# Patient Record
Sex: Female | Born: 1940
Health system: Southern US, Community
[De-identification: ages and names within clinical notes are randomized; demographics above are authoritative.]

## PROBLEM LIST (undated history)

## (undated) DIAGNOSIS — M1990 Unspecified osteoarthritis, unspecified site: Secondary | ICD-10-CM

## (undated) DIAGNOSIS — I10 Essential (primary) hypertension: Secondary | ICD-10-CM

## (undated) DIAGNOSIS — Z5189 Encounter for other specified aftercare: Secondary | ICD-10-CM

## (undated) DIAGNOSIS — M797 Fibromyalgia: Secondary | ICD-10-CM

## (undated) DIAGNOSIS — H269 Unspecified cataract: Secondary | ICD-10-CM

## (undated) DIAGNOSIS — N393 Stress incontinence (female) (male): Secondary | ICD-10-CM

## (undated) DIAGNOSIS — D689 Coagulation defect, unspecified: Secondary | ICD-10-CM

## (undated) DIAGNOSIS — J45909 Unspecified asthma, uncomplicated: Secondary | ICD-10-CM

## (undated) DIAGNOSIS — T7840XA Allergy, unspecified, initial encounter: Secondary | ICD-10-CM

## (undated) DIAGNOSIS — R55 Syncope and collapse: Secondary | ICD-10-CM

## (undated) DIAGNOSIS — E7800 Pure hypercholesterolemia, unspecified: Secondary | ICD-10-CM

## (undated) DIAGNOSIS — G588 Other specified mononeuropathies: Secondary | ICD-10-CM

## (undated) DIAGNOSIS — K5730 Diverticulosis of large intestine without perforation or abscess without bleeding: Secondary | ICD-10-CM

## (undated) DIAGNOSIS — K219 Gastro-esophageal reflux disease without esophagitis: Secondary | ICD-10-CM

## (undated) DIAGNOSIS — N390 Urinary tract infection, site not specified: Secondary | ICD-10-CM

## (undated) DIAGNOSIS — J984 Other disorders of lung: Secondary | ICD-10-CM

## (undated) DIAGNOSIS — D649 Anemia, unspecified: Secondary | ICD-10-CM

## (undated) DIAGNOSIS — D49519 Neoplasm of unspecified behavior of unspecified kidney: Secondary | ICD-10-CM

## (undated) DIAGNOSIS — I509 Heart failure, unspecified: Secondary | ICD-10-CM

## (undated) DIAGNOSIS — E559 Vitamin D deficiency, unspecified: Secondary | ICD-10-CM

## (undated) DIAGNOSIS — F419 Anxiety disorder, unspecified: Secondary | ICD-10-CM

## (undated) DIAGNOSIS — R911 Solitary pulmonary nodule: Secondary | ICD-10-CM

## (undated) DIAGNOSIS — E669 Obesity, unspecified: Secondary | ICD-10-CM

## (undated) DIAGNOSIS — I2510 Atherosclerotic heart disease of native coronary artery without angina pectoris: Secondary | ICD-10-CM

## (undated) DIAGNOSIS — R3129 Other microscopic hematuria: Secondary | ICD-10-CM

## (undated) DIAGNOSIS — D126 Benign neoplasm of colon, unspecified: Secondary | ICD-10-CM

## (undated) DIAGNOSIS — K5150 Left sided colitis without complications: Secondary | ICD-10-CM

## (undated) HISTORY — DX: Unspecified cataract: H26.9

## (undated) HISTORY — DX: Obesity, unspecified: E66.9

## (undated) HISTORY — DX: Anxiety disorder, unspecified: F41.9

## (undated) HISTORY — DX: Solitary pulmonary nodule: R91.1

## (undated) HISTORY — DX: Essential (primary) hypertension: I10

## (undated) HISTORY — DX: Encounter for other specified aftercare: Z51.89

## (undated) HISTORY — DX: Unspecified asthma, uncomplicated: J45.909

## (undated) HISTORY — DX: Stress incontinence (female) (male): N39.3

## (undated) HISTORY — PX: COLONOSCOPY: SHX174

## (undated) HISTORY — DX: Atherosclerotic heart disease of native coronary artery without angina pectoris: I25.10

## (undated) HISTORY — DX: Diverticulosis of large intestine without perforation or abscess without bleeding: K57.30

## (undated) HISTORY — DX: Coagulation defect, unspecified: D68.9

## (undated) HISTORY — DX: Unspecified osteoarthritis, unspecified site: M19.90

## (undated) HISTORY — DX: Other microscopic hematuria: R31.29

## (undated) HISTORY — DX: Allergy, unspecified, initial encounter: T78.40XA

## (undated) HISTORY — PX: POLYPECTOMY: SHX149

## (undated) HISTORY — DX: Pure hypercholesterolemia, unspecified: E78.00

## (undated) HISTORY — DX: Benign neoplasm of colon, unspecified: D12.6

## (undated) HISTORY — DX: Other disorders of lung: J98.4

## (undated) HISTORY — DX: Gastro-esophageal reflux disease without esophagitis: K21.9

## (undated) HISTORY — DX: Vitamin D deficiency, unspecified: E55.9

## (undated) HISTORY — PX: ABDOMINAL HYSTERECTOMY: SHX81

## (undated) HISTORY — PX: UPPER GASTROINTESTINAL ENDOSCOPY: SHX188

## (undated) HISTORY — DX: Left sided colitis without complications: K51.50

## (undated) HISTORY — DX: Other specified mononeuropathies: G58.8

## (undated) HISTORY — DX: Anemia, unspecified: D64.9

## (undated) HISTORY — DX: Heart failure, unspecified: I50.9

## (undated) HISTORY — DX: Fibromyalgia: M79.7

## (undated) HISTORY — PX: CHOLECYSTECTOMY: SHX55

## (undated) HISTORY — DX: Neoplasm of unspecified behavior of unspecified kidney: D49.519

---

## 1998-06-24 ENCOUNTER — Other Ambulatory Visit: Admission: RE | Admit: 1998-06-24 | Discharge: 1998-06-24 | Payer: Self-pay | Admitting: Obstetrics and Gynecology

## 1999-03-01 ENCOUNTER — Encounter (INDEPENDENT_AMBULATORY_CARE_PROVIDER_SITE_OTHER): Payer: Self-pay | Admitting: Specialist

## 1999-03-01 ENCOUNTER — Other Ambulatory Visit: Admission: RE | Admit: 1999-03-01 | Discharge: 1999-03-01 | Payer: Self-pay | Admitting: Gastroenterology

## 1999-03-24 ENCOUNTER — Encounter: Payer: Self-pay | Admitting: Surgery

## 1999-03-29 ENCOUNTER — Observation Stay (HOSPITAL_COMMUNITY): Admission: RE | Admit: 1999-03-29 | Discharge: 1999-03-30 | Payer: Self-pay | Admitting: Surgery

## 1999-03-29 ENCOUNTER — Encounter (INDEPENDENT_AMBULATORY_CARE_PROVIDER_SITE_OTHER): Payer: Self-pay

## 1999-08-07 ENCOUNTER — Other Ambulatory Visit: Admission: RE | Admit: 1999-08-07 | Discharge: 1999-08-07 | Payer: Self-pay | Admitting: Obstetrics and Gynecology

## 2000-04-30 HISTORY — PX: CORONARY STENT PLACEMENT: SHX1402

## 2000-08-21 ENCOUNTER — Ambulatory Visit (HOSPITAL_COMMUNITY): Admission: RE | Admit: 2000-08-21 | Discharge: 2000-08-21 | Payer: Self-pay | Admitting: Orthopedic Surgery

## 2000-08-21 ENCOUNTER — Encounter: Payer: Self-pay | Admitting: Orthopedic Surgery

## 2000-09-05 ENCOUNTER — Encounter: Payer: Self-pay | Admitting: Orthopedic Surgery

## 2000-09-05 ENCOUNTER — Encounter: Admission: RE | Admit: 2000-09-05 | Discharge: 2000-09-05 | Payer: Self-pay | Admitting: Orthopedic Surgery

## 2000-09-18 ENCOUNTER — Encounter: Payer: Self-pay | Admitting: Orthopedic Surgery

## 2000-09-18 ENCOUNTER — Encounter: Admission: RE | Admit: 2000-09-18 | Discharge: 2000-09-18 | Payer: Self-pay | Admitting: Orthopedic Surgery

## 2000-10-01 ENCOUNTER — Encounter: Admission: RE | Admit: 2000-10-01 | Discharge: 2000-10-01 | Payer: Self-pay | Admitting: Orthopedic Surgery

## 2000-10-01 ENCOUNTER — Encounter: Payer: Self-pay | Admitting: Orthopedic Surgery

## 2000-11-09 ENCOUNTER — Encounter: Payer: Self-pay | Admitting: Orthopedic Surgery

## 2000-11-09 ENCOUNTER — Ambulatory Visit (HOSPITAL_COMMUNITY): Admission: RE | Admit: 2000-11-09 | Discharge: 2000-11-09 | Payer: Self-pay | Admitting: Orthopedic Surgery

## 2001-01-15 ENCOUNTER — Encounter: Payer: Self-pay | Admitting: Orthopedic Surgery

## 2001-01-16 ENCOUNTER — Ambulatory Visit (HOSPITAL_COMMUNITY): Admission: RE | Admit: 2001-01-16 | Discharge: 2001-01-16 | Payer: Self-pay | Admitting: Orthopedic Surgery

## 2001-01-31 ENCOUNTER — Ambulatory Visit (HOSPITAL_COMMUNITY): Admission: RE | Admit: 2001-01-31 | Discharge: 2001-02-01 | Payer: Self-pay | Admitting: Cardiology

## 2001-02-07 ENCOUNTER — Inpatient Hospital Stay (HOSPITAL_COMMUNITY): Admission: AD | Admit: 2001-02-07 | Discharge: 2001-02-11 | Payer: Self-pay | Admitting: Cardiology

## 2002-01-05 ENCOUNTER — Encounter: Payer: Self-pay | Admitting: Internal Medicine

## 2002-01-05 ENCOUNTER — Inpatient Hospital Stay (HOSPITAL_COMMUNITY): Admission: EM | Admit: 2002-01-05 | Discharge: 2002-01-06 | Payer: Self-pay | Admitting: Emergency Medicine

## 2002-01-06 ENCOUNTER — Encounter: Payer: Self-pay | Admitting: Internal Medicine

## 2002-08-17 ENCOUNTER — Encounter: Payer: Self-pay | Admitting: Pulmonary Disease

## 2002-08-17 ENCOUNTER — Ambulatory Visit (HOSPITAL_COMMUNITY): Admission: RE | Admit: 2002-08-17 | Discharge: 2002-08-17 | Payer: Self-pay | Admitting: Pulmonary Disease

## 2002-09-30 ENCOUNTER — Inpatient Hospital Stay (HOSPITAL_COMMUNITY): Admission: AD | Admit: 2002-09-30 | Discharge: 2002-10-02 | Payer: Self-pay | Admitting: Cardiology

## 2003-05-04 ENCOUNTER — Inpatient Hospital Stay (HOSPITAL_COMMUNITY): Admission: RE | Admit: 2003-05-04 | Discharge: 2003-05-05 | Payer: Self-pay | Admitting: *Deleted

## 2003-07-23 ENCOUNTER — Emergency Department (HOSPITAL_COMMUNITY): Admission: EM | Admit: 2003-07-23 | Discharge: 2003-07-23 | Payer: Self-pay | Admitting: Emergency Medicine

## 2003-08-01 ENCOUNTER — Inpatient Hospital Stay (HOSPITAL_COMMUNITY): Admission: EM | Admit: 2003-08-01 | Discharge: 2003-08-09 | Payer: Self-pay | Admitting: Emergency Medicine

## 2003-11-24 ENCOUNTER — Inpatient Hospital Stay (HOSPITAL_COMMUNITY): Admission: EM | Admit: 2003-11-24 | Discharge: 2003-11-26 | Payer: Self-pay | Admitting: Emergency Medicine

## 2004-03-01 ENCOUNTER — Ambulatory Visit: Payer: Self-pay | Admitting: Pulmonary Disease

## 2004-03-28 ENCOUNTER — Ambulatory Visit: Payer: Self-pay | Admitting: Pulmonary Disease

## 2004-03-29 ENCOUNTER — Ambulatory Visit: Payer: Self-pay | Admitting: Cardiology

## 2004-04-10 ENCOUNTER — Ambulatory Visit: Payer: Self-pay | Admitting: Gastroenterology

## 2004-04-18 ENCOUNTER — Ambulatory Visit: Payer: Self-pay | Admitting: Gastroenterology

## 2004-05-25 ENCOUNTER — Ambulatory Visit: Payer: Self-pay | Admitting: Pulmonary Disease

## 2004-08-24 ENCOUNTER — Emergency Department (HOSPITAL_COMMUNITY): Admission: EM | Admit: 2004-08-24 | Discharge: 2004-08-24 | Payer: Self-pay | Admitting: Emergency Medicine

## 2004-08-25 ENCOUNTER — Ambulatory Visit: Payer: Self-pay | Admitting: Internal Medicine

## 2004-09-22 ENCOUNTER — Ambulatory Visit: Payer: Self-pay | Admitting: Pulmonary Disease

## 2005-01-22 ENCOUNTER — Ambulatory Visit: Payer: Self-pay | Admitting: Pulmonary Disease

## 2005-03-08 ENCOUNTER — Ambulatory Visit: Payer: Self-pay | Admitting: Cardiology

## 2005-03-08 ENCOUNTER — Emergency Department (HOSPITAL_COMMUNITY): Admission: EM | Admit: 2005-03-08 | Discharge: 2005-03-08 | Payer: Self-pay | Admitting: Emergency Medicine

## 2005-04-03 ENCOUNTER — Ambulatory Visit: Payer: Self-pay | Admitting: Cardiology

## 2005-05-23 ENCOUNTER — Ambulatory Visit: Payer: Self-pay | Admitting: Pulmonary Disease

## 2005-06-12 ENCOUNTER — Ambulatory Visit: Payer: Self-pay | Admitting: Cardiovascular Disease

## 2005-06-12 ENCOUNTER — Inpatient Hospital Stay (HOSPITAL_COMMUNITY): Admission: EM | Admit: 2005-06-12 | Discharge: 2005-06-14 | Payer: Self-pay | Admitting: Emergency Medicine

## 2005-06-22 ENCOUNTER — Ambulatory Visit: Payer: Self-pay | Admitting: Cardiology

## 2005-10-02 ENCOUNTER — Ambulatory Visit: Payer: Self-pay | Admitting: Pulmonary Disease

## 2005-10-12 ENCOUNTER — Ambulatory Visit: Payer: Self-pay | Admitting: Cardiology

## 2005-12-26 ENCOUNTER — Ambulatory Visit: Payer: Self-pay | Admitting: Gastroenterology

## 2006-01-08 ENCOUNTER — Ambulatory Visit: Payer: Self-pay | Admitting: Gastroenterology

## 2006-01-28 HISTORY — PX: HAND SURGERY: SHX662

## 2006-02-11 ENCOUNTER — Ambulatory Visit: Payer: Self-pay | Admitting: Pulmonary Disease

## 2006-02-14 ENCOUNTER — Encounter: Admission: RE | Admit: 2006-02-14 | Discharge: 2006-05-01 | Payer: Self-pay | Admitting: Orthopedic Surgery

## 2006-02-28 DIAGNOSIS — D126 Benign neoplasm of colon, unspecified: Secondary | ICD-10-CM

## 2006-02-28 HISTORY — DX: Benign neoplasm of colon, unspecified: D12.6

## 2006-03-25 ENCOUNTER — Ambulatory Visit: Payer: Self-pay | Admitting: Gastroenterology

## 2006-03-25 ENCOUNTER — Encounter (INDEPENDENT_AMBULATORY_CARE_PROVIDER_SITE_OTHER): Payer: Self-pay | Admitting: *Deleted

## 2006-04-16 ENCOUNTER — Ambulatory Visit: Payer: Self-pay | Admitting: Cardiology

## 2006-04-29 ENCOUNTER — Ambulatory Visit: Payer: Self-pay | Admitting: Pulmonary Disease

## 2006-04-30 DIAGNOSIS — K5150 Left sided colitis without complications: Secondary | ICD-10-CM

## 2006-04-30 HISTORY — DX: Left sided colitis without complications: K51.50

## 2006-06-27 ENCOUNTER — Ambulatory Visit: Payer: Self-pay | Admitting: Gastroenterology

## 2006-06-28 ENCOUNTER — Encounter: Payer: Self-pay | Admitting: Gastroenterology

## 2006-07-15 ENCOUNTER — Ambulatory Visit: Payer: Self-pay | Admitting: Gastroenterology

## 2006-08-13 ENCOUNTER — Ambulatory Visit: Payer: Self-pay | Admitting: Pulmonary Disease

## 2006-08-13 LAB — CONVERTED CEMR LAB
ALT: 8 units/L (ref 0–40)
Alkaline Phosphatase: 54 units/L (ref 39–117)
BUN: 10 mg/dL (ref 6–23)
Basophils Relative: 0.2 % (ref 0.0–1.0)
Bilirubin, Direct: 0.1 mg/dL (ref 0.0–0.3)
CO2: 31 meq/L (ref 19–32)
Calcium: 9.5 mg/dL (ref 8.4–10.5)
Cholesterol: 119 mg/dL (ref 0–200)
Eosinophils Absolute: 0.4 10*3/uL (ref 0.0–0.6)
GFR calc Af Amer: 207 mL/min
GFR calc non Af Amer: 171 mL/min
Glucose, Bld: 106 mg/dL — ABNORMAL HIGH (ref 70–99)
HDL: 55.9 mg/dL (ref >39.0)
Hemoglobin: 12.2 g/dL (ref 12.0–15.0)
Lymphocytes Relative: 26.8 % (ref 12.0–46.0)
MCV: 92.3 fL (ref 78.0–100.0)
Monocytes Absolute: 0.6 10*3/uL (ref 0.2–0.7)
Monocytes Relative: 6.5 % (ref 3.0–11.0)
Neutro Abs: 5.6 10*3/uL (ref 1.4–7.7)
Platelets: 521 10*3/uL — ABNORMAL HIGH (ref 150–400)
Potassium: 3.6 meq/L (ref 3.5–5.1)
Pro B Natriuretic peptide (BNP): 16 pg/mL (ref 0.0–100.0)
TSH: 0.82 microintl units/mL (ref 0.35–5.50)
Total Protein: 7.4 g/dL (ref 6.0–8.3)
Triglycerides: 58 mg/dL (ref 0–149)

## 2006-08-26 ENCOUNTER — Ambulatory Visit: Payer: Self-pay | Admitting: Gastroenterology

## 2006-09-03 ENCOUNTER — Inpatient Hospital Stay (HOSPITAL_COMMUNITY): Admission: EM | Admit: 2006-09-03 | Discharge: 2006-09-04 | Payer: Self-pay | Admitting: Emergency Medicine

## 2006-09-03 ENCOUNTER — Ambulatory Visit: Payer: Self-pay | Admitting: Cardiology

## 2006-09-13 ENCOUNTER — Ambulatory Visit: Payer: Self-pay

## 2006-09-18 ENCOUNTER — Ambulatory Visit: Payer: Self-pay | Admitting: Cardiology

## 2006-09-30 ENCOUNTER — Ambulatory Visit: Payer: Self-pay | Admitting: Pulmonary Disease

## 2006-11-04 ENCOUNTER — Ambulatory Visit: Payer: Self-pay | Admitting: Pulmonary Disease

## 2006-11-04 LAB — CONVERTED CEMR LAB
ALT: 7 units/L (ref 0–35)
AST: 14 units/L (ref 0–37)
Alkaline Phosphatase: 58 units/L (ref 39–117)
BUN: 8 mg/dL (ref 6–23)
Basophils Relative: 0.5 % (ref 0.0–1.0)
CO2: 31 meq/L (ref 19–32)
Calcium: 9.1 mg/dL (ref 8.4–10.5)
Chloride: 108 meq/L (ref 96–112)
Creatinine, Ser: 0.4 mg/dL (ref 0.4–1.2)
Eosinophils Relative: 5 % (ref 0.0–5.0)
Glucose, Bld: 104 mg/dL — ABNORMAL HIGH (ref 70–99)
Monocytes Relative: 6.7 % (ref 3.0–11.0)
Platelets: 526 10*3/uL — ABNORMAL HIGH (ref 150–400)
RDW: 13 % (ref 11.5–14.6)
Saturation Ratios: 14 % — ABNORMAL LOW (ref 20.0–50.0)
Total Bilirubin: 0.6 mg/dL (ref 0.3–1.2)
Total Protein: 6.9 g/dL (ref 6.0–8.3)
WBC: 10.1 10*3/uL (ref 4.5–10.5)

## 2006-11-06 ENCOUNTER — Ambulatory Visit: Payer: Self-pay | Admitting: Cardiology

## 2006-11-08 ENCOUNTER — Ambulatory Visit: Payer: Self-pay | Admitting: Gastroenterology

## 2006-11-11 ENCOUNTER — Ambulatory Visit (HOSPITAL_COMMUNITY): Admission: RE | Admit: 2006-11-11 | Discharge: 2006-11-11 | Payer: Self-pay | Admitting: Gastroenterology

## 2006-11-26 ENCOUNTER — Encounter: Payer: Self-pay | Admitting: Gastroenterology

## 2006-11-26 ENCOUNTER — Ambulatory Visit: Payer: Self-pay | Admitting: Gastroenterology

## 2006-12-13 ENCOUNTER — Ambulatory Visit: Payer: Self-pay | Admitting: Pulmonary Disease

## 2007-01-06 ENCOUNTER — Ambulatory Visit: Payer: Self-pay | Admitting: Gastroenterology

## 2007-03-19 DIAGNOSIS — M1990 Unspecified osteoarthritis, unspecified site: Secondary | ICD-10-CM | POA: Insufficient documentation

## 2007-03-19 DIAGNOSIS — F411 Generalized anxiety disorder: Secondary | ICD-10-CM | POA: Insufficient documentation

## 2007-03-19 DIAGNOSIS — IMO0001 Reserved for inherently not codable concepts without codable children: Secondary | ICD-10-CM | POA: Insufficient documentation

## 2007-03-19 DIAGNOSIS — K219 Gastro-esophageal reflux disease without esophagitis: Secondary | ICD-10-CM | POA: Insufficient documentation

## 2007-03-19 DIAGNOSIS — T7840XA Allergy, unspecified, initial encounter: Secondary | ICD-10-CM | POA: Insufficient documentation

## 2007-03-20 ENCOUNTER — Ambulatory Visit: Payer: Self-pay | Admitting: Pulmonary Disease

## 2007-03-20 LAB — CONVERTED CEMR LAB
ALT: 9 units/L (ref 0–35)
AST: 16 units/L (ref 0–37)
Albumin: 4.3 g/dL (ref 3.5–5.2)
Basophils Absolute: 0 10*3/uL (ref 0.0–0.1)
Calcium: 9.5 mg/dL (ref 8.4–10.5)
Chloride: 105 meq/L (ref 96–112)
Eosinophils Absolute: 0.3 10*3/uL (ref 0.0–0.6)
Eosinophils Relative: 3.3 % (ref 0.0–5.0)
GFR calc non Af Amer: 171 mL/min
Glucose, Bld: 120 mg/dL — ABNORMAL HIGH (ref 70–99)
Hgb A1c MFr Bld: 6.2 % — ABNORMAL HIGH (ref 4.6–6.0)
LDL Cholesterol: 60 mg/dL (ref 0–99)
MCV: 92.6 fL (ref 78.0–100.0)
Platelets: 479 10*3/uL — ABNORMAL HIGH (ref 150–400)
RBC: 3.87 M/uL (ref 3.87–5.11)
RDW: 12.8 % (ref 11.5–14.6)
Total CHOL/HDL Ratio: 2.2
Triglycerides: 58 mg/dL (ref 0–149)
WBC: 10.2 10*3/uL (ref 4.5–10.5)

## 2007-05-09 ENCOUNTER — Ambulatory Visit: Payer: Self-pay | Admitting: Pulmonary Disease

## 2007-05-09 ENCOUNTER — Telehealth: Payer: Self-pay | Admitting: Pulmonary Disease

## 2007-05-09 LAB — CONVERTED CEMR LAB
Ketones, ur: NEGATIVE mg/dL
Mucus, UA: NEGATIVE
Nitrite: NEGATIVE
Urobilinogen, UA: 0.2 (ref 0.0–1.0)
WBC, UA: NONE SEEN cells/hpf
pH: 5.5 (ref 5.0–8.0)

## 2007-05-12 ENCOUNTER — Encounter: Payer: Self-pay | Admitting: Pulmonary Disease

## 2007-05-13 LAB — CONVERTED CEMR LAB
Leukocytes, UA: NEGATIVE
Specific Gravity, Urine: 1.02 (ref 1.000–1.03)
Total Protein, Urine: NEGATIVE mg/dL
Urobilinogen, UA: 0.2 (ref 0.0–1.0)
pH: 5.5 (ref 5.0–8.0)

## 2007-05-27 ENCOUNTER — Ambulatory Visit: Payer: Self-pay | Admitting: Cardiology

## 2007-06-06 ENCOUNTER — Ambulatory Visit: Payer: Self-pay | Admitting: Cardiology

## 2007-06-06 LAB — CONVERTED CEMR LAB
BUN: 7 mg/dL (ref 6–23)
Calcium: 9.3 mg/dL (ref 8.4–10.5)
GFR calc Af Amer: 207 mL/min
GFR calc non Af Amer: 171 mL/min
Glucose, Bld: 118 mg/dL — ABNORMAL HIGH (ref 70–99)

## 2007-06-20 ENCOUNTER — Ambulatory Visit: Payer: Self-pay | Admitting: Cardiology

## 2007-06-25 ENCOUNTER — Encounter: Payer: Self-pay | Admitting: Pulmonary Disease

## 2007-07-18 ENCOUNTER — Ambulatory Visit: Payer: Self-pay | Admitting: Pulmonary Disease

## 2007-07-19 DIAGNOSIS — D649 Anemia, unspecified: Secondary | ICD-10-CM | POA: Insufficient documentation

## 2007-07-19 DIAGNOSIS — K5730 Diverticulosis of large intestine without perforation or abscess without bleeding: Secondary | ICD-10-CM | POA: Insufficient documentation

## 2007-07-19 DIAGNOSIS — D126 Benign neoplasm of colon, unspecified: Secondary | ICD-10-CM | POA: Insufficient documentation

## 2007-07-19 DIAGNOSIS — E669 Obesity, unspecified: Secondary | ICD-10-CM | POA: Insufficient documentation

## 2007-07-19 DIAGNOSIS — E119 Type 2 diabetes mellitus without complications: Secondary | ICD-10-CM | POA: Insufficient documentation

## 2007-07-19 DIAGNOSIS — J45909 Unspecified asthma, uncomplicated: Secondary | ICD-10-CM | POA: Insufficient documentation

## 2007-10-13 ENCOUNTER — Ambulatory Visit: Payer: Self-pay | Admitting: Cardiology

## 2007-10-13 ENCOUNTER — Inpatient Hospital Stay (HOSPITAL_COMMUNITY): Admission: EM | Admit: 2007-10-13 | Discharge: 2007-10-15 | Payer: Self-pay | Admitting: Emergency Medicine

## 2007-10-17 ENCOUNTER — Telehealth (INDEPENDENT_AMBULATORY_CARE_PROVIDER_SITE_OTHER): Payer: Self-pay | Admitting: *Deleted

## 2007-11-17 ENCOUNTER — Ambulatory Visit: Payer: Self-pay | Admitting: Pulmonary Disease

## 2007-11-18 ENCOUNTER — Ambulatory Visit: Payer: Self-pay | Admitting: Pulmonary Disease

## 2007-11-23 LAB — CONVERTED CEMR LAB: HDL: 49.2 mg/dL (ref >39.0)

## 2007-12-24 ENCOUNTER — Telehealth: Payer: Self-pay | Admitting: Gastroenterology

## 2007-12-24 ENCOUNTER — Ambulatory Visit: Payer: Self-pay | Admitting: Cardiology

## 2007-12-29 ENCOUNTER — Encounter: Payer: Self-pay | Admitting: Adult Health

## 2007-12-29 ENCOUNTER — Ambulatory Visit: Payer: Self-pay | Admitting: Internal Medicine

## 2007-12-29 LAB — CONVERTED CEMR LAB
Bilirubin Urine: NEGATIVE
Nitrite: NEGATIVE
Specific Gravity, Urine: 1.025 (ref 1.000–1.03)
Total Protein, Urine: NEGATIVE mg/dL
pH: 5.5 (ref 5.0–8.0)

## 2007-12-30 ENCOUNTER — Telehealth: Payer: Self-pay | Admitting: Adult Health

## 2007-12-31 ENCOUNTER — Telehealth: Payer: Self-pay | Admitting: Pulmonary Disease

## 2007-12-31 ENCOUNTER — Encounter: Payer: Self-pay | Admitting: Pulmonary Disease

## 2008-01-07 ENCOUNTER — Ambulatory Visit: Payer: Self-pay | Admitting: Cardiology

## 2008-01-07 LAB — CONVERTED CEMR LAB
CO2: 30 meq/L (ref 19–32)
Chloride: 107 meq/L (ref 96–112)
GFR calc non Af Amer: 132 mL/min
Potassium: 3.4 meq/L — ABNORMAL LOW (ref 3.5–5.1)
Sodium: 142 meq/L (ref 135–145)

## 2008-01-13 ENCOUNTER — Ambulatory Visit (HOSPITAL_COMMUNITY): Admission: RE | Admit: 2008-01-13 | Discharge: 2008-01-13 | Payer: Self-pay | Admitting: Urology

## 2008-01-13 ENCOUNTER — Encounter: Payer: Self-pay | Admitting: Urology

## 2008-01-19 ENCOUNTER — Telehealth: Payer: Self-pay | Admitting: Pulmonary Disease

## 2008-01-20 ENCOUNTER — Ambulatory Visit: Payer: Self-pay | Admitting: Gastroenterology

## 2008-01-23 ENCOUNTER — Encounter: Payer: Self-pay | Admitting: Pulmonary Disease

## 2008-01-28 ENCOUNTER — Encounter: Payer: Self-pay | Admitting: Pulmonary Disease

## 2008-03-09 ENCOUNTER — Ambulatory Visit: Payer: Self-pay | Admitting: Cardiology

## 2008-03-16 ENCOUNTER — Encounter: Payer: Self-pay | Admitting: Pulmonary Disease

## 2008-03-16 ENCOUNTER — Ambulatory Visit: Payer: Self-pay | Admitting: Pulmonary Disease

## 2008-03-16 DIAGNOSIS — D4959 Neoplasm of unspecified behavior of other genitourinary organ: Secondary | ICD-10-CM | POA: Insufficient documentation

## 2008-03-16 DIAGNOSIS — N39498 Other specified urinary incontinence: Secondary | ICD-10-CM | POA: Insufficient documentation

## 2008-03-23 ENCOUNTER — Telehealth (INDEPENDENT_AMBULATORY_CARE_PROVIDER_SITE_OTHER): Payer: Self-pay | Admitting: *Deleted

## 2008-03-26 ENCOUNTER — Telehealth (INDEPENDENT_AMBULATORY_CARE_PROVIDER_SITE_OTHER): Payer: Self-pay | Admitting: *Deleted

## 2008-06-03 ENCOUNTER — Telehealth (INDEPENDENT_AMBULATORY_CARE_PROVIDER_SITE_OTHER): Payer: Self-pay | Admitting: *Deleted

## 2008-06-03 ENCOUNTER — Telehealth: Payer: Self-pay | Admitting: Pulmonary Disease

## 2008-06-04 ENCOUNTER — Encounter: Payer: Self-pay | Admitting: Pulmonary Disease

## 2008-06-16 ENCOUNTER — Telehealth (INDEPENDENT_AMBULATORY_CARE_PROVIDER_SITE_OTHER): Payer: Self-pay | Admitting: *Deleted

## 2008-07-12 ENCOUNTER — Encounter: Payer: Self-pay | Admitting: Pulmonary Disease

## 2008-07-13 ENCOUNTER — Telehealth: Payer: Self-pay | Admitting: Pulmonary Disease

## 2008-07-15 ENCOUNTER — Ambulatory Visit: Payer: Self-pay | Admitting: Cardiology

## 2008-07-15 ENCOUNTER — Inpatient Hospital Stay (HOSPITAL_COMMUNITY): Admission: EM | Admit: 2008-07-15 | Discharge: 2008-07-16 | Payer: Self-pay | Admitting: Emergency Medicine

## 2008-07-15 DIAGNOSIS — J984 Other disorders of lung: Secondary | ICD-10-CM | POA: Insufficient documentation

## 2008-07-22 ENCOUNTER — Ambulatory Visit: Payer: Self-pay | Admitting: Cardiology

## 2008-07-26 ENCOUNTER — Ambulatory Visit: Payer: Self-pay | Admitting: Pulmonary Disease

## 2008-07-29 ENCOUNTER — Ambulatory Visit (HOSPITAL_COMMUNITY): Admission: RE | Admit: 2008-07-29 | Discharge: 2008-07-29 | Payer: Self-pay | Admitting: Pulmonary Disease

## 2008-08-10 DIAGNOSIS — F32A Depression, unspecified: Secondary | ICD-10-CM | POA: Insufficient documentation

## 2008-08-10 DIAGNOSIS — F329 Major depressive disorder, single episode, unspecified: Secondary | ICD-10-CM

## 2008-08-11 ENCOUNTER — Encounter: Payer: Self-pay | Admitting: Cardiology

## 2008-08-11 ENCOUNTER — Ambulatory Visit: Payer: Self-pay | Admitting: Cardiology

## 2008-08-23 ENCOUNTER — Ambulatory Visit: Payer: Self-pay | Admitting: Cardiology

## 2008-08-23 LAB — CONVERTED CEMR LAB
CO2: 31 meq/L (ref 19–32)
Glucose, Bld: 125 mg/dL — ABNORMAL HIGH (ref 70–99)
Potassium: 3.3 meq/L — ABNORMAL LOW (ref 3.5–5.1)
Sodium: 143 meq/L (ref 135–145)

## 2008-08-25 ENCOUNTER — Ambulatory Visit: Payer: Self-pay | Admitting: Cardiology

## 2008-10-12 ENCOUNTER — Telehealth: Payer: Self-pay | Admitting: Pulmonary Disease

## 2008-10-15 ENCOUNTER — Encounter: Payer: Self-pay | Admitting: Pulmonary Disease

## 2008-12-03 ENCOUNTER — Telehealth: Payer: Self-pay | Admitting: Gastroenterology

## 2008-12-06 ENCOUNTER — Ambulatory Visit: Payer: Self-pay | Admitting: Gastroenterology

## 2008-12-07 ENCOUNTER — Encounter: Payer: Self-pay | Admitting: Gastroenterology

## 2008-12-07 LAB — CONVERTED CEMR LAB
ALT: 8 units/L (ref 0–35)
Albumin: 4 g/dL (ref 3.5–5.2)
BUN: 15 mg/dL (ref 6–23)
Basophils Relative: 1 % (ref 0.0–3.0)
CO2: 32 meq/L (ref 19–32)
Calcium: 8.7 mg/dL (ref 8.4–10.5)
Chloride: 105 meq/L (ref 96–112)
Creatinine, Ser: 0.4 mg/dL (ref 0.4–1.2)
Eosinophils Absolute: 0.4 10*3/uL (ref 0.0–0.7)
GFR calc non Af Amer: 205.14 mL/min (ref >60)
Lymphocytes Relative: 21 % (ref 12.0–46.0)
MCHC: 34.1 g/dL (ref 30.0–36.0)
MCV: 92.7 fL (ref 78.0–100.0)
Monocytes Absolute: 0.6 10*3/uL (ref 0.1–1.0)
Neutrophils Relative %: 66.9 % (ref 43.0–77.0)
Platelets: 436 10*3/uL — ABNORMAL HIGH (ref 150.0–400.0)
Potassium: 3.3 meq/L — ABNORMAL LOW (ref 3.5–5.1)
RBC: 3.75 M/uL — ABNORMAL LOW (ref 3.87–5.11)
WBC: 8.9 10*3/uL (ref 4.5–10.5)

## 2008-12-14 ENCOUNTER — Ambulatory Visit: Payer: Self-pay | Admitting: Gastroenterology

## 2008-12-14 LAB — CONVERTED CEMR LAB
CO2: 32 meq/L (ref 19–32)
Calcium: 8.6 mg/dL (ref 8.4–10.5)
Creatinine, Ser: 0.4 mg/dL (ref 0.4–1.2)
Glucose, Bld: 111 mg/dL — ABNORMAL HIGH (ref 70–99)
Potassium: 4 meq/L (ref 3.5–5.1)
Sodium: 142 meq/L (ref 135–145)

## 2008-12-29 HISTORY — PX: ESOPHAGOGASTRODUODENOSCOPY: SHX1529

## 2009-01-06 ENCOUNTER — Encounter: Payer: Self-pay | Admitting: Gastroenterology

## 2009-01-06 ENCOUNTER — Ambulatory Visit: Payer: Self-pay | Admitting: Gastroenterology

## 2009-01-07 ENCOUNTER — Telehealth (INDEPENDENT_AMBULATORY_CARE_PROVIDER_SITE_OTHER): Payer: Self-pay

## 2009-01-07 ENCOUNTER — Telehealth (INDEPENDENT_AMBULATORY_CARE_PROVIDER_SITE_OTHER): Payer: Self-pay | Admitting: *Deleted

## 2009-01-10 ENCOUNTER — Encounter: Payer: Self-pay | Admitting: Gastroenterology

## 2009-01-11 ENCOUNTER — Encounter (INDEPENDENT_AMBULATORY_CARE_PROVIDER_SITE_OTHER): Payer: Self-pay | Admitting: *Deleted

## 2009-01-17 ENCOUNTER — Ambulatory Visit (HOSPITAL_COMMUNITY): Admission: RE | Admit: 2009-01-17 | Discharge: 2009-01-17 | Payer: Self-pay | Admitting: Gastroenterology

## 2009-01-17 ENCOUNTER — Emergency Department (HOSPITAL_COMMUNITY): Admission: EM | Admit: 2009-01-17 | Discharge: 2009-01-17 | Payer: Self-pay | Admitting: Emergency Medicine

## 2009-01-25 ENCOUNTER — Telehealth: Payer: Self-pay | Admitting: Gastroenterology

## 2009-01-26 ENCOUNTER — Ambulatory Visit: Payer: Self-pay | Admitting: Gastroenterology

## 2009-02-21 ENCOUNTER — Encounter: Payer: Self-pay | Admitting: Cardiology

## 2009-02-22 ENCOUNTER — Ambulatory Visit: Payer: Self-pay | Admitting: Cardiology

## 2009-02-28 ENCOUNTER — Ambulatory Visit: Payer: Self-pay | Admitting: Pulmonary Disease

## 2009-02-28 ENCOUNTER — Telehealth (INDEPENDENT_AMBULATORY_CARE_PROVIDER_SITE_OTHER): Payer: Self-pay | Admitting: *Deleted

## 2009-03-09 ENCOUNTER — Ambulatory Visit: Payer: Self-pay | Admitting: Gastroenterology

## 2009-03-10 ENCOUNTER — Encounter: Payer: Self-pay | Admitting: Gastroenterology

## 2009-03-11 ENCOUNTER — Encounter: Payer: Self-pay | Admitting: Gastroenterology

## 2009-03-11 LAB — CONVERTED CEMR LAB
Alkaline Phosphatase: 53 units/L (ref 39–117)
BUN: 12 mg/dL (ref 6–23)
Basophils Relative: 0.6 % (ref 0.0–3.0)
Eosinophils Relative: 3.7 % (ref 0.0–5.0)
Glucose, Bld: 118 mg/dL — ABNORMAL HIGH (ref 70–99)
HCT: 35.3 % — ABNORMAL LOW (ref 36.0–46.0)
IgA: 173 mg/dL (ref 68–378)
Lymphs Abs: 1.8 10*3/uL (ref 0.7–4.0)
MCV: 94 fL (ref 78.0–100.0)
Monocytes Absolute: 0.7 10*3/uL (ref 0.1–1.0)
Neutrophils Relative %: 71.3 % (ref 43.0–77.0)
RBC: 3.75 M/uL — ABNORMAL LOW (ref 3.87–5.11)
TSH: 1.26 microintl units/mL (ref 0.35–5.50)
Total Bilirubin: 0.6 mg/dL (ref 0.3–1.2)
WBC: 10 10*3/uL (ref 4.5–10.5)

## 2009-03-16 ENCOUNTER — Telehealth: Payer: Self-pay | Admitting: Gastroenterology

## 2009-04-11 ENCOUNTER — Telehealth: Payer: Self-pay | Admitting: Gastroenterology

## 2009-04-18 ENCOUNTER — Ambulatory Visit: Payer: Self-pay | Admitting: Gastroenterology

## 2009-05-25 ENCOUNTER — Encounter: Payer: Self-pay | Admitting: Gastroenterology

## 2009-05-27 ENCOUNTER — Encounter: Payer: Self-pay | Admitting: Gastroenterology

## 2009-05-27 ENCOUNTER — Encounter (INDEPENDENT_AMBULATORY_CARE_PROVIDER_SITE_OTHER): Payer: Self-pay | Admitting: *Deleted

## 2009-06-02 ENCOUNTER — Telehealth: Payer: Self-pay | Admitting: Pulmonary Disease

## 2009-06-02 ENCOUNTER — Ambulatory Visit: Payer: Self-pay | Admitting: Pulmonary Disease

## 2009-06-03 ENCOUNTER — Telehealth (INDEPENDENT_AMBULATORY_CARE_PROVIDER_SITE_OTHER): Payer: Self-pay | Admitting: *Deleted

## 2009-06-04 LAB — CONVERTED CEMR LAB
ALT: 13 units/L (ref 0–35)
Albumin: 4.4 g/dL (ref 3.5–5.2)
BUN: 10 mg/dL (ref 6–23)
Basophils Relative: 0.3 % (ref 0.0–3.0)
Chloride: 104 meq/L (ref 96–112)
Cholesterol: 104 mg/dL (ref 0–200)
Creatinine, Ser: 0.4 mg/dL (ref 0.4–1.2)
Eosinophils Relative: 4 % (ref 0.0–5.0)
Glucose, Bld: 108 mg/dL — ABNORMAL HIGH (ref 70–99)
Hgb A1c MFr Bld: 7.1 % — ABNORMAL HIGH (ref 4.6–6.5)
LDL Cholesterol: 40 mg/dL (ref 0–99)
Lymphocytes Relative: 21 % (ref 12.0–46.0)
MCV: 93.8 fL (ref 78.0–100.0)
Monocytes Relative: 4.8 % (ref 3.0–12.0)
Neutrophils Relative %: 69.9 % (ref 43.0–77.0)
Platelets: 523 10*3/uL — ABNORMAL HIGH (ref 150.0–400.0)
Pro B Natriuretic peptide (BNP): 29 pg/mL (ref 0.0–100.0)
RBC: 4.09 M/uL (ref 3.87–5.11)
Saturation Ratios: 17.3 % — ABNORMAL LOW (ref 20.0–50.0)
TSH: 0.91 microintl units/mL (ref 0.35–5.50)
Total Bilirubin: 0.8 mg/dL (ref 0.3–1.2)
Total Protein: 7.9 g/dL (ref 6.0–8.3)
Transferrin: 277.1 mg/dL (ref 212.0–360.0)
Triglycerides: 67 mg/dL (ref 0.0–149.0)
WBC: 10 10*3/uL (ref 4.5–10.5)

## 2009-06-08 ENCOUNTER — Telehealth: Payer: Self-pay | Admitting: Pulmonary Disease

## 2009-06-09 ENCOUNTER — Ambulatory Visit: Payer: Self-pay | Admitting: Pulmonary Disease

## 2009-06-14 ENCOUNTER — Telehealth: Payer: Self-pay | Admitting: Cardiology

## 2009-06-15 ENCOUNTER — Encounter: Payer: Self-pay | Admitting: Cardiology

## 2009-06-16 ENCOUNTER — Ambulatory Visit: Payer: Self-pay | Admitting: Pulmonary Disease

## 2009-06-25 ENCOUNTER — Emergency Department (HOSPITAL_COMMUNITY): Admission: EM | Admit: 2009-06-25 | Discharge: 2009-06-25 | Payer: Self-pay | Admitting: Emergency Medicine

## 2009-07-04 ENCOUNTER — Telehealth: Payer: Self-pay | Admitting: Pulmonary Disease

## 2009-07-07 ENCOUNTER — Telehealth: Payer: Self-pay | Admitting: Cardiology

## 2009-07-14 ENCOUNTER — Ambulatory Visit: Payer: Self-pay | Admitting: Cardiology

## 2009-08-08 ENCOUNTER — Encounter: Payer: Self-pay | Admitting: Pulmonary Disease

## 2009-08-10 ENCOUNTER — Telehealth: Payer: Self-pay | Admitting: Pulmonary Disease

## 2009-08-15 ENCOUNTER — Encounter: Payer: Self-pay | Admitting: Pulmonary Disease

## 2009-09-07 ENCOUNTER — Encounter: Payer: Self-pay | Admitting: Pulmonary Disease

## 2009-11-02 ENCOUNTER — Encounter: Payer: Self-pay | Admitting: Gastroenterology

## 2009-11-29 ENCOUNTER — Ambulatory Visit: Payer: Self-pay | Admitting: Pulmonary Disease

## 2009-12-03 DIAGNOSIS — E559 Vitamin D deficiency, unspecified: Secondary | ICD-10-CM | POA: Insufficient documentation

## 2010-01-04 ENCOUNTER — Telehealth: Payer: Self-pay | Admitting: Gastroenterology

## 2010-01-05 ENCOUNTER — Encounter: Payer: Self-pay | Admitting: Pulmonary Disease

## 2010-02-08 ENCOUNTER — Ambulatory Visit: Payer: Self-pay | Admitting: Gastroenterology

## 2010-02-10 ENCOUNTER — Encounter: Payer: Self-pay | Admitting: Gastroenterology

## 2010-02-10 LAB — CONVERTED CEMR LAB
BUN: 13 mg/dL (ref 6–23)
Creatinine, Ser: 0.5 mg/dL (ref 0.4–1.2)
GFR calc non Af Amer: 144.58 mL/min (ref >60)
Glucose, Bld: 127 mg/dL — ABNORMAL HIGH (ref 70–99)

## 2010-03-27 ENCOUNTER — Encounter: Payer: Self-pay | Admitting: Pulmonary Disease

## 2010-03-30 ENCOUNTER — Encounter: Payer: Self-pay | Admitting: Pulmonary Disease

## 2010-04-26 ENCOUNTER — Telehealth: Payer: Self-pay | Admitting: Pulmonary Disease

## 2010-05-14 ENCOUNTER — Emergency Department (HOSPITAL_COMMUNITY)
Admission: EM | Admit: 2010-05-14 | Discharge: 2010-05-14 | Payer: Self-pay | Source: Home / Self Care | Admitting: Emergency Medicine

## 2010-05-14 ENCOUNTER — Telehealth (INDEPENDENT_AMBULATORY_CARE_PROVIDER_SITE_OTHER): Payer: Self-pay | Admitting: *Deleted

## 2010-05-15 LAB — BASIC METABOLIC PANEL
BUN: 10 mg/dL (ref 6–23)
CO2: 26 mEq/L (ref 19–32)
Calcium: 8.9 mg/dL (ref 8.4–10.5)
Chloride: 103 mEq/L (ref 96–112)
Creatinine, Ser: 0.43 mg/dL (ref 0.4–1.2)
GFR calc Af Amer: >60 mL/min (ref >60)
GFR calc non Af Amer: >60 mL/min (ref >60)
Glucose, Bld: 167 mg/dL — ABNORMAL HIGH (ref 70–99)
Potassium: 3.3 mEq/L — ABNORMAL LOW (ref 3.5–5.1)
Sodium: 139 mEq/L (ref 135–145)

## 2010-05-15 LAB — URINE MICROSCOPIC-ADD ON

## 2010-05-15 LAB — POCT CARDIAC MARKERS
CKMB, poc: <1 ng/mL — ABNORMAL LOW (ref 1.0–8.0)
Myoglobin, poc: 24.7 ng/mL (ref 12–200)
Troponin i, poc: <0.05 ng/mL (ref 0.00–0.09)

## 2010-05-15 LAB — CBC
HCT: 36.3 % (ref 36.0–46.0)
Hemoglobin: 11.7 g/dL — ABNORMAL LOW (ref 12.0–15.0)
MCH: 30.1 pg (ref 26.0–34.0)
MCHC: 32.2 g/dL (ref 30.0–36.0)
MCV: 93.3 fL (ref 78.0–100.0)
Platelets: 436 10*3/uL — ABNORMAL HIGH (ref 150–400)
RBC: 3.89 MIL/uL (ref 3.87–5.11)
RDW: 14.1 % (ref 11.5–15.5)
WBC: 11 10*3/uL — ABNORMAL HIGH (ref 4.0–10.5)

## 2010-05-15 LAB — URINALYSIS, ROUTINE W REFLEX MICROSCOPIC
Bilirubin Urine: NEGATIVE
Hgb urine dipstick: NEGATIVE
Ketones, ur: NEGATIVE mg/dL
Nitrite: NEGATIVE
Protein, ur: NEGATIVE mg/dL
Specific Gravity, Urine: 1.01 (ref 1.005–1.030)
Urine Glucose, Fasting: NEGATIVE mg/dL
Urobilinogen, UA: 0.2 mg/dL (ref 0.0–1.0)
pH: 5.5 (ref 5.0–8.0)

## 2010-05-15 LAB — D-DIMER, QUANTITATIVE: D-Dimer, Quant: 0.39 ug/mL-FEU (ref 0.00–0.48)

## 2010-05-17 ENCOUNTER — Telehealth (INDEPENDENT_AMBULATORY_CARE_PROVIDER_SITE_OTHER): Payer: Self-pay | Admitting: *Deleted

## 2010-05-18 ENCOUNTER — Ambulatory Visit
Admission: RE | Admit: 2010-05-18 | Discharge: 2010-05-18 | Payer: Self-pay | Source: Home / Self Care | Attending: Pulmonary Disease | Admitting: Pulmonary Disease

## 2010-05-21 ENCOUNTER — Encounter: Payer: Self-pay | Admitting: Pulmonary Disease

## 2010-05-22 ENCOUNTER — Ambulatory Visit
Admission: RE | Admit: 2010-05-22 | Discharge: 2010-05-22 | Payer: Self-pay | Source: Home / Self Care | Attending: Pulmonary Disease | Admitting: Pulmonary Disease

## 2010-05-22 ENCOUNTER — Telehealth: Payer: Self-pay | Admitting: Pulmonary Disease

## 2010-06-01 NOTE — Medication Information (Signed)
Summary: [facility]  [facility]   Imported By: Phillis Knack 06/09/2009 11:40:17  _____________________________________________________________________  External Attachment:    Type:   Image     Comment:   External Document

## 2010-06-01 NOTE — Assessment & Plan Note (Signed)
Summary: Acute NP office visit - bronchitis   Copy to:  n/a Primary Provider/Referring Provider:  Teressa Lower, MD  CC:  c/o still SOB, wheezing, prod cough with white mucus that pt feels wheezing has worsened since last OV.  states will finish augmentin today, and has finished the pred taper and still taking the hydromet.Marland Kitchen  History of Present Illness: 70 y/o BF with  mult med problems including CAD, Asthma, HTN, Hyperlipidemia       ~  Jul09:  she is followed for cardiology by [facility] and he saw her in [place]- he increased her LASIX to 69mBid...  She was hosp in Jun09 w/ CP and had cath showing non-obstructive CAD w/ 25-40% lesions in all 3 vessels and prev stent in LAD patent, norm LVF...  Today she is c/o increased pain w/ her Fibromyalgia & DJD... she has Relafen 7532mid + Tramadol Prn + Parafon Prn... the Tramadol makes her feel spaced-out, therefore stopped... [facility] a [facility]...  ~  Nov09:  f/u visit doing OK-  she saw DrStark in Sep09 & stable Ulc Colitis on Lialda 2.4gm/d...  she saw DrWrenn on several occas w/ eval for stress incontinence, dysuria w/ microscopic hematuria-  Cysto w/ bladder bx was neg;  CT Abd + Sonar w/ 1.8cm lesion left upper pole ?etiology- poss tumor vs cyst (min enhancement, solid on sonar w/ min blood flow + 2 sm cysts also seen)... she had f/u DrKatz 11/09- doing well...  ~  July 26, 2008:  she is quite anxious about a 1.4cm nodule in LLL that showed up on Urology CTAbd 07/12/08... subseq developed some CP and was Hsop 3/18-19/10 by Cards- felt to be non-cardiac CP w/ neg EKGs & ENZs- no change in meds...  CT Chest 07/22/08 confirmed 1.4cm peripheral LLL nodule that wasn't present on CT in 2005 & no other lesions, +bronchiectasis in both LLs L>R, mod cardiomegaly...      ** we discussed proceeding w/ PET/CT for further eval...   February 28, 2009--Presents for an acute office visit. Complains of prod cough with  yellow mucus,  wheezing, increased SOB, sweats for last week, worse for 3 days. OTC not helping.    June 09, 2009 --Presents for an acute office visit. Complains of dyspnea, wheezing, dry cough for 2 days. Called in augmentin on day 2/7. Wheezing and cough are keeping her up. OTC not helping. Very tired from cough. She concerned this will turn into pneumonia. Wheezing is worse today. Denies chest pain,  orthopnea, hemoptysis, fever, n/v/d, edema, headache,recent travel  Was exposed to smoke that seemed to start symptoms, Cough is mainly, no signficant discolored mucus.   June 16, 2009 --Presents for 1 week follow up . Seen last week w/ AEAB flare . Tx / augmentin and steroid taper. She does feel some better butr stil cough/wheeizng. Wheezing is keeping her up. She feels tight. Cough is mainly dry now w/ scant clear mucus. Denies chest pain,   orthopnea, hemoptysis, fever, n/v/d, edema, headache,recent travel. Finished prednisone yesterday. Last day of Augmentin today.   Medications Prior to Update: 1)  Flonase 50 Mcg/act  Susp (Fluticasone Propionate) .... 2 Sprays in Each Nostril Two Times A Day... 2)  Singulair 10 Mg  Tabs (Montelukast Sodium) .... Take 1 Tablet By Mouth Once A Day 3)  Mucinex 600 Mg Xr12h-Tab (Guaifenesin) .... Take 2 Tab By Mouth Two Times A Day W/ Plenty of Fluids...Marland KitchenMarland Kitchen)  Albuterol Sulfate (2.5 Mg/80m) 0.083% Nebu (Albuterol Sulfate) .... Use in Nebulizer Up To Three Times Per Day As Needed For Wheezing... 5)  Plavix 75 Mg  Tabs (Clopidogrel Bisulfate) .... Take 1 Tablet By Mouth Once A Day 6)  Nitrostat 0.4 Mg Subl (Nitroglycerin) ..Marland Kitchen. 1 Tablet Under Tongue At Onset of Chest Pain; You May Repeat Every 5 Minutes For Up To 3 Doses. 7)  Isosorbide Dinitrate 30 Mg  Tabs (Isosorbide Dinitrate) .... Take 1/2 Tablet Once Daily 8)  Atenolol 50 Mg  Tabs (Atenolol) .... Take 1 Tablet By Mouth Once A Day 9)  Norvasc 10 Mg  Tabs (Amlodipine Besylate) .... Take 1 Tablet By Mouth Once  A Day 10)  Clonidine Hcl 0.1 Mg  Tabs (Clonidine Hcl) ..Marland Kitchen. 1 Tab By Mouth Two Times A Day... 11)  Lasix 40 Mg  Tabs (Furosemide) .... Take 2 Tablets in The Am, and Take 1 Tablet By Mouth in The Pm 12)  Potassium Chloride Crys Cr 20 Meq  Tbcr (Potassium Chloride Crys Cr) .... Take 1 Tab By Mouth Two Times A Day... 13)  Crestor 5 Mg Tabs (Rosuvastatin Calcium) .... Take 1 Tablet By Mouth Once A Day 14)  Metformin Hcl 500 Mg  Tabs (Metformin Hcl) ..Marland Kitchen. 1 Tab By Mouth Two Times A Day... 15)  Prilosec Otc 20 Mg Tbec (Omeprazole Magnesium) .... Take 1 Tab By Mouth Two Times A Day... 16)  Lialda 1.2 Gm Tbec (Mesalamine) .... Take 1 Tab By Mouth Two Times A Day... 17)  Rowasa 4 Gm Kit (Mesalamine-Cleanser) .... Insert 1 Enema Into Rectum At Bedtime 18)  Analpram-Hc 1-2.5 % Crea (Hydrocortisone Ace-Pramoxine) .... Apply A Small Amount To Affected Area  Twice A Day 19)  Nabumetone 750 Mg  Tabs (Nabumetone) .... Take 1 Tab By Mouth Two Times A Day W/ Food... 20)  Parafon Forte Dsc 500 Mg  Tabs (Chlorzoxazone) ..Marland Kitchen. 1 Tab By Mouth 4 Times Per Day As Needed For Muscle Spasm... 21)  Alprazolam 0.5 Mg  Tabs (Alprazolam) .... Take 1 Tablet By Mouth Three Times A Day Not To Exceed 3 Per Day 22)  Hydrocortisone 100 Mg/663mEnem (Hydrocortisone) .... Insert 1 Enema Into Rectum Every Morning. (Use Rowasa Suppositories At Bedtime). Pharmacy-Please D/c Rx For Hydrocortisone At Bedtime! 23)  Cortisporin-Tc 3.06-30-08-0.5 Mg/ml Susp (Neomycin-Colist-Hc-Thonzonium) .... 2 Drops in Affected Ear Twice A Day As Needed 24)  Promethazine Hcl 25 Mg Tabs (Promethazine Hcl) ...Marland Kitchen 1 By Mouth Every 4hrs As Needed For Nausea 25)  Vitamin D (Ergocalciferol) 50000 Unit Caps (Ergocalciferol) .... Take One Capsule By Mouth Once Every Week 26)  Augmentin 875-125 Mg Tabs (Amoxicillin-Pot Clavulanate) .... Take One Tablet By Mouth Two Times A Day Until Gone 27)  Hydromet 5-1.5 Mg/102m5102myrp (Hydrocodone-Homatropine) ....Marland Kitchen1-2 Tsp Every 4-6 Hr As  Needed Cough 28)  Prednisone 10 Mg Tabs (Prednisone) .... 4 Tabs For 2 Days, Then 3 Tabs For 2 Days, 2 Tabs For 2 Days, Then 1 Tab For 2 Days, Then Stop  Current Medications (verified): 1)  Flonase 50 Mcg/act  Susp (Fluticasone Propionate) .... 2 Sprays in Each Nostril Two Times A Day... 2)  Singulair 10 Mg  Tabs (Montelukast Sodium) .... Take 1 Tablet By Mouth Once A Day 3)  Mucinex 600 Mg Xr12h-Tab (Guaifenesin) .... Take 2 Tab By Mouth Two Times A Day W/ Plenty of Fluids... Marland KitchenMarland Kitchen  Albuterol Sulfate (2.5 Mg/3ml102m.083% Nebu (Albuterol Sulfate) .... Use in Nebulizer Up To Three Times Per Day As Needed For Wheezing... 5Marland KitchenMarland Kitchen  Plavix 75 Mg  Tabs (Clopidogrel Bisulfate) .... Take 1 Tablet By Mouth Once A Day 6)  Nitrostat 0.4 Mg Subl (Nitroglycerin) .Marland Kitchen.. 1 Tablet Under Tongue At Onset of Chest Pain; You May Repeat Every 5 Minutes For Up To 3 Doses. 7)  Isosorbide Dinitrate 30 Mg  Tabs (Isosorbide Dinitrate) .... Take 1/2 Tablet Once Daily 8)  Atenolol 50 Mg  Tabs (Atenolol) .... Take 1 Tablet By Mouth Once A Day 9)  Norvasc 10 Mg  Tabs (Amlodipine Besylate) .... Take 1 Tablet By Mouth Once A Day 10)  Clonidine Hcl 0.1 Mg  Tabs (Clonidine Hcl) .Marland Kitchen.. 1 Tab By Mouth Two Times A Day... 11)  Lasix 40 Mg  Tabs (Furosemide) .... Take 2 Tablets in The Am, and Take 1 Tablet By Mouth in The Pm 12)  Potassium Chloride Crys Cr 20 Meq  Tbcr (Potassium Chloride Crys Cr) .... Take 1 Tab By Mouth Two Times A Day... 13)  Crestor 5 Mg Tabs (Rosuvastatin Calcium) .... Take 1 Tablet By Mouth Once A Day 14)  Metformin Hcl 500 Mg  Tabs (Metformin Hcl) .Marland Kitchen.. 1 Tab By Mouth Two Times A Day... 15)  Prilosec Otc 20 Mg Tbec (Omeprazole Magnesium) .... Take 1 Tab By Mouth Two Times A Day... 16)  Lialda 1.2 Gm Tbec (Mesalamine) .... Take 1 Tab By Mouth Two Times A Day... 17)  Rowasa 4 Gm Kit (Mesalamine-Cleanser) .... Insert 1 Enema Into Rectum At Bedtime 18)  Analpram-Hc 1-2.5 % Crea (Hydrocortisone Ace-Pramoxine) .... Apply A Small  Amount To Affected Area  Twice A Day 19)  Nabumetone 750 Mg  Tabs (Nabumetone) .... Take 1 Tab By Mouth Two Times A Day W/ Food... 20)  Parafon Forte Dsc 500 Mg  Tabs (Chlorzoxazone) .Marland Kitchen.. 1 Tab By Mouth 4 Times Per Day As Needed For Muscle Spasm... 21)  Alprazolam 0.5 Mg  Tabs (Alprazolam) .... Take 1 Tablet By Mouth Three Times A Day Not To Exceed 3 Per Day 22)  Hydrocortisone 100 Mg/42m Enem (Hydrocortisone) .... Insert 1 Enema Into Rectum Every Morning. (Use Rowasa Suppositories At Bedtime). Pharmacy-Please D/c Rx For Hydrocortisone At Bedtime! 23)  Cortisporin-Tc 3.06-30-08-0.5 Mg/ml Susp (Neomycin-Colist-Hc-Thonzonium) .... 2 Drops in Affected Ear Twice A Day As Needed 24)  Promethazine Hcl 25 Mg Tabs (Promethazine Hcl) ..Marland Kitchen. 1 By Mouth Every 4hrs As Needed For Nausea 25)  Vitamin D (Ergocalciferol) 50000 Unit Caps (Ergocalciferol) .... Take One Capsule By Mouth Once Every Week 26)  Augmentin 875-125 Mg Tabs (Amoxicillin-Pot Clavulanate) .... Take One Tablet By Mouth Two Times A Day Until Gone 27)  Hydromet 5-1.5 Mg/524mSyrp (Hydrocodone-Homatropine) ...Marland Kitchen 1-2 Tsp Every 4-6 Hr As Needed Cough  Allergies (verified): 1)  ! Codeine 2)  ! Cipro 3)  ! Zithromax (Azithromycin) 4)  ! Tramadol Hcl (Tramadol Hcl) 5)  ! Levaquin (Levofloxacin) 6)  ! Asa 7)  ! Tylox  Past History:  Family History: Last updated: 0304-19-2010ather died age 68/ ASHD/ MI, pneumonia, arthritis Mother alive age 68/ severe parkinson's disease 2 Siblings- Bro died w/ sarcoid, DM Sis, GlTami Linage 2826as HBP, Chol, DM No FH of Colon Cancer, but father had colon polyps  Social History: Last updated: [id number] ex-smoker: smoked for 10-1576yrp to 2ppd, quit 1975 social alcohol not exercising retired  Risk Factors: Exercise: yes (01/20/2008)  Risk Factors: Smoking Status: quit (03/16/2008)  Review of Systems      See HPI  Vital Signs:  Patient profile:   68 58ar old  female Height:       62 inches Weight:      203.13 pounds O2 Sat:      95 % on Room air Temp:     99.0 degrees F oral Pulse rate:   84 / minute BP sitting:   122 / 82  (right arm) Cuff size:   large  Vitals Entered By: Parke Poisson CNA (June 16, 2009 11:12 AM)  O2 Flow:  Room air CC: c/o still SOB, wheezing, prod cough with white mucus that pt feels wheezing has worsened since last OV.  states will finish augmentin today, has finished the pred taper and still taking the hydromet. Is Patient Diabetic? Yes Comments Medications reviewed with patient Daytime contact number verified with patient. Parke Poisson CNA  June 16, 2009 11:11 AM    Physical Exam  Additional Exam:  WD, Obese, 70 y/o BF in NAD... GENERAL:  Alert & oriented; pleasant & cooperative... HEENT:  /AT, EOM-wnl, PERRLA, EACs-clear, TMs-wnl, NOSE-clear, THROAT-clear & wnl. NECK:  Supple w/ fairROM; no JVD; normal carotid impulses w/o bruits; no thyromegaly or nodules palpated; no lymphadenopathy. CHEST:  Coarse BS w/ faint exp wheezing ... +trigger points along trapezius area and chest wall. HEART:  Regular Rhythm; without murmurs/ rubs/ or gallops heard... ABDOMEN:  Obese, soft & nontender; normal bowel sounds; no organomegaly or masses detected. EXT: without deformities, mild arthritic changes; no varicose veins/ +venous insuffic/ tr edema. NEURO:  CN's intact;  no focal neuro deficits...     Impression & Recommendations:  Problem # 1:  BRONCHITIS, ACUTE (ICD-466.0) Slow to resolve exacerbation  REC:  Extend Prednisone for next week.  Begin Symbicort 160/4.21mg 2 puffs two times a day  Mucinex DM two times a day as needed cough/congestion Increase fluids  Hydromet as needed cough, may make you sleepy.  Please contact office for sooner follow up if symptoms do not improve or worsen  follow up Dr. NLenna Gilfordas scheduled.   Her updated medication list for this problem includes:    Singulair 10 Mg Tabs (Montelukast sodium)  ..Marland Kitchen.. Take 1 tablet by mouth once a day    Mucinex 600 Mg Xr12h-tab (Guaifenesin) ..Marland Kitchen.. Take 2 tab by mouth two times a day w/ plenty of fluids...    Albuterol Sulfate (2.5 Mg/370m 0.083% Nebu (Albuterol sulfate) ..... Use in nebulizer up to three times per day as needed for wheezing...    Augmentin 875-125 Mg Tabs (Amoxicillin-pot clavulanate) ...Marland Kitchen. Take one tablet by mouth two times a day until gone    Hydromet 5-1.5 Mg/69m64myrp (Hydrocodone-homatropine) ....Marland Kitchen 1-2 tsp every 4-6 hr as needed cough  Orders: Est. Patient Level IV ([id number] Added to Medication List This Visit: 1)  Prednisone 10 Mg Tabs (Prednisone) .... 4 tabs for 3 days, then 3 tabs for 3 days, 2 tabs for 3 days, then 1 tab for 3  days, then stop  Complete Medication List: 1)  Flonase 50 Mcg/act Susp (Fluticasone propionate) .... 2 sprays in each nostril two times a day... 2)  Singulair 10 Mg Tabs (Montelukast sodium) .... Take 1 tablet by mouth once a day 3)  Mucinex 600 Mg Xr12h-tab (Guaifenesin) .... Take 2 tab by mouth two times a day w/ plenty of fluids... Marland KitchenMarland Kitchen  Albuterol Sulfate (2.5 Mg/3ml47m.083% Nebu (Albuterol sulfate) .... Use in nebulizer up to three times per day as needed for wheezing... 5)  Plavix 75 Mg Tabs (Clopidogrel bisulfate) .... Take 1 tablet by mouth once a day 6)  Nitrostat 0.4 Mg Subl (Nitroglycerin) .Marland Kitchen.. 1 tablet under tongue at onset of chest pain; you may repeat every 5 minutes for up to 3 doses. 7)  Isosorbide Dinitrate 30 Mg Tabs (Isosorbide dinitrate) .... Take 1/2 tablet once daily 8)  Atenolol 50 Mg Tabs (Atenolol) .... Take 1 tablet by mouth once a day 9)  Norvasc 10 Mg Tabs (Amlodipine besylate) .... Take 1 tablet by mouth once a day 10)  Clonidine Hcl 0.1 Mg Tabs (Clonidine hcl) .Marland Kitchen.. 1 tab by mouth two times a day... 11)  Lasix 40 Mg Tabs (Furosemide) .... Take 2 tablets in the am, and take 1 tablet by mouth in the pm 12)  Potassium Chloride Crys Cr 20 Meq Tbcr (Potassium chloride  crys cr) .... Take 1 tab by mouth two times a day... 13)  Crestor 5 Mg Tabs (Rosuvastatin calcium) .... Take 1 tablet by mouth once a day 14)  Metformin Hcl 500 Mg Tabs (Metformin hcl) .Marland Kitchen.. 1 tab by mouth two times a day... 15)  Prilosec Otc 20 Mg Tbec (Omeprazole magnesium) .... Take 1 tab by mouth two times a day... 16)  Lialda 1.2 Gm Tbec (Mesalamine) .... Take 1 tab by mouth two times a day... 17)  Rowasa 4 Gm Kit (Mesalamine-cleanser) .... Insert 1 enema into rectum at bedtime 18)  Analpram-hc 1-2.5 % Crea (Hydrocortisone ace-pramoxine) .... Apply a small amount to affected area  twice a day 19)  Nabumetone 750 Mg Tabs (Nabumetone) .... Take 1 tab by mouth two times a day w/ food... 20)  Parafon Forte Dsc 500 Mg Tabs (Chlorzoxazone) .Marland Kitchen.. 1 tab by mouth 4 times per day as needed for muscle spasm... 21)  Alprazolam 0.5 Mg Tabs (Alprazolam) .... Take 1 tablet by mouth three times a day not to exceed 3 per day 22)  Hydrocortisone 100 Mg/44m Enem (Hydrocortisone) .... Insert 1 enema into rectum every morning. (use rowasa suppositories at bedtime). pharmacy-please d/c rx for hydrocortisone at bedtime! 23)  Cortisporin-tc 3.06-30-08-0.5 Mg/ml Susp (Neomycin-colist-hc-thonzonium) .... 2 drops in affected ear twice a day as needed 24)  Promethazine Hcl 25 Mg Tabs (Promethazine hcl) ..Marland Kitchen. 1 by mouth every 4hrs as needed for nausea 25)  Vitamin D (ergocalciferol) 50000 Unit Caps (Ergocalciferol) .... Take one capsule by mouth once every week 26)  Augmentin 875-125 Mg Tabs (Amoxicillin-pot clavulanate) .... Take one tablet by mouth two times a day until gone 27)  Hydromet 5-1.5 Mg/536mSyrp (Hydrocodone-homatropine) ...Marland Kitchen 1-2 tsp every 4-6 hr as needed cough 28)  Prednisone 10 Mg Tabs (Prednisone) .... 4 tabs for 3 days, then 3 tabs for 3 days, 2 tabs for 3 days, then 1 tab for 3  days, then stop  Patient Instructions: 1)  Extend Prednisone for next week.  2)  Begin Symbicort 160/4.81m73m2 puffs two times a  day  3)  Mucinex DM two times a day as needed cough/congestion 4)  Increase fluids  5)  Hydromet as needed cough, may make you sleepy.  6)  Please contact office for sooner follow up if symptoms do not improve or worsen  7)  follow up Dr. NadLenna Gilford scheduled.  Prescriptions: PREDNISONE 10 MG TABS (PREDNISONE) 4 tabs for 3 days, then 3 tabs for 3 days, 2 tabs for 3 days, then 1 tab for 3  days, then stop  #30 x 0   Entered and Authorized by:   TamRexene Edison   Signed by:   Gerritt Galentine NP on 06/16/2009   Method used:  Electronically to        [facility] [phone]* (retail)       [address]       [place], [place]  [address]       Ph: [phone] or [phone]       Fax: [phone]   RxID:   [id number]

## 2010-06-01 NOTE — Progress Notes (Signed)
Summary: changing crestor  Phone Note Other Incoming   Summary of Call: received letter from [facility] that pts Crestor 48m is not on their formulary list and she needs to be changed to simvastatin, lovastatin, or lipitor. Called and spoke w/pt she states she was on lipitor 133muntil March 2010 when she was changed to Crestor due to her insurance, will discuss w/Dr KaRon Parkernd let pt know HeKevan RosebushRN  July 07, 2009 4:40 PM   Follow-up for Phone Call        spoke with Ms SpLansdalehe has 4 tablets left; Dr KaRon Parkerill make a decision at her appt on Thurs in regards to her meds ShMignon PineRMPutneyMarch 14, 2011 11:37 AM

## 2010-06-01 NOTE — Assessment & Plan Note (Signed)
Summary: MED REFILLS/YF   History of Present Illness Visit Type: Follow-up Visit Primary GI MD: Joylene Igo MD [facility] Primary Provider: Teressa Lower, MD Requesting Provider: n/a Chief Complaint: chronic diarrhea, med refills History of Present Illness:   [name] returns today complaining of frequent loose bowel movements. She states she will have 3-4 loose bowel movements with small amounts of bleeding about every other day. Imodium is somewhat helpful in controlling her symptoms.   GI Review of Systems    Reports abdominal pain.     Location of  Abdominal pain: lower abdomen.    Denies acid reflux, belching, bloating, chest pain, dysphagia with liquids, dysphagia with solids, heartburn, loss of appetite, nausea, vomiting, vomiting blood, weight loss, and  weight gain.      Reports diarrhea.     Denies anal fissure, black tarry stools, change in bowel habit, constipation, diverticulosis, fecal incontinence, heme positive stool, hemorrhoids, irritable bowel syndrome, jaundice, light color stool, liver problems, rectal bleeding, and  rectal pain.   Current Medications (verified): 1)  Flonase 50 Mcg/act  Susp (Fluticasone Propionate) .... 2 Sprays in Each Nostril Two Times A Day... 2)  Singulair 10 Mg  Tabs (Montelukast Sodium) .... Take 1 Tablet By Mouth Once A Day 3)  Mucinex 600 Mg Xr12h-Tab (Guaifenesin) .... Take 2 Tab By Mouth Two Times A Day W/ Plenty of Fluids.Marland KitchenMarland Kitchen 4)  Albuterol Sulfate (2.5 Mg/80m) 0.083% Nebu (Albuterol Sulfate) .... Use in Nebulizer Up To Three Times Per Day As Needed For Wheezing... 5)  Plavix 75 Mg  Tabs (Clopidogrel Bisulfate) .... Take 1 Tablet By Mouth Once A Day 6)  Nitrostat 0.4 Mg Subl (Nitroglycerin) ..Marland Kitchen. 1 Tablet Under Tongue At Onset of Chest Pain; You May Repeat Every 5 Minutes For Up To 3 Doses. 7)  Isosorbide Mononitrate Cr 60 Mg Xr24h-Tab (Isosorbide Mononitrate) .... Take 1/2 Tablet By Mouth Daily 8)  Atenolol 50 Mg  Tabs (Atenolol) .... Take  1 Tablet By Mouth Once A Day 9)  Norvasc 10 Mg  Tabs (Amlodipine Besylate) .... Take 1 Tablet By Mouth Once A Day 10)  Clonidine Hcl 0.1 Mg  Tabs (Clonidine Hcl) ..Marland Kitchen. 1 Tab By Mouth Two Times A Day... 11)  Lasix 40 Mg  Tabs (Furosemide) .... Take 2 Tablets in The Am, and Take 1 Tablet By Mouth in The Pm 12)  Potassium Chloride Crys Cr 20 Meq  Tbcr (Potassium Chloride Crys Cr) .... Take 1 Tab By Mouth Two Times A Day... 13)  Lipitor 10 Mg Tabs (Atorvastatin Calcium) .... Take One Tablet By Mouth Daily. 14)  Metformin Hcl 500 Mg  Tabs (Metformin Hcl) ..Marland Kitchen. 1 Tab By Mouth Two Times A Day... 15)  Prilosec Otc 20 Mg Tbec (Omeprazole Magnesium) .... Take 1 Tab By Mouth Two Times A Day... 16)  Asacol 400 Mg Tbec (Mesalamine) .... Take 4 Tabs By Mouth Two Times A Day As Directed By DRafael Bihari.. Must Keep Appt For Any Further Refills! 17)  Analpram-Hc 1-2.5 % Crea (Hydrocortisone Ace-Pramoxine) .... Apply A Small Amount To Affected Area  Twice A Day 18)  Nabumetone 750 Mg  Tabs (Nabumetone) .... Take 1 Tab By Mouth Two Times A Day W/ Food... 19)  Parafon Forte Dsc 500 Mg  Tabs (Chlorzoxazone) ..Marland Kitchen. 1 Tab By Mouth 4 Times Per Day As Needed For Muscle Spasm... 20)  Alprazolam 0.5 Mg  Tabs (Alprazolam) .... Take 1 Tablet By Mouth Three Times A Day Not To Exceed 3 Per Day 21)  Promethazine Hcl  25 Mg Tabs (Promethazine Hcl) .Marland Kitchen.. 1 By Mouth Every 4hrs As Needed For Nausea 22)  Hydromet 5-1.5 Mg/59m Syrp (Hydrocodone-Homatropine) ..Marland Kitchen. 1-2 Tsp Every 4-6 Hr As Needed Cough 23)  Vitamin D3 5000 Unit Caps (Cholecalciferol) .... Take One Capsule By Mouth Once Daily...  Allergies (verified): 1)  ! Codeine 2)  ! Cipro 3)  ! Zithromax (Azithromycin) 4)  ! Tramadol Hcl (Tramadol Hcl) 5)  ! Levaquin (Levofloxacin) 6)  ! Asa 7)  ! Tylox  Past History:  Past Medical History: ALLERGY (ICD-995.3) ASTHMA (ICD-493.90) BRONCHIECTASIS (ICD-494.0) PULMONARY NODULE (ICD-518.89) HYPERTENSION (ICD-401.9) CORONARY ARTERY  DISEASE (ICD-414.00) CHEST PAIN, ATYPICAL (ISWF-093.23 DIASTOLIC HEART FAILURE, CHRONIC (ICD-428.32) HYPERCHOLESTEROLEMIA (ICD-272.0) DIABETES MELLITUS (ICD-250.00) OBESITY (ICD-278.00) GERD (ICD-530.81) DIVERTICULOSIS OF COLON (ICD-562.10) Hx of LEFT SIDED ULCERATIVE COLITIS (ICD-556.9), 2008 ADENOMATOUS COLONIC POLYPS (ICD-211.3) 02/2006 MICROSCOPIC HEMATURIA (ICD-599.72) NEOPLASM, KIDNEY (ICD-239.5) STRESS INCONTINENCE (ICD-788.39) DEGENERATIVE JOINT DISEASE (ICD-715.90) FIBROMYALGIA (ICD-729.1) VITAMIN D DEFICIENCY (ICD-268.9) ANXIETY (ICD-300.00) Hx of ANEMIA (ICD-285.9)  Past Surgical History: S/P hysterectomy S/P cholecystectomy S/P right hand surgery 10/07 by Dr. GAmedeo PlentyPTCA-Stent  Family History: Reviewed history from 07/26/2008 and no changes required. Father died age 3761w/ ASHD/ MI, pneumonia, arthritis Mother alive age 3867w/ severe parkinson's disease 2 Siblings- Bro died w/ sarcoid, DM Sis, GTami Lin age 3721has HBP, Chol, DM No FH of Colon Cancer, but father had colon polyps  Social History: Reviewed history from 11/29/2009 and no changes required. Divorced ex-smoker: smoked for 10-171yrup to 2ppd, quit 1975 social alcohol not exercising retired  Review of Systems       The pertinent positives and negatives are noted as above and in the HPI. All other ROS were reviewed and were negative.   Vital Signs:  Patient profile:   [age]ear old female Height:      62 inches Weight:      202 pounds BMI:     37.08 Pulse rate:   84 / minute Pulse rhythm:   regular BP sitting:   114 / 70  (left arm) Cuff size:   regular  Vitals Entered By: June McMurray CMA (ADeborra Medina(February 08, 2010 10:40 AM)  Physical Exam  General:  Well developed, well nourished, no acute distress. Head:  Normocephalic and atraumatic. Eyes:  PERRLA, no icterus. Ears:  Normal auditory acuity. Mouth:  No deformity or lesions, dentition normal. Neck:  Supple; no masses or  thyromegaly. Lungs:  Clear throughout to auscultation. Heart:  Regular rate and rhythm; no murmurs, rubs,  or bruits. Abdomen:  Soft, nontender and nondistended. No masses, hepatosplenomegaly or hernias noted. Normal bowel sounds. Msk:  Symmetrical with no gross deformities. Normal posture. Pulses:  Normal pulses noted. Extremities:  No clubbing, cyanosis, edema or deformities noted. Neurologic:  Alert and  oriented x4;  grossly normal neurologically. Cervical Nodes:  No significant cervical adenopathy. Inguinal Nodes:  No significant inguinal adenopathy. Psych:  Alert and cooperative. Normal mood and affect.  Impression & Recommendations:  Problem # 1:  ULCERATIVE COLITIS-LEFT SIDE (ICD-556.5) Increase Asacol to 1.6 g t.i.d. Begin Canasa suppositories for one month. If her symptoms do not come under adequate control she is advised to call within 4-6 weeks for further management plans. BMET for long-term ASA usage. Last BMET was in 05/2009. Orders: TLB-BMP (Basic Metabolic Panel-BMET) ([id number] Problem # 2:  GERD (ICD-530.81) Continue Prilosec 20 mg twice daily, and standard antireflux measures.  Problem # 3:  PERSONAL HX COLONIC POLYPS (ICD-V12.72) Prior history of adenomatous colon polyp and November  2007. Surveillance colonoscopy in November 2012.  Patient Instructions: 1)  Get your labs drawn today in the basement.  2)  Pick up your prescriptions from your pharmacy.  3)  Please schedule a follow-up appointment in 6 months. 4)  Copy sent to : Teressa Lower, MD 5)  The medication list was reviewed and reconciled.  All changed / newly prescribed medications were explained.  A complete medication list was provided to the patient / caregiver.  Prescriptions: CANASA 1000 MG SUPP (MESALAMINE) one suppository into rectum at bedtime  #30 x 0   Entered by:   Marlon Pel CMA ([place])   Authorized by:   Ladene Artist MD [facility]   Signed by:   Marlon Pel CMA ([place]) on  02/08/2010   Method used:   Electronically to        [facility]. [phone]* (retail)       [place], [place]  [address]       Ph: [phone] or [phone]       Fax: [phone]   RxID:   [id number] ASACOL 400 MG TBEC (MESALAMINE) take 4 tabs by mouth three times a day  #360 x 6   Entered by:   Marlon Pel CMA ([place])   Authorized by:   Ladene Artist MD [facility]   Signed by:   Marlon Pel CMA ([place]) on 02/08/2010   Method used:   Electronically to        [facility]. [phone]* (retail)       [place], [place]  [address]       Ph: [phone] or [phone]       Fax: [phone]   RxID:   [id number]   Appended Document: MED REFILLS/YF    Clinical Lists Changes  Observations: Added new observation of COLONNXTDUE: 03/2011 (02/08/2010 13:12)

## 2010-06-01 NOTE — Progress Notes (Signed)
Summary: ? shingles  Phone Note Call from Patient Call back at [phone]   Caller: Patient Call For: [name] Reason for Call: Talk to Nurse Summary of Call: Patient calling saying see was seen Thurs. by Dr. Lenna Gilford.  She was told to call if she developed a rash, which she has, because she might have shingles.   Initial call taken by: Mateo Flow,  May 22, 2010 8:31 AM  Follow-up for Phone Call        Pt c/o bumps on her left side and abdomen with pain and itching starting 05/19/10. Pt states she is not able to see the bumps.The pain is 8 of 10 with minimum relief taking Vicodin every 6 hours. Please advise. Thanks. Iran Planas CMA  May 22, 2010 10:32 AM  [facility] [address]  Allergies:  1)  ! Codeine 2)  ! Cipro 3)  ! Zithromax (Azithromycin) 4)  ! Tramadol Hcl (Tramadol Hcl) 5)  ! Levaquin (Levofloxacin) 6)  ! Asa 7)  ! Tylox  Additional Follow-up for Phone Call Additional follow up Details #1::        per SN---please have pt come in this afternoon at 1:45 to see SN---we have to look at these bumps to make sure what this is.  thanks [place]  May 22, 2010 10:41 AM     Additional Follow-up for Phone Call Additional follow up Details #2::    Pt informed, appointment scheduled. Iran Planas CMA  May 22, 2010 10:52 AM

## 2010-06-01 NOTE — Progress Notes (Signed)
Summary: PRESCRIPTION  Phone Note Call from Patient Call back at [facility] Phone [phone]   Caller: Patient Call For: DR. NADEL Summary of Call: PATIENT PHONED [place] RETURNED [place] AN ANTIBIOTIC AND SOME MAGIC MOUTH [place] [facility] [address] NOT [facility] NUMBER. PATIENT CAN BE REACHED AT [phone] Initial call taken by: Ozella Rocks,  April 26, 2010 3:44 PM  Follow-up for Phone Call        Spoke with patient; complains of sore throat, cough-non productive,congestin, hoarsenss,blowing out streaks of blood from nose, and sweats(no fever) since Monday 04-24-10; recently went to beach and came back with these symptoms. Pt would like MMW and Augmentin called to pharmacy. Please advise.Clayborne Dana CMA  April 26, 2010 3:51 PM   Additional Follow-up for Phone Call Additional follow up Details #1::        per SN---ok for pt to have augmentin 874m   #14  1 by mouth two times a day until gone and mmw  #4oz   1 tsp gargle and swallow four times daily with 1 refill.  pt is aware that [place] April 26, 2010 4:44 PM     New/Updated Medications: AUGMENTIN 875-125 MG TABS (AMOXICILLIN-POT CLAVULANATE) take one tablet by mouth two times a day until gone * MMW take one tsp gargle and swallow four times daily Prescriptions: MMW take one tsp gargle and swallow four times daily  #4 oz x 1   Entered by:   LElita BooneCMA   Authorized by:   SNoralee SpaceMD   Signed by:   LElita BooneCMA on 04/26/2010   Method used:   Telephoned to ...       [facility]  [address] #[phone] (retail)       [address]  [address]      Ph: [phone]      Fax: [phone]  RxID:   [id number] 875-125 MG TABS (AMOXICILLIN-POT CLAVULANATE) take one tablet by mouth two times a day until gone  #14 x 0   Entered by:   LElita BooneCMA  Authorized by:   SNoralee SpaceMD   Signed by:   LElita BooneCMA on 04/26/2010   Method used:   [facility]ronically to        [facility][phone] (retail)       [address]      [place]  [address]      Ph: [phone] [phone]      Fax: [phone]  RxID:   [id number]

## 2010-06-01 NOTE — Miscellaneous (Signed)
Summary: Changed Asacol HD to Asacol  Clinical Lists Changes  Medications: Changed medication from LIALDA 1.2 GM TBEC (MESALAMINE) take 1 tab by mouth two times a day... to ASACOL 400 MG TBEC (MESALAMINE) 4 tablet by mouth two times a day  Pt needs office visit for any further refills! - Signed Rx of ASACOL 400 MG TBEC (MESALAMINE) 4 tablet by mouth two times a day  Pt needs office visit for any further refills!;  #240 x 0;  Signed;  Entered by: Marlon Pel CMA (AAMA);  Authorized by: Ladene Artist MD [facility];  Method used: Electronically to [facility]*, [address], [place], [place]  [address], Ph: [phone] or [phone], Fax: [phone]    Prescriptions: ASACOL 400 MG TBEC (MESALAMINE) 4 tablet by mouth two times a day  Pt needs office visit for any further refills!  #240 x 0   Entered by:   Marlon Pel CMA ([place])   Authorized by:   Ladene Artist MD [facility]   Signed by:   Marlon Pel CMA ([place]) on 11/02/2009   Method used:   Electronically to        [facility]. [phone]* (retail)       [address]       [place], [place]  [address]       Ph: [phone] or [phone]       Fax: [phone]   RxID:   [id number]

## 2010-06-01 NOTE — Medication Information (Signed)
Summary: [employer]   Imported By: Jamelle Haring 07/29/2009 08:46:45  _____________________________________________________________________  External Attachment:    Type:   Image     Comment:   External Document

## 2010-06-01 NOTE — Medication Information (Signed)
Summary: [place]   Imported By: Bubba Hales 09/13/2009 09:32:08  _____________________________________________________________________  External Attachment:    Type:   Image     Comment:   External Document

## 2010-06-01 NOTE — Progress Notes (Signed)
Summary: refill meds --- crestor  Phone Note Refill Request Call back at Home Phone [phone] Message from:  Patient on June 14, 2009 9:01 AM  Refills Requested: Medication #1:  CRESTOR 5 MG TABS Take 1 tablet by mouth once a day [facility] [address] . pt is out [phone]   Method Requested: Fax to [facility] Initial call taken by: Neil Crouch,  June 14, 2009 9:02 AM  Follow-up for Phone Call        sent Rx -- pt aware Follow-up by: Mignon Pine, RMA,  June 14, 2009 4:34 PM    Prescriptions: CRESTOR 5 MG TABS (ROSUVASTATIN CALCIUM) Take 1 tablet by mouth once a day  #30 x 8   Entered by:   Mignon Pine, RMA   Authorized by:   Lorenza Evangelist, MD, [facility]   Signed by:   Mignon Pine, RMA on 06/14/2009   Method used:   Electronically to        [facility]. [place] (retail)       [address]       [place],   [address]       Ph: [phone] or [phone]       Fax: [phone]   RxID:   [id number]

## 2010-06-01 NOTE — Progress Notes (Signed)
Summary: rx  Phone Note Call from Patient Call back at [phone]   Caller: Patient Call For: Tammy P Reason for Call: Talk to Nurse Summary of Call: need eardrops and phenogran called in.  She was in yesterday and these were not called in for her. [facility] [address] Initial call taken by: Zigmund Gottron,  June 03, 2009 11:23 AM  Follow-up for Phone Call        ok to call in corticosoporin and phenergan 25 mg one every 4 hours #12 Follow-up by: Tanda Rockers MD,  June 03, 2009 12:26 PM    New/Updated Medications: CORTISPORIN-TC 3.06-30-08-0.5 MG/ML SUSP (NEOMYCIN-COLIST-HC-THONZONIUM) 2 drops in affected ear twice a day as needed PROMETHAZINE HCL 25 MG TABS (PROMETHAZINE HCL) 1 by mouth every 4hrs as needed for nausea Prescriptions: PROMETHAZINE HCL 25 MG TABS (PROMETHAZINE HCL) 1 by mouth every 4hrs as needed for nausea  #12 x 0   Entered by:   Maryann Conners CMA   Authorized by:   Noralee Space MD   Signed by:   Maryann Conners CMA on 06/03/2009   Method used:   Electronically to        [facility]. [phone]* (retail)       [place], [place]  [address]       Ph: [phone] or [phone]       Fax: [phone]   RxID:   [id number] CORTISPORIN-TC 3.06-30-08-0.5 MG/ML SUSP (NEOMYCIN-COLIST-HC-THONZONIUM) 2 drops in affected ear twice a day as needed  #1 x 0   Entered by:   Maryann Conners CMA   Authorized by:   Noralee Space MD   Signed by:   Maryann Conners CMA on 06/03/2009   Method used:   [facility]ronically to        [facility]  [address] [phone]* (retail)       [address]       [place], [place]  [address]       Ph: [phone] or [phone]       Fax: [phone]   RxID:   [id number]

## 2010-06-01 NOTE — Assessment & Plan Note (Signed)
Summary: Acute NP office visit   Copy to:  n/a Primary Provider/Referring Provider:  Teressa Lower, MD  CC:  dyspnea, wheezing, dry cough onset yesterday - denies f/c/s, and n/v.  was given augmentin via phone msg yesterday.Marland Kitchen  History of Present Illness: 70 y/o BF with  mult med problems including CAD, Asthma, HTN, Hyperlipidemia       ~  Jul09:  she is followed for cardiology by [facility] and he saw her in [place]- he increased her LASIX to 80mBid...  She was hosp in Jun09 w/ CP and had cath showing non-obstructive CAD w/ 25-40% lesions in all 3 vessels and prev stent in LAD patent, norm LVF...  Today she is c/o increased pain w/ her Fibromyalgia & DJD... she has Relafen 7542mid + Tramadol Prn + Parafon Prn... the Tramadol makes her feel spaced-out, therefore stopped... [facility] a [facility]  ~  Nov09:  f/u visit doing OK-  she saw DrStark in Sep09 & stable Ulc Colitis on Lialda 2.4gm/d...  she saw DrWrenn on several occas w/ eval for stress incontinence, dysuria w/ microscopic hematuria-  Cysto w/ bladder bx was neg;  CT Abd + Sonar w/ 1.8cm lesion left upper pole ?etiology- poss tumor vs cyst (min enhancement, solid on sonar w/ min blood flow + 2 sm cysts also seen)... she had f/u DrKatz 11/09- doing well...  ~  July 26, 2008:  she is quite anxious about a 1.4cm nodule in LLL that showed up on Urology CTAbd 07/12/08... subseq developed some CP and was Hsop 3/18-19/10 by Cards- felt to be non-cardiac CP w/ neg EKGs & ENZs- no change in meds...  CT Chest 07/22/08 confirmed 1.4cm peripheral LLL nodule that wasn't present on CT in 2005 & no other lesions, +bronchiectasis in both LLs L>R, mod cardiomegaly...      ** we discussed proceeding w/ PET/CT for further eval...   February 28, 2009--Presents for an acute office visit. Complains of prod cough with yellow mucus,  wheezing, increased SOB, sweats for last week, worse for 3 days. OTC not helping.     June 09, 2009 --Presents for an acute office visit. Complains of dyspnea, wheezing, dry cough for 2 days. Called in augmentin on day 2/7. Wheezing and cough are keeping her up. OTC not helping. Very tired from cough. She concerned this will turn into pneumonia. Wheezing is worse today. Denies chest pain,  orthopnea, hemoptysis, fever, n/v/d, edema, headache,recent travel  Was exposed to smoke that seemed to start symptoms, Cough is mainly, no signficant discolored mucus.   Medications Prior to Update: 1)  Flonase 50 Mcg/act  Susp (Fluticasone Propionate) .... 2 Sprays in Each Nostril Two Times A Day... 2)  Singulair 10 Mg  Tabs (Montelukast Sodium) .... Take 1 Tablet By Mouth Once A Day 3)  Mucinex 600 Mg Xr12h-Tab (Guaifenesin) .... Take 2 Tab By Mouth Two Times A Day W/ Plenty of Fluids...Marland KitchenMarland Kitchen)  Albuterol Sulfate (2.5 Mg/34m37m0.083% Nebu (Albuterol Sulfate) .... Use in Nebulizer Up To Three Times Per Day As Needed For Wheezing... 5)  Plavix 75 Mg  Tabs (Clopidogrel Bisulfate) .... Take 1 Tablet By Mouth Once A Day 6)  Nitrostat 0.4 Mg Subl (Nitroglycerin) ....Marland Kitchen1 Tablet Under Tongue At Onset of Chest Pain; You May Repeat Every 5 Minutes For Up To 3 Doses. 7)  Isosorbide Dinitrate 30 Mg  Tabs (Isosorbide Dinitrate) .... Take 1/2 Tablet Once Daily 8)  Atenolol  50 Mg  Tabs (Atenolol) .... Take 1 Tablet By Mouth Once A Day 9)  Norvasc 10 Mg  Tabs (Amlodipine Besylate) .... Take 1 Tablet By Mouth Once A Day 10)  Clonidine Hcl 0.1 Mg  Tabs (Clonidine Hcl) .Marland Kitchen.. 1 Tab By Mouth Two Times A Day... 11)  Lasix 40 Mg  Tabs (Furosemide) .... Take 2 Tablets in The Am, and Take 1 Tablet By Mouth in The Pm 12)  Potassium Chloride Crys Cr 20 Meq  Tbcr (Potassium Chloride Crys Cr) .... Take 1 Tab By Mouth Two Times A Day... 13)  Crestor 5 Mg Tabs (Rosuvastatin Calcium) .... Take 1 Tablet By Mouth Once A Day 14)  Metformin Hcl 500 Mg  Tabs (Metformin Hcl) .Marland Kitchen.. 1 Tab By Mouth Two Times A Day... 15)  Prilosec Otc  20 Mg Tbec (Omeprazole Magnesium) .... Take 1 Tab By Mouth Two Times A Day... 16)  Lialda 1.2 Gm Tbec (Mesalamine) .... Take 1 Tab By Mouth Two Times A Day... 17)  Rowasa 4 Gm Kit (Mesalamine-Cleanser) .... Insert 1 Enema Into Rectum At Bedtime 18)  Analpram-Hc 1-2.5 % Crea (Hydrocortisone Ace-Pramoxine) .... Apply A Small Amount To Affected Area  Twice A Day 19)  Nabumetone 750 Mg  Tabs (Nabumetone) .... Take 1 Tab By Mouth Two Times A Day W/ Food... 20)  Parafon Forte Dsc 500 Mg  Tabs (Chlorzoxazone) .Marland Kitchen.. 1 Tab By Mouth 4 Times Per Day As Needed For Muscle Spasm... 21)  Alprazolam 0.5 Mg  Tabs (Alprazolam) .... Take 1 Tablet By Mouth Three Times A Day Not To Exceed 3 Per Day 22)  Hydrocortisone 100 Mg/34m Enem (Hydrocortisone) .... Insert 1 Enema Into Rectum Every Morning. (Use Rowasa Suppositories At Bedtime). Pharmacy-Please D/c Rx For Hydrocortisone At Bedtime! 23)  Cortisporin-Tc 3.06-30-08-0.5 Mg/ml Susp (Neomycin-Colist-Hc-Thonzonium) .... 2 Drops in Affected Ear Twice A Day As Needed 24)  Promethazine Hcl 25 Mg Tabs (Promethazine Hcl) ..Marland Kitchen. 1 By Mouth Every 4hrs As Needed For Nausea 25)  Vitamin D (Ergocalciferol) 50000 Unit Caps (Ergocalciferol) .... Take One Capsule By Mouth Once Every Week 26)  Augmentin 875-125 Mg Tabs (Amoxicillin-Pot Clavulanate) .... Take One Tablet By Mouth Two Times A Day Until Gone  Current Medications (verified): 1)  Flonase 50 Mcg/act  Susp (Fluticasone Propionate) .... 2 Sprays in Each Nostril Two Times A Day... 2)  Singulair 10 Mg  Tabs (Montelukast Sodium) .... Take 1 Tablet By Mouth Once A Day 3)  Mucinex 600 Mg Xr12h-Tab (Guaifenesin) .... Take 2 Tab By Mouth Two Times A Day W/ Plenty of Fluids..Marland KitchenMarland Kitchen4)  Albuterol Sulfate (2.5 Mg/340m 0.083% Nebu (Albuterol Sulfate) .... Use in Nebulizer Up To Three Times Per Day As Needed For Wheezing... 5)  Plavix 75 Mg  Tabs (Clopidogrel Bisulfate) .... Take 1 Tablet By Mouth Once A Day 6)  Nitrostat 0.4 Mg Subl  (Nitroglycerin) ...Marland Kitchen 1 Tablet Under Tongue At Onset of Chest Pain; You May Repeat Every 5 Minutes For Up To 3 Doses. 7)  Isosorbide Dinitrate 30 Mg  Tabs (Isosorbide Dinitrate) .... Take 1/2 Tablet Once Daily 8)  Atenolol 50 Mg  Tabs (Atenolol) .... Take 1 Tablet By Mouth Once A Day 9)  Norvasc 10 Mg  Tabs (Amlodipine Besylate) .... Take 1 Tablet By Mouth Once A Day 10)  Clonidine Hcl 0.1 Mg  Tabs (Clonidine Hcl) ...Marland Kitchen 1 Tab By Mouth Two Times A Day... 11)  Lasix 40 Mg  Tabs (Furosemide) .... Take 2 Tablets in The Am, and Take 1 Tablet By  Mouth in The Pm 12)  Potassium Chloride Crys Cr 20 Meq  Tbcr (Potassium Chloride Crys Cr) .... Take 1 Tab By Mouth Two Times A Day... 13)  Crestor 5 Mg Tabs (Rosuvastatin Calcium) .... Take 1 Tablet By Mouth Once A Day 14)  Metformin Hcl 500 Mg  Tabs (Metformin Hcl) .Marland Kitchen.. 1 Tab By Mouth Two Times A Day... 15)  Prilosec Otc 20 Mg Tbec (Omeprazole Magnesium) .... Take 1 Tab By Mouth Two Times A Day... 16)  Lialda 1.2 Gm Tbec (Mesalamine) .... Take 1 Tab By Mouth Two Times A Day... 17)  Rowasa 4 Gm Kit (Mesalamine-Cleanser) .... Insert 1 Enema Into Rectum At Bedtime 18)  Analpram-Hc 1-2.5 % Crea (Hydrocortisone Ace-Pramoxine) .... Apply A Small Amount To Affected Area  Twice A Day 19)  Nabumetone 750 Mg  Tabs (Nabumetone) .... Take 1 Tab By Mouth Two Times A Day W/ Food... 20)  Parafon Forte Dsc 500 Mg  Tabs (Chlorzoxazone) .Marland Kitchen.. 1 Tab By Mouth 4 Times Per Day As Needed For Muscle Spasm... 21)  Alprazolam 0.5 Mg  Tabs (Alprazolam) .... Take 1 Tablet By Mouth Three Times A Day Not To Exceed 3 Per Day 22)  Hydrocortisone 100 Mg/54m Enem (Hydrocortisone) .... Insert 1 Enema Into Rectum Every Morning. (Use Rowasa Suppositories At Bedtime). Pharmacy-Please D/c Rx For Hydrocortisone At Bedtime! 23)  Cortisporin-Tc 3.06-30-08-0.5 Mg/ml Susp (Neomycin-Colist-Hc-Thonzonium) .... 2 Drops in Affected Ear Twice A Day As Needed 24)  Promethazine Hcl 25 Mg Tabs (Promethazine Hcl) ..Marland Kitchen.  1 By Mouth Every 4hrs As Needed For Nausea 25)  Vitamin D (Ergocalciferol) 50000 Unit Caps (Ergocalciferol) .... Take One Capsule By Mouth Once Every Week 26)  Augmentin 875-125 Mg Tabs (Amoxicillin-Pot Clavulanate) .... Take One Tablet By Mouth Two Times A Day Until Gone 27)  Hydromet 5-1.5 Mg/58mSyrp (Hydrocodone-Homatropine) ...Marland Kitchen 1-2 Tsp Every 4-6 Hr As Needed Cough 28)  Prednisone 10 Mg Tabs (Prednisone) .... 4 Tabs For 2 Days, Then 3 Tabs For 2 Days, 2 Tabs For 2 Days, Then 1 Tab For 2 Days, Then Stop  Allergies (verified): 1)  ! Codeine 2)  ! Cipro 3)  ! Zithromax (Azithromycin) 4)  ! Tramadol Hcl (Tramadol Hcl) 5)  ! Levaquin (Levofloxacin) 6)  ! Asa 7)  ! Tylox  Past History:  Past Medical History: Last updated: 06/02/2009  ALLERGY (ICD-995.3) ASTHMA (ICD-493.90) BRONCHIECTASIS (ICD-494.0) PULMONARY NODULE (ICD-518.89) HYPERTENSION (ICD-401.9) CORONARY ARTERY DISEASE (ICD-414.00) CHEST PAIN, ATYPICAL (ICTFT-732.20DIASTOLIC HEART FAILURE, CHRONIC (ICD-428.32) HYPERCHOLESTEROLEMIA (ICD-272.0) DIABETES MELLITUS (ICD-250.00) OBESITY (ICD-278.00) GERD (ICD-530.81) DIVERTICULOSIS OF COLON (ICD-562.10) Hx of ULCERATIVE COLITIS (ICD-556.9) COLONIC POLYPS (ICD-211.3) MICROSCOPIC HEMATURIA (ICD-599.72) NEOPLASM, KIDNEY (ICD-239.5) STRESS INCONTINENCE (ICD-788.39) DEGENERATIVE JOINT DISEASE (ICD-715.90) FIBROMYALGIA (ICD-729.1) ANXIETY (ICD-300.00) Hx of ANEMIA (ICD-285.9)  Past Surgical History: Last updated: 06/02/2009 S/P hysterectomy S/P cholecystectomy S/P right hand surgery 10/07 by Dr. GrAmedeo PlentyFamily History: Last updated: 03Apr 09, 2010ather died age 68/ ASHD/ MI, pneumonia, arthritis Mother alive age 223/ severe parkinson's disease 2 Siblings- Bro died w/ sarcoid, DM Sis, GlTami Linage 1168as HBP, Chol, DM No FH of Colon Cancer, but father had colon polyps  Social History: Last updated: 03April 09, 2010ivorced ex-smoker: smoked for 10-1543yrp to  2ppd, quit 1975 social alcohol not exercising retired  Risk Factors: Exercise: yes (01/20/2008)  Risk Factors: Smoking Status: quit (03/16/2008)  Review of Systems      See HPI  Vital Signs:  Patient profile:   [age]ar old female Height:      62  inches Weight:      210 pounds BMI:     38.55 O2 Sat:      96 % on Room air Temp:     98.1 degrees F oral Pulse rate:   96 / minute BP sitting:   146 / 86  (right arm) Cuff size:   large  Vitals Entered By: Parke Poisson CNA (June 09, 2009 3:40 PM)  O2 Flow:  Room air CC: dyspnea, wheezing, dry cough onset yesterday - denies f/c/s, n/v.  was given augmentin via phone msg yesterday. Is Patient Diabetic? Yes Comments Medications reviewed with patient Daytime contact number verified with patient. Parke Poisson CNA  June 09, 2009 3:43 PM    Physical Exam  Additional Exam:  WD, Obese, 70 y/o BF in NAD... GENERAL:  Alert & oriented; pleasant & cooperative... HEENT:  [place]/AT, EOM-wnl, PERRLA, EACs-clear, TMs-wnl, NOSE-clear, THROAT-clear & wnl. NECK:  Supple w/ fairROM; no JVD; normal carotid impulses w/o bruits; no thyromegaly or nodules palpated; no lymphadenopathy. CHEST:  Coarse BS w/ faint exp wheezing ... +trigger points along trapezius area and chest wall. HEART:  Regular Rhythm; without murmurs/ rubs/ or gallops heard... ABDOMEN:  Obese, soft & nontender; normal bowel sounds; no organomegaly or masses detected. EXT: without deformities, mild arthritic changes; no varicose veins/ +venous insuffic/ tr edema. NEURO:  CN's intact;  no focal neuro deficits...     Impression & Recommendations:  Problem # 1:  BRONCHITIS, ACUTE (ICD-466.0) Finish Augmentin .  Prednisone taper over next week.  Mucinex DM two times a day as needed cough/congestion Increase fluids  Hydromet as needed cough, may make you sleepy.  Please contact office for sooner follow up if symptoms do not improve or worsen  follow up Dr. Lenna Gilford as  scheduled.  Her updated medication list for this problem includes:    Singulair 10 Mg Tabs (Montelukast sodium) .Marland Kitchen... Take 1 tablet by mouth once a day    Mucinex 600 Mg Xr12h-tab (Guaifenesin) .Marland Kitchen... Take 2 tab by mouth two times a day w/ plenty of fluids...    Albuterol Sulfate (2.5 Mg/103m) 0.083% Nebu (Albuterol sulfate) ..... Use in nebulizer up to three times per day as needed for wheezing...    Augmentin 875-125 Mg Tabs (Amoxicillin-pot clavulanate) ..Marland Kitchen.. Take one tablet by mouth two times a day until gone    Hydromet 5-1.5 Mg/549mSyrp (Hydrocodone-homatropine) ...Marland Kitchen. 1-2 tsp every 4-6 hr as needed cough  Orders: Admin of Therapeutic Inj  intramuscular or subcutaneous ([id number]- Medrol 4059mJ1030) Depo- Medrol 40m71m1040) Est. Patient Level IV ([phone] Added to Medication List This Visit: 1)  Albuterol Sulfate (2.5 Mg/3ml)65m083% Nebu (Albuterol sulfate) .... Use in nebulizer up to three times per day as needed for wheezing... 2)  Hydromet 5-1.5 Mg/5ml S96m (Hydrocodone-homatropine) .... 1-Marland Kitchen tsp every 4-6 hr as needed cough 3)  Prednisone 10 Mg Tabs (Prednisone) .... 4 tabs for 2 days, then 3 tabs for 2 days, 2 tabs for 2 days, then 1 tab for 2 days, then stop  Complete Medication List: 1)  Flonase 50 Mcg/act Susp (Fluticasone propionate) .... 2 sprays in each nostril two times a day... 2)  Singulair 10 Mg Tabs (Montelukast sodium) .... Take 1 tablet by mouth once a day 3)  Mucinex 600 Mg Xr12h-tab (Guaifenesin) .... Take 2 tab by mouth two times a day w/ plenty of fluids... 4) Marland KitchenMarland Kitchenlbuterol Sulfate (2.5 Mg/3ml) 028m3% Nebu (Albuterol sulfate) .... Use in nebulizer up to three times per day  as needed for wheezing... 5)  Plavix 75 Mg Tabs (Clopidogrel bisulfate) .... Take 1 tablet by mouth once a day 6)  Nitrostat 0.4 Mg Subl (Nitroglycerin) .Marland Kitchen.. 1 tablet under tongue at onset of chest pain; you may repeat every 5 minutes for up to 3 doses. 7)  Isosorbide Dinitrate 30 Mg  Tabs (Isosorbide dinitrate) .... Take 1/2 tablet once daily 8)  Atenolol 50 Mg Tabs (Atenolol) .... Take 1 tablet by mouth once a day 9)  Norvasc 10 Mg Tabs (Amlodipine besylate) .... Take 1 tablet by mouth once a day 10)  Clonidine Hcl 0.1 Mg Tabs (Clonidine hcl) .Marland Kitchen.. 1 tab by mouth two times a day... 11)  Lasix 40 Mg Tabs (Furosemide) .... Take 2 tablets in the am, and take 1 tablet by mouth in the pm 12)  Potassium Chloride Crys Cr 20 Meq Tbcr (Potassium chloride crys cr) .... Take 1 tab by mouth two times a day... 13)  Crestor 5 Mg Tabs (Rosuvastatin calcium) .... Take 1 tablet by mouth once a day 14)  Metformin Hcl 500 Mg Tabs (Metformin hcl) .Marland Kitchen.. 1 tab by mouth two times a day... 15)  Prilosec Otc 20 Mg Tbec (Omeprazole magnesium) .... Take 1 tab by mouth two times a day... 16)  Lialda 1.2 Gm Tbec (Mesalamine) .... Take 1 tab by mouth two times a day... 17)  Rowasa 4 Gm Kit (Mesalamine-cleanser) .... Insert 1 enema into rectum at bedtime 18)  Analpram-hc 1-2.5 % Crea (Hydrocortisone ace-pramoxine) .... Apply a small amount to affected area  twice a day 19)  Nabumetone 750 Mg Tabs (Nabumetone) .... Take 1 tab by mouth two times a day w/ food... 20)  Parafon Forte Dsc 500 Mg Tabs (Chlorzoxazone) .Marland Kitchen.. 1 tab by mouth 4 times per day as needed for muscle spasm... 21)  Alprazolam 0.5 Mg Tabs (Alprazolam) .... Take 1 tablet by mouth three times a day not to exceed 3 per day 22)  Hydrocortisone 100 Mg/54m Enem (Hydrocortisone) .... Insert 1 enema into rectum every morning. (use rowasa suppositories at bedtime). pharmacy-please d/c rx for hydrocortisone at bedtime! 23)  Cortisporin-tc 3.06-30-08-0.5 Mg/ml Susp (Neomycin-colist-hc-thonzonium) .... 2 drops in affected ear twice a day as needed 24)  Promethazine Hcl 25 Mg Tabs (Promethazine hcl) ..Marland Kitchen. 1 by mouth every 4hrs as needed for nausea 25)  Vitamin D (ergocalciferol) 50000 Unit Caps (Ergocalciferol) .... Take one capsule by mouth once every  week 26)  Augmentin 875-125 Mg Tabs (Amoxicillin-pot clavulanate) .... Take one tablet by mouth two times a day until gone 27)  Hydromet 5-1.5 Mg/527mSyrp (Hydrocodone-homatropine) ...Marland Kitchen 1-2 tsp every 4-6 hr as needed cough 28)  Prednisone 10 Mg Tabs (Prednisone) .... 4 tabs for 2 days, then 3 tabs for 2 days, 2 tabs for 2 days, then 1 tab for 2 days, then stop  Patient Instructions: 1)  Finish Augmentin .  2)  Prednisone taper over next week.  3)  Mucinex DM two times a day as needed cough/congestion 4)  Increase fluids  5)  Hydromet as needed cough, may make you sleepy.  6)  Please contact office for sooner follow up if symptoms do not improve or worsen  7)  follow up Dr. NaLenna Gilfords scheduled.  Prescriptions: ALBUTEROL SULFATE (2.5 MG/3ML) 0.083% NEBU (ALBUTEROL SULFATE) use in nebulizer up to three times per day as needed for wheezing...  #27088m 5   Entered by:   JesParke PoissonA   Authorized by:   TamRexene Edison  Signed by:   Parke Poisson CNA on 06/09/2009   Method used:   Electronically to        [facility]. [phone]* (retail)       [place], [place]  [address]       Ph: [phone] or [phone]       Fax: [phone]   RxID:   [id number] PREDNISONE 10 MG TABS (PREDNISONE) 4 tabs for 2 days, then 3 tabs for 2 days, 2 tabs for 2 days, then 1 tab for 2 days, then stop  #20 x 0   Entered and Authorized by:   Rexene Edison NP   Signed by:   Tammy Parrett NP on 06/09/2009   Method used:   Electronically to        [facility]. [phone]* (retail)       [place], [place]  [address]       Ph: [phone] or [phone]       Fax: [phone]   RxID:   [id number] HYDROMET 5-1.5 MG/5ML SYRP (HYDROCODONE-HOMATROPINE) 1-2 tsp every 4-6 hr as needed cough  #8 oz x 0   Entered and Authorized by:   Rexene Edison NP   Signed by:   Tammy Parrett NP on 06/09/2009   Method used:   Print then Give to Patient   RxID:    [id number]    Medication Administration  Injection # 1:    Medication: Depo- Medrol 24m    Diagnosis: BRONCHITIS, ACUTE (ICD-466.0)    Route: IM    Site: LUOQ gluteus    Exp Date: 01-2010    Lot #: [id number]    Mfr: [email or web address]    Patient tolerated injection without complications    Given by: JParke PoissonCNA (June 09, 2009 4:13 PM)  Injection # 2:    Medication: Depo- Medrol 472m   Diagnosis: BRONCHITIS, ACUTE (ICD-466.0)    Route: IM    Site: LUOQ gluteus    Exp Date: 01-2010    Lot #: [id number]    Mfr: [email or web address]    Patient tolerated injection without complications    Given by: JeParke PoissonNA (June 09, 2009 4:13 PM)  Orders Added: 1)  Admin of Therapeutic Inj  intramuscular or subcutaneous [96372] 2)  Depo- Medrol 4016mJ1030] 3)  Depo- Medrol 16m81m1040] 4)  Est. Patient Level IV [992[90379]

## 2010-06-01 NOTE — Miscellaneous (Signed)
Summary: Med change. Lialda not covered under plan.  Clinical Lists Changes  Medications: Changed medication from LIALDA 1.2 GM TBEC (MESALAMINE) Take 2 tablets by mouth EVERY morning! to ASACOL HD 800 MG TBEC (MESALAMINE) 2 tablets by mouth two times a day - Signed Rx of ASACOL HD 800 MG TBEC (MESALAMINE) 2 tablets by mouth two times a day;  #120 x 3;  Signed;  Entered by: Marlon Pel CMA (AAMA);  Authorized by: Ladene Artist MD [facility];  Method used: Electronically to [facility] [address] [place], [place]  [address], Ph: [phone] or [phone], Fax: [phone]    Prescriptions: ASACOL HD 800 MG TBEC (MESALAMINE) 2 tablets by mouth two times a day  #120 x 3   Entered by:   Marlon Pel CMA ([place])   Authorized by:   Ladene Artist MD [facility]   Signed by:   Marlon Pel CMA ([place]) on 05/25/2009   Method used:   Electronically to        [facility]. [phone]* (retail)       [address]       [place], [place]  [address]       Ph: [phone] or [phone]       Fax: [phone]   RxID:   [id number]

## 2010-06-01 NOTE — Letter (Signed)
Summary: Results Letter  [facility]  [place], [place] [address]   Phone: [phone]  Fax: [phone]        February 10, 2010 MRN: [id number]    [facility] [address] [place], [place]  [address]    Dear Ms. Ostenson,    We have tried to reach you multiple times with no answer or voicemial. The lab work you had recently in our office is normal. You have any questions or concerns please contact our office at [phone].      Sincerely,   Marlon Pel CMA [facility])  This letter has been electronically signed by your physician.

## 2010-06-01 NOTE — Assessment & Plan Note (Signed)
Summary: cough/rib pain/ok per SN for 2 pm appt//lmr   Primary Care Provider:  Teressa Lower, MD  CC:  5-6 month ROV & add-on for recent symptoms....  History of Present Illness: 70 y/o BF here for a follow up visit... she has mult med problems as noted below...    ~  GUY40:  she continues f/u her Ulc Colitis w/ DrStark- on Lialda, Rowasa, & weaning off the Hydrocort enemas... she requests phenergan for nauea... continues f/u w/ DrKatz for Cards- stable on meds, some atyp CP, no angina... BP controlled on meds and Chol is great on the Crestor... needs better diet + exercise to aid weight reduction...  she has mult somatic complaints- hx DJD, FM, Anxiety...  ~  Aug11:  she had an AECOPD 2/11- improved w/ meds... saw Benay Spice 3/11- stable, no changes made... saw [facility] 4/11- thor & lumbar spondylosis- Rx Voltaren gel... she has a complex medical regimen and didn't bring list or her bottles to office today> encouraged to bring to each visit for review... her CC is sore all over from the FM, worry about daugh w/ cancer, daugh says "momma drinks too much"...   ~  May 18, 2010:  66moROV & add-on after URI 12/11 characterized by cough, congestion, sinus drainage- tried OTC meds w/o relief, called office 7 given Augmentin, MMW, Mucinex & improved... then developed left sided rib pain- called Cards, went to ER (CXR neg, EKG neg, labs neg), given MS w/ relief & Tramadol Prn but this didn't help... still [facility], told  "ribs out of whack due to cough" & given Arthritis Tylenol also w/o benefit...no rash presnr on left chest wall & no hyperesthesia of skin> we discussed CWP- told to rest, apply heat, use Mucinex, Delsym, Tussionex, Vicodin, & f/u here 133mo   Current Problem List:          ALLERGY (ICD-995.3) - on Saline, FLONASE 2spBid, SINGULAIR 1051m... she does not wish to consider decreasing these meds...  ASTHMA (ICD-493.90) - prev on Advair but she  stopped on her own... [facility], & NEBULIZER w/ Albut as needed (ave 3x per week) & PROAIR Prn when out & about... she is an ex-smoker having quit in 1975, no recent exacerbations.  BRONCHIECTASIS (ICD-494.0) - CT Chest 3/10 showed bilat LL bronchiectasis L>R...  PULMONARY NODULE (ICD-518.89) - 1.4cm nodule in LLL showed up on Urology CTAbd 07/12/08...  CT Chest 07/22/08 confirmed 1.4cm peripheral LLL nodule that wasn't present on CT in 2005 & no other lesions, +bronchiectasis in both LLs L>R, mod cardiomegaly...  PET Scan 4/10 showed no abn hypermetabolic activity- therefore likely scar or granuloma...  ~  CXR 2/11 showed cardiomeg, some COPD, NAD- LLL nodule seen on CT 3/10 is not vis on CXR.  ~  CXR 1/12 in ER showed cardiomeg, vasc congestion, no overt edema, no nodule seen.  HYPERTENSION (ICD-401.9) - controlled on ATENOLOL 79m86m NORVASC 10mg19mCLONIDINE 0.1mgBi90mLASIX 40mg- 97mbs/d (2AM, 1PM), KCL 20Bid... BP=126/84 today, not checking BP at home... denies HAs, visual changes, palipit, dizziness, syncope, dyspnea, edema, etc...  ~  labs 4/10 showed K= 3.3 but she never increased KCl to Bid.  ~  labs 2/11 showed K= 3.5, rec> incr KCl to Bid...  CORONARY ARTERY DISEASE (ICD-414.00) - on PLAVIX 75mg/d 38mDUR 30mg- 1/69mb daily...  ~  she is s/p PTCA & stent to the LAD on 10/02...  ~  last cath 2/07 w/ patent LAD stent, non-obstructive dis otherwise, EF=65%...  ~  last NuclearStressTest 5/08 showed no ischemia, EF=64% (similiar to 2005)...  ~  hosp in Jun09 w/ CP- cath showing non-obstructive CAD w/ 25-40% lesions in all 3 vessels, prev stent in LAD patent, norm LVF..  ~  3/11:  f/u DrKatz & stable- no changes made, rec f/u 79yr  CHEST PAIN, ATYPICAL (ICD-786.59) - she has chronic CWP, prob related to her fibromyalgia...  ~  1/12:  she went to ER, then [facility] w/ left CWP- cardiac evals neg, treated w/ rest, heat, Vicodin, Tussionex, etc...  HYPERCHOLESTEROLEMIA  (ICD-272.0) - on LIPITOR 157md, + diet Rx..  ~  [place] showed TChol 132, TG 58, HDL 60, LDL 60  ~  FLP 7/09 showed TChol 116, TG 49, HDL 49, LDL 57  ~  FLP in hosp 3/10 showed TChol 130, TG 55, HDL 53, LDL 66  ~  FLP 2/11 on Cres5 showed TChol 104, TG 67, HDL 50, LDL 40  ~  Crestor 33m69mhanged to Lipitor 61m39mr insurance co.  DIABETES MELLITUS (ICD-250.00) - on METFORMIN 500mg31m + low carb diet...  ~  last labs 11/08 showed BS=120, HgA1c=6.2  ~  labs 6/09 in hosp showed BS=110-120, HgA1c= 6.6... saMarland KitchenMarland Kitchen meds, needs better diet!  ~  labs 3/10 in hosp showed BS= 113-155, HgA1c= 6.7  ~  labs 2/11 showed BS= 108, A1c= 7.1  ~  labs 1/12 in ER showed BS= 167...  OBESITY (ICD-278.00) -   ~  weight 11/09 = 204#  ~  weight 3/10 = 208#  ~  weight 2/11 = 205#... we reviewed diet + exercise program.  ~  weight 1/12 = 208#  GERD (ICD-530.81) - on PRILOSEC 20mg-22ming 1-2 daily but insurance won't pay- wants OMEPRAZOLE 40mg/d79mIVERTICULOSIS OF COLON (ICD-562.10) & COLONIC POLYPS (ICD-211.3) - last colonoscopy was 7/08 w/ divertics & colitis seen, + hyperplastic polyp removed...  Hx of ULCERATIVE COLITIS (ICD-556.9) - followed by DrStark for GI- on LIALDA 2.4gm/d, ROWASA Qhs, & off Hydrocort enemas...  ~  seen 10/11 by DrStark w/ freq loose stools- Asacol incr to 4tabs Tid & CANASA suppos for 71mo.  M733moSCOPIC HEMATURIA (ICD-599.72),  NEOPLASM, KIDNEY (ICD-239.5),  STRESS INCONTINENCE (ICD-788.39) - eval and Rx by DrWrenn 9/09 w/ stress incontinence, dysuria w/ microscopic hematuria-  Cysto w/ bladder bx was neg; and CT + Sonar w/ 1.8cm left upper pole renal lesion of ?etiology- poss tumor vs cyst (min enhancement, solid on sonar w/ min blood flow + 2 sm cysts also seen)...  they are following a watchful waiting protocol & repeat CT 3/10 showed stable 1.7cm category II cyst upper pole of left kidney without change... f/u planned 19yr.  DE42yrRATIVE JOINT DISEASE (ICD-715.90) - she is followed by  DrVoytek... RELAFEN 750mg Bid 71m& PARAFON FORTE Tid Prn...  ~  she was seen 12/11 w/ bilat foot pain & XRay showed arthritic changes- given DepoMedrol+Toradol shot IM.  FIBROMYALGIA (ICD-729.1)  VITAMIN D DEFICIENCY (ICD-268.9) - Vit D level 2/11= 11 and pt rec to take Vit D 50000 u weekly but ?never filled this Rx... therefore rec 8/11 to switch to OTC Vit D 5000 u daily from the health food store...   ANXIETY (ICD-300.00) - she has lots of stress caring for Momma... on ALPRAZOLAM 0.33mg Tid Pr26m.  Hx of ANEMIA (ICD-285.9) - on NUIRON 150mg/d...  66mlabs 11/08 showed Hg=12.2, Fe=38/ TIBC=287/ sat=9%...  ~  labs 3/10  in hosp showed Hg= 13.0, MCV= 92...  ~  labs 2/11 showed Hg= 12.7, Fe= 67...  ~  labs 1/12 in ER showed Hg= 11.7   Preventive Screening-Counseling & Management  Alcohol-Tobacco     Smoking Status: quit     Packs/Day: 2ppd     Year Started: 1960     Year Quit: 56  Caffeine-Diet-Exercise     Does Patient Exercise: yes  Allergies: 1)  ! Codeine 2)  ! Cipro 3)  ! Zithromax (Azithromycin) 4)  ! Tramadol Hcl (Tramadol Hcl) 5)  ! Levaquin (Levofloxacin) 6)  ! Asa 7)  ! Tylox  Comments:  Nurse/Medical Assistant: [name] medications and allergies were reviewed with the patient and were updated in the Medication and Allergy Lists.  Past History:  Past Medical History: ALLERGY (ICD-995.3) ASTHMA (ICD-493.90) BRONCHIECTASIS (ICD-494.0) PULMONARY NODULE (ICD-518.89) HYPERTENSION (ICD-401.9) CORONARY ARTERY DISEASE (ICD-414.00) CHEST PAIN, ATYPICAL (CHE-527.78) DIASTOLIC HEART FAILURE, CHRONIC (ICD-428.32) HYPERCHOLESTEROLEMIA (ICD-272.0) DIABETES MELLITUS (ICD-250.00) OBESITY (ICD-278.00) GERD (ICD-530.81) DIVERTICULOSIS OF COLON (ICD-562.10) Hx of LEFT SIDED ULCERATIVE COLITIS (ICD-556.9), 2008 ADENOMATOUS COLONIC POLYPS (ICD-211.3) 02/2006 MICROSCOPIC HEMATURIA (ICD-599.72) NEOPLASM, KIDNEY (ICD-239.5) STRESS INCONTINENCE  (ICD-788.39) DEGENERATIVE JOINT DISEASE (ICD-715.90) FIBROMYALGIA (ICD-729.1) VITAMIN D DEFICIENCY (ICD-268.9) ANXIETY (ICD-300.00) Hx of ANEMIA (ICD-285.9)  Past Surgical History: S/P hysterectomy S/P cholecystectomy S/P right hand surgery 10/07 by Dr. Amedeo Plenty PTCA-Stent  Family History: Reviewed history from 07/26/2008 and no changes required. Father died age 71 w/ ASHD/ MI, pneumonia, arthritis Mother alive age 62 w/ severe parkinson's disease 2 Siblings- Bro died w/ sarcoid, DM Sis, Tami Lin, age 8 has HBP, Chol, DM No FH of Colon Cancer, but father had colon polyps  Social History: Reviewed history from 11/29/2009 and no changes required. Divorced ex-smoker: smoked for 10-57yr up to 2ppd, quit 1975 social alcohol not exercising retired  Review of Systems      See HPI       The patient complains of decreased hearing, chest pain, dyspnea on exertion, peripheral edema, prolonged cough, headaches, muscle weakness, difficulty walking, and depression.  The patient denies anorexia, fever, weight loss, weight gain, vision loss, hoarseness, syncope, hemoptysis, abdominal pain, melena, hematochezia, severe indigestion/heartburn, hematuria, incontinence, suspicious skin lesions, transient blindness, unusual weight change, abnormal bleeding, enlarged lymph nodes, and angioedema.    Vital Signs:  Patient profile:   70year old female Height:      62 inches Weight:      208.13 pounds BMI:     38.21 O2 Sat:      97 % on Room air Temp:     98.4 degrees F oral Pulse rate:   66 / minute BP sitting:   126 / 84  (left arm) Cuff size:   large  Vitals Entered By: LElita BooneCMA (May 18, 2010 1:48 PM)  O2 Sat at Rest %:  97 O2 Flow:  Room air CC: 5-6 month ROV & add-on for recent symptoms... Is Patient Diabetic? Yes Pain Assessment Patient in pain? yes      Onset of pain  LEFT RIB PAIN Comments MEDS UPDATED TODAY WITH PT   Physical Exam  Additional Exam:   WD, Obese, 70y/o BF in NAD... c/o pain in her left lat chest wall> no rash present. GENERAL:  Alert & oriented; pleasant & cooperative... HEENT:  [place]/AT, EOM-wnl, PERRLA, EACs-clear, TMs-wnl, NOSE-clear, THROAT-clear & wnl. NECK:  Supple w/ fairROM; no JVD; normal carotid impulses w/o bruits; no thyromegaly or nodules palpated; no lymphadenopathy. CHEST:  Clear to P &  A; without wheezes/ rales/ or rhonchi... +trigger points along trapezius area and chest wall. HEART:  Regular Rhythm; without murmurs/ rubs/ or gallops heard... +tender to palp. ABDOMEN:  Obese, soft & nontender; normal bowel sounds; no organomegaly or masses detected. EXT: without deformities, mild arthritic changes; no varicose veins/ +venous insuffic/ tr edema. NEURO:  CN's intact;  no focal neuro deficits...    MISC. Report  Procedure date:  05/18/2010  Findings:      DATA REVIEWED:  ~  ER visit 05/14/10 w/ CXR, EKG, Labs...  ~  OV DrVoytek 03/30/10...  ~  OV DrStark 02/08/10...   Impression & Recommendations:  Problem # 1:  CHEST PAIN, ATYPICAL (ICD-786.59) She has CWP> likely related to her FM & precip by her bronchitic infection 12/11 (resolved w/ augmentin etc)... we discussed Rx for this CWP w/ rest, heat, Vicodin, Tussioonex, Mucinex, etc... watch for rash (none present in office today).  Problem # 2:  BRONCHIECTASIS (ICD-494.0) Her bronchitic exac is better after the augmentin Rx last month... continue other therapies w/ Mucinex, Fluids, Nebs, etc...  Problem # 3:  PULMONARY NODULE (ICD-518.89) Recent CXR will suffice for f/u film and no nodule seen...  Problem # 4:  HYPERTENSION (ICD-401.9) BP controlled>  same meds. Her updated medication list for this problem includes:    Atenolol 50 Mg Tabs (Atenolol) .Marland Kitchen... Take 1 tablet by mouth once a day    Norvasc 10 Mg Tabs (Amlodipine besylate) .Marland Kitchen... Take 1 tablet by mouth once a day    Clonidine Hcl 0.1 Mg Tabs (Clonidine hcl) .Marland Kitchen... 1 tab by mouth two times a  day...    Lasix 40 Mg Tabs (Furosemide) .Marland Kitchen... Take 2 tablets in the am, and take 1 tablet by mouth in the pm  Problem # 5:  CORONARY ARTERY DISEASE (ICD-414.00) Hx CAD>  stable w/o anginal CP, palpit, etc... continue same meds. Her updated medication list for this problem includes:    Plavix 75 Mg Tabs (Clopidogrel bisulfate) .Marland Kitchen... Take 1 tablet by mouth once a day    Nitrostat 0.4 Mg Subl (Nitroglycerin) .Marland Kitchen... 1 tablet under tongue at onset of chest pain; you may repeat every 5 minutes for up to 3 doses.    Isosorbide Mononitrate Cr 60 Mg Xr24h-tab (Isosorbide mononitrate) .Marland Kitchen... Take 1/2 tablet by mouth daily    Atenolol 50 Mg Tabs (Atenolol) .Marland Kitchen... Take 1 tablet by mouth once a day    Norvasc 10 Mg Tabs (Amlodipine besylate) .Marland Kitchen... Take 1 tablet by mouth once a day    Clonidine Hcl 0.1 Mg Tabs (Clonidine hcl) .Marland Kitchen... 1 tab by mouth two times a day...    Lasix 40 Mg Tabs (Furosemide) .Marland Kitchen... Take 2 tablets in the am, and take 1 tablet by mouth in the pm  Problem # 6:  HYPERCHOLESTEROLEMIA (ICD-272.0) She will f/u w/ [place] in one month w/ FASTING blood work... Her updated medication list for this problem includes:    Lipitor 10 Mg Tabs (Atorvastatin calcium) .Marland Kitchen... Take one tablet by mouth daily.  Problem # 7:  DIABETES MELLITUS (ICD-250.00) As above>  get on diet, exercise program & f/u next month w/ labs... Her updated medication list for this problem includes:    Metformin Hcl 500 Mg Tabs (Metformin hcl) .Marland Kitchen... 1 tab by mouth two times a day...  Problem # 8:  OTHER MEDICAL PROBLEMS AS NOTED>>>  Complete Medication List: 1)  Flonase 50 Mcg/act Susp (Fluticasone propionate) .... 2 sprays in each nostril two times a day.Marland KitchenMarland Kitchen 2)  Singulair 10 Mg Tabs (Montelukast sodium) .... Take 1 tablet by mouth once a day 3)  Albuterol Sulfate (2.5 Mg/17m) 0.083% Nebu (Albuterol sulfate) .... Use in nebulizer up to three times per day as needed for wheezing... 4)  Proair Hfa 108 (90 Base) Mcg/act Aers (Albuterol  sulfate) ..Marland Kitchen. 1-2 sprays every 6h as needed for wheezing... 5)  Mucinex 600 Mg Xr12h-tab (Guaifenesin) .... Take 2 tab by mouth two times a day w/ plenty of fluids..Marland KitchenMarland Kitchen6)  Plavix 75 Mg Tabs (Clopidogrel bisulfate) .... Take 1 tablet by mouth once a day 7)  Nitrostat 0.4 Mg Subl (Nitroglycerin) ..Marland Kitchen. 1 tablet under tongue at onset of chest pain; you may repeat every 5 minutes for up to 3 doses. 8)  Isosorbide Mononitrate Cr 60 Mg Xr24h-tab (Isosorbide mononitrate) .... Take 1/2 tablet by mouth daily 9)  Atenolol 50 Mg Tabs (Atenolol) .... Take 1 tablet by mouth once a day 10)  Norvasc 10 Mg Tabs (Amlodipine besylate) .... Take 1 tablet by mouth once a day 11)  Clonidine Hcl 0.1 Mg Tabs (Clonidine hcl) ..Marland Kitchen. 1 tab by mouth two times a day... 12)  Lasix 40 Mg Tabs (Furosemide) .... Take 2 tablets in the am, and take 1 tablet by mouth in the pm 13)  Potassium Chloride Crys Cr 20 Meq Tbcr (Potassium chloride crys cr) .... Take 1 tab by mouth two times a day... 14)  Lipitor 10 Mg Tabs (Atorvastatin calcium) .... Take one tablet by mouth daily. 15)  Metformin Hcl 500 Mg Tabs (Metformin hcl) ..Marland Kitchen. 1 tab by mouth two times a day... 16)  Omeprazole 40 Mg Cpdr (Omeprazole) .... Take 1 tab by mouth once daily- 30 min before the 1st meal of the day... 17)  Asacol 400 Mg Tbec (Mesalamine) .... Take 4 tabs by mouth three times a day 18)  Canasa 1000 Mg Supp (Mesalamine) .... One suppository into rectum at bedtime 19)  Analpram-hc 1-2.5 % Crea (Hydrocortisone ace-pramoxine) .... Apply a small amount to affected area  twice a day 20)  Promethazine Hcl 25 Mg Tabs (Promethazine hcl) ..Marland Kitchen. 1 by mouth every 4hrs as needed for nausea 21)  Nabumetone 750 Mg Tabs (Nabumetone) .... Take 1 tab by mouth two times a day w/ food... 22)  Parafon Forte Dsc 500 Mg Tabs (Chlorzoxazone) ..Marland Kitchen. 1 tab by mouth 4 times per day as needed for muscle spasm... 23)  Alprazolam 0.5 Mg Tabs (Alprazolam) .... Take 1 tablet by mouth three times a  day not to exceed 3 per day 24)  Vitamin D3 5000 Unit Caps (Cholecalciferol) .... Take one capsule by mouth once daily... 25)  Hydrocodone-acetaminophen 5-500 Mg Tabs (Hydrocodone-acetaminophen) .... Take 1 tab by mouth every 6h as needed for pain... 26)  Tussionex Pennkinetic Er 10-8 Mg/532mLqcr (Hydrocod polst-chlorphen polst) .... Take 1 tsp by mouth every 12h as needed for cough...  Patient Instructions: 1)  Today we updated your med list- see below.... 2)  For the Chest Wall Pain:  rest the chest (no lifting etc);  apply heat (use moist heat & heating pad to the area);  use the VICODIN every 6H for the pain;  watch for any rash & call me if you see one... 3)  For the Cough:  use the MUCINEX 1-2 tabs twice daily w/ lots of fluids;  you may also use DELSYM 2 tsp twice daily as needed;  and we wrote a perscription for the PROAIR inhaler , and the TUSSIONEX cough syrup...Marland KitchenMarland Kitchen)  Call for  any questions.Marland KitchenMarland Kitchen 5)  Let's plan a follow up appt in one month for recheck & we will do your FASTING labs at that time... Prescriptions: TUSSIONEX PENNKINETIC ER 10-8 MG/5ML LQCR (HYDROCOD POLST-CHLORPHEN POLST) take 1 tsp by mouth every 12H as needed for cough...  #40z x 6   Entered and Authorized by:   Noralee Space MD   Signed by:   Noralee Space MD on 05/18/2010   Method used:   Print then Give to Patient   RxID:   [id number] HYDROCODONE-ACETAMINOPHEN 5-500 MG TABS (HYDROCODONE-ACETAMINOPHEN) take 1 tab by mouth every 6H as needed for pain...  #50 x 6   Entered and Authorized by:   Noralee Space MD   Signed by:   Noralee Space MD on 05/18/2010   Method used:   Print then Give to Patient   RxID:   [id number] OMEPRAZOLE 40 MG CPDR (OMEPRAZOLE) take 1 tab by mouth once daily- 30 min before the 1st meal of the day...  #30 x 12   Entered and Authorized by:   Noralee Space MD   Signed by:   Noralee Space MD on 05/18/2010   Method used:   Print then Give to Patient   RxID:   [id number] PROAIR  HFA 108 (90 BASE) MCG/ACT AERS (ALBUTEROL SULFATE) 1-2 sprays every 6H as needed for wheezing...  #1 x 12   Entered and Authorized by:   Noralee Space MD   Signed by:   Noralee Space MD on 05/18/2010   Method used:   Print then Give to Patient   RxID:   [id number]    Immunization History:  Influenza Immunization History:    Influenza:  declined (05/18/2010)  Pneumovax Immunization History:    Pneumovax:  historical (05/21/2006)

## 2010-06-01 NOTE — Progress Notes (Signed)
Summary: Medication refill  Phone Note Call from Patient Call back at [facility] Phone [phone]   Caller: Patient Call For: Dr. Fuller Plan Reason for Call: Refill Medication Summary of Call: Needs a refill on her ASACOL....sch'd appt on 02-08-10 Initial call taken by: Webb Laws,  January 04, 2010 8:09 AM  Follow-up for Phone Call        Pt states she will keep her appt on 02/08/10 if she can have a refill on her Asacol. Rx was sent to pts pharmacy and told pt to keep her appt for any further refills.  Follow-up by: Marlon Pel CMA Deborra Medina),  January 04, 2010 9:33 AM    New/Updated Medications: ASACOL 400 MG TBEC (MESALAMINE) take 4 tabs by mouth two times a day as directed by DrStark... Must keep appt for any further refills! Prescriptions: ASACOL 400 MG TBEC (MESALAMINE) take 4 tabs by mouth two times a day as directed by DrStark... Must keep appt for any further refills!  #240 x 1   Entered by:   Marlon Pel CMA ([place])   Authorized by:   Ladene Artist MD [facility]   Signed by:   Marlon Pel CMA ([place]) on 01/04/2010   Method used:   Electronically to        [facility]. [phone]* (retail)       [address]       [place], [place]  [address]       Ph: [phone] or [phone]       Fax: [phone]   RxID:   [id number]

## 2010-06-01 NOTE — Letter (Signed)
Summary: [facility]  [facility]   Imported By: Rise Patience 04/14/2010 14:30:38  _____________________________________________________________________  External Attachment:    Type:   Image     Comment:   External Document

## 2010-06-01 NOTE — Letter (Signed)
Summary: [facility]  [facility]   Imported By: Phillis Knack 08/16/2009 12:22:44  _____________________________________________________________________  External Attachment:    Type:   Image     Comment:   External Document

## 2010-06-01 NOTE — Progress Notes (Signed)
Summary: rx  Phone Note Call from Patient Call back at Home Phone [phone]   Caller: Patient Call For: [name] Reason for Call: Talk to Nurse Summary of Call: sore throat, using Magic mouthwash and tylenol.  Not helping.  Can she get an antibiotic? - Augmentin is something her insurance will pay for. [facility] [place]  Initial call taken by: Zigmund Gottron,  August 10, 2009 8:05 AM  Follow-up for Phone Call        Pt c/o fever, soe throat and head congestion x 3 dyas. She has been using MMW and tylenol wiht no relief. Pt requests abx and she states augmentin is covered by her insurance. Please advise.  allergies:  ! Codeine 2)  ! Cipro 3)  ! Zithromax (Azithromycin) 4)  ! Tramadol Hcl (Tramadol Hcl) 5)  ! Levaquin (Levofloxacin) 6)  ! Asa 7)  ! Tylox  Additional Follow-up for Phone Call Additional follow up Details #1::        per SN----ok for pt to have augmentin 875  #14  1 by mouth two times a day until gone.  called and spoke with pt and she is aware of meds sent to her [place]  August 10, 2009 11:12 AM     New/Updated Medications: AUGMENTIN 875-125 MG TABS (AMOXICILLIN-POT CLAVULANATE) take one tablet by mouth two times a day until gone Prescriptions: AUGMENTIN 875-125 MG TABS (AMOXICILLIN-POT CLAVULANATE) take one tablet by mouth two times a day until gone  #14 x 0   Entered by:   Elita Boone CMA   Authorized by:   Noralee Space MD   Signed by:   Elita Boone CMA on 08/10/2009   Method used:   Electronically to        [facility] [phone]* (retail)       [address]       [place], [place]  [address]       Ph: [phone] or [phone]       Fax: [phone]   RxID:   [id number]

## 2010-06-01 NOTE — Letter (Signed)
Summary: Appointment - Reminder [place] [place]  [address]   [place], [name] [address]   Phone: [phone]  Fax: [phone]     May 27, 2009 MRN: [id number]   [facility] [address] [place], [place]  [address]   Dear Ms. Hermans,  Our records indicate that it is time to schedule a follow-up appointment with Dr. Ron Parker. It is very important that we reach you to schedule this appointment. We look forward to participating in your health care needs. Please contact us at the number listed above at your earliest convenience to schedule your appointment.  If you are unable to make an appointment at this time, give [place] a call so we can update our records.  Sincerely,   Darnell Level [facility] Scheduling Team

## 2010-06-01 NOTE — Assessment & Plan Note (Signed)
Summary: rash on side with pain//jwr   Primary Care Provider:  Teressa Lower, MD  CC:  4 day ROV for "rash on side"> r/o shingles....  History of Present Illness: 70 y/o BF who was seen 05/18/10 for a 29moROV & complained of left CWP that was evaluated in the ER & [facility] "my ribs are out of whack due to the coughing" >> see prev EMR note for details... she was asked to watch for rash on her side as poss evolution of shingles diagnosis & she called today w/ c/o rash on her side> asked to come for recheck so we could document the distrib of the rash & start therapy...  However on onspection there is no rash present & when asked she said "Oh, isn't that a rash" pointing to her normal skin in her left side... no redness, no hyperesthesia, etc... she did note some itching & as time went on the itching seemed to be more or a problem...   ** We discussed Shingles & what it looks like> offered Atarax to take for her itching... continue other Rx.  SEE EMR NOTE FROM 05/18/10 FOR PROBLEM LIST & DETAILS>>>    Preventive Screening-Counseling & Management  Alcohol-Tobacco     Smoking Status: quit     Packs/Day: 2ppd     Year Started: 1960     Year Quit: 1975  Caffeine-Diet-Exercise     Does Patient Exercise: yes  Allergies: 1)  ! Codeine 2)  ! Cipro 3)  ! Zithromax (Azithromycin) 4)  ! Tramadol Hcl (Tramadol Hcl) 5)  ! Levaquin (Levofloxacin) 6)  ! Asa 7)  ! Tylox  Comments:  Nurse/Medical Assistant: [name] medications and allergies were reviewed with the patient and were updated in the Medication and Allergy Lists.  Past History:  Past Medical History: ALLERGY (ICD-995.3) ASTHMA (ICD-493.90) BRONCHIECTASIS (ICD-494.0) PULMONARY NODULE (ICD-518.89) HYPERTENSION (ICD-401.9) CORONARY ARTERY DISEASE (ICD-414.00) CHEST PAIN, ATYPICAL (IQMV-784.69 DIASTOLIC HEART FAILURE, CHRONIC (ICD-428.32) HYPERCHOLESTEROLEMIA (ICD-272.0) DIABETES MELLITUS  (ICD-250.00) OBESITY (ICD-278.00) GERD (ICD-530.81) DIVERTICULOSIS OF COLON (ICD-562.10) Hx of LEFT SIDED ULCERATIVE COLITIS (ICD-556.9), 2008 ADENOMATOUS COLONIC POLYPS (ICD-211.3) 02/2006 MICROSCOPIC HEMATURIA (ICD-599.72) NEOPLASM, KIDNEY (ICD-239.5) STRESS INCONTINENCE (ICD-788.39) DEGENERATIVE JOINT DISEASE (ICD-715.90) FIBROMYALGIA (ICD-729.1) VITAMIN D DEFICIENCY (ICD-268.9) ANXIETY (ICD-300.00) Hx of ANEMIA (ICD-285.9)  Past Surgical History: S/P hysterectomy S/P cholecystectomy S/P right hand surgery 10/07 by Dr. GAmedeo PlentyPTCA-Stent  Family History: Reviewed history from 07/26/2008 and no changes required. Father died age 1735w/ ASHD/ MI, pneumonia, arthritis Mother alive age 5431w/ severe parkinson's disease 2 Siblings- Bro died w/ sarcoid, DM Sis, GTami Lin age 1436has HBP, Chol, DM No FH of Colon Cancer, but father had colon polyps  Social History: Reviewed history from 11/29/2009 and no changes required. Divorced ex-smoker: smoked for 10-129yrup to 2ppd, quit 1975 social alcohol not exercising retired  Review of Systems       Mult somatic complaints...   Vital Signs:  Patient profile:   [age]ear old female Height:      62 inches Weight:      208 pounds BMI:     38.18 O2 Sat:      98 % on Room air Temp:     97.3 degrees F oral Pulse rate:   65 / minute BP sitting:   128 / 80  (right arm) Cuff size:   large  Vitals Entered By: LeElita BooneMA (May 22, 2010 1:51 PM)  O2 Sat at Rest %:  98 O2 Flow:  Room air CC: 4 day ROV for "rash on side"> r/o shingles... Is Patient Diabetic? Yes Pain Assessment Patient in pain? yes      Onset of pain  rash on right side since thursday evening Comments meds updated today with pt   Physical Exam  Additional Exam:  WD, Obese, 70 y/o BF in NAD... c/o pain in her left lat chest wall> no rash present. GENERAL:  Alert & oriented; pleasant & cooperative... HEENT:  [place]/AT, EOM-wnl, PERRLA, EACs-clear,  TMs-wnl, NOSE-clear, THROAT-clear & wnl. NECK:  Supple w/ fairROM; no JVD; normal carotid impulses w/o bruits; no thyromegaly or nodules palpated; no lymphadenopathy. CHEST:  Clear to P & A; without wheezes/ rales/ or rhonchi... +trigger points along trapezius area and chest wall. HEART:  Regular Rhythm; without murmurs/ rubs/ or gallops heard... +tender to palp. ABDOMEN:  Obese, soft & nontender; normal bowel sounds; no organomegaly or masses detected. EXT: without deformities, mild arthritic changes; no varicose veins/ +venous insuffic/ tr edema. NEURO:  CN's intact;  no focal neuro deficits... DERM:  no shingles rash found...    Impression & Recommendations:  Problem # 1:  CHEST PAIN, ATYPICAL (ICD-786.59) She's had 2-3 evaluations for this atyp left CWP.Marland Kitchen. see prev note & Rx... no shingles rash apparent.  Problem # 2:  PRURITUS (ICD-698.9) We discussed Rx w/ Hydroxyzine Prn...  Problem # 3:  BRONCHIECTASIS (ICD-494.0) Aware>  she has AB, bronchiectasis, etc... Stable from the Pulm standpoint...  Problem # 4:  HYPERTENSION (ICD-401.9) BP controlled>  continue same meds. Her updated medication list for this problem includes:    Atenolol 50 Mg Tabs (Atenolol) .Marland Kitchen... Take 1 tablet by mouth once a day    Norvasc 10 Mg Tabs (Amlodipine besylate) .Marland Kitchen... Take 1 tablet by mouth once a day    Clonidine Hcl 0.1 Mg Tabs (Clonidine hcl) .Marland Kitchen... 1 tab by mouth two times a day...    Lasix 40 Mg Tabs (Furosemide) .Marland Kitchen... Take 2 tablets in the am, and take 1 tablet by mouth in the pm  Problem # 5:  OTHER MEDICAL ISSUES AS NOTED>>>  Complete Medication List: 1)  Flonase 50 Mcg/act Susp (Fluticasone propionate) .... 2 sprays in each nostril two times a day... 2)  Singulair 10 Mg Tabs (Montelukast sodium) .... Take 1 tablet by mouth once a day 3)  Albuterol Sulfate (2.5 Mg/69m) 0.083% Nebu (Albuterol sulfate) .... Use in nebulizer up to three times per day as needed for wheezing... 4)  Ventolin Hfa 108  (90 Base) Mcg/act Aers (Albuterol sulfate) ..Marland Kitchen. 1-2 sprays every 6 hours as needed for wheezing 5)  Mucinex 600 Mg Xr12h-tab (Guaifenesin) .... Take 2 tab by mouth two times a day w/ plenty of fluids..Marland KitchenMarland Kitchen6)  Plavix 75 Mg Tabs (Clopidogrel bisulfate) .... Take 1 tablet by mouth once a day 7)  Nitrostat 0.4 Mg Subl (Nitroglycerin) ..Marland Kitchen. 1 tablet under tongue at onset of chest pain; you may repeat every 5 minutes for up to 3 doses. 8)  Isosorbide Mononitrate Cr 60 Mg Xr24h-tab (Isosorbide mononitrate) .... Take 1/2 tablet by mouth daily 9)  Atenolol 50 Mg Tabs (Atenolol) .... Take 1 tablet by mouth once a day 10)  Norvasc 10 Mg Tabs (Amlodipine besylate) .... Take 1 tablet by mouth once a day 11)  Clonidine Hcl 0.1 Mg Tabs (Clonidine hcl) ..Marland Kitchen. 1 tab by mouth two times a day... 12)  Lasix 40 Mg Tabs (Furosemide) .... Take 2 tablets in the am, and take 1 tablet by  mouth in the pm 13)  Potassium Chloride Crys Cr 20 Meq Tbcr (Potassium chloride crys cr) .... Take 1 tab by mouth two times a day... 14)  Lipitor 10 Mg Tabs (Atorvastatin calcium) .... Take one tablet by mouth daily. 15)  Metformin Hcl 500 Mg Tabs (Metformin hcl) .Marland Kitchen.. 1 tab by mouth two times a day... 16)  Omeprazole 40 Mg Cpdr (Omeprazole) .... Take 1 tab by mouth once daily- 30 min before the 1st meal of the day... 17)  Asacol 400 Mg Tbec (Mesalamine) .... Take 4 tabs by mouth three times a day 18)  Canasa 1000 Mg Supp (Mesalamine) .... One suppository into rectum at bedtime 19)  Analpram-hc 1-2.5 % Crea (Hydrocortisone ace-pramoxine) .... Apply a small amount to affected area  twice a day 20)  Promethazine Hcl 25 Mg Tabs (Promethazine hcl) .Marland Kitchen.. 1 by mouth every 4hrs as needed for nausea 21)  Nabumetone 750 Mg Tabs (Nabumetone) .... Take 1 tab by mouth two times a day w/ food... 22)  Parafon Forte Dsc 500 Mg Tabs (Chlorzoxazone) .Marland Kitchen.. 1 tab by mouth 4 times per day as needed for muscle spasm... 23)  Alprazolam 0.5 Mg Tabs (Alprazolam) ....  Take 1 tablet by mouth three times a day not to exceed 3 per day 24)  Vitamin D3 5000 Unit Caps (Cholecalciferol) .... Take one capsule by mouth once daily... 25)  Hydrocodone-acetaminophen 5-500 Mg Tabs (Hydrocodone-acetaminophen) .... Take 1 tab by mouth every 6h as needed for pain... 26)  Tussionex Pennkinetic Er 10-8 Mg/42m Lqcr (Hydrocod polst-chlorphen polst) .... Take 1 tsp by mouth every 12h as needed for cough... 27)  Hydroxyzine Hcl 25 Mg Tabs (Hydroxyzine hcl) .... Take 1 tab by mouth every 4h as needed for itching...  Patient Instructions: 1)  Today we updated your med list- see below.... 2)  We added HYDROXYZINE 264m one tab every 4H as needed for itching... 3)  Follow all prev directions...Marland KitchenMarland Kitchen)  Continue to watch for rash, etc... Prescriptions: HYDROXYZINE HCL 25 MG TABS (HYDROXYZINE HCL) take 1 tab by mouth every 4H as needed for itching...  #50 x 5   Entered and Authorized by:   ScNoralee SpaceD   Signed by:   ScNoralee SpaceD on 05/22/2010   Method used:   Print then Give to Patient   RxID:   [id number]

## 2010-06-01 NOTE — Letter (Signed)
Summary: [facility]  [facility]   Imported By: Phillis Knack 08/23/2009 08:37:42  _____________________________________________________________________  External Attachment:    Type:   Image     Comment:   External Document

## 2010-06-01 NOTE — Assessment & Plan Note (Signed)
Summary: rov 6 months///kp   Primary Care Provider:  Teressa Lower, MD  CC:  `6 month ROV & review of mult medical problems....  History of Present Illness: 70 y/o BF here for a follow up visit... she has mult med problems as noted below...    ~  July 26, 2008:  she is quite anxious about a 1.4cm nodule in LLL that showed up on Urology CTAbd 07/12/08... subseq developed some CP and was [facility] 3/18-19/10 by Cards- felt to be non-cardiac CP w/ neg EKGs & ENZs- no change in meds...  CT Chest 07/22/08 confirmed 1.4cm peripheral LLL nodule that wasn't present on CT in 2005 & no other lesions, +bronchiectasis in both LLs L>R, mod cardiomegaly...  PET Scan 4/10 showed no abn hypermetabolic activity- therefore likely scar or granuloma...   ~  June 02, 2009:  she continues f/u w/ DrStark for GI- on Lialda, Rowasa, & weaning off the Hydrocort enemas... she requests phenergan for nauea... continues f/u w/ DrKatz for Cards- stable on meds, some atyp CP, no angina... BP controlled on meds and Chol is great on the Crestor... needs better diet + exercise to aid weight reduction...  she has mult somatic complaints- hx DJD, FM, Anxiety...   ~  November 29, 2009:  she had an AECOPD 2/11- improved w/ meds... saw Benay Spice 3/11- stable, no changes made... saw Smyer 4/11- thor & lumbar spondylosis- Rx Voltaren gel... she has a complex medical regimen and did't bring list or her bottles to office today> encouraged to bring to each visit for review... her CC is sore all over from the FM, worry about daugh w/ cancer, daugh says "momma drinks too much"...   Current Problem List:          ALLERGY (ICD-995.3) - on Saline, FLONASE 2spBid, SINGULAIR 63m/d... she does not wish to consider decreasing these meds...  ASTHMA (ICD-493.90) - prev on Advair but she stopped on her own... [facility], & NEBULIZER w/ Albut as needed (ave 3x per week)... she is an ex-smoker having quit in 1975, no recent  exacerbations.  BRONCHIECTASIS (ICD-494.0) - CT Chest 3/10 showed bilat LL bronchiectasis L>R...  PULMONARY NODULE (ICD-518.89)  ** see above - neg PET scan 4/10 therefore felt to be scar vs granuloma...  ~  CXR 2/11 showed cardiomeg, some COPD, NAD- LLL nodule seen on CT 3/10 is not vis on CXR.  HYPERTENSION (ICD-401.9) - controlled on ATENOLOL 527md, NORVASC 1062m, CLONIDINE 0.1mg76m, LASIX 40mg87mtabs/d (2AM, 1PM), KCL 20/d... BP=118/78 today, not checking BP at home... denies HAs, visual changes, palipit, dizziness, syncope, dyspnea, edema, etc...  ~  labs 4/10 showed K= 3.3 but she never increased KCl to Bid.  ~  labs 2/11 showed K= 3.5, rec> incr KCl to Bid...  CORONARY ARTERY DISEASE (ICD-414.00) - on PLAVIX 75mg/11mIMDUR 30mg- 33mtab daily...  ~  she is s/p PTCA & stent to the LAD on 10/02...  ~  last cath 2/07 w/ patent LAD stent, non-obstructive dis otherwise, EF=65%...  ~  last NuclearStressTest 5/08 showed no ischemia, EF=64% (similiar to 2005)...  ~  hosp in Jun09 w/ CP- cath showing non-obstructive CAD w/ 25-40% lesions in all 3 vessels, prev stent in LAD patent, norm LVF..  CHESMarland Kitchen PAIN, ATYPICAL (ICD-786.59) - she has chronic CWP, prob related to her fibromyalgia...  HYPERCHOLESTEROLEMIA (ICD-272.0) - on LIPITOR 10mg/d,68miet Rx..  ~  [place] 132, TG 58, HDL 60,  LDL 60  ~  FLP 7/09 showed TChol 116, TG 49, HDL 49, LDL 57  ~  FLP in hosp 3/10 showed TChol 130, TG 55, HDL 53, LDL 66  ~  FLP 2/11 on Cres5 showed TChol 104, TG 67, HDL 50, LDL 40  ~  Crestor 38m changed to Lipitor 142mper insurance co.  DIABETES MELLITUS (ICD-250.00) - on METFORMIN 50042md, + low carb diet...  ~  last labs 11/08 showed BS=120, HgA1c=6.2  ~  labs 6/09 in hosp showed BS=110-120, HgA1c= 6.6... Marland KitchenMarland Kitchenme meds, needs better diet!  ~  labs 3/10 in hosp showed BS= 113-155, HgA1c= 6.7  ~  labs 2/11 showed BS= 108, A1c= 7.1  OBESITY (ICD-278.00) -   ~  weight 11/09 = 204#  ~   weight 3/10 = 208#  ~  weight 2/11 = 205#... we reviewed diet + exercise program.  GERD (ICD-530.81) - on PRILOSEC 29m22making 1-2 daily...  DIVERTICULOSIS OF COLON (ICD-562.10) & COLONIC POLYPS (ICD-211.3) - last colonoscopy was 7/08 w/ divertics & colitis seen, + hyperplastic polyp removed...  Hx of ULCERATIVE COLITIS (ICD-556.9) - followed by DrStark for GI- on LIALDA 2.4gm/d, ROWASA Qhs, & off Hydrocort enemas...  MICROSCOPIC HEMATURIA (ICD-599.72),  NEOPLASM, KIDNEY (ICD-239.5),  STRESS INCONTINENCE (ICD-788.39) - eval and Rx by DrWrenn 9/09 w/ stress incontinence, dysuria w/ microscopic hematuria-  Cysto w/ bladder bx was neg; and CT + Sonar w/ 1.8cm left upper pole renal lesion of ?etiology- poss tumor vs cyst (min enhancement, solid on sonar w/ min blood flow + 2 sm cysts also seen)...  they are following a watchful waiting protocol & repeat CT 3/10 showed stable 1.7cm category II cyst upper pole of left kidney without change... f/u planned 37yr.837yrGENERATIVE JOINT DISEASE (ICD-715.90) - she is followed by DrVoytek... RELAFEN 750mg 38mPrn & PARAFON FORTE Tid Prn...  FIBROMYALGIA (ICD-729.1)  VITAMIN D DEFICIENCY (ICD-268.9) - Vit D level 2/11= 11 and pt rec to take Vit D 50000 u weekly but ?never filled this Rx... therefore rec 8/11 to switch to OTC Vit D 5000 u daily from the health food store...   ANXIETY (ICD-300.00) - she has lots of stress caring for Momma... on ALPRAZOLAM 0.5mg Ti41mrn...  Hx of ANEMIA (ICD-285.9) - on NUIRON 150mg/d.23m ~  labs 11/08 showed Hg=12.2, Fe=38/ TIBC=287/ sat=9%...  ~  labs 3/10 in hosp showed Hg= 13.0, MCV= 92...  ~  labs 2/11 showed Hg= 12.7, Fe= 67...   Preventive Screening-Counseling & Management  Alcohol-Tobacco     Smoking Status: quit     Packs/Day: 2ppd     Year Started: 1960     Year Quit: 1975  Caffeine-Diet-Exercise     Does Patient Exercise: yes  Allergies: 1)  ! Codeine 2)  ! Cipro 3)  ! Zithromax (Azithromycin) 4)  !  Tramadol Hcl (Tramadol Hcl) 5)  ! Levaquin (Levofloxacin) 6)  ! Asa 7)  ! Tylox  Comments:  Nurse/Medical Assistant: [name] medications and allergies were reviewed with the patient and were updated in the Medication and Allergy Lists.  Past History:  Past Medical History: ALLERGY (ICD-995.3) ASTHMA (ICD-493.90) BRONCHIECTASIS (ICD-494.0) PULMONARY NODULE (ICD-518.89) HYPERTENSION (ICD-401.9) CORONARY ARTERY DISEASE (ICD-414.00) CHEST PAIN, ATYPICAL (ICD-786.IRS-854.62LIC HEART FAILURE, CHRONIC (ICD-428.32) HYPERCHOLESTEROLEMIA (ICD-272.0) DIABETES MELLITUS (ICD-250.00) OBESITY (ICD-278.00) GERD (ICD-530.81) DIVERTICULOSIS OF COLON (ICD-562.10) Hx of ULCERATIVE COLITIS (ICD-556.9) COLONIC POLYPS (ICD-211.3) MICROSCOPIC HEMATURIA (ICD-599.72) NEOPLASM, KIDNEY (ICD-239.5) STRESS INCONTINENCE (ICD-788.39) DEGENERATIVE JOINT DISEASE (ICD-715.90) FIBROMYALGIA (ICD-729.1) VITAMIN D DEFICIENCY (  ICD-268.9) ANXIETY (ICD-300.00) Hx of ANEMIA (ICD-285.9)  Past Surgical History: S/P hysterectomy S/P cholecystectomy S/P right hand surgery 10/07 by Dr. Amedeo Plenty  Family History: Reviewed history from 07/26/2008 and no changes required. Father died age 53 w/ ASHD/ MI, pneumonia, arthritis Mother alive age 59 w/ severe parkinson's disease 2 Siblings- Bro died w/ sarcoid, DM Sis, Tami Lin, age 4 has HBP, Chol, DM No FH of Colon Cancer, but father had colon polyps  Social History: Reviewed history from 07/26/2008 and no changes required. Divorced ex-smoker: smoked for 10-26yr up to 2ppd, quit 1975 social alcohol not exercising retired Packs/Day:  2ppd  Review of Systems      See HPI       The patient complains of dyspnea on exertion, headaches, abdominal pain, muscle weakness, and difficulty walking.  The patient denies anorexia, fever, weight loss, weight gain, vision loss, decreased hearing, hoarseness, chest pain, syncope, peripheral edema, prolonged cough,  hemoptysis, melena, hematochezia, severe indigestion/heartburn, hematuria, incontinence, suspicious skin lesions, transient blindness, depression, unusual weight change, abnormal bleeding, enlarged lymph nodes, and angioedema.         She has mult somatic complaints...   Vital Signs:  Patient profile:   70year old female Height:      62 inches Weight:      204.38 pounds BMI:     37.52 O2 Sat:      96 % on room air Temp:     97.6 degrees F oral Pulse rate:   71 / minute BP sitting:   118 / 78  (right arm) Cuff size:   regular  Vitals Entered By: LElita BooneCMA (November 29, 2009 10:34 AM)  O2 Sat at Rest %:  96 O2 Flow:  room air CC: `6 month ROV & review of mult medical problems... Is Patient Diabetic? Yes Pain Assessment Patient in pain? yes      Onset of pain  all in her back from the RA Comments meds updated today   Physical Exam  Additional Exam:  WD, Obese, 70y/o BF in NAD... GENERAL:  Alert & oriented; pleasant & cooperative... HEENT:  [place]/AT, EOM-wnl, PERRLA, EACs-clear, TMs-wnl, NOSE-clear, THROAT-clear & wnl. NECK:  Supple w/ fairROM; no JVD; normal carotid impulses w/o bruits; no thyromegaly or nodules palpated; no lymphadenopathy. CHEST:  Clear to P & A; without wheezes/ rales/ or rhonchi... +trigger points along trapezius area and chest wall. HEART:  Regular Rhythm; without murmurs/ rubs/ or gallops heard... ABDOMEN:  Obese, soft & nontender; normal bowel sounds; no organomegaly or masses detected. EXT: without deformities, mild arthritic changes; no varicose veins/ +venous insuffic/ tr edema. NEURO:  CN's intact;  no focal neuro deficits...    Impression & Recommendations:  Problem # 1:  PULM>>> She has hx asthma/ allergies, COPD/ bronchiectasis, ?pulm nodule on CT 3/10 w/ neg PET & f/u CXR 2/11 w/o nodule seen...  Problem # 2:  CARDIAC>>> She has HBP, CAD, Hx atyp CP, and a complex medical regimen... followed by DBenay Spice  Problem # 3:   HYPERCHOLESTEROLEMIA (ICD-272.0) Stable on the Lip10... Her updated medication list for this problem includes:    Lipitor 10 Mg Tabs (Atorvastatin calcium) ..Marland Kitchen.. Take one tablet by mouth daily.  Problem # 4:  DIABETES MELLITUS (ICD-250.00) Stable on diet + Metformin... Her updated medication list for this problem includes:    Metformin Hcl 500 Mg Tabs (Metformin hcl) ..Marland Kitchen.. 1 tab by mouth two times a day...  Problem # 5:  Hx of ULCERATIVE COLITIS (  ICD-556.9) GI followed by DrStark... she is encouraged to f/u w/ GI & bring all med bottles to the OV...  Problem # 6:  MICROSCOPIC HEMATURIA (ICD-599.72) GU per DrWrenn>  continue Rx...  Problem # 7:  RHEUM>>> She has DJD, FM, Vit D defic... rec to keep in touch w/ Ortho, DrVoytek & take the OTC Vit D 5000 u daily.  Problem # 8:  OTHER MEDICAL PROBLEMS AS NOTED>>>  Complete Medication List: 1)  Flonase 50 Mcg/act Susp (Fluticasone propionate) .... 2 sprays in each nostril two times a day... 2)  Singulair 10 Mg Tabs (Montelukast sodium) .... Take 1 tablet by mouth once a day 3)  Mucinex 600 Mg Xr12h-tab (Guaifenesin) .... Take 2 tab by mouth two times a day w/ plenty of fluids.Marland KitchenMarland Kitchen 4)  Albuterol Sulfate (2.5 Mg/72m) 0.083% Nebu (Albuterol sulfate) .... Use in nebulizer up to three times per day as needed for wheezing... 5)  Plavix 75 Mg Tabs (Clopidogrel bisulfate) .... Take 1 tablet by mouth once a day 6)  Nitrostat 0.4 Mg Subl (Nitroglycerin) ..Marland Kitchen. 1 tablet under tongue at onset of chest pain; you may repeat every 5 minutes for up to 3 doses. 7)  Isosorbide Mononitrate Cr 60 Mg Xr24h-tab (Isosorbide mononitrate) .... Take 1/2 tablet by mouth daily 8)  Atenolol 50 Mg Tabs (Atenolol) .... Take 1 tablet by mouth once a day 9)  Norvasc 10 Mg Tabs (Amlodipine besylate) .... Take 1 tablet by mouth once a day 10)  Clonidine Hcl 0.1 Mg Tabs (Clonidine hcl) ..Marland Kitchen. 1 tab by mouth two times a day... 11)  Lasix 40 Mg Tabs (Furosemide) .... Take 2 tablets in  the am, and take 1 tablet by mouth in the pm 12)  Potassium Chloride Crys Cr 20 Meq Tbcr (Potassium chloride crys cr) .... Take 1 tab by mouth two times a day... 13)  Lipitor 10 Mg Tabs (Atorvastatin calcium) .... Take one tablet by mouth daily. 14)  Metformin Hcl 500 Mg Tabs (Metformin hcl) ..Marland Kitchen. 1 tab by mouth two times a day... 15)  Prilosec Otc 20 Mg Tbec (Omeprazole magnesium) .... Take 1 tab by mouth two times a day... 16)  Asacol 400 Mg Tbec (Mesalamine) .... Take 4 tabs by mouth two times a day as directed by drstark... 17)  Analpram-hc 1-2.5 % Crea (Hydrocortisone ace-pramoxine) .... Apply a small amount to affected area  twice a day 18)  Nabumetone 750 Mg Tabs (Nabumetone) .... Take 1 tab by mouth two times a day w/ food... 19)  Parafon Forte Dsc 500 Mg Tabs (Chlorzoxazone) ..Marland Kitchen. 1 tab by mouth 4 times per day as needed for muscle spasm... 20)  Alprazolam 0.5 Mg Tabs (Alprazolam) .... Take 1 tablet by mouth three times a day not to exceed 3 per day 21)  Promethazine Hcl 25 Mg Tabs (Promethazine hcl) ..Marland Kitchen. 1 by mouth every 4hrs as needed for nausea 22)  Hydromet 5-1.5 Mg/591mSyrp (Hydrocodone-homatropine) ...Marland Kitchen 1-2 tsp every 4-6 hr as needed cough 23)  Vitamin D3 5000 Unit Caps (Cholecalciferol) .... Take one capsule by mouth once daily...  Patient Instructions: 1)  Today we updated your med list- see below.... 2)  Continue your current meds the same w/ one change: stop the Vit D 50,000 u once weekly pill in favor of an OTC 5000 u Vit D capsule daily (drug store or health food store supplement daily)...Marland KitchenMarland Kitchen)  Let's get on track w/ our diet & exercise program>> the end result is losing some weight!!! 4)  Call for any questions.Marland KitchenMarland Kitchen 5)  Please schedule a follow-up appointment in 6 months, & we will plan f/u CXR & FASTING blood work at that time.Marland KitchenMarland Kitchen

## 2010-06-01 NOTE — Progress Notes (Signed)
Summary: appt  Phone Note Call from Patient Call back at [phone]   Caller: Patient Call For: nadel Summary of Call: Pt states she needs a hfu with Dr. Lenna Gilford today pls advise. Initial call taken by: Netta Neat,  May 17, 2010 8:57 AM  Follow-up for Phone Call        Pt c/o left side rib pain, vomiting, productive cough with thick yellow mucus traced with blood. Pt was seen and released from [facility] ED on Sunday. Please advise. Thanks. Iran Planas CMA  May 17, 2010 9:28 AM   Additional Follow-up for Phone Call Additional follow up Details #1::        per SN---ok to add pt on for thursday afternoon at 2 .  thanks [place]  May 17, 2010 12:43 PM     Additional Follow-up for Phone Call Additional follow up Details #2::    Spoke with pt and sched her for appt with SN for tommorrow at 2 pm and advised that she should go to ED sooner if needed.  Pt verbalized understanding. Follow-up by: Tilden Dome,  May 17, 2010 1:50 PM

## 2010-06-01 NOTE — Progress Notes (Signed)
Summary: prescript  Phone Note Call from Patient   Caller: Patient Call For: Terin Cragle Summary of Call: need phenergan prescript for nausea [facility] Initial call taken by: Gustavus Bryant,  June 02, 2009 2:10 PM  Follow-up for Phone Call        This med is not on pt's current med list.  Will forward to SN-please advise if this is ok.  Thanks! Raymondo Band RN  June 02, 2009 2:26 PM   per SN---ok to send the phenergan 59m  #30  1 by mouth every 6 hours as needed for nausea refill x 5 to her pharmacy---this has been done and pt is aware LElita BooneCMA  June 02, 2009 5:04 PM     New/Updated Medications: PROMETHAZINE HCL 25 MG TABS (PROMETHAZINE HCL) take one tablet by mouth every 6 hours as needed for nausea Prescriptions: PROMETHAZINE HCL 25 MG TABS (PROMETHAZINE HCL) take one tablet by mouth every 6 hours as needed for nausea  #30 x 5   Entered by:   LElita BooneCMA   Authorized by:   SNoralee SpaceMD   Signed by:   LElita BooneCMA on 06/02/2009   Method used:   Telephoned to ...       [facility] [address] [phone] (retail)       [address]      [place]  [address]      Ph: [phone] [phone]      Fax: [phone]  RxID:   [id number]
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
## 2010-06-01 NOTE — Assessment & Plan Note (Signed)
Summary: rov per pt ///kp   Primary Care Provider:  Teressa Lower, MD  CC:  11 month ROV & review of mult medical problems....  History of Present Illness: 70 y/o BF here for a follow up visit... she has mult med problems as noted below...    ~  Followed for Cardiology by Benay Spice- she was hosp in Jun09 w/ CP and had cath showing non-obstructive CAD w/ 25-40% lesions in all 3 vessels and prev stent in LAD patent, norm LVF...  she also has Fibromyalgia & DJD- Rx w/ Relafen 765mBid + Tramadol Prn + Parafon Prn... the Tramadol makes her feel spaced-out, therefore stopped... [facility]   ~  Nov09:  f/u visit doing OK-  she saw DrStark in Sep09 & stable Ulc Colitis on Lialda 2.4gm/d...  she saw DrWrenn on several occas w/ eval for stress incontinence, dysuria w/ microscopic hematuria-  Cysto w/ bladder bx was neg;  CT Abd + Sonar w/ 1.8cm lesion left upper pole ?etiology- poss tumor vs cyst (min enhancement, solid on sonar w/ min blood flow + 2 sm cysts also seen)... she had f/u DrKatz 11/09- doing well...   ~  July 26, 2008:  she is quite anxious about a 1.4cm nodule in LLL that showed up on Urology CTAbd 07/12/08... subseq developed some CP and [facility]- felt to be non-cardiac CP w/ neg EKGs & ENZs- no change in meds...  CT Chest 07/22/08 confirmed 1.4cm peripheral LLL nodule that wasn't present on CT in 2005 & no other lesions, +bronchiectasis in both LLs L>R, mod cardiomegaly...  PET Scan 4/10 showed no abn hypermetabolic activity- therefore likely scar or granuloma...   ~  June 02, 2009:  she continues f/u w/ drStark for GI- on Lialda, Rowasa, & weaning off the Hydrocort enemas... she requests phenergan for nauea... continues f/u w/ drKatz for Cards- stable on meds, some atyp CP, no angina... BP controlled on meds and Chol is great on the Crestor... needs better diet + exercise to aid weight reduction...  she has mult somatic complaints- hx DJD,  FM, Anxiety...   Current Problem List:          ALLERGY (ICD-995.3) - on Saline, FLONASE 2spBid, SINGULAIR 123md... she does not wish to consider decreasing these meds...  ASTHMA (ICD-493.90) - prev on Advair but she stopped on her own... [facility], & NEBULIZER w/ Albut as needed (ave 3x per week)... she is an ex-smoker having quit in 1975, no recent exacerbations.  BRONCHIECTASIS (ICD-494.0) - CT Chest 3/10 showed bilat LL bronchiectasis L>R...  PULMONARY NODULE (ICD-518.89)  ** see above - neg PET scan 4/10 therefore felt to be scar vs granuloma...  HYPERTENSION (ICD-401.9) - controlled on ATENOLOL 505m, NORVASC 17m77m CLONIDINE 0.1mgB84m LASIX 40mg-47mabs/d (2AM, 1PM), KCL 20/d... BP=126/84 today, not checking BP at home... denies HAs, visual changes, palipit, dizziness, syncope, dyspnea, edema, etc...  ~  labs 4/10 showed K= 3.3 but she never increased KCl to Bid.  ~  labs 2/11 showed K= 3.5, rec> incr KCl to Bid...  CORONARY ARTERY DISEASE (ICD-414.00) - on PLAVIX 75mg/d69mMDUR 30mg- 168mab daily...  ~  she is s/p PTCA & stent to the LAD on 10/02...  ~  last cath 2/07 w/ patent LAD stent, non-obstructive dis otherwise, EF=65%...  ~  last NuclearStressTest 5/08 showed no ischemia, EF=64% (similiar to 2005)...  ~  hosp in Jun09 w/  CP- cath showing non-obstructive CAD w/ 25-40% lesions in all 3 vessels, prev stent in LAD patent, norm LVF.Marland Kitchen  CHEST PAIN, ATYPICAL (ICD-786.59) - she has chronic CWP, prob related to her fibromyalgia...  HYPERCHOLESTEROLEMIA (ICD-272.0) - on CRESTOR 74m/d, + diet Rx..  ~  FLyon11/08 showed TChol 132, TG 58, HDL 60, LDL 60  ~  FLP 7/09 showed TChol 116, TG 49, HDL 49, LDL 57  ~  FLP in hosp 3/10 showed TChol 130, TG 55, HDL 53, LDL 66  ~  FLP 2/11 on Cres5 showed TChol 104, TG 67, HDL 50, LDL 40  DIABETES MELLITUS (ICD-250.00) - on METFORMIN 5075mid, + low carb diet...  ~  last labs 11/08 showed BS=120, HgA1c=6.2  ~  labs 6/09  in hosp showed BS=110-120, HgA1c= 6.6...Marland KitchenMarland Kitchename meds, needs better diet!  ~  labs 3/10 in hosp showed BS= 113-155, HgA1c= 6.7  ~  labs 2/11 showed BS= 108, A1c= 7.1  OBESITY (ICD-278.00) -   ~  weight 11/09 = 204#  ~  weight 3/10 = 208#  ~  weight 2/11 = 205#... we reviewed diet + exercise program.  GERD (ICD-530.81) - on PRILOSEC 2045mtaking 1-2 daily...  DIVERTICULOSIS OF COLON (ICD-562.10) & COLONIC POLYPS (ICD-211.3) - last colonoscopy was 7/08 w/ divertics & colitis seen, + hyperplastic polyp removed...  Hx of ULCERATIVE COLITIS (ICD-556.9) - followed by drStark for GI- on LIALDA 2.4gm/d, ROWASA Qhs, & weaning off Hydrocort enemas...  MICROSCOPIC HEMATURIA (ICD-599.72),  NEOPLASM, KIDNEY (ICD-239.5),  STRESS INCONTINENCE (ICD-788.39) - eval and Rx by DrWrenn 9/09 w/ 1.8cm left upper pole renal lesion of ?etiology... they are following a watchful waiting protocol & repeat CT 3/10 showed stable 1.7cm category II cyst upper pole of left kidney without change... f/u planned 13yr27yrEGENERATIVE JOINT DISEASE (ICD-715.90) - she is followed by DrVoytek... RELAFEN 750mg71m Prn & PARAFON FORTE Tid Prn...  FIBROMYALGIA (ICD-729.1)  ANXIETY (ICD-300.00) - she has lots of stress caring for Momma... on ALPRAZOLAM 0.5mg T59mPrn...  Hx of ANEMIA (ICD-285.9) - on NUIRON 150mg/d11m  ~  labs 11/08 showed Hg=12.2, Fe=38/ TIBC=287/ sat=9%...  ~  labs 3/10 in hosp showed Hg= 13.0, MCV= 92...  ~  labs 2/11 showed Hg= 12.7, Fe= 67...    Allergies: 1)  ! Codeine 2)  ! Cipro 3)  ! Zithromax (Azithromycin) 4)  ! Tramadol Hcl (Tramadol Hcl) 5)  ! Levaquin (Levofloxacin) 6)  ! Asa 7)  ! Tylox  Comments:  Nurse/Medical Assistant: [name] medications and allergies were reviewed with the patient and were updated in the Medication and Allergy Lists.  Past History:  Past Medical History:  ALLERGY (ICD-995.3) ASTHMA (ICD-493.90) BRONCHIECTASIS (ICD-494.0) PULMONARY NODULE  (ICD-518.89) HYPERTENSION (ICD-401.9) CORONARY ARTERY DISEASE (ICD-414.00) CHEST PAIN, ATYPICAL (ICD-786ZTI-458.09OLIC HEART FAILURE, CHRONIC (ICD-428.32) HYPERCHOLESTEROLEMIA (ICD-272.0) DIABETES MELLITUS (ICD-250.00) OBESITY (ICD-278.00) GERD (ICD-530.81) DIVERTICULOSIS OF COLON (ICD-562.10) Hx of ULCERATIVE COLITIS (ICD-556.9) COLONIC POLYPS (ICD-211.3) MICROSCOPIC HEMATURIA (ICD-599.72) NEOPLASM, KIDNEY (ICD-239.5) STRESS INCONTINENCE (ICD-788.39) DEGENERATIVE JOINT DISEASE (ICD-715.90) FIBROMYALGIA (ICD-729.1) ANXIETY (ICD-300.00) Hx of ANEMIA (ICD-285.9)  Past Surgical History: S/P hysterectomy S/P cholecystectomy S/P right hand surgery 10/07 by Dr. Gramig Amedeo Plentyy History: Reviewed history from 07/26/2008 and no changes required. Father died age 42 w/ A29D/ MI, pneumonia, arthritis Mother alive age 46 w/ s27ere parkinson's disease 2 Siblings- Bro died w/ sarcoid, DM Sis, Gladys Tami Lin4 has 82P, Chol, DM No FH of Colon Cancer, but father had colon polyps  Social History: Reviewed history from  07/26/2008 and no changes required. Divorced ex-smoker: smoked for 10-19yr up to 2ppd, quit 1975 social alcohol not exercising retired  Review of Systems      See HPI       The patient complains of chest pain and dyspnea on exertion.  The patient denies anorexia, fever, weight loss, weight gain, vision loss, decreased hearing, hoarseness, syncope, peripheral edema, prolonged cough, headaches, hemoptysis, abdominal pain, melena, hematochezia, severe indigestion/heartburn, hematuria, incontinence, muscle weakness, suspicious skin lesions, transient blindness, difficulty walking, depression, unusual weight change, abnormal bleeding, enlarged lymph nodes, and angioedema.    Vital Signs:  Patient profile:   70year old female Height:      62 inches Weight:      204.50 pounds O2 Sat:      96 % on Room air Temp:     98.9 degrees F oral Pulse rate:   65 / minute BP  sitting:   126 / 84  (left arm) Cuff size:   regular  Vitals Entered By: LElita BooneCMA (June 02, 2009 11:52 AM)  O2 Sat at Rest %:  96 O2 Flow:  Room air CC: 11 month ROV & review of mult medical problems... Comments meds updated today   Physical Exam  Additional Exam:  WD, Obese, 70y/o BF in NAD... GENERAL:  Alert & oriented; pleasant & cooperative... HEENT:  [place]/AT, EOM-wnl, PERRLA, EACs-clear, TMs-wnl, NOSE-clear, THROAT-clear & wnl. NECK:  Supple w/ fairROM; no JVD; normal carotid impulses w/o bruits; no thyromegaly or nodules palpated; no lymphadenopathy. CHEST:  Clear to P & A; without wheezes/ rales/ or rhonchi... +trigger points along trapezius area and chest wall. HEART:  Regular Rhythm; without murmurs/ rubs/ or gallops heard... ABDOMEN:  Obese, soft & nontender; normal bowel sounds; no organomegaly or masses detected. EXT: without deformities, mild arthritic changes; no varicose veins/ +venous insuffic/ tr edema. NEURO:  CN's intact;  no focal neuro deficits...     CXR  Procedure date:  06/02/2009  Findings:      CHEST - 2 VIEW   Comparison: 01/17/2009.  07/22/2008.   Findings: There is moderate enlargement cardiac silhouette. Ectasia and nonaneurysmal calcification of the thoracic aorta is seen. No pulmonary edema, pneumonia, or pleural effusion is seen. Slight flattening of the diaphragm on lateral image may reflect minimal hyperinflation. There is a mildly osteopenic appearance of the bones. There is mild degenerative spondylosis compatible with age.   On CT examination of 07/22/2008 a peripheral nodular density was identified within the peripheral aspect of the left lower lobe.  I cannot definitely visualize this by plain imaging .   IMPRESSION: The cardiac silhouette is moderately enlarged. No acute cardiopulmonary process is identified. There is slight chronic hyperinflation configuration.   On CT examination of 07/22/2008 a peripheral  nodular density was identified within the peripheral aspect of the left lower lobe.  I cannot definitely visualize this by plain imaging .   Read By:  CDelane Ginger  [name]       Impression & Recommendations:  Problem # 1:  ASTHMA (ICD-493.90) Stable-  she has way too many meds and can't take them regularly... she is off the Advair & we are following.. The following medications were removed from the medication list:    Advair Diskus 100-50 Mcg/dose Misc (Fluticasone-salmeterol) ..Marland Kitchen.. Take one puff two times a day Her updated medication list for this problem includes:    Singulair 10 Mg Tabs (Montelukast sodium) ..Marland Kitchen.. Take 1 tablet by mouth once a day  Albuterol Sulfate (2.5 Mg/72m) 0.083% Nebu (Albuterol sulfate) ..... Use in nebulizer up to three times per day as needed for wheezing...  Problem # 2:  PULMONARY NODULE (ICD-518.89) Not seen on plain films-  c/w scar vs granuloma (prev PET neg)... Orders: T-2 View CXR ([id number]  Problem # 3:  HYPERTENSION (ICD-401.9) Controlled-  same meds, get weight down... Her updated medication list for this problem includes:    Atenolol 50 Mg Tabs (Atenolol) ..Marland Kitchen.. Take 1 tablet by mouth once a day    Norvasc 10 Mg Tabs (Amlodipine besylate) ..Marland Kitchen.. Take 1 tablet by mouth once a day    Clonidine Hcl 0.1 Mg Tabs (Clonidine hcl) ..Marland Kitchen.. 1 tab by mouth two times a day...    Lasix 40 Mg Tabs (Furosemide) ..Marland Kitchen.. Take 2 tablets in the am, and take 1 tablet by mouth in the pm  Orders: TLB-Lipid Panel (80061-LIPID) TLB-BMP (Basic Metabolic Panel-BMET) (822449-PNPYYFR TLB-Hepatic/Liver Function Pnl (80076-HEPATIC) TLB-CBC Platelet - w/Differential (85025-CBCD) TLB-TSH (Thyroid Stimulating Hormone) (84443-TSH) TLB-A1C / Hgb A1C (Glycohemoglobin) (83036-A1C) TLB-BNP (B-Natriuretic Peptide) (83880-BNPR) TLB-IBC Pnl (Iron/FE;Transferrin) (83550-IBC) T-Vitamin D (25-Hydroxy) ((10211-17356  Problem # 4:  CORONARY ARTERY DISEASE (ICD-414.00) Stable-  no angina,  continue f/u w/ Cards. Her updated medication list for this problem includes:    Plavix 75 Mg Tabs (Clopidogrel bisulfate) ..Marland Kitchen.. Take 1 tablet by mouth once a day    Nitrostat 0.4 Mg Subl (Nitroglycerin) ..Marland Kitchen.. 1 tablet under tongue at onset of chest pain; you may repeat every 5 minutes for up to 3 doses.    Isosorbide Dinitrate 30 Mg Tabs (Isosorbide dinitrate) ..Marland Kitchen.. Take 1/2 tablet once daily    Atenolol 50 Mg Tabs (Atenolol) ..Marland Kitchen.. Take 1 tablet by mouth once a day    Norvasc 10 Mg Tabs (Amlodipine besylate) ..Marland Kitchen.. Take 1 tablet by mouth once a day    Clonidine Hcl 0.1 Mg Tabs (Clonidine hcl) ..Marland Kitchen.. 1 tab by mouth two times a day...    Lasix 40 Mg Tabs (Furosemide) ..Marland Kitchen.. Take 2 tablets in the am, and take 1 tablet by mouth in the pm  Problem # 5:  HYPERCHOLESTEROLEMIA (ICD-272.0) Doing very well on low dose Crestor-  continue same. The following medications were removed from the medication list:    Questran 4 Gm/dose Powd (Cholestyramine) ..Marland Kitchen.. 1 tablesp in water daily as directed for diarrhea... Her updated medication list for this problem includes:    Crestor 5 Mg Tabs (Rosuvastatin calcium) ..Marland Kitchen.. Take 1 tablet by mouth once a day  Problem # 6:  DIABETES MELLITUS (ICD-250.00) Needs better diet, incr exercise, not more meds. Her updated medication list for this problem includes:    Metformin Hcl 500 Mg Tabs (Metformin hcl) ..Marland Kitchen.. 1 tab by mouth two times a day...  Problem # 7:  OBESITY (ICD-278.00) Weight reduction is the key...  Problem # 8:  Hx of ULCERATIVE COLITIS (ICD-556.9) Followed by DrStark-  continue his meds.  Problem # 9:  OTHER MEDICAL PROBLEMS AS NOTED>>> Vit D level returns very low-  start 50,000 u weekly.  Complete Medication List: 1)  Flonase 50 Mcg/act Susp (Fluticasone propionate) .... 2 sprays in each nostril two times a day... 2)  Singulair 10 Mg Tabs (Montelukast sodium) .... Take 1 tablet by mouth once a day 3)  Mucinex 600 Mg Xr12h-tab (Guaifenesin) .... Take  2 tab by mouth two times a day w/ plenty of fluids..Marland KitchenMarland Kitchen4)  Albuterol Sulfate (2.5 Mg/338m 0.083% Nebu (Albuterol sulfate) .... Use in nebulizer up to three times per  day as needed for wheezing... 5)  Plavix 75 Mg Tabs (Clopidogrel bisulfate) .... Take 1 tablet by mouth once a day 6)  Nitrostat 0.4 Mg Subl (Nitroglycerin) .Marland Kitchen.. 1 tablet under tongue at onset of chest pain; you may repeat every 5 minutes for up to 3 doses. 7)  Isosorbide Dinitrate 30 Mg Tabs (Isosorbide dinitrate) .... Take 1/2 tablet once daily 8)  Atenolol 50 Mg Tabs (Atenolol) .... Take 1 tablet by mouth once a day 9)  Norvasc 10 Mg Tabs (Amlodipine besylate) .... Take 1 tablet by mouth once a day 10)  Clonidine Hcl 0.1 Mg Tabs (Clonidine hcl) .Marland Kitchen.. 1 tab by mouth two times a day... 11)  Lasix 40 Mg Tabs (Furosemide) .... Take 2 tablets in the am, and take 1 tablet by mouth in the pm 12)  Potassium Chloride Crys Cr 20 Meq Tbcr (Potassium chloride crys cr) .... Take 1 tab by mouth two times a day... 13)  Crestor 5 Mg Tabs (Rosuvastatin calcium) .... Take 1 tablet by mouth once a day 14)  Metformin Hcl 500 Mg Tabs (Metformin hcl) .Marland Kitchen.. 1 tab by mouth two times a day... 15)  Prilosec Otc 20 Mg Tbec (Omeprazole magnesium) .... Take 1 tab by mouth two times a day... 16)  Lialda 1.2 Gm Tbec (Mesalamine) .... Take 1 tab by mouth two times a day... 17)  Rowasa 4 Gm Kit (Mesalamine-cleanser) .... Insert 1 enema into rectum at bedtime 18)  Analpram-hc 1-2.5 % Crea (Hydrocortisone ace-pramoxine) .... Apply a small amount to affected area  twice a day 19)  Nabumetone 750 Mg Tabs (Nabumetone) .... Take 1 tab by mouth two times a day w/ food... 20)  Parafon Forte Dsc 500 Mg Tabs (Chlorzoxazone) .Marland Kitchen.. 1 tab by mouth 4 times per day as needed for muscle spasm... 21)  Alprazolam 0.5 Mg Tabs (Alprazolam) .... Take 1 tablet by mouth three times a day not to exceed 3 per day 22)  Hydrocortisone 100 Mg/84m Enem (Hydrocortisone) .... Insert 1 enema into  rectum every morning. (use rowasa suppositories at bedtime). pharmacy-please d/c rx for hydrocortisone at bedtime! 23)  Cortisporin-tc 3.06-30-08-0.5 Mg/ml Susp (Neomycin-colist-hc-thonzonium) .... 2 drops in affected ear twice a day as needed 24)  Promethazine Hcl 25 Mg Tabs (Promethazine hcl) ..Marland Kitchen. 1 by mouth every 4hrs as needed for nausea  Other Orders: Prescription Created Electronically [phone]  Patient Instructions: 1)  Today we updated your med list- see below.... 2)  Continue your current meds the same... 3)  Continue your GI meds from DrStark... 4)  [facility]but we increased the KCl to twice daily)..Marland KitchenMarland Kitchen5)  Today we did your follow up CXR & FASTING blood work... please call the "phone tree" in a few days for your lab results..Marland KitchenMarland Kitchen 6)  Let's plan a follow up visit in 6 months, sooner as needed... Prescriptions: ANALPRAM-HC 1-2.5 % CREA (HYDROCORTISONE ACE-PRAMOXINE) Apply a small amount to affected area  twice a day  #1 tube x prn   Entered and Authorized by:   SNoralee SpaceMD   Signed by:   SNoralee SpaceMD on 06/02/2009   Method used:   Print then Give to Patient   RxID:   [id number] CHLORIDE CRYS CR 20 MEQ  TBCR (POTASSIUM CHLORIDE CRYS CR) take 1 tab by mouth two times a day...  #60 x prn   Entered and Authorized by:   SNoralee SpaceMD   Signed by:   SDeborra Medina  Lenna Gilford MD on 06/02/2009   Method used:   Print then Give to Patient   RxID:   [id number]

## 2010-06-01 NOTE — Medication Information (Signed)
Summary: [place]   Imported By: Rise Patience 03/30/2010 16:11:48  _____________________________________________________________________  External Attachment:    Type:   Image     Comment:   External Document

## 2010-06-01 NOTE — Progress Notes (Signed)
Summary: REQUESTING ABX  Phone Note Call from Patient Call back at [phone]   Summary of Call: spoke with pt about her lab results and then pt wanted to know if she could get some amoxicillin called in for her--she stated that her granddaughter has the flu and she wants to take this as a preventative---she stated that she has a severe dry cough, no fever or body aches.  i explained to her that the abx will not help with the flu symptoms.  she then stated that her granddaughter does not have the flu, but she is sick and she does not want to get what she has.  please advise if ok to send in abx for pt. Elita Boone CMA  June 08, 2009 3:38 PM    ALLERGIES:   Waylan Rocher, LEVAQUIN  Follow-up for Phone Call        rx has been sent to the pharmacy and pt is aware Elita Boone CMA  June 08, 2009 4:28 PM     New/Updated Medications: VITAMIN D (ERGOCALCIFEROL) 50000 UNIT CAPS (ERGOCALCIFEROL) take one capsule by mouth once every week AUGMENTIN 875-125 MG TABS (AMOXICILLIN-POT CLAVULANATE) take one tablet by mouth two times a day until gone Prescriptions: AUGMENTIN 875-125 MG TABS (AMOXICILLIN-POT CLAVULANATE) take one tablet by mouth two times a day until gone  #14 x 0   Entered by:   Elita Boone CMA   Authorized by:   Noralee Space MD   Signed by:   Elita Boone CMA on 06/08/2009   Method used:   Electronically to        [facility] [phone]* (retail)       [place], [place]  [address]       Ph: [phone] or [phone]       Fax: [phone]   RxID:   [id number] VITAMIN D (ERGOCALCIFEROL) 50000 UNIT CAPS (ERGOCALCIFEROL) take one capsule by mouth once every week  #4 x 6   Entered by:   Elita Boone CMA   Authorized by:   Noralee Space MD   Signed by:   Elita Boone CMA on 06/08/2009   Method used:   Electronically to        [facility] [phone]* (retail)       [address]       [place],   [address]       Ph:  [phone] or [phone]       Fax: [phone]   RxID:   [id number]

## 2010-06-01 NOTE — Progress Notes (Signed)
Summary: Cardiology Phone Note - Back/CP/Nausea  Phone Note Call from Patient   Caller: Patient Summary of Call: Pt called to state since yesterday she has had back pain, left "side" pain, nausea, cough, and some chest pain. Took evening medicines last night without significant relief. Pt has h/o CAD 2002, HTN, DM. Still having pain this morning with no relief. Advised pt to proceed to ER and not to drive herself. She expressed understanding and will proceed to [facility]. Initial call taken by: Melina Copa PA-C

## 2010-06-01 NOTE — Letter (Signed)
Summary: [facility]  [facility]   Imported By: Rise Patience 01/12/2010 15:29:01  _____________________________________________________________________  External Attachment:    Type:   Image     Comment:   External Document

## 2010-06-01 NOTE — Progress Notes (Signed)
Summary: COUGH/ CONGESTION  Phone Note Call from Patient Call back at [phone]   Caller: Patient Call For: PARRETT Summary of Call: [place] Initial call taken by: Gustavus Bryant,  July 04, 2009 11:20 AM  Follow-up for Phone Call        Pt c/o cough for 2 months.   Sputum turned green again 1 week ago. Some sob.  Rattling in chest.  Denies fever.  Pt finished round of Augmentin and Pred taper 3 weeks ago.  Pt now taking Mucinex, ALbuterol Neb, Singulair, Symbicort and Hydroet cough syruo as needed.  Pt is requesting another abx.  Please advise. Doroteo Glassman RN  July 04, 2009 11:30 AM   Additional Follow-up for Phone Call Additional follow up Details #1::        per SN---ok for pt to have avelox 490m  #7  1 by mouth once daily until gone.  thanks LBrooklyn July 04, 2009 2:12 PM  rx sent. pt advised. JRandolph BingCMA  July 04, 2009 2:37 PM     New/Updated Medications: AVELOX 400 MG TABS (MOXIFLOXACIN HCL) Take 1 tablet by mouth once a day Prescriptions: AVELOX 400 MG TABS (MOXIFLOXACIN HCL) Take 1 tablet by mouth once a day  #7 x 0   Entered by:   JRandolph BingCMA   Authorized by:   SNoralee SpaceMD   Signed by:   JRandolph BingCMA on 07/04/2009   Method used:   [facility]ron[facility] to        [facility][phone] (retail)       [address]      [place]  [address]      Ph: [phone] [phone]      Fax: [phone]  RxID:   [id number]

## 2010-06-30 ENCOUNTER — Ambulatory Visit (INDEPENDENT_AMBULATORY_CARE_PROVIDER_SITE_OTHER): Payer: Medicare Other | Admitting: Pulmonary Disease

## 2010-06-30 ENCOUNTER — Other Ambulatory Visit: Payer: Medicare Other

## 2010-06-30 ENCOUNTER — Other Ambulatory Visit: Payer: Self-pay | Admitting: Pulmonary Disease

## 2010-06-30 ENCOUNTER — Encounter: Payer: Self-pay | Admitting: Pulmonary Disease

## 2010-06-30 DIAGNOSIS — R112 Nausea with vomiting, unspecified: Secondary | ICD-10-CM

## 2010-06-30 DIAGNOSIS — I509 Heart failure, unspecified: Secondary | ICD-10-CM

## 2010-06-30 DIAGNOSIS — R197 Diarrhea, unspecified: Secondary | ICD-10-CM

## 2010-06-30 DIAGNOSIS — I10 Essential (primary) hypertension: Secondary | ICD-10-CM

## 2010-06-30 DIAGNOSIS — E7800 Pure hypercholesterolemia, unspecified: Secondary | ICD-10-CM

## 2010-06-30 DIAGNOSIS — D649 Anemia, unspecified: Secondary | ICD-10-CM

## 2010-06-30 DIAGNOSIS — R0789 Other chest pain: Secondary | ICD-10-CM

## 2010-06-30 DIAGNOSIS — J209 Acute bronchitis, unspecified: Secondary | ICD-10-CM

## 2010-06-30 DIAGNOSIS — I2510 Atherosclerotic heart disease of native coronary artery without angina pectoris: Secondary | ICD-10-CM

## 2010-06-30 DIAGNOSIS — I5032 Chronic diastolic (congestive) heart failure: Secondary | ICD-10-CM

## 2010-06-30 DIAGNOSIS — J479 Bronchiectasis, uncomplicated: Secondary | ICD-10-CM

## 2010-06-30 DIAGNOSIS — E119 Type 2 diabetes mellitus without complications: Secondary | ICD-10-CM

## 2010-06-30 DIAGNOSIS — J45909 Unspecified asthma, uncomplicated: Secondary | ICD-10-CM

## 2010-06-30 LAB — LIPID PANEL
HDL: 57.4 mg/dL (ref >39.00)
LDL Cholesterol: 69 mg/dL (ref 0–99)
Total CHOL/HDL Ratio: 2
VLDL: 12.8 mg/dL (ref 0.0–40.0)

## 2010-06-30 LAB — BASIC METABOLIC PANEL
Calcium: 9.2 mg/dL (ref 8.4–10.5)
Creatinine, Ser: 0.4 mg/dL (ref 0.4–1.2)
GFR: 216.63 mL/min (ref >60.00)
Glucose, Bld: 121 mg/dL — ABNORMAL HIGH (ref 70–99)
Sodium: 141 mEq/L (ref 135–145)

## 2010-06-30 LAB — CBC WITH DIFFERENTIAL/PLATELET
Basophils Absolute: 0 10*3/uL (ref 0.0–0.1)
Basophils Relative: 0.4 % (ref 0.0–3.0)
HCT: 36.3 % (ref 36.0–46.0)
Hemoglobin: 12.3 g/dL (ref 12.0–15.0)
Lymphocytes Relative: 14.2 % (ref 12.0–46.0)
Lymphs Abs: 1.6 10*3/uL (ref 0.7–4.0)
Monocytes Relative: 5 % (ref 3.0–12.0)
Neutro Abs: 8.8 10*3/uL — ABNORMAL HIGH (ref 1.4–7.7)
RBC: 3.9 Mil/uL (ref 3.87–5.11)
RDW: 14.2 % (ref 11.5–14.6)

## 2010-06-30 LAB — TSH: TSH: 1.43 u[IU]/mL (ref 0.35–5.50)

## 2010-06-30 LAB — HEPATIC FUNCTION PANEL
Albumin: 4.2 g/dL (ref 3.5–5.2)
Alkaline Phosphatase: 59 U/L (ref 39–117)
Bilirubin, Direct: 0.1 mg/dL (ref 0.0–0.3)
Total Protein: 7.1 g/dL (ref 6.0–8.3)

## 2010-07-01 LAB — CONVERTED CEMR LAB: Vit D, 25-Hydroxy: 46 ng/mL (ref 30–89)

## 2010-07-03 ENCOUNTER — Telehealth: Payer: Self-pay | Admitting: Pulmonary Disease

## 2010-07-06 NOTE — Assessment & Plan Note (Signed)
Summary: 1 month follow up/mhh   Primary Care Provider:  Teressa Lower, MD  CC:  1 month follow up--fasting today--still having pain from arthritis---some increase in sob and cp but its time for her cardio appt.  History of Present Illness: 70 y/o BF here for a follow up visit... she has multiple medical problems as noted below...     ~  May 22, 2010:  she was seen 05/18/10 for a 42moROV & complained of left CWP that was evaluated in the ER & [facility] [place] "[place] ribs are out of whack due to the coughing" >> see prev EMR note for details... she was asked to watch for rash on her side as poss evolution of shingles diagnosis & she called today w/ c/o rash on her side> asked to come for recheck so we could document the distrib of the rash & start therapy...  However on inspection there is no rash present & when asked she said "Oh, isn't that a rash" pointing to her normal skin in her left side... no redness, no hyperesthesia, etc... she did note some itching & as time went on the itching seemed to be more or a problem...   ** We discussed Shingles & what it looks like> offered Atarax to take for her itching... continue other Rx.   ~  June 30, 2010:  she notes her wt is up 3# to 211# stating that she can't walk much due to her arthritis ("Xrays showed arthritis all over my body & I have to use a cane")> c/o pain but she says her left chest wall pain is improved [facility] Nabumetone & Parafon...    She sees DrStark for GI> on Omep, Asacol, Canasa, Analpram, Promethazine...    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    We follow for Resp problems> on Flonase, NEBS vs Ventolin, Singulair, Mucinex, Tussionex... and [facility] issues on Lipitor, Metformin, and Alprazolam for nerves...  Needs fasting labs today, on Lip10 now per her insurance...    Current Problem List:            ALLERGY (ICD-995.3) - on Saline, FLONASE 2spBid, SINGULAIR 127md... she does not wish to consider decreasing these meds...  ASTHMA (ICD-493.90) - prev on Advair but she stopped on her own... [facility], & NEBULIZER w/ Albut as needed (ave 3x per week) & PROAIR Prn when out & about... she is an ex-smoker having quit in 1975, no recent exacerbations.  BRONCHIECTASIS (ICD-494.0) - CT Chest 3/10 showed bilat LL bronchiectasis L>R...  PULMONARY NODULE (ICD-518.89) - 1.4cm nodule in LLL showed up on Urology CTAbd 07/12/08...  CT Chest 07/22/08 confirmed 1.4cm peripheral LLL nodule that wasn't present on CT in 2005 & no other lesions, +bronchiectasis in both LLs L>R, mod cardiomegaly...  PET Scan 4/10 showed no abn hypermetabolic activity- therefore likely scar or granuloma...  ~  CXR 2/11 showed cardiomeg, some COPD, NAD- LLL nodule seen on CT 3/10 is not vis on CXR.  ~  CXR 1/12 in ER showed cardiomeg, vasc congestion, no overt edema, no nodule seen.  HYPERTENSION (ICD-401.9) - controlled on ATENOLOL 5067m, NORVASC 22m77m CLONIDINE 0.1mgB5m LASIX 40mg-64mabs/d (2AM, 1PM), KCL 20Bid... BP=138/80 today, not checking BP at home... denies HAs, visual changes, palipit, dizziness, syncope, dyspnea, edema, etc...  CORONARY ARTERY DISEASE (ICD-414.00) - on PLAVIX 75mg/d35m  IMDUR 28m- 1/2 tab daily...  ~  she is s/p PTCA & stent to the LAD on 10/02...  ~  last cath 2/07 w/ patent LAD stent, non-obstructive dis otherwise, EF=65%...  ~  last NuclearStressTest 5/08 showed no ischemia, EF=64% (similiar to 2005)...  ~  hosp in Jun09 w/ CP- cath showing non-obstructive CAD w/ 25-40% lesions in all 3 vessels, prev stent in LAD patent, norm LVF..  ~  3/11:  f/u DrKatz & stable- no changes made, rec f/u 124yr CHEST PAIN, ATYPICAL (ICD-786.59) - she has chronic CWP, prob related to her fibromyalgia...  ~  1/12:  she went to ER, then [facility] w/ left CWP- cardiac evals neg, treated w/ rest,  heat, Vicodin, Tussionex, etc... [facility]  HYPERCHOLESTEROLEMIA (ICD-272.0) - on LIPITOR 1031m, + diet Rx..  ~  FLPBarton/08 showed TChol 132, TG 58, HDL 60, LDL 60  ~  FLP 7/09 showed TChol 116, TG 49, HDL 49, LDL 57  ~  FLP in hosp 3/10 showed TChol 130, TG 55, HDL 53, LDL 66  ~  FLP 2/11 on Cres5 showed TChol 104, TG 67, HDL 50, LDL 40  ~  Crestor 5mg77manged to Lipitor 10mg26m insurance co.  ~  FLP 3/12 showed TChol 139, TG 64, HDL 57, LDL 69  DIABETES MELLITUS (ICD-250.00) - on METFORMIN 500mgB66m+ low carb diet...  ~  last labs 11/08 showed BS=120, HgA1c=6.2  ~  labs 6/09 in hosp showed BS=110-120, HgA1c= 6.6... samMarland KitchenMarland Kitchenmeds, needs better diet!  ~  labs 3/10 in hosp showed BS= 113-155, HgA1c= 6.7  ~  labs 2/11 showed BS= 108, A1c= 7.1  ~  labs 1/12 in ER showed BS= 167...  ~  labs 3/12 showed BS= 121, A1c= 7.2... conMarland KitchenMarland Kitchennue same meds, better diet!  OBESITY (ICD-278.00) -   ~  weight 11/09 = 204#  ~  weight 3/10 = 208#  ~  weight 2/11 = 205#... we reviewed diet + exercise program.  ~  weight 1/12 = 208#  GERD (ICD-530.81) - on PRILOSEC 20mg- 15mng 1-2 daily but insurance won't pay- wants OMEPRAZOLE 40mg/d.75mVERTICULOSIS OF COLON (ICD-562.10) & COLONIC POLYPS (ICD-211.3) - last colonoscopy was 7/08 w/ divertics & colitis seen, + hyperplastic polyp removed...  Hx of ULCERATIVE COLITIS (ICD-556.9) - followed by DrStark for GI- on LIALDA 2.4gm/d, ROWASA Qhs, & off Hydrocort enemas...  ~  seen 10/11 by DrStark w/ freq loose stools- Asacol incr to 4tabs Tid & CANASA suppos for 68mo.  MI63moCOPIC HEMATURIA (ICD-599.72),  NEOPLASM, KIDNEY (ICD-239.5),  STRESS INCONTINENCE (ICD-788.39) - eval and Rx by DrWrenn 9/09 w/ stress incontinence, dysuria w/ microscopic hematuria-  Cysto w/ bladder bx was neg; and CT + Sonar w/ 1.8cm left upper pole renal lesion of ?etiology- poss tumor vs cyst (min enhancement, solid on sonar w/ min blood flow + 2 sm cysts also seen)...  they  are following a watchful waiting protocol & repeat CT 3/10 showed stable 1.7cm category II cyst upper pole of left kidney without change... f/u planned 39yr.  DEG2yrATIVE JOINT DISEASE (ICD-715.90) - she is followed by DrVoytek... RELAFEN 750mg Bid P52m PARAFON FORTE Tid Prn...  ~  she was seen 12/11 w/ bilat foot pain & XRay showed arthritic changes- given DepoMedrol+Toradol shot IM.  FIBROMYALGIA (ICD-729.1)  VITAMIN D DEFICIENCY (ICD-268.9) - Vit D level 2/11= 11 and pt rec to take Vit D 50000 u weekly but ?never filled this Rx... therefore rec 8/11 to  switch to OTC Vit D 5000 u daily from the health food store...   ANXIETY (ICD-300.00) - she has lots of stress caring for Momma... on ALPRAZOLAM 0.48m Tid Prn...  Hx of ANEMIA (ICD-285.9) - on NUIRON 159md...   ~  labs 11/08 showed Hg=12.2, Fe=38/ TIBC=287/ sat=9%...  ~  labs 3/10 in hosp showed Hg= 13.0, MCV= 92...  ~  labs 2/11 showed Hg= 12.7, Fe= 67...  ~  labs 1/12 in ER showed Hg= 11.7   Preventive Screening-Counseling & Management  Alcohol-Tobacco     Smoking Status: quit     Packs/Day: 2ppd     Year Started: 1960     Year Quit: 1955Caffeine-Diet-Exercise     Does Patient Exercise: yes  Allergies: 1)  ! Codeine 2)  ! Cipro 3)  ! Zithromax (Azithromycin) 4)  ! Tramadol Hcl (Tramadol Hcl) 5)  ! Levaquin (Levofloxacin) 6)  ! Asa 7)  ! Tylox  Comments:  Nurse/Medical Assistant: [name] medications and allergies were reviewed with the patient and were updated in the Medication and Allergy Lists.  Past History:  Past Medical History: ALLERGY (ICD-995.3) ASTHMA (ICD-493.90) BRONCHIECTASIS (ICD-494.0) PULMONARY NODULE (ICD-518.89) HYPERTENSION (ICD-401.9) CORONARY ARTERY DISEASE (ICD-414.00) CHEST PAIN, ATYPICAL (ICAFB-903.83DIASTOLIC HEART FAILURE, CHRONIC (ICD-428.32) HYPERCHOLESTEROLEMIA (ICD-272.0) DIABETES MELLITUS (ICD-250.00) OBESITY (ICD-278.00) GERD (ICD-530.81) DIVERTICULOSIS OF COLON  (ICD-562.10) Hx of LEFT SIDED ULCERATIVE COLITIS (ICD-556.9), 2008 ADENOMATOUS COLONIC POLYPS (ICD-211.3) 02/2006 MICROSCOPIC HEMATURIA (ICD-599.72) NEOPLASM, KIDNEY (ICD-239.5) STRESS INCONTINENCE (ICD-788.39) DEGENERATIVE JOINT DISEASE (ICD-715.90) FIBROMYALGIA (ICD-729.1) VITAMIN D DEFICIENCY (ICD-268.9) ANXIETY (ICD-300.00) Hx of ANEMIA (ICD-285.9)  Past Surgical History: S/P hysterectomy S/P cholecystectomy S/P right hand surgery 10/07 by Dr. GrAmedeo PlentyTCA-Stent  Family History: Reviewed history from 07/26/2008 and no changes required. Father died age 68/ ASHD/ MI, pneumonia, arthritis Mother alive age 68/ severe parkinson's disease 2 Siblings- Bro died w/ sarcoid, DM Sis, GlTami Linage 6152as HBP, Chol, DM No FH of Colon Cancer, but father had colon polyps  Social History: Reviewed history from 11/29/2009 and no changes required. Divorced ex-smoker: smoked for 10-1540yrp to 2ppd, quit 1975 social alcohol not exercising retired  Review of Systems      See HPI       The patient complains of weight gain, decreased hearing, chest pain, dyspnea on exertion, muscle weakness, and difficulty walking.  The patient denies anorexia, fever, weight loss, vision loss, hoarseness, syncope, peripheral edema, prolonged cough, headaches, hemoptysis, abdominal pain, melena, hematochezia, severe indigestion/heartburn, hematuria, incontinence, suspicious skin lesions, transient blindness, depression, unusual weight change, abnormal bleeding, enlarged lymph nodes, and angioedema.    Vital Signs:  Patient profile:   [age]ar old female Height:      62 inches Weight:      211.25 pounds BMI:     38.78 O2 Sat:      98 % on Room air Temp:     98.3 degrees F oral Pulse rate:   82 / minute BP sitting:   138 / 80  (left arm) Cuff size:   regular  Vitals Entered By: LesTilden Domearch  2, 2012 9:05 AM)  O2 Sat at Rest %:  98 O2 Flow:  Room air CC: 1 month follow up--fasting  today--still having pain from arthritis---some increase in sob and cp but its time for her cardio appt Is Patient Diabetic? Yes Pain Assessment Patient in pain? yes      Onset of pain  arthritis pain  Comments no changes in meds today  Physical Exam  Additional Exam:  WD, Obese, 70 y/o BF in NAD... c/o pain in her left lat chest wall> no rash present. GENERAL:  Alert & oriented; pleasant & cooperative... HEENT:  [place]/AT, EOM-wnl, PERRLA, EACs-clear, TMs-wnl, NOSE-clear, THROAT-clear & wnl. NECK:  Supple w/ fairROM; no JVD; normal carotid impulses w/o bruits; no thyromegaly or nodules palpated; no lymphadenopathy. CHEST:  Clear to P & A; without wheezes/ rales/ or rhonchi... +trigger points along trapezius area and chest wall. HEART:  Regular Rhythm; without murmurs/ rubs/ or gallops heard... +tender to palp. ABDOMEN:  Obese, soft & nontender; normal bowel sounds; no organomegaly or masses detected. EXT: without deformities, mild arthritic changes; no varicose veins/ +venous insuffic/ tr edema. NEURO:  CN's intact;  no focal neuro deficits... DERM:  no shingles rash found...    Impression & Recommendations:  Problem # 1:  PULM >>> She has hx Asthma, underlying bronchiectasis, & 1.4cm granuloma LLL on prev XRay & scans... breathing stable overall & she is encouraged to continue her NEBS & Mucinex... also uses the Singulair 7 flonase for allergic rhinitis problems...  Problem # 2:  HYPERTENSION (ICD-401.9) BP is stable>  continue same meds... Her updated medication list for this problem includes:    Atenolol 50 Mg Tabs (Atenolol) .Marland Kitchen... Take 1 tablet by mouth once a day    Norvasc 10 Mg Tabs (Amlodipine besylate) .Marland Kitchen... Take 1 tablet by mouth once a day    Clonidine Hcl 0.1 Mg Tabs (Clonidine hcl) .Marland Kitchen... 1 tab by mouth two times a day...    Lasix 40 Mg Tabs (Furosemide) .Marland Kitchen... Take 2 tablets in the am, and take 1 tablet by mouth in the pm  Orders: TLB-BMP (Basic Metabolic Panel-BMET)  (82641-RAXENMM) TLB-Hepatic/Liver Function Pnl (80076-HEPATIC) TLB-CBC Platelet - w/Differential (85025-CBCD) TLB-Lipid Panel (80061-LIPID) TLB-TSH (Thyroid Stimulating Hormone) (84443-TSH) TLB-BNP (B-Natriuretic Peptide) (83880-BNPR) TLB-A1C / Hgb A1C (Glycohemoglobin) (83036-A1C) T-Vitamin D (25-Hydroxy) (76808-81103)  Problem # 3:  CORONARY ARTERY DISEASE (ICD-414.00) She is due for f/u w/ Cards & she will ask if there are any meds she can discontinue... Her updated medication list for this problem includes:    Plavix 75 Mg Tabs (Clopidogrel bisulfate) .Marland Kitchen... Take 1 tablet by mouth once a day    Nitrostat 0.4 Mg Subl (Nitroglycerin) .Marland Kitchen... 1 tablet under tongue at onset of chest pain; you may repeat every 5 minutes for up to 3 doses.    Isosorbide Mononitrate Cr 60 Mg Xr24h-tab (Isosorbide mononitrate) .Marland Kitchen... Take 1/2 tablet by mouth daily    Atenolol 50 Mg Tabs (Atenolol) .Marland Kitchen... Take 1 tablet by mouth once a day    Norvasc 10 Mg Tabs (Amlodipine besylate) .Marland Kitchen... Take 1 tablet by mouth once a day    Clonidine Hcl 0.1 Mg Tabs (Clonidine hcl) .Marland Kitchen... 1 tab by mouth two times a day...    Lasix 40 Mg Tabs (Furosemide) .Marland Kitchen... Take 2 tablets in the am, and take 1 tablet by mouth in the pm  Problem # 4:  HYPERCHOLESTEROLEMIA (ICD-272.0) On Lip10 now per her insurance requirements & tol satis> FLP looks good & she will continue same. Her updated medication list for this problem includes:    Lipitor 10 Mg Tabs (Atorvastatin calcium) .Marland Kitchen... Take one tablet by mouth daily.  Problem # 5:  DIABETES MELLITUS (ICD-250.00) She needs a better low carb diet & exercise program> the goal is to lose 10-15 lbs... BS & A1c are OK on the Metformin monotherapy... Her updated medication list for this problem includes:  Metformin Hcl 500 Mg Tabs (Metformin hcl) .Marland Kitchen... 1 tab by mouth two times a day...  Problem # 6:  OBESITY (ICD-278.00) Weight reduction is critical for her...  Problem # 7:  Hx of ULCERATIVE COLITIS  (ICD-556.9) Followed by DrStark 7 she is due for a follow up & review of her meds...  Problem # 8:  MICROSCOPIC HEMATURIA (ICD-599.72) She continues to follow w/ drWrenn for Urology...  Problem # 9:  DEGENERATIVE JOINT DISEASE (ICD-715.90) She is very pleased w/ the response to rx from [facility]... Her updated medication list for this problem includes:    Nabumetone 750 Mg Tabs (Nabumetone) .Marland Kitchen... Take 1 tab by mouth two times a day w/ food...    Hydrocodone-acetaminophen 5-500 Mg Tabs (Hydrocodone-acetaminophen) .Marland Kitchen... Take 1 tab by mouth every 6h as needed for pain...  Problem # 10:  ANXIETY (ICD-300.00) She will continue on the alprazolam for nerves... Her updated medication list for this problem includes:    Alprazolam 0.5 Mg Tabs (Alprazolam) .Marland Kitchen... Take 1 tablet by mouth three times a day not to exceed 3 per day    Hydroxyzine Hcl 25 Mg Tabs (Hydroxyzine hcl) .Marland Kitchen... Take 1 tab by mouth every 4h as needed for itching...  Complete Medication List: 1)  Flonase 50 Mcg/act Susp (Fluticasone propionate) .... 2 sprays in each nostril two times a day... 2)  Singulair 10 Mg Tabs (Montelukast sodium) .... Take 1 tablet by mouth once a day 3)  Albuterol Sulfate (2.5 Mg/51m) 0.083% Nebu (Albuterol sulfate) .... Use in nebulizer up to three times per day as needed for wheezing... 4)  Ventolin Hfa 108 (90 Base) Mcg/act Aers (Albuterol sulfate) ..Marland Kitchen. 1-2 sprays every 6 hours as needed for wheezing 5)  Mucinex 600 Mg Xr12h-tab (Guaifenesin) .... Take 2 tab by mouth two times a day w/ plenty of fluids..Marland KitchenMarland Kitchen6)  Plavix 75 Mg Tabs (Clopidogrel bisulfate) .... Take 1 tablet by mouth once a day 7)  Nitrostat 0.4 Mg Subl (Nitroglycerin) ..Marland Kitchen. 1 tablet under tongue at onset of chest pain; you may repeat every 5 minutes for up to 3 doses. 8)  Isosorbide Mononitrate Cr 60 Mg Xr24h-tab (Isosorbide mononitrate) .... Take 1/2 tablet by mouth daily 9)  Atenolol 50 Mg Tabs (Atenolol) .... Take 1 tablet by mouth once  a day 10)  Norvasc 10 Mg Tabs (Amlodipine besylate) .... Take 1 tablet by mouth once a day 11)  Clonidine Hcl 0.1 Mg Tabs (Clonidine hcl) ..Marland Kitchen. 1 tab by mouth two times a day... 12)  Lasix 40 Mg Tabs (Furosemide) .... Take 2 tablets in the am, and take 1 tablet by mouth in the pm 13)  Potassium Chloride Crys Cr 20 Meq Tbcr (Potassium chloride crys cr) .... Take 1 tab by mouth two times a day... 14)  Lipitor 10 Mg Tabs (Atorvastatin calcium) .... Take one tablet by mouth daily. 15)  Metformin Hcl 500 Mg Tabs (Metformin hcl) ..Marland Kitchen. 1 tab by mouth two times a day... 16)  Omeprazole 40 Mg Cpdr (Omeprazole) .... Take 1 tab by mouth once daily- 30 min before the 1st meal of the day... 17)  Asacol 400 Mg Tbec (Mesalamine) .... Take 4 tabs by mouth three times a day 18)  Canasa 1000 Mg Supp (Mesalamine) .... One suppository into rectum at bedtime 19)  Analpram-hc 1-2.5 % Crea (Hydrocortisone ace-pramoxine) .... Apply a small amount to affected area  twice a day 20)  Promethazine Hcl 25 Mg Tabs (Promethazine hcl) ..Marland Kitchen. 1 by mouth every 4hrs as needed  for nausea 21)  Nabumetone 750 Mg Tabs (Nabumetone) .... Take 1 tab by mouth two times a day w/ food... 22)  Parafon Forte Dsc 500 Mg Tabs (Chlorzoxazone) .Marland Kitchen.. 1 tab by mouth 4 times per day as needed for muscle spasm... 23)  Alprazolam 0.5 Mg Tabs (Alprazolam) .... Take 1 tablet by mouth three times a day not to exceed 3 per day 24)  Vitamin D3 5000 Unit Caps (Cholecalciferol) .... Take one capsule by mouth once daily... 25)  Hydrocodone-acetaminophen 5-500 Mg Tabs (Hydrocodone-acetaminophen) .... Take 1 tab by mouth every 6h as needed for pain... 26)  Tussionex Pennkinetic Er 10-8 Mg/65m Lqcr (Hydrocod polst-chlorphen polst) .... Take 1 tsp by mouth every 12h as needed for cough... 27)  Hydroxyzine Hcl 25 Mg Tabs (Hydroxyzine hcl) .... Take 1 tab by mouth every 4h as needed for itching...  Patient Instructions: 1)  Today we updated your med list- see  below.... 2)  Continue your current meds the same including the NEBS 3 times daily, Singulair daily, MUCINEX 2 tabs twice daily every day!, etc...Marland Kitchen3)  Today we did your follow up FASTING blood work... please call the "phone tree" in a few days for your lab results..Marland KitchenMarland Kitchen4)  Stay as active as poss & work on weight reduction!!! 5)  Call for any questions..Marland KitchenMarland Kitchen6)  Please schedule a follow-up appointment in 6 months, sooner as needed..Marland KitchenMarland Kitchen

## 2010-07-11 NOTE — Progress Notes (Signed)
Summary: Alprazolam Refill request  Phone Note Refill Request Message from:  Fax from Pharmacy on July 03, 2010 12:15 PM  Refills Requested: Medication #1:  ALPRAZOLAM 0.5 MG  TABS take 1 tablet by mouth three times a day not to exceed 3 per day   Dosage confirmed as above?Dosage Confirmed   Brand Name Necessary? No   Supply Requested: 1 month   Last Refilled: 04/03/2010   Notes: [name] [address]  [place]) [phone],  [phone]   Method Requested: Telephone to Pharmacy Next Appointment Scheduled: 01/02/2011 w/ SN Initial call taken by: Francesca Jewett CMA,  July 03, 2010 12:16 PM  Follow-up for Phone Call        Pt just had OV on 06/30/2010 w/ SN. Pls advise if okay to send refills.Francesca Jewett CMA  July 03, 2010 12:17 PM    Prescriptions: ALPRAZOLAM 0.5 MG  TABS (ALPRAZOLAM) take 1 tablet by mouth three times a day not to exceed 3 per day  #90 x 5   Entered by:   Elita Boone CMA   Authorized by:   Noralee Space MD   Signed by:   Elita Boone CMA on 07/03/2010   Method used:   Telephoned to ...       [facility]  [address] [phone]* (retail)       [address]       [place], [place]  [address]       Ph: [phone] or [phone]       Fax: [phone]   RxID:   [id number]

## 2010-07-24 ENCOUNTER — Other Ambulatory Visit: Payer: Self-pay | Admitting: Cardiology

## 2010-07-27 NOTE — Telephone Encounter (Signed)
[address]

## 2010-07-28 ENCOUNTER — Other Ambulatory Visit: Payer: Self-pay

## 2010-07-28 MED ORDER — ATORVASTATIN CALCIUM 10 MG PO TABS: 10.0000 mg | ORAL_TABLET | Freq: Every day | ORAL | Status: DC

## 2010-08-01 ENCOUNTER — Other Ambulatory Visit: Payer: Self-pay | Admitting: Cardiology

## 2010-08-01 ENCOUNTER — Other Ambulatory Visit: Payer: Self-pay | Admitting: Pulmonary Disease

## 2010-08-04 LAB — POCT I-STAT, CHEM 8
BUN: 12 mg/dL (ref 6–23)
Calcium, Ion: 1.05 mmol/L — ABNORMAL LOW (ref 1.12–1.32)
Chloride: 106 mEq/L (ref 96–112)
Creatinine, Ser: <0.2 mg/dL — ABNORMAL LOW (ref 0.4–1.2)
TCO2: 24 mmol/L (ref 0–100)

## 2010-08-04 LAB — POCT CARDIAC MARKERS
CKMB, poc: <1 ng/mL — ABNORMAL LOW (ref 1.0–8.0)
Troponin i, poc: <0.05 ng/mL (ref 0.00–0.09)

## 2010-08-04 LAB — GLUCOSE, CAPILLARY
Glucose-Capillary: 111 mg/dL — ABNORMAL HIGH (ref 70–99)
Glucose-Capillary: 119 mg/dL — ABNORMAL HIGH (ref 70–99)

## 2010-08-09 LAB — GLUCOSE, CAPILLARY: Glucose-Capillary: 115 mg/dL — ABNORMAL HIGH (ref 70–99)

## 2010-08-10 LAB — CARDIAC PANEL(CRET KIN+CKTOT+MB+TROPI)
Relative Index: INVALID (ref 0.0–2.5)
Total CK: 71 U/L (ref 7–177)
Troponin I: <0.01 ng/mL (ref 0.00–0.06)

## 2010-08-10 LAB — DIFFERENTIAL
Basophils Absolute: 0 10*3/uL (ref 0.0–0.1)
Eosinophils Absolute: 0.2 10*3/uL (ref 0.0–0.7)
Lymphocytes Relative: 16 % (ref 12–46)
Lymphs Abs: 1.2 10*3/uL (ref 0.7–4.0)
Neutrophils Relative %: 75 % (ref 43–77)

## 2010-08-10 LAB — LIPID PANEL
Cholesterol: 130 mg/dL (ref 0–200)
LDL Cholesterol: 66 mg/dL (ref 0–99)
Triglycerides: 55 mg/dL (ref <150)
VLDL: 11 mg/dL (ref 0–40)

## 2010-08-10 LAB — CK TOTAL AND CKMB (NOT AT ARMC)
CK, MB: 1.3 ng/mL (ref 0.3–4.0)
Total CK: 86 U/L (ref 7–177)

## 2010-08-10 LAB — BASIC METABOLIC PANEL
BUN: 8 mg/dL (ref 6–23)
Creatinine, Ser: 0.39 mg/dL — ABNORMAL LOW (ref 0.4–1.2)
GFR calc non Af Amer: >60 mL/min (ref >60)

## 2010-08-10 LAB — CBC
Platelets: 440 10*3/uL — ABNORMAL HIGH (ref 150–400)
WBC: 7.5 10*3/uL (ref 4.0–10.5)

## 2010-08-10 LAB — TSH: TSH: 0.882 u[IU]/mL (ref 0.350–4.500)

## 2010-08-10 LAB — BRAIN NATRIURETIC PEPTIDE: Pro B Natriuretic peptide (BNP): 38 pg/mL (ref 0.0–100.0)

## 2010-08-10 LAB — TROPONIN I: Troponin I: <0.01 ng/mL (ref 0.00–0.06)

## 2010-08-10 LAB — GLUCOSE, CAPILLARY
Glucose-Capillary: 148 mg/dL — ABNORMAL HIGH (ref 70–99)
Glucose-Capillary: 155 mg/dL — ABNORMAL HIGH (ref 70–99)

## 2010-08-10 LAB — PROTIME-INR: INR: 1 (ref 0.00–1.49)

## 2010-08-18 ENCOUNTER — Encounter: Payer: Self-pay | Admitting: Cardiology

## 2010-08-18 DIAGNOSIS — I10 Essential (primary) hypertension: Secondary | ICD-10-CM | POA: Insufficient documentation

## 2010-08-19 ENCOUNTER — Encounter: Payer: Self-pay | Admitting: Cardiology

## 2010-08-21 ENCOUNTER — Encounter: Payer: Self-pay | Admitting: Cardiology

## 2010-08-21 ENCOUNTER — Ambulatory Visit (INDEPENDENT_AMBULATORY_CARE_PROVIDER_SITE_OTHER): Payer: Medicare Other | Admitting: Cardiology

## 2010-08-21 DIAGNOSIS — I10 Essential (primary) hypertension: Secondary | ICD-10-CM

## 2010-08-21 DIAGNOSIS — I25119 Atherosclerotic heart disease of native coronary artery with unspecified angina pectoris: Secondary | ICD-10-CM | POA: Insufficient documentation

## 2010-08-21 DIAGNOSIS — I2510 Atherosclerotic heart disease of native coronary artery without angina pectoris: Secondary | ICD-10-CM | POA: Insufficient documentation

## 2010-08-21 DIAGNOSIS — J479 Bronchiectasis, uncomplicated: Secondary | ICD-10-CM | POA: Insufficient documentation

## 2010-08-21 DIAGNOSIS — I509 Heart failure, unspecified: Secondary | ICD-10-CM

## 2010-08-21 NOTE — Assessment & Plan Note (Signed)
Blood pressure is controlled. No change in therapy. 

## 2010-08-21 NOTE — Patient Instructions (Signed)
Your physician recommends that you schedule a follow-up appointment in: 6 months with Dr. Ron Parker

## 2010-08-21 NOTE — Progress Notes (Signed)
HPI Patient is seen today to followup coronary artery disease.  She is actually doing well.  She is not having chest pain or shortness of breath.  She is gaining weight.  She is trying to follow a diet very carefully.  She has some arthritis in her ankles and is not able to walk well. Allergies  Allergen Reactions  . Aspirin   . Azithromycin     REACTION: pt states "ZPak doesn't work"  . Ciprofloxacin   . Codeine   . Levofloxacin     REACTION: chest pain  . Oxycodone-Acetaminophen     REACTION: vomiting  . Tramadol Hcl     REACTION: pt states "spaced-out"    Current Outpatient Prescriptions  Medication Sig Dispense Refill  . ALPRAZolam (XANAX) 0.5 MG tablet Take 0.5 mg by mouth 3 (three) times daily. NOT TO EXCEED 3 PER DAY       . amLODipine (NORVASC) 10 MG tablet Take 10 mg by mouth daily.        Marland Kitchen atenolol (TENORMIN) 50 MG tablet Take 50 mg by mouth daily.        Marland Kitchen atorvastatin (LIPITOR) 10 MG tablet Take 1 tablet (10 mg total) by mouth daily.  30 tablet  11  . chlorpheniramine-HYDROcodone (TUSSIONEX PENNKINETIC ER) 10-8 MG/5ML LQCR Take 5 mLs by mouth every 12 (twelve) hours as needed.       . chlorzoxazone (PARAFON) 500 MG tablet Take 500 mg by mouth 4 (four) times daily as needed.        . Cholecalciferol (VITAMIN D-3) 5000 UNITS TABS Take 5,000 Units by mouth daily.        . cloNIDine (CATAPRES) 0.1 MG tablet Take 0.1 mg by mouth 2 (two) times daily.        . fluticasone (FLONASE) 50 MCG/ACT nasal spray 2 sprays by Nasal route 2 (two) times daily.        . furosemide (LASIX) 40 MG tablet Take by mouth. TAKE 2 TABLETS IN THE AM,AND TAKE 1 TABLET BY MOUTH IN THE PM.       . guaiFENesin (MUCINEX) 600 MG 12 hr tablet Take 1,200 mg by mouth 2 (two) times daily.        . hydrocodone-acetaminophen (LORCET-HD) 5-500 MG per capsule Take 1 capsule by mouth every 6 (six) hours as needed.        . hydrocortisone-pramoxine (ANALPRAM-HC) 2.5-1 % rectal cream Place rectally 2 (two) times  daily. APPLY A SMALL AMOUNT TO AFFECTED AREA.       . hydrOXYzine (ATARAX) 25 MG tablet Take 25 mg by mouth every 4 (four) hours as needed.        . isosorbide mononitrate (IMDUR) 60 MG 24 hr tablet Take 30 mg by mouth daily.        Marland Kitchen LIPITOR 10 MG tablet TAKE ONE TABLET BY MOUTH DAILY.  30 tablet  11  . mesalamine (ASACOL) 400 MG EC tablet Take 1,600 mg by mouth 3 (three) times daily.        . mesalamine (CANASA) 1000 MG suppository Place 1,000 mg rectally at bedtime.        . metFORMIN (GLUCOPHAGE) 500 MG tablet TAKE 1 TABLET BY MOUTH TWICE A DAY  60 tablet  5  . montelukast (SINGULAIR) 10 MG tablet Take 10 mg by mouth at bedtime.        . nabumetone (RELAFEN) 750 MG tablet Take 750 mg by mouth 2 (two) times daily.        Marland Kitchen  nitroGLYCERIN (NITROSTAT) 0.4 MG SL tablet Place 0.4 mg under the tongue every 5 (five) minutes as needed.        Marland Kitchen omeprazole (PRILOSEC) 40 MG capsule Take 40 mg by mouth daily.        Marland Kitchen PLAVIX 75 MG tablet TAKE 1 TABLET BY MOUTH ONCE A DAY  30 tablet  3  . potassium chloride SA (K-DUR,KLOR-CON) 20 MEQ tablet Take 20 mEq by mouth 2 (two) times daily.        . promethazine (PHENERGAN) 25 MG tablet Take 25 mg by mouth every 4 (four) hours as needed.        Marland Kitchen albuterol (PROVENTIL) (2.5 MG/3ML) 0.083% nebulizer solution Take 2.5 mg by nebulization as needed. USE IN NEBULIZER UP TO THREE TIME PER DAY AS NEEDED FOR WHEEZING.       Marland Kitchen albuterol (VENTOLIN HFA) 108 (90 BASE) MCG/ACT inhaler Inhale 2 puffs into the lungs as directed.          History   Social History  . Marital Status: Divorced    Spouse Name: N/A    Number of Children: N/A  . Years of Education: N/A   Occupational History  . Retired    Social History Main Topics  . Smoking status: Former Research scientist (life sciences)  . Smokeless tobacco: Not on file   Comment: ex-smoker: smoked for 10-15 years up to 2ppd, quit 1975.  Marland Kitchen Alcohol Use: Yes     Social drinker  . Drug Use: Not on file  . Sexually Active: Not on file   Other  Topics Concern  . Not on file   Social History Narrative  . No narrative on file    Family History  Problem Relation Age of Onset  . Pneumonia Father   . Heart attack Father   . Other Mother     parkinsons disease  . Sarcoidosis Brother   . Hypertension Sister     Past Medical History  Diagnosis Date  . CHF (congestive heart failure)     Diastolic  . Volume overload   . Bronchiectasis   . Hypertension   . CAD (coronary artery disease)     Stent to the LAD, 2002 / catheterization 2009 LAD stents patent, other mild disease  . Dyslipidemia   . Diabetes mellitus   . Overweight   . GERD (gastroesophageal reflux disease)   . Fibromyalgia   . Anxiety   . Allergy, unspecified not elsewhere classified   . Unspecified asthma   . Other diseases of lung, not elsewhere classified   . Other chest pain   . Hypercholesterolemia   . Diverticulosis of colon   . Ulcerative colitis, left sided 2008    Hx of  . Adenomatous colon polyp 02/2006  . Microscopic hematuria   . Neoplasm of kidney   . Stress incontinence, female   . DJD (degenerative joint disease)   . Vitamin D deficiency disease   . Anemia   . H/O: hysterectomy     Past Surgical History  Procedure Date  . Cholecystectomy   . Hand surgery 01/2006    Right Hand Surgery by Dr. Amedeo Plenty  . Ptca     Stent    ROS  Patient denies fever, chills, headache, sweats, rash, change in vision, change in hearing, chest pain, cough, nausea vomiting, urinary symptoms.  All other systems are reviewed and are negative.  PHYSICAL EXAM Patient has gained weight.  She is stable.  She is oriented to person time and place.  Affect is normal.  Head is atraumatic.  There is no xanthelasma.  Lungs are clear.  Respiratory effort is nonlabored.  Cardiac exam reveals S1 and S2.  No clicks or significant murmurs.  The abdomen is obese but soft.  There is no peripheral edema.  There are no musculoskeletal deformities. Filed Vitals:   08/21/10  0955  BP: 130/80  Pulse: 68  Resp: 18  Height: _0  (1.575 m)  Weight: 214 lb 6.4 oz (97.251 kg)    EKG  EKG is done today.  It is reviewed by me.  There are no significant changes.  ASSESSMENT & PLAN

## 2010-08-21 NOTE — Assessment & Plan Note (Signed)
Fluid status is stable.  No change in therapy.

## 2010-08-21 NOTE — Assessment & Plan Note (Signed)
Coronary disease is stable.  She does not need any further workup.

## 2010-08-27 ENCOUNTER — Other Ambulatory Visit: Payer: Self-pay | Admitting: Cardiology

## 2010-08-31 ENCOUNTER — Other Ambulatory Visit: Payer: Self-pay | Admitting: Cardiology

## 2010-09-05 ENCOUNTER — Other Ambulatory Visit: Payer: Self-pay | Admitting: Adult Health

## 2010-09-05 ENCOUNTER — Other Ambulatory Visit: Payer: Self-pay | Admitting: Pulmonary Disease

## 2010-09-12 NOTE — Assessment & Plan Note (Signed)
[place]                         GASTROENTEROLOGY OFFICE NOTE   NAME:[name] [name] DEVINCENZI                      MRN:          [id number]  DATE:01/06/2007                            DOB:          1941-02-25    This is a return office visit for newly diagnosed left-sided ulcerative  colitis and hemorrhoids.  Her chronic diarrhea and rectal bleeding have  substantially improved since beginning Lialda and Rowasa enemas.  Her  hemorrhoids were also injected.  Her reflux symptoms are under good  control on Protonix b.i.d.   MEDICATIONS:  Listed on the chart, updated and reviewed.   ALLERGIES:  CODEINE, CIPRO.   PHYSICAL EXAMINATION:  GENERAL:  No acute distress.  VITAL SIGNS:  Weight 193 pounds.  Blood pressure 120/80, pulse 72 and  regular.  CHEST:  Clear to auscultation bilaterally.  HEART:  Regular rate and rhythm without murmurs.  ABDOMEN:  Soft and nontender with normal active bowel sounds.   ASSESSMENT:  1. Left-sided ulcerative colitis.  Continue with Lialda at 2.4 grams      daily.  Change to Canasa 1000 mg suppositories for one month and      then she may use as needed.  Continue Questran daily p.r.n.      diarrhea.  Return office visit in 6 months.  2. Chronic gastroesophageal reflux disease.  Continue standard      antireflux measures and Pantoprazole 40 mg p.o. b.i.d.     [name] T. Fuller Plan, MD, [facility]  Electronically Signed    MTS/MedQ  DD: 01/06/2007  DT: 01/06/2007  Job #: [id number]   cc:   [name] [name]. [name] Gilford, MD

## 2010-09-12 NOTE — Discharge Summary (Signed)
[name], [name] NO.:  [id number]   MEDICAL RECORD NO.:  [id number]          PATIENT TYPE:  INP   LOCATION:  2951                         FACILITY:  [facility]   PHYSICIAN:  Carlena Bjornstad, MD, FACCDATE OF BIRTH:  Dec 03, 1940   DATE OF ADMISSION:  09/03/2006  DATE OF DISCHARGE:  09/04/2006                               DISCHARGE SUMMARY   PRIMARY CARDIOLOGIST:  Dr. Dola Argyle.   PRIMARY CARE Talbert Trembath:  Dr. Lenna Gilford.   PRINCIPAL DIAGNOSIS:  Chest pain.   SECONDARY DIAGNOSES:  1. Coronary artery disease.  2. Hypertension.  3. Hyperlipidemia.  4. Type 2 diabetes mellitus.  5. Irritable bowel syndrome.  6. Chronic chest pain.  7. Hemorrhoids.  8. Fibromyalgia.  9. Gastroesophageal reflux disease.  10.Asthmatic bronchitis.  11.Degenerative joint disease.  12.Status post hysterectomy.  13.Anxiety.  14.History of thyroid nodule.  15.Status post cholecystectomy.  16.Status post bilateral tubal ligation.  88.CZYSAYT of diastolic heart failure.   ALLERGIES:  CODEINE, CIPRO, ASPIRIN, AND CAFFEINE.   PROCEDURES:  None.   HISTORY OF PRESENT ILLNESS:  A 70 year old African-American female with  prior history of CAD as well as chronic chest pain who was in her usual  state of health until approximately 7:30 a.m. on Sep 03, 2006, when she  noted left-sided sharp chest discomfort with mild associated numbness in  her left neck and arm without dyspnea, nausea, or diaphoresis.  The  patient was persistent throughout the day prompting her to present to  the [facility] ED.  There, EKG showed no acute changes and first set of  markers were negative.  She was admitted for evaluation.   HOSPITAL COURSE:  [name] has ruled out for a MI and reports no  chest discomfort this morning.  She was hypokalemic with a calcium of  3.3 which has been replaced prior to discharge.  We have arranged for  her to have an outpatient adenosine Myoview on Sep 13, 2006 at 9:30 a.m.  and subsequent follow up with Dr. Ron Parker on Sep 18, 2006.  She will be  discharged home today in satisfactory condition.   DISCHARGE LABS:  Hemoglobin 11.6, hematocrit 34.1, WBC 8.8, platelets  469,000.  Sodium 139, potassium 3.3 (replaced prior to discharge),  chloride 102, CO2 27, BUN 8, creatinine 0.44, glucose 133.  PT 13.6, INR  1, PTT 30.  Total bilirubin 0.5.  Alkaline-phosphatase 51.  AST 18, ALT  9.  Albumin 3.5.  CK 83.  MB 1.5.  Troponin I 0.01.  Calcium 9.1.  BMP  less than 30.  TSH 1.425.   DISPOSITION:  The patient is being discharged home today in good  condition.   FOLLOW UP PLANS AND APPOINTMENTS:  She has an adenosine Myoview  scheduled on Sep 13, 2006 at 9:30 a.m.  She will then follow up with Dr.  Kae Heller nurse practitioner and P.A. on Sep 18, 2006 at 9:45 a.m.  She  knows to follow up with Dr. Lenna Gilford with regards to chronic anemia with a  hemoglobin of 11.6 during this admission.   DISCHARGE MEDICATIONS:  1. Flonase 0.5%  once per each nostril every day.  2. Singulair 10 mg every day.  3. Guaifenex DM b.i.d.  4. Allegra 60 mg b.i.d.  5. Advair 100/50 one puff b.i.d.  6. Metformin 500 mg b.i.d.  7. Lasix 40 mg every day.  8. Lipitor 10 mg every day.  9. Toprol XL 50 mg every day.  10.Norvasc 10 mg every day.  11.Clonidine 0.1 mg b.i.d.  12.Plavix 75 mg every day.  13.Imdur 15 mg every day.  14.Relafen 750 mg b.i.d.  15.Potassium chloride 20 meq every day.  16.Protonix 40 mg b.i.d.  17.Patanol eye drops as prescribed.  18.Glycopyrrolate 2 mg b.i.d.  19.Nitroglycerin 0.4 mg sublingual p.r.n. chest pain.   OUTSTANDING LABS AND STUDIES:  None.   Duration of discharge encounter 33 minutes including physician time.     [name], ANP      Carlena Bjornstad, MD, [facility]  Electronically Signed   CB/MEDQ  D:  09/04/2006  T:  09/04/2006  Job:  [id number]   cc:   Deborra Medina. Lenna Gilford, MD

## 2010-09-12 NOTE — H&P (Signed)
NAMESAMAIYA, AWADALLAH NO.:  [id number]   MEDICAL RECORD NO.:  [id number]          PATIENT TYPE:  INP   LOCATION:  3733                         FACILITY:  [place]   PHYSICIAN:  Denice Bors. Stanford Breed, MD, FACCDATE OF BIRTH:  03/07/1941   DATE OF ADMISSION:  09/03/2006  DATE OF DISCHARGE:                              HISTORY & PHYSICAL   CHIEF COMPLAINT:  Chest pain.   HISTORY OF PRESENT ILLNESS:  This is a 70 year old female patient with a  history of coronary artery disease status post bare metal stenting to  the LAD in 2002 with nonobstructive disease and patent stent in the LAD  in February 2007 by cardiac catheterization who presents to the  emergency room today with chest discomfort.  She woke up about 7:00 or  7:30 this morning, has been fairly constant all day.  She notes that it  is left-sided and sharp.  She also notes associated shortness of breath,  nausea and diaphoresis.  She denies syncope but has been lightheaded.  She notes chronic chest pain.  Today's episode of chest pain has been  worse.  She notes that at its worse it has been about a 7 out of 10.   PAST MEDICAL HISTORY:  Significant for:  1. Coronary artery disease status post bare metal stent in LAD in      2002, cardiac catheterization February 2007 with patent stent in      the LAD, second diagonal, apical LAD, 40% RCA, proximal 20%, EF      65%.  2. Hypertension.  3. Diabetes mellitus.  4. Hyperlipidemia.  5. Irritable bowel syndrome.  6. Hemorrhoids.  7. Fibromyalgia.  8. Gastroesophageal reflux disease.  9. Asthmatic bronchitis.  10.Osteoarthritis.  11.Status post hysterectomy.  12.Anxiety.  13.History of thyroid nodule.  14.Status post cholecystectomy.  15.History of congestive heart failure.  16.Status post bilateral tubal ligation.   MEDICATIONS:  Flonase, Singulair 10 mg daily, Guaifenex DM b.i.d.,  Allegra 60 mg b.i.d. Advair 100/50 b.i.d., metformin 500 mg b.i.d.,  Lasix 40  mg daily, Lipitor 10 mg daily, Toprol XL 50 mg a day, Norvasc  10 mg a day, Clonidine 0.1 mg b.i.d., Plavix 75 mg daily, Imdur 30 mg  half tablet daily, Relafen 750 mg b.i.d., potassium chloride 20 meq  daily, Proctosol cream 2.5%,  cholestyramine powder p.r.n., Protonix 40  mg b.i.d., Patanol eye drops, Xifaxan p.r.n., Glycopyrrolate 2 mg  b.i.d., Pulmicort nebulizer.   SOCIAL HISTORY:  She lives in [place].  She is disabled secondary to  fibromyalgia.  She denies tobacco or alcohol abuse.   FAMILY HISTORY:  Significant for coronary disease.   REVIEW OF SYSTEMS:  Please see HPI.  Denies any fevers, chills,  headache, sore throat.  She does have some hemorrhoidal bleeding from  time to time.  Denies any melena.  Denies dysuria, hematuria.  She  denies orthopnea or paroxysmal nocturnal dyspnea.  She denies any cough.  The rest of the review of systems are negative.   PHYSICAL EXAM:  GENERAL:  She is a well-nourished, well-developed  female.  VS:  Blood pressure  120/75, pulse 86, respirations 20, temperature 98.2.  HEAD:  Normocephalic, atraumatic.  Eyes:  PERLA.  Sclerae clear.  NECK:  Without JVD.  LYMPH:  Without lymphadenopathy.  Without thyromegaly.  Carotids without  bruits bilaterally.  CARDIAC:  S1 and S2.  Regular rate and rhythm without murmurs.  LUNGS:  Clear to auscultation bilaterally without wheezes, rhonchi or  rales.  ABDOMEN:  Nontender with normoactive bowel sounds.  No organomegaly.  EXTREMITIES:  Without cyanosis, clubbing or edema.  MUSCULOSKELETAL:  No joint deformity.  She does have left chest pain  with palpation.  NEUROLOGIC:  She is alert and oriented x3. Cranial nerves II-XII grossly  intact.   Electrocardiogram reveals a sinus rhythm at a rate of 86, normal axis,  no acute changes.  Chest x-ray and laboratory data pending.   IMPRESSION AND PLAN:  This is a 70 year old female patient with a  history of coronary disease as outlined above who presents  to the  emergency room today with substernal chest discomfort, awakening her  from sleep around 7:30.  She has had some associated shortness of breath  and nausea as well as diaphoresis.  Presently her EKG is without  changes.  Her pain has been constant.  She was also interviewed and  examined by Dr. Kirk Ruths.  We plan to admit her to observation.  Will check serial cardiac markers and continue her home medications.  If  her enzymes remain negative she can be discharged to home in the morning  with plans for possible outpatient Myoview testing.  She will need  follow-up with Dr. Ron Parker.      Richardson Dopp, PA-C      Denice Bors. Stanford Breed, MD, [facility]  Electronically Signed    SW/MEDQ  D:  09/03/2006  T:  09/04/2006  Job:  [id number]   cc:   Deborra Medina. Lenna Gilford, MD

## 2010-09-12 NOTE — Assessment & Plan Note (Signed)
[place] OFFICE NOTE   NAME:[name], [name]                      MRN:          [id number]  DATE:11/08/2006                            DOB:          08/09/1940    PROBLEM:  Chronic diarrhea, intermittent rectal bleeding, and abdominal  discomfort.   HISTORY:  [name] is a 70 year old African-American patient of Dr.  Jeannine Kitten. She does have history of coronary artery disease, and has had  stent greater than two years ago. She has history of asthmatic  bronchitis, diabetes, hyperlipidemia, irritable bowel, fibromyalgia, and  GERD. She is status post hysterectomy and cholecystectomy. She had seen  Dr. Fuller Plan in September 2007, at that time complaining of diarrhea  associated with hematochezia. She was scheduled for colonoscopy which  was done on 03/25/2006. She did have two small lesions consistent with  lipomas, and one small polyp removed. She had random biopsies taken.  They showed no active colitis or dysplasia. The polyp was consistent  with an adenoma. The patient says that really over the past year she has  been having chronic problems with diarrhea and says that she never has a  normal bowel movement. She has abdominal discomfort associated with  bowel movements and sometimes says that she actually gets cramping and  then vomits after she has had a couple of bowel movements. She is  generally having 5 or 6 bowel movements per day, but had one day within  the past week when she says that she may have gone 20 times. She does  feel that her symptoms have progressed recently. She says that generally  she is vomiting once or twice a week, but is fine in between. She has  been taking the Questran powder which was prescribed last fall without  any benefit. She had seen Dr. Lenna Gilford earlier in the week who gave her  some Canasa suppositories and Analpram cream for her rectal bleeding,  and says that has not helped so  far either. She had also been given  Robinul in the past without any benefits.   CURRENT MEDICATIONS:  1. Flonase twice daily.  2. Singulair 10 mg daily.  3. GTol 200 2 tablets daily.  4. Allegra 260 mg tablets daily.  5. Advair 10/50 2 daily.  6. Metformin 500 b.i.d.  7. Lasix 40 daily.  8. Lipitor 10 daily.  9. Toprol 50 daily.  10.Norvasc 10 daily.  11.Clonidine HCL 0.1 b.i.d.  12.Plavix 75 daily.  13.Isosorbide 30 1/2 tablet daily.  14.Relafen 750 b.i.d.  15.Potassium 20 daily.  16.Proctosol 2.5 b.i.d.  17.Questran once or twice daily.  18.Protonix 40 daily.   ALLERGIES:  CODEINE, CAFFEINE, and CIPRO.   PHYSICAL EXAMINATION:  GENERAL:  Well developed obese African-American  female in no acute distress.  VITAL SIGNS:  Weight 193, blood pressure 124/70, pulse 68.  CARDIOVASCULAR:  Regular rate and rhythm with S1, S2 no murmurs, rubs,  or gallops.  PULMONARY:  Clear to auscultation and percussion.  ABDOMEN:  Soft. She is minimally tender in the lower abdomen and not  focally. There is no guarding or rebound.  No mass or hepatosplenomegaly.  RECTAL:  Prolapsed internal hemorrhoid. She is quite tender to exam. She  is hemoccult negative currently.   1. CT scan of the abdomen and pelvis done 11/06/2006 per Dr. Lenna Gilford shows      some rectosigmoid mild wall thickening and diverticular changes. No      definite diverticulitis. She has numerous lymph nodes prominent in      the perirectal area and throughout the lower pelvis. These are non-      specific. Could not rule out a mural rectal lesion.  2. Status post hysterectomy.  3. Prominent inferior mesenteric vein and left ovarian vein, non-      specific. The ovaries are not mentioned. She has bilateral renal      cysts and a small left upper pole renal lesion, small plural      effusions.   IMPRESSION:  94. 70 year old African-American female with fairly chronic diarrhea,      etiology is not clear. Recent colonoscopy and  random biopsies were      negative, but now with CT changes which are non-specific, but      suggest some sort of pelvic pathology or perirectal pathology.      There is no definite diverticulitis seen on the CT scan. We will      hold on antibiotics. Schedule pelvic ultrasound and flexible      sigmoidoscopy for further evaluation. Question colitis. Also      reviewing her multiple medications, she may be on Relafen, if so      would discontinue with the complaints of diarrhea.  2. Rectal bleeding, secondary to hemorrhoids. She did have internal      hemorrhoids on the previous colonoscopy and with ongoing complaint      of bleeding, may benefit from hemorrhoidal injection.  3. Small renal lesion noted on CT of the abdomen, could not rule a      small neoplasm.  4. Coronary artery disease on Plavix.  5. Other diagnoses as outlined above.   PLAN:  1. Pelvic ultrasound, transvaginal.  2. Flexible sigmoidoscopy with Dr. Fuller Plan with possible injection of      her internal hemorrhoid.  3. Defer to Dr. Lenna Gilford to follow up on small renal lesions seen on CT.  4. Continue Canasa suppositories nightly, Questran 4 grams daily, and      Analpram b.i.d. to t.i.d. p.r.n.      [name], PA-C  Electronically Signed      Milus Banister, MD  Electronically Signed   AE/MedQ  DD: 11/08/2006  DT: 11/10/2006  Job #: [id number]

## 2010-09-12 NOTE — H&P (Signed)
NAMEDRISANA, SCHWEICKERT NO.:  [id number]   MEDICAL RECORD NO.:  [id number]          PATIENT TYPE:  INP   LOCATION:  2922                         FACILITY:  [facility]   PHYSICIAN:  Rosanne Sack, ACNP  DATE OF BIRTH:  [birth year]   DATE OF ADMISSION:  10/13/2007  DATE OF DISCHARGE:                              HISTORY & PHYSICAL   CARDIOLOGIST:  Carlena Bjornstad, MD, [facility]   PRIMARY CARE PHYSICIAN:  Deborra Medina. Lenna Gilford, MD   GI:  Pricilla Riffle. Fuller Plan, MD, [facility]   [name] is a 70 year old African American female with known history  of coronary artery disease, status post placement of a bare metal stent  to the LAD in 2002.  Repeat catheterization in 2007 secondary to chest  discomfort.  The patient was found to have patent stent by cath.  Most  recent workup in 2008.  The patient had a stress Myoview in May 2009  that showed EF of 64%, no ischemia.  [name] presents to the  emergency room today complaining of chest discomfort.  Initial symptoms  started on Saturday morning.  She states she woke up with pain in her  left back and shoulder area and had numbness down her left arm.  She  went to a birthday party, continued to feel poorly all day, and then  started having chest discomfort later that day.  Initially, she  described as a substernal chest discomfort.  She states it felt like  pins and needles and radiating to her left breast.  There, she  describes as tightness and stabbing sensation at times that radiates  into her left neck.  The symptoms have been ongoing Saturday.  They wax  and wane and it is worse.  She rates it 10 on a scale of 1-10.  She took  1 nitroglycerin on 3 different occasions this weekend that brought the  pain from a 10 to an 8 each time.  She states she also tried her  Protonix extra dose without relief.  Family states she had peripheral  edema over the week.  It was worse than usual.  The patient stays  compliance with her Lasix.   Daughter-in-law also states she had  increased shortness of breath.   PAST MEDICAL HISTORY:  1. Coronary artery disease as described above.  2. Hypertension.  3. Dyslipidemia.  4. Fibromyalgia.  5. Diastolic CHF.  6. History of thyroid and pulmonary nodules by CT scan.  7. Degenerative joint disease.  8. Asthmatic bronchitis.  9. Ulcerative colitis.  10.Chronic diarrhea/irritable bowel syndrome.  11.GERD.  12.Diabetes.  13.Anxiety and depression.  14.Status post cholecystectomy.   SOCIAL HISTORY:  She lives in [place] with her son.  She is disabled.  She is divorced.  She quit using tobacco products 20 years ago.  She  ambulates with the assistance of a cane.  She denies any EtOH drug or  herbal medication use.   FAMILY HISTORY:  Positive for CAD in mother and father.   REVIEW OF SYSTEMS:  Positive for sweats, headache, chest pain, shortness  of breath, dyspnea on  exertion, edema, cough, generalized weakness,  numbness in left arm, myalgia, arthralgia pain in joints, intermittent  nausea, and heat intolerance.  All other systems reviewed and negative.   ALLERGIES:  The patient states the following medications cause severe  nausea and vomiting including TYLOX, CARAFATE, ASPIRIN, CIPRO, CODEINE  and CAFFEINE.   CURRENT MEDICATIONS:  Advair, Flonase, metformin, Patanol, Plavix,  isosorbide, Protonix, Pulmicort, Relafen, Singulair, cholestyramine, Nu-  Iron, albuterol p.r.n., potassium 20 mEq daily, Lasix 40 mg b.i.d.,  clonidine 0.1 mg b.i.d., Norvasc 10 daily, atenolol 50 daily, Lipitor 10  daily, vitamin B6, vitamin N12, and folic acid.   PHYSICAL EXAMINATION:  VITAL SIGNS:  Temperature 98, heart rate 78,  respirations 22, blood pressure 145/94, and sat 95% on room air.  GENERAL:  In no acute distress, but currently rating her pain 4-5.  HEENT:  Unremarkable.  NECK:  Supple without lymphadenopathy.  No bruits.  No JVD.  CARDIOVASCULAR:  S1 and S2.  Regular rate and  rhythm.  Lungs: Clear to auscultation.  SKIN:  Warm and dry.  ABDOMEN:  Soft, nontender, positive bowel sounds, and obese.  LOWER EXTREMITIES:  Without clubbing or cyanosis.  She has +1 nonpitting  edema bilaterally.  NEUROLOGICAL:  Alert and oriented x3.   Chest x-ray showing cardiomegaly with pulmonary venous hypertension and  right basilar atelectasis.  EKG, sinus rhythm.  Initial blood work, H&H  30.6 and 40.  Sodium 140, potassium 3.6, BUN 9, creatinine 0.4, and  glucose 111.  First point-of-care marker, troponin 0.06   IMPRESSION:  Chest pain with known coronary artery disease.  Dr. Dola Argyle in to examine and assess the patient.  [name] chest pain is  always difficult to assess.  The patient has mildly elevated troponin  point of care.  The patient will be admitted.  Cycle enzymes, continue  home meds, hold her metformin., add Lovenox, and cardiac cath in the  a.m.      Rosanne Sack, ACNP     MB/MEDQ  D:  10/13/2007  T:  10/14/2007  Job:  [id number]

## 2010-09-12 NOTE — Assessment & Plan Note (Signed)
[place] OFFICE NOTE   NAME:Hocevar, [name]                      MRN:          [id number]  DATE:12/24/2007                            DOB:          04-02-1941    [name] is doing well today.  In June 2009, she had been in the  hospital with some chest discomfort.  Decision was made to proceed with  cardiac catheterization.  The study was done.  She had nonobstructive  disease.  The ejection fraction was normal.  LAD stent was patent.  Her  D-dimer was low.  It was felt she could be discharged home.  She did  feel much better.  Volume overload has been an ongoing issue.  I have  talked with her extensively about salt and fluid intake.  She does watch  her salt.  Her overall fluid intake is increased.  She drinks some extra  water and I have once again encouraged her not to.  She seems to need a  higher dose of diuretics, however.  She does have ongoing peripheral  edema.   PAST MEDICAL HISTORY:   ALLERGIES:  CODEINE and CIPRO.   MEDICATIONS:  See the EMR sheet and another sheet.  Medications include  Flonase, Singulair, Advair, metformin, Lipitor, Norvasc, clonidine,  Plavix, isosorbide, Relafen, potassium, cholestyramine, Allegra,  atenolol, Nu-Iron and Lasix 40 b.i.d. (to be increased to 80 in the  morning and 40 in the evening).   OTHER MEDICAL PROBLEMS:  See the list on the EMR note by Dr. Lenna Gilford and  on my prior note dated May 2008.   REVIEW OF SYSTEMS:  Other than some mild peripheral edema, her review of  systems is negative.   PHYSICAL EXAMINATION:  VITAL SIGNS:  Today, her weight is 204 pounds.  Blood pressure is 120/72 with a pulse of 61.  GENERAL:  The patient is oriented to person, time, and place.  Affect is  normal.  HEENT:  No xanthelasma.  She has normal extraocular motion.  NECK:  There are no carotid bruits.  There is no jugular venous  distention.  LUNGS:  Clear.  Respiratory  effort is not labored.  CARDIAC:  An S1 with an S2.  There are no clicks or significant murmurs.  She does have 1+ peripheral edema.   PROBLEMS:  1. Nonobstructive coronary artery disease.  2. Chest discomfort with cath in June 2009.  Her coronaries reveal      mild disease and this is stable.  3. Volume overload.  I have asked her to be sure she does not take in      any more fluid.  We will      increase her Lasix from 40 b.i.d. to 80 in the morning and 40 in      the evening.  She will have her BMET checked in 2 weeks and I will      see her back in 3 months.     Carlena Bjornstad, MD, [facility]  Electronically Signed    JDK/MedQ  DD: 12/24/2007  DT: 12/24/2007  Job #: [id number]

## 2010-09-12 NOTE — Op Note (Signed)
[name], [name] NO.:  [id number]   MEDICAL RECORD NO.:  [id number]          PATIENT TYPE:  AMB   LOCATION:  DAY                          FACILITY:  [facility]   PHYSICIAN:  Marshall Cork. Jeffie Pollock, [name]    DATE OF BIRTH:  04/28/1941   DATE OF PROCEDURE:  DATE OF DISCHARGE:  01/13/2008                               OPERATIVE REPORT   PROCEDURES:  1. Cystoscopy.  2. Bladder wash cytology.  3. Bilateral retrograde pyelograms with interpretation.  4. Bladder biopsy with fulguration.   PREOPERATIVE DIAGNOSIS:  Bladder wall lesion with a history of  persistent dysuria and hematuria.   POSTOPERATIVE DIAGNOSIS:  Bladder wall lesion with a history of  persistent dysuria and hematuria.   SURGEON:  Marshall Cork. Jeffie Pollock, [name]   ANESTHESIA:  General.   SPECIMEN:  Bladder biopsies.   DRAINS:  None.   COMPLICATIONS:  None.   INDICATIONS:  [name] is a 70 year old black female who was sent for  persistent dysuria.  She was found to have hematuria and she also has  significant incontinence.  An office cystoscopy revealed an erythematous  lesion on the posterior wall.  It was felt that biopsy was indicated.  She also has a small renal mass that is going to need followup in the  future.   FINDINGS AND PROCEDURE:  The patient is given Ancef and a general  anesthetic was induced.  She was fitted with PAS hose and placed in  lithotomy position.  Her perineum and genitalia were prepped with  Betadine solution.  She was draped in the usual sterile fashion.  Cystoscopy was performed using a 22-French scope and 12 and 70-degree  lenses.  Examination revealed normal urethra.  The ureteral orifices  were unremarkable.  The bladder wall was smooth with minimal  trabeculation.  On the posterior wall just to the left to midline was  somewhat fleshy-appearing area that was felt to be the likely residual  of the more markedly erythematous lesion seen in the office.  No  papillary tumors or  stones were seen.   After a thorough inspection, retrograde pyelography was performed  bilaterally using an 8 cone-tip catheter and contrast.   The right retrograde pyelogram was unremarkable.   Left retrograde pyelogram was unremarkable.   Once the retrograde pyelograms had been completed, a cup biopsy forceps  was used to biopsy the abnormal area on the posterior wall.  The biopsy  site was then fulgurated with a Bugbee electrode.  Thorough inspection  revealed an intact bladder wall with the exception of the mucosal  biopsy.  The bladder was then drained.  The scope was removed.  The  patient was taken down from lithotomy position.  Her anesthetic was  reversed.  She was moved to the recovery room in stable condition.  There were no complications.      Marshall Cork. Jeffie Pollock, [name]  Electronically Signed     JJW/MEDQ  D:  01/13/2008  T:  01/14/2008  Job:  [id number]   cc:   Deborra Medina. Lenna Gilford, [place]  [place] [address]

## 2010-09-12 NOTE — Assessment & Plan Note (Signed)
[place] OFFICE NOTE   NAME:[name], [name]                      MRN:          [id number]  DATE:06/20/2007                            DOB:          1941/01/05    [name] is stable today.  I had seen [name] on May 27, 2007.  At  that time we talked about decreasing fluid intake and increasing [name]  Lasix.  [name] has done this.  [name] actually did diurese.  A few days ago  [name] was here and [name] weight was lower.  It appears that it has crept  back up today.  Some of it may be related to heavier clothes for the  cold weather.  Overall, [name] is stable.  [name] is not having any chest  pain.   PAST MEDICAL HISTORY:  Allergies:  CODEINE and CIPRO.   Medications:  The current dosing of [name] medications include:  1. Flonase one puff b.i.d.  2. Singulair.  3. Allegra.  4. Advair.  5. Metformin 500 b.i.d.  6. Lasix 40 b.i.d.  7. Lipitor 10.  8. Toprol 50.  9. Norvasc 10.  10.Clonidine 0.1 b.i.d.  11.Plavix 75.  12.Isosorbide 15.  13.Relafen.  14.Potassium 20 mEq.  15.Polystyrene powder.  16.Protonix.  17.Several eyedrops and creams and vitamins.  18.Pulmicort.  19.Albuterol.  20.Nu-Iron.   Other medical problems:  See the list on my note of May 2008.   REVIEW OF SYSTEMS:  Today, [name] is feeling well.  [name] has no significant  complaints.   PHYSICAL EXAMINATION:  Weight is 203 pounds.  Blood pressure is 115/80  with a pulse of 80.  The [name] is oriented to person, time and place.  Affect is normal.  HEENT:  Reveals no xanthelasma.  [name] has normal extraocular motion.  There are no carotid bruits.  There is no jugular venous distention.  LUNGS:  Clear.  Respiratory effort is not labored.  CARDIAC EXAM:  Reveals an S1 with an S2.  There are no clicks or  significant murmurs.  ABDOMEN:  Obese but soft.  LEGS:  Large with only trace peripheral edema.   Problems are listed on the note of May 2008.  [name] volume  status is  stable.  [name] overall cardiovascular status is stable.  I will see [name]  back in 6 months.     Carlena Bjornstad, MD, [facility]  Electronically Signed    JDK/MedQ  DD: 06/20/2007  DT: 06/21/2007  Job #: [id number]

## 2010-09-12 NOTE — Assessment & Plan Note (Signed)
[place] OFFICE NOTE   NAME:Coughlin, GILBERTA PEETERS                      MRN:          [id number]  DATE:05/27/2007                            DOB:          06/18/1940    HISTORY:  Ms. Lavey is here for followup.  She says that she is  feeling some mild shortness of breath and has some edema.  Her weight  has increased significantly.  There is question that this is fluid  overload.  She has had some fluid overload in the past.   ALLERGIES:  1. CIPRO.  2. CODEINE.   MEDICATIONS:  1. Flonase.  2. Singulair.  3. Decongestant.  4. Allegra.  5. Advair.  6. Metformin.  7. Lasix 40 (to be increased to 40 b.i.d.)  8. Lipitor.  9. Toprol.  10.Norvasc.  11.Clonidine.  12.Plavix.  13.Isosorbide.  14.Relafen.  15.Potassium.  16.Proctosol.  17.Cholestyramine.   OTHER MEDICAL PROBLEMS:  See the complete list on my note of Sep 18, 2006.   REVIEW OF SYSTEMS:  See the HPI.   PHYSICAL EXAMINATION:  VITAL SIGNS:  Blood pressure is 130/78 with a  pulse of 90.  Weight is 203 pounds (increased by 10 pounds since the  last visit in the chart.  GENERAL:  The patient is oriented to person, time and place.  Affect is  normal.  HEENT:  Reveals no xanthelasma.  She has normal extraocular  motion.  NECK:  There are no carotid bruits.  There is no jugular venous  distention.  LUNGS:  Clear.  Respiratory effort is not labored.  CARDIAC:  Reveals S1-S2.  There are no clicks or significant murmurs.  ABDOMEN:  Obese, but soft.  EXTREMITIES:  She does have 1+ peripheral edema.   Problems are listed in the note of May 2008.  Today, she appears to be  mildly volume overloaded.  I reviewed with her the issue of limiting  salt and fluid intake.  She does like to drink a lot of water.  In  addition, we will increase her Lasix to 40 b.i.d.  We will check a BMET  in 10 days and see her back in the few weeks.     Carlena Bjornstad,  MD, [facility]  Electronically Signed    JDK/MedQ  DD: 05/27/2007  DT: 05/27/2007  Job #: [id number]

## 2010-09-12 NOTE — Assessment & Plan Note (Signed)
[place] OFFICE NOTE   NAME:Kingbird, [name]                      MRN:          [id number]  DATE:03/09/2008                            DOB:          11-12-1940    [name] is doing well.  She has mild coronary artery disease.  She  did receive a stent to the LAD in 2002.  Repeat cath has not shown any  significant problems.  Her last nuclear study was in May 2008 with no  ischemia.  She is not having any significant chest pain.  She is doing  well.  She also has some problems with volume overload.  If she does not  drink too much extra water, she remained stable.   PAST MEDICAL HISTORY:   ALLERGIES:  See the flow sheet.   MEDICATIONS:  See the flow sheet and EMR.   OTHER MEDICAL PROBLEMS:  See the complete problem list on my prior notes  and the EMR problem list.   REVIEW OF SYSTEMS:  She is not having any GI problems.  She had had some  GU difficulties and she has been assessed fully by urology.  She had a  bladder abnormality, but a biopsy showed no definite problem.  There is  question of a spot on her kidney and this is being followed.  Otherwise, review of systems is negative.   PHYSICAL EXAMINATION:  VITAL SIGNS:  Blood pressure is 127/80.  Her  pulse is 64.  GENERAL:  The patient is oriented to person, time, and place.  Affect is  normal.  HEENT:  No xanthelasma.  She has normal extraocular motion.  NECK:  There are no carotid bruits.  There is no jugular venous  distention.  LUNGS:  Clear.  Respiratory effort is not labored.  CARDIAC:  An S1 with an S2.  There are no clicks or significant murmurs.  ABDOMEN:  Soft.  She has no significant peripheral edema.   EKG today reveals sinus rhythm.  There are nonspecific ST-T wave  changes.   Problems are listed on the prior note and in the EMR problem list.  1. Hypertension, controlled.  2. Volume overload, controlled.  3. Coronary artery  disease, mild, controlled.   No change in her meds at this time.  I can see her back in 9 months for  followup.    Carlena Bjornstad, MD, [facility]  Electronically Signed   JDK/MedQ  DD: 03/09/2008  DT: 03/09/2008  Job #: [id number]

## 2010-09-12 NOTE — Discharge Summary (Signed)
[name], PERRY NO.:  [id number]   MEDICAL RECORD NO.:  [id number]          PATIENT TYPE:  INP   LOCATION:  2014                         FACILITY:  [facility]   PHYSICIAN:  Carlena Bjornstad, MD, FACCDATE OF BIRTH:  04/03/1941   DATE OF ADMISSION:  10/13/2007  DATE OF DISCHARGE:                               DISCHARGE SUMMARY   DISCHARGE DIAGNOSES:  1. Prolonged atypical chest discomfort of uncertain etiology.  2. Nonobstructive coronary artery disease with preserved left      ventricular function by catheterization.  3. Hypertension.  4. Hypokalemia.  5. Next history as previously listed below.   PROCEDURES PERFORMED:  Cardiac catheterization on October 14, 2007, by Dr.  Pierre Bali.   SUMMARY OF HISTORY:  [name] is a 70 year old African American  female who presented to the emergency room complaining of chest  discomfort that began initially several days prior.  She described this  as a tightness and stabbing sensation radiating into her left neck.  They do wax and wane and at its worst, it is at a 10.  She has taken  nitroglycerin occasionally that has reduced the discomfort to an 8,  although she has not obtained any relief with nitroglycerin or with  Protonix.  Daughter-in-law stated she has also noticed she has been  slightly more short of breath than usual.   PAST MEDICAL HISTORY:  Notable for coronary artery disease with bare  metal stenting to the LAD in 2002.  A stress Myoview in May 2009 showed  an EF of 64% and no ischemia.  Last catheterization in 2007 showed  patent circulation.   Her past medical history is also notable for:  1. Hypertension.  2. Dyslipidemia.  3. Fibromyalgia.  4. Diastolic CHF.  5. Thyroid and pulmonary nodules by CT scan.  6. DJD.  7. Asthmatic bronchitis.  8. Ulcerative colitis.  9. Chronic diarrhea with irritable bowel syndrome.  10.GERD.  11.Diabetes.  12.Anxiety.  13.Depression.  14.Status post  cholecystectomy.  15.Remote tobacco use.   LABORATORY DATA:  Admission weight was 88.45 kg.  Admission H&H was 12.4  and 36.8, normal indices, platelets 435, WBC 8.8.  At time of discharge  H&H of 12.6 and 37.1, normal indices, platelets 394, WBC 10.1.  PTT 35,  PT 13.7.  On the 16th sodium was 140, potassium 3.2, BUN 5, creatinine  0.47, glucose 116, normal LFTs.  At the time of discharge BUN 15,  creatinine 0.49, glucose 139, potassium 3.9.  Hemoglobin A1c was  slightly elevated at 6.6.  CK, MBs, relative indices and troponins were  within normal limits x2.  BNP was 32.  TSH 1.002.  Chest x-ray on the  15th showed cardiac enlargement with pulmonary venous hypertension,  right basilar atelectasis.  EKGs show normal sinus rhythm, left axis  deviation, nonspecific ST-T wave changes.   HOSPITAL COURSE:  The patient was admitted to [facility] for further  evaluation of her discomfort.  Overnight, despite ruling out for  myocardial infarction she continued to have chest discomfort.  Her  hypertension on admission had improved.  She  did not receive ASPIRIN due  to her ASPIRIN allergy.  Prior to catheterization, potassium  supplementation was performed.  Catheterization was performed on October 14, 2007, by Dr. Haroldine Laws and showed nonobstructive coronary artery  disease with a patent LV with an LAD stent and normal ejection fraction.  After review by Dr. Ron Parker, Dr. Ron Parker felt that she be discharged home.  He  had also checked a D-dimer prior to discharge and this was 0.36.   DISPOSITION:  The patient is discharged home.  She is asked to maintain  a low-sodium, heart-healthy ADA diet.  Wound care and activities are per  post catheterization supplemental sheet.  She will follow up with Dr.  Ron Parker on December 24, 2007, at 9:15.  She was asked to resume her metformin  on the morning of the 18th and bring all medications to all  appointments.   Her medications remain unchanged from admission.   These include:  1. Potassium 20 mEq daily.  2. Proctosol 2.5% cream to rectum p.r.n.  3. Cholestyramine powder 1/2 to 1 teaspoon as needed for diarrhea.  4. Protonix 20 mg b.i.d.  5. Lasix 40 mg b.i.d.  6. Patanol 1 drop to eye twice a day.  7. Nu-Iron 150 mg daily.  8. Darvocet p.r.n.  9. Guaifenex DM 2 tablets b.i.d.  10.Xifaxan 200 mg three times a day.  11.Glycopyrrolate  2 mg b.i.d.  12.Mesalamine suspension 4 g at bedtime.  13.Flonase twice a day.  14.Singulair 10 mg daily.  15.Advair 100/50 mcg twice a day.  16.Lasix 40 mg b.i.d.  17.Lipitor 10 mg daily.  18.Atenolol 50 mg daily.  19.Norvasc 10 mg daily.  20.Clonidine 0.1 mg b.i.d.  21.Plavix 75 mg daily.  22.Isosorbide 30 mg 1/2 tablet daily.  23.__________ 750 mg b.i.d.  24.Xanax 0.5 mg nightly.  25.AbOTIC ear drops 2 drops each ear daily.   DISCHARGE TIME:  35 minutes.      [name], PA-C      Carlena Bjornstad, MD, [facility]  Electronically Signed    EW/MEDQ  D:  10/15/2007  T:  10/15/2007  Job:  [id number]   cc:   Deborra Medina. Lenna Gilford, MD  Pricilla Riffle. Fuller Plan, MD, [facility]

## 2010-09-12 NOTE — Assessment & Plan Note (Signed)
[name]                            CARDIOLOGY OFFICE NOTE   NAME:[name], [name]                      MRN:          [id number]  DATE:09/18/2006                            DOB:          12-05-1940    REFERRING PHYSICIAN:  Carlena Bjornstad, MD, [facility]   This is a pleasant 70 year old African American female who has a history  of coronary artery disease and was recently admitted with sharp left-  sided chest pain associated with numbness in her left neck and arm.  She  ruled out for an MI and was scheduled for an outpatient adenosine  Myoview.  Her potassium was 3.3 in the hospital, and she was put on  replacement.   Adenosine Myoview showed normal contractility and thickening in all  areas of the myocardium.  The overall LV function is normal at 64%.  No  significant ST segment changes suggestive of ischemia.  Probable normal  perfusion, with minimal soft tissue attenuation.   The patient says she has had minor chest pain since she has been home  from the hospital but feels it is related to her fibromyalgia and/or  gastroesophageal reflux.   CURRENT MEDICATIONS:  1. Flonase twice a day.  2. Singulair 10 mg daily.  3. Allegra 60 mg b.i.d.  4. Advair 100/50 b.i.d.  5. Metformin 500 mg 2 daily.  6. Lasix 40 mg daily.  7. Lipitor 10 mg daily.  8. Toprol 50 mg daily.  9. Norvasc 10 mg daily.  10.Clonidine 0.1 mg b.i.d.  11.Plavix 75 mg daily.  12.Imdur 30 mg 1/2 daily.  13.Relafen 750 mg b.i.d.  14.Cholestyramine b.i.d.  15.Protonix b.i.d.  16.Potassium 20 mEq daily.   PHYSICAL EXAMINATION:  GENERAL:  This is a pleasant 70 year old African  American female in no acute distress.  VITAL SIGNS:  Blood pressure 123/81, pulse 77, weight 193.  NECK:  Without JVD, HJR, bruit, or thyroid enlargement.  LUNGS:  Clear anterior, posterior, and lateral.  HEART:  Regular rate and rhythm at 77 beats per minute, normal S1 and  S2.  No murmur, rub, bruit,  thrill, or heave noted.  ABDOMEN:  Soft, without organomegaly, masses, lesions, or abnormal  tenderness.  EXTREMITIES:  Without cyanosis, clubbing, or edema.  She has good distal  pulses.   IMPRESSION:  1. Chest pain, felt to be noncardiac at this time.  Adenosine      Cardiolite on Sep 13, 2006 showed probable normal perfusion, with      minimal soft tissue attenuation.  EF 64%.  2. Coronary artery disease, status post bare metal stenting to the      left anterior descending in 2002, with patent stent in the left      anterior descending in February 2007.  3. Hypertension.  4. Diabetes mellitus.  5. Hyperlipidemia.  6. Irritable bowel syndrome.  7. Hemorrhoids.  8. Fibromyalgia.  9. Gastroesophageal reflux disease.  10.Asthmatic bronchitis.  11.Osteoarthritis.  12.Anxiety.  13.Status post hysterectomy.  14.History of thyroid nodule.  15.History of congestive heart failure.  16.Status post cholecystectomy.   PLAN AT THIS TIME:  The patient is stable from a cardiac standpoint.  She is very satisfied with her stress test results and is leaving for  the beach on Friday.  She will see Dr. Ron Parker back in 2-3 months for  followup.      Ermalinda Barrios, PA-C  Electronically Signed      Ethelle Lyon, MD  Electronically Signed   ML/MedQ  DD: 09/18/2006  DT: 09/18/2006  Job #: [id number]

## 2010-09-12 NOTE — Discharge Summary (Signed)
[name], [name] NO.:  [id number]   MEDICAL RECORD NO.:  [id number]          PATIENT TYPE:  INP   LOCATION:  6529                         FACILITY:  [facility]   PHYSICIAN:  Carlena Bjornstad, MD, FACCDATE OF BIRTH:  1940-09-21   DATE OF ADMISSION:  07/15/2008  DATE OF DISCHARGE:  07/16/2008                               DISCHARGE SUMMARY   PRIMARY CARDIOLOGIST:  Carlena Bjornstad, MD, [facility]   DISCHARGE DIAGNOSIS:  Noncardiac chest pain.   SECONDARY DIAGNOSES:  1. Coronary artery disease ( status post bare-metal stent to left      anterior descending in 2002, mild stable disease per last cath in      June 2009).  2. Hypertension.  3. Diastolic congestive heart failure.  4. Diabetes mellitus.  5. Anxiety.  6. Dyslipidemia.  7. Status post cholecystectomy.  8. Gastroesophageal reflux disease.  9. Fibromyalgia.  10.Asthmatic bronchitis.  11.Depression.  12.Anxiety.  13.Ulcerative colitis.  14.Degenerative joint disease.   ALLERGIES:  1. CODEINE  2. CIPROFLOXACIN.   PROCEDURES PERFORMED DURING THIS HOSPITALIZATION:  1. The patient had an EKG showed normal sinus rhythm with a rate of 63      beats per minute.  No acute ST-T wave changes, no significant Q-      waves, slight left axis deviation, no evidence of hypertrophy.  PR      192, QRS 88, QTc 425, normal axis.  2. The patient had a chest x-ray on July 15, 2008, that showed venous      hypertensive changes without overt airspace edema.  Focal opacity      in right lower lung, which may represent infiltrate.  Some      component of chronic atelectasis is present in this region on prior      chest x-rays.  3. The patient had EKG on July 16, 2008, with no significant change      from prior EKG.   HISTORY OF PRESENT ILLNESS:  A 70 year old female with a known history  of CAD (S/P BMS to LAD in 2002 and mild stable CAD per last cath in June  8242), diastolic CHF, hypertension, DM, dyslipidemia, as well  as  fibromyalgia, GERD, and anxiety, presenting with worsening chronic  atypical chest pain.  On July 12, 2008, the patient has study of one of  her kidneys to monitor known cyst when she had significant nausea and  one episode of emesis.  The patient also noted sharp substernal/right-  sided chest pain, lasting seconds without any associated symptoms.  Since then, her symptoms have continued with the addition of right lower  jaw pain and today, she noted tachy palpitations.  At worse, her pain is  8/10, it is nonexertional and not associated with a change to her  chronic shortness of breath or diaphoresis.  She notes no aggravating or  alleviating factors.   HOSPITAL COURSE:  The patient was admitted to telemetry and monitored as  well as having her cardiac enzymes cycled.  The patient had no  significant events on telemetry, 3 full sets of negative cardiac enzymes  and no changes to EKG on July 16, 2008 from prior EKGs.  The patient is  asymptomatic.  Her vital signs remained stable during her 24-hour  admission and will be discharged to follow up with her primary  cardiologist on August 11, 2008, at 8:15 a.m.  The patient had no change  to her medications, but will receive a complete list of her meds and  followup instructions in both oral and written form.  At the time of  discharge, the patient had no questions or concerns that had not been  addressed.   DISCHARGE LABORATORIES:  Sodium 138, potassium 3.9, chloride 105, CO2 of  24, BUN 8, creatinine 0.39, glucose 113, and calcium 9.2.  Hemoglobin  A1c 6.7.  The patient had 3 sets of negative cardiac enzymes; total  cholesterol 130, triglycerides 55, HDL 53, LDL 66, and VLDL 11.  TSH  0.882.  WBC 7.5, HGB of 13.0, HCT of 37.7, PLT count 440.   FOLLOWUP PLANS AND APPOINTMENTS:  Appointment with Dr. Ron Parker on August 11, 2008, at 8:15 a.m.   DISCHARGE MEDICATIONS:  No changes from history and physical dictated on  July 15, 2008.    DURATION OF DISCHARGE/ENCOUNTER INCLUDING PHYSICIAN TIME:  35 minutes.      Guss Bunde, [facility]      Carlena Bjornstad, MD, [facility]  Electronically Signed    MS/MEDQ  D:  07/16/2008  T:  07/17/2008  Job:  [id number]   cc:   Carlena Bjornstad, MD, [facility]

## 2010-09-12 NOTE — Assessment & Plan Note (Signed)
[facility]                                 ON-CALL NOTE   NAME:Kosar, TIFFINY                        MRN:          [id number]  DATE:09/03/2006                            DOB:          10/29/1940    Cardiologist:  Carlena Bjornstad, MD  Phone number:  [phone]   HISTORY:  Ms. Shew is a patient followed by Dr. Ron Parker with a history  of coronary artery disease, status post bare metal stenting to the LAD  in 2002 with recent cardiac catheterization February 2007 revealing  patent stents, nonobstructive disease elsewhere, and possible ostial  stenosis in the first diagonal in a very small vessel, which was all  treated medically, who called the answering service tonight with  complaints of chest pain.  She said that she has been having pain all  day.  She says it is central and left-sided chest pain.  She says it is  sharp.  She also notices some numbness in her left arm and neck.  She  has been short of breath as well as diaphoretic but denies nausea or  syncope.  She has had some lightheadedness.  She has taken some  nitroglycerin with some help but no complete relief.   PLAN:  I have advised the patient to go directly to the [facility]  emergency room via EMS.   DISPOSITION:  The patient agrees with the above plan.      Richardson Dopp, PA-C  Electronically Signed      Denice Bors. Stanford Breed, MD, [facility]  Electronically Signed   SW/MedQ  DD: 09/03/2006  DT: 09/04/2006  Job #: [id number]

## 2010-09-12 NOTE — H&P (Signed)
NAMECHESSIE, NEUHARTH NO.:  [id number]   MEDICAL RECORD NO.:  [id number]          PATIENT TYPE:  INP   LOCATION:  6529                         FACILITY:  [place]   PHYSICIAN:  Denice Bors. Stanford Breed, MD, FACCDATE OF BIRTH:  05/16/1940   DATE OF ADMISSION:  07/15/2008  DATE OF DISCHARGE:                              HISTORY & PHYSICAL   PRIMARY CARDIOLOGIST:  Dr. Carlena Bjornstad, MD, [facility]   CHIEF COMPLAINT:  Chest pain.   HISTORY OF PRESENT ILLNESS:  This is a 70 year old female with a known  history of CAD (S/P BMS to LAD in 2002 and mild stable CAD per last cath  in June 2505), diastolic CHF, hypertension, diabetes mellitus,  dyslipidemia, as well as fibromyalgia, GERD, and anxiety presenting with  worsening of chronic atypical chest pain.  Monday, the patient had a  study done on one of her kidney secondary to a cyst and received some  intravenous dye.  Shortly after receiving this dye, the patient noted  feeling some mild chest pain, nausea, and had 1 episode of emesis.  The  patient describes the chest pain as sharp, substernal, sometimes on the  right chest, sometimes on the left chest lasting seconds to minutes  without any associated symptoms.  Since Monday, she has had continued  symptoms with the addition of right lower jaw pain and tachy  palpitations today.  She describes the pain as 8/10 in severity at  worse.  It is nonexertional.  It is not associated with any change in  shortness of breath which she has chronically and no diaphoresis.  She  notes no aggravating or alleviating factors.   PAST MEDICAL HISTORY:  1. CAD (S/P BMS to LAD in 2002, mild stable CAD at last cath in June      2009).  2. Hypertension.  3. Dyslipidemia.  4. Diastolic CHF.  5. Fibromyalgia.  6. GERD.  7. Diabetes mellitus.  8. Anxiety.  9. Asthmatic bronchitis.  10.Depression.  11.Hypokalemia.  12.Status post cholecystectomy.  13.DJD.  14.Ulcerative colitis.   SOCIAL  HISTORY:  The patient lives in [place] with her son.  She is  retired.  She has a remote smoking history of 5-pack years, 40 years  ago.  She does not drink any alcohol, she takes no illicit drugs.  No  herbal medications.  She eats a very low sugar, low-sodium, heart-  healthy diet with no regular exercise.   FAMILY HISTORY:  Mother is alive at age 77.  She is status post 4  stents.  Father deceased at age 88, had coronary artery disease.  She  has 1 sister living without coronary artery disease, she also had  diabetes mellitus and hypertension and one brother decreased who had  diabetes mellitus.   REVIEW OF SYSTEMS:  The patient thinks she has some extra fluid on board  and subjectively has gained some weight, but has not checked, otherwise  see HPI.  All other systems reviewed and were negative.   ALLERGIES:  1. CODEINE.  2. CIPROFLOXACIN.   MEDICATIONS (HOME):  1. Flonase 50 mcg suspension  2 sprays each nostril b.i.d.  2. Allegra 180 mg 1 tab p.o. daily.  3. Singulair 10 mg 1 tab p.o. daily.  4. Robitussin tablets 1 tab p.o. b.i.d.  5. Advair Diskus 1 puff b.i.d.  6. Albuterol nebulizer q.8 h. p.r.n.  7. Plavix 75 mg p.o. daily.  8. Isosorbide dinitrate 30 mg 1/2 tablet p.o. daily.  9. Atenolol 50 mg 1 tab p.o. daily.  10.Norvasc 10 mg p.o. daily.  11.Clonidine 0.1 mg p.o. b.i.d.  12.Lasix 40 mg p.o. daily.  13.KCl 20 mEq p.o. daily.  14.Crestor 5 mg p.o. daily.  15.Metformin 500 mg p.o. b.i.d.  16.Protonix 40 mg p.o. b.i.d.  17.Questran 4 g 1 tablespoon p.r.n. for diarrhea.  18.Analpram-HC 1-2.5% cream apply p.r.n.  19.Parafon Forte DSC 500 mg 1 tablet up to 4 times a day as needed for      muscle spasm.  20.Alprazolam 0.5 mg q.8 h. p.r.n.  21.Nu-Iron 150 p.o. daily.  22.Nabumetone 750 mg p.o. b.i.d. with food.  23.Lialda 1.2 g two tabs p.o. q.a.m.   VITAL SIGNS:  Temp 98.2 degrees Fahrenheit, BP 117/77, pulse 66,  respirations 18, O2 saturation 99% on room  air.  GENERAL:  The patient is alert and oriented x3, in no apparent distress.  Able to speak in full sentences without respiratory distress, and spoke  and moved easily during exam.  HEENT:  Head is normocephalic and atraumatic.  Pupils equal, round,  reactive to light.  Extraocular muscles were intact.  Nares were patent  without discharge.  Dentition, the patient is missing several teeth.  Oropharynx without erythema or exudate.  NECK:  Supple without lymphadenopathy.  No thyromegaly.  No JVD.  No  bruits.  HEART:  Regular with audible S1, S2.  No clicks, rubs, murmurs, or  gallops.  LUNGS:  Clear to auscultation bilaterally.  ABDOMEN:  Soft, nontender, and nondistended.  The patient is morbidly  obese.  No rebound or guarding.  No hepatosplenomegaly.  No pulsations.  EXTREMITIES:  Pulses were 2+ and equal in both, upper and lower  extremities bilaterally.  No clubbing, cyanosis, or edema.  SKIN:  No rashes, lesions, or petechiae.  MUSCULOSKELETAL:  No joint deformities or effusion.  No spine or CVA  tenderness.  NEUROLOGIC:  Cranial nerves II-XII are grossly intact.  Strength is 5/5  in all extremities and axial groups.  Normal sensation throughout.  Normal cerebellar function.   RADIOLOGY:  The patient had a chest x-ray that showed venous  hypertensive changes without overt air space edema.  More focal opacity  in right lower lung which may represent infiltrate.  The patient had an  EKG that showed a normal sinus rhythm without significant ST-T wave  changes.  No significant Q waves.  Normal axis.  No evidence of  hypertrophy.  PR 200, QRS 82, and QTC 430.  No prior EKG for comparison.   LABORATORY DATA:  WBC 7.5, HGB 13.0, HCT 37.7, PLT count 440.  BNP was  38.  First set of point-of-care markers were negative.   ASSESSMENT AND PLAN:  This is a 70 year old female with a past medical  history of coronary artery disease, atypical chest pain, diabetes  mellitus, hypertension,  diastolic CHF, fibromyalgia, asthma presenting  with chest pain.  Last cath in June 2009, no obstructive CAD.  Over the  last four days, complains of sharp chest pain on multiple occasions in  the chest also with jaw pain, lasts seconds to minutes, not pleuritic  nor  exertional, not related to food.  No associated symptoms.  Rule out  MI, if enzymes negative, discharge in a.m. and follow up with Dr. Ron Parker.  Continue aspirin, statin, and beta-blocker.      Guss Bunde, PAC      Denice Bors. Stanford Breed, MD, [facility]  Electronically Signed    MS/MEDQ  D:  07/15/2008  T:  07/16/2008  Job:  [id number]

## 2010-09-15 NOTE — Discharge Summary (Signed)
   NAME:  [name], [name] NO.:  [id number]   MEDICAL RECORD NO.:  [id number]                   PATIENT TYPE:  INP   LOCATION:  2035                                 FACILITY:  [facility]   PHYSICIAN:  Carlena Bjornstad, [name] [facility]           DATE OF BIRTH:  20-Jan-1941   DATE OF ADMISSION:  01/05/2002  DATE OF DISCHARGE:  01/06/2002                                 DISCHARGE SUMMARY   DISCHARGE DIAGNOSES:  1. Chest pain.  2. Coronary artery disease, status post percutaneous coronary intervention     to the left anterior descending September 2002.  3. Hypertension.  4. Gastroesophageal reflux disease.   HOSPITAL COURSE:  The patient is a 70 year old female who was initially seen  in the office by Dr. Caryl Comes in the office on January 05, 2002 for chest  pain.  While they felt that her chest pain was very typical in nature, they  thought given her history it was prudent to go ahead and admit her to rule  out myocardial infarction and obtain a stress test the following day.   The next day, the patient was seen by Dr. Dola Argyle.  He had no further  chest pain.  He had a CT of the chest showed a small fibroid nodule and two  small lung nodules.  Radiology recommended followup CT within 3 months.  There is a question of a slight pleural effusion.   Later that day, the patient underwent adenosine-Cardiolite stress test.  Nuclear imaging revealed ejection fraction of 71% with no signs of ischemia.  As a result, the patient was felt to be stable for discharge.   DISCHARGE MEDICATIONS:  1. Furosemide 40 mg q.d.  2. Norvasc 10 mg q.d.  3. K-Dur 20 mEq q.d.  4. Protonix 40 mg q.d.  5. Toprol XL 100 mg q.d.  6. Accupril 40 mg q.d.  7. Plavix 75 mg q.d.  8. Relafen 500 mg b.i.d.   LABORATORY VALUES:  D-dimer 0.31.  Hemoglobin 12.2.  BUN 12, creatinine 0.5.  TSH 0.8.   Electrocardiogram showed sinus rhythm at 89.  There is also noted to be some  left atrial  enlargement.  PR interval 168, QRS 80, QTC 452, axis 29.    DISCHARGE INSTRUCTIONS:  1. The patient is to return to a normal level of activity.  2. She is to follow a low fat diet.  3. She is to follow up with Dr. Ron Parker in approximately 2-4 weeks, the office     will call.     Tad Moore, P.A. [facility]                  Carlena Bjornstad, [name] [facility]    CKM/MEDQ  D:  01/06/2002  T:  01/07/2002  Job:  [id number]

## 2010-09-15 NOTE — Cardiovascular Report (Signed)
[facility]  Patient:    [name], [name] Visit Number: [id number] MRN: [id number]          Service Type: MED Location: 2000 2025 01 Attending Physician:  Lorenza Evangelist Dictated by:   Christy Sartorius, [name] [facility] Proc. Date: 02/10/01 Admit Date:  02/07/2001   CC:         Nicki Reaper M. Lenna Gilford, [name] [facility]  Carlena Bjornstad, [name] [facility]   Cardiac Catheterization  PROCEDURES PERFORMED: 1. Left heart catheterization. 2. Selective coronary angiography.  DIAGNOSIS: 1. Coronary artery disease. 2. Patent left anterior descending artery stent.  HISTORY:  [name] is a 70 year old black female with a history of coronary artery disease, who had undergone percutaneous intervention to the LAD for an occluded vessel on January 31, 2001, by Dr. Olevia Perches.  The patient has had recurrence of chest discomfort and underwent stress imaging study on February 07, 2001.  This showed mild breast attenuation in the anterolateral wall with no ischemia and she has well preserved LV function.  She presents now for relook angiography due to persistence of pain.  TECHNIQUE:  Informed consent was obtained.  The patient was brought to the catheterization laboratory and a 6-French sheath was placed in the right femoral artery.  Left heart catheterization and selective angiography were then performed in the usual fashion using preformed 6-French Judkins catheters.  A left ventriculogram was not performed.  At the termination of the case, the catheters and sheath were removed and manual pressure applied until adequate hemostasis was achieved.  The patient tolerated the procedure well and was transferred to the floor in stable condition.  FINDINGS: 1. Left main trunk:  This is a large caliber vessel that is angiographically    normal. 2. Left anterior descending:  This begins as a large caliber vessel and    provides two diagonal branches as it wraps around the apex.  The LAD has  evidence of a stent in the mid section after a large first septal    perforator and medium sized first diagonal branch.  The stent is widely    patent.  There is then moderate disease of 30-40% in the mid LAD following    the stent.  The distal LAD has moderate diffuse disease with narrowings of    50-60%.  There is a focal narrowing of 70% prior to the apex.  The diagonal    branches have mild irregularities. 3. Left circumflex artery:  This is a large caliber vessel that consists of    four marginal branches.  The left circumflex system has narrowings of    20-30%. 4. Right coronary artery:  The right coronary artery is dominant.  This is a    medium caliber vessel that provides a posterior descending artery and a    posteroventricular branch in the terminal segment.  There are mild    irregularities of 20-30% in the right coronary artery. 5. Left ventricular pressure is 130/5.  Aortic pressure is 130/80.  LV EDP 10.  ASSESSMENT AND PLAN:  [name] is a 70 year old black female with coronary artery disease and persistent chest discomfort.  The patient appears well revascularized, status post intervention to the mid left anterior descending artery.  She has moderate disease in the distal left anterior descending artery and apex that subtends the apical wall of the left ventricle. Continued medical therapy should be pursued.  Should the patient have intractable pain or evidence of ischemia in the apex, percutaneous  intervention may be considered.  Dictated by:   Christy Sartorius, [name] [place]  Attending Physician:  Lorenza Evangelist DD:  02/10/01 TD:  02/10/01 Job: [id number]

## 2010-09-15 NOTE — Discharge Summary (Signed)
NAME:  [name], [name]                         ACCOUNT NO.:  [id number]   MEDICAL RECORD NO.:  [id number]                   PATIENT TYPE:  INP   LOCATION:  5501                                 FACILITY:  [facility]   PHYSICIAN:  Deborra Medina. Lenna Gilford, [name] [facility]            DATE OF BIRTH:  November 21, 1940   DATE OF ADMISSION:  08/01/2003  DATE OF DISCHARGE:  08/09/2003                                 DISCHARGE SUMMARY   HISTORY OF PRESENT ILLNESS:  The patient is a 70 year old female whom I  follow for general medical purposes.  She presented to the emergency room  with a history of refractory asthmatic bronchitis that had been going on for  several weeks.  She had been treated as an outpatient and at the urgent  medical care center and states that she was no better and getting worse with  chest congestion, cough and yellow sputum production, low-grade fever, chest  discomfort and increasing shortness of breath.  She was very histrionic in  the emergency room and agitated and anxious and evaluation by Dr. Peter Minium of  ER staff felt that she could not go home and that admission was necessary  for treatment of his refractory attack.  She had a history of asthmatic  bronchitis and had been controlled on Advair, Flonase, Allegra, guaifenesin  and Tussionex at home.  She had had several rounds of antibiotics and a  brief course of Medrol as an outpatient.  She had a previous CT of the chest  that showed 2 tiny 3- and 50-mm nodules that are being followed.  She  indicated she was not capable of continuing at home and admission was  demanded by the patient and her family for intravenous and inhaled therapy.   PAST MEDICAL HISTORY:  1. Coronary artery disease -- she has a history of coronary artery disease     with last catheterization in 2002 with a PTCA and stent to the LAD.  She     had three-vessel disease with mainly LAD stenoses and 20% to 30% lesions     in the circumflex and RCA.  Chest x-rays have shown  cardiomegaly and CT     has shown some coronary calcification and aortic calcification as well.     At her initial workup, she had an abnormal Cardiolite that led to the     cath and the subsequent PTCA.  She has a history of chronic chest     discomfort and fibromyalgia and several admissions for chest pain that     turned out to be non-cardiac in origin.  Her last Cardiolite was in     January 2005 and it was felt to be nonischemic with an ejection fraction     of 60%.  2. Hypertension -- she has a history of hypertension controlled on     medications with LVH on 2-D echo previously.  3. Hyperlipidemia -- she has  a history of hypercholesterolemia and has been     controlled on Lipitor 10 mg a day.  Last cholesterol was 165 with an HDL     of 51 and an LDL of 99 in January of this year.  Her triglycerides were     73.  4. Gastroesophageal reflux disease and dyspepsia with a normal endoscopy in     1995.  She has been on Protonix regularly.  Is followed by Dr. [name] T.     Fuller Plan.  5. History of diverticulosis, irritable bowel syndrome and hemorrhoids.  Her     last colonoscopy in 2002 showed diverticulosis and a few submucosal     lesions compatible with lipomas.  She also had a few small hemorrhoids     which were non-bleeding.  6. Status post laparoscopic cholecystectomy in 2000 for stones by Dr.     Haywood Lasso.  7. History of thyroid nodule noted on CT scan.  Her thyroid function has     been normal.  8. History of degenerative arthritis and fibromyalgia -- she has known     degenerative disk disease in C4-5 on previous x-rays in 1998.  She has     had an evaluation by Dr. Lindaann Slough for rheumatology and Dr.     Gaynelle Arabian for orthopedics.  She has had some low back pain and a     series of epidural steroid shots under the direction of Dr. Wynelle Link.  Dr.     Charlestine Night agreed with the diagnosis of fibromyalgia.  She states that she     is disabled because of  arthritis for the last 10 years.  9. History of anxiety -- she has a history of severe anxiety and takes     p.r.n. alprazolam.  10.      History of headaches with previous evaluation by Dr. Alyson Locket. Love.  11.      Status post hysterectomy and benign breast biopsy in 1997.  12.      History of left renal cyst on previous CT.   PHYSICAL EXAMINATION:  Physical examination at the time of admission  revealed a 70 year old lady, moderately anxious, but in no acute distress.  VITAL SIGNS:  Blood pressure 110/58 and pulse 64 per minute and regular,  respirations 20 per minute and not labored, O2 saturation 97% on 2 L,  temperature 98.2.  HEENT exam unremarkable except slightly dry mucous  membranes.  NECK:  Exam shows no jugular venous distention, no carotid  bruits, palpable thyroid without nodules, no lymphadenopathy.  CHEST:  Exam  revealed decreased breath sounds with a few scattered rhonchi bilaterally,  no signs of consolidation.  CARDIAC:  Exam revealed a regular rhythm, grade  1/6 systolic ejection murmur at left sternal border without rubs or gallops  heard.  ABDOMEN:  The abdomen is soft with minimal epigastric tenderness on  palpation, no evidence of organomegaly or masses.  RECTAL:  Rectal was  deferred.  EXTREMITIES:  Extremities showed no cyanosis, clubbing or edema.  NEUROLOGIC:  Exam was intact without focal abnormalities detected.  She is  quite anxious, however.   LABORATORY DATA:  EKG showed normal sinus rhythm, question of left atrial  enlargement and nonspecific ST-T wave changes.  There were no arrhythmias  noted.   Chest x-ray showed some cardiomegaly and mild vascular congestion.   CT of the chest was performed on August 02, 2003, revealing some ill-defined  densities in the lung bases compatible  with some atelectasis; dense  calcification noted at the LAD coronary artery; two 3-mm nodules unchanged from the old scan; 3-cm cyst at the upper pole of the left kidney  which is  somewhat smaller than previous scan.   Arterial blood gas is pH 7.50, PCO2 38.7, PO2 86 on room air.  Hemoglobin  13.8, hematocrit 41.1, white count 17,200 with 78% segs.  Sed rate 11.  Pro  time 13.6, INR 1.1, PTT 25 seconds.  Sodium 137, potassium 3.8, chloride  103, CO2 26, BUN 12, creatinine 0.6, blood sugar 179, calcium 9.4, total  protein 6.4, albumin 3.2, AST 21, ALT 21, alkaline phosphatase 42, total  bilirubin 0.6.  Beta natriuretic peptide less than 30.  TSH 1.37.   HOSPITAL COURSE:  The patient was admitted with refractory asthmatic  bronchitis.  She was placed on IV Solu-Medrol and IV Avelox for broad-  spectrum coverage and given IV fluids, nebulizer treatments, low-flow  oxygen, guaifenesin, and Tussionex.  Her cardiac and blood pressure  medicines were continued.  She had a slow response to this therapy and  flutter valve treatments, incentive spirometry and the best therapy had to  be initiated.  Because of the refractory nature of her wheezing and  congestion, a CT scan was obtained with results above.  There were no  endobronchial lesions identified.  She had some mild atelectasis which  gradually cleared with continued therapy.  She was quite anxious during the  hospital course and quite histrionic; Xanax was increased and Flexeril added  at bedtime because of her history of fibromyalgia.  Because of the IV Solu-  Medrol, her sugars increased and she was covered on a sliding scale.  As the  Solu-Medrol was discontinued in favor of oral Medrol and IV fluid was  discontinued, her sugars improved towards normal.  She was eventually  started on Metformin 500 mg p.o. b.i.d. to aid in glucose control.   The patient is going to be discharged on home nebulizer with triple combo  therapy and visiting nurses to help in her coping at home.  She will use the  incentive spirometer and the flutter valve treatments every hours or 2 and  drink plenty of fluids and use the  guaifenesin to aid in mucociliary  clearance.  She received a pneumonia shot while she was here in the  hospital.  She was felt to be MHB and ready for discharge on August 09, 2003.   MEDICATIONS AT DISCHARGE:  1. Medrol 8 mg p.o. q.a.m.  2. Nebulizer with triple combo therapy (albuterol 2.5, Atrovent 0.5,     budesonide 0.25) using nebulizer 4 times daily.  3. Aquatab DM 1200 mg p.o. b.i.d. with plenty of fluids.  4. Singulair 10 mg p.o. daily.  5. Alprazolam 0.5 mg p.o. 4 times daily.  6. Flexeril 10 mg 2 tablets p.o. nightly.  7. Flonase 2 sprays in each nostril at bedtime.  8. Allegra 60 mg 1 or 2 tablets p.o. q.a.m.  9. Lasix 40 mg p.o. daily.  10.      K-Dur 20 mEq p.o. daily.  11.      Norvasc 10 mg p.o. daily.  12.      Toprol-XL 100 mg 1/2 tablet p.o. daily.  13.      Clonidine 0.1 mg p.o. daily.  14.      Plavix 75 mg p.o. daily.  15.      Lipitor 10 mg p.o. daily.  16.  Protonix 40 mg p.o. daily. 17.      Darvocet-N 100 1 every 6 hours as needed for pain.  18.      Relafen 750 mg p.o. b.i.d. as needed for arthritic pain.  19.      Metformin 500 mg p.o. b.i.d.  20.      The patient was instructed to stop her previous Accupril for the     time-being.   DISPOSITION AND FOLLOWUP:  The patient is being discharged with maximum home  health support and will follow up with me in the office on Friday, August 13, 2003, at 1 p.m.   PROBLEM LIST:  1. Asthmatic bronchitis with refractory episode -- improved on maximum in-     hospital therapy.  2. Diabetes mellitus -- with steroid exacerbation, started on Metformin.  3. Coronary artery disease.  4. Hypertension.  5. Hyperlipidemia.  6. Dyspepsia and gastroesophageal reflux disease.  7. Diverticulosis, irritable bowel syndrome, lipoma and hemorrhoids.  8. Status post laparoscopic cholecystectomy in 2000.  9. History of thyroid nodule.  10.      Degenerative arthritis and fibromyalgia.  11.      Anxiety.  12.      History  of migraine headaches.  13.      Status post hysterectomy and benign breast biopsy in 1997.                                                Deborra Medina. Lenna Gilford, [name] [facility]    SMN/MEDQ  D:  08/08/2003  T:  08/09/2003  Job:  [id number]

## 2010-09-15 NOTE — Discharge Summary (Signed)
[place] [place]  Patient:    [place]AMA[place], INTHAVONG Visit Number: [id number] MRN: [id number]          Service Type: Attending:  Vanna Scotland. Olevia Perches, [name] [facility] Dictated by:   Mannie Stabile, P.A. Adm. Date:  02/07/01 Disc. Date: 02/11/01                             Discharge Summary  PROCEDURE:  Coronary angiogram, February 10, 2001.  REASON FOR ADMISSION:  The patient is a 70 year old female, status post recent stenting of a totally occluded LAD, October 4, who presented to the office with complaints of recurrent chest discomfort relieved with nitroglycerin. The patient was directly admitted for further evaluation and diagnostic testing. Please refer to dictated admission note.  LABORATORY DATA:  WBC 15.1, hemoglobin 12.1, hematocrit 35.8, platelets 539 on admission.  Sodium 138, potassium 3.5, glucose 103, BUN 10, creatinine 0.5. Cardiac enzymes; CPK/MB negative x 2, troponin I 0.04, less than 0.01.  TSH 0.64.  Urinalysis negative.  Admission chest x-ray; no acute disease.  HOSPITAL COURSE:  The patient ruled out for MI with negative serial cardiac enzymes.  Initial plan was to proceed with a noninvasive workup.  However, Persantine Cardiolite revealed question of breast attenuation versus mild anterior ischemia with normal LV function.  Given these results, and the patients recent stent and recurrent chest pain in hospital, the plan was to proceed with relook coronary angiogram.  Cardiac catheterization, performed October 14, by Dr. Lyndel Safe (see catheterization report for full details), revealed widely patent stent of the proximal LAD with mild/moderate disease distally and no significant obstructions of the CFX and RCA.  LV gram was deferred.  The patient was cleared for discharge the following morning in hemodynamically stable condition.  No medical adjustments made this admission.  Of note, the patient had mild leukocytosis on admission, but had no  obvious source of infection; she remained afebrile during her brief stay and a urinalysis was negative.  DISCHARGE MEDICATIONS:  1. Lasix 40 mg q.d.  2. Norvasc 10 mg q.d.  3. Duratuss as directed.  4. Beconase nasal spray twice daily as directed.  5. Eye drops as directed.  6. K-Dur 20 mEq q.d.  7. Protonix 40 mg q.d.  8. Toprol XL 100 mg q.d.  9. Accupril 40 mg q.d. 10. Plavix 75 mg q.d. (Indefinitely) 11. Foltx one tablet daily. 12. Relafen 500 mg b.i.d. 13. Nitrostat as directed.  DISCHARGE INSTRUCTIONS:  No heavy lifting/driving x 2 days; maintain a low fat/cholesterol diet.  Call the office if there is any bleeding/swelling of the groin.  The patient is scheduled to follow up with Carlena Bjornstad, [name] [facility] on Monday, November 4, at 2 p.m.  DISCHARGE DIAGNOSES: 1. Noncardiac chest pain.    a. Negative serial cardiac enzymes.    b. Widely patent left anterior descending stent; coronary angiogram on       February 10, 2001.    c. Status post stent 100% proximal left anterior descending January 31, 2001.    d. Normal left ventricular function. 2. Aspirin allergy.    a. Plavix indefinitely. 3. Fibromyalgia. 4. History of hypertension. 5. Mild leukocytosis. Dictated by:   Mannie Stabile, P.A. Attending:  Vanna Scotland Olevia Perches, [name] [facility] DD:  01/27/01 TD:  02/11/01 Job: [id number]

## 2010-09-15 NOTE — Assessment & Plan Note (Signed)
[place] OFFICE NOTE   NAME:[name], [name]                      MRN:          [id number]  DATE:08/26/2006                            DOB:          10/16/1940    [name] returns for follow-up of diarrhea, predominant irritable  bowel syndrome and hemorrhoids.  Her symptoms are under much better  control using Robinul, cholestyramine, Imodium, Canasa suppositories,  and Analpram cream.  She states she only notes very small amounts of  hematochezia on rare occasions.  Her diarrhea will return if she  discontinues medications, but as long as she remains on the above  medications, her diarrhea appears to be well controlled.  She notes  occasional episodes of coughing and strangling when she swallows.  These symptoms may happen every month or two.  She does not have  symptoms typical for esophageal dysphagia.   CURRENT MEDICATIONS:  Listed on the chart and have been reviewed.   ALLERGIES:  CAFFEINE, CODEINE, CIPRO.   PHYSICAL EXAMINATION:  GENERAL:  No acute distress.  VITAL SIGNS:  Weight 194.8, blood pressure 114/68, pulse 68 and regular.  CHEST:  Clear to auscultation bilaterally.  HEART:  Regular rate and rhythm without murmurs.  ABDOMEN:  Soft and nontender with normal active bowel sounds.   ASSESSMENT:  1. Diarrhea predominant, irritable bowel syndrome.  Continue current      medications.  Return office visit in 1 year.  2. Internal and external hemorrhoids.  Symptoms under very good      control.  Continue Analpram cream b.i.d. as needed and Canasa 1000      mg suppositories daily as needed.  3. Intermittent choking.  The patient is to monitor her symptoms.  If      they progress in frequency, we will plan to proceed with a modified      barium swallow and a barium esophagram for further evaluation.     Pricilla Riffle. Fuller Plan, MD, [facility]  Electronically Signed    MTS/MedQ  DD: 08/26/2006  DT:  08/26/2006  Job #: [id number]   cc:   Deborra Medina. Lenna Gilford, MD

## 2010-09-15 NOTE — Cardiovascular Report (Signed)
NAME:  [name], [name] NO.:  [id number]   MEDICAL RECORD NO.:  [id number]                   PATIENT TYPE:  INP   LOCATION:  3739                                 FACILITY:  [facility]   PHYSICIAN:  Junious Silk, [name] [facility]         DATE OF BIRTH:  1940-05-29   DATE OF PROCEDURE:  11/25/2003  DATE OF DISCHARGE:                              CARDIAC CATHETERIZATION   PROCEDURES PERFORMED:  1. Left heart catheterization.  2. Left coronary angiography.  3. Left ventriculography.   INDICATIONS:  [name] is a 70 year old woman with a history of coronary  artery disease and prior stent to the left anterior descending artery.  She  was admitted with symptoms of substernal chest pain.  She was referred for  cardiac catheterization to rule out recurrent coronary artery disease.   PROCEDURAL NOTE:  A 6-French sheath was placed in the right femoral artery.  Coronary angiography was performed with standard Judkins 6-French catheters.  Left ventriculography was performed with an angled pigtail catheter.  Contrast was Omnipaque.  There were no complications.   RESULTS:   HEMODYNAMICS:  1. Left ventricular pressure:  140/22.  2. Aortic pressure:  140/84.  3. There is no aortic valve gradient.   LEFT VENTRICULOGRAM:  Wall motion is normal.  Ejection fraction is estimated  at greater than or equal to 60%.  There is trace mitral regurgitation.   CORONARY ARTERIOGRAPHY (CO-DOMINANT):  The left main is normal.   The left anterior descending artery has a stent in the proximal portion of  the mid-vessel.  This stent is widely patent with less than 10% stenosis  within the stent.  Beyond this, in the mid LAD, there is a diffuse 40%  stenosis.  The distal LAD has a 30% stenosis.  In the apical portion of the  LAD there is a 70% stenosis.  The LAD gives rise to small first and second  diagonal branches and a normal size third diagonal branch.  The third  diagonal  branch has a 40% stenosis at it's ostium.   The left circumflex is a large dominant vessel.  There are minor luminal  irregularities in the proximal vessel and a 30% stenosis in the distal  vessel.  Circumflex gives rise to two large obtuse marginal branches and two  small posterolateral branches.   The right coronary artery is a co-dominant vessel.  There is a tubular 60%  stenosis in a proximal vessel.  The right coronary artery is otherwise  normal, giving rise to a normal size posterior descending artery.   IMPRESSIONS:  1. Normal left ventricular systolic function.  2. Patent stent in the left anterior descending artery with residual     moderate, but non-obstructive, coronary artery disease.   PLAN:  The patient will be managed medically.  It appears her chest pain is  non-cardiac in etiology.  Junious Silk, [name] [facility]    MWP/MEDQ  D:  11/25/2003  T:  11/25/2003  Job:  [id number]   cc:   Deborra Medina. Lenna Gilford, [name] [facility]   Dola Argyle, [name]   [facility]ter[facility]

## 2010-09-15 NOTE — Assessment & Plan Note (Signed)
[place] OFFICE NOTE   NAME:[name], [name]                      MRN:          [id number]  DATE:04/16/2006                            DOB:          May 11, 1940    HISTORY:  [name] is seen for cardiology followup.  Fortunately, she  is not having any major problems.  She does have some rare pain from  time to time.  We know that her last catheterization done in February  2007, showed that her ejection fraction was good.  Medical therapy is  recommended.  There is a question of a jailed diagonal, although there  was good flow.  She is going about her full activities.   ALLERGIES:  CODEINE.   MEDICATIONS:  1. Flonase.  2. Singulair.  3. Allegra.  4. Advair.  5. Metformin.  6. Lasix.  7. Lipitor.  8. Toprol.  9. Norvasc.  10.Clonidine.  11.Plavix.  12.Isosorbide.  13.Relafen.  14.Cholestyramine powder.  15.Protonix.   OTHER MEDICAL PROBLEMS:  See the list below.   REVIEW OF SYSTEMS:  She is doing well.  Otherwise the review of systems  is negative.   PHYSICAL EXAMINATION:  VITAL SIGNS:  Blood pressure today 123/85, pulse  77.  GENERAL:  The patient is oriented to person, time and place.  Her affect  is normal.  HEENT:  No xanthelasma.  She has normal extraocular motion.  NECK:  There are no carotid bruits.  There is no jugular venous  distention.  LUNGS:  Clear.  Respiratory effect is not labored.  HEART:  An S1 and S2.  There are no clicks or significant murmurs.  ABDOMEN:  Soft.  No masses or bruits.  EXTREMITIES:  There is no peripheral edema.   PROBLEMS/PAST MEDICAL/SURGICAL HISTORY:  1. Coronary artery disease:  This is stable.  2. Hypertension.  3. Hyperlipidemia.  4. Diabetes.  5. Irritable bowel.  6. Disability due to fibromyalgia.  7. Gastroesophageal reflux disease.  8. Asthmatic bronchitis.  9. Degenerative joint disease.  10.History of hysterectomy.  11.History of some  anxiety.  12.History of a thyroid nodule.  13.Some volume overload, and this is now stable.  14.Status post cholecystectomy.  15.Status post tubal ligation.   PLAN:  The patient is stable and no further workup is needed at this  time.     [name] Bjornstad, MD, [facility]     JDK/MedQ  DD: 04/16/2006  DT: 04/16/2006  Job #: [id number]

## 2010-09-15 NOTE — Discharge Summary (Signed)
NAME:  [name], [name]                         ACCOUNT NO.:  [id number]   MEDICAL RECORD NO.:  [id number]                   PATIENT TYPE:  INP   LOCATION:  6213                                 FACILITY:  [facility]   PHYSICIAN:  Jenkins Rouge, [name]                  DATE OF BIRTH:  05-02-1940   DATE OF ADMISSION:  11/24/2003  DATE OF DISCHARGE:  11/26/2003                                 DISCHARGE SUMMARY   PROCEDURES:  1. Cardiac catheterization.  2. Coronary arteriogram.  3. Left ventriculogram.   HOSPITAL COURSE:  Ms. Dombek is a 70 year old female with known coronary  artery disease.  She has a history of percutaneous intervention with a stent  to the proximal LAD in October 2002.  She was admitted in January 2005, for  chest pain and ruled out for a MI and had a non-ischemic Cardiolite.  She  was placed on a statin.  She has been followed in the office since that  time.  She came to the hospital on November 24, 2003, because she had been  having pain.  The pain started at approximately 1:30 on the day of  admission.  She was admitted for further evaluation and treatment.   Her enzymes were negative for MI, but it was felt that the cardiac  catheterization was indicated to further define her anatomy.  The cardiac  catheterization showed non-obstructive coronary artery disease in the RCA,  circumflex, LAD, and branches, but no significant in-stent restenosis and  normal EF with trace MR.  Dr. Vicenta Aly felt that she had a patent stent  with residual non-obstructive coronary artery disease and medical therapy  was the best option.   Ms. Lohmann had some pain post-cath and was given Nubain for this, but she  had some nausea and vomiting with this, so this medication was discontinued.  She was held overnight.  The next day, her groin was stable and she had no  further episodes of chest pain.  She was considered stable for discharge on  November 26, 2003, with outpatient followup arranged.   CONDITION ON DISCHARGE:  Improved.   DISCHARGE DIAGNOSES:  1. Chest pain, no critical coronary artery disease by catheterization.  2. Status post stent to the left anterior descending in October 2002.  3. Status post Cardiolite in January 2005, that showed no ischemia and an     ejection fraction of 60%.  4. Hypertension.  5. Diabetes.  6. Gastroesophageal reflux disease.  7. Diverticulosis with irritable bowel syndrome.  8. History of thyroid nodule.  9. Status post cholecystectomy.  10.      Degenerative joint disease.  11.      Anxiety.  12.      Status post hysterectomy and breast biopsy.  13.      Intolerance or allergy to ASPIRIN, CODEINE, CAFFEINE, AND NOW     NUBAIN.   ACTIVITY:  No  strenuous activity for two days.   WOUND CARE:  She is to call the office for problems with the cath site.   FOLLOWUP:  1. She has an appointment with the P.A. for Dr. Ron Parker on December 10, 2003.  2. She is to follow up with Dr. Lenna Gilford as scheduled.   DISCHARGE MEDICATIONS:  1. Lasix 40 mg p.o. daily.  2. Norvasc 10 mg p.o. daily.  3. K-Dur 20 mEq daily.  4. Protonix 40 mg daily.  5. Toprol XL 100 mg daily.  6. Plavix 75 mg daily.  7. Relafen 750 mg b.i.d.  8. Catapres 0.1 mg daily.  9. Allegra 60 mg p.o. b.i.d.  10.      Accupril 40 mg p.o. daily.  11.      Lipitor 10 mg daily q.h.s.  12.      Flexeril 10 mg p.r.n.  13.      Xanax 0.5 mg p.r.n.  14.      Tussionex p.r.n.  15.      Please change the potassium to 20 mEq 1-1/2 tablets p.o. daily.      Rosaria Ferries, P.A. [facility]                  Jenkins Rouge, [name]    RB/MEDQ  D:  11/26/2003  T:  11/26/2003  Job:  [id number]   cc:   Deborra Medina. Lenna Gilford, [name] [facility]   Dola Argyle, [name]   Docia Chuck. Henrene Pastor, [name] [facility]

## 2010-09-15 NOTE — Discharge Summary (Signed)
NAMEMAY, MANRIQUE NO.:  [id number]   MEDICAL RECORD NO.:  [id number]          PATIENT TYPE:  INP   LOCATION:  3705                         FACILITY:  [facility]   PHYSICIAN:  Dola Argyle, [name]     DATE OF BIRTH:  [birth year]   DATE OF ADMISSION:  06/12/2005  DATE OF DISCHARGE:  06/14/2005                                 DISCHARGE SUMMARY   PRIMARY CARE PHYSICIAN:  Deborra Medina. Lenna Gilford, [name] [facility]   CARDIOLOGIST:  Carlena Bjornstad, MD   DISCHARGE DIAGNOSES:  1.  Coronary artery disease/unstable angina, status post cardiac      catheterization.  2.  Angina from small diagonal vessel stasis recommending medical therapy.      Will increase Imdur from 30 to 60 mg daily.  3.  Fibromyalgia also contributing to chest pain.   PAST MEDICAL HISTORY:  1.  Includes coronary artery disease, status post cardiac catheterization in      2005, normal LVF, widely patent stent, Cardiolite in January 2005,      normal Cardiolite.  EF of 60%.  2.  Fibromyalgia.  3.  Disability secondary to #2.  4.  GERD.  5.  Irritable bowel syndrome.  6.  Asthmatic bronchitis.  7.  Hypertension.  8.  Degenerative joint disease.  9.  Status post hysterectomy.  10. Hypercholesterolemia.  11. Non-insulin-dependent diabetes.   HOSPITAL COURSE:  Ms. Thomason is a 70 year old female followed by Dr. Ron Parker  with a known history of coronary artery disease.  Last cardiac  catheterization in 2005 revealed a patent stent with nonobstructed residual  CAD and a normal EF.  The patient presented to [facility]  this admission complaining of recurrent chest pain since prior Sunday. The  patient had taken 3 sublingual nitroglycerin tablets with resolution of  chest discomfort, however, chest pain recurred.  Initial EKG without acute  changes. Cardiac enzymes negative.  However, in the patient setting of known  CAD/stent intervention, proceed with cardiac catheterization with  reevaluation.  The patient  to the cath lab on June 13, 2005 by Dr.  Albertine Patricia; results as stated above.  The patient tolerated the procedure  without complications.  Dr. Ron Parker in to see patient on the day post  catheterization.  Vital signs stable.   No further episodes of chest discomfort with the patient being discharged  home with instructions to increase her Imdur to 60 mg daily.  The patient  has been given a prescription for this.  Further medications as previous  including  metformin, Lasix, Lipitor, Toprol, Norvasc, clonidine, Plavix, potassium and  Protonix.  The patient has a followup appointment with Levon Hedger on  February 23 for a post catheterization exam.   DURATION OF DISCHARGE ENCOUNTER:  25 minutes.      Rosanne Sack, NP    ______________________________  Dola Argyle, [name]    MB/MEDQ  D:  06/14/2005  T:  06/14/2005  Job:  [id number]   cc:   Dola Argyle, [name]  [address] [place] [id number]   Scott M.  Lenna Gilford, [name] [facility]  [address]  [place] [address]

## 2010-09-15 NOTE — H&P (Signed)
NAME:  [name], [name] NO.:  [id number]   MEDICAL RECORD NO.:  [id number]                   PATIENT TYPE:  INP   LOCATION:  1824                                 FACILITY:  [facility]   PHYSICIAN:  Jenkins Rouge, [name]                  DATE OF BIRTH:  1941/03/17   DATE OF ADMISSION:  11/24/2003  DATE OF DISCHARGE:                                HISTORY & PHYSICAL   PRIMARY CARDIOLOGIST:  Dola Argyle, [name]   PRIMARY CARE PHYSICIAN:  Deborra Medina. Lenna Gilford, [name]   SPECIALISTS:  Dr. Henrene Pastor.   PRESENTING CIRCUMSTANCE:  I've been having pain all over: on and off.   HISTORY OF PRESENT ILLNESS:  [name] is a 70 year old female with prior  history of stent (bare metal) to the mid-LAD placed January 31, 2001. She had  persistent pain and was submitted for precatheterization February 10, 2001.  The stent was patent, and the patient was well revascularized.   Patient has multifocal pain sites secondary to a diagnosis of ongoing  fibromyalgia.   Today, at 1:30 p.m. after lunch, the patient had just used the bathroom, was  washing her hands when she had onset of pain in the right breast, moved  across the chest and into the throat where it felt like a knot. Then the  pain moved up to her back and into her gums, which caused them to ache very  badly, then moved into the right neck.  Patient has had no concomitant  symptoms of dyspnea, nausea or diaphoresis.  The patient felt hot. She took  2 nitroglycerin without any relief in the pain. Since the pain had  persisted, she called 911.   The pain has now damped off some without further intervention from the  emergency medical services.  Now the pain just comes on and off. At the time  of the exam, she is not having significant pain.   ALLERGIES:  ASPIRIN MAKES HER BLIND SICK, CODEINE MAKES HER SICK AND  NAUSEATED, CAFFEINE MAKES HER THROW UP.   MEDICATIONS:  1. Lasix 40 mg  daily.  2. Norvasc 10 mg daily.  3. K-Dur 20  mEq daily.  4. Protonix 40 mg daily.  5. Toprol  XL 100 mg daily.  6. Plavix 75 mg daily.  7. Relafen 750 mg twice daily.  8. Advair metered-dose inhaler twice daily.  9. Clonidine 0.1 mg daily.  10.      Allegra 60 mg twice daily.  11.      Accupril 40 mg daily.  12.      Lipitor 10 mg daily at bedtime.  13.      Flexeril 10 mg 3 times a day as needed.  14.      Xanax 0.5 mg every 8 hours as needed.  15.      Tussionex as needed.   PAST MEDICAL HISTORY:  1. Coronary  artery disease status post stent to the LAD October 2002.  Last     catheterization was in 2002.  Study showed that the left main was without     disease, the left anterior descending artery had a patent stent in the     mid-point. There was a focal 70% stenosis prior to the apex, left     circumflex 20-30% disease, right coronary artery dominant with 20-30%     diffuse disease. At that time, PCI was recommended if the patient had     recurrent evidence of apical ischemia.  2. Cardiolite study January 2005, negative for ischemia, ejection fraction     60%.  3. Fibromyalgia.  4. Multiple admissions for chest pain, noncardiac in nature.  5. Hypertension.  6. GERD.  7. Diabetes.  8. Diverticulosis with irritable bowel syndrome.  9. Status post laparoscopic cholecystectomy in 2000.  10.      History of thyroid nodule.  11.      Degenerative joint disease.  12.      Anxiety.  13.      Status post tubal ligation, hysterectomy, breast biopsy which was     benign.   SOCIAL HISTORY:  Patient lives in [place]. She is retired, she has done  all kinds of work, she says.  No tobacco history, quit a long time ago when  cigarettes cost 30 cents a pack. No alcoholic beverages, no recreational  drugs.   FAMILY HISTORY:  Mother is still living at 15, she has Parkinson's.  Father  had history of 3 myocardial infarction and multiple strokes, died at age 53.  One brother deceased, he had diabetes and black lung.  One sister  living, no  coronary disease or diabetes.   REVIEW OF SYSTEMS:  Patient is having no fevers, chills, weight change or  adenopathy. HEENT:  No epistaxis, voice changes, vertigo, photophobia.  INTEGUMENT:  No rashes, lesions, nonhealing ulcers.  CARDIOPULMONARY:  The  patient has chest pain as described above. She also has pain which is tender  to palpation in the chest and apparently, the pain can be made worse if she  takes a deep breath in and out.  She is short of breath, but that is a  chronic, off and on syndrome.  She has edema. She claims to have  palpitations, no syncope or presyncope, no history of claudication.  UROGENITAL:  Urinary frequency and urgency, nocturia off and on.  NEUROPSYCHIATRIC:  Anxiety.  MUSCULOSKELETAL:  Arthralgias secondary and  inclusive with her fibromyalgia.  GASTROINTESTINAL:  No history of GI  bleeding, no melena, no bright red blood per rectum, no hematemesis. She  does have GERD symptoms.  ENDOCRINE: Thyroid status is unknown. Patient says  she has never been treated for thyroid disease. All other systems negative.   PHYSICAL EXAMINATION:  VITAL SIGNS:  Temperature 98.1, pulse 80,  respirations 28 and even, blood pressure 132/75, oxygen saturation 97% on  room air.  HEENT:  Eyes: Pupils are equal, round and reactive to light, extraocular  movements intact.  Mouth:  Mucous membranes pink, moist without lesion or  erythema. The patient wears upper partial dentures.  NECK:  Supple, no carotid bruits auscultated. No jugulovenous distention, no  cervical lymphadenopathy.  CHEST: Tender to palpation centrally.  LUNGS:  Clear to auscultation and percussion bilaterally.  CARDIOVASCULAR:  Regular rate and rhythm. S1/S2 clearly auscultated. No  murmurs, rubs or gallops.  INTEGUMEN: No rashes or lesions.  ABDOMEN:  Soft, nondistended, bowel  sounds are present.  No rebound or guarding, no hepatosplenomegaly.  Abdominal aorta is non-expansile.  UROGENITAL:   Rectal exam deferred.  EXTREMITIES:  Radial pulses 4/4 bilaterally, 4/4 dorsalis pedis pulses  bilaterally.  No clubbing, cyanosis or edema.  MUSCULOSKELETAL:  No joint deformity or effusions, no costal vertebral angle  tenderness.  NEUROLOGIC:  No focal neurologic deficits.   LABORATORY DATA:  Chest x-ray has been ordered but is pending.  Electrocardiogram shows a rate of 76, sinus rhythm, no Q waves, no ST  changes, no hypertrophy, axis +30 degrees, PR interval 183, QRS 92, QTC 436.  CBC and BMET are pending. First set of cardiac enzymes:  Myoglobin 33.2, CK-  MB less than 1, troponin I less than 0.05.   IMPRESSION:  1. Chest pain possibly related to reocclusion of stenting, marginally     unstable angina.  2. For other impressions, please consult past medical history.   PLAN:  Admit, cycle enzymes, n.p.o. after midnight for catheterization on  July 28th.  IV heparin, IV nitroglycerin, pain control.  Medications will be  ordered as dictated in the office note from Dr. Kae Heller visit (this would be  in January 2005).      Sueanne Margarita, P.A.                    Jenkins Rouge, [name]    GM/MEDQ  D:  11/24/2003  T:  11/24/2003  Job:  [id number]

## 2010-09-15 NOTE — Cardiovascular Report (Signed)
[facility]  Patient:    [name], [name] Visit Number: [id number] MRN: [id number]          Service Type: CAT Location: 3112 1624 01 Attending Physician:  Fatima Sanger Dictated by:   Vanna Scotland Olevia Perches, [name] [facility] Proc. Date: 01/31/01 Admit Date:  01/31/2001   CC:         Carlena Bjornstad, [name] [facility]  Cardiac Catheterization Laboratory  Haswell M. Lenna Gilford, [name] [facility]   Cardiac Catheterization  INDICATIONS:  Ms. Bailey is 70 years old and was recently seen in consultation by Dr. Ron Parker because of an abnormal ECG prior to surgery for a fatty tumor on her hip.  She also had a history of chest pain.  She had a Cardiolite scan which showed anterior ischemia and she was scheduled for a catheterization.  She is allergic to ASPIRIN and was started on Plavix prior to her catheterization procedure.  PROCEDURE:  The procedure was performed via the right femoral artery using arterial sheath and 6-French preformed coronary catheters.  A front wall arterial puncture was performed and Omnipaque contrast was used.  After the completion of diagnostic study, we made the decision to proceed with intervention on the LAD.  The patient was given weight-adjusted heparin to prolong the ACT to greater than 200 seconds.  We gave Integrilin after we crossed the lesion with the wire.  A venous sheath was placed in the right femoral vein.  We used a 7-French JL-4 guiding catheter and a Graphix-PT wire.  We crossed the totally obstructed lesion in the proximal LAD with the Graphix-PT wire without too much difficulty.  We predilated with a 2.5 x 20 mm Maverick balloon, performing one inflation of 7 atm for 35 seconds.  We then used a 3.5 x 20 mm Express and deployed this with one inflation of 12 atm for 30 seconds and a second inflation of 16 atm for 30 seconds with the balloon pulled inside the distal edge.  Repeat diagnostic study was then performed through the  guiding catheter.  The patient tolerated the procedure well and left the laboratory in satisfactory condition.  RESULTS:  The left main coronary artery is free of significant disease.  The left anterior descending artery was completed occluded proximally after two diagonal branches and septal perforator.  There was also a 95% lesion before the second diagonal branch.  The distal LAD filled via collaterals from the circumflex artery and the right coronary artery.  The circumflex artery was a fairly large vessel that gave rise to two large marginal branches and two posterolateral branches.  These vessels were free of significant disease.  The right coronary artery was a moderately large vessel that gave rise to a conus branch, right ventricular branch, a posterior descending branch, and a small posterolateral branch.  This fed collaterals to the mid LAD through septal perforators.  The left ventriculogram performed in the RAO projection showed good wall motion with no areas of hypokinesis.  The estimated ejection fraction was 60%.  The aortic pressure was 144/77, with a mean of 104.  Left ventricular pressure was 144/20.  Following stenting of the proximal LAD, the stenosis improved from 100% to less than 10%.  There was no dissection seen.  The distal LAD had a 40% mid lesion and 60% and 70% distal lesions.  CONCLUSIONS: 1. Coronary artery disease with total occlusion of the proximal left anterior    descending artery, no major obstruction of the circumflex and  right coronary    arteries, and normal left ventricular function. 2. Successful stenting of the proximal left anterior descending artery with    improvement in the percent of diameter narrowing from 100% to less than 10%    and improvement in the flow from TIMI-1 to TIMI-3 flow, with residual    distal disease, as described above.  DISPOSITION:  The patient was returned to the postangioplasty unit for  further observation. Dictated by:   Vanna Scotland Olevia Perches, [name] [place] Attending Physician:  Fatima Sanger DD:  01/31/01 TD:  02/01/01 Job: [id number]

## 2010-09-15 NOTE — Discharge Summary (Signed)
NAME:  [name], [name] NO.:  [id number]   MEDICAL RECORD NO.:  [id number]                   PATIENT TYPE:  INP   LOCATION:  2162                                 FACILITY:  [facility]   PHYSICIAN:  Dola Argyle, [name]                  DATE OF BIRTH:  August 28, 1940   DATE OF ADMISSION:  09/30/2002  DATE OF DISCHARGE:  10/02/2002                           DISCHARGE SUMMARY - REFERRING   PROCEDURE:  Adenosine Cardiolite June 3.   REASON FOR ADMISSION:  Please refer to dictated admission note.   LABORATORY DATA:  Cardiac enzymes:  CPK-MB and troponin-I markers normal  (x3).  Lipid profile:  Total cholesterol 159, triglycerides 59, HDL 49, LDL  98 (ratio 3.2).  WBC 10.9, HGB 11.6, HCT 34.4 (MCV 92), platelets 456.  Sodium 140, potassium 3.4, glucose 156, BUN 9, creatinine 0.6.  On  admission, normal liver enzymes.   Admission chest x-ray:  No active disease.   HOSPITAL COURSE:  Following direct admission from the office, where the  patient presented to Dr. Dola Argyle with recent history of recurrent chest  discomfort, the patient ruled out for myocardial infarction with all serial  cardiac markers within normal limits.   She was then referred for a pharmacologic stress test which revealed no  perfusion defects with evidence of breast attenuation; calculated ejection  fraction 61%.   Additionally, the patient was also referred for addition extensive  diagnostic testing consisting of CT scan of the chest and C-spine, given  complaint of neck discomfort.  The C-spine suggested DJD and spondylosis of  C5-6 and C6-7.  There was no evidence of spinal stenosis.   A CT scan of the chest showed no change in the previously-noted nodules (3  mm), as compared to prior studies of October 2003 and, more recently, April  2004.  Left lower lobe bronchiectasis and mild pleural thickening was also  noted; nephrolithiasis and right renal cyst.   No additional testing was  recommended.  Final recommendations by Dr.  Lattie Haw were to discharge on a one-week course of prednisone.  She also  suggested that if the patient were to have recurring symptoms to proceed  with a neurosurgical evaluation.   DISCHARGE MEDICATIONS:  1. Prednisone taper as directed.  2. Lasix 40 mg daily.  3. Norvasc 10 mg daily.  4. K-Dur 20 mEq daily.  5. Protonix 40 mg daily.  6. Toprol XL 100 mg daily.  7. Accupril 40 mg daily.  8. Plavix 75 mg daily.  9. Flonase nasal spray as directed.  10.      Clonidine 0.1 mg daily.  11.      Relafen 500 mg b.i.d.  12.      Patanol eyedrops as directed.   INSTRUCTIONS:  1. The patient is instructed to arrange followup with her primary care     physician, Dr. Teressa Lower, and the ensuing few  weeks.  2. The patient is instructed to return to Dr. Dola Argyle for an office     followup, as previously scheduled.   DISCHARGE DIAGNOSES:  1. Nonischemic chest pain.     a. Normal serial cardiac markers.     b. Normal adenosine Cardiolite; ejection fraction 61%.  2. Single-vessel coronary artery disease.     a. Status post stent left anterior descending 2000:  Patent by re-look        angiogram September 2002.     b. Normal Cardiolite September 2003.  3. Cervical spine degenerative disk disease.  4. History of fibromyalgia.  5. Hypertension.  6. Gastroesophageal reflux disease.  7. Remote tobacco.  8. Stable, tiny right upper lobe/right middle lobe pulmonary nodules.     Gene Serpe, P.A. [facility]                      Dola Argyle, [name]    GS/MEDQ  D:  10/02/2002  T:  10/02/2002  Job:  [id number]   cc:   Deborra Medina. Lenna Gilford, [name] [facility]

## 2010-09-15 NOTE — H&P (Signed)
NAME:  [name], [name]                         ACCOUNT NO.:  [id number]   MEDICAL RECORD NO.:  [id number]                   PATIENT TYPE:  INP   LOCATION:                                       FACILITY:  [facility]   PHYSICIAN:  Signa Kell, [name]             DATE OF BIRTH:  07/30/1940   DATE OF ADMISSION:  05/04/2003  DATE OF DISCHARGE:                                HISTORY & PHYSICAL   CHIEF COMPLAINT:  Chest pain (walk-in at cardiology office).   PRIMARY CARDIOLOGIST:  Dola Argyle, [name]   PRIMARY CARE PHYSICIAN:  Deborra Medina. Lenna Gilford, [name] [facility]   HISTORY OF PRESENT ILLNESS:  The patient is a pleasant 70 year old female  patient of Dr. Ron Parker with a history of coronary artery disease, status post  Express stent to the proximal LAD on January 31, 2001, secondary to abnormal  Cardiolite.  She had a relook catheterization on February 10, 2001, that  revealed a patent stent.  She was admitted in September of 2003 for chest  pain and she had a nonischemic Cardiolite.  She also has a history of  questionable thyroid and pulmonary nodules on CT scan that was followed by  Dr. Lenna Gilford.  She presented to our office today as a walk-in.  She has had  chronic chest discomfort over the years.  However, since December 25, her  pain has been worse.  She has noted this at rest.  It will get worse with  exertion and better with rest.  It is described as a tightness substernally  that radiates up into her left neck and down her left arm.  She has taken  nitroglycerin with relief.  She has noted associated shortness of breath,  nausea, and dizziness, but no diaphoresis.  We plan to admit her from our  office to [facility] today for further evaluation.   PAST MEDICAL HISTORY:  As noted above.  Catheterization on February 10, 2001,  revealed a patent LAD stent and 30 to 40% stenosis after the stent and 50 to  60% distal LAD stenosis.  The apical LAD has 70% stenosis.  The circumflex  has 20 to 30%  stenosis and RCA 20 to 30% stenosis.  Her Cardiolite in  September of 2003 was nonischemic with an EF of 71%.  She has a history of  hypertension, GERD, left ventricular hypertrophy, fibromyalgia,  anxiety/depression.  She is status post hysterectomy, cholecystectomy, and  bilateral tubal ligation.  As noted above, she has thyroid and pulmonary  nodules on CT scan followed by Dr. Lenna Gilford.  She is currently not on a statin.   ALLERGIES:  CARAFATE, CODEINE, TYLOX, CAFFEINE.   CURRENT MEDICATIONS:  1. Lasix 40 mg daily.  2. Norvasc 10 mg daily.  3. Patanol eye drops.  4. K-Dur 20 mEq daily.  5. Protonix 40 mg daily.  6. Toprol XL 100 mg daily.  7. Plavix 75  mg daily.  8. Flonase daily.  9. Relafen 750 mg b.i.d.  10.      Advair inhaler b.i.d.  11.      Clonidine 0.1 mg daily.  12.      Allegra 60 mg b.i.d.  13.      Darvocet p.r.n.  14.      Nitroglycerin p.r.n.  15.      Flexeril p.r.n.  16.      Xanax p.r.n.   SOCIAL HISTORY:  She lives in [place].  She is an ex-smoker and quit 30  years ago. She denies any alcohol abuse.   FAMILY HISTORY:  Positive for coronary artery disease in her mother.   REVIEW OF SYSTEMS:  She recently had an upper respiratory tract infection  that is slowly resolving. She still has a nonproductive cough.  She denies  any sore throats currently. She denies any rashes.  She denies any orthopnea  or paroxysmal nocturnal dyspnea.  She denies any dysuria or hematuria.  She  denies any numbness.  She has chronic myalgias and arthralgias secondary to  fibromyalgia.  She denies any bright red blood per rectum or melena.  She  denies any skin changes.   PHYSICAL EXAMINATION:  GENERAL:  She is a well-developed, well-nourished  female in no acute distress.  VITAL SIGNS:  Blood pressure 144/88, pulse 70, respirations 15, weight 194  pounds.  HEENT:  Normocephalic and atraumatic.  NECK:  Without lymphadenopathy, bruits, or JVD.  LYMPHS:  Without  lymphadenopathy.  HEART:  Regular rate and rhythm.  Normal S1 and S2.  No murmurs.  LUNGS:  Clear to auscultation bilaterally.  SKIN:  Without rashes.  ABDOMEN:  Soft and nontender with normal active bowel sounds.  No  hepatosplenomegaly.  EXTREMITIES:  Trace ankle edema bilaterally.  MUSCULOSKELETAL:  No joint deformities noted.  NEUROLOGY:  Nonfocal.  VASCULAR:  No carotid bruits noted.   EKG; heart rate 71, normal sinus rhythm, normal axis, nonspecific ST T wave  changes.  No change from previous tracing, August 10, 2002.   IMPRESSION:  1. Chest pain worrisome for unstable angina.  2. Coronary artery disease, status post stenting to left anterior descending     in October of 2002, nonischemic Cardiolite in September of 2003 with an     ejection fraction of 71%.  3. Hypertension, left ventricular hypertrophy by echocardiogram.  4. Gastroesophageal reflux disease.  5. Fibromyalgia.  6. Anxiety/depression.  7. History of thyroid and pulmonary nodules - followed by Dr. Lenna Gilford.   PLAN:  The patient was also seen by E. Raymond Gurney, [name] today.  Our plan  is to admit her directly to [facility] and check serial cardiac  enzymes.  Will continue her current medications and add IV nitroglycerin as  well as heparin.  We will also put her on an aspirin a day.  If her cardiac  enzymes are negative, then we will probably proceed with Cardiolite scan.  We will also check fasting lipids.  The patient is currently not on a statin  and probably should be on one given her history of coronary artery disease.      Richardson Dopp, Gemma Payor, [name]    SW/MEDQ  D:  05/04/2003  T:  05/04/2003  Job:  [id number]   cc:   Deborra Medina. Lenna Gilford, [name] [facility]  Dola Argyle, [name]

## 2010-09-15 NOTE — Consult Note (Signed)
NAME:  [name], [name] NO.:  [id number]   MEDICAL RECORD NO.:  [id number]                   PATIENT TYPE:  OUT   LOCATION:  XRAY                                 FACILITY:  [facility]   PHYSICIAN:  Dola Argyle, [name]                  DATE OF BIRTH:  1941/01/08   DATE OF CONSULTATION:  DATE OF DISCHARGE:  08/17/2002                                   CONSULTATION   HISTORY:  Blessen Kimbrough has return of her chest pain.  She has known  coronary disease and it will be necessary to admit her to the hospital  today.  The patient had a total occlusion of her LAD in the past.  This was  stented.  There was no significant disease of the circumflex and right  coronary artery.  She went home and this occurred back in September of 2002.  She actually returned with some recurrent pain and was recathed and it was  open.  Then in September of 2003, the patient had some recurring chest pain.  She was admitted on 01/05/2002.  The troponins were negative.  She had a  Cardiolite scan done with an ejection fraction of 71% and no ischemia.  She  had a chest CT that showed no pulmonary embolus.  There were 2 small lung  nodules and plans were made for follow-up CT scanning over time.   I saw the patient last on 08/10/2002.  She seemed stable at that time.   Last evening she had chest discomfort which persisted throughout the night.  There is question of some nitroglycerin relief.  The pain is in her left  upper chest and shoulder and into her neck.  She then walked into our office  today for further evaluation.   PAST MEDICAL HISTORY:   ALLERGIES:  Codeine and Tylox.   CURRENT MEDICATIONS:  Include Lasix 40, Norvasc 10, eyedrops, K-Dur 20,  Protonix 40, Toprol XL 100, Accupril 40, Plavix 75, Flonase.  There is an  inhaler, and Clonidine 0.1 mg daily.   OTHER MEDICAL PROBLEMS:  See the complete list below.   SOCIAL HISTORY:  The patient quit smoking 26 years ago.  She does  not drink  alcohol and she is divorced with several children.   FAMILY HISTORY:  There is no significant family history of premature  vascular disease.   REVIEW OF SYSTEMS:  The patient has had no major GI or GU symptoms.  The  remainder of her review of systems is negative.   PHYSICAL EXAMINATION:  VITAL SIGNS:  On exam today, blood pressure is  150/98.  GENERAL: She is in no distress.  LUNGS:  Clear.  NECK:  Normal.  HEENT:  Reveals no marked abnormalities.  CARDIAC:  Reveals an S1 with an S2.  There are no clicks or significant  murmurs.  ABDOMEN: Benign.  Extremities:  There is no peripheral  edema.  There are no major  musculoskeletal deformities.  NEUROLOGIC:  Grossly intact.   [name] EKG shows no diagnostic significant, acute changes at this  time.   PROBLEM:  1. History of intermittent chest pain with recurrence today.  2. Coronary disease.  The patient had an intervention to her LAD in 2000     with an early followup cath and then a negative Cardiolite in September     of 2003 with no ischemia.  3. Hypertension.  This appears to be somewhat elevated today.  4. GERD.  5. Some history of mild shortness of breath.  6. History of left ventricular hypertrophy.  7. History of fibromyalgia.  8. History of anxiety and depression.  9. History of hysterectomy, gallbladder surgery and tubal ligation.  10.      History of a CT scan done in 12/2001 with a question of thyroid and     a question of 2 small, pulmonary nodules.  The patient may have had a     recent followup and if not we will obtain a CT while she is in the     hospital.  11*.  Current chest pain.  This could possibly be cardiac but we have no  definite proof at this time.   The patient will be admitted to be observed.  Enzymes will be checked.  If  her cardiac enzymes are negative, the patient could have a followup.  She  could have either a Cardiolite in the hospital or a followup Cardiolite as  an  outpatient.                                                  Dola Argyle, [name]    Rockne Coons  D:  09/30/2002  T:  09/30/2002  Job:  [id number]   cc:   Deborra Medina. Lenna Gilford, [name] [facility]

## 2010-09-15 NOTE — Discharge Summary (Signed)
NAME:  [name], [name]                         ACCOUNT NO.:  [id number]   MEDICAL RECORD NO.:  [id number]                   PATIENT TYPE:  INP   LOCATION:  2013                                 FACILITY:  [facility]   PHYSICIAN:  Signa Kell, [name]             DATE OF BIRTH:  Sep 24, 1940   DATE OF ADMISSION:  05/04/2003  DATE OF DISCHARGE:  05/05/2003                           DISCHARGE SUMMARY - REFERRING   PROCEDURE:  Adenosine Cardiolite, May 05, 2003.   REASON FOR ADMISSION:  Please refer to dictated admission note.   LABORATORY DATA:  Serial cardiac markers normal.  D-dimer less than 0.22.  WBC 11,000, normal hemoglobin and hematocrit, platelets 492,000 on  admission.  Normal electrolytes, renal function and liver enzymes, lipid  profile, and total cholesterol 165, triglycerides 73, HDL 51, LDL 99  (cholesterol/HDL ratio 3.2).  TSH 0.98).   HOSPITAL COURSE:  Following direct admission from the office, where patient  presented with atypical chest pain in the setting of known coronary artery  disease, she ruled out for myocardial infarction with all serial cardiac  markers within normal limits.  A D-dimer was also negative.   The patient was thus referred for pharmacologic stress-testing, performed on  the day of discharge, during which she had mild chest pain but no  significant ST abnormalities during serial EKG tracing; subsequent review of  nuclear images revealed no perfusion abnormalities; calculated ejection  fraction 60%.   At the time of discharge, we discussed the importance of aggressive lipid  lowering.  In hospital results were reviewed with the patient and she agreed  to start on low dose Lipitor.  Of note, she does have fibromyalgia and we  asked her to monitor for any exacerbation of generalized muscle pain.  It  may be the patient ultimately will not be able to tolerate statins but would  be a good candidate for Zetia.   DISCHARGE MEDICATIONS:  1. Lasix 40  mg daily.  2. K-Dur 20 mEq daily.  3. Norvasc 10 mg daily.  4. Protonix 40 mg daily.  5. Toprol XL 100 mg daily.  6. Plavix 75 mg daily.  7. Flonase nasal spray as directed.  8. Advair inhaler b.i.d.  9. Clonidine 0.1 mg daily.  10.      Allegra 60 mg b.i.d.  11.      Flexeril p.r.n.  12.      Xanax 0.5 mg p.r.n.  13.      Patanol eye drops as directed.  14.      Lipitor 10 mg daily (new).  15.      Nitrostat 0.4 mg p.r.n. instructions.   The patient is scheduled to follow with Richardson Dopp, P.A.C. on Wednesday,  May 19, 2003 at 12 noon.  She is then to return to return to Dr. Dola Argyle for continued cardiac follow up.   DISCHARGE DIAGNOSES:  1. Non ischemic chest pain.  a. Normal serial cardiac markers.     b. Normal adenosine Cardiolite, May 05, 2003.  2. Coronary artery disease.     a. Status post stent to left anterior descending, October, 2002.  3. Dyslipidemia.  4. Hypertension.  5. Gastroesophageal reflux disease.  6. Type 2 diabetes mellitus.  7. Fibromyalgia.  8. History of small pulmonary nodules.     a. Stable by chest CT scan, June, 2004.      Gene Serpe, P.A. [facility]                      Signa Kell, [name]    GS/MEDQ  D:  05/05/2003  T:  05/05/2003  Job:  [id number]   cc:   Deborra Medina. Lenna Gilford, [name] [facility]

## 2010-09-15 NOTE — Assessment & Plan Note (Signed)
[place] OFFICE NOTE   NAME:Kaneko, TONYIA MARSCHALL                      MRN:          [id number]  DATE:01/08/2006                            DOB:          [birth year]    Ms. Stanish returns for followup of diarrhea.  Her diarrhea has improved  while on Cipro.  She took Flagyl for about two days but could not tolerate  it due to side effects.  She has noted small amounts of bright red blood on  the tissue paper and in the stool associated with her diarrhea.  She has no  nausea, vomiting, fevers, chills, or abdominal pain.  Her last colonoscopy  was in November, 2000, which showed diverticulosis, ascending colon lipomas,  and small internal hemorrhoids.  She also carries a diagnosis of irritable  bowel syndrome and GERD.  Please see my office dictation from December 13, 2003.   CURRENT MEDICATIONS:  Listed on the chart, updated and reviewed.   MEDICATION ALLERGIES:  CODEINE, CAFFEINE.   PHYSICAL EXAMINATION:  GENERAL:  No acute distress.  VITAL SIGNS:  Weight 189.8 pounds, blood pressure 142/92, pulse 56 and  regular.  HEENT:  Anicteric sclerae.  Oropharynx clear.  CHEST:  Clear to auscultation bilaterally.  CARDIAC:  Regular rate and rhythm without murmurs appreciated.  ABDOMEN:  Soft, nontender, nondistended.  Normoactive bowel sounds.  No  palpable organomegaly, masses, or hernias.  RECTAL:  Deferred until the time of colonoscopy.  EXTREMITIES:  Without clubbing, cyanosis or edema.  NEUROLOGIC:  Alert and oriented x3.  Grossly nonfocal.   ASSESSMENT/PLAN:  Diarrhea associated with hematochezia, rule out infectious  diarrhea, colitis, colorectal neoplasms, and hemorrhoids.  Complete course  of Cipro, as prescribed.  Risks, benefits and alternatives to colonoscopy  with possible biopsy, possible polypectomy, and possible __________ internal  hemorrhoids discussed with the patient.  She consents to proceed.   Consider  long term antispasmodic usage.  She is gaining control of her diarrhea with  p.r.n. use with cholestyramine powder.  Plavix will need to be held for 5-7  days prior to her procedure. We will obtain clearance from Dr. Ron Parker.                                   Pricilla Riffle. Fuller Plan, MD, [facility]   MTS/MedQ  DD:  01/08/2006  DT:  01/09/2006  Job #:  [id number]

## 2010-09-15 NOTE — Discharge Summary (Signed)
NAME:  [name], [name]                         ACCOUNT NO.:  [id number]   MEDICAL RECORD NO.:  [id number]                   PATIENT TYPE:  INP   LOCATION:  2013                                 FACILITY:  [facility]   PHYSICIAN:  Signa Kell, [name]             DATE OF BIRTH:  August 15, 1940   DATE OF ADMISSION:  05/04/2003  DATE OF DISCHARGE:  05/05/2003                           DISCHARGE SUMMARY - REFERRING   PROCEDURE:  Adenosine Cardiolite, January 5th.   REASON FOR ADMISSION:  Please refer to dictated admission note.   DICTATION ENDED AT [place], P.A. [facility]                      Signa Kell, [name]    GS/MEDQ  D:  05/05/2003  T:  05/05/2003  Job:  [id number]   cc:   Deborra Medina. Lenna Gilford, [name] [facility]

## 2010-09-15 NOTE — Discharge Summary (Signed)
[place]  [name]:    [name], [name] Visit Number: [id number] MRN: [id number]          Service Type: CAT Location: [id number] Attending Physician:  [name] Dictated by:   [name], P.A. Admit Date:  01/31/2001 Discharge Date: 02/01/2001   CC:         [name]. [name], [name] [facility]   Referring Physician Discharge [place]:  02-20-2041  BRIEF HISTORY:  [name] is a 70 year old female who was admitted to [facility]. [facility] on January 31, 2001, for evaluation of chest pain. [name] recently had an abnormal EKG, as well as an abnormal Cardiolite. She was admitted for cardiac catheterization.  She has no previous documented history of coronary artery disease.  She does have a history of fibromyalgia. She has some type of "fatty" tumor which will require surgery in [name] near future.  She was admitted to [facility] for further evaluation.  PAST MEDICAL HISTORY:  As noted, [name] had a Cardiolite performed on January 21, 2001.  This was a Persantine Cardiolite.  [name] ejection fraction was noted to be 67%.  She had mild to moderate ischemia in [name] anterior wall and anteroseptal walls and at [name] mid ventricular level of [name] apex.  She also has a history of fibromyalgia.  She has LVH by a previous echocardiogram.  She has a history of anxiety and depression.  PAST SURGICAL HISTORY:  She is status post hysterectomy, status post cholecystectomy, and status post tubal ligation.  ALLERGIES:  CODEINE, TYLOX, and CAFFEINE.  [name] also reports an allergy to ASPIRIN.  SOCIAL HISTORY:  [name] quit smoking 25 years ago.  She does not drink alcohol.  She disabled and divorced.  She has several children.  HOSPITAL COURSE:  As noted, this [name] was admitted to [name] on January 31, 2001, for cardiac catheterization.  This was performed by [name] R.  Olevia [name], [name]  [name] was found to have a totally occluded proximal LAD.  [name] circumflex and RCA arteries showed no significant disease.  She had a normal left ventricle with an ejection fraction of 60%.  [name] underwent PTCA stenting of [name] LAD on January 31, 2001, performed by [name], [name], reducing [name] 100% stenosis to less than 10%.  There was a mid to distal 40% lesion, as well as a distal 60% and further distal 70% residual lesions.  It was recommended that [name] be on Plavix as long-term therapy.  [name] following day, [name] had some mild nausea.  She was also noted to have an elevated white count, although she did not have a fever.  [name] patients nausea was treated symptomatically.  She was evaluated by [name], [name], and arrangements were made to discharge her later that day in improved and stable condition.  LABORATORY DATA:  [name], a CBC revealed hemoglobin 11.8, hematocrit 34, WBC 18.7, and platelets 483,000.  A basic metabolic panel revealed BUN 10, creatinine 0.6, potassium 3.9, and glucose 166.  An EKG showed normal sinus rhythm and rate 74 beats per minute with nonspecific T-wave abnormalities.  DISCHARGE MEDICATIONS:  [name] was told to resume her home medications, which included Lasix 40 mg, Norvasc 10 mg, Duratuss two tablets b.i.d. p.r.n., Beconase b.i.d., Patanol eyedrops, Xanax 0.5 mg one half tablet t.i.d., K-Dur 20 mEq I believe daily,  Protonix 40 mg, Relafen 750 mg, Toprol XL 100 mg, and Accupril 40 mg daily.  I believe that most of these medications are daily unless otherwise noted.  She was given a prescription for nitroglycerin with instructions in its use.  She was also started on Plavix 75 mg daily and she will remain on this long term.  She was given a prescription for Phenergan 25 mg one half to one tablet q.6h. p.r.n., #10, no refills.  She was also started on Foltx to be taken one daily.  It should  be noted that [name] has an aspirin allergy and she was not treated with aspirin.  ACTIVITY:  [name] was told to avoid any strenuous activity or driving for two days.  DIET:  She was to be on a low-salt, low-fat diet.  WOUND CARE:  [name] if she had any increased pain, swelling, or bleeding from her groin.  FOLLOW-UP:  [name] on Monday for a CBC blood test.  She was to call on Monday for an appointment to see [name], [name], in approximately two weeks.  She would see [name]. [name], [name], as needed or as scheduled.  PROBLEM LIST AT [name] TIME OF DISCHARGE: 1. Status post percutaneous transluminal coronary angioplasty and stenting of    [name] left anterior descending on January 31, 2001.  [name] ejection fraction was    60%.  Single vessel coronary artery disease with mild to moderate residual    left anterior descending lesions. 2. Elevated white blood cells of uncertain etiology.  Repeat on Monday. 3. Long-term Plavix use recommended. 4. History of fibromyalgia. 5. Left ventricular hypertrophy by old echocardiogram. 6. History of anxiety and depression. 7. Status post multiple surgeries as noted above. Dictated by:   [name], P.A. Attending Physician:  [name] DD:  02/01/01 TD:  02/01/01 Job: [id number]

## 2010-09-15 NOTE — H&P (Signed)
NAME:  [name], [name] NO.:  [id number]   MEDICAL RECORD NO.:  [id number]          PATIENT TYPE:  INP   LOCATION:  1843                         FACILITY:  [facility]   PHYSICIAN:  Jenkins Rouge, [name]     DATE OF BIRTH:  09/15/1940   DATE OF ADMISSION:  06/12/2005  DATE OF DISCHARGE:                                HISTORY & PHYSICAL   PRIMARY CARDIOLOGIST:  Dola Argyle, [name]   REASON FOR ADMISSION:  [name] is a 70 year old female, with known  coronary artery disease status post prior bare metal stenting of the mid  left anterior descending artery in October 2002, who now presents through  the emergency room with complaint of intermittent chest pain over the past  several days.   The patient last underwent cardiac catheterization in July 2005 at which  time she was found to have less than 10% restenosis of the LAD stent site  with residual 40% mid, 30% distal, and 70% apical LAD, 30% distal  circumflex, and 60% proximal __________ disease.  Left ventricular function  was normal.   The patient was last seen in the office in December 2006 by Dr. Dola Argyle  at which time she was stable from a cardiac standpoint and was continued on  her same medication regimen.   The patient now presents to the emergency room with complaint of recurrent  chest pain since Sunday.  She states that this has been intermittent,  occurring initially at rest, and which has progressed in terms of both  frequency and intensity over these past few days.  This morning, the patient  took a total of 3 sublingual nitroglycerin tablets reporting initial  improvement but subsequent recurrence of her pain.  She then contacted our  office and was instructed to proceed directly to the emergency room.   On arrival, electrocardiogram shows normal sinus rhythm with no ischemic  changes.  Initial cardiac markers are negative.   ALLERGIES:  ASPIRIN (GIF), CODEINE.   HOME MEDICATIONS:  1.   Metformin 500 twice daily.  2.  Lasix 40 daily.  3.  Lipitor 10 daily.  4.  Toprol XL 50 daily.  5.  Norvasc 10 daily.  6.  Clonidine 0.1 twice daily.  7.  Plavix.  8.  Imdur 30 daily.  9.  Potassium chloride 20 daily.  10. Protonix.   PAST MEDICAL HISTORY:  1.  Cardiac history as described above.  2.  Hypertension.  3.  Type 2 diabetes mellitus.  4.  Hyperlipidemia.  5.  Gastroesophageal reflux disease.  6.  Fibromyalgia.  7.  COPD, asthmatic bronchitis.  8.  Irritable bowel syndrome, diverticulosis.  9.  History of thyroid nodule.  10. Status post hysterectomy, cholecystectomy, and tubal ligation.   SOCIAL HISTORY:  The patient lives here in [place] with her son.  She has  not smoked tobacco in approximately 40 years.  Denies alcohol use.  She is  disabled secondary to fibromyalgia.   FAMILY HISTORY:  Mother alive at age 81 with history of coronary disease  with percutaneous interventions.  Father deceased at age  91, complications  from pneumonia, history of myocardial infarction, and strokes.   REVIEW OF SYSTEMS:  Exertional dyspnea, occasional orthopnea and PND, and  occasional extremity edema. Denies any recent evidence of upper or lower GI  bleeding.  Otherwise as noted in HPI.  Remaining systems negative.   PHYSICAL EXAMINATION:  VITAL SIGNS: Blood pressure 113/79, pulse 84 and  regular, respirations 18, temperature 98.1, O2 saturation 97%.  GENERAL:  A 70 year old female, lying supine, in no apparent distress.  HEENT: Normocephalic and atraumatic.  NECK: Bilateral carotid pulses, no bruits.  LUNGS: Clear to auscultation all fields.  HEART:  Regular rate and rhythm (S1 and S2). No significant murmurs.  ABDOMEN: Protuberant, benign.  EXTREMITIES:  Intact distal pulses without significant edema.  NEUROLOGIC: No focal deficits.   IMPRESSION:  [name] is a 70 year old female with history of significant  single-vessel coronary artery disease status post bare  metal stenting of the  mid left anterior descending artery in October 2002, who now presents to the  emergency room with recurrent chest pain over the past 3 days, partially  relieved by nitroglycerin.   Her last cardiac catheterization in July 2005 showed a widely patent stent  with residual moderate, nonobstructive coronary artery disease with normal  left ventricular function.   PLAN:  The patient will be admitted to telemetry for rule out of myocardial  infarction and plans to proceed with repeat diagnostic coronary angiography  tomorrow.  The patient will be placed on intravenous heparin in the  emergency room which will be managed per pharmacy protocol.  We will also  place her on low-dose intravenous  nitroglycerin.  She will continue on all current home medications except  metformin, and Lasix will be placed on hold.  In addition to cycling cardiac  markers, we will check a fasting lipid profile in the morning as well as a  TSH level.  Of note, the patient is intolerant to ASPIRIN and will need to  remain on Plavix.      Gene Serpe, P.A. [facility]    ______________________________  Jenkins Rouge, [name]    GS/MEDQ  D:  06/12/2005  T:  06/12/2005  Job:  [id number]   cc:   Deborra Medina. Lenna Gilford, [name] [facility]  [address]  [place] [address]

## 2010-09-15 NOTE — Assessment & Plan Note (Signed)
[facility] OFFICE NOTE   NAME:[name], [name] COFFEL                      MRN:          [id number]  DATE:07/15/2006                            DOB:          [birth year]    [name] complains of ongoing problems with rectal bleeding,  diarrhea, and crampy lower abdominal pain.  She had a rash shortly after  beginning Cipro, and this was discontinued.  Cholestyramine and Imodium  are often helpful for her diarrhea, but not always helpful.  She has not  responded to Anusol suppositories for presumed hemorrhoidal bleeding.  She is quite frustrated that her symptoms cannot be more adequately  controlled.   CURRENT MEDICATIONS:  Listed on the chart, updated and reviewed.   MEDICATION ALLERGIES:  CODEINE, CIPRO, CAFFEINE.   EXAM:  In no acute distress.  Weight 196/6 pounds, blood pressure 120/80, pulse 60 and regular.  CHEST:  Clear to auscultation bilaterally.  CARDIAC:  Regular rate and rhythm without murmur.  ABDOMEN:  Soft with minimal lower abdominal tenderness to deep  palpation.  No rebound or guarding.  No palpable organomegaly, masses,  or hernias.   ASSESSMENT AND PLAN:  Diarrhea predominant irritable bowel syndrome with  rectal bleeding related to internal and external hemorrhoids.  Continue  cholestyramine b.i.d. and Imodium b.i.d.  Begin a course of Xifaxan 400  mg p.o. t.i.d. for 10 days and Robinul Forte 1 b.i.d.  Begin Analpram  2.5% cream twice a day for 6 weeks and Canasa 1000 mg suppositories  daily for 4 weeks.  Return office visit with me in 6 to 8 weeks.     Pricilla Riffle. Fuller Plan, MD, [facility]  Electronically Signed    MTS/MedQ  DD: 07/15/2006  DT: 07/16/2006  Job #: [id number]   cc:   Deborra Medina. Lenna Gilford, MD

## 2010-09-15 NOTE — Cardiovascular Report (Signed)
NAME:  [name], [name] NO.:  [id number]   MEDICAL RECORD NO.:  [id number]          PATIENT TYPE:  INP   LOCATION:  3705                         FACILITY:  [facility]   PHYSICIAN:  Ethelle Lyon, [name] LHCDATE OF BIRTH:  May 05, 1940   DATE OF PROCEDURE:  06/13/2005  DATE OF DISCHARGE:                              CARDIAC CATHETERIZATION   PROCEDURES:  Left heart catheterization, left ventriculography, coronary  angiography.   INDICATIONS:  [name] is a 70 year old woman with coronary disease. She  is status-post stenting of the mid-LAD in 2002 using a bare-metal stent. She  has had two subsequent cardiac catheterizations for what is felt to the  noncardiac chest discomfort. She now presents with chest discomfort  occurring at rest which she feels is similar to her prior ischemic pain. She  was admitted to hospital and ruled out for myocardial infarction by serial  enzymes and electrocardiograms. She was brought to the cardiac  catheterization lab for cardiac catheterization and possible percutaneous  revascularization.   PROCEDURAL TECHNIQUE:  Informed consent was obtained. Under 1% lidocaine  local anesthesia, a 5-French sheath was placed in the right common femoral  artery using the modified Seldinger technique. Diagnostic angiography and  ventriculography were performed using JL-4, JR-4 and pigtail catheters. The  patient tolerated the procedure well and transferred to the holding room in  stable condition. Sheath will be removed there.   COMPLICATIONS:  None.   FINDINGS:  1.  LV: 148/4/9. EF 65% without regional wall motion abnormality.  2.  No aortic stenosis or mitral regurgitation.  3.  Left main: Angiographically normal.  4.  LAD: A very large vessel which wraps the apex of the heart to supply      much of the inferior wall and also supplies three relatively small      diagonals. The first diagonal was quite small. The ostium is hazy in      some  views, but not in others. The mid-LAD stent appears to jail the      second diagonal branch. There is normal flow in this diagonal. There is      no in-stent restenosis. The apical LAD has a 40% stenosis.  5.  Circumflex: Large, codominant vessel. It gives rise to three obtuse      marginals and a PDA. It has minor luminal irregularities.  6.  RCA: Moderate-sized codominant vessel. There is a 20% stenosis      proximally.   IMPRESSION/RECOMMENDATIONS:  Widely patent left anterior descending artery  stent and no significant disease in the remainder of the left anterior  descending artery, circumflex, or right coronary artery. If her symptoms  do represent an acute coronary syndrome, I suspect that the culprit is the  ostium of the small first diagonal branch. She has normal flow in this  branch and the territory it supplies is small. I have recommend medical  therapy for it. We will continue her aspirin, beta blocker and Statin.      Ethelle Lyon, [name] [facility]  Electronically Signed     WED/MEDQ  D:  06/13/2005  T:  06/13/2005  Job:  [id number]   cc:   Deborra Medina. Lenna Gilford, [name] [facility]  [address]  [place]  [place] [address]   Dola Argyle, [name]  [address] [place]  [place] [address]

## 2010-11-02 ENCOUNTER — Other Ambulatory Visit: Payer: Self-pay | Admitting: Pulmonary Disease

## 2010-11-03 ENCOUNTER — Telehealth: Payer: Self-pay | Admitting: Pulmonary Disease

## 2010-11-03 ENCOUNTER — Other Ambulatory Visit: Payer: Self-pay | Admitting: Cardiology

## 2010-11-03 NOTE — Telephone Encounter (Signed)
Pt has upcoming appt w/ SN in Sept.  Per Leigh, pt may have 2 additional refills.  Called [facility] spoke with Broadus John, advised pt may have the 2 additional refills.  Broadus John read this back to me and verbalized his understanding.

## 2010-11-03 NOTE — Telephone Encounter (Signed)
Last OV 06/30/10 Per [facility] alprazolam rx was called in on 07/03/10 #90 x 5.  Called [facility], spoke with Truddie Crumble.  He states for some reason this rx was for only 1 fill - either we did not say additional fills or they did not write it down.  Pls advise if rx ok.  Thanks!

## 2010-11-07 ENCOUNTER — Other Ambulatory Visit: Payer: Self-pay | Admitting: *Deleted

## 2010-11-07 MED ORDER — MONTELUKAST SODIUM 10 MG PO TABS: 10.0000 mg | ORAL_TABLET | Freq: Every day | ORAL | Status: DC

## 2010-11-08 ENCOUNTER — Encounter: Payer: Self-pay | Admitting: Gastroenterology

## 2010-11-08 ENCOUNTER — Ambulatory Visit (INDEPENDENT_AMBULATORY_CARE_PROVIDER_SITE_OTHER): Payer: Medicare Other | Admitting: Gastroenterology

## 2010-11-08 VITALS — BP 132/76 | HR 68 | Ht 62.0 in | Wt 213.0 lb

## 2010-11-08 DIAGNOSIS — K5150 Left sided colitis without complications: Secondary | ICD-10-CM

## 2010-11-08 MED ORDER — MESALAMINE 1000 MG RE SUPP: 1000.0000 mg | Freq: Every day | RECTAL | Status: DC

## 2010-11-08 MED ORDER — MESALAMINE 400 MG PO TBEC: 1600.0000 mg | DELAYED_RELEASE_TABLET | Freq: Three times a day (TID) | ORAL | Status: DC

## 2010-11-08 NOTE — Progress Notes (Signed)
History of Present Illness: This is a 70 year old female with left-sided ulcerative colitis who notes intermittent diarrhea and occasional small amounts of rectal bleeding. The symptoms have been inactive for the past several weeks. She has hemorrhoids and a history of adenomatous colon polyps as well. Her reflux symptoms are well controlled on omeprazole 40 mg daily. CBC, CMET in 06/2010 were normal. She has multiple comorbidities including coronary artery disease, congestive heart failure, bronchiectasis, obesity, diabetes.  Current Medications, Allergies, Past Medical History, Past Surgical History, Family History and Social History were reviewed in [employer] record.  Physical Exam: General: Well developed , well nourished, no acute distress Head: Normocephalic and atraumatic Eyes:  sclerae anicteric, EOMI Ears: Normal auditory acuity Mouth: No deformity or lesions Lungs: Clear throughout to auscultation Heart: Regular rate and rhythm; no murmurs, rubs or bruits Abdomen: Soft, non tender and non distended. No masses, hepatosplenomegaly or hernias noted. Normal Bowel sounds Musculoskeletal: Symmetrical with no gross deformities  Pulses:  Normal pulses noted Extremities: No clubbing, cyanosis, edema or deformities noted Neurological: Alert oriented x 4, grossly nonfocal Psychological:  Alert and cooperative. Normal mood and affect  Assessment and Recommendations:  1. Left-sided ulcerative colitis. Continue Asacol 4.8 g daily and Canasa 1000 mg suppositories once daily as needed.   2. GERD. Continue omeprazole 40 mg daily and standard antireflux measures.  3. Personal history of adenomatous colon polyps initially diagnosed November 2007. Surveillance colonoscopy recommended in November 2012.

## 2010-11-08 NOTE — Patient Instructions (Signed)
Your recall Colonoscopy is November 2012. We will send you a letter at that time. Your prescriptions have been sent to the pharmacy.

## 2010-11-10 ENCOUNTER — Telehealth: Payer: Self-pay | Admitting: Pulmonary Disease

## 2010-11-10 DIAGNOSIS — R109 Unspecified abdominal pain: Secondary | ICD-10-CM

## 2010-11-10 NOTE — Telephone Encounter (Signed)
Spoke with Leslye Peer at the pharmacy and she states that the [place] has been taken off of pt formulary. She states she tried flexeril and soma and they were not covered either. She states the only med she could find that was covered was Tizanadine. Please advise if ok to change to this.[place] Bing, CMA

## 2010-11-13 MED ORDER — TIZANIDINE HCL 4 MG PO TABS: 4.0000 mg | ORAL_TABLET | Freq: Three times a day (TID) | ORAL | Status: AC | PRN

## 2010-11-13 NOTE — Telephone Encounter (Signed)
Per SN---tizanidine 75m   #90   1 tablet by mouth three times daily as needed for muscle spasms and refill x 5.  thanks

## 2010-11-13 NOTE — Telephone Encounter (Signed)
rx sent. [place] Bing, CMA

## 2010-11-29 ENCOUNTER — Other Ambulatory Visit: Payer: Self-pay | Admitting: Pulmonary Disease

## 2010-11-29 ENCOUNTER — Other Ambulatory Visit: Payer: Self-pay | Admitting: *Deleted

## 2010-11-29 ENCOUNTER — Other Ambulatory Visit: Payer: Self-pay | Admitting: Adult Health

## 2010-11-29 MED ORDER — CLOPIDOGREL BISULFATE 75 MG PO TABS: 75.0000 mg | ORAL_TABLET | Freq: Every day | ORAL | Status: DC

## 2010-12-05 NOTE — Telephone Encounter (Signed)
Electronic refill request received [facility] > refills given 10/2010 #30 with 3 refills at that ov.

## 2010-12-07 ENCOUNTER — Telehealth: Payer: Self-pay | Admitting: Pulmonary Disease

## 2010-12-07 NOTE — Telephone Encounter (Signed)
Spoke with pharmacy and they stats they sent a refill for MMW but received an electronic response for nystatin so they wanted to know which it should be. According to pt med list she has been on MMW in the past, not nystatin so I advised it should be MMW.[place] Bing, CMA

## 2010-12-26 ENCOUNTER — Other Ambulatory Visit: Payer: Self-pay | Admitting: Pulmonary Disease

## 2011-01-02 ENCOUNTER — Ambulatory Visit (INDEPENDENT_AMBULATORY_CARE_PROVIDER_SITE_OTHER): Payer: Medicare Other | Admitting: Pulmonary Disease

## 2011-01-02 ENCOUNTER — Other Ambulatory Visit: Payer: Self-pay | Admitting: Adult Health

## 2011-01-02 ENCOUNTER — Encounter: Payer: Self-pay | Admitting: Pulmonary Disease

## 2011-01-02 ENCOUNTER — Other Ambulatory Visit (INDEPENDENT_AMBULATORY_CARE_PROVIDER_SITE_OTHER): Payer: Medicare Other

## 2011-01-02 DIAGNOSIS — E119 Type 2 diabetes mellitus without complications: Secondary | ICD-10-CM

## 2011-01-02 DIAGNOSIS — J45909 Unspecified asthma, uncomplicated: Secondary | ICD-10-CM

## 2011-01-02 DIAGNOSIS — J479 Bronchiectasis, uncomplicated: Secondary | ICD-10-CM

## 2011-01-02 DIAGNOSIS — I10 Essential (primary) hypertension: Secondary | ICD-10-CM

## 2011-01-02 DIAGNOSIS — E669 Obesity, unspecified: Secondary | ICD-10-CM

## 2011-01-02 DIAGNOSIS — E559 Vitamin D deficiency, unspecified: Secondary | ICD-10-CM

## 2011-01-02 DIAGNOSIS — E7800 Pure hypercholesterolemia, unspecified: Secondary | ICD-10-CM

## 2011-01-02 DIAGNOSIS — D649 Anemia, unspecified: Secondary | ICD-10-CM

## 2011-01-02 DIAGNOSIS — K5190 Ulcerative colitis, unspecified, without complications: Secondary | ICD-10-CM

## 2011-01-02 DIAGNOSIS — F411 Generalized anxiety disorder: Secondary | ICD-10-CM

## 2011-01-02 DIAGNOSIS — K5730 Diverticulosis of large intestine without perforation or abscess without bleeding: Secondary | ICD-10-CM

## 2011-01-02 DIAGNOSIS — K219 Gastro-esophageal reflux disease without esophagitis: Secondary | ICD-10-CM

## 2011-01-02 DIAGNOSIS — I2510 Atherosclerotic heart disease of native coronary artery without angina pectoris: Secondary | ICD-10-CM

## 2011-01-02 DIAGNOSIS — M1990 Unspecified osteoarthritis, unspecified site: Secondary | ICD-10-CM

## 2011-01-02 DIAGNOSIS — IMO0001 Reserved for inherently not codable concepts without codable children: Secondary | ICD-10-CM

## 2011-01-02 DIAGNOSIS — J984 Other disorders of lung: Secondary | ICD-10-CM

## 2011-01-02 LAB — BASIC METABOLIC PANEL
BUN: 12 mg/dL (ref 6–23)
CO2: 28 mEq/L (ref 19–32)
Chloride: 102 mEq/L (ref 96–112)
GFR: 254.54 mL/min (ref >60.00)
Glucose, Bld: 131 mg/dL — ABNORMAL HIGH (ref 70–99)
Potassium: 3.3 mEq/L — ABNORMAL LOW (ref 3.5–5.1)
Sodium: 140 mEq/L (ref 135–145)

## 2011-01-02 LAB — CBC WITH DIFFERENTIAL/PLATELET
Basophils Absolute: 0 10*3/uL (ref 0.0–0.1)
HCT: 38.2 % (ref 36.0–46.0)
Hemoglobin: 12.5 g/dL (ref 12.0–15.0)
Lymphs Abs: 1.9 10*3/uL (ref 0.7–4.0)
MCHC: 32.8 g/dL (ref 30.0–36.0)
MCV: 94 fl (ref 78.0–100.0)
Monocytes Absolute: 0.6 10*3/uL (ref 0.1–1.0)
Monocytes Relative: 5.1 % (ref 3.0–12.0)
Neutro Abs: 8.7 10*3/uL — ABNORMAL HIGH (ref 1.4–7.7)
RDW: 14.8 % — ABNORMAL HIGH (ref 11.5–14.6)

## 2011-01-02 LAB — LIPID PANEL: Cholesterol: 126 mg/dL (ref 0–200)

## 2011-01-02 LAB — HEMOGLOBIN A1C: Hgb A1c MFr Bld: 6.9 % — ABNORMAL HIGH (ref 4.6–6.5)

## 2011-01-02 MED ORDER — PROMETHAZINE HCL 25 MG PO TABS: 25.0000 mg | ORAL_TABLET | Freq: Two times a day (BID) | ORAL | Status: DC | PRN

## 2011-01-02 MED ORDER — NABUMETONE 750 MG PO TABS: 750.0000 mg | ORAL_TABLET | Freq: Two times a day (BID) | ORAL | Status: DC

## 2011-01-02 MED ORDER — ALPRAZOLAM 0.5 MG PO TABS: 0.5000 mg | ORAL_TABLET | Freq: Two times a day (BID) | ORAL | Status: DC | PRN

## 2011-01-02 MED ORDER — HYDROCODONE-ACETAMINOPHEN 5-500 MG PO CAPS: 1.0000 | ORAL_CAPSULE | Freq: Two times a day (BID) | ORAL | Status: DC | PRN

## 2011-01-02 MED ORDER — LOSARTAN POTASSIUM 50 MG PO TABS: 50.0000 mg | ORAL_TABLET | Freq: Every day | ORAL | Status: DC

## 2011-01-02 MED ORDER — METHOCARBAMOL 500 MG PO TABS: 500.0000 mg | ORAL_TABLET | Freq: Two times a day (BID) | ORAL | Status: DC

## 2011-01-02 NOTE — Patient Instructions (Signed)
Today we updated your med list in EPIC...    We changed the Clonidine BP med to LOSARTAN 15m take one tab daily...    We changed the Parafon muscle relaxer to ROBAXIN (generic) take twice daily as needed for muscle spasm...  Today we did your follow up fasting blood work...    Please call the [facility] in a few days for your results...    Dial [id number] when prompted enter your patient number followed by the # symbol...    Your patient number is:  [id number]# Call for any questions...  Let's plan another routine follow up visit in  6 months..Marland KitchenMarland Kitchen

## 2011-01-02 NOTE — Progress Notes (Signed)
Subjective:    Patient ID: [name], female    DOB: July 25, 1940, 70 y.o.   MRN: [id number]  HPI 70 y/o BF here for a follow up visit... she has multiple medical problems as noted below...    ~  May 22, 2010:  she was seen 05/18/10 for a 24moROV & complained of left CWP that was evaluated in the ER & [place] are out [place] whack due to the coughing" >> see prev EMR note for details... she was asked to watch for rash on her side as poss evolution of shingles diagnosis & she called today w/ c/o rash on her side> asked to come for recheck so we could document the distrib of the rash & start therapy...  However on inspection there is no rash present & when asked she said "Oh, isn't that a rash" pointing to her normal skin in her left side... no redness, no hyperesthesia, etc... she did note some itching & as time went on the itching seemed to be more or a problem...     We discussed Shingles & what it looks like> offered Atarax to take for her itching... continue other Rx.  ~  June 30, 2010:  she notes her wt is up 3# to 211# stating that she can't walk much due to her arthritis ("Xrays showed arthritis all over my body & I have to use a cane")> c/o pain but she says her left chest wall pain is improved [facility] 2 [facility] also has her on Nabumetone & Parafon...    She sees DrStark for GI> on Omep, Asacol, Canasa, Analpram, Promethazine...    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    We follow for Resp problems> on Flonase, NEBS vs Ventolin, Singulair, Mucinex, Tussionex... and [facility] issues on Lipitor, Metformin, and Alprazolam for nerves...  Needs fasting labs today, on Lip10 now per her insurance...  ~  January 02, 2011:  670moOV & she persists w/ mult somatic complaints> requests refills of several meds today...    Asthma, Bronchiectasis, & LLL nodule on CT scan> she only  takes her breathing meds as needed including Albuterol for NEB vs Proventil MDI, & she stopped Advair on her own; prob granuloma LLL (see below)...    HBP & CAD> she saw DrKatz for Cards f/u 4/12 on Aten, Amlodip, Clonidine, Lasix, KCl, Imdur, Plavix; she had some CWP but no angina> no changes made; BP today= 130/78 and she notes some atypCP, rare palpit, occas dizzy w/o syncope, +SOB/ DOE, & intermittent edema; we decided to stop the Clonidine & substitute Losartan 5062m...    Chol> FLP looks good on Lipitor 64m23m& she is encouraged to get on a good low chol, low fat diet & get the weight down...    DM & Obesity> DM contol is adeq on Metform monotherapy but she desperately needs to lose the wt & has been totally ineffective on her prev diets...    GI> GERD, Divertics, Hx UC, Polyps> she saw DrStark 7/12 & she was stable on her Omep40mg68mor reflux, Asacol 4.8g daily & Canasa suppos prn for her UC, & f/u colon due 11/12...    GU> followed by DrWrenn as noted below w/ stress incont, hx UTIs, hx hematuria, & prob cyst left kidney which he continues to follow...    DJD, FM> managed by DrVoytek on Relafen, Lorcet, and  various other pain meds over the year...    Vit D defic>     Anxiety>     Anemia>            Problem List:          ALLERGY (ICD-995.3) - on Saline, FLONASE 2spBid, SINGULAIR 72m/d... she does not wish to consider decreasing these meds...  ASTHMA (ICD-493.90) - prev on Advair but she stopped on her own... she takes MMchs [place] as needed, & NEBULIZER w/ Albut as needed (ave 3x per week) & PROAIR Prn when out & about... she is an ex-smoker having quit in 1975, no recent exacerbations.  BRONCHIECTASIS (ICD-494.0) - CT Chest 3/10 showed bilat LL bronchiectasis L>R...  PULMONARY NODULE (ICD-518.89) - 1.4cm nodule in LLL showed up on Urology CTAbd 07/12/08...  CT Chest 07/22/08 confirmed 1.4cm peripheral LLL nodule that wasn't present on CT in 2005 & no other lesions, +bronchiectasis in  both LLs L>R, mod cardiomegaly...  PET Scan 4/10 showed no abn hypermetabolic activity- therefore likely scar or granuloma... ~  CXR 2/11 showed cardiomeg, some COPD, NAD- LLL nodule seen on CT 3/10 is not vis on CXR. ~  CXR 1/12 in ER showed cardiomeg, vasc congestion, no overt edema, no nodule seen.  HYPERTENSION (ICD-401.9) - controlled on ATENOLOL 570md, NORVASC 1021m, CLONIDINE 0.1mg26m, LASIX 40mg32mtabs/d (2AM, 1PM), KCL 20Bid... BP=138/80 today, not checking BP at home... denies HAs, visual changes, palipit, dizziness, syncope, dyspnea, edema, etc...  CORONARY ARTERY DISEASE (ICD-414.00) - on PLAVIX 75mg/56mIMDUR 30mg- 35mtab daily... ~  she is s/p PTCA & stent to the LAD on 10/02... ~  last cath 2/07 w/ patent LAD stent, non-obstructive dis otherwise, EF=65%... ~  last NuclearStressTest 5/08 showed no ischemia, EF=64% (similiar to 2005)... ~  hosp in Jun09 w/ CP- cath showing non-obstructive CAD w/ 25-40% lesions in all 3 vessels, prev stent in LAD patent, norm LVF.. ~  Baseline EKG shows NSR, minor NSSTTWA, NAD...  CHEST PAIN, ATYPICAL (ICD-786.59) - she has chronic CWP, prob related to her fibromyalgia... ~  1/12:  she went to ER, then [facility] w/ left CWP- cardiac evals neg, treated w/ rest, heat, Vicodin, Tussionex, etc... subseq [facility] (ICD-272.0) - on LIPITOR 10mg/d,67miet Rx.. ~  FLP 11/[place] TChol 132, TG 58, HDL 60, LDL 60 ~  FLP 7/09 showed TChol 116, TG 49, HDL 49, LDL 57 ~  FLP in hosp 3/10 showed TChol 130, TG 55, HDL 53, LDL 66 ~  FLP 2/11 on Cres5 showed TChol 104, TG 67, HDL 50, LDL 40 ~  Crestor 5mg chan52m to Lipitor 10mg per 26mrance co. ~  FLP 3/12 on Lip10 showed TChol 139, TG 64, HDL 57, LDL 69 ~  FLP 9/12 on Lip10 showed TChol 126, TG 79, HDL 54, LDL 57  DIABETES MELLITUS (ICD-250.00) - on METFORMIN 500mgBid, +6m carb diet... ~  last labs 11/08 showed BS=120, HgA1c=6.2 ~  labs 6/09 in hosp showed  BS=110-120, HgA1c= 6.6... same medMarland KitchenMarland Kitchen needs better diet! ~  labs 3/10 in hosp showed BS= 113-155, HgA1c= 6.7 ~  labs 2/11 showed BS= 108, A1c= 7.1 ~  labs 1/12 in ER showed BS= 167... ~  labs 3/12 showed BS= 121, A1c= 7.2... continueMarland KitchenMarland Kitchename meds, better diet! ~  Labs 9/12 showed BS= 131, A1c= 6.9  OBESITY (ICD-278.00) -  ~  weight 11/09 = 204# ~  weight 3/10 = 208# ~  weight 2/11 = 205#...Marland KitchenMarland Kitchen  we reviewed diet + exercise program. ~  weight 1/12 = 208# ~  Weight 3/12 = 211#... We reviewed diet & exercise yet again. ~  Weight 9/12 = 216#  GERD (ICD-530.81) - on PRILOSEC 29m- taking 1-2 daily but insurance won't pay- wants OMEPRAZOLE 432md.  DIVERTICULOSIS OF COLON (ICD-562.10) & COLONIC POLYPS (ICD-211.3) - last colonoscopy was 7/08 w/ divertics & colitis seen, + hyperplastic polyp removed...  Hx of ULCERATIVE COLITIS (ICD-556.9) - followed by DrStark for GI- she has been on on Lialda, Rowasa, & Hydrocort enemas in the past... ~  seen 10/11 by DrStark w/ freq loose stools- Asacol incr to 4tabs Tid & CANASA suppos for 82m49mo  Her maintenance regimen is ASACOL 400m70mtabs Tid and CANASA suppos as needed...  MICROSCOPIC HEMATURIA (ICD-599.72),  NEOPLASM, KIDNEY (ICD-239.5),  STRESS INCONTINENCE (ICD-788.39) - eval and Rx by DrWrenn 9/09 w/ stress incontinence, dysuria w/ microscopic hematuria-  Cysto w/ bladder bx was neg; and CT + Sonar w/ 1.8cm left upper pole renal lesion of ?etiology- poss tumor vs cyst (min enhancement, solid on sonar w/ min blood flow + 2 sm cysts also seen)...  they are following a watchful waiting protocol & repeat CT 3/10 showed stable 1.7cm category II cyst upper pole of left kidney without change... f/u planned 25yr.25yrGENERATIVE JOINT DISEASE (ICD-715.90) - she is followed by DrVoyLoyalELAFLavoniaAFRocky FordCEWilson  she was seen 12/11 w/ bilat foot pain & XRay showed arthritic changes- given DepoMedrol+Toradol shot IM.  FIBROMYALGIA (ICD-729.1)  VITAMIN D  DEFICIENCY (ICD-268.9) - Vit D level 2/11= 11 and pt rec to take Vit D 50000 u weekly but ?never filled this Rx... therefore rec 8/11 to switch to OTC Vit D 5000 u daily from the health food store...  ~  Labs 3/12 showed Vit D level = 46  ANXIETY (ICD-300.00) - she has lots of stress caring for Momma... on ALPRAZOLAM 0.5mg T13mPrn...  Hx of ANEMIA (ICD-285.9) - on NUIRON 150mg/d62m ~  labs 11/08 showed Hg=12.2, Fe=38/ TIBC=287/ sat=9%... ~  labs 3/10 in hosp showed Hg= 13.0, MCV= 92... ~  labs 2/11 showed Hg= 12.7, Fe= 67... ~  labs 1/12 in ER showed Hg= 11.7 ~  Labs 3/12 showed Hg= 12.3, MCV= 93 ~  Labs 9/12 showed Hg= 12.5, MCV= 94   Past Surgical History  Procedure Date  . Cholecystectomy   . Hand surgery 01/2006    Right Hand Surgery by Dr. Gramig Amedeo Plentya     Stent  . Abdominal hysterectomy     Outpatient Encounter Prescriptions as of 01/02/2011  Medication Sig Dispense Refill  . albuterol (PROVENTIL) (2.5 MG/3ML) 0.083% nebulizer solution Take 2.5 mg by nebulization as needed. USE IN NEBULIZER UP TO THREE TIME PER DAY AS NEEDED FOR WHEEZING.       . albutMarland Kitchenrol (VENTOLIN HFA) 108 (90 BASE) MCG/ACT inhaler Inhale 2 puffs into the lungs as directed.        . ALPRAMarland Kitchenolam (XANAX) 0.5 MG tablet Take 0.5 mg by mouth 3 (three) times daily. NOT TO EXCEED 3 PER DAY       . amLODipine (NORVASC) 10 MG tablet TAKE 1 TABLET EVERY DAY  34 tablet  5  . atenolol (TENORMIN) 50 MG tablet TAKE 1 TABLET EVERY DAY  30 tablet  5  . atorvastatin (LIPITOR) 10 MG tablet Take 1 tablet (10 mg total) by mouth daily.  30 tablet  11  . chlorzoxazone (PARAFON) 500 MG tablet  TAKE 1 TABLET 4 TIMES A DAY AS NEEDED FOR MUSCLE SPASMS  120 tablet  3  . Cholecalciferol (VITAMIN D-3) 5000 UNITS TABS Take 5,000 Units by mouth daily.        . cloNIDine (CATAPRES) 0.1 MG tablet TAKE 1 TABLET BY MOUTH TWICE A DAY  180 tablet  5  . clopidogrel (PLAVIX) 75 MG tablet Take 1 tablet (75 mg total) by mouth daily.  30 tablet  9    . fluticasone (FLONASE) 50 MCG/ACT nasal spray 2 sprays by Nasal route 2 (two) times daily.        . furosemide (LASIX) 40 MG tablet TAKE 2 TABLETS IN THE IN THE MORNING, AND TAKE 1 TABLET BY MOUTH IN THE EVENING  90 tablet  6  . guaiFENesin (MUCINEX) 600 MG 12 hr tablet 600 mg. AS NEEDED      . hydrocodone-acetaminophen (LORCET-HD) 5-500 MG per capsule Take 1 capsule by mouth every 6 (six) hours as needed.        . hydrocortisone-pramoxine (ANALPRAM-HC) 2.5-1 % rectal cream Place rectally 2 (two) times daily. APPLY A SMALL AMOUNT TO AFFECTED AREA.       . hydrOXYzine (ATARAX) 25 MG tablet Take 25 mg by mouth every 4 (four) hours as needed.        . isosorbide mononitrate (IMDUR) 60 MG 24 hr tablet TAKE 1/2 TABLET BY MOUTH DAILY  15 tablet  5  . KLOR-CON M20 20 MEQ tablet TAKE 1 TABLET BY MOUTH TWICE A DAY  60 tablet  1  . mesalamine (ASACOL) 400 MG EC tablet Take 4 tablets (1,600 mg total) by mouth 3 (three) times daily.  360 tablet  11  . mesalamine (CANASA) 1000 MG suppository Place 1 suppository (1,000 mg total) rectally at bedtime.  30 suppository  11  . metFORMIN (GLUCOPHAGE) 500 MG tablet TAKE 1 TABLET BY MOUTH TWICE A DAY  60 tablet  5  . montelukast (SINGULAIR) 10 MG tablet Take 1 tablet (10 mg total) by mouth at bedtime.  30 tablet  3  . nabumetone (RELAFEN) 750 MG tablet Take 750 mg by mouth 2 (two) times daily.        Marland Kitchen NITROSTAT 0.4 MG SL tablet 1 TABLET UNDER TONGUE AT ONSET OF CHEST PAIN YOU MAY REPEAT EVERY 5 MINUTES FOR UP TO 3 DOSES.  25 tablet  6  . nystatin (MYCOSTATIN) 100000 UNIT/ML suspension GARGLE & SWALLOW 1 TEASPOONFUL 4 TIMES A DAY  120 mL  5  . omeprazole (PRILOSEC) 40 MG capsule Take 40 mg by mouth daily.        . promethazine (PHENERGAN) 25 MG tablet Take 25 mg by mouth every 4 (four) hours as needed.        . chlorpheniramine-HYDROcodone (TUSSIONEX PENNKINETIC ER) 10-8 MG/5ML LQCR Take 5 mLs by mouth every 12 (twelve) hours as needed.       Marland Kitchen DISCONTD: LIPITOR 10  MG tablet TAKE ONE TABLET BY MOUTH DAILY.  30 tablet  11    Allergies  Allergen Reactions  . Aspirin   . Azithromycin     REACTION: pt states "ZPak doesn't work"  . Ciprofloxacin   . Codeine   . Levofloxacin     REACTION: chest pain  . Oxycodone-Acetaminophen     REACTION: vomiting  . Tramadol Hcl     REACTION: pt states "spaced-out"    Current Medications, Allergies, Past Medical History, Past Surgical History, Family History, and Social History were reviewed in [employer]  electronic medical record.    Review of Systems         See HPI - all other systems neg except as noted...  The patient complains of weight gain, decreased hearing, chest pain, dyspnea on exertion, muscle weakness, and difficulty walking.  The patient denies anorexia, fever, weight loss, vision loss, hoarseness, syncope, peripheral edema, prolonged cough, headaches, hemoptysis, abdominal pain, melena, hematochezia, severe indigestion/heartburn, hematuria, incontinence, suspicious skin lesions, transient blindness, depression, unusual weight change, abnormal bleeding, enlarged lymph nodes, and angioedema.     Objective:   Physical Exam      WD, Obese, 70 y/o BF in NAD... c/o pain in her left lat chest wall> no rash present. GENERAL:  Alert & oriented; pleasant & cooperative... HEENT:  [place]/AT, EOM-wnl, PERRLA, EACs-clear, TMs-wnl, NOSE-clear, THROAT-clear & wnl. NECK:  Supple w/ fairROM; no JVD; normal carotid impulses w/o bruits; no thyromegaly or nodules palpated; no lymphadenopathy. CHEST:  Clear to P & A; without wheezes/ rales/ or rhonchi... +trigger points along trapezius area and chest wall. HEART:  Regular Rhythm; without murmurs/ rubs/ or gallops heard... +tender to palp. ABDOMEN:  Obese, soft & nontender; normal bowel sounds; no organomegaly or masses detected. EXT: without deformities, mild arthritic changes; no varicose veins/ +venous insuffic/ tr edema. NEURO:  CN's intact;  no focal neuro  deficits... DERM:  no shingles rash found...   Assessment & Plan:   ASTHMA, Bronchiectasis, LLL nodule>  She refuses to take meds regularly & is encouraged to use the Nebulizer Bid, Mucinex Bid, drink plenty of fluids, etc... She will call for any breathing problems and will require OV for assessment...  HBP>  Control is adeq but her regimen complex & I bet compliance is poor; we discussed the need for an ACE or ARB w/ her DM & decided to change her Clonidine to Losartan...  CAD>  On Imdur & Plavix in addition to above BP meds; [facility] by [facility] & felt to have mostly CWP from her FM & no signif angina...  CHOL>  Stable on Lipitor 75m/d, but it would be great to lose some weight...  DM>  Stable on Metformin Bid, but desperately needs to lose the wt & we discussed diet + exercise...  OBESITY>  Wt up to 216# and we discussed wt reduction strategies...  GI> GERD on OMEP413md;  Ulc colitis on Asacol & Canasa followed by DrStark...  GU followed by DrWrenn and stable as well...  DJD/ FM>  Followed by DrVoytek on Relafen, Parafon, and Lorcet...  Vit D Defic>  She switched to OTC vit d supplement taking 5000 u daily...  Anxiety> on alprazolam Tid prn...  Anemia> stable off her Fe supplement at present...Marland KitchenMarland Kitchen

## 2011-01-03 ENCOUNTER — Telehealth: Payer: Self-pay | Admitting: Pulmonary Disease

## 2011-01-03 MED ORDER — TIZANIDINE HCL 4 MG PO TABS: 4.0000 mg | ORAL_TABLET | Freq: Three times a day (TID) | ORAL | Status: AC | PRN

## 2011-01-03 NOTE — Telephone Encounter (Signed)
Per SN----change to tizanidine 17m  1 po tid prn muscle spasms   #90 with 5 refills. thanks

## 2011-01-03 NOTE — Telephone Encounter (Signed)
Generic Robaxin is not covered by the pt's insurance. I spoke with [facility] at [phone]. Member ID # is [id number]. Ref # [id number]. The covered alternatives are:  Baclofen and Tizanidine. Pls advise on changing to one of the covered alternatives.

## 2011-01-03 NOTE — Telephone Encounter (Signed)
Pt is aware of new rx for tizanidine and this has been called to the pharmacy.

## 2011-01-14 ENCOUNTER — Encounter: Payer: Self-pay | Admitting: Pulmonary Disease

## 2011-01-15 ENCOUNTER — Other Ambulatory Visit: Payer: Self-pay | Admitting: Pulmonary Disease

## 2011-01-25 LAB — DIFFERENTIAL
Basophils Absolute: 0
Basophils Relative: 0
Eosinophils Absolute: 0.4
Eosinophils Relative: 4
Lymphocytes Relative: 18
Lymphs Abs: 1.6
Monocytes Absolute: 0.6
Monocytes Relative: 7
Neutro Abs: 6.2
Neutrophils Relative %: 70

## 2011-01-25 LAB — APTT: aPTT: 35

## 2011-01-25 LAB — CARDIAC PANEL(CRET KIN+CKTOT+MB+TROPI)
CK, MB: 1.3
Relative Index: INVALID
Total CK: 80

## 2011-01-25 LAB — PROTIME-INR
INR: 1
Prothrombin Time: 13.7

## 2011-01-25 LAB — CBC
HCT: 36.8
HCT: 37.1
Hemoglobin: 12.2
Hemoglobin: 12.6
MCHC: 33.8
MCHC: 34.2
MCV: 91.8
MCV: 92.1
Platelets: 394
Platelets: 456 — ABNORMAL HIGH
RBC: 4.01
RBC: 4.03
RDW: 14
RDW: 14.2
WBC: 10.1
WBC: 8.8

## 2011-01-25 LAB — COMPREHENSIVE METABOLIC PANEL
ALT: 8
Albumin: 3.7
BUN: 5 — ABNORMAL LOW
Calcium: 9.1
Glucose, Bld: 116 — ABNORMAL HIGH
Sodium: 140
Total Protein: 6.8

## 2011-01-25 LAB — POCT CARDIAC MARKERS
Myoglobin, poc: 40.2
Operator id: 146091

## 2011-01-25 LAB — BASIC METABOLIC PANEL
Calcium: 9
Creatinine, Ser: 0.49
GFR calc Af Amer: >60

## 2011-01-25 LAB — POCT I-STAT, CHEM 8
BUN: 9
Calcium, Ion: 1.22
Chloride: 104
Creatinine, Ser: 0.4
Glucose, Bld: 111 — ABNORMAL HIGH
HCT: 40
Hemoglobin: 13.6
Potassium: 3.6
Sodium: 140
TCO2: 28

## 2011-01-25 LAB — B-NATRIURETIC PEPTIDE (CONVERTED LAB)
Pro B Natriuretic peptide (BNP): 32
Pro B Natriuretic peptide (BNP): 32

## 2011-01-25 LAB — HEMOGLOBIN A1C
Hgb A1c MFr Bld: 6.6 — ABNORMAL HIGH
Mean Plasma Glucose: 158

## 2011-01-25 LAB — TSH: TSH: 1.002

## 2011-01-25 LAB — CK TOTAL AND CKMB (NOT AT ARMC): CK, MB: 2

## 2011-01-25 LAB — D-DIMER, QUANTITATIVE: D-Dimer, Quant: 0.36

## 2011-01-29 LAB — GLUCOSE, CAPILLARY
Glucose-Capillary: 127 — ABNORMAL HIGH
Glucose-Capillary: 95

## 2011-01-31 LAB — CBC
Hemoglobin: 11.3 — ABNORMAL LOW
MCHC: 32.6
RDW: 13.7

## 2011-02-22 ENCOUNTER — Telehealth: Payer: Self-pay | Admitting: Cardiology

## 2011-02-22 NOTE — Telephone Encounter (Signed)
New message;  Pt needs an epidural injection and they need a note faxed to the office stating ok for her to be off of Plavix for 7 days.  Fax number is [phone].

## 2011-02-22 NOTE — Telephone Encounter (Signed)
Pt is not scheduled yet for the epidural injection.  They hope to do it within 2 weeks.  Dr Laroy Apple will be doing it and pt needs a written clearance to be off the Plavix for 7 days.

## 2011-02-22 NOTE — Telephone Encounter (Signed)
OK  To be off Plavix for 7 days for procedure

## 2011-02-26 ENCOUNTER — Encounter: Payer: Self-pay | Admitting: Cardiology

## 2011-02-26 ENCOUNTER — Ambulatory Visit (INDEPENDENT_AMBULATORY_CARE_PROVIDER_SITE_OTHER): Payer: Medicare Other | Admitting: Cardiology

## 2011-02-26 DIAGNOSIS — E8779 Other fluid overload: Secondary | ICD-10-CM

## 2011-02-26 DIAGNOSIS — I509 Heart failure, unspecified: Secondary | ICD-10-CM

## 2011-02-26 DIAGNOSIS — E8770 Fluid overload, unspecified: Secondary | ICD-10-CM

## 2011-02-26 DIAGNOSIS — I2510 Atherosclerotic heart disease of native coronary artery without angina pectoris: Secondary | ICD-10-CM

## 2011-02-26 DIAGNOSIS — I10 Essential (primary) hypertension: Secondary | ICD-10-CM

## 2011-02-26 NOTE — Assessment & Plan Note (Signed)
Coronary disease is stable.  No further workup.

## 2011-02-26 NOTE — Progress Notes (Signed)
HPI Patient is seen today for cardiology followup.  I saw her last April, 2012.  She has known coronary disease.  She is allergic to aspirin.  She uses Plavix.  She's not having any chest pain.  She has some shortness of breath but this is stable.  She's not had any marked volume overload.  She's had some difficulties with back pain and will be an epidural.  It will be okay to hold her Plavix for this. Allergies  Allergen Reactions  . Aspirin   . Azithromycin     REACTION: pt states "ZPak doesn't work"  . Ciprofloxacin   . Codeine   . Levofloxacin     REACTION: chest pain  . Oxycodone-Acetaminophen     REACTION: vomiting  . Tramadol Hcl     REACTION: pt states "spaced-out"    Current Outpatient Prescriptions  Medication Sig Dispense Refill  . albuterol (PROVENTIL) (2.5 MG/3ML) 0.083% nebulizer solution Take 2.5 mg by nebulization as needed. USE IN NEBULIZER UP TO THREE TIME PER DAY AS NEEDED FOR WHEEZING.       Marland Kitchen albuterol (VENTOLIN HFA) 108 (90 BASE) MCG/ACT inhaler Inhale 2 puffs into the lungs as directed.        Marland Kitchen ALPRAZolam (XANAX) 0.5 MG tablet Take 1 tablet (0.5 mg total) by mouth 2 (two) times daily as needed for anxiety.  60 tablet  5  . amLODipine (NORVASC) 10 MG tablet TAKE 1 TABLET EVERY DAY  34 tablet  5  . atenolol (TENORMIN) 50 MG tablet TAKE 1 TABLET EVERY DAY  30 tablet  5  . atorvastatin (LIPITOR) 10 MG tablet Take 1 tablet (10 mg total) by mouth daily.  30 tablet  11  . Cholecalciferol (VITAMIN D-3) 5000 UNITS TABS Take 5,000 Units by mouth daily.        . clopidogrel (PLAVIX) 75 MG tablet Take 1 tablet (75 mg total) by mouth daily.  30 tablet  9  . fluticasone (FLONASE) 50 MCG/ACT nasal spray 2 sprays by Nasal route 2 (two) times daily.        . furosemide (LASIX) 40 MG tablet TAKE 2 TABLETS IN THE IN THE MORNING, AND TAKE 1 TABLET BY MOUTH IN THE EVENING  90 tablet  6  . hydrocodone-acetaminophen (LORCET-HD) 5-500 MG per capsule Take 1 capsule by mouth 2 (two)  times daily as needed for pain. As needed for pain  60 capsule  5  . hydrocortisone-pramoxine (ANALPRAM-HC) 2.5-1 % rectal cream Place rectally 2 (two) times daily. APPLY A SMALL AMOUNT TO AFFECTED AREA.       . hydrOXYzine (ATARAX) 25 MG tablet Take 25 mg by mouth every 4 (four) hours as needed.        . isosorbide mononitrate (IMDUR) 60 MG 24 hr tablet TAKE 1/2 TABLET BY MOUTH DAILY  15 tablet  5  . KLOR-CON M20 20 MEQ tablet TAKE 1 TABLET BY MOUTH TWICE A DAY  60 tablet  1  . losartan (COZAAR) 50 MG tablet Take 1 tablet (50 mg total) by mouth daily. For blood pressure  30 tablet  11  . mesalamine (ASACOL) 400 MG EC tablet Take 4 tablets (1,600 mg total) by mouth 3 (three) times daily.  360 tablet  11  . mesalamine (CANASA) 1000 MG suppository Place 1 suppository (1,000 mg total) rectally at bedtime.  30 suppository  11  . metFORMIN (GLUCOPHAGE) 500 MG tablet TAKE 1 TABLET BY MOUTH TWICE A DAY  60 tablet  5  .  nabumetone (RELAFEN) 750 MG tablet TAKE 2 TABLETS BY MOUTH WITH FOOD DAILY AS NEEDED  60 tablet  5  . NITROSTAT 0.4 MG SL tablet 1 TABLET UNDER TONGUE AT ONSET OF CHEST PAIN YOU MAY REPEAT EVERY 5 MINUTES FOR UP TO 3 DOSES.  25 tablet  6  . nystatin (MYCOSTATIN) 100000 UNIT/ML suspension GARGLE & SWALLOW 1 TEASPOONFUL 4 TIMES A DAY  120 mL  5  . omeprazole (PRILOSEC) 40 MG capsule Take 40 mg by mouth daily.        . promethazine (PHENERGAN) 25 MG tablet Take 1 tablet (25 mg total) by mouth 2 (two) times daily as needed. For nausea  60 tablet  5  . SINGULAIR 10 MG tablet TAKE 1 TABLET EVERY DAY  30 tablet  5  . chlorpheniramine-HYDROcodone (TUSSIONEX PENNKINETIC ER) 10-8 MG/5ML LQCR Take 5 mLs by mouth every 12 (twelve) hours as needed.       Marland Kitchen guaiFENesin (MUCINEX) 600 MG 12 hr tablet 600 mg. AS NEEDED        History   Social History  . Marital Status: Divorced    Spouse Name: N/A    Number of Children: N/A  . Years of Education: N/A   Occupational History  . Retired    Social  History Main Topics  . Smoking status: Former Smoker -- 2.0 packs/day for 15 years    Types: Cigarettes    Quit date: 04/30/1973  . Smokeless tobacco: Never Used   Comment: ex-smoker: smoked for 10-15 years up to 2ppd, quit 1975.  Marland Kitchen Alcohol Use: Yes     Social drinker  . Drug Use: No  . Sexually Active: Not on file   Other Topics Concern  . Not on file   Social History Narrative  . No narrative on file    Family History  Problem Relation Age of Onset  . Pneumonia Father   . Heart attack Father   . Parkinsonism Mother   . Sarcoidosis Brother   . Hypertension Sister   . Diabetes Sister   . Colon cancer Neg Hx     Past Medical History  Diagnosis Date  . CHF (congestive heart failure)     Diastolic  . Volume overload   . Bronchiectasis   . Hypertension   . CAD (coronary artery disease)     Stent to the LAD, 2002 / catheterization 2009 LAD stents patent, other mild disease  . Dyslipidemia   . Diabetes mellitus   . Overweight   . GERD (gastroesophageal reflux disease)   . Fibromyalgia   . Anxiety   . Allergy, unspecified not elsewhere classified   . Pulmonary nodule   . Other diseases of lung, not elsewhere classified   . Other chest pain   . Hypercholesterolemia   . Diverticulosis of colon   . Ulcerative colitis, left sided 2008    Hx of  . Adenomatous colon polyp 02/2006  . Microscopic hematuria   . Neoplasm of kidney   . Stress incontinence, female   . DJD (degenerative joint disease)   . Vitamin D deficiency disease   . Anemia   . Asthma     Past Surgical History  Procedure Date  . Cholecystectomy   . Hand surgery 01/2006    Right Hand Surgery by Dr. Amedeo Plenty  . Ptca     Stent  . Abdominal hysterectomy     ROS  Patient denies fever, chills, headache, sweats, rash, change in vision, change in hearing, chest  pain, cough, nausea vomiting, urinary symptoms.  All of the systems are reviewed and are negative. PHYSICAL EXAM Patient is overweight.  She  is stable.  Head is atraumatic.  There is no jugular venous distention.  Lungs are clear.  Respiratory effort is not labored.  Cardiac exam reveals S1-S2.  No clicks or significant murmurs.  Abdomen is soft there's no peripheral edema. Filed Vitals:   02/26/11 0859  BP: 138/66  Pulse: 74  Resp: 20  Height: _0  (1.575 m)  Weight: 196 lb 6.4 oz (89.086 kg)    EKG is done today and reviewed by me.  There is normal sinus rhythm.  ASSESSMENT & PLAN

## 2011-02-26 NOTE — Assessment & Plan Note (Signed)
Volume status stable.  No change in therapy.

## 2011-02-26 NOTE — Assessment & Plan Note (Signed)
Blood pressure stable.  No change in therapy.  We'll see her back in 6 months.  She is allowed to hold her Plavix to have an epidural injection.  This can be held for 5-7 days and then restarted.

## 2011-02-26 NOTE — Patient Instructions (Signed)
Your physician wants you to follow-up in: 6 months.   You will receive a reminder letter in the mail two months in advance. If you don't receive a letter, please call our office to schedule the follow-up appointment.  You may stop your Plavix for 5-7 days prior to your injection

## 2011-03-05 ENCOUNTER — Other Ambulatory Visit: Payer: Self-pay | Admitting: Cardiology

## 2011-03-23 ENCOUNTER — Other Ambulatory Visit: Payer: Self-pay | Admitting: Pulmonary Disease

## 2011-03-24 ENCOUNTER — Other Ambulatory Visit: Payer: Self-pay | Admitting: Pulmonary Disease

## 2011-03-27 ENCOUNTER — Telehealth: Payer: Self-pay | Admitting: Pulmonary Disease

## 2011-03-27 MED ORDER — AMOXICILLIN-POT CLAVULANATE 875-125 MG PO TABS: 1.0000 | ORAL_TABLET | Freq: Two times a day (BID) | ORAL | Status: DC

## 2011-03-27 NOTE — Telephone Encounter (Signed)
Called and spoke with pt and she stated that she has headache, green mucus with some blood at times, she has been using mucinex, nebs, and mmw but is requesting an abx to clear this up.  SN please advise. Thanks  Allergies  Allergen Reactions  . Aspirin   . Azithromycin     REACTION: pt states "ZPak doesn't work"  . Ciprofloxacin   . Codeine   . Levofloxacin     REACTION: chest pain  . Oxycodone-Acetaminophen     REACTION: vomiting  . Tramadol Hcl     REACTION: pt states "spaced-out"

## 2011-03-27 NOTE — Telephone Encounter (Signed)
Per SN---ok for pt to have augmentin 82m  #20  1 po bid until gone.  Called and spoke with pt and she is aware that this has been sent to the pharmacy

## 2011-03-29 ENCOUNTER — Other Ambulatory Visit: Payer: Self-pay | Admitting: Pulmonary Disease

## 2011-04-09 ENCOUNTER — Telehealth: Payer: Self-pay | Admitting: Cardiology

## 2011-04-09 NOTE — Telephone Encounter (Signed)
New msg Pt has been having chest pain over last several days some sob none today please call

## 2011-04-09 NOTE — Telephone Encounter (Signed)
Called patient back. She advised me that she had an episode of chest pain on Friday relieved with 2 NTG. She states that she felt fine on Saturday and Sunday. Today she had 1 episode of chest pain relieved with 1 NTG. Patient states that she would like to be seen this week and will not go to the ER. I advised her to see Truitt Merle NP tomorrow am at 8:15.

## 2011-04-10 ENCOUNTER — Other Ambulatory Visit: Payer: Self-pay

## 2011-04-10 ENCOUNTER — Encounter: Payer: Self-pay | Admitting: Nurse Practitioner

## 2011-04-10 ENCOUNTER — Ambulatory Visit (INDEPENDENT_AMBULATORY_CARE_PROVIDER_SITE_OTHER): Payer: Medicare Other | Admitting: Nurse Practitioner

## 2011-04-10 DIAGNOSIS — I10 Essential (primary) hypertension: Secondary | ICD-10-CM

## 2011-04-10 DIAGNOSIS — E876 Hypokalemia: Secondary | ICD-10-CM

## 2011-04-10 DIAGNOSIS — I2510 Atherosclerotic heart disease of native coronary artery without angina pectoris: Secondary | ICD-10-CM

## 2011-04-10 DIAGNOSIS — R079 Chest pain, unspecified: Secondary | ICD-10-CM

## 2011-04-10 DIAGNOSIS — I509 Heart failure, unspecified: Secondary | ICD-10-CM

## 2011-04-10 LAB — CBC WITH DIFFERENTIAL/PLATELET
Basophils Absolute: 0 10*3/uL (ref 0.0–0.1)
Basophils Relative: 0.3 % (ref 0.0–3.0)
Eosinophils Absolute: 0.4 10*3/uL (ref 0.0–0.7)
Eosinophils Relative: 3.7 % (ref 0.0–5.0)
HCT: 36.3 % (ref 36.0–46.0)
Hemoglobin: 12.2 g/dL (ref 12.0–15.0)
Lymphocytes Relative: 13.2 % (ref 12.0–46.0)
Lymphs Abs: 1.4 10*3/uL (ref 0.7–4.0)
MCHC: 33.5 g/dL (ref 30.0–36.0)
MCV: 93.4 fl (ref 78.0–100.0)
Monocytes Absolute: 0.5 10*3/uL (ref 0.1–1.0)
Monocytes Relative: 4.3 % (ref 3.0–12.0)
Neutro Abs: 8.3 10*3/uL — ABNORMAL HIGH (ref 1.4–7.7)
Neutrophils Relative %: 78.5 % — ABNORMAL HIGH (ref 43.0–77.0)
Platelets: 445 10*3/uL — ABNORMAL HIGH (ref 150.0–400.0)
RBC: 3.89 Mil/uL (ref 3.87–5.11)
RDW: 14.2 % (ref 11.5–14.6)
WBC: 10.6 10*3/uL — ABNORMAL HIGH (ref 4.5–10.5)

## 2011-04-10 LAB — BASIC METABOLIC PANEL
BUN: 10 mg/dL (ref 6–23)
CO2: 32 mEq/L (ref 19–32)
Calcium: 8.6 mg/dL (ref 8.4–10.5)
Chloride: 102 mEq/L (ref 96–112)
Creatinine, Ser: 0.5 mg/dL (ref 0.4–1.2)
GFR: 144.08 mL/min (ref >60.00)
Glucose, Bld: 165 mg/dL — ABNORMAL HIGH (ref 70–99)
Potassium: 3.1 mEq/L — ABNORMAL LOW (ref 3.5–5.1)
Sodium: 142 mEq/L (ref 135–145)

## 2011-04-10 LAB — CK TOTAL AND CKMB (NOT AT ARMC)
CK, MB: 1.7 ng/mL (ref 0.3–4.0)
Relative Index: 1.7 (ref 0.0–2.5)
Total CK: 100 U/L (ref 7–177)

## 2011-04-10 LAB — TROPONIN I: Troponin I: <0.3 ng/mL (ref <0.30)

## 2011-04-10 MED ORDER — ISOSORBIDE MONONITRATE ER 60 MG PO TB24: 60.0000 mg | ORAL_TABLET | Freq: Every day | ORAL | Status: DC

## 2011-04-10 NOTE — Assessment & Plan Note (Signed)
Blood pressure looks good and she reports satisfactory control at home.

## 2011-04-10 NOTE — Assessment & Plan Note (Signed)
Appears compensated. No other change in her medicines.

## 2011-04-10 NOTE — Patient Instructions (Signed)
We are going to check some labs today.  We will arrange for a stress test this week.  Increase your Isosorbide to a whole tablet each day.  Use your NTG under your tongue for recurrent chest pain. May take one tablet every 5 minutes. If you are still having discomfort after 3 tablets in 15 minutes, call 911.  I will see you back next week.  Call the [facility] office at [phone] if you have any questions, problems or concerns.

## 2011-04-10 NOTE — Progress Notes (Signed)
[name] Date of Birth: 05-07-1940 Medical Record #[id number]  History of Present Illness: [name] is seen today for a work in visit today. She is seen for Dr. Ron Parker. She has a history of known CAD with prior BMS to the LAD in 2002. Last cath in 2009 showing patency of the stent and mild disease otherwise. She has been managed medically. Other problems include HTN, DM, hyperlipidemia, obesity and anxiety. She was last here in October with Dr. Ron Parker and felt to be doing ok.  Today she comes in for complaint of chest pain. She notes that she has had a feeling of "needles sticking" her this past Friday night. It was moving around in her chest. Occurred without exertion. No associated symptoms.  She has been under more stress with a daughter dealing with breast cancer and a son with recent TKR that she is trying to help care for. It will only last for a few seconds. She felt like she has overexerted and doing some extra lifting. She did take some NTG and went on to sleep Friday night. She has not had any shortness of breath which she states is how she presented prior to her stent placement. She did fine on Saturday and Sunday. Went to church. Not able to exercise due to back issues. She had a recurrent spell yesterday but did not wish to go to the ER. She wants to try increasing her medicine and does not wish to go to the hospital.   Current Outpatient Prescriptions on File Prior to Visit  Medication Sig Dispense Refill  . albuterol (PROVENTIL) (2.5 MG/3ML) 0.083% nebulizer solution USE IN NEBULIZER UP TO THREE TIMES PER DAY AS NEEDED FOR WHEEZING...  300 mL  1  . albuterol (VENTOLIN HFA) 108 (90 BASE) MCG/ACT inhaler Inhale 2 puffs into the lungs as directed.        Marland Kitchen amLODipine (NORVASC) 10 MG tablet TAKE 1 TABLET EVERY DAY  34 tablet  5  . atenolol (TENORMIN) 50 MG tablet TAKE 1 TABLET EVERY DAY  30 tablet  5  . atorvastatin (LIPITOR) 10 MG tablet Take 1 tablet (10 mg total) by mouth daily.  30  tablet  11  . Cholecalciferol (VITAMIN D-3) 5000 UNITS TABS Take 5,000 Units by mouth daily.        . clopidogrel (PLAVIX) 75 MG tablet Take 1 tablet (75 mg total) by mouth daily.  30 tablet  9  . fluticasone (FLONASE) 50 MCG/ACT nasal spray 2 sprays by Nasal route 2 (two) times daily.        . furosemide (LASIX) 40 MG tablet TAKE 2 TABLETS IN THE IN THE MORNING, AND TAKE 1 TABLET BY MOUTH IN THE EVENING  90 tablet  6  . guaiFENesin (MUCINEX) 600 MG 12 hr tablet 600 mg. AS NEEDED      . hydrocodone-acetaminophen (LORCET-HD) 5-500 MG per capsule Take 1 capsule by mouth 2 (two) times daily as needed for pain. As needed for pain  60 capsule  5  . hydrocortisone-pramoxine (ANALPRAM-HC) 2.5-1 % rectal cream Place rectally as needed. APPLY A SMALL AMOUNT TO AFFECTED AREA.      . hydrOXYzine (ATARAX) 25 MG tablet Take 25 mg by mouth every 4 (four) hours as needed.        . isosorbide mononitrate (IMDUR) 60 MG 24 hr tablet TAKE 1/2 TABLET BY MOUTH DAILY  15 tablet  5  . losartan (COZAAR) 50 MG tablet Take 1 tablet (50 mg total) by  mouth daily. For blood pressure  30 tablet  11  . mesalamine (ASACOL) 400 MG EC tablet Take 4 tablets (1,600 mg total) by mouth 3 (three) times daily.  360 tablet  11  . metFORMIN (GLUCOPHAGE) 500 MG tablet TAKE 1 TABLET BY MOUTH TWICE A DAY  60 tablet  5  . montelukast (SINGULAIR) 10 MG tablet TAKE 1 TABLET AT BEDTIME  30 tablet  3  . nabumetone (RELAFEN) 750 MG tablet TAKE 2 TABLETS BY MOUTH WITH FOOD DAILY AS NEEDED  60 tablet  5  . NITROSTAT 0.4 MG SL tablet 1 TABLET UNDER TONGUE AT ONSET OF CHEST PAIN YOU MAY REPEAT EVERY 5 MINUTES FOR UP TO 3 DOSES.  25 tablet  6  . nystatin (MYCOSTATIN) 100000 UNIT/ML suspension GARGLE & SWALLOW 1 TEASPOONFUL 4 TIMES A DAY  120 mL  5  . omeprazole (PRILOSEC) 40 MG capsule Take 40 mg by mouth daily.        . promethazine (PHENERGAN) 25 MG tablet Take 1 tablet (25 mg total) by mouth 2 (two) times daily as needed. For nausea  60 tablet  5  .  SINGULAIR 10 MG tablet TAKE 1 TABLET EVERY DAY  30 tablet  5    Allergies  Allergen Reactions  . Aspirin   . Azithromycin     REACTION: pt states "ZPak doesn't work"  . Ciprofloxacin   . Codeine   . Levofloxacin     REACTION: chest pain  . Oxycodone-Acetaminophen     REACTION: vomiting  . Tramadol Hcl     REACTION: pt states "spaced-out"    Past Medical History  Diagnosis Date  . CHF (congestive heart failure)     Diastolic  . Volume overload   . Bronchiectasis   . Hypertension   . CAD (coronary artery disease)     Stent to the LAD, 2002 / catheterization 2009 LAD stents patent, other mild disease  . Diabetes mellitus   . Overweight   . GERD (gastroesophageal reflux disease)   . Fibromyalgia   . Anxiety   . Allergy, unspecified not elsewhere classified   . Pulmonary nodule   . Other diseases of lung, not elsewhere classified   . Hypercholesterolemia   . Diverticulosis of colon   . Ulcerative colitis, left sided 2008    Hx of  . Adenomatous colon polyp 02/2006  . Microscopic hematuria   . Neoplasm of kidney   . Stress incontinence, female   . DJD (degenerative joint disease)   . Vitamin D deficiency disease   . Anemia   . Asthma     Past Surgical History  Procedure Date  . Cholecystectomy   . Hand surgery 01/2006    Right Hand Surgery by Dr. Amedeo Plenty  . Ptca     Stent  . Abdominal hysterectomy     History  Smoking status  . Former Smoker -- 2.0 packs/day for 15 years  . Types: Cigarettes  . Quit date: 04/30/1973  Smokeless tobacco  . Never Used  Comment: ex-smoker: smoked for 10-15 years up to 2ppd, quit 1975.    History  Alcohol Use  . Yes    Social drinker    Family History  Problem Relation Age of Onset  . Pneumonia Father   . Heart attack Father   . Parkinsonism Mother   . Sarcoidosis Brother   . Hypertension Sister   . Diabetes Sister   . Colon cancer Neg Hx     Review of Systems:  The review of systems is positive for back pain.  She has chronic issues with fatigue and joint aching. Not very active. Blood pressure at home is very good. She tolerates her medicines.  All other systems were reviewed and are negative.  Physical Exam: BP 126/82  Pulse 69  Ht _0  (1.575 m)  Wt 215 lb (97.523 kg)  BMI 39.32 kg/m2 Patient is very pleasant and in no acute distress. She is obese. Skin is warm and dry. Color is normal.  HEENT is unremarkable. Normocephalic/atraumatic. PERRL. Sclera are nonicteric. Neck is supple. No masses. No JVD. Lungs are clear. Cardiac exam shows a regular rate and rhythm. Abdomen is obese but soft. Extremities are full but without edema. Gait and ROM are intact. She is using a cane. No gross neurologic deficits noted.   LABORATORY DATA: EKG shows sinus rhythm. No acute changes.   Assessment / Plan:

## 2011-04-10 NOTE — Assessment & Plan Note (Signed)
Her symptoms seem atypical to me. Not like her prior chest pain syndrome. We will increase her Imdur to a whole tablet (13m) daily. We are checking labs today. We will update her stress test. I will see her back for follow up. She does have NTG on hand. Further disposition to follow once her studies are complete. Patient is agreeable to this plan and will call if any problems develop in the interim.

## 2011-04-11 ENCOUNTER — Ambulatory Visit (HOSPITAL_COMMUNITY): Payer: Medicare Other | Attending: Internal Medicine | Admitting: Radiology

## 2011-04-11 DIAGNOSIS — R42 Dizziness and giddiness: Secondary | ICD-10-CM | POA: Insufficient documentation

## 2011-04-11 DIAGNOSIS — I10 Essential (primary) hypertension: Secondary | ICD-10-CM | POA: Insufficient documentation

## 2011-04-11 DIAGNOSIS — Z87891 Personal history of nicotine dependence: Secondary | ICD-10-CM | POA: Insufficient documentation

## 2011-04-11 DIAGNOSIS — R61 Generalized hyperhidrosis: Secondary | ICD-10-CM | POA: Insufficient documentation

## 2011-04-11 DIAGNOSIS — Z8249 Family history of ischemic heart disease and other diseases of the circulatory system: Secondary | ICD-10-CM | POA: Insufficient documentation

## 2011-04-11 DIAGNOSIS — Z9861 Coronary angioplasty status: Secondary | ICD-10-CM | POA: Insufficient documentation

## 2011-04-11 DIAGNOSIS — R079 Chest pain, unspecified: Secondary | ICD-10-CM | POA: Insufficient documentation

## 2011-04-11 DIAGNOSIS — R0989 Other specified symptoms and signs involving the circulatory and respiratory systems: Secondary | ICD-10-CM | POA: Insufficient documentation

## 2011-04-11 DIAGNOSIS — E785 Hyperlipidemia, unspecified: Secondary | ICD-10-CM | POA: Insufficient documentation

## 2011-04-11 DIAGNOSIS — R0602 Shortness of breath: Secondary | ICD-10-CM

## 2011-04-11 DIAGNOSIS — I2510 Atherosclerotic heart disease of native coronary artery without angina pectoris: Secondary | ICD-10-CM | POA: Insufficient documentation

## 2011-04-11 DIAGNOSIS — E119 Type 2 diabetes mellitus without complications: Secondary | ICD-10-CM | POA: Insufficient documentation

## 2011-04-11 DIAGNOSIS — R0609 Other forms of dyspnea: Secondary | ICD-10-CM | POA: Insufficient documentation

## 2011-04-11 DIAGNOSIS — J45909 Unspecified asthma, uncomplicated: Secondary | ICD-10-CM | POA: Insufficient documentation

## 2011-04-11 MED ORDER — TECHNETIUM TC 99M TETROFOSMIN IV KIT
30.0000 | PACK | Freq: Once | INTRAVENOUS | Status: AC | PRN
  Administered 2011-04-11: 30 via INTRAVENOUS

## 2011-04-11 MED ORDER — TECHNETIUM TC 99M TETROFOSMIN IV KIT
10.0000 | PACK | Freq: Once | INTRAVENOUS | Status: AC | PRN
  Administered 2011-04-11: 10 via INTRAVENOUS

## 2011-04-11 MED ORDER — REGADENOSON 0.4 MG/5ML IV SOLN
0.4000 mg | Freq: Once | INTRAVENOUS | Status: AC
  Administered 2011-04-11: 0.4 mg via INTRAVENOUS

## 2011-04-11 NOTE — Progress Notes (Signed)
[place] [address] [phone]  Cardiology Nuclear Med Study  [name] is a 70 y.o. female [id number] [birth year]   Nuclear Med Background Indication for Stress Test:  Evaluation for Ischemia, Stent Patency and PTCA Patency History: 09' Angioplasty, Asthma,'94 Echo-NL EF,LVH,6/09 Heart Catheterization(patent stent NL EF;no CAD,09/13/06 Myocardial Perfusion Study-EF=64% NL,'02  Stents-LAD and CHF Cardiac Risk Factors: Family History - CAD, History of Smoking, Hypertension, Lipids and NIDDM  Symptoms:  Chest Pain, Chest Pain (last date of chest discomfort 04-10-11/ pain from right to left), Diaphoresis, Dizziness, DOE and Light-Headedness   Nuclear Pre-Procedure Caffeine/Decaff Intake:  None NPO After: 9:00am   Lungs:  clear IV 0.9% NS with Angio Cath:  22g  IV Site: L Antecubital  IV Started by:  Irven Baltimore, RN  Chest Size (in):  40 Cup Size: C  Height: _0  (1.575 m)  Weight:  216 lb (97.977 kg)  BMI:  Body mass index is 39.51 kg/(m^2). Tech Comments:  Patient held Metformin and Atenolol this AM    Nuclear Med Study 1 or 2 day study: 1 day  Stress Test Type:  Lexiscan  Reading MD: Mertie Moores, MD  Order Authorizing Provider:  Dola Argyle, MD  Resting Radionuclide: Technetium 71mTetrofosmin  Resting Radionuclide Dose: 10.6 mCi   Stress Radionuclide:  Technetium 968metrofosmin  Stress Radionuclide Dose: 33.0 mCi           Stress Protocol Rest HR: 77 Stress HR: 103  Rest BP: 132/60 Stress BP: 165/41  Exercise Time (min): n/a METS: n/a   Predicted Max HR: 152 bpm % Max HR: 67.76 bpm Rate Pressure Product: 16995   Dose of Adenosine (mg):  n/a Dose of Lexiscan: 0.4 mg  Dose of Atropine (mg): n/a Dose of Dobutamine: n/a mcg/kg/min (at max HR)  Stress Test Technologist: TeIleene HutchinsonEMT-P  Nuclear Technologist:  StCharlton AmorCNMT     Rest Procedure:  Myocardial perfusion imaging was  performed at rest 45 minutes following the intravenous administration of Technetium 9969mtrofosmin. Rest ECG: NSR  Stress Procedure:  The patient received IV Lexiscan 0.4 mg over 15-seconds.  Technetium 16m91mrofosmin injected at 30-seconds.  There were no significant changes with Lexiscan.  Quantitative spect images were obtained after a 45 minute delay. Stress ECG: No significant change from baseline ECG  QPS Raw Data Images:  There is a breast shadow that accounts for the anterior attenuation. Stress Images:  Mildly decreased uptake in the anterior wall Rest Images:  Normal homogeneous uptake in all areas of the myocardium. Subtraction (SDS):  Mild reversible defect in the anterior wall suspect this is due to breast attenuation seen on raw images. Cannot completely exclude ischemia. Transient Ischemic Dilatation (Normal <1.22):  1.15 Lung/Heart Ratio (Normal <0.45):  0.23  Quantitative Gated Spect Images QGS EDV:  91 ml QGS ESV:  22 ml QGS cine images:  NL LV Function; NL Wall Motion QGS EF: 76%  Impression Exercise Capacity:  Lexiscan with no exercise. BP Response:  n/a Clinical Symptoms:  n/a ECG Impression:  No significant ECG changes with Lexiscan. Comparison with Prior Nuclear Study: No significant change from previous study  Overall Impression:  Probably normal stress nuclear study.Mild reversible defect in the anterior wall likely due to breast attenuation seen on raw images. Cannot completely exclude ischemia   [name]2 PM

## 2011-04-17 ENCOUNTER — Encounter: Payer: Self-pay | Admitting: Gastroenterology

## 2011-04-17 ENCOUNTER — Ambulatory Visit (INDEPENDENT_AMBULATORY_CARE_PROVIDER_SITE_OTHER): Payer: Medicare Other | Admitting: Nurse Practitioner

## 2011-04-17 ENCOUNTER — Other Ambulatory Visit: Payer: Self-pay

## 2011-04-17 ENCOUNTER — Encounter: Payer: Self-pay | Admitting: Nurse Practitioner

## 2011-04-17 ENCOUNTER — Other Ambulatory Visit (INDEPENDENT_AMBULATORY_CARE_PROVIDER_SITE_OTHER): Payer: Medicare Other | Admitting: *Deleted

## 2011-04-17 VITALS — BP 118/70 | HR 72 | Ht 62.0 in | Wt 215.0 lb

## 2011-04-17 DIAGNOSIS — I2510 Atherosclerotic heart disease of native coronary artery without angina pectoris: Secondary | ICD-10-CM

## 2011-04-17 DIAGNOSIS — I509 Heart failure, unspecified: Secondary | ICD-10-CM

## 2011-04-17 DIAGNOSIS — E876 Hypokalemia: Secondary | ICD-10-CM

## 2011-04-17 DIAGNOSIS — I10 Essential (primary) hypertension: Secondary | ICD-10-CM

## 2011-04-17 DIAGNOSIS — I428 Other cardiomyopathies: Secondary | ICD-10-CM | POA: Insufficient documentation

## 2011-04-17 LAB — BASIC METABOLIC PANEL
BUN: 12 mg/dL (ref 6–23)
CO2: 27 mEq/L (ref 19–32)
Calcium: 8.7 mg/dL (ref 8.4–10.5)
Chloride: 105 mEq/L (ref 96–112)
Creatinine, Ser: 0.4 mg/dL (ref 0.4–1.2)
GFR: 203.7 mL/min (ref >60.00)
Glucose, Bld: 149 mg/dL — ABNORMAL HIGH (ref 70–99)
Potassium: 3.4 mEq/L — ABNORMAL LOW (ref 3.5–5.1)
Sodium: 141 mEq/L (ref 135–145)

## 2011-04-17 NOTE — Assessment & Plan Note (Signed)
I have discussed her case with Dr. Ron Parker. She is better clinically. We will continue with her current regimen. We will see her back in about 4 months. Exercise is encoouraged. We are rechecking her labs today. Patient is agreeable to this plan and will call if any problems develop in the interim.

## 2011-04-17 NOTE — Assessment & Plan Note (Signed)
Blood pressure looks good.

## 2011-04-17 NOTE — Patient Instructions (Signed)
We will recheck your potassium level.  I will have you see Dr. Ron Parker in about 4 months.  Call the [facility] office at [phone] if you have any questions, problems or concerns.

## 2011-04-17 NOTE — Assessment & Plan Note (Signed)
Lastest EF is 76%. She looks compensated. Will continue to monitor.

## 2011-04-17 NOTE — Progress Notes (Signed)
[name] Date of Birth: [birth year] Medical Record #[id number]  History of Present Illness: [name] is seen today for a follow up visit. She is seen for Dr. Ron Parker. She has known CAD with prior BMS to the LAD in 2002. Last cath in 2009 showing patency of the stent and mild disease otherwise.   I saw her a week ago with a complaint of chest pain that she described as "needles" sticking her. It was moving around in her chest. Was not exertional and had no other associated symptoms. We increased her Imdur and arranged for a repeat stress test. She is now on 60 mg of Imdur. Her myoview was felt to be normal. There was mild reversible defect in the anterior wall likely due to breast attenuation seen on raw images and ischemia could not be completely excluded.  She comes in today. She is doing well. Tolerating her medicines. We did note a low potassium level. She thought her losartan potassium was the supplement and had not been taking. She has restarted it. We will be rechecking a BMET today. She is trying to walk a little but remains limited by her back pain.   Current Outpatient Prescriptions on File Prior to Visit  Medication Sig Dispense Refill  . albuterol (PROVENTIL) (2.5 MG/3ML) 0.083% nebulizer solution USE IN NEBULIZER UP TO THREE TIMES PER DAY AS NEEDED FOR WHEEZING...  300 mL  1  . albuterol (VENTOLIN HFA) 108 (90 BASE) MCG/ACT inhaler Inhale 2 puffs into the lungs as directed.        Marland Kitchen ALPRAZolam (XANAX) 0.5 MG tablet Take 0.5 mg by mouth as needed.        Marland Kitchen amLODipine (NORVASC) 10 MG tablet TAKE 1 TABLET EVERY DAY  34 tablet  5  . atenolol (TENORMIN) 50 MG tablet TAKE 1 TABLET EVERY DAY  30 tablet  5  . atorvastatin (LIPITOR) 10 MG tablet Take 1 tablet (10 mg total) by mouth daily.  30 tablet  11  . celecoxib (CELEBREX) 200 MG capsule Take 200 mg by mouth daily.        . Cholecalciferol (VITAMIN D-3) 5000 UNITS TABS Take 5,000 Units by mouth daily.        . clopidogrel (PLAVIX)  75 MG tablet Take 1 tablet (75 mg total) by mouth daily.  30 tablet  9  . fluticasone (FLONASE) 50 MCG/ACT nasal spray 2 sprays by Nasal route 2 (two) times daily.        . furosemide (LASIX) 40 MG tablet TAKE 2 TABLETS IN THE IN THE MORNING, AND TAKE 1 TABLET BY MOUTH IN THE EVENING  90 tablet  6  . guaiFENesin (MUCINEX) 600 MG 12 hr tablet 600 mg. AS NEEDED      . hydrocodone-acetaminophen (LORCET-HD) 5-500 MG per capsule Take 1 capsule by mouth 2 (two) times daily as needed for pain. As needed for pain  60 capsule  5  . hydrocortisone-pramoxine (ANALPRAM-HC) 2.5-1 % rectal cream Place rectally as needed. APPLY A SMALL AMOUNT TO AFFECTED AREA.      . hydrOXYzine (ATARAX) 25 MG tablet Take 25 mg by mouth every 4 (four) hours as needed.        . isosorbide mononitrate (IMDUR) 60 MG 24 hr tablet Take 1 tablet (60 mg total) by mouth daily.  30 tablet  6  . losartan (COZAAR) 50 MG tablet Take 1 tablet (50 mg total) by mouth daily. For blood pressure  30 tablet  11  .  mesalamine (ASACOL) 400 MG EC tablet Take 4 tablets (1,600 mg total) by mouth 3 (three) times daily.  360 tablet  11  . mesalamine (CANASA) 1000 MG suppository Place 1,000 mg rectally as needed.        . metFORMIN (GLUCOPHAGE) 500 MG tablet TAKE 1 TABLET BY MOUTH TWICE A DAY  60 tablet  5  . nabumetone (RELAFEN) 750 MG tablet TAKE 2 TABLETS BY MOUTH WITH FOOD DAILY AS NEEDED  60 tablet  5  . NITROSTAT 0.4 MG SL tablet 1 TABLET UNDER TONGUE AT ONSET OF CHEST PAIN YOU MAY REPEAT EVERY 5 MINUTES FOR UP TO 3 DOSES.  25 tablet  6  . nystatin (MYCOSTATIN) 100000 UNIT/ML suspension GARGLE & SWALLOW 1 TEASPOONFUL 4 TIMES A DAY  120 mL  5  . omeprazole (PRILOSEC) 40 MG capsule Take 40 mg by mouth daily.        . promethazine (PHENERGAN) 25 MG tablet Take 1 tablet (25 mg total) by mouth 2 (two) times daily as needed. For nausea  60 tablet  5  . amoxicillin-clavulanate (AUGMENTIN) 875-125 MG per tablet Take 1 tablet by mouth as needed.           Allergies  Allergen Reactions  . Aspirin   . Azithromycin     REACTION: pt states "ZPak doesn't work"  . Ciprofloxacin   . Codeine   . Levofloxacin     REACTION: chest pain  . Oxycodone-Acetaminophen     REACTION: vomiting  . Tramadol Hcl     REACTION: pt states "spaced-out"    Past Medical History  Diagnosis Date  . CHF (congestive heart failure)     Diastolic  . Volume overload   . Bronchiectasis   . Hypertension   . CAD (coronary artery disease)     BMS to the LAD, 2002 / catheterization 2009 LAD stents patent with otherwise mild disease; Managed medically.  . Diabetes mellitus   . Obesity   . GERD (gastroesophageal reflux disease)   . Fibromyalgia   . Anxiety   . Allergy, unspecified not elsewhere classified   . Pulmonary nodule     Negative PET in 2010  . Other diseases of lung, not elsewhere classified   . Hypercholesterolemia   . Diverticulosis of colon   . Ulcerative colitis, left sided 2008    Hx of  . Adenomatous colon polyp 02/2006  . Microscopic hematuria   . Neoplasm of kidney   . Stress incontinence, female   . DJD (degenerative joint disease)   . Vitamin D deficiency disease   . Anemia   . Asthma     Past Surgical History  Procedure Date  . Cholecystectomy   . Hand surgery 01/2006    Right Hand Surgery by Dr. Amedeo Plenty  . Coronary stent placement 2002    BMS to the LAD  . Abdominal hysterectomy     History  Smoking status  . Former Smoker -- 2.0 packs/day for 15 years  . Types: Cigarettes  . Quit date: 04/30/1973  Smokeless tobacco  . Never Used  Comment: ex-smoker: smoked for 10-15 years up to 2ppd, quit 1975.    History  Alcohol Use  . Yes    Social drinker    Family History  Problem Relation Age of Onset  . Pneumonia Father   . Heart attack Father   . Parkinsonism Mother   . Sarcoidosis Brother   . Hypertension Sister   . Diabetes Sister   . Colon  cancer Neg Hx     Review of Systems: The review of systems is per  the HPI.  All other systems were reviewed and are negative.  Physical Exam: Ht _0  (1.575 m)  Wt 215 lb (97.523 kg)  BMI 39.32 kg/m2 Patient is very pleasant and in no acute distress. She is obese. Skin is warm and dry. Color is normal.  HEENT is unremarkable. Normocephalic/atraumatic. PERRL. Sclera are nonicteric. Neck is supple. No masses. No JVD. Lungs are clear. Cardiac exam shows a regular rate and rhythm. Abdomen is obese but soft. Extremities are without edema. Gait and ROM are intact. No gross neurologic deficits noted.   LABORATORY DATA: BMET is pending   Assessment / Plan:

## 2011-04-27 ENCOUNTER — Other Ambulatory Visit: Payer: Medicare Other | Admitting: *Deleted

## 2011-04-28 ENCOUNTER — Other Ambulatory Visit: Payer: Self-pay | Admitting: Cardiology

## 2011-04-30 ENCOUNTER — Other Ambulatory Visit (INDEPENDENT_AMBULATORY_CARE_PROVIDER_SITE_OTHER): Payer: Medicare Other | Admitting: *Deleted

## 2011-04-30 ENCOUNTER — Other Ambulatory Visit: Payer: Self-pay

## 2011-04-30 DIAGNOSIS — I509 Heart failure, unspecified: Secondary | ICD-10-CM

## 2011-04-30 LAB — BASIC METABOLIC PANEL
BUN: 11 mg/dL (ref 6–23)
CO2: 26 mEq/L (ref 19–32)
Calcium: 8.9 mg/dL (ref 8.4–10.5)
Chloride: 102 mEq/L (ref 96–112)
Creatinine, Ser: 0.3 mg/dL — ABNORMAL LOW (ref 0.4–1.2)
GFR: 254.3 mL/min (ref >60.00)
Glucose, Bld: 154 mg/dL — ABNORMAL HIGH (ref 70–99)
Potassium: 3.5 mEq/L (ref 3.5–5.1)
Sodium: 137 mEq/L (ref 135–145)

## 2011-04-30 MED ORDER — FUROSEMIDE 40 MG PO TABS: 40.0000 mg | ORAL_TABLET | Freq: Three times a day (TID) | ORAL | Status: DC

## 2011-05-03 ENCOUNTER — Other Ambulatory Visit: Payer: Self-pay | Admitting: Pulmonary Disease

## 2011-05-08 ENCOUNTER — Telehealth: Payer: Self-pay | Admitting: Pulmonary Disease

## 2011-05-08 MED ORDER — AMOXICILLIN-POT CLAVULANATE 875-125 MG PO TABS: 1.0000 | ORAL_TABLET | Freq: Two times a day (BID) | ORAL | Status: DC

## 2011-05-08 MED ORDER — PREDNISONE (PAK) 5 MG PO TABS: ORAL_TABLET | ORAL | Status: DC

## 2011-05-08 NOTE — Telephone Encounter (Signed)
Pt is aware of SN recs. Rx's have been called into the pharmacy adn pt aware of the directions.

## 2011-05-08 NOTE — Telephone Encounter (Signed)
I spoke with pt and she c/o dry cough, increase SOB, nasal congestion, wheezing, sore throat (using MMW), nasal congestion, PND, unknown fever x 2 days. Pt states she is taking mucinex, allegra, saline spray, flonase and using her neb meds. Pt is requesting to have something called in for her. Please advise Dr. Lenna Gilford, thanks  Allergies  Allergen Reactions  . Aspirin   . Azithromycin     REACTION: pt states "ZPak doesn't work"  . Ciprofloxacin   . Codeine   . Levofloxacin     REACTION: chest pain  . Oxycodone-Acetaminophen     REACTION: vomiting  . Tramadol Hcl     REACTION: pt states "spaced-out"    [facility]

## 2011-05-08 NOTE — Telephone Encounter (Signed)
Per SN---call in augmentin 821m  #14  1 po bid and pred dosepak  5 mg   6 day pack take as directed.  thanks

## 2011-06-14 ENCOUNTER — Other Ambulatory Visit: Payer: Self-pay | Admitting: Pulmonary Disease

## 2011-06-19 ENCOUNTER — Other Ambulatory Visit: Payer: Self-pay | Admitting: Pulmonary Disease

## 2011-06-20 ENCOUNTER — Telehealth: Payer: Self-pay | Admitting: Cardiology

## 2011-06-20 DIAGNOSIS — M5137 Other intervertebral disc degeneration, lumbosacral region: Secondary | ICD-10-CM | POA: Diagnosis not present

## 2011-06-20 DIAGNOSIS — M48061 Spinal stenosis, lumbar region without neurogenic claudication: Secondary | ICD-10-CM | POA: Diagnosis not present

## 2011-06-20 NOTE — Telephone Encounter (Signed)
Pt needs to stop plavix for 7 days, pls call with ok

## 2011-06-20 NOTE — Telephone Encounter (Signed)
Note with ok was faxed

## 2011-06-20 NOTE — Telephone Encounter (Signed)
Dr Ron Agee from [facility],  is planning a transforaminal epidural steroid injection L4, L5.  They want to know if it is ok for [name] to stop her Plavix for 7 days prior to the injection.

## 2011-06-20 NOTE — Telephone Encounter (Signed)
[name] Plavix can be held for 7 days before an injection. He can then be restarted afterward.

## 2011-06-28 DIAGNOSIS — M48061 Spinal stenosis, lumbar region without neurogenic claudication: Secondary | ICD-10-CM | POA: Diagnosis not present

## 2011-07-04 ENCOUNTER — Ambulatory Visit: Payer: Medicare Other | Admitting: Pulmonary Disease

## 2011-07-12 ENCOUNTER — Other Ambulatory Visit: Payer: Self-pay | Admitting: Cardiology

## 2011-07-12 DIAGNOSIS — M48061 Spinal stenosis, lumbar region without neurogenic claudication: Secondary | ICD-10-CM | POA: Diagnosis not present

## 2011-07-12 DIAGNOSIS — M47817 Spondylosis without myelopathy or radiculopathy, lumbosacral region: Secondary | ICD-10-CM | POA: Diagnosis not present

## 2011-07-13 NOTE — Telephone Encounter (Signed)
Refilled lipitor

## 2011-07-18 ENCOUNTER — Other Ambulatory Visit: Payer: Self-pay | Admitting: Pulmonary Disease

## 2011-07-23 DIAGNOSIS — N289 Disorder of kidney and ureter, unspecified: Secondary | ICD-10-CM | POA: Diagnosis not present

## 2011-07-23 DIAGNOSIS — N281 Cyst of kidney, acquired: Secondary | ICD-10-CM | POA: Diagnosis not present

## 2011-07-23 DIAGNOSIS — N39498 Other specified urinary incontinence: Secondary | ICD-10-CM | POA: Diagnosis not present

## 2011-08-03 ENCOUNTER — Ambulatory Visit (INDEPENDENT_AMBULATORY_CARE_PROVIDER_SITE_OTHER): Payer: Medicare Other | Admitting: Pulmonary Disease

## 2011-08-03 ENCOUNTER — Other Ambulatory Visit (INDEPENDENT_AMBULATORY_CARE_PROVIDER_SITE_OTHER): Payer: Medicare Other

## 2011-08-03 ENCOUNTER — Encounter: Payer: Self-pay | Admitting: Pulmonary Disease

## 2011-08-03 ENCOUNTER — Ambulatory Visit (INDEPENDENT_AMBULATORY_CARE_PROVIDER_SITE_OTHER)
Admission: RE | Admit: 2011-08-03 | Discharge: 2011-08-03 | Disposition: A | Discharge status: Home / Self Care | Payer: Medicare Other | Source: Ambulatory Visit | Attending: Pulmonary Disease | Admitting: Pulmonary Disease

## 2011-08-03 VITALS — BP 126/74 | HR 74 | Temp 97.2°F | Ht 62.0 in | Wt 217.2 lb

## 2011-08-03 DIAGNOSIS — R0989 Other specified symptoms and signs involving the circulatory and respiratory systems: Secondary | ICD-10-CM | POA: Diagnosis not present

## 2011-08-03 DIAGNOSIS — J984 Other disorders of lung: Secondary | ICD-10-CM | POA: Diagnosis not present

## 2011-08-03 DIAGNOSIS — M1990 Unspecified osteoarthritis, unspecified site: Secondary | ICD-10-CM

## 2011-08-03 DIAGNOSIS — K219 Gastro-esophageal reflux disease without esophagitis: Secondary | ICD-10-CM

## 2011-08-03 DIAGNOSIS — E7800 Pure hypercholesterolemia, unspecified: Secondary | ICD-10-CM | POA: Diagnosis not present

## 2011-08-03 DIAGNOSIS — R0609 Other forms of dyspnea: Secondary | ICD-10-CM

## 2011-08-03 DIAGNOSIS — I428 Other cardiomyopathies: Secondary | ICD-10-CM

## 2011-08-03 DIAGNOSIS — E119 Type 2 diabetes mellitus without complications: Secondary | ICD-10-CM

## 2011-08-03 DIAGNOSIS — K5730 Diverticulosis of large intestine without perforation or abscess without bleeding: Secondary | ICD-10-CM

## 2011-08-03 DIAGNOSIS — J45909 Unspecified asthma, uncomplicated: Secondary | ICD-10-CM | POA: Diagnosis not present

## 2011-08-03 DIAGNOSIS — R911 Solitary pulmonary nodule: Secondary | ICD-10-CM | POA: Diagnosis not present

## 2011-08-03 DIAGNOSIS — I2510 Atherosclerotic heart disease of native coronary artery without angina pectoris: Secondary | ICD-10-CM

## 2011-08-03 DIAGNOSIS — J479 Bronchiectasis, uncomplicated: Secondary | ICD-10-CM

## 2011-08-03 DIAGNOSIS — I10 Essential (primary) hypertension: Secondary | ICD-10-CM

## 2011-08-03 DIAGNOSIS — R0602 Shortness of breath: Secondary | ICD-10-CM | POA: Diagnosis not present

## 2011-08-03 DIAGNOSIS — F411 Generalized anxiety disorder: Secondary | ICD-10-CM

## 2011-08-03 DIAGNOSIS — I517 Cardiomegaly: Secondary | ICD-10-CM | POA: Diagnosis not present

## 2011-08-03 DIAGNOSIS — IMO0001 Reserved for inherently not codable concepts without codable children: Secondary | ICD-10-CM

## 2011-08-03 DIAGNOSIS — E669 Obesity, unspecified: Secondary | ICD-10-CM

## 2011-08-03 DIAGNOSIS — D649 Anemia, unspecified: Secondary | ICD-10-CM

## 2011-08-03 DIAGNOSIS — R0600 Dyspnea, unspecified: Secondary | ICD-10-CM

## 2011-08-03 LAB — CBC WITH DIFFERENTIAL/PLATELET
Basophils Absolute: 0 10*3/uL (ref 0.0–0.1)
Basophils Relative: 0.2 % (ref 0.0–3.0)
HCT: 38.6 % (ref 36.0–46.0)
Hemoglobin: 12.7 g/dL (ref 12.0–15.0)
Lymphs Abs: 1.7 10*3/uL (ref 0.7–4.0)
MCHC: 32.9 g/dL (ref 30.0–36.0)
Monocytes Relative: 8.4 % (ref 3.0–12.0)
Neutro Abs: 9.7 10*3/uL — ABNORMAL HIGH (ref 1.4–7.7)
RBC: 4.14 Mil/uL (ref 3.87–5.11)
RDW: 14.2 % (ref 11.5–14.6)

## 2011-08-03 LAB — BASIC METABOLIC PANEL
BUN: 10 mg/dL (ref 6–23)
Chloride: 100 mEq/L (ref 96–112)
Glucose, Bld: 151 mg/dL — ABNORMAL HIGH (ref 70–99)
Potassium: 3.6 mEq/L (ref 3.5–5.1)

## 2011-08-03 LAB — HEPATIC FUNCTION PANEL
Albumin: 4.4 g/dL (ref 3.5–5.2)
Alkaline Phosphatase: 61 U/L (ref 39–117)
Bilirubin, Direct: 0 mg/dL (ref 0.0–0.3)

## 2011-08-03 LAB — LIPID PANEL: Cholesterol: 131 mg/dL (ref 0–200)

## 2011-08-03 MED ORDER — ALPRAZOLAM 0.5 MG PO TABS: 0.5000 mg | ORAL_TABLET | Freq: Three times a day (TID) | ORAL | Status: DC | PRN

## 2011-08-03 MED ORDER — HYDROCODONE-ACETAMINOPHEN 5-500 MG PO CAPS: 1.0000 | ORAL_CAPSULE | Freq: Two times a day (BID) | ORAL | Status: DC | PRN

## 2011-08-03 MED ORDER — AMOXICILLIN-POT CLAVULANATE 875-125 MG PO TABS: 1.0000 | ORAL_TABLET | Freq: Two times a day (BID) | ORAL | Status: DC

## 2011-08-03 NOTE — Patient Instructions (Addendum)
Today we updated your med list in our EPIC system...    Continue your current medications the same...    We refilled the Augmentin to take for your sinus infection...  Today we did your follow up CXR & FASTING blood work...    We will call you w/ the report when available...  Let's get on track w/ our diet & exercise program...    The goal is to lose 15-20 lbs!!!  Call for any questions...  Let's continue our 6 month follow up visits.Marland KitchenMarland Kitchen

## 2011-08-06 ENCOUNTER — Other Ambulatory Visit: Payer: Self-pay | Admitting: *Deleted

## 2011-08-06 ENCOUNTER — Encounter: Payer: Self-pay | Admitting: Pulmonary Disease

## 2011-08-06 MED ORDER — GLIMEPIRIDE 1 MG PO TABS: 1.0000 mg | ORAL_TABLET | Freq: Every day | ORAL | Status: DC

## 2011-08-06 MED ORDER — ALBUTEROL SULFATE (2.5 MG/3ML) 0.083% IN NEBU: 2.5000 mg | INHALATION_SOLUTION | Freq: Three times a day (TID) | RESPIRATORY_TRACT | Status: DC | PRN

## 2011-08-06 NOTE — Progress Notes (Addendum)
Subjective:    Patient ID: [name], female    DOB: [birth year], 72 y.o.   MRN: [id number]  HPI 71 y/o BF here for a follow up visit... she has multiple medical problems as noted below...    ~  May 22, 2010:  she was seen 05/18/10 for a 63moROV & complained of left CWP that was evaluated in the ER & [place] [facility] "my ribs are out of whack due to the coughing" >> see prev EMR note for details... she was asked to watch for rash on her side as poss evolution of shingles diagnosis & she called today w/ c/o rash on her side> asked to come for recheck so we could document the distrib of the rash & start therapy...  However on inspection there is no rash present & when asked she said "Oh, isn't that a rash" pointing to her normal skin in her left side... no redness, no hyperesthesia, etc... she did note some itching & as time went on the itching seemed to be more or a problem...     We discussed Shingles & what it looks like> offered Atarax to take for her itching... continue other Rx.  ~  June 30, 2010:  she notes her wt is up 3# to 211# stating that she can't walk much due to her arthritis ("Xrays showed arthritis all over my body & I have to use a cane")> c/o pain but she says her left chest wall pain is improved [facility] Nabumetone & Parafon...    She sees DrStark for GI> on Omep, Asacol, Canasa, Analpram, Promethazine...    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    We follow for Resp problems> on Flonase, NEBS vs Ventolin, Singulair, Mucinex, Tussionex... and [facility] issues on Lipitor, Metformin, and Alprazolam for nerves...  Needs fasting labs today, on Lip10 now per her insurance...  ~  January 02, 2011:  644moOV & she persists w/ mult somatic complaints> requests refills of several meds today...    Asthma, Bronchiectasis, & LLL nodule on CT scan> she only  takes her breathing meds as needed including Albuterol for NEB vs Proventil MDI, & she stopped Advair on her own; prob granuloma LLL (see below)...    HBP & CAD> she saw DrKatz for Cards f/u 4/12 on Aten, Amlodip, Clonidine, Lasix, KCl, Imdur, Plavix; she had some CWP but no angina> no changes made; BP today= 130/78 and she notes some atypCP, rare palpit, occas dizzy w/o syncope, +SOB/ DOE, & intermittent edema; we decided to stop the Clonidine & substitute Losartan 5062m...    Chol> FLP looks good on Lipitor 76m53m& she is encouraged to get on a good low chol, low fat diet & get the weight down...    DM & Obesity> DM contol is adeq on Metform monotherapy but she desperately needs to lose the wt & has been totally ineffective on her prev diets...    GI> GERD, Divertics, Hx UC, Polyps> she saw DrStark 7/12 & she was stable on her Omep40mg7mor reflux, Asacol 4.8g daily & Canasa suppos prn for her UC, & f/u colon due 11/12...    GU> followed by DrWrenn as noted below w/ stress incont, hx UTIs, hx hematuria, & prob cyst left kidney which he continues to follow...    DJD, FM> managed by DrVoytek on Relafen, Lorcet, and  various other pain meds over the year...     ~  August 03, 2011:  32moROV & JLorenzais c/o sinusitis "blowing pieces of meat w/ blood in it", swollen glands, drainage, pain into ears, etc> we discussed Rx w/ Augmentin,Mucinex, MMW, Flonase, Saline, etc... Breathing is stable other than sinus symptoms & denies much cough, phlegm, ch in dyspnea;  Cardiac followed by DBenay Spice& Imdur incr to 652md & f/u Myoview was felt to be a neg study;  Chol stable on Lip10 but DM control worse on Metform alone & we are going to add Glimep1m30m;  She's had mod problem w/ LBP & saw Ortho w/ epidural steroid injections that have helped her pain... CXR 4/13 showed cardiomeg, calcif Ao, incr markings, no nodule seen, NAD... LABS 4/13:  FLP-at goals on Lip10;  Chems- ok x BS=151 A1c=8.2;  CBC-ok;  TSH=1.30;  BNP=42;              Problem List:     PROBLEM LIST UPDATED 08/03/11 >> she did not bring med bottles to review.          ALLERGY (ICD-995.3) - on Saline, FLONASE 2spBid, etc...  ASTHMA (ICD-493.90) - prev on Advair but she stopped on her own> she takes MUCLynd needed, & NEBULIZER w/ Albut as needed & PROAIR Prn when out & about... she is an ex-smoker having quit in 1975, no recent exacerbations.  BRONCHIECTASIS (ICD-494.0) - CT Chest 3/10 showed bilat LL bronchiectasis L>R; advised to take meds regularly & avoid infections> NEBS, MUCINEX, Fluids, etc...  PULMONARY NODULE (ICD-518.89) - 1.4cm nodule in LLL showed up on Urology CTAbd 07/12/08>  CT Chest 07/22/08 confirmed 1.4cm peripheral LLL nodule that wasn't present on CT in 2005 & no other lesions, +bronchiectasis in both LLs L>R, mod cardiomegaly...  PET Scan 4/10 showed no abn hypermetabolic activity- therefore likely scar or granuloma... ~  CXR 2/11 showed cardiomeg, some COPD, NAD- LLL nodule seen on CT 3/10 is not vis on CXR. ~  CXR 1/12 in ER showed cardiomeg, vasc congestion, no overt edema, no nodule seen. ~  CXR 4/13 showed cardiomeg, calcif Ao, incr markings, no nodule seen, NAD...  HYPERTENSION (ICD-401.9) - controlled on ATENOLOL 40m72m NORVASC 10mg1mLOSARTAN 100mg/76mASIX 40mg- 42mbs/d (2AM, 1PM), KCL 20/d...  ~  9/12:  BP=138/80 today, not checking BP at home... denies HAs, visual changes, palipit, dizziness, syncope, dyspnea, edema, etc... ~  4/13:  BP= 126/74 & she is relatively asymptomatic but has mult somatic complaints...  CORONARY ARTERY DISEASE (ICD-414.00) - on PLAVIX 75mg/d 23mOL/ allergic to ASA) & IMDUR 60mg dai27m. ~  she is s/p PTCA & stent to the LAD on 10/02... ~  last cath 2/07 w/ patent LAD stent, non-obstructive dis otherwise, EF=65%... ~  last NuclearStressTest 5/08 showed no ischemia, EF=64% (similiar to 2005)... ~  hosp in Jun09 w/ CP- cath showing non-obstructive CAD w/ 25-40% lesions in all 3 vessels, prev  stent in LAD patent, norm LVF.. ~  Baseline EKG shows NSR, minor NSSTTWA, NAD... ~  Last Cards f/u 12/12> Cad, stent in LAD, c/o freq atypCPs, IMDUR increased to 60mg/d- e71mraged to incr exercise. ~  f/u MYOVIEW 12/12 showed prob normal stress nuclear study w/ mild reversible defect in anterior wall likely due to breast attenuation- cannot completely exclude ischemia.  CHEST PAIN, ATYPICAL (ICD-786.59) - she has chronic CWP, prob related to her fibromyalgia... ~  1/12:  she went to ER, then [facility] w/ left CWP- cardiac evals neg,  treated w/ rest, heat, Vicodin, Tussionex, etc... subseq got shots from [facility]...  HYPERCHOLESTEROLEMIA (ICD-272.0) - on LIPITOR 35m/d, + diet Rx.. ~  [place] showed TChol 132, TG 58, HDL 60, LDL 60 ~  FLP 7/09 showed TChol 116, TG 49, HDL 49, LDL 57 ~  FLP in hosp 3/10 showed TChol 130, TG 55, HDL 53, LDL 66 ~  FLP 2/11 on Cres5 showed TChol 104, TG 67, HDL 50, LDL 40 ~  Crestor 566mchanged to Lipitor 109mer insurance co. ~  FLP 3/12 on Lip10 showed TChol 139, TG 64, HDL 57, LDL 69 ~  FLP 9/12 on Lip10 showed TChol 126, TG 79, HDL 54, LDL 57 ~  FLP 4/13 on Lip10 showed TChol 131, TG 95, HDL 52, LDL 60  DIABETES MELLITUS (ICD-250.00) - on METFORMIN 500m52m, + low carb diet... ~  last labs 11/08 showed BS=120, HgA1c=6.2 ~  labs 6/09 in hosp showed BS=110-120, HgA1c= 6.6... sMarland KitchenMarland Kitchene meds, needs better diet! ~  labs 3/10 in hosp showed BS= 113-155, HgA1c= 6.7 ~  labs 2/11 showed BS= 108, A1c= 7.1 ~  labs 1/12 in ER showed BS= 167... ~  labs 3/12 showed BS= 121, A1c= 7.2... cMarland KitchenMarland Kitchentinue same meds, better diet! ~  Labs 9/12 showed BS= 131, A1c= 6.9... CMarland KitchenMarland Kitchentinue Metform500Bid ~  Labs 4/13 showed BS= 151, A1c= 8.2... NMarland KitchenMarland Kitchends more meds> add GLIMEPIRIDE 1mgQ2m OBESITY (ICD-278.00)   ~  weight 11/09 = 204# ~  weight 3/10 = 208# ~  weight 2/11 = 205#... we reviewed diet + exercise program. ~  weight 1/12 = 208# ~  Weight 3/12 = 211#... We reviewed diet & exercise yet  again. ~  Weight 9/12 = 216# ~  Weight 4/13 = 217#... We reviewed diet, exercise, wt reduction program.  GERD (ICD-530.81) - on PRILOSEC 20mg-83ming 1-2 daily but insurance won't pay- wants OMEPRAZOLE 40mg/d67m.  DIVERTICULOSIS OF COLON (ICD-562.10) & COLONIC POLYPS (ICD-211.3) - last colonoscopy was 7/08 w/ divertics & colitis seen, + hyperplastic polyp removed...  Hx of ULCERATIVE COLITIS (ICD-556.9) - followed by DrStark for GI- she has been on on Lialda, Rowasa, & Hydrocort enemas in the past... ~  seen 10/11 by DrStark w/ freq loose stools- Asacol incr to 4tabs Tid & CANASA suppos for 56mo. ~ 5mo maintenance regimen is ASACOL 400mg- 2t65mTid and CANASA suppos as needed...  MICROSCOPIC HEMATURIA (ICD-599.72),  NEOPLASM, KIDNEY (ICD-239.5),  STRESS INCONTINENCE (ICD-788.39) - eval and Rx by DrWrenn 9/09 w/ stress incontinence, dysuria w/ microscopic hematuria-  Cysto w/ bladder bx was neg; and CT + Sonar w/ 1.8cm left upper pole renal lesion of ?etiology- poss tumor vs cyst (min enhancement, solid on sonar w/ min blood flow + 2 sm cysts also seen)...  they are following a watchful waiting protocol & repeat CT 3/10 showed stable 1.7cm category II cyst upper pole of left kidney without change... f/u planned 68yr.  DEG5yrATIVE JOINT DISEASE (ICD-715.90) - she is followed by DrVoytek et al on CELEBREX vs Relafen, PARAFON [employer]. FIBROMYALGIA (ICD-729.1) LOW BACK PAIN ~  she was seen 12/11 w/ bilat foot pain & XRay showed arthritic changes- given DepoMedrol+Toradol shot IM. ~  11/12: seen by Ortho for back & leg pain> MRI showed lumbar spondylosis & mod central canal stenosis; given ESI 11/12 w/ 75% improvement in pain. ~  She reports that ESI was repeated 3/13 & further improved by her hx...  VITAMIN D DEFICIENCY (ICD-268.9) - Vit D level 2/11= 11 and pt  rec to take Vit D 50000 u weekly but ?never filled this Rx... therefore rec 8/11 to switch to OTC Vit D 5000 u daily from the health  food store...  ~  Labs 3/12 showed Vit D level = 46  ANXIETY (ICD-300.00) - she has lots of stress caring for Momma... on ALPRAZOLAM 0.12m Tid Prn...  Hx of ANEMIA (ICD-285.9) - on NUIRON 1554md...  ~  labs 11/08 showed Hg=12.2, Fe=38/ TIBC=287/ sat=9%... ~  labs 3/10 in hosp showed Hg= 13.0, MCV= 92... ~  labs 2/11 showed Hg= 12.7, Fe= 67... ~  labs 1/12 in ER showed Hg= 11.7 ~  Labs 3/12 showed Hg= 12.3, MCV= 93 ~  Labs 9/12 showed Hg= 12.5, MCV= 94 ~  Labs 4/13 showed Hg= 12.7   Past Surgical History  Procedure Date  . Cholecystectomy   . Hand surgery 01/2006    Right Hand Surgery by Dr. GrAmedeo Plenty. Coronary stent placement 2002    BMS to the LAD  . Abdominal hysterectomy     Outpatient Encounter Prescriptions as of 08/03/2011  Medication Sig Dispense Refill  . albuterol (PROVENTIL) (2.5 MG/3ML) 0.083% nebulizer solution USE IN NEBULIZER UP TO THREE TIMES PER DAY AS NEEDED FOR WHEEZING...  300 mL  1  . ALPRAZolam (XANAX) 0.5 MG tablet Take 1 tablet (0.5 mg total) by mouth 3 (three) times daily as needed for anxiety.  90 tablet  5  . amLODipine (NORVASC) 10 MG tablet TAKE 1 TABLET EVERY DAY  34 tablet  5  . atenolol (TENORMIN) 50 MG tablet TAKE 1 TABLET EVERY DAY  30 tablet  5  . atorvastatin (LIPITOR) 10 MG tablet TAKE 1 TABLET EVERY DAY  30 tablet  11  . celecoxib (CELEBREX) 200 MG capsule Take 200 mg by mouth daily.        . Cholecalciferol (VITAMIN D-3) 5000 UNITS TABS Take 5,000 Units by mouth daily.        . clopidogrel (PLAVIX) 75 MG tablet Take 1 tablet (75 mg total) by mouth daily.  30 tablet  9  . fluticasone (FLONASE) 50 MCG/ACT nasal spray USE 2 SPRAYS DAILY AS DIRECTED  16 g  6  . furosemide (LASIX) 40 MG tablet Take 1 tablet (40 mg total) by mouth 3 (three) times daily.  90 tablet  4  . guaiFENesin (MUCINEX) 600 MG 12 hr tablet 600 mg. AS NEEDED      . hydrocodone-acetaminophen (LORCET-HD) 5-500 MG per capsule Take 1 capsule by mouth 2 (two) times daily as needed  for pain.  60 capsule  5  . hydrocortisone-pramoxine (ANALPRAM-HC) 2.5-1 % rectal cream Place rectally as needed. APPLY A SMALL AMOUNT TO AFFECTED AREA.      . hydrOXYzine (ATARAX/VISTARIL) 25 MG tablet TAKE 1 TABLET BY MOUTH EVERY 4 HOURS AS NEEDED FOR ITCHING  50 tablet  2  . isosorbide mononitrate (IMDUR) 60 MG 24 hr tablet Take 1 tablet (60 mg total) by mouth daily.  30 tablet  6  . losartan (COZAAR) 50 MG tablet Take 1 tablet (50 mg total) by mouth daily. For blood pressure  30 tablet  11  . mesalamine (ASACOL) 400 MG EC tablet Take 4 tablets (1,600 mg total) by mouth 3 (three) times daily.  360 tablet  11  . mesalamine (CANASA) 1000 MG suppository Place 1,000 mg rectally as needed.        . metFORMIN (GLUCOPHAGE) 500 MG tablet TAKE 1 TABLET BY MOUTH TWICE A DAY  60 tablet  5  . nabumetone (RELAFEN) 750 MG tablet TAKE 2 TABLETS BY MOUTH WITH FOOD DAILY AS NEEDED  60 tablet  5  . NITROSTAT 0.4 MG SL tablet 1 TABLET UNDER TONGUE AT ONSET OF CHEST PAIN YOU MAY REPEAT EVERY 5 MINUTES FOR UP TO 3 DOSES.  25 tablet  6  . nystatin (MYCOSTATIN) 100000 UNIT/ML suspension GARGLE & SWALLOW 1 TEASPOONFUL 4 TIMES A DAY  120 mL  5  . omeprazole (PRILOSEC) 40 MG capsule Take 40 mg by mouth daily.        . potassium chloride SA (K-DUR,KLOR-CON) 20 MEQ tablet Take 20 mEq by mouth daily.        . promethazine (PHENERGAN) 25 MG tablet Take 1 tablet (25 mg total) by mouth 2 (two) times daily as needed. For nausea  60 tablet  5  . VENTOLIN HFA 108 (90 BASE) MCG/ACT inhaler USE 1- 2 SPRAYS EVERY 6 HOURS AS NEEDED FOR WHEEZING  1 Inhaler  6  . DISCONTD: ALPRAZolam (XANAX) 0.5 MG tablet Take 0.5 mg by mouth as needed.        Marland Kitchen DISCONTD: hydrocodone-acetaminophen (LORCET-HD) 5-500 MG per capsule Take 1 capsule by mouth 2 (two) times daily as needed for pain. As needed for pain  60 capsule  5  . amoxicillin-clavulanate (AUGMENTIN) 875-125 MG per tablet Take 1 tablet by mouth as needed.        Marland Kitchen amoxicillin-clavulanate  (AUGMENTIN) 875-125 MG per tablet Take 1 tablet by mouth 2 (two) times daily.  14 tablet  0  . amoxicillin-clavulanate (AUGMENTIN) 875-125 MG per tablet Take 1 tablet by mouth 2 (two) times daily.  14 tablet  0  . PROCTOSOL HC 2.5 % rectal cream APPLY EVERDAY AS DIRECTED  28.35 g  6  . DISCONTD: predniSONE (STERAPRED UNI-PAK) 5 MG TABS 6 day pack take as directed  21 tablet  0    Allergies  Allergen Reactions  . Aspirin   . Azithromycin     REACTION: pt states "ZPak doesn't work"  . Ciprofloxacin   . Codeine   . Levofloxacin     REACTION: chest pain  . Oxycodone-Acetaminophen     REACTION: vomiting  . Tramadol Hcl     REACTION: pt states "spaced-out"    Current Medications, Allergies, Past Medical History, Past Surgical History, Family History, and Social History were reviewed in [employer] record.    Review of Systems         See HPI - all other systems neg except as noted...  The patient complains of weight gain, decreased hearing, chest pain, dyspnea on exertion, muscle weakness, and difficulty walking.  The patient denies anorexia, fever, weight loss, vision loss, hoarseness, syncope, peripheral edema, prolonged cough, headaches, hemoptysis, abdominal pain, melena, hematochezia, severe indigestion/heartburn, hematuria, incontinence, suspicious skin lesions, transient blindness, depression, unusual weight change, abnormal bleeding, enlarged lymph nodes, and angioedema.     Objective:   Physical Exam      WD, Obese, 71 y/o BF in NAD... c/o pain in her left lat chest wall> no rash present. GENERAL:  Alert & oriented; pleasant & cooperative... HEENT:  [place]/AT, EOM-wnl, PERRLA, EACs-bilat cerumen, TMs-wnl, NOSE-clear, THROAT-clear & wnl. NECK:  Supple w/ fairROM; no JVD; normal carotid impulses w/o bruits; no thyromegaly or nodules palpated; no lymphadenopathy. CHEST:  Clear to P & A; without wheezes/ rales/ or rhonchi... +trigger points along trapezius area  and chest wall. HEART:  Regular Rhythm; without murmurs/ rubs/ or gallops heard... +tender  to palp. ABDOMEN:  Obese, soft & nontender; normal bowel sounds; no organomegaly or masses detected. EXT: without deformities, mild arthritic changes; no varicose veins/ +venous insuffic/ tr edema. NEURO:  CN's intact;  no focal neuro deficits... DERM:  no shingles rash found...  RADIOLOGY DATA:  Reviewed in the EPIC EMR & discussed w/ the patient... CXR 4/13 showed cardiomeg, calcif Ao, incr markings, no nodule seen, NAD...  LABORATORY DATA:  Reviewed in the EPIC EMR & discussed w/ the patient... LABS 4/13:  FLP-at goals on Lip10;  Chems- ok x BS=151 A1c=8.2;  CBC-ok;  TSH=1.30;  BNP=42;     Assessment & Plan:   ASTHMA, Bronchiectasis, LLL nodule>  She refuses to take meds regularly & is encouraged to use the Nebulizer Bid, Mucinex Bid, drink plenty of fluids, etc... She will call for any breathing problems and will require OV for assessment...  HBP>  Control is adeq but her regimen complex & I bet compliance is poor; we discussed the need for an ACE or ARB w/ her DM & we changed her Clonidine to Losartan- tol well.  CAD>  On Imdur & Plavix in addition to above BP meds; followed by [facility] & felt to have mostly CWP from her FM & no signif angina...  CHOL>  Stable on Lipitor 47m/d, but it would be great to lose some weight...  DM>  DM control is worse on her Metform monotherapy, she hasn't lost weight, decided to add GLIMEP 139mQam...  OBESITY>  Wt up to 217# and we discussed wt reduction strategies...  GI> GERD on OMEP4090m;  Ulc colitis on Asacol & Canasa followed by DrStark...  GU followed by DrWrenn and stable as well...  DJD/ FM/ LBP>  Followed by DrVoytek on Celebrex, Parafon, and Lorcet; she is s/p 2 ESI's w/ improvement.  Vit D Defic>  She switched to OTC vit d supplement taking 5000 u daily...  Anxiety> on alprazolam Tid prn...  Anemia> stable off her Fe supplement at  present...Marland KitchenMarland Kitchen

## 2011-09-03 ENCOUNTER — Telehealth: Payer: Self-pay | Admitting: Pulmonary Disease

## 2011-09-03 MED ORDER — PREDNISONE (PAK) 5 MG PO TABS: ORAL_TABLET | ORAL | Status: DC

## 2011-09-03 MED ORDER — SULFAMETHOXAZOLE-TRIMETHOPRIM 800-160 MG PO TABS: 1.0000 | ORAL_TABLET | Freq: Two times a day (BID) | ORAL | Status: AC

## 2011-09-03 NOTE — Telephone Encounter (Signed)
rx's have been sent to the pharmacy and she voiced her understanding. Nothing further was needed

## 2011-09-03 NOTE — Telephone Encounter (Signed)
Per SN--ok for pt to have septra ds  #20  1 po bid until gone and mucinex 2 po bid with plenty of fluids.  thanks

## 2011-09-03 NOTE — Telephone Encounter (Signed)
I spoke with pt and advised her of sn recs. She is requesting an rx for prednisone as well. Please advise SN thanks

## 2011-09-03 NOTE — Telephone Encounter (Signed)
Per SN---ok to give pt sterapred dosepak  5 mg   6 day pack.

## 2011-09-03 NOTE — Telephone Encounter (Signed)
I spoke with pt and she c/o increase SOB, wheezing, nasal congestion, PND, blowing out green phlem w/ streaks of blood in it x Friday. Denies any f/c/s/n/v. Pt states she has been doing her breathing tx's and using her inhalers TID w/o relief. Pt also using her nasal spray as well. No available openings. Pt is requesting further recs. Please advise SN, thanks  Allergies  Allergen Reactions  . Aspirin   . Azithromycin     REACTION: pt states "ZPak doesn't work"  . Ciprofloxacin   . Codeine   . Levofloxacin     REACTION: chest pain  . Oxycodone-Acetaminophen     REACTION: vomiting  . Tramadol Hcl     REACTION: pt states "spaced-out"     [facility]

## 2011-09-12 ENCOUNTER — Other Ambulatory Visit: Payer: Self-pay | Admitting: Pulmonary Disease

## 2011-09-20 ENCOUNTER — Telehealth: Payer: Self-pay | Admitting: Pulmonary Disease

## 2011-09-20 DIAGNOSIS — R21 Rash and other nonspecific skin eruption: Secondary | ICD-10-CM

## 2011-09-20 NOTE — Telephone Encounter (Signed)
Pt returned call. Says that her L foot has "red spots on the side again". Unclear who she wanted to see (I asked her twice but couldn't understand what she was saying). Mariann Laster

## 2011-09-20 NOTE — Telephone Encounter (Signed)
I spoke with pt and she is requesting a referral to Dr. Blenda Mounts (podiatrists) for red spots on her left foot--it's painful and very itchy x 6 months now. Pt use to see Dr. Blenda Mounts many years ago but was advised she needs a new referral. Please advise SN thanks

## 2011-09-20 NOTE — Telephone Encounter (Signed)
Order has been placed and we will call pt once the appt has been scheduled for her.  Pt is aware.

## 2011-09-20 NOTE — Telephone Encounter (Signed)
lmomtcb for the pt ---need to see what problems she is having

## 2011-09-24 ENCOUNTER — Other Ambulatory Visit: Payer: Self-pay | Admitting: Cardiology

## 2011-09-25 DIAGNOSIS — B353 Tinea pedis: Secondary | ICD-10-CM | POA: Diagnosis not present

## 2011-09-25 DIAGNOSIS — M79609 Pain in unspecified limb: Secondary | ICD-10-CM | POA: Diagnosis not present

## 2011-09-25 DIAGNOSIS — B351 Tinea unguium: Secondary | ICD-10-CM | POA: Diagnosis not present

## 2011-10-05 ENCOUNTER — Ambulatory Visit: Payer: Medicare Other | Admitting: Cardiology

## 2011-10-07 ENCOUNTER — Encounter: Payer: Self-pay | Admitting: Cardiology

## 2011-10-09 ENCOUNTER — Ambulatory Visit (INDEPENDENT_AMBULATORY_CARE_PROVIDER_SITE_OTHER): Payer: Medicare Other | Admitting: Cardiology

## 2011-10-09 ENCOUNTER — Encounter: Payer: Self-pay | Admitting: Cardiology

## 2011-10-09 VITALS — BP 124/78 | HR 72 | Ht 62.0 in | Wt 216.4 lb

## 2011-10-09 DIAGNOSIS — I509 Heart failure, unspecified: Secondary | ICD-10-CM

## 2011-10-09 DIAGNOSIS — IMO0001 Reserved for inherently not codable concepts without codable children: Secondary | ICD-10-CM | POA: Diagnosis not present

## 2011-10-09 DIAGNOSIS — I2510 Atherosclerotic heart disease of native coronary artery without angina pectoris: Secondary | ICD-10-CM | POA: Diagnosis not present

## 2011-10-09 NOTE — Assessment & Plan Note (Signed)
Coronary disease is stable. No change in therapy. See her back in 6 months.

## 2011-10-09 NOTE — Patient Instructions (Signed)
Your physician wants you to follow-up in: 6 months.   You will receive a reminder letter in the mail two months in advance. If you don't receive a letter, please call our office to schedule the follow-up appointment.

## 2011-10-09 NOTE — Progress Notes (Signed)
HPI  The patient is here doing well. She had been seen in the office in December, 2011. She was having some discomfort in the nuclear study was done. There is no significant ischemia. Ejection fraction was normal. She was reassured and has done well since.  Allergies  Allergen Reactions  . Aspirin   . Azithromycin     REACTION: pt states "ZPak doesn't work"  . Ciprofloxacin   . Codeine   . Levofloxacin     REACTION: chest pain  . Oxycodone-Acetaminophen     REACTION: vomiting  . Tramadol Hcl     REACTION: pt states "spaced-out"    Current Outpatient Prescriptions  Medication Sig Dispense Refill  . albuterol (PROVENTIL) (2.5 MG/3ML) 0.083% nebulizer solution Take 3 mLs (2.5 mg total) by nebulization 3 (three) times daily as needed for wheezing.  300 mL  6  . ALPRAZolam (XANAX) 0.5 MG tablet Take 1 tablet (0.5 mg total) by mouth 3 (three) times daily as needed for anxiety.  90 tablet  5  . amLODipine (NORVASC) 10 MG tablet TAKE 1 TABLET EVERY DAY  34 tablet  5  . atenolol (TENORMIN) 50 MG tablet TAKE 1 TABLET EVERY DAY  30 tablet  5  . atorvastatin (LIPITOR) 10 MG tablet TAKE 1 TABLET EVERY DAY  30 tablet  11  . celecoxib (CELEBREX) 200 MG capsule Take 200 mg by mouth daily.        . Cholecalciferol (VITAMIN D-3) 5000 UNITS TABS Take 5,000 Units by mouth daily.        . clopidogrel (PLAVIX) 75 MG tablet TAKE 1 TABLET BY MOUTH DAILY.  30 tablet  9  . fluticasone (FLONASE) 50 MCG/ACT nasal spray USE 2 SPRAYS DAILY AS DIRECTED  16 g  6  . furosemide (LASIX) 40 MG tablet Take 1 tablet (40 mg total) by mouth 3 (three) times daily.  90 tablet  4  . glimepiride (AMARYL) 1 MG tablet Take 1 tablet (1 mg total) by mouth daily before breakfast.  30 tablet  11  . guaiFENesin (MUCINEX) 600 MG 12 hr tablet 600 mg. AS NEEDED      . hydrocodone-acetaminophen (LORCET-HD) 5-500 MG per capsule Take 1 capsule by mouth 2 (two) times daily as needed for pain.  60 capsule  5  . hydrocortisone-pramoxine  (ANALPRAM-HC) 2.5-1 % rectal cream Place rectally as needed. APPLY A SMALL AMOUNT TO AFFECTED AREA.      . hydrOXYzine (ATARAX/VISTARIL) 25 MG tablet TAKE 1 TABLET BY MOUTH EVERY 4 HOURS AS NEEDED FOR ITCHING  50 tablet  2  . isosorbide mononitrate (IMDUR) 60 MG 24 hr tablet Take 1 tablet (60 mg total) by mouth daily.  30 tablet  6  . losartan (COZAAR) 50 MG tablet Take 1 tablet (50 mg total) by mouth daily. For blood pressure  30 tablet  11  . mesalamine (ASACOL) 400 MG EC tablet Take 4 tablets (1,600 mg total) by mouth 3 (three) times daily.  360 tablet  11  . mesalamine (CANASA) 1000 MG suppository Place 1,000 mg rectally as needed.        . metFORMIN (GLUCOPHAGE) 500 MG tablet TAKE 1 TABLET BY MOUTH TWICE A DAY  60 tablet  5  . montelukast (SINGULAIR) 10 MG tablet TAKE 1 TABLET AT BEDTIME  30 tablet  3  . Naftifine HCl (NAFTIN) 2 % CREA Apply topically 2 (two) times daily.      Marland Kitchen NITROSTAT 0.4 MG SL tablet 1 TABLET UNDER TONGUE  AT ONSET OF CHEST PAIN YOU MAY REPEAT EVERY 5 MINUTES FOR UP TO 3 DOSES.  25 tablet  6  . nystatin (MYCOSTATIN) 100000 UNIT/ML suspension GARGLE & SWALLOW 1 TEASPOONFUL 4 TIMES A DAY  120 mL  5  . omeprazole (PRILOSEC) 40 MG capsule Take 40 mg by mouth daily.        . potassium chloride SA (K-DUR,KLOR-CON) 20 MEQ tablet Take 20 mEq by mouth daily.        Marland Kitchen PROCTOSOL HC 2.5 % rectal cream APPLY EVERDAY AS DIRECTED  28.35 g  6  . promethazine (PHENERGAN) 25 MG tablet Take 1 tablet (25 mg total) by mouth 2 (two) times daily as needed. For nausea  60 tablet  5  . VENTOLIN HFA 108 (90 BASE) MCG/ACT inhaler USE 1- 2 SPRAYS EVERY 6 HOURS AS NEEDED FOR WHEEZING  1 Inhaler  6  . amoxicillin-clavulanate (AUGMENTIN) 875-125 MG per tablet Take 1 tablet by mouth as needed.        Marland Kitchen amoxicillin-clavulanate (AUGMENTIN) 875-125 MG per tablet Take 1 tablet by mouth 2 (two) times daily.  14 tablet  0  . amoxicillin-clavulanate (AUGMENTIN) 875-125 MG per tablet Take 1 tablet by mouth 2  (two) times daily.  14 tablet  0  . nabumetone (RELAFEN) 750 MG tablet TAKE 2 TABLETS BY MOUTH WITH FOOD DAILY AS NEEDED  60 tablet  5  . predniSONE (STERAPRED UNI-PAK) 5 MG TABS 6 day pack take as directed  21 tablet  0    History   Social History  . Marital Status: Divorced    Spouse Name: N/A    Number of Children: N/A  . Years of Education: N/A   Occupational History  . Retired    Social History Main Topics  . Smoking status: Former Smoker -- 2.0 packs/day for 15 years    Types: Cigarettes    Quit date: 04/30/1973  . Smokeless tobacco: Never Used   Comment: ex-smoker: smoked for 10-15 years up to 2ppd, quit 1975.  Marland Kitchen Alcohol Use: Yes     Social drinker  . Drug Use: No  . Sexually Active: No   Other Topics Concern  . Not on file   Social History Narrative  . No narrative on file    Family History  Problem Relation Age of Onset  . Pneumonia Father   . Heart attack Father   . Parkinsonism Mother   . Sarcoidosis Brother   . Hypertension Sister   . Diabetes Sister   . Colon cancer Neg Hx     Past Medical History  Diagnosis Date  . CHF (congestive heart failure)     Diastolic  . Volume overload   . Bronchiectasis   . Hypertension   . CAD (coronary artery disease)     BMS to the LAD, 2002 / catheterization 2009 LAD stents patent with otherwise mild disease; Managed medically.S/P myoview December 2012 felt to probably be normal.   . Diabetes mellitus   . Obesity   . GERD (gastroesophageal reflux disease)   . Fibromyalgia   . Anxiety   . Allergy, unspecified not elsewhere classified   . Pulmonary nodule     Negative PET in 2010  . Other diseases of lung, not elsewhere classified   . Hypercholesterolemia   . Diverticulosis of colon   . Ulcerative colitis, left sided 2008    Hx of  . Adenomatous colon polyp 02/2006  . Microscopic hematuria   . Neoplasm of kidney   .  Stress incontinence, female   . DJD (degenerative joint disease)   . Vitamin D  deficiency disease   . Anemia   . Asthma     Past Surgical History  Procedure Date  . Cholecystectomy   . Hand surgery 01/2006    Right Hand Surgery by Dr. Amedeo Plenty  . Coronary stent placement 2002    BMS to the LAD  . Abdominal hysterectomy     ROS  Patient denies fever, chills, headache, sweats, rash, change in vision, change in hearing, chest pain, cough, nausea vomiting, urinary symptoms. All other systems are reviewed and are negative.  PHYSICAL EXAM   Patient is overweight. She is stable. There is no jugulovenous distention. Lungs are clear. Respiratory effort is nonlabored. Cardiac exam reveals S1 and S2. There no fixed or significant murmurs. The abdomen is soft. There is no peripheral edema.  Filed Vitals:   10/09/11 0907  BP: 124/78  Pulse: 72  Height: _0  (1.575 m)  Weight: 216 lb 6.4 oz (98.158 kg)   EKG is done and reviewed by me. There is no significant change.  ASSESSMENT & PLAN

## 2011-10-09 NOTE — Assessment & Plan Note (Signed)
We'll was have to keep in mind the patient has some symptoms from this. She is stable.

## 2011-10-09 NOTE — Assessment & Plan Note (Signed)
Patient has a history of chronic diastolic heart failure. This is under excellent control. No change in therapy.

## 2011-10-17 ENCOUNTER — Telehealth: Payer: Self-pay | Admitting: Pulmonary Disease

## 2011-10-17 MED ORDER — NABUMETONE 750 MG PO TABS: 750.0000 mg | ORAL_TABLET | Freq: Two times a day (BID) | ORAL | Status: DC

## 2011-10-17 NOTE — Telephone Encounter (Signed)
Per SN---pred does not help with FM, call the pharmacy and cancel the celebrex and ok to call in relafen 732m  #60  1 po bid take with food.  She may need to call back to the rheumatology office and see what they recs as well.  thanks

## 2011-10-17 NOTE — Telephone Encounter (Signed)
Called, spoke with pt who states she is having pain and relates this to her fibromyalgia.  States "every part of my body" is hurting which started on Saturday and on Monday reports her "flesh was burning from the inflammation."  States the pain is sharp and achy and is 10/10.  Per pt, she has started taking 2 tabs of celebrex for 43m qd d/t this pain, has tried xanax and hydrocodone but not helping.  States the relafen 750 mg seemed to help better than the above medications in the past but she is currently out of this.  She would like refill on this, some prednisone, and any other recs from Dr. NLenna Gilford  Pls advise.  Thank you.  [facility]  Allergies verified: Allergies  Allergen Reactions  . Aspirin   . Azithromycin     REACTION: pt states "ZPak doesn't work"  . Ciprofloxacin   . Codeine   . Levofloxacin     REACTION: chest pain  . Oxycodone-Acetaminophen     REACTION: vomiting  . Tramadol Hcl     REACTION: pt states "spaced-out"

## 2011-10-17 NOTE — Telephone Encounter (Signed)
Pt is aware of Rx sent and D/C'd celebrex Rx.

## 2011-10-18 DIAGNOSIS — M47817 Spondylosis without myelopathy or radiculopathy, lumbosacral region: Secondary | ICD-10-CM | POA: Diagnosis not present

## 2011-10-18 DIAGNOSIS — M48061 Spinal stenosis, lumbar region without neurogenic claudication: Secondary | ICD-10-CM | POA: Diagnosis not present

## 2011-10-25 ENCOUNTER — Other Ambulatory Visit: Payer: Self-pay | Admitting: Cardiology

## 2011-11-25 ENCOUNTER — Encounter (HOSPITAL_COMMUNITY): Payer: Self-pay | Admitting: *Deleted

## 2011-11-25 ENCOUNTER — Emergency Department (HOSPITAL_COMMUNITY): Payer: Medicare Other

## 2011-11-25 ENCOUNTER — Other Ambulatory Visit: Payer: Self-pay

## 2011-11-25 ENCOUNTER — Observation Stay (HOSPITAL_COMMUNITY)
Admission: EM | Admit: 2011-11-25 | Discharge: 2011-11-28 | Disposition: A | Discharge status: Home / Self Care | Payer: Medicare Other | Attending: Internal Medicine | Admitting: Internal Medicine

## 2011-11-25 DIAGNOSIS — I5030 Unspecified diastolic (congestive) heart failure: Secondary | ICD-10-CM | POA: Diagnosis present

## 2011-11-25 DIAGNOSIS — R609 Edema, unspecified: Secondary | ICD-10-CM | POA: Diagnosis not present

## 2011-11-25 DIAGNOSIS — I5032 Chronic diastolic (congestive) heart failure: Secondary | ICD-10-CM

## 2011-11-25 DIAGNOSIS — F3289 Other specified depressive episodes: Secondary | ICD-10-CM | POA: Diagnosis not present

## 2011-11-25 DIAGNOSIS — E119 Type 2 diabetes mellitus without complications: Secondary | ICD-10-CM | POA: Diagnosis present

## 2011-11-25 DIAGNOSIS — T7840XA Allergy, unspecified, initial encounter: Secondary | ICD-10-CM

## 2011-11-25 DIAGNOSIS — I10 Essential (primary) hypertension: Secondary | ICD-10-CM | POA: Diagnosis present

## 2011-11-25 DIAGNOSIS — F411 Generalized anxiety disorder: Secondary | ICD-10-CM | POA: Diagnosis present

## 2011-11-25 DIAGNOSIS — Q619 Cystic kidney disease, unspecified: Secondary | ICD-10-CM | POA: Diagnosis not present

## 2011-11-25 DIAGNOSIS — R3129 Other microscopic hematuria: Secondary | ICD-10-CM

## 2011-11-25 DIAGNOSIS — R319 Hematuria, unspecified: Secondary | ICD-10-CM | POA: Diagnosis present

## 2011-11-25 DIAGNOSIS — R55 Syncope and collapse: Secondary | ICD-10-CM | POA: Diagnosis not present

## 2011-11-25 DIAGNOSIS — E559 Vitamin D deficiency, unspecified: Secondary | ICD-10-CM

## 2011-11-25 DIAGNOSIS — I2510 Atherosclerotic heart disease of native coronary artery without angina pectoris: Secondary | ICD-10-CM | POA: Diagnosis not present

## 2011-11-25 DIAGNOSIS — D4959 Neoplasm of unspecified behavior of other genitourinary organ: Secondary | ICD-10-CM

## 2011-11-25 DIAGNOSIS — F32A Depression, unspecified: Secondary | ICD-10-CM | POA: Diagnosis present

## 2011-11-25 DIAGNOSIS — F329 Major depressive disorder, single episode, unspecified: Secondary | ICD-10-CM | POA: Diagnosis not present

## 2011-11-25 DIAGNOSIS — I25119 Atherosclerotic heart disease of native coronary artery with unspecified angina pectoris: Secondary | ICD-10-CM | POA: Diagnosis present

## 2011-11-25 DIAGNOSIS — Z86010 Personal history of colonic polyps: Secondary | ICD-10-CM

## 2011-11-25 DIAGNOSIS — J209 Acute bronchitis, unspecified: Secondary | ICD-10-CM

## 2011-11-25 DIAGNOSIS — Z79899 Other long term (current) drug therapy: Secondary | ICD-10-CM | POA: Diagnosis not present

## 2011-11-25 DIAGNOSIS — K219 Gastro-esophageal reflux disease without esophagitis: Secondary | ICD-10-CM | POA: Diagnosis present

## 2011-11-25 DIAGNOSIS — E876 Hypokalemia: Secondary | ICD-10-CM | POA: Diagnosis not present

## 2011-11-25 DIAGNOSIS — D649 Anemia, unspecified: Secondary | ICD-10-CM

## 2011-11-25 DIAGNOSIS — L299 Pruritus, unspecified: Secondary | ICD-10-CM

## 2011-11-25 DIAGNOSIS — IMO0001 Reserved for inherently not codable concepts without codable children: Secondary | ICD-10-CM | POA: Diagnosis present

## 2011-11-25 DIAGNOSIS — I517 Cardiomegaly: Secondary | ICD-10-CM | POA: Diagnosis not present

## 2011-11-25 DIAGNOSIS — I428 Other cardiomyopathies: Secondary | ICD-10-CM

## 2011-11-25 DIAGNOSIS — M1990 Unspecified osteoarthritis, unspecified site: Secondary | ICD-10-CM

## 2011-11-25 DIAGNOSIS — K5190 Ulcerative colitis, unspecified, without complications: Secondary | ICD-10-CM | POA: Diagnosis present

## 2011-11-25 DIAGNOSIS — I509 Heart failure, unspecified: Secondary | ICD-10-CM | POA: Diagnosis present

## 2011-11-25 DIAGNOSIS — R0602 Shortness of breath: Secondary | ICD-10-CM | POA: Diagnosis not present

## 2011-11-25 DIAGNOSIS — D126 Benign neoplasm of colon, unspecified: Secondary | ICD-10-CM

## 2011-11-25 DIAGNOSIS — E8770 Fluid overload, unspecified: Secondary | ICD-10-CM

## 2011-11-25 DIAGNOSIS — K5730 Diverticulosis of large intestine without perforation or abscess without bleeding: Secondary | ICD-10-CM

## 2011-11-25 DIAGNOSIS — R600 Localized edema: Secondary | ICD-10-CM | POA: Diagnosis present

## 2011-11-25 DIAGNOSIS — J45909 Unspecified asthma, uncomplicated: Secondary | ICD-10-CM

## 2011-11-25 DIAGNOSIS — J984 Other disorders of lung: Secondary | ICD-10-CM

## 2011-11-25 DIAGNOSIS — E669 Obesity, unspecified: Secondary | ICD-10-CM

## 2011-11-25 DIAGNOSIS — R404 Transient alteration of awareness: Secondary | ICD-10-CM | POA: Diagnosis not present

## 2011-11-25 DIAGNOSIS — N39498 Other specified urinary incontinence: Secondary | ICD-10-CM

## 2011-11-25 DIAGNOSIS — R112 Nausea with vomiting, unspecified: Secondary | ICD-10-CM

## 2011-11-25 HISTORY — DX: Chronic diastolic (congestive) heart failure: I50.32

## 2011-11-25 LAB — CBC WITH DIFFERENTIAL/PLATELET
Basophils Absolute: 0 10*3/uL (ref 0.0–0.1)
Eosinophils Absolute: 0.3 10*3/uL (ref 0.0–0.7)
Eosinophils Relative: 3 % (ref 0–5)
Lymphocytes Relative: 20 % (ref 12–46)
MCV: 89.8 fL (ref 78.0–100.0)
Platelets: 405 10*3/uL — ABNORMAL HIGH (ref 150–400)
RDW: 14.1 % (ref 11.5–15.5)
WBC: 10.4 10*3/uL (ref 4.0–10.5)

## 2011-11-25 LAB — URINALYSIS, ROUTINE W REFLEX MICROSCOPIC
Bilirubin Urine: NEGATIVE
Ketones, ur: NEGATIVE mg/dL
Nitrite: NEGATIVE
Protein, ur: NEGATIVE mg/dL
Urobilinogen, UA: 0.2 mg/dL (ref 0.0–1.0)

## 2011-11-25 LAB — BASIC METABOLIC PANEL
Calcium: 9.6 mg/dL (ref 8.4–10.5)
GFR calc non Af Amer: >90 mL/min (ref >90)
Sodium: 137 mEq/L (ref 135–145)

## 2011-11-25 LAB — CARDIAC PANEL(CRET KIN+CKTOT+MB+TROPI)
CK, MB: 1.8 ng/mL (ref 0.3–4.0)
Relative Index: INVALID (ref 0.0–2.5)
Troponin I: <0.3 ng/mL (ref <0.30)

## 2011-11-25 LAB — PRO B NATRIURETIC PEPTIDE: Pro B Natriuretic peptide (BNP): 68.4 pg/mL (ref 0–125)

## 2011-11-25 LAB — TROPONIN I: Troponin I: <0.3 ng/mL (ref <0.30)

## 2011-11-25 MED ORDER — INSULIN ASPART 100 UNIT/ML ~~LOC~~ SOLN
3.0000 [IU] | Freq: Three times a day (TID) | SUBCUTANEOUS | Status: DC
  Administered 2011-11-26 – 2011-11-28 (×7): 3 [IU] via SUBCUTANEOUS

## 2011-11-25 MED ORDER — POTASSIUM CHLORIDE CRYS ER 20 MEQ PO TBCR
40.0000 meq | EXTENDED_RELEASE_TABLET | Freq: Every day | ORAL | Status: DC
  Administered 2011-11-26 – 2011-11-28 (×3): 40 meq via ORAL
  Filled 2011-11-25 (×3): qty 2

## 2011-11-25 MED ORDER — INSULIN ASPART 100 UNIT/ML ~~LOC~~ SOLN
0.0000 [IU] | Freq: Every day | SUBCUTANEOUS | Status: DC
  Administered 2011-11-28: 2 [IU] via SUBCUTANEOUS

## 2011-11-25 MED ORDER — ALUM & MAG HYDROXIDE-SIMETH 200-200-20 MG/5ML PO SUSP: 30.0000 mL | Freq: Four times a day (QID) | ORAL | Status: DC | PRN

## 2011-11-25 MED ORDER — GLIMEPIRIDE 1 MG PO TABS
1.0000 mg | ORAL_TABLET | Freq: Every day | ORAL | Status: DC
  Administered 2011-11-26 – 2011-11-28 (×3): 1 mg via ORAL
  Filled 2011-11-25 (×4): qty 1

## 2011-11-25 MED ORDER — CLOPIDOGREL BISULFATE 75 MG PO TABS
75.0000 mg | ORAL_TABLET | Freq: Every day | ORAL | Status: DC
  Administered 2011-11-26 – 2011-11-28 (×3): 75 mg via ORAL
  Filled 2011-11-25 (×4): qty 1

## 2011-11-25 MED ORDER — MAGNESIUM CHLORIDE 64 MG PO TBEC
1.0000 | DELAYED_RELEASE_TABLET | Freq: Two times a day (BID) | ORAL | Status: DC
  Administered 2011-11-25 – 2011-11-28 (×6): 64 mg via ORAL
  Filled 2011-11-25 (×7): qty 1

## 2011-11-25 MED ORDER — ALBUTEROL SULFATE (5 MG/ML) 0.5% IN NEBU: 2.5000 mg | INHALATION_SOLUTION | RESPIRATORY_TRACT | Status: DC | PRN

## 2011-11-25 MED ORDER — VITAMIN D 1000 UNITS PO TABS
5000.0000 [IU] | ORAL_TABLET | Freq: Every day | ORAL | Status: DC
  Administered 2011-11-25 – 2011-11-28 (×4): 5000 [IU] via ORAL
  Filled 2011-11-25 (×4): qty 5

## 2011-11-25 MED ORDER — SODIUM CHLORIDE 0.9 % IJ SOLN
3.0000 mL | Freq: Two times a day (BID) | INTRAMUSCULAR | Status: DC
Start: 2011-11-25 — End: 2011-11-28
  Administered 2011-11-26 – 2011-11-27 (×2): 3 mL via INTRAVENOUS

## 2011-11-25 MED ORDER — PANTOPRAZOLE SODIUM 40 MG PO TBEC
40.0000 mg | DELAYED_RELEASE_TABLET | Freq: Every day | ORAL | Status: DC
  Administered 2011-11-26 – 2011-11-28 (×3): 40 mg via ORAL
  Filled 2011-11-25 (×3): qty 1

## 2011-11-25 MED ORDER — SODIUM CHLORIDE 0.9 % IJ SOLN: 3.0000 mL | INTRAMUSCULAR | Status: DC | PRN

## 2011-11-25 MED ORDER — NABUMETONE 750 MG PO TABS
750.0000 mg | ORAL_TABLET | Freq: Two times a day (BID) | ORAL | Status: DC
  Administered 2011-11-26 – 2011-11-28 (×5): 750 mg via ORAL
  Filled 2011-11-25 (×7): qty 1

## 2011-11-25 MED ORDER — MESALAMINE 400 MG PO TBEC
1600.0000 mg | DELAYED_RELEASE_TABLET | Freq: Three times a day (TID) | ORAL | Status: DC
  Administered 2011-11-25 – 2011-11-26 (×3): 1600 mg via ORAL
  Filled 2011-11-25 (×5): qty 4

## 2011-11-25 MED ORDER — INSULIN ASPART 100 UNIT/ML ~~LOC~~ SOLN
0.0000 [IU] | Freq: Three times a day (TID) | SUBCUTANEOUS | Status: DC
  Administered 2011-11-26 – 2011-11-27 (×4): 1 [IU] via SUBCUTANEOUS
  Administered 2011-11-28: 2 [IU] via SUBCUTANEOUS

## 2011-11-25 MED ORDER — HYDROCODONE-ACETAMINOPHEN 10-325 MG PO TABS
1.0000 | ORAL_TABLET | ORAL | Status: DC | PRN
  Administered 2011-11-26 – 2011-11-27 (×3): 1 via ORAL
  Filled 2011-11-25 (×5): qty 1

## 2011-11-25 MED ORDER — LOSARTAN POTASSIUM 50 MG PO TABS
50.0000 mg | ORAL_TABLET | Freq: Every day | ORAL | Status: DC
  Administered 2011-11-26 – 2011-11-28 (×3): 50 mg via ORAL
  Filled 2011-11-25 (×3): qty 1

## 2011-11-25 MED ORDER — ISOSORBIDE MONONITRATE ER 60 MG PO TB24
60.0000 mg | ORAL_TABLET | Freq: Every day | ORAL | Status: DC
  Administered 2011-11-26 – 2011-11-28 (×3): 60 mg via ORAL
  Filled 2011-11-25 (×3): qty 1

## 2011-11-25 MED ORDER — ACETAMINOPHEN 325 MG PO TABS: 650.0000 mg | ORAL_TABLET | Freq: Four times a day (QID) | ORAL | Status: DC | PRN

## 2011-11-25 MED ORDER — SODIUM CHLORIDE 0.9 % IV SOLN
Freq: Once | INTRAVENOUS | Status: AC
  Administered 2011-11-25: 19:00:00 via INTRAVENOUS

## 2011-11-25 MED ORDER — MAGNESIUM SULFATE 50 % IJ SOLN: 1.0000 g | Freq: Once | INTRAMUSCULAR | Status: DC

## 2011-11-25 MED ORDER — SODIUM CHLORIDE 0.9 % IJ SOLN
3.0000 mL | Freq: Two times a day (BID) | INTRAMUSCULAR | Status: DC
  Administered 2011-11-26 – 2011-11-28 (×5): 3 mL via INTRAVENOUS

## 2011-11-25 MED ORDER — SODIUM CHLORIDE 0.9 % IV SOLN: 250.0000 mL | INTRAVENOUS | Status: DC | PRN

## 2011-11-25 MED ORDER — ATENOLOL 50 MG PO TABS
50.0000 mg | ORAL_TABLET | Freq: Every day | ORAL | Status: DC
  Administered 2011-11-26 – 2011-11-28 (×3): 50 mg via ORAL
  Filled 2011-11-25 (×3): qty 1

## 2011-11-25 MED ORDER — MAGNESIUM SULFATE IN D5W 10-5 MG/ML-% IV SOLN
1.0000 g | Freq: Once | INTRAVENOUS | Status: AC
  Administered 2011-11-25: 1 g via INTRAVENOUS
  Filled 2011-11-25: qty 100

## 2011-11-25 MED ORDER — ALPRAZOLAM 0.5 MG PO TABS
0.5000 mg | ORAL_TABLET | Freq: Three times a day (TID) | ORAL | Status: DC | PRN
  Administered 2011-11-26: 0.5 mg via ORAL
  Filled 2011-11-25: qty 1

## 2011-11-25 MED ORDER — POTASSIUM CHLORIDE CRYS ER 20 MEQ PO TBCR
40.0000 meq | EXTENDED_RELEASE_TABLET | Freq: Once | ORAL | Status: AC
  Administered 2011-11-25: 40 meq via ORAL
  Filled 2011-11-25: qty 2

## 2011-11-25 MED ORDER — FUROSEMIDE 40 MG PO TABS
40.0000 mg | ORAL_TABLET | Freq: Two times a day (BID) | ORAL | Status: DC
  Administered 2011-11-26 – 2011-11-28 (×5): 40 mg via ORAL
  Filled 2011-11-25 (×7): qty 1

## 2011-11-25 MED ORDER — PROMETHAZINE HCL 25 MG PO TABS
25.0000 mg | ORAL_TABLET | Freq: Two times a day (BID) | ORAL | Status: DC | PRN
  Administered 2011-11-27 (×2): 25 mg via ORAL
  Filled 2011-11-25 (×2): qty 1

## 2011-11-25 MED ORDER — ENOXAPARIN SODIUM 40 MG/0.4ML ~~LOC~~ SOLN
40.0000 mg | SUBCUTANEOUS | Status: DC
  Administered 2011-11-25 – 2011-11-27 (×3): 40 mg via SUBCUTANEOUS
  Filled 2011-11-25 (×4): qty 0.4

## 2011-11-25 MED ORDER — ACETAMINOPHEN 650 MG RE SUPP: 650.0000 mg | Freq: Four times a day (QID) | RECTAL | Status: DC | PRN

## 2011-11-25 NOTE — H&P (Signed)
[facility] History and Physical  [name] BJY:[id number] DOB: 09-18-1940 DOA: 11/25/2011   PCP: Noralee Space, MD   Chief Complaint:  Chief Complaint  Patient presents with  . Loss of Consciousness     HPI:  [name] is a 71 year old woman with diabetes, chronic diastolic congestive heart failure, coronary artery disease who passed out in the parking lot. Her syncopal event was witnessed by the emergency room physician who was walking in for his shift. According to the patient she felt lousy for the past couple weeks and she has been having intermittent chest pains. The patient does not recall having chest pain before passing out. She was brought into the emergency room where her initial vitals were within normal limits. Initially the patient felt that she couldn't move all over but then she was able to stand and walk. She does not have currently any chest pain. Evaluation in the emergency room revealed some mild hypokalemia and mild hypomagnesemia and no acute EKG changes. The patient is referred for observation for an unexplained syncopal event.   Review of Systems:  Positive for aches and pains all over consistent with fibromyalgia, positive for chronic back pain from a pinched nerve, positive for chronic diarrhea from ulcerative colitis, positive for anxiety and depression symptoms, positive for chronic fatigue. All other systems reviewed and negative or as per HPI  Past Medical History  Diagnosis Date  . CHF (congestive heart failure)     Diastolic  . Volume overload   . Bronchiectasis   . Hypertension   . CAD (coronary artery disease)     BMS to the LAD, 2002 / catheterization 2009 LAD stents patent with otherwise mild disease; Managed medically.S/P myoview December 2012 felt to probably be normal.   . Diabetes mellitus   . Obesity   . GERD (gastroesophageal reflux disease)   . Fibromyalgia   . Anxiety   . Allergy, unspecified not elsewhere classified   .  Pulmonary nodule     Negative PET in 2010  . Other diseases of lung, not elsewhere classified   . Hypercholesterolemia   . Diverticulosis of colon   . Ulcerative colitis, left sided 2008    Hx of  . Adenomatous colon polyp 02/2006  . Microscopic hematuria   . Neoplasm of kidney   . Stress incontinence, female   . DJD (degenerative joint disease)   . Vitamin D deficiency disease   . Anemia   . Asthma    Past Surgical History  Procedure Date  . Cholecystectomy   . Hand surgery 01/2006    Right Hand Surgery by Dr. Amedeo Plenty  . Coronary stent placement 2002    BMS to the LAD  . Abdominal hysterectomy    Social History:  reports that she quit smoking about 38 years ago. Her smoking use included Cigarettes. She has a 30 pack-year smoking history. She has never used smokeless tobacco. She reports that she drinks alcohol. She reports that she does not use illicit drugs. Patient lives at home with her older son and is independent with her adls    Allergies  Allergen Reactions  . Aspirin   . Azithromycin     REACTION: pt states "ZPak doesn't work"  . Ciprofloxacin   . Codeine   . Levofloxacin     REACTION: chest pain  . Oxycodone-Acetaminophen     REACTION: vomiting  . Tramadol Hcl     REACTION: pt states "spaced-out"    Family History  Problem Relation  Age of Onset  . Pneumonia Father   . Heart attack Father   . Parkinsonism Mother   . Sarcoidosis Brother   . Hypertension Sister   . Diabetes Sister   . Colon cancer Neg Hx      Prior to Admission medications   Medication Sig Start Date End Date Taking? Authorizing Provider  albuterol (PROVENTIL) (2.5 MG/3ML) 0.083% nebulizer solution Take 2.5 mg by nebulization 3 (three) times daily as needed. 08/06/11  Yes Noralee Space, MD  ALPRAZolam Duanne Moron) 0.5 MG tablet Take 0.5 mg by mouth 3 (three) times daily as needed. anxiety 08/03/11  Yes Noralee Space, MD  amLODipine (NORVASC) 10 MG tablet Take 10 mg by mouth daily.   Yes  Historical Provider, MD  atenolol (TENORMIN) 50 MG tablet Take 50 mg by mouth daily.   Yes Historical Provider, MD  Cholecalciferol (VITAMIN D-3) 5000 UNITS TABS Take 5,000 Units by mouth daily.     Yes Historical Provider, MD  clopidogrel (PLAVIX) 75 MG tablet Take 75 mg by mouth daily.   Yes Historical Provider, MD  furosemide (LASIX) 40 MG tablet Take 40 mg by mouth 3 (three) times daily.   Yes Historical Provider, MD  glimepiride (AMARYL) 1 MG tablet Take 1 mg by mouth daily before breakfast. 08/06/11  Yes Noralee Space, MD  guaiFENesin (MUCINEX) 600 MG 12 hr tablet 600 mg. AS NEEDED   Yes Historical Provider, MD  hydrocodone-acetaminophen (LORCET-HD) 5-500 MG per capsule Take 1 capsule by mouth 2 (two) times daily as needed. 08/03/11  Yes Noralee Space, MD  hydrocortisone-pramoxine [facility]) 2.5-1 % rectal cream Place rectally as needed. APPLY A SMALL AMOUNT TO AFFECTED AREA.   Yes Historical Provider, MD  isosorbide mononitrate (IMDUR) 60 MG 24 hr tablet Take 60 mg by mouth daily. 04/10/11  Yes Burtis Junes, NP  losartan (COZAAR) 50 MG tablet Take 1 tablet (50 mg total) by mouth daily. For blood pressure 01/02/11 01/02/12 Yes Noralee Space, MD  mesalamine (ASACOL) 400 MG EC tablet Take 1,600 mg by mouth 3 (three) times daily. 11/08/10  Yes Ladene Artist, MD,FACG  mesalamine (CANASA) 1000 MG suppository Place 1,000 mg rectally as needed.   11/08/10  Yes Ladene Artist, MD,FACG  nabumetone (RELAFEN) 750 MG tablet Take 1 tablet (750 mg total) by mouth 2 (two) times daily with a meal. 10/17/11  Yes Noralee Space, MD  Naftifine HCl (NAFTIN) 2 % CREA Apply topically 2 (two) times daily.   Yes Historical Provider, MD  NITROSTAT 0.4 MG SL tablet 1 TABLET UNDER TONGUE AT ONSET OF CHEST PAIN YOU MAY REPEAT EVERY 5 MINUTES FOR UP TO 3 DOSES. 11/03/10  Yes Carlena Bjornstad, MD  omeprazole (PRILOSEC) 40 MG capsule Take 40 mg by mouth daily.     Yes Historical Provider, MD  potassium chloride SA (K-DUR,KLOR-CON)  20 MEQ tablet Take 20 mEq by mouth daily.     Yes Historical Provider, MD  promethazine (PHENERGAN) 25 MG tablet Take 25 mg by mouth 2 (two) times daily as needed. For nausea 01/02/11  Yes Noralee Space, MD  amoxicillin-clavulanate (AUGMENTIN) 875-125 MG per tablet Take 1 tablet by mouth as needed.   03/27/11 04/10/11  Noralee Space, MD  amoxicillin-clavulanate (AUGMENTIN) 875-125 MG per tablet Take 1 tablet by mouth 2 (two) times daily. 05/08/11 05/22/11  Noralee Space, MD  amoxicillin-clavulanate (AUGMENTIN) 875-125 MG per tablet Take 1 tablet by mouth 2 (two) times daily. 08/03/11 08/17/11  Noralee Space, MD   Physical Exam: Filed Vitals:   11/25/11 1718 11/25/11 1720 11/25/11 1721 11/25/11 1924  BP: 145/87 148/93 147/81 140/75  Pulse:    63  Temp:      TempSrc:      Resp:    16  SpO2:    95%     General:  Alert and oriented x3  Eyes: perrla, eomi,   ENT: external ears normal appearing ,  Clear pharynx, nares normal   Neck: no thyromegaly, no JVD  Cardiovascular: RRR, nl S1, S2, no M, R,G  - distant   Respiratory: CTAB, no W,R,C  Abdomen: soft, obese, no palpable hepatomegaly,   Skin: no rash,   Musculoskeletal: no gros abnormalities, +1 pitting edema calf    Psychiatric: labile but able to focus and answering questions   Neurologic: CN 2-12 intact, strength is 4/5 all 4 extremities mainly effort related, sensation is intact, visual fields intact, finger to nose intact, DTRs +1 intact   Labs on Admission:  Basic Metabolic Panel:  Lab 78/24/23 1600  NA 137  K 3.2*  CL 97  CO2 27  GLUCOSE 134*  BUN 14  CREATININE 0.38*  CALCIUM 9.6  MG 1.2*  PHOS 3.7   Liver Function Tests: No results found for this basename: AST:5,ALT:5,ALKPHOS:5,BILITOT:5,PROT:5,ALBUMIN:5 in the last 168 hours No results found for this basename: LIPASE:5,AMYLASE:5 in the last 168 hours No results found for this basename: AMMONIA:5 in the last 168 hours CBC:  Lab 11/25/11 1600  WBC 10.4    NEUTROABS 7.3  HGB 12.0  HCT 36.0  MCV 89.8  PLT 405*   Cardiac Enzymes:  Lab 11/25/11 1600  CKTOTAL --  CKMB --  CKMBINDEX --  TROPONINI <0.30    BNP (last 3 results)  Basename 11/25/11 1600 08/03/11 1305  PROBNP 68.4 42.0   CBG:  Lab 11/25/11 1514  GLUCAP 136*    Radiological Exams on Admission: Dg Chest 2 View  11/25/2011  *RADIOLOGY REPORT*  Clinical Data: Shortness of breath.  CHEST - 2 VIEW  Comparison: 08/03/2011.  Findings: The heart is enlarged but stable.  The mediastinal and hilar contours are unchanged.  There is vascular congestion and probable mild interstitial pulmonary edema.  No pleural effusion or focal infiltrate.  The bony thorax is intact.  IMPRESSION: Cardiac enlargement with vascular congestion and probable mild interstitial pulmonary edema.  Original Report Authenticated By: P. Kalman Jewels, [name]   Ct Head Wo Contrast  11/25/2011  *RADIOLOGY REPORT*  Clinical Data: Loss of consciousness.  Fell.  CT HEAD WITHOUT CONTRAST  Technique:  Contiguous axial images were obtained from the base of the skull through the vertex without contrast.  Comparison: 06/25/2009.  Findings: The ventricles are normal and stable for age.  There is fairly significant periventricular white matter disease but this appears stable also.  No acute intracranial findings such as hemispheric infarction or intracranial hemorrhage.  No mass lesions.  The brainstem and cerebellum appear normal and stable.  The bony structures are intact.  No skull fracture.  Stable tiny radiopaque foreign bodies in the scalp at the posterior apex.  IMPRESSION:  1.  No acute intracranial findings. 2.  Stable advanced periventricular white matter disease.  Original Report Authenticated By: P. Kalman Jewels, [name]    EKG: Independently reviewed. No Q waves, NSR , no ST changes, negative T waves in V2 - isolated   Assessment/Plan Principal Problem:  *Syncope and collapse Active Problems:  DIABETES  MELLITUS  ANXIETY  DEPRESSION  GERD  FIBROMYALGIA  Hypertension  CAD (coronary artery disease)  Nonischemic cardiomyopathy  Ulcerative colitis  Chronic diastolic CHF (congestive heart failure)  Lower extremity edema  Hypokalemia  Hypomagnesemia   1. Syncope - unclear etiology - suspect vaso-vagal phenomenon in the setting of stress, heat and low K and Mg. Not orthostatic in the ED, no neuro deficits on physical exam, has normal EF at [facility] in December 2012. Would observe on tele in house for 23 hours and repeat cardiac enzymes. Seizure is unlikely given quick recovery plus episode was witnessed by ED MD - Kathrynn Humble -  and he did not think it was compatible with grand mal seizure. Patient has been on the same meds for a long time. Would ambulate in hallways with PT tomorrow. 2. Hypomagnesemia and Hypokalemia - probably due to Lasix 40 TID and chronic diarrhea -  Plan to replete iv and po and recheck tomorrow. Will decrease the dose of lasix to BID - see discussion below 3. Chronic diastolic CHF - suspect that the patient may have lower extremity edema from Norvasc. Doubt CHF exacerbation with normal proBNP. Will Dc Norvasc and decrease dose of Lasix. 4. Hx of CAD with remote stenting - no active signs of ischemia - c/w plavix and imdur. Had normal Myoview in 03/2011 5. DM 2 - last A1C 3 months ago was 8.2 = resume Amaryl - recheck A1C - novolog TID in house 6. Fatigue - suspect due to polypharmacy, multiple co-morbidities, etc - recheck also TSH 7. GERD - resume PPI 8. Ulcerative colitis - on maintenance Asacol TID 9. DVt px - Lovenox   Code Status: full Family Communication: son at bedside Disposition Plan: home  Time spent: 1 hour  Oliwia Berzins [facility] Pager [phone]  If 7PM-7AM, please contact night-coverage [email or web address] Password [facility] 11/25/2011, 7:43 PM

## 2011-11-25 NOTE — ED Notes (Signed)
Pt was in parking lot getting into car, passed out. Staff helped pt into ED. Pt reports back of head hit pavement, +loc. Reports hx of loc. Reports "sharp" pain on left side of chest.

## 2011-11-25 NOTE — ED Notes (Signed)
Report called to floor RN

## 2011-11-25 NOTE — ED Provider Notes (Addendum)
History     CSN: [id number]  Arrival date & time 11/25/11  1509   First MD Initiated Contact with Patient 11/25/11 1525      Chief Complaint  Patient presents with  . Loss of Consciousness    (Consider location/radiation/quality/duration/timing/severity/associated sxs/prior treatment) HPI Comments: Pt cis a 71 y/o female who comes in with cc of syncope. She has known CAD with prior BMS to the LAD in 5284, diastolic CHF (EF - 13%), on lasix and HTN, DM2. Pt comes in after a witnessed syncopal episode right outside the ED, as her mother was being transferred to [facility] health. Pt reports no chest pain, sob, palpitations, dizziness, lightheadedness, diaphoresis prior to her passing out. She states that she just has generalized weakness x 1 week. She has no headaches, n/v/f/c/abd pain, back pain. Pt has no recent hx of syncope. She does indicate that she has noted some weight qain over the past few days despite compliance with her meds. She has no change in orthopnea and no new PND. No hx of PE.  Patient is a 71 y.o. female presenting with syncope. The history is provided by the patient and medical records.  Loss of Consciousness Pertinent negatives include no chest pain, no abdominal pain, no headaches and no shortness of breath.    Past Medical History  Diagnosis Date  . CHF (congestive heart failure)     Diastolic  . Volume overload   . Bronchiectasis   . Hypertension   . CAD (coronary artery disease)     BMS to the LAD, 2002 / catheterization 2009 LAD stents patent with otherwise mild disease; Managed medically.S/P myoview December 2012 felt to probably be normal.   . Diabetes mellitus   . Obesity   . GERD (gastroesophageal reflux disease)   . Fibromyalgia   . Anxiety   . Allergy, unspecified not elsewhere classified   . Pulmonary nodule     Negative PET in 2010  . Other diseases of lung, not elsewhere classified   . Hypercholesterolemia   . Diverticulosis of colon   .  Ulcerative colitis, left sided 2008    Hx of  . Adenomatous colon polyp 02/2006  . Microscopic hematuria   . Neoplasm of kidney   . Stress incontinence, female   . DJD (degenerative joint disease)   . Vitamin D deficiency disease   . Anemia   . Asthma     Past Surgical History  Procedure Date  . Cholecystectomy   . Hand surgery 01/2006    Right Hand Surgery by Dr. Amedeo Plenty  . Coronary stent placement 2002    BMS to the LAD  . Abdominal hysterectomy     Family History  Problem Relation Age of Onset  . Pneumonia Father   . Heart attack Father   . Parkinsonism Mother   . Sarcoidosis Brother   . Hypertension Sister   . Diabetes Sister   . Colon cancer Neg Hx     History  Substance Use Topics  . Smoking status: Former Smoker -- 2.0 packs/day for 15 years    Types: Cigarettes    Quit date: 04/30/1973  . Smokeless tobacco: Never Used   Comment: ex-smoker: smoked for 10-15 years up to 2ppd, quit 1975.  Marland Kitchen Alcohol Use: Yes     Social drinker    OB History    Grav Para Term Preterm Abortions TAB SAB Ect Mult Living  Review of Systems  Constitutional: Negative for activity change.  HENT: Negative for facial swelling, neck pain and neck stiffness.   Respiratory: Negative for cough, shortness of breath and wheezing.   Cardiovascular: Positive for syncope. Negative for chest pain.  Gastrointestinal: Negative for nausea, vomiting, abdominal pain, diarrhea, constipation, blood in stool and abdominal distention.  Genitourinary: Negative for dysuria, hematuria and difficulty urinating.  Skin: Negative for color change.  Neurological: Positive for weakness. Negative for dizziness, seizures, speech difficulty, light-headedness and headaches.  Hematological: Does not bruise/bleed easily.  Psychiatric/Behavioral: Negative for confusion.    Allergies  Aspirin; Azithromycin; Ciprofloxacin; Codeine; Levofloxacin; Oxycodone-acetaminophen; and Tramadol hcl  Home  Medications   Current Outpatient Rx  Name Route Sig Dispense Refill  . ALBUTEROL SULFATE (2.5 MG/3ML) 0.083% IN NEBU Nebulization Take 2.5 mg by nebulization 3 (three) times daily as needed.    . ALPRAZOLAM 0.5 MG PO TABS Oral Take 0.5 mg by mouth 3 (three) times daily as needed. anxiety    . AMLODIPINE BESYLATE 10 MG PO TABS Oral Take 10 mg by mouth daily.    . ATENOLOL 50 MG PO TABS Oral Take 50 mg by mouth daily.    Marland Kitchen VITAMIN D-3 5000 UNITS PO TABS Oral Take 5,000 Units by mouth daily.      Marland Kitchen CLOPIDOGREL BISULFATE 75 MG PO TABS Oral Take 75 mg by mouth daily.    . FUROSEMIDE 40 MG PO TABS Oral Take 40 mg by mouth 3 (three) times daily.    Marland Kitchen GLIMEPIRIDE 1 MG PO TABS Oral Take 1 mg by mouth daily before breakfast.    . GUAIFENESIN ER 600 MG PO TB12  600 mg. AS NEEDED    . HYDROCODONE-ACETAMINOPHEN 5-500 MG PO CAPS Oral Take 1 capsule by mouth 2 (two) times daily as needed.    Marland Kitchen HYDROCORTISONE ACE-PRAMOXINE 2.5-1 % RE CREA Rectal Place rectally as needed. APPLY A SMALL AMOUNT TO AFFECTED AREA.    . ISOSORBIDE MONONITRATE ER 60 MG PO TB24 Oral Take 60 mg by mouth daily.    Marland Kitchen LOSARTAN POTASSIUM 50 MG PO TABS Oral Take 1 tablet (50 mg total) by mouth daily. For blood pressure 30 tablet 11  . MESALAMINE 400 MG PO TBEC Oral Take 1,600 mg by mouth 3 (three) times daily.    Marland Kitchen MESALAMINE 1000 MG RE SUPP Rectal Place 1,000 mg rectally as needed.      Marland Kitchen NABUMETONE 750 MG PO TABS Oral Take 1 tablet (750 mg total) by mouth 2 (two) times daily with a meal. 60 tablet 0  . NAFTIFINE HCL 2 % EX CREA Apply externally Apply topically 2 (two) times daily.    Marland Kitchen NITROSTAT 0.4 MG SL SUBL  1 TABLET UNDER TONGUE AT ONSET OF CHEST PAIN YOU MAY REPEAT EVERY 5 MINUTES FOR UP TO 3 DOSES. 25 tablet 6  . OMEPRAZOLE 40 MG PO CPDR Oral Take 40 mg by mouth daily.      Marland Kitchen POTASSIUM CHLORIDE CRYS ER 20 MEQ PO TBCR Oral Take 20 mEq by mouth daily.      Marland Kitchen PROMETHAZINE HCL 25 MG PO TABS Oral Take 25 mg by mouth 2 (two) times daily  as needed. For nausea    . AMOXICILLIN-POT CLAVULANATE 875-125 MG PO TABS Oral Take 1 tablet by mouth as needed.      . AMOXICILLIN-POT CLAVULANATE 875-125 MG PO TABS Oral Take 1 tablet by mouth 2 (two) times daily. 14 tablet 0  . AMOXICILLIN-POT CLAVULANATE 875-125 MG  PO TABS Oral Take 1 tablet by mouth 2 (two) times daily. 14 tablet 0    BP 161/81  Pulse 75  Temp 98.8 F (37.1 C) (Oral)  Resp 16  SpO2 97%  Physical Exam  Nursing note and vitals reviewed. Constitutional: She is oriented to person, place, and time. She appears well-developed.  HENT:  Head: Normocephalic and atraumatic.  Eyes: Conjunctivae and EOM are normal. Pupils are equal, round, and reactive to light.  Neck: Normal range of motion. Neck supple.  Cardiovascular: Normal rate and regular rhythm.   Murmur heard. Pulmonary/Chest: Effort normal and breath sounds normal. No respiratory distress.       Fine bibasilar rales  Abdominal: Soft. Bowel sounds are normal. She exhibits no distension. There is no tenderness. There is no rebound and no guarding.  Musculoskeletal: She exhibits no edema and no tenderness.  Neurological: She is alert and oriented to person, place, and time.  Skin: Skin is warm and dry.    ED Course  Procedures (including critical care time)  Labs Reviewed  GLUCOSE, CAPILLARY - Abnormal; Notable for the following:    Glucose-Capillary 136 (*)     All other components within normal limits  CBC WITH DIFFERENTIAL - Abnormal; Notable for the following:    Platelets 405 (*)     All other components within normal limits  BASIC METABOLIC PANEL  PRO B NATRIURETIC PEPTIDE  TROPONIN I  URINALYSIS, ROUTINE W REFLEX MICROSCOPIC  MAGNESIUM  PHOSPHORUS   Dg Chest 2 View  11/25/2011  *RADIOLOGY REPORT*  Clinical Data: Shortness of breath.  CHEST - 2 VIEW  Comparison: 08/03/2011.  Findings: The heart is enlarged but stable.  The mediastinal and hilar contours are unchanged.  There is vascular  congestion and probable mild interstitial pulmonary edema.  No pleural effusion or focal infiltrate.  The bony thorax is intact.  IMPRESSION: Cardiac enlargement with vascular congestion and probable mild interstitial pulmonary edema.  Original Report Authenticated By: P. Kalman Jewels, [name]     No diagnosis found.    MDM  DDx includes: Orthostatic hypotension Stroke Vertebral artery dissection/stenosis Dysrhythmia PE Vasovagal/neurocardiogenic syncope Aortic stenosis Valvular disorder/Cardiomyopathy Anemia AAA  70 y/o with CHF, CAD had a witnessed syncopal episode while she was leaving the ED. The hx is not clearly suggestive of cardiac etiology, nor is it convincing for a vasovagal / neurocardiogenic cause for the syncope. Pt has no JVD, mild crackles, but feels that she has some weight gain with her CHF. Additionally, her ROS is positive for generalized weakness x 1 week, with all the other important constitutionals being negative. We will get syncope workup going and will also investigate to see if there is pneumonia, uti. Abd exam was benign, and patient's cerebellar exam is normal. Will require admission for 23 hour tele monitoring. Ekg is still pending.     Varney Biles, MD 11/25/11 1627   Date: 11/25/2011  Rate:63  Rhythm: normal sinus rhythm  QRS Axis: normal  Intervals: normal  ST/T Wave abnormalities: nonspecific T wave changes  Conduction Disutrbances:none  Narrative Interpretation:   Old EKG Reviewed: none available    Varney Biles, MD 11/25/11 1820

## 2011-11-25 NOTE — ED Notes (Signed)
Bedside report received from previous RN

## 2011-11-25 NOTE — ED Notes (Signed)
IV RN will be here in 45 mins- 1 hr

## 2011-11-25 NOTE — ED Notes (Signed)
Attempted to get orthostatic blood pressure and urine sample from pt.  Pt stated that her legs felt like lead and would not be able to do either one of the required tasks.

## 2011-11-25 NOTE — ED Notes (Signed)
IV insertion attempted by 2 RNs, IV RN paged

## 2011-11-26 ENCOUNTER — Other Ambulatory Visit: Payer: Self-pay | Admitting: Pulmonary Disease

## 2011-11-26 DIAGNOSIS — R319 Hematuria, unspecified: Secondary | ICD-10-CM | POA: Diagnosis not present

## 2011-11-26 DIAGNOSIS — R55 Syncope and collapse: Secondary | ICD-10-CM | POA: Diagnosis not present

## 2011-11-26 DIAGNOSIS — I5032 Chronic diastolic (congestive) heart failure: Secondary | ICD-10-CM | POA: Diagnosis not present

## 2011-11-26 DIAGNOSIS — I509 Heart failure, unspecified: Secondary | ICD-10-CM | POA: Diagnosis not present

## 2011-11-26 LAB — BASIC METABOLIC PANEL
CO2: 26 mEq/L (ref 19–32)
Chloride: 101 mEq/L (ref 96–112)
Glucose, Bld: 174 mg/dL — ABNORMAL HIGH (ref 70–99)
Sodium: 139 mEq/L (ref 135–145)

## 2011-11-26 LAB — GLUCOSE, CAPILLARY
Glucose-Capillary: 121 mg/dL — ABNORMAL HIGH (ref 70–99)
Glucose-Capillary: 111 mg/dL — ABNORMAL HIGH (ref 70–99)
Glucose-Capillary: 131 mg/dL — ABNORMAL HIGH (ref 70–99)

## 2011-11-26 LAB — TSH: TSH: 1.139 u[IU]/mL (ref 0.350–4.500)

## 2011-11-26 LAB — HEMOGLOBIN A1C
Hgb A1c MFr Bld: 7.1 % — ABNORMAL HIGH (ref <5.7)
Mean Plasma Glucose: 157 mg/dL — ABNORMAL HIGH (ref <117)

## 2011-11-26 LAB — CARDIAC PANEL(CRET KIN+CKTOT+MB+TROPI)
CK, MB: 1.7 ng/mL (ref 0.3–4.0)
Troponin I: <0.3 ng/mL (ref <0.30)

## 2011-11-26 LAB — MAGNESIUM: Magnesium: 1.6 mg/dL (ref 1.5–2.5)

## 2011-11-26 MED ORDER — GI COCKTAIL ~~LOC~~
30.0000 mL | Freq: Three times a day (TID) | ORAL | Status: DC | PRN
Start: 2011-11-26 — End: 2011-11-28
  Filled 2011-11-26: qty 30

## 2011-11-26 MED ORDER — MESALAMINE 400 MG PO CPDR
1600.0000 mg | DELAYED_RELEASE_CAPSULE | Freq: Three times a day (TID) | ORAL | Status: DC
  Administered 2011-11-27 – 2011-11-28 (×5): 1600 mg via ORAL
  Filled 2011-11-26 (×7): qty 4

## 2011-11-26 MED ORDER — POTASSIUM CHLORIDE CRYS ER 20 MEQ PO TBCR
60.0000 meq | EXTENDED_RELEASE_TABLET | Freq: Once | ORAL | Status: AC
  Administered 2011-11-26: 60 meq via ORAL
  Filled 2011-11-26 (×2): qty 3

## 2011-11-26 NOTE — Progress Notes (Signed)
[facility] PROGRESS NOTE  KETURAH YERBY OIN:[id number] DOB: 14-Aug-1940 DOA: 11/25/2011 PCP: Noralee Space, MD  Assessment/Plan: Principal Problem:  *Syncope and collapse Active Problems:  DIABETES MELLITUS  ANXIETY  DEPRESSION  GERD  FIBROMYALGIA  Hypertension  CAD (coronary artery disease)  Nonischemic cardiomyopathy  Ulcerative colitis  Chronic diastolic CHF (congestive heart failure)  Lower extremity edema  Hypokalemia  Hypomagnesemia   Syncopal episode -Appears like vasovagal attack from her presentation, I do not know what triggers the vasovagal attack. -She was not orthostatic, even with the hefty Lasix dose, renal function is okay.  Hypomagnesemia and hypokalemia -This is likely secondary to diuresis with Lasix and chronic diarrhea. -Hypomagnesemia repleted, I'll give potassium supplements today to correct the hypokalemia.  Chronic diastolic CHF -No symptoms or signs suggesting decompensation. -She is on Norvasc, Lasix, losartan, Imdur and atenolol.  Diabetes mellitus type 2 -Hemoglobin A1c is 7.1, continue current regimen. -While in the hospital insulin sliding scale and carbohydrate modified diet.  History of remote CABG and stenting -Patient did have chest pain while she is in the hospital, no evidence of ischemia on EKG and cardiac enzymes. -Sensory sets of cardiac enzymes, I think this is likely secondary to acid reflux, GI cocktail ordered.  Ulcerative colitis -On maintenance dose of Asacol and Canasa suppositories, denies any recent diarrhea.  Code Status: Full Family Communication:  Disposition Plan: Home when she is stable medically   Brief narrative: 71 year old African American female with past medical history include ulcerative colitis, hypertension, diabetes mellitus type 2 on chronic diastolic CHF. Patient was visiting her mother in the hospital when she collapsed in the hospital parking lot, she was witnessed by Dr. Kathrynn Humble one of  the ED physicians.  Consultants:   none  Procedures:   none  Antibiotics:   none  HPI/Subjective:  sitting at the side of the bed eating breakfast, denies any shortness of breath chest pain or dizziness. The nurse reported later to me that she mentioned some chest pain.  Objective: Filed Vitals:   11/25/11 1924 11/25/11 2108 11/26/11 0517 11/26/11 0945  BP: 140/75 151/92 125/65 118/69  Pulse: 63 68 71 63  Temp:  98.6 F (37 C) 98.7 F (37.1 C)   TempSrc:  Oral Oral   Resp: 16  18   Height:  _0  (1.575 m)    Weight:  96.48 kg (212 lb 11.2 oz)    SpO2: 95% 97% 92%     Intake/Output Summary (Last 24 hours) at 11/26/11 1335 Last data filed at 11/26/11 1218  Gross per 24 hour  Intake    460 ml  Output    776 ml  Net   -316 ml    Exam:  General: Alert and awake, oriented x3, not in any acute distress. HEENT: anicteric sclera, pupils reactive to light and accommodation, EOMI CVS: S1-S2 clear, no murmur rubs or gallops Chest: clear to auscultation bilaterally, no wheezing, rales or rhonchi Abdomen: soft nontender, nondistended, normal bowel sounds, no organomegaly Extremities: no cyanosis, clubbing or edema noted bilaterally Neuro: Cranial nerves II-XII intact, no focal neurological deficits  Data Reviewed: Basic Metabolic Panel:  Lab 70/96/28 0329 11/25/11 1600  NA 139 137  K 3.2* 3.2*  CL 101 97  CO2 26 27  GLUCOSE 174* 134*  BUN 13 14  CREATININE 0.35* 0.38*  CALCIUM 9.5 9.6  MG 1.6 1.2*  PHOS -- 3.7   Liver Function Tests: No results found for this basename: AST:5,ALT:5,ALKPHOS:5,BILITOT:5,PROT:5,ALBUMIN:5 in the last 168 hours No  results found for this basename: LIPASE:5,AMYLASE:5 in the last 168 hours No results found for this basename: AMMONIA:5 in the last 168 hours CBC:  Lab 11/25/11 1600  WBC 10.4  NEUTROABS 7.3  HGB 12.0  HCT 36.0  MCV 89.8  PLT 405*   Cardiac Enzymes:  Lab 11/26/11 0329 11/25/11 2155 11/25/11 1600  CKTOTAL 79 88  --  CKMB 1.7 1.8 --  CKMBINDEX -- -- --  TROPONINI <0.30 <0.30 <0.30   BNP (last 3 results)  Basename 11/25/11 1600 08/03/11 1305  PROBNP 68.4 42.0   CBG:  Lab 11/26/11 1202 11/26/11 0749 11/25/11 2116 11/25/11 1514  GLUCAP 131* 111* 121* 136*    No results found for this or any previous visit (from the past 240 hour(s)).   Studies: Dg Chest 2 View  11/25/2011  *RADIOLOGY REPORT*  Clinical Data: Shortness of breath.  CHEST - 2 VIEW  Comparison: 08/03/2011.  Findings: The heart is enlarged but stable.  The mediastinal and hilar contours are unchanged.  There is vascular congestion and probable mild interstitial pulmonary edema.  No pleural effusion or focal infiltrate.  The bony thorax is intact.  IMPRESSION: Cardiac enlargement with vascular congestion and probable mild interstitial pulmonary edema.  Original Report Authenticated By: P. Kalman Jewels, [name]   Ct Head Wo Contrast  11/25/2011  *RADIOLOGY REPORT*  Clinical Data: Loss of consciousness.  Fell.  CT HEAD WITHOUT CONTRAST  Technique:  Contiguous axial images were obtained from the base of the skull through the vertex without contrast.  Comparison: 06/25/2009.  Findings: The ventricles are normal and stable for age.  There is fairly significant periventricular white matter disease but this appears stable also.  No acute intracranial findings such as hemispheric infarction or intracranial hemorrhage.  No mass lesions.  The brainstem and cerebellum appear normal and stable.  The bony structures are intact.  No skull fracture.  Stable tiny radiopaque foreign bodies in the scalp at the posterior apex.  IMPRESSION:  1.  No acute intracranial findings. 2.  Stable advanced periventricular white matter disease.  Original Report Authenticated By: P. Kalman Jewels, [name]    Scheduled Meds:   . sodium chloride   Intravenous Once  . atenolol  50 mg Oral Daily  . cholecalciferol  5,000 Units Oral Daily  . clopidogrel  75 mg Oral QAC  breakfast  . enoxaparin (LOVENOX) injection  40 mg Subcutaneous Q24H  . furosemide  40 mg Oral BID  . glimepiride  1 mg Oral QAC breakfast  . insulin aspart  0-5 Units Subcutaneous QHS  . insulin aspart  0-9 Units Subcutaneous TID WC  . insulin aspart  3 Units Subcutaneous TID WC  . isosorbide mononitrate  60 mg Oral Daily  . losartan  50 mg Oral Daily  . magnesium chloride  1 tablet Oral BID  . magnesium sulfate 1 - 4 g bolus IVPB  1 g Intravenous Once  . mesalamine  1,600 mg Oral TID  . nabumetone  750 mg Oral BID WC  . pantoprazole  40 mg Oral Q1200  . potassium chloride  40 mEq Oral Once  . potassium chloride  40 mEq Oral Daily  . sodium chloride  3 mL Intravenous Q12H  . sodium chloride  3 mL Intravenous Q12H  . DISCONTD: magnesium sulfate  1 g Intravenous Once   Continuous Infusions:   Principal Problem:  *Syncope and collapse Active Problems:  DIABETES MELLITUS  ANXIETY  DEPRESSION  GERD  FIBROMYALGIA  Hypertension  CAD (  coronary artery disease)  Nonischemic cardiomyopathy  Ulcerative colitis  Chronic diastolic CHF (congestive heart failure)  Lower extremity edema  Hypokalemia  Hypomagnesemia    Time spent: 40 minutes    [facility] Pager [phone]. If 8PM-8AM, please contact night-coverage at [email or web address], password [facility] 11/26/2011, 1:35 PM  LOS: 1 day

## 2011-11-26 NOTE — Evaluation (Signed)
Physical Therapy Evaluation Patient Details Name: [name] MRN: [id number] DOB: 18-Oct-1940 Today's Date: 11/26/2011 Time: 6333-5456 PT Time Calculation (min): 32 min  PT Assessment / Plan / Recommendation Clinical Impression  Pt presents with syncope and collapse with some chest pain.  Pt states she has some chest pain upon PT entering room.  RN notified and EKG performed (normal sinus rhythm).  Ambulated in hallway with no AD with noted instability throughout with near LOB near end of ambulation.  BP supine read 120/68, sitting EOB 128/85, standing 147/72  and at near LOB was 150/75.  Pt with only slight c/o dizziness once seated in recliner at nurses station.  RN made aware of BPs.  Pt will benefit from skilled PT in acute venue to address deficits.  PT recommends HHPT for follow up therapy at D/C to increase pt safety as long as pt can get 24/7 supervision, at least initially.      PT Assessment  Patient needs continued PT services    Follow Up Recommendations  Home health PT;Supervision for mobility/OOB    Barriers to Discharge   Pt states that someone can be with her most of the time.     Equipment Recommendations  None recommended by PT    Recommendations for Other Services     Frequency Min 3X/week    Precautions / Restrictions Precautions Precautions: Fall Restrictions Weight Bearing Restrictions: No   Pertinent Vitals/Pain 4/10 chest pain, RN notified and EKG performed=normal sinus rhythm      Mobility  Bed Mobility Bed Mobility: Supine to Sit Supine to Sit: 6: Modified independent (Device/Increase time) Details for Bed Mobility Assistance: increased time Transfers Transfers: Sit to Stand;Stand to Sit Sit to Stand: 5: Supervision;From bed;With upper extremity assist Stand to Sit: With upper extremity assist;With armrests;To chair/3-in-1;4: Min assist Details for Transfer Assistance: Supervision for safety when standing, however when ambulating, noted near  LOB/unsteadiness, therefore assisted pt safely to chair to check vitals.  Cues for hand placement and safety.  Ambulation/Gait Ambulation/Gait Assistance: 4: Min assist;3: Mod assist Ambulation Distance (Feet): 320 Feet Assistive device: None Ambulation/Gait Assistance Details: Ambulated in hallway with no AD.  Noted some instability throughout with no c/o dizziness, chest pain, or headaches until almost done with ambulation and noted near LOB.  Assisted pt to nursing station to hold counter with therapist assist on other side while chair was retrieved from room.  Pt states slight lightheadedness, but just "off" more than anything.  No c/o chest pain or headache.   Gait Pattern: Step-to pattern;Decreased stride length Gait velocity: increased somewhat with cues for decreased gait speed to increase safety.  Stairs: No Wheelchair Mobility Wheelchair Mobility: No    Exercises     PT Diagnosis: Abnormality of gait;Generalized weakness;Acute pain  PT Problem List: Decreased balance;Decreased mobility;Cardiopulmonary status limiting activity PT Treatment Interventions: DME instruction;Gait training;Stair training;Functional mobility training;Therapeutic activities;Therapeutic exercise;Balance training;Patient/family education   PT Goals Acute Rehab PT Goals PT Goal Formulation: With patient Time For Goal Achievement: 12/03/11 Potential to Achieve Goals: Good Pt will Ambulate: >150 feet;with modified independence;with least restrictive assistive device PT Goal: Ambulate - Progress: Goal set today Pt will Go Up / Down Stairs: 1-2 stairs;with modified independence;with least restrictive assistive device PT Goal: Up/Down Stairs - Progress: Goal set today Pt will Perform Home Exercise Program: with supervision, verbal cues required/provided PT Goal: Perform Home Exercise Program - Progress: Goal set today  Visit Information  Last PT Received On: 11/26/11 Assistance Needed: +1  Subjective  Data  Subjective: I didn't sleep well last night Patient Stated Goal: to get better   Prior Functioning  Home Living Lives With: Son Available Help at Discharge: Family Type of Home: House Home Access: Stairs to enter Technical brewer of Steps: 1 Home Layout: One level Bathroom Shower/Tub: Tub only Home Adaptive Equipment: Straight cane;Shower chair with back Additional Comments: Pt states she has had several falls in the past, unable to state reason just that she "falls" Prior Function Level of Independence: Independent with assistive device(s) Able to Take Stairs?: Yes Driving: Yes Communication Communication: No difficulties    Cognition  Overall Cognitive Status: Appears within functional limits for tasks assessed/performed Arousal/Alertness: Awake/alert Orientation Level: Appears intact for tasks assessed Behavior During Session: [facility] for tasks performed    Extremity/Trunk Assessment Right Lower Extremity Assessment RLE ROM/Strength/Tone: [facility] for tasks assessed RLE Coordination: WFL - gross/fine motor Left Lower Extremity Assessment LLE ROM/Strength/Tone: WFL for tasks assessed LLE Coordination: WFL - gross/fine motor Trunk Assessment Trunk Assessment: Kyphotic   Balance    End of Session PT - End of Session Activity Tolerance: Other (comment) (limited by some dizziness/lightheadedness) Patient left: in chair;with call bell/phone within reach Nurse Communication: Mobility status;Other (comment) (Pts BP while up/ambulating. )  GP Functional Assessment Tool Used: clinical judgement Functional Limitation: Mobility: Walking and moving around Mobility: Walking and Moving Around Current Status (B3435): At least 20 percent but less than 40 percent impaired, limited or restricted Mobility: Walking and Moving Around Goal Status [id number]): At least 1 percent but less than 20 percent impaired, limited or restricted   Gaye Pollack 11/26/2011, 11:35 AM

## 2011-11-26 NOTE — Progress Notes (Signed)
INITIAL ADULT NUTRITION ASSESSMENT Date: 11/26/2011   Time: 3:25 PM Reason for Assessment: Nutrition risk   INTERVENTION: Recommend SLP evaluation as pt reports frequently getting choked on water. Will monitor.   ASSESSMENT: Female 71 y.o.  Dx: Syncope and collapse  Food/Nutrition Related Hx: Pt admitted with syncope and collapse. Pt reports eating less at home recently r/t having chronic diarrhea from ulcerative colitis. Pt unable to quantify how much less she has been eating, but did say that her family has been encouraging her to eat more. Pt states she does not want any nutritional supplements at this time for fear it will make her diarrhea worse. Pt denies any problems chewing but does states she gets "choked on water" PTA and has had some problems with this during admission. Pt ate 100% of lunch today. Pt with moderate pitting RLE and LLE edema. Pt's wight down 4 pounds since last month, however unclear how much of pt's weight loss was r/t fluid.   Hx:  Past Medical History  Diagnosis Date  . CHF (congestive heart failure)     Diastolic  . Volume overload   . Bronchiectasis   . Hypertension   . CAD (coronary artery disease)     BMS to the LAD, 2002 / catheterization 2009 LAD stents patent with otherwise mild disease; Managed medically.S/P myoview December 2012 felt to probably be normal.   . Diabetes mellitus   . Obesity   . GERD (gastroesophageal reflux disease)   . Fibromyalgia   . Anxiety   . Allergy, unspecified not elsewhere classified   . Pulmonary nodule     Negative PET in 2010  . Other diseases of lung, not elsewhere classified   . Hypercholesterolemia   . Diverticulosis of colon   . Ulcerative colitis, left sided 2008    Hx of  . Adenomatous colon polyp 02/2006  . Microscopic hematuria   . Neoplasm of kidney   . Stress incontinence, female   . DJD (degenerative joint disease)   . Vitamin D deficiency disease   . Anemia   . Asthma    Related Meds:    Scheduled Meds:   . sodium chloride   Intravenous Once  . atenolol  50 mg Oral Daily  . cholecalciferol  5,000 Units Oral Daily  . clopidogrel  75 mg Oral QAC breakfast  . enoxaparin (LOVENOX) injection  40 mg Subcutaneous Q24H  . furosemide  40 mg Oral BID  . glimepiride  1 mg Oral QAC breakfast  . insulin aspart  0-5 Units Subcutaneous QHS  . insulin aspart  0-9 Units Subcutaneous TID WC  . insulin aspart  3 Units Subcutaneous TID WC  . isosorbide mononitrate  60 mg Oral Daily  . losartan  50 mg Oral Daily  . magnesium chloride  1 tablet Oral BID  . magnesium sulfate 1 - 4 g bolus IVPB  1 g Intravenous Once  . mesalamine  1,600 mg Oral TID  . nabumetone  750 mg Oral BID WC  . pantoprazole  40 mg Oral Q1200  . potassium chloride  40 mEq Oral Once  . potassium chloride  40 mEq Oral Daily  . potassium chloride  60 mEq Oral Once  . sodium chloride  3 mL Intravenous Q12H  . sodium chloride  3 mL Intravenous Q12H  . DISCONTD: magnesium sulfate  1 g Intravenous Once   Continuous Infusions:  PRN Meds:.sodium chloride, acetaminophen, acetaminophen, albuterol, ALPRAZolam, gi cocktail, HYDROcodone-acetaminophen, promethazine, sodium chloride, DISCONTD: alum & mag  hydroxide-simeth  Ht: _0  (157.5 cm)  Wt: 212 lb 11.2 oz (96.48 kg)  Ideal Wt: 110 lb % Ideal Wt: 192  Usual Wt: 216 lb  % Usual Wt: 98   Body mass index is 38.90 kg/(m^2). Class II obesity  Labs:  CMP     Component Value Date/Time   NA 139 11/26/2011 0329   K 3.2* 11/26/2011 0329   CL 101 11/26/2011 0329   CO2 26 11/26/2011 0329   GLUCOSE 174* 11/26/2011 0329   BUN 13 11/26/2011 0329   CREATININE 0.35* 11/26/2011 0329   CALCIUM 9.5 11/26/2011 0329   PROT 8.1 08/03/2011 1305   ALBUMIN 4.4 08/03/2011 1305   AST 16 08/03/2011 1305   ALT 5 08/03/2011 1305   ALKPHOS 61 08/03/2011 1305   BILITOT 0.6 08/03/2011 1305   GFRNONAA >90 11/26/2011 0329   GFRAA >90 11/26/2011 0329   Lab Results  Component Value Date   HGBA1C 7.1*  11/25/2011   CBG (last 3)   Basename 11/26/11 1202 11/26/11 0749 11/25/11 2116  GLUCAP 131* 111* 121*    Intake/Output Summary (Last 24 hours) at 11/26/11 1533 Last data filed at 11/26/11 1421  Gross per 24 hour  Intake    940 ml  Output    776 ml  Net    164 ml   Last BM - 11/26/11  Diet Order: Carb Control  IVF:    Estimated Nutritional Needs:   ULGS:[id number] Protein:50-60g Fluid:1.5-1.6L  NUTRITION DIAGNOSIS: -Swallowing difficulty (NI-1.1).  Status: Ongoing  RELATED TO: unknown origin  AS EVIDENCE BY: pt report   MONITORING/EVALUATION(Goals): Resolution/management of pt's reported dysphagia with liquids.   EDUCATION NEEDS: -Education needs addressed - provided handouts that review nutrition therapy for ulcerative colitis and heart failure with RD contact information.    Dietitian #: [phone]  [place] Per approved criteria  -Obesity Unspecified    Glory Rosebush 11/26/2011, 3:25 PM

## 2011-11-26 NOTE — Progress Notes (Signed)
Patient reported to PT that she had some chest pain earlier. Patient is not having cp now. EKG performed. ekg revealed NSR. VS stable. Md notified. Orders were placed for GI cocktail.  Will continue to monitor patient. Setzer, Marchelle Folks

## 2011-11-27 ENCOUNTER — Observation Stay (HOSPITAL_COMMUNITY): Payer: Medicare Other

## 2011-11-27 DIAGNOSIS — R319 Hematuria, unspecified: Secondary | ICD-10-CM | POA: Diagnosis not present

## 2011-11-27 DIAGNOSIS — I509 Heart failure, unspecified: Secondary | ICD-10-CM

## 2011-11-27 DIAGNOSIS — I5032 Chronic diastolic (congestive) heart failure: Secondary | ICD-10-CM | POA: Diagnosis not present

## 2011-11-27 DIAGNOSIS — I10 Essential (primary) hypertension: Secondary | ICD-10-CM | POA: Diagnosis not present

## 2011-11-27 DIAGNOSIS — R109 Unspecified abdominal pain: Secondary | ICD-10-CM | POA: Diagnosis not present

## 2011-11-27 DIAGNOSIS — E119 Type 2 diabetes mellitus without complications: Secondary | ICD-10-CM

## 2011-11-27 DIAGNOSIS — N281 Cyst of kidney, acquired: Secondary | ICD-10-CM | POA: Diagnosis not present

## 2011-11-27 DIAGNOSIS — R112 Nausea with vomiting, unspecified: Secondary | ICD-10-CM | POA: Diagnosis not present

## 2011-11-27 DIAGNOSIS — R55 Syncope and collapse: Secondary | ICD-10-CM | POA: Diagnosis not present

## 2011-11-27 DIAGNOSIS — R160 Hepatomegaly, not elsewhere classified: Secondary | ICD-10-CM | POA: Diagnosis not present

## 2011-11-27 LAB — BASIC METABOLIC PANEL
BUN: 11 mg/dL (ref 6–23)
CO2: 26 mEq/L (ref 19–32)
Chloride: 103 mEq/L (ref 96–112)
Creatinine, Ser: 0.43 mg/dL — ABNORMAL LOW (ref 0.50–1.10)
Glucose, Bld: 129 mg/dL — ABNORMAL HIGH (ref 70–99)

## 2011-11-27 LAB — URINALYSIS, ROUTINE W REFLEX MICROSCOPIC
Bilirubin Urine: NEGATIVE
Glucose, UA: NEGATIVE mg/dL
Ketones, ur: NEGATIVE mg/dL
Nitrite: POSITIVE — AB
Protein, ur: 30 mg/dL — AB
Specific Gravity, Urine: 1.014 (ref 1.005–1.030)
Urobilinogen, UA: 0.2 mg/dL (ref 0.0–1.0)
pH: 6.5 (ref 5.0–8.0)

## 2011-11-27 LAB — CBC
HCT: 35.3 % — ABNORMAL LOW (ref 36.0–46.0)
MCH: 29.6 pg (ref 26.0–34.0)
MCHC: 32.6 g/dL (ref 30.0–36.0)
MCV: 91 fL (ref 78.0–100.0)
RDW: 14.3 % (ref 11.5–15.5)
WBC: 10 10*3/uL (ref 4.0–10.5)

## 2011-11-27 LAB — GLUCOSE, CAPILLARY
Glucose-Capillary: 142 mg/dL — ABNORMAL HIGH (ref 70–99)
Glucose-Capillary: 125 mg/dL — ABNORMAL HIGH (ref 70–99)

## 2011-11-27 LAB — URINE MICROSCOPIC-ADD ON

## 2011-11-27 MED ORDER — PHENAZOPYRIDINE HCL 200 MG PO TABS
200.0000 mg | ORAL_TABLET | Freq: Once | ORAL | Status: AC
  Administered 2011-11-27: 200 mg via ORAL
  Filled 2011-11-27: qty 1

## 2011-11-27 MED ORDER — ONDANSETRON HCL 4 MG/2ML IJ SOLN
4.0000 mg | Freq: Four times a day (QID) | INTRAMUSCULAR | Status: DC | PRN
  Administered 2011-11-27: 4 mg via INTRAVENOUS
  Filled 2011-11-27: qty 2

## 2011-11-27 MED ORDER — DEXTROSE 5 % IV SOLN
1.0000 g | INTRAVENOUS | Status: DC
  Administered 2011-11-27 – 2011-11-28 (×2): 1 g via INTRAVENOUS
  Filled 2011-11-27 (×3): qty 10

## 2011-11-27 NOTE — Progress Notes (Signed)
[facility] PROGRESS NOTE  [name] WKG:[id number] DOB: July 04, 1940 DOA: 11/25/2011 PCP: Noralee Space, MD  Assessment/Plan: Principal Problem:  *Syncope and collapse Active Problems:  DIABETES MELLITUS  ANXIETY  DEPRESSION  GERD  FIBROMYALGIA  Hypertension  CAD (coronary artery disease)  Nonischemic cardiomyopathy  Ulcerative colitis  Chronic diastolic CHF (congestive heart failure)  Lower extremity edema  Hypokalemia  Hypomagnesemia   Syncopal episode -Appears like vasovagal attack from her presentation, I do not know what triggers the vasovagal attack. -She was not orthostatic, even with the hefty Lasix dose, renal function is okay. -This is resolved, patient back to her baseline.  Hematuria -Patient said she has sharp pain when she urinates, urinalysis showed hematuria. -Patient started on Rocephin empirically, I'll obtained renal ultrasound. -Patient has history of renal cysts, if needed her urologist is Dr. Jeffie Pollock.  Hypomagnesemia and hypokalemia -This is likely secondary to diuresis with Lasix and chronic diarrhea. -Hypomagnesemia repleted, I'll give potassium supplements today to correct the hypokalemia.  Chronic diastolic CHF -No symptoms or signs suggesting decompensation. -She is on Norvasc, Lasix, losartan, Imdur and atenolol.  Diabetes mellitus type 2 -Hemoglobin A1c is 7.1, continue current regimen. -While in the hospital insulin sliding scale and carbohydrate modified diet.  History of remote CABG and stenting -Patient did have chest pain while she is in the hospital, no evidence of ischemia on EKG and cardiac enzymes. -Sensory sets of cardiac enzymes, I think this is likely secondary to acid reflux, GI cocktail ordered.  Ulcerative colitis -On maintenance dose of Asacol and Canasa suppositories, denies any recent diarrhea.  Code Status: Full Family Communication: Plan explained to the patient and to her son at bedside. Disposition Plan:  Home when she is stable medically   Brief narrative: 71 year old African American female with past medical history include ulcerative colitis, hypertension, diabetes mellitus type 2 on chronic diastolic CHF. Patient was visiting her mother in the hospital when she collapsed in the hospital parking lot, she was witnessed by Dr. Kathrynn Humble one of the ED physicians.  Consultants:   none  Procedures:   none  Antibiotics:   none  HPI/Subjective:  sitting at the side of the bed eating breakfast, denies any shortness of breath chest pain or dizziness. The nurse reported later to me that she mentioned some chest pain.  Objective: Filed Vitals:   11/26/11 1420 11/26/11 2110 11/27/11 0542 11/27/11 1017  BP: 127/79 135/80 127/87 142/91  Pulse: 63 61 66 67  Temp: 98.3 F (36.8 C) 98.2 F (36.8 C) 97.8 F (36.6 C)   TempSrc: Oral Oral Oral   Resp: _0 Height:      Weight:   96.5 kg (212 lb 11.9 oz)   SpO2: 97% 94% 97%     Intake/Output Summary (Last 24 hours) at 11/27/11 1133 Last data filed at 11/27/11 1125  Gross per 24 hour  Intake   1203 ml  Output   3010 ml  Net  -1807 ml    Exam:  General: Alert and awake, oriented x3, not in any acute distress. HEENT: anicteric sclera, pupils reactive to light and accommodation, EOMI CVS: S1-S2 clear, no murmur rubs or gallops Chest: clear to auscultation bilaterally, no wheezing, rales or rhonchi Abdomen: soft nontender, nondistended, normal bowel sounds, no organomegaly Extremities: no cyanosis, clubbing or edema noted bilaterally Neuro: Cranial nerves II-XII intact, no focal neurological deficits  Data Reviewed: Basic Metabolic Panel:  Lab 45/85/92 0434 11/26/11 0329 11/25/11 1600  NA 139 139 137  K 4.1 3.2* 3.2*  CL 103 101 97  CO2 _0 GLUCOSE 129* 174* 134*  BUN _1 CREATININE 0.43* 0.35* 0.38*  CALCIUM 9.0 9.5 9.6  MG -- 1.6 1.2*  PHOS -- -- 3.7   Liver Function Tests: No results found for this  basename: AST:5,ALT:5,ALKPHOS:5,BILITOT:5,PROT:5,ALBUMIN:5 in the last 168 hours No results found for this basename: LIPASE:5,AMYLASE:5 in the last 168 hours No results found for this basename: AMMONIA:5 in the last 168 hours CBC:  Lab 11/27/11 0434 11/25/11 1600  WBC 10.0 10.4  NEUTROABS -- 7.3  HGB 11.5* 12.0  HCT 35.3* 36.0  MCV 91.0 89.8  PLT 390 405*   Cardiac Enzymes:  Lab 11/26/11 0329 11/25/11 2155 11/25/11 1600  CKTOTAL 79 88 --  CKMB 1.7 1.8 --  CKMBINDEX -- -- --  TROPONINI <0.30 <0.30 <0.30   BNP (last 3 results)  Basename 11/25/11 1600 08/03/11 1305  PROBNP 68.4 42.0   CBG:  Lab 11/27/11 0711 11/26/11 2108 11/26/11 1647 11/26/11 1202 11/26/11 0749  GLUCAP 142* 112* 107* 131* 111*    No results found for this or any previous visit (from the past 240 hour(s)).   Studies: Dg Chest 2 View  11/25/2011  *RADIOLOGY REPORT*  Clinical Data: Shortness of breath.  CHEST - 2 VIEW  Comparison: 08/03/2011.  Findings: The heart is enlarged but stable.  The mediastinal and hilar contours are unchanged.  There is vascular congestion and probable mild interstitial pulmonary edema.  No pleural effusion or focal infiltrate.  The bony thorax is intact.  IMPRESSION: Cardiac enlargement with vascular congestion and probable mild interstitial pulmonary edema.  Original Report Authenticated By: P. Kalman Jewels, [name]   Ct Head Wo Contrast  11/25/2011  *RADIOLOGY REPORT*  Clinical Data: Loss of consciousness.  Fell.  CT HEAD WITHOUT CONTRAST  Technique:  Contiguous axial images were obtained from the base of the skull through the vertex without contrast.  Comparison: 06/25/2009.  Findings: The ventricles are normal and stable for age.  There is fairly significant periventricular white matter disease but this appears stable also.  No acute intracranial findings such as hemispheric infarction or intracranial hemorrhage.  No mass lesions.  The brainstem and cerebellum appear normal and  stable.  The bony structures are intact.  No skull fracture.  Stable tiny radiopaque foreign bodies in the scalp at the posterior apex.  IMPRESSION:  1.  No acute intracranial findings. 2.  Stable advanced periventricular white matter disease.  Original Report Authenticated By: P. Kalman Jewels, [name]    Scheduled Meds:    . atenolol  50 mg Oral Daily  . cholecalciferol  5,000 Units Oral Daily  . clopidogrel  75 mg Oral QAC breakfast  . enoxaparin (LOVENOX) injection  40 mg Subcutaneous Q24H  . furosemide  40 mg Oral BID  . glimepiride  1 mg Oral QAC breakfast  . insulin aspart  0-5 Units Subcutaneous QHS  . insulin aspart  0-9 Units Subcutaneous TID WC  . insulin aspart  3 Units Subcutaneous TID WC  . isosorbide mononitrate  60 mg Oral Daily  . losartan  50 mg Oral Daily  . magnesium chloride  1 tablet Oral BID  . Mesalamine  1,600 mg Oral TID  . nabumetone  750 mg Oral BID WC  . pantoprazole  40 mg Oral Q1200  . phenazopyridine  200 mg Oral Once  . potassium chloride  40 mEq Oral Daily  . potassium chloride  60 mEq  Oral Once  . sodium chloride  3 mL Intravenous Q12H  . sodium chloride  3 mL Intravenous Q12H  . DISCONTD: mesalamine  1,600 mg Oral TID   Continuous Infusions:   Principal Problem:  *Syncope and collapse Active Problems:  DIABETES MELLITUS  ANXIETY  DEPRESSION  GERD  FIBROMYALGIA  Hypertension  CAD (coronary artery disease)  Nonischemic cardiomyopathy  Ulcerative colitis  Chronic diastolic CHF (congestive heart failure)  Lower extremity edema  Hypokalemia  Hypomagnesemia    Time spent: 40 minutes    [facility] Pager [phone]. If 8PM-8AM, please contact night-coverage at [email or web address], password [facility] 11/27/2011, 11:33 AM  LOS: 2 days

## 2011-11-27 NOTE — Progress Notes (Signed)
NP K. Schorr called back with orders to obtain UA and administer Pyridium 200 mg once.  Will continue to monitor pt.

## 2011-11-27 NOTE — Progress Notes (Signed)
Pt complaining of pain upon urination that started a few hours ago.  Pt states that the pain radiates from her urethra to her abdomen.  Also complains of right flank pain that just started early this morning as well.  Pt states that she is going to the bathroom more frequently and is having urinary urgency.  Per pt and nurse tech, urine output this morning was tea-colored and slightly bloody with no clots.  Notified Lamar Blinks, NP on call.  No new orders received at this time. Will continue to monitor pt.

## 2011-11-27 NOTE — Progress Notes (Addendum)
Pt had an episode of nausea and vomited. Pt was given Phenergan po because she still reported feeling nauseous. Pt unsure if this was caused from pain medication. Pt resting. Will continue to monitor pt.   Pt had another episode of nausea and vomiting after PO Phenergan was given. MD notified. Per Dr. Hartford Poli, orders for IV Zofran and abdominal x-ray were entered. Pt was given Zofran IV. Will continue to monitor pt.

## 2011-11-28 DIAGNOSIS — R319 Hematuria, unspecified: Secondary | ICD-10-CM | POA: Diagnosis not present

## 2011-11-28 DIAGNOSIS — J45909 Unspecified asthma, uncomplicated: Secondary | ICD-10-CM | POA: Diagnosis not present

## 2011-11-28 DIAGNOSIS — I517 Cardiomegaly: Secondary | ICD-10-CM | POA: Diagnosis not present

## 2011-11-28 DIAGNOSIS — R3129 Other microscopic hematuria: Secondary | ICD-10-CM

## 2011-11-28 DIAGNOSIS — I2510 Atherosclerotic heart disease of native coronary artery without angina pectoris: Secondary | ICD-10-CM

## 2011-11-28 DIAGNOSIS — R55 Syncope and collapse: Secondary | ICD-10-CM | POA: Diagnosis not present

## 2011-11-28 LAB — CBC
Hemoglobin: 12.1 g/dL (ref 12.0–15.0)
MCH: 30.4 pg (ref 26.0–34.0)
MCV: 91.7 fL (ref 78.0–100.0)
RBC: 3.98 MIL/uL (ref 3.87–5.11)

## 2011-11-28 LAB — BASIC METABOLIC PANEL
CO2: 27 mEq/L (ref 19–32)
Chloride: 100 mEq/L (ref 96–112)
GFR calc non Af Amer: >90 mL/min (ref >90)
Glucose, Bld: 122 mg/dL — ABNORMAL HIGH (ref 70–99)
Potassium: 3.8 mEq/L (ref 3.5–5.1)
Sodium: 137 mEq/L (ref 135–145)

## 2011-11-28 LAB — GLUCOSE, CAPILLARY: Glucose-Capillary: 167 mg/dL — ABNORMAL HIGH (ref 70–99)

## 2011-11-28 MED ORDER — CEFPODOXIME PROXETIL 200 MG PO TABS: 200.0000 mg | ORAL_TABLET | Freq: Two times a day (BID) | ORAL | Status: DC

## 2011-11-28 NOTE — Discharge Summary (Signed)
[place], is a 71 y.o. female  DOB [birth year]  MRN [id number].  Admission date:  11/25/2011  Discharge Date:  11/28/2011  Primary MD  [name] Space, MD  Admitting Physician  [name] June Leap, MD  Admission Diagnosis  Hypokalemia [276.8] Hypomagnesemia [275.2] Syncope [780.2] Lower extremity edema [782.3] fall  Discharge Diagnosis     Principal Problem:  *Syncope and collapse Active Problems:  DIABETES MELLITUS  ANXIETY  DEPRESSION  GERD  FIBROMYALGIA  Hypertension  CAD (coronary artery disease)  Ulcerative colitis  Chronic diastolic CHF (congestive heart failure)  Lower extremity edema  Hypokalemia  Hypomagnesemia  Hematuria    Past Medical History  Diagnosis Date  . CHF (congestive heart failure)     Diastolic  . Volume overload   . Bronchiectasis   . Hypertension   . CAD (coronary artery disease)     BMS to the LAD, 2002 / catheterization 2009 LAD stents patent with otherwise mild disease; Managed medically.S/P myoview December 2012 felt to probably be normal.   . Diabetes mellitus   . Obesity   . GERD (gastroesophageal reflux disease)   . Fibromyalgia   . Anxiety   . Allergy, unspecified not elsewhere classified   . Pulmonary nodule     Negative PET in 2010  . Other diseases of lung, not elsewhere classified   . Hypercholesterolemia   . Diverticulosis of colon   . Ulcerative colitis, left sided 2008    Hx of  . Adenomatous colon polyp 02/2006  . Microscopic hematuria   . Neoplasm of kidney   . Stress incontinence, female   . DJD (degenerative joint disease)   . Vitamin D deficiency disease   . Anemia   . Asthma     Past Surgical History  Procedure Date  . Cholecystectomy   . Hand surgery 01/2006    Right Hand Surgery by Dr. Amedeo Plenty  . Coronary stent placement 2002    BMS to the LAD  . Abdominal hysterectomy       Recommendations for primary care physician for things to follow:   Please follow urine culture results, patient should follow with her urologist within a week for findings of renal cyst on ultrasound and hematuria this admission.   Discharge Diagnoses:   Principal Problem:  *Syncope and collapse Active Problems:  DIABETES MELLITUS  ANXIETY  DEPRESSION  GERD  FIBROMYALGIA  Hypertension  CAD (coronary artery disease)  Ulcerative colitis  Chronic diastolic CHF (congestive heart failure)  Lower extremity edema  Hypokalemia  Hypomagnesemia  Hematuria    Discharge Condition: Stable   Diet recommendation: See Discharge Instructions below   Consults PT   History of present illness and  Hospital Course:  See H&P, Labs, Consult and Test reports for all details in brief, patient was admitted for a couple episode which happened in the ER while she was visiting one of her family members, he even was most likely secondary to vasovagal episode. She also had a mild UTI for which she  has been started on empiric antibiotic will request primary care physician to kindly check urine culture results which should be back in the next 24-48 hours.   Patient was stable on telemetry, ruled out MI with serial negative cardiac enzymes, had a stable echogram and carotid duplex, was not orthostatic. He is now able tablet without any discomfort or symptoms, seen by PT and cleared for home discharge without any home health needs.   Patient does have history of renal cyst for which he follows with the urologist [name], she did have an episode of hematuria yesterday which has now since resolved, he had a renal ultrasound which confirmed the cyst, currently has no hematuria or pain. I have requested her to follow with [name] within a week for this problem.    She has chronic diastolic CHF which was confirmed by echo gram, will defer to outpatient physician and her cardiologist [name] to  adjust her outpatient medications i.e. Norvasc Lasix etc. in [name] has no edema no signs of decompensation.   Her type 2 diabetes mellitus is stable we'll resume her home regimen of medications.  CBG (last 3)   Basename 11/28/11 1208 11/28/11 0740 11/27/11 2148  GLUCAP 167* 121* 125*      [name]   Subjective:   [name] has no headache,no chest abdominal pain,no new weakness tingling or numbness, feels much better wants to go home [name]. Her hematuria has resolved. She walked to the bathroom multiple times without any dizziness.  Objective:   Blood pressure 120/75, pulse 62, temperature 97.8 F (36.6 C), temperature source Oral, resp. rate 18, height _0  (1.575 m), weight 96.117 kg (211 lb 14.4 oz), SpO2 100.00%.   Intake/Output Summary (Last 24 hours) at 11/28/11 1324 Last data filed at 11/28/11 0245  Gross per 24 hour  Intake      0 ml  Output    900 ml  Net   -900 ml    Exam Awake Alert, Oriented *3, No new F.N deficits, Normal affect [place].AT,PERRAL Supple Neck,No JVD, No cervical lymphadenopathy appriciated.  Symmetrical Chest wall movement, Good air movement bilaterally, CTAB RRR,No Gallops,Rubs or new Murmurs, No Parasternal Heave +ve B.Sounds, Abd Soft, Non tender, No organomegaly appriciated, No rebound -guarding or rigidity. No Cyanosis, Clubbing or edema, No new Rash or bruise  Data Review   Major procedures and Radiology Reports - PLEASE review detailed and final reports for all details in brief -   Echo  Left ventricle: The cavity size was normal. Wall thickness was increased in a pattern of mild LVH. Systolic function was normal. The estimated ejection fraction was in the range of 55% to 60%. Wall motion was normal; there were no regional wall motion abnormalities.    Carotids  Preliminary report: Bilateral: No evidence of hemodynamically significant internal carotid artery stenosis. Vertebral artery flow is antegrade.    Dg Chest 2  View  11/25/2011  *RADIOLOGY REPORT*  Clinical Data: Shortness of breath.  CHEST - 2 VIEW  Comparison: 08/03/2011.  Findings: The heart is enlarged but stable.  The mediastinal and hilar contours are unchanged.  There is vascular congestion and probable mild interstitial pulmonary edema.  No pleural effusion or focal infiltrate.  The bony thorax is intact.  IMPRESSION: Cardiac enlargement with vascular congestion and probable mild interstitial pulmonary edema.  Original Report Authenticated By: P. Kalman Jewels, [name]   Ct Head Wo Contrast  11/25/2011  *RADIOLOGY REPORT*  Clinical Data: Loss of consciousness.  Fell.  CT HEAD WITHOUT CONTRAST  Technique:  Contiguous axial images were obtained from the base of the skull through the vertex without contrast.  Comparison: 06/25/2009.  Findings: The ventricles are normal and stable for age.  There is fairly significant periventricular white matter disease but this appears stable also.  No acute intracranial findings such as hemispheric infarction or intracranial hemorrhage.  No mass lesions.  The brainstem and cerebellum appear normal and stable.  The bony structures are intact.  No skull fracture.  Stable tiny radiopaque foreign bodies in the scalp at the posterior apex.  IMPRESSION:  1.  No acute intracranial findings. 2.  Stable advanced periventricular white matter disease.  Original Report Authenticated By: P. Kalman Jewels, [name]   US Renal  11/27/2011  *RADIOLOGY REPORT*  Clinical Data: Hematuria.  History of diabetes, CHF, hypertension.  RENAL/URINARY TRACT ULTRASOUND COMPLETE  Comparison:  07/12/2008  Findings:  Right Kidney:  12.4 cm in length.  Normal appearance.  Left Kidney:  13.5 cm in length.  Isoechoic nodule is identified in the upper pole region which measures 2.0 x 1.4 x 1.8 cm. Cyst appears stable since previous CT exam.  No hydronephrosis.  Bladder:  The bladder is not visualized.  IMPRESSION: 1.  Left renal cyst. 2.  No evidence for  hydronephrosis.  Original Report Authenticated By: Glenice Bow, [name]   Dg Abd 2 Views  11/27/2011  *RADIOLOGY REPORT*  Clinical Data: Nausea, vomiting and abdominal pain.  ABDOMEN - 2 VIEW  Comparison: No priors.  Findings: Study is limited by patient's large body habitus.  Gas and stool is seen scattered throughout the colon extending to the rectum. There are a few nondilated loops of gas-filled small bowel in the central abdomen.  The liver is enlarged measuring 24.6 cm in craniocaudal span.  No gross evidence of pneumoperitoneum is identified.  Numerous calcific densities project lateral to the right iliac wing, possibly injection granulomas (nonspecific on this plain film examination).  IMPRESSION: 1.  Nonspecific, nonobstructive bowel gas pattern. 2.  No pneumoperitoneum. 3.  Hepatomegaly.  Original Report Authenticated By: Etheleen Mayhew, [name]    Micro Results     CBC w Diff: Lab Results  Component Value Date   WBC 10.9* 11/28/2011   HGB 12.1 11/28/2011   HCT 36.5 11/28/2011   PLT 431* 11/28/2011   LYMPHOPCT 20 11/25/2011   MONOPCT 7 11/25/2011   EOSPCT 3 11/25/2011   BASOPCT 0 11/25/2011    CMP: Lab Results  Component Value Date   NA 137 11/28/2011   K 3.8 11/28/2011   CL 100 11/28/2011   CO2 27 11/28/2011   BUN 10 11/28/2011   CREATININE 0.40* 11/28/2011   PROT 8.1 08/03/2011   ALBUMIN 4.4 08/03/2011   BILITOT 0.6 08/03/2011   ALKPHOS 61 08/03/2011   AST 16 08/03/2011   ALT 5 08/03/2011  .   Discharge Instructions     Follow with Primary MD NADEL,SCOTT M, MD in 3 days   Get CBC, CMP, UA, checked 3 days by Primary MD and again as instructed by your Primary MD.  Follow your Urine Culture results which are pending.  Get Medicines reviewed and adjusted.  Please request your Prim.MD to go over all Hospital Tests and Procedure/Radiological results at the follow up, please get all Hospital records sent to your Prim MD by signing hospital release before you go home.  Activity: As  tolerated with Full fall precautions use walker/cane & assistance as needed  Diet:  Heart healthy, Low carb.  For Heart failure patients - Check your Weight same time everyday, if you gain over 2 pounds, or you develop in leg swelling, experience more shortness of breath or chest pain, call your Primary MD immediately. Follow Cardiac Low Salt Diet and 1.8 lit/day fluid restriction.  Disposition Home   If you experience worsening of your admission symptoms, develop shortness of breath, life threatening emergency, suicidal or homicidal thoughts you must seek medical attention immediately by calling 911 or calling your MD immediately  if symptoms less severe.  You Must read complete instructions/literature along with all the possible adverse reactions/side effects for all the Medicines you take and that have been prescribed to you. Take any new Medicines after you have completely understood and accpet all the possible adverse reactions/side effects.   Do not drive if your were admitted for syncope or siezures until you have seen by Primary MD or a Neurologist and advised to drive.  Do not drive when taking Pain medications.    Do not take more than prescribed Pain, Sleep and Anxiety Medications  Special Instructions: If you have smoked or chewed Tobacco  in the last 2 yrs please stop smoking, stop any regular Alcohol  and or any Recreational drug use.  Wear Seat belts while driving.  Follow-up Information    Follow up with NADEL,SCOTT M, MD. Schedule an appointment as soon as possible for a visit in 3 days.   Contact information:   [facility] [place] [address] [place] [place]       Follow up with Malka So, MD. Schedule an appointment as soon as possible for a visit in 5 days.   Contact information:   [address] [place] [phone]       Follow up with Dola Argyle, MD. Schedule an  appointment as soon as possible for a visit in 1 week.   Contact information:   [address] [place] [phone]            Discharge Medications   Medication List  As of 11/28/2011  1:24 PM   START taking these medications         cefpodoxime 200 MG tablet   Commonly known as: VANTIN   Take 1 tablet (200 mg total) by mouth 2 (two) times daily.         CHANGE how you take these medications         losartan 50 MG tablet   Commonly known as: COZAAR   TAKE 1 TABLET EVERY DAY FOR BLOOD PRESSURE   What changed: See the new instructions.         CONTINUE taking these medications         albuterol (2.5 MG/3ML) 0.083% nebulizer solution   Commonly known as: PROVENTIL      ALPRAZolam 0.5 MG tablet   Commonly known as: XANAX      amLODipine 10 MG tablet   Commonly known as: NORVASC      * amoxicillin-clavulanate 875-125 MG per tablet   Commonly known as: AUGMENTIN      * amoxicillin-clavulanate 875-125 MG per tablet   Commonly known as: AUGMENTIN   Take 1 tablet by mouth 2 (two) times daily.      * amoxicillin-clavulanate 875-125 MG per tablet   Commonly known as: AUGMENTIN   Take 1 tablet by mouth 2 (two)  times daily.      ANALPRAM-HC 2.5-1 % rectal cream   Generic drug: hydrocortisone-pramoxine      atenolol 50 MG tablet   Commonly known as: TENORMIN      clopidogrel 75 MG tablet   Commonly known as: PLAVIX      furosemide 40 MG tablet   Commonly known as: LASIX      glimepiride 1 MG tablet   Commonly known as: AMARYL      guaiFENesin 600 MG 12 hr tablet   Commonly known as: MUCINEX      hydrocodone-acetaminophen 5-500 MG per capsule   Commonly known as: LORCET-HD      isosorbide mononitrate 60 MG 24 hr tablet   Commonly known as: IMDUR      * mesalamine 1000 MG suppository   Commonly known as: CANASA      * mesalamine 400 MG EC tablet   Commonly known as: ASACOL      nabumetone 750 MG tablet    Commonly known as: RELAFEN   Take 1 tablet (750 mg total) by mouth 2 (two) times daily with a meal.      NAFTIN 2 % Crea   Generic drug: Naftifine HCl      NITROSTAT 0.4 MG SL tablet   Generic drug: nitroGLYCERIN   1 TABLET UNDER TONGUE AT ONSET OF CHEST PAIN YOU MAY REPEAT EVERY 5 MINUTES FOR UP TO 3 DOSES.      potassium chloride SA 20 MEQ tablet   Commonly known as: K-DUR,KLOR-CON      PRILOSEC 40 MG capsule   Generic drug: omeprazole      promethazine 25 MG tablet   Commonly known as: PHENERGAN      Vitamin D-3 5000 UNITS Tabs     * Notice: This list has 5 medication(s) that are the same as other medications prescribed for you. Read the directions carefully, and ask your doctor or other care provider to review them with you.        Where to get your medications    These are the prescriptions that you need to pick up. We sent them to a specific pharmacy, so you will need to go there to get them.   [id number] [place] [address] [id number]   Phone: [phone]       cefpodoxime 200 MG tablet   losartan 50 MG tablet             [place], [place] Home Medication Instructions HTTS:[id number]  Printed on:11/28/11 1325  Medication Information                    hydrocortisone-pramoxine (ANALPRAM-HC) 2.5-1 % rectal cream Place rectally as needed. APPLY A SMALL AMOUNT TO AFFECTED AREA.           guaiFENesin (MUCINEX) 600 MG 12 hr tablet 600 mg. AS NEEDED           omeprazole (PRILOSEC) 40 MG capsule Take 40 mg by mouth daily.             Cholecalciferol (VITAMIN D-3) 5000 UNITS TABS Take 5,000 Units by mouth daily.             NITROSTAT 0.4 MG SL tablet 1 TABLET UNDER TONGUE AT ONSET OF CHEST PAIN YOU MAY REPEAT EVERY 5 MINUTES FOR UP TO 3 DOSES.  mesalamine (CANASA) 1000 MG suppository Place 1,000 mg rectally as needed.             amoxicillin-clavulanate (AUGMENTIN) 875-125 MG per  tablet Take 1 tablet by mouth as needed.             potassium chloride SA (K-DUR,KLOR-CON) 20 MEQ tablet Take 20 mEq by mouth daily.             amoxicillin-clavulanate (AUGMENTIN) 875-125 MG per tablet Take 1 tablet by mouth 2 (two) times daily.           amoxicillin-clavulanate (AUGMENTIN) 875-125 MG per tablet Take 1 tablet by mouth 2 (two) times daily.           Naftifine HCl (NAFTIN) 2 % CREA Apply topically 2 (two) times daily.           nabumetone (RELAFEN) 750 MG tablet Take 1 tablet (750 mg total) by mouth 2 (two) times daily with a meal.           atenolol (TENORMIN) 50 MG tablet Take 50 mg by mouth daily.           amLODipine (NORVASC) 10 MG tablet Take 10 mg by mouth daily.           clopidogrel (PLAVIX) 75 MG tablet Take 75 mg by mouth daily.           furosemide (LASIX) 40 MG tablet Take 40 mg by mouth 3 (three) times daily.           mesalamine (ASACOL) 400 MG EC tablet Take 1,600 mg by mouth 3 (three) times daily.           promethazine (PHENERGAN) 25 MG tablet Take 25 mg by mouth 2 (two) times daily as needed. For nausea           isosorbide mononitrate (IMDUR) 60 MG 24 hr tablet Take 60 mg by mouth daily.           hydrocodone-acetaminophen (LORCET-HD) 5-500 MG per capsule Take 1 capsule by mouth 2 (two) times daily as needed.           ALPRAZolam (XANAX) 0.5 MG tablet Take 0.5 mg by mouth 3 (three) times daily as needed. anxiety           glimepiride (AMARYL) 1 MG tablet Take 1 mg by mouth daily before breakfast.           albuterol (PROVENTIL) (2.5 MG/3ML) 0.083% nebulizer solution Take 2.5 mg by nebulization 3 (three) times daily as needed.           losartan (COZAAR) 50 MG tablet TAKE 1 TABLET EVERY DAY FOR BLOOD PRESSURE           cefpodoxime (VANTIN) 200 MG tablet Take 1 tablet (200 mg total) by mouth 2 (two) times daily.              Total Time in preparing paper work, data evaluation and todays exam - 35 minutes  Thurnell Lose M.D on 11/28/2011 at 1:24 PM  [place]  [phone]

## 2011-11-28 NOTE — Progress Notes (Signed)
Per Physical Therapy notes, patient does not need any HHC services at this time. Will continue to follow for DCP; B Pennie Rushing

## 2011-11-28 NOTE — Progress Notes (Signed)
Case management consulted and patient ready for discharge.

## 2011-11-28 NOTE — Progress Notes (Signed)
VASCULAR LAB PRELIMINARY  PRELIMINARY  PRELIMINARY  PRELIMINARY  Carotid duplex completed.    Preliminary report:  Bilateral:  No evidence of hemodynamically significant internal carotid artery stenosis.   Vertebral artery flow is antegrade.     Gaylia Kassel, RVS 11/28/2011, 12:32 PM

## 2011-11-28 NOTE — Progress Notes (Signed)
  Echocardiogram 2D Echocardiogram has been performed.  [name] 11/28/2011, 12:33 PM

## 2011-11-28 NOTE — Progress Notes (Signed)
Physical Therapy Treatment Patient Details Name: [name] MRN: [id number] DOB: [birth year] Today's Date: 11/28/2011 Time: 1655-3748 PT Time Calculation (min): 23 min  PT Assessment / Plan / Recommendation Comments on Treatment Session  Pt tolerated ambulation well using cane. requires only supervision. Hr 69 to 86.    Follow Up Recommendations  Other (comment) (most likely will not require HHPT as pt is up ad lib in room)    Barriers to Discharge        Equipment Recommendations  None recommended by PT    Recommendations for Other Services    Frequency Min 3X/week   Plan Discharge plan remains appropriate;Frequency remains appropriate    Precautions / Restrictions Precautions Precautions: Fall Restrictions Weight Bearing Restrictions: No   Pertinent Vitals/Pain HR 69-86 w/ mobility.    Mobility  Bed Mobility Supine to Sit: 7: Independent Transfers Sit to Stand: 7: Independent Stand to Sit: 7: Independent Ambulation/Gait Ambulation/Gait Assistance: 5: Supervision Ambulation Distance (Feet): 400 Feet Assistive device: Straight cane Ambulation/Gait Assistance Details: No lloss of balance today. Mild dyspnea after ambulation.  Pt trolerated well.  Gait Pattern: Within Functional Limits Gait velocity: WFL Pt reports up to BR w/ no assitsance    Exercises     PT Diagnosis:    PT Problem List:   PT Treatment Interventions:     PT Goals Acute Rehab PT Goals Pt will Ambulate: >150 feet;with modified independence PT Goal: Ambulate - Progress: Progressing toward goal  Visit Information  Last PT Received On: 11/28/11 Assistance Needed: +1    Subjective Data  Subjective: I am doing better.   Cognition  Overall Cognitive Status: Appears within functional limits for tasks assessed/performed    Balance     End of Session     GP     Claretha Cooper 11/28/2011, 10:56 AM

## 2011-11-29 ENCOUNTER — Telehealth: Payer: Self-pay | Admitting: Pulmonary Disease

## 2011-11-29 ENCOUNTER — Other Ambulatory Visit: Payer: Self-pay | Admitting: Nurse Practitioner

## 2011-11-29 ENCOUNTER — Other Ambulatory Visit: Payer: Self-pay | Admitting: Gastroenterology

## 2011-11-29 HISTORY — PX: CARDIAC CATHETERIZATION: SHX172

## 2011-11-29 LAB — URINE CULTURE

## 2011-11-29 MED ORDER — CLOPIDOGREL BISULFATE 75 MG PO TABS: 75.0000 mg | ORAL_TABLET | Freq: Every day | ORAL | Status: DC

## 2011-11-29 MED ORDER — ATENOLOL 50 MG PO TABS: 50.0000 mg | ORAL_TABLET | Freq: Every day | ORAL | Status: DC

## 2011-11-29 MED ORDER — AMLODIPINE BESYLATE 10 MG PO TABS: 10.0000 mg | ORAL_TABLET | Freq: Every day | ORAL | Status: DC

## 2011-11-29 MED ORDER — NABUMETONE 750 MG PO TABS: 750.0000 mg | ORAL_TABLET | Freq: Two times a day (BID) | ORAL | Status: DC

## 2011-11-29 MED ORDER — OMEPRAZOLE 40 MG PO CPDR: 40.0000 mg | DELAYED_RELEASE_CAPSULE | Freq: Every day | ORAL | Status: DC

## 2011-11-29 MED ORDER — POTASSIUM CHLORIDE CRYS ER 20 MEQ PO TBCR: 20.0000 meq | EXTENDED_RELEASE_TABLET | Freq: Every day | ORAL | Status: DC

## 2011-11-29 NOTE — Telephone Encounter (Signed)
NEED OFFICE VISIT FOR ANY FURTHER REFILLS!!

## 2011-11-29 NOTE — Telephone Encounter (Signed)
I spoke with the pt and she was requesting some refill son meds and stated that her discharge instruct was to f/u in 3 days with SN. Pt states she is doing ok and wants to hold off on f/u. She has appt in October with SN. Pt states she will call if anything needed before then. I advised if this is what she wants then this is fine. Pt states understanding. [place] Bing, CMA

## 2011-11-29 NOTE — Telephone Encounter (Signed)
I called and spoke with the pt and she gave me what meds needed to be refilled. Refill sent. Pt is aware.[place] Bing, CMA

## 2011-11-30 DIAGNOSIS — N952 Postmenopausal atrophic vaginitis: Secondary | ICD-10-CM | POA: Diagnosis not present

## 2011-11-30 DIAGNOSIS — D4959 Neoplasm of unspecified behavior of other genitourinary organ: Secondary | ICD-10-CM | POA: Diagnosis not present

## 2011-11-30 DIAGNOSIS — N281 Cyst of kidney, acquired: Secondary | ICD-10-CM | POA: Diagnosis not present

## 2011-11-30 DIAGNOSIS — N39498 Other specified urinary incontinence: Secondary | ICD-10-CM | POA: Diagnosis not present

## 2011-12-03 ENCOUNTER — Other Ambulatory Visit: Payer: Self-pay | Admitting: Nurse Practitioner

## 2011-12-03 MED ORDER — ISOSORBIDE MONONITRATE ER 60 MG PO TB24: 60.0000 mg | ORAL_TABLET | Freq: Every day | ORAL | Status: DC

## 2011-12-04 ENCOUNTER — Other Ambulatory Visit: Payer: Self-pay | Admitting: *Deleted

## 2011-12-04 MED ORDER — ATORVASTATIN CALCIUM 10 MG PO TABS: 10.0000 mg | ORAL_TABLET | Freq: Every day | ORAL | Status: DC

## 2011-12-05 DIAGNOSIS — R339 Retention of urine, unspecified: Secondary | ICD-10-CM | POA: Diagnosis not present

## 2011-12-05 DIAGNOSIS — R310 Gross hematuria: Secondary | ICD-10-CM | POA: Diagnosis not present

## 2011-12-05 DIAGNOSIS — N390 Urinary tract infection, site not specified: Secondary | ICD-10-CM | POA: Diagnosis not present

## 2011-12-07 ENCOUNTER — Emergency Department (HOSPITAL_COMMUNITY): Payer: Medicare Other

## 2011-12-07 ENCOUNTER — Encounter (HOSPITAL_COMMUNITY)
Admission: EM | Disposition: A | Discharge status: Home / Self Care | Payer: Self-pay | Source: Home / Self Care | Attending: Cardiology

## 2011-12-07 ENCOUNTER — Encounter (HOSPITAL_COMMUNITY): Payer: Self-pay | Admitting: Adult Health

## 2011-12-07 ENCOUNTER — Inpatient Hospital Stay (HOSPITAL_COMMUNITY)
Admission: EM | Admit: 2011-12-07 | Discharge: 2011-12-08 | DRG: 287 | Disposition: A | Discharge status: Home / Self Care | Payer: Medicare Other | Attending: Cardiology | Admitting: Cardiology

## 2011-12-07 DIAGNOSIS — I509 Heart failure, unspecified: Secondary | ICD-10-CM | POA: Diagnosis present

## 2011-12-07 DIAGNOSIS — I25119 Atherosclerotic heart disease of native coronary artery with unspecified angina pectoris: Secondary | ICD-10-CM | POA: Diagnosis present

## 2011-12-07 DIAGNOSIS — IMO0001 Reserved for inherently not codable concepts without codable children: Secondary | ICD-10-CM | POA: Diagnosis present

## 2011-12-07 DIAGNOSIS — R911 Solitary pulmonary nodule: Secondary | ICD-10-CM | POA: Diagnosis present

## 2011-12-07 DIAGNOSIS — E785 Hyperlipidemia, unspecified: Secondary | ICD-10-CM | POA: Diagnosis present

## 2011-12-07 DIAGNOSIS — E669 Obesity, unspecified: Secondary | ICD-10-CM | POA: Diagnosis present

## 2011-12-07 DIAGNOSIS — I2510 Atherosclerotic heart disease of native coronary artery without angina pectoris: Secondary | ICD-10-CM | POA: Diagnosis not present

## 2011-12-07 DIAGNOSIS — E7800 Pure hypercholesterolemia, unspecified: Secondary | ICD-10-CM

## 2011-12-07 DIAGNOSIS — J479 Bronchiectasis, uncomplicated: Secondary | ICD-10-CM | POA: Diagnosis present

## 2011-12-07 DIAGNOSIS — D649 Anemia, unspecified: Secondary | ICD-10-CM | POA: Diagnosis present

## 2011-12-07 DIAGNOSIS — J45909 Unspecified asthma, uncomplicated: Secondary | ICD-10-CM | POA: Diagnosis present

## 2011-12-07 DIAGNOSIS — F3289 Other specified depressive episodes: Secondary | ICD-10-CM | POA: Diagnosis present

## 2011-12-07 DIAGNOSIS — E119 Type 2 diabetes mellitus without complications: Secondary | ICD-10-CM | POA: Diagnosis not present

## 2011-12-07 DIAGNOSIS — R079 Chest pain, unspecified: Secondary | ICD-10-CM

## 2011-12-07 DIAGNOSIS — K219 Gastro-esophageal reflux disease without esophagitis: Secondary | ICD-10-CM | POA: Diagnosis present

## 2011-12-07 DIAGNOSIS — Z87891 Personal history of nicotine dependence: Secondary | ICD-10-CM

## 2011-12-07 DIAGNOSIS — F411 Generalized anxiety disorder: Secondary | ICD-10-CM | POA: Diagnosis present

## 2011-12-07 DIAGNOSIS — I5032 Chronic diastolic (congestive) heart failure: Secondary | ICD-10-CM | POA: Diagnosis present

## 2011-12-07 DIAGNOSIS — K5190 Ulcerative colitis, unspecified, without complications: Secondary | ICD-10-CM | POA: Diagnosis present

## 2011-12-07 DIAGNOSIS — R0989 Other specified symptoms and signs involving the circulatory and respiratory systems: Secondary | ICD-10-CM | POA: Diagnosis not present

## 2011-12-07 DIAGNOSIS — C649 Malignant neoplasm of unspecified kidney, except renal pelvis: Secondary | ICD-10-CM | POA: Diagnosis not present

## 2011-12-07 DIAGNOSIS — Z9861 Coronary angioplasty status: Secondary | ICD-10-CM | POA: Diagnosis not present

## 2011-12-07 DIAGNOSIS — I428 Other cardiomyopathies: Secondary | ICD-10-CM | POA: Diagnosis present

## 2011-12-07 DIAGNOSIS — E559 Vitamin D deficiency, unspecified: Secondary | ICD-10-CM | POA: Diagnosis present

## 2011-12-07 DIAGNOSIS — F329 Major depressive disorder, single episode, unspecified: Secondary | ICD-10-CM | POA: Diagnosis present

## 2011-12-07 DIAGNOSIS — I10 Essential (primary) hypertension: Secondary | ICD-10-CM | POA: Diagnosis present

## 2011-12-07 DIAGNOSIS — R918 Other nonspecific abnormal finding of lung field: Secondary | ICD-10-CM | POA: Diagnosis not present

## 2011-12-07 DIAGNOSIS — M1990 Unspecified osteoarthritis, unspecified site: Secondary | ICD-10-CM | POA: Diagnosis present

## 2011-12-07 DIAGNOSIS — R0789 Other chest pain: Secondary | ICD-10-CM | POA: Diagnosis not present

## 2011-12-07 HISTORY — DX: Urinary tract infection, site not specified: N39.0

## 2011-12-07 HISTORY — DX: Syncope and collapse: R55

## 2011-12-07 HISTORY — PX: LEFT HEART CATHETERIZATION WITH CORONARY ANGIOGRAM: SHX5451

## 2011-12-07 LAB — POCT ACTIVATED CLOTTING TIME: Activated Clotting Time: 140 seconds

## 2011-12-07 LAB — BASIC METABOLIC PANEL
BUN: 14 mg/dL (ref 6–23)
BUN: 11 mg/dL (ref 6–23)
Chloride: 105 mEq/L (ref 96–112)
GFR calc Af Amer: >90 mL/min (ref >90)
GFR calc non Af Amer: >90 mL/min (ref >90)
GFR calc non Af Amer: >90 mL/min (ref >90)
Glucose, Bld: 127 mg/dL — ABNORMAL HIGH (ref 70–99)
Potassium: 3.7 mEq/L (ref 3.5–5.1)
Potassium: 3.8 mEq/L (ref 3.5–5.1)
Sodium: 142 mEq/L (ref 135–145)

## 2011-12-07 LAB — LIPASE, BLOOD: Lipase: 25 U/L (ref 11–59)

## 2011-12-07 LAB — PRO B NATRIURETIC PEPTIDE: Pro B Natriuretic peptide (BNP): 71.6 pg/mL (ref 0–125)

## 2011-12-07 LAB — CBC
HCT: 39 % (ref 36.0–46.0)
Hemoglobin: 12.9 g/dL (ref 12.0–15.0)
MCHC: 33.1 g/dL (ref 30.0–36.0)
MCHC: 33.4 g/dL (ref 30.0–36.0)
Platelets: 436 10*3/uL — ABNORMAL HIGH (ref 150–400)
RDW: 14.1 % (ref 11.5–15.5)
WBC: 11.6 10*3/uL — ABNORMAL HIGH (ref 4.0–10.5)

## 2011-12-07 LAB — GLUCOSE, CAPILLARY
Glucose-Capillary: 115 mg/dL — ABNORMAL HIGH (ref 70–99)
Glucose-Capillary: 102 mg/dL — ABNORMAL HIGH (ref 70–99)
Glucose-Capillary: 131 mg/dL — ABNORMAL HIGH (ref 70–99)

## 2011-12-07 LAB — HEPATIC FUNCTION PANEL
AST: 19 U/L (ref 0–37)
Bilirubin, Direct: <0.1 mg/dL (ref 0.0–0.3)

## 2011-12-07 LAB — LIPID PANEL
Cholesterol: 121 mg/dL (ref 0–200)
HDL: 49 mg/dL (ref >39)
Total CHOL/HDL Ratio: 2.5 RATIO

## 2011-12-07 LAB — PROTIME-INR
INR: 1.01 (ref 0.00–1.49)
Prothrombin Time: 13.5 seconds (ref 11.6–15.2)

## 2011-12-07 LAB — CARDIAC PANEL(CRET KIN+CKTOT+MB+TROPI)
CK, MB: 1.3 ng/mL (ref 0.3–4.0)
Relative Index: INVALID (ref 0.0–2.5)
Relative Index: INVALID (ref 0.0–2.5)
Total CK: 77 U/L (ref 7–177)
Troponin I: <0.3 ng/mL (ref <0.30)
Troponin I: <0.3 ng/mL (ref <0.30)
Troponin I: <0.3 ng/mL (ref <0.30)

## 2011-12-07 LAB — MRSA PCR SCREENING: MRSA by PCR: NEGATIVE

## 2011-12-07 LAB — HEPARIN LEVEL (UNFRACTIONATED): Heparin Unfractionated: <0.1 IU/mL — ABNORMAL LOW (ref 0.30–0.70)

## 2011-12-07 SURGERY — LEFT HEART CATHETERIZATION WITH CORONARY ANGIOGRAM
Anesthesia: LOCAL

## 2011-12-07 MED ORDER — PANTOPRAZOLE SODIUM 40 MG PO TBEC
40.0000 mg | DELAYED_RELEASE_TABLET | Freq: Every day | ORAL | Status: DC
  Administered 2011-12-08: 40 mg via ORAL
  Filled 2011-12-07: qty 1

## 2011-12-07 MED ORDER — ATENOLOL 50 MG PO TABS
50.0000 mg | ORAL_TABLET | Freq: Every day | ORAL | Status: DC
  Administered 2011-12-07 – 2011-12-08 (×2): 50 mg via ORAL
  Filled 2011-12-07 (×2): qty 1

## 2011-12-07 MED ORDER — SODIUM CHLORIDE 0.9 % IJ SOLN: 3.0000 mL | Freq: Two times a day (BID) | INTRAMUSCULAR | Status: DC

## 2011-12-07 MED ORDER — LIDOCAINE HCL (PF) 1 % IJ SOLN
INTRAMUSCULAR | Status: AC
  Filled 2011-12-07: qty 30

## 2011-12-07 MED ORDER — SODIUM CHLORIDE 0.9 % IV SOLN
INTRAVENOUS | Status: AC
  Administered 2011-12-07: 50 mL/h via INTRAVENOUS

## 2011-12-07 MED ORDER — ACETAMINOPHEN 325 MG PO TABS: 650.0000 mg | ORAL_TABLET | ORAL | Status: DC | PRN

## 2011-12-07 MED ORDER — ONDANSETRON HCL 4 MG/2ML IJ SOLN: 4.0000 mg | Freq: Four times a day (QID) | INTRAMUSCULAR | Status: DC | PRN

## 2011-12-07 MED ORDER — NITROGLYCERIN 0.2 MG/ML ON CALL CATH LAB
INTRAVENOUS | Status: AC
  Filled 2011-12-07: qty 1

## 2011-12-07 MED ORDER — ATORVASTATIN CALCIUM 10 MG PO TABS
10.0000 mg | ORAL_TABLET | Freq: Every day | ORAL | Status: DC
  Administered 2011-12-07 – 2011-12-08 (×2): 10 mg via ORAL
  Filled 2011-12-07 (×2): qty 1

## 2011-12-07 MED ORDER — VITAMIN D 1000 UNITS PO TABS
5000.0000 [IU] | ORAL_TABLET | Freq: Every day | ORAL | Status: DC
  Administered 2011-12-07 – 2011-12-08 (×2): 5000 [IU] via ORAL
  Filled 2011-12-07 (×2): qty 5

## 2011-12-07 MED ORDER — NITROGLYCERIN 0.4 MG SL SUBL: 0.4000 mg | SUBLINGUAL_TABLET | SUBLINGUAL | Status: DC | PRN

## 2011-12-07 MED ORDER — ONDANSETRON HCL 4 MG/2ML IJ SOLN
4.0000 mg | Freq: Once | INTRAMUSCULAR | Status: AC
  Administered 2011-12-07: 4 mg via INTRAVENOUS
  Filled 2011-12-07: qty 2

## 2011-12-07 MED ORDER — ISOSORBIDE MONONITRATE ER 60 MG PO TB24
60.0000 mg | ORAL_TABLET | Freq: Every day | ORAL | Status: DC
  Administered 2011-12-07 – 2011-12-08 (×2): 60 mg via ORAL
  Filled 2011-12-07 (×2): qty 1

## 2011-12-07 MED ORDER — ONDANSETRON HCL 4 MG/2ML IJ SOLN
4.0000 mg | Freq: Once | INTRAMUSCULAR | Status: AC
  Administered 2011-12-07: 4 mg via INTRAVENOUS

## 2011-12-07 MED ORDER — ACETAMINOPHEN 325 MG PO TABS: 650.0000 mg | ORAL_TABLET | Freq: Four times a day (QID) | ORAL | Status: DC | PRN

## 2011-12-07 MED ORDER — ONDANSETRON HCL 4 MG/2ML IJ SOLN
INTRAMUSCULAR | Status: AC
  Filled 2011-12-07: qty 2

## 2011-12-07 MED ORDER — INSULIN ASPART 100 UNIT/ML ~~LOC~~ SOLN
0.0000 [IU] | Freq: Three times a day (TID) | SUBCUTANEOUS | Status: DC
Start: 2011-12-07 — End: 2011-12-08

## 2011-12-07 MED ORDER — ALBUTEROL SULFATE (5 MG/ML) 0.5% IN NEBU: 2.5000 mg | INHALATION_SOLUTION | RESPIRATORY_TRACT | Status: DC | PRN

## 2011-12-07 MED ORDER — HYDROCODONE-ACETAMINOPHEN 5-325 MG PO TABS
1.0000 | ORAL_TABLET | Freq: Four times a day (QID) | ORAL | Status: DC | PRN
  Administered 2011-12-07: 1 via ORAL
  Filled 2011-12-07: qty 1

## 2011-12-07 MED ORDER — MESALAMINE 400 MG PO CPDR: 1200.0000 mg | DELAYED_RELEASE_CAPSULE | Freq: Three times a day (TID) | ORAL | Status: DC

## 2011-12-07 MED ORDER — ALPRAZOLAM 0.5 MG PO TABS
0.5000 mg | ORAL_TABLET | Freq: Three times a day (TID) | ORAL | Status: DC | PRN
  Administered 2011-12-07: 0.5 mg via ORAL
  Filled 2011-12-07: qty 1

## 2011-12-07 MED ORDER — MIDAZOLAM HCL 2 MG/2ML IJ SOLN
INTRAMUSCULAR | Status: AC
  Filled 2011-12-07: qty 2

## 2011-12-07 MED ORDER — HEPARIN BOLUS VIA INFUSION
2000.0000 [IU] | Freq: Once | INTRAVENOUS | Status: AC
  Administered 2011-12-07: 2000 [IU] via INTRAVENOUS
  Filled 2011-12-07: qty 2000

## 2011-12-07 MED ORDER — SODIUM CHLORIDE 0.9 % IJ SOLN: 3.0000 mL | INTRAMUSCULAR | Status: DC | PRN

## 2011-12-07 MED ORDER — HEPARIN BOLUS VIA INFUSION
4000.0000 [IU] | Freq: Once | INTRAVENOUS | Status: AC
  Administered 2011-12-07: 4000 [IU] via INTRAVENOUS
  Filled 2011-12-07: qty 4000

## 2011-12-07 MED ORDER — AMLODIPINE BESYLATE 10 MG PO TABS
10.0000 mg | ORAL_TABLET | Freq: Every day | ORAL | Status: DC
  Administered 2011-12-07 – 2011-12-08 (×2): 10 mg via ORAL
  Filled 2011-12-07 (×2): qty 1

## 2011-12-07 MED ORDER — NABUMETONE 750 MG PO TABS
750.0000 mg | ORAL_TABLET | Freq: Two times a day (BID) | ORAL | Status: DC
  Administered 2011-12-07 – 2011-12-08 (×3): 750 mg via ORAL
  Filled 2011-12-07 (×6): qty 1

## 2011-12-07 MED ORDER — HEPARIN (PORCINE) IN NACL 100-0.45 UNIT/ML-% IJ SOLN
1200.0000 [IU]/h | INTRAMUSCULAR | Status: DC
  Administered 2011-12-07: 900 [IU]/h via INTRAVENOUS
  Administered 2011-12-07: 1200 [IU]/h via INTRAVENOUS
  Filled 2011-12-07 (×3): qty 250

## 2011-12-07 MED ORDER — HEPARIN (PORCINE) IN NACL 2-0.9 UNIT/ML-% IJ SOLN
INTRAMUSCULAR | Status: AC
  Filled 2011-12-07: qty 2000

## 2011-12-07 MED ORDER — MESALAMINE 400 MG PO TBEC
1600.0000 mg | DELAYED_RELEASE_TABLET | Freq: Three times a day (TID) | ORAL | Status: DC
  Administered 2011-12-07 – 2011-12-08 (×2): 1600 mg via ORAL
  Filled 2011-12-07 (×6): qty 4

## 2011-12-07 MED ORDER — MORPHINE SULFATE 2 MG/ML IJ SOLN
2.0000 mg | Freq: Once | INTRAMUSCULAR | Status: AC
  Administered 2011-12-07: 2 mg via INTRAVENOUS
  Filled 2011-12-07: qty 1

## 2011-12-07 MED ORDER — LOSARTAN POTASSIUM 50 MG PO TABS
50.0000 mg | ORAL_TABLET | Freq: Every day | ORAL | Status: DC
  Administered 2011-12-07 – 2011-12-08 (×2): 50 mg via ORAL
  Filled 2011-12-07 (×2): qty 1

## 2011-12-07 MED ORDER — HYDROCORTISONE ACE-PRAMOXINE 2.5-1 % RE CREA: TOPICAL_CREAM | RECTAL | Status: DC | PRN

## 2011-12-07 MED ORDER — SODIUM CHLORIDE 0.9 % IV SOLN: 250.0000 mL | INTRAVENOUS | Status: DC | PRN

## 2011-12-07 MED ORDER — DIAZEPAM 5 MG PO TABS: 5.0000 mg | ORAL_TABLET | ORAL | Status: DC

## 2011-12-07 MED ORDER — CLOPIDOGREL BISULFATE 75 MG PO TABS
75.0000 mg | ORAL_TABLET | Freq: Every day | ORAL | Status: DC
  Administered 2011-12-07 – 2011-12-08 (×2): 75 mg via ORAL
  Filled 2011-12-07 (×2): qty 1

## 2011-12-07 MED ORDER — NITROGLYCERIN 0.4 MG SL SUBL
0.4000 mg | SUBLINGUAL_TABLET | SUBLINGUAL | Status: DC | PRN
  Administered 2011-12-07 (×3): 0.4 mg via SUBLINGUAL
  Filled 2011-12-07: qty 25

## 2011-12-07 MED ORDER — CEFPODOXIME PROXETIL 200 MG PO TABS
200.0000 mg | ORAL_TABLET | Freq: Two times a day (BID) | ORAL | Status: DC
  Administered 2011-12-07 – 2011-12-08 (×2): 200 mg via ORAL
  Filled 2011-12-07 (×4): qty 1

## 2011-12-07 MED ORDER — SODIUM CHLORIDE 0.9 % IV SOLN
INTRAVENOUS | Status: DC
  Administered 2011-12-07 (×2): via INTRAVENOUS

## 2011-12-07 NOTE — Progress Notes (Signed)
Subjective: Chest discomfort comes and goes. Objective: Filed Vitals:   12/07/11 0635 12/07/11 0645 12/07/11 0700 12/07/11 0800  BP: 148/88 138/73 130/76 130/76  Pulse: 58 64 62 64  Temp:   98.5 F (36.9 C)   TempSrc:   Oral   Resp: _0 Height:      Weight:      SpO2: 100% 94% 99% 96%   Weight change:   Intake/Output Summary (Last 24 hours) at 12/07/11 0836 Last data filed at 12/07/11 0800  Gross per 24 hour  Intake 141.33 ml  Output    180 ml  Net -38.67 ml    General: Alert, awake, oriented x3, in no acute distress Neck:  JVP is normal Heart: Regular rate and rhythm, without murmurs, rubs, gallops.  Lungs: Clear to auscultation.  No rales or wheezes. Exemities:  No edema.   Neuro: Grossly intact, nonfocal.  Tele:  SR.   Lab Results: Results for orders placed during the hospital encounter of 12/07/11 (from the past 24 hour(s))  CBC     Status: Abnormal   Collection Time   12/07/11 12:33 AM      Component Value Range   WBC 13.0 (*) 4.0 - 10.5 K/uL   RBC 4.31  3.87 - 5.11 MIL/uL   Hemoglobin 12.9  12.0 - 15.0 g/dL   HCT 39.0  36.0 - 46.0 %   MCV 90.5  78.0 - 100.0 fL   MCH 29.9  26.0 - 34.0 pg   MCHC 33.1  30.0 - 36.0 g/dL   RDW 14.0  11.5 - 15.5 %   Platelets 447 (*) 150 - 400 K/uL  BASIC METABOLIC PANEL     Status: Abnormal   Collection Time   12/07/11 12:33 AM      Component Value Range   Sodium 139  135 - 145 mEq/L   Potassium 3.7  3.5 - 5.1 mEq/L   Chloride 100  96 - 112 mEq/L   CO2 26  19 - 32 mEq/L   Glucose, Bld 127 (*) 70 - 99 mg/dL   BUN 14  6 - 23 mg/dL   Creatinine, Ser 0.56  0.50 - 1.10 mg/dL   Calcium 10.0  8.4 - 10.5 mg/dL   GFR calc non Af Amer >90  >90 mL/min   GFR calc Af Amer >90  >90 mL/min  PRO B NATRIURETIC PEPTIDE     Status: Normal   Collection Time   12/07/11 12:33 AM      Component Value Range   Pro B Natriuretic peptide (BNP) 71.6  0 - 125 pg/mL  CARDIAC PANEL(CRET KIN+CKTOT+MB+TROPI)     Status: Normal   Collection Time    12/07/11 12:33 AM      Component Value Range   Total CK 77  7 - 177 U/L   CK, MB 1.8  0.3 - 4.0 ng/mL   Troponin I <0.30  <0.30 ng/mL   Relative Index RELATIVE INDEX IS INVALID  0.0 - 2.5  POCT I-STAT TROPONIN I     Status: Normal   Collection Time   12/07/11 12:45 AM      Component Value Range   Troponin i, poc 0.00  0.00 - 0.08 ng/mL   Comment 3           HEPATIC FUNCTION PANEL     Status: Abnormal   Collection Time   12/07/11  1:52 AM      Component Value Range   Total Protein  8.4 (*) 6.0 - 8.3 g/dL   Albumin 4.4  3.5 - 5.2 g/dL   AST 19  0 - 37 U/L   ALT 7  0 - 35 U/L   Alkaline Phosphatase 62  39 - 117 U/L   Total Bilirubin 0.2 (*) 0.3 - 1.2 mg/dL   Bilirubin, Direct <0.1  0.0 - 0.3 mg/dL   Indirect Bilirubin NOT CALCULATED  0.3 - 0.9 mg/dL  LIPASE, BLOOD     Status: Normal   Collection Time   12/07/11  1:52 AM      Component Value Range   Lipase 25  11 - 59 U/L  MRSA PCR SCREENING     Status: Normal   Collection Time   12/07/11  5:42 AM      Component Value Range   MRSA by PCR NEGATIVE  NEGATIVE  CARDIAC PANEL(CRET KIN+CKTOT+MB+TROPI)     Status: Normal   Collection Time   12/07/11  5:45 AM      Component Value Range   Total CK 70  7 - 177 U/L   CK, MB 1.3  0.3 - 4.0 ng/mL   Troponin I <0.30  <0.30 ng/mL   Relative Index RELATIVE INDEX IS INVALID  0.0 - 2.5  PROTIME-INR     Status: Normal   Collection Time   12/07/11  6:21 AM      Component Value Range   Prothrombin Time 13.5  11.6 - 15.2 seconds   INR 1.01  0.00 - 1.49  CBC     Status: Abnormal   Collection Time   12/07/11  6:21 AM      Component Value Range   WBC 11.6 (*) 4.0 - 10.5 K/uL   RBC 4.02  3.87 - 5.11 MIL/uL   Hemoglobin 12.2  12.0 - 15.0 g/dL   HCT 36.5  36.0 - 46.0 %   MCV 90.8  78.0 - 100.0 fL   MCH 30.3  26.0 - 34.0 pg   MCHC 33.4  30.0 - 36.0 g/dL   RDW 14.1  11.5 - 15.5 %   Platelets 436 (*) 150 - 400 K/uL  BASIC METABOLIC PANEL     Status: Abnormal   Collection Time   12/07/11  6:21 AM       Component Value Range   Sodium 142  135 - 145 mEq/L   Potassium 3.8  3.5 - 5.1 mEq/L   Chloride 105  96 - 112 mEq/L   CO2 24  19 - 32 mEq/L   Glucose, Bld 121 (*) 70 - 99 mg/dL   BUN 11  6 - 23 mg/dL   Creatinine, Ser 0.48 (*) 0.50 - 1.10 mg/dL   Calcium 9.6  8.4 - 10.5 mg/dL   GFR calc non Af Amer >90  >90 mL/min   GFR calc Af Amer >90  >90 mL/min  LIPID PANEL     Status: Normal   Collection Time   12/07/11  6:21 AM      Component Value Range   Cholesterol 121  0 - 200 mg/dL   Triglycerides 68  <150 mg/dL   HDL 49  >39 mg/dL   Total CHOL/HDL Ratio 2.5     VLDL 14  0 - 40 mg/dL   LDL Cholesterol 58  0 - 99 mg/dL  GLUCOSE, CAPILLARY     Status: Abnormal   Collection Time   12/07/11  7:54 AM      Component Value Range   Glucose-Capillary 115 (*) 70 -  99 mg/dL    Studies/Results:   Medications: Reviewed.   Patient Active Hospital Problem List:   CAD (coronary artery disease)  Symptoms of chest discomfort come and go.  She has reflux but it has been stable and antacids did not help Discomfort is like a ball in chest radiating to neck, L arm. WIth history and current presentation I would recomm L heart cath to define anatomy.  Patient wants to know what is going on.  Keep on heparin  ASA reaction nausea, vomiting, stomach pain.   Syncope and collapse  Recent admit.  Ulcerative colitis  Followed by Jerilynn Mages. [facility] Plan  No recent flare.  Hyperlipidemia  Good control.     LOS: 0 days   Dorris Carnes 12/07/2011, 8:36 AM

## 2011-12-07 NOTE — Progress Notes (Signed)
Called by RN regarding heparin gtt order (heparin not restarted post cath, but order is still in for heparin).  Pt cathed today, mild nonobstructive CAD.  No CP now.  Enzymes negative.  Will dc heparin gtt order.

## 2011-12-07 NOTE — CV Procedure (Signed)
  Cardiac Catheterization Procedure Note  Name: [name] MRN: [id number] DOB: 06-Nov-1940  Procedure: Left Heart Cath, Selective Coronary Angiography, LV angiography  Indication:  Chest pain and known CAD  Procedural details: The right groin was prepped, draped, and anesthetized with 1% lidocaine. Using modified Seldinger technique, a 5 French sheath was introduced into the right femoral artery. Standard Judkins catheters were used for coronary angiography and left ventriculography. Catheter exchanges were performed over a guidewire. There were no immediate procedural complications. The patient was transferred to the post catheterization recovery area for further monitoring.  Procedural Findings:  Hemodynamics:     AO 125/70    LV 122/4   Coronary angiography:   Coronary dominance: Right  Left mainstem:   Left Main  Left anterior descending (LAD):   Diffuse luminal irregularities.  Proximal stent with mild diffuse luminal irregularities.  D1 small and normal.  D2 tiny with ostial 70% stenosis, D3 moderate sized and normal.  Left circumflex (LCx):  AV groove mild diffuse luminal irregularities.  Mid to distal focal 30% stenosis.  OM1 mild luminal irregularities.  OM2 large and normal.  OM3 small and normal.  PL small and normal.  Right coronary artery (RCA):  Large to moderate sized.  Long proximal 30% stenosis.  Long mid 30% stenosis.  Diffuse luminal irregularities.  PDA moderate sized and normal.    Left ventriculography: Left ventricular systolic function is normal, LVEF is estimated at 65, there is no significant mitral regurgitation   Final Conclusions:  Mild non obstructive plaque with a preserved EF.  Patent LAD stent  Recommendations: Medical management.  Minus Breeding 12/07/2011, 5:52 PM

## 2011-12-07 NOTE — Progress Notes (Addendum)
ANTICOAGULATION CONSULT NOTE -Follow Up Consult  Pharmacy Consult for heparin Indication: chest pain/ACS  Allergies  Allergen Reactions  . Aspirin Nausea And Vomiting  . Azithromycin     REACTION: pt states "ZPak doesn't work"  . Ciprofloxacin Nausea And Vomiting  . Codeine Nausea And Vomiting  . Levofloxacin     REACTION: chest pain  . Oxycodone-Acetaminophen     REACTION: vomiting  . Tramadol Hcl     REACTION: pt states "spaced-out"    Patient Measurements: Height: _0  (157.5 cm) Weight: 209 lb 3.5 oz (94.9 kg) IBW/kg (Calculated) : 50.1  Heparin Dosing Weight: 73 kg   Vital Signs: Temp: 98.5 F (36.9 C) (08/09 0700) Temp src: Oral (08/09 1042) BP: 135/78 mmHg (08/09 1042) Pulse Rate: 62  (08/09 1042)  Labs:  Basename 12/07/11 1243 12/07/11 0621 12/07/11 0545 12/07/11 0033  HGB -- 12.2 -- 12.9  HCT -- 36.5 -- 39.0  PLT -- 436* -- 447*  APTT -- -- -- --  LABPROT -- 13.5 -- --  INR -- 1.01 -- --  HEPARINUNFRC <0.10* -- -- --  CREATININE -- 0.48* -- 0.56  CKTOTAL 60 -- 70 77  CKMB 1.3 -- 1.3 1.8  TROPONINI <0.30 -- <0.30 <0.30    Estimated Creatinine Clearance: 71.2 ml/min (by C-G formula based on Cr of 0.48).   Medical History: Past Medical History  Diagnosis Date  . CHF (congestive heart failure)     Diastolic  . Volume overload   . Bronchiectasis   . Hypertension   . CAD (coronary artery disease)     BMS to the LAD, 2002 / catheterization 2009 LAD stents patent with otherwise mild disease; Managed medically.S/P myoview December 2012 felt to probably be normal.   . Diabetes mellitus   . Obesity   . GERD (gastroesophageal reflux disease)   . Fibromyalgia   . Anxiety   . Allergy, unspecified not elsewhere classified   . Pulmonary nodule     Negative PET in 2010  . Other diseases of lung, not elsewhere classified   . Hypercholesterolemia   . Diverticulosis of colon   . Ulcerative colitis, left sided 2008    Hx of  . Adenomatous colon polyp  02/2006  . Microscopic hematuria   . Neoplasm of kidney   . Stress incontinence, female   . DJD (degenerative joint disease)   . Vitamin D deficiency disease   . Anemia   . Asthma     Medications:  Scheduled:     . amLODipine  10 mg Oral Daily  . atenolol  50 mg Oral Daily  . atorvastatin  10 mg Oral Daily  . cefpodoxime  200 mg Oral BID  . cholecalciferol  5,000 Units Oral Daily  . clopidogrel  75 mg Oral Q breakfast  . diazepam  5 mg Oral On Call  . heparin  4,000 Units Intravenous Once  . insulin aspart  0-9 Units Subcutaneous TID WC  . isosorbide mononitrate  60 mg Oral Daily  . losartan  50 mg Oral Daily  . mesalamine  1,600 mg Oral TID  .  morphine injection  2 mg Intravenous Once  . nabumetone  750 mg Oral BID WC  . ondansetron (ZOFRAN) IV  4 mg Intravenous Once  . ondansetron (ZOFRAN) IV  4 mg Intravenous Once  . pantoprazole  40 mg Oral Q1200  . sodium chloride  3 mL Intravenous Q12H  . DISCONTD: Mesalamine  1,200 mg Oral TID    Assessment:  71 yo female admitted 12/07/2011 with chest pain. Pharmacy consulted to manage IV heparin. Heparin level reported as < 0.1 no bleeding noted, per RN IV has been beeping but infusing without problems.    Goal of Therapy:  Heparin level 0.3-0.7 units/ml Monitor platelets by anticoagulation protocol: Yes   Plan:  1. Heparin 2000 units x 1, then IV infusion of 1200 units/hr. 2. Heparin level in 6 hours or follow up after cath. 3. Daily CBC, heparin level   Thank you for allowing pharmacy to be a part of this patients care team.  Rowe Robert Pharm.D., BCPS Clinical Pharmacist 12/07/2011 2:15 PM Pager: [phone] Phone: [phone]

## 2011-12-07 NOTE — H&P (Signed)
Cardiology History and Physical  NADEL,SCOTT M, MD  History of Present Illness (and review of medical records): [name] is a 71 y.o. female who presents for evaluation of chest pain.  She states that she developed sudden onset of chest pain at rest around 11pm.  Pain was mid sternal, rated 10/10 with radiation to neck, and left arm with associated numbness.  She also had shortness of breath and nausea.  She took one prilosec and NTG at home with no relief.  Her granddaughter brought her to ED for further evaluation.  She states she had similar episode 2 weeks prior where she took 2-3 NTG for relief.  She reports occasional palpitations and mild DOE.  She denies PND, or orthopnea.  She did have repeat episode of chest pain in ED.  Previous diagnostic testing for coronary artery disease includes: cardiac catheterization, echocardiogram and Lexiscan myoview. Previous history of cardiac disease includes Angina Coronary Artery Disease Coronary Artery Stent. Coronary artery disease risk factors include: advanced age (older than 36 for men, 67 for women), diabetes mellitus, dyslipidemia, hypertension and obesity (BMI >= 30 kg/m2). Patient denies history of CABG.  Review of Systems Of note, patient was just recently admitted to [facility] after syncopal episode on 7/28.  She had relatively unremarkable work up.  She was treated for UTI and discharged on 7/31. Further review of systems was otherwise negative other than stated in HPI.  Patient Active Problem List   Diagnosis Date Noted  . Hematuria 11/27/2011  . Syncope and collapse 11/25/2011  . Ulcerative colitis 11/25/2011  . Chronic diastolic CHF (congestive heart failure) 11/25/2011  . Lower extremity edema 11/25/2011  . Hypokalemia 11/25/2011  . Hypomagnesemia 11/25/2011  . Nonischemic cardiomyopathy 04/17/2011  . CAD (coronary artery disease)   . Volume overload   . Bronchiectasis   . Hypertension   . PRURITUS 05/22/2010  .  PERSONAL HX COLONIC POLYPS 02/08/2010  . VITAMIN D DEFICIENCY 12/03/2009  . BRONCHITIS, ACUTE 02/28/2009  . NAUSEA AND VOMITING 12/06/2008  . DEPRESSION 08/10/2008  . PULMONARY NODULE 07/15/2008  . NEOPLASM, KIDNEY 03/16/2008  . MICROSCOPIC HEMATURIA 03/16/2008  . STRESS INCONTINENCE 03/16/2008  . COLONIC POLYPS 07/19/2007  . DIABETES MELLITUS 07/19/2007  . OBESITY 07/19/2007  . ANEMIA 07/19/2007  . ASTHMA 07/19/2007  . ULCERATIVE COLITIS 07/19/2007  . DIVERTICULOSIS OF COLON 07/19/2007  . ANXIETY 03/19/2007  . GERD 03/19/2007  . DEGENERATIVE JOINT DISEASE 03/19/2007  . FIBROMYALGIA 03/19/2007  . ALLERGY 03/19/2007   Past Medical History  Diagnosis Date  . CHF (congestive heart failure)     Diastolic  . Volume overload   . Bronchiectasis   . Hypertension   . CAD (coronary artery disease)     BMS to the LAD, 2002 / catheterization 2009 LAD stents patent with otherwise mild disease; Managed medically.S/P myoview December 2012 felt to probably be normal.   . Diabetes mellitus   . Obesity   . GERD (gastroesophageal reflux disease)   . Fibromyalgia   . Anxiety   . Allergy, unspecified not elsewhere classified   . Pulmonary nodule     Negative PET in 2010  . Other diseases of lung, not elsewhere classified   . Hypercholesterolemia   . Diverticulosis of colon   . Ulcerative colitis, left sided 2008    Hx of  . Adenomatous colon polyp 02/2006  . Microscopic hematuria   . Neoplasm of kidney   . Stress incontinence, female   . DJD (degenerative joint disease)   .  Vitamin D deficiency disease   . Anemia   . Asthma     Past Surgical History  Procedure Date  . Cholecystectomy   . Hand surgery 01/2006    Right Hand Surgery by Dr. Amedeo Plenty  . Coronary stent placement 2002    BMS to the LAD  . Abdominal hysterectomy      (Not in a hospital admission) Allergies  Allergen Reactions  . Aspirin Nausea And Vomiting  . Azithromycin     REACTION: pt states "ZPak doesn't  work"  . Ciprofloxacin Nausea And Vomiting  . Codeine Nausea And Vomiting  . Levofloxacin     REACTION: chest pain  . Oxycodone-Acetaminophen     REACTION: vomiting  . Tramadol Hcl     REACTION: pt states "spaced-out"    History  Substance Use Topics  . Smoking status: Former Smoker -- 2.0 packs/day for 15 years    Types: Cigarettes    Quit date: 04/30/1973  . Smokeless tobacco: Never Used   Comment: ex-smoker: smoked for 10-15 years up to 2ppd, quit 1975.  Marland Kitchen Alcohol Use: Yes     Social drinker    Family History  Problem Relation Age of Onset  . Pneumonia Father   . Heart attack Father   . Parkinsonism Mother   . Sarcoidosis Brother   . Hypertension Sister   . Diabetes Sister   . Colon cancer Neg Hx      Objective: Patient Vitals for the past 8 hrs:  BP Temp Temp src Pulse Resp SpO2  12/07/11 0251 126/69 mmHg 97.9 F (36.6 C) Oral - - 100 %  12/07/11 0207 115/65 mmHg - - 64  20  96 %  12/07/11 0113 110/73 mmHg - - - - 95 %  12/07/11 0048 130/88 mmHg - - - - 99 %   General Appearance:    Alert, cooperative, no distress, appears stated age, obese  Head:    Normocephalic, without obvious abnormality, atraumatic  Eyes:     PERRL, EOMI, anicteric sclerae  Neck:   Supple, no carotid bruit or JVD  Lungs:     Clear to auscultation bilaterally, respirations unlabored  Heart:    Regular rate and rhythm with premature beats, S1 and S2 normal, no murmur  Abdomen:     Soft, non-tender, normoactive bowel sounds  Extremities:   Extremities normal, atraumatic, bilateral edema  Pulses:   2+ and symmetric all extremities  Skin:   no rashes or lesions  Neurologic:   No focal deficits. AAO x3   Results for orders placed during the hospital encounter of 12/07/11 (from the past 48 hour(s))  CBC     Status: Abnormal   Collection Time   12/07/11 12:33 AM      Component Value Range Comment   WBC 13.0 (*) 4.0 - 10.5 K/uL    RBC 4.31  3.87 - 5.11 MIL/uL    Hemoglobin 12.9  12.0 - 15.0  g/dL    HCT 39.0  36.0 - 46.0 %    MCV 90.5  78.0 - 100.0 fL    MCH 29.9  26.0 - 34.0 pg    MCHC 33.1  30.0 - 36.0 g/dL    RDW 14.0  11.5 - 15.5 %    Platelets 447 (*) 150 - 400 K/uL   BASIC METABOLIC PANEL     Status: Abnormal   Collection Time   12/07/11 12:33 AM      Component Value Range Comment   Sodium 139  135 - 145 mEq/L    Potassium 3.7  3.5 - 5.1 mEq/L    Chloride 100  96 - 112 mEq/L    CO2 26  19 - 32 mEq/L    Glucose, Bld 127 (*) 70 - 99 mg/dL    BUN 14  6 - 23 mg/dL    Creatinine, Ser 0.56  0.50 - 1.10 mg/dL    Calcium 10.0  8.4 - 10.5 mg/dL    GFR calc non Af Amer >90  >90 mL/min    GFR calc Af Amer >90  >90 mL/min   PRO B NATRIURETIC PEPTIDE     Status: Normal   Collection Time   12/07/11 12:33 AM      Component Value Range Comment   Pro B Natriuretic peptide (BNP) 71.6  0 - 125 pg/mL   CARDIAC PANEL(CRET KIN+CKTOT+MB+TROPI)     Status: Normal   Collection Time   12/07/11 12:33 AM      Component Value Range Comment   Total CK 77  7 - 177 U/L    CK, MB 1.8  0.3 - 4.0 ng/mL    Troponin I <0.30  <0.30 ng/mL    Relative Index RELATIVE INDEX IS INVALID  0.0 - 2.5   POCT I-STAT TROPONIN I     Status: Normal   Collection Time   12/07/11 12:45 AM      Component Value Range Comment   Troponin i, poc 0.00  0.00 - 0.08 ng/mL    Comment 3            HEPATIC FUNCTION PANEL     Status: Abnormal   Collection Time   12/07/11  1:52 AM      Component Value Range Comment   Total Protein 8.4 (*) 6.0 - 8.3 g/dL    Albumin 4.4  3.5 - 5.2 g/dL    AST 19  0 - 37 U/L    ALT 7  0 - 35 U/L    Alkaline Phosphatase 62  39 - 117 U/L    Total Bilirubin 0.2 (*) 0.3 - 1.2 mg/dL    Bilirubin, Direct <0.1  0.0 - 0.3 mg/dL    Indirect Bilirubin NOT CALCULATED  0.3 - 0.9 mg/dL   LIPASE, BLOOD     Status: Normal   Collection Time   12/07/11  1:52 AM      Component Value Range Comment   Lipase 25  11 - 59 U/L    Chest Portable 1 View  12/07/2011  *RADIOLOGY REPORT*  Clinical Data: Chest pain  radiating to the left jaw.  PORTABLE CHEST - 1 VIEW  Comparison: 07/28/2013from [facility] [facility].  Findings: The cardiac enlargement with mildly prominent pulmonary vascularity.  Interstitial changes may suggest mild interstitial edema.  No blunting of costophrenic angles.  No pneumothorax. Mediastinal contours appear intact.  Calcification of the aorta. No significant change since previous study.  IMPRESSION: Cardiac enlargement with mild pulmonary vascular congestion and interstitial edema.  Original Report Authenticated By: Neale Burly, [name]    ECG:  Sinus rhythm with PACs HR 76 no acute ischemic changes TTE 11/28/11: Left ventricle: The cavity size was normal. Wall thickness was increased in a pattern of mild LVH. Systolic function was normal. The estimated ejection fraction was in the range of 55% to 60%. Wall motion was normal; there were no regional wall motion abnormalities.   Assessment: 12F hx of CAD s/p PCI to LAD in 2002, repeat Cath in 2009 with patent  stents, and normal Lexiscan Nov 2012 presents with acute onset of chest pain concerning for unstable angina.  She is chest pain free at this time, with normal ecg and negative initial cardiac enzyme   Plan:  1. Admit to Cardiology 2. Telemetry Unit 3. Repeat ekg on admit, prn chest pain or arrythmia 4. Trend cardiac biomarkers, check lipids, hgba1c, tsh 5. Medical management to include Plavix, Heparin gtt, BB, Statin, NTG prn 6. Will need to desensitize to ASA if true allergy 7. NPO for repeat stress vs cardiac catheterization.

## 2011-12-07 NOTE — ED Notes (Signed)
Chest pain that began one hour ago radiates from left side of chest into left arm and left neck. Described as stabbing and associated with NAusea and SOB, dizziness. Took one nitro and it helped a little.

## 2011-12-07 NOTE — ED Provider Notes (Signed)
History     CSN: [id number]  Arrival date & time 12/07/11  0026   First MD Initiated Contact with Patient 12/07/11 0103      Chief Complaint  Patient presents with  . Chest Pain    (Consider location/radiation/quality/duration/timing/severity/associated sxs/prior treatment) HPI 71 yo female presents to the ER from home with c/o chest pain, shortness of breath, nausea, dizziness, diaphoresis.  Pt reports pain started about an hour prior to arrival, is substernal, and radiates into her neck and left arm. Patient took a nitroglycerin at home which helped slightly. Patient reports history of prior stent in 2001. Her last cath was 34 years ago. She recently saw her cardiologist, Dr. Ron Parker and had a clean workup. Patient was recently admitted to [facility] long approximately 10 days ago when she had an episode of syncope. Patient was seen yesterday by her urologist and diagnosed with an ecoli urinary tract infection and put on sulfa.  Pt denies fever, chills, no missed medications, no leg or abd swelling.    Past Medical History  Diagnosis Date  . CHF (congestive heart failure)     Diastolic  . Volume overload   . Bronchiectasis   . Hypertension   . CAD (coronary artery disease)     BMS to the LAD, 2002 / catheterization 2009 LAD stents patent with otherwise mild disease; Managed medically.S/P myoview December 2012 felt to probably be normal.   . Diabetes mellitus   . Obesity   . GERD (gastroesophageal reflux disease)   . Fibromyalgia   . Anxiety   . Allergy, unspecified not elsewhere classified   . Pulmonary nodule     Negative PET in 2010  . Other diseases of lung, not elsewhere classified   . Hypercholesterolemia   . Diverticulosis of colon   . Ulcerative colitis, left sided 2008    Hx of  . Adenomatous colon polyp 02/2006  . Microscopic hematuria   . Neoplasm of kidney   . Stress incontinence, female   . DJD (degenerative joint disease)   . Vitamin D deficiency disease   .  Anemia   . Asthma     Past Surgical History  Procedure Date  . Cholecystectomy   . Hand surgery 01/2006    Right Hand Surgery by Dr. Amedeo Plenty  . Coronary stent placement 2002    BMS to the LAD  . Abdominal hysterectomy     Family History  Problem Relation Age of Onset  . Pneumonia Father   . Heart attack Father   . Parkinsonism Mother   . Sarcoidosis Brother   . Hypertension Sister   . Diabetes Sister   . Colon cancer Neg Hx     History  Substance Use Topics  . Smoking status: Former Smoker -- 2.0 packs/day for 15 years    Types: Cigarettes    Quit date: 04/30/1973  . Smokeless tobacco: Never Used   Comment: ex-smoker: smoked for 10-15 years up to 2ppd, quit 1975.  Marland Kitchen Alcohol Use: Yes     Social drinker    OB History    Grav Para Term Preterm Abortions TAB SAB Ect Mult Living                  Review of Systems  All other systems reviewed and are negative.    Allergies  Aspirin; Azithromycin; Ciprofloxacin; Codeine; Levofloxacin; Oxycodone-acetaminophen; and Tramadol hcl  Home Medications   Current Outpatient Rx  Name Route Sig Dispense Refill  . ALBUTEROL SULFATE (2.5 MG/3ML)  0.083% IN NEBU Nebulization Take 2.5 mg by nebulization 3 (three) times daily as needed.    . ALPRAZOLAM 0.5 MG PO TABS Oral Take 0.5 mg by mouth 3 (three) times daily as needed. anxiety    . AMLODIPINE BESYLATE 10 MG PO TABS Oral Take 1 tablet (10 mg total) by mouth daily. 90 tablet 3  . ATENOLOL 50 MG PO TABS Oral Take 1 tablet (50 mg total) by mouth daily. 90 tablet 3  . ATORVASTATIN CALCIUM 10 MG PO TABS Oral Take 1 tablet (10 mg total) by mouth daily. 90 tablet 3  . CEFPODOXIME PROXETIL 200 MG PO TABS Oral Take 1 tablet (200 mg total) by mouth 2 (two) times daily. 4 tablet 0    2 day supply  . VITAMIN D-3 5000 UNITS PO TABS Oral Take 5,000 Units by mouth daily.      Marland Kitchen CLOPIDOGREL BISULFATE 75 MG PO TABS Oral Take 1 tablet (75 mg total) by mouth daily. 90 tablet 3  . FUROSEMIDE 40  MG PO TABS Oral Take 40 mg by mouth 3 (three) times daily.    Marland Kitchen GLIMEPIRIDE 1 MG PO TABS Oral Take 1 mg by mouth daily before breakfast.    . HYDROCODONE-ACETAMINOPHEN 5-500 MG PO CAPS Oral Take 1 capsule by mouth 2 (two) times daily as needed. For pain    . HYDROCORTISONE ACE-PRAMOXINE 2.5-1 % RE CREA Rectal Place rectally as needed. APPLY A SMALL AMOUNT TO AFFECTED AREA.    . ISOSORBIDE MONONITRATE ER 60 MG PO TB24 Oral Take 1 tablet (60 mg total) by mouth daily. 90 tablet 1  . LOSARTAN POTASSIUM 50 MG PO TABS  TAKE 1 TABLET EVERY DAY FOR BLOOD PRESSURE 30 tablet 11  . MESALAMINE 400 MG PO TBEC Oral Take 1,600 mg by mouth 3 (three) times daily.    Marland Kitchen MESALAMINE 1000 MG RE SUPP Rectal Place 1,000 mg rectally as needed.      Marland Kitchen MESALAMINE 400 MG PO CPDR Oral Take 1,200 mg by mouth 3 (three) times daily.    Marland Kitchen NABUMETONE 750 MG PO TABS Oral Take 1 tablet (750 mg total) by mouth 2 (two) times daily with a meal. 180 tablet 3  . NAFTIFINE HCL 2 % EX CREA Apply externally Apply topically 2 (two) times daily.    Marland Kitchen NITROGLYCERIN 0.4 MG SL SUBL Sublingual Place 0.4 mg under the tongue every 5 (five) minutes x 3 doses as needed. For chest pain    . OMEPRAZOLE 40 MG PO CPDR Oral Take 1 capsule (40 mg total) by mouth daily. 90 capsule 3  . POTASSIUM CHLORIDE CRYS ER 20 MEQ PO TBCR Oral Take 1 tablet (20 mEq total) by mouth daily. 90 tablet 3  . PROMETHAZINE HCL 25 MG PO TABS Oral Take 25 mg by mouth 2 (two) times daily as needed. For nausea    . SULFAMETHOXAZOLE-TMP DS 800-160 MG PO TABS Oral Take 1 tablet by mouth 2 (two) times daily. 14 days for UTI    . AMOXICILLIN-POT CLAVULANATE 875-125 MG PO TABS Oral Take 1 tablet by mouth as needed.      . AMOXICILLIN-POT CLAVULANATE 875-125 MG PO TABS Oral Take 1 tablet by mouth 2 (two) times daily. 14 tablet 0  . AMOXICILLIN-POT CLAVULANATE 875-125 MG PO TABS Oral Take 1 tablet by mouth 2 (two) times daily. 14 tablet 0    BP 126/69  Pulse 64  Temp 97.9 F (36.6  C) (Oral)  Resp 20  SpO2 100%  Physical Exam  Nursing note and vitals reviewed. Constitutional: She is oriented to person, place, and time. She appears well-developed and well-nourished.  HENT:  Head: Normocephalic and atraumatic.  Nose: Nose normal.  Mouth/Throat: Oropharynx is clear and moist.  Eyes: Conjunctivae and EOM are normal. Pupils are equal, round, and reactive to light.  Neck: Normal range of motion. Neck supple. No JVD present. No tracheal deviation present. No thyromegaly present.  Cardiovascular: Normal rate, regular rhythm, normal heart sounds and intact distal pulses.  Exam reveals no gallop and no friction rub.   No murmur heard. Pulmonary/Chest: Effort normal and breath sounds normal. No stridor. No respiratory distress. She has no wheezes. She has no rales. She exhibits no tenderness.  Abdominal: Soft. Bowel sounds are normal. She exhibits no distension and no mass. There is no tenderness. There is no rebound and no guarding.  Musculoskeletal: Normal range of motion. She exhibits no edema and no tenderness.  Lymphadenopathy:    She has no cervical adenopathy.  Neurological: She is alert and oriented to person, place, and time. She exhibits normal muscle tone. Coordination normal.  Skin: Skin is warm and dry. No rash noted. No erythema. No pallor.  Psychiatric: She has a normal mood and affect. Her behavior is normal. Judgment and thought content normal.    ED Course  Procedures (including critical care time)  Labs Reviewed  CBC - Abnormal; Notable for the following:    WBC 13.0 (*)     Platelets 447 (*)     All other components within normal limits  BASIC METABOLIC PANEL - Abnormal; Notable for the following:    Glucose, Bld 127 (*)     All other components within normal limits  HEPATIC FUNCTION PANEL - Abnormal; Notable for the following:    Total Protein 8.4 (*)     Total Bilirubin 0.2 (*)     All other components within normal limits  POCT I-STAT  TROPONIN I  PRO B NATRIURETIC PEPTIDE  CARDIAC PANEL(CRET KIN+CKTOT+MB+TROPI)  LIPASE, BLOOD   Chest Portable 1 View  12/07/2011  *RADIOLOGY REPORT*  Clinical Data: Chest pain radiating to the left jaw.  PORTABLE CHEST - 1 VIEW  Comparison: 07/28/2013from  hospital.  Findings: The cardiac enlargement with mildly prominent pulmonary vascularity.  Interstitial changes may suggest mild interstitial edema.  No blunting of costophrenic angles.  No pneumothorax. Mediastinal contours appear intact.  Calcification of the aorta. No significant change since previous study.  IMPRESSION: Cardiac enlargement with mild pulmonary vascular congestion and interstitial edema.  Original Report Authenticated By: Neale Burly, [name]    Date: 12/07/2011  Rate: 76  Rhythm: normal sinus rhythm and premature atrial contractions (PAC)  QRS Axis: normal  Intervals: normal  ST/T Wave abnormalities: normal  Conduction Disutrbances:none  Narrative Interpretation:   Old EKG Reviewed: unchanged    1. Chest pain       MDM  71 year old female with history coronary disease who presents with chest pain with concerning associated symptoms. She does not have ST elevation on EKG. Her initial cardiac markers have been negative. I discussed with on-call [place] cardiology who will see the emergency department.        Kalman Drape, MD 12/07/11 [phone]

## 2011-12-07 NOTE — ED Notes (Signed)
Reports onset of substernal chest pain rad. Into RUQ of abd as well as into left shoulder, neck and jaw - states pain awoke her from sleep at approx 2300 last PM; constant - also endorses "blurry vision" with pain; states took NTG x1 with no relief of symptoms; presents to ED with "numbness" to LUE - denies pain per se; skin dark, warm, dry; (+) cardiac hx

## 2011-12-07 NOTE — Care Management Note (Signed)
    Page 1 of 1   12/07/2011     12:21:44 PM   CARE MANAGEMENT NOTE 12/07/2011  Patient:  [name], [name]   Account Number:  [id number]  Date Initiated:  12/07/2011  Documentation initiated by:  Elissa Hefty  Subjective/Objective Assessment:   adm w ch pain     Action/Plan:   lives w fam, pcp dr scott nadel   Anticipated DC Date:     Anticipated DC Plan:        [place]  CM consult      Choice offered to / List presented to:             Status of service:   Medicare Important Message given?   (If response is "NO", the following Medicare IM given date fields will be blank) Date Medicare IM given:   Date Additional Medicare IM given:    Discharge Disposition:    Per UR Regulation:  Reviewed for med. necessity/level of care/duration of stay  If discussed at [place] of Stay Meetings, dates discussed:    Comments:  8/9 12:20p debbie Corena Tilson rn,bsn [phone]

## 2011-12-07 NOTE — Progress Notes (Signed)
ANTICOAGULATION CONSULT NOTE - Initial Consult  Pharmacy Consult for heparin Indication: chest pain/ACS  Allergies  Allergen Reactions  . Aspirin Nausea And Vomiting  . Azithromycin     REACTION: pt states "ZPak doesn't work"  . Ciprofloxacin Nausea And Vomiting  . Codeine Nausea And Vomiting  . Levofloxacin     REACTION: chest pain  . Oxycodone-Acetaminophen     REACTION: vomiting  . Tramadol Hcl     REACTION: pt states "spaced-out"    Patient Measurements: Height: _0  (157.5 cm) Weight: 209 lb 3.5 oz (94.9 kg) IBW/kg (Calculated) : 50.1  Heparin Dosing Weight: 73 kg   Vital Signs: Temp: 97.9 F (36.6 C) (08/09 0251) Temp src: Oral (08/09 0251) BP: 124/76 mmHg (08/09 0530) Pulse Rate: 66  (08/09 0518)  Labs:  Basename 12/07/11 0033  HGB 12.9  HCT 39.0  PLT 447*  APTT --  LABPROT --  INR --  HEPARINUNFRC --  CREATININE 0.56  CKTOTAL 77  CKMB 1.8  TROPONINI <0.30    Estimated Creatinine Clearance: 71.2 ml/min (by C-G formula based on Cr of 0.56).   Medical History: Past Medical History  Diagnosis Date  . CHF (congestive heart failure)     Diastolic  . Volume overload   . Bronchiectasis   . Hypertension   . CAD (coronary artery disease)     BMS to the LAD, 2002 / catheterization 2009 LAD stents patent with otherwise mild disease; Managed medically.S/P myoview December 2012 felt to probably be normal.   . Diabetes mellitus   . Obesity   . GERD (gastroesophageal reflux disease)   . Fibromyalgia   . Anxiety   . Allergy, unspecified not elsewhere classified   . Pulmonary nodule     Negative PET in 2010  . Other diseases of lung, not elsewhere classified   . Hypercholesterolemia   . Diverticulosis of colon   . Ulcerative colitis, left sided 2008    Hx of  . Adenomatous colon polyp 02/2006  . Microscopic hematuria   . Neoplasm of kidney   . Stress incontinence, female   . DJD (degenerative joint disease)   . Vitamin D deficiency disease   .  Anemia   . Asthma     Medications:  Scheduled:    . amLODipine  10 mg Oral Daily  . atenolol  50 mg Oral Daily  . atorvastatin  10 mg Oral Daily  . cefpodoxime  200 mg Oral BID  . cholecalciferol  5,000 Units Oral Daily  . clopidogrel  75 mg Oral Q breakfast  . insulin aspart  0-9 Units Subcutaneous TID WC  . isosorbide mononitrate  60 mg Oral Daily  . losartan  50 mg Oral Daily  . Mesalamine  1,200 mg Oral TID  . mesalamine  1,600 mg Oral TID  .  morphine injection  2 mg Intravenous Once  . nabumetone  750 mg Oral BID WC  . ondansetron (ZOFRAN) IV  4 mg Intravenous Once  . ondansetron (ZOFRAN) IV  4 mg Intravenous Once  . pantoprazole  40 mg Oral Q1200    Assessment: 71 yo female admitted with chest pain. Pharmacy consulted to manage IV heparin.   Goal of Therapy:  Heparin level 0.3-0.7 units/ml Monitor platelets by anticoagulation protocol: Yes   Plan:  1. Heparin 4000 units x 1, then IV infusion of 900 units/hr. 2. Heparin level in 6 hours.  3. Daily CBC, heparin level  Otila Back 12/07/2011,6:13 AM

## 2011-12-07 NOTE — ED Notes (Signed)
Per [facility] pharmacist, pt had rx for Septra DS filled on 12/05/11; Dr. Sharol Given made aware of same

## 2011-12-07 NOTE — ED Notes (Signed)
Pt ambulated to restroom. 

## 2011-12-08 ENCOUNTER — Encounter (HOSPITAL_COMMUNITY): Payer: Self-pay | Admitting: Cardiology

## 2011-12-08 LAB — GLUCOSE, CAPILLARY: Glucose-Capillary: 115 mg/dL — ABNORMAL HIGH (ref 70–99)

## 2011-12-08 LAB — BASIC METABOLIC PANEL
BUN: 13 mg/dL (ref 6–23)
Chloride: 106 mEq/L (ref 96–112)
GFR calc non Af Amer: >90 mL/min (ref >90)
Sodium: 141 mEq/L (ref 135–145)

## 2011-12-08 LAB — CBC
MCH: 30.1 pg (ref 26.0–34.0)
MCV: 91.6 fL (ref 78.0–100.0)
Platelets: 389 10*3/uL (ref 150–400)
RDW: 14.3 % (ref 11.5–15.5)
WBC: 9.5 10*3/uL (ref 4.0–10.5)

## 2011-12-08 MED ORDER — PANTOPRAZOLE SODIUM 40 MG PO TBEC: 40.0000 mg | DELAYED_RELEASE_TABLET | Freq: Every day | ORAL | Status: DC

## 2011-12-08 MED ORDER — CEFPODOXIME PROXETIL 200 MG PO TABS
200.0000 mg | ORAL_TABLET | Freq: Two times a day (BID) | ORAL | Status: AC
Start: 2011-12-08 — End: 2011-12-09

## 2011-12-08 NOTE — Progress Notes (Signed)
Discharged home accampanied by son, stable, belongings with pt. Discharge instructions given to pt.

## 2011-12-08 NOTE — Progress Notes (Signed)
SUBJECTIVE:  No further chest pain.  No SOB   PHYSICAL EXAM Filed Vitals:   12/07/11 2327 12/08/11 0324 12/08/11 0740 12/08/11 0800  BP:  120/71 124/60   Pulse:      Temp: 97.7 F (36.5 C) 97.7 F (36.5 C)  97.9 F (36.6 C)  TempSrc: Oral Oral  Oral  Resp:  17 19   Height:      Weight:      SpO2: 93% 94% 97% 91%   General:  No distres Lungs:  Clear Heart:  RRR Abdomen:  Positive bowel sounds, no rebound no guarding Extremities:  Right groin without hematoma or echymosis.  LABS: Lab Results  Component Value Date   CKTOTAL 60 12/07/2011   CKMB 1.3 12/07/2011   TROPONINI <0.30 12/07/2011   Results for orders placed during the hospital encounter of 12/07/11 (from the past 24 hour(s))  CARDIAC PANEL(CRET KIN+CKTOT+MB+TROPI)     Status: Normal   Collection Time   12/07/11 12:43 PM      Component Value Range   Total CK 60  7 - 177 U/L   CK, MB 1.3  0.3 - 4.0 ng/mL   Troponin I <0.30  <0.30 ng/mL   Relative Index RELATIVE INDEX IS INVALID  0.0 - 2.5  HEPARIN LEVEL (UNFRACTIONATED)     Status: Abnormal   Collection Time   12/07/11 12:43 PM      Component Value Range   Heparin Unfractionated <0.10 (*) 0.30 - 0.70 IU/mL  GLUCOSE, CAPILLARY     Status: Abnormal   Collection Time   12/07/11  3:59 PM      Component Value Range   Glucose-Capillary 107 (*) 70 - 99 mg/dL  POCT ACTIVATED CLOTTING TIME     Status: Normal   Collection Time   12/07/11  5:45 PM      Component Value Range   Activated Clotting Time 140    GLUCOSE, CAPILLARY     Status: Abnormal   Collection Time   12/07/11  6:00 PM      Component Value Range   Glucose-Capillary 102 (*) 70 - 99 mg/dL  GLUCOSE, CAPILLARY     Status: Abnormal   Collection Time   12/07/11 11:12 PM      Component Value Range   Glucose-Capillary 131 (*) 70 - 99 mg/dL  GLUCOSE, CAPILLARY     Status: Abnormal   Collection Time   12/08/11  8:01 AM      Component Value Range   Glucose-Capillary 115 (*) 70 - 99 mg/dL   Comment 1 Notify RN    CBC      Status: Abnormal   Collection Time   12/08/11  8:19 AM      Component Value Range   WBC 9.5  4.0 - 10.5 K/uL   RBC 3.82 (*) 3.87 - 5.11 MIL/uL   Hemoglobin 11.5 (*) 12.0 - 15.0 g/dL   HCT 35.0 (*) 36.0 - 46.0 %   MCV 91.6  78.0 - 100.0 fL   MCH 30.1  26.0 - 34.0 pg   MCHC 32.9  30.0 - 36.0 g/dL   RDW 14.3  11.5 - 15.5 %   Platelets 389  150 - 400 K/uL  BASIC METABOLIC PANEL     Status: Abnormal   Collection Time   12/08/11  8:19 AM      Component Value Range   Sodium 141  135 - 145 mEq/L   Potassium 3.9  3.5 - 5.1 mEq/L  Chloride 106  96 - 112 mEq/L   CO2 26  19 - 32 mEq/L   Glucose, Bld 128 (*) 70 - 99 mg/dL   BUN 13  6 - 23 mg/dL   Creatinine, Ser 0.44 (*) 0.50 - 1.10 mg/dL   Calcium 9.1  8.4 - 10.5 mg/dL   GFR calc non Af Amer >90  >90 mL/min   GFR calc Af Amer >90  >90 mL/min    Intake/Output Summary (Last 24 hours) at 12/08/11 1140 Last data filed at 12/08/11 1100  Gross per 24 hour  Intake   2122 ml  Output    552 ml  Net   1570 ml     ASSESSMENT AND PLAN:  CAD (coronary artery disease):  Cath yesterday with mild non obstructive plaque with a preserved EF. Patent LAD stent.  Medical management.    Syncope and collapse:  No acute events.  No further work up.   Ulcerative colitis :  Continue previous management  Mild anemia:  Follow up CBC in one week.  OK to go home.  Follow up with Dr. Ron Parker or extender within the next two weeks.     Jeneen Rinks Elania Crowl 12/08/2011 11:40 AM

## 2011-12-08 NOTE — Discharge Summary (Signed)
Discharge Summary   Patient ID: AYELET GRUENEWALD MRN: [id number], DOB/AGE: 1940-05-16 71 y.o.  Primary MD: Noralee Space, MD Primary Cardiologist: Dr. Ron Parker Admit date: 12/07/2011 D/C date:     12/08/2011      Primary Discharge Diagnoses:  1. Chest Pain/Coronary Artery Disease  - H/o BMS to the LAD, 2002   - Cath 12/07/11 patent LAD stent and mild nonobstructive disease, EF 65%; Medical management  2. Chronic Anemia  - H&H stable  - F/u CBC next week  Secondary Discharge Diagnoses:  1. Chronic diastolic CHF 2. Nonischemic Cardiomyopathy 3. Diabetes Mellitus 4. Dyslipidemia 5. Hypertension 6. Syncope 7. Obesity 8. Ulcerative Colitis 9. Pulmonary nodule Negative PET in 2010 10. Bronchiectasis 11. Colonic Polyps 12. Depression 13. Neoplasm of Kidney 14. Diverticulosis 15. Anxiety 16. GERD 17. DJD 18. Fibromyalgia 19. Cholecystectomy   20. Hand surgery 01/2006 Right Hand Surgery by Dr. Amedeo Plenty   21. Abdominal hysterectomy    Allergies Allergies  Allergen Reactions  . Aspirin Nausea And Vomiting  . Azithromycin     REACTION: pt states "ZPak doesn't work"  . Ciprofloxacin Nausea And Vomiting  . Codeine Nausea And Vomiting  . Levofloxacin     REACTION: chest pain  . Oxycodone-Acetaminophen     REACTION: vomiting  . Tramadol Hcl     REACTION: pt states "spaced-out"    Diagnostic Studies/Procedures:   12/07/11 - Cardiac Cath Hemodynamics:  AO 125/70  LV 122/4  Coronary angiography:  Coronary dominance: Right  Left mainstem: Left Main  Left anterior descending (LAD): Diffuse luminal irregularities. Proximal stent with mild diffuse luminal irregularities. D1 small and normal. D2 tiny with ostial 70% stenosis, D3 moderate sized and normal.  Left circumflex (LCx): AV groove mild diffuse luminal irregularities. Mid to distal focal 30% stenosis. OM1 mild luminal irregularities. OM2 large and normal. OM3 small and normal. PL small and normal.  Right coronary artery  (RCA): Large to moderate sized. Long proximal 30% stenosis. Long mid 30% stenosis. Diffuse luminal irregularities. PDA moderate sized and normal.  Left ventriculography: Left ventricular systolic function is normal, LVEF is estimated at 65, there is no significant mitral regurgitation  Final Conclusions: Mild non obstructive plaque with a preserved EF. Patent LAD stent  Recommendations: Medical management.   History of Present Illness: 71 y.o. female w/ the above medical problems who presented to [facility] on 12/07/11 with complaints of chest pain. She reported sudden onset of chest pain at rest the evening prior to presentation associated with sob and nausea. It was not relieved with prilosec or NTG prompting her to present to the ED.  Hospital Course: In the ED, EKG revealed NSR with no acute ST/T changes. CXR showed mild pulmonary vascular congestion and interstitial edema. Labs were significant for normal cardiac enzymes, WBC 13.0, BNP 71.6, otherwise unremarkable CBC/CMET. She was admitted for further evaluation and treatment.   Cardiac enzymes were cycled and remained negative. Given her cardiac history and symptoms concerning for unstable angina cardiac catheterization was performed revealing patent LAD stent and mild nonobstructive disease. She tolerated the procedure well without complications. Recommendations were made for continued medical management. On day of discharge she was chest pain free. Cath site was stable. She was seen and evaluated by Dr. Percival Spanish who felt she was stable for discharge home with plans for follow up as scheduled below.  Discharge Vitals: Blood pressure 123/81, pulse 60, temperature 98.1 F (36.7 C), temperature source Oral, resp. rate 18, height _0  (1.575 m), weight  209 lb 3.5 oz (94.9 kg), SpO2 93.00%.  Labs: Component Value Date   WBC 9.5 12/08/2011   HGB 11.5* 12/08/2011   HCT 35.0* 12/08/2011   MCV 91.6 12/08/2011   PLT 389 12/08/2011    Lab  12/08/11 0819 12/07/11 0152  NA 141 --  K 3.9 --  CL 106 --  CO2 26 --  BUN 13 --  CREATININE 0.44* --  CALCIUM 9.1 --  PROT -- 8.4*  BILITOT -- 0.2*  ALKPHOS -- 62  ALT -- 7  AST -- 19  GLUCOSE 128* --   Basename 12/07/11 1243 12/07/11 0545 12/07/11 0033  CKTOTAL 60 70 77  CKMB 1.3 1.3 1.8  TROPONINI <0.30 <0.30 <0.30   Component Value Date   CHOL 121 12/07/2011   HDL 49 12/07/2011   LDLCALC 58 12/07/2011   TRIG 68 12/07/2011     Discharge Medications   Medication List  As of 12/08/2011  2:58 PM   STOP taking these medications         amoxicillin-clavulanate 875-125 MG per tablet      omeprazole 40 MG capsule      sulfamethoxazole-trimethoprim 800-160 MG per tablet         TAKE these medications         albuterol (2.5 MG/3ML) 0.083% nebulizer solution   Commonly known as: PROVENTIL   Take 2.5 mg by nebulization 3 (three) times daily as needed.      ALPRAZolam 0.5 MG tablet   Commonly known as: XANAX   Take 0.5 mg by mouth 3 (three) times daily as needed. anxiety      amLODipine 10 MG tablet   Commonly known as: NORVASC   Take 1 tablet (10 mg total) by mouth daily.      ANALPRAM-HC 2.5-1 % rectal cream   Generic drug: hydrocortisone-pramoxine   Place rectally as needed. APPLY A SMALL AMOUNT TO AFFECTED AREA.      atenolol 50 MG tablet   Commonly known as: TENORMIN   Take 1 tablet (50 mg total) by mouth daily.      atorvastatin 10 MG tablet   Commonly known as: LIPITOR   Take 1 tablet (10 mg total) by mouth daily.      cefpodoxime 200 MG tablet   Commonly known as: VANTIN   Take 1 tablet (200 mg total) by mouth 2 (two) times daily.      clopidogrel 75 MG tablet   Commonly known as: PLAVIX   Take 1 tablet (75 mg total) by mouth daily.      furosemide 40 MG tablet   Commonly known as: LASIX   Take 40 mg by mouth 3 (three) times daily.      glimepiride 1 MG tablet   Commonly known as: AMARYL   Take 1 mg by mouth daily before breakfast.       hydrocodone-acetaminophen 5-500 MG per capsule   Commonly known as: LORCET-HD   Take 1 capsule by mouth 2 (two) times daily as needed. For pain      isosorbide mononitrate 60 MG 24 hr tablet   Commonly known as: IMDUR   Take 1 tablet (60 mg total) by mouth daily.      losartan 50 MG tablet   Commonly known as: COZAAR   TAKE 1 TABLET EVERY DAY FOR BLOOD PRESSURE      mesalamine 400 MG EC tablet   Commonly known as: ASACOL   Take 1,600 mg by mouth 3 (three) times  daily.      nabumetone 750 MG tablet   Commonly known as: RELAFEN   Take 1 tablet (750 mg total) by mouth 2 (two) times daily with a meal.      NAFTIN 2 % Crea   Generic drug: Naftifine HCl   Apply topically 2 (two) times daily.      nitroGLYCERIN 0.4 MG SL tablet   Commonly known as: NITROSTAT   Place 0.4 mg under the tongue every 5 (five) minutes x 3 doses as needed. For chest pain      pantoprazole 40 MG tablet   Commonly known as: PROTONIX   Take 1 tablet (40 mg total) by mouth daily at 12 noon.      potassium chloride SA 20 MEQ tablet   Commonly known as: K-DUR,KLOR-CON   Take 1 tablet (20 mEq total) by mouth daily.      promethazine 25 MG tablet   Commonly known as: PHENERGAN   Take 25 mg by mouth 2 (two) times daily as needed. For nausea      Vitamin D-3 5000 UNITS Tabs   Take 5,000 Units by mouth daily.            Disposition   Discharge Orders    Future Appointments: Provider: Department: Dept Phone: Center:   02/06/2012 9:30 AM Noralee Space, MD [facility] [phone] None     Future Orders Please Complete By Expires   Diet - low sodium heart healthy      Increase activity slowly      Discharge instructions      Comments:   **PLEASE REMEMBER TO BRING ALL OF YOUR MEDICATIONS TO EACH OF YOUR FOLLOW-UP OFFICE VISITS.  * KEEP GROIN SITE CLEAN AND DRY. Call the office for any signs of bleedings, pus, swelling, increased pain, or any other concerns. * NO HEAVY LIFTING (>10lbs) OR  SEXUAL ACTIVITY X 7 DAYS. * NO DRIVING X 2-3 DAYS. * NO SOAKING BATHS, HOT TUBS, POOLS, ETC., X 7 DAYS.  * Your prilosec was changed to protonix because of its interaction with plavix.  * Your blood counts were low, but stable and will need to be rechecked next week.        Outstanding Labs/Studies:  1. CBC next week  Duration of Discharge Encounter: Greater than 30 minutes including physician and PA time.  Signed, HOPE, JESSICA PA-C 12/08/2011, 2:58 PM Patient seen and examined.  Plan as discussed in my rounding note for today and outlined above. Minus Breeding  12/08/2011  3:11 PM

## 2011-12-10 ENCOUNTER — Telehealth: Payer: Self-pay | Admitting: Cardiology

## 2011-12-10 NOTE — Telephone Encounter (Signed)
New msg Pt wants to talk to you about what meds she can take.

## 2011-12-10 NOTE — Telephone Encounter (Signed)
Pt calling to report that she hasn't had a bm since discharge from the hospital.  She states she used an enema.

## 2011-12-18 ENCOUNTER — Encounter: Payer: Self-pay | Admitting: Nurse Practitioner

## 2011-12-18 ENCOUNTER — Ambulatory Visit (INDEPENDENT_AMBULATORY_CARE_PROVIDER_SITE_OTHER): Payer: Medicare Other | Admitting: Nurse Practitioner

## 2011-12-18 VITALS — BP 118/78 | HR 61 | Ht 62.0 in | Wt 207.1 lb

## 2011-12-18 DIAGNOSIS — I2510 Atherosclerotic heart disease of native coronary artery without angina pectoris: Secondary | ICD-10-CM

## 2011-12-18 DIAGNOSIS — Z9889 Other specified postprocedural states: Secondary | ICD-10-CM | POA: Diagnosis not present

## 2011-12-18 DIAGNOSIS — D649 Anemia, unspecified: Secondary | ICD-10-CM

## 2011-12-18 LAB — CBC WITH DIFFERENTIAL/PLATELET
Basophils Absolute: 0 10*3/uL (ref 0.0–0.1)
Basophils Relative: 0.4 % (ref 0.0–3.0)
Eosinophils Absolute: 0.3 10*3/uL (ref 0.0–0.7)
Eosinophils Relative: 3.6 % (ref 0.0–5.0)
HCT: 34.5 % — ABNORMAL LOW (ref 36.0–46.0)
Hemoglobin: 11.2 g/dL — ABNORMAL LOW (ref 12.0–15.0)
Lymphocytes Relative: 20.4 % (ref 12.0–46.0)
Lymphs Abs: 1.8 10*3/uL (ref 0.7–4.0)
MCHC: 32.5 g/dL (ref 30.0–36.0)
MCV: 92.4 fl (ref 78.0–100.0)
Monocytes Absolute: 0.6 10*3/uL (ref 0.1–1.0)
Monocytes Relative: 6.3 % (ref 3.0–12.0)
Neutro Abs: 6.2 10*3/uL (ref 1.4–7.7)
Neutrophils Relative %: 69.3 % (ref 43.0–77.0)
Platelets: 436 10*3/uL — ABNORMAL HIGH (ref 150.0–400.0)
RBC: 3.73 Mil/uL — ABNORMAL LOW (ref 3.87–5.11)
RDW: 14.2 % (ref 11.5–14.6)
WBC: 8.9 10*3/uL (ref 4.5–10.5)

## 2011-12-18 LAB — BASIC METABOLIC PANEL
BUN: 17 mg/dL (ref 6–23)
CO2: 28 mEq/L (ref 19–32)
Calcium: 9.3 mg/dL (ref 8.4–10.5)
Chloride: 102 mEq/L (ref 96–112)
Creatinine, Ser: 0.5 mg/dL (ref 0.4–1.2)
GFR: 150.19 mL/min (ref >60.00)
Glucose, Bld: 123 mg/dL — ABNORMAL HIGH (ref 70–99)
Potassium: 3.7 mEq/L (ref 3.5–5.1)
Sodium: 140 mEq/L (ref 135–145)

## 2011-12-18 NOTE — Progress Notes (Signed)
[name] Date of Birth: [birth year] Medical Record #[id number]  History of Present Illness: Ms. Hagan is seen back today for a post cath visit. She is seen for Dr. Ron Parker. She has known CAD and has had recent cath. She will continue with medical management. Her stent was patent. Her medicines are unchanged. Her other issues are as outlined below.   She comes in today. She is here with her family. She is doing ok. Still weak and fatigued. Not able to do much do to chronic back pain. Does need her labs rechecked. No problems with her groin.   Current Outpatient Prescriptions on File Prior to Visit  Medication Sig Dispense Refill  . albuterol (PROVENTIL) (2.5 MG/3ML) 0.083% nebulizer solution Take 2.5 mg by nebulization 3 (three) times daily as needed.      . ALPRAZolam (XANAX) 0.5 MG tablet Take 0.5 mg by mouth 3 (three) times daily as needed. anxiety      . amLODipine (NORVASC) 10 MG tablet Take 1 tablet (10 mg total) by mouth daily.  90 tablet  3  . atenolol (TENORMIN) 50 MG tablet Take 1 tablet (50 mg total) by mouth daily.  90 tablet  3  . atorvastatin (LIPITOR) 10 MG tablet Take 1 tablet (10 mg total) by mouth daily.  90 tablet  3  . Cholecalciferol (VITAMIN D-3) 5000 UNITS TABS Take 5,000 Units by mouth daily.        . clopidogrel (PLAVIX) 75 MG tablet Take 1 tablet (75 mg total) by mouth daily.  90 tablet  3  . furosemide (LASIX) 40 MG tablet Take 40 mg by mouth 3 (three) times daily.      Marland Kitchen glimepiride (AMARYL) 1 MG tablet Take 1 mg by mouth daily before breakfast.      . hydrocodone-acetaminophen (LORCET-HD) 5-500 MG per capsule Take 1 capsule by mouth 2 (two) times daily as needed. For pain      . hydrocortisone-pramoxine (ANALPRAM-HC) 2.5-1 % rectal cream Place rectally as needed. APPLY A SMALL AMOUNT TO AFFECTED AREA.      Marland Kitchen isosorbide mononitrate (IMDUR) 60 MG 24 hr tablet Take 1 tablet (60 mg total) by mouth daily.  90 tablet  1  . losartan (COZAAR) 50 MG tablet TAKE 1  TABLET EVERY DAY FOR BLOOD PRESSURE  30 tablet  11  . mesalamine (ASACOL) 400 MG EC tablet Take 1,600 mg by mouth 3 (three) times daily.      . nabumetone (RELAFEN) 750 MG tablet Take 1 tablet (750 mg total) by mouth 2 (two) times daily with a meal.  180 tablet  3  . Naftifine HCl (NAFTIN) 2 % CREA Apply topically 2 (two) times daily.      . nitroGLYCERIN (NITROSTAT) 0.4 MG SL tablet Place 0.4 mg under the tongue every 5 (five) minutes x 3 doses as needed. For chest pain      . pantoprazole (PROTONIX) 40 MG tablet Take 1 tablet (40 mg total) by mouth daily at 12 noon.  30 tablet  6  . promethazine (PHENERGAN) 25 MG tablet Take 25 mg by mouth 2 (two) times daily as needed. For nausea      . DISCONTD: potassium chloride SA (K-DUR,KLOR-CON) 20 MEQ tablet Take 1 tablet (20 mEq total) by mouth daily.  90 tablet  3    Allergies  Allergen Reactions  . Aspirin Nausea And Vomiting  . Azithromycin     REACTION: pt states "ZPak doesn't work"  . Ciprofloxacin Nausea And  Vomiting  . Codeine Nausea And Vomiting  . Levofloxacin     REACTION: chest pain  . Oxycodone-Acetaminophen     REACTION: vomiting  . Tramadol Hcl     REACTION: pt states "spaced-out"    Past Medical History  Diagnosis Date  . CHF (congestive heart failure)     Diastolic  . Volume overload   . Bronchiectasis   . Hypertension   . CAD (coronary artery disease)     BMS to the LAD, 2002; cath 12/07/11 patent LAD stent and mild nonobstructive disease, EF 65%; Medical management  . Diabetes mellitus   . Obesity   . GERD (gastroesophageal reflux disease)   . Fibromyalgia   . Anxiety   . Allergy, unspecified not elsewhere classified   . Pulmonary nodule     Negative PET in 2010  . Other diseases of lung, not elsewhere classified   . Hypercholesterolemia   . Diverticulosis of colon   . Ulcerative colitis, left sided 2008    Hx of  . Adenomatous colon polyp 02/2006  . Microscopic hematuria   . Neoplasm of kidney   . Stress  incontinence, female   . DJD (degenerative joint disease)   . Vitamin D deficiency disease   . Anemia   . Syncope   . UTI (lower urinary tract infection)     Past Surgical History  Procedure Date  . Cholecystectomy   . Hand surgery 01/2006    Right Hand Surgery by Dr. Amedeo Plenty  . Coronary stent placement 2002    BMS to the LAD  . Abdominal hysterectomy     History  Smoking status  . Former Smoker -- 2.0 packs/day for 15 years  . Types: Cigarettes  . Quit date: 04/30/1973  Smokeless tobacco  . Never Used  Comment: ex-smoker: smoked for 10-15 years up to 2ppd, quit 1975.    History  Alcohol Use No    Social drinker    Family History  Problem Relation Age of Onset  . Pneumonia Father   . Heart attack Father   . Parkinsonism Mother   . Sarcoidosis Brother   . Hypertension Sister   . Diabetes Sister   . Colon cancer Neg Hx     Review of Systems: The review of systems is positive for chronic fatigue.  All other systems were reviewed and are negative.  Physical Exam: BP 118/78  Pulse 61  Ht _0  (1.575 m)  Wt 207 lb 1.9 oz (93.949 kg)  BMI 37.88 kg/m2 Patient is very pleasant and in no acute distress. She is obese. Skin is warm and dry. Color is normal.  HEENT is unremarkable. Normocephalic/atraumatic. PERRL. Sclera are nonicteric. Neck is supple. No masses. No JVD. Lungs are clear. Cardiac exam shows a regular rate and rhythm. Abdomen is obese but soft. Extremities are without edema. Right groin looks ok. Gait and ROM are intact. She is using a cane.  No gross neurologic deficits noted.   LABORATORY DATA:  BMET and CBC are pending.   Lab Results  Component Value Date   WBC 9.5 12/08/2011   HGB 11.5* 12/08/2011   HCT 35.0* 12/08/2011   PLT 389 12/08/2011   GLUCOSE 128* 12/08/2011   CHOL 121 12/07/2011   TRIG 68 12/07/2011   HDL 49 12/07/2011   LDLCALC 58 12/07/2011   ALT 7 12/07/2011   AST 19 12/07/2011   NA 141 12/08/2011   K 3.9 12/08/2011   CL 106 12/08/2011    CREATININE 0.44*  12/08/2011   BUN 13 12/08/2011   CO2 26 12/08/2011   TSH 1.139 11/25/2011   INR 1.01 12/07/2011   HGBA1C 7.1* 11/25/2011   Cardiac Cath Conclusions:  Left mainstem: Left Main  Left anterior descending (LAD): Diffuse luminal irregularities. Proximal stent with mild diffuse luminal irregularities. D1 small and normal. D2 tiny with ostial 70% stenosis, D3 moderate sized and normal.  Left circumflex (LCx): AV groove mild diffuse luminal irregularities. Mid to distal focal 30% stenosis. OM1 mild luminal irregularities. OM2 large and normal. OM3 small and normal. PL small and normal.  Right coronary artery (RCA): Large to moderate sized. Long proximal 30% stenosis. Long mid 30% stenosis. Diffuse luminal irregularities. PDA moderate sized and normal.  Left ventriculography: Left ventricular systolic function is normal, LVEF is estimated at 65, there is no significant mitral regurgitation  Final Conclusions: Mild non obstructive plaque with a preserved EF. Patent LAD stent   Recommendations: Medical management.   Minus Breeding  12/07/2011, 5:52 PM  Assessment / Plan: 1. S/P cardiac cath - she has had stable findings. Will continue with medical management. Unfortunately she is not very active and is quite limited by her back pain.   2. Mild anemia - will recheck her labs today.   3. HTN - blood pressure looks good on her current regimen.   We will see her back in December as planned and will otherwise defer her management to her PCP.   Patient is agreeable to this plan and will call if any problems develop in the interim.

## 2011-12-18 NOTE — Patient Instructions (Addendum)
We will recheck your labs today  See Dr. Ron Parker in December as planned  Stay on your other medicines.  Call the [facility] office at [phone] if you have any questions, problems or concerns.

## 2011-12-19 ENCOUNTER — Telehealth: Payer: Self-pay | Admitting: Pulmonary Disease

## 2011-12-19 ENCOUNTER — Telehealth: Payer: Self-pay | Admitting: *Deleted

## 2011-12-19 NOTE — Telephone Encounter (Signed)
Message copied by Iona Hansen on Wed Dec 19, 2011  8:40 AM ------      Message from: Burtis Junes      Created: Tue Dec 18, 2011 12:59 PM       Ok to report. Labs are satisfactory but still a little anemic. She needs to limit her NSAID use (her relafen). Would recheck her CBC with her PCP in the next 2 to 3 weeks.

## 2011-12-19 NOTE — Telephone Encounter (Signed)
Pt given Dr Jeannine Kitten recommendations regarding iron supplements and rechecking labs at next ov.  Pt verbalized understanding.

## 2011-12-19 NOTE — Telephone Encounter (Signed)
Called patient to notify of lab results.  She stated that she "would not back off the relafen"  She "could not because she wouldn't be able to walk"  I asked if she could at least see her PCP within the next 2-3 weeks to have her counts rechecked.  She stated that she is not going to PCP until October and that she could not use anything OTC because they dont work.  I advised her of Lori's recommendations.  She said that she would contact her PCP because she is fatigued.  Concepcion Living, CMA

## 2011-12-19 NOTE — Telephone Encounter (Signed)
Per SN---hg is 11.2--only minimal anemia---normal level is 12-15---SN reviewed all of her hospital labs from 7/13 through 8/13---recs to take otc iron tablets---ferrous sulfate or feosol  1 daily.  Keep her appt  On 10/09 to recheck her with follow up labs.  thanks

## 2011-12-19 NOTE — Telephone Encounter (Signed)
Pt had recent CBC checked by caridology and was told that she is anemic and to limit her use of Relafen.  Pt reports that she is unable to cut back on Relafen due to her fibromyalgia.  Pt also reports that she feels tired all the time.  Pt was told to have PCP to recheck CBC in 2-3 weeks.  PT has appt with Dr Lenna Gilford on 02-06-12 and didnt know if she could wait until then.  Please advise on what pt should do about Relafen, ? Med for anemia and when to recheck CBC.

## 2011-12-25 DIAGNOSIS — M79609 Pain in unspecified limb: Secondary | ICD-10-CM | POA: Diagnosis not present

## 2011-12-25 DIAGNOSIS — B351 Tinea unguium: Secondary | ICD-10-CM | POA: Diagnosis not present

## 2011-12-28 ENCOUNTER — Telehealth: Payer: Self-pay | Admitting: Pulmonary Disease

## 2011-12-28 MED ORDER — MONTELUKAST SODIUM 10 MG PO TABS: 10.0000 mg | ORAL_TABLET | Freq: Every day | ORAL | Status: DC

## 2011-12-28 MED ORDER — LOSARTAN POTASSIUM 50 MG PO TABS: 50.0000 mg | ORAL_TABLET | Freq: Every day | ORAL | Status: DC

## 2011-12-28 NOTE — Telephone Encounter (Signed)
Refills have been sent in for the pt for 90 day supply.  Nothing further is needed.

## 2012-01-01 ENCOUNTER — Other Ambulatory Visit: Payer: Self-pay | Admitting: Cardiology

## 2012-01-09 DIAGNOSIS — E119 Type 2 diabetes mellitus without complications: Secondary | ICD-10-CM | POA: Diagnosis not present

## 2012-01-28 ENCOUNTER — Encounter: Payer: Self-pay | Admitting: Gastroenterology

## 2012-02-05 ENCOUNTER — Encounter: Payer: Self-pay | Admitting: *Deleted

## 2012-02-06 ENCOUNTER — Encounter: Payer: Self-pay | Admitting: Pulmonary Disease

## 2012-02-06 ENCOUNTER — Ambulatory Visit (INDEPENDENT_AMBULATORY_CARE_PROVIDER_SITE_OTHER): Payer: Medicare Other | Admitting: Pulmonary Disease

## 2012-02-06 VITALS — BP 112/72 | HR 69 | Temp 97.1°F | Ht 62.0 in | Wt 212.6 lb

## 2012-02-06 DIAGNOSIS — D126 Benign neoplasm of colon, unspecified: Secondary | ICD-10-CM

## 2012-02-06 DIAGNOSIS — IMO0001 Reserved for inherently not codable concepts without codable children: Secondary | ICD-10-CM

## 2012-02-06 DIAGNOSIS — I509 Heart failure, unspecified: Secondary | ICD-10-CM | POA: Diagnosis not present

## 2012-02-06 DIAGNOSIS — K5730 Diverticulosis of large intestine without perforation or abscess without bleeding: Secondary | ICD-10-CM

## 2012-02-06 DIAGNOSIS — E119 Type 2 diabetes mellitus without complications: Secondary | ICD-10-CM

## 2012-02-06 DIAGNOSIS — I2510 Atherosclerotic heart disease of native coronary artery without angina pectoris: Secondary | ICD-10-CM | POA: Diagnosis not present

## 2012-02-06 DIAGNOSIS — I10 Essential (primary) hypertension: Secondary | ICD-10-CM

## 2012-02-06 DIAGNOSIS — K219 Gastro-esophageal reflux disease without esophagitis: Secondary | ICD-10-CM

## 2012-02-06 DIAGNOSIS — M1990 Unspecified osteoarthritis, unspecified site: Secondary | ICD-10-CM

## 2012-02-06 DIAGNOSIS — I5032 Chronic diastolic (congestive) heart failure: Secondary | ICD-10-CM | POA: Diagnosis not present

## 2012-02-06 DIAGNOSIS — E559 Vitamin D deficiency, unspecified: Secondary | ICD-10-CM

## 2012-02-06 DIAGNOSIS — F411 Generalized anxiety disorder: Secondary | ICD-10-CM

## 2012-02-06 DIAGNOSIS — I428 Other cardiomyopathies: Secondary | ICD-10-CM

## 2012-02-06 DIAGNOSIS — E669 Obesity, unspecified: Secondary | ICD-10-CM

## 2012-02-06 DIAGNOSIS — J45909 Unspecified asthma, uncomplicated: Secondary | ICD-10-CM

## 2012-02-06 DIAGNOSIS — E785 Hyperlipidemia, unspecified: Secondary | ICD-10-CM

## 2012-02-06 DIAGNOSIS — G8929 Other chronic pain: Secondary | ICD-10-CM

## 2012-02-06 MED ORDER — ALPRAZOLAM 0.5 MG PO TABS: 0.5000 mg | ORAL_TABLET | Freq: Three times a day (TID) | ORAL | Status: DC | PRN

## 2012-02-06 MED ORDER — LIDOCAINE 5 % EX PTCH: MEDICATED_PATCH | CUTANEOUS | Status: DC

## 2012-02-06 MED ORDER — HYDROCODONE-ACETAMINOPHEN 5-500 MG PO CAPS: 1.0000 | ORAL_CAPSULE | Freq: Two times a day (BID) | ORAL | Status: DC | PRN

## 2012-02-06 MED ORDER — NEOMYCIN-POLYMYXIN-HC 3.5-10000-1 OT SOLN: OTIC | Status: DC

## 2012-02-06 NOTE — Patient Instructions (Addendum)
Today we updated your med list in our EPIC system...    Continue your current medications the same...  We wrote a new prescription for LIDODERM Patches- apply to the area of max pain & leave on for 12H, then remove & leave off for 12H...  We reviewed your recent hospital XRAYs and labs...  Call for any questions...  Stay as active as possible...  let'sd plan a recheck in 6 months w/ FASTING blood work at that time.Marland KitchenMarland Kitchen

## 2012-02-06 NOTE — Progress Notes (Signed)
Subjective:    Patient ID: [name], female    DOB: [birth year], 71 y.o.   MRN: [id number]  HPI 71 y/o BF here for a follow up visit... she has multiple medical problems as noted below...    ~  May 22, 2010:  she was seen 05/18/10 for a 63moROV & complained of left CWP that was evaluated in the ER & [place] "[place] ribs are out of whack due to the coughing" >> see prev EMR note for details... she was asked to watch for rash on her side as poss evolution of shingles diagnosis & she called today w/ c/o rash on her side> asked to come for recheck so we could document the distrib of the rash & start therapy...  However on inspection there is no rash present & when asked she said "Oh, isn't that a rash" pointing to her normal skin in her left side... no redness, no hyperesthesia, etc... she did note some itching & as time went on the itching seemed to be more or a problem...     We discussed Shingles & what it looks like> offered Atarax to take for her itching... continue other Rx.  ~  June 30, 2010:  she notes her wt is up 3# to 211# stating that she can't walk much due to her arthritis ("Xrays showed arthritis all over my body & I have to use a cane")> c/o pain but she says her left chest wall pain is improved [facility] Nabumetone & Parafon...    She sees DrStark for GI> on Omep, Asacol, Canasa, Analpram, Promethazine...    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    We follow for Resp problems> on Flonase, NEBS vs Ventolin, Singulair, Mucinex, Tussionex... and [facility] issues on Lipitor, Metformin, and Alprazolam for nerves...  Needs fasting labs today, on Lip10 now per her insurance...  ~  January 02, 2011:  644moOV & she persists w/ mult somatic complaints> requests refills of several meds today...    Asthma, Bronchiectasis, & LLL nodule on CT scan> she only  takes her breathing meds as needed including Albuterol for NEB vs Proventil MDI, & she stopped Advair on her own; prob granuloma LLL (see below)...    HBP & CAD> she saw DrKatz for Cards f/u 4/12 on Aten, Amlodip, Clonidine, Lasix, KCl, Imdur, Plavix; she had some CWP but no angina> no changes made; BP today= 130/78 and she notes some atypCP, rare palpit, occas dizzy w/o syncope, +SOB/ DOE, & intermittent edema; we decided to stop the Clonidine & substitute Losartan 5062m...    Chol> FLP looks good on Lipitor 76m53m& she is encouraged to get on a good low chol, low fat diet & get the weight down...    DM & Obesity> DM contol is adeq on Metform monotherapy but she desperately needs to lose the wt & has been totally ineffective on her prev diets...    GI> GERD, Divertics, Hx UC, Polyps> she saw DrStark 7/12 & she was stable on her Omep40mg7mor reflux, Asacol 4.8g daily & Canasa suppos prn for her UC, & f/u colon due 11/12...    GU> followed by DrWrenn as noted below w/ stress incont, hx UTIs, hx hematuria, & prob cyst left kidney which he continues to follow...    DJD, FM> managed by DrVoytek on Relafen, Lorcet, and  various other pain meds over the year...     ~  August 03, 2011:  40moROV & JToyis c/o sinusitis "blowing pieces of meat w/ blood in it", swollen glands, drainage, pain into ears, etc> we discussed Rx w/ Augmentin,Mucinex, MMW, Flonase, Saline, etc... Breathing is stable other than sinus symptoms & denies much cough, phlegm, ch in dyspnea;  Cardiac followed by DBenay Spice& Imdur incr to 629md & f/u Myoview was felt to be a neg study;  Chol stable on Lip10 but DM control worse on Metform alone & we are going to add Glimep1m19m;  She's had mod problem w/ LBP & saw Ortho w/ epidural steroid injections that have helped her pain... CXR 4/13 showed cardiomeg, calcif Ao, incr markings, no nodule seen, NAD... LABS 4/13:  FLP-at goals on Lip10;  Chems- ok x BS=151 A1c=8.2;  CBC-ok;  TSH=1.30;   BNP=42  ~  February 06, 2012:  19mo35mo & she has been hosp twice in the interval> 1) 7/28- 11/28/11 while visiting relative in ER she has a syncopal episode, believed to be vasovagal; w/u was neg- EKG, enz, monitor, 2DEcho showed DD, CDopplers, not orthostatic, K=3.2 repleted, BS=130-170 & A1c=7.1, Urine +pan-sens EColi and treated w/ Augmentin (she's allergic to cipro)...  Marland KitchenMarland Kitchen Adm 8/9 - 12/08/11 by Cards w/ CP for cath> showed patent LAD stent, mild CAD elsewhere, EF=65%; meds adjusted- see below, & Omep changed to PROTONIX40mg24mue to Plavix...    Chol looked good on Lip10 during her hosp stay...    She is not currently on Metformin (prev 500mgB41m/ the Amaryl1mgQam29msh3 doesn't know why & I presume stopped w/ contrast studies in hosp 7 never placed back on her med list at disch therefore lost; Labs today showed ==> she didn't go to lab as requested!    Her CC remains diffuse aches & pains related to her FM "it runs in the family"; she has Vicodin- allowed up to 3/d, and Relafen 750Bid per [facility]oy[facility]otes recent FM exac ppt by sitting on hard benches at a [employer] for heat, & we discussed trial LIDODERM patches...     She has a severe generalized anxiety disorder> on Xanax as needed up to 3/d...    We reviewed prob list, meds, xrays and labs> see below for updates >> she refuses the Flu shot; didn't bring med bottle or med list to office today...          Problem List:               ALLERGY (ICD-995.3) - on Saline, FLONASE 2spBid, etc...  ASTHMA (ICD-493.90) - prev on Advair but she stopped on her own> she takes MUCINEXPlaqueminesded, & NEBULIZER w/ Albut as needed & PROAIR Prn when out & about... she is an ex-smoker having quit in 1975, no recent exacerbations.  BRONCHIECTASIS (ICD-494.0) - CT Chest 3/10 showed bilat LL bronchiectasis L>R; advised to take meds regularly & avoid infections> NEBS, MUCINEX, Fluids, etc...  PULMONARY NODULE (ICD-518.89) - 1.4cm nodule in LLL  showed up on Urology CTAbd 07/12/08>  CT Chest 07/22/08 confirmed 1.4cm peripheral LLL nodule that wasn't present on CT in 2005 & no other lesions, +bronchiectasis in both LLs L>R, mod cardiomegaly...  PET Scan 4/10 showed no abn hypermetabolic activity- therefore likely scar or granuloma... ~  CXR 2/11 showed cardiomeg, some COPD, NAD- LLL nodule seen on CT 3/10 is not vis on CXR. ~  CXR 1/12 in ER showed cardiomeg,  vasc congestion, no overt edema, no nodule seen. ~  CXR 4/13 showed cardiomeg, calcif Ao, incr markings, no nodule seen, NAD...  HYPERTENSION (ICD-401.9) - controlled on ATENOLOL 33m/d, NORVASC 130md, LOSARTAN 10038m, LASIX 66m67m tabs/d (2AM, 1PM), KCL 20Bid...  ~  9/12:  BP=138/80 today, not checking BP at home... denies HAs, visual changes, palipit, dizziness, syncope, dyspnea, edema, etc... ~  4/13:  BP= 126/74 & she is relatively asymptomatic but has mult somatic complaints...  CORONARY ARTERY DISEASE (ICD-414.00) - on PLAVIX 75mg29mINTOL/ allergic to ASA) & IMDUR 60mg 66my... ~  she is s/p PTCA & stent to the LAD on 10/02... ~  last cath 2/07 w/ patent LAD stent, non-obstructive dis otherwise, EF=65%... ~  last NuclearStressTest 5/08 showed no ischemia, EF=64% (similiar to 2005)... ~  hosp in Jun09 w/ CP- cath showing non-obstructive CAD w/ 25-40% lesions in all 3 vessels, prev stent in LAD patent, norm LVF.. ~  Baseline EKG shows NSR, minor NSSTTWA, NAD... ~  Last Cards f/u 12/12> Cad, stent in LAD, c/o freq atypCPs, IMDUR increased to 60mg/d31mcouraged to incr exercise. ~  f/u MYOVIEW 12/12 showed prob normal stress nuclear study w/ mild reversible defect in anterior wall likely due to breast attenuation- cannot completely exclude ischemia.  CHEST PAIN, ATYPICAL (ICD-786.59) - she has chronic CWP, prob related to her fibromyalgia... ~  1/12:  she went to ER, then [facility] w/ left CWP- cardiac evals neg, treated w/ rest, heat, Vicodin, Tussionex, etc... subseq got shots  from [facility]PERCHOLESTEROLEMIA (ICD-272.0) - on LIPITOR 10mg/d,38miet Rx.. ~  FLP [place] TChol 132, TG 58, HDL 60, LDL 60 ~  FLP 7/09 showed TChol 116, TG 49, HDL 49, LDL 57 ~  FLP in hosp 3/10 showed TChol 130, TG 55, HDL 53, LDL 66 ~  FLP 2/11 on Cres5 showed TChol 104, TG 67, HDL 50, LDL 40 ~  Crestor 5mg chan60m to Lipitor 10mg per 22mrance co. ~  FLP 3/12 on Lip10 showed TChol 139, TG 64, HDL 57, LDL 69 ~  FLP 9/12 on Lip10 showed TChol 126, TG 79, HDL 54, LDL 57 ~  FLP 4/13 on Lip10 showed TChol 131, TG 95, HDL 52, LDL 60 ~  FLP 8/13 on Lip10 showed TChol 121, TG 68, HDL 49, LDL 58  DIABETES MELLITUS (ICD-250.00) - on METFORMIN 500mgBid, +74m carb diet... ~  last labs 11/08 showed BS=120, HgA1c=6.2 ~  labs 6/09 in hosp showed BS=110-120, HgA1c= 6.6... same medMarland KitchenMarland Kitchen needs better diet! ~  labs 3/10 in hosp showed BS= 113-155, HgA1c= 6.7 ~  labs 2/11 showed BS= 108, A1c= 7.1 ~  labs 1/12 in ER showed BS= 167... ~  labs 3/12 showed BS= 121, A1c= 7.2... continueMarland KitchenMarland Kitchename meds, better diet! ~  Labs 9/12 showed BS= 131, A1c= 6.9... ContinueMarland KitchenMarland Kitchenetform500Bid ~  Labs 4/13 showed BS= 151, A1c= 8.2... Needs moMarland KitchenMarland Kitchen meds> add GLIMEPIRIDE 1mgQam ~  158m3:  She is not currently on Metformin (prev 500mgBid w/ t31mmaryl1mgQam); she 25msn't know why & I presume stopped w/ contrast studies in hosp & never placed back on her med list at disch therefore lost; Asked to restart Metformin 500mgBid + Glim31mg/d & ROV rev21mck 3-70mo...  OBESITY 43mo-278.00)   ~  weight 11/09 = 204# ~  weight 3/10 = 208# ~  weight 2/11 = 205#... we reviewed diet + exercise program. ~  weight 1/12 = 208# ~  Weight 3/12 = 211#... We reviewed diet & exercise yet  again. ~  Weight 9/12 = 216# ~  Weight 4/13 = 217#... We reviewed diet, exercise, wt reduction program. ~  Weight 10/13 = 213#  GERD (ICD-530.81) - prev on Prilosec 21m- taking 1-2 daily but insurance won't pay- wanted Omeprazole 428md- ok; but Cards  changed to PR[place]028m due to Plavix use...  DIVERTICULOSIS OF COLON (ICD-562.10) & COLONIC POLYPS (ICD-211.3) - last colonoscopy was 7/08 w/ divertics & colitis seen, + hyperplastic polyp removed...  Hx of ULCERATIVE COLITIS (ICD-556.9) - followed by DrStark for GI- she has been on on Lialda, Rowasa, & Hydrocort enemas in the past... ~  seen 10/11 by DrStark w/ freq loose stools- Asacol incr to 4tabs Tid & CANASA suppos for 41mo34mo Her maintenance regimen is ASACOL 400mg44mabs Tid (but med list from DrStark shows 4tabsTid) and CANASA suppos as needed...  MICROSCOPIC HEMATURIA (ICD-599.72),  NEOPLASM, KIDNEY (ICD-239.5),  STRESS INCONTINENCE (ICD-788.39) - eval and Rx by DrWrenn 9/09 w/ stress incontinence, dysuria w/ microscopic hematuria-  Cysto w/ bladder bx was neg; and CT + Sonar w/ 1.8cm left upper pole renal lesion of ?etiology- poss tumor vs cyst (min enhancement, solid on sonar w/ min blood flow + 2 sm cysts also seen)...  they are following a watchful waiting protocol & repeat CT 3/10 showed stable 1.7cm category II cyst upper pole of left kidney without change... f/u planned 30yr. 40yrENERATIVE JOINT DISEASE (ICD-715.90) >> she is followed by DrVoytek et al prev on Celebrex, Parafon, Lorcet... FIBROMYALGIA (ICD-729.1) >> now on VICODIN up to Tid & RELAFEN 750mg B11mrn... LOW BACK PAIN >> prev shots from DrVoytek et al... ~  she was seen 12/11 w/ bilat foot pain & XRay showed arthritic changes- given DepoMedrol+Toradol shot IM. ~  11/12: seen by Ortho for back & leg pain> MRI showed lumbar spondylosis & mod central canal stenosis; given ESI 11/12 w/ 75% improvement in pain. ~  She reports that ESI was repeated 3/13 & further improved by her hx... ~  She identifies DrVoytek as her chronic pain doctor...  VITAMIN D DEFICIENCY (ICD-268.9) - Vit D level 2/11= 11 and pt rec to take Vit D 50000 u weekly but ?never filled this Rx... therefore rec 8/11 to switch to OTC Vit D 5000 u daily  from the health food store...  ~  Labs 3/12 showed Vit D level = 46  ANXIETY (ICD-300.00) - she has lots of stress caring for Momma... on ALPRAZOLAM 0.5mg Tid69mn...  Hx of ANEMIA (ICD-285.9) - on NUIRON 150mg/d..64m  labs 11/08 showed Hg=12.2, Fe=38/ TIBC=287/ sat=9%... ~  labs 3/10 in hosp showed Hg= 13.0, MCV= 92... ~  labs 2/11 showed Hg= 12.7, Fe= 67... ~  labs 1/12 in ER showed Hg= 11.7 ~  Labs 3/12 showed Hg= 12.3, MCV= 93 ~  Labs 9/12 showed Hg= 12.5, MCV= 94 ~  Labs 4/13 showed Hg= 12.7 ~  Labs 8/13 showed Hg= 11.5   Past Surgical History  Procedure Date  . Cholecystectomy   . Hand surgery 01/2006    Right Hand Surgery by Dr. Gramig  .Amedeo Plentyary stent placement 2002    BMS to the LAD  . Abdominal hysterectomy     Outpatient Encounter Prescriptions as of 02/06/2012  Medication Sig Dispense Refill  . acetaminophen (TYLENOL) 500 MG tablet Take 500 mg by mouth every 6 (six) hours as needed.      . albuterMarland Kitchenl (PROVENTIL) (2.5 MG/3ML) 0.083% nebulizer solution Take 2.5 mg by nebulization 3 (  three) times daily as needed.      . ALPRAZolam (XANAX) 0.5 MG tablet Take 0.5 mg by mouth 3 (three) times daily as needed. anxiety      . amLODipine (NORVASC) 10 MG tablet Take 1 tablet (10 mg total) by mouth daily.  90 tablet  3  . atenolol (TENORMIN) 50 MG tablet Take 1 tablet (50 mg total) by mouth daily.  90 tablet  3  . atorvastatin (LIPITOR) 10 MG tablet Take 1 tablet (10 mg total) by mouth daily.  90 tablet  3  . Cholecalciferol (VITAMIN D-3) 5000 UNITS TABS Take 5,000 Units by mouth daily.        . clopidogrel (PLAVIX) 75 MG tablet Take 1 tablet (75 mg total) by mouth daily.  90 tablet  3  . furosemide (LASIX) 40 MG tablet Take 40 mg by mouth 3 (three) times daily.      Marland Kitchen glimepiride (AMARYL) 1 MG tablet Take 1 mg by mouth daily before breakfast.      . hydrocodone-acetaminophen (LORCET-HD) 5-500 MG per capsule Take 1 capsule by mouth 2 (two) times daily as needed. For pain      .  hydrocortisone-pramoxine (ANALPRAM-HC) 2.5-1 % rectal cream Place rectally as needed. APPLY A SMALL AMOUNT TO AFFECTED AREA.      Marland Kitchen isosorbide mononitrate (IMDUR) 60 MG 24 hr tablet Take 1 tablet (60 mg total) by mouth daily.  90 tablet  1  . losartan (COZAAR) 50 MG tablet Take 1 tablet (50 mg total) by mouth daily.  90 tablet  1  . mesalamine (ASACOL) 400 MG EC tablet Take 1,600 mg by mouth 3 (three) times daily.      . montelukast (SINGULAIR) 10 MG tablet Take 1 tablet (10 mg total) by mouth at bedtime.  90 tablet  1  . nabumetone (RELAFEN) 750 MG tablet Take 1 tablet (750 mg total) by mouth 2 (two) times daily with a meal.  180 tablet  3  . Naftifine HCl (NAFTIN) 2 % CREA Apply topically 2 (two) times daily.      Marland Kitchen NITROSTAT 0.4 MG SL tablet 1 TABLET UNDER TONGUE AT ONSET OF CHEST PAIN YOU MAY REPEAT EVERY 5 MINUTES FOR UP TO 3 DOSES.  25 tablet  6  . pantoprazole (PROTONIX) 40 MG tablet Take 1 tablet (40 mg total) by mouth daily at 12 noon.  30 tablet  6  . potassium chloride SA (K-DUR,KLOR-CON) 20 MEQ tablet Take 20 mEq by mouth 2 (two) times daily.      . promethazine (PHENERGAN) 25 MG tablet Take 25 mg by mouth 2 (two) times daily as needed. For nausea        Allergies  Allergen Reactions  . Aspirin Nausea And Vomiting  . Azithromycin     REACTION: pt states "ZPak doesn't work"  . Ciprofloxacin Nausea And Vomiting  . Codeine Nausea And Vomiting  . Levofloxacin     REACTION: chest pain  . Oxycodone-Acetaminophen     REACTION: vomiting  . Tramadol Hcl     REACTION: pt states "spaced-out"    Current Medications, Allergies, Past Medical History, Past Surgical History, Family History, and Social History were reviewed in [employer] record.    Review of Systems         See HPI - all other systems neg except as noted...  The patient complains of weight gain, decreased hearing, chest pain, dyspnea on exertion, muscle weakness, and difficulty walking.  The  patient  denies anorexia, fever, weight loss, vision loss, hoarseness, syncope, peripheral edema, prolonged cough, headaches, hemoptysis, abdominal pain, melena, hematochezia, severe indigestion/heartburn, hematuria, incontinence, suspicious skin lesions, transient blindness, depression, unusual weight change, abnormal bleeding, enlarged lymph nodes, and angioedema.     Objective:   Physical Exam      WD, Obese, 71 y/o BF in NAD... c/o pain in her left lat chest wall> no rash present. GENERAL:  Alert & oriented; pleasant & cooperative... HEENT:  [place]/AT, EOM-wnl, PERRLA, EACs-bilat cerumen, TMs-wnl, NOSE-clear, THROAT-clear & wnl. NECK:  Supple w/ fairROM; no JVD; normal carotid impulses w/o bruits; no thyromegaly or nodules palpated; no lymphadenopathy. CHEST:  Clear to P & A; without wheezes/ rales/ or rhonchi... +trigger points along trapezius area and chest wall. HEART:  Regular Rhythm; without murmurs/ rubs/ or gallops heard... +tender to palp. ABDOMEN:  Obese, soft & nontender; normal bowel sounds; no organomegaly or masses detected. EXT: without deformities, mild arthritic changes; no varicose veins/ +venous insuffic/ tr edema. NEURO:  CN's intact;  no focal neuro deficits... DERM:  no shingles rash found...  RADIOLOGY DATA:  Reviewed in the EPIC EMR & discussed w/ the patient... CXR 4/13 showed cardiomeg, calcif Ao, incr markings, no nodule seen, NAD...  LABORATORY DATA:  Reviewed in the EPIC EMR & discussed w/ the patient... LABS 4/13:  FLP-at goals on Lip10;  Chems- ok x BS=151 A1c=8.2;  CBC-ok;  TSH=1.30;  BNP=42;     Assessment & Plan:    ASTHMA, Bronchiectasis, LLL nodule>  She refuses to take meds regularly & is encouraged to use the Nebulizer Bid, Mucinex Bid, drink plenty of fluids, etc... She will call for any breathing problems and will require OV for assessment...  HBP>  Control is adeq but her regimen but I bet compliance is poor; we discussed the need for an ACE or ARB  w/ her DM & we changed her Clonidine to Losartan- tol well, but it looks like she never stopped the Clonidine & BP/ cardiac status stable on both meds, cards aware...  CAD>  On Imdur & Plavix in addition to above BP meds; followed [facility] & felt to have mostly CWP from her FM & no signif angina; CATH 8/9 confirms nonobstructive CAD elsewhere, patent LAD stent, med Rx...  CHOL>  Stable on Lipitor 66m/d, but it would be great to lose some weight...  DM>  DM control is reasonable but ?when the Metform was stopped- I suspect 7/13 hosp w/ contrast studies & mever restarted; Labs then showed A1c=7.1; she is to restart Metform500Bid + Glimep132md...  OBESITY>  Wt up to 217# and we discussed wt reduction strategies...  GI> GERD on OMEP4010m;  Ulc colitis on Asacol & Canasa followed by DrStark...  GU followed by DrWrenn and stable as well...  DJD/ FM/ LBP>  Followed by DrVoytek on Celebrex, Parafon, and Lorcet; she is s/p 2 ESI's w/ improvement.  Vit D Defic>  She switched to OTC vit d supplement taking 5000 u daily...  Anxiety> on alprazolam Tid prn...  Anemia> stable off her Fe supplement at present...   Patient's Medications  New Prescriptions   LIDOCAINE (LIDODERM) 5 %    Use patch for 12 hours then Remove & Discard patch within 12 hours or as directed by MD   NEOMYCIN-POLYMYXIN-HYDROCORTISONE (CORTISPORIN) OTIC SOLUTION    Use 1 drop in affected ear three times daily as needed  Previous Medications   ACETAMINOPHEN (TYLENOL) 500 MG TABLET    Take 500 mg by mouth every  6 (six) hours as needed.   ALBUTEROL (PROVENTIL) (2.5 MG/3ML) 0.083% NEBULIZER SOLUTION    Take 2.5 mg by nebulization 3 (three) times daily as needed.   AMLODIPINE (NORVASC) 10 MG TABLET    Take 1 tablet (10 mg total) by mouth daily.   ATENOLOL (TENORMIN) 50 MG TABLET    Take 1 tablet (50 mg total) by mouth daily.   ATORVASTATIN (LIPITOR) 10 MG TABLET    Take 1 tablet (10 mg total) by mouth daily.   CHOLECALCIFEROL  (VITAMIN D-3) 5000 UNITS TABS    Take 5,000 Units by mouth daily.     CLOPIDOGREL (PLAVIX) 75 MG TABLET    Take 1 tablet (75 mg total) by mouth daily.   FUROSEMIDE (LASIX) 40 MG TABLET    Take 40 mg by mouth 3 (three) times daily.   GLIMEPIRIDE (AMARYL) 1 MG TABLET    Take 1 mg by mouth daily before breakfast.   HYDROCORTISONE-PRAMOXINE (ANALPRAM-HC) 2.5-1 % RECTAL CREAM    Place rectally as needed. APPLY A SMALL AMOUNT TO AFFECTED AREA.   ISOSORBIDE MONONITRATE (IMDUR) 60 MG 24 HR TABLET    Take 1 tablet (60 mg total) by mouth daily.   LOSARTAN (COZAAR) 50 MG TABLET    Take 1 tablet (50 mg total) by mouth daily.   MESALAMINE (ASACOL) 400 MG EC TABLET    Take 1,600 mg by mouth 3 (three) times daily.   MONTELUKAST (SINGULAIR) 10 MG TABLET    Take 1 tablet (10 mg total) by mouth at bedtime.   NABUMETONE (RELAFEN) 750 MG TABLET    Take 1 tablet (750 mg total) by mouth 2 (two) times daily with a meal.   NAFTIFINE HCL (NAFTIN) 2 % CREA    Apply topically 2 (two) times daily.   NITROSTAT 0.4 MG SL TABLET    1 TABLET UNDER TONGUE AT ONSET OF CHEST PAIN YOU MAY REPEAT EVERY 5 MINUTES FOR UP TO 3 DOSES.   PANTOPRAZOLE (PROTONIX) 40 MG TABLET    Take 1 tablet (40 mg total) by mouth daily at 12 noon.   POTASSIUM CHLORIDE SA (K-DUR,KLOR-CON) 20 MEQ TABLET    Take 20 mEq by mouth 2 (two) times daily.   PROMETHAZINE (PHENERGAN) 25 MG TABLET    Take 25 mg by mouth 2 (two) times daily as needed. For nausea  Modified Medications   Modified Medication Previous Medication   ALPRAZOLAM (XANAX) 0.5 MG TABLET ALPRAZolam (XANAX) 0.5 MG tablet      Take 1 tablet (0.5 mg total) by mouth 3 (three) times daily as needed. anxiety    Take 0.5 mg by mouth 3 (three) times daily as needed. anxiety   HYDROCODONE-ACETAMINOPHEN (LORCET-HD) 5-500 MG PER CAPSULE hydrocodone-acetaminophen (LORCET-HD) 5-500 MG per capsule      Take 1 capsule by mouth 2 (two) times daily as needed. For pain    Take 1 capsule by mouth 2 (two) times  daily as needed. For pain  Discontinued Medications   No medications on file

## 2012-02-07 ENCOUNTER — Ambulatory Visit: Payer: Medicare Other | Admitting: Gastroenterology

## 2012-02-07 DIAGNOSIS — M47817 Spondylosis without myelopathy or radiculopathy, lumbosacral region: Secondary | ICD-10-CM | POA: Diagnosis not present

## 2012-02-11 ENCOUNTER — Ambulatory Visit (INDEPENDENT_AMBULATORY_CARE_PROVIDER_SITE_OTHER): Payer: Medicare Other | Admitting: Gastroenterology

## 2012-02-11 ENCOUNTER — Encounter: Payer: Self-pay | Admitting: Gastroenterology

## 2012-02-11 VITALS — BP 110/70 | HR 72 | Ht 61.5 in | Wt 213.0 lb

## 2012-02-11 DIAGNOSIS — Z86010 Personal history of colonic polyps: Secondary | ICD-10-CM

## 2012-02-11 DIAGNOSIS — K5130 Ulcerative (chronic) rectosigmoiditis without complications: Secondary | ICD-10-CM

## 2012-02-11 MED ORDER — MESALAMINE 400 MG PO TBEC: 1600.0000 mg | DELAYED_RELEASE_TABLET | Freq: Three times a day (TID) | ORAL | Status: DC

## 2012-02-11 NOTE — Patient Instructions (Addendum)
We have sent the following medications to your pharmacy for you to pick up at your convenience: Asacol.  You will be due for a recall colonoscopy in 05/2012. We will send you a reminder in the mail when it gets closer to that time.  cc: Teressa Lower, MD

## 2012-02-11 NOTE — Progress Notes (Signed)
History of Present Illness: This is a 71 year-old female with left-sided ulcerative colitis who notes intermittent diarrhea. She has hemorrhoids and a history of adenomatous colon polyps as well. Her reflux symptoms are well controlled on omeprazole 40 mg daily. She has multiple comorbidities including coronary artery disease, congestive heart failure, bronchiectasis, obesity, diabetes. She was hospitalized in August for chest pain and underwent cardiac catheterization which showed stable and non-obstructive coronary disease. She has mild chronic anemia with recent hemoglobin of 11.2. He states her daughter has been ill and she would like to defer the colonoscopy for few months. Denies weight loss, abdominal pain, constipation, change in stool caliber, melena, hematochezia, nausea, vomiting, dysphagia, reflux symptoms, chest pain.  Current Medications, Allergies, Past Medical History, Past Surgical History, Family History and Social History were reviewed in [employer] record.  Physical Exam: General: Well developed , well nourished, no acute distress Head: Normocephalic and atraumatic Eyes:  sclerae anicteric, EOMI Ears: Normal auditory acuity Mouth: No deformity or lesions Lungs: Clear throughout to auscultation Heart: Regular rate and rhythm; no murmurs, rubs or bruits Abdomen: Soft, non tender and non distended. No masses, hepatosplenomegaly or hernias noted. Normal Bowel sounds Musculoskeletal: Symmetrical with no gross deformities  Pulses:  Normal pulses noted Extremities: No clubbing, cyanosis, edema or deformities noted Neurological: Alert oriented x 4, grossly nonfocal Psychological:  Alert and cooperative. Normal mood and affect  Assessment and Recommendations:  1. Left-sided ulcerative colitis. Continue Asacol 4.8 g daily. CBC and BMET  in August unremarkable except for hemoglobin of 11.2.   2. GERD. Continue omeprazole 40 mg daily and standard antireflux  measures.   3. Personal history of adenomatous colon polyps initially diagnosed November 2007. Surveillance colonoscopy was recommended in November 2012 were she is not yet scheduled. She would like to wait a few months before proceeding with colonoscopy and we will plan for a recall in February 2014.   4. Chronic anemia. Most recent hemoglobin 11.2.

## 2012-02-19 ENCOUNTER — Telehealth: Payer: Self-pay | Admitting: Pulmonary Disease

## 2012-02-19 ENCOUNTER — Other Ambulatory Visit: Payer: Self-pay | Admitting: Pulmonary Disease

## 2012-02-19 MED ORDER — GLIMEPIRIDE 1 MG PO TABS: 1.0000 mg | ORAL_TABLET | Freq: Every day | ORAL | Status: DC

## 2012-02-19 MED ORDER — METFORMIN HCL 500 MG PO TABS: 500.0000 mg | ORAL_TABLET | Freq: Two times a day (BID) | ORAL | Status: DC

## 2012-02-19 NOTE — Telephone Encounter (Signed)
Per SN---pt is to be on metformin 500 mg  1 po bid and the amaryl 1 mg every am.  Both have been sent in to her pharmacy for refills.  i have attempted to call pt but no answer and no machine.  Will try back later to make the pt aware and she will need an appt with SN in 3-4 months with recheck on her labs while on these meds.

## 2012-02-26 MED ORDER — ACCU-CHEK AVIVA PLUS W/DEVICE KIT: 1.0000 | PACK | Freq: Every day | Status: DC

## 2012-02-26 NOTE — Telephone Encounter (Signed)
Pt was in the office today with her daughter---[name]. Reviewed these instructions with the pt and she is aware that we have sent these meds to her pharmacy.  Pt voiced her understanding and nothing further is needed.

## 2012-02-28 ENCOUNTER — Telehealth: Payer: Self-pay | Admitting: Pulmonary Disease

## 2012-02-28 ENCOUNTER — Other Ambulatory Visit: Payer: Self-pay | Admitting: Pulmonary Disease

## 2012-02-28 MED ORDER — ACCU-CHEK FASTCLIX LANCETS MISC: 1.0000 | Freq: Every day | Status: DC

## 2012-02-28 MED ORDER — BD ULTRA-FINE LANCETS MISC: Status: DC

## 2012-02-28 MED ORDER — ACCU-CHEK AVIVA PLUS W/DEVICE KIT: 1.0000 | PACK | Freq: Every day | Status: DC

## 2012-02-28 MED ORDER — GLUCOSE BLOOD VI STRP: ORAL_STRIP | Status: DC

## 2012-02-28 NOTE — Telephone Encounter (Signed)
RX sent for lancets and glucose strips.

## 2012-03-03 ENCOUNTER — Other Ambulatory Visit: Payer: Self-pay | Admitting: *Deleted

## 2012-03-03 MED ORDER — GLUCOSE BLOOD VI STRP: ORAL_STRIP | Status: DC

## 2012-03-12 ENCOUNTER — Telehealth: Payer: Self-pay | Admitting: Pulmonary Disease

## 2012-03-12 MED ORDER — CEFDINIR 300 MG PO CAPS: 300.0000 mg | ORAL_CAPSULE | Freq: Two times a day (BID) | ORAL | Status: DC

## 2012-03-12 NOTE — Telephone Encounter (Signed)
Spoke with patient-she forgot to inform us that she is allergic to Doxycycline-causes unsteady gait. I have updated her allergy list and will send back to SN for advise of another Rx.

## 2012-03-12 NOTE — Telephone Encounter (Signed)
Per SN---call in omnicef 300 mg  1 po bid  #14   thanks

## 2012-03-12 NOTE — Telephone Encounter (Signed)
Rx was sent to [facility] with pt and notified that this was done.

## 2012-03-12 NOTE — Telephone Encounter (Signed)
Per SN---ok to call in doxycycline 100 mg   #20  1 po bid until gone.  thanks

## 2012-03-12 NOTE — Telephone Encounter (Signed)
i spoke with pt and she c/o cough w/ green phlem, blowing out green phlem mixed w/ stringy blood, body aches, congestin, sweats/chills (? Low grade fever), some wheezing x 2 days. No chest tx, SOB/n/v. She has been doing her neb tx's. She is requesting to have an abx called in for her. Please advise SN thanks  Last OV 02/06/12  Allergies  Allergen Reactions  . Amoxicillin   . Aspirin Nausea And Vomiting  . Azithromycin     REACTION: pt states "ZPak doesn't work"  . Ciprofloxacin Nausea And Vomiting  . Codeine Nausea And Vomiting  . Levofloxacin     REACTION: chest pain  . Oxycodone-Acetaminophen     REACTION: vomiting  . Sulfamethizole   . Tramadol Hcl     REACTION: pt states "spaced-out"

## 2012-03-14 ENCOUNTER — Other Ambulatory Visit: Payer: Self-pay | Admitting: Pulmonary Disease

## 2012-03-20 ENCOUNTER — Telehealth: Payer: Self-pay | Admitting: Pulmonary Disease

## 2012-03-20 ENCOUNTER — Other Ambulatory Visit: Payer: Self-pay | Admitting: Pulmonary Disease

## 2012-03-20 DIAGNOSIS — T7840XA Allergy, unspecified, initial encounter: Secondary | ICD-10-CM

## 2012-03-20 DIAGNOSIS — J479 Bronchiectasis, uncomplicated: Secondary | ICD-10-CM

## 2012-03-20 NOTE — Telephone Encounter (Signed)
Called and spoke with pt and she stated that she is still sick.  She stated that she finished the abx from last week and she is still having nasal congestion that at times has small amount of blood in it.  She stated that she has been using the mucinex, nasal spray but this does not seem to be helping.  Pt is going out of town next week and would like to have SN recs on how to get this cleared up.  SN please advise. Thanks  Allergies  Allergen Reactions  . Doxycycline Other (See Comments)    Unsteady gait  . Amoxicillin   . Aspirin Nausea And Vomiting  . Azithromycin     REACTION: pt states "ZPak doesn't work"  . Ciprofloxacin Nausea And Vomiting  . Codeine Nausea And Vomiting  . Levofloxacin     REACTION: chest pain  . Oxycodone-Acetaminophen     REACTION: vomiting  . Sulfamethizole   . Tramadol Hcl     REACTION: pt states "spaced-out"

## 2012-03-20 NOTE — Telephone Encounter (Signed)
Per SN---these are all nasal symptoms.   Needs eval with ENT---we can refer and see who can see her ASAP.  Order has been placed and pt is aware.  Called and spoke with pt and she is aware.

## 2012-03-21 DIAGNOSIS — M542 Cervicalgia: Secondary | ICD-10-CM | POA: Diagnosis not present

## 2012-03-21 DIAGNOSIS — R12 Heartburn: Secondary | ICD-10-CM | POA: Diagnosis not present

## 2012-03-21 DIAGNOSIS — J310 Chronic rhinitis: Secondary | ICD-10-CM | POA: Diagnosis not present

## 2012-03-21 DIAGNOSIS — J45909 Unspecified asthma, uncomplicated: Secondary | ICD-10-CM | POA: Diagnosis not present

## 2012-03-21 DIAGNOSIS — R040 Epistaxis: Secondary | ICD-10-CM | POA: Diagnosis not present

## 2012-05-05 ENCOUNTER — Other Ambulatory Visit: Payer: Self-pay | Admitting: Pulmonary Disease

## 2012-05-15 DIAGNOSIS — M1710 Unilateral primary osteoarthritis, unspecified knee: Secondary | ICD-10-CM | POA: Diagnosis not present

## 2012-05-22 ENCOUNTER — Telehealth: Payer: Self-pay | Admitting: Pulmonary Disease

## 2012-05-22 ENCOUNTER — Other Ambulatory Visit: Payer: Self-pay | Admitting: Pulmonary Disease

## 2012-05-22 MED ORDER — FLUTICASONE PROPIONATE 50 MCG/ACT NA SUSP: 2.0000 | Freq: Every day | NASAL | Status: DC

## 2012-05-22 NOTE — Telephone Encounter (Signed)
This pt was last seen in 01/2012.   This medication is not on her medication list. Would this be okay to give to her?  Please advise. Thanks!

## 2012-05-22 NOTE — Telephone Encounter (Signed)
Refill has been sent to the pharmacy for refill. Nothing further is needed.

## 2012-05-28 ENCOUNTER — Ambulatory Visit: Payer: Medicare Other | Admitting: Cardiology

## 2012-06-10 ENCOUNTER — Encounter: Payer: Self-pay | Admitting: Gastroenterology

## 2012-06-16 ENCOUNTER — Encounter: Payer: Self-pay | Admitting: Cardiology

## 2012-06-18 ENCOUNTER — Ambulatory Visit: Payer: Medicare Other | Admitting: Cardiology

## 2012-06-24 ENCOUNTER — Other Ambulatory Visit: Payer: Self-pay | Admitting: Nurse Practitioner

## 2012-06-25 ENCOUNTER — Other Ambulatory Visit: Payer: Self-pay | Admitting: Pulmonary Disease

## 2012-07-08 ENCOUNTER — Other Ambulatory Visit: Payer: Self-pay | Admitting: Nurse Practitioner

## 2012-07-08 ENCOUNTER — Other Ambulatory Visit: Payer: Self-pay | Admitting: Pulmonary Disease

## 2012-07-21 ENCOUNTER — Telehealth: Payer: Self-pay | Admitting: Pulmonary Disease

## 2012-07-21 MED ORDER — HYDROCODONE-ACETAMINOPHEN 5-325 MG PO TABS: ORAL_TABLET | ORAL | Status: DC

## 2012-07-21 NOTE — Telephone Encounter (Signed)
Called and spoke with [facility] and they are aware of new rx for the hydrocodone for this pt.  #90 with no refills called to the pharmacy.  Med list has been updated today.

## 2012-07-22 ENCOUNTER — Other Ambulatory Visit: Payer: Self-pay | Admitting: Pulmonary Disease

## 2012-07-23 ENCOUNTER — Telehealth: Payer: Self-pay | Admitting: Pulmonary Disease

## 2012-07-23 NOTE — Telephone Encounter (Signed)
Calling again in ref to previous msg can be reached at [phone].Elnita Maxwell

## 2012-07-23 NOTE — Telephone Encounter (Signed)
LMTCB I want to talk with her before calling in med, since this is not originally what she had requested

## 2012-07-23 NOTE — Telephone Encounter (Signed)
I spoke with pt and advised pt we awaiting response from SN. She voiced her understanding.

## 2012-07-23 NOTE — Telephone Encounter (Signed)
I spoke with pt. She is requesting a refill on zanaflex 95m. I do not see this on her current med list. Please advise SN thanks Last OV 02/06/12 Pending 08/06/12

## 2012-07-23 NOTE — Telephone Encounter (Signed)
Per SN---  Can call in parafon forte 500 mg  1 po tid prn muscle spasm   #90  With 2 refills. thanks

## 2012-07-24 MED ORDER — CHLORZOXAZONE 500 MG PO TABS: 500.0000 mg | ORAL_TABLET | Freq: Three times a day (TID) | ORAL | Status: DC | PRN

## 2012-07-24 NOTE — Telephone Encounter (Signed)
Pt notified that medication was sent to pharmacy.

## 2012-07-28 ENCOUNTER — Encounter: Payer: Self-pay | Admitting: Cardiology

## 2012-07-28 ENCOUNTER — Ambulatory Visit (INDEPENDENT_AMBULATORY_CARE_PROVIDER_SITE_OTHER): Payer: Medicare Other | Admitting: Cardiology

## 2012-07-28 VITALS — BP 126/78 | HR 73 | Ht 61.0 in | Wt 220.0 lb

## 2012-07-28 DIAGNOSIS — R55 Syncope and collapse: Secondary | ICD-10-CM | POA: Diagnosis not present

## 2012-07-28 DIAGNOSIS — I5032 Chronic diastolic (congestive) heart failure: Secondary | ICD-10-CM | POA: Diagnosis not present

## 2012-07-28 DIAGNOSIS — I2510 Atherosclerotic heart disease of native coronary artery without angina pectoris: Secondary | ICD-10-CM | POA: Diagnosis not present

## 2012-07-28 DIAGNOSIS — I509 Heart failure, unspecified: Secondary | ICD-10-CM

## 2012-07-28 NOTE — Patient Instructions (Addendum)
Your physician recommends that you continue on your current medications as directed. Please refer to the Current Medication list given to you today.  Your physician wants you to follow-up in: 6 months. You will receive a reminder letter in the mail two months in advance. If you don't receive a letter, please call our office to schedule the follow-up appointment.  

## 2012-07-28 NOTE — Assessment & Plan Note (Signed)
She's not had any major dizzy spells. She is stable. I spoke with her at length about her loss of her mother.

## 2012-07-28 NOTE — Assessment & Plan Note (Signed)
She has gained some weight. However I believe it is true body weight. I'm not convinced that she is significantly fluid overloaded. No change in her medications.

## 2012-07-28 NOTE — Assessment & Plan Note (Signed)
Her coronary disease is stable. Her last cath was done August, 2013. There was residual disease but intervention was not needed. Ejection fraction was 65%. Medical therapy was recommended. She's not having any significant symptoms at this time. No further workup.

## 2012-07-28 NOTE — Progress Notes (Signed)
Patient ID: [name], female   DOB: [birth year], 72 y.o.   MRN: [id number]   HPI  The patient is here for cardiology followup. She is stable. She's not having any significant chest pain. Her mother passed away and she is sad and grieving.  Allergies  Allergen Reactions  . Doxycycline Other (See Comments)    Unsteady gait  . Amoxicillin   . Aspirin Nausea And Vomiting  . Azithromycin     REACTION: pt states "ZPak doesn't work"  . Ciprofloxacin Nausea And Vomiting  . Codeine Nausea And Vomiting  . Levofloxacin     REACTION: chest pain  . Oxycodone-Acetaminophen     REACTION: vomiting  . Sulfamethizole   . Tramadol Hcl     REACTION: pt states "spaced-out"    Current Outpatient Prescriptions  Medication Sig Dispense Refill  . ACCU-CHEK FASTCLIX LANCETS MISC 1 Device by Does not apply route daily. Check blood sugar once daily  Dx   250.00  102 each  6  . acetaminophen (TYLENOL) 500 MG tablet Take 500 mg by mouth every 6 (six) hours as needed.      Marland Kitchen albuterol (PROVENTIL) (2.5 MG/3ML) 0.083% nebulizer solution Take 2.5 mg by nebulization 3 (three) times daily as needed.      . ALPRAZolam (XANAX) 0.5 MG tablet Take 1 tablet (0.5 mg total) by mouth 3 (three) times daily as needed. anxiety  90 tablet  5  . amLODipine (NORVASC) 10 MG tablet Take 1 tablet (10 mg total) by mouth daily.  90 tablet  3  . atenolol (TENORMIN) 50 MG tablet Take 1 tablet (50 mg total) by mouth daily.  90 tablet  3  . atorvastatin (LIPITOR) 10 MG tablet Take 1 tablet (10 mg total) by mouth daily.  90 tablet  3  . Blood Glucose Monitoring Suppl (ACCU-CHEK AVIVA PLUS) W/DEVICE KIT 1 Device by Does not apply route daily. Check blood sugar once daily---DX  250.00  1 kit  0  . cefdinir (OMNICEF) 300 MG capsule Take 1 capsule (300 mg total) by mouth 2 (two) times daily.  14 capsule  0  . chlorzoxazone (PARAFON FORTE DSC) 500 MG tablet Take 1 tablet (500 mg total) by mouth 3 (three) times daily as needed for muscle  spasms.  90 tablet  2  . Cholecalciferol (VITAMIN D-3) 5000 UNITS TABS Take 5,000 Units by mouth daily.        . clopidogrel (PLAVIX) 75 MG tablet Take 1 tablet (75 mg total) by mouth daily.  90 tablet  3  . fluticasone (FLONASE) 50 MCG/ACT nasal spray Place 2 sprays into the nose at bedtime.  16 g  11  . furosemide (LASIX) 40 MG tablet Take 40 mg by mouth 3 (three) times daily.      Marland Kitchen glimepiride (AMARYL) 1 MG tablet Take 1 tablet (1 mg total) by mouth daily before breakfast.  30 tablet  6  . glucose blood (ACCU-CHEK AVIVA PLUS) test strip Check blood sugar once daily or as directed  100 each  12  . HYDROcodone-acetaminophen (NORCO/VICODIN) 5-325 MG per tablet Take 1/2  To 1 tablet by mouth three times daily as needed for pain  90 tablet  0  . hydrocortisone-pramoxine (ANALPRAM-HC) 2.5-1 % rectal cream Place rectally as needed. APPLY A SMALL AMOUNT TO AFFECTED AREA.      . hydrOXYzine (ATARAX/VISTARIL) 25 MG tablet TAKE 1 TABLET BY MOUTH EVERY 4 HOURS AS NEEDED FOR ITCHING  50 tablet  2  . isosorbide  mononitrate (IMDUR) 60 MG 24 hr tablet TAKE 1 TABLET (60 MG TOTAL) BY MOUTH DAILY.  90 tablet  1  . lidocaine (LIDODERM) 5 % Use patch for 12 hours then Remove & Discard patch within 12 hours or as directed by MD  30 patch  5  . losartan (COZAAR) 50 MG tablet TAKE 1 TABLET EVERY DAY  90 tablet  1  . mesalamine (ASACOL) 400 MG EC tablet Take 4 tablets (1,600 mg total) by mouth 3 (three) times daily.  460 tablet  5  . metFORMIN (GLUCOPHAGE) 500 MG tablet Take 1 tablet (500 mg total) by mouth 2 (two) times daily with a meal.  60 tablet  6  . montelukast (SINGULAIR) 10 MG tablet TAKE 1 TABLET AT BEDTIME  90 tablet  1  . montelukast (SINGULAIR) 10 MG tablet TAKE 1 TABLET AT BEDTIME  90 tablet  1  . nabumetone (RELAFEN) 750 MG tablet Take 1 tablet (750 mg total) by mouth 2 (two) times daily with a meal.  180 tablet  3  . Naftifine HCl (NAFTIN) 2 % CREA Apply topically 2 (two) times daily.      Marland Kitchen  neomycin-polymyxin-hydrocortisone (CORTISPORIN) otic solution Use 1 drop in affected ear three times daily as needed  10 mL  0  . NITROSTAT 0.4 MG SL tablet 1 TABLET UNDER TONGUE AT ONSET OF CHEST PAIN YOU MAY REPEAT EVERY 5 MINUTES FOR UP TO 3 DOSES.  25 tablet  6  . nystatin (MYCOSTATIN) 100000 UNIT/ML suspension GARGLE & SWALLOW 1 TEASPOONFUL 4 TIMES A DAY  120 mL  3  . pantoprazole (PROTONIX) 40 MG tablet Take 1 tablet (40 mg total) by mouth daily at 12 noon.  30 tablet  6  . potassium chloride SA (K-DUR,KLOR-CON) 20 MEQ tablet Take 20 mEq by mouth 2 (two) times daily.      . promethazine (PHENERGAN) 25 MG tablet Take 25 mg by mouth 2 (two) times daily as needed. For nausea       No current facility-administered medications for this visit.    History   Social History  . Marital Status: Divorced    Spouse Name: N/A    Number of Children: N/A  . Years of Education: N/A   Occupational History  . Retired    Social History Main Topics  . Smoking status: Former Smoker -- 2.00 packs/day for 15 years    Types: Cigarettes    Quit date: 04/30/1973  . Smokeless tobacco: Never Used     Comment: ex-smoker: smoked for 10-15 years up to 2ppd, quit 1975.  Marland Kitchen Alcohol Use: No     Comment: Social drinker  . Drug Use: No  . Sexually Active: No   Other Topics Concern  . Not on file   Social History Narrative  . No narrative on file    Family History  Problem Relation Age of Onset  . Pneumonia Father   . Heart attack Father   . Parkinsonism Mother   . Sarcoidosis Brother   . Hypertension Sister   . Diabetes Sister   . Colon cancer Neg Hx     Past Medical History  Diagnosis Date  . CHF (congestive heart failure)     Diastolic  . Volume overload   . Bronchiectasis   . Hypertension   . CAD (coronary artery disease)     BMS to the LAD, 2002; cath 12/07/11 patent LAD stent and mild nonobstructive disease, EF 65%; Medical management  . Diabetes mellitus   .  Obesity   . GERD  (gastroesophageal reflux disease)   . Fibromyalgia   . Anxiety   . Allergy, unspecified not elsewhere classified   . Pulmonary nodule     Negative PET in 2010  . Other diseases of lung, not elsewhere classified   . Hypercholesterolemia   . Diverticulosis of colon   . Ulcerative colitis, left sided 2008    Hx of  . Adenomatous colon polyp 02/2006  . Microscopic hematuria   . Neoplasm of kidney   . Stress incontinence, female   . DJD (degenerative joint disease)   . Vitamin D deficiency disease   . Anemia   . Syncope   . UTI (lower urinary tract infection)   . Arthritis   . Pinched vertebral nerve     Past Surgical History  Procedure Laterality Date  . Cholecystectomy    . Hand surgery  01/2006    Right Hand Surgery by Dr. Amedeo Plenty  . Coronary stent placement  2002    BMS to the LAD  . Abdominal hysterectomy    . Cardiac catheterization  11/2011    Patient Active Problem List  Diagnosis  . COLONIC POLYPS  . NEOPLASM, KIDNEY  . DIABETES MELLITUS  . VITAMIN D DEFICIENCY  . OBESITY  . ANEMIA  . ANXIETY  . DEPRESSION  . BRONCHITIS, ACUTE  . ASTHMA  . PULMONARY NODULE  . GERD  . ULCERATIVE COLITIS  . DIVERTICULOSIS OF COLON  . MICROSCOPIC HEMATURIA  . DEGENERATIVE JOINT DISEASE  . FIBROMYALGIA  . NAUSEA AND VOMITING  . STRESS INCONTINENCE  . ALLERGY  . PERSONAL HX COLONIC POLYPS  . PRURITUS  . Hypertension  . CAD (coronary artery disease)  . Volume overload  . Bronchiectasis  . Nonischemic cardiomyopathy  . Syncope and collapse  . Ulcerative colitis  . Chronic diastolic CHF (congestive heart failure)  . Lower extremity edema  . Hypokalemia  . Hypomagnesemia  . Hematuria  . Hyperlipidemia  . Back pain, chronic    ROS    Patient denies fever, chills, headache, sweats, rash, change in vision, change in hearing, chest pain, cough, nausea vomiting, urinary symptoms. All other systems are reviewed and are negative.  PHYSICAL EXAM  Patient is overweight.  She is oriented to person time and place. Affect is normal. Lungs are clear. Respiratory effort is nonlabored. Cardiac exam reveals S1 and S2. There no clicks or significant murmurs. The abdomen is soft. There is no significant peripheral edema.  Filed Vitals:   07/28/12 1053  BP: 126/78  Pulse: 73  Height: 5' 1" (1.549 m)  Weight: 220 lb (99.791 kg)   EKG is done today and reviewed by me. There is normal sinus rhythm. There is no significant change.  ASSESSMENT & PLAN

## 2012-07-31 ENCOUNTER — Other Ambulatory Visit: Payer: Self-pay | Admitting: Pulmonary Disease

## 2012-07-31 ENCOUNTER — Telehealth: Payer: Self-pay | Admitting: Pulmonary Disease

## 2012-07-31 NOTE — Telephone Encounter (Signed)
Faxed received for chlorzoxazone requesting PA Spoke with pt advised her she would need to contact her ins company  To see what she could use to replace the chlorzoxazone . Call us back  With  Those and we would seed to SN to advise on what he wants her to take. the patient verbally understood. Nothing further needed.

## 2012-07-31 NOTE — Telephone Encounter (Signed)
I spoke with pt. tizanidine HCL 4 mg is covered. She called her insurance as advised. Per pt she was giving this before by SN 1 tab q 8 hrs PRN #90. i do not see this in pt chart.  Please advise SN thanks Last OV 02/06/12 Pending 08/06/12

## 2012-08-01 MED ORDER — TIZANIDINE HCL 4 MG PO CAPS: 4.0000 mg | ORAL_CAPSULE | Freq: Three times a day (TID) | ORAL | Status: DC | PRN

## 2012-08-01 NOTE — Telephone Encounter (Signed)
Per SN---ok to send in refill for this medication per pts request. Nothing further is needed.

## 2012-08-06 ENCOUNTER — Ambulatory Visit (INDEPENDENT_AMBULATORY_CARE_PROVIDER_SITE_OTHER): Payer: Medicare Other | Admitting: Pulmonary Disease

## 2012-08-06 ENCOUNTER — Other Ambulatory Visit (INDEPENDENT_AMBULATORY_CARE_PROVIDER_SITE_OTHER): Payer: Medicare Other

## 2012-08-06 ENCOUNTER — Encounter: Payer: Self-pay | Admitting: Pulmonary Disease

## 2012-08-06 VITALS — BP 118/80 | HR 66 | Temp 98.2°F | Ht 62.0 in | Wt 215.8 lb

## 2012-08-06 DIAGNOSIS — E119 Type 2 diabetes mellitus without complications: Secondary | ICD-10-CM

## 2012-08-06 DIAGNOSIS — E559 Vitamin D deficiency, unspecified: Secondary | ICD-10-CM | POA: Diagnosis not present

## 2012-08-06 DIAGNOSIS — I2510 Atherosclerotic heart disease of native coronary artery without angina pectoris: Secondary | ICD-10-CM | POA: Diagnosis not present

## 2012-08-06 DIAGNOSIS — K5190 Ulcerative colitis, unspecified, without complications: Secondary | ICD-10-CM | POA: Diagnosis not present

## 2012-08-06 DIAGNOSIS — J479 Bronchiectasis, uncomplicated: Secondary | ICD-10-CM

## 2012-08-06 DIAGNOSIS — E785 Hyperlipidemia, unspecified: Secondary | ICD-10-CM

## 2012-08-06 DIAGNOSIS — N39498 Other specified urinary incontinence: Secondary | ICD-10-CM

## 2012-08-06 DIAGNOSIS — T7840XA Allergy, unspecified, initial encounter: Secondary | ICD-10-CM

## 2012-08-06 DIAGNOSIS — I10 Essential (primary) hypertension: Secondary | ICD-10-CM

## 2012-08-06 DIAGNOSIS — K219 Gastro-esophageal reflux disease without esophagitis: Secondary | ICD-10-CM

## 2012-08-06 DIAGNOSIS — M1990 Unspecified osteoarthritis, unspecified site: Secondary | ICD-10-CM

## 2012-08-06 DIAGNOSIS — D649 Anemia, unspecified: Secondary | ICD-10-CM | POA: Diagnosis not present

## 2012-08-06 DIAGNOSIS — I5032 Chronic diastolic (congestive) heart failure: Secondary | ICD-10-CM

## 2012-08-06 DIAGNOSIS — E669 Obesity, unspecified: Secondary | ICD-10-CM

## 2012-08-06 DIAGNOSIS — F411 Generalized anxiety disorder: Secondary | ICD-10-CM

## 2012-08-06 DIAGNOSIS — R0609 Other forms of dyspnea: Secondary | ICD-10-CM

## 2012-08-06 DIAGNOSIS — R0989 Other specified symptoms and signs involving the circulatory and respiratory systems: Secondary | ICD-10-CM

## 2012-08-06 DIAGNOSIS — IMO0001 Reserved for inherently not codable concepts without codable children: Secondary | ICD-10-CM

## 2012-08-06 DIAGNOSIS — R0600 Dyspnea, unspecified: Secondary | ICD-10-CM

## 2012-08-06 DIAGNOSIS — J984 Other disorders of lung: Secondary | ICD-10-CM | POA: Diagnosis not present

## 2012-08-06 DIAGNOSIS — K5730 Diverticulosis of large intestine without perforation or abscess without bleeding: Secondary | ICD-10-CM

## 2012-08-06 DIAGNOSIS — J45909 Unspecified asthma, uncomplicated: Secondary | ICD-10-CM

## 2012-08-06 LAB — HEMOGLOBIN A1C: Hgb A1c MFr Bld: 7.2 % — ABNORMAL HIGH (ref 4.6–6.5)

## 2012-08-06 LAB — LIPID PANEL
LDL Cholesterol: 64 mg/dL (ref 0–99)
Total CHOL/HDL Ratio: 3

## 2012-08-06 LAB — HEPATIC FUNCTION PANEL
ALT: 10 U/L (ref 0–35)
Alkaline Phosphatase: 51 U/L (ref 39–117)
Bilirubin, Direct: 0.1 mg/dL (ref 0.0–0.3)
Total Bilirubin: 0.6 mg/dL (ref 0.3–1.2)
Total Protein: 7.7 g/dL (ref 6.0–8.3)

## 2012-08-06 LAB — MAGNESIUM: Magnesium: 1.5 mg/dL (ref 1.5–2.5)

## 2012-08-06 LAB — BASIC METABOLIC PANEL
CO2: 26 mEq/L (ref 19–32)
Calcium: 9.1 mg/dL (ref 8.4–10.5)
Chloride: 100 mEq/L (ref 96–112)
Creatinine, Ser: 0.6 mg/dL (ref 0.4–1.2)
Sodium: 139 mEq/L (ref 135–145)

## 2012-08-06 LAB — CBC WITH DIFFERENTIAL/PLATELET
Basophils Absolute: 0 10*3/uL (ref 0.0–0.1)
Basophils Relative: 0.3 % (ref 0.0–3.0)
Eosinophils Absolute: 0.4 10*3/uL (ref 0.0–0.7)
Hemoglobin: 12.3 g/dL (ref 12.0–15.0)
Lymphocytes Relative: 16.6 % (ref 12.0–46.0)
MCHC: 33.3 g/dL (ref 30.0–36.0)
MCV: 92 fl (ref 78.0–100.0)
Monocytes Absolute: 0.7 10*3/uL (ref 0.1–1.0)
Neutro Abs: 7.6 10*3/uL (ref 1.4–7.7)
RBC: 4.01 Mil/uL (ref 3.87–5.11)
RDW: 14.1 % (ref 11.5–14.6)

## 2012-08-06 LAB — BRAIN NATRIURETIC PEPTIDE: Pro B Natriuretic peptide (BNP): 36 pg/mL (ref 0.0–100.0)

## 2012-08-06 NOTE — Patient Instructions (Addendum)
Today we updated your med list in our EPIC system...    Continue your current medications the same...  Today we did your follow up FASTING blood work & CXR...    We will contact you w/ the results when available...   Keep up the good work w/ your DIET & EXERCISE program...    The goal is to lose 15-20 lbs...  Call for any questions...  Let's plan a follow up visit in 41mo sooner if needed for problems..Marland KitchenMarland Kitchen

## 2012-08-06 NOTE — Progress Notes (Signed)
Subjective:    Patient ID: [name], female    DOB: Apr 24, 1941, 72 y.o.   MRN: [id number]  HPI 72 y/o BF here for a follow up visit... she has multiple medical problems as noted below...    ~  January 02, 2011:  45moROV & she persists w/ mult somatic complaints> requests refills of several meds today...    Asthma, Bronchiectasis, & LLL nodule on CT scan> she only takes her breathing meds as needed including Albuterol for NEB vs Proventil MDI, & she stopped Advair on her own; prob granuloma LLL (see below)...    HBP & CAD> she saw DrKatz for Cards f/u 4/12 on Aten, Amlodip, Clonidine, Lasix, KCl, Imdur, Plavix; she had some CWP but no angina> no changes made; BP today= 130/78 and she notes some atypCP, rare palpit, occas dizzy w/o syncope, +SOB/ DOE, & intermittent edema; we decided to stop the Clonidine & substitute Losartan 535md...    Chol> FLP looks good on Lipitor 1071m & she is encouraged to get on a good low chol, low fat diet & get the weight down...    DM & Obesity> DM contol is adeq on Metform monotherapy but she desperately needs to lose the wt & has been totally ineffective on her prev diets...    GI> GERD, Divertics, Hx UC, Polyps> she saw DrStark 7/12 & she was stable on her Omep40m3mfor reflux, Asacol 4.8g daily & Canasa suppos prn for her UC, & f/u colon due 11/12...    GU> followed by DrWrenn as noted below w/ stress incont, hx UTIs, hx hematuria, & prob cyst left kidney which he continues to follow...    DJD, FM> managed by DrVoytek on Relafen, Lorcet, and various other pain meds over the year...     ~  August 03, 2011:  12mo 40mo& [name]/o sinusitis "blowing pieces of meat w/ blood in it", swollen glands, drainage, pain into ears, etc> we discussed Rx w/ Augmentin,Mucinex, MMW, Flonase, Saline, etc... Breathing is stable other than sinus symptoms & denies much cough, phlegm, ch in dyspnea;  Cardiac followed by DrKatBenay Spicedur incr to 60mg/67mf/u Myoview was felt to be a  neg study;  Chol stable on Lip10 but DM control worse on Metform alone & we are going to add Glimep1mg/d;59mhe's had mod problem w/ LBP & saw Ortho w/ epidural steroid injections that have helped her pain... CXR 4/13 showed cardiomeg, calcif Ao, incr markings, no nodule seen, NAD... LABS 4/13:  FLP-at goals on Lip10;  Chems- ok x BS=151 A1c=8.2;  CBC-ok;  TSH=1.30;  BNP=42  ~  February 06, 2012:  42mo ROV542mohe has been hosp twice in the interval> 1) 7/28- 11/28/11 while visiting relative in ER she has a syncopal episode, believed to be vasovagal; w/u was neg- EKG, enz, monitor, 2DEcho showed DD, CDopplers, not orthostatic, K=3.2 repleted, BS=130-170 & A1c=7.1, Urine +pan-sens EColi and treated w/ Augmentin (she's allergic to cipro)...  2) AMarland KitchenMarland Kitchen 8/9 - 12/08/11 by Cards w/ CP for cath> showed patent LAD stent, mild CAD elsewhere, EF=65%; meds adjusted- see below, & Omep changed to PROTONIX40mg/d d24mo Plavix...    Chol looked good on Lip10 during her hosp stay...    She is not currently on Metformin (prev 500mgBid w29me Amaryl1mgQam); s27mdoesn't know why & I presume stopped w/ contrast studies in hosp 7 never placed back on her med list at disch therefore lost; Labs today showed ==> she  didn't go to lab as requested!    Her CC remains diffuse aches & pains related to her FM "it runs in the family"; she has Vicodin- allowed up to 3/d, and Relafen 750Bid per [facility]; she notes recent FM exac ppt by sitting on hard benches at [employer]; she uses Aspercreme for heat, & we discussed trial LIDODERM patches...     She has a severe generalized anxiety disorder> on Xanax as needed up to 3/d...    We reviewed prob list, meds, xrays and labs> see below for updates >> she refuses the Flu shot; didn't bring med bottle or med list to office today...  ~  August 06, 2012:  67moROV & [name] devestated by the death of her mother last month; in addition she notes the pollen is bothering her early this spring...  We  reviewed the following medical problems during today's office visit >>     Allergic Rhinitis> on OTC Antihist, Singulair, Flonase & nasal saline mist; c/o the early spring pollen, we reviewed Rx...    Asthma, Bronchiectasis, & LLL nodule on CT scan> she only takes her breathing meds as needed including Albuterol for NEB vs Proventil MDI, & she stopped Advair on her own; prob granuloma LLL (no change- see below)...    HBP & CAD> on Aten50, Amlodip10, Losar50, Lasix40-3/d, K20Bid, Imdur60, Plavix75; she has some CWP but no angina> BP today= 118/78 & she notes some atypCP, rare palpit, occas dizzy w/o syncope, +SOB/ DOE, & intermittent edema...    Chol> FLP looks good on Lipitor 172md w/ TChol 126, TG 92, HDL 44, LDL 64; encouraged to lose some weight!    DM & Obesity> DM contol is adeq on Metform500Bid + Glimep1m9mm; labs 4/14 showed BS=147, A1c=7.2 & we decided same meds get wt down...    GI> GERD, Divertics, Hx UC, Polyps> she saw DrStark 10/13 & she was stable on her Omep40m63mfor reflux, Asacol 4.8g daily & Canasa suppos prn for her UC, & f/u colon overdue & she will call to set up...    GU> followed by DrWrenn as noted below w/ stress incont, hx UTIs, hx hematuria, & prob cyst left kidney which he continues to follow...    DJD, FM> managed by DrVoytek on Relafen, Lorcet, & Zanaflex + various other pain meds over the year...    Anxiety & Depression> Mother passed away 3/14March 26, 2024e takes Xanax 0.5mg 23m... We reviewed prob list, meds, xrays and labs> see below for updates >> she refuses the seasonal Flu vaccines;  Time spent 35 min... LABS 4/14:  FLP- at goals on Lip10;  Chems- ok x BS=147, A1c=7.2, K=3.4, Mg=1.5;  CBC- wnl w/ Hg=12.3, MCV=92, Fe=59 (15%sat);  TSH=1.42;  VitD=39;  BNP=36...          Problem List:               ALLERGY (ICD-995.3) - on Saline, FLONASE 2spBid, etc; she had a neg ENT eval by DrRosBarbaraann Faster3- chr rhinitis, she had a neg CT Sinus...  ASTHMA (ICD-493.90) - prev on Advair  but she stopped on her own> she takes MUCINMullica Hilleeded, & NEBULIZER w/ Albut as needed & PROAIR Prn when out & about... she is an ex-smoker having quit in 1975, no recent exacerbations.  BRONCHIECTASIS (ICD-494.0) - CT Chest 3/10 showed bilat LL bronchiectasis L>R; advised to take meds regularly & avoid infections> NEBS, MUCINEX, Fluids, etc...  PULMONARY NODULE (ICD-518.89) - 1.4cm nodule in LLL showed up  on Urology CTAbd 07/12/08>  CT Chest 07/22/08 confirmed 1.4cm peripheral LLL nodule that wasn't present on CT in 2005 & no other lesions, +bronchiectasis in both LLs L>R, mod cardiomegaly...  PET Scan 4/10 showed no abn hypermetabolic activity- therefore likely scar or granuloma... ~  CXR 2/11 showed cardiomeg, some COPD, NAD- LLL nodule seen on CT 3/10 is not vis on CXR. ~  CXR 1/12 in ER showed cardiomeg, vasc congestion, no overt edema, no nodule seen. ~  CXR 4/13 showed cardiomeg, calcif Ao, incr markings, no nodule seen, NAD.Marland Kitchen. ~  CXR 8/13 via [facility] showed borderline heart size, sl incr interstitial markings (mild edema), calcif in Ao, NAD...  HYPERTENSION (ICD-401.9) - controlled on ATENOLOL 75m/d, NORVASC 172md, LOSARTAN 10073m, LASIX 74m3m tabs/d (2AM, 1PM), KCL 20Bid...  ~  9/12:  BP=138/80 today, not checking BP at home... denies HAs, visual changes, palipit, dizziness, syncope, dyspnea, edema, etc... ~  4/13:  BP= 126/74 & she is relatively asymptomatic but has mult somatic complaints... ~  4/14:  on Aten50, Amlodip10, Losar50, Lasix40-3/d, K20Bid, Imdur60, Plavix75; she has some CWP but no angina> BP today= 118/78 & she notes some atypCP, rare palpit, occas dizzy w/o syncope, +SOB/ DOE, & intermittent edema. ~  Note> 4/14 labs showed K=3.4, Mg=1.5, & she is requested to take the K20Bid & add Magox 400/d...  CORONARY ARTERY DISEASE (ICD-414.00) - on PLAVIX 75mg52mINTOL/ allergic to ASA) & IMDUR 60mg 47my... ~  she is s/p PTCA & stent to the LAD on 10/02... ~  last cath 2/07 w/  patent LAD stent, non-obstructive dis otherwise, EF=65%... ~  last NuclearStressTest 5/08 showed no ischemia, EF=64% (similiar to 2005)... ~  hosp in Jun09 w/ CP- cath showing non-obstructive CAD w/ 25-40% lesions in all 3 vessels, prev stent in LAD patent, norm LVF.. ~  Baseline EKG shows NSR, minor NSSTTWA, NAD... ~  Last Cards f/u 12/12> Cad, stent in LAD, c/o freq atypCPs, IMDUR increased to 60mg/d66mcouraged to incr exercise. ~  f/u MYOVIEW 12/12 showed prob normal stress nuclear study w/ mild reversible defect in anterior wall likely due to breast attenuation- cannot completely exclude ischemia. ~  3/14:  She saw DrKatz for f/u CAD, DiastolicCHF, Hx syncope- she denied CP, no further dizziness; doing well on medical management, no changes made... ~  EKG 3/14 by DrKatz Benay Spice NSR, rate73, ?RAE, borderline tracing...  CHEST PAIN, ATYPICAL (ICD-786.59) - she has chronic CWP, prob related to her fibromyalgia... ~  1/12:  she went to ER, then [facility] w/ left CWP- cardiac evals neg, treated w/ rest, heat, Vicodin, Tussionex, etc... subseq [facility]CHOLESTEROLEMIA (ICD-272.0) - on LIPITOR 10mg/d,61miet Rx.. ~  FLP 11/0Hughsonhowed TChol 132, TG 58, HDL 60, LDL 60 ~  FLP 7/09 showed TChol 116, TG 49, HDL 49, LDL 57 ~  FLP in hosp 3/10 showed TChol 130, TG 55, HDL 53, LDL 66 ~  FLP 2/11 on Cres5 showed TChol 104, TG 67, HDL 50, LDL 40 ~  Crestor 5mg chan34m to Lipitor 10mg per 75mrance co. ~  FLP 3/12 on Lip10 showed TChol 139, TG 64, HDL 57, LDL 69 ~  FLP 9/12 on Lip10 showed TChol 126, TG 79, HDL 54, LDL 57 ~  FLP 4/13 on Lip10 showed TChol 131, TG 95, HDL 52, LDL 60 ~  FLP 8/13 on Lip10 showed TChol 121, TG 68, HDL 49, LDL 58  DIABETES MELLITUS (ICD-250.00) - on METFORMIN 500mgBid, +8m carb diet... ~  last labs 11/08 showed BS=120, HgA1c=6.2 ~  labs 6/09 in hosp showed BS=110-120, HgA1c= 6.6.Marland KitchenMarland Kitchen same meds, needs better diet! ~  labs 3/10 in hosp showed BS= 113-155,  HgA1c= 6.7 ~  labs 2/11 showed BS= 108, A1c= 7.1 ~  labs 1/12 in ER showed BS= 167... ~  labs 3/12 showed BS= 121, A1c= 7.2.Marland KitchenMarland Kitchen continue same meds, better diet! ~  Labs 9/12 showed BS= 131, A1c= 6.9.Marland KitchenMarland Kitchen Continue Metform500Bid ~  Labs 4/13 showed BS= 151, A1c= 8.2.Marland KitchenMarland Kitchen Needs more meds> add GLIMEPIRIDE 29mQam ~  9/13:  She had neg Optometry f/u w/ DrMcFarland- no retinopathy, early cats noted... ~  10/13:  She is not currently on Metformin (prev 5023mid w/ the Amaryl1m90mm); she doesn't know why & I presume stopped w/ contrast studies in hosp & never placed back on her med list at disch therefore lost; Asked to restart Metformin 500m55m + Glimep1mg/66m ROV revcheck 3-53mo..48moBESITY (ICD-278.00)   ~  weight 11/09 = 204# ~  weight 3/10 = 208# ~  weight 2/11 = 205#... we reviewed diet + exercise program. ~  weight 1/12 = 208# ~  Weight 3/12 = 211#... We reviewed diet & exercise yet again. ~  Weight 9/12 = 216# ~  Weight 4/13 = 217#... We reviewed diet, exercise, wt reduction program. ~  Weight 10/13 = 213# ~  Weight 4/14 = 216#  GERD (ICD-530.81) - prev on Prilosec 20mg- 4mng 1-2 daily but insurance won't pay- wanted Omeprazole 40mg/d-28m but Cards changed to PROTONIXOrientald60mo Plavix use...  DIVERTICULOSIS OF COLON (ICD-562.10) & COLONIC POLYPS (ICD-211.3) - last colonoscopy was 7/08 w/ divertics & colitis seen, + hyperplastic polyp removed... ~  1/13:  DrStark noted that she is due for f/u surveillance colon but she wanted to put it off; encouraged to call his office for this important procedure...  Hx of ULCERATIVE COLITIS (ICD-556.9) - followed by DrStark for GI- she has been on on Lialda, Rowasa, & Hydrocort enemas in the past... ~  seen 10/11 by DrStark w/ freq loose stools- Asacol incr to 4tabs Tid & CANASA suppos for 43mo. ~  H62moaintenance regimen is ASACOL 400mg- 2tab12md (but med list from DrStark shows 4tabsTid) and CANASA suppos as needed... ~  10/13:  She had a GI f/u w/  DrStark> left sided UC w/ intermitt diarrhea, hems, hx adenomatous polyp; controlled on Aswacol 4/d  MICROSCOPIC HEMATURIA (ICD-599.72),  NEOPLASM, KIDNEY (ICD-239.5),  STRESS INCONTINENCE (ICD-788.39) - eval and Rx by DrWrenn 9/09 w/ stress incontinence, dysuria w/ microscopic hematuria-  Cysto w/ bladder bx was neg; and CT + Sonar w/ 1.8cm left upper pole renal lesion of ?etiology- poss tumor vs cyst (min enhancement, solid on sonar w/ min blood flow + 2 sm cysts also seen)...  they are following a watchful waiting protocol & repeat CT 3/10 showed stable 1.7cm category II cyst upper pole of left kidney without change... f/u planned 81yr. ~  Ren15yronar 7/13 showed 2cm nodule (cyst) in upper pole of left kidney w/o change serially... ~  8/13:  She had f/u visit w/ DrWrenn> hx hemorrhagic cystitis & treated w/ Septra, she was supposed to f/u w/ them...`  DEGENERATIVE JOINT DISEASE (ICD-715.90) >> she is followed by DrVoytek et al prev on Celebrex, Parafon, Lorcet... FIBROMYALGIA (ICD-729.1) >> now on VICODIN up to Tid & RELAFEN 750mg Bid prn47mLOW BACK PAIN >> [facility]11 w/ bilat foot pain &  XRay showed arthritic changes- given DepoMedrol+Toradol shot IM. ~  11/12: seen by Ortho for back & leg pain> MRI showed lumbar spondylosis & mod central canal stenosis; given ESI 11/12 w/ 75% improvement in pain. ~  She reports that ESI was repeated 3/13 & further improved by her hx... ~  She identifies DrVoytek as her chronic pain doctor=> seen 10/13 & her note is reviewed...  VITAMIN D DEFICIENCY (ICD-268.9) - Vit D level 2/11= 11 and pt rec to take Vit D 50000 u weekly but ?never filled this Rx... therefore rec 8/11 to switch to OTC Vit D 5000 u daily from the health food store...  ~  Labs 3/12 showed Vit D level = 46 ~  Labs 4/14 showed Vit D levewl = 39 & she is rec to take the 5000u OTC Vit D supplement every day...  ANXIETY (ICD-300.00) - she has lots of  stress caring for Momma... on ALPRAZOLAM 0.92m Tid Prn...  Hx of ANEMIA (ICD-285.9) - on NUIRON 1524md...  ~  labs 11/08 showed Hg=12.2, Fe=38/ TIBC=287/ sat=9%... ~  labs 3/10 in hosp showed Hg= 13.0, MCV= 92... ~  labs 2/11 showed Hg= 12.7, Fe= 67... ~  labs 1/12 in ER showed Hg= 11.7 ~  Labs 3/12 showed Hg= 12.3, MCV= 93 ~  Labs 9/12 showed Hg= 12.5, MCV= 94 ~  Labs 4/13 showed Hg= 12.7 ~  Labs 8/13 showed Hg= 11.5 ~  laqbs 4/14 showed Hg= 12.3, MCV=92, Fe=59 (15%sat) & rec to continue daily iron supplement...   Past Surgical History  Procedure Laterality Date  . Cholecystectomy    . Hand surgery  01/2006    Right Hand Surgery by Dr. GrAmedeo Plenty. Coronary stent placement  2002    BMS to the LAD  . Abdominal hysterectomy    . Cardiac catheterization  11/2011    Outpatient Encounter Prescriptions as of 08/06/2012  Medication Sig Dispense Refill  . ACCU-CHEK FASTCLIX LANCETS MISC 1 Device by Does not apply route daily. Check blood sugar once daily  Dx   250.00  102 each  6  . acetaminophen (TYLENOL) 500 MG tablet Take 500 mg by mouth every 6 (six) hours as needed.      . Marland Kitchenlbuterol (PROVENTIL) (2.5 MG/3ML) 0.083% nebulizer solution Take 2.5 mg by nebulization 3 (three) times daily as needed.      . ALPRAZolam (XANAX) 0.5 MG tablet Take 1 tablet (0.5 mg total) by mouth 3 (three) times daily as needed. anxiety  90 tablet  5  . amLODipine (NORVASC) 10 MG tablet Take 1 tablet (10 mg total) by mouth daily.  90 tablet  3  . atenolol (TENORMIN) 50 MG tablet Take 1 tablet (50 mg total) by mouth daily.  90 tablet  3  . atorvastatin (LIPITOR) 10 MG tablet Take 1 tablet (10 mg total) by mouth daily.  90 tablet  3  . Blood Glucose Monitoring Suppl (ACCU-CHEK AVIVA PLUS) W/DEVICE KIT 1 Device by Does not apply route daily. Check blood sugar once daily---DX  250.00  1 kit  0  . cefdinir (OMNICEF) 300 MG capsule Take 1 capsule (300 mg total) by mouth 2 (two) times daily.  14 capsule  0  .  chlorzoxazone (PARAFON FORTE DSC) 500 MG tablet Take 1 tablet (500 mg total) by mouth 3 (three) times daily as needed for muscle spasms.  90 tablet  2  . Cholecalciferol (VITAMIN D-3) 5000 UNITS TABS Take 5,000 Units by mouth daily.        .Marland Kitchen  clopidogrel (PLAVIX) 75 MG tablet Take 1 tablet (75 mg total) by mouth daily.  90 tablet  3  . fluticasone (FLONASE) 50 MCG/ACT nasal spray Place 2 sprays into the nose at bedtime.  16 g  11  . furosemide (LASIX) 40 MG tablet Take 40 mg by mouth 3 (three) times daily.      Marland Kitchen glimepiride (AMARYL) 1 MG tablet Take 1 tablet (1 mg total) by mouth daily before breakfast.  30 tablet  6  . glucose blood (ACCU-CHEK AVIVA PLUS) test strip Check blood sugar once daily or as directed  100 each  12  . HYDROcodone-acetaminophen (NORCO/VICODIN) 5-325 MG per tablet Take 1/2  To 1 tablet by mouth three times daily as needed for pain  90 tablet  0  . hydrocortisone-pramoxine (ANALPRAM-HC) 2.5-1 % rectal cream Place rectally as needed. APPLY A SMALL AMOUNT TO AFFECTED AREA.      . hydrOXYzine (ATARAX/VISTARIL) 25 MG tablet TAKE 1 TABLET BY MOUTH EVERY 4 HOURS AS NEEDED FOR ITCHING  50 tablet  2  . isosorbide mononitrate (IMDUR) 60 MG 24 hr tablet TAKE 1 TABLET (60 MG TOTAL) BY MOUTH DAILY.  90 tablet  1  . lidocaine (LIDODERM) 5 % Use patch for 12 hours then Remove & Discard patch within 12 hours or as directed by MD  30 patch  5  . losartan (COZAAR) 50 MG tablet TAKE 1 TABLET EVERY DAY  90 tablet  1  . mesalamine (ASACOL) 400 MG EC tablet Take 4 tablets (1,600 mg total) by mouth 3 (three) times daily.  460 tablet  5  . metFORMIN (GLUCOPHAGE) 500 MG tablet Take 1 tablet (500 mg total) by mouth 2 (two) times daily with a meal.  60 tablet  6  . montelukast (SINGULAIR) 10 MG tablet TAKE 1 TABLET AT BEDTIME  90 tablet  1  . montelukast (SINGULAIR) 10 MG tablet TAKE 1 TABLET AT BEDTIME  90 tablet  1  . nabumetone (RELAFEN) 750 MG tablet Take 1 tablet (750 mg total) by mouth 2 (two)  times daily with a meal.  180 tablet  3  . Naftifine HCl (NAFTIN) 2 % CREA Apply topically 2 (two) times daily.      Marland Kitchen neomycin-polymyxin-hydrocortisone (CORTISPORIN) otic solution Use 1 drop in affected ear three times daily as needed  10 mL  0  . NITROSTAT 0.4 MG SL tablet 1 TABLET UNDER TONGUE AT ONSET OF CHEST PAIN YOU MAY REPEAT EVERY 5 MINUTES FOR UP TO 3 DOSES.  25 tablet  6  . nystatin (MYCOSTATIN) 100000 UNIT/ML suspension GARGLE & SWALLOW 1 TEASPOONFUL 4 TIMES A DAY  120 mL  3  . pantoprazole (PROTONIX) 40 MG tablet Take 1 tablet (40 mg total) by mouth daily at 12 noon.  30 tablet  6  . potassium chloride SA (K-DUR,KLOR-CON) 20 MEQ tablet Take 20 mEq by mouth 2 (two) times daily.      . promethazine (PHENERGAN) 25 MG tablet Take 25 mg by mouth 2 (two) times daily as needed. For nausea      . tiZANidine (ZANAFLEX) 4 MG capsule Take 1 capsule (4 mg total) by mouth 3 (three) times daily as needed for muscle spasms.  90 capsule  1   No facility-administered encounter medications on file as of 08/06/2012.    Allergies  Allergen Reactions  . Doxycycline Other (See Comments)    Unsteady gait  . Amoxicillin   . Aspirin Nausea And Vomiting  . Azithromycin  REACTION: pt states "ZPak doesn't work"  . Ciprofloxacin Nausea And Vomiting  . Codeine Nausea And Vomiting  . Levofloxacin     REACTION: chest pain  . Oxycodone-Acetaminophen     REACTION: vomiting  . Sulfamethizole   . Tramadol Hcl     REACTION: pt states "spaced-out"    Current Medications, Allergies, Past Medical History, Past Surgical History, Family History, and Social History were reviewed in [employer] record.    Review of Systems         See HPI - all other systems neg except as noted...  The patient complains of weight gain, decreased hearing, chest pain, dyspnea on exertion, muscle weakness, and difficulty walking.  The patient denies anorexia, fever, weight loss, vision loss, hoarseness,  syncope, peripheral edema, prolonged cough, headaches, hemoptysis, abdominal pain, melena, hematochezia, severe indigestion/heartburn, hematuria, incontinence, suspicious skin lesions, transient blindness, depression, unusual weight change, abnormal bleeding, enlarged lymph nodes, and angioedema.     Objective:   Physical Exam      WD, Obese, 72 y/o BF in NAD... c/o pain in her left lat chest wall> no rash present. GENERAL:  Alert & oriented; pleasant & cooperative... HEENT:  [place]/AT, EOM-wnl, PERRLA, EACs-bilat cerumen, TMs-wnl, NOSE-clear, THROAT-clear & wnl. NECK:  Supple w/ fairROM; no JVD; normal carotid impulses w/o bruits; no thyromegaly or nodules palpated; no lymphadenopathy. CHEST:  Clear to P & A; without wheezes/ rales/ or rhonchi... +trigger points along trapezius area and chest wall. HEART:  Regular Rhythm; without murmurs/ rubs/ or gallops heard... +tender to palp. ABDOMEN:  Obese, soft & nontender; normal bowel sounds; no organomegaly or masses detected. EXT: without deformities, mild arthritic changes; no varicose veins/ +venous insuffic/ tr edema. NEURO:  CN's intact;  no focal neuro deficits... DERM:  no shingles rash found...  RADIOLOGY DATA:  Reviewed in the EPIC EMR & discussed w/ the patient... CXR 4/13 showed cardiomeg, calcif Ao, incr markings, no nodule seen, NAD...  LABORATORY DATA:  Reviewed in the EPIC EMR & discussed w/ the patient... LABS 4/13:  FLP-at goals on Lip10;  Chems- ok x BS=151 A1c=8.2;  CBC-ok;  TSH=1.30;  BNP=42;     Assessment & Plan:    Allergic Rhinitis>  She is on max meds for her chr rhinitis & has been eval by [name] w/ neg CT Sinus...  ASTHMA, Bronchiectasis, LLL nodule>  She refuses to take meds regularly & is encouraged to use the Nebulizer Bid, Mucinex Bid, drink plenty of fluids, etc... She will call for any breathing problems and will require OV for assessment...  HBP>  Control is adeq but her regimen but I bet compliance is poor;  encouraged to take all meds regularly & bring all med bottles to the OV for review...  CAD>  On Imdur & Plavix in addition to above BP meds; followed by [facility] & felt to have mostly CWP from her FM & no signif angina; CATH confirmed nonobstructive CAD elsewhere, patent LAD stent, med Rx...  CHOL>  Stable on Lipitor 19m/d, but it would be great to lose some weight...  DM>  DM control is reasonable on Metform & Glimep; Labs then showed A1c=7.2 & advised to lose wt...  OBESITY>  Wt up to 216# and we discussed wt reduction strategies...  GI> GERD on OMEP455md;  Ulc colitis on Asacol & Canasa followed by DrStark...  GU> followed by DrWrenn and stable as well...  DJD/ FM/ LBP>  Followed by DrVoytek on Celebrex, Parafon, and Lorcet; she is  s/p 2 ESI's w/ improvement.  Vit D Defic>  She switched to OTC vit d supplement taking 5000 u daily...  Anxiety> on alprazolam Tid prn...  Anemia> stable off her Fe supplement at present...   Patient's Medications  New Prescriptions   No medications on file  Previous Medications   ACCU-CHEK FASTCLIX LANCETS MISC    1 Device by Does not apply route daily. Check blood sugar once daily  Dx   250.00   ACETAMINOPHEN (TYLENOL) 500 MG TABLET    Take 500 mg by mouth every 6 (six) hours as needed.   ALBUTEROL (PROVENTIL) (2.5 MG/3ML) 0.083% NEBULIZER SOLUTION    Take 2.5 mg by nebulization 3 (three) times daily as needed.   ALPRAZOLAM (XANAX) 0.5 MG TABLET    Take 1 tablet (0.5 mg total) by mouth 3 (three) times daily as needed. anxiety   AMLODIPINE (NORVASC) 10 MG TABLET    Take 1 tablet (10 mg total) by mouth daily.   ATENOLOL (TENORMIN) 50 MG TABLET    Take 1 tablet (50 mg total) by mouth daily.   ATORVASTATIN (LIPITOR) 10 MG TABLET    Take 1 tablet (10 mg total) by mouth daily.   BLOOD GLUCOSE MONITORING SUPPL (ACCU-CHEK AVIVA PLUS) W/DEVICE KIT    1 Device by Does not apply route daily. Check blood sugar once daily---DX  250.00   CHOLECALCIFEROL (VITAMIN  D-3) 5000 UNITS TABS    Take 5,000 Units by mouth daily.     CLOPIDOGREL (PLAVIX) 75 MG TABLET    Take 1 tablet (75 mg total) by mouth daily.   FLUTICASONE (FLONASE) 50 MCG/ACT NASAL SPRAY    Place 2 sprays into the nose at bedtime.   FUROSEMIDE (LASIX) 40 MG TABLET    Take 40 mg by mouth 3 (three) times daily.   GLIMEPIRIDE (AMARYL) 1 MG TABLET    Take 1 tablet (1 mg total) by mouth daily before breakfast.   GLUCOSE BLOOD (ACCU-CHEK AVIVA PLUS) TEST STRIP    Check blood sugar once daily or as directed   HYDROCODONE-ACETAMINOPHEN (NORCO/VICODIN) 5-325 MG PER TABLET    Take 1/2  To 1 tablet by mouth three times daily as needed for pain   HYDROCORTISONE-PRAMOXINE (ANALPRAM-HC) 2.5-1 % RECTAL CREAM    Place rectally as needed. APPLY A SMALL AMOUNT TO AFFECTED AREA.   HYDROXYZINE (ATARAX/VISTARIL) 25 MG TABLET    TAKE 1 TABLET BY MOUTH EVERY 4 HOURS AS NEEDED FOR ITCHING   ISOSORBIDE MONONITRATE (IMDUR) 60 MG 24 HR TABLET    TAKE 1 TABLET (60 MG TOTAL) BY MOUTH DAILY.   LIDOCAINE (LIDODERM) 5 %    Use patch for 12 hours then Remove & Discard patch within 12 hours or as directed by MD   LOSARTAN (COZAAR) 50 MG TABLET    TAKE 1 TABLET EVERY DAY   MESALAMINE (ASACOL) 400 MG EC TABLET    Take 4 tablets (1,600 mg total) by mouth 3 (three) times daily.   METFORMIN (GLUCOPHAGE) 500 MG TABLET    Take 1 tablet (500 mg total) by mouth 2 (two) times daily with a meal.   MONTELUKAST (SINGULAIR) 10 MG TABLET    TAKE 1 TABLET AT BEDTIME   NABUMETONE (RELAFEN) 750 MG TABLET    Take 1 tablet (750 mg total) by mouth 2 (two) times daily with a meal.   NAFTIFINE HCL (NAFTIN) 2 % CREA    Apply topically 2 (two) times daily.   NEOMYCIN-POLYMYXIN-HYDROCORTISONE (CORTISPORIN) OTIC SOLUTION  Use 1 drop in affected ear three times daily as needed   NITROSTAT 0.4 MG SL TABLET    1 TABLET UNDER TONGUE AT ONSET OF CHEST PAIN YOU MAY REPEAT EVERY 5 MINUTES FOR UP TO 3 DOSES.   NYSTATIN (MYCOSTATIN) 100000 UNIT/ML SUSPENSION     GARGLE & SWALLOW 1 TEASPOONFUL 4 TIMES A DAY   PANTOPRAZOLE (PROTONIX) 40 MG TABLET    Take 1 tablet (40 mg total) by mouth daily at 12 noon.   POTASSIUM CHLORIDE SA (K-DUR,KLOR-CON) 20 MEQ TABLET    Take 20 mEq by mouth 2 (two) times daily.   PROMETHAZINE (PHENERGAN) 25 MG TABLET    Take 25 mg by mouth 2 (two) times daily as needed. For nausea   TIZANIDINE (ZANAFLEX) 4 MG CAPSULE    Take 1 capsule (4 mg total) by mouth 3 (three) times daily as needed for muscle spasms.  Modified Medications   No medications on file  Discontinued Medications   CEFDINIR (OMNICEF) 300 MG CAPSULE    Take 1 capsule (300 mg total) by mouth 2 (two) times daily.   CHLORZOXAZONE (PARAFON FORTE DSC) 500 MG TABLET    Take 1 tablet (500 mg total) by mouth 3 (three) times daily as needed for muscle spasms.   MONTELUKAST (SINGULAIR) 10 MG TABLET    TAKE 1 TABLET AT BEDTIME

## 2012-08-10 ENCOUNTER — Encounter: Payer: Self-pay | Admitting: Pulmonary Disease

## 2012-08-11 ENCOUNTER — Other Ambulatory Visit: Payer: Self-pay | Admitting: Pulmonary Disease

## 2012-08-11 MED ORDER — POTASSIUM CHLORIDE CRYS ER 20 MEQ PO TBCR: 20.0000 meq | EXTENDED_RELEASE_TABLET | Freq: Two times a day (BID) | ORAL | Status: DC

## 2012-08-11 MED ORDER — MAGNESIUM OXIDE 400 MG PO TABS: 400.0000 mg | ORAL_TABLET | Freq: Every day | ORAL | Status: DC

## 2012-08-11 NOTE — Telephone Encounter (Signed)
Refills sent in to the pharmacy.  Pt is aware.

## 2012-08-12 ENCOUNTER — Telehealth: Payer: Self-pay | Admitting: Pulmonary Disease

## 2012-08-12 NOTE — Telephone Encounter (Signed)
Per Pharmacy pt needs a PA   Chlorzoxazone 583m Take 1 tablet 3 times a day prn muscle spasm.  Called and spoke with jenny at [phone] start PA  Will know answer in 72 hours.  Dx 729.1 Well send to [facility] to watch out for approval .

## 2012-08-13 ENCOUNTER — Telehealth: Payer: Self-pay | Admitting: Pulmonary Disease

## 2012-08-13 NOTE — Telephone Encounter (Signed)
I spoke with [name] from [facility] and was advised her was able to get pt paperwork processed already. He needed nothing further from [place].

## 2012-08-18 NOTE — Telephone Encounter (Signed)
Per SN---ok to change med to tizanidine 4 mg  #90  1 po tid   Called and spoke with pt and she stated that this med was called in for her on 4/4.  Pt stated that she has 1 further refill. Med list has been updated.

## 2012-08-18 NOTE — Telephone Encounter (Signed)
PA for the chlorzoxazone has been denied by the insurance company due to this medication not covered by this plan.  SN please advise of any other medication that we can use as replacement.  thanks

## 2012-08-23 ENCOUNTER — Other Ambulatory Visit: Payer: Self-pay | Admitting: Pulmonary Disease

## 2012-09-15 ENCOUNTER — Telehealth: Payer: Self-pay | Admitting: Pulmonary Disease

## 2012-09-15 MED ORDER — CEFDINIR 300 MG PO CAPS: 300.0000 mg | ORAL_CAPSULE | Freq: Two times a day (BID) | ORAL | Status: DC

## 2012-09-15 NOTE — Telephone Encounter (Signed)
Per SN---   Call in cefdinir 329m  #14  1 po bid.  thanks

## 2012-09-15 NOTE — Telephone Encounter (Signed)
Pt aware and rx sent. [place] Bing, CMA

## 2012-09-15 NOTE — Telephone Encounter (Signed)
LAst OV 08-06-12. Pt states 1 week ago she started having nasal drainage that was clear, sneezing, sore throat. Pt states these symptoms have improved but now she feels the congestion in her chest, dry cough, SOB, chest tightness, wheezing, ans some sore throat. Pt states she is having to use her albuterol neb more without much relief. Pt is requesting and abx, states Dr. Lenna Gilford gave her cefdinir last time and this helped.  Please advise. [place] Bing, CMA Allergies  Allergen Reactions  . Doxycycline Other (See Comments)    Unsteady gait  . Amoxicillin   . Aspirin Nausea And Vomiting  . Azithromycin     REACTION: pt states "ZPak doesn't work"  . Ciprofloxacin Nausea And Vomiting  . Codeine Nausea And Vomiting  . Levofloxacin     REACTION: chest pain  . Oxycodone-Acetaminophen     REACTION: vomiting  . Sulfamethizole   . Tramadol Hcl     REACTION: pt states "spaced-out"

## 2012-09-26 DIAGNOSIS — M1710 Unilateral primary osteoarthritis, unspecified knee: Secondary | ICD-10-CM | POA: Diagnosis not present

## 2012-10-01 ENCOUNTER — Other Ambulatory Visit: Payer: Self-pay | Admitting: Pulmonary Disease

## 2012-10-03 ENCOUNTER — Telehealth: Payer: Self-pay | Admitting: Pulmonary Disease

## 2012-10-03 NOTE — Telephone Encounter (Signed)
rx have been called and left on the machine at the pharmacy.  Nothing further is needed.

## 2012-10-17 ENCOUNTER — Other Ambulatory Visit: Payer: Self-pay | Admitting: Nurse Practitioner

## 2012-10-21 DIAGNOSIS — M76899 Other specified enthesopathies of unspecified lower limb, excluding foot: Secondary | ICD-10-CM | POA: Diagnosis not present

## 2012-10-24 ENCOUNTER — Other Ambulatory Visit: Payer: Self-pay | Admitting: Pulmonary Disease

## 2012-10-24 ENCOUNTER — Encounter: Payer: Self-pay | Admitting: Physician Assistant

## 2012-10-28 ENCOUNTER — Telehealth: Payer: Self-pay | Admitting: Physician Assistant

## 2012-10-28 ENCOUNTER — Encounter: Payer: Self-pay | Admitting: Physician Assistant

## 2012-10-28 ENCOUNTER — Ambulatory Visit: Payer: Medicare Other | Admitting: Physician Assistant

## 2012-10-28 ENCOUNTER — Ambulatory Visit (INDEPENDENT_AMBULATORY_CARE_PROVIDER_SITE_OTHER): Payer: Medicare Other | Admitting: Physician Assistant

## 2012-10-28 VITALS — BP 120/70 | HR 72 | Ht 62.0 in | Wt 217.2 lb

## 2012-10-28 DIAGNOSIS — Z7901 Long term (current) use of anticoagulants: Secondary | ICD-10-CM | POA: Diagnosis not present

## 2012-10-28 DIAGNOSIS — K5190 Ulcerative colitis, unspecified, without complications: Secondary | ICD-10-CM | POA: Diagnosis not present

## 2012-10-28 DIAGNOSIS — Z86010 Personal history of colonic polyps: Secondary | ICD-10-CM | POA: Diagnosis not present

## 2012-10-28 MED ORDER — BUDESONIDE 9 MG PO TB24: 9.0000 mg | ORAL_TABLET | Freq: Every day | ORAL | Status: DC

## 2012-10-28 MED ORDER — MESALAMINE 1.2 G PO TBEC: DELAYED_RELEASE_TABLET | ORAL | Status: DC

## 2012-10-28 MED ORDER — MOVIPREP 100 G PO SOLR: 1.0000 | Freq: Once | ORAL | Status: DC

## 2012-10-28 MED ORDER — MESALAMINE 4 G RE ENEM: 4.0000 g | ENEMA | Freq: Every day | RECTAL | Status: DC

## 2012-10-28 MED ORDER — HYDROCORTISONE 2.5 % RE CREA: TOPICAL_CREAM | RECTAL | Status: DC

## 2012-10-28 NOTE — Telephone Encounter (Signed)
Called the patient to advise her she can hold the Plavix for 5 days prior to the colonoscopy, Stop it on 11-07-2012 and resume it on 11-13-2012.  I told her we will give her more samples of the Uceris  When she is close to running out.  [name] wants her to take it for about a month.

## 2012-10-28 NOTE — Patient Instructions (Addendum)
We sent prescriptions to [facility] 1. Rowasa enemas 2. Moviprep- colonoscopy prep 3. Lialda 4. Uceris 5. Anusol HC Cream You have been scheduled for a colonoscopy with propofol. Please follow written instructions given to you at your visit today.  Please pick up your prep kit at the pharmacy within the next 1-3 days. If you use inhalers (even only as needed), please bring them with you on the day of your procedure.

## 2012-10-28 NOTE — Telephone Encounter (Signed)
Message copied by Tonette Bihari on Tue Oct 28, 2012 11:14 AM ------      Message from: Dola Argyle D      Created: Tue Oct 28, 2012 10:58 AM      Regarding: RE: Colonoscopy clearance-Plavix       Plavix can be held 5 days for GI PROCEDURE      ----- Message -----         From: Tonette Bihari, CMA         Sent: 10/28/2012  10:07 AM           To: Carlena Bjornstad, MD      Subject: Colonoscopy clearance-Plavix                             10/28/2012                        RE: [name]      DOB: November 08, 1940      MRN: [id number]                  Dear Dr. Dola Argyle,                   We have scheduled the above patient for an endoscopic procedure. Our records show that she is on anticoagulation therapy.             Please advise as to how long the patient may come off her therapy of Plavix prior to the procedure, which is scheduled for 11-12-2012.            Please fax back/ or route the completed form to [facility] at [phone].             Sincerely,                        Amy Esterwood PA-C                        Marisue Humble CMA              ------

## 2012-10-28 NOTE — Progress Notes (Signed)
Subjective:    Patient ID: [name], female    DOB: 09/08/1940, 72 y.o.   MRN: [id number]  HPI  [name] is a pleasant 72 year old Afro-American female known to Dr. Fuller Plan. She has history of left-sided ulcerative colitis and adenomatous colon polyps. Her colitis had been in remission over the past year or so with use of a is a call or delta call. She had undergone upper endoscopy in 2010 with finding of small gastric polyps which were biopsied and are hyperplastic. She had flexible sigmoidoscopy in 2008 showing a mildly active left-sided ulcerative colitis and one 3 mm hyperplastic polyp was removed. Full colonoscopy was done in 2007 at that time with no active colitis and one tubular adenomatous polyp removed. Comes in today to discuss followup colonoscopy and also stating that she's had been having a flare of her colitis she has been taking 12 Delzicol tablets per day . She  says the tablets are  difficult to swallow , and seem to stick together and in her throat. She says over the past 2-3 weeks she has had an increase in frequency of bowel movements with looser stools and is having 5-7 bowel movements per day . Her appetite has been fine. She has seen occasional small amounts of bright red blood.  She is maintained on Plavix with history of coronary artery disease status post bare-metal stent to the LAD in 2002. She had repeat catheter in August of 2013 showing stable coronary disease. Last EF estimated at 65%. Other medical problems include obesity, fibromyalgia, congestive heart failure ,   diabetes mellitus, and hypertension     Review of Systems  Constitutional: Negative.   HENT: Negative.   Eyes: Negative.   Respiratory: Negative.   Cardiovascular: Negative.   Gastrointestinal: Positive for diarrhea and blood in stool.  Endocrine: Negative.   Genitourinary: Negative.   Musculoskeletal: Negative.   Skin: Negative.   Allergic/Immunologic: Negative.   Neurological: Negative.    Hematological: Negative.   Psychiatric/Behavioral: Negative.    Outpatient Prescriptions Prior to Visit  Medication Sig Dispense Refill  . acetaminophen (TYLENOL) 500 MG tablet Take 500 mg by mouth every 6 (six) hours as needed.      Marland Kitchen albuterol (PROVENTIL) (2.5 MG/3ML) 0.083% nebulizer solution Take 2.5 mg by nebulization 3 (three) times daily as needed.      . ALPRAZolam (XANAX) 0.5 MG tablet Take 1 tablet (0.5 mg total) by mouth 3 (three) times daily as needed. anxiety  90 tablet  5  . ALPRAZolam (XANAX) 0.5 MG tablet TAKE 1 TABLET 3 TIMES A DAY AS NEEDED  90 tablet  5  . amLODipine (NORVASC) 10 MG tablet Take 1 tablet (10 mg total) by mouth daily.  90 tablet  3  . atenolol (TENORMIN) 50 MG tablet Take 1 tablet (50 mg total) by mouth daily.  90 tablet  3  . atorvastatin (LIPITOR) 10 MG tablet Take 1 tablet (10 mg total) by mouth daily.  90 tablet  3  . Blood Glucose Monitoring Suppl (ACCU-CHEK AVIVA PLUS) W/DEVICE KIT 1 Device by Does not apply route daily. Check blood sugar once daily---DX  250.00  1 kit  0  . cefdinir (OMNICEF) 300 MG capsule Take 1 capsule (300 mg total) by mouth 2 (two) times daily.  14 capsule  0  . Cholecalciferol (VITAMIN D-3) 5000 UNITS TABS Take 5,000 Units by mouth daily.        . clopidogrel (PLAVIX) 75 MG tablet Take 1 tablet (75 mg total)  by mouth daily.  90 tablet  3  . fluticasone (FLONASE) 50 MCG/ACT nasal spray Place 2 sprays into the nose at bedtime.  16 g  11  . furosemide (LASIX) 40 MG tablet Take 40 mg by mouth 3 (three) times daily.      Marland Kitchen glimepiride (AMARYL) 1 MG tablet Take 1 tablet (1 mg total) by mouth daily before breakfast.  30 tablet  6  . glucose blood (ACCU-CHEK AVIVA PLUS) test strip Check blood sugar once daily or as directed  100 each  12  . HYDROcodone-acetaminophen (NORCO/VICODIN) 5-325 MG per tablet TAKE 1/2 TO 1 TABLET THREE TIMES A DAY AS NEEDED FOR PAIN  90 tablet  5  . hydrocortisone-pramoxine (ANALPRAM-HC) 2.5-1 % rectal cream  Place rectally as needed. APPLY A SMALL AMOUNT TO AFFECTED AREA.      . hydrOXYzine (ATARAX/VISTARIL) 25 MG tablet TAKE 1 TABLET BY MOUTH EVERY 4 HOURS AS NEEDED FOR ITCHING  50 tablet  2  . isosorbide mononitrate (IMDUR) 60 MG 24 hr tablet TAKE 1 TABLET (60 MG TOTAL) BY MOUTH DAILY.  90 tablet  1  . lidocaine (LIDODERM) 5 % Use patch for 12 hours then Remove & Discard patch within 12 hours or as directed by MD  30 patch  5  . losartan (COZAAR) 50 MG tablet TAKE 1 TABLET EVERY DAY  90 tablet  1  . magnesium oxide (MAG-OX) 400 MG tablet Take 1 tablet (400 mg total) by mouth daily.  30 tablet  6  . metFORMIN (GLUCOPHAGE) 500 MG tablet Take 1 tablet (500 mg total) by mouth 2 (two) times daily with a meal.  60 tablet  6  . montelukast (SINGULAIR) 10 MG tablet TAKE 1 TABLET AT BEDTIME  90 tablet  1  . montelukast (SINGULAIR) 10 MG tablet TAKE 1 TABLET AT BEDTIME  90 tablet  1  . nabumetone (RELAFEN) 750 MG tablet TAKE 1 TABLET BY MOUTH TWICE A DAY WITH A MEAL  180 tablet  3  . Naftifine HCl (NAFTIN) 2 % CREA Apply topically 2 (two) times daily.      Marland Kitchen neomycin-polymyxin-hydrocortisone (CORTISPORIN) otic solution Use 1 drop in affected ear three times daily as needed  10 mL  0  . NITROSTAT 0.4 MG SL tablet 1 TABLET UNDER TONGUE AT ONSET OF CHEST PAIN YOU MAY REPEAT EVERY 5 MINUTES FOR UP TO 3 DOSES.  25 tablet  6  . nystatin (MYCOSTATIN) 100000 UNIT/ML suspension GARGLE & SWALLOW 1 TEASPOONFUL 4 TIMES A DAY  120 mL  3  . pantoprazole (PROTONIX) 40 MG tablet Take 1 tablet (40 mg total) by mouth daily at 12 noon.  30 tablet  6  . potassium chloride SA (K-DUR,KLOR-CON) 20 MEQ tablet Take 1 tablet (20 mEq total) by mouth 2 (two) times daily.  60 tablet  6  . promethazine (PHENERGAN) 25 MG tablet Take 25 mg by mouth 2 (two) times daily as needed. For nausea      . tiZANidine (ZANAFLEX) 4 MG capsule Take 1 capsule (4 mg total) by mouth 3 (three) times daily as needed for muscle spasms.  90 capsule  1  .  mesalamine (ASACOL) 400 MG EC tablet Take 4 tablets (1,600 mg total) by mouth 3 (three) times daily.  460 tablet  5   No facility-administered medications prior to visit.   Allergies  Allergen Reactions  . Doxycycline Other (See Comments)    Unsteady gait  . Amoxicillin   . Aspirin Nausea And  Vomiting  . Azithromycin     REACTION: pt states "ZPak doesn't work"  . Ciprofloxacin Nausea And Vomiting  . Codeine Nausea And Vomiting  . Levofloxacin     REACTION: chest pain  . Oxycodone-Acetaminophen     REACTION: vomiting  . Sulfamethizole   . Tramadol Hcl     REACTION: pt states "spaced-out"   Patient Active Problem List   Diagnosis Date Noted  . Back pain, chronic 02/06/2012  . Hyperlipidemia 12/07/2011  . Hematuria 11/27/2011  . Syncope and collapse 11/25/2011  . Ulcerative colitis 11/25/2011  . Chronic diastolic CHF (congestive heart failure) 11/25/2011  . Lower extremity edema 11/25/2011  . Hypokalemia 11/25/2011  . Hypomagnesemia 11/25/2011  . Nonischemic cardiomyopathy 04/17/2011  . CAD (coronary artery disease)   . Bronchiectasis   . Hypertension   . PERSONAL HX COLONIC POLYPS 02/08/2010  . VITAMIN D DEFICIENCY 12/03/2009  . BRONCHITIS, ACUTE 02/28/2009  . NAUSEA AND VOMITING 12/06/2008  . DEPRESSION 08/10/2008  . PULMONARY NODULE 07/15/2008  . NEOPLASM, KIDNEY 03/16/2008  . MICROSCOPIC HEMATURIA 03/16/2008  . STRESS INCONTINENCE 03/16/2008  . COLONIC POLYPS 07/19/2007  . DIABETES MELLITUS 07/19/2007  . OBESITY 07/19/2007  . ANEMIA 07/19/2007  . ASTHMA 07/19/2007  . DIVERTICULOSIS OF COLON 07/19/2007  . ANXIETY 03/19/2007  . GERD 03/19/2007  . DEGENERATIVE JOINT DISEASE 03/19/2007  . FIBROMYALGIA 03/19/2007  . ALLERGY 03/19/2007   History  Substance Use Topics  . Smoking status: Former Smoker -- 2.00 packs/day for 15 years    Types: Cigarettes    Quit date: 04/30/1973  . Smokeless tobacco: Never Used     Comment: ex-smoker: smoked for 10-15 years up  to 2ppd, quit 1975.  Marland Kitchen Alcohol Use: No     Comment: Social drinker   family history includes Diabetes in her sister; Heart attack in her father; Hypertension in her sister; Parkinsonism in her mother; Pneumonia in her father; and Sarcoidosis in her brother.  There is no history of Colon cancer.     Objective:   Physical Exam   well-developed older African American female in no acute distress, pleasant blood pressure 120/70 pulse 72 height 5 foot 2 weight 217 BMI 39.7. HEENT; nontraumatic normocephalic EOMI PERRLA sclera anicteric,Neck; Supple no JVD, Cardiovascular; regular rate and rhythm with S1-S2 no murmur or gallop, Pulmonary; clear bilaterally, Abdomen; large soft basically nontender there is no palpable mass or hepatosplenomegaly bowel sounds are active, Rectal; exam not done, Extremities; no clubbing cyanosis or edema . Psych mood and affect normal and appropriate         Assessment & Plan:  #22  72 year old African American female with history of left-sided ulcerative colitis with mild exacerbation x2-3 weeks. #2 history of adenomatous colon polyps last colonoscopy 2007 #3 chronic antiplatelet therapy with Plavix and aspirin #4 artery artery disease status post bare-metal stent LAD 2002 #5 history of congestive heart failure #6 hypertension #7 obesity-BMI 39 #8 diabetes mellitus #9 fibromyalgia  Plan; Will let her try  Lialda  1.2 g 2 tablets by mouth twice daily which may be easier for her to take (instead of Delzicol) Add to Uceris  9 mg by mouth daily x2 months Schedule for colonoscopy with Dr. Merita Norton discussed in detail with the patient and she is agreeable to proceed. Patient will need to, off Plavix for 5 days pre procedure-will obtain consent from Dr. Ron Parker her cardiologist. Refill Rowasa enemas one correcting each bedtime x2 weeks then as needed Anusol-HC cream 2-3 times daily as needed for  rectal irritation

## 2012-11-03 NOTE — Progress Notes (Signed)
Reviewed and agree with management plan.  Shaunita Seney T. An Schnabel, MD FACG 

## 2012-11-05 ENCOUNTER — Telehealth: Payer: Self-pay | Admitting: *Deleted

## 2012-11-05 NOTE — Telephone Encounter (Signed)
Dr Ron Parker is on vacation.  Mardene Celeste Via LPN asked DOD, Dr. Minus Breeding to address this.  He reviewed the patients information. He cleared the patient for 5 days to hold the Plavix prior to the procedure date 11-12-2012.  Patient informed.

## 2012-11-05 NOTE — Telephone Encounter (Signed)
Message copied by Tonette Bihari on Wed Nov 05, 2012  9:40 AM ------      Message from: VIA, PATRICIA M      Created: Tue Nov 04, 2012  5:07 PM      Regarding: RE: Plavix clearance for colonoscopy       Hi Mckinnley Smithey, Our DOD, Dr Percival Spanish, has cleared pt to stop taking Plavix 5 days prior to Colonoscopy. I routed letter to you and I hope you got it, if not it is in pts chart under letters tab . Thanks a have a great day!            ----- Message -----         From: Tonette Bihari, CMA         Sent: 11/04/2012   9:27 AM           To: Cheryle Horsfall Via, LPN      Subject: Plavix clearance for colonoscopy                         Hello Mardene Celeste,            I sent Dr. Ron Parker a staff message ion 10-28-2012.  This patient is having a colonoscopy on 11-12-2012. She is on Plavix and we need to know if she can hold it for 5 days prior to the colonoscopy.      I heard Dr. Ron Parker is on vacation this week.  Is there a Doc of the day that could look at this and get back to [place]?      She would need to stop it on 11-07-2012 if the doctor agrees to her holding it for 5 days.             Please respond,            Marisue Humble CMA              ------

## 2012-11-05 NOTE — Telephone Encounter (Signed)
Called and reminded the patient to stop the Plavix on 11-07-2012. Also I put some samples of Uceris at front desk for her.

## 2012-11-05 NOTE — Telephone Encounter (Signed)
I left a message for [name] and asked her to call me. I let her know we hear from Dr. Ron Parker office regarding the Plavix and her colonoscopy.

## 2012-11-12 ENCOUNTER — Ambulatory Visit (AMBULATORY_SURGERY_CENTER): Payer: Medicare Other | Admitting: Gastroenterology

## 2012-11-12 ENCOUNTER — Encounter: Payer: Self-pay | Admitting: Gastroenterology

## 2012-11-12 VITALS — BP 129/58 | HR 54 | Temp 97.4°F | Resp 18 | Ht 62.0 in | Wt 217.0 lb

## 2012-11-12 DIAGNOSIS — Z86010 Personal history of colonic polyps: Secondary | ICD-10-CM

## 2012-11-12 DIAGNOSIS — D649 Anemia, unspecified: Secondary | ICD-10-CM | POA: Diagnosis not present

## 2012-11-12 DIAGNOSIS — K5150 Left sided colitis without complications: Secondary | ICD-10-CM | POA: Diagnosis not present

## 2012-11-12 DIAGNOSIS — R112 Nausea with vomiting, unspecified: Secondary | ICD-10-CM | POA: Diagnosis not present

## 2012-11-12 DIAGNOSIS — E669 Obesity, unspecified: Secondary | ICD-10-CM | POA: Diagnosis not present

## 2012-11-12 DIAGNOSIS — I2510 Atherosclerotic heart disease of native coronary artery without angina pectoris: Secondary | ICD-10-CM | POA: Diagnosis not present

## 2012-11-12 DIAGNOSIS — I10 Essential (primary) hypertension: Secondary | ICD-10-CM | POA: Diagnosis not present

## 2012-11-12 DIAGNOSIS — D126 Benign neoplasm of colon, unspecified: Secondary | ICD-10-CM | POA: Diagnosis not present

## 2012-11-12 DIAGNOSIS — F959 Tic disorder, unspecified: Secondary | ICD-10-CM | POA: Diagnosis not present

## 2012-11-12 HISTORY — PX: COLONOSCOPY W/ BIOPSIES: SHX1374

## 2012-11-12 MED ORDER — SODIUM CHLORIDE 0.9 % IV SOLN: 500.0000 mL | INTRAVENOUS | Status: DC

## 2012-11-12 MED ORDER — MESALAMINE 1.2 G PO TBEC: 2.4000 g | DELAYED_RELEASE_TABLET | Freq: Every day | ORAL | Status: DC

## 2012-11-12 NOTE — Op Note (Signed)
[facility]  [address] [place], [address]   COLONOSCOPY PROCEDURE REPORT  PATIENT: Tran, Randle  MR#: [id number] BIRTHDATE: [birth year] , 72  yrs. old GENDER: Female ENDOSCOPIST: Ladene Artist, MD, [facility] PROCEDURE DATE:  11/12/2012 PROCEDURE:   Colonoscopy with biopsy ASA CLASS:   Class III INDICATIONS:Patient's personal history of adenomatous colon polyps, High risk patient with previously diagnosed UC left-sided colitis.  MEDICATIONS: MAC sedation, administered by CRNA and propofol (Diprivan) 27m IV DESCRIPTION OF PROCEDURE:   After the risks benefits and alternatives of the procedure were thoroughly explained, informed consent was obtained.  A digital rectal exam revealed no abnormalities of the rectum.   The LB [id number] endoscope was introduced through the anus and advanced to the cecum, which was identified by both the appendix and ileocecal valve. No adverse events experienced.   The quality of the prep was good, using MoviPrep  The instrument was then slowly withdrawn as the colon was fully examined.  COLON FINDINGS: Mild diverticulosis was noted in the sigmoid colon. The colon was otherwise normal.  There was no diverticulosis, inflammation, polyps or cancers unless previously stated. Multiple random biopsies taken in the right and left colon. Retroflexed views revealed small internal hemorrhoids. The time to cecum=2 minutes 41 seconds.  Withdrawal time=9 minutes 28 seconds.  The scope was withdrawn and the procedure completed.  COMPLICATIONS: There were no complications.  ENDOSCOPIC IMPRESSION: 1.   Mild diverticulosis was noted in the sigmoid colon 2.   Small internal hemorrhoids  RECOMMENDATIONS: 1.  Await pathology results 2.  High fiber diet with liberal fluid intake. 3.  Repeat Colonoscopy in 5 years. 4.  Resume Plavix tomorrow 5.  Uceris qod for 2 weeks then discontinue, Lialda 2.4 g qd long term, refills for 1  year   eSigned:  MLadene Artist MD, FCo[facility] Hospital07/16/2014 2:39 PM

## 2012-11-12 NOTE — Progress Notes (Signed)
Patient did not experience any of the following events: a burn prior to discharge; a fall within the facility; wrong site/side/patient/procedure/implant event; or a hospital transfer or hospital admission upon discharge from the facility. ([id number])Patient did not have preoperative order for IV antibiotic SSI prophylaxis. [phone])

## 2012-11-12 NOTE — Progress Notes (Signed)
Called to room to assist during endoscopic procedure.  Patient ID and intended procedure confirmed with present staff. Received instructions for my participation in the procedure from the performing physician.

## 2012-11-12 NOTE — Patient Instructions (Signed)
Uceris  Every other day for 2 weeks then discontinue. Lialdia 2.4 gm every day for long term use.  RESUME YOUR PLAVIX TOMORROW.  HIGH FIBER DIET WITH LIBERAL FLUID INTAKE.      YOU HAD AN ENDOSCOPIC PROCEDURE TODAY AT [facility]: Refer to the procedure report that was given to you for any specific questions about what was found during the examination.  If the procedure report does not answer your questions, please call your gastroenterologist to clarify.  If you requested that your care partner not be given the details of your procedure findings, then the procedure report has been included in a sealed envelope for you to review at your convenience later.  YOU SHOULD EXPECT: Some feelings of bloating in the abdomen. Passage of more gas than usual.  Walking can help get rid of the air that was put into your GI tract during the procedure and reduce the bloating. If you had a lower endoscopy (such as a colonoscopy or flexible sigmoidoscopy) you may notice spotting of blood in your stool or on the toilet paper. If you underwent a bowel prep for your procedure, then you may not have a normal bowel movement for a few days.  DIET: Your first meal following the procedure should be a light meal and then it is ok to progress to your normal diet.  A half-sandwich or bowl of soup is an example of a good first meal.  Heavy or fried foods are harder to digest and may make you feel nauseous or bloated.  Likewise meals heavy in dairy and vegetables can cause extra gas to form and this can also increase the bloating.  Drink plenty of fluids but you should avoid alcoholic beverages for 24 hours.  ACTIVITY: Your care partner should take you home directly after the procedure.  You should plan to take it easy, moving slowly for the rest of the day.  You can resume normal activity the day after the procedure however you should NOT DRIVE or use heavy machinery for 24 hours (because of the sedation  medicines used during the test).    SYMPTOMS TO REPORT IMMEDIATELY: A gastroenterologist can be reached at any hour.  During normal business hours, 8:30 AM to 5:00 PM Monday through Friday, call [phone].  After hours and on weekends, please call the GI answering service at [phone] who will take a message and have the physician on call contact you.   Following lower endoscopy (colonoscopy or flexible sigmoidoscopy):  Excessive amounts of blood in the stool  Significant tenderness or worsening of abdominal pains  Swelling of the abdomen that is new, acute  Fever of 100F or higher  FOLLOW UP: If any biopsies were taken you will be contacted by phone or by letter within the next 1-3 weeks.  Call your gastroenterologist if you have not heard about the biopsies in 3 weeks.  Our staff will call the home number listed on your records the next business day following your procedure to check on you and address any questions or concerns that you may have at that time regarding the information given to you following your procedure. This is a courtesy call and so if there is no answer at the home number and we have not heard from you through the emergency physician on call, we will assume that you have returned to your regular daily activities without incident.  SIGNATURES/CONFIDENTIALITY: You and/or your care partner have signed paperwork which will be  entered into your electronic medical record.  These signatures attest to the fact that that the information above on your After Visit Summary has been reviewed and is understood.  Full responsibility of the confidentiality of this discharge information lies with you and/or your care-partner.

## 2012-11-12 NOTE — Progress Notes (Signed)
Patient to check # of Uceris at home, will need 7 pills to complete course. Patient receiving medication from Dr. Lynne Leader office related to insurance issues with this medication. Patient verbalized agreement to hold Metformin and Amaryl today, restarting in am. Patient verbalized understanding to restrt her Plavix in am. Patient to restroom prior to discharge.

## 2012-11-13 ENCOUNTER — Telehealth: Payer: Self-pay

## 2012-11-13 NOTE — Telephone Encounter (Signed)
  Follow up Call-  Call back number 11/12/2012  Post procedure Call Back phone  # [phone]  Permission to leave phone message Yes     Patient questions:  Do you have a fever, pain , or abdominal swelling? no Pain Score  0 *  Have you tolerated food without any problems? yes  Have you been able to return to your normal activities? yes  Do you have any questions about your discharge instructions: Diet   no Medications  no Follow up visit  no  Do you have questions or concerns about your Care? no  Actions: * If pain score is 4 or above: No action needed, pain <4.

## 2012-11-17 ENCOUNTER — Encounter: Payer: Self-pay | Admitting: Gastroenterology

## 2012-11-18 ENCOUNTER — Other Ambulatory Visit: Payer: Self-pay | Admitting: Pulmonary Disease

## 2012-11-19 ENCOUNTER — Other Ambulatory Visit: Payer: Self-pay | Admitting: Pulmonary Disease

## 2012-11-30 ENCOUNTER — Other Ambulatory Visit: Payer: Self-pay | Admitting: Pulmonary Disease

## 2012-12-03 ENCOUNTER — Telehealth: Payer: Self-pay | Admitting: Pulmonary Disease

## 2012-12-03 MED ORDER — TIZANIDINE HCL 4 MG PO CAPS: 4.0000 mg | ORAL_CAPSULE | Freq: Three times a day (TID) | ORAL | Status: DC | PRN

## 2012-12-03 NOTE — Telephone Encounter (Signed)
Rx has been sent in.

## 2012-12-04 ENCOUNTER — Telehealth: Payer: Self-pay | Admitting: Pulmonary Disease

## 2012-12-04 MED ORDER — NEOMYCIN-POLYMYXIN-HC 3.5-10000-1 OT SOLN: 2.0000 [drp] | Freq: Three times a day (TID) | OTIC | Status: DC

## 2012-12-04 MED ORDER — CEFADROXIL 500 MG PO CAPS: 500.0000 mg | ORAL_CAPSULE | Freq: Two times a day (BID) | ORAL | Status: DC

## 2012-12-04 NOTE — Telephone Encounter (Signed)
Spoke with pt-- Pt c/o inner right ear pain and swelling behind R ear x 2 days. Pt states that she has had an inner ear infection before and this is the same pain as before.  Pt requesting Rx for abx and ear drops. [facility]  Allergies  Allergen Reactions  . Doxycycline Other (See Comments)    Unsteady gait  . Amoxicillin   . Aspirin Nausea And Vomiting  . Azithromycin     REACTION: pt states "ZPak doesn't work"  . Ciprofloxacin Nausea And Vomiting  . Codeine Nausea And Vomiting  . Levofloxacin     REACTION: chest pain  . Oxycodone-Acetaminophen     REACTION: vomiting  . Sulfamethizole   . Tramadol Hcl     REACTION: pt states "spaced-out"   Please advise Dr Lenna Gilford. Thanks.

## 2012-12-04 NOTE — Telephone Encounter (Signed)
Per SN--  Call in duricef 500 mg  1 po bid and cortisporin ear drops  1 vial,  2 drops in affected ear TID.  If this does not clear up she will need to be seen by ENT.  Pt is aware of meds sent to the pharmacy and of SN recs.  Pt to call back if not feeling better with meds.

## 2012-12-05 ENCOUNTER — Other Ambulatory Visit: Payer: Self-pay | Admitting: Pulmonary Disease

## 2012-12-18 ENCOUNTER — Other Ambulatory Visit: Payer: Self-pay | Admitting: Cardiology

## 2012-12-18 ENCOUNTER — Other Ambulatory Visit: Payer: Self-pay | Admitting: Pulmonary Disease

## 2012-12-19 ENCOUNTER — Other Ambulatory Visit: Payer: Self-pay | Admitting: Nurse Practitioner

## 2012-12-22 ENCOUNTER — Other Ambulatory Visit: Payer: Self-pay | Admitting: Cardiology

## 2012-12-23 ENCOUNTER — Telehealth: Payer: Self-pay | Admitting: Pulmonary Disease

## 2012-12-23 MED ORDER — LOSARTAN POTASSIUM 50 MG PO TABS: 50.0000 mg | ORAL_TABLET | Freq: Every day | ORAL | Status: DC

## 2012-12-23 NOTE — Telephone Encounter (Signed)
[facility] has [facility] per their request.

## 2012-12-31 ENCOUNTER — Other Ambulatory Visit: Payer: Self-pay | Admitting: Pulmonary Disease

## 2012-12-31 ENCOUNTER — Other Ambulatory Visit: Payer: Self-pay | Admitting: Nurse Practitioner

## 2013-01-12 ENCOUNTER — Other Ambulatory Visit: Payer: Self-pay

## 2013-01-12 ENCOUNTER — Other Ambulatory Visit (HOSPITAL_COMMUNITY): Payer: Self-pay | Admitting: Cardiology

## 2013-01-14 ENCOUNTER — Other Ambulatory Visit: Payer: Self-pay | Admitting: Cardiology

## 2013-02-02 DIAGNOSIS — H0019 Chalazion unspecified eye, unspecified eyelid: Secondary | ICD-10-CM | POA: Diagnosis not present

## 2013-02-10 ENCOUNTER — Other Ambulatory Visit (INDEPENDENT_AMBULATORY_CARE_PROVIDER_SITE_OTHER): Payer: Medicare Other

## 2013-02-10 ENCOUNTER — Ambulatory Visit (INDEPENDENT_AMBULATORY_CARE_PROVIDER_SITE_OTHER)
Admission: RE | Admit: 2013-02-10 | Discharge: 2013-02-10 | Disposition: A | Discharge status: Home / Self Care | Payer: Medicare Other | Source: Ambulatory Visit | Attending: Pulmonary Disease | Admitting: Pulmonary Disease

## 2013-02-10 ENCOUNTER — Encounter: Payer: Self-pay | Admitting: Pulmonary Disease

## 2013-02-10 ENCOUNTER — Ambulatory Visit (INDEPENDENT_AMBULATORY_CARE_PROVIDER_SITE_OTHER): Payer: Medicare Other | Admitting: Pulmonary Disease

## 2013-02-10 VITALS — BP 118/62 | HR 70 | Temp 97.6°F | Ht 62.0 in | Wt 217.2 lb

## 2013-02-10 DIAGNOSIS — IMO0001 Reserved for inherently not codable concepts without codable children: Secondary | ICD-10-CM

## 2013-02-10 DIAGNOSIS — K5730 Diverticulosis of large intestine without perforation or abscess without bleeding: Secondary | ICD-10-CM

## 2013-02-10 DIAGNOSIS — I2510 Atherosclerotic heart disease of native coronary artery without angina pectoris: Secondary | ICD-10-CM

## 2013-02-10 DIAGNOSIS — K5190 Ulcerative colitis, unspecified, without complications: Secondary | ICD-10-CM

## 2013-02-10 DIAGNOSIS — I10 Essential (primary) hypertension: Secondary | ICD-10-CM | POA: Diagnosis not present

## 2013-02-10 DIAGNOSIS — J45909 Unspecified asthma, uncomplicated: Secondary | ICD-10-CM

## 2013-02-10 DIAGNOSIS — E119 Type 2 diabetes mellitus without complications: Secondary | ICD-10-CM

## 2013-02-10 DIAGNOSIS — G8929 Other chronic pain: Secondary | ICD-10-CM

## 2013-02-10 DIAGNOSIS — E669 Obesity, unspecified: Secondary | ICD-10-CM

## 2013-02-10 DIAGNOSIS — J479 Bronchiectasis, uncomplicated: Secondary | ICD-10-CM | POA: Diagnosis not present

## 2013-02-10 DIAGNOSIS — K219 Gastro-esophageal reflux disease without esophagitis: Secondary | ICD-10-CM

## 2013-02-10 DIAGNOSIS — M1990 Unspecified osteoarthritis, unspecified site: Secondary | ICD-10-CM

## 2013-02-10 DIAGNOSIS — E785 Hyperlipidemia, unspecified: Secondary | ICD-10-CM

## 2013-02-10 DIAGNOSIS — F411 Generalized anxiety disorder: Secondary | ICD-10-CM

## 2013-02-10 DIAGNOSIS — N39498 Other specified urinary incontinence: Secondary | ICD-10-CM

## 2013-02-10 LAB — HEMOGLOBIN A1C: Hgb A1c MFr Bld: 7.5 % — ABNORMAL HIGH (ref 4.6–6.5)

## 2013-02-10 LAB — BASIC METABOLIC PANEL
Calcium: 9.1 mg/dL (ref 8.4–10.5)
Creatinine, Ser: 0.6 mg/dL (ref 0.4–1.2)

## 2013-02-10 LAB — MAGNESIUM: Magnesium: 1.7 mg/dL (ref 1.5–2.5)

## 2013-02-10 MED ORDER — HYDROCODONE-ACETAMINOPHEN 5-325 MG PO TABS: ORAL_TABLET | ORAL | Status: DC

## 2013-02-10 NOTE — Patient Instructions (Signed)
Today we updated your med list in our EPIC system...    Continue your current medications the same...  We refilled your Vicodin per request...  Today we did your follow up CXR & DM blood work...    We will contact you w/ the results when available...   Lets get on track w/ our diet & exercise program...    The goal is to lose 10-15 lbs...  Call for any questions...  Let's plan a follow up visit in 76mow/ FASTING blood work at that time..Marland KitchenMarland Kitchen

## 2013-02-10 NOTE — Progress Notes (Signed)
Subjective:    Patient ID: [name], female    DOB: [birth year], 72 y.o.   MRN: [id number]  HPI 72 y/o BF here for a follow up visit... she has multiple medical problems as noted below...    ~  August 03, 2011:  869moROV & JCamdynnis c/o sinusitis "blowing pieces of meat w/ blood in it", swollen glands, drainage, pain into ears, etc> we discussed Rx w/ Augmentin,Mucinex, MMW, Flonase, Saline, etc... Breathing is stable other than sinus symptoms & denies much cough, phlegm, ch in dyspnea;  Cardiac followed by DBenay Spice& Imdur incr to 643md & f/u Myoview was felt to be a neg study;  Chol stable on Lip10 but DM control worse on Metform alone & we are going to add Glimep1m69m;  She's had mod problem w/ LBP & saw Ortho w/ epidural steroid injections that have helped her pain... CXR 4/13 showed cardiomeg, calcif Ao, incr markings, no nodule seen, NAD... LABS 4/13:  FLP-at goals on Lip10;  Chems- ok x BS=151 A1c=8.2;  CBC-ok;  TSH=1.30;  BNP=42  ~  February 06, 2012:  69mo37mo & she has been hosp twice in the interval> 1) 7/28- 11/28/11 while visiting relative in ER she has a syncopal episode, believed to be vasovagal; w/u was neg- EKG, enz, monitor, 2DEcho showed DD, CDopplers, not orthostatic, K=3.2 repleted, BS=130-170 & A1c=7.1, Urine +pan-sens EColi and treated w/ Augmentin (she's allergic to cipro)...  Marland KitchenMarland Kitchen Adm 8/9 - 12/08/11 by Cards w/ CP for cath> showed patent LAD stent, mild CAD elsewhere, EF=65%; meds adjusted- see below, & Omep changed to PROTONIX40mg62mue to Plavix...    Chol looked good on Lip10 during her hosp stay...    She is not currently on Metformin (prev 500mgB41m/ the Amaryl1mgQam49msh3 doesn't know why & I presume stopped w/ contrast studies in hosp 7 never placed back on her med list at disch therefore lost; Labs today showed ==> she didn't go to lab as requested!    Her CC remains diffuse aches & pains related to her FM "it runs in the family"; she has Vicodin- allowed up to 3/d, and  Relafen 750Bid per [facility]VoyteBarn[facility] Hospitalotes recent FM exac ppt by sitting on hard benches at a [employer] for heat, & we discussed trial LIDODERM patches...     She has a severe generalized anxiety disorder> on Xanax as needed up to 3/d...    We reviewed prob list, meds, xrays and labs> see below for updates >> she refuses the Flu shot; didn't bring med bottle or med list to office today...  ~  August 06, 2012:  71mo ROV52mooann isKariyahstated by the death of her mother last month; in addition she notes the pollen is bothering her early this spring...  We reviewed the following medical problems during today's office visit >>     Allergic Rhinitis> on OTC Antihist, Singulair, Flonase & nasal saline mist; c/o the early spring pollen, we reviewed Rx...    Asthma, Bronchiectasis, & LLL nodule on CT scan> she only takes her breathing meds as needed including Albuterol for NEB vs Proventil MDI, & she stopped Advair on her own; prob granuloma LLL (no change- see below)...    HBP & CAD> on Aten50, Amlodip10, Losar50, Lasix40-3/d, K20Bid, Imdur60, Plavix75; she has some CWP but no angina> BP today= 118/78 & she notes some atypCP, rare palpit, occas dizzy w/o syncope, +SOB/ DOE, & intermittent edema...    Chol> FLP  looks good on Lipitor 19m/d w/ TChol 126, TG 92, HDL 44, LDL 64; encouraged to lose some weight!    DM & Obesity> DM contol is adeq on Metform500Bid + Glimep116mam; labs 4/14 showed BS=147, A1c=7.2 & we decided same meds get wt down...    GI> GERD, Divertics, Hx UC, Polyps> she saw DrStark 10/13 & she was stable on her Omep40110m for reflux, Asacol 4.8g daily & Canasa suppos prn for her UC, & f/u colon overdue & she will call to set up...    GU> followed by DrWrenn as noted below w/ stress incont, hx UTIs, hx hematuria, & prob cyst left kidney which he continues to follow...    DJD, FM> managed by DrVoytek on Relafen, Lorcet, & Zanaflex + various other pain meds over the year...     Anxiety & Depression> Mother passed away 3/1March 15, 2024he takes Xanax 0.5mg66mn... We reviewed prob list, meds, xrays and labs> see below for updates >> she refuses the seasonal Flu vaccines;  Time spent 35 min... LABS 4/14:  FLP- at goals on Lip10;  Chems- ok x BS=147, A1c=7.2, K=3.4, Mg=1.5;  CBC- wnl w/ Hg=12.3, MCV=92, Fe=59 (15%sat);  TSH=1.42;  VitD=39;  BNP=36...  ~  February 10, 2013:  87mo 59mo& [name]/o prob w/ her "allergies" despite Singulair, OTC antihist & Flonase; she has mult medical problems and an extensive medication list- of course she did not bring her bottles or list to the OV today...    Allergic Rhinitis> on OTC Antihist, Singulair, Flonase, Mucinex & nasal saline mist; we reviewed treatment...    Asthma, Bronchiectasis, & LLL nodule on CT scan> she only takes her breathing meds as needed including Albuterol for NEB vs Proventil MDI, & she stopped Advair on her own; prob granuloma LLL (no change- see below)...    HBP & CAD> on Aten50, Amlodip10, Losar50, Lasix40-3/d, K20Bid, Imdur60, Plavix75; she has some CWP but no angina> BP today= 118/62 & she notes some atypCP, rare palpit, occas dizzy w/o syncope, +SOB/ DOE, & intermittent edema...    Chol> on Lipitor 10mg/18m FLP 4/14 showing TChol 126, TG 92, HDL 44, LDL 64; encouraged to lose some weight!    DM & Obesity> DM contol is adeq on Metform500Bid + Glimep1mgQam25mabs 10/14 showed BS=131, A1c=7.5 & we decided same meds & get wt down...    GI> GERD, Divertics, Hx UC, Polyps> she saw DrStark 7/14 & she was stable on her PPI (Protonix40) for reflux, Asacol 2 daily & Canasa suppos prn for her UC, & f/u colon done 7/14 w/ sigm divertics, no polyps, no active colitis; continue meds, recheck 46yrs.  62yr> followed by DrWrenn as noted below w/ stress incont, hx UTIs, hx hematuria, & prob cyst left kidney which he continues to follow...    DJD, FM> managed by DrVoytek on Relafen, Vicodin, & Zanaflex + various other pain meds over the years;  she says DrV does "shots" as needed...    Anxiety & Depression> Mother passed away 3/14; sh15-Mar-2024kes Xanax 0.5mg prn.66mWe reviewed prob list, meds, xrays and labs> see below for updates >> she refuses the 2014 Flu vaccine... CXR 10/14 showed cardiomeg, mild peribronchial thickening, NAD, some thoracic spondylosis noted... LABS 10/14:  Chems- ok x BS=131, A1c=7.5             Problem List:               ALLERGY (ICD-995.3) - on Saline, FLONASE 2spBid, etc;  she had a neg ENT eval by Barbaraann Faster 11/13- chr rhinitis, she had a neg CT Sinus...  ASTHMA (ICD-493.90) - prev on Advair but she stopped on her own> she takes MUCINEX, NEBULIZER w/ Albut as needed & PROAIR Prn when out & about... she is an ex-smoker having quit in 1975, no recent exacerbations.  BRONCHIECTASIS (ICD-494.0) - CT Chest 3/10 showed bilat LL bronchiectasis L>R; advised to take meds regularly & avoid infections> NEBS, MUCINEX, Fluids, etc...  PULMONARY NODULE (ICD-518.89) - 1.4cm nodule in LLL showed up on Urology CTAbd 07/12/08>  CT Chest 07/22/08 confirmed 1.4cm peripheral LLL nodule that wasn't present on CT in 2005 & no other lesions, +bronchiectasis in both LLs L>R, mod cardiomegaly...  PET Scan 4/10 showed no abn hypermetabolic activity- therefore likely scar or granuloma... ~  CXR 2/11 showed cardiomeg, some COPD, NAD- LLL nodule seen on CT 3/10 is not vis on CXR. ~  CXR 1/12 in ER showed cardiomeg, vasc congestion, no overt edema, no nodule seen. ~  CXR 4/13 showed cardiomeg, calcif Ao, incr markings, no nodule seen, NAD.Marland Kitchen. ~  CXR 8/13 via [place] showed borderline heart size, sl incr interstitial markings (mild edema), calcif in Ao, NAD.Marland Kitchen. ~  CXR 10/14 showed cardiomeg, mild peribronchial thickening, NAD, some thoracic spondylosis noted.  HYPERTENSION (ICD-401.9) - controlled on ATENOLOL 12m/d, NORVASC 16md, LOSARTAN 10072m, LASIX 40m35m tabs/d (2AM, 1PM), KCL 20Bid...  ~  9/12:  BP=138/80 today, not checking BP at home...  denies HAs, visual changes, palipit, dizziness, syncope, dyspnea, edema, etc... ~  4/13:  BP= 126/74 & she is relatively asymptomatic but has mult somatic complaints... ~  4/14:  on Aten50, Amlodip10, Losar50, Lasix40-3/d, K20Bid, Imdur60, Plavix75; she has some CWP but no angina> BP today= 118/78 & she notes some atypCP, rare palpit, occas dizzy w/o syncope, +SOB/ DOE, & intermittent edema. ~  Note> 4/14 labs showed K=3.4, Mg=1.5, & she is requested to take the K20Bid & add Magox 400/d... ~  10/14: on Aten50, Amlodip10, Losar50, Lasix40-3/d, K20Bid, Imdur60, Plavix75; she has some CWP but no angina> BP today= 118/62 & she remains stable...  CORONARY ARTERY DISEASE (ICD-414.00) - on PLAVIX 75mg15mINTOL/ allergic to ASA) & IMDUR 60mg 68my... ~  she is s/p PTCA & stent to the LAD on 10/02... ~  last cath 2/07 w/ patent LAD stent, non-obstructive dis otherwise, EF=65%... ~  last NuclearStressTest 5/08 showed no ischemia, EF=64% (similiar to 2005)... ~  hosp in Jun09 w/ CP- cath showing non-obstructive CAD w/ 25-40% lesions in all 3 vessels, prev stent in LAD patent, norm LVF.. ~  Baseline EKG shows NSR, minor NSSTTWA, NAD... ~  Last Cards f/u 12/12> Cad, stent in LAD, c/o freq atypCPs, IMDUR increased to 60mg/d34mcouraged to incr exercise. ~  f/u MYOVIEW 12/12 showed prob normal stress nuclear study w/ mild reversible defect in anterior wall likely due to breast attenuation- cannot completely exclude ischemia. ~  3/14:  She saw DrKatz for f/u CAD, DiastolicCHF, Hx syncope- she denied CP, no further dizziness; doing well on medical management, no changes made... ~  EKG 3/14 by DrKatz Benay Spice NSR, rate73, ?RAE, borderline tracing...  CHEST PAIN, ATYPICAL (ICD-786.59) - she has chronic CWP, prob related to her fibromyalgia... ~  1/12:  she went to ER, then [facility] w/ left CWP- cardiac evals neg, treated w/ rest, heat, Vicodin, Tussionex, etc... subseq got shots [facility]  [facility]Tri[facility]CHOLESTEROLEM[facility] (ICD-272.0) - on LIPITOR 10mg/d,57miet Rx.. ~  FLP 11/[place] TChol 132, TG  58, HDL 60, LDL 60 ~  FLP 7/09 showed TChol 116, TG 49, HDL 49, LDL 57 ~  FLP in hosp 3/10 showed TChol 130, TG 55, HDL 53, LDL 66 ~  FLP 2/11 on Cres5 showed TChol 104, TG 67, HDL 50, LDL 40 ~  Crestor 64m changed to Lipitor 179mper insurance co. ~  FLP 3/12 on Lip10 showed TChol 139, TG 64, HDL 57, LDL 69 ~  FLP 9/12 on Lip10 showed TChol 126, TG 79, HDL 54, LDL 57 ~  FLP 4/13 on Lip10 showed TChol 131, TG 95, HDL 52, LDL 60 ~  FLP 8/13 on Lip10 showed TChol 121, TG 68, HDL 49, LDL 58 ~  FLP 4/14 on Lip10 showed TChol 126, TG 92, HDL 44, LDL 64  DIABETES MELLITUS (ICD-250.00) - on METFORMIN 50022md, + low carb diet... ~  last labs 11/08 showed BS=120, HgA1c=6.2 ~  labs 6/09 in hosp showed BS=110-120, HgA1c= 6.6... Marland KitchenMarland Kitchenme meds, needs better diet! ~  labs 3/10 in hosp showed BS= 113-155, HgA1c= 6.7 ~  labs 2/11 showed BS= 108, A1c= 7.1 ~  labs 1/12 in ER showed BS= 167... ~  labs 3/12 showed BS= 121, A1c= 7.2... Marland KitchenMarland Kitchenntinue same meds, better diet! ~  Labs 9/12 showed BS= 131, A1c= 6.9... Marland KitchenMarland Kitchenntinue Metform500Bid ~  Labs 4/13 showed BS= 151, A1c= 8.2... Marland KitchenMarland Kitcheneds more meds> add GLIMEPIRIDE 1mg16m ~  9/13:  She had neg Optometry f/u w/ DrMcFarland- no retinopathy, early cats noted... ~  10/13:  She is not currently on Metformin (prev 500mg21mw/ the Amaryl1mgQa78m she doesn't know why & I presume stopped w/ contrast studies in hosp & never placed back on her med list at disch therefore lost; Asked to restart Metformin 500mgBi39mGlimep1mg/d &7mV revcheck 3-54mo... ~35mo14: DM contol is adeq on Metform500Bid + Glimep1mgQam; l38m 4/14 showed BS=147, A1c=7.2... ~  LabsMarland Kitchen10/14 on Metform500Bid & Glim1mg showed81mS=131, A1c=7.5; same meds, needs better diet...  OBESITY (ICD-278.00)   ~  weight 11/09 = 204# ~  weight 3/10 = 208# ~  weight 2/11 = 205#... we reviewed diet + exercise program. ~   weight 1/12 = 208# ~  Weight 3/12 = 211#... We reviewed diet & exercise yet again. ~  Weight 9/12 = 216# ~  Weight 4/13 = 217#... We reviewed diet, exercise, wt reduction program. ~  Weight 10/13 = 213# ~  Weight 4/14 = 216# ~  Weight 10/14 = 217#  GERD (ICD-530.81) - prev on Prilosec 20mg- takin11m2 daily but insurance won't pay- wanted Omeprazole 40mg/d- ok; 8mCards changed to PROTONIX 40mgClarendono7mvix use...  DIVERTICULOSIS OF COLON (ICD-562.10) & COLONIC POLYPS (ICD-211.3) - last colonoscopy was 7/08 w/ divertics & colitis seen, + hyperplastic polyp removed... ~  1/13:  DrStark noted that she is due for f/u surveillance colon but she wanted to put it off; encouraged to call his office for this important procedure... ~  7/14: she finally had her f/u colon by DrStark> mild divertics in sigmoid, sm int hems, otherw neg; rec to continue hi fiber, Lialda2.4gm/d, & f/u 16yrs...  Hx of79yrERATIVE COLITIS (ICD-556.9) - followed by DrStark for GI- she has been on on Lialda, Rowasa, & Hydrocort enemas in the past... ~  seen 10/11 by DrStark w/ freq loose stools- Asacol incr to 4tabs Tid & CANASA suppos for 33mo. ~  Her mai47moance regimen is ASACOL 400mg- 2tabs Tid 76m med list from DrStark shows 4tabsTid) and CANASA suppos  as needed... ~  10/13:  She had a GI f/u w/ DrStark> left sided UC w/ intermitt diarrhea, hems, hx adenomatous polyp; controlled on Asacol 4/d ~  7/14: she had GI f/u w/ AE for DrStark> hx left colon UC & prev ademoatous polyps; under control clinically & f/u colon 7/14 as above- no active colitis; rec to continue the Asacol2.4gm/d...  MICROSCOPIC HEMATURIA (ICD-599.72),  NEOPLASM, KIDNEY (ICD-239.5),  STRESS INCONTINENCE (ICD-788.39) - eval and Rx by DrWrenn 9/09 w/ stress incontinence, dysuria w/ microscopic hematuria-  Cysto w/ bladder bx was neg; and CT + Sonar w/ 1.8cm left upper pole renal lesion of ?etiology- poss tumor vs cyst (min enhancement, solid on sonar w/ min  blood flow + 2 sm cysts also seen)...  they are following a watchful waiting protocol & repeat CT 3/10 showed stable 1.7cm category II cyst upper pole of left kidney without change... f/u planned 54yr ~  Renal Sonar 7/13 showed 2cm nodule (cyst) in upper pole of left kidney w/o change serially... ~  8/13:  She had f/u visit w/ DrWrenn> hx hemorrhagic cystitis & treated w/ Septra, she was supposed to f/u w/ them...`  DEGENERATIVE JOINT DISEASE (ICD-715.90) >> she is followed by DrVoytek et al prev on Celebrex, Parafon, Lorcet... FIBROMYALGIA (ICD-729.1) >> now on VICODIN up to Tid & RELAFEN 75669mBid prn... LOW BACK PAIN >> prev shots from DrVoytek et al... ~  she was seen 12/11 w/ bilat foot pain & XRay showed arthritic changes- given DepoMedrol+Toradol shot IM. ~  11/12: seen by Ortho for back & leg pain> MRI showed lumbar spondylosis & mod central canal stenosis; given ESI 11/12 w/ 75% improvement in pain. ~  She reports that ESI was repeated 3/13 & further improved by her hx... ~  She identifies DrVoytek as her chronic pain doctor=> seen 10/13 & her note is reviewed...  VITAMIN D DEFICIENCY (ICD-268.9) - Vit D level 2/11= 11 and pt rec to take Vit D 50000 u weekly but ?never filled this Rx... therefore rec 8/11 to switch to OTC Vit D 5000 u daily from the health food store...  ~  Labs 3/12 showed Vit D level = 46 ~  Labs 4/14 showed Vit D levewl = 39 & she is rec to take the 5000u OTC Vit D supplement every day...  ANXIETY (ICD-300.00) - she has lots of stress caring for Momma... on ALPRAZOLAM 0.69m21mid Prn...  Hx of ANEMIA (ICD-285.9) - on NUIRON 150m269m..  ~  labs 11/08 showed Hg=12.2, Fe=38/ TIBC=287/ sat=9%... ~  labs 3/10 in hosp showed Hg= 13.0, MCV= 92... ~  labs 2/11 showed Hg= 12.7, Fe= 67... ~  labs 1/12 in ER showed Hg= 11.7 ~  Labs 3/12 showed Hg= 12.3, MCV= 93 ~  Labs 9/12 showed Hg= 12.5, MCV= 94 ~  Labs 4/13 showed Hg= 12.7 ~  Labs 8/13 showed Hg= 11.5 ~  laqbs 4/14  showed Hg= 12.3, MCV=92, Fe=59 (15%sat) & rec to continue daily iron supplement...   Past Surgical History  Procedure Laterality Date  . Cholecystectomy    . Hand surgery  01/2006    Right Hand Surgery by Dr. GramAmedeo PlentyCoronary stent placement  2002    BMS to the LAD  . Abdominal hysterectomy    . Cardiac catheterization  11/2011    Outpatient Encounter Prescriptions as of 02/10/2013  Medication Sig Dispense Refill  . acetaminophen (TYLENOL) 500 MG tablet Take 500 mg by mouth every 6 (six) hours as needed.      .Marland Kitchen  albuterol (PROVENTIL) (2.5 MG/3ML) 0.083% nebulizer solution Take 2.5 mg by nebulization 3 (three) times daily as needed.      . ALPRAZolam (XANAX) 0.5 MG tablet TAKE 1 TABLET 3 TIMES A DAY AS NEEDED  90 tablet  5  . amLODipine (NORVASC) 10 MG tablet TAKE 1 TABLET BY MOUTH EVERY DAY  90 tablet  3  . atenolol (TENORMIN) 50 MG tablet TAKE 1 TABLET BY MOUTH EVERY DAY  90 tablet  3  . atorvastatin (LIPITOR) 10 MG tablet TAKE 1 TABLET EVERY DAY  90 tablet  0  . Blood Glucose Monitoring Suppl (ACCU-CHEK AVIVA PLUS) W/DEVICE KIT 1 Device by Does not apply route daily. Check blood sugar once daily---DX  250.00  1 kit  0  . Cholecalciferol (VITAMIN D-3) 5000 UNITS TABS Take 5,000 Units by mouth daily.        . clopidogrel (PLAVIX) 75 MG tablet TAKE 1 TABLET BY MOUTH EVERY DAY  90 tablet  3  . fluticasone (FLONASE) 50 MCG/ACT nasal spray Place 2 sprays into the nose at bedtime.  16 g  11  . furosemide (LASIX) 40 MG tablet Take 40 mg by mouth 3 (three) times daily.      Marland Kitchen glimepiride (AMARYL) 1 MG tablet Take 1 tablet (1 mg total) by mouth daily before breakfast.  30 tablet  6  . glucose blood (ACCU-CHEK AVIVA PLUS) test strip Check blood sugar once daily or as directed  100 each  12  . HYDROcodone-acetaminophen (NORCO/VICODIN) 5-325 MG per tablet TAKE 1/2 TO 1 TABLET THREE TIMES A DAY AS NEEDED FOR PAIN  90 tablet  5  . hydrocortisone (ANUSOL-HC) 2.5 % rectal cream Use small amount to the  rectal area 3-4 times daily.  30 g  2  . hydrocortisone-pramoxine (ANALPRAM-HC) 2.5-1 % rectal cream Place rectally as needed. APPLY A SMALL AMOUNT TO AFFECTED AREA.      . hydrOXYzine (ATARAX/VISTARIL) 25 MG tablet TAKE 1 TABLET BY MOUTH EVERY 4 HOURS AS NEEDED FOR ITCHING  50 tablet  2  . isosorbide mononitrate (IMDUR) 60 MG 24 hr tablet TAKE 1 TABLET (60 MG TOTAL) BY MOUTH DAILY.  90 tablet  1  . lidocaine (LIDODERM) 5 % Use patch for 12 hours then Remove & Discard patch within 12 hours or as directed by MD  30 patch  5  . losartan (COZAAR) 50 MG tablet Take 1 tablet (50 mg total) by mouth daily.  90 tablet  1  . magnesium oxide (MAG-OX) 400 MG tablet Take 1 tablet (400 mg total) by mouth daily.  30 tablet  6  . mesalamine (LIALDA) 1.2 G EC tablet Take 2 tablets (2.4 g total) by mouth daily with breakfast. Take 2.4 gm. daily  60 tablet  12  . mesalamine (ROWASA) 4 G enema Place 60 mLs (4 g total) rectally at bedtime.  14 Bottle  2  . metFORMIN (GLUCOPHAGE) 500 MG tablet Take 1 tablet (500 mg total) by mouth 2 (two) times daily with a meal.  60 tablet  6  . montelukast (SINGULAIR) 10 MG tablet TAKE 1 TABLET AT BEDTIME  90 tablet  1  . nabumetone (RELAFEN) 750 MG tablet TAKE 1 TABLET BY MOUTH TWICE A DAY WITH A MEAL  180 tablet  3  . Naftifine HCl (NAFTIN) 2 % CREA Apply topically 2 (two) times daily.      Marland Kitchen neomycin-polymyxin-hydrocortisone (CORTISPORIN) otic solution Use 1 drop in affected ear three times daily as needed  10  mL  0  . NITROSTAT 0.4 MG SL tablet 1 TABLET UNDER TONGUE AT ONSET OF CHEST PAIN YOU MAY REPEAT EVERY 5 MINUTES FOR UP TO 3 DOSES.  25 tablet  0  . nystatin (MYCOSTATIN) 100000 UNIT/ML suspension GARGLE & SWALLOW 1 TEASPOONFUL 4 TIMES A DAY  120 mL  3  . pantoprazole (PROTONIX) 40 MG tablet TAKE 1 TABLET BY MOUTH DAILY AT 12 NOON.  30 tablet  2  . potassium chloride SA (K-DUR,KLOR-CON) 20 MEQ tablet Take 1 tablet (20 mEq total) by mouth 2 (two) times daily.  60 tablet  6  .  promethazine (PHENERGAN) 25 MG tablet Take 25 mg by mouth 2 (two) times daily as needed. For nausea      . tiZANidine (ZANAFLEX) 4 MG capsule Take 1 capsule (4 mg total) by mouth 3 (three) times daily as needed for muscle spasms.  90 capsule  1  . [DISCONTINUED] Budesonide (UCERIS) 9 MG TB24 Take 9 mg by mouth daily.  30 tablet  1  . [DISCONTINUED] cefadroxil (DURICEF) 500 MG capsule Take 1 capsule (500 mg total) by mouth 2 (two) times daily.  14 capsule  0  . [DISCONTINUED] cefdinir (OMNICEF) 300 MG capsule Take 1 capsule (300 mg total) by mouth 2 (two) times daily.  14 capsule  0   No facility-administered encounter medications on file as of 02/10/2013.    Allergies  Allergen Reactions  . Doxycycline Other (See Comments)    Unsteady gait  . Amoxicillin   . Aspirin Nausea And Vomiting  . Azithromycin     REACTION: pt states "ZPak doesn't work"  . Ciprofloxacin Nausea And Vomiting  . Codeine Nausea And Vomiting  . Levofloxacin     REACTION: chest pain  . Oxycodone-Acetaminophen     REACTION: vomiting  . Sulfamethizole   . Tramadol Hcl     REACTION: pt states "spaced-out"    Current Medications, Allergies, Past Medical History, Past Surgical History, Family History, and Social History were reviewed in [employer] record.    Review of Systems         See HPI - all other systems neg except as noted...  The patient complains of weight gain, decreased hearing, chest pain, dyspnea on exertion, muscle weakness, and difficulty walking.  The patient denies anorexia, fever, weight loss, vision loss, hoarseness, syncope, peripheral edema, prolonged cough, headaches, hemoptysis, abdominal pain, melena, hematochezia, severe indigestion/heartburn, hematuria, incontinence, suspicious skin lesions, transient blindness, depression, unusual weight change, abnormal bleeding, enlarged lymph nodes, and angioedema.     Objective:   Physical Exam      WD, Obese, 72 y/o BF in  NAD... c/o pain in her left lat chest wall> no rash present. GENERAL:  Alert & oriented; pleasant & cooperative... HEENT:  [place]/AT, EOM-wnl, PERRLA, EACs-bilat cerumen, TMs-wnl, NOSE-clear, THROAT-clear & wnl. NECK:  Supple w/ fairROM; no JVD; normal carotid impulses w/o bruits; no thyromegaly or nodules palpated; no lymphadenopathy. CHEST:  Clear to P & A; without wheezes/ rales/ or rhonchi... +trigger points along trapezius area and chest wall. HEART:  Regular Rhythm; without murmurs/ rubs/ or gallops heard... +tender to palp. ABDOMEN:  Obese, soft & nontender; normal bowel sounds; no organomegaly or masses detected. EXT: without deformities, mild arthritic changes; no varicose veins/ +venous insuffic/ tr edema. NEURO:  CN's intact;  no focal neuro deficits... DERM:  no shingles rash found...  RADIOLOGY DATA:  Reviewed in the EPIC EMR & discussed w/ the patient... CXR 4/13 showed cardiomeg,  calcif Ao, incr markings, no nodule seen, NAD...  LABORATORY DATA:  Reviewed in the EPIC EMR & discussed w/ the patient... LABS 4/13:  FLP-at goals on Lip10;  Chems- ok x BS=151 A1c=8.2;  CBC-ok;  TSH=1.30;  BNP=42;     Assessment & Plan:    Allergic Rhinitis>  She is on max meds for her chr rhinitis & has been eval by Barbaraann Faster w/ neg CT Sinus...  ASTHMA, Bronchiectasis, LLL nodule>  She refuses to take meds regularly & is encouraged to use the Nebulizer Bid, Mucinex Bid, drink plenty of fluids, etc... She will call for any breathing problems and will require OV for assessment...  HBP>  Control is adeq but her regimen but I bet compliance is poor; encouraged to take all meds regularly & bring all med bottles to the OV for review...  CAD>  On Imdur & Plavix in addition to above BP meds; followed by [facility] & felt to have mostly CWP from her FM & no signif angina; CATH confirmed nonobstructive CAD elsewhere, patent LAD stent, med Rx...  CHOL>  Stable on Lipitor 3m/d, but it would be great to lose some  weight...  DM>  DM control is fair on Metform & Glimep; Labs showed A1c=7.5 & advised to lose wt...  OBESITY>  Wt up to 217# and we discussed wt reduction strategies...  GI> GERD on Protonix463md;  Ulc colitis on Asacol & Canasa followed by DrStark...  GU> followed by DrWrenn and stable as well...  DJD/ FM/ LBP>  Followed by DrVoytek on Celebrex, Parafon, and Lorcet; she is s/p 2 ESI's w/ improvement.  Vit D Defic>  She switched to OTC vit d supplement taking 5000 u daily...  Anxiety> on alprazolam Tid prn...  Anemia> stable off her Fe supplement at present...   Patient's Medications  New Prescriptions   CEFUROXIME (CEFTIN) 500 MG TABLET    Take 1 tablet (500 mg total) by mouth 2 (two) times daily with a meal.   HYDROCODONE-HOMATROPINE (HYCODAN) 5-1.5 MG/5ML SYRUP    Take 1 tsp every 6 hours as needed for cough  Previous Medications   ACETAMINOPHEN (TYLENOL) 500 MG TABLET    Take 500 mg by mouth every 6 (six) hours as needed.   ALBUTEROL (PROVENTIL) (2.5 MG/3ML) 0.083% NEBULIZER SOLUTION    Take 2.5 mg by nebulization 3 (three) times daily as needed.   ALPRAZOLAM (XANAX) 0.5 MG TABLET    TAKE 1 TABLET 3 TIMES A DAY AS NEEDED   AMLODIPINE (NORVASC) 10 MG TABLET    TAKE 1 TABLET BY MOUTH EVERY DAY   ATENOLOL (TENORMIN) 50 MG TABLET    TAKE 1 TABLET BY MOUTH EVERY DAY   BLOOD GLUCOSE MONITORING SUPPL (ACCU-CHEK AVIVA PLUS) W/DEVICE KIT    1 Device by Does not apply route daily. Check blood sugar once daily---DX  250.00   CHOLECALCIFEROL (VITAMIN D-3) 5000 UNITS TABS    Take 5,000 Units by mouth daily.     CLOPIDOGREL (PLAVIX) 75 MG TABLET    TAKE 1 TABLET BY MOUTH EVERY DAY   FLUTICASONE (FLONASE) 50 MCG/ACT NASAL SPRAY    Place 2 sprays into the nose at bedtime.   FUROSEMIDE (LASIX) 40 MG TABLET    Take 40 mg by mouth 3 (three) times daily.   HYDROCORTISONE (ANUSOL-HC) 2.5 % RECTAL CREAM    Use small amount to the rectal area 3-4 times daily.   HYDROCORTISONE-PRAMOXINE (ANALPRAM-HC)  2.5-1 % RECTAL CREAM    Place rectally as needed. APPLY  A SMALL AMOUNT TO AFFECTED AREA.   HYDROXYZINE (ATARAX/VISTARIL) 25 MG TABLET    TAKE 1 TABLET BY MOUTH EVERY 4 HOURS AS NEEDED FOR ITCHING   ISOSORBIDE MONONITRATE (IMDUR) 60 MG 24 HR TABLET    TAKE 1 TABLET (60 MG TOTAL) BY MOUTH DAILY.   LOSARTAN (COZAAR) 50 MG TABLET    Take 1 tablet (50 mg total) by mouth daily.   MESALAMINE (LIALDA) 1.2 G EC TABLET    Take 2 tablets (2.4 g total) by mouth daily with breakfast. Take 2.4 gm. daily   MESALAMINE (ROWASA) 4 G ENEMA    Place 60 mLs (4 g total) rectally at bedtime.   MONTELUKAST (SINGULAIR) 10 MG TABLET    TAKE 1 TABLET AT BEDTIME   NABUMETONE (RELAFEN) 750 MG TABLET    TAKE 1 TABLET BY MOUTH TWICE A DAY WITH A MEAL   NAFTIFINE HCL (NAFTIN) 2 % CREA    Apply topically 2 (two) times daily.   NEOMYCIN-POLYMYXIN-HYDROCORTISONE (CORTISPORIN) OTIC SOLUTION    Use 1 drop in affected ear three times daily as needed   NITROSTAT 0.4 MG SL TABLET    1 TABLET UNDER TONGUE AT ONSET OF CHEST PAIN YOU MAY REPEAT EVERY 5 MINUTES FOR UP TO 3 DOSES.   NYSTATIN (MYCOSTATIN) 100000 UNIT/ML SUSPENSION    GARGLE & SWALLOW 1 TEASPOONFUL 4 TIMES A DAY   PANTOPRAZOLE (PROTONIX) 40 MG TABLET    TAKE 1 TABLET BY MOUTH DAILY AT 12 NOON.   PROMETHAZINE (PHENERGAN) 25 MG TABLET    Take 25 mg by mouth 2 (two) times daily as needed. For nausea   TIZANIDINE (ZANAFLEX) 4 MG CAPSULE    Take 1 capsule (4 mg total) by mouth 3 (three) times daily as needed for muscle spasms.  Modified Medications   Modified Medication Previous Medication   ACCU-CHEK FASTCLIX LANCETS MISC ACCU-CHEK FASTCLIX LANCETS MISC      CHECK BLOOD SUGAR ONCE DAILY DX 250.00    1 Device by Does not apply route daily. Check blood sugar once daily  Dx   250.00   ALBUTEROL (PROVENTIL) (2.5 MG/3ML) 0.083% NEBULIZER SOLUTION albuterol (PROVENTIL) (2.5 MG/3ML) 0.083% nebulizer solution      TAKE 3 MLS (2.5 MG TOTAL) BY NEBULIZATION 3 (THREE) TIMES DAILY AS NEEDED  FOR WHEEZING.    Take 3 mLs (2.5 mg total) by nebulization 3 (three) times daily as needed for wheezing.   ATORVASTATIN (LIPITOR) 10 MG TABLET atorvastatin (LIPITOR) 10 MG tablet      TAKE 1 TABLET EVERY DAY    TAKE 1 TABLET EVERY DAY   CEFDINIR (OMNICEF) 300 MG CAPSULE cefdinir (OMNICEF) 300 MG capsule      Take 1 capsule (300 mg total) by mouth 2 (two) times daily.    Take 1 capsule (300 mg total) by mouth 2 (two) times daily.   FUROSEMIDE (LASIX) 40 MG TABLET furosemide (LASIX) 40 MG tablet      TAKE 1 TABLET BY MOUTH 3 TIMES A DAY    TAKE 1 TABLET BY MOUTH 3 TIMES A DAY   GLIMEPIRIDE (AMARYL) 1 MG TABLET glimepiride (AMARYL) 1 MG tablet      TAKE 1 TABLET (1 MG TOTAL) BY MOUTH DAILY BEFORE BREAKFAST.    Take 1 tablet (1 mg total) by mouth daily before breakfast.   HYDROCODONE-ACETAMINOPHEN (NORCO/VICODIN) 5-325 MG PER TABLET HYDROcodone-acetaminophen (NORCO/VICODIN) 5-325 MG per tablet      TAKE 1/2 TO 1 TABLET THREE TIMES A DAY AS NEEDED FOR PAIN  TAKE 1/2 TO 1 TABLET THREE TIMES A DAY AS NEEDED FOR PAIN   KLOR-CON M20 20 MEQ TABLET potassium chloride SA (K-DUR,KLOR-CON) 20 MEQ tablet      TAKE 1 TABLET (20 MEQ TOTAL) BY MOUTH 2 (TWO) TIMES DAILY.    Take 1 tablet (20 mEq total) by mouth 2 (two) times daily.   LIDODERM 5 % lidocaine (LIDODERM) 5 %      APPLY 1 PATCH TO SKIN FOR 12 HOURS THEN REMOVE AND DISCARD PATCH WITHIN 72 HOURS OR AS DIRECTED    Use patch for 12 hours then Remove & Discard patch within 12 hours or as directed by MD   MAGNESIUM OXIDE (MAG-OX) 400 (241.3 MG) MG TABLET magnesium oxide (MAG-OX) 400 MG tablet      TAKE 1 TABLET (400 MG TOTAL) BY MOUTH DAILY.    Take 1 tablet (400 mg total) by mouth daily.   METFORMIN (GLUCOPHAGE) 500 MG TABLET metFORMIN (GLUCOPHAGE) 500 MG tablet      TAKE 1 TABLET (500 MG TOTAL) BY MOUTH 2 (TWO) TIMES DAILY WITH A MEAL.    Take 1 tablet (500 mg total) by mouth 2 (two) times daily with a meal.   MONTELUKAST (SINGULAIR) 10 MG TABLET  montelukast (SINGULAIR) 10 MG tablet      TAKE 1 TABLET AT BEDTIME    TAKE 1 TABLET AT BEDTIME   VENTOLIN HFA 108 (90 BASE) MCG/ACT INHALER VENTOLIN HFA 108 (90 BASE) MCG/ACT inhaler      TAKE 1 TO 2 PUFFS EVERY 6 HOURS AS NEEDED FOR WHEEZING    USE 1- 2 SPRAYS EVERY 6 HOURS AS NEEDED FOR WHEEZING  Discontinued Medications   BUDESONIDE (UCERIS) 9 MG TB24    Take 9 mg by mouth daily.   CEFADROXIL (DURICEF) 500 MG CAPSULE    Take 1 capsule (500 mg total) by mouth 2 (two) times daily.   CEFDINIR (OMNICEF) 300 MG CAPSULE    Take 1 capsule (300 mg total) by mouth 2 (two) times daily.

## 2013-02-16 ENCOUNTER — Other Ambulatory Visit: Payer: Self-pay | Admitting: Cardiology

## 2013-02-17 ENCOUNTER — Telehealth: Payer: Self-pay | Admitting: Pulmonary Disease

## 2013-02-17 MED ORDER — CEFDINIR 300 MG PO CAPS: 300.0000 mg | ORAL_CAPSULE | Freq: Two times a day (BID) | ORAL | Status: DC

## 2013-02-17 MED ORDER — HYDROCODONE-HOMATROPINE 5-1.5 MG/5ML PO SYRP: ORAL_SOLUTION | ORAL | Status: DC

## 2013-02-17 NOTE — Telephone Encounter (Signed)
Called, spoke with pt.  Informed her of below per Dr. Lenna Gilford.  She verbalized understanding and is to call if symptoms do not improve or worsen.  Pt is aware abs was sent to pharm and hycodan rx is at front for pick up.  When printed hycodan rx, received override as pt is allergic to codiene, tramadol, and oxycodone.  I spoke with pt.  She has taken vicodin tabs in the past with not problem.  Spoke with Dr. Lenna Gilford.  Informed him of above.  He is ok with proceeding with hycodan rx.

## 2013-02-17 NOTE — Telephone Encounter (Signed)
Per SN---  We need to ask the pt what she can take---our list says she is allergic to everything.  thanks

## 2013-02-17 NOTE — Telephone Encounter (Signed)
Called, spoke with pt.  Pt states she has taken cefdinir 300 mg in the past.  She is also requesting cough syrup rx.

## 2013-02-17 NOTE — Telephone Encounter (Signed)
Called, spoke with pt -  C/o chest congestion, nonprod cough, wheezing, and increased SOB with walking.  Cough started since last OV but worsened over the weekend.  No chest tightness or f/c/s.  Pt is taking mucinex bid and using albuterol neb tid.  She is requesting abx.  [facility].  Dr. Lenna Gilford, pls advise.  Thank you.  Last OV with SN: 02/10/13  Allergies verified with pt: Allergies  Allergen Reactions  . Doxycycline Other (See Comments)    Unsteady gait  . Amoxicillin   . Aspirin Nausea And Vomiting  . Azithromycin     REACTION: pt states "ZPak doesn't work"  . Ciprofloxacin Nausea And Vomiting  . Codeine Nausea And Vomiting  . Levofloxacin     REACTION: chest pain  . Oxycodone-Acetaminophen     REACTION: vomiting  . Sulfamethizole   . Tramadol Hcl     REACTION: pt states "spaced-out"

## 2013-02-17 NOTE — Telephone Encounter (Signed)
Per SN---  Earl Lagos  1 tsp every 6 hours as needed for cough Cefdinir 300 mg  Po bid  #14.

## 2013-02-23 ENCOUNTER — Other Ambulatory Visit: Payer: Self-pay | Admitting: Pulmonary Disease

## 2013-03-02 DIAGNOSIS — H1030 Unspecified acute conjunctivitis, unspecified eye: Secondary | ICD-10-CM | POA: Diagnosis not present

## 2013-03-04 DIAGNOSIS — M76899 Other specified enthesopathies of unspecified lower limb, excluding foot: Secondary | ICD-10-CM | POA: Diagnosis not present

## 2013-03-06 ENCOUNTER — Other Ambulatory Visit: Payer: Self-pay | Admitting: Pulmonary Disease

## 2013-03-10 ENCOUNTER — Other Ambulatory Visit: Payer: Self-pay | Admitting: Pulmonary Disease

## 2013-03-17 ENCOUNTER — Other Ambulatory Visit: Payer: Self-pay | Admitting: Cardiology

## 2013-03-27 ENCOUNTER — Other Ambulatory Visit: Payer: Self-pay | Admitting: Pulmonary Disease

## 2013-03-30 ENCOUNTER — Telehealth: Payer: Self-pay | Admitting: Pulmonary Disease

## 2013-03-30 ENCOUNTER — Other Ambulatory Visit (INDEPENDENT_AMBULATORY_CARE_PROVIDER_SITE_OTHER): Payer: Medicare Other

## 2013-03-30 DIAGNOSIS — R300 Dysuria: Secondary | ICD-10-CM

## 2013-03-30 LAB — URINALYSIS, ROUTINE W REFLEX MICROSCOPIC
Bilirubin Urine: NEGATIVE
Hgb urine dipstick: NEGATIVE
Ketones, ur: NEGATIVE
Leukocytes, UA: NEGATIVE
Specific Gravity, Urine: 1.02 (ref 1.000–1.030)
Urobilinogen, UA: 1 (ref 0.0–1.0)

## 2013-03-30 NOTE — Telephone Encounter (Signed)
Orders placed and pt is aware. [place] Bing, CMA

## 2013-03-30 NOTE — Telephone Encounter (Signed)
Per SN---  Ok for the ua and the urine culture today.  thanks

## 2013-03-30 NOTE — Telephone Encounter (Signed)
I called and spoke with pt. She reports early Friday night she noticed a bladder infection. She has burning sensation, hard to urinate but having frequent urination. She is wanting to come in and leave UA. She has been taking OTC AZO Please advise SN thanks  Allergies  Allergen Reactions  . Doxycycline Other (See Comments)    Unsteady gait  . Amoxicillin   . Aspirin Nausea And Vomiting  . Azithromycin     REACTION: pt states "ZPak doesn't work"  . Ciprofloxacin Nausea And Vomiting  . Codeine Nausea And Vomiting  . Levofloxacin     REACTION: chest pain  . Oxycodone-Acetaminophen     REACTION: vomiting  . Sulfamethizole   . Tramadol Hcl     REACTION: pt states "spaced-out"

## 2013-03-31 LAB — URINE CULTURE

## 2013-04-06 ENCOUNTER — Other Ambulatory Visit: Payer: Self-pay | Admitting: Pulmonary Disease

## 2013-04-06 ENCOUNTER — Telehealth: Payer: Self-pay | Admitting: Pulmonary Disease

## 2013-04-06 MED ORDER — CEFDINIR 300 MG PO CAPS: 300.0000 mg | ORAL_CAPSULE | Freq: Two times a day (BID) | ORAL | Status: DC

## 2013-04-06 NOTE — Telephone Encounter (Signed)
i called and spoke with pt. She c/o dry cough, chest congestion, wheezing, chest tx, hoarseness x last week. She has been using MMW, cough syrup and mucinex. She is requesting cefdinir. Please advise Dr. Lenna Gilford thanks   Allergies  Allergen Reactions  . Doxycycline Other (See Comments)    Unsteady gait  . Amoxicillin   . Aspirin Nausea And Vomiting  . Azithromycin     REACTION: pt states "ZPak doesn't work"  . Ciprofloxacin Nausea And Vomiting  . Codeine Nausea And Vomiting  . Levofloxacin     REACTION: chest pain  . Oxycodone-Acetaminophen     REACTION: vomiting  . Sulfamethizole   . Tramadol Hcl     REACTION: pt states "spaced-out"

## 2013-04-06 NOTE — Telephone Encounter (Signed)
Per SN--  cefninir 300 mg  1 po bid  #20 Align once daily

## 2013-04-06 NOTE — Telephone Encounter (Signed)
Pt aware of recs and rx sent. Nothing further needed

## 2013-04-09 ENCOUNTER — Other Ambulatory Visit: Payer: Self-pay | Admitting: Pulmonary Disease

## 2013-04-13 ENCOUNTER — Telehealth: Payer: Self-pay | Admitting: Pulmonary Disease

## 2013-04-13 MED ORDER — HYDROCODONE-ACETAMINOPHEN 5-325 MG PO TABS: ORAL_TABLET | ORAL | Status: DC

## 2013-04-13 NOTE — Telephone Encounter (Signed)
rx has been signed by SN and pt has picked this rx up. Nothing further is needed.

## 2013-04-13 NOTE — Telephone Encounter (Signed)
rx last filled on 01/2013.  rx has been printed out and placed on SN cart.  Will call pt once this is ready to be picked up.

## 2013-05-08 ENCOUNTER — Telehealth: Payer: Self-pay | Admitting: Pulmonary Disease

## 2013-05-08 MED ORDER — CEFUROXIME AXETIL 500 MG PO TABS: 500.0000 mg | ORAL_TABLET | Freq: Two times a day (BID) | ORAL | Status: DC

## 2013-05-08 NOTE — Telephone Encounter (Signed)
Ceftin 530m Twice daily  #14  mucinex DM Twice daily  As needed  Cough/congesitoni  If not better will need ov.  Please contact office for sooner follow up if symptoms do not improve or worsen or seek emergency care

## 2013-05-08 NOTE — Telephone Encounter (Signed)
I called and spoke with pt. She c/o chest congestion, dry cough, throat feels congested, wheezing, chest tx, PND, nasal congestion, chills, sweats, slight body aches x tuesday. Denies any fever, no nausea, no vomiting. She has been taking OTC mucinex/mucinex DM, OTC antihistamine, using flonase. Please advise TP thanks  Allergies  Allergen Reactions  . Doxycycline Other (See Comments)    Unsteady gait  . Amoxicillin   . Aspirin Nausea And Vomiting  . Azithromycin     REACTION: pt states "ZPak doesn't work"  . Ciprofloxacin Nausea And Vomiting  . Codeine Nausea And Vomiting  . Levofloxacin     REACTION: chest pain  . Oxycodone-Acetaminophen     REACTION: vomiting  . Sulfamethizole   . Tramadol Hcl     REACTION: pt states "spaced-out"

## 2013-05-08 NOTE — Telephone Encounter (Signed)
Pt aware of recs. RX sent. Nothing further needed

## 2013-05-15 ENCOUNTER — Other Ambulatory Visit: Payer: Self-pay | Admitting: Cardiology

## 2013-05-20 ENCOUNTER — Other Ambulatory Visit: Payer: Self-pay | Admitting: Pulmonary Disease

## 2013-05-25 ENCOUNTER — Other Ambulatory Visit: Payer: Self-pay | Admitting: Pulmonary Disease

## 2013-06-05 DIAGNOSIS — E119 Type 2 diabetes mellitus without complications: Secondary | ICD-10-CM | POA: Diagnosis not present

## 2013-06-05 DIAGNOSIS — H25099 Other age-related incipient cataract, unspecified eye: Secondary | ICD-10-CM | POA: Diagnosis not present

## 2013-06-05 DIAGNOSIS — H11159 Pinguecula, unspecified eye: Secondary | ICD-10-CM | POA: Diagnosis not present

## 2013-06-11 ENCOUNTER — Other Ambulatory Visit: Payer: Self-pay | Admitting: Pulmonary Disease

## 2013-06-14 ENCOUNTER — Other Ambulatory Visit: Payer: Self-pay | Admitting: Pulmonary Disease

## 2013-06-15 ENCOUNTER — Other Ambulatory Visit: Payer: Self-pay | Admitting: Pulmonary Disease

## 2013-06-16 ENCOUNTER — Other Ambulatory Visit: Payer: Self-pay

## 2013-06-16 MED ORDER — ATORVASTATIN CALCIUM 10 MG PO TABS: ORAL_TABLET | ORAL | Status: DC

## 2013-06-19 ENCOUNTER — Other Ambulatory Visit: Payer: Self-pay

## 2013-06-19 MED ORDER — FUROSEMIDE 40 MG PO TABS
40.0000 mg | ORAL_TABLET | Freq: Three times a day (TID) | ORAL | Status: DC
Start: 2013-06-19 — End: 2013-08-21

## 2013-06-22 ENCOUNTER — Other Ambulatory Visit: Payer: Self-pay | Admitting: Physician Assistant

## 2013-06-22 ENCOUNTER — Telehealth: Payer: Self-pay | Admitting: *Deleted

## 2013-06-22 MED ORDER — HYDROCODONE-ACETAMINOPHEN 5-325 MG PO TABS: ORAL_TABLET | ORAL | Status: DC

## 2013-06-22 NOTE — Telephone Encounter (Signed)
Pt requesting a refill of her hydrocodone.  This has been printed out and signed by SN and give to the pt.  Pt is aware that she will need to find new primary care doctor since SN will no longer be doing primary care after April 1.

## 2013-06-26 DIAGNOSIS — M47812 Spondylosis without myelopathy or radiculopathy, cervical region: Secondary | ICD-10-CM | POA: Diagnosis not present

## 2013-06-26 DIAGNOSIS — M5030 Other cervical disc degeneration, unspecified cervical region: Secondary | ICD-10-CM | POA: Diagnosis not present

## 2013-06-27 ENCOUNTER — Other Ambulatory Visit: Payer: Self-pay | Admitting: Physician Assistant

## 2013-07-03 ENCOUNTER — Other Ambulatory Visit: Payer: Self-pay | Admitting: Pulmonary Disease

## 2013-07-06 ENCOUNTER — Other Ambulatory Visit: Payer: Self-pay | Admitting: Nurse Practitioner

## 2013-07-06 ENCOUNTER — Other Ambulatory Visit: Payer: Self-pay | Admitting: Pulmonary Disease

## 2013-07-07 ENCOUNTER — Telehealth: Payer: Self-pay | Admitting: Pulmonary Disease

## 2013-07-07 MED ORDER — ALPRAZOLAM 0.5 MG PO TABS: 0.5000 mg | ORAL_TABLET | Freq: Three times a day (TID) | ORAL | Status: DC | PRN

## 2013-07-07 NOTE — Telephone Encounter (Signed)
Rx has been called in. Nothing further is needed.

## 2013-07-13 ENCOUNTER — Other Ambulatory Visit: Payer: Self-pay | Admitting: Pulmonary Disease

## 2013-07-16 ENCOUNTER — Telehealth: Payer: Self-pay | Admitting: Pulmonary Disease

## 2013-07-16 NOTE — Telephone Encounter (Signed)
ATC pt line busy x 4 wcb

## 2013-07-16 NOTE — Telephone Encounter (Signed)
Attempted to call the pharmacy back and was advised that i would need to talk to the pharmacist and that he was on a break and if i could call back in 15 mins.  Will try the pharmacy back later.

## 2013-07-17 ENCOUNTER — Other Ambulatory Visit: Payer: Self-pay | Admitting: Cardiology

## 2013-07-20 MED ORDER — ACCU-CHEK FASTCLIX LANCETS MISC: Status: DC

## 2013-07-20 NOTE — Telephone Encounter (Signed)
I spoke to the pharmacy and the pt needs a refill on her test strips. This needs to be sent electronically with a dx code. Rx sent.[place] Bing, CMA

## 2013-07-20 NOTE — Telephone Encounter (Signed)
appt to see dr s fry_[id number]  office 08/27/13_1  pt aware Joellen Jersey

## 2013-07-22 ENCOUNTER — Telehealth: Payer: Self-pay | Admitting: Pulmonary Disease

## 2013-07-22 MED ORDER — GLUCOSE BLOOD VI STRP: ORAL_STRIP | Status: DC

## 2013-07-22 NOTE — Telephone Encounter (Signed)
rx for the aviva test strips have been sent to the pharmacy.  Nothing further is needed

## 2013-08-03 ENCOUNTER — Encounter: Payer: Self-pay | Admitting: *Deleted

## 2013-08-03 ENCOUNTER — Telehealth: Payer: Self-pay | Admitting: Gastroenterology

## 2013-08-03 ENCOUNTER — Ambulatory Visit (INDEPENDENT_AMBULATORY_CARE_PROVIDER_SITE_OTHER): Payer: Medicare Other | Admitting: Nurse Practitioner

## 2013-08-03 ENCOUNTER — Other Ambulatory Visit: Payer: Medicare Other

## 2013-08-03 VITALS — BP 122/80 | HR 76 | Ht 61.0 in | Wt 216.2 lb

## 2013-08-03 DIAGNOSIS — K5190 Ulcerative colitis, unspecified, without complications: Secondary | ICD-10-CM | POA: Diagnosis not present

## 2013-08-03 DIAGNOSIS — R197 Diarrhea, unspecified: Secondary | ICD-10-CM | POA: Diagnosis not present

## 2013-08-03 MED ORDER — MESALAMINE 4 G RE ENEM: 4.0000 g | ENEMA | Freq: Every day | RECTAL | Status: DC

## 2013-08-03 MED ORDER — HYDROCORTISONE ACETATE 25 MG RE SUPP: RECTAL | Status: DC

## 2013-08-03 NOTE — Progress Notes (Signed)
     History of Present Illness:  Patient is a 73 year old female known to Dr. Fuller Plan for history of ulcerative colitis diagnosed 2008. Disease was quiescent  for several years on mesalamine. She did have flare-like symptoms July 2014, colonoscopy with biopsies at that time was actually negative for active colitis. No microscopic colitis, just some mild diverticulosis and hemorrhoids. Patient had been doing fairly well on lialda 4.8gms daily until a few weeks ago when everything she ate or drank "ran right through" her. She complains of postprandial diarrhea with blood / mucous. No abdominal pain or fevers. She has been having intermittent nausea and vomiting. No antibiotics since February.   Current Medications, Allergies, Past Medical History, Past Surgical History, Family History and Social History were reviewed in [employer] record.  Studies:   Colonoscopy July 2014 COLON FINDINGS: Mild diverticulosis was noted in the sigmoid colon.  The colon was otherwise normal. There was no diverticulosis,  inflammation, polyps or cancers unless previously stated. Multiple  random biopsies taken in the right and left colon. Retroflexed  views revealed small internal hemorrhoids. The time to cecum=2  minutes 41 seconds. Withdrawal time=9 minutes 28 seconds. The  scope was withdrawn and the procedure completed.  COMPLICATIONS: There were no complications.  ENDOSCOPIC IMPRESSION:  1. Mild diverticulosis was noted in the sigmoid colon  2. Small internal hemorrhoids  RECOMMENDATIONS:  1. Await pathology results  2. High fiber diet with liberal fluid intake.  3. Repeat Colonoscopy in 5 years.  4. Resume Plavix tomorrow  5. Uceris qod for 2 weeks then discontinue, Lialda 2.4 g qd long term, refills for 1 year    Physical Exam: General: Pleasant, well developed , black female in no acute distress Head: Normocephalic and atraumatic Eyes:  sclerae anicteric, conjunctiva pink    Ears: Normal auditory acuity Lungs: Clear throughout to auscultation Heart: Regular rate and rhythm Abdomen: Soft, non distended, non-tender. No masses, no hepatomegaly. Normal bowel sounds Rectal: on anoscopy there were inflamed internal hemorrhoids, mucosa looked pale, slightly edematous.  Musculoskeletal: Symmetrical with no gross deformities  Extremities: No edema  Neurological: Alert oriented x 4, grossly nonfocal Psychological:  Alert and cooperative. Normal mood and affect  Assessment and Recommendations:  21. 73 year old female with left sided UC maintained on lialda 4.8 grams / daily. No active disease on last colonoscopy with biopsy July 2014. Currently with 2-3 week history of nausea and loose stools containing blood / mucous. Bleeding likely from hemorrhoids.   Will obtain stool for c-diff.   Trial of Rowasa enemas at bedtime.   Continue Lialda 4.8gms / daily.   Will treat hemorrhoids with steroid suppositories.  Low fiber diet for next 5-7 days   Follow up with me in 2-3 weeks. She knows to call in interim if symptoms worsen / bleeding persists  Will call her with stool study results.   2. Multiple medical problems, on multiple medications including plavix.

## 2013-08-03 NOTE — Patient Instructions (Signed)
Follow up visit with [name] Savoy, NP was scheduled for 08-17-2013 at 3 pm   Please call the office if you are not feeling better after taking the Rowasa Enema, and Anusol Suppository.  Information given today on a low fiber diet, please follow that for one week.  We have sent the following medications to your pharmacy for you to pick up at your convenience: [place] physician has requested that you go to the basement for the following lab work before leaving today: C-Diff

## 2013-08-03 NOTE — Telephone Encounter (Signed)
Patient with a history of UC.  She reports one week of cramping, urgency, and bloody diarrhea.  She will come in today and see Tye Savoy RNP today at 3:00

## 2013-08-04 ENCOUNTER — Other Ambulatory Visit: Payer: Medicare Other

## 2013-08-04 DIAGNOSIS — R197 Diarrhea, unspecified: Secondary | ICD-10-CM | POA: Insufficient documentation

## 2013-08-04 DIAGNOSIS — K5190 Ulcerative colitis, unspecified, without complications: Secondary | ICD-10-CM

## 2013-08-05 LAB — CLOSTRIDIUM DIFFICILE BY PCR: CDIFFPCR: NOT DETECTED

## 2013-08-11 ENCOUNTER — Other Ambulatory Visit (INDEPENDENT_AMBULATORY_CARE_PROVIDER_SITE_OTHER): Payer: Medicare Other

## 2013-08-11 ENCOUNTER — Encounter: Payer: Self-pay | Admitting: Pulmonary Disease

## 2013-08-11 ENCOUNTER — Ambulatory Visit (INDEPENDENT_AMBULATORY_CARE_PROVIDER_SITE_OTHER): Payer: Medicare Other | Admitting: Pulmonary Disease

## 2013-08-11 VITALS — BP 132/84 | HR 61 | Temp 98.0°F | Ht 62.0 in | Wt 217.0 lb

## 2013-08-11 DIAGNOSIS — E119 Type 2 diabetes mellitus without complications: Secondary | ICD-10-CM | POA: Diagnosis not present

## 2013-08-11 DIAGNOSIS — E559 Vitamin D deficiency, unspecified: Secondary | ICD-10-CM

## 2013-08-11 DIAGNOSIS — J479 Bronchiectasis, uncomplicated: Secondary | ICD-10-CM

## 2013-08-11 DIAGNOSIS — K5190 Ulcerative colitis, unspecified, without complications: Secondary | ICD-10-CM

## 2013-08-11 DIAGNOSIS — M1990 Unspecified osteoarthritis, unspecified site: Secondary | ICD-10-CM

## 2013-08-11 DIAGNOSIS — D649 Anemia, unspecified: Secondary | ICD-10-CM

## 2013-08-11 DIAGNOSIS — I2510 Atherosclerotic heart disease of native coronary artery without angina pectoris: Secondary | ICD-10-CM

## 2013-08-11 DIAGNOSIS — IMO0001 Reserved for inherently not codable concepts without codable children: Secondary | ICD-10-CM

## 2013-08-11 DIAGNOSIS — E785 Hyperlipidemia, unspecified: Secondary | ICD-10-CM

## 2013-08-11 DIAGNOSIS — E669 Obesity, unspecified: Secondary | ICD-10-CM

## 2013-08-11 DIAGNOSIS — I10 Essential (primary) hypertension: Secondary | ICD-10-CM

## 2013-08-11 DIAGNOSIS — F411 Generalized anxiety disorder: Secondary | ICD-10-CM | POA: Diagnosis not present

## 2013-08-11 DIAGNOSIS — J45909 Unspecified asthma, uncomplicated: Secondary | ICD-10-CM

## 2013-08-11 DIAGNOSIS — D126 Benign neoplasm of colon, unspecified: Secondary | ICD-10-CM

## 2013-08-11 DIAGNOSIS — M549 Dorsalgia, unspecified: Secondary | ICD-10-CM

## 2013-08-11 DIAGNOSIS — K5730 Diverticulosis of large intestine without perforation or abscess without bleeding: Secondary | ICD-10-CM

## 2013-08-11 DIAGNOSIS — I428 Other cardiomyopathies: Secondary | ICD-10-CM

## 2013-08-11 DIAGNOSIS — J984 Other disorders of lung: Secondary | ICD-10-CM | POA: Diagnosis not present

## 2013-08-11 DIAGNOSIS — G8929 Other chronic pain: Secondary | ICD-10-CM

## 2013-08-11 DIAGNOSIS — K219 Gastro-esophageal reflux disease without esophagitis: Secondary | ICD-10-CM

## 2013-08-11 LAB — CBC WITH DIFFERENTIAL/PLATELET
BASOS PCT: 1.1 % (ref 0.0–3.0)
Basophils Absolute: 0.1 10*3/uL (ref 0.0–0.1)
Eosinophils Absolute: 0.3 10*3/uL (ref 0.0–0.7)
Eosinophils Relative: 2.1 % (ref 0.0–5.0)
HCT: 38 % (ref 36.0–46.0)
HEMOGLOBIN: 12.5 g/dL (ref 12.0–15.0)
LYMPHS PCT: 16.2 % (ref 12.0–46.0)
Lymphs Abs: 1.9 10*3/uL (ref 0.7–4.0)
MCHC: 33 g/dL (ref 30.0–36.0)
MCV: 93.1 fl (ref 78.0–100.0)
Monocytes Absolute: 0.7 10*3/uL (ref 0.1–1.0)
Monocytes Relative: 6 % (ref 3.0–12.0)
NEUTROS ABS: 9 10*3/uL — AB (ref 1.4–7.7)
Neutrophils Relative %: 74.6 % (ref 43.0–77.0)
Platelets: 495 10*3/uL — ABNORMAL HIGH (ref 150.0–400.0)
RBC: 4.08 Mil/uL (ref 3.87–5.11)
RDW: 14.5 % (ref 11.5–14.6)
WBC: 12 10*3/uL — ABNORMAL HIGH (ref 4.5–10.5)

## 2013-08-11 LAB — BASIC METABOLIC PANEL
BUN: 14 mg/dL (ref 6–23)
CALCIUM: 9.3 mg/dL (ref 8.4–10.5)
CO2: 28 meq/L (ref 19–32)
CREATININE: 0.4 mg/dL (ref 0.4–1.2)
Chloride: 100 mEq/L (ref 96–112)
GFR: 186.13 mL/min (ref >60.00)
GLUCOSE: 143 mg/dL — AB (ref 70–99)
Potassium: 3.7 mEq/L (ref 3.5–5.1)
Sodium: 137 mEq/L (ref 135–145)

## 2013-08-11 LAB — TSH: TSH: 1.72 u[IU]/mL (ref 0.35–5.50)

## 2013-08-11 LAB — LIPID PANEL
Cholesterol: 136 mg/dL (ref 0–200)
HDL: 57 mg/dL (ref >39.00)
LDL Cholesterol: 65 mg/dL (ref 0–99)
TRIGLYCERIDES: 72 mg/dL (ref 0.0–149.0)
Total CHOL/HDL Ratio: 2
VLDL: 14.4 mg/dL (ref 0.0–40.0)

## 2013-08-11 LAB — HEPATIC FUNCTION PANEL
ALBUMIN: 4.2 g/dL (ref 3.5–5.2)
ALT: 11 U/L (ref 0–35)
AST: 16 U/L (ref 0–37)
Alkaline Phosphatase: 58 U/L (ref 39–117)
Bilirubin, Direct: 0.1 mg/dL (ref 0.0–0.3)
TOTAL PROTEIN: 7.7 g/dL (ref 6.0–8.3)
Total Bilirubin: 0.4 mg/dL (ref 0.3–1.2)

## 2013-08-11 LAB — HEMOGLOBIN A1C: Hgb A1c MFr Bld: 7.6 % — ABNORMAL HIGH (ref 4.6–6.5)

## 2013-08-11 LAB — IBC PANEL
Iron: 46 ug/dL (ref 42–145)
Saturation Ratios: 11.8 % — ABNORMAL LOW (ref 20.0–50.0)
TRANSFERRIN: 278.8 mg/dL (ref 212.0–360.0)

## 2013-08-11 LAB — SEDIMENTATION RATE: SED RATE: 21 mm/h (ref 0–22)

## 2013-08-11 MED ORDER — HYDROXYZINE HCL 25 MG PO TABS: ORAL_TABLET | ORAL | Status: DC

## 2013-08-11 MED ORDER — FIRST-DUKES MOUTHWASH MT SUSP: OROMUCOSAL | Status: DC

## 2013-08-11 MED ORDER — GLUCOSE BLOOD VI STRP: ORAL_STRIP | Status: DC

## 2013-08-11 NOTE — Progress Notes (Signed)
Subjective:    Patient ID: [name], female    DOB: 14-Apr-1941, 73 y.o.   MRN: [id number]  HPI 73 y/o BF here for a follow up visit... she has multiple medical problems as noted below...    ~  August 03, 2011:  50moROV & JPearleneis c/o sinusitis "blowing pieces of meat w/ blood in it", swollen glands, drainage, pain into ears, etc> we discussed Rx w/ Augmentin,Mucinex, MMW, Flonase, Saline, etc... Breathing is stable other than sinus symptoms & denies much cough, phlegm, ch in dyspnea;  Cardiac followed by DBenay Spice& Imdur incr to 662md & f/u Myoview was felt to be a neg study;  Chol stable on Lip10 but DM control worse on Metform alone & we are going to add Glimep1m73m;  She's had mod problem w/ LBP & saw Ortho w/ epidural steroid injections that have helped her pain...  CXR 4/13 showed cardiomeg, calcif Ao, incr markings, no nodule seen, NAD...  LABS 4/13:  FLP-at goals on Lip10;  Chems- ok x BS=151 A1c=8.2;  CBC-ok;  TSH=1.30;  BNP=42  ~  February 06, 2012:  21mo42mo & she has been hosp twice in the interval> 1) 7/28- 11/28/11 while visiting relative in ER she has a syncopal episode, believed to be vasovagal; w/u was neg- EKG, enz, monitor, 2DEcho showed DD, CDopplers, not orthostatic, K=3.2 repleted, BS=130-170 & A1c=7.1, Urine +pan-sens EColi and treated w/ Augmentin (she's allergic to cipro)...  Marland KitchenMarland Kitchen Adm 8/9 - 12/08/11 by Cards w/ CP for cath> showed patent LAD stent, mild CAD elsewhere, EF=65%; meds adjusted- see below, & Omep changed to PROTONIX40mg35mue to Plavix...    Chol looked good on Lip10 during her hosp stay...    She is not currently on Metformin (prev 500mgB27m/ the Amaryl1mgQam25msh3 doesn't know why & I presume stopped w/ contrast studies in hosp 7 never placed back on her med list at disch therefore lost; Labs today showed ==> she didn't go to lab as requested!    Her CC remains diffuse aches & pains related to her FM "it runs in the family"; she has Vicodin- allowed up to 3/d, and  Relafen 750Bid [facility] recent FM exac ppt by sitting on hard benches at a [employer] for heat, & we discussed trial LIDODERM patches...     She has a severe generalized anxiety disorder> on Xanax as needed up to 3/d...    We reviewed prob list, meds, xrays and labs> see below for updates >> she refuses the Flu shot; didn't bring med bottle or med list to office today...  ~  August 06, 2012:  21mo ROV88mooann isMichelynstated by the death of her mother last month; in addition she notes the pollen is bothering her early this spring...  We reviewed the following medical problems during today's office visit >>     Allergic Rhinitis> on OTC Antihist, Singulair, Flonase & nasal saline mist; c/o the early spring pollen, we reviewed Rx...    Asthma, Bronchiectasis, & LLL nodule on CT scan> she only takes her breathing meds as needed including Albuterol for NEB vs Proventil MDI, & she stopped Advair on her own; prob granuloma LLL (no change- see below)...    HBP & CAD> on Aten50, Amlodip10, Losar50, Lasix40-3/d, K20Bid, Imdur60, Plavix75; she has some CWP but no angina> BP today= 118/78 & she notes some atypCP, rare palpit, occas dizzy w/o syncope, +SOB/ DOE, & intermittent edema...Marland KitchenMarland Kitchen  Chol> FLP looks good on Lipitor 65m/d w/ TChol 126, TG 92, HDL 44, LDL 64; encouraged to lose some weight!    DM & Obesity> DM contol is adeq on Metform500Bid + Glimep184mam; labs 4/14 showed BS=147, A1c=7.2 & we decided same meds get wt down...    GI> GERD, Divertics, Hx UC, Polyps> she saw DrStark 10/13 & she was stable on her Omep4051m for reflux, Asacol 4.8g daily & Canasa suppos prn for her UC, & f/u colon overdue & she will call to set up...    GU> followed by DrWrenn as noted below w/ stress incont, hx UTIs, hx hematuria, & prob cyst left kidney which he continues to follow...    DJD, FM> managed by DrVoytek on Relafen, Lorcet, & Zanaflex + various other pain meds over the year...     Anxiety & Depression> Mother passed away 3/103/20/2024he takes Xanax 0.5mg33mn... We reviewed prob list, meds, xrays and labs> see below for updates >> she refuses the seasonal Flu vaccines;  Time spent 35 min...  LABS 4/14:  FLP- at goals on Lip10;  Chems- ok x BS=147, A1c=7.2, K=3.4, Mg=1.5;  CBC- wnl w/ Hg=12.3, MCV=92, Fe=59 (15%sat);  TSH=1.42;  VitD=39;  BNP=36...  ~  February 10, 2013:  59mo 69mo& [name] prob w/ her "allergies" despite Singulair, OTC antihist & Flonase; she has mult medical problems and an extensive medication list- of course she did not bring her bottles or list to the OV today...    Allergic Rhinitis> on OTC Antihist, Singulair, Flonase, Mucinex & nasal saline mist; we reviewed treatment...    Asthma, Bronchiectasis, & LLL nodule on CT scan> she only takes her breathing meds as needed including Albuterol for NEB vs Proventil MDI, & she stopped Advair on her own; prob granuloma LLL (no change- see below)...    HBP & CAD> on Aten50, Amlodip10, Losar50, Lasix40-3/d, K20Bid, Imdur60, Plavix75; she has some CWP but no angina> BP today= 118/62 & she notes some atypCP, rare palpit, occas dizzy w/o syncope, +SOB/ DOE, & intermittent edema...    Chol> on Lipitor 10mg/43m FLP 4/14 showing TChol 126, TG 92, HDL 44, LDL 64; encouraged to lose some weight!    DM & Obesity> DM contol is adeq on Metform500Bid + Glimep1mgQam31mabs 10/14 showed BS=131, A1c=7.5 & we decided same meds & get wt down...    GI> GERD, Divertics, Hx UC, Polyps> she saw DrStark 7/14 & she was stable on her PPI (Protonix40) for reflux, Asacol 2 daily & Canasa suppos prn for her UC, & f/u colon done 7/14 w/ sigm divertics, no polyps, no active colitis; continue meds, recheck 38yrs.  64yr> followed by DrWrenn as noted below w/ stress incont, hx UTIs, hx hematuria, & prob cyst left kidney which he continues to follow...    DJD, FM> managed by DrVoytek on Relafen, Vicodin, & Zanaflex + various other pain meds over the years;  she says DrV does "shots" as needed...    Anxiety & Depression> Mother passed away 3/14; sh03/20/24kes Xanax 0.5mg prn.52mWe reviewed prob list, meds, xrays and labs> see below for updates >> she refuses the 2014 Flu vaccine...  CXR 10/14 showed cardiomeg, mild peribronchial thickening, NAD, some thoracic spondylosis noted...  LABS 10/14:  Chems- ok x BS=131, A1c=7.5    ~  August 11, 2013:  59mo ROV &61monWest [place]outh[place] hoarse from all the pollen"- c/o nasal congestion, drainage (esp at night), itchy/ sneezy, & clear  mucous w/o f/c/s;  She is taking Singulair10, OTC antihist, & Flonase;  She also notes chest congestion & wheezing she says but her PFT today is restricted and NOT obstructed;  She has NEBS w/ albut that she uses prn + Hycodan for cough...     She is followed by Benay Spice for Cards- HBP, CAD, Hx CP; she notes "I deal w/ it" and BP= 132/84 on her Aten50, Amlod10, Losar50, Imdur60, Lasix (40Tid) and K20Bid...     GI managed by DrStark on numerous meds including: PPI (Protonix40) for reflux, Asacol 2Bid & Canasa suppos prn; also uses [place], Loperimide, and Phenergan as needed...     Lipids controlled on her Frannie 4/15 showed TChol 136, TG 72, HDL 57, LDL 65... Continue same.    DM managed on her Metform500Bid + Glim1mQam-  Labs 4/15 showed BS=143, A1c=7.6..Marland KitchenMarland KitchenWe could incr her meds but I'd prefer better diet 7 wt loss first...    DJD & FM managed by her chr pain doctor whom she identifies as DrVoytek on Vicodin, Lidoderm, Relafen, Zanaflex...    She remains under a lot of stress and takes Xanax0.559mprn... We reviewed prob list, meds, xrays and labs> see below for updates >>   LABS 4/15:  FLP- within parameters on Lip40;  Chems- ok x BS=143 A1c=7.6;  CBC- ok w/ Hg=12.5 Fe=46 (12%);  TSH=1.72;  VitD=29;  Sed=21...  PFT 4/15 showed FVC=1.34 (64%), FEV1=1.13 (69%), %1sec=84, and mid-flows= 97% predicted... c/w mod restriction and she is instructed to decrease the weight & incr exercise...             Problem List:               ALLERGY (ICD-995.3) - on Saline, FLONASE 2spBid, etc; she had a neg ENT eval by DrBarbaraann Faster1/13- chr rhinitis, she had a neg CT Sinus...  ASTHMA (ICD-493.90) - prev on Advair but she stopped on her own> she takes MUCINEX, NEBULIZER w/ Albut as needed & PROAIR Prn when out & about... she is an ex-smoker having quit in 1975, no recent exacerbations.  BRONCHIECTASIS (ICD-494.0) - CT Chest 3/10 showed bilat LL bronchiectasis L>R; advised to take meds regularly & avoid infections> NEBS, MUCINEX, Fluids, etc... ~  PFTs 4/15 showed FVC=1.34 (64%), FEV1=1.13 (69%), %1sec=84, and mid-flows= 97% predicted... c/w mod restriction and she is instructed to decrease the weight & incr exercise...  PULMONARY NODULE (ICD-518.89) - 1.4cm nodule in LLL showed up on Urology CTAbd 07/12/08>  CT Chest 07/22/08 confirmed 1.4cm peripheral LLL nodule that wasn't present on CT in 2005 & no other lesions, +bronchiectasis in both LLs L>R, mod cardiomegaly...  PET Scan 4/10 showed no abn hypermetabolic activity- therefore likely scar or granuloma... ~  CXR 2/11 showed cardiomeg, some COPD, NAD- LLL nodule seen on CT 3/10 is not vis on CXR. ~  CXR 1/12 in ER showed cardiomeg, vasc congestion, no overt edema, no nodule seen. ~  CXR 4/13 showed cardiomeg, calcif Ao, incr markings, no nodule seen, NAD...Marland Kitchen~  CXR 8/13 [place] borderline heart size, sl incr interstitial markings (mild edema), calcif in Ao, NAD...Marland Kitchen~  CXR 10/14 showed cardiomeg, mild peribronchial thickening, NAD, some thoracic spondylosis noted.  HYPERTENSION (ICD-401.9) - controlled on ATENOLOL 5068m, NORVASC 33m38m LOSARTAN 100mg74mLASIX 40mg-27mabs/d (2AM, 1PM), KCL 20Bid...  ~  9/12:  BP=138/80 today, not checking BP at home... denies HAs, visual changes, palipit, dizziness, syncope, dyspnea, edema, etc... ~  4/13:  BP= 126/74 &  she is relatively asymptomatic but has mult somatic complaints... ~  4/14:  on  Aten50, Amlodip10, Losar50, Lasix40-3/d, K20Bid, Imdur60, Plavix75; she has some CWP but no angina> BP today= 118/78 & she notes some atypCP, rare palpit, occas dizzy w/o syncope, +SOB/ DOE, & intermittent edema. ~  Note> 4/14 labs showed K=3.4, Mg=1.5, & she is requested to take the K20Bid & add Magox 400/d... ~  10/14: on Aten50, Amlodip10, Losar50, Lasix40-3/d, K20Bid, Imdur60, Plavix75; she has some CWP but no angina> BP today= 118/62 & she remains stable... ~  4/15: She is followed by Benay Spice for Cards- HBP, CAD, Hx CP; she notes "I deal w/ it" and BP= 132/84 on her Aten50, Amlod10, Losar50, Imdur60, Lasix (40Tid) and K20Bid  CORONARY ARTERY DISEASE (ICD-414.00) - on PLAVIX 68m/d (INTOL/ allergic to ASA) & IMDUR 678mdaily... ~  she is s/p PTCA & stent to the LAD on 10/02... ~  last cath 2/07 w/ patent LAD stent, non-obstructive dis otherwise, EF=65%... ~  last NuclearStressTest 5/08 showed no ischemia, EF=64% (similiar to 2005)... ~  hosp in Jun09 w/ CP- cath showing non-obstructive CAD w/ 25-40% lesions in all 3 vessels, prev stent in LAD patent, norm LVF.. ~  Baseline EKG shows NSR, minor NSSTTWA, NAD... ~  Last Cards f/u 12/12> Cad, stent in LAD, c/o freq atypCPs, IMDUR increased to 603m- encouraged to incr exercise. ~  f/u MYOVIEW 12/12 showed prob normal stress nuclear study w/ mild reversible defect in anterior wall likely due to breast attenuation- cannot completely exclude ischemia. ~  3/14:  She saw DrKatz for f/u CAD, DiastolicCHF, Hx syncope- she denied CP, no further dizziness; doing well on medical management, no changes made... ~  EKG 3/14 by DrKBenay Spiceowed NSR, rate73, ?RAE, borderline tracing...  CHEST PAIN, ATYPICAL (ICD-786.59) - she has chronic CWP, prob related to her fibromyalgia... ~  1/12:  she went to ER, then [facility] w/ left CWP- cardiac evals neg, treated w/ rest, heat, Vicodin, Tussionex, etc... [facility]  HYPERCHOLESTEROLEMIA (ICD-272.0)  - on LIPITOR 104m65m + diet Rx.. ~  FLP [place] showed TChol 132, TG 58, HDL 60, LDL 60 ~  FLP 7/09 showed TChol 116, TG 49, HDL 49, LDL 57 ~  FLP in hosp 3/10 showed TChol 130, TG 55, HDL 53, LDL 66 ~  FLP 2/11 on [place]5 showed TChol 104, TG 67, HDL 50, LDL 40 ~  Crestor 5mg 58mnged to Lipitor 104mg 42minsurance co. ~  FLP 3/12 on Lip10 showed TChol 139, TG 64, HDL 57, LDL 69 ~  FLP 9/12 on Lip10 showed TChol 126, TG 79, HDL 54, LDL 57 ~  FLP 4/13 on Lip10 showed TChol 131, TG 95, HDL 52, LDL 60 ~  FLP 8/13 on Lip10 showed TChol 121, TG 68, HDL 49, LDL 58 ~  FLP 4/14 on Lip10 showed TChol 126, TG 92, HDL 44, LDL 64 ~  FLP 4/15 on Lip10 showed TChol 136, TG 72, HDL 57, LDL 65... Continue same  DIABETES MELLITUS (ICD-250.00) - on METFORMIN 500mgBi80m low carb diet... ~  last labs 11/08 showed BS=120, HgA1c=6.2 ~  labs 6/09 in hosp showed BS=110-120, HgA1c= 6.6... sameMarland KitchenMarland Kitcheneds, needs better diet! ~  labs 3/10 in hosp showed BS= 113-155, HgA1c= 6.7 ~  labs 2/11 showed BS= 108, A1c= 7.1 ~  labs 1/12 in ER showed BS= 167... ~  labs 3/12 showed BS= 121, A1c= 7.2... contMarland KitchenMarland Kitchenue same meds, better diet! ~  Labs 9/12 showed BS= 131,  A1c= 6.9.Marland KitchenMarland Kitchen Continue Metform500Bid ~  Labs 4/13 showed BS= 151, A1c= 8.2.Marland KitchenMarland Kitchen Needs more meds> add GLIMEPIRIDE 55mQam ~  9/13:  She had neg Optometry f/u w/ DrMcFarland- no retinopathy, early cats noted... ~  10/13:  She is not currently on Metformin (prev 5071mid w/ the Amaryl1m71mm); she doesn't know why & I presume stopped w/ contrast studies in hosp & never placed back on her med list at disch therefore lost; Asked to restart Metformin 500m34m + Glimep1mg/53m ROV revcheck 3-80mo..848mo 4/14: DM contol is adeq on Metform500Bid + Glimep1mgQam22mabs 4/14 showed BS=147, A1c=7.2... ~  LMarland Kitchenbs 10/14 on Metform500Bid & Glim1mg sho53m  BS=131, A1c=7.5; same meds, needs better diet... ~  Labs 4/15 on Metform500Bid + Glim1mg show31mBS=143, A1c=7.6 and she needs sl incr meds but prefers diet  & exercise- get the weight down.  OBESITY (ICD-278.00)   ~  weight 11/09 = 204# ~  weight 3/10 = 208# ~  weight 2/11 = 205#... we reviewed diet + exercise program. ~  weight 1/12 = 208# ~  Weight 3/12 = 211#... We reviewed diet & exercise yet again. ~  Weight 9/12 = 216# ~  Weight 4/13 = 217#... We reviewed diet, exercise, wt reduction program. ~  Weight 10/13 = 213# ~  Weight 4/14 = 216# ~  Weight 10/14 = 217# ~  Weight 4/15 = 217#  GERD (ICD-530.81) - prev on Prilosec 20mg- tak55m1-2 daily but insurance won't pay- wanted Omeprazole 40mg/d- ok1mt Cards changed to PROTONIX 40[place] 56mlavix use...  DIVERTICULOSIS OF COLON (ICD-562.10) & COLONIC POLYPS (ICD-211.3) - last colonoscopy was 7/08 w/ divertics & colitis seen, + hyperplastic polyp removed... ~  1/13:  DrStark noted that she is due for f/u surveillance colon but she wanted to put it off; encouraged to call his office for this important procedure... ~  7/14: she finally had her f/u colon by DrStark> mild divertics in sigmoid, sm int hems, otherw neg; rec to continue hi fiber, Lialda2.4gm/d, & f/u 75yrs...  Hx 78yrLCERATIVE COLITIS (ICD-556.9) - followed by DrStark for GI- she has been on on Lialda, Rowasa, & Hydrocort enemas in the past... ~  seen 10/11 by DrStark w/ freq loose stools- Asacol incr to 4tabs Tid & CANASA suppos for 48mo. ~  Her m59moenance regimen is ASACOL 400mg- 2tabs Ti44mut med list from DrStark shows 4tabsTid) and CANASA suppos as needed... ~  10/13:  She had a GI f/u w/ DrStark> left sided UC w/ intermitt diarrhea, hems, hx adenomatous polyp; controlled on Asacol 4/d ~  7/14: she had GI f/u w/ AE for DrStark> hx left colon UC & prev ademoatous polyps; under control clinically & f/u colon 7/14 as above- no active colitis; rec to continue the Asacol2.4gm/d...  MICROSCOPIC HEMATURIA (ICD-599.72),  NEOPLASM, KIDNEY (ICD-239.5),  STRESS INCONTINENCE (ICD-788.39) - eval and Rx by DrWrenn 9/09 w/ stress  incontinence, dysuria w/ microscopic hematuria-  Cysto w/ bladder bx was neg; and CT + Sonar w/ 1.8cm left upper pole renal lesion of ?etiology- poss tumor vs cyst (min enhancement, solid on sonar w/ min blood flow + 2 sm cysts also seen)...  they are following a watchful waiting protocol & repeat CT 3/10 showed stable 1.7cm category II cyst upper pole of left kidney without change... f/u planned 51yr. ~  Renal S51yr 7/13 showed 2cm nodule (cyst) in upper pole of left kidney w/o change serially... ~  8/13:  She had f/u visit w/ DrWrenn> hx hemorrhagic  cystitis & treated w/ Septra, she was supposed to f/u w/ them...`  DEGENERATIVE JOINT DISEASE (ICD-715.90) >> she is followed by DrVoytek et al prev on Celebrex, Parafon, Lorcet... FIBROMYALGIA (ICD-729.1) >> now on VICODIN up to Tid & RELAFEN 772m Bid prn... LOW BACK PAIN >> prev shots from DrVoytek et al... ~  she was seen 12/11 w/ bilat foot pain & XRay showed arthritic changes- given DepoMedrol+Toradol shot IM. ~  11/12: seen by Ortho for back & leg pain> MRI showed lumbar spondylosis & mod central canal stenosis; given ESI 11/12 w/ 75% improvement in pain. ~  She reports that ESI was repeated 3/13 & further improved by her hx... ~  She identifies DrVoytek as her chronic pain doctor=> seen 10/13 & her note is reviewed...  VITAMIN D DEFICIENCY (ICD-268.9) - Vit D level 2/11= 11 and pt rec to take Vit D 50000 u weekly but ?never filled this Rx... therefore rec 8/11 to switch to OTC Vit D 5000 u daily from the health food store...  ~  Labs 3/12 showed Vit D level = 46 ~  Labs 4/14 showed Vit D levewl = 39 & she is rec to take the 5000u OTC Vit D supplement every day... ~  Labs 4/15 showed Vit D = 29 & remainded to take the vit D supplenment ~5000u daily...  ANXIETY (ICD-300.00) - she has lots of stress caring for Momma... on ALPRAZOLAM 0.538mTid Prn...  Hx of ANEMIA (ICD-285.9) - on NUIRON 15073m...  ~  labs 11/08 showed Hg=12.2, Fe=38/ TIBC=287/  sat=9%... ~  labs 3/10 in hosp showed Hg= 13.0, MCV= 92... ~  labs 2/11 showed Hg= 12.7, Fe= 67... ~  labs 1/12 in ER showed Hg= 11.7 ~  Labs 3/12 showed Hg= 12.3, MCV= 93 ~  Labs 9/12 showed Hg= 12.5, MCV= 94 ~  Labs 4/13 showed Hg= 12.7 ~  Labs 8/13 showed Hg= 11.5 ~  Labs 4/14 showed Hg= 12.3, MCV=92, Fe=59 (15%sat) & rec to continue daily iron supplement... ~  Labs 4/15 showed Hg= 12.5, MCV=93, Fe=46 (12%sat) & rec to restart FeSO4 daily...   Past Surgical History  Procedure Laterality Date  . Cholecystectomy    . Hand surgery  01/2006    Right Hand Surgery by Dr. GraAmedeo Plenty Coronary stent placement  2002    BMS to the LAD  . Abdominal hysterectomy    . Cardiac catheterization  11/2011  . Colonoscopy w/ biopsies  10/2012    Multiple  . Esophagogastroduodenoscopy  12/2008    Outpatient Encounter Prescriptions as of 08/11/2013  Medication Sig  . ACCU-CHEK AVIVA PLUS test strip CHECK BLOOD SUGAR ONCE A DAY OR AS DIRECTED  . ACCU-CHEK FASTCLIX LANCETS MISC CHECK BLOOD SUGAR ONCE DAILY DX 250.00  . acetaminophen (TYLENOL) 500 MG tablet Take 500 mg by mouth every 6 (six) hours as needed.  . aMarland Kitchenbuterol (PROVENTIL) (2.5 MG/3ML) 0.083% nebulizer solution TAKE 3 MLS (2.5 MG TOTAL) BY NEBULIZATION 3 (THREE) TIMES DAILY AS NEEDED FOR WHEEZING.  . ALPRAZolam (XANAX) 0.5 MG tablet Take 1 tablet (0.5 mg total) by mouth 3 (three) times daily as needed for anxiety.  . aMarland KitchenLODipine (NORVASC) 10 MG tablet TAKE 1 TABLET BY MOUTH EVERY DAY  . atenolol (TENORMIN) 50 MG tablet TAKE 1 TABLET BY MOUTH EVERY DAY  . atorvastatin (LIPITOR) 10 MG tablet TAKE 1 TABLET EVERY DAY *NO FURTHER REFILLS WITHOUT APPT*  . Cholecalciferol (VITAMIN D-3) 5000 UNITS TABS Take 5,000 Units by mouth daily.    .Marland Kitchen  clopidogrel (PLAVIX) 75 MG tablet TAKE 1 TABLET BY MOUTH EVERY DAY  . fluticasone (FLONASE) 50 MCG/ACT nasal spray PLACE 2 SPRAYS INTO THE NOSE AT BEDTIME.  . furosemide (LASIX) 40 MG tablet Take 1 tablet (40 mg total)  by mouth 3 (three) times daily.  Marland Kitchen glimepiride (AMARYL) 1 MG tablet TAKE 1 TABLET (1 MG TOTAL) BY MOUTH DAILY BEFORE BREAKFAST.  Marland Kitchen glucose blood (ACCU-CHEK AVIVA) test strip Test blood sugar daily  . HYDROcodone-acetaminophen (NORCO/VICODIN) 5-325 MG per tablet TAKE 1/2 TO 1 TABLET THREE TIMES A DAY AS NEEDED FOR PAIN  . HYDROcodone-homatropine (HYCODAN) 5-1.5 MG/5ML syrup Take 1 tsp every 6 hours as needed for cough  . hydrocortisone (ANUSOL-HC) 2.5 % rectal cream Use small amount to the rectal area 3-4 times daily.  . hydrocortisone (ANUSOL-HC) 25 MG suppository Take for a week in the morning  . hydrOXYzine (ATARAX/VISTARIL) 25 MG tablet TAKE 1 TABLET BY MOUTH EVERY 4 HOURS AS NEEDED FOR ITCHING  . isosorbide mononitrate (IMDUR) 60 MG 24 hr tablet TAKE 1 TABLET BY MOUTH DAILY.  Marland Kitchen KLOR-CON M20 20 MEQ tablet TAKE 1 TABLET (20 MEQ TOTAL) BY MOUTH 2 (TWO) TIMES DAILY.  Marland Kitchen LIALDA 1.2 G EC tablet TAKE 2 TABS TWICE DAILY  . LIDODERM 5 % APPLY 1 PATCH TO SKIN FOR 12 HOURS THEN REMOVE AND DISCARD PATCH WITHIN 72 HOURS OR AS DIRECTED  . Loperamide-Simethicone (IMODIUM MULTI-SYMPTOM RELIEF) 2-125 MG TABS Take 1 tablet by mouth as needed.  Marland Kitchen losartan (COZAAR) 50 MG tablet TAKE 1 TABLET (50 MG TOTAL) BY MOUTH DAILY.  . magnesium oxide (MAG-OX) 400 (241.3 MG) MG tablet TAKE 1 TABLET (400 MG TOTAL) BY MOUTH DAILY.  . mesalamine (ROWASA) 4 G enema Place 60 mLs (4 g total) rectally at bedtime.  . metFORMIN (GLUCOPHAGE) 500 MG tablet TAKE 1 TABLET (500 MG TOTAL) BY MOUTH 2 (TWO) TIMES DAILY WITH A MEAL.  . montelukast (SINGULAIR) 10 MG tablet TAKE 1 TABLET AT BEDTIME  . nabumetone (RELAFEN) 750 MG tablet TAKE 1 TABLET BY MOUTH TWICE A DAY WITH A MEAL  . Naftifine HCl (NAFTIN) 2 % CREA Apply topically 2 (two) times daily.  Marland Kitchen neomycin-polymyxin-hydrocortisone (CORTISPORIN) otic solution Use 1 drop in affected ear three times daily as needed  . NITROSTAT 0.4 MG SL tablet 1 TABLET UNDER TONGUE AT ONSET OF CHEST PAIN  YOU MAY REPEAT EVERY 5 MINUTES FOR UP TO 3 DOSES.  Marland Kitchen nystatin (MYCOSTATIN) 100000 UNIT/ML suspension GARGLE & SWALLOW 1 TEASPOONFUL 4 TIMES A DAY  . pantoprazole (PROTONIX) 40 MG tablet TAKE 1 TABLET BY MOUTH DAILY AT 12 NOON.  Marland Kitchen promethazine (PHENERGAN) 25 MG tablet Take 25 mg by mouth 2 (two) times daily as needed. For nausea  . tiZANidine (ZANAFLEX) 4 MG tablet TAKE 1 CAPSULE (4 MG TOTAL) BY MOUTH 3 (THREE) TIMES DAILY AS NEEDED FOR MUSCLE SPASMS.  . VENTOLIN HFA 108 (90 BASE) MCG/ACT inhaler TAKE 1 TO 2 PUFFS EVERY 6 HOURS AS NEEDED FOR WHEEZING  . mesalamine (ROWASA) 4 G enema Place 60 mLs (4 g total) rectally at bedtime.  . [DISCONTINUED] Blood Glucose Monitoring Suppl (ACCU-CHEK AVIVA PLUS) W/DEVICE KIT 1 Device by Does not apply route daily. Check blood sugar once daily---DX  250.00    Allergies  Allergen Reactions  . Doxycycline Other (See Comments)    Unsteady gait  . Amoxicillin   . Aspirin Nausea And Vomiting  . Azithromycin     REACTION: pt states "ZPak doesn't work"  . Ciprofloxacin  Nausea And Vomiting  . Codeine Nausea And Vomiting  . Levofloxacin     REACTION: chest pain  . Oxycodone-Acetaminophen     REACTION: vomiting  . Sulfamethizole   . Tramadol Hcl     REACTION: pt states "spaced-out"    Current Medications, Allergies, Past Medical History, Past Surgical History, Family History, and Social History were reviewed in [employer] record.    Review of Systems         See HPI - all other systems neg except as noted...  The patient complains of weight gain, decreased hearing, chest pain, dyspnea on exertion, muscle weakness, and difficulty walking.  The patient denies anorexia, fever, weight loss, vision loss, hoarseness, syncope, peripheral edema, prolonged cough, headaches, hemoptysis, abdominal pain, melena, hematochezia, severe indigestion/heartburn, hematuria, incontinence, suspicious skin lesions, transient blindness, depression, unusual  weight change, abnormal bleeding, enlarged lymph nodes, and angioedema.     Objective:   Physical Exam      WD, Obese, 73 y/o BF in NAD... c/o pain in her left lat chest wall> no rash present. GENERAL:  Alert & oriented; pleasant & cooperative... HEENT:  [place]/AT, EOM-wnl, PERRLA, EACs-bilat cerumen, TMs-wnl, NOSE-clear, THROAT-clear & wnl. NECK:  Supple w/ fairROM; no JVD; normal carotid impulses w/o bruits; no thyromegaly or nodules palpated; no lymphadenopathy. CHEST:  Clear to P & A; without wheezes/ rales/ or rhonchi... +trigger points along trapezius area and chest wall. HEART:  Regular Rhythm; without murmurs/ rubs/ or gallops heard... +tender to palp. ABDOMEN:  Obese, soft & nontender; normal bowel sounds; no organomegaly or masses detected. EXT: without deformities, mild arthritic changes; no varicose veins/ +venous insuffic/ tr edema. NEURO:  CN's intact;  no focal neuro deficits... DERM:  no shingles rash found...  RADIOLOGY DATA:  Reviewed in the EPIC EMR & discussed w/ the patient... CXR 4/13 showed cardiomeg, calcif Ao, incr markings, no nodule seen, NAD...  LABORATORY DATA:  Reviewed in the EPIC EMR & discussed w/ the patient... LABS 4/13:  FLP-at goals on Lip10;  Chems- ok x BS=151 A1c=8.2;  CBC-ok;  TSH=1.30;  BNP=42;     Assessment & Plan:    Allergic Rhinitis>  She is on max meds for her chr rhinitis & has been eval by Barbaraann Faster w/ neg CT Sinus...  ASTHMA, Bronchiectasis, LLL nodule>  She refuses to take meds regularly & is encouraged to use the Nebulizer Bid, Mucinex Bid, drink plenty of fluids, etc... She will call for any breathing problems and will require OV for assessment...  HBP>  Control is adeq but her regimen but I bet compliance is poor; encouraged to take all meds regularly & bring all med bottles to the OV for review...  CAD>  On Imdur & Plavix in addition to above BP meds; followed by [facility] & felt to have mostly CWP from her FM & no signif angina; CATH  confirmed nonobstructive CAD elsewhere, patent LAD stent, med Rx...  CHOL>  Stable on Lipitor 95m/d, but it would be great to lose some weight...  DM>  DM control is fair on Metform & Glimep; Labs showed A1c=7.6 & advised to lose wt...  OBESITY>  Wt up to 217# and we discussed wt reduction strategies...  GI> GERD on Protonix457md;  Ulc colitis on Asacol & Canasa followed by DrStark...  GU> followed by DrWrenn and stable as well...  DJD/ FM/ LBP>  Followed by DrVoytek on Celebrex, Parafon, and Lorcet; she is s/p 2 ESI's w/ improvement.  Vit D Defic>  She switched to OTC vit d supplement taking 5000 u daily...  Anxiety> on alprazolam Tid prn...  Anemia> stable off her Fe supplement at present...   Patient's Medications  New Prescriptions   DIPHENHYD-HYDROCORT-NYSTATIN (FIRST-DUKES MOUTHWASH) SUSP    1 tsp gargle and swallow four times daily as needed  Previous Medications   ACCU-CHEK AVIVA PLUS TEST STRIP    CHECK BLOOD SUGAR ONCE A DAY OR AS DIRECTED   ACCU-CHEK FASTCLIX LANCETS MISC    CHECK BLOOD SUGAR ONCE DAILY DX 250.00   ACETAMINOPHEN (TYLENOL) 500 MG TABLET    Take 500 mg by mouth every 6 (six) hours as needed.   ALBUTEROL (PROVENTIL) (2.5 MG/3ML) 0.083% NEBULIZER SOLUTION    TAKE 3 MLS (2.5 MG TOTAL) BY NEBULIZATION 3 (THREE) TIMES DAILY AS NEEDED FOR WHEEZING.   ALPRAZOLAM (XANAX) 0.5 MG TABLET    Take 1 tablet (0.5 mg total) by mouth 3 (three) times daily as needed for anxiety.   AMLODIPINE (NORVASC) 10 MG TABLET    TAKE 1 TABLET BY MOUTH EVERY DAY   ATENOLOL (TENORMIN) 50 MG TABLET    TAKE 1 TABLET BY MOUTH EVERY DAY   ATORVASTATIN (LIPITOR) 10 MG TABLET    TAKE 1 TABLET EVERY DAY *NO FURTHER REFILLS WITHOUT APPT*   CHOLECALCIFEROL (VITAMIN D-3) 5000 UNITS TABS    Take 5,000 Units by mouth daily.     CLOPIDOGREL (PLAVIX) 75 MG TABLET    TAKE 1 TABLET BY MOUTH EVERY DAY   FLUTICASONE (FLONASE) 50 MCG/ACT NASAL SPRAY    PLACE 2 SPRAYS INTO THE NOSE AT BEDTIME.    FUROSEMIDE (LASIX) 40 MG TABLET    Take 1 tablet (40 mg total) by mouth 3 (three) times daily.   GLIMEPIRIDE (AMARYL) 1 MG TABLET    TAKE 1 TABLET (1 MG TOTAL) BY MOUTH DAILY BEFORE BREAKFAST.   HYDROCODONE-ACETAMINOPHEN (NORCO/VICODIN) 5-325 MG PER TABLET    TAKE 1/2 TO 1 TABLET THREE TIMES A DAY AS NEEDED FOR PAIN   HYDROCODONE-HOMATROPINE (HYCODAN) 5-1.5 MG/5ML SYRUP    Take 1 tsp every 6 hours as needed for cough   HYDROCORTISONE (ANUSOL-HC) 2.5 % RECTAL CREAM    Use small amount to the rectal area 3-4 times daily.   HYDROCORTISONE (ANUSOL-HC) 25 MG SUPPOSITORY    Take for a week in the morning   ISOSORBIDE MONONITRATE (IMDUR) 60 MG 24 HR TABLET    TAKE 1 TABLET BY MOUTH DAILY.   KLOR-CON M20 20 MEQ TABLET    TAKE 1 TABLET (20 MEQ TOTAL) BY MOUTH 2 (TWO) TIMES DAILY.   LIALDA 1.2 G EC TABLET    TAKE 2 TABS TWICE DAILY   LIDODERM 5 %    APPLY 1 PATCH TO SKIN FOR 12 HOURS THEN REMOVE AND DISCARD PATCH WITHIN 72 HOURS OR AS DIRECTED   LOPERAMIDE-SIMETHICONE (IMODIUM MULTI-SYMPTOM RELIEF) 2-125 MG TABS    Take 1 tablet by mouth as needed.   LOSARTAN (COZAAR) 50 MG TABLET    TAKE 1 TABLET (50 MG TOTAL) BY MOUTH DAILY.   MAGNESIUM OXIDE (MAG-OX) 400 (241.3 MG) MG TABLET    TAKE 1 TABLET (400 MG TOTAL) BY MOUTH DAILY.   MESALAMINE (ROWASA) 4 G ENEMA    Place 60 mLs (4 g total) rectally at bedtime.   MESALAMINE (ROWASA) 4 G ENEMA    Place 60 mLs (4 g total) rectally at bedtime.   METFORMIN (GLUCOPHAGE) 500 MG TABLET    TAKE 1 TABLET (500 MG TOTAL) BY MOUTH 2 (  TWO) TIMES DAILY WITH A MEAL.   MONTELUKAST (SINGULAIR) 10 MG TABLET    TAKE 1 TABLET AT BEDTIME   NABUMETONE (RELAFEN) 750 MG TABLET    TAKE 1 TABLET BY MOUTH TWICE A DAY WITH A MEAL   NAFTIFINE HCL (NAFTIN) 2 % CREA    Apply topically 2 (two) times daily.   NEOMYCIN-POLYMYXIN-HYDROCORTISONE (CORTISPORIN) OTIC SOLUTION    Use 1 drop in affected ear three times daily as needed   NITROSTAT 0.4 MG SL TABLET    1 TABLET UNDER TONGUE AT ONSET OF  CHEST PAIN YOU MAY REPEAT EVERY 5 MINUTES FOR UP TO 3 DOSES.   NYSTATIN (MYCOSTATIN) 100000 UNIT/ML SUSPENSION    GARGLE & SWALLOW 1 TEASPOONFUL 4 TIMES A DAY   PANTOPRAZOLE (PROTONIX) 40 MG TABLET    TAKE 1 TABLET BY MOUTH DAILY AT 12 NOON.   PROMETHAZINE (PHENERGAN) 25 MG TABLET    Take 25 mg by mouth 2 (two) times daily as needed. For nausea   TIZANIDINE (ZANAFLEX) 4 MG TABLET    TAKE 1 CAPSULE (4 MG TOTAL) BY MOUTH 3 (THREE) TIMES DAILY AS NEEDED FOR MUSCLE SPASMS.   VENTOLIN HFA 108 (90 BASE) MCG/ACT INHALER    TAKE 1 TO 2 PUFFS EVERY 6 HOURS AS NEEDED FOR WHEEZING  Modified Medications   Modified Medication Previous Medication   GLUCOSE BLOOD (ACCU-CHEK AVIVA) TEST STRIP glucose blood (ACCU-CHEK AVIVA) test strip      Test blood sugar daily    Test blood sugar daily   HYDROXYZINE (ATARAX/VISTARIL) 25 MG TABLET hydrOXYzine (ATARAX/VISTARIL) 25 MG tablet      TAKE 1 TABLET BY MOUTH EVERY 4 HOURS AS NEEDED FOR ITCHING    TAKE 1 TABLET BY MOUTH EVERY 4 HOURS AS NEEDED FOR ITCHING  Discontinued Medications   BLOOD GLUCOSE MONITORING SUPPL (ACCU-CHEK AVIVA PLUS) W/DEVICE KIT    1 Device by Does not apply route daily. Check blood sugar once daily---DX  250.00

## 2013-08-11 NOTE — Patient Instructions (Signed)
Today we updated your med list in our EPIC system...    Continue your current medications the same...  Today we did a pulmonary function test and based on this result> your breathing is more restricted than obstructed...    And LOSING WEIGHT would be the thing that will help you the most!!!    Count calories and eat less...  Exercise & burn more...  We also did your follow up FASTING blood work>    We will contact you w/ the results when available...   Avoid contact w/ the pollen & use a SALINE nasal mist to clear out your nasal passages    Add an OTC Antistamine like Claritin, Zyrtek, or Allegra - one tab daily in the AM...    Use the Flonase at bedtime- 2 sprays in each nostril...  For the drainage and sore throat>     Use the Magic Mouthwash as needed...  Call for any questions or if we can be of service in any way.Marland KitchenMarland Kitchen

## 2013-08-12 LAB — VITAMIN D 25 HYDROXY (VIT D DEFICIENCY, FRACTURES): VIT D 25 HYDROXY: 29 ng/mL — AB (ref 30–89)

## 2013-08-17 ENCOUNTER — Encounter: Payer: Self-pay | Admitting: Nurse Practitioner

## 2013-08-17 ENCOUNTER — Ambulatory Visit (INDEPENDENT_AMBULATORY_CARE_PROVIDER_SITE_OTHER): Payer: Medicare Other | Admitting: Nurse Practitioner

## 2013-08-17 ENCOUNTER — Other Ambulatory Visit: Payer: Self-pay | Admitting: *Deleted

## 2013-08-17 VITALS — BP 112/68 | HR 80 | Ht 61.0 in | Wt 213.0 lb

## 2013-08-17 DIAGNOSIS — I2510 Atherosclerotic heart disease of native coronary artery without angina pectoris: Secondary | ICD-10-CM

## 2013-08-17 DIAGNOSIS — K5190 Ulcerative colitis, unspecified, without complications: Secondary | ICD-10-CM | POA: Diagnosis not present

## 2013-08-17 DIAGNOSIS — Z86010 Personal history of colonic polyps: Secondary | ICD-10-CM | POA: Diagnosis not present

## 2013-08-17 MED ORDER — MESALAMINE 4 G RE ENEM: ENEMA | RECTAL | Status: DC

## 2013-08-17 MED ORDER — ATORVASTATIN CALCIUM 10 MG PO TABS: ORAL_TABLET | ORAL | Status: DC

## 2013-08-17 NOTE — Patient Instructions (Addendum)
Continue your Rowasa for two weeks and then use as needed. New prescription was sent to your pharmacy.   Call back if your symptoms reoccur    Stop all hemorrhoid treatments

## 2013-08-17 NOTE — Progress Notes (Signed)
     History of Present Illness:  73 year old female with left sided UC maintained on lialda 4.8 grams / daily. No active disease on last colonoscopy with biopsy July 2014. I saw her 08/03/13 with nausea and loose, bloody stools. C-diff was negative. She had hemorrhoids on exam. I treated her with Rowasa enemas and steroid suppositories. Patient back for recheck. Both the bleeding and diarrhea have resolved. She feels great.   Current Medications, Allergies, Past Medical History, Past Surgical History, Family History and Social History were reviewed in [employer] record.   Physical Exam: General: Pleasant, well developed , black female in no acute distress Head: Normocephalic and atraumatic Eyes:  sclerae anicteric, conjunctiva pink  Ears: Normal auditory acuity Heart: Regular rate and rhythm Abdomen: Soft, protuberant, non-tender. No masses felt, possibly mild hepatomegaly.  Extremities: No edema  Neurological: Alert oriented x 4, grossly nonfocal Psychological:  Alert and cooperative. Normal mood and affect  Assessment and Recommendations: 73 year old female with left sided UC maintained on lialda 4.8 grams / daily. Seen on 08/06/13 with nausea and loose, bloody stools. C-diff negative. Added nightly rowasa enemas and steroid suppositories (hemorrhoids). Back for recheck and all symptoms resolved. She needs to complete full 2 week course of Rowasa enemas. Can stop steroid suppositories now. Call for recurrent symptoms.

## 2013-08-21 ENCOUNTER — Encounter: Payer: Self-pay | Admitting: Cardiology

## 2013-08-21 ENCOUNTER — Ambulatory Visit (INDEPENDENT_AMBULATORY_CARE_PROVIDER_SITE_OTHER): Payer: Medicare Other | Admitting: Cardiology

## 2013-08-21 VITALS — BP 122/68 | HR 64 | Ht 61.0 in | Wt 219.4 lb

## 2013-08-21 DIAGNOSIS — F4321 Adjustment disorder with depressed mood: Secondary | ICD-10-CM | POA: Diagnosis not present

## 2013-08-21 DIAGNOSIS — R0989 Other specified symptoms and signs involving the circulatory and respiratory systems: Secondary | ICD-10-CM

## 2013-08-21 DIAGNOSIS — R9430 Abnormal result of cardiovascular function study, unspecified: Secondary | ICD-10-CM

## 2013-08-21 DIAGNOSIS — I509 Heart failure, unspecified: Secondary | ICD-10-CM

## 2013-08-21 DIAGNOSIS — E785 Hyperlipidemia, unspecified: Secondary | ICD-10-CM

## 2013-08-21 DIAGNOSIS — I10 Essential (primary) hypertension: Secondary | ICD-10-CM | POA: Diagnosis not present

## 2013-08-21 DIAGNOSIS — I2510 Atherosclerotic heart disease of native coronary artery without angina pectoris: Secondary | ICD-10-CM

## 2013-08-21 DIAGNOSIS — I5032 Chronic diastolic (congestive) heart failure: Secondary | ICD-10-CM | POA: Diagnosis not present

## 2013-08-21 MED ORDER — ISOSORBIDE MONONITRATE ER 60 MG PO TB24: 60.0000 mg | ORAL_TABLET | Freq: Every day | ORAL | Status: DC

## 2013-08-21 MED ORDER — ATORVASTATIN CALCIUM 10 MG PO TABS: ORAL_TABLET | ORAL | Status: DC

## 2013-08-21 MED ORDER — ATENOLOL 50 MG PO TABS: 50.0000 mg | ORAL_TABLET | Freq: Every day | ORAL | Status: DC

## 2013-08-21 MED ORDER — AMLODIPINE BESYLATE 10 MG PO TABS: 10.0000 mg | ORAL_TABLET | Freq: Every day | ORAL | Status: DC

## 2013-08-21 MED ORDER — FUROSEMIDE 40 MG PO TABS: 40.0000 mg | ORAL_TABLET | Freq: Three times a day (TID) | ORAL | Status: DC

## 2013-08-21 MED ORDER — LOSARTAN POTASSIUM 50 MG PO TABS: 50.0000 mg | ORAL_TABLET | Freq: Every day | ORAL | Status: DC

## 2013-08-21 MED ORDER — CLOPIDOGREL BISULFATE 75 MG PO TABS: 75.0000 mg | ORAL_TABLET | Freq: Every day | ORAL | Status: DC

## 2013-08-21 MED ORDER — NITROGLYCERIN 0.4 MG SL SUBL: 0.4000 mg | SUBLINGUAL_TABLET | SUBLINGUAL | Status: DC | PRN

## 2013-08-21 MED ORDER — POTASSIUM CHLORIDE CRYS ER 20 MEQ PO TBCR: 20.0000 meq | EXTENDED_RELEASE_TABLET | Freq: Two times a day (BID) | ORAL | Status: DC

## 2013-08-21 NOTE — Assessment & Plan Note (Signed)
We know from her catheterization in August, 2013 that her LAD stent is patent. She does have other moderate disease that is treated medically. Her ejection fraction is normal. She is on a statin.

## 2013-08-21 NOTE — Patient Instructions (Signed)
**Note De-Identified  Obfuscation** Your physician recommends that you continue on your current medications as directed. Please refer to the Current Medication list given to you today.  Your physician wants you to follow-up in: 1 year. You will receive a reminder letter in the mail two months in advance. If you don't receive a letter, please call our office to schedule the follow-up appointment.

## 2013-08-21 NOTE — Assessment & Plan Note (Signed)
Blood pressure is controlled. No change in therapy. 

## 2013-08-21 NOTE — Assessment & Plan Note (Signed)
The patient is on atorvastatin. Her LDL is 65. She is not on a full guideline directed dose of atorvastatin at this time. Because her LDL is controlled she would prefer to stay on the current dose.

## 2013-08-21 NOTE — Assessment & Plan Note (Signed)
I had a long discussion with her about the loss of her mother. We also discussed the care she is offering her daughter who is taking chemotherapy for her cancer. The patient appears to be dealing with this relatively well.  As part of today's evaluation I spent greater than 25 minutes with her total care. More than half of this time was with direct contact with the patient reviewing all of her medical problems and discussing the treatment of her CHF and the grief that she has about the loss of her mother.

## 2013-08-21 NOTE — Assessment & Plan Note (Signed)
The patient has chronic diastolic CHF. She does use her diuretic. I had a careful discussion with her about her salt intake. We also talked about cutting back her fluid intake. I told her that she could increase the dose of her furosemide if she were to feel extra edema at any time. She has good renal function. She understands the discussion.

## 2013-08-21 NOTE — Progress Notes (Signed)
Patient ID: [name], female   DOB: [birth year], 73 y.o.   MRN: [id number]    HPI  Patient is seen today for followup coronary disease and diastolic CHF. I saw her last March, 2014. She is doing well. She helps take care of her daughter who is receiving chemotherapy for breast cancer. She has some shortness of breath at times. At those times she notes increased edema. Her diuretic it is effective. She's not having any chest pain. Her last catheterization was done 2013. Medical therapy has been recommended. She's not having any chest pain.  Allergies  Allergen Reactions  . Doxycycline Other (See Comments)    Unsteady gait  . Amoxicillin   . Aspirin Nausea And Vomiting  . Azithromycin     REACTION: pt states "ZPak doesn't work"  . Ciprofloxacin Nausea And Vomiting  . Codeine Nausea And Vomiting  . Levofloxacin     REACTION: chest pain  . Oxycodone-Acetaminophen     REACTION: vomiting  . Sulfamethizole   . Tramadol Hcl     REACTION: pt states "spaced-out"    Current Outpatient Prescriptions  Medication Sig Dispense Refill  . ACCU-CHEK AVIVA PLUS test strip CHECK BLOOD SUGAR ONCE A DAY OR AS DIRECTED  100 each  1  . ACCU-CHEK FASTCLIX LANCETS MISC CHECK BLOOD SUGAR ONCE DAILY DX 250.00  102 each  1  . acetaminophen (TYLENOL) 500 MG tablet Take 500 mg by mouth every 6 (six) hours as needed.      Marland Kitchen albuterol (PROVENTIL) (2.5 MG/3ML) 0.083% nebulizer solution TAKE 3 MLS (2.5 MG TOTAL) BY NEBULIZATION 3 (THREE) TIMES DAILY AS NEEDED FOR WHEEZING.  300 mL  2  . ALPRAZolam (XANAX) 0.5 MG tablet Take 1 tablet (0.5 mg total) by mouth 3 (three) times daily as needed for anxiety.  90 tablet  2  . amLODipine (NORVASC) 10 MG tablet TAKE 1 TABLET BY MOUTH EVERY DAY  90 tablet  3  . atenolol (TENORMIN) 50 MG tablet TAKE 1 TABLET BY MOUTH EVERY DAY  90 tablet  3  . atorvastatin (LIPITOR) 10 MG tablet TAKE 1 TABLET EVERY DAY  30 tablet  0  . Cholecalciferol (VITAMIN D-3) 5000 UNITS TABS Take 5,000  Units by mouth daily.        . clopidogrel (PLAVIX) 75 MG tablet TAKE 1 TABLET BY MOUTH EVERY DAY  90 tablet  3  . Diphenhyd-Hydrocort-Nystatin (FIRST-DUKES MOUTHWASH) SUSP 1 tsp gargle and swallow four times daily as needed  120 mL  5  . fluticasone (FLONASE) 50 MCG/ACT nasal spray PLACE 2 SPRAYS INTO THE NOSE AT BEDTIME.  16 g  3  . furosemide (LASIX) 40 MG tablet Take 1 tablet (40 mg total) by mouth 3 (three) times daily.  90 tablet  0  . glimepiride (AMARYL) 1 MG tablet TAKE 1 TABLET (1 MG TOTAL) BY MOUTH DAILY BEFORE BREAKFAST.  90 tablet  1  . glucose blood (ACCU-CHEK AVIVA) test strip Test blood sugar daily  100 each  2  . HYDROcodone-acetaminophen (NORCO/VICODIN) 5-325 MG per tablet TAKE 1/2 TO 1 TABLET THREE TIMES A DAY AS NEEDED FOR PAIN  90 tablet  0  . HYDROcodone-homatropine (HYCODAN) 5-1.5 MG/5ML syrup Take 1 tsp every 6 hours as needed for cough  180 mL  0  . hydrOXYzine (ATARAX/VISTARIL) 25 MG tablet TAKE 1 TABLET BY MOUTH EVERY 4 HOURS AS NEEDED FOR ITCHING  50 tablet  2  . isosorbide mononitrate (IMDUR) 60 MG 24 hr tablet TAKE 1 TABLET  BY MOUTH DAILY.  90 tablet  0  . KLOR-CON M20 20 MEQ tablet TAKE 1 TABLET (20 MEQ TOTAL) BY MOUTH 2 (TWO) TIMES DAILY.  60 tablet  6  . LIALDA 1.2 G EC tablet TAKE 2 TABS TWICE DAILY  120 tablet  3  . LIDODERM 5 % APPLY 1 PATCH TO SKIN FOR 12 HOURS THEN REMOVE AND DISCARD PATCH WITHIN 72 HOURS OR AS DIRECTED  30 patch  2  . Loperamide-Simethicone (IMODIUM MULTI-SYMPTOM RELIEF) 2-125 MG TABS Take 1 tablet by mouth as needed.      Marland Kitchen losartan (COZAAR) 50 MG tablet TAKE 1 TABLET (50 MG TOTAL) BY MOUTH DAILY.  90 tablet  1  . magnesium oxide (MAG-OX) 400 (241.3 MG) MG tablet TAKE 1 TABLET (400 MG TOTAL) BY MOUTH DAILY.  30 tablet  6  . mesalamine (ROWASA) 4 G enema Place enema rectally at bedtime---Do for two weeks and then as needed  60 mL  2  . metFORMIN (GLUCOPHAGE) 500 MG tablet TAKE 1 TABLET (500 MG TOTAL) BY MOUTH 2 (TWO) TIMES DAILY WITH A MEAL.   180 tablet  1  . montelukast (SINGULAIR) 10 MG tablet TAKE 1 TABLET AT BEDTIME  90 tablet  1  . nabumetone (RELAFEN) 750 MG tablet TAKE 1 TABLET BY MOUTH TWICE A DAY WITH A MEAL  180 tablet  3  . Naftifine HCl (NAFTIN) 2 % CREA Apply topically 2 (two) times daily.      Marland Kitchen neomycin-polymyxin-hydrocortisone (CORTISPORIN) otic solution Use 1 drop in affected ear three times daily as needed  10 mL  0  . NITROSTAT 0.4 MG SL tablet 1 TABLET UNDER TONGUE AT ONSET OF CHEST PAIN YOU MAY REPEAT EVERY 5 MINUTES FOR UP TO 3 DOSES.  25 tablet  0  . nystatin (MYCOSTATIN) 100000 UNIT/ML suspension GARGLE & SWALLOW 1 TEASPOONFUL 4 TIMES A DAY  120 mL  3  . pantoprazole (PROTONIX) 40 MG tablet TAKE 1 TABLET BY MOUTH DAILY AT 12 NOON.  30 tablet  2  . promethazine (PHENERGAN) 25 MG tablet Take 25 mg by mouth 2 (two) times daily as needed. For nausea      . tiZANidine (ZANAFLEX) 4 MG tablet TAKE 1 CAPSULE (4 MG TOTAL) BY MOUTH 3 (THREE) TIMES DAILY AS NEEDED FOR MUSCLE SPASMS.  90 tablet  1  . VENTOLIN HFA 108 (90 BASE) MCG/ACT inhaler TAKE 1 TO 2 PUFFS EVERY 6 HOURS AS NEEDED FOR WHEEZING  18 each  1   No current facility-administered medications for this visit.    History   Social History  . Marital Status: Divorced    Spouse Name: N/A    Number of Children: N/A  . Years of Education: N/A   Occupational History  . Retired    Social History Main Topics  . Smoking status: Former Smoker -- 2.00 packs/day for 15 years    Types: Cigarettes    Quit date: 04/30/1973  . Smokeless tobacco: Never Used     Comment: ex-smoker: smoked for 10-15 years up to 2ppd, quit 1975.  Marland Kitchen Alcohol Use: No     Comment: Social drinker  . Drug Use: No  . Sexual Activity: No   Other Topics Concern  . Not on file   Social History Narrative  . No narrative on file    Family History  Problem Relation Age of Onset  . Pneumonia Father   . Heart attack Father   . Parkinsonism Mother   . Sarcoidosis  Brother   .  Hypertension Sister   . Diabetes Sister   . Colon cancer Neg Hx     Past Medical History  Diagnosis Date  . CHF (congestive heart failure)     Diastolic  . Volume overload   . Bronchiectasis   . Hypertension   . CAD (coronary artery disease)     BMS to the LAD, 2002; cath 12/07/11 patent LAD stent and mild nonobstructive disease, EF 65%; Medical management  . Diabetes mellitus   . Obesity   . GERD (gastroesophageal reflux disease)   . Fibromyalgia   . Anxiety   . Allergy, unspecified not elsewhere classified   . Pulmonary nodule     Negative PET in 2010  . Other diseases of lung, not elsewhere classified   . Hypercholesterolemia   . Diverticulosis of colon   . Ulcerative colitis, left sided 2008    Hx of  . Adenomatous colon polyp 02/2006  . Microscopic hematuria   . Neoplasm of kidney   . Stress incontinence, female   . DJD (degenerative joint disease)   . Vitamin D deficiency disease   . Anemia   . Syncope   . UTI (lower urinary tract infection)   . Arthritis   . Pinched vertebral nerve   . Ejection fraction     Past Surgical History  Procedure Laterality Date  . Cholecystectomy    . Hand surgery  01/2006    Right Hand Surgery by Dr. Amedeo Plenty  . Coronary stent placement  2002    BMS to the LAD  . Abdominal hysterectomy    . Cardiac catheterization  11/2011  . Colonoscopy w/ biopsies  10/2012    Multiple  . Esophagogastroduodenoscopy  12/2008    Patient Active Problem List   Diagnosis Date Noted  . Ejection fraction   . UC (ulcerative colitis) 08/04/2013  . Diarrhea 08/04/2013  . Back pain, chronic 02/06/2012  . Hyperlipidemia 12/07/2011  . Hematuria 11/27/2011  . Syncope and collapse 11/25/2011  . Ulcerative colitis 11/25/2011  . Chronic diastolic CHF (congestive heart failure) 11/25/2011  . Lower extremity edema 11/25/2011  . Nonischemic cardiomyopathy 04/17/2011  . CAD (coronary artery disease)   . Bronchiectasis   . Hypertension   . PERSONAL HX  COLONIC POLYPS 02/08/2010  . VITAMIN D DEFICIENCY 12/03/2009  . BRONCHITIS, ACUTE 02/28/2009  . NAUSEA AND VOMITING 12/06/2008  . DEPRESSION 08/10/2008  . PULMONARY NODULE 07/15/2008  . NEOPLASM, KIDNEY 03/16/2008  . MICROSCOPIC HEMATURIA 03/16/2008  . STRESS INCONTINENCE 03/16/2008  . COLONIC POLYPS 07/19/2007  . DIABETES MELLITUS 07/19/2007  . OBESITY 07/19/2007  . ANEMIA 07/19/2007  . ASTHMA 07/19/2007  . DIVERTICULOSIS OF COLON 07/19/2007  . ANXIETY 03/19/2007  . GERD 03/19/2007  . DEGENERATIVE JOINT DISEASE 03/19/2007  . FIBROMYALGIA 03/19/2007  . ALLERGY 03/19/2007    ROS   Patient denies fever, chills, headache, sweats, rash, change in vision, change in hearing, chest pain, cough, nausea vomiting, urinary symptoms. All other systems are reviewed and are negative.  PHYSICAL EXAM  The patient is overweight. In general she looks good. She is oriented to person time and place. Affect is normal. Head is atraumatic. Conjunctiva are normal. There is no jugular venous distention. Lungs are clear. Respiratory effort is nonlabored. Cardiac exam reveals S1 and S2. Abdomen is soft. There is 1+ peripheral edema. There are no musculoskeletal deformities. There are no skin rashes.  Filed Vitals:   08/21/13 0836  BP: 122/68  Pulse: 64  Height: 5'  1" (1.549 m)  Weight: 219 lb 6.4 oz (99.519 kg)    EKG is done today and reviewed by me. I compared to a prior tracing. There is normal sinus rhythm. There is no change from the past.  ASSESSMENT & PLAN

## 2013-08-27 ENCOUNTER — Ambulatory Visit (INDEPENDENT_AMBULATORY_CARE_PROVIDER_SITE_OTHER): Payer: Medicare Other | Admitting: Family Medicine

## 2013-08-27 ENCOUNTER — Encounter: Payer: Self-pay | Admitting: Family Medicine

## 2013-08-27 VITALS — BP 120/76 | Temp 98.6°F | Ht 61.0 in | Wt 210.0 lb

## 2013-08-27 DIAGNOSIS — M1990 Unspecified osteoarthritis, unspecified site: Secondary | ICD-10-CM

## 2013-08-27 DIAGNOSIS — E785 Hyperlipidemia, unspecified: Secondary | ICD-10-CM

## 2013-08-27 DIAGNOSIS — E119 Type 2 diabetes mellitus without complications: Secondary | ICD-10-CM

## 2013-08-27 DIAGNOSIS — I10 Essential (primary) hypertension: Secondary | ICD-10-CM

## 2013-08-27 DIAGNOSIS — I428 Other cardiomyopathies: Secondary | ICD-10-CM

## 2013-08-27 DIAGNOSIS — I2510 Atherosclerotic heart disease of native coronary artery without angina pectoris: Secondary | ICD-10-CM

## 2013-08-27 DIAGNOSIS — E669 Obesity, unspecified: Secondary | ICD-10-CM

## 2013-08-27 DIAGNOSIS — K5190 Ulcerative colitis, unspecified, without complications: Secondary | ICD-10-CM

## 2013-08-27 DIAGNOSIS — F329 Major depressive disorder, single episode, unspecified: Secondary | ICD-10-CM

## 2013-08-27 DIAGNOSIS — F3289 Other specified depressive episodes: Secondary | ICD-10-CM

## 2013-08-27 NOTE — Progress Notes (Signed)
Pre visit review using our clinic review tool, if applicable. No additional management support is needed unless otherwise documented below in the visit note.

## 2013-08-27 NOTE — Progress Notes (Signed)
   Subjective:    Patient ID: [name], female    DOB: December 29, 1940, 73 y.o.   MRN: [id number]  HPI 73 yr old female to establish with [place] after transferring from Dr. Lenna Gilford. She is doing well in general. She is under a lot of stress due to caring for her daughter who has been treated for breast cancer. She sees Dr. Ron Parker regularly for cardiac issues. Her last A1c on 08-11-13 was 7.6.    Review of Systems  Constitutional: Negative.   Respiratory: Negative.   Cardiovascular: Negative.        Objective:   Physical Exam  Constitutional: She appears well-developed and well-nourished.  Cardiovascular: Normal rate, regular rhythm, normal heart sounds and intact distal pulses.   Pulmonary/Chest: Effort normal and breath sounds normal.          Assessment & Plan:  She seems to be doing well . I reminded her about watching her diet and trying to get some exercise.

## 2013-09-05 ENCOUNTER — Other Ambulatory Visit (HOSPITAL_COMMUNITY): Payer: Self-pay | Admitting: Cardiology

## 2013-09-28 ENCOUNTER — Telehealth: Payer: Self-pay | Admitting: Pulmonary Disease

## 2013-09-28 ENCOUNTER — Telehealth: Payer: Self-pay | Admitting: Family Medicine

## 2013-09-28 MED ORDER — MAGNESIUM OXIDE 400 (241.3 MG) MG PO TABS: ORAL_TABLET | ORAL | Status: DC

## 2013-09-28 MED ORDER — HYDROXYZINE HCL 25 MG PO TABS: ORAL_TABLET | ORAL | Status: DC

## 2013-09-28 MED ORDER — CEFDINIR 300 MG PO CAPS: 300.0000 mg | ORAL_CAPSULE | Freq: Two times a day (BID) | ORAL | Status: DC

## 2013-09-28 MED ORDER — METFORMIN HCL 500 MG PO TABS: ORAL_TABLET | ORAL | Status: DC

## 2013-09-28 MED ORDER — TIZANIDINE HCL 4 MG PO TABS: ORAL_TABLET | ORAL | Status: DC

## 2013-09-28 NOTE — Telephone Encounter (Addendum)
Per SN---  omnicef 300 mg  #14  1 po bid Align once daily.   lmomtcb x 1

## 2013-09-28 NOTE — Telephone Encounter (Signed)
Pt c/o increased cough and with chest congestion x 3 days Very little mucus production--yellow/[name] Pain in color Requesting abx be called into pharmacy-- pt reports she can take Cefdinir without having side effects. Using nebulizer--feels that it is not helping 100%  [facility] [address]  Allergies  Allergen Reactions  . Doxycycline Other (See Comments)    Unsteady gait  . Amoxicillin   . Aspirin Nausea And Vomiting  . Azithromycin     REACTION: pt states "ZPak doesn't work"  . Ciprofloxacin Nausea And Vomiting  . Codeine Nausea And Vomiting  . Levofloxacin     REACTION: chest pain  . Oxycodone-Acetaminophen     REACTION: vomiting  . Sulfamethizole   . Tramadol Hcl     REACTION: pt states "spaced-out"   Please advise Dr Lenna Gilford. Thanks.

## 2013-09-28 NOTE — Telephone Encounter (Signed)
Pt called back and she is aware of abx that has been sent to the pharmacy.  Pt is aware and nothing further is needed.

## 2013-09-28 NOTE — Telephone Encounter (Signed)
Pt requesting refills of the following:  (pharmacy -[facility] [address])  magnesium oxide (MAG-OX) 400 (241.3 MG) MG tablet  (pt out of med)  tiZANidine (ZANAFLEX) 4 MG tablet  losartan (COZAAR) 50 MG tablet  clopidogrel (PLAVIX) 75 MG tablet  metFORMIN (GLUCOPHAGE) 500 MG tablet  hydrOXYzine (ATARAX/VISTARIL) 25 MG tablet   Please send to

## 2013-09-28 NOTE — Telephone Encounter (Signed)
I sent scripts in for Mag Ox, Zanaflex, Metformin & Atarax. Pt should still have plenty of refills for other medications according to chart.

## 2013-10-02 ENCOUNTER — Telehealth: Payer: Self-pay | Admitting: Pulmonary Disease

## 2013-10-02 MED ORDER — HYDROCODONE-HOMATROPINE 5-1.5 MG/5ML PO SYRP: 5.0000 mL | ORAL_SOLUTION | Freq: Four times a day (QID) | ORAL | Status: DC | PRN

## 2013-10-02 NOTE — Telephone Encounter (Signed)
Called and lmom to make the pt aware of SN recs.  i advised her that the rx is up front for the hycodan and that this is ready to be picked up.  Pt to call back next week for appt with TP if she is not any better.  Pt also advised to call back for any questions or concerns.

## 2013-10-02 NOTE — Telephone Encounter (Signed)
Per SN---  Hycodan #6 oz  1 tsp every 6 hours as needed for cough OV with TP next week if not better.  thanks

## 2013-10-02 NOTE — Telephone Encounter (Signed)
Pt c/o increased cough//congestion//chest pain from cough Denies fever No better -- 2 days left on abx Using Mucinex OTC, nebulizer  Requests Rx cough syrup Patient would like to know if she needs an OV -- thinks she needs cxr.   [facility]  Allergies  Allergen Reactions  . Doxycycline Other (See Comments)    Unsteady gait  . Amoxicillin   . Aspirin Nausea And Vomiting  . Azithromycin     REACTION: pt states "ZPak doesn't work"  . Ciprofloxacin Nausea And Vomiting  . Codeine Nausea And Vomiting  . Levofloxacin     REACTION: chest pain  . Oxycodone-Acetaminophen     REACTION: vomiting  . Sulfamethizole   . Tramadol Hcl     REACTION: pt states "spaced-out"   Please advise Dr Lenna Gilford. Thanks.

## 2013-10-02 NOTE — Telephone Encounter (Addendum)
LMOM x 1 Rx printed and given to SN to sign

## 2013-10-04 DIAGNOSIS — J45909 Unspecified asthma, uncomplicated: Secondary | ICD-10-CM | POA: Diagnosis not present

## 2013-10-04 DIAGNOSIS — J180 Bronchopneumonia, unspecified organism: Secondary | ICD-10-CM | POA: Diagnosis not present

## 2013-10-06 ENCOUNTER — Telehealth: Payer: Self-pay | Admitting: Family Medicine

## 2013-10-06 DIAGNOSIS — J45909 Unspecified asthma, uncomplicated: Secondary | ICD-10-CM

## 2013-10-06 NOTE — Telephone Encounter (Signed)
done

## 2013-10-06 NOTE — Telephone Encounter (Signed)
Pt is requesting a referral to [facility].

## 2013-10-07 ENCOUNTER — Encounter: Payer: Self-pay | Admitting: Adult Health

## 2013-10-07 ENCOUNTER — Telehealth: Payer: Self-pay | Admitting: Pulmonary Disease

## 2013-10-07 ENCOUNTER — Ambulatory Visit (INDEPENDENT_AMBULATORY_CARE_PROVIDER_SITE_OTHER)
Admission: RE | Admit: 2013-10-07 | Discharge: 2013-10-07 | Disposition: A | Discharge status: Home / Self Care | Payer: Medicare Other | Source: Ambulatory Visit | Attending: Adult Health | Admitting: Adult Health

## 2013-10-07 ENCOUNTER — Ambulatory Visit (INDEPENDENT_AMBULATORY_CARE_PROVIDER_SITE_OTHER): Payer: Medicare Other | Admitting: Adult Health

## 2013-10-07 VITALS — BP 100/70 | HR 78 | Temp 98.1°F | Ht 61.0 in | Wt 217.6 lb

## 2013-10-07 DIAGNOSIS — I2510 Atherosclerotic heart disease of native coronary artery without angina pectoris: Secondary | ICD-10-CM

## 2013-10-07 DIAGNOSIS — R079 Chest pain, unspecified: Secondary | ICD-10-CM | POA: Diagnosis not present

## 2013-10-07 DIAGNOSIS — R05 Cough: Secondary | ICD-10-CM | POA: Diagnosis not present

## 2013-10-07 DIAGNOSIS — R059 Cough, unspecified: Secondary | ICD-10-CM | POA: Diagnosis not present

## 2013-10-07 DIAGNOSIS — J189 Pneumonia, unspecified organism: Secondary | ICD-10-CM

## 2013-10-07 NOTE — Telephone Encounter (Signed)
Called and spoke with pt and she is aware of appt with TP today at 10:45.  Pt stated that she was seen at [facility] on Sunday and told she has PNA.  Pt is wanting to be seen today.

## 2013-10-07 NOTE — Progress Notes (Signed)
Subjective:    Patient ID: [name], female    DOB: [birth year], 73 y.o.   MRN: [id number]  HPI 73 yo female with   10/07/2013 Acute OV  Complains of wheezing, rattling, increased SOB, prod cough with now clear mucus x1 week.   Went to UC on 6/7 and given Suprax 41m x10d, Prednisone 261mx5d, Tessalon 20063m reports cxr showed L PNA. Still feeling weak. No fever , chest pain , hemoptysis, orthopnea, edema .  Appetite is okay , no n/v/d .  CXR today shows No acute findings             Problem List:               ALLERGY (ICD-995.3) - on Saline, FLONASE 2spBid, etc; she had a neg ENT eval by DrRBarbaraann Faster/13- chr rhinitis, she had a neg CT Sinus...  ASTHMA (ICD-493.90) - prev on Advair but she stopped on her own> she takes MUCINEX, NEBULIZER w/ Albut as needed & PROAIR Prn when out & about... she is an ex-smoker having quit in 1975, no recent exacerbations.  BRONCHIECTASIS (ICD-494.0) - CT Chest 3/10 showed bilat LL bronchiectasis L>R; advised to take meds regularly & avoid infections> NEBS, MUCINEX, Fluids, etc... ~  PFTs 4/15 showed FVC=1.34 (64%), FEV1=1.13 (69%), %1sec=84, and mid-flows= 97% predicted... c/w mod restriction and she is instructed to decrease the weight & incr exercise...  PULMONARY NODULE (ICD-518.89) - 1.4cm nodule in LLL showed up on Urology CTAbd 07/12/08>  CT Chest 07/22/08 confirmed 1.4cm peripheral LLL nodule that wasn't present on CT in 2005 & no other lesions, +bronchiectasis in both LLs L>R, mod cardiomegaly...  PET Scan 4/10 showed no abn hypermetabolic activity- therefore likely scar or granuloma... ~  CXR 2/11 showed cardiomeg, some COPD, NAD- LLL nodule seen on CT 3/10 is not vis on CXR. ~  CXR 1/12 in ER showed cardiomeg, vasc congestion, no overt edema, no nodule seen. ~  CXR 4/13 showed cardiomeg, calcif Ao, incr markings, no nodule seen, NAD... Marland Kitchen  CXR 8/13 [facility] [place] borderline heart size, sl incr interstitial markings (mild edema), calcif  in Ao, NAD... Marland Kitchen  CXR 10/14 showed cardiomeg, mild peribronchial thickening, NAD, some thoracic spondylosis noted.  HYPERTENSION (ICD-401.9) - controlled on ATENOLOL 63m2m NORVASC 10mg57mLOSARTAN 100mg/27mASIX 40mg- 7mbs/d (2AM, 1PM), KCL 20Bid...  ~  9/12:  BP=138/80 today, not checking BP at home... denies HAs, visual changes, palipit, dizziness, syncope, dyspnea, edema, etc... ~  4/13:  BP= 126/74 & she is relatively asymptomatic but has mult somatic complaints... ~  4/14:  on Aten50, Amlodip10, Losar50, Lasix40-3/d, K20Bid, Imdur60, Plavix75; she has some CWP but no angina> BP today= 118/78 & she notes some atypCP, rare palpit, occas dizzy w/o syncope, +SOB/ DOE, & intermittent edema. ~  Note> 4/14 labs showed K=3.4, Mg=1.5, & she is requested to take the K20Bid & add Magox 400/d... ~  10/14: on Aten50, Amlodip10, Losar50, Lasix40-3/d, K20Bid, Imdur60, Plavix75; she has some CWP but no angina> BP today= 118/62 & she remains stable... ~  4/15: She is followed by DrKatz Benay Spicerds- HBP, CAD, Hx CP; she notes "I deal w/ it" and BP= 132/84 on her Aten50, Amlod10, Losar50, Imdur60, Lasix (40Tid) and K20Bid  CORONARY ARTERY DISEASE (ICD-414.00) - on PLAVIX 75mg/d 20mOL/ allergic to ASA) & IMDUR 60mg dai92m. ~  she is s/p PTCA & stent to the LAD on 10/02... ~  last cath 2/07 w/ patent LAD stent, non-obstructive dis otherwise, EF=65%... ~  last NuclearStressTest 5/08 showed no ischemia, EF=64% (similiar to 2005)... ~  hosp in Jun09 w/ CP- cath showing non-obstructive CAD w/ 25-40% lesions in all 3 vessels, prev stent in LAD patent, norm LVF.. ~  Baseline EKG shows NSR, minor NSSTTWA, NAD... ~  Last Cards f/u 12/12> Cad, stent in LAD, c/o freq atypCPs, IMDUR increased to 63m/d- encouraged to incr exercise. ~  f/u MYOVIEW 12/12 showed prob normal stress nuclear study w/ mild reversible defect in anterior wall likely due to breast attenuation- cannot completely exclude ischemia. ~  3/14:  She saw  DrKatz for f/u CAD, DiastolicCHF, Hx syncope- she denied CP, no further dizziness; doing well on medical management, no changes made... ~  EKG 3/14 by DBenay Spiceshowed NSR, rate73, ?RAE, borderline tracing...  CHEST PAIN, ATYPICAL (ICD-786.59) - she has chronic CWP, prob related to her fibromyalgia... ~  1/12:  she went to ER, then [facility] w/ left CWP- cardiac evals neg, treated w/ rest, heat, Vicodin, Tussionex, etc... [facility]  HYPERCHOLESTEROLEMIA (ICD-272.0) - on LIPITOR 141md, + diet Rx.. ~  FLElim1/08 showed TChol 132, TG 58, HDL 60, LDL 60 ~  FLP 7/09 showed TChol 116, TG 49, HDL 49, LDL 57 ~  FLP in hosp 3/10 showed TChol 130, TG 55, HDL 53, LDL 66 ~  FLP 2/11 on Cres5 showed TChol 104, TG 67, HDL 50, LDL 40 ~  Crestor 27m56mhanged to Lipitor 94m70mr insurance co. ~  FLP 3/12 on Lip10 showed TChol 139, TG 64, HDL 57, LDL 69 ~  FLP 9/12 on Lip10 showed TChol 126, TG 79, HDL 54, LDL 57 ~  FLP 4/13 on Lip10 showed TChol 131, TG 95, HDL 52, LDL 60 ~  FLP 8/13 on Lip10 showed TChol 121, TG 68, HDL 49, LDL 58 ~  FLP 4/14 on Lip10 showed TChol 126, TG 92, HDL 44, LDL 64 ~  FLP 4/15 on Lip10 showed TChol 136, TG 72, HDL 57, LDL 65... Continue same  DIABETES MELLITUS (ICD-250.00) - on METFORMIN 500mg40m + low carb diet... ~  last labs 11/08 showed BS=120, HgA1c=6.2 ~  labs 6/09 in hosp showed BS=110-120, HgA1c= 6.6... saMarland KitchenMarland Kitchen meds, needs better diet! ~  labs 3/10 in hosp showed BS= 113-155, HgA1c= 6.7 ~  labs 2/11 showed BS= 108, A1c= 7.1 ~  labs 1/12 in ER showed BS= 167... ~  labs 3/12 showed BS= 121, A1c= 7.2... coMarland KitchenMarland Kitcheninue same meds, better diet! ~  Labs 9/12 showed BS= 131, A1c= 6.9... CoMarland KitchenMarland Kitcheninue Metform500Bid ~  Labs 4/13 showed BS= 151, A1c= 8.2... NeMarland KitchenMarland Kitchens more meds> add GLIMEPIRIDE 1mgQa60m  9/13:  She had neg Optometry f/u w/ DrMcFarland- no retinopathy, early cats noted... ~  10/13:  She is not currently on Metformin (prev 500mgBi51m the Amaryl1mgQam)39mhe  doesn't know why & I presume stopped w/ contrast studies in hosp & never placed back on her med list at disch therefore lost; Asked to restart Metformin 500mgBid 30mimep1mg/d & R59mrevcheck 3-67mo... ~  627mo: DM contol is adeq on Metform500Bid + Glimep1mgQam; lab70m/14 showed BS=147, A1c=7.2... ~  Labs 1Marland Kitchen/14 on Metform500Bid & Glim1mg showed  37m131, A1c=7.5; same meds, needs better diet... ~  Labs 4/15 on Metform500Bid + Glim1mg showed BS48m3, A1c=7.6 and she needs sl incr meds but prefers diet & exercise- get the weight down.  OBESITY (ICD-278.00)   ~  weight 11/09 = 204# ~  weight 3/10 = 208# ~  weight 2/11 = 205#... we reviewed  diet + exercise program. ~  weight 1/12 = 208# ~  Weight 3/12 = 211#... We reviewed diet & exercise yet again. ~  Weight 9/12 = 216# ~  Weight 4/13 = 217#... We reviewed diet, exercise, wt reduction program. ~  Weight 10/13 = 213# ~  Weight 4/14 = 216# ~  Weight 10/14 = 217# ~  Weight 4/15 = 217#  GERD (ICD-530.81) - prev on Prilosec 21m- taking 1-2 daily but insurance won't pay- wanted Omeprazole 449md- ok; but Cards changed to PRCenter Junction055m due to Plavix use...  DIVERTICULOSIS OF COLON (ICD-562.10) & COLONIC POLYPS (ICD-211.3) - last colonoscopy was 7/08 w/ divertics & colitis seen, + hyperplastic polyp removed... ~  1/13:  DrStark noted that she is due for f/u surveillance colon but she wanted to put it off; encouraged to call his office for this important procedure... ~  7/14: she finally had her f/u colon by DrStark> mild divertics in sigmoid, sm int hems, otherw neg; rec to continue hi fiber, Lialda2.4gm/d, & f/u 48yr348yr  Hx of ULCERATIVE COLITIS (ICD-556.9) - followed by DrStark for GI- she has been on on Lialda, Rowasa, & Hydrocort enemas in the past... ~  seen 10/11 by DrStark w/ freq loose stools- Asacol incr to 4tabs Tid & CANASA suppos for 36mo.22moHer maintenance regimen is ASACOL 400mg-83mbs Tid (but med list from DrStark shows 4tabsTid) and  CANASA suppos as needed... ~  10/13:  She had a GI f/u w/ DrStark> left sided UC w/ intermitt diarrhea, hems, hx adenomatous polyp; controlled on Asacol 4/d ~  7/14: she had GI f/u w/ AE for DrStark> hx left colon UC & prev ademoatous polyps; under control clinically & f/u colon 7/14 as above- no active colitis; rec to continue the Asacol2.4gm/d...  MICROSCOPIC HEMATURIA (ICD-599.72),  NEOPLASM, KIDNEY (ICD-239.5),  STRESS INCONTINENCE (ICD-788.39) - eval and Rx by DrWrenn 9/09 w/ stress incontinence, dysuria w/ microscopic hematuria-  Cysto w/ bladder bx was neg; and CT + Sonar w/ 1.8cm left upper pole renal lesion of ?etiology- poss tumor vs cyst (min enhancement, solid on sonar w/ min blood flow + 2 sm cysts also seen)...  they are following a watchful waiting protocol & repeat CT 3/10 showed stable 1.7cm category II cyst upper pole of left kidney without change... f/u planned 44yr. ~60yrnal Sonar 7/13 showed 2cm nodule (cyst) in upper pole of left kidney w/o change serially... ~  8/13:  She had f/u visit w/ DrWrenn> hx hemorrhagic cystitis & treated w/ Septra, she was supposed to f/u w/ them...`  DEGENERATIVE JOINT DISEASE (ICD-715.90) >> she is followed by DrVoytek et al prev on Celebrex, Parafon, Lorcet... FIBROMYALGIA (ICD-729.1) >> now on VICODIN up to Tid & RELAFEN 750mg Bi31mn... LOW BACK PAIN >> prev shots from DrVoytek et al... ~  she was seen 12/11 w/ bilat foot pain & XRay showed arthritic changes- given DepoMedrol+Toradol shot IM. ~  11/12: seen by Ortho for back & leg pain> MRI showed lumbar spondylosis & mod central canal stenosis; given ESI 11/12 w/ 75% improvement in pain. ~  She reports that ESI was repeated 3/13 & further improved by her hx... ~  She identifies DrVoytek as her chronic pain doctor=> seen 10/13 & her note is reviewed...  VITAMIN D DEFICIENCY (ICD-268.9) - Vit D level 2/11= 11 and pt rec to take Vit D 50000 u weekly but ?never filled this Rx... therefore rec 8/11 to  switch to OTC Vit D 5000 u  daily from the health food store...  ~  Labs 3/12 showed Vit D level = 46 ~  Labs 4/14 showed Vit D levewl = 39 & she is rec to take the 5000u OTC Vit D supplement every day... ~  Labs 4/15 showed Vit D = 29 & remainded to take the vit D supplenment ~5000u daily...  ANXIETY (ICD-300.00) - she has lots of stress caring for Momma... on ALPRAZOLAM 0.30m Tid Prn...  Hx of ANEMIA (ICD-285.9) - on NUIRON 1530md...  ~  labs 11/08 showed Hg=12.2, Fe=38/ TIBC=287/ sat=9%... ~  labs 3/10 in hosp showed Hg= 13.0, MCV= 92... ~  labs 2/11 showed Hg= 12.7, Fe= 67... ~  labs 1/12 in ER showed Hg= 11.7 ~  Labs 3/12 showed Hg= 12.3, MCV= 93 ~  Labs 9/12 showed Hg= 12.5, MCV= 94 ~  Labs 4/13 showed Hg= 12.7 ~  Labs 8/13 showed Hg= 11.5 ~  Labs 4/14 showed Hg= 12.3, MCV=92, Fe=59 (15%sat) & rec to continue daily iron supplement... ~  Labs 4/15 showed Hg= 12.5, MCV=93, Fe=46 (12%sat) & rec to restart FeSO4 daily...   Review of Systems Constitutional:   No  weight loss, night sweats,  Fevers, chills,  +fatigue, or  lassitude.  HEENT:   No headaches,  Difficulty swallowing,  Tooth/dental problems, or  Sore throat,                No sneezing, itching, ear ache, + nasal congestion, post nasal drip,   CV:  No chest pain,  Orthopnea, PND, swelling in lower extremities, anasarca, dizziness, palpitations, syncope.   GI  No heartburn, indigestion, abdominal pain, nausea, vomiting, diarrhea, change in bowel habits, loss of appetite, bloody stools.   Resp:  No chest wall deformity  Skin: no rash or lesions.  GU: no dysuria, change in color of urine, no urgency or frequency.  No flank pain, no hematuria   MS:  No joint pain or swelling.  No decreased range of motion.  No back pain.  Psych:  No change in mood or affect. No depression or anxiety.  No memory loss.         Objective:   Physical Exam GEN: A/Ox3; pleasant , NAD, elderly and obese   HEENT:  [place]/AT,  EACs-clear,  TMs-wnl, NOSE-clear, THROAT-clear, no lesions, no postnasal drip or exudate noted.   NECK:  Supple w/ fair ROM; no JVD; normal carotid impulses w/o bruits; no thyromegaly or nodules palpated; no lymphadenopathy.  RESP  CTA ,  w/o, wheezes/ rales/ or rhonchi.no accessory muscle use, no dullness to percussion  CARD:  RRR, no m/r/g  ,tr  peripheral edema, pulses intact, no cyanosis or clubbing.  GI:   Soft & nt; nml bowel sounds; no organomegaly or masses detected.  Musco: Warm bil, no deformities or joint swelling noted.   Neuro: alert, no focal deficits noted.    Skin: Warm, no lesions or rashes   CXR 10/07/13 No acute findings       Assessment & Plan:

## 2013-10-07 NOTE — Patient Instructions (Signed)
Finish Suprax and Prednisone as discussed.  Mucinex DM Twice daily  As needed  Cough/congestion  Fluids and rest  Please contact office for sooner follow up if symptoms do not improve or worsen or seek emergency care

## 2013-10-07 NOTE — Telephone Encounter (Signed)
I spoke with pt.

## 2013-10-08 NOTE — Assessment & Plan Note (Signed)
Reported PNA from recent UC visit -resolving on abx CXR today with no acute process noted.   Plan  Finish Suprax and Prednisone as discussed.  Mucinex DM Twice daily  As needed  Cough/congestion  Fluids and rest  Please contact office for sooner follow up if symptoms do not improve or worsen or seek emergency care

## 2013-10-09 ENCOUNTER — Telehealth: Payer: Self-pay | Admitting: Family Medicine

## 2013-10-09 NOTE — Telephone Encounter (Signed)
This referral was done.

## 2013-10-09 NOTE — Telephone Encounter (Addendum)
Pt needs referral to  See dr Donneta Romberg for asthma and allergy. Pt has medicare. Pt is requesting 2nd opinion. Pt does have pulmonologist

## 2013-10-13 ENCOUNTER — Other Ambulatory Visit (INDEPENDENT_AMBULATORY_CARE_PROVIDER_SITE_OTHER): Payer: Medicare Other

## 2013-10-13 ENCOUNTER — Ambulatory Visit (INDEPENDENT_AMBULATORY_CARE_PROVIDER_SITE_OTHER): Payer: Medicare Other | Admitting: Pulmonary Disease

## 2013-10-13 ENCOUNTER — Encounter: Payer: Self-pay | Admitting: Pulmonary Disease

## 2013-10-13 ENCOUNTER — Telehealth: Payer: Self-pay | Admitting: Pulmonary Disease

## 2013-10-13 ENCOUNTER — Other Ambulatory Visit: Payer: Self-pay | Admitting: Pulmonary Disease

## 2013-10-13 VITALS — BP 124/80 | HR 74 | Temp 97.8°F | Ht 61.0 in | Wt 218.0 lb

## 2013-10-13 DIAGNOSIS — E119 Type 2 diabetes mellitus without complications: Secondary | ICD-10-CM | POA: Diagnosis not present

## 2013-10-13 DIAGNOSIS — J189 Pneumonia, unspecified organism: Secondary | ICD-10-CM

## 2013-10-13 DIAGNOSIS — I2510 Atherosclerotic heart disease of native coronary artery without angina pectoris: Secondary | ICD-10-CM

## 2013-10-13 DIAGNOSIS — J45909 Unspecified asthma, uncomplicated: Secondary | ICD-10-CM

## 2013-10-13 DIAGNOSIS — F411 Generalized anxiety disorder: Secondary | ICD-10-CM

## 2013-10-13 DIAGNOSIS — R05 Cough: Secondary | ICD-10-CM | POA: Diagnosis not present

## 2013-10-13 DIAGNOSIS — R059 Cough, unspecified: Secondary | ICD-10-CM | POA: Diagnosis not present

## 2013-10-13 DIAGNOSIS — E669 Obesity, unspecified: Secondary | ICD-10-CM

## 2013-10-13 DIAGNOSIS — K219 Gastro-esophageal reflux disease without esophagitis: Secondary | ICD-10-CM

## 2013-10-13 LAB — BASIC METABOLIC PANEL
BUN: 11 mg/dL (ref 6–23)
CALCIUM: 9 mg/dL (ref 8.4–10.5)
CHLORIDE: 99 meq/L (ref 96–112)
CO2: 30 meq/L (ref 19–32)
Creatinine, Ser: 0.5 mg/dL (ref 0.4–1.2)
GFR: 146.15 mL/min (ref >60.00)
Glucose, Bld: 176 mg/dL — ABNORMAL HIGH (ref 70–99)
Potassium: 3.5 mEq/L (ref 3.5–5.1)
Sodium: 136 mEq/L (ref 135–145)

## 2013-10-13 LAB — CBC WITH DIFFERENTIAL/PLATELET
BASOS PCT: 0.2 % (ref 0.0–3.0)
Basophils Absolute: 0 10*3/uL (ref 0.0–0.1)
EOS ABS: 0.4 10*3/uL (ref 0.0–0.7)
Eosinophils Relative: 3.1 % (ref 0.0–5.0)
HCT: 37 % (ref 36.0–46.0)
Hemoglobin: 12.1 g/dL (ref 12.0–15.0)
Lymphocytes Relative: 12.9 % (ref 12.0–46.0)
Lymphs Abs: 1.8 10*3/uL (ref 0.7–4.0)
MCHC: 32.8 g/dL (ref 30.0–36.0)
MCV: 91.4 fl (ref 78.0–100.0)
Monocytes Absolute: 0.7 10*3/uL (ref 0.1–1.0)
Monocytes Relative: 5.2 % (ref 3.0–12.0)
NEUTROS PCT: 78.6 % — AB (ref 43.0–77.0)
Neutro Abs: 10.9 10*3/uL — ABNORMAL HIGH (ref 1.4–7.7)
Platelets: 483 10*3/uL — ABNORMAL HIGH (ref 150.0–400.0)
RBC: 4.05 Mil/uL (ref 3.87–5.11)
RDW: 14.1 % (ref 11.5–15.5)
WBC: 13.9 10*3/uL — ABNORMAL HIGH (ref 4.0–10.5)

## 2013-10-13 LAB — SEDIMENTATION RATE: Sed Rate: 26 mm/hr — ABNORMAL HIGH (ref 0–22)

## 2013-10-13 MED ORDER — PREDNISONE 20 MG PO TABS: ORAL_TABLET | ORAL | Status: DC

## 2013-10-13 MED ORDER — HYDROCOD POLST-CHLORPHEN POLST 10-8 MG/5ML PO LQCR: 5.0000 mL | Freq: Two times a day (BID) | ORAL | Status: DC | PRN

## 2013-10-13 MED ORDER — METHYLPREDNISOLONE ACETATE 80 MG/ML IJ SUSP
80.0000 mg | Freq: Once | INTRAMUSCULAR | Status: AC
  Administered 2013-10-13: 80 mg via INTRAMUSCULAR

## 2013-10-13 MED ORDER — BENZONATATE 100 MG PO CAPS: 100.0000 mg | ORAL_CAPSULE | Freq: Four times a day (QID) | ORAL | Status: DC | PRN

## 2013-10-13 NOTE — Progress Notes (Signed)
Subjective:    Patient ID: [name], female    DOB: [birth year], 73 y.o.   MRN: [id number]  HPI 73 y/o BF here for a follow up visit... she has multiple medical problems as noted below...    ~  August 03, 2011:  2moROV & [name] c/o sinusitis "blowing pieces of meat w/ blood in it", swollen glands, drainage, pain into ears, etc> we discussed Rx w/ Augmentin,Mucinex, MMW, Flonase, Saline, etc... Breathing is stable other than sinus symptoms & denies much cough, phlegm, ch in dyspnea;  Cardiac followed by [name]& Imdur incr to 657md & f/u [name] was felt to be a neg study;  Chol stable on Lip10 but DM control worse on Metform alone & we are going to add Glimep1m77m;  She's had mod problem w/ LBP & saw [name] w/ epidural steroid injections that have helped her pain...  CXR 4/13 showed cardiomeg, calcif Ao, incr markings, no nodule seen, NAD...  LABS 4/13:  FLP-at goals on Lip10;  Chems- ok x BS=151 A1c=8.2;  CBC-ok;  TSH=1.30;  BNP=42  ~  February 06, 2012:  72mo38mo & she has been hosp twice in the interval> 1) 7/28- 11/28/11 while visiting relative in ER she has a syncopal episode, believed to be vasovagal; w/u was neg- EKG, enz, monitor, 2DEcho showed DD, CDopplers, not orthostatic, K=3.2 repleted, BS=130-170 & A1c=7.1, Urine +pan-sens EColi and treated w/ Augmentin (she's allergic to cipro)...  Marland KitchenMarland Kitchen Adm 8/9 - 12/08/11 by Cards w/ CP for cath> showed patent LAD stent, mild CAD elsewhere, EF=65%; meds adjusted- see below, & Omep changed to PROTONIX40mg51mue to Plavix...    Chol looked good on Lip10 during her hosp stay...    She is not currently on Metformin (prev 500mgB25m/ the Amaryl1mgQam46msh3 doesn't know why & I presume stopped w/ contrast studies in hosp 7 never placed back on her med list at disch therefore lost; Labs today showed ==> she didn't go to lab as requested!    Her CC remains diffuse aches & pains related to her FM "it runs in the family"; she has Vicodin- allowed up to 3/d, and  Relafen 750Bid per [name] recent FM exac ppt by sitting on hard benches at a [employer] for heat, & we discussed trial LIDODERM patches...     She has a severe generalized anxiety disorder> on Xanax as needed up to 3/d...    We reviewed prob list, meds, xrays and labs> see below for updates >> she refuses the Flu shot; didn't bring med bottle or med list to office today...  ~  August 06, 2012:  74mo ROV172mooann isKaterynstated by the death of her mother last month; in addition she notes the pollen is bothering her early this spring...  We reviewed the following medical problems during today's office visit >>     Allergic Rhinitis> on OTC Antihist, Singulair, Flonase & nasal saline mist; c/o the early spring pollen, we reviewed Rx...    Asthma, Bronchiectasis, & LLL nodule on CT scan> she only takes her breathing meds as needed including Albuterol for NEB vs Proventil MDI, & she stopped Advair on her own; prob granuloma LLL (no change- see below)...    HBP & CAD> on Aten50, Amlodip10, Losar50, Lasix40-3/d, K20Bid, Imdur60, Plavix75; she has some CWP but no angina> BP today= 118/78 & she notes some atypCP, rare palpit, occas dizzy w/o syncope, +SOB/ DOE, & intermittent edema...Marland KitchenMarland Kitchen  Chol> FLP looks good on Lipitor 65m/d w/ TChol 126, TG 92, HDL 44, LDL 64; encouraged to lose some weight!    DM & Obesity> DM contol is adeq on Metform500Bid + Glimep184mam; labs 4/14 showed BS=147, A1c=7.2 & we decided same meds get wt down...    GI> GERD, Divertics, Hx UC, Polyps> she saw [name] 10/13 & she was stable on her Omep4051m for reflux, Asacol 4.8g daily & Canasa suppos prn for her UC, & f/u colon overdue & she will call to set up...    GU> followed by [name] as noted below w/ stress incont, hx UTIs, hx hematuria, & prob cyst left kidney which he continues to follow...    DJD, FM> managed by DrVoytek on Relafen, Lorcet, & Zanaflex + various other pain meds over the year...     Anxiety & Depression> Mother passed away 3/103/20/2024he takes Xanax 0.5mg33mn... We reviewed prob list, meds, xrays and labs> see below for updates >> she refuses the seasonal Flu vaccines;  Time spent 35 min...  LABS 4/14:  FLP- at goals on Lip10;  Chems- ok x BS=147, A1c=7.2, K=3.4, Mg=1.5;  CBC- wnl w/ Hg=12.3, MCV=92, Fe=59 (15%sat);  TSH=1.42;  VitD=39;  BNP=36...  ~  February 10, 2013:  59mo 69mo& [name] prob w/ her "allergies" despite Singulair, OTC antihist & Flonase; she has mult medical problems and an extensive medication list- of course she did not bring her bottles or list to the OV today...    Allergic Rhinitis> on OTC Antihist, Singulair, Flonase, Mucinex & nasal saline mist; we reviewed treatment...    Asthma, Bronchiectasis, & LLL nodule on CT scan> she only takes her breathing meds as needed including Albuterol for NEB vs Proventil MDI, & she stopped Advair on her own; prob granuloma LLL (no change- see below)...    HBP & CAD> on Aten50, Amlodip10, Losar50, Lasix40-3/d, K20Bid, Imdur60, Plavix75; she has some CWP but no angina> BP today= 118/62 & she notes some atypCP, rare palpit, occas dizzy w/o syncope, +SOB/ DOE, & intermittent edema...    Chol> on Lipitor 10mg/43m FLP 4/14 showing TChol 126, TG 92, HDL 44, LDL 64; encouraged to lose some weight!    DM & Obesity> DM contol is adeq on Metform500Bid + Glimep1mgQam31mabs 10/14 showed BS=131, A1c=7.5 & we decided same meds & get wt down...    GI> GERD, Divertics, Hx UC, Polyps> she saw [name] 7/14 & she was stable on her PPI (Protonix40) for reflux, Asacol 2 daily & Canasa suppos prn for her UC, & f/u colon done 7/14 w/ sigm divertics, no polyps, no active colitis; continue meds, recheck 38yrs.  64yr> followed by [name] as noted below w/ stress incont, hx UTIs, hx hematuria, & prob cyst left kidney which he continues to follow...    DJD, FM> managed by DrVoytek on Relafen, Vicodin, & Zanaflex + various other pain meds over the years;  she says DrV does "shots" as needed...    Anxiety & Depression> Mother passed away 3/14; sh03/20/24kes Xanax 0.5mg prn.52mWe reviewed prob list, meds, xrays and labs> see below for updates >> she refuses the 2014 Flu vaccine...  CXR 10/14 showed cardiomeg, mild peribronchial thickening, NAD, some thoracic spondylosis noted...  LABS 10/14:  Chems- ok x BS=131, A1c=7.5    ~  August 11, 2013:  59mo ROV &61[place] ho[place] from all the pollen"- c/o nasal congestion, drainage (esp at night), itchy/ sneezy, & clear  mucous w/o f/c/s;  She is taking Singulair10, OTC antihist, & Flonase;  She also notes chest congestion & wheezing she says but her PFT today is restricted and NOT obstructed;  She has NEBS w/ albut that she uses prn + Hycodan for cough...     She is followed by Benay [name] for Cards- HBP, CAD, Hx CP; she notes "I deal w/ it" and BP= 132/84 on her Aten50, Amlod10, Losar50, Imdur60, Lasix (40Tid) and K20Bid...     GI managed by [name] on numerous meds including: PPI (Protonix40) for reflux, Asacol 2Bid & Canasa suppos prn; also uses [place], Loperimide, and Phenergan as needed...     Lipids controlled on her [place] 4/15 showed TChol 136, TG 72, HDL 57, LDL 65... Continue same.    DM managed on her Metform500Bid + Glim73mQam-  Labs 4/15 showed BS=143, A1c=7.6..Marland KitchenMarland KitchenWe could incr her meds but I'd prefer better diet 7 wt loss first...    DJD & FM managed by her chr pain doctor whom she identifies as DrVoytek on Vicodin, Lidoderm, Relafen, Zanaflex...    She remains under a lot of stress and takes Xanax0.555mprn... We reviewed prob list, meds, xrays and labs> see below for updates >>   LABS 4/15:  FLP- within parameters on Lip40;  Chems- ok x BS=143 A1c=7.6;  CBC- ok w/ Hg=12.5 Fe=46 (12%);  TSH=1.72;  VitD=29;  Sed=21...  PFT 4/15 showed FVC=1.34 (64%), FEV1=1.13 (69%), %1sec=84, and mid-flows= 97% predicted... c/w mod restriction and she is instructed to decrease the weight & incr exercise...    ~  October 13, 2013:  90m59moV & JoaYarisbels established w/ DrFry for Primary Care... [facility] Suprax & Pred and told she needed a CT scan of her chest;  She indicates to me that she wanted to see DrSharma but he didn't have any openings so she called here & was seen by TP 10/07/13 c/o weakness, wheezing, congestion, SOB, & cough w/ clearing mucus on the Suprax;  CXR here was neg- no pneumonia seen;  She was asked to continue her meds & f/u w/ me... Today she says she is "sick as a dog" w/ mild cough, sm amt light beige sput, rattling congestion in her chest, and some SOB;  She tells me she wants Oxygen and the CT scan she was told that she needed by urgent care...     PMHx indicates hx AB and PFTs 4/15 showing restriction related to obesity & effort...     Currently taking NEBS w/ Albut Tid, Singulair10, Hycodan prn; she recently finished the Suprax & Pred20...     Exam reveals Afeb 98, HEENT- neg, Chest- bibasilar rhonchi w/o consolid or wheezing, no edema... We reviewed prob list, meds, xrays and labs> see below for updates >> she is followed by DrKBenay Spicer Cards and [name] for GI; her DM is managed by DrFry now...   CXR 10/07/13 showed cardiomeg, sl incr markings, mild ap pleural thickening, no airsp dis, NAD...  Ambulatory O2 sat monitor>  At Rest on RA- O2sat= 94% w/ pulse=71; after 3 laps- O2sat=93% w/ pulse=88... I explained that she does not qualify for home O2 (I offered to write for Home O2 for PRN use, self pay, but she declined)...   LABS 6/15:  Chems- ok x BS=176, A1c=9.0;  CBC- ok x Hg=12.1, WBC=13.9;  Sed=26...  PLAN> we decided to proceed w/ the CTChest and treat  w/ Depo80, Pred51m-3d tapering sched, Mucinex600-2Bid w/ Fluids, along w/ Tussionex & Tessalon perles as needed... We plan short term ROV 2wks. ADDENDUM> CT Chest 6/15 showed cardiomeg & CAD, lungs were ok- no nodules, adenopathy, pneumonia, or edema; renal  cysts noted (no change from 2010)...            Problem List:               ALLERGY (ICD-995.3) - on Saline, FLONASE 2spBid, etc; she had a neg ENT eval by DBarbaraann Faster11/13- chr rhinitis, she had a neg CT Sinus...  ASTHMA (ICD-493.90) - prev on Advair but she stopped on her own> she takes MUCINEX, NEBULIZER w/ Albut as needed & PROAIR Prn when out & about... she is an ex-smoker having quit in 1975, no recent exacerbations. ~  6/15: she had a refractory AB episode treated by UC w/ Suprax & Pred; then seen here w/ optimization of regimen, Depo/Pred, etc...   BRONCHIECTASIS (ICD-494.0) - CT Chest 3/10 showed bilat LL bronchiectasis L>R; advised to take meds regularly & avoid infections> NEBS, MUCINEX, Fluids, etc... ~  PFTs 4/15 showed FVC=1.34 (64%), FEV1=1.13 (69%), %1sec=84, and mid-flows= 97% predicted... c/w mod restriction and she is instructed to decrease the weight & incr exercise...  PULMONARY NODULE (ICD-518.89) - 1.4cm nodule in LLL showed up on Urology CTAbd 07/12/08>  CT Chest 07/22/08 confirmed 1.4cm peripheral LLL nodule that wasn't present on CT in 2005 & no other lesions, +bronchiectasis in both LLs L>R, mod cardiomegaly...  PET Scan 4/10 showed no abn hypermetabolic activity- therefore likely scar or granuloma... ~  CXR 2/11 showed cardiomeg, some COPD, NAD- LLL nodule seen on CT 3/10 is not vis on CXR. ~  CXR 1/12 in ER showed cardiomeg, vasc congestion, no overt edema, no nodule seen. ~  CXR 4/13 showed cardiomeg, calcif Ao, incr markings, no nodule seen, NAD..Marland Kitchen ~  CXR 8/13 via CSharon Hillshowed borderline heart size, sl incr interstitial markings (mild edema), calcif in Ao, NAD..Marland Kitchen ~  CXR 10/14 showed cardiomeg, mild peribronchial thickening, NAD, some thoracic spondylosis noted. ~  CXR 6/15 showed cardiomeg, sl incr markings, mild ap pleural thickening, no airsp dis, NAD..Marland KitchenMarland Kitchen~  CT Chest 6/15 showed cardiomeg & CAD, lungs were ok- no nodules, adenopathy, pneumonia, or edema; renal cysts  noted (no change from 2010).   SHE HAS ESTABLISHED w/ DrFRY FOR PRIMARY CARE >>   HYPERTENSION (ICD-401.9) - controlled on ATENOLOL 562md, NORVASC 1048m, LOSARTAN 100m72m LASIX 40mg84mtabs/d (2AM, 1PM), KCL 20Bid...  ~  9/12:  BP=138/80 today, not checking BP at home... denies HAs, visual changes, palipit, dizziness, syncope, dyspnea, edema, etc... ~  4/13:  BP= 126/74 & she is relatively asymptomatic but has mult somatic complaints... ~  4/14:  on Aten50, Amlodip10, Losar50, Lasix40-3/d, K20Bid, Imdur60, Plavix75; she has some CWP but no angina> BP today= 118/78 & she notes some atypCP, rare palpit, occas dizzy w/o syncope, +SOB/ DOE, & intermittent edema. ~  Note> 4/14 labs showed K=3.4, Mg=1.5, & she is requested to take the K20Bid & add Magox 400/d... ~  10/14: on Aten50, Amlodip10, Losar50, Lasix40-3/d, K20Bid, Imdur60, Plavix75; she has some CWP but no angina> BP today= 118/62 & she remains stable... ~  4/15: She is followed by DrKatBenay SpiceCards- HBP, CAD, Hx CP; she notes "I deal w/ it" and BP= 132/84 on her Aten50, Amlod10, Losar50, Imdur60, Lasix (40Tid) and K20Bid  CORONARY ARTERY DISEASE (ICD-414.00) - on PLAVIX 75mg/44mNTOL/ allergic to ASA) & IMDUR  49m daily... ~  she is s/p PTCA & stent to the LAD on 10/02... ~  last cath 2/07 w/ patent LAD stent, non-obstructive dis otherwise, EF=65%... ~  last NuclearStressTest 5/08 showed no ischemia, EF=64% (similiar to 2005)... ~  hosp in Jun09 w/ CP- cath showing non-obstructive CAD w/ 25-40% lesions in all 3 vessels, prev stent in LAD patent, norm LVF.. ~  Baseline EKG shows NSR, minor NSSTTWA, NAD... ~  Last Cards f/u 12/12> Cad, stent in LAD, c/o freq atypCPs, IMDUR increased to 663md- encouraged to incr exercise. ~  f/u [name] 12/12 showed prob normal stress nuclear study w/ mild reversible defect in anterior wall likely due to breast attenuation- cannot completely exclude ischemia. ~  3/14:  She saw DrKatz for f/u CAD,  DiastolicCHF, Hx syncope- she denied CP, no further dizziness; doing well on medical management, no changes made... ~  EKG 3/14 by DrBenay Spicehowed NSR, rate73, ?RAE, borderline tracing...  CHEST PAIN, ATYPICAL (ICD-786.59) - she has chronic CWP, prob related to her fibromyalgia... ~  1/12:  she went to ER, then [facility] w/ left CWP- cardiac evals neg, treated w/ rest, heat, Vicodin, Tussionex, etc... [facility]  HYPERCHOLESTEROLEMIA (ICD-272.0) - on LIPITOR 1026m, + diet Rx.. ~  FLPBingham[employer] University/08 showed TChol 132, TG 58, HDL 60, LDL 60 ~  FLP 7/09 showed TChol 116, TG 49, HDL 49, LDL 57 ~  FLP in hosp 3/10 showed TChol 130, TG 55, HDL 53, LDL 66 ~  FLP 2/11 on Cres5 showed TChol 104, TG 67, HDL 50, LDL 40 ~  Crestor 5mg73manged to Lipitor 10mg48m insurance co. ~  FLP 3/12 on Lip10 showed TChol 139, TG 64, HDL 57, LDL 69 ~  FLP 9/12 on Lip10 showed TChol 126, TG 79, HDL 54, LDL 57 ~  FLP 4/13 on Lip10 showed TChol 131, TG 95, HDL 52, LDL 60 ~  FLP 8/13 on Lip10 showed TChol 121, TG 68, HDL 49, LDL 58 ~  FLP 4/14 on Lip10 showed TChol 126, TG 92, HDL 44, LDL 64 ~  FLP 4/15 on Lip10 showed TChol 136, TG 72, HDL 57, LDL 65... Continue same  DIABETES MELLITUS (ICD-250.00) - on METFORMIN 500mgB36m+ low carb diet... ~  last labs 11/08 showed BS=120, HgA1c=6.2 ~  labs 6/09 in hosp showed BS=110-120, HgA1c= 6.6... samMarland KitchenMarland Kitchenmeds, needs better diet! ~  labs 3/10 in hosp showed BS= 113-155, HgA1c= 6.7 ~  labs 2/11 showed BS= 108, A1c= 7.1 ~  labs 1/12 in ER showed BS= 167... ~  labs 3/12 showed BS= 121, A1c= 7.2... conMarland KitchenMarland Kitchennue same meds, better diet! ~  Labs 9/12 showed BS= 131, A1c= 6.9... ConMarland KitchenMarland Kitchennue Metform500Bid ~  Labs 4/13 showed BS= 151, A1c= 8.2... NeeMarland KitchenMarland Kitchen more meds> add GLIMEPIRIDE 1mgQam75m 9/13:  She had neg Optometry f/u w/ DrMcFarland- no retinopathy, early cats noted... ~  10/13:  She is not currently on Metformin (prev 500mgBid28mthe Amaryl1mgQam);48me doesn't know why & I  presume stopped w/ contrast studies in hosp & never placed back on her med list at disch therefore lost; Asked to restart Metformin 500mgBid +40mmep1mg/d & RO57mevcheck 3-74mo... ~  431mo DM contol is adeq on Metform500Bid + Glimep1mgQam; labs91m14 showed BS=147, A1c=7.2... ~  Labs 10Marland Kitchen14 on Metform500Bid & Glim1mg showed  B35m31, A1c=7.5; same meds, needs better diet... ~  Labs 4/15 on Metform500Bid + Glim1mg showed BS=54m, A1c=7.6 and she needs sl incr meds but prefers diet &  exercise- get the weight down.  OBESITY (ICD-278.00)   ~  weight 11/09 = 204# ~  weight 3/10 = 208# ~  weight 2/11 = 205#... we reviewed diet + exercise program. ~  weight 1/12 = 208# ~  Weight 3/12 = 211#... We reviewed diet & exercise yet again. ~  Weight 9/12 = 216# ~  Weight 4/13 = 217#... We reviewed diet, exercise, wt reduction program. ~  Weight 10/13 = 213# ~  Weight 4/14 = 216# ~  Weight 10/14 = 217# ~  Weight 4/15 = 217#  GERD (ICD-530.81) - prev on Prilosec 6m- taking 1-2 daily but insurance won't pay- wanted Omeprazole 428md- ok; but Cards changed to [place]075m due to Plavix use...  DIVERTICULOSIS OF COLON (ICD-562.10) & COLONIC POLYPS (ICD-211.3) - last colonoscopy was 7/08 w/ divertics & colitis seen, + hyperplastic polyp removed... ~  1/13:  [name] noted that she is due for f/u surveillance colon but she wanted to put it off; encouraged to call his office for this important procedure... ~  7/14: she finally had her f/u colon by [name]> mild divertics in sigmoid, sm int hems, otherw neg; rec to continue hi fiber, Lialda2.4gm/d, & f/u 66yr576yr  Hx of ULCERATIVE COLITIS (ICD-556.9) - followed by [name] for GI- she has been on on Lialda, Rowasa, & Hydrocort enemas in the past... ~  seen 10/11 by [name] w/ freq loose stools- Asacol incr to 4tabs Tid & CANASA suppos for 13mo.68moHer maintenance regimen is ASACOL 400mg-9mbs Tid (but med list from [name] shows 4tabsTid) and CANASA suppos as  needed... ~  10/13:  She had a GI f/u w/ [name]> left sided UC w/ intermitt diarrhea, hems, hx adenomatous polyp; controlled on Asacol 4/d ~  7/14: she had GI f/u w/ AE for [name]> hx left colon UC & prev ademoatous polyps; under control clinically & f/u colon 7/14 as above- no active colitis; rec to continue the Asacol2.4gm/d...  MICROSCOPIC HEMATURIA (ICD-599.72),  NEOPLASM, KIDNEY (ICD-239.5),  STRESS INCONTINENCE (ICD-788.39) - eval and Rx by [name] 9/09 w/ stress incontinence, dysuria w/ microscopic hematuria-  Cysto w/ bladder bx was neg; and CT + Sonar w/ 1.8cm left upper pole renal lesion of ?etiology- poss tumor vs cyst (min enhancement, solid on sonar w/ min blood flow + 2 sm cysts also seen)...  they are following a watchful waiting protocol & repeat CT 3/10 showed stable 1.7cm category II cyst upper pole of left kidney without change... f/u planned 78yr. ~45yrnal Sonar 7/13 showed 2cm nodule (cyst) in upper pole of left kidney w/o change serially... ~  8/13:  She had f/u visit w/ [name]> hx hemorrhagic cystitis & treated w/ Septra, she was supposed to f/u w/ them...`  DEGENERATIVE JOINT DISEASE (ICD-715.90) >> she is followed by DrVoytek et al prev on Celebrex, Parafon, Lorcet... FIBROMYALGIA (ICD-729.1) >> now on VICODIN up to Tid & RELAFEN 750mg Bi4mn... LOW BACK PAIN >> prev shots from DrVoytek et al... ~  she was seen 12/11 w/ bilat foot pain & XRay showed arthritic changes- given DepoMedrol+Toradol shot IM. ~  11/12: seen by [name] for back & leg pain> MRI showed lumbar spondylosis & mod central canal stenosis; given ESI 11/12 w/ 75% improvement in pain. ~  She reports that ESI was repeated 3/13 & further improved by her hx... ~  She identifies DrVoytek as her chronic pain doctor=> seen 10/13 & her note is reviewed...  VITAMIN D DEFICIENCY (ICD-268.9) - Vit D  level 2/11= 11 and pt rec to take Vit D 50000 u weekly but ?never filled this Rx... therefore rec 8/11 to switch to OTC  Vit D 5000 u daily from the health food store...  ~  Labs 3/12 showed Vit D level = 46 ~  Labs 4/14 showed Vit D levewl = 39 & she is rec to take the 5000u OTC Vit D supplement every day... ~  Labs 4/15 showed Vit D = 29 & remainded to take the vit D supplenment ~5000u daily...  ANXIETY (ICD-300.00) - she has lots of stress caring for Momma... on ALPRAZOLAM 0.62m Tid Prn...  Hx of ANEMIA (ICD-285.9) - on NUIRON 1572md...  ~  labs 11/08 showed Hg=12.2, Fe=38/ TIBC=287/ sat=9%... ~  labs 3/10 in hosp showed Hg= 13.0, MCV= 92... ~  labs 2/11 showed Hg= 12.7, Fe= 67... ~  labs 1/12 in ER showed Hg= 11.7 ~  Labs 3/12 showed Hg= 12.3, MCV= 93 ~  Labs 9/12 showed Hg= 12.5, MCV= 94 ~  Labs 4/13 showed Hg= 12.7 ~  Labs 8/13 showed Hg= 11.5 ~  Labs 4/14 showed Hg= 12.3, MCV=92, Fe=59 (15%sat) & rec to continue daily iron supplement... ~  Labs 4/15 showed Hg= 12.5, MCV=93, Fe=46 (12%sat) & rec to restart FeSO4 daily...   Past Surgical History  Procedure Laterality Date  . Cholecystectomy    . Hand surgery  01/2006    Right Hand Surgery by Dr. GrAmedeo Plenty. Coronary stent placement  2002    BMS to the LAD  . Abdominal hysterectomy    . Cardiac catheterization  11/2011  . Colonoscopy w/ biopsies  10/2012    Multiple  . Esophagogastroduodenoscopy  12/2008    Outpatient Encounter Prescriptions as of 10/13/2013  Medication Sig  . ACCU-CHEK AVIVA PLUS test strip CHECK BLOOD SUGAR ONCE A DAY OR AS DIRECTED  . ACCU-CHEK FASTCLIX LANCETS MISC CHECK BLOOD SUGAR ONCE DAILY DX 250.00  . acetaminophen (TYLENOL) 500 MG tablet Take 500 mg by mouth every 6 (six) hours as needed.  . Marland Kitchenlbuterol (PROVENTIL) (2.5 MG/3ML) 0.083% nebulizer solution TAKE 3 MLS (2.5 MG TOTAL) BY NEBULIZATION 3 (THREE) TIMES DAILY AS NEEDED FOR WHEEZING.  . ALPRAZolam (XANAX) 0.5 MG tablet Take 1 tablet (0.5 mg total) by mouth 3 (three) times daily as needed for anxiety.  . Marland KitchenmLODipine (NORVASC) 10 MG tablet Take 1 tablet (10 mg total)  by mouth daily.  . Marland Kitchentenolol (TENORMIN) 50 MG tablet Take 1 tablet (50 mg total) by mouth daily.  . Marland Kitchentorvastatin (LIPITOR) 10 MG tablet TAKE 1 TABLET EVERY DAY  . cefdinir (OMNICEF) 300 MG capsule Take 1 capsule (300 mg total) by mouth 2 (two) times daily.  . Cholecalciferol (VITAMIN D-3) 5000 UNITS TABS Take 5,000 Units by mouth daily.    . clopidogrel (PLAVIX) 75 MG tablet Take 1 tablet (75 mg total) by mouth daily.  . Diphenhyd-Hydrocort-Nystatin (FIRST-DUKES MOUTHWASH) SUSP 1 tsp gargle and swallow four times daily as needed  . fluticasone (FLONASE) 50 MCG/ACT nasal spray PLACE 2 SPRAYS INTO THE NOSE AT BEDTIME.  . furosemide (LASIX) 40 MG tablet Take 1 tablet (40 mg total) by mouth 3 (three) times daily.  . Marland Kitchenlimepiride (AMARYL) 1 MG tablet TAKE 1 TABLET (1 MG TOTAL) BY MOUTH DAILY BEFORE BREAKFAST.  . Marland Kitchenlucose blood (ACCU-CHEK AVIVA) test strip Test blood sugar daily  . HYDROcodone-acetaminophen (NORCO/VICODIN) 5-325 MG per tablet TAKE 1/2 TO 1 TABLET THREE TIMES A DAY AS NEEDED FOR PAIN  . HYDROcodone-homatropine (HYCODAN)  5-1.5 MG/5ML syrup Take 1 tsp every 6 hours as needed for cough  . HYDROcodone-homatropine (HYCODAN) 5-1.5 MG/5ML syrup Take 5 mLs by mouth every 6 (six) hours as needed for cough.  . hydrOXYzine (ATARAX/VISTARIL) 25 MG tablet TAKE 1 TABLET BY MOUTH EVERY 4 HOURS AS NEEDED FOR ITCHING  . isosorbide mononitrate (IMDUR) 60 MG 24 hr tablet Take 1 tablet (60 mg total) by mouth daily.  Marland Kitchen LIALDA 1.2 G EC tablet TAKE 2 TABS TWICE DAILY  . LIDODERM 5 % APPLY 1 PATCH TO SKIN FOR 12 HOURS THEN REMOVE AND DISCARD PATCH WITHIN 72 HOURS OR AS DIRECTED  . Loperamide-Simethicone (IMODIUM MULTI-SYMPTOM RELIEF) 2-125 MG TABS Take 1 tablet by mouth as needed.  Marland Kitchen losartan (COZAAR) 50 MG tablet Take 1 tablet (50 mg total) by mouth daily.  . magnesium oxide (MAG-OX) 400 (241.3 MG) MG tablet TAKE 1 TABLET (400 MG TOTAL) BY MOUTH DAILY.  . mesalamine (ROWASA) 4 G enema Place enema rectally at  bedtime---Do for two weeks and then as needed  . metFORMIN (GLUCOPHAGE) 500 MG tablet TAKE 1 TABLET (500 MG TOTAL) BY MOUTH 2 (TWO) TIMES DAILY WITH A MEAL.  . montelukast (SINGULAIR) 10 MG tablet TAKE 1 TABLET AT BEDTIME  . nabumetone (RELAFEN) 750 MG tablet TAKE 1 TABLET BY MOUTH TWICE A DAY WITH A MEAL  . Naftifine HCl (NAFTIN) 2 % CREA Apply topically 2 (two) times daily.  Marland Kitchen neomycin-polymyxin-hydrocortisone (CORTISPORIN) otic solution Use 1 drop in affected ear three times daily as needed  . nitroGLYCERIN (NITROSTAT) 0.4 MG SL tablet Place 1 tablet (0.4 mg total) under the tongue every 5 (five) minutes as needed for chest pain.  Marland Kitchen nystatin (MYCOSTATIN) 100000 UNIT/ML suspension GARGLE & SWALLOW 1 TEASPOONFUL 4 TIMES A DAY  . pantoprazole (PROTONIX) 40 MG tablet TAKE 1 TABLET BY MOUTH DAILY AT 12 NOON.  Marland Kitchen potassium chloride SA (KLOR-CON M20) 20 MEQ tablet Take 1 tablet (20 mEq total) by mouth 2 (two) times daily.  . promethazine (PHENERGAN) 25 MG tablet Take 25 mg by mouth 2 (two) times daily as needed. For nausea  . tiZANidine (ZANAFLEX) 4 MG tablet TAKE 1 CAPSULE (4 MG TOTAL) BY MOUTH 3 (THREE) TIMES DAILY AS NEEDED FOR MUSCLE SPASMS.  . VENTOLIN HFA 108 (90 BASE) MCG/ACT inhaler TAKE 1 TO 2 PUFFS EVERY 6 HOURS AS NEEDED FOR WHEEZING    Allergies  Allergen Reactions  . Doxycycline Other (See Comments)    Unsteady gait  . Amoxicillin   . Aspirin Nausea And Vomiting  . Azithromycin     REACTION: pt states "ZPak doesn't work"  . Ciprofloxacin Nausea And Vomiting  . Codeine Nausea And Vomiting  . Levofloxacin     REACTION: chest pain  . Oxycodone-Acetaminophen     REACTION: vomiting  . Sulfamethizole   . Tramadol Hcl     REACTION: pt states "spaced-out"    Current Medications, Allergies, Past Medical History, Past Surgical History, Family History, and Social History were reviewed in [employer] record.    Review of Systems         See HPI - all other  systems neg except as noted...  The patient complains of weight gain, decreased hearing, chest pain, dyspnea on exertion, muscle weakness, and difficulty walking.  The patient denies anorexia, fever, weight loss, vision loss, hoarseness, syncope, peripheral edema, prolonged cough, headaches, hemoptysis, abdominal pain, melena, hematochezia, severe indigestion/heartburn, hematuria, incontinence, suspicious skin lesions, transient blindness, depression, unusual weight change, abnormal bleeding,  enlarged lymph nodes, and angioedema.     Objective:   Physical Exam      WD, Obese, 73 y/o BF in NAD... c/o pain in her left lat chest wall> no rash present. GENERAL:  Alert & oriented; pleasant & cooperative... HEENT:  Golden/AT, EOM-wnl, PERRLA, EACs-bilat cerumen, TMs-wnl, NOSE-clear, THROAT-clear & wnl. NECK:  Supple w/ fairROM; no JVD; normal carotid impulses w/o bruits; no thyromegaly or nodules palpated; no lymphadenopathy. CHEST:  Clear to P & A; bibasilar rhonchi w/o consolid... +trigger points along trapezius area and chest wall. HEART:  Regular Rhythm; without murmurs/ rubs/ or gallops heard... +tender to palp. ABDOMEN:  Obese, soft & nontender; normal bowel sounds; no organomegaly or masses detected. EXT: without deformities, mild arthritic changes; no varicose veins/ +venous insuffic/ tr edema. NEURO:  CN's intact;  no focal neuro deficits... DERM:  no shingles rash found...  RADIOLOGY DATA:  Reviewed in the EPIC EMR & discussed w/ the patient...  LABORATORY DATA:  Reviewed in the EPIC EMR & discussed w/ the patient...   Assessment & Plan:    Allergic Rhinitis>  She is on max meds for her chr rhinitis & has been eval by DrSharma for allergies, & DrRosen for ENT w/ neg CT Sinus...  ASTHMA/ AB, Bronchiectasis, Hx LL nodule>  She refuses to take meds regularly & is encouraged to use the Nebulizer Tid, Mucinex Bid, drink plenty of fluids, etc... 6/15 exac treated via UC w/ Suprax/ Pred;  requires optimized regimen w/ Depo/Pred taper, Mucinex, Singulair, NEBS, etc...    HBP>     Followed by Natale Milch, & [name]... CAD>   CHOL>   DM>   OBESITY>   GI> GU>  DJD/ FM/ LBP>   Vit D Defic>   Anxiety>  Anemia>

## 2013-10-13 NOTE — Telephone Encounter (Signed)
Seen 10/07/13 by TP C/o incr cough with clear gray/green colored mucus, PND, SOB, rattling in chest x 6 days Pt reports doing sinus rinses---feels that this has stopped working and it is now in her chest. Pt states that this is somewhat improved since 10/07/13 appt Denies fever  Pt requests OV with SN today-- Pt scheduled for appt with SN today at 930  Nothing further needed.

## 2013-10-13 NOTE — Patient Instructions (Signed)
Today we updated your med list in our EPIC system...    Continue your current medications the same...  For further evaluation> we will sched a CT scan of your chest...    We will contact you w/ the results when available...   In the meanwhile>>    Finish to Suprax antibiotic...    We are putting you back on the Prednisone 61m tabs- in a tapering schedule>>  Start w/ one tab twice daily for 3days,  Then take one tab each AM for 3days,    Then take 1/2 tab each AM for 3days,   Then take 1/2 tab EVERY OTHER DAY until gone...    We wrote for a good cough syrup- TUSSIONEX one tsp every 12h as needed for cough...    You may also use the TESSALON PERLES- 2 tabs up to 3 times daily as needed for cough...    You should continue the [place] 12059mtwice daily w/ lots of fluids...   Rest at home (do not go out in this heat), cut back on your diet (be sure low CARB, no sweets, etc) but increase your fluids/ water intake...  Let's plan a follow up recheck in 2 weeks...Marland KitchenMarland Kitchen

## 2013-10-14 ENCOUNTER — Ambulatory Visit (INDEPENDENT_AMBULATORY_CARE_PROVIDER_SITE_OTHER)
Admission: RE | Admit: 2013-10-14 | Discharge: 2013-10-14 | Disposition: A | Discharge status: Home / Self Care | Payer: Medicare Other | Source: Ambulatory Visit | Attending: Pulmonary Disease | Admitting: Pulmonary Disease

## 2013-10-14 DIAGNOSIS — J189 Pneumonia, unspecified organism: Secondary | ICD-10-CM

## 2013-10-14 DIAGNOSIS — R05 Cough: Secondary | ICD-10-CM | POA: Diagnosis not present

## 2013-10-14 DIAGNOSIS — R059 Cough, unspecified: Secondary | ICD-10-CM | POA: Diagnosis not present

## 2013-10-14 MED ORDER — IOHEXOL 300 MG/ML  SOLN
80.0000 mL | Freq: Once | INTRAMUSCULAR | Status: AC | PRN
  Administered 2013-10-14: 80 mL via INTRAVENOUS

## 2013-10-19 ENCOUNTER — Telehealth: Payer: Self-pay | Admitting: Pulmonary Disease

## 2013-10-19 NOTE — Progress Notes (Signed)
Quick Note:  Spoke with pt and notified of results per Dr. Lenna Gilford Pt verbalized understanding and denied any questions.  ______

## 2013-10-19 NOTE — Progress Notes (Signed)
Quick Note:  Spoke with pt and notified of results per Dr. Lenna Gilford. Pt verbalized understanding and denied any questions.  ______

## 2013-10-19 NOTE — Telephone Encounter (Signed)
Notes Recorded by Noralee Space, MD on 10/15/2013 at 8:53 AM Please notify patient>  CT Chest shows cardiomegaly & CAD- she should f/u w/ cardiology... Lungs are OK- no nodules, no pneumonia, no fluid...      Spoke with pt and notified of results per Dr. Lenna Gilford. Pt verbalized understanding and denied any questions. She reports her cough is no better since she was seen on 10/13/13  Still coughing up moderate green sputum and sounds very hoarse  She states that she is needing another abx Please advise, thanks! Allergies  Allergen Reactions  . Doxycycline Other (See Comments)    Unsteady gait  . Amoxicillin   . Aspirin Nausea And Vomiting  . Azithromycin     REACTION: pt states "ZPak doesn't work"  . Ciprofloxacin Nausea And Vomiting  . Codeine Nausea And Vomiting  . Levofloxacin     REACTION: chest pain  . Oxycodone-Acetaminophen     REACTION: vomiting  . Sulfamethizole   . Tramadol Hcl     REACTION: pt states "spaced-out"

## 2013-10-19 NOTE — Telephone Encounter (Signed)
LMTCB

## 2013-10-19 NOTE — Telephone Encounter (Signed)
Per SN---   Ref to ENT  Cannot give any further abx without a sputum culture.  Give her several sterile cups and have her collect a good morning  specimen and bring to the lab for C&S.

## 2013-10-20 ENCOUNTER — Telehealth: Payer: Self-pay | Admitting: Family Medicine

## 2013-10-20 DIAGNOSIS — J209 Acute bronchitis, unspecified: Secondary | ICD-10-CM | POA: Diagnosis not present

## 2013-10-20 DIAGNOSIS — R059 Cough, unspecified: Secondary | ICD-10-CM | POA: Diagnosis not present

## 2013-10-20 DIAGNOSIS — R05 Cough: Secondary | ICD-10-CM | POA: Diagnosis not present

## 2013-10-20 NOTE — Telephone Encounter (Signed)
Can you check into this referral? Dr. Sarajane Jews made a note that it was put in computer.

## 2013-10-20 NOTE — Telephone Encounter (Signed)
Pt calling to fu on referral to dr Romie Levee,  Pt states she has seen dr Lenna Gilford and he thinks its a good idea for her to see the allergy doc. Pls advise Pt would like a cb today. If possible. thanks

## 2013-10-21 NOTE — Telephone Encounter (Signed)
Per  allergy and asthma  Dr Donneta Romberg states that this patient need to be referred to a Pulmonary Dr. , Their office wont be able to see this pt just for chronic asthma without  testing the patient first . I informed Dr Sarajane Jews of this and he suggested the patient to Just follow up with pulmonary I called the pt to inform of her this and she has been made aware

## 2013-10-24 ENCOUNTER — Ambulatory Visit (INDEPENDENT_AMBULATORY_CARE_PROVIDER_SITE_OTHER): Payer: Medicare Other | Admitting: Internal Medicine

## 2013-10-24 ENCOUNTER — Encounter: Payer: Self-pay | Admitting: Internal Medicine

## 2013-10-24 VITALS — BP 122/72 | HR 70 | Temp 98.4°F | Wt 216.0 lb

## 2013-10-24 DIAGNOSIS — I2510 Atherosclerotic heart disease of native coronary artery without angina pectoris: Secondary | ICD-10-CM

## 2013-10-24 DIAGNOSIS — I2584 Coronary atherosclerosis due to calcified coronary lesion: Secondary | ICD-10-CM

## 2013-10-24 DIAGNOSIS — R059 Cough, unspecified: Secondary | ICD-10-CM | POA: Diagnosis not present

## 2013-10-24 DIAGNOSIS — R05 Cough: Secondary | ICD-10-CM | POA: Diagnosis not present

## 2013-10-24 DIAGNOSIS — R053 Chronic cough: Secondary | ICD-10-CM

## 2013-10-24 MED ORDER — FLUTICASONE FUROATE-VILANTEROL 100-25 MCG/INH IN AEPB: 1.0000 "application " | INHALATION_SPRAY | RESPIRATORY_TRACT | Status: DC

## 2013-10-24 MED ORDER — METHYLPREDNISOLONE ACETATE 80 MG/ML IJ SUSP
80.0000 mg | Freq: Once | INTRAMUSCULAR | Status: AC
  Administered 2013-10-24: 80 mg via INTRAMUSCULAR

## 2013-10-24 MED ORDER — MONTELUKAST SODIUM 10 MG PO TABS: ORAL_TABLET | ORAL | Status: DC

## 2013-10-24 MED ORDER — CEFUROXIME AXETIL 500 MG PO TABS: 500.0000 mg | ORAL_TABLET | Freq: Two times a day (BID) | ORAL | Status: DC

## 2013-10-24 NOTE — Progress Notes (Signed)
Pre visit review using our clinic review tool, if applicable. No additional management support is needed unless otherwise documented below in the visit note.

## 2013-10-24 NOTE — Progress Notes (Signed)
Chief Complaint  Patient presents with  . URI    cough over a month seen other provider not better     HPI: Patient comes in today for [facility] Saturday clinic for  ongoing problem evaluation. PCP has been dr Lenna Gilford and now  Dr Sarajane Jews x 1  Here with family member  Onset about    A  month ago  Worse  At onset  Feverish cough  Has been seen urgent care rx f or pna  With suprax?  [address]  And also [employer] .  Last week and dr Lenna Gilford 2 weeks ago  Gave depo shot   Had chest ct and  Said lungs better .  But still hoarse and chest very sore and hard from coughing so much cough .   Has to sit up.  At night  Albuterol haler nad machine .   ? Some help . Doesn't know what to do family worried and she feels exhausted  Not using cough drops no excess resp exposures no one else like this  ROS: See pertinent positives and negatives per HPI. No cp except above no inc edema no fever  No diarrhea rash uti sx  Has underlying FM CDCHF hx UC cad ht depression anxiety.  Past Medical History  Diagnosis Date  . CHF (congestive heart failure)     Diastolic  . Volume overload   . Bronchiectasis   . Hypertension   . CAD (coronary artery disease)     BMS to the LAD, 2002; cath 12/07/11 patent LAD stent and mild nonobstructive disease, EF 65%; Medical management  . Diabetes mellitus   . Obesity   . GERD (gastroesophageal reflux disease)   . Fibromyalgia   . Anxiety   . Allergy, unspecified not elsewhere classified   . Pulmonary nodule     Negative PET in 2010  . Other diseases of lung, not elsewhere classified   . Hypercholesterolemia   . Diverticulosis of colon   . Ulcerative colitis, left sided 2008    Hx of  . Adenomatous colon polyp 02/2006  . Microscopic hematuria   . Neoplasm of kidney   . Stress incontinence, female   . DJD (degenerative joint disease)   . Vitamin D deficiency disease   . Anemia   . Syncope   . UTI (lower urinary tract infection)   . Arthritis   . Pinched vertebral nerve    . Ejection fraction     Family History  Problem Relation Age of Onset  . Pneumonia Father   . Heart attack Father   . Parkinsonism Mother   . Sarcoidosis Brother   . Hypertension Sister   . Diabetes Sister   . Colon cancer Neg Hx   . Breast cancer Daughter     History   Social History  . Marital Status: Divorced    Spouse Name: N/A    Number of Children: N/A  . Years of Education: N/A   Occupational History  . Retired    Social History Main Topics  . Smoking status: Former Smoker -- 2.00 packs/day for 15 years    Types: Cigarettes    Quit date: 04/30/1973  . Smokeless tobacco: Never Used     Comment: ex-smoker: smoked for 10-15 years up to 2ppd, quit 1975.  Marland Kitchen Alcohol Use: No  . Drug Use: No  . Sexual Activity: No   Other Topics Concern  . None   Social History Narrative  . None  Outpatient Encounter Prescriptions as of 10/24/2013  Medication Sig  . ACCU-CHEK AVIVA PLUS test strip CHECK BLOOD SUGAR ONCE A DAY OR AS DIRECTED  . ACCU-CHEK FASTCLIX LANCETS MISC CHECK BLOOD SUGAR ONCE DAILY DX 250.00  . acetaminophen (TYLENOL) 500 MG tablet Take 500 mg by mouth every 6 (six) hours as needed.  Marland Kitchen albuterol (PROVENTIL) (2.5 MG/3ML) 0.083% nebulizer solution TAKE 3 MLS (2.5 MG TOTAL) BY NEBULIZATION 3 (THREE) TIMES DAILY AS NEEDED FOR WHEEZING.  . ALPRAZolam (XANAX) 0.5 MG tablet Take 1 tablet (0.5 mg total) by mouth 3 (three) times daily as needed for anxiety.  Marland Kitchen amLODipine (NORVASC) 10 MG tablet Take 1 tablet (10 mg total) by mouth daily.  Marland Kitchen atenolol (TENORMIN) 50 MG tablet Take 1 tablet (50 mg total) by mouth daily.  Marland Kitchen atorvastatin (LIPITOR) 10 MG tablet TAKE 1 TABLET EVERY DAY  . benzonatate (TESSALON) 100 MG capsule Take 1 capsule (100 mg total) by mouth every 6 (six) hours as needed for cough.  . chlorpheniramine-HYDROcodone (TUSSIONEX PENNKINETIC ER) 10-8 MG/5ML LQCR Take 5 mLs by mouth every 12 (twelve) hours as needed for cough.  . Cholecalciferol (VITAMIN  D-3) 5000 UNITS TABS Take 5,000 Units by mouth daily.    . clopidogrel (PLAVIX) 75 MG tablet Take 1 tablet (75 mg total) by mouth daily.  . Diphenhyd-Hydrocort-Nystatin (FIRST-DUKES MOUTHWASH) SUSP 1 tsp gargle and swallow four times daily as needed  . fluticasone (FLONASE) 50 MCG/ACT nasal spray PLACE 2 SPRAYS INTO THE NOSE AT BEDTIME.  . furosemide (LASIX) 40 MG tablet Take 1 tablet (40 mg total) by mouth 3 (three) times daily.  Marland Kitchen glimepiride (AMARYL) 1 MG tablet TAKE 1 TABLET (1 MG TOTAL) BY MOUTH DAILY BEFORE BREAKFAST.  Marland Kitchen glucose blood (ACCU-CHEK AVIVA) test strip Test blood sugar daily  . HYDROcodone-acetaminophen (NORCO/VICODIN) 5-325 MG per tablet TAKE 1/2 TO 1 TABLET THREE TIMES A DAY AS NEEDED FOR PAIN  . HYDROcodone-homatropine (HYCODAN) 5-1.5 MG/5ML syrup Take 1 tsp every 6 hours as needed for cough  . hydrOXYzine (ATARAX/VISTARIL) 25 MG tablet TAKE 1 TABLET BY MOUTH EVERY 4 HOURS AS NEEDED FOR ITCHING  . isosorbide mononitrate (IMDUR) 60 MG 24 hr tablet Take 1 tablet (60 mg total) by mouth daily.  Marland Kitchen LIALDA 1.2 G EC tablet TAKE 2 TABS TWICE DAILY  . LIDODERM 5 % APPLY 1 PATCH TO SKIN FOR 12 HOURS THEN REMOVE AND DISCARD PATCH WITHIN 72 HOURS OR AS DIRECTED  . Loperamide-Simethicone (IMODIUM MULTI-SYMPTOM RELIEF) 2-125 MG TABS Take 1 tablet by mouth as needed.  Marland Kitchen losartan (COZAAR) 50 MG tablet Take 1 tablet (50 mg total) by mouth daily.  . magnesium oxide (MAG-OX) 400 (241.3 MG) MG tablet TAKE 1 TABLET (400 MG TOTAL) BY MOUTH DAILY.  . mesalamine (ROWASA) 4 G enema Place enema rectally at bedtime---Do for two weeks and then as needed  . metFORMIN (GLUCOPHAGE) 500 MG tablet TAKE 1 TABLET (500 MG TOTAL) BY MOUTH 2 (TWO) TIMES DAILY WITH A MEAL.  . montelukast (SINGULAIR) 10 MG tablet TAKE 1 TABLET AT BEDTIME  . nabumetone (RELAFEN) 750 MG tablet TAKE 1 TABLET BY MOUTH TWICE A DAY WITH A MEAL  . Naftifine HCl (NAFTIN) 2 % CREA Apply topically 2 (two) times daily.  Marland Kitchen  neomycin-polymyxin-hydrocortisone (CORTISPORIN) otic solution Use 1 drop in affected ear three times daily as needed  . nitroGLYCERIN (NITROSTAT) 0.4 MG SL tablet Place 1 tablet (0.4 mg total) under the tongue every 5 (five) minutes as needed for chest pain.  Marland Kitchen  nystatin (MYCOSTATIN) 100000 UNIT/ML suspension GARGLE & SWALLOW 1 TEASPOONFUL 4 TIMES A DAY  . pantoprazole (PROTONIX) 40 MG tablet TAKE 1 TABLET BY MOUTH DAILY AT 12 NOON.  Marland Kitchen potassium chloride SA (KLOR-CON M20) 20 MEQ tablet Take 1 tablet (20 mEq total) by mouth 2 (two) times daily.  . predniSONE (DELTASONE) 20 MG tablet 1 po bid x 3 days, 1 daily x 3 days, 1/2 tablet daily x 3 days, 1/2 every other day until gone  . promethazine (PHENERGAN) 25 MG tablet Take 25 mg by mouth 2 (two) times daily as needed. For nausea  . tiZANidine (ZANAFLEX) 4 MG tablet TAKE 1 CAPSULE (4 MG TOTAL) BY MOUTH 3 (THREE) TIMES DAILY AS NEEDED FOR MUSCLE SPASMS.  . VENTOLIN HFA 108 (90 BASE) MCG/ACT inhaler TAKE 1 TO 2 PUFFS EVERY 6 HOURS AS NEEDED FOR WHEEZING  . [DISCONTINUED] montelukast (SINGULAIR) 10 MG tablet TAKE 1 TABLET AT BEDTIME  . cefUROXime (CEFTIN) 500 MG tablet Take 1 tablet (500 mg total) by mouth 2 (two) times daily.  . Fluticasone Furoate-Vilanterol (BREO ELLIPTA) 100-25 MCG/INH AEPB Inhale 1 application into the lungs 1 day or 1 dose.  . [EXPIRED] methylPREDNISolone acetate (DEPO-MEDROL) injection 80 mg     EXAM:  BP 122/72  Pulse 70  Temp(Src) 98.4 F (36.9 C) (Oral)  Wt 216 lb (97.977 kg)  SpO2 96%  Body mass index is 40.83 kg/(m^2). WDWN in NAD  quiet respirations; mildly congested  Quite hoarse. Non toxic .  No stridor Tied with intermittent wheezy bronchial cough productive of yellow phlegm observed no blood  HEENT: Normocephalic ;atraumatic , Eyes;  PERRL, EOMs  Full, lids and conjunctiva clear,,Ears: no deformities, canals nl, TM landmarks normal, Nose: no deformity or discharge but congested;face non tender Mouth : OP clear  without lesion or edema . Neck: Supple without adenopathy or masses or bruits Chest:  Clear to A without wheezes rales or rhonchi ? Exp wheeze? CV:  S1-S2 no gallops or murmurs peripheral perfusion is normal 1 + edema seems chronic    No jvd seen sitting  Skin :nl perfusion and no acute rashes   PSYCH: pleasant and cooperative, frustrated  no obvious depression or anxiety Record review chest ct done 2 weeks ago  IMPRESSION:  1. No acute cardiopulmonary disease.  2. Severe multivessel coronary artery atherosclerosis.  3. Dilated main pulmonary artery as can be seen with pulmonary  arterial hypertension.  4. Cardiomegaly.  5. There is a 2 cm complex left upper pole renal mass which is  incompletely characterized on this exam but is similar to the prior  exam of 07/22/2008. If there is further clinical concern, recommend  an MRI of the abdomen.   ASSESSMENT AND PLAN:  Discussed the following assessment and plan:  Persistent cough for 3 weeks or longer - 3+ providers visits ct and still miserable has underlying disease  will work with new PCP and get another pulm opinion antibioitc and steroid today - Plan: methylPREDNISolone acetate (DEPO-MEDROL) injection 80 mg  Coronary artery disease due to calcified coronary lesion Hx of rx by 3-4 different providers and places problematic and not better. Is on reflux med and other meds for "asthma " uncertain what is keeping this cough going but she looks exhausted . We need to help her get some pulmonary help but she is not interested iin goingback to see dr Lenna Gilford. For today   7 day antibiotic seroid sample of breo given  Fy Dr Sarajane Jews and will have  him review and decide on further pulmonary input   bronchiectesis is on problem list but not on ct and uncertain how this relates  -Patient advised to return or notify health care team  if symptoms worsen ,persist or new concerns arise. In the interim   Patient Instructions  I agree  That we need to get  some specialist help about your cough for a month. Antibiotic for poss exacerbation of asthma bronchitis . Add inhaler and rinse out mouth  Plan appt fu dr fry in  About 10 - 14 daysa  Will ask him about getting  Pulmonary help in the meantime .   [name]. Panosh [name]  Total visit 22mns > 50% spent counseling and coordinating care  Regarding causes hx and plan for fu of her cough has underlying disease  .  Importance of trying to coordinate care and stay in same system  and pt relieved that we are tying to get some help with this .

## 2013-10-24 NOTE — Patient Instructions (Addendum)
I agree  That we need to get some specialist help about your cough for a month. Antibiotic for poss exacerbation of asthma bronchitis . Add inhaler and rinse out mouth  Plan appt fu dr fry in  About 10 - 14 daysa  Will ask him about getting  Pulmonary help in the meantime .

## 2013-10-26 ENCOUNTER — Ambulatory Visit: Payer: Medicare Other | Admitting: Family Medicine

## 2013-10-26 ENCOUNTER — Telehealth: Payer: Self-pay | Admitting: Family Medicine

## 2013-10-26 DIAGNOSIS — J4540 Moderate persistent asthma, uncomplicated: Secondary | ICD-10-CM

## 2013-10-26 NOTE — Telephone Encounter (Signed)
The referral was done, please refill the meds listed

## 2013-10-26 NOTE — Telephone Encounter (Signed)
I spoke with pt and she would like the referral for pulmonary. Please send this note back, so that I can send in scripts.

## 2013-10-26 NOTE — Telephone Encounter (Signed)
Pt request 90 day  amLODipine (NORVASC) 10 MG tablet tiZANidine (ZANAFLEX) 4 MG tablet montelukast (SINGULAIR) 10 MG tablet [facility]

## 2013-10-26 NOTE — Telephone Encounter (Signed)
Mickel Baas received the notes for the referral to their office and states the pt needs to see pulmonary not allergy/asthma.

## 2013-10-27 NOTE — Telephone Encounter (Signed)
The referral was put in and I spoke with pt.

## 2013-10-27 NOTE — Telephone Encounter (Signed)
Did you already put in a referral for pulmonary?

## 2013-10-28 ENCOUNTER — Ambulatory Visit: Payer: Medicare Other | Admitting: Pulmonary Disease

## 2013-10-28 NOTE — Telephone Encounter (Signed)
I spoke with [facility] and pt has refills on all 3 of these medications.

## 2013-11-03 ENCOUNTER — Ambulatory Visit (INDEPENDENT_AMBULATORY_CARE_PROVIDER_SITE_OTHER): Payer: Medicare Other | Admitting: Family Medicine

## 2013-11-03 ENCOUNTER — Encounter: Payer: Self-pay | Admitting: Family Medicine

## 2013-11-03 VITALS — BP 142/80 | HR 77 | Temp 99.0°F | Ht 61.0 in | Wt 211.0 lb

## 2013-11-03 DIAGNOSIS — J209 Acute bronchitis, unspecified: Secondary | ICD-10-CM

## 2013-11-03 DIAGNOSIS — I2510 Atherosclerotic heart disease of native coronary artery without angina pectoris: Secondary | ICD-10-CM

## 2013-11-03 MED ORDER — ALPRAZOLAM 0.5 MG PO TABS: 0.5000 mg | ORAL_TABLET | Freq: Three times a day (TID) | ORAL | Status: DC | PRN

## 2013-11-03 MED ORDER — LEVOFLOXACIN 500 MG PO TABS: 500.0000 mg | ORAL_TABLET | Freq: Every day | ORAL | Status: AC

## 2013-11-03 MED ORDER — HYDROCOD POLST-CHLORPHEN POLST 10-8 MG/5ML PO LQCR: 5.0000 mL | Freq: Two times a day (BID) | ORAL | Status: DC | PRN

## 2013-11-03 MED ORDER — HYDROCODONE-ACETAMINOPHEN 5-325 MG PO TABS: ORAL_TABLET | ORAL | Status: DC

## 2013-11-03 MED ORDER — FIRST-DUKES MOUTHWASH MT SUSP: OROMUCOSAL | Status: DC

## 2013-11-03 NOTE — Progress Notes (Signed)
   Subjective:    Patient ID: [name], female    DOB: 11-30-1940, 73 y.o.   MRN: [id number]  HPI Here for continued coughing over the past 4 weeks. This produced green sputum at first but is now dry. No fevers. She saw Urgent Care and was given Suprax with no benefit. She saw Dr. Lenna Gilford and got a steroid shot with no benefit. Then she saw Dr. Regis Bill and was given Ceftin and another steroid shot, again with no benefit. She still has a hard constant cough which makes her chest sore. She had a chest CT on 10-13-13 which showed her lungs to be clear. She has an appt to see Dr. Harold Hedge for an allergy evaluation on 11-17-13.    Review of Systems  Constitutional: Negative.   HENT: Negative.   Eyes: Negative.   Respiratory: Positive for cough, chest tightness and wheezing.   Cardiovascular: Negative.        Objective:   Physical Exam  Constitutional: She appears well-developed and well-nourished.  Neck: Neck supple. No thyromegaly present.  Cardiovascular: Normal rate, regular rhythm, normal heart sounds and intact distal pulses.   Pulmonary/Chest: Effort normal and breath sounds normal. No respiratory distress. She has no wheezes. She has no rales.  Lymphadenopathy:    She has no cervical adenopathy.          Assessment & Plan:  This still seems to be a bronchitis, so we will try her on Levaquin 500 mg a day for 10 days. Refilled Tussionex to use prn.

## 2013-11-03 NOTE — Progress Notes (Signed)
Pre visit review using our clinic review tool, if applicable. No additional management support is needed unless otherwise documented below in the visit note. 

## 2013-11-11 ENCOUNTER — Telehealth: Payer: Self-pay | Admitting: Family Medicine

## 2013-11-11 MED ORDER — ALBUTEROL SULFATE (2.5 MG/3ML) 0.083% IN NEBU: INHALATION_SOLUTION | RESPIRATORY_TRACT | Status: DC

## 2013-11-11 NOTE — Telephone Encounter (Signed)
I sent script e-scribe.

## 2013-11-11 NOTE — Telephone Encounter (Signed)
[id number] [place] [place] albuterol (PROVENTIL) (2.5 MG/3ML) 0.083% nebulizer solution The pharmacist states the re-fill needs to have ICD 9 and specific instructions for the re-fill.

## 2013-11-17 DIAGNOSIS — J309 Allergic rhinitis, unspecified: Secondary | ICD-10-CM | POA: Diagnosis not present

## 2013-11-17 DIAGNOSIS — J45909 Unspecified asthma, uncomplicated: Secondary | ICD-10-CM | POA: Diagnosis not present

## 2013-11-17 DIAGNOSIS — R059 Cough, unspecified: Secondary | ICD-10-CM | POA: Diagnosis not present

## 2013-11-17 DIAGNOSIS — K219 Gastro-esophageal reflux disease without esophagitis: Secondary | ICD-10-CM | POA: Diagnosis not present

## 2013-11-17 DIAGNOSIS — R05 Cough: Secondary | ICD-10-CM | POA: Diagnosis not present

## 2013-11-18 ENCOUNTER — Other Ambulatory Visit: Payer: Self-pay | Admitting: Allergy and Immunology

## 2013-11-18 ENCOUNTER — Ambulatory Visit
Admission: RE | Admit: 2013-11-18 | Discharge: 2013-11-18 | Disposition: A | Discharge status: Home / Self Care | Payer: Medicare Other | Source: Ambulatory Visit | Attending: Allergy and Immunology | Admitting: Allergy and Immunology

## 2013-11-18 DIAGNOSIS — R05 Cough: Secondary | ICD-10-CM

## 2013-11-18 DIAGNOSIS — J988 Other specified respiratory disorders: Secondary | ICD-10-CM | POA: Diagnosis not present

## 2013-11-18 DIAGNOSIS — R059 Cough, unspecified: Secondary | ICD-10-CM

## 2013-11-18 DIAGNOSIS — R079 Chest pain, unspecified: Secondary | ICD-10-CM | POA: Diagnosis not present

## 2013-11-18 DIAGNOSIS — J309 Allergic rhinitis, unspecified: Secondary | ICD-10-CM | POA: Diagnosis not present

## 2013-11-18 DIAGNOSIS — J398 Other specified diseases of upper respiratory tract: Secondary | ICD-10-CM | POA: Diagnosis not present

## 2013-11-19 ENCOUNTER — Encounter (HOSPITAL_COMMUNITY): Payer: Self-pay | Admitting: Emergency Medicine

## 2013-11-19 ENCOUNTER — Emergency Department (HOSPITAL_COMMUNITY): Payer: Medicare Other

## 2013-11-19 ENCOUNTER — Emergency Department (HOSPITAL_COMMUNITY)
Admission: EM | Admit: 2013-11-19 | Discharge: 2013-11-19 | Disposition: A | Discharge status: Home / Self Care | Payer: Medicare Other | Attending: Emergency Medicine | Admitting: Emergency Medicine

## 2013-11-19 DIAGNOSIS — Z87448 Personal history of other diseases of urinary system: Secondary | ICD-10-CM | POA: Insufficient documentation

## 2013-11-19 DIAGNOSIS — Z9889 Other specified postprocedural states: Secondary | ICD-10-CM | POA: Diagnosis not present

## 2013-11-19 DIAGNOSIS — E119 Type 2 diabetes mellitus without complications: Secondary | ICD-10-CM | POA: Diagnosis not present

## 2013-11-19 DIAGNOSIS — E7800 Pure hypercholesterolemia, unspecified: Secondary | ICD-10-CM | POA: Insufficient documentation

## 2013-11-19 DIAGNOSIS — I2510 Atherosclerotic heart disease of native coronary artery without angina pectoris: Secondary | ICD-10-CM | POA: Insufficient documentation

## 2013-11-19 DIAGNOSIS — F411 Generalized anxiety disorder: Secondary | ICD-10-CM | POA: Diagnosis not present

## 2013-11-19 DIAGNOSIS — Z7902 Long term (current) use of antithrombotics/antiplatelets: Secondary | ICD-10-CM | POA: Diagnosis not present

## 2013-11-19 DIAGNOSIS — Z880 Allergy status to penicillin: Secondary | ICD-10-CM | POA: Diagnosis not present

## 2013-11-19 DIAGNOSIS — Z862 Personal history of diseases of the blood and blood-forming organs and certain disorders involving the immune mechanism: Secondary | ICD-10-CM | POA: Diagnosis not present

## 2013-11-19 DIAGNOSIS — M1990 Unspecified osteoarthritis, unspecified site: Secondary | ICD-10-CM | POA: Insufficient documentation

## 2013-11-19 DIAGNOSIS — Z79899 Other long term (current) drug therapy: Secondary | ICD-10-CM | POA: Diagnosis not present

## 2013-11-19 DIAGNOSIS — Y92009 Unspecified place in unspecified non-institutional (private) residence as the place of occurrence of the external cause: Secondary | ICD-10-CM | POA: Diagnosis not present

## 2013-11-19 DIAGNOSIS — Z860100 Personal history of colon polyps, unspecified: Secondary | ICD-10-CM | POA: Insufficient documentation

## 2013-11-19 DIAGNOSIS — E559 Vitamin D deficiency, unspecified: Secondary | ICD-10-CM | POA: Insufficient documentation

## 2013-11-19 DIAGNOSIS — Y9389 Activity, other specified: Secondary | ICD-10-CM | POA: Insufficient documentation

## 2013-11-19 DIAGNOSIS — W010XXA Fall on same level from slipping, tripping and stumbling without subsequent striking against object, initial encounter: Secondary | ICD-10-CM | POA: Insufficient documentation

## 2013-11-19 DIAGNOSIS — I509 Heart failure, unspecified: Secondary | ICD-10-CM | POA: Insufficient documentation

## 2013-11-19 DIAGNOSIS — Z792 Long term (current) use of antibiotics: Secondary | ICD-10-CM | POA: Insufficient documentation

## 2013-11-19 DIAGNOSIS — K219 Gastro-esophageal reflux disease without esophagitis: Secondary | ICD-10-CM | POA: Insufficient documentation

## 2013-11-19 DIAGNOSIS — R079 Chest pain, unspecified: Secondary | ICD-10-CM | POA: Diagnosis not present

## 2013-11-19 DIAGNOSIS — Z87440 Personal history of urinary (tract) infections: Secondary | ICD-10-CM | POA: Insufficient documentation

## 2013-11-19 DIAGNOSIS — S20219A Contusion of unspecified front wall of thorax, initial encounter: Secondary | ICD-10-CM | POA: Diagnosis not present

## 2013-11-19 DIAGNOSIS — Z8669 Personal history of other diseases of the nervous system and sense organs: Secondary | ICD-10-CM | POA: Diagnosis not present

## 2013-11-19 DIAGNOSIS — Z8709 Personal history of other diseases of the respiratory system: Secondary | ICD-10-CM | POA: Insufficient documentation

## 2013-11-19 DIAGNOSIS — IMO0002 Reserved for concepts with insufficient information to code with codable children: Secondary | ICD-10-CM | POA: Diagnosis not present

## 2013-11-19 DIAGNOSIS — S20211A Contusion of right front wall of thorax, initial encounter: Secondary | ICD-10-CM

## 2013-11-19 DIAGNOSIS — S298XXA Other specified injuries of thorax, initial encounter: Secondary | ICD-10-CM | POA: Diagnosis not present

## 2013-11-19 DIAGNOSIS — Z8742 Personal history of other diseases of the female genital tract: Secondary | ICD-10-CM | POA: Insufficient documentation

## 2013-11-19 DIAGNOSIS — Z87891 Personal history of nicotine dependence: Secondary | ICD-10-CM | POA: Insufficient documentation

## 2013-11-19 DIAGNOSIS — I10 Essential (primary) hypertension: Secondary | ICD-10-CM | POA: Diagnosis not present

## 2013-11-19 DIAGNOSIS — Z9861 Coronary angioplasty status: Secondary | ICD-10-CM | POA: Insufficient documentation

## 2013-11-19 DIAGNOSIS — E669 Obesity, unspecified: Secondary | ICD-10-CM | POA: Diagnosis not present

## 2013-11-19 DIAGNOSIS — W19XXXS Unspecified fall, sequela: Secondary | ICD-10-CM

## 2013-11-19 MED ORDER — HYDROCODONE-ACETAMINOPHEN 5-325 MG PO TABS: 1.0000 | ORAL_TABLET | ORAL | Status: DC | PRN

## 2013-11-19 MED ORDER — OXYCODONE-ACETAMINOPHEN 5-325 MG PO TABS
2.0000 | ORAL_TABLET | Freq: Once | ORAL | Status: AC
  Administered 2013-11-19: 2 via ORAL
  Filled 2013-11-19: qty 2

## 2013-11-19 NOTE — Discharge Instructions (Signed)
Chest Contusion A chest contusion is a deep bruise on your chest area. Contusions are the result of an injury that caused bleeding under the skin. A chest contusion may involve bruising of the skin, muscles, or ribs. The contusion may turn blue, purple, or yellow. Minor injuries will give you a painless contusion, but more severe contusions may stay painful and swollen for a few weeks. CAUSES  A contusion is usually caused by a blow, trauma, or direct force to an area of the body. SYMPTOMS   Swelling and redness of the injured area.  Discoloration of the injured area.  Tenderness and soreness of the injured area.  Pain. DIAGNOSIS  The diagnosis can be made by taking a history and performing a physical exam. An X-ray, CT scan, or MRI may be needed to determine if there were any associated injuries, such as broken bones (fractures) or internal injuries. TREATMENT  Often, the best treatment for a chest contusion is resting, icing, and applying cold compresses to the injured area. Deep breathing exercises may be recommended to reduce the risk of pneumonia. Over-the-counter medicines may also be recommended for pain control. HOME CARE INSTRUCTIONS   Put ice on the injured area.  Put ice in a plastic bag.  Place a towel between your skin and the bag.  Leave the ice on for 15-20 minutes, 03-04 times a day.  Only take over-the-counter or prescription medicines as directed by your caregiver. Your caregiver may recommend avoiding anti-inflammatory medicines (aspirin, ibuprofen, and naproxen) for 48 hours because these medicines may increase bruising.  Rest the injured area.  Perform deep-breathing exercises as directed by your caregiver.  Stop smoking if you smoke.  Do not lift objects over 5 pounds (2.3 kg) for 3 days or longer if recommended by your caregiver. SEEK IMMEDIATE MEDICAL CARE IF:   You have increased bruising or swelling.  You have pain that is getting worse.  You have  difficulty breathing.  You have dizziness, weakness, or fainting.  You have blood in your urine or stool.  You cough up or vomit blood.  Your swelling or pain is not relieved with medicines. MAKE SURE YOU:   Understand these instructions.  Will watch your condition.  Will get help right away if you are not doing well or get worse. Document Released: 01/09/2001 Document Revised: 01/09/2012 Document Reviewed: 10/08/2011 [facility] Patient Information 2015 [place], [place]. This information is not intended to replace advice given to you by your health care provider. Make sure you discuss any questions you have with your health care provider.

## 2013-11-19 NOTE — ED Notes (Signed)
Pt placed in bed. Pt not placed in gown (per RN). Pt monitored by pulse ox and bp cuff. Pt not monitored by 5-lead (per RN).

## 2013-11-19 NOTE — ED Notes (Signed)
Pt. fell while in bathroom at home  this evening , no LOC /ambulatory , reports pain ar right anterior ribcage /respirations unlabored.

## 2013-11-19 NOTE — ED Provider Notes (Signed)
CSN: [id number]     Arrival date & time 11/19/13  0048 History   First MD Initiated Contact with Patient 11/19/13 0326     Chief Complaint  Patient presents with  . Fall     (Consider location/radiation/quality/duration/timing/severity/associated sxs/prior Treatment) HPI  Patient is a very pleasant elderly woman with multiple chronic medical problems. She tripped, fell and at the right chest on the bathroom sink. Since then, she has had aching pain over the right lateral chest. She denies shortness of breath. She denies abdominal pain. She has pain at rest which is moderate in severity. Pain is worse with deep inspiration and movement of the torso.  Past Medical History  Diagnosis Date  . CHF (congestive heart failure)     Diastolic  . Volume overload   . Bronchiectasis   . Hypertension   . CAD (coronary artery disease)     BMS to the LAD, 2002; cath 12/07/11 patent LAD stent and mild nonobstructive disease, EF 65%; Medical management  . Diabetes mellitus   . Obesity   . GERD (gastroesophageal reflux disease)   . Fibromyalgia   . Anxiety   . Allergy, unspecified not elsewhere classified   . Pulmonary nodule     Negative PET in 2010  . Other diseases of lung, not elsewhere classified   . Hypercholesterolemia   . Diverticulosis of colon   . Ulcerative colitis, left sided 2008    Hx of  . Adenomatous colon polyp 02/2006  . Microscopic hematuria   . Neoplasm of kidney   . Stress incontinence, female   . DJD (degenerative joint disease)   . Vitamin D deficiency disease   . Anemia   . Syncope   . UTI (lower urinary tract infection)   . Arthritis   . Pinched vertebral nerve   . Ejection fraction    Past Surgical History  Procedure Laterality Date  . Cholecystectomy    . Hand surgery  01/2006    Right Hand Surgery by Dr. Amedeo Plenty  . Coronary stent placement  2002    BMS to the LAD  . Abdominal hysterectomy    . Cardiac catheterization  11/2011  . Colonoscopy w/ biopsies   10/2012    Multiple  . Esophagogastroduodenoscopy  12/2008   Family History  Problem Relation Age of Onset  . Pneumonia Father   . Heart attack Father   . Parkinsonism Mother   . Sarcoidosis Brother   . Hypertension Sister   . Diabetes Sister   . Colon cancer Neg Hx   . Breast cancer Daughter    History  Substance Use Topics  . Smoking status: Former Smoker -- 2.00 packs/day for 15 years    Types: Cigarettes    Quit date: 04/30/1973  . Smokeless tobacco: Never Used     Comment: ex-smoker: smoked for 10-15 years up to 2ppd, quit 1975.  Marland Kitchen Alcohol Use: No   OB History   Grav Para Term Preterm Abortions TAB SAB Ect Mult Living                 Review of Systems  Ten point review of symptoms performed and is negative with the exception of symptoms noted above.   Allergies  Doxycycline; Amoxicillin; Aspirin; Azithromycin; Ciprofloxacin; Codeine; Oxycodone-acetaminophen; Sulfamethizole; and Tramadol hcl  Home Medications   Prior to Admission medications   Medication Sig Start Date End Date Taking? Authorizing Provider  ACCU-CHEK AVIVA PLUS test strip CHECK BLOOD SUGAR ONCE A DAY OR AS DIRECTED  Noralee Space, MD  ACCU-CHEK FASTCLIX LANCETS MISC CHECK BLOOD SUGAR ONCE DAILY DX 250.00 07/20/13   Noralee Space, MD  acetaminophen (TYLENOL) 500 MG tablet Take 500 mg by mouth every 6 (six) hours as needed.    Historical Provider, MD  albuterol (PROVENTIL) (2.5 MG/3ML) 0.083% nebulizer solution TAKE 3 MLS (2.5 MG TOTAL) BY NEBULIZATION 3 (THREE) TIMES DAILY AS NEEDED FOR WHEEZING. 11/11/13   [name], MD  ALPRAZolam Duanne Moron) 0.5 MG tablet Take 1 tablet (0.5 mg total) by mouth 3 (three) times daily as needed for anxiety. 11/03/13   [name], MD  amLODipine (NORVASC) 10 MG tablet Take 1 tablet (10 mg total) by mouth daily. 08/21/13   Carlena Bjornstad, MD  atenolol (TENORMIN) 50 MG tablet Take 1 tablet (50 mg total) by mouth daily. 08/21/13   Carlena Bjornstad, MD  atorvastatin (LIPITOR)  10 MG tablet TAKE 1 TABLET EVERY DAY 08/21/13   Carlena Bjornstad, MD  benzonatate (TESSALON) 100 MG capsule Take 1 capsule (100 mg total) by mouth every 6 (six) hours as needed for cough. 10/13/13   Noralee Space, MD  cefUROXime (CEFTIN) 500 MG tablet Take 1 tablet (500 mg total) by mouth 2 (two) times daily. 10/24/13   Burnis Medin, MD  chlorpheniramine-HYDROcodone (TUSSIONEX PENNKINETIC ER) 10-8 MG/5ML LQCR Take 5 mLs by mouth every 12 (twelve) hours as needed for cough. 11/03/13   [name], MD  Cholecalciferol (VITAMIN D-3) 5000 UNITS TABS Take 5,000 Units by mouth daily.      Historical Provider, MD  clopidogrel (PLAVIX) 75 MG tablet Take 1 tablet (75 mg total) by mouth daily. 08/21/13   Carlena Bjornstad, MD  Diphenhyd-Hydrocort-Nystatin (FIRST-DUKES MOUTHWASH) SUSP 1 tsp gargle and swallow four times daily as needed 11/03/13   [name], MD  fluticasone [facility]) 50 MCG/ACT nasal spray PLACE 2 SPRAYS INTO THE NOSE AT BEDTIME. 07/06/13   Noralee Space, MD  Fluticasone Furoate-Vilanterol (BREO ELLIPTA) 100-25 MCG/INH AEPB Inhale 1 application into the lungs 1 day or 1 dose. 10/24/13   Burnis Medin, MD  furosemide (LASIX) 40 MG tablet Take 1 tablet (40 mg total) by mouth 3 (three) times daily. 08/21/13   Carlena Bjornstad, MD  glimepiride (AMARYL) 1 MG tablet TAKE 1 TABLET (1 MG TOTAL) BY MOUTH DAILY BEFORE BREAKFAST. 05/20/13   Noralee Space, MD  glucose blood (ACCU-CHEK AVIVA) test strip Test blood sugar daily 08/11/13   Noralee Space, MD  HYDROcodone-acetaminophen (NORCO/VICODIN) 5-325 MG per tablet TAKE 1/2 TO 1 TABLET THREE TIMES A DAY AS NEEDED FOR PAIN 11/03/13   [name], MD  HYDROcodone-acetaminophen (NORCO/VICODIN) 5-325 MG per tablet Take 1-2 tablets by mouth every 4 (four) hours as needed for moderate pain or severe pain. 11/19/13   Elyn Peers, MD  HYDROcodone-homatropine [facility]) 5-1.5 MG/5ML syrup Take 1 tsp every 6 hours as needed for cough 02/17/13   Noralee Space, MD  hydrOXYzine  (ATARAX/VISTARIL) 25 MG tablet TAKE 1 TABLET BY MOUTH EVERY 4 HOURS AS NEEDED FOR ITCHING 09/28/13   [name], MD  isosorbide mononitrate (IMDUR) 60 MG 24 hr tablet Take 1 tablet (60 mg total) by mouth daily. 08/21/13   Carlena Bjornstad, MD  LIALDA 1.2 G EC tablet TAKE 2 TABS TWICE DAILY    Amy S Esterwood, PA-C  LIDODERM 5 % APPLY 1 PATCH TO SKIN FOR 12 HOURS THEN REMOVE AND DISCARD PATCH WITHIN 72 HOURS  OR AS DIRECTED 03/10/13   Noralee Space, MD  Loperamide-Simethicone (IMODIUM MULTI-SYMPTOM RELIEF) 2-125 MG TABS Take 1 tablet by mouth as needed.    Historical Provider, MD  losartan (COZAAR) 50 MG tablet Take 1 tablet (50 mg total) by mouth daily. 08/21/13   Carlena Bjornstad, MD  magnesium oxide (MAG-OX) 400 (241.3 MG) MG tablet TAKE 1 TABLET (400 MG TOTAL) BY MOUTH DAILY. 09/28/13   [name], MD  mesalamine [facility]) 4 G enema Place enema rectally at bedtime---Do for two weeks and then as needed 08/17/13   Willia Craze, NP  metFORMIN (GLUCOPHAGE) 500 MG tablet TAKE 1 TABLET (500 MG TOTAL) BY MOUTH 2 (TWO) TIMES DAILY WITH A MEAL. 09/28/13   [name], MD  montelukast (SINGULAIR) 10 MG tablet TAKE 1 TABLET AT BEDTIME 10/24/13   Burnis Medin, MD  nabumetone (RELAFEN) 750 MG tablet TAKE 1 TABLET BY MOUTH TWICE A DAY WITH A MEAL 10/24/12   Noralee Space, MD  Naftifine HCl (NAFTIN) 2 % CREA Apply topically 2 (two) times daily.    Historical Provider, MD  neomycin-polymyxin-hydrocortisone (CORTISPORIN) otic solution Use 1 drop in affected ear three times daily as needed 02/06/12   Noralee Space, MD  nitroGLYCERIN (NITROSTAT) 0.4 MG SL tablet Place 1 tablet (0.4 mg total) under the tongue every 5 (five) minutes as needed for chest pain. 08/21/13   Carlena Bjornstad, MD  nystatin (MYCOSTATIN) 100000 UNIT/ML suspension GARGLE & SWALLOW 1 TEASPOONFUL 4 TIMES A DAY 03/14/12   Noralee Space, MD  pantoprazole (PROTONIX) 40 MG tablet TAKE 1 TABLET BY MOUTH DAILY AT 12 NOON.    Carlena Bjornstad, MD  potassium  chloride SA (KLOR-CON M20) 20 MEQ tablet Take 1 tablet (20 mEq total) by mouth 2 (two) times daily. 08/21/13   Carlena Bjornstad, MD  predniSONE (DELTASONE) 20 MG tablet 1 po bid x 3 days, 1 daily x 3 days, 1/2 tablet daily x 3 days, 1/2 every other day until gone 10/13/13   Noralee Space, MD  promethazine (PHENERGAN) 25 MG tablet Take 25 mg by mouth 2 (two) times daily as needed. For nausea 01/02/11   Noralee Space, MD  tiZANidine (ZANAFLEX) 4 MG tablet TAKE 1 CAPSULE (4 MG TOTAL) BY MOUTH 3 (THREE) TIMES DAILY AS NEEDED FOR MUSCLE SPASMS. 09/28/13   [name], MD  VENTOLIN HFA 108 (90 BASE) MCG/ACT inhaler TAKE 1 TO 2 PUFFS EVERY 6 HOURS AS NEEDED FOR WHEEZING 05/25/13   Noralee Space, MD   BP 140/80  Pulse 80  Temp(Src) 97.9 F (36.6 C) (Oral)  Resp 22  Ht _0  (1.549 m)  Wt 212 lb (96.163 kg)  BMI 40.08 kg/m2  SpO2 95% Physical Exam Gen: well developed and well nourished appearing Head: NCAT Eyes: PERL, EOMI Nose: no epistaixis or rhinorrhea Mouth/throat: mucosa is moist and pink Neck: No C-spine tenderness Chest wall: Point tenderness to palpation over the anterior lower most ribs at the midaxillary line. No crepitus. No bruising or lacerations or abrasions noted to the chest wall. Lungs: CTA B, no wheezing, rhonchi or rales CV: RRR, no murmur, extremities appear well perfused.  Abd: soft, notender, nondistended Back: No spine tenderness Skin: warm and dry Ext: normal to inspection, no dependent edema Neuro: CN ii-xii grossly intact, no focal deficits Psyche; normal affect,  calm and cooperative.   ED Course  Procedures (including critical care time) Labs Review Labs Reviewed - No  data to display  Imaging Review Dg Chest 2 View  11/18/2013   CLINICAL DATA:  Cough, left-sided chest pain  EXAM: CHEST  2 VIEW  COMPARISON:  10/20/2013  FINDINGS: Mild cardiac enlargement stable. Vascular pattern normal. Mild airway wall thickening in the bilateral perihilar areas, stable. No  consolidation effusion or pneumothorax.  IMPRESSION: Stable appearance from prior study with no acute findings. Stable cardiac enlargement and airway wall thickening.   Electronically Signed   By: Skipper Cliche [name]   On: 11/18/2013 10:47   Dg Ribs Unilateral Left  11/18/2013   CLINICAL DATA:  Left-sided pain with cough.  EXAM: LEFT RIBS - 2 VIEW  COMPARISON:  Chest radiograph 11/18/2013  FINDINGS: A BB was placed in the left lower chest. Negative for a left pneumothorax. No evidence for a displaced left rib fracture. Atherosclerotic calcifications at the aortic arch. The visualized left humerus is intact. Mild degenerative changes at the left [facility] joint.  IMPRESSION: No evidence for a displaced left rib fracture.   Electronically Signed   By: Markus Daft [name]   On: 11/18/2013 10:44   Dg Ribs Unilateral W/chest Right  11/19/2013   CLINICAL DATA:  Right lower rib pain after falling. Increased pain with movement and breathing.  EXAM: RIGHT RIBS AND CHEST - 3+ VIEW  COMPARISON:  11/18/2013 and 10/20/2013.  FINDINGS: A metallic BB was placed over the area of pain along the anterior costal margin on the right. The heart is enlarged. There is chronic vascular congestion without overt pulmonary edema or confluent airspace opacity. There is no evidence of acute right-sided rib fracture or pneumothorax.  IMPRESSION: No evidence of right-sided rib fracture, pleural effusion or pneumothorax. Stable cardiomegaly and vascular congestion.   Electronically Signed   By: Camie Patience [name]   On: 11/19/2013 01:39      MDM   Final diagnoses:  Contusion of rib on right side, initial encounter  Fall, sequela        Elyn Peers, MD 11/19/13 [phone]

## 2013-11-19 NOTE — ED Notes (Signed)
MD at bedside. 

## 2013-11-19 NOTE — ED Notes (Signed)
Patient transported to X-ray

## 2013-11-26 DIAGNOSIS — M47812 Spondylosis without myelopathy or radiculopathy, cervical region: Secondary | ICD-10-CM | POA: Diagnosis not present

## 2013-11-30 ENCOUNTER — Other Ambulatory Visit: Payer: Self-pay | Admitting: Cardiology

## 2013-12-08 IMAGING — CT CT HEAD W/O CM
2 series · 17 of 30 positions shown, 20 images · non-contrast
Comparison: 06/25/2009.

CLINICAL DATA: Loss of consciousness.  Fell.

CT HEAD WITHOUT CONTRAST
TECHNIQUE: Contiguous axial images were obtained from the base of
the skull through the vertex without contrast.

[Series 2: head w/o · axial · non-contrast · 0.43mm/px · z∈[-139,-24]mm · 9 of 29 slices shown, 12 images]
[im 3/29  brain]
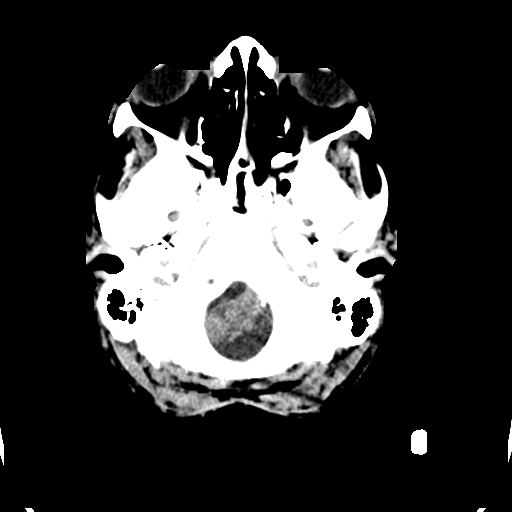
[im 3/29  bone]
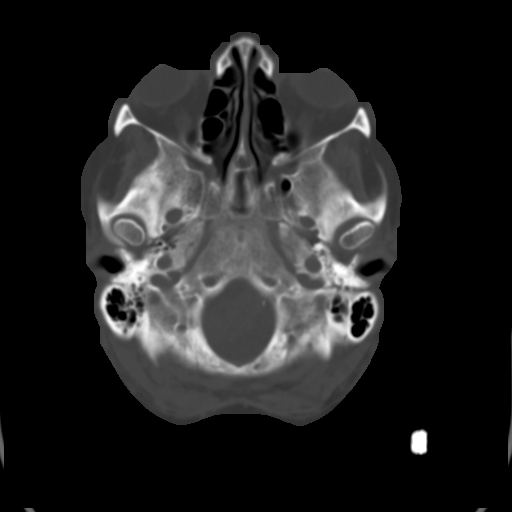
[im 6/29  brain]
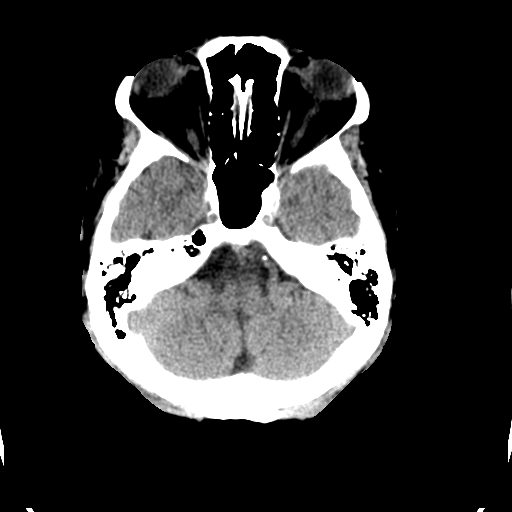
[im 9/29  brain]
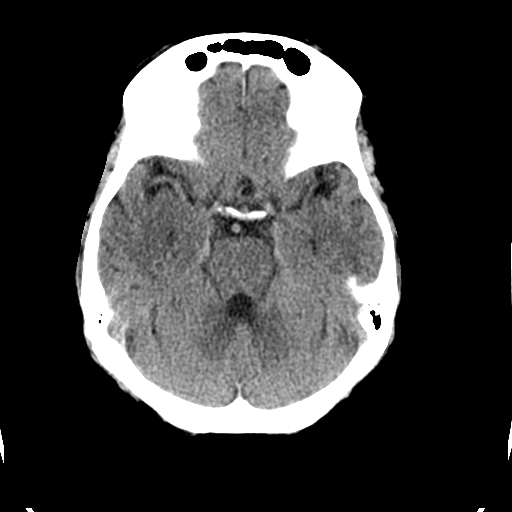
[im 12/29  brain]
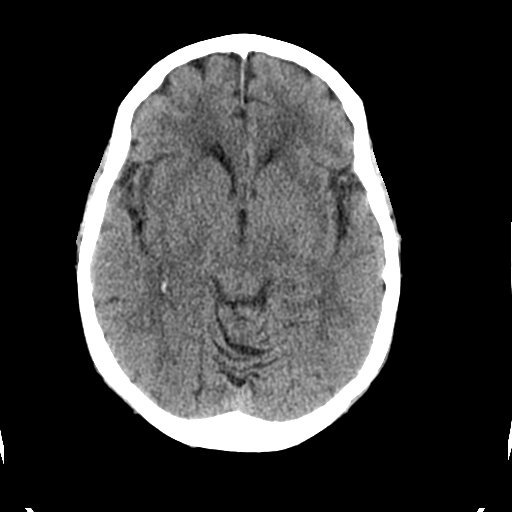
[im 15/29  brain]
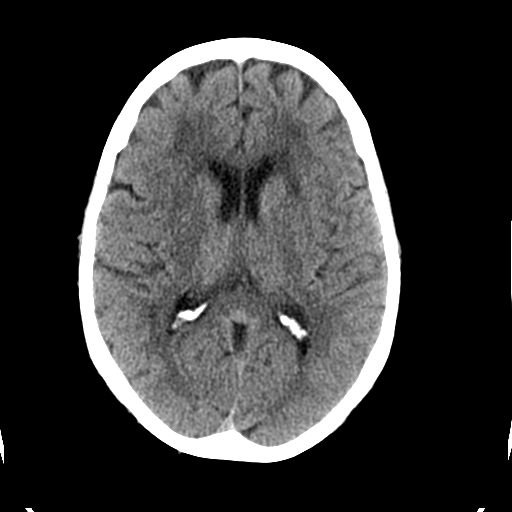
[im 15/29  bone]
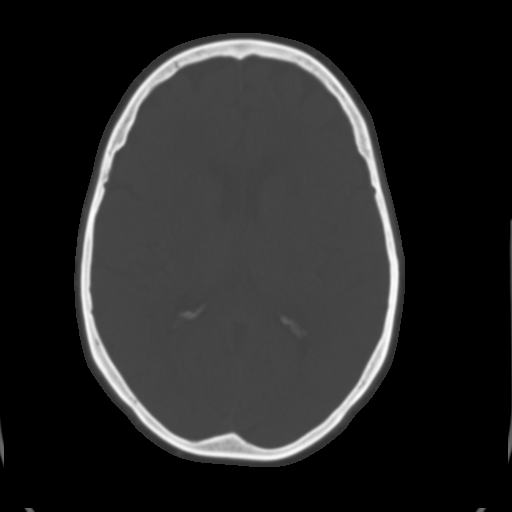
[im 17/29  brain]
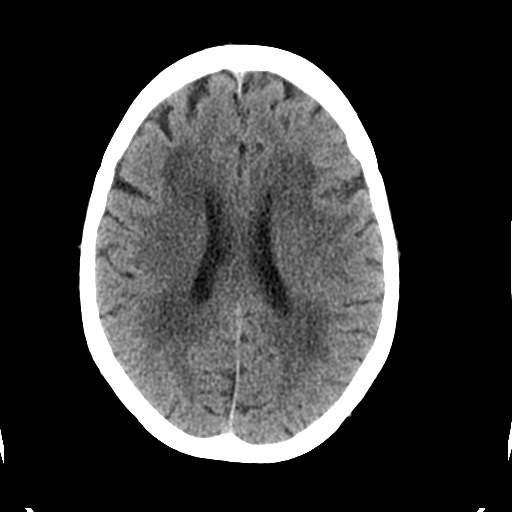
[im 20/29  brain]
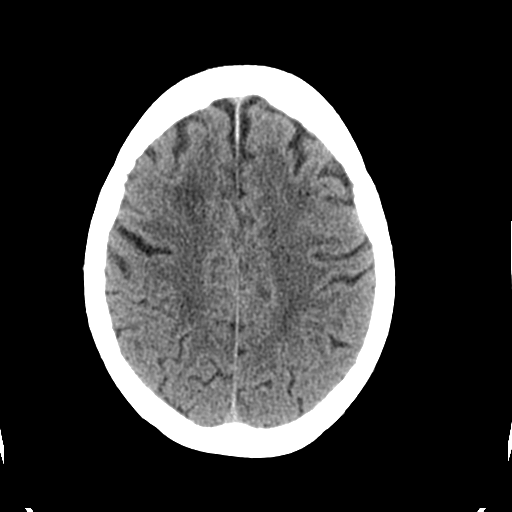
[im 23/29  brain]
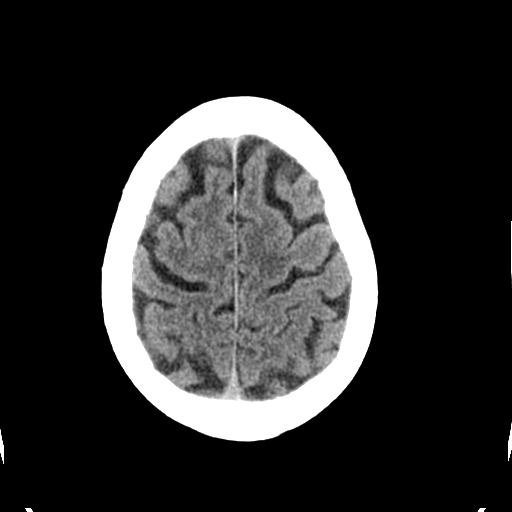
[im 26/29  brain]
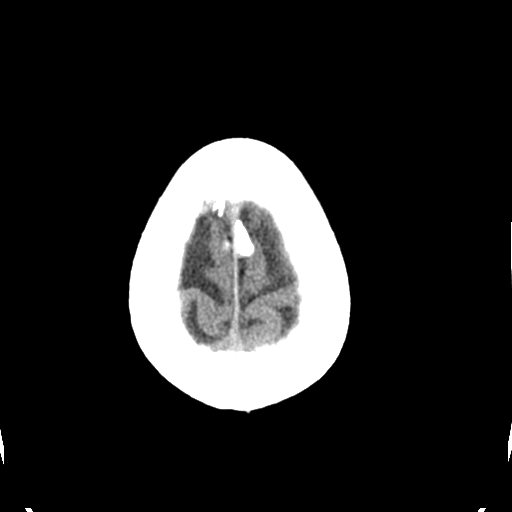
[im 26/29  bone]
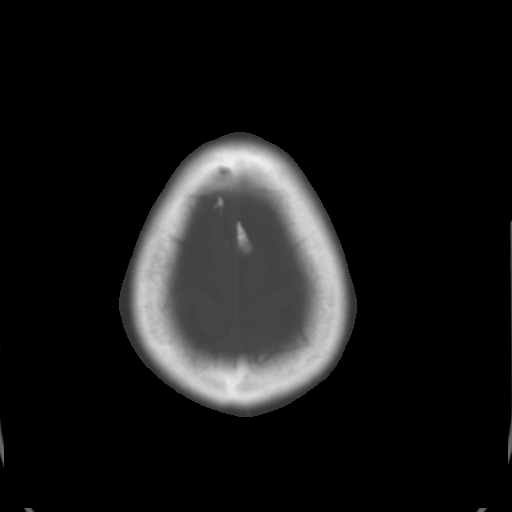

[Series 3: bone windows · axial · 0.43mm/px · z∈[-134,-26]mm · 8 of 48 slices shown]
[im 6/48  bone]
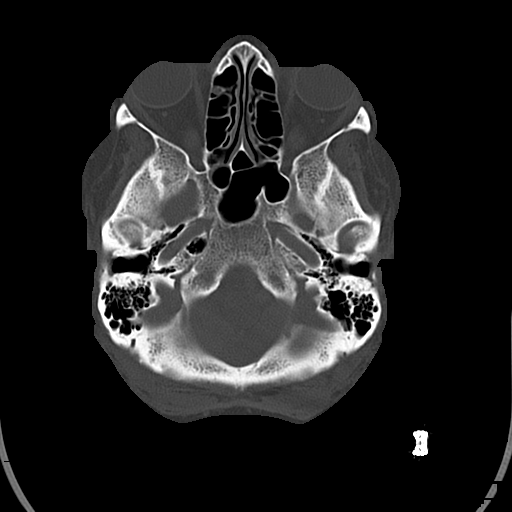
[im 11/48  bone]
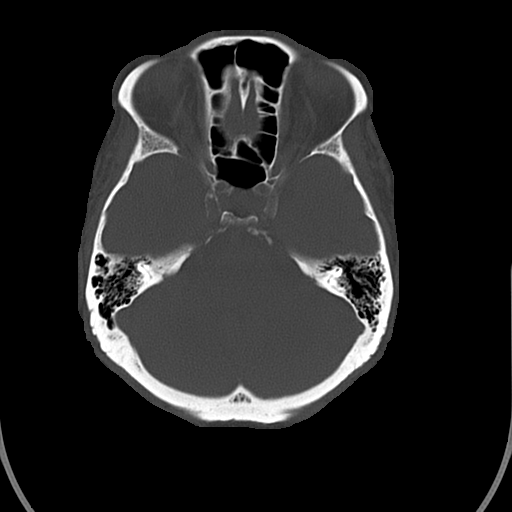
[im 16/48  bone]
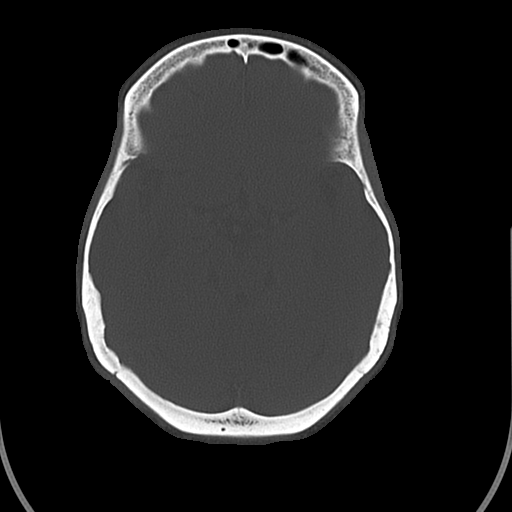
[im 21/48  bone]
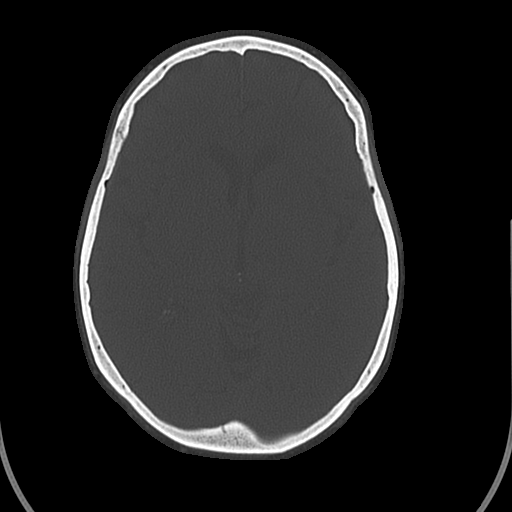
[im 27/48  bone]
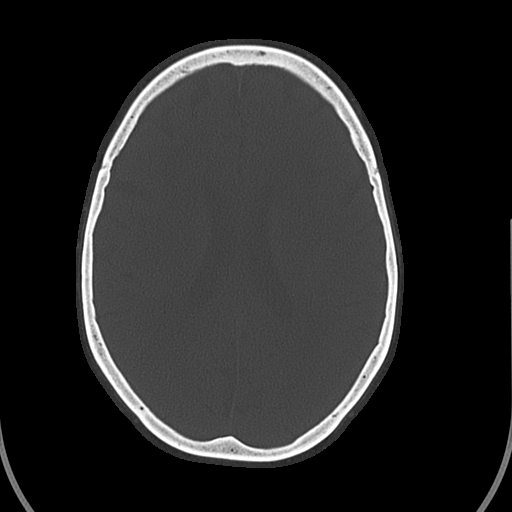
[im 32/48  bone]
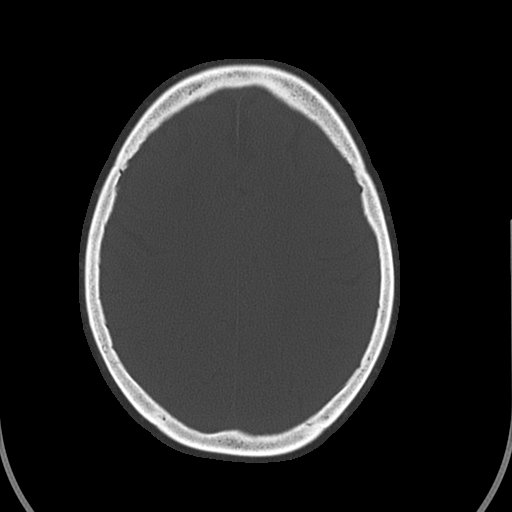
[im 37/48  bone]
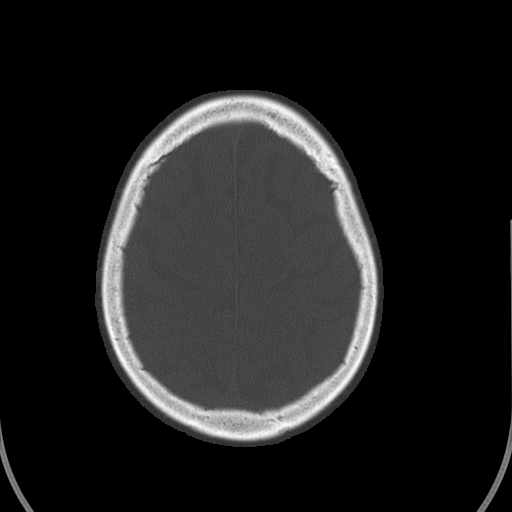
[im 42/48  bone]
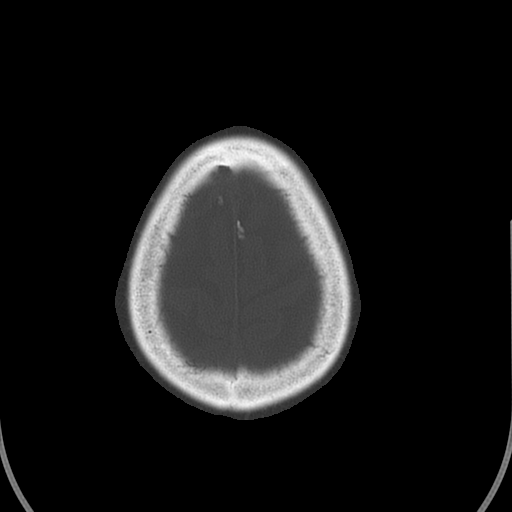

[17 of 30 positions shown; findings below may reference images not displayed]

FINDINGS: The ventricles are normal and stable for age.  There is
fairly significant periventricular white matter disease but this
appears stable also.  No acute intracranial findings such as
hemispheric infarction or intracranial hemorrhage.  No mass
lesions.  The brainstem and cerebellum appear normal and stable.

The bony structures are intact.  No skull fracture.  Stable tiny
radiopaque foreign bodies in the scalp at the posterior apex.
IMPRESSION: 1.  No acute intracranial findings.
2.  Stable advanced periventricular white matter disease.

## 2013-12-08 IMAGING — CR DG CHEST 2V
2 series · 2 of 2 positions shown · non-contrast
Comparison: 08/03/2011.

CLINICAL DATA: Shortness of breath.

CHEST - 2 VIEW

[w chest lat]
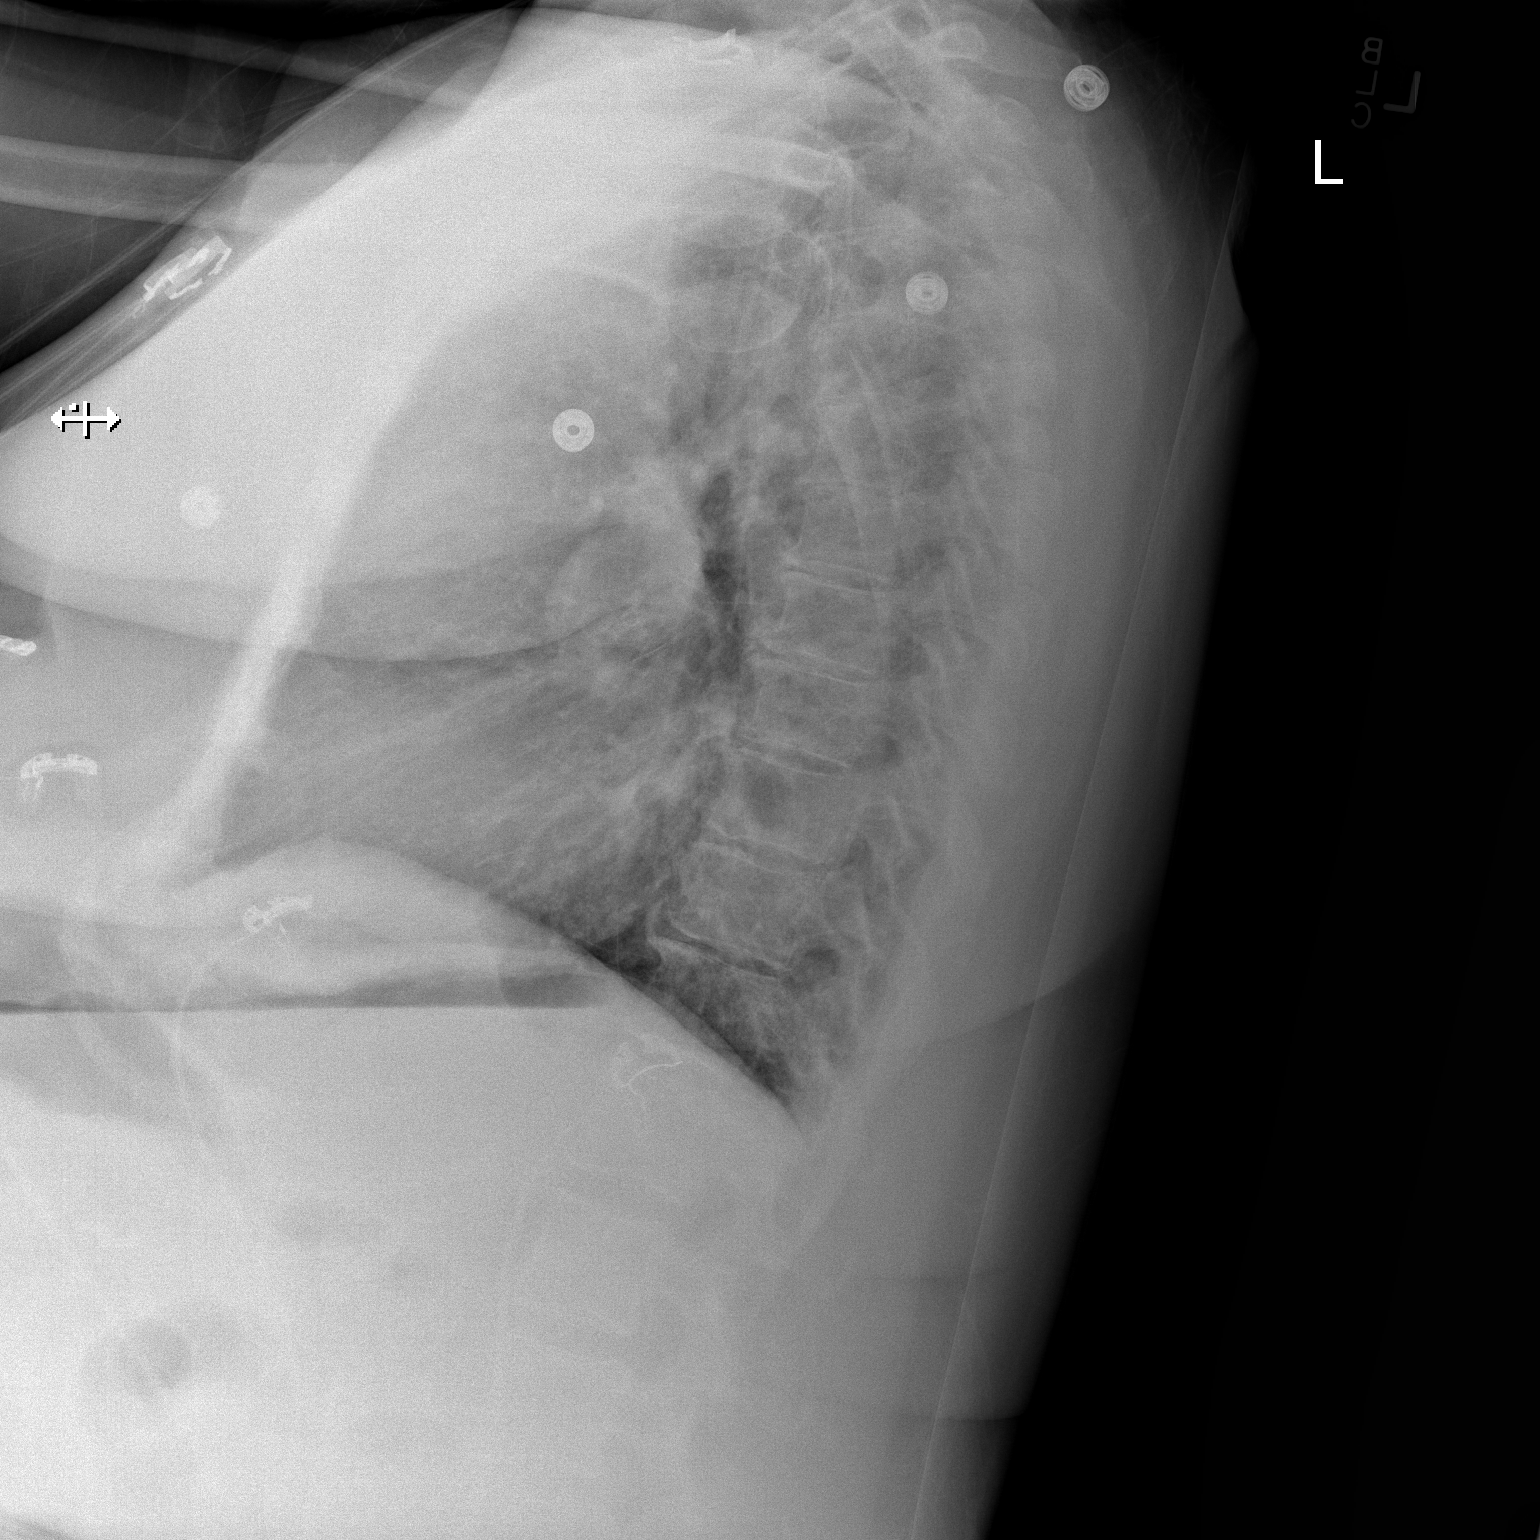

[x chest ap]
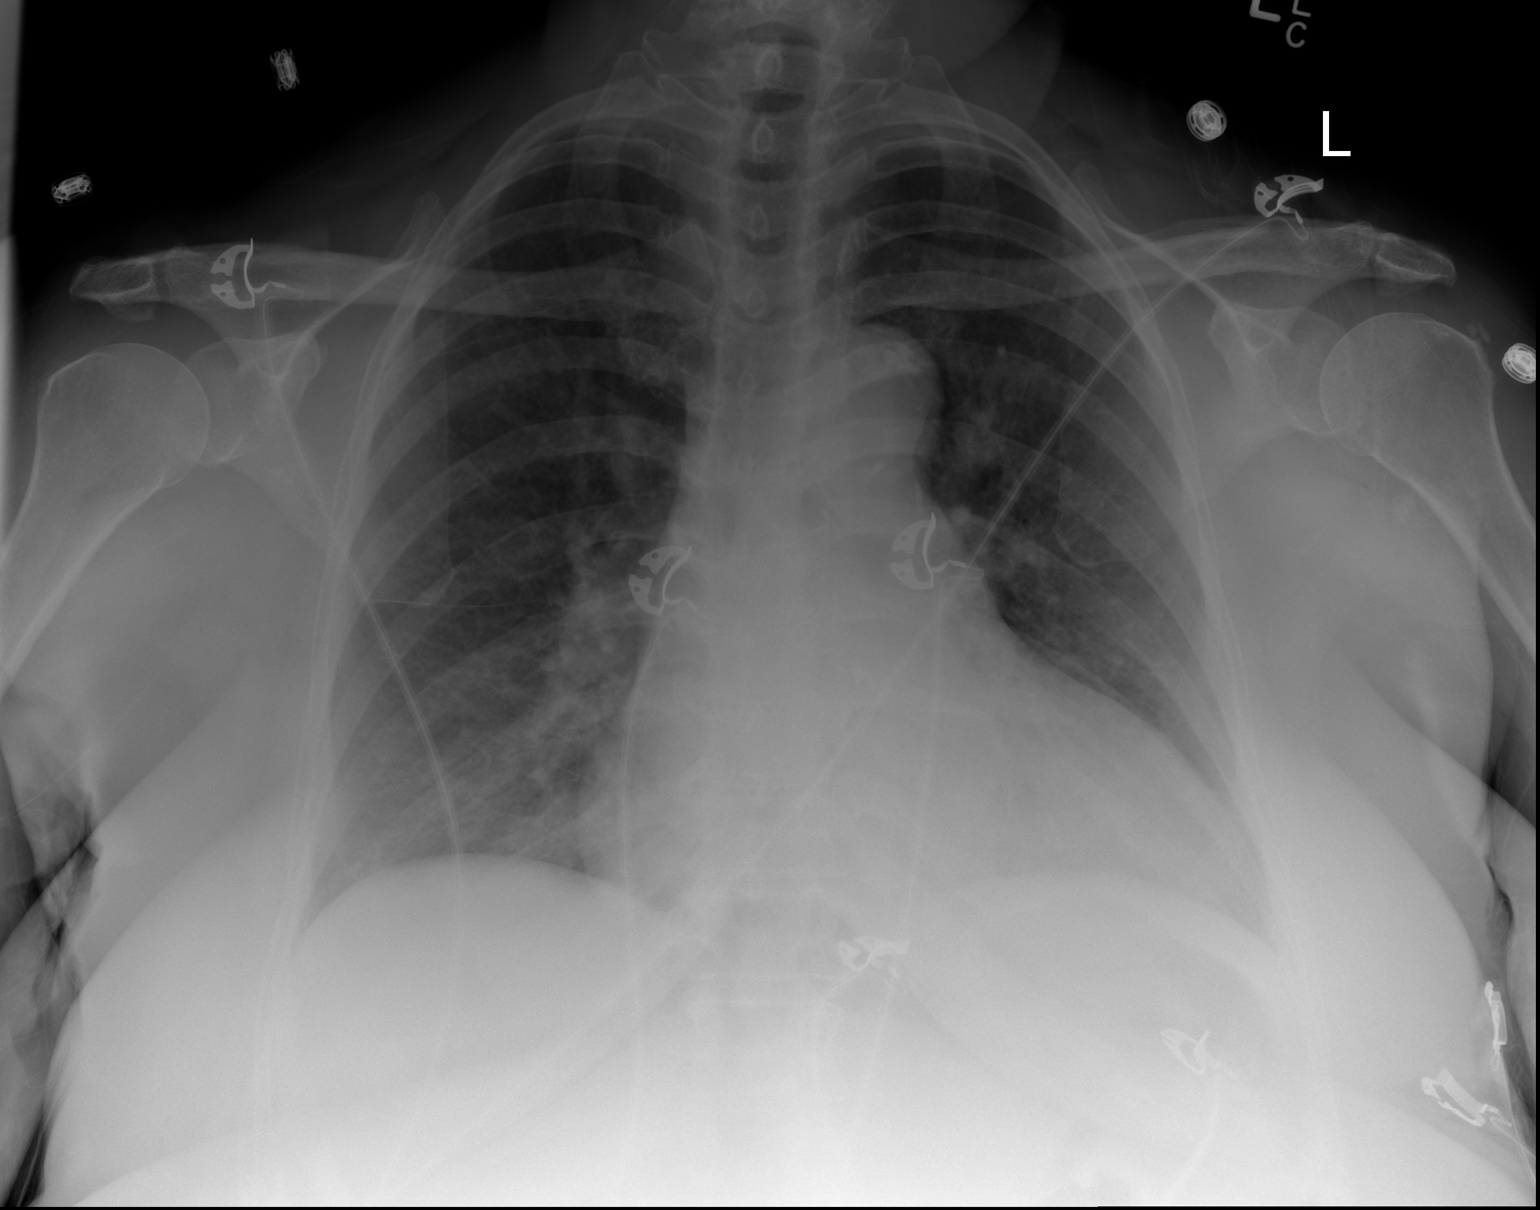

[2 of 2 positions shown; findings below may reference images not displayed]

FINDINGS: The heart is enlarged but stable.  The mediastinal and
hilar contours are unchanged.  There is vascular congestion and
probable mild interstitial pulmonary edema.  No pleural effusion or
focal infiltrate.  The bony thorax is intact.
IMPRESSION: Cardiac enlargement with vascular congestion and probable mild
interstitial pulmonary edema.

## 2013-12-09 ENCOUNTER — Other Ambulatory Visit: Payer: Self-pay | Admitting: Pulmonary Disease

## 2013-12-22 ENCOUNTER — Other Ambulatory Visit: Payer: Self-pay | Admitting: Physician Assistant

## 2013-12-24 ENCOUNTER — Other Ambulatory Visit: Payer: Self-pay | Admitting: *Deleted

## 2013-12-24 MED ORDER — FUROSEMIDE 40 MG PO TABS: 40.0000 mg | ORAL_TABLET | Freq: Three times a day (TID) | ORAL | Status: DC

## 2013-12-30 ENCOUNTER — Other Ambulatory Visit: Payer: Self-pay | Admitting: Physician Assistant

## 2013-12-31 ENCOUNTER — Other Ambulatory Visit: Payer: Self-pay | Admitting: Pulmonary Disease

## 2013-12-31 MED ORDER — FLUTICASONE PROPIONATE 50 MCG/ACT NA SUSP: NASAL | Status: DC

## 2014-01-25 ENCOUNTER — Telehealth: Payer: Self-pay | Admitting: Family Medicine

## 2014-01-25 MED ORDER — HYDROCODONE-ACETAMINOPHEN 5-325 MG PO TABS: ORAL_TABLET | ORAL | Status: DC

## 2014-01-25 NOTE — Telephone Encounter (Signed)
Pt needs new rx hydrocodone

## 2014-01-25 NOTE — Telephone Encounter (Signed)
Script is ready for pick up and I spoke with pt. 

## 2014-01-25 NOTE — Telephone Encounter (Signed)
done

## 2014-02-02 ENCOUNTER — Encounter: Payer: Self-pay | Admitting: Gastroenterology

## 2014-02-18 ENCOUNTER — Other Ambulatory Visit: Payer: Self-pay | Admitting: Pulmonary Disease

## 2014-02-18 ENCOUNTER — Other Ambulatory Visit: Payer: Self-pay | Admitting: Family Medicine

## 2014-02-19 DIAGNOSIS — K219 Gastro-esophageal reflux disease without esophagitis: Secondary | ICD-10-CM | POA: Diagnosis not present

## 2014-02-19 DIAGNOSIS — R05 Cough: Secondary | ICD-10-CM | POA: Diagnosis not present

## 2014-02-19 DIAGNOSIS — J4540 Moderate persistent asthma, uncomplicated: Secondary | ICD-10-CM | POA: Diagnosis not present

## 2014-02-19 DIAGNOSIS — J309 Allergic rhinitis, unspecified: Secondary | ICD-10-CM | POA: Diagnosis not present

## 2014-02-23 ENCOUNTER — Telehealth: Payer: Self-pay | Admitting: Family Medicine

## 2014-02-23 NOTE — Telephone Encounter (Signed)
[id number] [place] [place] - [address] requesting re-fill on nabumetone (RELAFEN) 750 MG tablet

## 2014-02-24 ENCOUNTER — Telehealth: Payer: Self-pay | Admitting: Family Medicine

## 2014-02-24 MED ORDER — LEVOFLOXACIN 500 MG PO TABS: 500.0000 mg | ORAL_TABLET | Freq: Every day | ORAL | Status: DC

## 2014-02-24 MED ORDER — NABUMETONE 750 MG PO TABS: ORAL_TABLET | ORAL | Status: DC

## 2014-02-24 NOTE — Telephone Encounter (Signed)
Call in #[phone] with 3 rf

## 2014-02-24 NOTE — Telephone Encounter (Signed)
Call in Levaquin 500 mg daily for 10 days. She should come in for an OV if not better in 3-4 days

## 2014-02-24 NOTE — Telephone Encounter (Signed)
I sent script e-scribe and spoke with pt.

## 2014-02-24 NOTE — Telephone Encounter (Signed)
[facility]

## 2014-02-24 NOTE — Telephone Encounter (Signed)
Pt is having same symptoms she had in July 2015 ?bronchitis.Pt would like levaquin [facility] [address] Pt decline ov

## 2014-03-03 ENCOUNTER — Other Ambulatory Visit: Payer: Self-pay | Admitting: Physician Assistant

## 2014-03-08 DIAGNOSIS — D495 Neoplasm of unspecified behavior of other genitourinary organs: Secondary | ICD-10-CM | POA: Diagnosis not present

## 2014-03-08 DIAGNOSIS — N281 Cyst of kidney, acquired: Secondary | ICD-10-CM | POA: Diagnosis not present

## 2014-03-09 ENCOUNTER — Other Ambulatory Visit: Payer: Self-pay | Admitting: Urology

## 2014-03-09 DIAGNOSIS — D49519 Neoplasm of unspecified behavior of unspecified kidney: Secondary | ICD-10-CM

## 2014-03-24 ENCOUNTER — Ambulatory Visit (HOSPITAL_COMMUNITY)
Admission: RE | Admit: 2014-03-24 | Discharge: 2014-03-24 | Disposition: A | Discharge status: Home / Self Care | Payer: Medicare Other | Source: Ambulatory Visit | Attending: Urology | Admitting: Urology

## 2014-03-24 DIAGNOSIS — N289 Disorder of kidney and ureter, unspecified: Secondary | ICD-10-CM | POA: Diagnosis not present

## 2014-03-24 DIAGNOSIS — D495 Neoplasm of unspecified behavior of other genitourinary organs: Secondary | ICD-10-CM | POA: Insufficient documentation

## 2014-03-24 DIAGNOSIS — D49519 Neoplasm of unspecified behavior of unspecified kidney: Secondary | ICD-10-CM

## 2014-03-24 MED ORDER — GADOBENATE DIMEGLUMINE 529 MG/ML IV SOLN
20.0000 mL | Freq: Once | INTRAVENOUS | Status: AC | PRN
  Administered 2014-03-24: 20 mL via INTRAVENOUS

## 2014-03-29 ENCOUNTER — Other Ambulatory Visit: Payer: Self-pay | Admitting: Pulmonary Disease

## 2014-03-29 ENCOUNTER — Other Ambulatory Visit: Payer: Self-pay | Admitting: Family Medicine

## 2014-03-30 ENCOUNTER — Other Ambulatory Visit: Payer: Self-pay | Admitting: Family Medicine

## 2014-04-08 ENCOUNTER — Encounter (HOSPITAL_COMMUNITY): Payer: Self-pay | Admitting: Cardiology

## 2014-04-12 ENCOUNTER — Telehealth: Payer: Self-pay | Admitting: Family Medicine

## 2014-04-12 ENCOUNTER — Other Ambulatory Visit: Payer: Self-pay | Admitting: Pulmonary Disease

## 2014-04-12 NOTE — Telephone Encounter (Signed)
Patient needs a re-fill on HYDROcodone-acetaminophen (NORCO/VICODIN) 5-325 MG per tablet

## 2014-04-13 MED ORDER — HYDROCODONE-ACETAMINOPHEN 5-325 MG PO TABS: ORAL_TABLET | ORAL | Status: DC

## 2014-04-13 NOTE — Telephone Encounter (Signed)
Script is ready for pick up and I spoke with pt. 

## 2014-04-13 NOTE — Telephone Encounter (Signed)
done

## 2014-05-24 ENCOUNTER — Telehealth: Payer: Self-pay | Admitting: Family Medicine

## 2014-05-24 NOTE — Telephone Encounter (Signed)
Pt states she has a cough, sinus issues and cannot get into office at this time for appt. Would like to know if dr fry will call in Levaquin or antibiotic. Pt has been on musinex, w/ no succuss. [facility] [address]

## 2014-05-25 ENCOUNTER — Ambulatory Visit (INDEPENDENT_AMBULATORY_CARE_PROVIDER_SITE_OTHER): Payer: Medicare Other | Admitting: Family Medicine

## 2014-05-25 ENCOUNTER — Encounter: Payer: Self-pay | Admitting: Family Medicine

## 2014-05-25 VITALS — BP 160/103 | HR 75 | Temp 98.2°F | Ht 61.0 in | Wt 222.0 lb

## 2014-05-25 DIAGNOSIS — J0100 Acute maxillary sinusitis, unspecified: Secondary | ICD-10-CM

## 2014-05-25 MED ORDER — HYDROCODONE-ACETAMINOPHEN 5-325 MG PO TABS: 2.0000 | ORAL_TABLET | Freq: Four times a day (QID) | ORAL | Status: DC | PRN

## 2014-05-25 MED ORDER — ALBUTEROL SULFATE (2.5 MG/3ML) 0.083% IN NEBU: 2.5000 mg | INHALATION_SOLUTION | RESPIRATORY_TRACT | Status: DC | PRN

## 2014-05-25 MED ORDER — HYDROCODONE-HOMATROPINE 5-1.5 MG/5ML PO SYRP: 5.0000 mL | ORAL_SOLUTION | ORAL | Status: DC | PRN

## 2014-05-25 MED ORDER — HYDROXYZINE HCL 25 MG PO TABS: ORAL_TABLET | ORAL | Status: DC

## 2014-05-25 MED ORDER — ALBUTEROL SULFATE HFA 108 (90 BASE) MCG/ACT IN AERS: 2.0000 | INHALATION_SPRAY | RESPIRATORY_TRACT | Status: AC | PRN

## 2014-05-25 MED ORDER — LEVOFLOXACIN 500 MG PO TABS: 500.0000 mg | ORAL_TABLET | Freq: Every day | ORAL | Status: AC

## 2014-05-25 NOTE — Telephone Encounter (Signed)
I spoke with pt and she is on the schedule for today to see Dr. Sarajane Jews.

## 2014-05-25 NOTE — Progress Notes (Signed)
Pre visit review using our clinic review tool, if applicable. No additional management support is needed unless otherwise documented below in the visit note.

## 2014-05-25 NOTE — Progress Notes (Signed)
Subjective:    Patient ID: [name], female    DOB: Feb 24, 1941, 74 y.o.   MRN: [id number]  HPI Here for 6 days of sinus pressure, PND, nausea without vomiting, and coughing up yellow sputum. No fever. On Mucinex.    Review of Systems  Constitutional: Negative.   HENT: Positive for congestion, postnasal drip and sinus pressure.   Eyes: Negative.   Respiratory: Positive for cough.        Objective:   Physical Exam  Constitutional: She appears well-developed and well-nourished. No distress.  HENT:  Right Ear: External ear normal.  Left Ear: External ear normal.  Nose: Nose normal.  Mouth/Throat: Oropharynx is clear and moist.  Eyes: Conjunctivae are normal.  Pulmonary/Chest: Effort normal and breath sounds normal. No respiratory distress. She has no wheezes. She has no rales.  Lymphadenopathy:    She has no cervical adenopathy.          Assessment & Plan:  Recheck prn

## 2014-05-26 ENCOUNTER — Other Ambulatory Visit: Payer: Self-pay | Admitting: Family Medicine

## 2014-06-10 ENCOUNTER — Other Ambulatory Visit: Payer: Self-pay

## 2014-06-10 ENCOUNTER — Other Ambulatory Visit: Payer: Self-pay | Admitting: Cardiology

## 2014-06-10 MED ORDER — ALBUTEROL SULFATE (2.5 MG/3ML) 0.083% IN NEBU: 2.5000 mg | INHALATION_SOLUTION | RESPIRATORY_TRACT | Status: DC | PRN

## 2014-06-10 NOTE — Telephone Encounter (Signed)
Received a fax from the pharmacy stating the ICD 10code is need and the rx needs to be resent.  Rx resent to pharmacy.

## 2014-06-15 DIAGNOSIS — M25571 Pain in right ankle and joints of right foot: Secondary | ICD-10-CM | POA: Diagnosis not present

## 2014-06-15 DIAGNOSIS — M545 Low back pain: Secondary | ICD-10-CM | POA: Diagnosis not present

## 2014-06-23 ENCOUNTER — Other Ambulatory Visit: Payer: Self-pay | Admitting: Pulmonary Disease

## 2014-06-24 ENCOUNTER — Telehealth: Payer: Self-pay | Admitting: Physician Assistant

## 2014-06-24 ENCOUNTER — Encounter (HOSPITAL_COMMUNITY): Payer: Self-pay | Admitting: *Deleted

## 2014-06-24 ENCOUNTER — Emergency Department (HOSPITAL_COMMUNITY): Payer: Medicare Other

## 2014-06-24 ENCOUNTER — Emergency Department (HOSPITAL_COMMUNITY)
Admission: EM | Admit: 2014-06-24 | Discharge: 2014-06-24 | Disposition: A | Discharge status: Home / Self Care | Payer: Medicare Other | Attending: Emergency Medicine | Admitting: Emergency Medicine

## 2014-06-24 DIAGNOSIS — R3915 Urgency of urination: Secondary | ICD-10-CM | POA: Insufficient documentation

## 2014-06-24 DIAGNOSIS — Z79899 Other long term (current) drug therapy: Secondary | ICD-10-CM | POA: Diagnosis not present

## 2014-06-24 DIAGNOSIS — Z8709 Personal history of other diseases of the respiratory system: Secondary | ICD-10-CM | POA: Diagnosis not present

## 2014-06-24 DIAGNOSIS — F419 Anxiety disorder, unspecified: Secondary | ICD-10-CM | POA: Diagnosis not present

## 2014-06-24 DIAGNOSIS — R531 Weakness: Secondary | ICD-10-CM | POA: Diagnosis not present

## 2014-06-24 DIAGNOSIS — I517 Cardiomegaly: Secondary | ICD-10-CM | POA: Diagnosis not present

## 2014-06-24 DIAGNOSIS — Z9889 Other specified postprocedural states: Secondary | ICD-10-CM | POA: Diagnosis not present

## 2014-06-24 DIAGNOSIS — E780 Pure hypercholesterolemia: Secondary | ICD-10-CM | POA: Diagnosis not present

## 2014-06-24 DIAGNOSIS — Z880 Allergy status to penicillin: Secondary | ICD-10-CM | POA: Insufficient documentation

## 2014-06-24 DIAGNOSIS — I509 Heart failure, unspecified: Secondary | ICD-10-CM | POA: Insufficient documentation

## 2014-06-24 DIAGNOSIS — Z85528 Personal history of other malignant neoplasm of kidney: Secondary | ICD-10-CM | POA: Insufficient documentation

## 2014-06-24 DIAGNOSIS — M1990 Unspecified osteoarthritis, unspecified site: Secondary | ICD-10-CM | POA: Diagnosis not present

## 2014-06-24 DIAGNOSIS — Z87891 Personal history of nicotine dependence: Secondary | ICD-10-CM | POA: Insufficient documentation

## 2014-06-24 DIAGNOSIS — E559 Vitamin D deficiency, unspecified: Secondary | ICD-10-CM | POA: Diagnosis not present

## 2014-06-24 DIAGNOSIS — R350 Frequency of micturition: Secondary | ICD-10-CM | POA: Diagnosis not present

## 2014-06-24 DIAGNOSIS — Z7952 Long term (current) use of systemic steroids: Secondary | ICD-10-CM | POA: Insufficient documentation

## 2014-06-24 DIAGNOSIS — I2510 Atherosclerotic heart disease of native coronary artery without angina pectoris: Secondary | ICD-10-CM | POA: Insufficient documentation

## 2014-06-24 DIAGNOSIS — Z862 Personal history of diseases of the blood and blood-forming organs and certain disorders involving the immune mechanism: Secondary | ICD-10-CM | POA: Insufficient documentation

## 2014-06-24 DIAGNOSIS — E119 Type 2 diabetes mellitus without complications: Secondary | ICD-10-CM | POA: Diagnosis not present

## 2014-06-24 DIAGNOSIS — I10 Essential (primary) hypertension: Secondary | ICD-10-CM | POA: Diagnosis not present

## 2014-06-24 DIAGNOSIS — Z9861 Coronary angioplasty status: Secondary | ICD-10-CM | POA: Insufficient documentation

## 2014-06-24 DIAGNOSIS — Z791 Long term (current) use of non-steroidal anti-inflammatories (NSAID): Secondary | ICD-10-CM | POA: Insufficient documentation

## 2014-06-24 DIAGNOSIS — R5383 Other fatigue: Secondary | ICD-10-CM | POA: Insufficient documentation

## 2014-06-24 DIAGNOSIS — K219 Gastro-esophageal reflux disease without esophagitis: Secondary | ICD-10-CM | POA: Insufficient documentation

## 2014-06-24 DIAGNOSIS — J984 Other disorders of lung: Secondary | ICD-10-CM | POA: Diagnosis not present

## 2014-06-24 DIAGNOSIS — Z87440 Personal history of urinary (tract) infections: Secondary | ICD-10-CM | POA: Insufficient documentation

## 2014-06-24 DIAGNOSIS — E669 Obesity, unspecified: Secondary | ICD-10-CM | POA: Diagnosis not present

## 2014-06-24 DIAGNOSIS — Z86010 Personal history of colonic polyps: Secondary | ICD-10-CM | POA: Insufficient documentation

## 2014-06-24 DIAGNOSIS — Z7951 Long term (current) use of inhaled steroids: Secondary | ICD-10-CM | POA: Insufficient documentation

## 2014-06-24 LAB — BASIC METABOLIC PANEL
ANION GAP: 8 (ref 5–15)
BUN: 14 mg/dL (ref 6–23)
CHLORIDE: 104 mmol/L (ref 96–112)
CO2: 24 mmol/L (ref 19–32)
CREATININE: 0.49 mg/dL — AB (ref 0.50–1.10)
Calcium: 8.7 mg/dL (ref 8.4–10.5)
GFR calc Af Amer: >90 mL/min (ref >90)
GFR calc non Af Amer: >90 mL/min (ref >90)
GLUCOSE: 125 mg/dL — AB (ref 70–99)
Potassium: 4.1 mmol/L (ref 3.5–5.1)
Sodium: 136 mmol/L (ref 135–145)

## 2014-06-24 LAB — URINALYSIS, ROUTINE W REFLEX MICROSCOPIC
BILIRUBIN URINE: NEGATIVE
Glucose, UA: NEGATIVE mg/dL
Hgb urine dipstick: NEGATIVE
KETONES UR: NEGATIVE mg/dL
NITRITE: NEGATIVE
PH: 5 (ref 5.0–8.0)
Protein, ur: NEGATIVE mg/dL
Specific Gravity, Urine: 1.011 (ref 1.005–1.030)
UROBILINOGEN UA: 0.2 mg/dL (ref 0.0–1.0)

## 2014-06-24 LAB — CBC
HEMATOCRIT: 35.3 % — AB (ref 36.0–46.0)
HEMOGLOBIN: 11.4 g/dL — AB (ref 12.0–15.0)
MCH: 30.2 pg (ref 26.0–34.0)
MCHC: 32.3 g/dL (ref 30.0–36.0)
MCV: 93.4 fL (ref 78.0–100.0)
Platelets: 361 10*3/uL (ref 150–400)
RBC: 3.78 MIL/uL — ABNORMAL LOW (ref 3.87–5.11)
RDW: 14 % (ref 11.5–15.5)
WBC: 9.2 10*3/uL (ref 4.0–10.5)

## 2014-06-24 LAB — BRAIN NATRIURETIC PEPTIDE: B Natriuretic Peptide: 45.8 pg/mL (ref 0.0–100.0)

## 2014-06-24 LAB — I-STAT TROPONIN, ED: TROPONIN I, POC: 0 ng/mL (ref 0.00–0.08)

## 2014-06-24 LAB — URINE MICROSCOPIC-ADD ON

## 2014-06-24 MED ORDER — SODIUM CHLORIDE 0.9 % IV BOLUS (SEPSIS)
250.0000 mL | Freq: Once | INTRAVENOUS | Status: AC
  Administered 2014-06-24: 250 mL via INTRAVENOUS

## 2014-06-24 NOTE — Telephone Encounter (Signed)
Patient with h/o systolic and diastolic HF contacted after hour service complaining of increasing SOB and LE edema. States that she has not been able to sleep since 2AM this morning. She has a followup with Dr. Ron Parker in 2 month however her SOB is worsening.   She states she is on her way to [facility] ED brought in by her son. I told her it is a good idea that she seek medical attention. If patient is felt to be fluid overloaded, ED physician will contact cardiology  Signed, Almyra Deforest PA Pager: [phone]

## 2014-06-24 NOTE — ED Provider Notes (Signed)
CSN: [id number]     Arrival date & time 06/24/14  4098 History   First MD Initiated Contact with Patient 06/24/14 [phone]     Chief Complaint  Patient presents with  . Weakness   HPI  Patient is a 74 year old female with past medical history of congestive heart failure with an EF of 65%, coronary artery disease, hypertension, diabetes, and morbid obesity with a neoplasm on the kidney who presents to the emergency room for evaluation of weakness. Patient states that she woke up this morning at approximately 1:30 AM and was drenched in sweat. Patient states that she got up because she needed to use the restroom but felt like she was almost so weak that she could not walk with her cane. Patient states that she took her fluid pills at approximately midnight and she was trying to not wake up her son and daughter-in-law. She states that she just hasn't felt quite right since she woke up at 1:30. She was unable to go back to sleep. She states that she has been coughing and has been slightly more short of breath. She is unsure when the cough started. She thinks that it's related to her asthma. Patient states that she has been compliant with her medications. She denies any chest pain, numbness, one sided weakness, difficulty with speech.  Past Medical History  Diagnosis Date  . CHF (congestive heart failure)     Diastolic  . Volume overload   . Bronchiectasis   . Hypertension   . CAD (coronary artery disease)     BMS to the LAD, 2002; cath 12/07/11 patent LAD stent and mild nonobstructive disease, EF 65%; Medical management  . Diabetes mellitus   . Obesity   . GERD (gastroesophageal reflux disease)   . Fibromyalgia   . Anxiety   . Allergy, unspecified not elsewhere classified   . Pulmonary nodule     Negative PET in 2010  . Other diseases of lung, not elsewhere classified   . Hypercholesterolemia   . Diverticulosis of colon   . Ulcerative colitis, left sided 2008    Hx of  . Adenomatous colon polyp  02/2006  . Microscopic hematuria   . Neoplasm of kidney   . Stress incontinence, female   . DJD (degenerative joint disease)   . Vitamin D deficiency disease   . Anemia   . Syncope   . UTI (lower urinary tract infection)   . Arthritis   . Pinched vertebral nerve   . Ejection fraction    Past Surgical History  Procedure Laterality Date  . Cholecystectomy    . Hand surgery  01/2006    Right Hand Surgery by Dr. Amedeo Plenty  . Coronary stent placement  2002    BMS to the LAD  . Abdominal hysterectomy    . Cardiac catheterization  11/2011  . Colonoscopy w/ biopsies  10/2012    Multiple  . Esophagogastroduodenoscopy  12/2008  . Left heart catheterization with coronary angiogram N/A 12/07/2011    Procedure: LEFT HEART CATHETERIZATION WITH CORONARY ANGIOGRAM;  Surgeon: Peter M Martinique, MD;  Location: [facility] CATH LAB;  Service: Cardiovascular;  Laterality: N/A;   Family History  Problem Relation Age of Onset  . Pneumonia Father   . Heart attack Father   . Parkinsonism Mother   . Sarcoidosis Brother   . Hypertension Sister   . Diabetes Sister   . Colon cancer Neg Hx   . Breast cancer Daughter    History  Substance Use Topics  .  Smoking status: Former Smoker -- 2.00 packs/day for 15 years    Types: Cigarettes    Quit date: 04/30/1973  . Smokeless tobacco: Never Used     Comment: ex-smoker: smoked for 10-15 years up to 2ppd, quit 1975.  Marland Kitchen Alcohol Use: No   OB History    No data available     Review of Systems  Constitutional: Positive for fatigue. Negative for fever and chills.  Respiratory: Positive for cough and shortness of breath. Negative for chest tightness and wheezing.   Cardiovascular: Positive for leg swelling. Negative for chest pain and palpitations.  Gastrointestinal: Negative for nausea, vomiting, abdominal pain, diarrhea and constipation.  Genitourinary: Positive for urgency and frequency. Negative for dysuria, hematuria, flank pain and difficulty urinating.   Neurological: Positive for weakness. Negative for dizziness, numbness and headaches.  All other systems reviewed and are negative.     Allergies  Doxycycline; Amoxicillin; Aspirin; Azithromycin; Caffeine; Ciprofloxacin; Codeine; Oxycodone-acetaminophen; Sulfamethizole; and Tramadol hcl  Home Medications   Prior to Admission medications   Medication Sig Start Date End Date Taking? Authorizing Provider  albuterol (PROVENTIL) (2.5 MG/3ML) 0.083% nebulizer solution Take 3 mLs (2.5 mg total) by nebulization every 4 (four) hours as needed for wheezing or shortness of breath. 06/10/14  Yes [name], MD  albuterol (VENTOLIN HFA) 108 (90 BASE) MCG/ACT inhaler Inhale 2 puffs into the lungs every 4 (four) hours as needed for wheezing or shortness of breath. 05/25/14  Yes [name], MD  ALPRAZolam Duanne Moron) 0.5 MG tablet Take 1 tablet (0.5 mg total) by mouth 3 (three) times daily as needed for anxiety. 11/03/13  Yes [name], MD  amLODipine (NORVASC) 10 MG tablet Take 1 tablet (10 mg total) by mouth daily. 08/21/13  Yes Carlena Bjornstad, MD  atenolol (TENORMIN) 50 MG tablet Take 1 tablet (50 mg total) by mouth daily. 08/21/13  Yes Carlena Bjornstad, MD  atorvastatin (LIPITOR) 10 MG tablet TAKE 1 TABLET EVERY DAY 06/10/14  Yes Carlena Bjornstad, MD  Cholecalciferol (VITAMIN D-3) 5000 UNITS TABS Take 5,000 Units by mouth daily.     Yes Historical Provider, MD  clopidogrel (PLAVIX) 75 MG tablet Take 1 tablet (75 mg total) by mouth daily. 08/21/13  Yes Carlena Bjornstad, MD  diclofenac sodium (VOLTAREN) 1 % GEL Apply 2 g topically 4 (four) times daily.   Yes Historical Provider, MD  fluticasone (FLONASE) 50 MCG/ACT nasal spray PLACE 2 SPRAYS INTO THE NOSE AT BEDTIME. Patient taking differently: Place 2 sprays into both nostrils at bedtime. PLACE 2 SPRAYS INTO THE NOSE AT BEDTIME. 12/31/13  Yes Noralee Space, MD  furosemide (LASIX) 40 MG tablet Take 1 tablet (40 mg total) by mouth 3 (three) times daily. 12/24/13  Yes  Carlena Bjornstad, MD  glimepiride (AMARYL) 1 MG tablet TAKE 1 TABLET (1 MG TOTAL) BY MOUTH DAILY BEFORE BREAKFAST. 05/20/13  Yes Noralee Space, MD  HYDROcodone-acetaminophen (NORCO/VICODIN) 5-325 MG per tablet Take 2 tablets by mouth every 6 (six) hours as needed for severe pain. 05/25/14  Yes [name], MD  hydrOXYzine (ATARAX/VISTARIL) 25 MG tablet TAKE 1 TABLET BY MOUTH EVERY 4 HOURS AS NEEDED FOR ITCHING 05/25/14  Yes [name], MD  isosorbide mononitrate (IMDUR) 60 MG 24 hr tablet Take 1 tablet (60 mg total) by mouth daily. 08/21/13  Yes Carlena Bjornstad, MD  LIDODERM 5 % APPLY 1 PATCH TO SKIN FOR 12 HOURS THEN REMOVE AND DISCARD PATCH WITHIN 72 HOURS  OR AS DIRECTED 03/10/13  Yes Noralee Space, MD  losartan (COZAAR) 50 MG tablet Take 1 tablet (50 mg total) by mouth daily. 08/21/13  Yes Carlena Bjornstad, MD  Magnesium Oxide 400 (240 MG) MG TABS TAKE 1 TABLET (400 MG TOTAL) BY MOUTH DAILY. 03/29/14  Yes [name], MD  mesalamine (LIALDA) 1.2 G EC tablet Take 2.4 g by mouth 2 (two) times daily.   Yes Historical Provider, MD  metFORMIN (GLUCOPHAGE) 500 MG tablet TAKE 1 TABLET (500 MG TOTAL) BY MOUTH 2 (TWO) TIMES DAILY WITH A MEAL. 02/19/14  Yes [name], MD  mometasone-formoterol (DULERA) 100-5 MCG/ACT AERO Inhale 2 puffs into the lungs 2 (two) times daily.   Yes Historical Provider, MD  montelukast (SINGULAIR) 10 MG tablet TAKE 1 TABLET AT BEDTIME 10/24/13  Yes Burnis Medin, MD  nabumetone (RELAFEN) 750 MG tablet TAKE 1 TABLET BY MOUTH TWICE A DAY WITH A MEAL 02/24/14  Yes [name], MD  Naftifine HCl (NAFTIN) 2 % CREA Apply topically 2 (two) times daily.   Yes Historical Provider, MD  pantoprazole (PROTONIX) 40 MG tablet TAKE 1 TABLET BY MOUTH DAILY AT 12 NOON.   Yes Carlena Bjornstad, MD  potassium chloride SA (KLOR-CON M20) 20 MEQ tablet Take 1 tablet (20 mEq total) by mouth 2 (two) times daily. 08/21/13  Yes Carlena Bjornstad, MD  tiZANidine (ZANAFLEX) 4 MG tablet TAKE 1 CAPSULE (4 MG  TOTAL) BY MOUTH 3 (THREE) TIMES DAILY AS NEEDED FOR MUSCLE SPASMS. 09/28/13  Yes [name], MD  ACCU-CHEK AVIVA PLUS test strip CHECK BLOOD SUGAR ONCE A DAY OR AS DIRECTED    Noralee Space, MD  ACCU-CHEK FASTCLIX LANCETS MISC CHECK BLOOD SUGAR ONCE DAILY DX 250.00 07/20/13   Noralee Space, MD  acetaminophen (TYLENOL) 500 MG tablet Take 500 mg by mouth every 6 (six) hours as needed.    Historical Provider, MD  albuterol (PROVENTIL) (2.5 MG/3ML) 0.083% nebulizer solution TAKE 3 MLS (2.5 MG TOTAL) BY NEBULIZATION 3 (THREE) TIMES DAILY AS NEEDED FOR WHEEZING. Patient not taking: Reported on 06/24/2014 11/11/13   [name], MD  benzonatate (TESSALON) 100 MG capsule Take 1 capsule (100 mg total) by mouth every 6 (six) hours as needed for cough. Patient not taking: Reported on 05/25/2014 10/13/13   Noralee Space, MD  cefUROXime (CEFTIN) 500 MG tablet Take 1 tablet (500 mg total) by mouth 2 (two) times daily. Patient not taking: Reported on 05/25/2014 10/24/13   Burnis Medin, MD  chlorpheniramine-HYDROcodone [facility] PENNKINETIC ER) 10-8 MG/5ML LQCR Take 5 mLs by mouth every 12 (twelve) hours as needed for cough. Patient not taking: Reported on 05/25/2014 11/03/13   [name], MD  Diphenhyd-Hydrocort-Nystatin (FIRST-DUKES MOUTHWASH) SUSP 1 tsp gargle and swallow four times daily as needed Patient not taking: Reported on 05/25/2014 11/03/13   [name], MD  fluticasone (FLONASE) 50 MCG/ACT nasal spray PLACE 2 SPRAYS INTO THE NOSE AT BEDTIME. Patient not taking: Reported on 06/24/2014 06/23/14   Noralee Space, MD  glucose blood (ACCU-CHEK AVIVA) test strip Test blood sugar daily 08/11/13   Noralee Space, MD  HYDROcodone-homatropine (HYDROMET) 5-1.5 MG/5ML syrup Take 5 mLs by mouth every 4 (four) hours as needed. Patient not taking: Reported on 06/24/2014 05/25/14   [name], MD  LIALDA 1.2 G EC tablet TAKE 2 TABS TWICE DAILY Patient not taking: Reported on 06/24/2014    Alfredia Ferguson, PA-C  LIALDA  1.2 G EC tablet TAKE 2 TABS TWICE DAILY Patient not taking: Reported on 05/25/2014 12/22/13   Amy S Esterwood, PA-C  Loperamide-Simethicone (IMODIUM MULTI-SYMPTOM RELIEF) 2-125 MG TABS Take 1 tablet by mouth as needed.    Historical Provider, MD  mesalamine [place]) 4 G enema Place enema rectally at bedtime---Do for two weeks and then as needed Patient not taking: Reported on 05/25/2014 08/17/13   Willia Craze, NP  neomycin-polymyxin-hydrocortisone (CORTISPORIN) otic solution Use 1 drop in affected ear three times daily as needed Patient not taking: Reported on 05/25/2014 02/06/12   Noralee Space, MD  nitroGLYCERIN (NITROSTAT) 0.4 MG SL tablet Place 1 tablet (0.4 mg total) under the tongue every 5 (five) minutes as needed for chest pain. Patient not taking: Reported on 05/25/2014 08/21/13   Carlena Bjornstad, MD  nystatin (MYCOSTATIN) 100000 UNIT/ML suspension GARGLE & SWALLOW 1 TEASPOONFUL 4 TIMES A DAY Patient not taking: Reported on 05/25/2014 03/14/12   Noralee Space, MD  predniSONE (DELTASONE) 20 MG tablet 1 po bid x 3 days, 1 daily x 3 days, 1/2 tablet daily x 3 days, 1/2 every other day until gone Patient not taking: Reported on 05/25/2014 10/13/13   Noralee Space, MD  PROCTOSOL HC 2.5 % rectal cream USE SMALL AMOUNT TO THE RECTAL AREA 3-4 TIMES DAILY. Patient not taking: Reported on 05/25/2014 12/30/13   Amy S Esterwood, PA-C  PROCTOSOL HC 2.5 % rectal cream USE SMALL AMOUNT TO THE RECTAL AREA 3-4 TIMES DAILY. Patient not taking: Reported on 05/25/2014 03/03/14   Amy S Esterwood, PA-C  promethazine (PHENERGAN) 25 MG tablet Take 25 mg by mouth 2 (two) times daily as needed. For nausea 01/02/11   Noralee Space, MD   BP 117/68 mmHg  Pulse 56  Temp(Src) 97.7 F (36.5 C) (Oral)  Resp 20  Ht _0  (1.549 m)  Wt 217 lb (98.431 kg)  BMI 41.02 kg/m2  SpO2 96% Physical Exam  Constitutional: She is oriented to person, place, and time. She appears well-developed and well-nourished. No distress.  HENT:   Head: Normocephalic and atraumatic.  Mouth/Throat: No oropharyngeal exudate.  Mucous membranes are mildly dry with no pallor  Eyes: Conjunctivae and EOM are normal. Pupils are equal, round, and reactive to light. No scleral icterus.  Neck: Normal range of motion. Neck supple. No JVD present. No thyromegaly present.  Cardiovascular: Normal rate, regular rhythm, normal heart sounds and intact distal pulses.  Exam reveals no gallop and no friction rub.   No murmur heard. 2+ pretibial pitting edema bilaterally, no calf tenderness, negative Homans sign  Pulmonary/Chest: Effort normal and breath sounds normal. No respiratory distress. She has no wheezes. She has no rales. She exhibits no tenderness.  Abdominal: Soft. Bowel sounds are normal. She exhibits no distension and no mass. There is no tenderness. There is no rebound and no guarding.  Musculoskeletal: Normal range of motion.  Lymphadenopathy:    She has no cervical adenopathy.  Neurological: She is alert and oriented to person, place, and time. She has normal strength. No cranial nerve deficit or sensory deficit. Coordination normal.  Skin: Skin is warm and dry. She is not diaphoretic.  Psychiatric: She has a normal mood and affect. Her behavior is normal. Judgment and thought content normal.  Nursing note and vitals reviewed.   ED Course  Procedures (including critical care time) Labs Review Labs Reviewed  CBC - Abnormal; Notable for the following:    RBC 3.78 (*)  Hemoglobin 11.4 (*)    HCT 35.3 (*)    All other components within normal limits  BASIC METABOLIC PANEL - Abnormal; Notable for the following:    Glucose, Bld 125 (*)    Creatinine, Ser 0.49 (*)    All other components within normal limits  URINALYSIS, ROUTINE W REFLEX MICROSCOPIC - Abnormal; Notable for the following:    Leukocytes, UA TRACE (*)    All other components within normal limits  URINE MICROSCOPIC-ADD ON - Abnormal; Notable for the following:    Casts  HYALINE CASTS (*)    All other components within normal limits  URINE CULTURE  BRAIN NATRIURETIC PEPTIDE  I-STAT TROPOININ, ED  Randolm Idol, ED    Imaging Review Dg Chest 2 View  06/24/2014   CLINICAL DATA:  Awoke at midnight with diaphoresis and weakness.  EXAM: CHEST - 2 VIEW  COMPARISON:  Chest x-ray 11/19/2013.  FINDINGS: The heart is enlarged. Atherosclerotic calcifications are present at the aortic arch. Increased interstitial markings of both lung bases suggest mild edema. There is no significant effusion. No focal airspace consolidation scratch the of the upper lung fields are clear.  IMPRESSION: 1. Cardiomegaly and mild interstitial prominence at the lung bases bilaterally suggesting early edema and congestive heart failure. 2. Interstitial disease at the bases could represent early infection but edema is favored.   Electronically Signed   By: San Morelle [name]   On: 06/24/2014 07:59     EKG Interpretation   Date/Time:  Thursday June 24 2014 06:52:32 EST Ventricular Rate:  67 PR Interval:  182 QRS Duration: 84 QT Interval:  453 QTC Calculation: 478 R Axis:   52 Text Interpretation:  Sinus rhythm LAE, consider biatrial enlargement  Confirmed by DELOS  MD, DOUGLAS ([id number]) on 06/24/2014 7:00:14 AM      MDM   Final diagnoses:  Weakness  Congestive heart failure, unspecified congestive heart failure chronicity, unspecified congestive heart failure type   Patient is a 74 year old female who presents emergency room for evaluation of weakness. Physical exam reveals an alert nontoxic-appearing female with no focal neurological deficits. Lungs are clear to auscultation. Heart regular rate and rhythm. EKG reveals bilateral atrial enlargement with no specific changes. I-STAT troponins negative. BNP is negative. BMP is unremarkable. CBC reveals stable hemoglobin. Chest x-ray does show mild bilateral pulmonary edema with possible small effusions. This is likely secondary  to heart failure. Patient our day on Lasix 3 times daily. Urinalysis does not show frank infection. We'll send for culture given 0-2 white blood cells and age. Patient given 250 mL of normal saline bolus. Patient states that she does not feel that weak anymore. We'll have her follow-up with Dr. Ron Parker her cardiologist in her primary care doctor for adjustment of her Lasix. Patient is stable for discharge at this time. Patient has been seen by and discussed with Dr. Lacinda Axon who agrees with the above workup and plan. All questions have been answered and both patient and her son agreed with the above plan.  Cherylann Parr, PA-C 06/24/14 1126  Nat Christen, MD 06/25/14 [phone]

## 2014-06-24 NOTE — Discharge Instructions (Signed)
Weakness Weakness is a lack of strength. It may be felt all over the body (generalized) or in one specific part of the body (focal). Some causes of weakness can be serious. You may need further medical evaluation, especially if you are elderly or you have a history of immunosuppression (such as chemotherapy or HIV), kidney disease, heart disease, or diabetes. CAUSES  Weakness can be caused by many different things, including:  Infection.  Physical exhaustion.  Internal bleeding or other blood loss that results in a lack of red blood cells (anemia).  Dehydration. This cause is more common in elderly people.  Side effects or electrolyte abnormalities from medicines, such as pain medicines or sedatives.  Emotional distress, anxiety, or depression.  Circulation problems, especially severe peripheral arterial disease.  Heart disease, such as rapid atrial fibrillation, bradycardia, or heart failure.  Nervous system disorders, such as Guillain-Barr syndrome, multiple sclerosis, or stroke. DIAGNOSIS  To find the cause of your weakness, your caregiver will take your history and perform a physical exam. Lab tests or X-rays may also be ordered, if needed. TREATMENT  Treatment of weakness depends on the cause of your symptoms and can vary greatly. HOME CARE INSTRUCTIONS   Rest as needed.  Eat a well-balanced diet.  Try to get some exercise every day.  Only take over-the-counter or prescription medicines as directed by your caregiver. SEEK MEDICAL CARE IF:   Your weakness seems to be getting worse or spreads to other parts of your body.  You develop new aches or pains. SEEK IMMEDIATE MEDICAL CARE IF:   You cannot perform your normal daily activities, such as getting dressed and feeding yourself.  You cannot walk up and down stairs, or you feel exhausted when you do so.  You have shortness of breath or chest pain.  You have difficulty moving parts of your body.  You have weakness  in only one area of the body or on only one side of the body.  You have a fever.  You have trouble speaking or swallowing.  You cannot control your bladder or bowel movements.  You have black or bloody vomit or stools. MAKE SURE YOU:  Understand these instructions.  Will watch your condition.  Will get help right away if you are not doing well or get worse. Document Released: 04/16/2005 Document Revised: 10/16/2011 Document Reviewed: 06/15/2011 [facility] Patient Information 2015 [place], [place]. This information is not intended to replace advice given to you by your health care provider. Make sure you discuss any questions you have with your health care provider.   Emergency Department Resource Guide 1) Find a Doctor and Pay Out of Pocket Although you won't have to find out who is covered by your insurance plan, it is a good idea to ask around and get recommendations. You will then need to call the office and see if the doctor you have chosen will accept you as a new patient and what types of options they offer for patients who are self-pay. Some doctors offer discounts or will set up payment plans for their patients who do not have insurance, but you will need to ask so you aren't surprised when you get to your appointment.  2) Contact Your Local Health Department Not all health departments have doctors that can see patients for sick visits, but many do, so it is worth a call to see if yours does. If you don't know where your local health department is, you can check in your phone book. The  CDC also has a tool to help you locate your state's health department, and many state websites also have listings of all of their local health departments.  3) Find a [facility] If your illness is not likely to be very severe or complicated, you may want to try a walk in clinic. These are popping up all over the country in pharmacies, drugstores, and shopping centers. They're usually staffed by  nurse practitioners or physician assistants that have been trained to treat common illnesses and complaints. They're usually fairly quick and inexpensive. However, if you have serious medical issues or chronic medical problems, these are probably not your best option.  No Primary Care Doctor: - Call Health Connect at  [phone] - they can help you locate a primary care doctor that  accepts your insurance, provides certain services, etc. - Physician Referral Service- [phone]  Chronic Pain Problems: Organization         Address  Phone   Notes  [facility]  [phone] Patients need to be referred by their primary care doctor.   Medication Assistance: Organization         Address  Phone   Notes  [facility] Medication [facility] [address] [place], [place] [address] [phone] --Must be a resident of [facility] -- Must have NO insurance coverage whatsoever (no Medicaid/ Medicare, etc.) -- The pt. MUST have a primary care doctor that directs their care regularly and follows them in the community   MedAssist  [phone]   [employer]  [phone]    Agencies that provide inexpensive medical care: Organization         Address  Phone   Notes  [place]  [phone]   [facility]    [phone]   [facility],  [address] [phone]   [place] [address] [place] [phone]   Planned Parenthood    [phone]   [facility]    [phone]   [place] [address], [place] Phone:  [phone], Fax:  [phone] Hours of Operation:  9 am - 6 pm, M-F.  Also accepts Medicaid/Medicare and self-pay.  [facility] for [place], [address], [place] Phone: [phone], Fax: [phone]. Hours of Operation:  8:30  am - 5:30 pm, M-F.  Also accepts Medicaid and self-pay.  [facility] [place] [address] [place] Phone: [phone]   [place], [place], [place] [phone], Ext. [phone] Mondays & Thursdays: 7-9 AM.  First 15 patients are seen on a first come, first serve basis.    [place] Providers:  Organization         Address  Phone   Notes  [facility] [address], [place] [phone] Also accepts self-pay patients.  [facility] [address], [place]  [phone]   [place], [address], [place] [phone]   [facility] [address] [place] [phone]   Lucianne Lei [address], [place]   [phone] Only accepts [place] Access [place] patients after they have their name applied to their card.   Self-Pay (no  insurance) in [place]:  Organization         Address  Phone   Notes  Sickle Cell Patients, [facility] [place] [phone]   [facility] Urgent Care [place] [phone]   [facility] [place]  [place], [address], [place] [phone]   [facility]/Dr. Osei-Bonsu  [address] [place] or [address], [place] [phone] Phone number for both [place] and [place] locations is the same.  Urgent Medical and [facility] [address], [place] [phone]   [facility] [address] [place] or [address] [phone]   [facility] [address] [place] [phone], phone; [phone], fax Sees patients 1st and 3rd Saturday of every month.  Must not qualify for public or private insurance (i.e. Medicaid, Medicare, [place] Health  Choice, Veterans' Benefits)  Household income should be no more than 200% of the poverty level The clinic cannot treat you if you are pregnant or think you are pregnant  Sexually transmitted diseases are not treated at the clinic.    Dental Care: Organization         Address  Phone  Notes  [facility] Department of [facility] [phone] Accepts children up to age 69 who are enrolled in [place] or [place]; pregnant women with a Medicaid card; and children who have applied for Medicaid or [place] Health Choice, but were declined, whose parents can pay a reduced fee at time of service.  [facility] Department of [facility]  [address], [place] [phone] Accepts children up to age 40 who are enrolled in [place] or [place]; pregnant women with a Medicaid card; and children who have applied for Medicaid or [place] Health Choice, but were declined, whose parents can pay a reduced fee at time of service.  [place] Adult Dental Access PROGRAM  [place] [phone] Patients are seen by appointment only. Walk-ins are not accepted. [place] will see patients 62 years of age and older. Monday - Tuesday (8am-5pm) Most Wednesdays (8:30-5pm) $30 per visit, cash only  [facility] Adult Dental Access PROGRAM  [address], [facility] [phone] Patients are seen by appointment only. Walk-ins are not accepted. [place] will see patients 50 years of age and older. One Wednesday Evening (Monthly: Volunteer Based).  $30 per visit, cash only  [place]  [phone] for adults; Children under age 38, call Graduate Pediatric Dentistry at [phone]. Children aged 55-14, please call [phone] to request a pediatric application.  Dental services are provided in all areas of dental care including fillings, crowns and bridges, complete  and partial dentures, implants, gum treatment, root canals, and extractions. Preventive care is also provided. Treatment is provided to both adults and children. Patients are selected via a lottery and there is often a waiting list.   [facility] [address], [place]  [phone] [email or web address]   [facility] [address] [place], [place] [phone], Ext. [phone] Second and Fourth Thursday of each month, opens at 6:30 AM; Clinic ends at 9 AM.  Patients are seen on a first-come first-served basis, and a limited number are seen  during each clinic.   [facility]  [address] Hillard Danker [place], [place] [phone]   Eligibility Requirements You must have lived in [place], [place], or [place] counties for at least the last three months.   You cannot be eligible for state or federal sponsored [employer], including [employer], [place], or [employer].   You generally cannot be eligible for healthcare insurance through your employer.    How to apply: Eligibility screenings are held every Tuesday and Wednesday afternoon from 1:00 pm until 4:00 pm. You do not need an appointment for the interview!  [facility] [address], [place], [place]   [place]  [place] Department  [place]  [phone]    Behavioral Health Resources in the Community: Intensive Outpatient Programs Organization         Address  Phone  Notes  [place]. [address], [place], [place] [phone]   [facility] [address], [place], [place]   ADS: [facility] [address] [place], [place]   [place] [address]  [place], [place] or [phone]   Substance Abuse Resources Organization          Address  Phone  Notes  Alcohol and Drug Services  [phone]   [place]  [phone]   The [place]   Chinita Pester  [phone]   Residential & Outpatient Substance Abuse Program  [phone]   Psychological Services Organization         Address  Phone  Notes  [facility] [place]  [place]  [phone]   [place] [address], [place] or [phone]    Mobile Crisis Teams Organization         Address  Phone  Notes  [facility], Mobile Crisis Care Unit  [phone]   [facility]  [address] [place], [place]   [place] [address] [place] [phone]    Self-Help/Support Groups Organization         Address  Phone             Notes  [place]. of [place] - variety of support groups  [place] Call for more information  Narcotics Anonymous (NA), [facility] [address], [employer] [place]  2 meetings at this location   Special educational needs teacher         Address  Phone  Notes  [facility]idential Treatment [place],    [place]  [phone]   [facility]  [address], [place] [phone], [place], [place]   [place], [place] [phone] Admissions: 8am-3pm M-F  Incentives Substance [place] [address]    [place], [place] [phone]   [facility] [address] Jadene Pierini [place], [place]   [facility] [address],  [place], [place]   [facility] - Intensive Outpatient [address] Kristeen [address], [place], [place]   [facility].,  [place], [place] or [phone]   [facility] Services (RTS) [address] [place], [place] Accepts Medicaid  [facility].,  [place] [phone] Substance Abuse/Addiction Treatment   [facility] Organization         Address  Phone  Notes  [facility]  [phone]   Domenic Schwab, PhD [address] Arlis Porta [place], [place]   [phone] or [phone]   [facility] [place], [place] [phone]   [address], [place], [place] [phone] Insurance/Medicaid/sponsorship through [facility] and Families [address], [address] [place]                                    [place], [place] [phone] [place] [address], [place] [phone]    Dr. Adele Schilder  [phone]   [facility] Dept. 1) [address], [place] 2) [place] 3)  [place] [address], [place] [phone]  [phone]   [place] [phone] or [phone] (After Hours)

## 2014-06-24 NOTE — ED Notes (Signed)
MD at bedside. 

## 2014-06-24 NOTE — ED Notes (Signed)
States she was fine when she went to bed last pm woke up around 130 am with profuse sweating and felt weak was going to the bathroom and felt like she was to weak to walk.

## 2014-06-25 LAB — URINE CULTURE: Colony Count: 30000

## 2014-06-26 ENCOUNTER — Other Ambulatory Visit: Payer: Self-pay | Admitting: Family Medicine

## 2014-06-26 ENCOUNTER — Other Ambulatory Visit: Payer: Self-pay | Admitting: Cardiology

## 2014-06-29 ENCOUNTER — Other Ambulatory Visit: Payer: Self-pay | Admitting: *Deleted

## 2014-06-29 MED ORDER — GLUCOSE BLOOD VI STRP: 1.0000 | ORAL_STRIP | Freq: Every day | Status: DC

## 2014-07-06 ENCOUNTER — Telehealth: Payer: Self-pay

## 2014-07-06 NOTE — Telephone Encounter (Signed)
These other medications do NOT work for itching, which is why she takes the hydroxyzine. I suggest she pay cash for this since it is generic and very inexpensive

## 2014-07-06 NOTE — Telephone Encounter (Signed)
Received a fax from pharmacy stating hydroxyzine is not covered by insurance any longer.  Insurance will cover buspar or Xyzal.  Pls advise.

## 2014-07-07 ENCOUNTER — Other Ambulatory Visit: Payer: Self-pay | Admitting: Family Medicine

## 2014-07-07 NOTE — Telephone Encounter (Signed)
I spoke with pt and went over the below information. She will check with pharmacy about price.

## 2014-07-08 NOTE — Telephone Encounter (Signed)
Call in #[phone] with [phone] 

## 2014-07-21 ENCOUNTER — Other Ambulatory Visit: Payer: Self-pay | Admitting: Pulmonary Disease

## 2014-07-21 ENCOUNTER — Other Ambulatory Visit: Payer: Self-pay | Admitting: Cardiology

## 2014-07-27 DIAGNOSIS — M1711 Unilateral primary osteoarthritis, right knee: Secondary | ICD-10-CM | POA: Diagnosis not present

## 2014-08-02 ENCOUNTER — Other Ambulatory Visit: Payer: Self-pay | Admitting: Family Medicine

## 2014-08-02 DIAGNOSIS — K219 Gastro-esophageal reflux disease without esophagitis: Secondary | ICD-10-CM | POA: Diagnosis not present

## 2014-08-02 DIAGNOSIS — J4540 Moderate persistent asthma, uncomplicated: Secondary | ICD-10-CM | POA: Diagnosis not present

## 2014-08-02 DIAGNOSIS — J309 Allergic rhinitis, unspecified: Secondary | ICD-10-CM | POA: Diagnosis not present

## 2014-08-02 DIAGNOSIS — R05 Cough: Secondary | ICD-10-CM | POA: Diagnosis not present

## 2014-08-12 DIAGNOSIS — E119 Type 2 diabetes mellitus without complications: Secondary | ICD-10-CM | POA: Diagnosis not present

## 2014-08-12 LAB — HM DIABETES EYE EXAM

## 2014-08-23 ENCOUNTER — Encounter: Payer: Self-pay | Admitting: Cardiology

## 2014-08-23 ENCOUNTER — Other Ambulatory Visit: Payer: Self-pay | Admitting: Cardiology

## 2014-08-23 ENCOUNTER — Ambulatory Visit (INDEPENDENT_AMBULATORY_CARE_PROVIDER_SITE_OTHER): Payer: Medicare Other | Admitting: Cardiology

## 2014-08-23 VITALS — BP 126/70 | HR 74 | Ht 61.0 in | Wt 218.4 lb

## 2014-08-23 DIAGNOSIS — R0989 Other specified symptoms and signs involving the circulatory and respiratory systems: Secondary | ICD-10-CM | POA: Diagnosis not present

## 2014-08-23 DIAGNOSIS — R9430 Abnormal result of cardiovascular function study, unspecified: Secondary | ICD-10-CM

## 2014-08-23 DIAGNOSIS — I2510 Atherosclerotic heart disease of native coronary artery without angina pectoris: Secondary | ICD-10-CM

## 2014-08-23 DIAGNOSIS — I10 Essential (primary) hypertension: Secondary | ICD-10-CM | POA: Diagnosis not present

## 2014-08-23 MED ORDER — CLOPIDOGREL BISULFATE 75 MG PO TABS: 75.0000 mg | ORAL_TABLET | Freq: Every day | ORAL | Status: DC

## 2014-08-23 NOTE — Assessment & Plan Note (Signed)
Blood pressures controlled. No change in therapy.

## 2014-08-23 NOTE — Patient Instructions (Signed)
**Note De-Identified  Obfuscation** Medication Instructions:  None  Labwork: None  Testing/Procedures: None  Follow-Up: Your physician wants you to follow-up in: 6 months. You will receive a reminder letter in the mail two months in advance. If you don't receive a letter, please call our office to schedule the follow-up appointment.

## 2014-08-23 NOTE — Assessment & Plan Note (Signed)
Coronary disease is stable. Her last catheterization was August, 2013. The proximal LAD stent had mild diffuse irregularities. There was a 70% tiny diagonal. There was mild circumflex disease and mild right coronary artery disease. EF was 65%. Medical therapy is recommended. The patient does not tolerate aspirin. She has been on long-term Plavix. We are working with her today to find a way to be sure that her insurance will cover this.

## 2014-08-23 NOTE — Assessment & Plan Note (Signed)
Systolic left ventricular function is normal. No change in therapy.

## 2014-08-23 NOTE — Progress Notes (Signed)
Cardiology Office Note   Date:  08/23/2014   ID:  [name], DOB 10/27/1940, MRN [id number]  PCP:  [name], MD  Cardiologist:  Dola Argyle, MD   Chief Complaint  Patient presents with  . Appointment    Follow-up coronary artery disease      History of Present Illness: [name] is a 74 y.o. female who presents today to follow-up coronary artery disease. She is actually doing well. She has a long history of not being able to tolerate aspirin. Because of this she remains on Plavix long-term. She tells me today that her insurance says that they will no longer cover her Plavix. We are in the process of looking into this. It is absolutely necessary that she remain on this drug.    Past Medical History  Diagnosis Date  . CHF (congestive heart failure)     Diastolic  . Volume overload   . Bronchiectasis   . Hypertension   . CAD (coronary artery disease)     BMS to the LAD, 2002; cath 12/07/11 patent LAD stent and mild nonobstructive disease, EF 65%; Medical management  . Diabetes mellitus   . Obesity   . GERD (gastroesophageal reflux disease)   . Fibromyalgia   . Anxiety   . Allergy, unspecified not elsewhere classified   . Pulmonary nodule     Negative PET in 2010  . Other diseases of lung, not elsewhere classified   . Hypercholesterolemia   . Diverticulosis of colon   . Ulcerative colitis, left sided 2008    Hx of  . Adenomatous colon polyp 02/2006  . Microscopic hematuria   . Neoplasm of kidney   . Stress incontinence, female   . DJD (degenerative joint disease)   . Vitamin D deficiency disease   . Anemia   . Syncope   . UTI (lower urinary tract infection)   . Arthritis   . Pinched vertebral nerve   . Ejection fraction     Past Surgical History  Procedure Laterality Date  . Cholecystectomy    . Hand surgery  01/2006    Right Hand Surgery by Dr. Amedeo Plenty  . Coronary stent placement  2002    BMS to the LAD  . Abdominal hysterectomy    . Cardiac  catheterization  11/2011  . Colonoscopy w/ biopsies  10/2012    Multiple  . Esophagogastroduodenoscopy  12/2008  . Left heart catheterization with coronary angiogram N/A 12/07/2011    Procedure: LEFT HEART CATHETERIZATION WITH CORONARY ANGIOGRAM;  Surgeon: Peter M Martinique, MD;  Location: [facility] CATH LAB;  Service: Cardiovascular;  Laterality: N/A;    Patient Active Problem List   Diagnosis Date Noted  . Cough 10/24/2013  . CAP (community acquired pneumonia) 10/07/2013  . Feeling grief 08/21/2013  . Ejection fraction   . UC (ulcerative colitis) 08/04/2013  . Diarrhea 08/04/2013  . Back pain, chronic 02/06/2012  . Hyperlipidemia 12/07/2011  . Hematuria 11/27/2011  . Syncope and collapse 11/25/2011  . Ulcerative colitis 11/25/2011  . Chronic diastolic CHF (congestive heart failure) 11/25/2011  . Lower extremity edema 11/25/2011  . Nonischemic cardiomyopathy 04/17/2011  . CAD (coronary artery disease)   . Bronchiectasis   . Hypertension   . PERSONAL HX COLONIC POLYPS 02/08/2010  . VITAMIN D DEFICIENCY 12/03/2009  . BRONCHITIS, ACUTE 02/28/2009  . NAUSEA AND VOMITING 12/06/2008  . DEPRESSION 08/10/2008  . PULMONARY NODULE 07/15/2008  . NEOPLASM, KIDNEY 03/16/2008  . MICROSCOPIC HEMATURIA 03/16/2008  . STRESS INCONTINENCE 03/16/2008  .  COLONIC POLYPS 07/19/2007  . DIABETES MELLITUS 07/19/2007  . OBESITY 07/19/2007  . ANEMIA 07/19/2007  . ASTHMA 07/19/2007  . DIVERTICULOSIS OF COLON 07/19/2007  . ANXIETY 03/19/2007  . GERD 03/19/2007  . DEGENERATIVE JOINT DISEASE 03/19/2007  . FIBROMYALGIA 03/19/2007  . ALLERGY 03/19/2007      Current Outpatient Prescriptions  Medication Sig Dispense Refill  . ACCU-CHEK FASTCLIX LANCETS MISC CHECK BLOOD SUGAR ONCE DAILY DX 250.00 102 each 1  . acetaminophen (TYLENOL) 500 MG tablet Take 500 mg by mouth every 6 (six) hours as needed.    Marland Kitchen albuterol (PROVENTIL) (2.5 MG/3ML) 0.083% nebulizer solution TAKE 3 MLS (2.5 MG TOTAL) BY NEBULIZATION 3  (THREE) TIMES DAILY AS NEEDED FOR WHEEZING. 300 mL 6  . albuterol (PROVENTIL) (2.5 MG/3ML) 0.083% nebulizer solution Take 3 mLs (2.5 mg total) by nebulization every 4 (four) hours as needed for wheezing or shortness of breath. 75 mL 11  . albuterol (VENTOLIN HFA) 108 (90 BASE) MCG/ACT inhaler Inhale 2 puffs into the lungs every 4 (four) hours as needed for wheezing or shortness of breath. 18 each 11  . ALPRAZolam (XANAX) 0.5 MG tablet TAKE 1 TABLET THREE TIMES A DAY AS NEEDED FOR ANXIETY 90 tablet 5  . amLODipine (NORVASC) 10 MG tablet Take 1 tablet (10 mg total) by mouth daily. 90 tablet 3  . atenolol (TENORMIN) 50 MG tablet Take 1 tablet (50 mg total) by mouth daily. 90 tablet 3  . atorvastatin (LIPITOR) 10 MG tablet TAKE 1 TABLET EVERY DAY 90 tablet 3  . benzonatate (TESSALON) 100 MG capsule Take 1 capsule (100 mg total) by mouth every 6 (six) hours as needed for cough. 100 capsule 1  . cefUROXime (CEFTIN) 500 MG tablet Take 1 tablet (500 mg total) by mouth 2 (two) times daily. 14 tablet 0  . chlorpheniramine-HYDROcodone (TUSSIONEX PENNKINETIC ER) 10-8 MG/5ML LQCR Take 5 mLs by mouth every 12 (twelve) hours as needed for cough. 240 mL 0  . Cholecalciferol (VITAMIN D-3) 5000 UNITS TABS Take 5,000 Units by mouth daily.      . clopidogrel (PLAVIX) 75 MG tablet Take 1 tablet (75 mg total) by mouth daily. 90 tablet 3  . diclofenac sodium (VOLTAREN) 1 % GEL Apply 2 g topically 4 (four) times daily.    . Diphenhyd-Hydrocort-Nystatin (FIRST-DUKES MOUTHWASH) SUSP 1 tsp gargle and swallow four times daily as needed 120 mL 5  . fluticasone (FLONASE) 50 MCG/ACT nasal spray PLACE 2 SPRAYS INTO THE NOSE AT BEDTIME. (Patient taking differently: Place 2 sprays into both nostrils at bedtime. PLACE 2 SPRAYS INTO THE NOSE AT BEDTIME.) 16 g 3  . furosemide (LASIX) 40 MG tablet TAKE 1 TABLET (40 MG TOTAL) BY MOUTH 3 (THREE) TIMES DAILY. 270 tablet 1  . glimepiride (AMARYL) 1 MG tablet TAKE 1 TABLET BY MOUTH DAILY  BEFORE BREAKFAST. 90 tablet 0  . glucose blood (ONETOUCH VERIO) test strip 1 each by Other route daily. Use as instructed 100 each 12  . HYDROcodone-acetaminophen (NORCO/VICODIN) 5-325 MG per tablet Take 2 tablets by mouth every 6 (six) hours as needed for severe pain. 120 tablet 0  . hydrOXYzine (ATARAX/VISTARIL) 25 MG tablet TAKE 1 TABLET BY MOUTH EVERY 4 HOURS AS NEEDED FOR ITCHING 50 tablet 5  . isosorbide mononitrate (IMDUR) 60 MG 24 hr tablet Take 1 tablet (60 mg total) by mouth daily. 90 tablet 3  . LIALDA 1.2 G EC tablet TAKE 2 TABS TWICE DAILY 120 tablet 3  . LIDODERM 5 % APPLY 1  PATCH TO SKIN FOR 12 HOURS THEN REMOVE AND DISCARD PATCH WITHIN 72 HOURS OR AS DIRECTED 30 patch 2  . Loperamide-Simethicone (IMODIUM MULTI-SYMPTOM RELIEF) 2-125 MG TABS Take 1 tablet by mouth as needed.    Marland Kitchen losartan (COZAAR) 50 MG tablet Take 1 tablet (50 mg total) by mouth daily. 90 tablet 3  . magnesium oxide (MAG-OX) 400 (241.3 MG) MG tablet TAKE 1 TABLET (400 MG TOTAL) BY MOUTH DAILY. 90 tablet 0  . mesalamine (LIALDA) 1.2 G EC tablet Take 2.4 g by mouth 2 (two) times daily.    . mesalamine (ROWASA) 4 G enema Place enema rectally at bedtime---Do for two weeks and then as needed 60 mL 2  . metFORMIN (GLUCOPHAGE) 500 MG tablet TAKE 1 TABLET (500 MG TOTAL) BY MOUTH 2 (TWO) TIMES DAILY WITH A MEAL. 180 tablet 1  . montelukast (SINGULAIR) 10 MG tablet TAKE 1 TABLET AT BEDTIME 90 tablet 0  . nabumetone (RELAFEN) 750 MG tablet TAKE 1 TABLET BY MOUTH TWICE A DAY WITH A MEAL 180 tablet 3  . Naftifine HCl (NAFTIN) 2 % CREA Apply topically 2 (two) times daily.    Marland Kitchen neomycin-polymyxin-hydrocortisone (CORTISPORIN) otic solution Use 1 drop in affected ear three times daily as needed 10 mL 0  . nitroGLYCERIN (NITROSTAT) 0.4 MG SL tablet Place 1 tablet (0.4 mg total) under the tongue every 5 (five) minutes as needed for chest pain. 90 tablet 1  . nystatin (MYCOSTATIN) 100000 UNIT/ML suspension GARGLE & SWALLOW 1 TEASPOONFUL  4 TIMES A DAY 120 mL 3  . pantoprazole (PROTONIX) 40 MG tablet TAKE 1 TABLET BY MOUTH DAILY AT 12 NOON. 30 tablet 2  . potassium chloride SA (KLOR-CON M20) 20 MEQ tablet Take 1 tablet (20 mEq total) by mouth 2 (two) times daily. 180 tablet 3  . predniSONE (DELTASONE) 20 MG tablet 1 po bid x 3 days, 1 daily x 3 days, 1/2 tablet daily x 3 days, 1/2 every other day until gone 15 tablet 0  . PROCTOSOL HC 2.5 % rectal cream USE SMALL AMOUNT TO THE RECTAL AREA 3-4 TIMES DAILY. 28.35 g 1  . PROCTOSOL HC 2.5 % rectal cream USE SMALL AMOUNT TO THE RECTAL AREA 3-4 TIMES DAILY. 28 g 1  . promethazine (PHENERGAN) 25 MG tablet Take 25 mg by mouth 2 (two) times daily as needed. For nausea    . tiZANidine (ZANAFLEX) 4 MG tablet TAKE 1 CAPSULE (4 MG TOTAL) BY MOUTH 3 (THREE) TIMES DAILY AS NEEDED FOR MUSCLE SPASMS. 90 tablet 1  . SYMBICORT 80-4.5 MCG/ACT inhaler   3   No current facility-administered medications for this visit.    Allergies:   Doxycycline; Amoxicillin; Aspirin; Azithromycin; Caffeine; Ciprofloxacin; Codeine; Oxycodone-acetaminophen; Sulfamethizole; and Tramadol hcl    Social History:  The patient  reports that she quit smoking about 41 years ago. Her smoking use included Cigarettes. She has a 30 pack-year smoking history. She has never used smokeless tobacco. She reports that she does not drink alcohol or use illicit drugs.   Family History:  [name] family history includes Breast cancer in her daughter; Diabetes in her sister; Heart attack in her father; Hypertension in her sister; Parkinsonism in her mother; Pneumonia in her father; Sarcoidosis in her brother. There is no history of Colon cancer.    ROS:  Please see the history of present illness.     Patient denies fever, chills, headache, sweats, rash, change in vision, change in hearing, chest pain, cough, nausea or vomiting,  urinary symptoms. All other systems are reviewed and are negative.   PHYSICAL EXAM: VS:  BP 126/70 mmHg   Pulse 74  Ht _0  (1.549 m)  Wt 218 lb 6.4 oz (99.066 kg)  BMI 41.29 kg/m2  SpO2 97% , Patient is overweight. She is oriented to person time and place. Affect is normal. Head is atraumatic. Sclera and conjunctiva were normal. There is no jugular venous distention. Lungs are clear. Respiratory effort is not labored. Cardiac exam reveals S1 and S2. The abdomen is soft. There is no peripheral edema. There are no musculoskeletal deformities. There are no skin rashes. Neurologic is grossly intact.  EKG:   EKG is not done today.   Recent Labs: 06/24/2014: B Natriuretic Peptide 45.8; BUN 14; Creatinine 0.49*; Hemoglobin 11.4*; Platelets 361; Potassium 4.1; Sodium 136    Lipid Panel    Component Value Date/Time   CHOL 136 08/11/2013 1146   TRIG 72.0 08/11/2013 1146   HDL 57.00 08/11/2013 1146   CHOLHDL 2 08/11/2013 1146   VLDL 14.4 08/11/2013 1146   LDLCALC 65 08/11/2013 1146      Wt Readings from Last 3 Encounters:  08/23/14 218 lb 6.4 oz (99.066 kg)  06/24/14 217 lb (98.431 kg)  05/25/14 222 lb (100.699 kg)      Current medicines are reviewed  The patient understands her medications.     ASSESSMENT AND PLAN:

## 2014-08-30 ENCOUNTER — Other Ambulatory Visit: Payer: Self-pay | Admitting: Cardiology

## 2014-08-30 ENCOUNTER — Telehealth: Payer: Self-pay | Admitting: Gastroenterology

## 2014-08-30 NOTE — Telephone Encounter (Signed)
Called patient with no answer and no voicemail. Will attempt to contact later.

## 2014-08-31 NOTE — Telephone Encounter (Signed)
Patient states her insurance sent a letter stating that they will no longer cover Lialda. The preferred medications listed by her insurance are sulfasalazine or Delzicol. Please advise which medication patient can switch to and at what dose?

## 2014-09-01 NOTE — Telephone Encounter (Signed)
Called patient with no answer and no voicemail.

## 2014-09-01 NOTE — Telephone Encounter (Signed)
Delzicol 800 mg tid

## 2014-09-02 MED ORDER — MESALAMINE 400 MG PO CPDR: 800.0000 mg | DELAYED_RELEASE_CAPSULE | Freq: Three times a day (TID) | ORAL | Status: DC

## 2014-09-02 NOTE — Telephone Encounter (Signed)
Prescription sent to the pharmacy. Pt notified that we sent prescription.

## 2014-09-06 ENCOUNTER — Encounter: Payer: Self-pay | Admitting: Family Medicine

## 2014-09-14 ENCOUNTER — Other Ambulatory Visit: Payer: Self-pay | Admitting: Family Medicine

## 2014-09-14 MED ORDER — HYDROCODONE-ACETAMINOPHEN 5-325 MG PO TABS: 2.0000 | ORAL_TABLET | Freq: Four times a day (QID) | ORAL | Status: DC | PRN

## 2014-09-14 NOTE — Telephone Encounter (Signed)
Pt request refill HYDROcodone-acetaminophen (NORCO/VICODIN) 5-325 MG per tablet

## 2014-09-14 NOTE — Telephone Encounter (Signed)
done

## 2014-09-15 NOTE — Telephone Encounter (Signed)
Called and spoke with pt and pt is aware rx is ready for pick up.

## 2014-09-26 ENCOUNTER — Other Ambulatory Visit: Payer: Self-pay | Admitting: Family Medicine

## 2014-10-04 ENCOUNTER — Other Ambulatory Visit: Payer: Self-pay | Admitting: *Deleted

## 2014-10-04 ENCOUNTER — Telehealth: Payer: Self-pay

## 2014-10-04 MED ORDER — MONTELUKAST SODIUM 10 MG PO TABS: ORAL_TABLET | ORAL | Status: DC

## 2014-10-04 NOTE — Telephone Encounter (Signed)
Refused mammogram. Will make appt with Dr. Sarajane Jews at later date

## 2014-10-04 NOTE — Telephone Encounter (Signed)
x °

## 2014-10-04 NOTE — Telephone Encounter (Signed)
Spoke with pt. She refuses a mammogram due to extreme pain in breast. I recommended she see Dr. Sarajane Jews and maybe have an ultrasound of breast instead of mamm.

## 2014-10-07 ENCOUNTER — Other Ambulatory Visit: Payer: Self-pay | Admitting: Pulmonary Disease

## 2014-10-12 ENCOUNTER — Other Ambulatory Visit: Payer: Self-pay | Admitting: *Deleted

## 2014-10-12 NOTE — Telephone Encounter (Signed)
Hydroxyzine is no longer covered by insurance but they will cover Xyzal.  Okay to refill?  [facility] [place]

## 2014-10-14 NOTE — Telephone Encounter (Signed)
Okay to switch to Xyzal 5 mg daily. Call in one year supply

## 2014-10-15 MED ORDER — LEVOCETIRIZINE DIHYDROCHLORIDE 5 MG PO TABS: 5.0000 mg | ORAL_TABLET | Freq: Every evening | ORAL | Status: DC

## 2014-10-15 NOTE — Telephone Encounter (Signed)
Called and spoke with pt and pt is aware.

## 2014-10-15 NOTE — Addendum Note (Signed)
Addended by: Colleen Can on: 10/15/2014 10:25 AM   Modules accepted: Orders, Medications

## 2014-10-15 NOTE — Telephone Encounter (Signed)
Hydroxyzine changed to Xyzal and updated in chart.

## 2014-11-01 ENCOUNTER — Other Ambulatory Visit: Payer: Self-pay | Admitting: Family Medicine

## 2014-11-02 ENCOUNTER — Other Ambulatory Visit: Payer: Self-pay | Admitting: Cardiology

## 2014-11-02 NOTE — Telephone Encounter (Signed)
Can we refill this? Does pt need any labs?

## 2014-11-27 DIAGNOSIS — M25512 Pain in left shoulder: Secondary | ICD-10-CM | POA: Diagnosis not present

## 2014-11-27 DIAGNOSIS — R0789 Other chest pain: Secondary | ICD-10-CM | POA: Diagnosis not present

## 2014-11-27 DIAGNOSIS — M069 Rheumatoid arthritis, unspecified: Secondary | ICD-10-CM | POA: Diagnosis not present

## 2014-11-29 DIAGNOSIS — M7552 Bursitis of left shoulder: Secondary | ICD-10-CM | POA: Diagnosis not present

## 2014-11-29 DIAGNOSIS — M7551 Bursitis of right shoulder: Secondary | ICD-10-CM | POA: Diagnosis not present

## 2014-11-30 ENCOUNTER — Other Ambulatory Visit: Payer: Self-pay | Admitting: Gastroenterology

## 2014-12-01 ENCOUNTER — Other Ambulatory Visit: Payer: Self-pay | Admitting: Gastroenterology

## 2014-12-03 ENCOUNTER — Telehealth: Payer: Self-pay | Admitting: Nurse Practitioner

## 2014-12-03 ENCOUNTER — Other Ambulatory Visit: Payer: Self-pay | Admitting: Gastroenterology

## 2014-12-03 MED ORDER — MESALAMINE 400 MG PO CPDR: 800.0000 mg | DELAYED_RELEASE_CAPSULE | Freq: Three times a day (TID) | ORAL | Status: DC

## 2014-12-03 NOTE — Telephone Encounter (Signed)
Prescription sent to patient's pharmacy and informed to keep appt for further refills.

## 2014-12-17 ENCOUNTER — Encounter: Payer: Self-pay | Admitting: Nurse Practitioner

## 2014-12-17 ENCOUNTER — Ambulatory Visit (INDEPENDENT_AMBULATORY_CARE_PROVIDER_SITE_OTHER): Payer: Medicare Other | Admitting: Nurse Practitioner

## 2014-12-17 VITALS — BP 128/72 | HR 68 | Ht 61.0 in | Wt 212.5 lb

## 2014-12-17 DIAGNOSIS — K5190 Ulcerative colitis, unspecified, without complications: Secondary | ICD-10-CM

## 2014-12-17 DIAGNOSIS — I2510 Atherosclerotic heart disease of native coronary artery without angina pectoris: Secondary | ICD-10-CM

## 2014-12-17 MED ORDER — HYDROCORTISONE 2.5 % RE CREA: TOPICAL_CREAM | Freq: Three times a day (TID) | RECTAL | Status: DC

## 2014-12-17 MED ORDER — MESALAMINE 400 MG PO CPDR
800.0000 mg | DELAYED_RELEASE_CAPSULE | Freq: Three times a day (TID) | ORAL | Status: DC
Start: 2014-12-17 — End: 2015-02-23

## 2014-12-17 NOTE — Progress Notes (Signed)
     History of Present Illness:   Patient is a 74 year old female known to Dr. Fuller Plan. She has a history of left-sided ulcerative colitis diagnosed in 008 and has been maintained on mesalamine. No active disease on last colonoscopy with biopsy July 2014.  Patient has multiple medical problems not limited to history of congestive heart failure, hypertension, coronary artery disease, diabetes, fibromyalgia, and asthma . She is on multiple medications including Plavix.  [name] is here for a refill on mesalamine. Delzicol was prescribed as 800 mg 3 times a day but patient misunderstood and has been taking only 800 mg twice daily. She has no gastrointestinal complaints. Specifically, no diarrhea, blood in stool, or abdominal pain.   Current Medications, Allergies, Past Medical History, Past Surgical History, Family History and Social History were reviewed in [employer] record.  Physical Exam: General: Pleasant, obese black female in no acute distress Head: Normocephalic and atraumatic Eyes:  sclerae anicteric, conjunctiva pink  Ears: Normal auditory acuity Lungs: Clear throughout to auscultation Heart: Regular rate and rhythm Abdomen: Soft, non distended, non-tender. No masses, no hepatomegaly. Normal bowel sounds Musculoskeletal: Symmetrical with no gross deformities  Extremities: 1+ BLE edema  Neurological: Alert oriented x 4, grossly nonfocal Psychological:  Alert and cooperative. Normal mood and affect  Assessment and Recommendations:  1. Pleasant 74 year old female with left sided ulcerative colitis diagnosed 2008. Patient supposed to be taking Delzicol 800 mg 3 times a day. Due to a misunderstanding she has been taking only twice daily. Nonetheless she feels well. No rectal bleeding or abdominal pain. Will refill Delzicol 869m TID. Patient states she is due for routine blood work in a couple of months at PCPs office. She should return to see Dr. SFuller Plan her  primary gastroenterologist in 6-8 months.  2. History of hemorrhoids. Not examined today. Patient keeps Proctosol on hand. Will refill per her request.

## 2014-12-17 NOTE — Patient Instructions (Signed)
We sent refills for Delzicol and Proctosol  To [facility]. 1. Delzocol take 2 tab 3 times daily. 2. Protosol, place small amount rectally 3 times daily as needed.   Follow up with Dr. Fuller Plan in 6-8 months. Call the office in December for a Jan or Feb appointment.

## 2014-12-19 ENCOUNTER — Encounter: Payer: Self-pay | Admitting: Nurse Practitioner

## 2014-12-20 NOTE — Progress Notes (Signed)
Reviewed and agree with management plan.  Nayelli Inglis T. Timira Bieda, MD FACG 

## 2014-12-24 ENCOUNTER — Other Ambulatory Visit: Payer: Self-pay | Admitting: Family Medicine

## 2014-12-28 ENCOUNTER — Other Ambulatory Visit: Payer: Self-pay | Admitting: Family Medicine

## 2015-01-12 ENCOUNTER — Telehealth: Payer: Self-pay | Admitting: Family Medicine

## 2015-01-12 NOTE — Telephone Encounter (Signed)
Pt request refill of the following: HYDROcodone-acetaminophen (NORCO/VICODIN) 5-325 MG per tablet   Phamacy:

## 2015-01-13 MED ORDER — HYDROCODONE-ACETAMINOPHEN 5-325 MG PO TABS: 2.0000 | ORAL_TABLET | Freq: Four times a day (QID) | ORAL | Status: DC | PRN

## 2015-01-13 NOTE — Telephone Encounter (Signed)
Script is ready for pick up and I spoke with pt. 

## 2015-01-13 NOTE — Telephone Encounter (Signed)
done

## 2015-01-17 ENCOUNTER — Telehealth: Payer: Self-pay | Admitting: Cardiology

## 2015-01-17 NOTE — Telephone Encounter (Signed)
New Message  Pt request to re-est with Dr. Marlou Porch moving forward. Would like the nurse to call back to discuss.

## 2015-01-19 NOTE — Telephone Encounter (Signed)
Per Dr Ron Parker it is ok for the pt to f/u with Dr Marlou Porch. She is due to f/u in October, 2016, please arrange. Thanks.

## 2015-01-25 ENCOUNTER — Other Ambulatory Visit: Payer: Self-pay | Admitting: Family Medicine

## 2015-01-25 NOTE — Telephone Encounter (Signed)
Call in #[phone] with [phone] 

## 2015-02-01 DIAGNOSIS — R05 Cough: Secondary | ICD-10-CM | POA: Diagnosis not present

## 2015-02-01 DIAGNOSIS — J309 Allergic rhinitis, unspecified: Secondary | ICD-10-CM | POA: Diagnosis not present

## 2015-02-01 DIAGNOSIS — K219 Gastro-esophageal reflux disease without esophagitis: Secondary | ICD-10-CM | POA: Diagnosis not present

## 2015-02-01 DIAGNOSIS — J4540 Moderate persistent asthma, uncomplicated: Secondary | ICD-10-CM | POA: Diagnosis not present

## 2015-02-10 DIAGNOSIS — M47816 Spondylosis without myelopathy or radiculopathy, lumbar region: Secondary | ICD-10-CM | POA: Diagnosis not present

## 2015-02-10 DIAGNOSIS — S43492A Other sprain of left shoulder joint, initial encounter: Secondary | ICD-10-CM | POA: Diagnosis not present

## 2015-02-14 ENCOUNTER — Other Ambulatory Visit: Payer: Self-pay | Admitting: Gastroenterology

## 2015-02-14 ENCOUNTER — Other Ambulatory Visit: Payer: Self-pay | Admitting: Cardiology

## 2015-02-15 ENCOUNTER — Other Ambulatory Visit: Payer: Self-pay | Admitting: Gastroenterology

## 2015-02-21 ENCOUNTER — Other Ambulatory Visit: Payer: Self-pay | Admitting: Cardiology

## 2015-02-21 ENCOUNTER — Other Ambulatory Visit: Payer: Self-pay | Admitting: Family Medicine

## 2015-02-22 ENCOUNTER — Telehealth: Payer: Self-pay | Admitting: Gastroenterology

## 2015-02-22 NOTE — Telephone Encounter (Signed)
She is coming in to be seen tomorrow with Amy.   Pt wants something for nausea called in.Marland KitchenMarland KitchenMarland KitchenPlease advise.

## 2015-02-22 NOTE — Telephone Encounter (Signed)
Increase Delzicol to 1.6 g tid, 1 year of refills If symptoms not improved in 2-3 weeks call back

## 2015-02-22 NOTE — Telephone Encounter (Signed)
Pt states she is having issues with her UC. States she is having abdominal pain and nausea.Pt states she is taking her Delzicol but still having issues.  Pt states she is supposed to go out of town Friday. Scheduled pt to see [name] PA tomorrow at 1:30pm. Pt would like something called in for nausea. Please advise.

## 2015-02-22 NOTE — Telephone Encounter (Signed)
Clear liquid diet tonight. Defer any further prescriptions until she sees AE tomorrow.

## 2015-02-23 ENCOUNTER — Encounter: Payer: Self-pay | Admitting: Physician Assistant

## 2015-02-23 ENCOUNTER — Other Ambulatory Visit (INDEPENDENT_AMBULATORY_CARE_PROVIDER_SITE_OTHER): Payer: Medicare Other

## 2015-02-23 ENCOUNTER — Ambulatory Visit (INDEPENDENT_AMBULATORY_CARE_PROVIDER_SITE_OTHER): Payer: Medicare Other | Admitting: Physician Assistant

## 2015-02-23 ENCOUNTER — Other Ambulatory Visit: Payer: Self-pay | Admitting: Family Medicine

## 2015-02-23 VITALS — BP 138/80 | HR 72 | Ht 61.0 in | Wt 222.0 lb

## 2015-02-23 DIAGNOSIS — I2510 Atherosclerotic heart disease of native coronary artery without angina pectoris: Secondary | ICD-10-CM | POA: Diagnosis not present

## 2015-02-23 DIAGNOSIS — K51919 Ulcerative colitis, unspecified with unspecified complications: Secondary | ICD-10-CM

## 2015-02-23 DIAGNOSIS — R197 Diarrhea, unspecified: Secondary | ICD-10-CM

## 2015-02-23 LAB — CBC WITH DIFFERENTIAL/PLATELET
BASOS ABS: 0 10*3/uL (ref 0.0–0.1)
Basophils Relative: 0.3 % (ref 0.0–3.0)
Eosinophils Absolute: 0.6 10*3/uL (ref 0.0–0.7)
Eosinophils Relative: 4.8 % (ref 0.0–5.0)
HCT: 38.4 % (ref 36.0–46.0)
Hemoglobin: 12.4 g/dL (ref 12.0–15.0)
LYMPHS ABS: 1.7 10*3/uL (ref 0.7–4.0)
Lymphocytes Relative: 13.9 % (ref 12.0–46.0)
MCHC: 32.3 g/dL (ref 30.0–36.0)
MCV: 93.6 fl (ref 78.0–100.0)
MONO ABS: 0.9 10*3/uL (ref 0.1–1.0)
MONOS PCT: 7 % (ref 3.0–12.0)
NEUTROS PCT: 74 % (ref 43.0–77.0)
Neutro Abs: 9.2 10*3/uL — ABNORMAL HIGH (ref 1.4–7.7)
PLATELETS: 505 10*3/uL — AB (ref 150.0–400.0)
RBC: 4.11 Mil/uL (ref 3.87–5.11)
RDW: 14.9 % (ref 11.5–15.5)
WBC: 12.5 10*3/uL — ABNORMAL HIGH (ref 4.0–10.5)

## 2015-02-23 LAB — BASIC METABOLIC PANEL
BUN: 16 mg/dL (ref 6–23)
CALCIUM: 9.7 mg/dL (ref 8.4–10.5)
CHLORIDE: 102 meq/L (ref 96–112)
CO2: 30 mEq/L (ref 19–32)
CREATININE: 0.6 mg/dL (ref 0.40–1.20)
GFR: 126.17 mL/min (ref >60.00)
Glucose, Bld: 124 mg/dL — ABNORMAL HIGH (ref 70–99)
Potassium: 3.7 mEq/L (ref 3.5–5.1)
SODIUM: 139 meq/L (ref 135–145)

## 2015-02-23 LAB — SEDIMENTATION RATE: Sed Rate: 28 mm/hr — ABNORMAL HIGH (ref 0–22)

## 2015-02-23 MED ORDER — SACCHAROMYCES BOULARDII 250 MG PO CAPS: 250.0000 mg | ORAL_CAPSULE | Freq: Two times a day (BID) | ORAL | Status: DC

## 2015-02-23 MED ORDER — PREDNISONE 10 MG PO TABS: ORAL_TABLET | ORAL | Status: DC

## 2015-02-23 MED ORDER — PREDNISONE 10 MG PO TABS: 10.0000 mg | ORAL_TABLET | Freq: Every day | ORAL | Status: DC

## 2015-02-23 NOTE — Progress Notes (Signed)
Patient ID: [name], female   DOB: 02-Mar-1941, 74 y.o.   MRN: [id number]   Subjective:    Patient ID: [name], female    DOB: [birth year], 74 y.o.   MRN: [id number]  HPI Laquana is a pleasant 74 year old female known to Dr. Fuller Plan who has left-sided ulcerative colitis initially diagnosed in 2008. She has been maintained on mesalamine therapy. She last had colonoscopy in 2014 and at that time had no active disease. He was last seen in the office in August 2016 for refill on her medication and has been taking Delzicol 800 mg 3 times a day. She comes in today with two-week history of what she feels is a flare of her disease. She has been having rather profuse diarrhea with up to 15 bowel movements per day and also having nocturnal episodes. She has been noticing small amounts of bright red blood mixed in with her stool intermittently. No fever or chills. She has had some vague nausea and had 1 episode of vomiting yesterday. She complains of crampy lower abdominal discomfort. She has not started any new medication supplements etc. no recent antibiotics. She says she's distressed because she is supposed to be gone on a vacation to the beach next week and wants to be well  Review of Systems Pertinent positive and negative review of systems were noted in the above HPI section.  All other review of systems was otherwise negative.  Outpatient Encounter Prescriptions as of 02/23/2015  Medication Sig  . ACCU-CHEK FASTCLIX LANCETS MISC CHECK BLOOD SUGAR ONCE DAILY DX 250.00  . acetaminophen (TYLENOL) 500 MG tablet Take 500 mg by mouth every 6 (six) hours as needed.  Marland Kitchen albuterol (PROVENTIL) (2.5 MG/3ML) 0.083% nebulizer solution Take 3 mLs (2.5 mg total) by nebulization every 4 (four) hours as needed for wheezing or shortness of breath.  Marland Kitchen albuterol (VENTOLIN HFA) 108 (90 BASE) MCG/ACT inhaler Inhale 2 puffs into the lungs every 4 (four) hours as needed for wheezing or shortness of breath.  . ALPRAZolam  (XANAX) 0.5 MG tablet TAKE 1 TABLET BY MOUTH 3 TIMES A DAY  . amLODipine (NORVASC) 10 MG tablet TAKE 1 TABLET BY MOUTH DAILY.  Marland Kitchen atenolol (TENORMIN) 50 MG tablet TAKE 1 TABLET BY MOUTH DAILY.  Marland Kitchen atorvastatin (LIPITOR) 10 MG tablet TAKE 1 TABLET EVERY DAY  . chlorpheniramine-HYDROcodone (TUSSIONEX PENNKINETIC ER) 10-8 MG/5ML LQCR Take 5 mLs by mouth every 12 (twelve) hours as needed for cough.  . Cholecalciferol (VITAMIN D-3) 5000 UNITS TABS Take 5,000 Units by mouth daily.    . clopidogrel (PLAVIX) 75 MG tablet Take 1 tablet (75 mg total) by mouth daily.  . DELZICOL 400 MG CPDR DR capsule TAKE 2 CAPSULES BY MOUTH 3 TIMES DAILY.  Marland Kitchen diclofenac sodium (VOLTAREN) 1 % GEL Apply 2 g topically 4 (four) times daily.  . Diphenhyd-Hydrocort-Nystatin (FIRST-DUKES MOUTHWASH) SUSP 1 tsp gargle and swallow four times daily as needed  . fluticasone (FLONASE) 50 MCG/ACT nasal spray PLACE 2 SPRAYS INTO THE NOSE AT BEDTIME. (Patient taking differently: Place 2 sprays into both nostrils at bedtime. PLACE 2 SPRAYS INTO THE NOSE AT BEDTIME.)  . furosemide (LASIX) 40 MG tablet TAKE 1 TABLET (40 MG TOTAL) BY MOUTH 3 (THREE) TIMES DAILY.  Marland Kitchen glimepiride (AMARYL) 1 MG tablet TAKE 1 TABLET BY MOUTH DAILY BEFORE BREAKFAST.  Marland Kitchen glucose blood (ONETOUCH VERIO) test strip 1 each by Other route daily. Use as instructed  . HYDROcodone-acetaminophen (NORCO/VICODIN) 5-325 MG per tablet Take 2 tablets by mouth every  6 (six) hours as needed for severe pain.  . hydrocortisone (ANUSOL-HC) 25 MG suppository USE FOR A WEEK IN THE MORNING  . hydrocortisone (PROCTOSOL HC) 2.5 % rectal cream Place rectally 3 (three) times daily.  . isosorbide mononitrate (IMDUR) 60 MG 24 hr tablet TAKE 1 TABLET BY MOUTH DAILY.  Marland Kitchen KLOR-CON M20 20 MEQ tablet TAKE 1 TABLET BY MOUTH 2 TIMES DAILY.  Marland Kitchen levocetirizine (XYZAL) 5 MG tablet Take 1 tablet (5 mg total) by mouth every evening.  Marland Kitchen LIDODERM 5 % APPLY 1 PATCH TO SKIN FOR 12 HOURS THEN REMOVE AND DISCARD  PATCH WITHIN 72 HOURS OR AS DIRECTED  . loperamide (IMODIUM) 2 MG capsule Take by mouth as needed for diarrhea or loose stools.  . Loperamide-Simethicone (IMODIUM MULTI-SYMPTOM RELIEF) 2-125 MG TABS Take 1 tablet by mouth as needed.  Marland Kitchen losartan (COZAAR) 50 MG tablet TAKE 1 TABLET BY MOUTH DAILY.  . magnesium oxide (MAG-OX) 400 (241.3 MG) MG tablet TAKE 1 TABLET (400 MG TOTAL) BY MOUTH DAILY.  . mesalamine (ROWASA) 4 G enema Place enema rectally at bedtime---Do for two weeks and then as needed  . metFORMIN (GLUCOPHAGE) 500 MG tablet TAKE 1 TABLET (500 MG TOTAL) BY MOUTH 2 (TWO) TIMES DAILY WITH A MEAL.  . montelukast (SINGULAIR) 10 MG tablet TAKE 1 TABLET AT BEDTIME  . nabumetone (RELAFEN) 750 MG tablet TAKE 1 TABLET BY MOUTH TWICE A DAY WITH A MEAL  . Naftifine HCl (NAFTIN) 2 % CREA Apply topically 2 (two) times daily.  Marland Kitchen neomycin-polymyxin-hydrocortisone (CORTISPORIN) otic solution Use 1 drop in affected ear three times daily as needed  . nitroGLYCERIN (NITROSTAT) 0.4 MG SL tablet Place 1 tablet (0.4 mg total) under the tongue every 5 (five) minutes as needed for chest pain.  Marland Kitchen nystatin (MYCOSTATIN) 100000 UNIT/ML suspension GARGLE & SWALLOW 1 TEASPOONFUL 4 TIMES A DAY  . pantoprazole (PROTONIX) 40 MG tablet TAKE 1 TABLET BY MOUTH DAILY AT 12 NOON.  Marland Kitchen promethazine (PHENERGAN) 25 MG tablet Take 25 mg by mouth 2 (two) times daily as needed. For nausea  . SYMBICORT 80-4.5 MCG/ACT inhaler   . tiZANidine (ZANAFLEX) 4 MG tablet TAKE 1 CAPSULE (4 MG TOTAL) BY MOUTH 3 (THREE) TIMES DAILY AS NEEDED FOR MUSCLE SPASMS.  Marland Kitchen predniSONE (DELTASONE) 10 MG tablet Take 1 tablet (10 mg total) by mouth daily with breakfast.  . predniSONE (DELTASONE) 10 MG tablet Take 2 tablets every morning x 14 days, then 15 mg  (1  1/2 tab daily x 14 days, then 10 mg ( 1 tab)  x 14 days then 5 mg ( 1/2 tab)  daily.  Marland Kitchen saccharomyces boulardii (FLORASTOR) 250 MG capsule Take 1 capsule (250 mg total) by mouth 2 (two) times daily.  .  [DISCONTINUED] albuterol (PROVENTIL) (2.5 MG/3ML) 0.083% nebulizer solution TAKE 3 MLS (2.5 MG TOTAL) BY NEBULIZATION 3 (THREE) TIMES DAILY AS NEEDED FOR WHEEZING.  . [DISCONTINUED] Mesalamine (DELZICOL) 400 MG CPDR DR capsule Take 2 capsules (800 mg total) by mouth 3 (three) times daily.  . [DISCONTINUED] pantoprazole (PROTONIX) 40 MG tablet TAKE 1 TABLET BY MOUTH DAILY AT 12 NOON.   No facility-administered encounter medications on file as of 02/23/2015.   Allergies  Allergen Reactions  . Doxycycline Other (See Comments)    Unsteady gait  . Amoxicillin   . Aspirin Nausea And Vomiting  . Azithromycin     REACTION: pt states "ZPak doesn't work"  . Caffeine Nausea And Vomiting  . Ciprofloxacin Nausea And Vomiting  .  Codeine Nausea And Vomiting  . Sulfamethizole   . Tramadol Hcl     REACTION: pt states "spaced-out"   Patient Active Problem List   Diagnosis Date Noted  . Cough 10/24/2013  . CAP (community acquired pneumonia) 10/07/2013  . Feeling grief 08/21/2013  . Ejection fraction   . UC (ulcerative colitis) ([place]) 08/04/2013  . Diarrhea 08/04/2013  . Back pain, chronic 02/06/2012  . Hyperlipidemia 12/07/2011  . Hematuria 11/27/2011  . Syncope and collapse 11/25/2011  . Ulcerative colitis ([place]) 11/25/2011  . Chronic diastolic CHF (congestive heart failure) ([place]) 11/25/2011  . Lower extremity edema 11/25/2011  . Nonischemic cardiomyopathy ([place]) 04/17/2011  . CAD (coronary artery disease)   . Bronchiectasis ([place])   . Essential hypertension   . PERSONAL HX COLONIC POLYPS 02/08/2010  . VITAMIN D DEFICIENCY 12/03/2009  . BRONCHITIS, ACUTE 02/28/2009  . NAUSEA AND VOMITING 12/06/2008  . DEPRESSION 08/10/2008  . PULMONARY NODULE 07/15/2008  . NEOPLASM, KIDNEY 03/16/2008  . MICROSCOPIC HEMATURIA 03/16/2008  . STRESS INCONTINENCE 03/16/2008  . COLONIC POLYPS 07/19/2007  . DIABETES MELLITUS 07/19/2007  . OBESITY 07/19/2007  . ANEMIA 07/19/2007  . ASTHMA 07/19/2007  .  DIVERTICULOSIS OF COLON 07/19/2007  . ANXIETY 03/19/2007  . GERD 03/19/2007  . DEGENERATIVE JOINT DISEASE 03/19/2007  . FIBROMYALGIA 03/19/2007  . ALLERGY 03/19/2007   Social History   Social History  . Marital Status: Divorced    Spouse Name: N/A  . Number of Children: 3  . Years of Education: N/A   Occupational History  . Retired    Social History Main Topics  . Smoking status: Former Smoker -- 2.00 packs/day for 15 years    Types: Cigarettes    Quit date: 04/30/1973  . Smokeless tobacco: Never Used     Comment: ex-smoker: smoked for 10-15 years up to 2ppd, quit 1975.  Marland Kitchen Alcohol Use: No  . Drug Use: No  . Sexual Activity: No   Other Topics Concern  . Not on file   Social History Narrative    Ms. Batta's family history includes Breast cancer in her daughter; Diabetes in her sister; Heart attack in her father; Hypertension in her sister; Parkinsonism in her mother; Pneumonia in her father; Sarcoidosis in her brother. There is no history of Colon cancer.      Objective:    Filed Vitals:   02/23/15 1346  BP: 138/80  Pulse: 72    Physical Exam  well-developed older African-American female in no acute distress, quite pleasant and appears 138/80 pulse 72 height 5 foot 1 weight 222. HEENT; nontraumatic normocephalic EOMI PERRLA sclera anicteric, Cardiovascular ;regular rate and rhythm with S1-S2 no murmur or gallop, Pulmonary; clear bilaterally, Abdomen ;soft she has some mild tenderness in her lower abdomen there is no guarding or rebound no palpable mass or hepatosplenomegaly bowel sounds are present, Rectal exam not done, Extremities ;no clubbing cyanosis or edema skin warm and dry, Neuropsych; mood and affect appropriate       Assessment & Plan:   #1 74 yo female with left sided ulcerative colitis with 2 week hx of profuse diarrhea, lower abdominal cramping, small volume hematochezia and intermittent nausea  R/O exacerbation of ulcerative colitis, R/O  superimposed cdiff #2 AODM #3 hx CHF  Plan; Continue Delzicol 800 mg TID  CbC, BMET,ESR Stool for cdiff pcr--  Start prednisone 20 mg po qam x 2 weeks then taper by 5 mg weekly Start Florastor BID x one month Soft diet, push fluids Will follow up in  office in 2-3 weeks   Jamica Woodyard Genia Harold PA-C 02/23/2015   Cc: [name], MD

## 2015-02-23 NOTE — Progress Notes (Signed)
Reviewed and agree with management plan.  Pricilla Riffle. Fuller Plan, MD [facility]

## 2015-02-23 NOTE — Telephone Encounter (Signed)
I spoke with pt and she is going to schedule a office visit, she is due for labs. I sent in a 30 day supply of requested medications.

## 2015-02-23 NOTE — Progress Notes (Signed)
Reviewed and agree with management plan.  Kinsley Holderman T. Bodie Abernethy, MD FACG 

## 2015-02-23 NOTE — Patient Instructions (Addendum)
Continue Delzicol 400 mg 3 times daily. Start Prednisone 20 mg Take 2 tablets daily x 14 days Take 1 1/2 tab daily x 14 days Take 1 tab daily x 14 days atake 1/2 tab daily x 14 days  We sent prescriptions for Prednisone and FLorasotr the probiotic.[facility] [place]

## 2015-02-24 ENCOUNTER — Other Ambulatory Visit: Payer: Medicare Other

## 2015-02-24 DIAGNOSIS — K51919 Ulcerative colitis, unspecified with unspecified complications: Secondary | ICD-10-CM

## 2015-02-24 DIAGNOSIS — R197 Diarrhea, unspecified: Secondary | ICD-10-CM

## 2015-02-25 ENCOUNTER — Telehealth: Payer: Self-pay | Admitting: Physician Assistant

## 2015-02-25 LAB — CLOSTRIDIUM DIFFICILE BY PCR: CDIFFPCR: NOT DETECTED

## 2015-02-28 NOTE — Telephone Encounter (Signed)
Have you had an opportunity to review her results?

## 2015-03-01 ENCOUNTER — Other Ambulatory Visit: Payer: Self-pay | Admitting: Family Medicine

## 2015-03-01 ENCOUNTER — Other Ambulatory Visit: Payer: Self-pay

## 2015-03-01 ENCOUNTER — Other Ambulatory Visit: Payer: Self-pay | Admitting: Cardiology

## 2015-03-01 MED ORDER — ONDANSETRON HCL 4 MG PO TABS: 4.0000 mg | ORAL_TABLET | Freq: Four times a day (QID) | ORAL | Status: DC | PRN

## 2015-03-01 NOTE — Telephone Encounter (Signed)
Patient is pleased with her results. She is having some nausea. Per Amy Esterwood, Zofran 4 mg PO every 6 to 8 hours if needed for nausea [facility].

## 2015-03-01 NOTE — Telephone Encounter (Signed)
Yes, tell her sorry for delay as I was out of office yesterday- labs are normal and stool for cdiff negative. She should continue Prednisone as we discussed at office visit , and needs office follow up in 2-3 weeks- hope she is feeling better

## 2015-03-02 ENCOUNTER — Other Ambulatory Visit: Payer: Self-pay | Admitting: Cardiology

## 2015-03-02 MED ORDER — ATORVASTATIN CALCIUM 10 MG PO TABS: 10.0000 mg | ORAL_TABLET | Freq: Every day | ORAL | Status: DC

## 2015-03-02 MED ORDER — AMLODIPINE BESYLATE 10 MG PO TABS: 10.0000 mg | ORAL_TABLET | Freq: Every day | ORAL | Status: DC

## 2015-03-03 ENCOUNTER — Ambulatory Visit: Payer: Self-pay | Admitting: Physician Assistant

## 2015-03-15 ENCOUNTER — Ambulatory Visit (INDEPENDENT_AMBULATORY_CARE_PROVIDER_SITE_OTHER): Payer: Medicare Other | Admitting: Family Medicine

## 2015-03-15 ENCOUNTER — Encounter: Payer: Self-pay | Admitting: Family Medicine

## 2015-03-15 VITALS — BP 104/64 | Temp 98.7°F | Ht 61.0 in | Wt 215.0 lb

## 2015-03-15 DIAGNOSIS — E119 Type 2 diabetes mellitus without complications: Secondary | ICD-10-CM

## 2015-03-15 DIAGNOSIS — J4520 Mild intermittent asthma, uncomplicated: Secondary | ICD-10-CM

## 2015-03-15 DIAGNOSIS — F32A Depression, unspecified: Secondary | ICD-10-CM

## 2015-03-15 DIAGNOSIS — I2510 Atherosclerotic heart disease of native coronary artery without angina pectoris: Secondary | ICD-10-CM | POA: Diagnosis not present

## 2015-03-15 DIAGNOSIS — I10 Essential (primary) hypertension: Secondary | ICD-10-CM

## 2015-03-15 DIAGNOSIS — Z23 Encounter for immunization: Secondary | ICD-10-CM

## 2015-03-15 DIAGNOSIS — F329 Major depressive disorder, single episode, unspecified: Secondary | ICD-10-CM | POA: Diagnosis not present

## 2015-03-15 LAB — CBC WITH DIFFERENTIAL/PLATELET
BASOS ABS: 0 10*3/uL (ref 0.0–0.1)
Basophils Relative: 0.2 % (ref 0.0–3.0)
EOS ABS: 0.7 10*3/uL (ref 0.0–0.7)
Eosinophils Relative: 5 % (ref 0.0–5.0)
HEMATOCRIT: 38.5 % (ref 36.0–46.0)
Hemoglobin: 12.4 g/dL (ref 12.0–15.0)
LYMPHS PCT: 13.7 % (ref 12.0–46.0)
Lymphs Abs: 1.9 10*3/uL (ref 0.7–4.0)
MCHC: 32.3 g/dL (ref 30.0–36.0)
MCV: 94.6 fl (ref 78.0–100.0)
Monocytes Absolute: 0.8 10*3/uL (ref 0.1–1.0)
Monocytes Relative: 6 % (ref 3.0–12.0)
NEUTROS ABS: 10.4 10*3/uL — AB (ref 1.4–7.7)
Neutrophils Relative %: 75.1 % (ref 43.0–77.0)
Platelets: 443 10*3/uL — ABNORMAL HIGH (ref 150.0–400.0)
RBC: 4.07 Mil/uL (ref 3.87–5.11)
RDW: 15.2 % (ref 11.5–15.5)
WBC: 13.9 10*3/uL — ABNORMAL HIGH (ref 4.0–10.5)

## 2015-03-15 LAB — HEPATIC FUNCTION PANEL
ALBUMIN: 4.2 g/dL (ref 3.5–5.2)
ALT: 6 U/L (ref 0–35)
AST: 9 U/L (ref 0–37)
Alkaline Phosphatase: 55 U/L (ref 39–117)
Bilirubin, Direct: 0.1 mg/dL (ref 0.0–0.3)
Total Bilirubin: 0.6 mg/dL (ref 0.2–1.2)
Total Protein: 6.9 g/dL (ref 6.0–8.3)

## 2015-03-15 LAB — LIPID PANEL
CHOL/HDL RATIO: 3
Cholesterol: 132 mg/dL (ref 0–200)
HDL: 51.5 mg/dL (ref >39.00)
LDL CALC: 64 mg/dL (ref 0–99)
NonHDL: 80.31
Triglycerides: 84 mg/dL (ref 0.0–149.0)
VLDL: 16.8 mg/dL (ref 0.0–40.0)

## 2015-03-15 LAB — BASIC METABOLIC PANEL
BUN: 10 mg/dL (ref 6–23)
CO2: 32 mEq/L (ref 19–32)
Calcium: 9.9 mg/dL (ref 8.4–10.5)
Chloride: 101 mEq/L (ref 96–112)
Creatinine, Ser: 0.52 mg/dL (ref 0.40–1.20)
GFR: 148.8 mL/min (ref >60.00)
Glucose, Bld: 168 mg/dL — ABNORMAL HIGH (ref 70–99)
POTASSIUM: 4.9 meq/L (ref 3.5–5.1)
SODIUM: 144 meq/L (ref 135–145)

## 2015-03-15 LAB — POCT URINALYSIS DIPSTICK
Bilirubin, UA: NEGATIVE
Blood, UA: NEGATIVE
Glucose, UA: NEGATIVE
KETONES UA: NEGATIVE
NITRITE UA: NEGATIVE
PH UA: 5
PROTEIN UA: NEGATIVE
Spec Grav, UA: 1.02
UROBILINOGEN UA: 0.2

## 2015-03-15 LAB — TSH: TSH: 1.04 u[IU]/mL (ref 0.35–4.50)

## 2015-03-15 LAB — HEMOGLOBIN A1C: HEMOGLOBIN A1C: 7.9 % — AB (ref 4.6–6.5)

## 2015-03-15 NOTE — Progress Notes (Signed)
Pre visit review using our clinic review tool, if applicable. No additional management support is needed unless otherwise documented below in the visit note.

## 2015-03-15 NOTE — Addendum Note (Signed)
Addended by: Aggie Hacker A on: 03/15/2015 10:44 AM   Modules accepted: Orders

## 2015-03-15 NOTE — Progress Notes (Signed)
   Subjective:    Patient ID: [name], female    DOB: 10/23/1940, 74 y.o.   MRN: [id number]  HPI 74 yr old female for a follow up on multiple issues. She feels well except her GI tract has been giving her a lot of problems with cramps, nausea, and diarrhea. She has seen the GI office several times recently. Her last colonoscopy in 2014 showed no active disease with regard to her ulcerative colitis, but her recent symptoms suggest that this may have reactivated. The only food she can tolerate now is starches like baked potatoes or bread. She is due to see Edward Jolly in the GI office again on 03-22-15. Also she is due to see Dr. Jeffie Pollock again on 05-04-15 to follow up a lesion on the left kidney. She had an MRI done of the kidneys on 03-24-14 which revealed a cyst in the right kidney and a "tiny" lesion in the left kidney which was supsicios for a neoplasm. A 12 month follow up scan was planned, and she will be getting this sometime next month. Her BP has been stable. Her glucoses have been running a little high lately in the 150s range while fasting.    Review of Systems  Constitutional: Negative.   HENT: Negative.   Eyes: Negative.   Respiratory: Negative.   Cardiovascular: Negative.   Gastrointestinal: Negative.   Genitourinary: Negative for dysuria, urgency, frequency, hematuria, flank pain, decreased urine volume, enuresis, difficulty urinating, pelvic pain and dyspareunia.  Musculoskeletal: Negative.   Skin: Negative.   Neurological: Negative.   Psychiatric/Behavioral: Negative.        Objective:   Physical Exam  Constitutional: She is oriented to person, place, and time. She appears well-developed and well-nourished. No distress.  HENT:  Head: Normocephalic and atraumatic.  Right Ear: External ear normal.  Left Ear: External ear normal.  Nose: Nose normal.  Mouth/Throat: Oropharynx is clear and moist. No oropharyngeal exudate.  Eyes: Conjunctivae and EOM are normal. Pupils  are equal, round, and reactive to light. No scleral icterus.  Neck: Normal range of motion. Neck supple. No JVD present. No thyromegaly present.  Cardiovascular: Normal rate, regular rhythm, normal heart sounds and intact distal pulses.  Exam reveals no gallop and no friction rub.   No murmur heard. Pulmonary/Chest: Effort normal and breath sounds normal. No respiratory distress. She has no wheezes. She has no rales. She exhibits no tenderness.  Abdominal: Soft. Bowel sounds are normal. She exhibits no distension and no mass. There is no tenderness. There is no rebound and no guarding.  Musculoskeletal: Normal range of motion. She exhibits no edema or tenderness.  Lymphadenopathy:    She has no cervical adenopathy.  Neurological: She is alert and oriented to person, place, and time. She has normal reflexes. No cranial nerve deficit. She exhibits normal muscle tone. Coordination normal.  Skin: Skin is warm and dry. No rash noted. No erythema.  Psychiatric: She has a normal mood and affect. Her behavior is normal. Judgment and thought content normal.          Assessment & Plan:  Her HTN is stable. She will follow up with Cardiology next month. We will get fasting labs today including an A1c to check her diabetes and her renal status. She will get the follow up MRI for the renal lesion as above. She will schedule a mammogram soon. Her depression is stable.

## 2015-03-18 MED ORDER — METFORMIN HCL 1000 MG PO TABS: 1000.0000 mg | ORAL_TABLET | Freq: Two times a day (BID) | ORAL | Status: DC

## 2015-03-18 NOTE — Addendum Note (Signed)
Addended by: Agnes Lawrence on: 03/18/2015 03:36 PM   Modules accepted: Orders, Medications

## 2015-03-22 ENCOUNTER — Ambulatory Visit (INDEPENDENT_AMBULATORY_CARE_PROVIDER_SITE_OTHER): Payer: Medicare Other | Admitting: Physician Assistant

## 2015-03-22 ENCOUNTER — Encounter: Payer: Self-pay | Admitting: Physician Assistant

## 2015-03-22 VITALS — BP 138/80 | HR 80 | Ht 60.0 in | Wt 215.4 lb

## 2015-03-22 DIAGNOSIS — K51211 Ulcerative (chronic) proctitis with rectal bleeding: Secondary | ICD-10-CM

## 2015-03-22 DIAGNOSIS — R197 Diarrhea, unspecified: Secondary | ICD-10-CM | POA: Diagnosis not present

## 2015-03-22 DIAGNOSIS — I2510 Atherosclerotic heart disease of native coronary artery without angina pectoris: Secondary | ICD-10-CM

## 2015-03-22 MED ORDER — MESALAMINE 400 MG PO CPDR: DELAYED_RELEASE_CAPSULE | ORAL | Status: DC

## 2015-03-22 MED ORDER — MESALAMINE 4 G RE ENEM: 4.0000 g | ENEMA | Freq: Two times a day (BID) | RECTAL | Status: DC

## 2015-03-22 MED ORDER — MESALAMINE 1000 MG RE SUPP: 1000.0000 mg | Freq: Every day | RECTAL | Status: DC

## 2015-03-22 MED ORDER — DIPHENOXYLATE-ATROPINE 2.5-0.025 MG PO TABS: ORAL_TABLET | ORAL | Status: DC

## 2015-03-22 MED ORDER — PREDNISONE 10 MG PO TABS: ORAL_TABLET | ORAL | Status: DC

## 2015-03-22 NOTE — Progress Notes (Signed)
Patient ID: [name], female   DOB: [birth year], 74 y.o.   MRN: [id number]   Subjective:    Patient ID: [name], female    DOB: Sep 12, 1940, 74 y.o.   MRN: [id number]  HPI  [name] is a pleasant 74 year old African-American female known to Dr. Fuller [name] with history of left-sided ulcerative colitis. She was initially diagnosed in 2008 and last colonoscopy was done in 2014 at which time she had no active disease. [name] She was seen in the office on 02/23/2015 by myself with complaints of onset of diarrhea cramping and small volume hematochezia over the 2 previous weeks. He was having multiple bowel movements per day. She had been on Delzicol 800 mg by mouth 3 times a day, this was continued and she had stool for C. difficile done which was negative, sedimentation rate mildly elevated at 28 ,CBC and BMET within normal limits. She was started on prednisone 20 mg by mouth daily. She comes back in today for follow-up, stating that she still having problems with diarrhea, mucus in her stools and small amounts of blood. She also continues to have some abdominal cramping after eating and urgency. She also reports a few episodes of nausea and vomiting last week. She has been taking Imodium liquid and tablets without much benefit. On careful questioning she had 3-4 bowel movements yesterday and has had 3 bowel movements thus far today so is clearly better than she was. She is distressed by the urgency and persistence of diarrhea.  Review of Systems Pertinent positive and negative review of systems were noted in the above HPI section.  All other review of systems was otherwise negative.  Outpatient Encounter Prescriptions as of 03/22/2015  Medication Sig  . ACCU-CHEK FASTCLIX LANCETS MISC CHECK BLOOD SUGAR ONCE DAILY DX 250.00  . acetaminophen (TYLENOL) 500 MG tablet Take 500 mg by mouth every 6 (six) hours as needed.  Marland Kitchen albuterol (PROVENTIL) (2.5 MG/3ML) 0.083% nebulizer solution Take 3 mLs (2.5 mg total)  by nebulization every 4 (four) hours as needed for wheezing or shortness of breath.  Marland Kitchen albuterol (VENTOLIN HFA) 108 (90 BASE) MCG/ACT inhaler Inhale 2 puffs into the lungs every 4 (four) hours as needed for wheezing or shortness of breath.  . ALPRAZolam (XANAX) 0.5 MG tablet TAKE 1 TABLET BY MOUTH 3 TIMES A DAY  . amLODipine (NORVASC) 10 MG tablet Take 1 tablet (10 mg total) by mouth daily.  Marland Kitchen atenolol (TENORMIN) 50 MG tablet TAKE 1 TABLET BY MOUTH DAILY.  Marland Kitchen atorvastatin (LIPITOR) 10 MG tablet Take 1 tablet (10 mg total) by mouth daily.  . chlorpheniramine-HYDROcodone (TUSSIONEX PENNKINETIC ER) 10-8 MG/5ML LQCR Take 5 mLs by mouth every 12 (twelve) hours as needed for cough.  . Cholecalciferol (VITAMIN D-3) 5000 UNITS TABS Take 5,000 Units by mouth daily.    . clopidogrel (PLAVIX) 75 MG tablet Take 1 tablet (75 mg total) by mouth daily.  . diclofenac sodium (VOLTAREN) 1 % GEL Apply 2 g topically 4 (four) times daily.  . Diphenhyd-Hydrocort-Nystatin (FIRST-DUKES MOUTHWASH) SUSP 1 tsp gargle and swallow four times daily as needed  . fluticasone (FLONASE) 50 MCG/ACT nasal spray PLACE 2 SPRAYS INTO THE NOSE AT BEDTIME. (Patient taking differently: Place 2 sprays into both nostrils at bedtime. PLACE 2 SPRAYS INTO THE NOSE AT BEDTIME.)  . furosemide (LASIX) 40 MG tablet TAKE 1 TABLET (40 MG TOTAL) BY MOUTH 3 (THREE) TIMES DAILY.  Marland Kitchen glimepiride (AMARYL) 1 MG tablet TAKE 1 TABLET BY MOUTH DAILY BEFORE BREAKFAST.  Marland Kitchen  glucose blood (ONETOUCH VERIO) test strip 1 each by Other route daily. Use as instructed  . HYDROcodone-acetaminophen (NORCO/VICODIN) 5-325 MG per tablet Take 2 tablets by mouth every 6 (six) hours as needed for severe pain.  . hydrocortisone (ANUSOL-HC) 25 MG suppository USE FOR A WEEK IN THE MORNING  . hydrocortisone (PROCTOSOL HC) 2.5 % rectal cream Place rectally 3 (three) times daily.  . isosorbide mononitrate (IMDUR) 60 MG 24 hr tablet TAKE 1 TABLET BY MOUTH DAILY.  Marland Kitchen KLOR-CON M20 20 MEQ  tablet TAKE 1 TABLET BY MOUTH 2 TIMES DAILY.  Marland Kitchen levocetirizine (XYZAL) 5 MG tablet Take 1 tablet (5 mg total) by mouth every evening.  Marland Kitchen LIDODERM 5 % APPLY 1 PATCH TO SKIN FOR 12 HOURS THEN REMOVE AND DISCARD PATCH WITHIN 72 HOURS OR AS DIRECTED  . loperamide (IMODIUM) 2 MG capsule Take by mouth as needed for diarrhea or loose stools.  . Loperamide-Simethicone (IMODIUM MULTI-SYMPTOM RELIEF) 2-125 MG TABS Take 1 tablet by mouth as needed.  Marland Kitchen losartan (COZAAR) 50 MG tablet TAKE 1 TABLET BY MOUTH DAILY.  . magnesium oxide (MAG-OX) 400 (241.3 MG) MG tablet TAKE 1 TABLET (400 MG TOTAL) BY MOUTH DAILY.  Marland Kitchen Mesalamine (DELZICOL) 400 MG CPDR DR capsule Take 2  ( 400 mg ) capsules 3 times daily.  . mesalamine (ROWASA) 4 G enema Place 60 mLs (4 g total) rectally 2 (two) times daily. Place enema rectally at bedtime---Do for two weeks and then as needed  . metFORMIN (GLUCOPHAGE) 1000 MG tablet Take 1 tablet (1,000 mg total) by mouth 2 (two) times daily with a meal.  . montelukast (SINGULAIR) 10 MG tablet TAKE 1 TABLET AT BEDTIME  . nabumetone (RELAFEN) 750 MG tablet TAKE 1 TABLET BY MOUTH TWICE A DAY WITH A MEAL  . Naftifine HCl (NAFTIN) 2 % CREA Apply topically 2 (two) times daily.  Marland Kitchen neomycin-polymyxin-hydrocortisone (CORTISPORIN) otic solution Use 1 drop in affected ear three times daily as needed  . nitroGLYCERIN (NITROSTAT) 0.4 MG SL tablet Place 1 tablet (0.4 mg total) under the tongue every 5 (five) minutes as needed for chest pain.  Marland Kitchen nystatin (MYCOSTATIN) 100000 UNIT/ML suspension GARGLE & SWALLOW 1 TEASPOONFUL 4 TIMES A DAY  . ondansetron (ZOFRAN) 4 MG tablet Take 1 tablet (4 mg total) by mouth every 6 (six) hours as needed for nausea or vomiting.  . pantoprazole (PROTONIX) 40 MG tablet TAKE 1 TABLET BY MOUTH DAILY AT 12 NOON.  Marland Kitchen predniSONE (DELTASONE) 10 MG tablet Take 1 tablet (10 mg total) by mouth daily with breakfast.  . predniSONE (DELTASONE) 10 MG tablet Take 4  ( 10 mg) tablets every morning.   . promethazine (PHENERGAN) 25 MG tablet Take 25 mg by mouth 2 (two) times daily as needed. For nausea  . saccharomyces boulardii (FLORASTOR) 250 MG capsule Take 1 capsule (250 mg total) by mouth 2 (two) times daily.  . SYMBICORT 80-4.5 MCG/ACT inhaler   . tiZANidine (ZANAFLEX) 4 MG tablet TAKE 1 CAPSULE (4 MG TOTAL) BY MOUTH 3 (THREE) TIMES DAILY AS NEEDED FOR MUSCLE SPASMS.  Marland Kitchen tiZANidine (ZANAFLEX) 4 MG tablet TAKE 1 TABLET (4 MG TOTAL) BY MOUTH 3 (THREE) TIMES DAILY AS NEEDED FOR MUSCLE SPASMS.  . [DISCONTINUED] DELZICOL 400 MG CPDR DR capsule TAKE 2 CAPSULES BY MOUTH 3 TIMES DAILY.  . [DISCONTINUED] mesalamine (ROWASA) 4 G enema Place enema rectally at bedtime---Do for two weeks and then as needed  . [DISCONTINUED] predniSONE (DELTASONE) 10 MG tablet Take 2 tablets every morning  x 14 days, then 15 mg  (1  1/2 tab daily x 14 days, then 10 mg ( 1 tab)  x 14 days then 5 mg ( 1/2 tab)  daily.  . diphenoxylate-atropine (LOMOTIL) 2.5-0.025 MG tablet Take 1 tab 3 times daily , take 1 every morning , then as needed.  . mesalamine (CANASA) 1000 MG suppository Place 1 suppository (1,000 mg total) rectally at bedtime.   No facility-administered encounter medications on file as of 03/22/2015.   Allergies  Allergen Reactions  . Doxycycline Other (See Comments)    Unsteady gait  . Amoxicillin   . Aspirin Nausea And Vomiting  . Azithromycin     REACTION: pt states "ZPak doesn't work"  . Caffeine Nausea And Vomiting  . Ciprofloxacin Nausea And Vomiting  . Codeine Nausea And Vomiting  . Sulfamethizole   . Tramadol Hcl     REACTION: pt states "spaced-out"   Patient Active Problem List   Diagnosis Date Noted  . Cough 10/24/2013  . CAP (community acquired pneumonia) 10/07/2013  . Feeling grief 08/21/2013  . Ejection fraction   . UC (ulcerative colitis) ([place]) 08/04/2013  . Diarrhea 08/04/2013  . Back pain, chronic 02/06/2012  . Hyperlipidemia 12/07/2011  . Hematuria 11/27/2011  . Syncope and  collapse 11/25/2011  . Ulcerative colitis ([place]) 11/25/2011  . Chronic diastolic CHF (congestive heart failure) ([place]) 11/25/2011  . Lower extremity edema 11/25/2011  . Nonischemic cardiomyopathy ([place]) 04/17/2011  . CAD (coronary artery disease)   . Bronchiectasis ([place])   . Essential hypertension   . PERSONAL HX COLONIC POLYPS 02/08/2010  . VITAMIN D DEFICIENCY 12/03/2009  . BRONCHITIS, ACUTE 02/28/2009  . NAUSEA AND VOMITING 12/06/2008  . Depression 08/10/2008  . PULMONARY NODULE 07/15/2008  . NEOPLASM, KIDNEY 03/16/2008  . MICROSCOPIC HEMATURIA 03/16/2008  . STRESS INCONTINENCE 03/16/2008  . COLONIC POLYPS 07/19/2007  . Diabetes mellitus without complication ([place]) 65/68/1275  . OBESITY 07/19/2007  . ANEMIA 07/19/2007  . Asthma 07/19/2007  . DIVERTICULOSIS OF COLON 07/19/2007  . ANXIETY 03/19/2007  . GERD 03/19/2007  . DEGENERATIVE JOINT DISEASE 03/19/2007  . FIBROMYALGIA 03/19/2007  . ALLERGY 03/19/2007   Social History   Social History  . Marital Status: Divorced    Spouse Name: N/A  . Number of Children: 3  . Years of Education: N/A   Occupational History  . Retired    Social History Main Topics  . Smoking status: Former Smoker -- 2.00 packs/day for 15 years    Types: Cigarettes    Quit date: 04/30/1973  . Smokeless tobacco: Never Used     Comment: ex-smoker: smoked for 10-15 years up to 2ppd, quit 1975.  Marland Kitchen Alcohol Use: 0.0 oz/week    0 Standard drinks or equivalent per week     Comment: occ  . Drug Use: No  . Sexual Activity: No   Other Topics Concern  . Not on file   Social History Narrative    Ms. Laffoon's family history includes Breast cancer in her daughter; Diabetes in her sister; Heart attack in her father; Hypertension in her sister; Parkinsonism in her mother; Pneumonia in her father; Sarcoidosis in her brother. There is no history of Colon cancer.      Objective:    Filed Vitals:   03/22/15 1342  BP: 138/80  Pulse: 80    Physical Exam   well-developed older African-American female in no acute distress, pleasant blood pressure 138/80 pulse 80 height 5 foot weight 2:15. HEENT ;nontraumatic normocephalic EOMI PERRLA sclera  anicteric, Cardiovascular; regular rate and rhythm with S1-S2 no murmur or gallop, Pulmonary; clear bilaterally, Abdomen; soft, bowel sounds are active she has some tenderness in the left mid and left lower quadrant there is no guarding or rebound no palpable mass or hepatosplenomegaly bowel sounds are present, Rectal ;exam not done,  Ext;no clubbing cyanosis or edema skin warm and dry, Neuropsych ;mood and affect appropriate       Assessment & [name]:   #1 75 yo female with exacerbation of left sided ulcerative colitis- some improvement on prednisone but still symptomatic with diarrhea and urgency #2AODM   [name]; Will increase prednisone to 40 mg po daily x 2 weeks , then taper by 5 mg per week. Continue Delzicol  800 mg by mouth 3 times a day Start Rowasa enemas one by mouth twice a day Canasa suppository , at bedtime which has helped her in the past with tenesmus Add Lomotil 1 by mouth every morning and then when necessary for diarrhea up to 3 daily Will follow up in the office in 2-3 weeks with Dr. Fuller [name] or myself. If not making clear progress will need repeat colonoscopy     Diesel Lina Genia Harold PA-C 03/22/2015   Cc: [name], MD

## 2015-03-22 NOTE — Patient Instructions (Signed)
We sent presdcriptions to [facility] [address], [place]. 1. Prenisone 10 mg tablets. 2. Lomotil tablets. 3. Delzicol 4. Rowasa 5. Canasa  We made you a follow up with [name] PA  On 04-11-2015 2:00 PM.

## 2015-03-23 ENCOUNTER — Telehealth: Payer: Self-pay | Admitting: Physician Assistant

## 2015-03-23 NOTE — Progress Notes (Signed)
Reviewed and agree with management plan.  Malcolm T. Stark, MD FACG 

## 2015-03-28 NOTE — Telephone Encounter (Signed)
Called the patient to apologize for any inconvenience on her prescription for the Rowasa Enemas.  I told her I spoke to the pharmacist at [facility] today and reviewed the dosage with him . I made a mistake with the directions and she is to use 1 enema twice daily . The patient said she was able to pick them up and the pharmacist filled the # 60 with 3 refills.  She thanked me for calling.

## 2015-04-01 ENCOUNTER — Ambulatory Visit (INDEPENDENT_AMBULATORY_CARE_PROVIDER_SITE_OTHER): Payer: Medicare Other | Admitting: Cardiology

## 2015-04-01 ENCOUNTER — Encounter: Payer: Self-pay | Admitting: Cardiology

## 2015-04-01 VITALS — BP 112/70 | HR 73 | Ht 60.0 in | Wt 216.4 lb

## 2015-04-01 DIAGNOSIS — I2510 Atherosclerotic heart disease of native coronary artery without angina pectoris: Secondary | ICD-10-CM | POA: Diagnosis not present

## 2015-04-01 DIAGNOSIS — E669 Obesity, unspecified: Secondary | ICD-10-CM | POA: Diagnosis not present

## 2015-04-01 DIAGNOSIS — I5032 Chronic diastolic (congestive) heart failure: Secondary | ICD-10-CM | POA: Diagnosis not present

## 2015-04-01 DIAGNOSIS — E785 Hyperlipidemia, unspecified: Secondary | ICD-10-CM

## 2015-04-01 DIAGNOSIS — I2583 Coronary atherosclerosis due to lipid rich plaque: Principal | ICD-10-CM

## 2015-04-01 NOTE — Progress Notes (Signed)
Cardiology Office Note   Date:  04/01/2015   ID:  [name], DOB 12/22/1940, MRN [id number]  PCP:  [name], MD  Cardiologist:   Candee Furbish, MD       History of Present Illness: [name] is a 74 y.o. female former patient of Dr. Ron Parker who in 2002 had an BMS Express 3.5 x 20 mm stent placed in the proximal LAD with 3 subsequent catheterization showing patent stent here to establish care.  Physical history of not being able to tolerate aspirin. Stomach pain.  She remains on Plavix long-term.  Has ulcerative colitis. Had trouble at the beach.    Past Medical History  Diagnosis Date  . CHF (congestive heart failure) (HCC)     Diastolic  . Volume overload   . Bronchiectasis   . Hypertension   . CAD (coronary artery disease)     BMS to the LAD, 2002; cath 12/07/11 patent LAD stent and mild nonobstructive disease, EF 65%; Medical management  . Diabetes mellitus   . Obesity   . GERD (gastroesophageal reflux disease)   . Fibromyalgia   . Anxiety   . Allergy, unspecified not elsewhere classified   . Pulmonary nodule     Negative PET in 2010  . Other diseases of lung, not elsewhere classified   . Hypercholesterolemia   . Diverticulosis of colon   . Ulcerative colitis, left sided ([place]) 2008    Hx of  . Adenomatous colon polyp 02/2006  . Microscopic hematuria   . Neoplasm of kidney   . Stress incontinence, female   . DJD (degenerative joint disease)   . Vitamin D deficiency disease   . Anemia   . Syncope   . UTI (lower urinary tract infection)   . Arthritis   . Pinched vertebral nerve   . Ejection fraction     Past Surgical History  Procedure Laterality Date  . Cholecystectomy    . Hand surgery  01/2006    Right Hand Surgery by Dr. Amedeo Plenty  . Coronary stent placement  2002    BMS to the LAD  . Abdominal hysterectomy    . Cardiac catheterization  11/2011  . Colonoscopy w/ biopsies  11-12-12    per Dr. Fuller Plan, diverticulosis only, repeat in 5 yrs  .  Esophagogastroduodenoscopy  12/2008  . Left heart catheterization with coronary angiogram N/A 12/07/2011    Procedure: LEFT HEART CATHETERIZATION WITH CORONARY ANGIOGRAM;  Surgeon: Peter M Martinique, MD;  Location[facility] CATH LAB;  Service: Cardiovascular;  Laterality: N/A;     Current Outpatient Prescriptions  Medication Sig Dispense Refill  . ACCU-CHEK FASTCLIX LANCETS MISC CHECK BLOOD SUGAR ONCE DAILY DX 250.00 102 each 1  . acetaminophen (TYLENOL) 500 MG tablet Take 500 mg by mouth every 6 (six) hours as needed.    Marland Kitchen albuterol (PROVENTIL) (2.5 MG/3ML) 0.083% nebulizer solution Take 3 mLs (2.5 mg total) by nebulization every 4 (four) hours as needed for wheezing or shortness of breath. 75 mL 11  . albuterol (VENTOLIN HFA) 108 (90 BASE) MCG/ACT inhaler Inhale 2 puffs into the lungs every 4 (four) hours as needed for wheezing or shortness of breath. 18 each 11  . ALPRAZolam (XANAX) 0.5 MG tablet TAKE 1 TABLET BY MOUTH 3 TIMES A DAY 90 tablet 5  . amLODipine (NORVASC) 10 MG tablet Take 1 tablet (10 mg total) by mouth daily. 90 tablet 1  . atenolol (TENORMIN) 50 MG tablet TAKE 1 TABLET BY MOUTH DAILY. 90 tablet 1  .  atorvastatin (LIPITOR) 10 MG tablet Take 1 tablet (10 mg total) by mouth daily. 90 tablet 1  . chlorpheniramine-HYDROcodone (TUSSIONEX PENNKINETIC ER) 10-8 MG/5ML LQCR Take 5 mLs by mouth every 12 (twelve) hours as needed for cough. 240 mL 0  . Cholecalciferol (VITAMIN D-3) 5000 UNITS TABS Take 5,000 Units by mouth daily.      . clopidogrel (PLAVIX) 75 MG tablet Take 1 tablet (75 mg total) by mouth daily. 90 tablet 3  . diclofenac sodium (VOLTAREN) 1 % GEL Apply 2 g topically 4 (four) times daily.    . Diphenhyd-Hydrocort-Nystatin (FIRST-DUKES MOUTHWASH) SUSP 1 tsp gargle and swallow four times daily as needed 120 mL 5  . diphenoxylate-atropine (LOMOTIL) 2.5-0.025 MG tablet Take 1 tab 3 times daily , take 1 every morning , then as needed. 90 tablet 0  . fluticasone (FLONASE) 50 MCG/ACT nasal  spray PLACE 2 SPRAYS INTO THE NOSE AT BEDTIME. (Patient taking differently: Place 2 sprays into both nostrils at bedtime. PLACE 2 SPRAYS INTO THE NOSE AT BEDTIME.) 16 g 3  . furosemide (LASIX) 40 MG tablet TAKE 1 TABLET (40 MG TOTAL) BY MOUTH 3 (THREE) TIMES DAILY. 270 tablet 1  . glimepiride (AMARYL) 1 MG tablet TAKE 1 TABLET BY MOUTH DAILY BEFORE BREAKFAST. 90 tablet 3  . glucose blood (ONETOUCH VERIO) test strip 1 each by Other route daily. Use as instructed 100 each 12  . HYDROcodone-acetaminophen (NORCO/VICODIN) 5-325 MG per tablet Take 2 tablets by mouth every 6 (six) hours as needed for severe pain. 120 tablet 0  . hydrocortisone (PROCTOSOL HC) 2.5 % rectal cream Place rectally 3 (three) times daily. 28.35 g 3  . isosorbide mononitrate (IMDUR) 60 MG 24 hr tablet TAKE 1 TABLET BY MOUTH DAILY. 90 tablet 3  . KLOR-CON M20 20 MEQ tablet TAKE 1 TABLET BY MOUTH 2 TIMES DAILY. 180 tablet 0  . levocetirizine (XYZAL) 5 MG tablet Take 1 tablet (5 mg total) by mouth every evening. 90 tablet 3  . LIDODERM 5 % APPLY 1 PATCH TO SKIN FOR 12 HOURS THEN REMOVE AND DISCARD PATCH WITHIN 72 HOURS OR AS DIRECTED 30 patch 2  . loperamide (IMODIUM) 2 MG capsule Take by mouth as needed for diarrhea or loose stools.    . Loperamide-Simethicone (IMODIUM MULTI-SYMPTOM RELIEF) 2-125 MG TABS Take 1 tablet by mouth as needed.    Marland Kitchen losartan (COZAAR) 50 MG tablet TAKE 1 TABLET BY MOUTH DAILY. 90 tablet 0  . magnesium oxide (MAG-OX) 400 (241.3 MG) MG tablet TAKE 1 TABLET (400 MG TOTAL) BY MOUTH DAILY. 30 tablet 0  . mesalamine (CANASA) 1000 MG suppository Place 1 suppository (1,000 mg total) rectally at bedtime. 30 suppository 3  . Mesalamine (DELZICOL) 400 MG CPDR DR capsule Take 2  ( 400 mg ) capsules 3 times daily. 180 capsule 3  . mesalamine (ROWASA) 4 G enema Place 60 mLs (4 g total) rectally 2 (two) times daily. Place enema rectally at bedtime---Do for two weeks and then as needed 60 Bottle 3  . metFORMIN (GLUCOPHAGE)  1000 MG tablet Take 1 tablet (1,000 mg total) by mouth 2 (two) times daily with a meal. 60 tablet 11  . montelukast (SINGULAIR) 10 MG tablet TAKE 1 TABLET AT BEDTIME 90 tablet 6  . nabumetone (RELAFEN) 750 MG tablet TAKE 1 TABLET BY MOUTH TWICE A DAY WITH A MEAL 180 tablet 1  . Naftifine HCl (NAFTIN) 2 % CREA Apply topically 2 (two) times daily.    Marland Kitchen neomycin-polymyxin-hydrocortisone (CORTISPORIN) otic  solution Use 1 drop in affected ear three times daily as needed 10 mL 0  . nitroGLYCERIN (NITROSTAT) 0.4 MG SL tablet Place 1 tablet (0.4 mg total) under the tongue every 5 (five) minutes as needed for chest pain. 90 tablet 1  . nystatin (MYCOSTATIN) 100000 UNIT/ML suspension GARGLE & SWALLOW 1 TEASPOONFUL 4 TIMES A DAY 120 mL 3  . ondansetron (ZOFRAN) 4 MG tablet Take 1 tablet (4 mg total) by mouth every 6 (six) hours as needed for nausea or vomiting. 40 tablet 0  . pantoprazole (PROTONIX) 40 MG tablet TAKE 1 TABLET BY MOUTH DAILY AT 12 NOON. 30 tablet 2  . predniSONE (DELTASONE) 10 MG tablet Take 1 tablet (10 mg total) by mouth daily with breakfast. 100 tablet 0  . promethazine (PHENERGAN) 25 MG tablet Take 25 mg by mouth 2 (two) times daily as needed. For nausea    . saccharomyces boulardii (FLORASTOR) 250 MG capsule Take 1 capsule (250 mg total) by mouth 2 (two) times daily. 60 capsule 0  . SYMBICORT 80-4.5 MCG/ACT inhaler   3  . tiZANidine (ZANAFLEX) 4 MG tablet TAKE 1 CAPSULE (4 MG TOTAL) BY MOUTH 3 (THREE) TIMES DAILY AS NEEDED FOR MUSCLE SPASMS. 90 tablet 1   No current facility-administered medications for this visit.    Allergies:   Doxycycline; Amoxicillin; Aspirin; Azithromycin; Caffeine; Ciprofloxacin; Codeine; Sulfamethizole; and Tramadol hcl    Social History:  The patient  reports that she quit smoking about 41 years ago. Her smoking use included Cigarettes. She has a 30 pack-year smoking history. She has never used smokeless tobacco. She reports that she drinks alcohol. She  reports that she does not use illicit drugs.   Family History:  [name] family history includes Breast cancer in her daughter; Diabetes in her sister; Heart attack in her father; Hypertension in her sister; Parkinsonism in her mother; Pneumonia in her father; Sarcoidosis in her brother. There is no history of Colon cancer.    ROS:  Please see the history of present illness.   Otherwise, review of systems are positive for none.   All other systems are reviewed and negative.    PHYSICAL EXAM: VS:  BP 112/70 mmHg  Pulse 73  Ht 5' (1.524 m)  Wt 216 lb 6 oz (98.147 kg)  BMI 42.26 kg/m2  SpO2 93% , BMI Body mass index is 42.26 kg/(m^2). GEN: Well nourished, well developed, in no acute distress HEENT: normal Neck: no JVD, carotid bruits, or masses Cardiac: RRR; no murmurs, rubs, or gallops,no edema  Respiratory:  clear to auscultation bilaterally, normal work of breathing GI: soft, nontender, nondistended, + BS, overweight MS: no deformity or atrophy Skin: warm and dry, no rash Neuro:  Strength and sensation are intact Psych: euthymic mood, full affect   EKG:  None today  Recent Labs: 06/24/2014: B Natriuretic Peptide 45.8 03/15/2015: ALT 6; BUN 10; Creatinine, Ser 0.52; Hemoglobin 12.4; Platelets 443.0*; Potassium 4.9; Sodium 144; TSH 1.04    Lipid Panel    Component Value Date/Time   CHOL 132 03/15/2015 1014   TRIG 84.0 03/15/2015 1014   HDL 51.50 03/15/2015 1014   CHOLHDL 3 03/15/2015 1014   VLDL 16.8 03/15/2015 1014   LDLCALC 64 03/15/2015 1014      Wt Readings from Last 3 Encounters:  04/01/15 216 lb 6 oz (98.147 kg)  03/22/15 215 lb 6.4 oz (97.705 kg)  03/15/15 215 lb (97.523 kg)      Other studies Reviewed: Additional studies/ records that  were reviewed today include: Prior office notes, lab work, catheterization reports. Review of the above records demonstrates: As above   ASSESSMENT AND PLAN:  Coronary artery disease -2002 proximal LAD bare-metal  stent, express stent, Dr. Maurene Capes. -3 subsequent catheterizations reassuring. Last catheterization 2013. -No symptoms currently. Doing well. Secondary prevention. -Moderate disease treated medically in other vessels. Aspirin intolerance -Stomach issues if taking aspirin -Continue Plavix lifelong.  Obesity -Encourage weight loss.  Ulcerative colitis -Dr. Fuller Plan  Chronic diastolic heart failure -Occasional diuretic. Fluid restriction, salt restriction. Normal renal function. In part secondary to obesity.  Hyperlipidemia -Atorvastatin, LDL less than 70. No changes.      Current medicines are reviewed at length with the patient today.  The patient does not have concerns regarding medicines.  The following changes have been made:  no change  Labs/ tests ordered today include: none  No orders of the defined types were placed in this encounter.     Disposition:   FU with [facility] in 6 months  Signed, Candee Furbish, MD  04/01/2015 12:25 PM    [facility] [address], [place], [place]  [address] Phone: [phone]; Fax: [phone]

## 2015-04-01 NOTE — Patient Instructions (Signed)
Medication Instructions:  The current medical regimen is effective;  continue present plan and medications.  Follow-Up: Follow up in 6 months with Dr. Marlou Porch.  You will receive a letter in the mail 2 months before you are due.  Please call us when you receive this letter to schedule your follow up appointment.  If you need a refill on your cardiac medications before your next appointment, please call your pharmacy.  Thank you for choosing [facility]!!

## 2015-04-05 ENCOUNTER — Telehealth: Payer: Self-pay | Admitting: Family Medicine

## 2015-04-05 NOTE — Telephone Encounter (Signed)
Ms. Tarpley called saying she ordered diabetic shoes and wants Dr. Sarajane Jews to "look out" for the paperwork that's coming in that he'll need to sign. Please call the pt if you have questions or concerns.  Pt's ph# [phone] Thank you.

## 2015-04-11 ENCOUNTER — Ambulatory Visit (INDEPENDENT_AMBULATORY_CARE_PROVIDER_SITE_OTHER): Payer: Medicare Other | Admitting: Physician Assistant

## 2015-04-11 ENCOUNTER — Encounter: Payer: Self-pay | Admitting: Physician Assistant

## 2015-04-11 VITALS — BP 122/68 | HR 72 | Ht 61.0 in | Wt 210.4 lb

## 2015-04-11 DIAGNOSIS — K51918 Ulcerative colitis, unspecified with other complication: Secondary | ICD-10-CM | POA: Diagnosis not present

## 2015-04-11 DIAGNOSIS — I2510 Atherosclerotic heart disease of native coronary artery without angina pectoris: Secondary | ICD-10-CM | POA: Diagnosis not present

## 2015-04-11 MED ORDER — BUDESONIDE 9 MG PO TB24: 1.0000 | ORAL_TABLET | Freq: Every morning | ORAL | Status: DC

## 2015-04-11 NOTE — Patient Instructions (Addendum)
Continue the Rowasa enemas. Continue the Canasa suppositores at bedtime. Continue the Delzicol 800 mg by mouth 1 tablet,  3 times daily.  We have given you samples of Uceris, 9 mg tablets. Take 1 tablet daily . We gave you enough for 20 days.  You should take it for 6 weeks.  We sent a prescription for 22 pills.  (You need 42 tablets.)

## 2015-04-11 NOTE — Progress Notes (Signed)
Reviewed and agree with management plan.  Malcolm T. Stark, MD FACG 

## 2015-04-11 NOTE — Progress Notes (Signed)
Patient ID: [name], female   DOB: 08-23-1940, 74 y.o.   MRN: [id number]   Subjective:    Patient ID: [name], female    DOB: [birth year], 74 y.o.   MRN: [id number]  HPI  [name] is a pleasant 74 year old African-American female known to Dr. Fuller Plan with history of left-sided ulcerative colitis. She was last seen on 03/22/2015 with complaints of diarrhea and mucoid stool with intermittent bright red blood and continued abdominal cramping with postprandial urgency. She was having 3-4 bowel movements per day. At that time she was started on prednisone 40 mg by mouth daily for 2 weeks and was to taper down by 5 mg per week thereafter. She was also restarted on Canasa suppositories at bedtime and Rowasa enemas twice daily. She comes in today for follow-up. She says she took the prednisone for about 2-1/2 weeks and then stopped it. She had been in to see her PCP and her hemoglobin A1c was up to 9,  metformin dosage was increased and she decided to stop the prednisone. She says she is feeling better is down to 2-3 bowel movements per day is not been seeing any blood. She continues to have some mid abdominal pain and cramping.  Review of Systems Pertinent positive and negative review of systems were noted in the above HPI section.  All other review of systems was otherwise negative.  Outpatient Encounter Prescriptions as of 04/11/2015  Medication Sig  . ACCU-CHEK FASTCLIX LANCETS MISC CHECK BLOOD SUGAR ONCE DAILY DX 250.00  . acetaminophen (TYLENOL) 500 MG tablet Take 500 mg by mouth every 6 (six) hours as needed.  Marland Kitchen albuterol (PROVENTIL) (2.5 MG/3ML) 0.083% nebulizer solution Take 3 mLs (2.5 mg total) by nebulization every 4 (four) hours as needed for wheezing or shortness of breath.  Marland Kitchen albuterol (VENTOLIN HFA) 108 (90 BASE) MCG/ACT inhaler Inhale 2 puffs into the lungs every 4 (four) hours as needed for wheezing or shortness of breath.  . ALPRAZolam (XANAX) 0.5 MG tablet TAKE 1 TABLET BY MOUTH 3  TIMES A DAY  . amLODipine (NORVASC) 10 MG tablet Take 1 tablet (10 mg total) by mouth daily.  Marland Kitchen atenolol (TENORMIN) 50 MG tablet TAKE 1 TABLET BY MOUTH DAILY.  Marland Kitchen atorvastatin (LIPITOR) 10 MG tablet Take 1 tablet (10 mg total) by mouth daily.  . chlorpheniramine-HYDROcodone (TUSSIONEX PENNKINETIC ER) 10-8 MG/5ML LQCR Take 5 mLs by mouth every 12 (twelve) hours as needed for cough.  . Cholecalciferol (VITAMIN D-3) 5000 UNITS TABS Take 5,000 Units by mouth daily.    . clopidogrel (PLAVIX) 75 MG tablet Take 1 tablet (75 mg total) by mouth daily.  . diclofenac sodium (VOLTAREN) 1 % GEL Apply 2 g topically 4 (four) times daily.  . Diphenhyd-Hydrocort-Nystatin (FIRST-DUKES MOUTHWASH) SUSP 1 tsp gargle and swallow four times daily as needed  . diphenoxylate-atropine (LOMOTIL) 2.5-0.025 MG tablet Take 1 tab 3 times daily , take 1 every morning , then as needed.  . fluticasone (FLONASE) 50 MCG/ACT nasal spray PLACE 2 SPRAYS INTO THE NOSE AT BEDTIME. (Patient taking differently: Place 2 sprays into both nostrils at bedtime. PLACE 2 SPRAYS INTO THE NOSE AT BEDTIME.)  . furosemide (LASIX) 40 MG tablet TAKE 1 TABLET (40 MG TOTAL) BY MOUTH 3 (THREE) TIMES DAILY.  Marland Kitchen glimepiride (AMARYL) 1 MG tablet TAKE 1 TABLET BY MOUTH DAILY BEFORE BREAKFAST.  Marland Kitchen glucose blood (ONETOUCH VERIO) test strip 1 each by Other route daily. Use as instructed  . HYDROcodone-acetaminophen (NORCO/VICODIN) 5-325 MG per tablet Take 2  tablets by mouth every 6 (six) hours as needed for severe pain.  . hydrocortisone (PROCTOSOL HC) 2.5 % rectal cream Place rectally 3 (three) times daily.  . isosorbide mononitrate (IMDUR) 60 MG 24 hr tablet TAKE 1 TABLET BY MOUTH DAILY.  Marland Kitchen KLOR-CON M20 20 MEQ tablet TAKE 1 TABLET BY MOUTH 2 TIMES DAILY.  Marland Kitchen levocetirizine (XYZAL) 5 MG tablet Take 1 tablet (5 mg total) by mouth every evening.  Marland Kitchen LIDODERM 5 % APPLY 1 PATCH TO SKIN FOR 12 HOURS THEN REMOVE AND DISCARD PATCH WITHIN 72 HOURS OR AS DIRECTED  .  loperamide (IMODIUM) 2 MG capsule Take by mouth as needed for diarrhea or loose stools.  . Loperamide-Simethicone (IMODIUM MULTI-SYMPTOM RELIEF) 2-125 MG TABS Take 1 tablet by mouth as needed.  Marland Kitchen losartan (COZAAR) 50 MG tablet TAKE 1 TABLET BY MOUTH DAILY.  . magnesium oxide (MAG-OX) 400 (241.3 MG) MG tablet TAKE 1 TABLET (400 MG TOTAL) BY MOUTH DAILY.  . mesalamine (CANASA) 1000 MG suppository Place 1 suppository (1,000 mg total) rectally at bedtime.  . Mesalamine (DELZICOL) 400 MG CPDR DR capsule Take 2  ( 400 mg ) capsules 3 times daily.  . mesalamine (ROWASA) 4 G enema Place 60 mLs (4 g total) rectally 2 (two) times daily. Place enema rectally at bedtime---Do for two weeks and then as needed  . metFORMIN (GLUCOPHAGE) 1000 MG tablet Take 1 tablet (1,000 mg total) by mouth 2 (two) times daily with a meal.  . montelukast (SINGULAIR) 10 MG tablet TAKE 1 TABLET AT BEDTIME  . nabumetone (RELAFEN) 750 MG tablet TAKE 1 TABLET BY MOUTH TWICE A DAY WITH A MEAL  . Naftifine HCl (NAFTIN) 2 % CREA Apply topically 2 (two) times daily.  Marland Kitchen neomycin-polymyxin-hydrocortisone (CORTISPORIN) otic solution Use 1 drop in affected ear three times daily as needed  . nitroGLYCERIN (NITROSTAT) 0.4 MG SL tablet Place 1 tablet (0.4 mg total) under the tongue every 5 (five) minutes as needed for chest pain.  Marland Kitchen nystatin (MYCOSTATIN) 100000 UNIT/ML suspension GARGLE & SWALLOW 1 TEASPOONFUL 4 TIMES A DAY  . ondansetron (ZOFRAN) 4 MG tablet Take 1 tablet (4 mg total) by mouth every 6 (six) hours as needed for nausea or vomiting.  . pantoprazole (PROTONIX) 40 MG tablet TAKE 1 TABLET BY MOUTH DAILY AT 12 NOON.  Marland Kitchen predniSONE (DELTASONE) 10 MG tablet Take 1 tablet (10 mg total) by mouth daily with breakfast.  . promethazine (PHENERGAN) 25 MG tablet Take 25 mg by mouth 2 (two) times daily as needed. For nausea  . saccharomyces boulardii (FLORASTOR) 250 MG capsule Take 1 capsule (250 mg total) by mouth 2 (two) times daily.  .  SYMBICORT 80-4.5 MCG/ACT inhaler   . tiZANidine (ZANAFLEX) 4 MG tablet TAKE 1 CAPSULE (4 MG TOTAL) BY MOUTH 3 (THREE) TIMES DAILY AS NEEDED FOR MUSCLE SPASMS.  . Budesonide (UCERIS) 9 MG TB24 Take 1 tablet by mouth every morning.   No facility-administered encounter medications on file as of 04/11/2015.   Allergies  Allergen Reactions  . Doxycycline Other (See Comments)    Unsteady gait  . Amoxicillin   . Aspirin Nausea And Vomiting  . Azithromycin     REACTION: pt states "ZPak doesn't work"  . Caffeine Nausea And Vomiting  . Ciprofloxacin Nausea And Vomiting  . Codeine Nausea And Vomiting  . Sulfamethizole   . Tramadol Hcl     REACTION: pt states "spaced-out"   Patient Active Problem List   Diagnosis Date Noted  . Cough  10/24/2013  . CAP (community acquired pneumonia) 10/07/2013  . Feeling grief 08/21/2013  . Ejection fraction   . UC (ulcerative colitis) ([place]) 08/04/2013  . Diarrhea 08/04/2013  . Back pain, chronic 02/06/2012  . Hyperlipidemia 12/07/2011  . Hematuria 11/27/2011  . Syncope and collapse 11/25/2011  . Ulcerative colitis ([place]) 11/25/2011  . Chronic diastolic CHF (congestive heart failure) ([place]) 11/25/2011  . Lower extremity edema 11/25/2011  . Nonischemic cardiomyopathy ([place]) 04/17/2011  . CAD (coronary artery disease)   . Bronchiectasis ([place])   . Essential hypertension   . PERSONAL HX COLONIC POLYPS 02/08/2010  . VITAMIN D DEFICIENCY 12/03/2009  . BRONCHITIS, ACUTE 02/28/2009  . NAUSEA AND VOMITING 12/06/2008  . Depression 08/10/2008  . PULMONARY NODULE 07/15/2008  . NEOPLASM, KIDNEY 03/16/2008  . MICROSCOPIC HEMATURIA 03/16/2008  . STRESS INCONTINENCE 03/16/2008  . COLONIC POLYPS 07/19/2007  . Diabetes mellitus without complication ([place]) 69/67/8938  . OBESITY 07/19/2007  . ANEMIA 07/19/2007  . Asthma 07/19/2007  . DIVERTICULOSIS OF COLON 07/19/2007  . ANXIETY 03/19/2007  . GERD 03/19/2007  . DEGENERATIVE JOINT DISEASE 03/19/2007  .  FIBROMYALGIA 03/19/2007  . ALLERGY 03/19/2007   Social History   Social History  . Marital Status: Divorced    Spouse Name: N/A  . Number of Children: 3  . Years of Education: N/A   Occupational History  . Retired    Social History Main Topics  . Smoking status: Former Smoker -- 2.00 packs/day for 15 years    Types: Cigarettes    Quit date: 04/30/1973  . Smokeless tobacco: Never Used     Comment: ex-smoker: smoked for 10-15 years up to 2ppd, quit 1975.  Marland Kitchen Alcohol Use: 0.0 oz/week    0 Standard drinks or equivalent per week     Comment: occ  . Drug Use: No  . Sexual Activity: No   Other Topics Concern  . Not on file   Social History Narrative    Ms. Jovel's family history includes Breast cancer in her daughter; Diabetes in her sister; Heart attack in her father; Hypertension in her sister; Parkinsonism in her mother; Pneumonia in her father; Sarcoidosis in her brother. There is no history of Colon cancer.      Objective:    Filed Vitals:   04/11/15 1346  BP: 122/68  Pulse: 72    Physical Exam  well-developed older African-American female in no acute distress, very pleasant blood pressure 122/68 pulse 72 height 5 foot 1 weight 210. HEENT; nontraumatic normocephalic EOMI PERRLA sclera anicteric, Cardiovascular ;regular rate and rhythm with S1-S2 no murmur or gallop, Pulmonary ;clear bilaterally abdomen soft, mildly tender in the lower abdomen and left mid, no guarding or rebound no palpable mass or hepatosplenomegaly bowel sounds are present, Rectal; exam not done, Extremities; no clubbing cyanosis or edema skin warm and dry, Neuropsych; mood and affect appropriate       Assessment & Plan:   #1  74 yo female with left sided ulcerative colitis with recent exacerbation - improved but not back to baseline. #2 AODM  Plan;  Will start Uceris 9 mg po daily x 6-8 weeks  Continue Rowasa enemas BID Continue Canasa supp qhs Continue Delzicol 800 mg po TID  Follow up  with Dr Fuller Plan or myself in one month   Amy S [place] PA-C 04/11/2015   Cc: [name], MD

## 2015-04-19 ENCOUNTER — Telehealth: Payer: Self-pay | Admitting: Family Medicine

## 2015-04-19 ENCOUNTER — Other Ambulatory Visit: Payer: Self-pay | Admitting: Family Medicine

## 2015-04-19 NOTE — Telephone Encounter (Signed)
Pt request refill of the following: HYDROcodone-acetaminophen (NORCO/VICODIN) 5-325 MG per tablet ° ° °Phamacy: ° °

## 2015-04-20 MED ORDER — HYDROCODONE-ACETAMINOPHEN 5-325 MG PO TABS: 2.0000 | ORAL_TABLET | Freq: Four times a day (QID) | ORAL | Status: DC | PRN

## 2015-04-20 NOTE — Telephone Encounter (Signed)
Script is ready for pick up, tried to reach pt and no answer.

## 2015-04-20 NOTE — Telephone Encounter (Signed)
done

## 2015-04-29 ENCOUNTER — Other Ambulatory Visit: Payer: Self-pay | Admitting: Urology

## 2015-04-29 DIAGNOSIS — N281 Cyst of kidney, acquired: Secondary | ICD-10-CM

## 2015-05-04 ENCOUNTER — Encounter: Payer: Self-pay | Admitting: Family Medicine

## 2015-05-04 ENCOUNTER — Ambulatory Visit (INDEPENDENT_AMBULATORY_CARE_PROVIDER_SITE_OTHER): Payer: Medicare Other | Admitting: Family Medicine

## 2015-05-04 VITALS — BP 126/74 | Temp 98.6°F | Ht 61.0 in | Wt 216.7 lb

## 2015-05-04 DIAGNOSIS — J209 Acute bronchitis, unspecified: Secondary | ICD-10-CM | POA: Diagnosis not present

## 2015-05-04 MED ORDER — HYDROCODONE-HOMATROPINE 5-1.5 MG/5ML PO SYRP: 5.0000 mL | ORAL_SOLUTION | ORAL | Status: DC | PRN

## 2015-05-04 MED ORDER — LEVOFLOXACIN 500 MG PO TABS: 500.0000 mg | ORAL_TABLET | Freq: Every day | ORAL | Status: AC

## 2015-05-04 NOTE — Progress Notes (Signed)
   Subjective:    Patient ID: [name], female    DOB: Jul 09, 1940, 75 y.o.   MRN: [id number]  HPI Here for 3 days of PND, chest congestion and coughing up yellow sputum. No fever. Using her nebulizer several times a day.   Review of Systems  Constitutional: Negative.   HENT: Positive for congestion and postnasal drip. Negative for sinus pressure and sore throat.   Eyes: Negative.   Respiratory: Positive for cough, chest tightness and wheezing. Negative for shortness of breath.   Cardiovascular: Negative.        Objective:   Physical Exam  Constitutional: She appears well-developed and well-nourished. No distress.  HENT:  Right Ear: External ear normal.  Left Ear: External ear normal.  Nose: Nose normal.  Mouth/Throat: Oropharynx is clear and moist.  Eyes: Conjunctivae are normal.  Neck: No thyromegaly present.  Cardiovascular: Normal rate, regular rhythm, normal heart sounds and intact distal pulses.   Pulmonary/Chest: Effort normal. No respiratory distress. She has no rales.  Scattered rhonchi and wheezes   Lymphadenopathy:    She has no cervical adenopathy.          Assessment & Plan:  Bronchitis, treat with Levaquin.

## 2015-05-04 NOTE — Progress Notes (Signed)
Pre visit review using our clinic review tool, if applicable. No additional management support is needed unless otherwise documented below in the visit note.

## 2015-05-06 ENCOUNTER — Other Ambulatory Visit: Payer: Self-pay | Admitting: Cardiology

## 2015-05-12 ENCOUNTER — Ambulatory Visit (HOSPITAL_COMMUNITY)
Admission: RE | Admit: 2015-05-12 | Discharge: 2015-05-12 | Disposition: A | Discharge status: Home / Self Care | Payer: Medicare Other | Source: Ambulatory Visit | Attending: Urology | Admitting: Urology

## 2015-05-12 DIAGNOSIS — R160 Hepatomegaly, not elsewhere classified: Secondary | ICD-10-CM | POA: Diagnosis not present

## 2015-05-12 DIAGNOSIS — N281 Cyst of kidney, acquired: Secondary | ICD-10-CM

## 2015-05-12 DIAGNOSIS — S20212A Contusion of left front wall of thorax, initial encounter: Secondary | ICD-10-CM | POA: Diagnosis not present

## 2015-05-12 DIAGNOSIS — M25512 Pain in left shoulder: Secondary | ICD-10-CM | POA: Diagnosis not present

## 2015-05-12 DIAGNOSIS — N289 Disorder of kidney and ureter, unspecified: Secondary | ICD-10-CM | POA: Diagnosis not present

## 2015-05-12 LAB — POCT I-STAT CREATININE: Creatinine, Ser: 0.5 mg/dL (ref 0.44–1.00)

## 2015-05-12 MED ORDER — GADOBENATE DIMEGLUMINE 529 MG/ML IV SOLN
20.0000 mL | Freq: Once | INTRAVENOUS | Status: AC | PRN
  Administered 2015-05-12: 20 mL via INTRAVENOUS

## 2015-05-13 ENCOUNTER — Ambulatory Visit (INDEPENDENT_AMBULATORY_CARE_PROVIDER_SITE_OTHER): Payer: Medicare Other | Admitting: Gastroenterology

## 2015-05-13 ENCOUNTER — Encounter: Payer: Self-pay | Admitting: Gastroenterology

## 2015-05-13 VITALS — BP 138/74 | HR 80 | Ht 61.0 in | Wt 217.0 lb

## 2015-05-13 DIAGNOSIS — K51318 Ulcerative (chronic) rectosigmoiditis with other complication: Secondary | ICD-10-CM

## 2015-05-13 DIAGNOSIS — K51518 Left sided colitis with other complication: Secondary | ICD-10-CM

## 2015-05-13 DIAGNOSIS — K5150 Left sided colitis without complications: Secondary | ICD-10-CM | POA: Insufficient documentation

## 2015-05-13 MED ORDER — BUDESONIDE 9 MG PO TB24: 1.0000 | ORAL_TABLET | Freq: Every morning | ORAL | Status: DC

## 2015-05-13 MED ORDER — MESALAMINE 400 MG PO CPDR: 1600.0000 mg | DELAYED_RELEASE_CAPSULE | Freq: Three times a day (TID) | ORAL | Status: DC

## 2015-05-13 NOTE — Patient Instructions (Addendum)
Stop using enemas.   We have changed your Delzicol to 430m 4 tablets by mouth three times a day. We sent a new prescription to your pharmacy.   Please continue Uceris for one more month then stop. We sent this to your pharmacy also.  Please follow up with Dr. SFuller Planin 6 months.   Thank you for choosing [facility] and [facility].  MPricilla Riffle SDagoberto Ligas, MD., FMarval Regal

## 2015-05-13 NOTE — Assessment & Plan Note (Signed)
Flare of left-sided ulcerative colitis symptoms are now under good control. DC Rowasa enemas. Increase Delzicol to 1.6 g 3 times a day. Continue Uceris 9 mg daily for 1 more month and then discontinue. Discontinue Canasa suppositories in 2 months. Patient is advised to call us if her symptoms do not remain under very good control. REV in 6 months.

## 2015-05-13 NOTE — Progress Notes (Signed)
History of Present Illness: This is a 75 year old female returning for follow-up of ulcerative colitis flare. She is accompanied by her daughter. About one month ago she was placed on Uceris and her symptoms have substantially improved. Her stools are somewhat loose but there is no blood or mucus she is having bowel movements once or twice daily. She feels her symptoms are 90% improved.  Current Medications, Allergies, Past Medical History, Past Surgical History, Family History and Social History were reviewed in [employer] record.  Physical Exam: General: Well developed, well nourished, no acute distress Head: Normocephalic and atraumatic Eyes:  sclerae anicteric, EOMI Ears: Normal auditory acuity Mouth: No deformity or lesions Lungs: Clear throughout to auscultation Heart: Regular rate and rhythm; no murmurs, rubs or bruits Abdomen: Soft, non tender and non distended. No masses, hepatosplenomegaly or hernias noted. Normal Bowel sounds Musculoskeletal: Symmetrical with no gross deformities  Pulses:  Normal pulses noted Extremities: No clubbing, cyanosis, edema or deformities noted Neurological: Alert oriented x 4, grossly nonfocal Psychological:  Alert and cooperative. Normal mood and affect  Assessment and Recommendations:

## 2015-05-17 ENCOUNTER — Other Ambulatory Visit: Payer: Self-pay | Admitting: Family Medicine

## 2015-05-17 ENCOUNTER — Other Ambulatory Visit: Payer: Self-pay | Admitting: Cardiology

## 2015-05-18 ENCOUNTER — Other Ambulatory Visit: Payer: Self-pay | Admitting: Family Medicine

## 2015-05-18 MED ORDER — ALBUTEROL SULFATE (2.5 MG/3ML) 0.083% IN NEBU
2.5000 mg | INHALATION_SOLUTION | RESPIRATORY_TRACT | Status: DC | PRN
Start: 2015-05-18 — End: 2016-04-25

## 2015-06-16 ENCOUNTER — Other Ambulatory Visit: Payer: Self-pay | Admitting: *Deleted

## 2015-06-16 ENCOUNTER — Other Ambulatory Visit: Payer: Self-pay | Admitting: Cardiology

## 2015-06-16 MED ORDER — PANTOPRAZOLE SODIUM 40 MG PO TBEC: DELAYED_RELEASE_TABLET | ORAL | Status: DC

## 2015-07-04 ENCOUNTER — Other Ambulatory Visit: Payer: Self-pay | Admitting: Family Medicine

## 2015-07-04 NOTE — Telephone Encounter (Signed)
Do we refill this for patient?

## 2015-07-05 ENCOUNTER — Other Ambulatory Visit: Payer: Self-pay | Admitting: Cardiology

## 2015-07-07 ENCOUNTER — Other Ambulatory Visit: Payer: Self-pay | Admitting: Gastroenterology

## 2015-07-11 ENCOUNTER — Ambulatory Visit (INDEPENDENT_AMBULATORY_CARE_PROVIDER_SITE_OTHER): Payer: Medicare Other | Admitting: Sports Medicine

## 2015-07-11 ENCOUNTER — Encounter: Payer: Self-pay | Admitting: Sports Medicine

## 2015-07-11 VITALS — Ht 60.0 in | Wt 215.0 lb

## 2015-07-11 DIAGNOSIS — M79671 Pain in right foot: Secondary | ICD-10-CM

## 2015-07-11 DIAGNOSIS — E119 Type 2 diabetes mellitus without complications: Secondary | ICD-10-CM

## 2015-07-11 DIAGNOSIS — L309 Dermatitis, unspecified: Secondary | ICD-10-CM

## 2015-07-11 DIAGNOSIS — M79672 Pain in left foot: Secondary | ICD-10-CM

## 2015-07-11 DIAGNOSIS — B351 Tinea unguium: Secondary | ICD-10-CM

## 2015-07-11 MED ORDER — BETAMETHASONE VALERATE 0.1 % EX CREA: TOPICAL_CREAM | Freq: Two times a day (BID) | CUTANEOUS | Status: DC

## 2015-07-11 NOTE — Progress Notes (Signed)
Patient ID: [name], female   DOB: May 23, 1940, 75 y.o.   MRN: [id number] Subjective: [name] is a 75 y.o. female patient with history of diabetes who presents to office today complaining of long, painful nails  while ambulating in shoes; unable to trim. Patient also is concerned about a rash on her left foot that hasnt went away after using antifungal creams as Rx by the office and OTC lamisil spray. Patient states that the glucose reading this morning was 125 mg/dl. Patient denies any new changes in medication or new problems. Patient denies any new cramping, numbness, burning or tingling in the legs.  Patient Active Problem List   Diagnosis Date Noted  . Left sided colitis ([place]) 05/13/2015  . Cough 10/24/2013  . CAP (community acquired pneumonia) 10/07/2013  . Feeling grief 08/21/2013  . Ejection fraction   . Diarrhea 08/04/2013  . Back pain, chronic 02/06/2012  . Hyperlipidemia 12/07/2011  . Hematuria 11/27/2011  . Syncope and collapse 11/25/2011  . Ulcerative colitis ([place]) 11/25/2011  . Chronic diastolic CHF (congestive heart failure) ([place]) 11/25/2011  . Lower extremity edema 11/25/2011  . Nonischemic cardiomyopathy ([place]) 04/17/2011  . CAD (coronary artery disease)   . Bronchiectasis ([place])   . Essential hypertension   . PERSONAL HX COLONIC POLYPS 02/08/2010  . VITAMIN D DEFICIENCY 12/03/2009  . BRONCHITIS, ACUTE 02/28/2009  . NAUSEA AND VOMITING 12/06/2008  . Depression 08/10/2008  . PULMONARY NODULE 07/15/2008  . NEOPLASM, KIDNEY 03/16/2008  . MICROSCOPIC HEMATURIA 03/16/2008  . STRESS INCONTINENCE 03/16/2008  . COLONIC POLYPS 07/19/2007  . Diabetes mellitus without complication ([place]) 62/22/9798  . OBESITY 07/19/2007  . ANEMIA 07/19/2007  . Asthma 07/19/2007  . DIVERTICULOSIS OF COLON 07/19/2007  . ANXIETY 03/19/2007  . GERD 03/19/2007  . DEGENERATIVE JOINT DISEASE 03/19/2007  . FIBROMYALGIA 03/19/2007  . ALLERGY 03/19/2007   Current Outpatient Prescriptions  on File Prior to Visit  Medication Sig Dispense Refill  . ACCU-CHEK FASTCLIX LANCETS MISC CHECK BLOOD SUGAR ONCE DAILY DX 250.00 102 each 1  . acetaminophen (TYLENOL) 500 MG tablet Take 500 mg by mouth every 6 (six) hours as needed.    Marland Kitchen albuterol (PROVENTIL) (2.5 MG/3ML) 0.083% nebulizer solution Take 3 mLs (2.5 mg total) by nebulization every 4 (four) hours as needed for wheezing or shortness of breath. 75 mL 11  . albuterol (VENTOLIN HFA) 108 (90 BASE) MCG/ACT inhaler Inhale 2 puffs into the lungs every 4 (four) hours as needed for wheezing or shortness of breath. 18 each 11  . ALPRAZolam (XANAX) 0.5 MG tablet TAKE 1 TABLET BY MOUTH 3 TIMES A DAY 90 tablet 5  . amLODipine (NORVASC) 10 MG tablet Take 1 tablet (10 mg total) by mouth daily. 90 tablet 1  . atenolol (TENORMIN) 50 MG tablet TAKE 1 TABLET BY MOUTH DAILY. 90 tablet 1  . atorvastatin (LIPITOR) 10 MG tablet Take 1 tablet (10 mg total) by mouth daily. 90 tablet 1  . Budesonide (UCERIS) 9 MG TB24 Take 1 tablet by mouth every morning. 30 tablet 0  . Cholecalciferol (VITAMIN D-3) 5000 UNITS TABS Take 5,000 Units by mouth daily.      . clopidogrel (PLAVIX) 75 MG tablet Take 1 tablet (75 mg total) by mouth daily. 90 tablet 3  . diclofenac sodium (VOLTAREN) 1 % GEL Apply 2 g topically 4 (four) times daily.    . Diphenhyd-Hydrocort-Nystatin (FIRST-DUKES MOUTHWASH) SUSP 1 tsp gargle and swallow four times daily as needed 120 mL 5  . diphenoxylate-atropine (LOMOTIL) 2.5-0.025 MG  tablet Take 1 tab 3 times daily , take 1 every morning , then as needed. 90 tablet 0  . fluticasone (FLONASE) 50 MCG/ACT nasal spray PLACE 2 SPRAYS INTO THE NOSE AT BEDTIME. (Patient taking differently: Place 2 sprays into both nostrils at bedtime. PLACE 2 SPRAYS INTO THE NOSE AT BEDTIME.) 16 g 3  . furosemide (LASIX) 40 MG tablet TAKE 1 TABLET (40 MG TOTAL) BY MOUTH 3 (THREE) TIMES DAILY. 270 tablet 2  . glimepiride (AMARYL) 1 MG tablet TAKE 1 TABLET BY MOUTH DAILY BEFORE  BREAKFAST. 90 tablet 3  . glucose blood (ONETOUCH VERIO) test strip 1 each by Other route daily. Use as instructed 100 each 12  . HYDROcodone-acetaminophen (NORCO/VICODIN) 5-325 MG tablet Take 2 tablets by mouth every 6 (six) hours as needed for severe pain. 120 tablet 0  . HYDROcodone-homatropine (HYDROMET) 5-1.5 MG/5ML syrup Take 5 mLs by mouth every 4 (four) hours as needed. 240 mL 0  . hydrocortisone (PROCTOSOL HC) 2.5 % rectal cream Place rectally 3 (three) times daily. 28.35 g 3  . isosorbide mononitrate (IMDUR) 60 MG 24 hr tablet TAKE 1 TABLET BY MOUTH DAILY. 90 tablet 3  . KLOR-CON M20 20 MEQ tablet TAKE 1 TABLET BY MOUTH 2 TIMES DAILY. 180 tablet 3  . levocetirizine (XYZAL) 5 MG tablet Take 1 tablet (5 mg total) by mouth every evening. 90 tablet 3  . LIDODERM 5 % APPLY 1 PATCH TO SKIN FOR 12 HOURS THEN REMOVE AND DISCARD PATCH WITHIN 72 HOURS OR AS DIRECTED 30 patch 2  . loperamide (IMODIUM) 2 MG capsule Take by mouth as needed for diarrhea or loose stools.    . Loperamide-Simethicone (IMODIUM MULTI-SYMPTOM RELIEF) 2-125 MG TABS Take 1 tablet by mouth as needed.    Marland Kitchen losartan (COZAAR) 50 MG tablet TAKE 1 TABLET BY MOUTH DAILY. 90 tablet 3  . magnesium oxide (MAG-OX) 400 (241.3 MG) MG tablet TAKE 1 TABLET (400 MG TOTAL) BY MOUTH DAILY. 30 tablet 0  . magnesium oxide (MAG-OX) 400 (241.3 Mg) MG tablet TAKE 1 TABLET (400 MG TOTAL) BY MOUTH DAILY. 90 tablet 0  . mesalamine (CANASA) 1000 MG suppository Place 1 suppository (1,000 mg total) rectally at bedtime. 30 suppository 3  . Mesalamine (DELZICOL) 400 MG CPDR DR capsule Take 4 capsules (1,600 mg total) by mouth 3 (three) times daily. 360 capsule 5  . metFORMIN (GLUCOPHAGE) 1000 MG tablet Take 1 tablet (1,000 mg total) by mouth 2 (two) times daily with a meal. 60 tablet 11  . montelukast (SINGULAIR) 10 MG tablet TAKE 1 TABLET AT BEDTIME 90 tablet 6  . nabumetone (RELAFEN) 750 MG tablet TAKE 1 TABLET BY MOUTH TWICE A DAY WITH A MEAL 180 tablet  1  . Naftifine HCl (NAFTIN) 2 % CREA Apply topically 2 (two) times daily.    Marland Kitchen neomycin-polymyxin-hydrocortisone (CORTISPORIN) otic solution Use 1 drop in affected ear three times daily as needed 10 mL 0  . nitroGLYCERIN (NITROSTAT) 0.4 MG SL tablet Place 1 tablet (0.4 mg total) under the tongue every 5 (five) minutes as needed for chest pain. 90 tablet 1  . nystatin (MYCOSTATIN) 100000 UNIT/ML suspension GARGLE & SWALLOW 1 TEASPOONFUL 4 TIMES A DAY 120 mL 3  . ondansetron (ZOFRAN) 4 MG tablet Take 1 tablet (4 mg total) by mouth every 6 (six) hours as needed for nausea or vomiting. 40 tablet 0  . pantoprazole (PROTONIX) 40 MG tablet TAKE 1 TABLET BY MOUTH DAILY AT 12 NOON. 90 tablet 3  .  predniSONE (DELTASONE) 10 MG tablet Take 1 tablet (10 mg total) by mouth daily with breakfast. 100 tablet 0  . promethazine (PHENERGAN) 25 MG tablet Take 25 mg by mouth 2 (two) times daily as needed. For nausea    . saccharomyces boulardii (FLORASTOR) 250 MG capsule Take 1 capsule (250 mg total) by mouth 2 (two) times daily. 60 capsule 0  . SYMBICORT 80-4.5 MCG/ACT inhaler   3  . tiZANidine (ZANAFLEX) 4 MG tablet TAKE 1 CAPSULE (4 MG TOTAL) BY MOUTH 3 (THREE) TIMES DAILY AS NEEDED FOR MUSCLE SPASMS. 90 tablet 1   No current facility-administered medications on file prior to visit.   Allergies  Allergen Reactions  . Doxycycline Other (See Comments)    Unsteady gait  . Amoxicillin   . Aspirin Nausea And Vomiting  . Azithromycin     REACTION: pt states "ZPak doesn't work"  . Caffeine Nausea And Vomiting  . Ciprofloxacin Nausea And Vomiting  . Codeine Nausea And Vomiting  . Sulfamethizole   . Tramadol Hcl     REACTION: pt states "spaced-out"    Recent Results (from the past 2160 hour(s))  I-STAT creatinine     Status: None   Collection Time: 05/12/15  8:29 AM  Result Value Ref Range   Creatinine, Ser 0.50 0.44 - 1.00 mg/dL    Objective: General: Patient is awake, alert, and oriented x 3 and in no  acute distress.  Integument: Skin is warm, dry and supple bilateral. Nails are tender, long, thickened and  dystrophic with subungual debris, consistent with onychomycosis, 1-5 bilateral. There is multiple small raised purple lesions at the medial aspect of the left foot with superimposed scaly skin that is purritic in nature with no signs of infection. No open lesions or preulcerative lesions present bilateral. Remaining integument unremarkable.  Vasculature:  Dorsalis Pedis pulse 1/4 bilateral. Posterior Tibial pulse  1/4 bilateral.  Capillary fill time <3 sec 1-5 bilateral. Scant hair growth to the level of the digits. Temperature gradient within normal limits. No varicosities present bilateral. No edema present bilateral.   Neurology: The patient has intact sensation measured with a 5.07/10g Semmes Weinstein Monofilament at all pedal sites bilateral . Vibratory sensation intact bilateral with tuning fork. No Babinski sign present bilateral.   Musculoskeletal:Mild asymptomatic bunion pedal deformities noted bilateral. Muscular strength 5/5 in all lower extremity muscular groups bilateral without pain on range of motion . No tenderness with calf compression bilateral.  Assessment and Plan: Problem List Items Addressed This Visit      Endocrine   Diabetes mellitus without complication ([place])    Other Visit Diagnoses    Dermatitis    -  Primary    Left foot    Relevant Medications    betamethasone valerate (VALISONE) 0.1 % cream    Dermatophytosis of nail        Foot pain, bilateral           -Examined patient. -Discussed and educated patient on diabetic foot care, especially with  regards to the vascular, neurological and musculoskeletal systems.  -Stressed the importance of good glycemic control and the detriment of not  controlling glucose levels in relation to the foot. -Mechanically debrided all nails 1-5 bilateral using sterile nail nipper and filed with dremel without incident   -Rx Valisone cream for left foot rash advised patient if not improved in 1 month will refer to dermatologist, Dr. [place] Crane for evaluation for any other recommendations -Answered all patient questions -Patient to return in 3  months for at risk foot care -Patient advised to call the office if any problems or questions arise in the meantime.  Landis Martins, DPM

## 2015-07-11 NOTE — Patient Instructions (Signed)
Diabetes and Foot Care Diabetes may cause you to have problems because of poor blood supply (circulation) to your feet and legs. This may cause the skin on your feet to become thinner, break easier, and heal more slowly. Your skin may become dry, and the skin may peel and crack. You may also have nerve damage in your legs and feet causing decreased feeling in them. You may not notice minor injuries to your feet that could lead to infections or more serious problems. Taking care of your feet is one of the most important things you can do for yourself.  HOME CARE INSTRUCTIONS  Wear shoes at all times, even in the house. Do not go barefoot. Bare feet are easily injured.  Check your feet daily for blisters, cuts, and redness. If you cannot see the bottom of your feet, use a mirror or ask someone for help.  Wash your feet with warm water (do not use hot water) and mild soap. Then pat your feet and the areas between your toes until they are completely dry. Do not soak your feet as this can dry your skin.  Apply a moisturizing lotion or petroleum jelly (that does not contain alcohol and is unscented) to the skin on your feet and to dry, brittle toenails. Do not apply lotion between your toes.  Trim your toenails straight across. Do not dig under them or around the cuticle. File the edges of your nails with an emery board or nail file.  Do not cut corns or calluses or try to remove them with medicine.  Wear clean socks or stockings every day. Make sure they are not too tight. Do not wear knee-high stockings since they may decrease blood flow to your legs.  Wear shoes that fit properly and have enough cushioning. To break in new shoes, wear them for just a few hours a day. This prevents you from injuring your feet. Always look in your shoes before you put them on to be sure there are no objects inside.  Do not cross your legs. This may decrease the blood flow to your feet.  If you find a minor scrape,  cut, or break in the skin on your feet, keep it and the skin around it clean and dry. These areas may be cleansed with mild soap and water. Do not cleanse the area with peroxide, alcohol, or iodine.  When you remove an adhesive bandage, be sure not to damage the skin around it.  If you have a wound, look at it several times a day to make sure it is healing.  Do not use heating pads or hot water bottles. They may burn your skin. If you have lost feeling in your feet or legs, you may not know it is happening until it is too late.  Make sure your health care provider performs a complete foot exam at least annually or more often if you have foot problems. Report any cuts, sores, or bruises to your health care provider immediately. SEEK MEDICAL CARE IF:   You have an injury that is not healing.  You have cuts or breaks in the skin.  You have an ingrown nail.  You notice redness on your legs or feet.  You feel burning or tingling in your legs or feet.  You have pain or cramps in your legs and feet.  Your legs or feet are numb.  Your feet always feel cold. SEEK IMMEDIATE MEDICAL CARE IF:   There is increasing redness,   swelling, or pain in or around a wound.  There is a red line that goes up your leg.  Pus is coming from a wound.  You develop a fever or as directed by your health care provider.  You notice a bad smell coming from an ulcer or wound.   This information is not intended to replace advice given to you by your health care provider. Make sure you discuss any questions you have with your health care provider.   Document Released: 04/13/2000 Document Revised: 12/17/2012 Document Reviewed: 09/23/2012 Elsevier Interactive Patient Education [employer].   It is OK to soak with Epsom salt or 1 cup of white distill Vinegar mixed with 8 cups of warm water for 15 mins once a week.

## 2015-07-12 ENCOUNTER — Telehealth: Payer: Self-pay | Admitting: Gastroenterology

## 2015-07-12 NOTE — Telephone Encounter (Signed)
Refill x 1 month

## 2015-07-12 NOTE — Telephone Encounter (Signed)
Patient states she tried to come off of Uceris and her symptoms returned with loose stools and and nausea. She states she is going out of town for 2 weeks and needs to have it refilled. Can patient have Uceris refilled at current dose?

## 2015-07-13 MED ORDER — BUDESONIDE 9 MG PO TB24: 1.0000 | ORAL_TABLET | Freq: Every morning | ORAL | Status: DC

## 2015-07-13 NOTE — Telephone Encounter (Signed)
Prescription sent to patient's pharmacy.

## 2015-07-14 ENCOUNTER — Telehealth: Payer: Self-pay | Admitting: Gastroenterology

## 2015-07-14 NOTE — Telephone Encounter (Signed)
I informed patient that we do not have samples currently in the office. Patient states its just really expensive and was told by [name] to call if she needed samples again. Told patient that we do carry samples normally but do not have right now. Patient thanked me for checking.

## 2015-07-18 ENCOUNTER — Other Ambulatory Visit: Payer: Self-pay | Admitting: Family Medicine

## 2015-07-18 NOTE — Telephone Encounter (Signed)
Pt needs new rx hydrocodone °

## 2015-07-19 MED ORDER — HYDROCODONE-ACETAMINOPHEN 5-325 MG PO TABS: 2.0000 | ORAL_TABLET | Freq: Four times a day (QID) | ORAL | Status: DC | PRN

## 2015-07-19 NOTE — Telephone Encounter (Signed)
rx ready for pick up and patient is aware

## 2015-07-19 NOTE — Telephone Encounter (Signed)
done

## 2015-07-25 ENCOUNTER — Other Ambulatory Visit: Payer: Self-pay | Admitting: Family Medicine

## 2015-07-26 ENCOUNTER — Other Ambulatory Visit: Payer: Self-pay | Admitting: Family Medicine

## 2015-08-04 DIAGNOSIS — M47816 Spondylosis without myelopathy or radiculopathy, lumbar region: Secondary | ICD-10-CM | POA: Diagnosis not present

## 2015-08-07 ENCOUNTER — Other Ambulatory Visit: Payer: Self-pay | Admitting: Cardiology

## 2015-08-09 ENCOUNTER — Other Ambulatory Visit: Payer: Self-pay | Admitting: Cardiology

## 2015-08-11 ENCOUNTER — Other Ambulatory Visit: Payer: Self-pay | Admitting: Cardiology

## 2015-08-11 ENCOUNTER — Other Ambulatory Visit: Payer: Self-pay | Admitting: Family Medicine

## 2015-08-18 DIAGNOSIS — M545 Low back pain: Secondary | ICD-10-CM | POA: Diagnosis not present

## 2015-09-08 DIAGNOSIS — M545 Low back pain: Secondary | ICD-10-CM | POA: Diagnosis not present

## 2015-09-08 DIAGNOSIS — M5416 Radiculopathy, lumbar region: Secondary | ICD-10-CM | POA: Diagnosis not present

## 2015-09-08 DIAGNOSIS — M47816 Spondylosis without myelopathy or radiculopathy, lumbar region: Secondary | ICD-10-CM | POA: Diagnosis not present

## 2015-09-29 ENCOUNTER — Encounter: Payer: Self-pay | Admitting: Cardiology

## 2015-09-29 ENCOUNTER — Ambulatory Visit (INDEPENDENT_AMBULATORY_CARE_PROVIDER_SITE_OTHER): Payer: Medicare Other | Admitting: Cardiology

## 2015-09-29 ENCOUNTER — Other Ambulatory Visit: Payer: Self-pay | Admitting: Family Medicine

## 2015-09-29 VITALS — BP 130/82 | HR 70 | Ht 60.0 in | Wt 216.0 lb

## 2015-09-29 DIAGNOSIS — I2583 Coronary atherosclerosis due to lipid rich plaque: Principal | ICD-10-CM

## 2015-09-29 DIAGNOSIS — E669 Obesity, unspecified: Secondary | ICD-10-CM

## 2015-09-29 DIAGNOSIS — I2510 Atherosclerotic heart disease of native coronary artery without angina pectoris: Secondary | ICD-10-CM | POA: Diagnosis not present

## 2015-09-29 DIAGNOSIS — E785 Hyperlipidemia, unspecified: Secondary | ICD-10-CM | POA: Diagnosis not present

## 2015-09-29 DIAGNOSIS — I10 Essential (primary) hypertension: Secondary | ICD-10-CM | POA: Diagnosis not present

## 2015-09-29 NOTE — Patient Instructions (Signed)
Medication Instructions:  The current medical regimen is effective;  continue present plan and medications.  Follow-Up: Follow up in 6 months with Dr. Marlou Porch.  You will receive a letter in the mail 2 months before you are due.  Please call us when you receive this letter to schedule your follow up appointment.  If you need a refill on your cardiac medications before your next appointment, please call your pharmacy.  Thank you for choosing [facility]!!

## 2015-09-29 NOTE — Progress Notes (Signed)
Cardiology Office Note   Date:  09/29/2015   ID:  Tziporah Knoke, DOB 06-11-1940, MRN [id number]  PCP:  [name], MD  Cardiologist:   Candee Furbish, MD       History of Present Illness: [name] is a 75 y.o. female former patient of Dr. Ron Parker who in 2002 had an BMS Express 3.5 x 20 mm stent placed in the proximal LAD with 3 subsequent catheterization showing patent stent here for follow-up.  Physical history of not being able to tolerate aspirin. Stomach pain.  She remains on Plavix long-term.  Left arm focal pain sometimes, right as well. Likely noncardiac. She also discussed occasional right-sided jaw pain at rest. Nonexertional. This seems to be relieved when she stretches surgical back. Lives with son and his wife. Son encourages NTG use.   Has ulcerative colitis. Had trouble at the beach. Back, disk trouble. Discouraged by body aches.   Past Medical History  Diagnosis Date  . CHF (congestive heart failure) (HCC)     Diastolic  . Volume overload   . Bronchiectasis   . Hypertension   . CAD (coronary artery disease)     BMS to the LAD, 2002; cath 12/07/11 patent LAD stent and mild nonobstructive disease, EF 65%; Medical management  . Diabetes mellitus   . Obesity   . GERD (gastroesophageal reflux disease)   . Fibromyalgia   . Anxiety   . Allergy, unspecified not elsewhere classified   . Pulmonary nodule     Negative PET in 2010  . Other diseases of lung, not elsewhere classified   . Hypercholesterolemia   . Diverticulosis of colon   . Ulcerative colitis, left sided ([place]) 2008    Hx of  . Adenomatous colon polyp 02/2006  . Microscopic hematuria   . Neoplasm of kidney   . Stress incontinence, female   . DJD (degenerative joint disease)   . Vitamin D deficiency disease   . Anemia   . Syncope   . UTI (lower urinary tract infection)   . Arthritis   . Pinched vertebral nerve   . Ejection fraction     Past Surgical History  Procedure Laterality Date  .  Cholecystectomy    . Hand surgery  01/2006    Right Hand Surgery by Dr. Amedeo Plenty  . Coronary stent placement  2002    BMS to the LAD  . Abdominal hysterectomy    . Cardiac catheterization  11/2011  . Colonoscopy w/ biopsies  11-12-12    per Dr. Fuller Plan, diverticulosis only, repeat in 5 yrs  . Esophagogastroduodenoscopy  12/2008  . Left heart catheterization with coronary angiogram N/A 12/07/2011    Procedure: LEFT HEART CATHETERIZATION WITH CORONARY ANGIOGRAM;  Surgeon: Peter M Martinique, MD;  Location: [facility] CATH LAB;  Service: Cardiovascular;  Laterality: N/A;     Current Outpatient Prescriptions  Medication Sig Dispense Refill  . ACCU-CHEK FASTCLIX LANCETS MISC CHECK BLOOD SUGAR ONCE DAILY DX 250.00 102 each 1  . acetaminophen (TYLENOL) 500 MG tablet Take 500 mg by mouth every 6 (six) hours as needed.    Marland Kitchen albuterol (PROVENTIL) (2.5 MG/3ML) 0.083% nebulizer solution Take 3 mLs (2.5 mg total) by nebulization every 4 (four) hours as needed for wheezing or shortness of breath. 75 mL 11  . albuterol (VENTOLIN HFA) 108 (90 BASE) MCG/ACT inhaler Inhale 2 puffs into the lungs every 4 (four) hours as needed for wheezing or shortness of breath. 18 each 11  . ALPRAZolam (XANAX) 0.5 MG tablet  TAKE 1 TABLET BY MOUTH 3 TIMES A DAY 90 tablet 5  . amLODipine (NORVASC) 10 MG tablet TAKE 1 TABLET BY MOUTH DAILY. 90 tablet 1  . atenolol (TENORMIN) 50 MG tablet TAKE 1 TABLET BY MOUTH DAILY. 90 tablet 1  . atorvastatin (LIPITOR) 10 MG tablet Take 1 tablet (10 mg total) by mouth daily. 90 tablet 1  . betamethasone valerate (VALISONE) 0.1 % cream Apply topically 2 (two) times daily. 30 g 2  . Budesonide (UCERIS) 9 MG TB24 Take 1 tablet by mouth every morning. 30 tablet 0  . Cholecalciferol (VITAMIN D-3) 5000 UNITS TABS Take 5,000 Units by mouth daily.      . clopidogrel (PLAVIX) 75 MG tablet TAKE 1 TABLET (75 MG TOTAL) BY MOUTH DAILY. 90 tablet 3  . diclofenac sodium (VOLTAREN) 1 % GEL Apply 2 g topically 4 (four) times  daily.    . diphenoxylate-atropine (LOMOTIL) 2.5-0.025 MG tablet Take 1 tab 3 times daily , take 1 every morning , then as needed. 90 tablet 0  . fluticasone (FLONASE) 50 MCG/ACT nasal spray PLACE 2 SPRAYS IN EACH NOSTRIL AT BEDTIME 16 g 6  . furosemide (LASIX) 40 MG tablet TAKE 1 TABLET (40 MG TOTAL) BY MOUTH 3 (THREE) TIMES DAILY. 270 tablet 2  . glimepiride (AMARYL) 1 MG tablet TAKE 1 TABLET BY MOUTH DAILY BEFORE BREAKFAST. 90 tablet 3  . glucose blood (ONETOUCH VERIO) test strip 1 each by Other route daily. Use as instructed 100 each 12  . HYDROcodone-acetaminophen (NORCO/VICODIN) 5-325 MG tablet Take 2 tablets by mouth every 6 (six) hours as needed for severe pain. 120 tablet 0  . HYDROcodone-homatropine (HYDROMET) 5-1.5 MG/5ML syrup Take 5 mLs by mouth every 4 (four) hours as needed. 240 mL 0  . hydrocortisone (PROCTOSOL HC) 2.5 % rectal cream Place rectally 3 (three) times daily. 28.35 g 3  . isosorbide mononitrate (IMDUR) 60 MG 24 hr tablet TAKE 1 TABLET BY MOUTH DAILY. 90 tablet 3  . KLOR-CON M20 20 MEQ tablet TAKE 1 TABLET BY MOUTH 2 TIMES DAILY. 180 tablet 3  . levocetirizine (XYZAL) 5 MG tablet Take 1 tablet (5 mg total) by mouth every evening. 90 tablet 3  . LIDODERM 5 % APPLY 1 PATCH TO SKIN FOR 12 HOURS THEN REMOVE AND DISCARD PATCH WITHIN 72 HOURS OR AS DIRECTED 30 patch 2  . Loperamide-Simethicone (IMODIUM MULTI-SYMPTOM RELIEF) 2-125 MG TABS Take 1 tablet by mouth as needed.    Marland Kitchen losartan (COZAAR) 50 MG tablet TAKE 1 TABLET BY MOUTH DAILY. 90 tablet 3  . magnesium oxide (MAG-OX) 400 (241.3 Mg) MG tablet TAKE 1 TABLET (400 MG TOTAL) BY MOUTH DAILY. 90 tablet 0  . mesalamine (CANASA) 1000 MG suppository Place 1 suppository (1,000 mg total) rectally at bedtime. 30 suppository 3  . Mesalamine (DELZICOL) 400 MG CPDR DR capsule Take 4 capsules (1,600 mg total) by mouth 3 (three) times daily. 360 capsule 5  . metFORMIN (GLUCOPHAGE) 1000 MG tablet Take 1 tablet (1,000 mg total) by mouth 2  (two) times daily with a meal. 60 tablet 11  . montelukast (SINGULAIR) 10 MG tablet TAKE 1 TABLET AT BEDTIME 90 tablet 6  . nabumetone (RELAFEN) 750 MG tablet TAKE 1 TABLET BY MOUTH TWICE A DAY WITH A MEAL 180 tablet 3  . Naftifine HCl (NAFTIN) 2 % CREA Apply topically 2 (two) times daily.    Marland Kitchen neomycin-polymyxin-hydrocortisone (CORTISPORIN) otic solution Use 1 drop in affected ear three times daily as needed 10 mL  0  . nitroGLYCERIN (NITROSTAT) 0.4 MG SL tablet Place 1 tablet (0.4 mg total) under the tongue every 5 (five) minutes as needed for chest pain. 90 tablet 1  . ondansetron (ZOFRAN) 4 MG tablet Take 1 tablet (4 mg total) by mouth every 6 (six) hours as needed for nausea or vomiting. 40 tablet 0  . pantoprazole (PROTONIX) 40 MG tablet TAKE 1 TABLET BY MOUTH DAILY AT 12 NOON. 90 tablet 3  . PAZEO 0.7 % SOLN INSTILL 1 DROP INTO BOTH EYES SINGLE DOSE  99  . predniSONE (DELTASONE) 10 MG tablet Take 1 tablet (10 mg total) by mouth daily with breakfast. 100 tablet 0  . SYMBICORT 80-4.5 MCG/ACT inhaler Inhale 2 puffs into the lungs daily as needed (shortness of breath).   3  . tiZANidine (ZANAFLEX) 4 MG tablet TAKE 1 TABLET (4 MG TOTAL) BY MOUTH 3 (THREE) TIMES DAILY AS NEEDED FOR MUSCLE SPASMS. 90 tablet 1   No current facility-administered medications for this visit.    Allergies:   Doxycycline; Amoxicillin; Aspirin; Azithromycin; Caffeine; Ciprofloxacin; Codeine; Sulfamethizole; and Tramadol hcl    Social History:  The patient  reports that she quit smoking about 42 years ago. Her smoking use included Cigarettes. She has a 30 pack-year smoking history. She has never used smokeless tobacco. She reports that she drinks alcohol. She reports that she does not use illicit drugs.   Family History:  [name] family history includes Breast cancer in her daughter; Diabetes in her sister; Heart attack in her father; Hypertension in her sister; Parkinsonism in her mother; Pneumonia in her father;  Sarcoidosis in her brother. There is no history of Colon cancer.    ROS:  Please see the history of present illness.   Otherwise, review of systems are positive for none.   All other systems are reviewed and negative.    PHYSICAL EXAM: VS:  BP 130/82 mmHg  Pulse 70  Ht 5' (1.524 m)  Wt 216 lb (97.977 kg)  BMI 42.18 kg/m2 , BMI Body mass index is 42.18 kg/(m^2). GEN: Well nourished, well developed, in no acute distress HEENT: normal Neck: no JVD, carotid bruits, or masses Cardiac: RRR; no murmurs, rubs, or gallops, trace lower extremity bilateral edema  Respiratory:  clear to auscultation bilaterally, normal work of breathing GI: soft, nontender, nondistended, + BS, overweight MS: no deformity or atrophy Skin: warm and dry, no rash Neuro:  Strength and sensation are intact Psych: euthymic mood, full affect   EKG:  09/29/15-EKG ordered today-normal sinus rhythm, 70, biatrial enlargement, no other abnormalities. Personally viewed  Recent Labs: 03/15/2015: ALT 6; BUN 10; Hemoglobin 12.4; Platelets 443.0*; Potassium 4.9; Sodium 144; TSH 1.04 05/12/2015: Creatinine, Ser 0.50    Lipid Panel    Component Value Date/Time   CHOL 132 03/15/2015 1014   TRIG 84.0 03/15/2015 1014   HDL 51.50 03/15/2015 1014   CHOLHDL 3 03/15/2015 1014   VLDL 16.8 03/15/2015 1014   LDLCALC 64 03/15/2015 1014      Wt Readings from Last 3 Encounters:  09/29/15 216 lb (97.977 kg)  07/11/15 215 lb (97.523 kg)  05/13/15 217 lb (98.431 kg)      Other studies Reviewed: Additional studies/ records that were reviewed today include: Prior office notes, lab work, catheterization reports. Review of the above records demonstrates: As above   ASSESSMENT AND PLAN:  Coronary artery disease -2002 proximal LAD bare-metal stent, express stent, Dr. Maurene Capes. -3 subsequent catheterizations reassuring. Last catheterization 2013. -No symptoms currently. Doing  well. Secondary prevention. -Moderate disease treated  medically in other vessels. Aspirin intolerance -Stomach issues if taking aspirin -Continue Plavix lifelong.  Obesity -Encourage weight loss.This is been trouble at times with her back pain. She is to like walking in the neighborhood but this is hard for her to do now.  Ulcerative colitis -Dr. Fuller Plan  Chronic diastolic heart failure -Occasional diuretic. Fluid restriction, salt restriction. Normal renal function. In part secondary to obesity.  Hyperlipidemia -Atorvastatin, LDL less than 70. No changes.  Essential hypertension -Multidrug regimen reviewed. Good blood pressure control.    Current medicines are reviewed at length with the patient today.  The patient does not have concerns regarding medicines.  The following changes have been made:  no change  Labs/ tests ordered today include: none   Orders Placed This Encounter  Procedures  . EKG 12-Lead     Disposition:   FU with Kathrin Folden in 6 months  Signed, Candee Furbish, MD  09/29/2015 10:22 AM    [facility] [address], [place], [place]  [address] Phone: [phone]; Fax: [phone]

## 2015-09-30 NOTE — Telephone Encounter (Signed)
Call in #[phone] with [phone] 

## 2015-10-03 DIAGNOSIS — M5106 Intervertebral disc disorders with myelopathy, lumbar region: Secondary | ICD-10-CM | POA: Diagnosis not present

## 2015-10-04 DIAGNOSIS — N6459 Other signs and symptoms in breast: Secondary | ICD-10-CM | POA: Diagnosis not present

## 2015-10-04 DIAGNOSIS — N63 Unspecified lump in breast: Secondary | ICD-10-CM | POA: Diagnosis not present

## 2015-10-12 ENCOUNTER — Encounter: Payer: Self-pay | Admitting: Family Medicine

## 2015-10-18 ENCOUNTER — Encounter: Payer: Self-pay | Admitting: Sports Medicine

## 2015-10-18 ENCOUNTER — Ambulatory Visit (INDEPENDENT_AMBULATORY_CARE_PROVIDER_SITE_OTHER): Payer: Medicare Other | Admitting: Sports Medicine

## 2015-10-18 DIAGNOSIS — I2510 Atherosclerotic heart disease of native coronary artery without angina pectoris: Secondary | ICD-10-CM | POA: Diagnosis not present

## 2015-10-18 DIAGNOSIS — M79671 Pain in right foot: Secondary | ICD-10-CM | POA: Diagnosis not present

## 2015-10-18 DIAGNOSIS — M79672 Pain in left foot: Secondary | ICD-10-CM

## 2015-10-18 DIAGNOSIS — B351 Tinea unguium: Secondary | ICD-10-CM

## 2015-10-18 DIAGNOSIS — E119 Type 2 diabetes mellitus without complications: Secondary | ICD-10-CM | POA: Diagnosis not present

## 2015-10-18 DIAGNOSIS — M7661 Achilles tendinitis, right leg: Secondary | ICD-10-CM

## 2015-10-18 DIAGNOSIS — B359 Dermatophytosis, unspecified: Secondary | ICD-10-CM

## 2015-10-18 MED ORDER — TRIAMCINOLONE ACETONIDE 10 MG/ML IJ SUSP: 10.0000 mg | Freq: Once | INTRAMUSCULAR | Status: DC

## 2015-10-18 MED ORDER — CLOTRIMAZOLE 1 % EX CREA: 1.0000 "application " | TOPICAL_CREAM | Freq: Two times a day (BID) | CUTANEOUS | Status: DC

## 2015-10-18 MED ORDER — CLOTRIMAZOLE 1 % EX SOLN: 1.0000 "application " | Freq: Two times a day (BID) | CUTANEOUS | Status: DC

## 2015-10-18 NOTE — Patient Instructions (Signed)
Achilles Tendinitis Achilles tendinitis is inflammation of the tough, cord-like band that attaches the lower muscles of your leg to your heel (Achilles tendon). It is usually caused by overusing the tendon and joint involved.  CAUSES Achilles tendinitis can happen because of:  A sudden increase in exercise or activity (such as running).  Doing the same exercises or activities (such as jumping) over and over.  Not warming up calf muscles before exercising.  Exercising in shoes that are worn out or not made for exercise.  Having arthritis or a bone growth on the back of the heel bone. This can rub against the tendon and hurt the tendon. SIGNS AND SYMPTOMS The most common symptoms are:  Pain in the back of the leg, just above the heel. The pain usually gets worse with exercise and better with rest.  Stiffness or soreness in the back of the leg, especially in the morning.  Swelling of the skin over the Achilles tendon.  Trouble standing on tiptoe. Sometimes, an Achilles tendon tears (ruptures). Symptoms of an Achilles tendon rupture can include:  Sudden, severe pain in the back of the leg.  Trouble putting weight on the foot or walking normally. DIAGNOSIS Achilles tendinitis will be diagnosed based on symptoms and a physical examination. An X-ray may be done to check if another condition is causing your symptoms. An MRI may be ordered if your health care provider suspects you may have completely torn your tendon, which is called an Achilles tendon rupture.  TREATMENT  Achilles tendinitis usually gets better over time. It can take weeks to months to heal completely. Treatment focuses on treating the symptoms and helping the injury heal. HOME CARE INSTRUCTIONS   Rest your Achilles tendon and avoid activities that cause pain.  Apply ice to the injured area:  Put ice in a plastic bag.  Place a towel between your skin and the bag.  Leave the ice on for 20 minutes, 2-3 times a  day  Try to avoid using the tendon (other than gentle range of motion) while the tendon is painful. Do not resume use until instructed by your health care provider. Then begin use gradually. Do not increase use to the point of pain. If pain does develop, decrease use and continue the above measures. Gradually increase activities that do not cause discomfort until you achieve normal use.  Do exercises to make your calf muscles stronger and more flexible. Your health care provider or physical therapist can recommend exercises for you to do.  Wrap your ankle with an elastic bandage or other wrap. This can help keep your tendon from moving too much. Your health care provider will show you how to wrap your ankle correctly.  Only take over-the-counter or prescription medicines for pain, discomfort, or fever as directed by your health care provider. SEEK MEDICAL CARE IF:   Your pain and swelling increase or pain is uncontrolled with medicines.  You develop new, unexplained symptoms or your symptoms get worse.  You are unable to move your toes or foot.  You develop warmth and swelling in your foot.  You have an unexplained temperature. MAKE SURE YOU:   Understand these instructions.  Will watch your condition.  Will get help right away if you are not doing well or get worse.   This information is not intended to replace advice given to you by your health care provider. Make sure you discuss any questions you have with your health care provider.   Document Released:  01/24/2005 Document Revised: 05/07/2014 Document Reviewed: 11/26/2012 Elsevier Interactive Patient Education [employer].

## 2015-10-18 NOTE — Progress Notes (Signed)
Patient ID: Arantza Darrington, female   DOB: May 07, 1940, 75 y.o.   MRN: [id number]  Subjective: Emillie Chasen is a 75 y.o. female patient with history of diabetes who returns to office today complaining of long, painful nails  while ambulating in shoes; unable to trim. Patient also is concerned about continued scaly skin to left foot and NEW right heel pain at back that started >1 week ago that she treated with pain cream without improvement. Reports that she did more walking with vacation/trip at [facility]. Patient states that the glucose reading this morning was 12o mg/dl. Patient denies any new changes in medication or any other new problems. Patient denies any new cramping, numbness, burning or tingling in the legs.  Patient Active Problem List   Diagnosis Date Noted  . Left sided colitis ([place]) 05/13/2015  . Cough 10/24/2013  . CAP (community acquired pneumonia) 10/07/2013  . Feeling grief 08/21/2013  . Ejection fraction   . Diarrhea 08/04/2013  . Back pain, chronic 02/06/2012  . Hyperlipidemia 12/07/2011  . Hematuria 11/27/2011  . Syncope and collapse 11/25/2011  . Ulcerative colitis ([place]) 11/25/2011  . Chronic diastolic CHF (congestive heart failure) ([place]) 11/25/2011  . Lower extremity edema 11/25/2011  . Nonischemic cardiomyopathy ([place]) 04/17/2011  . CAD (coronary artery disease)   . Bronchiectasis ([place])   . Essential hypertension   . PERSONAL HX COLONIC POLYPS 02/08/2010  . VITAMIN D DEFICIENCY 12/03/2009  . BRONCHITIS, ACUTE 02/28/2009  . NAUSEA AND VOMITING 12/06/2008  . Depression 08/10/2008  . PULMONARY NODULE 07/15/2008  . NEOPLASM, KIDNEY 03/16/2008  . MICROSCOPIC HEMATURIA 03/16/2008  . STRESS INCONTINENCE 03/16/2008  . COLONIC POLYPS 07/19/2007  . Diabetes mellitus without complication ([place]) 52/11/221  . OBESITY 07/19/2007  . ANEMIA 07/19/2007  . Asthma 07/19/2007  . DIVERTICULOSIS OF COLON 07/19/2007  . ANXIETY 03/19/2007  . GERD 03/19/2007  . DEGENERATIVE  JOINT DISEASE 03/19/2007  . FIBROMYALGIA 03/19/2007  . ALLERGY 03/19/2007   Current Outpatient Prescriptions on File Prior to Visit  Medication Sig Dispense Refill  . ACCU-CHEK FASTCLIX LANCETS MISC CHECK BLOOD SUGAR ONCE DAILY DX 250.00 102 each 1  . acetaminophen (TYLENOL) 500 MG tablet Take 500 mg by mouth every 6 (six) hours as needed.    Marland Kitchen albuterol (PROVENTIL) (2.5 MG/3ML) 0.083% nebulizer solution Take 3 mLs (2.5 mg total) by nebulization every 4 (four) hours as needed for wheezing or shortness of breath. 75 mL 11  . albuterol (VENTOLIN HFA) 108 (90 BASE) MCG/ACT inhaler Inhale 2 puffs into the lungs every 4 (four) hours as needed for wheezing or shortness of breath. 18 each 11  . ALPRAZolam (XANAX) 0.5 MG tablet TAKE 1 TABLET BY MOUTH 3 TIMES A DAY 90 tablet 5  . amLODipine (NORVASC) 10 MG tablet TAKE 1 TABLET BY MOUTH DAILY. 90 tablet 1  . atenolol (TENORMIN) 50 MG tablet TAKE 1 TABLET BY MOUTH DAILY. 90 tablet 1  . atorvastatin (LIPITOR) 10 MG tablet Take 1 tablet (10 mg total) by mouth daily. 90 tablet 1  . betamethasone valerate (VALISONE) 0.1 % cream Apply topically 2 (two) times daily. 30 g 2  . Budesonide (UCERIS) 9 MG TB24 Take 1 tablet by mouth every morning. 30 tablet 0  . Cholecalciferol (VITAMIN D-3) 5000 UNITS TABS Take 5,000 Units by mouth daily.      . clopidogrel (PLAVIX) 75 MG tablet TAKE 1 TABLET (75 MG TOTAL) BY MOUTH DAILY. 90 tablet 3  . diclofenac sodium (VOLTAREN) 1 % GEL Apply 2 g topically  4 (four) times daily.    . diphenoxylate-atropine (LOMOTIL) 2.5-0.025 MG tablet Take 1 tab 3 times daily , take 1 every morning , then as needed. 90 tablet 0  . fluticasone (FLONASE) 50 MCG/ACT nasal spray PLACE 2 SPRAYS IN EACH NOSTRIL AT BEDTIME 16 g 6  . furosemide (LASIX) 40 MG tablet TAKE 1 TABLET (40 MG TOTAL) BY MOUTH 3 (THREE) TIMES DAILY. 270 tablet 2  . glimepiride (AMARYL) 1 MG tablet TAKE 1 TABLET BY MOUTH DAILY BEFORE BREAKFAST. 90 tablet 3  . glucose blood  (ONETOUCH VERIO) test strip 1 each by Other route daily. Use as instructed 100 each 12  . HYDROcodone-acetaminophen (NORCO/VICODIN) 5-325 MG tablet Take 2 tablets by mouth every 6 (six) hours as needed for severe pain. 120 tablet 0  . HYDROcodone-homatropine (HYDROMET) 5-1.5 MG/5ML syrup Take 5 mLs by mouth every 4 (four) hours as needed. 240 mL 0  . hydrocortisone (PROCTOSOL HC) 2.5 % rectal cream Place rectally 3 (three) times daily. 28.35 g 3  . isosorbide mononitrate (IMDUR) 60 MG 24 hr tablet TAKE 1 TABLET BY MOUTH DAILY. 90 tablet 3  . KLOR-CON M20 20 MEQ tablet TAKE 1 TABLET BY MOUTH 2 TIMES DAILY. 180 tablet 3  . levocetirizine (XYZAL) 5 MG tablet Take 1 tablet (5 mg total) by mouth every evening. 90 tablet 3  . LIDODERM 5 % APPLY 1 PATCH TO SKIN FOR 12 HOURS THEN REMOVE AND DISCARD PATCH WITHIN 72 HOURS OR AS DIRECTED 30 patch 2  . Loperamide-Simethicone (IMODIUM MULTI-SYMPTOM RELIEF) 2-125 MG TABS Take 1 tablet by mouth as needed.    Marland Kitchen losartan (COZAAR) 50 MG tablet TAKE 1 TABLET BY MOUTH DAILY. 90 tablet 3  . magnesium oxide (MAG-OX) 400 (241.3 Mg) MG tablet TAKE 1 TABLET (400 MG TOTAL) BY MOUTH DAILY. 90 tablet 0  . mesalamine (CANASA) 1000 MG suppository Place 1 suppository (1,000 mg total) rectally at bedtime. 30 suppository 3  . Mesalamine (DELZICOL) 400 MG CPDR DR capsule Take 4 capsules (1,600 mg total) by mouth 3 (three) times daily. 360 capsule 5  . metFORMIN (GLUCOPHAGE) 1000 MG tablet Take 1 tablet (1,000 mg total) by mouth 2 (two) times daily with a meal. 60 tablet 11  . montelukast (SINGULAIR) 10 MG tablet TAKE 1 TABLET AT BEDTIME 90 tablet 6  . nabumetone (RELAFEN) 750 MG tablet TAKE 1 TABLET BY MOUTH TWICE A DAY WITH A MEAL 180 tablet 3  . Naftifine HCl (NAFTIN) 2 % CREA Apply topically 2 (two) times daily.    Marland Kitchen neomycin-polymyxin-hydrocortisone (CORTISPORIN) otic solution Use 1 drop in affected ear three times daily as needed 10 mL 0  . nitroGLYCERIN (NITROSTAT) 0.4 MG SL  tablet Place 1 tablet (0.4 mg total) under the tongue every 5 (five) minutes as needed for chest pain. 90 tablet 1  . ondansetron (ZOFRAN) 4 MG tablet Take 1 tablet (4 mg total) by mouth every 6 (six) hours as needed for nausea or vomiting. 40 tablet 0  . pantoprazole (PROTONIX) 40 MG tablet TAKE 1 TABLET BY MOUTH DAILY AT 12 NOON. 90 tablet 3  . PAZEO 0.7 % SOLN INSTILL 1 DROP INTO BOTH EYES SINGLE DOSE  99  . predniSONE (DELTASONE) 10 MG tablet Take 1 tablet (10 mg total) by mouth daily with breakfast. 100 tablet 0  . SYMBICORT 80-4.5 MCG/ACT inhaler Inhale 2 puffs into the lungs daily as needed (shortness of breath).   3  . tiZANidine (ZANAFLEX) 4 MG tablet TAKE 1 TABLET (4  MG TOTAL) BY MOUTH 3 (THREE) TIMES DAILY AS NEEDED FOR MUSCLE SPASMS. 90 tablet 1   No current facility-administered medications on file prior to visit.   Allergies  Allergen Reactions  . Doxycycline Other (See Comments)    Unsteady gait  . Amoxicillin     Unknown per pt  . Aspirin Nausea And Vomiting  . Azithromycin     REACTION: pt states "ZPak doesn't work"  . Caffeine Nausea And Vomiting  . Ciprofloxacin Nausea And Vomiting  . Codeine Nausea And Vomiting  . Sulfamethizole     Unknown per pt  . Tramadol Hcl     REACTION: pt states "spaced-out"    No results found for this or any previous visit (from the past 2160 hour(s)).  Objective: General: Patient is awake, alert, and oriented x 3 and in no acute distress.  Integument: Skin is warm, dry and supple bilateral. Nails are tender, long, thickened and  dystrophic with subungual debris, consistent with onychomycosis, 1-5 bilateral. There is multiple small raised purple lesions at the medial aspect of the left foot with superimposed scaly skin that is purritic in nature with no acute signs of infection. No open lesions or preulcerative lesions present bilateral. Remaining integument unremarkable.  Vasculature:  Dorsalis Pedis pulse 1/4 bilateral. Posterior  Tibial pulse  1/4 bilateral.  Capillary fill time <3 sec 1-5 bilateral. Scant hair growth to the level of the digits. Temperature gradient within normal limits. No varicosities present bilateral. No edema present bilateral.   Neurology: The patient has intact sensation measured with a 5.07/10g Semmes Weinstein Monofilament at all pedal sites bilateral . Vibratory sensation intact bilateral with tuning fork. No Babinski sign present bilateral.   Musculoskeletal:Mild asymptomatic bunion pedal deformities noted bilateral.+ pain with palpation to right lateral achilles insertion with no gap or dell. Muscular strength 5/5 in all lower extremity muscular groups bilateral without pain on range of motion . No tenderness with calf compression bilateral.  Assessment and Plan: Problem List Items Addressed This Visit      Endocrine   Diabetes mellitus without complication ([place])    Other Visit Diagnoses    Dermatophytosis of nail    -  Primary    Relevant Medications    clotrimazole (LOTRIMIN) 1 % external solution    clotrimazole (LOTRIMIN) 1 % cream    Foot pain, bilateral        Tendonitis, Achilles, right        Relevant Medications    triamcinolone acetonide (KENALOG) 10 MG/ML injection 10 mg    Tinea        Relevant Medications    clotrimazole (LOTRIMIN) 1 % external solution    clotrimazole (LOTRIMIN) 1 % cream       -Examined patient. -Discussed and educated patient on diabetic foot care, especially with  regards to the vascular, neurological and musculoskeletal systems.  -Stressed the importance of good glycemic control and the detriment of not  controlling glucose levels in relation to the foot. -Mechanically debrided all nails 1-5 bilateral using sterile nail nipper and filed with dremel without incident  -Rx Clotrimazole solution and cream for possible tinea, advised patient if not improved in 1 month will refer to dermatologist, Dr. Nevada Crane for evaluation for any other  recommendations -After oral consent and aseptic prep, injected a mixture containing 1 ml of 2%  plain lidocaine, 1 ml 0.5% plain marcaine, 0.5 ml of kenalog 10 and 0.5 ml of dexamethasone phosphate into Right lateral achilles at insertion without complication.  Post-injection care discussed with patient.  -Heel lifts dispensed -Answered all patient questions -Patient to return in 3 months for at risk foot care -Patient advised to call the office if any problems or questions arise in the meantime.  Landis Martins, DPM

## 2015-10-25 ENCOUNTER — Other Ambulatory Visit: Payer: Self-pay | Admitting: Family Medicine

## 2015-10-25 ENCOUNTER — Other Ambulatory Visit: Payer: Self-pay | Admitting: Cardiology

## 2015-10-28 DIAGNOSIS — J309 Allergic rhinitis, unspecified: Secondary | ICD-10-CM | POA: Diagnosis not present

## 2015-10-28 DIAGNOSIS — K219 Gastro-esophageal reflux disease without esophagitis: Secondary | ICD-10-CM | POA: Diagnosis not present

## 2015-10-28 DIAGNOSIS — J4540 Moderate persistent asthma, uncomplicated: Secondary | ICD-10-CM | POA: Diagnosis not present

## 2015-11-07 ENCOUNTER — Other Ambulatory Visit: Payer: Self-pay | Admitting: Family Medicine

## 2015-11-07 MED ORDER — GLUCOSE BLOOD VI STRP: 1.0000 | ORAL_STRIP | Freq: Every day | Status: DC

## 2015-11-07 NOTE — Telephone Encounter (Signed)
Pt request refill  accu check test strips.   [facility]

## 2015-11-07 NOTE — Telephone Encounter (Signed)
Refill sent to pharmacy.   

## 2015-11-08 ENCOUNTER — Other Ambulatory Visit: Payer: Self-pay | Admitting: Family Medicine

## 2015-11-08 MED ORDER — GLUCOSE BLOOD VI STRP: 1.0000 | ORAL_STRIP | Freq: Every day | Status: DC

## 2015-11-10 ENCOUNTER — Other Ambulatory Visit: Payer: Self-pay | Admitting: Cardiology

## 2015-11-14 ENCOUNTER — Ambulatory Visit (INDEPENDENT_AMBULATORY_CARE_PROVIDER_SITE_OTHER): Payer: Medicare Other | Admitting: Family Medicine

## 2015-11-14 ENCOUNTER — Encounter: Payer: Self-pay | Admitting: Family Medicine

## 2015-11-14 VITALS — BP 138/88 | Temp 98.5°F | Ht 60.0 in | Wt 219.0 lb

## 2015-11-14 DIAGNOSIS — I2510 Atherosclerotic heart disease of native coronary artery without angina pectoris: Secondary | ICD-10-CM

## 2015-11-14 DIAGNOSIS — J019 Acute sinusitis, unspecified: Secondary | ICD-10-CM

## 2015-11-14 MED ORDER — HYDROCODONE-HOMATROPINE 5-1.5 MG/5ML PO SYRP: 5.0000 mL | ORAL_SOLUTION | ORAL | Status: DC | PRN

## 2015-11-14 MED ORDER — LEVOFLOXACIN 500 MG PO TABS: 500.0000 mg | ORAL_TABLET | Freq: Every day | ORAL | Status: AC

## 2015-11-14 NOTE — Progress Notes (Signed)
Pre visit review using our clinic review tool, if applicable. No additional management support is needed unless otherwise documented below in the visit note.

## 2015-11-15 ENCOUNTER — Encounter: Payer: Self-pay | Admitting: Family Medicine

## 2015-11-15 NOTE — Progress Notes (Signed)
   Subjective:    Patient ID: [name], female    DOB: [birth year], 75 y.o.   MRN: [id number]  HPI Here for 5 days of sinus pressure, PND,and coughing up green sputum. On Mucinex.    Review of Systems  Constitutional: Negative.   HENT: Positive for congestion, postnasal drip and sinus pressure. Negative for sore throat.   Eyes: Negative.   Respiratory: Positive for cough. Negative for chest tightness, shortness of breath and wheezing.   Cardiovascular: Negative.        Objective:   Physical Exam  Constitutional: She appears well-developed and well-nourished.  HENT:  Right Ear: External ear normal.  Left Ear: External ear normal.  Nose: Nose normal.  Mouth/Throat: Oropharynx is clear and moist.  Eyes: Conjunctivae are normal.  Neck: No thyromegaly present.  Cardiovascular: Normal rate, regular rhythm, normal heart sounds and intact distal pulses.   Pulmonary/Chest: Effort normal and breath sounds normal. No respiratory distress. She has no wheezes. She has no rales.  Lymphadenopathy:    She has no cervical adenopathy.          Assessment & Plan:  Sinusitis, treat with Levaquin. [name], MD

## 2015-11-16 DIAGNOSIS — M5106 Intervertebral disc disorders with myelopathy, lumbar region: Secondary | ICD-10-CM | POA: Diagnosis not present

## 2015-11-23 ENCOUNTER — Other Ambulatory Visit: Payer: Self-pay | Admitting: Family Medicine

## 2015-11-26 ENCOUNTER — Other Ambulatory Visit: Payer: Self-pay | Admitting: Family Medicine

## 2015-11-28 ENCOUNTER — Other Ambulatory Visit: Payer: Self-pay | Admitting: Family Medicine

## 2015-11-28 NOTE — Telephone Encounter (Signed)
Pt is requesting a refill. Last filled on 08/11/15, last OV 11/14/15. Ok to refill?

## 2015-11-30 ENCOUNTER — Telehealth: Payer: Self-pay | Admitting: Family Medicine

## 2015-11-30 ENCOUNTER — Encounter: Payer: Self-pay | Admitting: Family Medicine

## 2015-11-30 DIAGNOSIS — E119 Type 2 diabetes mellitus without complications: Secondary | ICD-10-CM | POA: Diagnosis not present

## 2015-11-30 LAB — HM DIABETES EYE EXAM

## 2015-11-30 NOTE — Telephone Encounter (Signed)
[facility] request [facility] for Accu-chek aviva test strips, fastclix lancets and control solution.

## 2015-12-02 MED ORDER — GLUCOSE BLOOD VI STRP: ORAL_STRIP | 1 refills | Status: DC

## 2015-12-02 MED ORDER — ACCU-CHEK AVIVA VI SOLN: 1 refills | Status: DC

## 2015-12-02 MED ORDER — ACCU-CHEK FASTCLIX LANCETS MISC
1 refills | Status: DC
Start: 2015-12-02 — End: 2020-07-14

## 2015-12-02 NOTE — Telephone Encounter (Signed)
I sent all 3 scripts requested e[facility]scribe to [facility].

## 2015-12-05 ENCOUNTER — Telehealth: Payer: Self-pay

## 2015-12-05 NOTE — Telephone Encounter (Signed)
Prior Authorization filed for hydrOXYzine (ATARAX/VISTARIL) 25 MG tablet  Key: [id number]

## 2015-12-21 NOTE — Telephone Encounter (Signed)
PA for Hydroxyzine has been approved - case number [id number], approved on 12/06/15

## 2015-12-23 ENCOUNTER — Other Ambulatory Visit: Payer: Self-pay

## 2016-01-30 ENCOUNTER — Ambulatory Visit: Payer: Medicare Other | Admitting: Sports Medicine

## 2016-01-31 ENCOUNTER — Encounter: Payer: Self-pay | Admitting: Sports Medicine

## 2016-01-31 ENCOUNTER — Ambulatory Visit (INDEPENDENT_AMBULATORY_CARE_PROVIDER_SITE_OTHER): Payer: Medicare Other | Admitting: Sports Medicine

## 2016-01-31 DIAGNOSIS — M79671 Pain in right foot: Secondary | ICD-10-CM | POA: Diagnosis not present

## 2016-01-31 DIAGNOSIS — B351 Tinea unguium: Secondary | ICD-10-CM | POA: Diagnosis not present

## 2016-01-31 DIAGNOSIS — M79672 Pain in left foot: Secondary | ICD-10-CM

## 2016-01-31 DIAGNOSIS — E119 Type 2 diabetes mellitus without complications: Secondary | ICD-10-CM | POA: Diagnosis not present

## 2016-01-31 DIAGNOSIS — B359 Dermatophytosis, unspecified: Secondary | ICD-10-CM

## 2016-01-31 DIAGNOSIS — M7661 Achilles tendinitis, right leg: Secondary | ICD-10-CM

## 2016-01-31 NOTE — Progress Notes (Signed)
Patient ID: [name], female   DOB: 11/15/1940, 75 y.o.   MRN: [id number]  Subjective: [name] is a 75 y.o. female patient with history of diabetes who returns to office today complaining of long, painful nails  while ambulating in shoes; unable to trim. Patient states that her skin is doing better with cream to left foot. Reports that her right heel pain is better. Patient states that the glucose reading this morning was not recorded today but has been "good". Patient denies any new changes in medication or any other new problems. Patient denies any new cramping, numbness, burning or tingling in the legs.  Patient Active Problem List   Diagnosis Date Noted  . Left sided colitis ([place]) 05/13/2015  . Cough 10/24/2013  . CAP (community acquired pneumonia) 10/07/2013  . Feeling grief 08/21/2013  . Ejection fraction   . Diarrhea 08/04/2013  . Back pain, chronic 02/06/2012  . Hyperlipidemia 12/07/2011  . Hematuria 11/27/2011  . Syncope and collapse 11/25/2011  . Ulcerative colitis ([place]) 11/25/2011  . Chronic diastolic CHF (congestive heart failure) ([place]) 11/25/2011  . Lower extremity edema 11/25/2011  . Nonischemic cardiomyopathy ([place]) 04/17/2011  . CAD (coronary artery disease)   . Bronchiectasis ([place])   . Essential hypertension   . PERSONAL HX COLONIC POLYPS 02/08/2010  . VITAMIN D DEFICIENCY 12/03/2009  . BRONCHITIS, ACUTE 02/28/2009  . NAUSEA AND VOMITING 12/06/2008  . Depression 08/10/2008  . PULMONARY NODULE 07/15/2008  . NEOPLASM, KIDNEY 03/16/2008  . MICROSCOPIC HEMATURIA 03/16/2008  . STRESS INCONTINENCE 03/16/2008  . COLONIC POLYPS 07/19/2007  . Diabetes mellitus without complication ([place]) 15/72/6203  . OBESITY 07/19/2007  . ANEMIA 07/19/2007  . Asthma 07/19/2007  . DIVERTICULOSIS OF COLON 07/19/2007  . ANXIETY 03/19/2007  . GERD 03/19/2007  . DEGENERATIVE JOINT DISEASE 03/19/2007  . FIBROMYALGIA 03/19/2007  . ALLERGY 03/19/2007   Current Outpatient  Prescriptions on File Prior to Visit  Medication Sig Dispense Refill  . ACCU-CHEK FASTCLIX LANCETS MISC Test once per day and diagnosis code is E 11.9 102 each 1  . acetaminophen (TYLENOL) 500 MG tablet Take 500 mg by mouth every 6 (six) hours as needed.    Marland Kitchen albuterol (PROVENTIL) (2.5 MG/3ML) 0.083% nebulizer solution Take 3 mLs (2.5 mg total) by nebulization every 4 (four) hours as needed for wheezing or shortness of breath. 75 mL 11  . albuterol (VENTOLIN HFA) 108 (90 BASE) MCG/ACT inhaler Inhale 2 puffs into the lungs every 4 (four) hours as needed for wheezing or shortness of breath. 18 each 11  . ALPRAZolam (XANAX) 0.5 MG tablet TAKE 1 TABLET BY MOUTH 3 TIMES A DAY 90 tablet 5  . amLODipine (NORVASC) 10 MG tablet TAKE 1 TABLET BY MOUTH DAILY. 90 tablet 1  . atenolol (TENORMIN) 50 MG tablet TAKE 1 TABLET BY MOUTH DAILY. 90 tablet 1  . atorvastatin (LIPITOR) 10 MG tablet TAKE 1 TABLET BY MOUTH DAILY. 90 tablet 1  . betamethasone valerate (VALISONE) 0.1 % cream Apply topically 2 (two) times daily. 30 g 2  . Blood Glucose Calibration (ACCU-CHEK AVIVA) SOLN Perform test as needed 1 each 1  . Budesonide (UCERIS) 9 MG TB24 Take 1 tablet by mouth every morning. (Patient not taking: Reported on 11/14/2015) 30 tablet 0  . Cholecalciferol (VITAMIN D-3) 5000 UNITS TABS Take 5,000 Units by mouth daily.      . clopidogrel (PLAVIX) 75 MG tablet TAKE 1 TABLET (75 MG TOTAL) BY MOUTH DAILY. 90 tablet 3  . clotrimazole (LOTRIMIN) 1 % cream  Apply 1 application topically 2 (two) times daily. 60 g 3  . clotrimazole (LOTRIMIN) 1 % external solution Apply 1 application topically 2 (two) times daily. In between toes 60 mL 5  . diclofenac sodium (VOLTAREN) 1 % GEL Apply 2 g topically 4 (four) times daily.    . diphenoxylate-atropine (LOMOTIL) 2.5-0.025 MG tablet Take 1 tab 3 times daily , take 1 every morning , then as needed. 90 tablet 0  . fluticasone (FLONASE) 50 MCG/ACT nasal spray PLACE 2 SPRAYS IN EACH NOSTRIL  AT BEDTIME 16 g 6  . furosemide (LASIX) 40 MG tablet TAKE 1 TABLET (40 MG TOTAL) BY MOUTH 3 (THREE) TIMES DAILY. (Patient taking differently: TAKE 1 TABLET (40 MG TOTAL) BY MOUTH 2-3 times per day) 270 tablet 2  . glimepiride (AMARYL) 1 MG tablet TAKE 1 TABLET BY MOUTH DAILY BEFORE BREAKFAST. 90 tablet 3  . glucose blood (ACCU-CHEK AVIVA) test strip Test once per day and diagnosis code is E 11.9 100 each 1  . HYDROcodone-acetaminophen (NORCO/VICODIN) 5-325 MG tablet Take 2 tablets by mouth every 6 (six) hours as needed for severe pain. 120 tablet 0  . HYDROcodone-homatropine (HYDROMET) 5-1.5 MG/5ML syrup Take 5 mLs by mouth every 4 (four) hours as needed. 240 mL 0  . hydrocortisone (PROCTOSOL HC) 2.5 % rectal cream Place rectally 3 (three) times daily. (Patient not taking: Reported on 11/14/2015) 28.35 g 3  . hydrOXYzine (ATARAX/VISTARIL) 25 MG tablet TAKE 1 TABLET EVERY 4 HOURS AS NEEDED FOR ITCHING 50 tablet 2  . isosorbide mononitrate (IMDUR) 60 MG 24 hr tablet TAKE 1 TABLET BY MOUTH DAILY. 90 tablet 1  . KLOR-CON M20 20 MEQ tablet TAKE 1 TABLET BY MOUTH 2 TIMES DAILY. 180 tablet 3  . levocetirizine (XYZAL) 5 MG tablet TAKE 1 TABLET (5 MG TOTAL) BY MOUTH EVERY EVENING. 90 tablet 3  . LIDODERM 5 % APPLY 1 PATCH TO SKIN FOR 12 HOURS THEN REMOVE AND DISCARD PATCH WITHIN 72 HOURS OR AS DIRECTED 30 patch 2  . Loperamide-Simethicone (IMODIUM MULTI-SYMPTOM RELIEF) 2-125 MG TABS Take 1 tablet by mouth as needed. Reported on 11/14/2015    . losartan (COZAAR) 50 MG tablet TAKE 1 TABLET BY MOUTH DAILY. 90 tablet 3  . magnesium oxide (MAG-OX) 400 (241.3 Mg) MG tablet TAKE 1 TABLET (400 MG TOTAL) BY MOUTH DAILY. 90 tablet 3  . mesalamine (CANASA) 1000 MG suppository Place 1 suppository (1,000 mg total) rectally at bedtime. (Patient not taking: Reported on 11/14/2015) 30 suppository 3  . Mesalamine (DELZICOL) 400 MG CPDR DR capsule Take 4 capsules (1,600 mg total) by mouth 3 (three) times daily. 360 capsule 5  .  metFORMIN (GLUCOPHAGE) 1000 MG tablet Take 1 tablet (1,000 mg total) by mouth 2 (two) times daily with a meal. 60 tablet 11  . montelukast (SINGULAIR) 10 MG tablet TAKE 1 TABLET AT BEDTIME 90 tablet 6  . nabumetone (RELAFEN) 750 MG tablet TAKE 1 TABLET BY MOUTH TWICE A DAY WITH A MEAL 180 tablet 3  . Naftifine HCl (NAFTIN) 2 % CREA Apply topically 2 (two) times daily.    Marland Kitchen neomycin-polymyxin-hydrocortisone (CORTISPORIN) otic solution Use 1 drop in affected ear three times daily as needed (Patient not taking: Reported on 11/14/2015) 10 mL 0  . nitroGLYCERIN (NITROSTAT) 0.4 MG SL tablet Place 1 tablet (0.4 mg total) under the tongue every 5 (five) minutes as needed for chest pain. (Patient not taking: Reported on 11/14/2015) 90 tablet 1  . ondansetron (ZOFRAN) 4 MG tablet Take  1 tablet (4 mg total) by mouth every 6 (six) hours as needed for nausea or vomiting. (Patient not taking: Reported on 11/14/2015) 40 tablet 0  . pantoprazole (PROTONIX) 40 MG tablet TAKE 1 TABLET BY MOUTH DAILY AT 12 NOON. 90 tablet 3  . PAZEO 0.7 % SOLN INSTILL 1 DROP INTO BOTH EYES SINGLE DOSE  99  . predniSONE (DELTASONE) 10 MG tablet Take 1 tablet (10 mg total) by mouth daily with breakfast. (Patient not taking: Reported on 11/14/2015) 100 tablet 0  . SYMBICORT 80-4.5 MCG/ACT inhaler Inhale 2 puffs into the lungs daily as needed (shortness of breath).   3  . tiZANidine (ZANAFLEX) 4 MG tablet TAKE 1 TABLET (4 MG TOTAL) BY MOUTH 3 (THREE) TIMES DAILY AS NEEDED FOR MUSCLE SPASMS. 90 tablet 11   Current Facility-Administered Medications on File Prior to Visit  Medication Dose Route Frequency Provider Last Rate Last Dose  . triamcinolone acetonide (KENALOG) 10 MG/ML injection 10 mg  10 mg Other Once Landis Martins, DPM       Allergies  Allergen Reactions  . Doxycycline Other (See Comments)    Unsteady gait  . Amoxicillin     Unknown per pt  . Aspirin Nausea And Vomiting  . Azithromycin     REACTION: pt states "ZPak doesn't  work"  . Caffeine Nausea And Vomiting  . Ciprofloxacin Nausea And Vomiting  . Codeine Nausea And Vomiting  . Sulfamethizole     Unknown per pt  . Tramadol Hcl     REACTION: pt states "spaced-out"    Recent Results (from the past 2160 hour(s))  HM DIABETES EYE EXAM     Status: None   Collection Time: 11/30/15  2:49 PM  Result Value Ref Range   HM Diabetic Eye Exam  No Retinopathy    Objective: General: Patient is awake, alert, and oriented x 3 and in no acute distress.  Integument: Skin is warm, dry and supple bilateral. Nails are tender, long, thickened and dystrophic with subungual debris, consistent with onychomycosis, 1-5 bilateral. There is improved superimposed scaly skin L>R foot. No open lesions or preulcerative lesions present bilateral. Remaining integument unremarkable.  Vasculature:  Dorsalis Pedis pulse 1/4 bilateral. Posterior Tibial pulse  1/4 bilateral.  Capillary fill time <3 sec 1-5 bilateral. Scant hair growth to the level of the digits.Temperature gradient within normal limits. No varicosities present bilateral. No edema present bilateral.   Neurology: The patient has intact sensation measured with a 5.07/10g Semmes Weinstein Monofilament at all pedal sites bilateral . Vibratory sensation intact bilateral with tuning fork. No Babinski sign present bilateral.   Musculoskeletal:Mild asymptomatic bunion pedal deformities noted bilateral. No pain with palpation to right lateral achilles insertion with no gap or dell. Muscular strength 5/5 in all lower extremity muscular groups bilateral without pain on range of motion . No tenderness with calf compression bilateral.  Assessment and Plan: Problem List Items Addressed This Visit      Endocrine   Diabetes mellitus without complication ([place])    Other Visit Diagnoses    Dermatophytosis of nail    -  Primary   Foot pain, bilateral       Tendonitis, Achilles, right       Improved   Tinea          -Examined  patient. -Discussed and educated patient on diabetic foot care, especially with  regards to the vascular, neurological and musculoskeletal systems.  -Stressed the importance of good glycemic control and the detriment of not  controlling glucose levels in relation to the foot. -Mechanically debrided all nails 1-5 bilateral using sterile nail nipper and filed with dremel without incident  -Continue with Clotrimazole solution and cream .  -Continue with Heel lifts to prevent recurrent achilles pain on right  -Answered all patient questions -Patient to return in 3 months for at risk foot care -Patient advised to call the office if any problems or questions arise in the meantime.  Landis Martins, DPM

## 2016-02-05 ENCOUNTER — Other Ambulatory Visit: Payer: Self-pay | Admitting: Cardiology

## 2016-02-08 DIAGNOSIS — M25512 Pain in left shoulder: Secondary | ICD-10-CM | POA: Diagnosis not present

## 2016-02-27 ENCOUNTER — Other Ambulatory Visit: Payer: Self-pay | Admitting: Cardiology

## 2016-02-28 ENCOUNTER — Other Ambulatory Visit: Payer: Self-pay | Admitting: Cardiology

## 2016-03-15 DIAGNOSIS — R3914 Feeling of incomplete bladder emptying: Secondary | ICD-10-CM | POA: Diagnosis not present

## 2016-03-19 ENCOUNTER — Ambulatory Visit (INDEPENDENT_AMBULATORY_CARE_PROVIDER_SITE_OTHER): Payer: Medicare Other | Admitting: Cardiology

## 2016-03-19 ENCOUNTER — Encounter: Payer: Self-pay | Admitting: Cardiology

## 2016-03-19 VITALS — BP 126/70 | HR 72 | Ht 60.0 in | Wt 212.0 lb

## 2016-03-19 DIAGNOSIS — I10 Essential (primary) hypertension: Secondary | ICD-10-CM

## 2016-03-19 DIAGNOSIS — I2510 Atherosclerotic heart disease of native coronary artery without angina pectoris: Secondary | ICD-10-CM | POA: Diagnosis not present

## 2016-03-19 DIAGNOSIS — I2583 Coronary atherosclerosis due to lipid rich plaque: Secondary | ICD-10-CM | POA: Diagnosis not present

## 2016-03-19 DIAGNOSIS — E7800 Pure hypercholesterolemia, unspecified: Secondary | ICD-10-CM | POA: Diagnosis not present

## 2016-03-19 NOTE — Progress Notes (Signed)
Cardiology Office Note   Date:  03/19/2016   ID:  [name], DOB February 13, 1941, MRN [id number]  PCP:  [name], MD  Cardiologist:   Candee Furbish, MD       History of Present Illness: [name] is a 75 y.o. female former patient of Dr. Ron Parker who in 2002 had an BMS Express 3.5 x 20 mm stent placed in the proximal LAD with 3 subsequent catheterization showing patent stent here for follow-up.  Physical history of not being able to tolerate aspirin. Stomach pain.  She remains on Plavix long-term.  Left arm focal pain sometimes, right as well. Likely noncardiac. She also discussed occasional right-sided jaw pain at rest. Nonexertional. This seems to be relieved when she stretches surgical back. Lives with son and his wife. Son encourages NTG use.   Has ulcerative colitis. Had trouble at the beach. Back, disk trouble. Discouraged by body aches.  She had a mechanical fall a few weeks ago. This happened when dreaming. Saw ortho.   Past Medical History:  Diagnosis Date  . Adenomatous colon polyp 02/2006  . Allergy, unspecified not elsewhere classified   . Anemia   . Anxiety   . Arthritis   . Bronchiectasis   . CAD (coronary artery disease)    BMS to the LAD, 2002; cath 12/07/11 patent LAD stent and mild nonobstructive disease, EF 65%; Medical management  . CHF (congestive heart failure) (HCC)    Diastolic  . Diabetes mellitus   . Diverticulosis of colon   . DJD (degenerative joint disease)   . Ejection fraction   . Fibromyalgia   . GERD (gastroesophageal reflux disease)   . Hypercholesterolemia   . Hypertension   . Microscopic hematuria   . Neoplasm of kidney   . Obesity   . Other diseases of lung, not elsewhere classified   . Pinched vertebral nerve   . Pulmonary nodule    Negative PET in 2010  . Stress incontinence, female   . Syncope   . Ulcerative colitis, left sided ([place]) 2008   Hx of  . UTI (lower urinary tract infection)   . Vitamin D deficiency disease    . Volume overload     Past Surgical History:  Procedure Laterality Date  . ABDOMINAL HYSTERECTOMY    . CARDIAC CATHETERIZATION  11/2011  . CHOLECYSTECTOMY    . COLONOSCOPY W/ BIOPSIES  11-12-12   per Dr. Fuller Plan, diverticulosis only, repeat in 5 yrs  . CORONARY STENT PLACEMENT  2002   BMS to the LAD  . ESOPHAGOGASTRODUODENOSCOPY  12/2008  . HAND SURGERY  01/2006   Right Hand Surgery by Dr. Amedeo Plenty  . LEFT HEART CATHETERIZATION WITH CORONARY ANGIOGRAM N/A 12/07/2011   Procedure: LEFT HEART CATHETERIZATION WITH CORONARY ANGIOGRAM;  Surgeon: Peter M Martinique, MD;  Location: [facility] CATH LAB;  Service: Cardiovascular;  Laterality: N/A;     Current Outpatient Prescriptions  Medication Sig Dispense Refill  . ACCU-CHEK FASTCLIX LANCETS MISC Test once per day and diagnosis code is E 11.9 102 each 1  . acetaminophen (TYLENOL) 500 MG tablet Take 500 mg by mouth every 6 (six) hours as needed.    Marland Kitchen albuterol (PROVENTIL) (2.5 MG/3ML) 0.083% nebulizer solution Take 3 mLs (2.5 mg total) by nebulization every 4 (four) hours as needed for wheezing or shortness of breath. 75 mL 11  . albuterol (VENTOLIN HFA) 108 (90 BASE) MCG/ACT inhaler Inhale 2 puffs into the lungs every 4 (four) hours as needed for wheezing or shortness of  breath. 18 each 11  . ALPRAZolam (XANAX) 0.5 MG tablet TAKE 1 TABLET BY MOUTH 3 TIMES A DAY 90 tablet 5  . amLODipine (NORVASC) 10 MG tablet TAKE 1 TABLET BY MOUTH DAILY. 90 tablet 2  . atenolol (TENORMIN) 50 MG tablet TAKE 1 TABLET BY MOUTH DAILY. 90 tablet 1  . atorvastatin (LIPITOR) 10 MG tablet TAKE 1 TABLET BY MOUTH DAILY. 90 tablet 1  . betamethasone valerate (VALISONE) 0.1 % cream Apply topically 2 (two) times daily. 30 g 2  . Blood Glucose Calibration (ACCU-CHEK AVIVA) SOLN Perform test as needed 1 each 1  . Budesonide (UCERIS) 9 MG TB24 Take 1 tablet by mouth every morning. 30 tablet 0  . Cholecalciferol (VITAMIN D-3) 5000 UNITS TABS Take 5,000 Units by mouth daily.      .  clopidogrel (PLAVIX) 75 MG tablet TAKE 1 TABLET (75 MG TOTAL) BY MOUTH DAILY. 90 tablet 3  . clotrimazole (LOTRIMIN) 1 % external solution Apply 1 application topically 2 (two) times daily. In between toes 60 mL 5  . diclofenac sodium (VOLTAREN) 1 % GEL Apply 2 g topically 4 (four) times daily.    . diphenoxylate-atropine (LOMOTIL) 2.5-0.025 MG tablet Take 1 tab 3 times daily , take 1 every morning , then as needed. 90 tablet 0  . fluticasone (FLONASE) 50 MCG/ACT nasal spray PLACE 2 SPRAYS IN EACH NOSTRIL AT BEDTIME 16 g 6  . furosemide (LASIX) 40 MG tablet TAKE 1 TABLET (40 MG TOTAL) BY MOUTH 3 (THREE) TIMES DAILY. (Patient taking differently: TAKE 1 TABLET (40 MG TOTAL) BY MOUTH 2-3 times per day) 270 tablet 2  . glimepiride (AMARYL) 1 MG tablet TAKE 1 TABLET BY MOUTH DAILY BEFORE BREAKFAST. 90 tablet 3  . glucose blood (ACCU-CHEK AVIVA) test strip Test once per day and diagnosis code is E 11.9 100 each 1  . HYDROcodone-acetaminophen (NORCO/VICODIN) 5-325 MG tablet Take 2 tablets by mouth every 6 (six) hours as needed for severe pain. 120 tablet 0  . HYDROcodone-homatropine (HYDROMET) 5-1.5 MG/5ML syrup Take 5 mLs by mouth every 4 (four) hours as needed. 240 mL 0  . hydrocortisone (PROCTOSOL HC) 2.5 % rectal cream Place rectally 3 (three) times daily. 28.35 g 3  . hydrOXYzine (ATARAX/VISTARIL) 25 MG tablet TAKE 1 TABLET EVERY 4 HOURS AS NEEDED FOR ITCHING 50 tablet 2  . isosorbide mononitrate (IMDUR) 60 MG 24 hr tablet TAKE 1 TABLET BY MOUTH DAILY. 90 tablet 1  . KLOR-CON M20 20 MEQ tablet TAKE 1 TABLET BY MOUTH 2 TIMES DAILY. 180 tablet 1  . levocetirizine (XYZAL) 5 MG tablet TAKE 1 TABLET (5 MG TOTAL) BY MOUTH EVERY EVENING. 90 tablet 3  . LIDODERM 5 % APPLY 1 PATCH TO SKIN FOR 12 HOURS THEN REMOVE AND DISCARD PATCH WITHIN 72 HOURS OR AS DIRECTED 30 patch 2  . Loperamide-Simethicone (IMODIUM MULTI-SYMPTOM RELIEF) 2-125 MG TABS Take 1 tablet by mouth as needed. Reported on 11/14/2015    . losartan  (COZAAR) 50 MG tablet TAKE 1 TABLET BY MOUTH DAILY. 90 tablet 3  . magnesium oxide (MAG-OX) 400 (241.3 Mg) MG tablet TAKE 1 TABLET (400 MG TOTAL) BY MOUTH DAILY. 90 tablet 3  . mesalamine (CANASA) 1000 MG suppository Place 1 suppository (1,000 mg total) rectally at bedtime. 30 suppository 3  . Mesalamine (DELZICOL) 400 MG CPDR DR capsule Take 4 capsules (1,600 mg total) by mouth 3 (three) times daily. 360 capsule 5  . metFORMIN (GLUCOPHAGE) 1000 MG tablet Take 1 tablet (1,000 mg  total) by mouth 2 (two) times daily with a meal. 60 tablet 11  . montelukast (SINGULAIR) 10 MG tablet TAKE 1 TABLET AT BEDTIME 90 tablet 6  . nabumetone (RELAFEN) 750 MG tablet TAKE 1 TABLET BY MOUTH TWICE A DAY WITH A MEAL 180 tablet 3  . Naftifine HCl (NAFTIN) 2 % CREA Apply topically 2 (two) times daily.    Marland Kitchen neomycin-polymyxin-hydrocortisone (CORTISPORIN) otic solution Use 1 drop in affected ear three times daily as needed 10 mL 0  . nitroGLYCERIN (NITROSTAT) 0.4 MG SL tablet Place 1 tablet (0.4 mg total) under the tongue every 5 (five) minutes as needed for chest pain. 90 tablet 1  . ondansetron (ZOFRAN) 4 MG tablet Take 1 tablet (4 mg total) by mouth every 6 (six) hours as needed for nausea or vomiting. 40 tablet 0  . pantoprazole (PROTONIX) 40 MG tablet TAKE 1 TABLET BY MOUTH DAILY AT 12 NOON. 90 tablet 3  . PAZEO 0.7 % SOLN INSTILL 1 DROP INTO BOTH EYES SINGLE DOSE  99  . predniSONE (DELTASONE) 10 MG tablet Take 1 tablet (10 mg total) by mouth daily with breakfast. 100 tablet 0  . SYMBICORT 80-4.5 MCG/ACT inhaler Inhale 2 puffs into the lungs daily as needed (shortness of breath).   3  . tiZANidine (ZANAFLEX) 4 MG tablet TAKE 1 TABLET (4 MG TOTAL) BY MOUTH 3 (THREE) TIMES DAILY AS NEEDED FOR MUSCLE SPASMS. 90 tablet 11   Current Facility-Administered Medications  Medication Dose Route Frequency Provider Last Rate Last Dose  . triamcinolone acetonide (KENALOG) 10 MG/ML injection 10 mg  10 mg Other Once Landis Martins, DPM        Allergies:   Doxycycline; Amoxicillin; Aspirin; Azithromycin; Caffeine; Ciprofloxacin; Codeine; Sulfamethizole; and Tramadol hcl    Social History:  The patient  reports that she quit smoking about 42 years ago. Her smoking use included Cigarettes. She has a 30.00 pack-year smoking history. She has never used smokeless tobacco. She reports that she drinks alcohol. She reports that she does not use drugs.   Family History:  [name] family history includes Breast cancer in her daughter; Diabetes in her sister; Heart attack in her father; Hypertension in her sister; Parkinsonism in her mother; Pneumonia in her father; Sarcoidosis in her brother.    ROS:  Please see the history of present illness.   Otherwise, review of systems are positive for none.   All other systems are reviewed and negative.    PHYSICAL EXAM: VS:  BP 126/70   Pulse 72   Ht 5' (1.524 m)   Wt 212 lb (96.2 kg)   LMP  (LMP Unknown)   BMI 41.40 kg/m  , BMI Body mass index is 41.4 kg/m. GEN: Well nourished, well developed, in no acute distress  HEENT: normal  Neck: no JVD, carotid bruits, or masses Cardiac: RRR; no murmurs, rubs, or gallops, trace lower extremity bilateral edema  Respiratory:  clear to auscultation bilaterally, normal work of breathing GI: soft, nontender, nondistended, + BS, overweight MS: no deformity or atrophy  Skin: warm and dry, no rash Neuro:  Strength and sensation are intact Psych: euthymic mood, full affect   EKG:  EKG today 03/19/16-sinus rhythm, 72, vertical axis, poor R-wave progression personally viewed-no significant change from prior 09/29/15-normal sinus rhythm, 70, biatrial enlargement, no other abnormalities. Personally viewed  Recent Labs: 05/12/2015: Creatinine, Ser 0.50    Lipid Panel    Component Value Date/Time   CHOL 132 03/15/2015 1014   TRIG  84.0 03/15/2015 1014   HDL 51.50 03/15/2015 1014   CHOLHDL 3 03/15/2015 1014   VLDL 16.8 03/15/2015  1014   LDLCALC 64 03/15/2015 1014      Wt Readings from Last 3 Encounters:  03/19/16 212 lb (96.2 kg)  11/14/15 219 lb (99.3 kg)  09/29/15 216 lb (98 kg)      Other studies Reviewed: Additional studies/ records that were reviewed today include: Prior office notes, lab work, catheterization reports. Review of the above records demonstrates: As above   ASSESSMENT AND PLAN:  Coronary artery disease -2002 proximal LAD bare-metal stent, express stent, Dr. Olevia Perches. -3 subsequent catheterizations reassuring. Last catheterization 2013. -No symptoms currently. Doing well. Secondary prevention. -Moderate disease treated medically in other vessels. Aspirin intolerance -Stomach issues if taking aspirin -Continue Plavix lifelong.  Obesity -Encourage weight loss.This is been trouble at times with her back pain. She is to like walking in the neighborhood but this is hard for her to do now.  Ulcerative colitis -Dr. Fuller Plan - PPI  Chronic diastolic heart failure -Occasional diuretic. Fluid restriction, salt restriction. Normal renal function. In part secondary to obesity.  Hyperlipidemia -Atorvastatin, LDL less than 70. No changes.  Essential hypertension -Multidrug regimen reviewed. Good blood pressure control.  Fall  - Sounds multifactorial, mechanical. Occurred while dreaming. She is being checked out by Dr. Sarajane Jews.    Disposition:   FU with APP in 6 months Skains in 12 months  Signed, Candee Furbish, MD  03/19/2016 10:18 AM    [facility] [address], [place], [place]  [address] Phone: [phone]; Fax: [phone]

## 2016-03-19 NOTE — Patient Instructions (Signed)
Medication Instructions:  The current medical regimen is effective;  continue present plan and medications.  Follow-Up: Follow up in 6 months with Katy Thompson, PA.  You will receive a letter in the mail 2 months before you are due.  Please call us when you receive this letter to schedule your follow up appointment.  Follow up in 1 year with Dr. Skains.  You will receive a letter in the mail 2 months before you are due.  Please call us when you receive this letter to schedule your follow up appointment.  If you need a refill on your cardiac medications before your next appointment, please call your pharmacy.  Thank you for choosing [facility]!!     

## 2016-03-20 DIAGNOSIS — S43492A Other sprain of left shoulder joint, initial encounter: Secondary | ICD-10-CM | POA: Diagnosis not present

## 2016-03-20 DIAGNOSIS — S20212A Contusion of left front wall of thorax, initial encounter: Secondary | ICD-10-CM | POA: Diagnosis not present

## 2016-03-29 ENCOUNTER — Other Ambulatory Visit: Payer: Self-pay | Admitting: Cardiology

## 2016-04-03 ENCOUNTER — Encounter: Payer: Self-pay | Admitting: Family Medicine

## 2016-04-03 DIAGNOSIS — N6321 Unspecified lump in the left breast, upper outer quadrant: Secondary | ICD-10-CM | POA: Diagnosis not present

## 2016-04-03 DIAGNOSIS — N6313 Unspecified lump in the right breast, lower outer quadrant: Secondary | ICD-10-CM | POA: Diagnosis not present

## 2016-04-06 ENCOUNTER — Telehealth: Payer: Self-pay | Admitting: Gastroenterology

## 2016-04-06 ENCOUNTER — Other Ambulatory Visit: Payer: Self-pay | Admitting: Gastroenterology

## 2016-04-06 MED ORDER — MESALAMINE 400 MG PO CPDR: 1600.0000 mg | DELAYED_RELEASE_CAPSULE | Freq: Three times a day (TID) | ORAL | 1 refills | Status: DC

## 2016-04-06 NOTE — Telephone Encounter (Signed)
Informed patient she is over due for an appt but we can schedule that now and send her refill of Delzicol to her pharmacy. Patient verbalized understanding and pt scheduled for 05/24/16 at 10:15am. rx sent to the pharmacy.

## 2016-04-07 ENCOUNTER — Other Ambulatory Visit: Payer: Self-pay | Admitting: Cardiology

## 2016-04-16 ENCOUNTER — Other Ambulatory Visit: Payer: Self-pay | Admitting: Family Medicine

## 2016-04-16 NOTE — Telephone Encounter (Signed)
Call in #[phone] with [phone] 

## 2016-04-18 ENCOUNTER — Telehealth: Payer: Self-pay | Admitting: Family Medicine

## 2016-04-18 MED ORDER — LEVOFLOXACIN 500 MG PO TABS: 500.0000 mg | ORAL_TABLET | Freq: Every day | ORAL | 0 refills | Status: DC

## 2016-04-18 NOTE — Telephone Encounter (Signed)
I sent script e-scribe to [facility] and spoke with pt.

## 2016-04-18 NOTE — Telephone Encounter (Signed)
Call in Levaquin 500 mg daily for 10 days

## 2016-04-18 NOTE — Telephone Encounter (Signed)
Pt would like dr to call in a rx for Leviquin  Pt has head congestion/asthma and gets into bronchitis sometimes. Pt states she gets this and Dr Sarajane Jews is aware  [facility] [address]

## 2016-04-25 ENCOUNTER — Other Ambulatory Visit: Payer: Self-pay | Admitting: Family Medicine

## 2016-04-25 ENCOUNTER — Other Ambulatory Visit: Payer: Self-pay | Admitting: Emergency Medicine

## 2016-04-25 MED ORDER — ALBUTEROL SULFATE (2.5 MG/3ML) 0.083% IN NEBU: 2.5000 mg | INHALATION_SOLUTION | RESPIRATORY_TRACT | 11 refills | Status: DC | PRN

## 2016-05-04 ENCOUNTER — Other Ambulatory Visit: Payer: Self-pay | Admitting: Cardiology

## 2016-05-04 ENCOUNTER — Ambulatory Visit (INDEPENDENT_AMBULATORY_CARE_PROVIDER_SITE_OTHER): Payer: Medicare Other | Admitting: Adult Health

## 2016-05-04 ENCOUNTER — Encounter: Payer: Self-pay | Admitting: Adult Health

## 2016-05-04 ENCOUNTER — Telehealth: Payer: Self-pay | Admitting: Family Medicine

## 2016-05-04 VITALS — BP 124/68 | Temp 98.4°F | Ht 60.0 in | Wt 212.2 lb

## 2016-05-04 DIAGNOSIS — J45901 Unspecified asthma with (acute) exacerbation: Secondary | ICD-10-CM | POA: Diagnosis not present

## 2016-05-04 MED ORDER — IPRATROPIUM-ALBUTEROL 0.5-2.5 (3) MG/3ML IN SOLN: 3.0000 mL | Freq: Once | RESPIRATORY_TRACT | Status: AC

## 2016-05-04 MED ORDER — METHYLPREDNISOLONE ACETATE 80 MG/ML IJ SUSP
80.0000 mg | Freq: Once | INTRAMUSCULAR | Status: AC
  Administered 2016-05-04: 80 mg via INTRAMUSCULAR

## 2016-05-04 MED ORDER — BENZONATATE 100 MG PO CAPS: 100.0000 mg | ORAL_CAPSULE | Freq: Two times a day (BID) | ORAL | 1 refills | Status: DC | PRN

## 2016-05-04 NOTE — Telephone Encounter (Signed)
[facility] - Client [facility] Patient Name: [name] DOB: 1940-10-07 Initial Comment Caller states has asthma, been taking breathing tx and inhaler, not helping, also has a bad cough Nurse Assessment Nurse: Andria Frames, RN, Aeriel Date/Time (Eastern Time): 05/04/2016 12:33:07 PM Confirm and document reason for call. If symptomatic, describe symptoms. ---Caller states has asthma, been taking breathing tx and inhaler, not helping, also has a bad cough. Does the patient have any new or worsening symptoms? ---Yes Will a triage be completed? ---Yes Related visit to physician within the last 2 weeks? ---No Does the PT have any chronic conditions? (i.e. diabetes, asthma, etc.) ---Yes List chronic conditions. ---asthma, chf Is this a behavioral health or substance abuse call? ---No Guidelines Guideline Title Affirmed Question Affirmed Notes Asthma Attack Asthma medicine (nebulizer or inhaler) is needed more frequently than q 4 hours to keep you comfortable Final Disposition User Go to ED Now Hensel, RN, Aeriel Comments Caller states, she is not struggling to breathe. Caller states, she just needs a shot. Nurse upgrade related to believing pt having a hard time breathing. She is also having to use her inhaler more than every 4 hours. Caller is requesting call back for an appt. Nurse contacted office to inform them of ed refusal. Referrals GO TO FACILITY REFUSED Disagree/Comply: Disagree Disagree/Comply Reason: Disagree with instructions

## 2016-05-04 NOTE — Telephone Encounter (Signed)
Pt scheduled to see Dorothyann Peng, NP today.

## 2016-05-04 NOTE — Progress Notes (Signed)
Subjective:    Patient ID: [name], female    DOB: March 10, 1941, 76 y.o.   MRN: [id number]  HPI  76 year old female who  has a past medical history of Adenomatous colon polyp (02/2006); Allergy, unspecified not elsewhere classified; Anemia; Anxiety; Arthritis; Bronchiectasis; CAD (coronary artery disease); CHF (congestive heart failure) ([place]); Diabetes mellitus; Diverticulosis of colon; DJD (degenerative joint disease); Ejection fraction; Fibromyalgia; GERD (gastroesophageal reflux disease); Hypercholesterolemia; Hypertension; Microscopic hematuria; Neoplasm of kidney; Obesity; Other diseases of lung, not elsewhere classified; Pinched vertebral nerve; Pulmonary nodule; Stress incontinence, female; Syncope; Ulcerative colitis, left sided ([place]) (2008); UTI (lower urinary tract infection); Vitamin D deficiency disease; and Volume overload. She presents to the office today with her granddaughter for possible asthma exacerbation. She reports that her symptoms started about 2-3 days ago and consist of constant dry cough, shortness of breath, and wheezing. She states " this feels like my typical asthma." She has been using her inhalers and nebulizer which have helped but not resolved her symptoms.   She denies any fevers, n/v/d.   Review of Systems  See HPI   Past Medical History:  Diagnosis Date  . Adenomatous colon polyp 02/2006  . Allergy, unspecified not elsewhere classified   . Anemia   . Anxiety   . Arthritis   . Bronchiectasis   . CAD (coronary artery disease)    BMS to the LAD, 2002; cath 12/07/11 patent LAD stent and mild nonobstructive disease, EF 65%; Medical management  . CHF (congestive heart failure) (H[facility])    Diastolic  . Diabetes mellitus   . Diverticulosis of colon   . DJD (degenerative joint disease)   . Ejection fraction   . Fibromyalgia   . GERD (gastroesophageal reflux disease)   . Hypercholesterolemia   . Hypertension   . Microscopic hematuria   . Neoplasm of  kidney   . Obesity   . Other diseases of lung, not elsewhere classified   . Pinched vertebral nerve   . Pulmonary nodule    Negative PET in 2010  . Stress incontinence, female   . Syncope   . Ulcerative colitis, left sided ([place]) 2008   Hx of  . UTI (lower urinary tract infection)   . Vitamin D deficiency disease   . Volume overload     Social History   Social History  . Marital status: Divorced    Spouse name: N/A  . Number of children: 3  . Years of education: N/A   Occupational History  . Retired Retired   Social History Main Topics  . Smoking status: Former Smoker    Packs/day: 2.00    Years: 15.00    Types: Cigarettes    Quit date: 04/30/1973  . Smokeless tobacco: Never Used     Comment: ex-smoker: smoked for 10-15 years up to 2ppd, quit 1975.  Marland Kitchen Alcohol use 0.0 oz/week     Comment: occ  . Drug use: No  . Sexual activity: No   Other Topics Concern  . Not on file   Social History Narrative  . No narrative on file    Past Surgical History:  Procedure Laterality Date  . ABDOMINAL HYSTERECTOMY    . CARDIAC CATHETERIZATION  11/2011  . CHOLECYSTECTOMY    . COLONOSCOPY W/ BIOPSIES  11-12-12   per Dr. Fuller Plan, diverticulosis only, repeat in 5 yrs  . CORONARY STENT PLACEMENT  2002   BMS to the LAD  . ESOPHAGOGASTRODUODENOSCOPY  12/2008  . HAND SURGERY  01/2006  Right Hand Surgery by Dr. Amedeo Plenty  . LEFT HEART CATHETERIZATION WITH CORONARY ANGIOGRAM N/A 12/07/2011   Procedure: LEFT HEART CATHETERIZATION WITH CORONARY ANGIOGRAM;  Surgeon: Peter M Martinique, MD;  Location: [facility] CATH LAB;  Service: Cardiovascular;  Laterality: N/A;    Family History  Problem Relation Age of Onset  . Pneumonia Father   . Heart attack Father   . Parkinsonism Mother   . Sarcoidosis Brother   . Hypertension Sister   . Diabetes Sister   . Colon cancer Neg Hx   . Breast cancer Daughter     Allergies  Allergen Reactions  . Doxycycline Other (See Comments)    Unsteady gait  . Amoxicillin      Unknown per pt  . Aspirin Nausea And Vomiting  . Azithromycin     REACTION: pt states "ZPak doesn't work"  . Caffeine Nausea And Vomiting  . Ciprofloxacin Nausea And Vomiting  . Codeine Nausea And Vomiting  . Sulfamethizole     Unknown per pt  . Tramadol Hcl     REACTION: pt states "spaced-out"    Current Outpatient Prescriptions on File Prior to Visit  Medication Sig Dispense Refill  . ACCU-CHEK FASTCLIX LANCETS MISC Test once per day and diagnosis code is E 11.9 102 each 1  . acetaminophen (TYLENOL) 500 MG tablet Take 500 mg by mouth every 6 (six) hours as needed.    Marland Kitchen albuterol (PROVENTIL) (2.5 MG/3ML) 0.083% nebulizer solution Take 3 mLs (2.5 mg total) by nebulization every 4 (four) hours as needed for wheezing or shortness of breath. 75 mL 11  . albuterol (VENTOLIN HFA) 108 (90 BASE) MCG/ACT inhaler Inhale 2 puffs into the lungs every 4 (four) hours as needed for wheezing or shortness of breath. 18 each 11  . ALPRAZolam (XANAX) 0.5 MG tablet TAKE 1 TABLET BY MOUTH 3 TIMES A DAY 90 tablet 5  . amLODipine (NORVASC) 10 MG tablet TAKE 1 TABLET BY MOUTH DAILY. 90 tablet 2  . atenolol (TENORMIN) 50 MG tablet TAKE 1 TABLET BY MOUTH DAILY. 90 tablet 1  . atorvastatin (LIPITOR) 10 MG tablet TAKE 1 TABLET BY MOUTH DAILY. 90 tablet 3  . betamethasone valerate (VALISONE) 0.1 % cream Apply topically 2 (two) times daily. 30 g 2  . Blood Glucose Calibration (ACCU-CHEK AVIVA) SOLN Perform test as needed 1 each 1  . Budesonide (UCERIS) 9 MG TB24 Take 1 tablet by mouth every morning. 30 tablet 0  . Cholecalciferol (VITAMIN D-3) 5000 UNITS TABS Take 5,000 Units by mouth daily.      . clopidogrel (PLAVIX) 75 MG tablet TAKE 1 TABLET (75 MG TOTAL) BY MOUTH DAILY. 90 tablet 3  . clotrimazole (LOTRIMIN) 1 % external solution Apply 1 application topically 2 (two) times daily. In between toes 60 mL 5  . diclofenac sodium (VOLTAREN) 1 % GEL Apply 2 g topically 4 (four) times daily.    .  diphenoxylate-atropine (LOMOTIL) 2.5-0.025 MG tablet Take 1 tab 3 times daily , take 1 every morning , then as needed. 90 tablet 0  . fluticasone (FLONASE) 50 MCG/ACT nasal spray PLACE 2 SPRAYS IN EACH NOSTRIL AT BEDTIME 16 g 6  . furosemide (LASIX) 40 MG tablet TAKE 1 TABLET (40 MG TOTAL) BY MOUTH 3 (THREE) TIMES DAILY. 270 tablet 3  . glimepiride (AMARYL) 1 MG tablet TAKE 1 TABLET BY MOUTH DAILY BEFORE BREAKFAST. 90 tablet 3  . glucose blood (ACCU-CHEK AVIVA) test strip Test once per day and diagnosis code is E 11.9 100  each 1  . HYDROcodone-acetaminophen (NORCO/VICODIN) 5-325 MG tablet Take 2 tablets by mouth every 6 (six) hours as needed for severe pain. 120 tablet 0  . HYDROcodone-homatropine (HYDROMET) 5-1.5 MG/5ML syrup Take 5 mLs by mouth every 4 (four) hours as needed. 240 mL 0  . hydrocortisone (PROCTOSOL HC) 2.5 % rectal cream Place rectally 3 (three) times daily. 28.35 g 3  . hydrOXYzine (ATARAX/VISTARIL) 25 MG tablet TAKE 1 TABLET EVERY 4 HOURS AS NEEDED FOR ITCHING 50 tablet 2  . isosorbide mononitrate (IMDUR) 60 MG 24 hr tablet TAKE 1 TABLET BY MOUTH DAILY. 90 tablet 1  . KLOR-CON M20 20 MEQ tablet TAKE 1 TABLET BY MOUTH 2 TIMES DAILY. 180 tablet 1  . levocetirizine (XYZAL) 5 MG tablet TAKE 1 TABLET (5 MG TOTAL) BY MOUTH EVERY EVENING. 90 tablet 3  . levofloxacin (LEVAQUIN) 500 MG tablet Take 1 tablet (500 mg total) by mouth daily. 10 tablet 0  . LIDODERM 5 % APPLY 1 PATCH TO SKIN FOR 12 HOURS THEN REMOVE AND DISCARD PATCH WITHIN 72 HOURS OR AS DIRECTED 30 patch 2  . Loperamide-Simethicone (IMODIUM MULTI-SYMPTOM RELIEF) 2-125 MG TABS Take 1 tablet by mouth as needed. Reported on 11/14/2015    . losartan (COZAAR) 50 MG tablet TAKE 1 TABLET BY MOUTH DAILY. 90 tablet 3  . magnesium oxide (MAG-OX) 400 (241.3 Mg) MG tablet TAKE 1 TABLET (400 MG TOTAL) BY MOUTH DAILY. 90 tablet 3  . mesalamine (CANASA) 1000 MG suppository Place 1 suppository (1,000 mg total) rectally at bedtime. 30  suppository 3  . Mesalamine (DELZICOL) 400 MG CPDR DR capsule Take 4 capsules (1,600 mg total) by mouth 3 (three) times daily. 360 capsule 1  . metFORMIN (GLUCOPHAGE) 1000 MG tablet Take 1 tablet (1,000 mg total) by mouth 2 (two) times daily with a meal. 60 tablet 11  . montelukast (SINGULAIR) 10 MG tablet TAKE 1 TABLET AT BEDTIME 90 tablet 1  . nabumetone (RELAFEN) 750 MG tablet TAKE 1 TABLET BY MOUTH TWICE A DAY WITH A MEAL 180 tablet 3  . Naftifine HCl (NAFTIN) 2 % CREA Apply topically 2 (two) times daily.    Marland Kitchen neomycin-polymyxin-hydrocortisone (CORTISPORIN) otic solution Use 1 drop in affected ear three times daily as needed 10 mL 0  . nitroGLYCERIN (NITROSTAT) 0.4 MG SL tablet Place 1 tablet (0.4 mg total) under the tongue every 5 (five) minutes as needed for chest pain. 90 tablet 1  . ondansetron (ZOFRAN) 4 MG tablet Take 1 tablet (4 mg total) by mouth every 6 (six) hours as needed for nausea or vomiting. 40 tablet 0  . pantoprazole (PROTONIX) 40 MG tablet TAKE 1 TABLET BY MOUTH DAILY AT 12 NOON. 90 tablet 3  . PAZEO 0.7 % SOLN INSTILL 1 DROP INTO BOTH EYES SINGLE DOSE  99  . predniSONE (DELTASONE) 10 MG tablet Take 1 tablet (10 mg total) by mouth daily with breakfast. 100 tablet 0  . SYMBICORT 80-4.5 MCG/ACT inhaler Inhale 2 puffs into the lungs daily as needed (shortness of breath).   3  . tiZANidine (ZANAFLEX) 4 MG tablet TAKE 1 TABLET (4 MG TOTAL) BY MOUTH 3 (THREE) TIMES DAILY AS NEEDED FOR MUSCLE SPASMS. 90 tablet 11   Current Facility-Administered Medications on File Prior to Visit  Medication Dose Route Frequency Provider Last Rate Last Dose  . triamcinolone acetonide (KENALOG) 10 MG/ML injection 10 mg  10 mg Other Once [employer], DPM        BP 124/68   Temp 98.4  F (36.9 C) (Oral)   Ht 5' (1.524 m)   Wt 212 lb 3.2 oz (96.3 kg)   LMP  (LMP Unknown)   BMI 41.44 kg/m       Objective:   Physical Exam  Constitutional: She is oriented to person, place, and time. She  appears well-developed and well-nourished. No distress.  Cardiovascular: Normal rate, regular rhythm, normal heart sounds and intact distal pulses.  Exam reveals no gallop and no friction rub.   No murmur heard. Pulmonary/Chest: Effort normal. She has decreased breath sounds in the right middle field, the right lower field and the left upper field. She has wheezes in the right upper field, the right middle field, the right lower field, the left upper field, the left middle field and the left lower field.  Abdominal: Normal appearance.  Neurological: She is alert and oriented to person, place, and time.  Skin: Skin is warm and dry. No rash noted. She is not diaphoretic. No erythema. No pallor.  Psychiatric: She has a normal mood and affect. Her behavior is normal. Judgment and thought content normal.  Nursing note and vitals reviewed.     Assessment & Plan:  1. Moderate asthma with acute exacerbation, unspecified whether persistent - ipratropium-albuterol (DUONEB) 0.5-2.5 (3) MG/3ML nebulizer solution 3 mL; Take 3 mLs by nebulization once. - methylPREDNISolone acetate (DEPO-MEDROL) injection 80 mg; Inject 1 mL (80 mg total) into the muscle once. - benzonatate (TESSALON) 100 MG capsule; Take 1 capsule (100 mg total) by mouth 2 (two) times daily as needed for cough.  Dispense: 20 capsule; Refill: 1 - patient reported being able to breath easier after duo neb. On exam, there was less wheezing.  - Advised to continue with breathing treatments and inhalers at home.  - Follow up if no improvement   Dorothyann Peng, NP

## 2016-05-04 NOTE — Telephone Encounter (Signed)
Medication Detail    Disp Refills Start End   isosorbide mononitrate (IMDUR) 60 MG 24 hr tablet 90 tablet 1 02/27/2016    Sig: TAKE 1 TABLET BY MOUTH DAILY.   E-Prescribing Status: Receipt confirmed by pharmacy (02/27/2016 3:24 PM EDT)   Pharmacy   [id number] [place], [place]

## 2016-05-08 ENCOUNTER — Ambulatory Visit: Payer: Medicare Other | Admitting: Sports Medicine

## 2016-05-14 ENCOUNTER — Ambulatory Visit (INDEPENDENT_AMBULATORY_CARE_PROVIDER_SITE_OTHER): Payer: Medicare Other | Admitting: Family Medicine

## 2016-05-14 ENCOUNTER — Encounter: Payer: Self-pay | Admitting: Family Medicine

## 2016-05-14 VITALS — BP 136/78 | Temp 98.7°F | Ht 60.0 in | Wt 216.0 lb

## 2016-05-14 DIAGNOSIS — J209 Acute bronchitis, unspecified: Secondary | ICD-10-CM | POA: Diagnosis not present

## 2016-05-14 MED ORDER — CLARITHROMYCIN 500 MG PO TABS: 500.0000 mg | ORAL_TABLET | Freq: Two times a day (BID) | ORAL | 0 refills | Status: DC

## 2016-05-14 MED ORDER — HYDROXYZINE HCL 25 MG PO TABS: ORAL_TABLET | ORAL | 5 refills | Status: DC

## 2016-05-14 MED ORDER — METHYLPREDNISOLONE 4 MG PO TBPK: ORAL_TABLET | ORAL | 0 refills | Status: DC

## 2016-05-14 MED ORDER — HYDROCODONE-HOMATROPINE 5-1.5 MG/5ML PO SYRP: 5.0000 mL | ORAL_SOLUTION | ORAL | 0 refills | Status: DC | PRN

## 2016-05-14 NOTE — Progress Notes (Signed)
Pre visit review using our clinic review tool, if applicable. No additional management support is needed unless otherwise documented below in the visit note.

## 2016-05-14 NOTE — Progress Notes (Signed)
   Subjective:    Patient ID: [name], female    DOB: [birth year], 76 y.o.   MRN: [id number]  HPI Here for 3 weeks of coughing which is usually dry but can produce yellow sputum. No fever. She took a course of Levaquin which helped somewhat. She is using her nebulizer 4 times a day and also using her inhaler.    Review of Systems  Constitutional: Negative.   HENT: Negative.   Eyes: Negative.   Respiratory: Positive for cough, chest tightness, shortness of breath and wheezing.   Cardiovascular: Negative.        Objective:   Physical Exam  Constitutional: She is oriented to person, place, and time. She appears well-developed and well-nourished. No distress.  HENT:  Right Ear: External ear normal.  Left Ear: External ear normal.  Nose: Nose normal.  Mouth/Throat: Oropharynx is clear and moist.  Eyes: Conjunctivae are normal.  Neck: No thyromegaly present.  Cardiovascular: Normal rate, regular rhythm, normal heart sounds and intact distal pulses.   Pulmonary/Chest: Effort normal and breath sounds normal. No respiratory distress. She has no wheezes. She has no rales. She exhibits no tenderness.  Lymphadenopathy:    She has no cervical adenopathy.  Neurological: She is alert and oriented to person, place, and time.          Assessment & Plan:  Partially treated bronchitis. Given Biaxin and a Medrol dose pack. Alysia Penna, MD

## 2016-05-21 ENCOUNTER — Ambulatory Visit
Admission: RE | Admit: 2016-05-21 | Discharge: 2016-05-21 | Disposition: A | Discharge status: Home / Self Care | Payer: Medicare Other | Source: Ambulatory Visit | Attending: Allergy | Admitting: Allergy

## 2016-05-21 ENCOUNTER — Other Ambulatory Visit: Payer: Self-pay | Admitting: Allergy

## 2016-05-21 ENCOUNTER — Other Ambulatory Visit: Payer: Self-pay | Admitting: Family Medicine

## 2016-05-21 DIAGNOSIS — J4540 Moderate persistent asthma, uncomplicated: Secondary | ICD-10-CM

## 2016-05-21 DIAGNOSIS — K219 Gastro-esophageal reflux disease without esophagitis: Secondary | ICD-10-CM | POA: Diagnosis not present

## 2016-05-21 DIAGNOSIS — R05 Cough: Secondary | ICD-10-CM | POA: Diagnosis not present

## 2016-05-21 DIAGNOSIS — J309 Allergic rhinitis, unspecified: Secondary | ICD-10-CM | POA: Diagnosis not present

## 2016-05-22 DIAGNOSIS — J309 Allergic rhinitis, unspecified: Secondary | ICD-10-CM | POA: Diagnosis not present

## 2016-05-22 DIAGNOSIS — J4541 Moderate persistent asthma with (acute) exacerbation: Secondary | ICD-10-CM | POA: Diagnosis not present

## 2016-05-22 DIAGNOSIS — K219 Gastro-esophageal reflux disease without esophagitis: Secondary | ICD-10-CM | POA: Diagnosis not present

## 2016-05-24 ENCOUNTER — Ambulatory Visit (INDEPENDENT_AMBULATORY_CARE_PROVIDER_SITE_OTHER): Payer: Medicare Other | Admitting: Gastroenterology

## 2016-05-24 ENCOUNTER — Encounter: Payer: Self-pay | Admitting: Gastroenterology

## 2016-05-24 ENCOUNTER — Telehealth: Payer: Self-pay | Admitting: Family Medicine

## 2016-05-24 VITALS — BP 130/80 | HR 76 | Ht 61.0 in | Wt 214.1 lb

## 2016-05-24 DIAGNOSIS — K219 Gastro-esophageal reflux disease without esophagitis: Secondary | ICD-10-CM | POA: Diagnosis not present

## 2016-05-24 DIAGNOSIS — K5130 Ulcerative (chronic) rectosigmoiditis without complications: Secondary | ICD-10-CM | POA: Diagnosis not present

## 2016-05-24 MED ORDER — MESALAMINE 400 MG PO CPDR: 1600.0000 mg | DELAYED_RELEASE_CAPSULE | Freq: Three times a day (TID) | ORAL | 11 refills | Status: DC

## 2016-05-24 MED ORDER — PANTOPRAZOLE SODIUM 40 MG PO TBEC: DELAYED_RELEASE_TABLET | ORAL | 3 refills | Status: DC

## 2016-05-24 NOTE — Patient Instructions (Signed)
We have sent the following medications to your pharmacy for you to pick up at your convenience: Delzicol at current dose and pantoprazole twice daily.   Thank you for choosing me and [facility].  Pricilla Riffle. Dagoberto Ligas., MD., Marval Regal

## 2016-05-24 NOTE — Progress Notes (Signed)
History of Present Illness: This is a 76 year old female with a history of left-sided ulcerative colitis. She relates good control of symptoms. She has a history of intermittent diarrhea that is well controlled with when necessary use of Imodium. She denies any bloody diarrhea, mucus or abdominal pain. She reports occasional regurgitation and her asthma has not been as well controlled.   Allergies  Allergen Reactions  . Doxycycline Other (See Comments)    Unsteady gait  . Amoxicillin     Unknown per pt  . Aspirin Nausea And Vomiting  . Azithromycin     REACTION: pt states "ZPak doesn't work"  . Caffeine Nausea And Vomiting  . Ciprofloxacin Nausea And Vomiting  . Codeine Nausea And Vomiting  . Sulfamethizole     Unknown per pt  . Tramadol Hcl     REACTION: pt states "spaced-out"   Outpatient Medications Prior to Visit  Medication Sig Dispense Refill  . ACCU-CHEK FASTCLIX LANCETS MISC Test once per day and diagnosis code is E 11.9 102 each 1  . acetaminophen (TYLENOL) 500 MG tablet Take 500 mg by mouth every 6 (six) hours as needed.    Marland Kitchen albuterol (PROVENTIL) (2.5 MG/3ML) 0.083% nebulizer solution Take 3 mLs (2.5 mg total) by nebulization every 4 (four) hours as needed for wheezing or shortness of breath. 75 mL 11  . albuterol (VENTOLIN HFA) 108 (90 BASE) MCG/ACT inhaler Inhale 2 puffs into the lungs every 4 (four) hours as needed for wheezing or shortness of breath. 18 each 11  . ALPRAZolam (XANAX) 0.5 MG tablet TAKE 1 TABLET BY MOUTH 3 TIMES A DAY 90 tablet 5  . amLODipine (NORVASC) 10 MG tablet TAKE 1 TABLET BY MOUTH DAILY. 90 tablet 2  . atenolol (TENORMIN) 50 MG tablet TAKE 1 TABLET BY MOUTH DAILY. 90 tablet 1  . atorvastatin (LIPITOR) 10 MG tablet TAKE 1 TABLET BY MOUTH DAILY. 90 tablet 3  . betamethasone valerate (VALISONE) 0.1 % cream Apply topically 2 (two) times daily. 30 g 2  . Blood Glucose Calibration (ACCU-CHEK AVIVA) SOLN Perform test as needed 1 each 1  . Budesonide  (UCERIS) 9 MG TB24 Take 1 tablet by mouth every morning. 30 tablet 0  . Cholecalciferol (VITAMIN D-3) 5000 UNITS TABS Take 5,000 Units by mouth daily.      . clarithromycin (BIAXIN) 500 MG tablet Take 1 tablet (500 mg total) by mouth 2 (two) times daily. 20 tablet 0  . clopidogrel (PLAVIX) 75 MG tablet TAKE 1 TABLET (75 MG TOTAL) BY MOUTH DAILY. 90 tablet 3  . clotrimazole (LOTRIMIN) 1 % external solution Apply 1 application topically 2 (two) times daily. In between toes 60 mL 5  . diclofenac sodium (VOLTAREN) 1 % GEL Apply 2 g topically 4 (four) times daily.    . diphenoxylate-atropine (LOMOTIL) 2.5-0.025 MG tablet Take 1 tab 3 times daily , take 1 every morning , then as needed. 90 tablet 0  . fluticasone (FLONASE) 50 MCG/ACT nasal spray PLACE 2 SPRAYS IN EACH NOSTRIL AT BEDTIME 16 g 6  . furosemide (LASIX) 40 MG tablet TAKE 1 TABLET (40 MG TOTAL) BY MOUTH 3 (THREE) TIMES DAILY. 270 tablet 3  . glimepiride (AMARYL) 1 MG tablet TAKE 1 TABLET BY MOUTH DAILY BEFORE BREAKFAST. 90 tablet 3  . glucose blood (ACCU-CHEK AVIVA) test strip Test once per day and diagnosis code is E 11.9 100 each 1  . HYDROcodone-acetaminophen (NORCO/VICODIN) 5-325 MG tablet Take 2 tablets by mouth every 6 (six) hours  as needed for severe pain. 120 tablet 0  . HYDROcodone-homatropine (HYDROMET) 5-1.5 MG/5ML syrup Take 5 mLs by mouth every 4 (four) hours as needed. 240 mL 0  . hydrocortisone (PROCTOSOL HC) 2.5 % rectal cream Place rectally 3 (three) times daily. 28.35 g 3  . hydrOXYzine (ATARAX/VISTARIL) 25 MG tablet TAKE 1 TABLET EVERY 4 HOURS AS NEEDED FOR ITCHING 50 tablet 5  . isosorbide mononitrate (IMDUR) 60 MG 24 hr tablet TAKE 1 TABLET BY MOUTH DAILY. 90 tablet 1  . KLOR-CON M20 20 MEQ tablet TAKE 1 TABLET BY MOUTH 2 TIMES DAILY. 180 tablet 1  . levocetirizine (XYZAL) 5 MG tablet TAKE 1 TABLET (5 MG TOTAL) BY MOUTH EVERY EVENING. 90 tablet 3  . LIDODERM 5 % APPLY 1 PATCH TO SKIN FOR 12 HOURS THEN REMOVE AND DISCARD  PATCH WITHIN 72 HOURS OR AS DIRECTED 30 patch 2  . Loperamide-Simethicone (IMODIUM MULTI-SYMPTOM RELIEF) 2-125 MG TABS Take 1 tablet by mouth as needed. Reported on 11/14/2015    . losartan (COZAAR) 50 MG tablet TAKE 1 TABLET BY MOUTH DAILY. 90 tablet 3  . magnesium oxide (MAG-OX) 400 (241.3 Mg) MG tablet TAKE 1 TABLET (400 MG TOTAL) BY MOUTH DAILY. 90 tablet 3  . mesalamine (CANASA) 1000 MG suppository Place 1 suppository (1,000 mg total) rectally at bedtime. 30 suppository 3  . Mesalamine (DELZICOL) 400 MG CPDR DR capsule Take 4 capsules (1,600 mg total) by mouth 3 (three) times daily. 360 capsule 1  . metFORMIN (GLUCOPHAGE) 1000 MG tablet Take 1 tablet (1,000 mg total) by mouth 2 (two) times daily with a meal. 60 tablet 11  . montelukast (SINGULAIR) 10 MG tablet TAKE 1 TABLET AT BEDTIME 90 tablet 1  . nabumetone (RELAFEN) 750 MG tablet TAKE 1 TABLET BY MOUTH TWICE A DAY WITH A MEAL 180 tablet 3  . Naftifine HCl (NAFTIN) 2 % CREA Apply topically 2 (two) times daily.    Marland Kitchen neomycin-polymyxin-hydrocortisone (CORTISPORIN) otic solution Use 1 drop in affected ear three times daily as needed 10 mL 0  . nitroGLYCERIN (NITROSTAT) 0.4 MG SL tablet Place 1 tablet (0.4 mg total) under the tongue every 5 (five) minutes as needed for chest pain. 90 tablet 1  . ondansetron (ZOFRAN) 4 MG tablet Take 1 tablet (4 mg total) by mouth every 6 (six) hours as needed for nausea or vomiting. 40 tablet 0  . pantoprazole (PROTONIX) 40 MG tablet TAKE 1 TABLET BY MOUTH DAILY AT 12 NOON. 90 tablet 3  . PAZEO 0.7 % SOLN INSTILL 1 DROP INTO BOTH EYES SINGLE DOSE  99  . predniSONE (DELTASONE) 10 MG tablet Take 1 tablet (10 mg total) by mouth daily with breakfast. 100 tablet 0  . SYMBICORT 80-4.5 MCG/ACT inhaler Inhale 2 puffs into the lungs daily as needed (shortness of breath).   3  . tiZANidine (ZANAFLEX) 4 MG tablet TAKE 1 TABLET (4 MG TOTAL) BY MOUTH 3 (THREE) TIMES DAILY AS NEEDED FOR MUSCLE SPASMS. 90 tablet 11  .  benzonatate (TESSALON) 100 MG capsule Take 1 capsule (100 mg total) by mouth 2 (two) times daily as needed for cough. 20 capsule 1  . methylPREDNISolone (MEDROL DOSEPAK) 4 MG TBPK tablet As directed 21 tablet 0   Facility-Administered Medications Prior to Visit  Medication Dose Route Frequency Provider Last Rate Last Dose  . ipratropium-albuterol (DUONEB) 0.5-2.5 (3) MG/3ML nebulizer solution 3 mL  3 mL Nebulization Once [employer], NP      . triamcinolone acetonide (KENALOG) 10 MG/ML  injection 10 mg  10 mg Other Once Landis Martins, DPM       Past Medical History:  Diagnosis Date  . Adenomatous colon polyp 02/2006  . Allergy, unspecified not elsewhere classified   . Anemia   . Anxiety   . Arthritis   . Bronchiectasis   . CAD (coronary artery disease)    BMS to the LAD, 2002; cath 12/07/11 patent LAD stent and mild nonobstructive disease, EF 65%; Medical management  . CHF (congestive heart failure) (HCC)    Diastolic  . Diabetes mellitus   . Diverticulosis of colon   . DJD (degenerative joint disease)   . Ejection fraction   . Fibromyalgia   . GERD (gastroesophageal reflux disease)   . Hypercholesterolemia   . Hypertension   . Microscopic hematuria   . Neoplasm of kidney   . Obesity   . Other diseases of lung, not elsewhere classified   . Pinched vertebral nerve   . Pulmonary nodule    Negative PET in 2010  . Stress incontinence, female   . Syncope   . Ulcerative colitis, left sided ([place]) 2008   Hx of  . UTI (lower urinary tract infection)   . Vitamin D deficiency disease   . Volume overload    Past Surgical History:  Procedure Laterality Date  . ABDOMINAL HYSTERECTOMY    . CARDIAC CATHETERIZATION  11/2011  . CHOLECYSTECTOMY    . COLONOSCOPY W/ BIOPSIES  11-12-12   per Dr. Fuller Plan, diverticulosis only, repeat in 5 yrs  . CORONARY STENT PLACEMENT  2002   BMS to the LAD  . ESOPHAGOGASTRODUODENOSCOPY  12/2008  . HAND SURGERY  01/2006   Right Hand Surgery by Dr. Amedeo Plenty    . LEFT HEART CATHETERIZATION WITH CORONARY ANGIOGRAM N/A 12/07/2011   Procedure: LEFT HEART CATHETERIZATION WITH CORONARY ANGIOGRAM;  Surgeon: Peter M Martinique, MD;  Location: [facility] CATH LAB;  Service: Cardiovascular;  Laterality: N/A;   Social History   Social History  . Marital status: Divorced    Spouse name: N/A  . Number of children: 3  . Years of education: N/A   Occupational History  . Retired Retired   Social History Main Topics  . Smoking status: Former Smoker    Packs/day: 2.00    Years: 15.00    Types: Cigarettes    Quit date: 04/30/1973  . Smokeless tobacco: Never Used     Comment: ex-smoker: smoked for 10-15 years up to 2ppd, quit 1975.  Marland Kitchen Alcohol use 0.0 oz/week     Comment: occ  . Drug use: No  . Sexual activity: No   Other Topics Concern  . None   Social History Narrative  . None   Family History  Problem Relation Age of Onset  . Pneumonia Father   . Heart attack Father   . Parkinsonism Mother   . Sarcoidosis Brother   . Hypertension Sister   . Diabetes Sister   . Breast cancer Daughter   . Colon cancer Neg Hx       Physical Exam: General: Well developed, well nourished, no acute distress Head: Normocephalic and atraumatic Eyes:  sclerae anicteric, EOMI Ears: Normal auditory acuity Mouth: No deformity or lesions Lungs: Clear throughout to auscultation Heart: Regular rate and rhythm; no murmurs, rubs or bruits Abdomen: Soft, non tender and non distended. No masses, hepatosplenomegaly or hernias noted. Normal Bowel sounds Musculoskeletal: Symmetrical with no gross deformities  Pulses:  Normal pulses noted Extremities: No clubbing, cyanosis, edema or deformities noted  Neurological: Alert oriented x 4, grossly nonfocal Psychological:  Alert and cooperative. Normal mood and affect  Assessment and Recommendations:  1. GERD. Occasional regurgitation. GERD could be contributing to suboptimal asthma control. Increase pantoprazole to 40 mg twice daily for  3 months and assess response. Intensify antireflux measures. If symptoms improve on twice-daily therapy she will maintain this long-term.

## 2016-05-24 NOTE — Telephone Encounter (Signed)
° ° °  Pt has been scheduled for her appt in February and is asking for a refill on the following med   glimepiride (AMARYL) 1 MG tablet    Pt said the reason she don't want to come in till the end of February is because she is on steroids for her asthma.

## 2016-05-24 NOTE — Assessment & Plan Note (Addendum)
Well controlled. Refill Delizcol 1.6 g tid for 1 year. Continue Imodium twice daily as needed. Pt states she has a complete physical exam with blood work scheduled with Dr. Sarajane Jews in the near future. REV in 1 year.

## 2016-05-25 ENCOUNTER — Telehealth: Payer: Self-pay | Admitting: Family Medicine

## 2016-05-25 MED ORDER — GLIMEPIRIDE 1 MG PO TABS: ORAL_TABLET | ORAL | 0 refills | Status: DC

## 2016-05-25 NOTE — Telephone Encounter (Signed)
Per Dr. Sarajane Jews okay to fill for a 90 day supply until patient is seen here in office and has labs done. [facility].

## 2016-05-26 ENCOUNTER — Emergency Department (HOSPITAL_COMMUNITY): Payer: Medicare Other

## 2016-05-26 ENCOUNTER — Encounter (HOSPITAL_COMMUNITY): Payer: Self-pay | Admitting: *Deleted

## 2016-05-26 ENCOUNTER — Emergency Department (HOSPITAL_COMMUNITY)
Admission: EM | Admit: 2016-05-26 | Discharge: 2016-05-26 | Disposition: A | Discharge status: Home / Self Care | Payer: Medicare Other | Attending: Emergency Medicine | Admitting: Emergency Medicine

## 2016-05-26 DIAGNOSIS — R05 Cough: Secondary | ICD-10-CM | POA: Insufficient documentation

## 2016-05-26 DIAGNOSIS — Z7984 Long term (current) use of oral hypoglycemic drugs: Secondary | ICD-10-CM | POA: Insufficient documentation

## 2016-05-26 DIAGNOSIS — R0602 Shortness of breath: Secondary | ICD-10-CM | POA: Diagnosis not present

## 2016-05-26 DIAGNOSIS — Z87891 Personal history of nicotine dependence: Secondary | ICD-10-CM | POA: Insufficient documentation

## 2016-05-26 DIAGNOSIS — I110 Hypertensive heart disease with heart failure: Secondary | ICD-10-CM | POA: Insufficient documentation

## 2016-05-26 DIAGNOSIS — I2510 Atherosclerotic heart disease of native coronary artery without angina pectoris: Secondary | ICD-10-CM | POA: Insufficient documentation

## 2016-05-26 DIAGNOSIS — R059 Cough, unspecified: Secondary | ICD-10-CM

## 2016-05-26 DIAGNOSIS — Z85528 Personal history of other malignant neoplasm of kidney: Secondary | ICD-10-CM | POA: Insufficient documentation

## 2016-05-26 DIAGNOSIS — E119 Type 2 diabetes mellitus without complications: Secondary | ICD-10-CM | POA: Insufficient documentation

## 2016-05-26 DIAGNOSIS — I5032 Chronic diastolic (congestive) heart failure: Secondary | ICD-10-CM | POA: Insufficient documentation

## 2016-05-26 LAB — BASIC METABOLIC PANEL
ANION GAP: 14 (ref 5–15)
BUN: 18 mg/dL (ref 6–20)
CHLORIDE: 100 mmol/L — AB (ref 101–111)
CO2: 20 mmol/L — ABNORMAL LOW (ref 22–32)
Calcium: 9.1 mg/dL (ref 8.9–10.3)
Creatinine, Ser: 0.71 mg/dL (ref 0.44–1.00)
GFR calc Af Amer: >60 mL/min (ref >60)
GLUCOSE: 339 mg/dL — AB (ref 65–99)
POTASSIUM: 3.7 mmol/L (ref 3.5–5.1)
Sodium: 134 mmol/L — ABNORMAL LOW (ref 135–145)

## 2016-05-26 LAB — CBC
HEMATOCRIT: 39.2 % (ref 36.0–46.0)
HEMOGLOBIN: 12.8 g/dL (ref 12.0–15.0)
MCH: 29.9 pg (ref 26.0–34.0)
MCHC: 32.7 g/dL (ref 30.0–36.0)
MCV: 91.6 fL (ref 78.0–100.0)
Platelets: 434 10*3/uL — ABNORMAL HIGH (ref 150–400)
RBC: 4.28 MIL/uL (ref 3.87–5.11)
RDW: 14.5 % (ref 11.5–15.5)
WBC: 19.3 10*3/uL — ABNORMAL HIGH (ref 4.0–10.5)

## 2016-05-26 LAB — I-STAT TROPONIN, ED: Troponin i, poc: 0 ng/mL (ref 0.00–0.08)

## 2016-05-26 NOTE — ED Triage Notes (Signed)
Pt stated sob since December.  She has been seen by her asthma MD, but she is worried it's her chf.  States increased LE edema.

## 2016-05-26 NOTE — ED Provider Notes (Signed)
[facility] DEPT Provider Note   CSN: [id number] Arrival date & time: 05/26/16  1521     History   Chief Complaint Chief Complaint  Patient presents with  . Shortness of Breath    HPI [name] is a 76 y.o. female. Her complaint is a cough for 3 weeks.  HPI: Patient states that she has been sick since "Christmas". She's been seen by her pulmonologist. She's been seen by primary care. She was given Flonase. She was told to take Mucinex. She is done sinus rinses. She still has some nasal congestion and minimal discharge. No sinus pressure. No fever. Dry nonproductive cough. She has tried Mucinex, [employer], and [employer].  No chest pain. Some clear nasal discharge. No leg pain or swelling. States she was "just worried by B by congestive heart failure since its lasted so long".  Compliant with all medications per her report   Past Medical History:  Diagnosis Date  . Adenomatous colon polyp 02/2006  . Allergy, unspecified not elsewhere classified   . Anemia   . Anxiety   . Arthritis   . Bronchiectasis   . CAD (coronary artery disease)    BMS to the LAD, 2002; cath 12/07/11 patent LAD stent and mild nonobstructive disease, EF 65%; Medical management  . CHF (congestive heart failure) (HCC)    Diastolic  . Diabetes mellitus   . Diverticulosis of colon   . DJD (degenerative joint disease)   . Ejection fraction   . Fibromyalgia   . GERD (gastroesophageal reflux disease)   . Hypercholesterolemia   . Hypertension   . Microscopic hematuria   . Neoplasm of kidney   . Obesity   . Other diseases of lung, not elsewhere classified   . Pinched vertebral nerve   . Pulmonary nodule    Negative PET in 2010  . Stress incontinence, female   . Syncope   . Ulcerative colitis, left sided ([place]) 2008   Hx of  . UTI (lower urinary tract infection)   . Vitamin D deficiency disease   . Volume overload     Patient Active Problem List   Diagnosis Date Noted  . Left sided colitis ([place])  05/13/2015  . Cough 10/24/2013  . CAP (community acquired pneumonia) 10/07/2013  . Feeling grief 08/21/2013  . Ejection fraction   . Diarrhea 08/04/2013  . Back pain, chronic 02/06/2012  . Hyperlipidemia 12/07/2011  . Hematuria 11/27/2011  . Syncope and collapse 11/25/2011  . Ulcerative colitis ([place]) 11/25/2011  . Chronic diastolic CHF (congestive heart failure) ([place]) 11/25/2011  . Lower extremity edema 11/25/2011  . Nonischemic cardiomyopathy ([place]) 04/17/2011  . CAD (coronary artery disease)   . Bronchiectasis ([place])   . Essential hypertension   . PERSONAL HX COLONIC POLYPS 02/08/2010  . VITAMIN D DEFICIENCY 12/03/2009  . BRONCHITIS, ACUTE 02/28/2009  . NAUSEA AND VOMITING 12/06/2008  . Depression 08/10/2008  . PULMONARY NODULE 07/15/2008  . NEOPLASM, KIDNEY 03/16/2008  . MICROSCOPIC HEMATURIA 03/16/2008  . STRESS INCONTINENCE 03/16/2008  . COLONIC POLYPS 07/19/2007  . Diabetes mellitus without complication ([place]) 17/91/5056  . OBESITY 07/19/2007  . ANEMIA 07/19/2007  . Asthma 07/19/2007  . DIVERTICULOSIS OF COLON 07/19/2007  . ANXIETY 03/19/2007  . GERD 03/19/2007  . DEGENERATIVE JOINT DISEASE 03/19/2007  . FIBROMYALGIA 03/19/2007  . ALLERGY 03/19/2007    Past Surgical History:  Procedure Laterality Date  . ABDOMINAL HYSTERECTOMY    . CARDIAC CATHETERIZATION  11/2011  . CHOLECYSTECTOMY    . COLONOSCOPY W/ BIOPSIES  11-12-12  per Dr. Fuller Plan, diverticulosis only, repeat in 5 yrs  . CORONARY STENT PLACEMENT  2002   BMS to the LAD  . ESOPHAGOGASTRODUODENOSCOPY  12/2008  . HAND SURGERY  01/2006   Right Hand Surgery by Dr. Amedeo Plenty  . LEFT HEART CATHETERIZATION WITH CORONARY ANGIOGRAM N/A 12/07/2011   Procedure: LEFT HEART CATHETERIZATION WITH CORONARY ANGIOGRAM;  Surgeon: Peter M Martinique, MD;  Location: [facility] CATH LAB;  Service: Cardiovascular;  Laterality: N/A;    OB History    No data available       Home Medications    Prior to Admission medications   Medication Sig  Start Date End Date Taking? Authorizing Provider  ACCU-CHEK FASTCLIX LANCETS MISC Test once per day and diagnosis code is E 11.9 12/02/15   [name], MD  acetaminophen (TYLENOL) 500 MG tablet Take 500 mg by mouth every 6 (six) hours as needed.    Historical Provider, MD  albuterol (PROVENTIL) (2.5 MG/3ML) 0.083% nebulizer solution Take 3 mLs (2.5 mg total) by nebulization every 4 (four) hours as needed for wheezing or shortness of breath. 04/25/16   [name], MD  albuterol (VENTOLIN HFA) 108 (90 BASE) MCG/ACT inhaler Inhale 2 puffs into the lungs every 4 (four) hours as needed for wheezing or shortness of breath. 05/25/14   [name], MD  ALPRAZolam Duanne Moron) 0.5 MG tablet TAKE 1 TABLET BY MOUTH 3 TIMES A DAY 04/17/16   [name], MD  amLODipine (NORVASC) 10 MG tablet TAKE 1 TABLET BY MOUTH DAILY. 02/06/16   Jerline Pain, MD  atenolol (TENORMIN) 50 MG tablet TAKE 1 TABLET BY MOUTH DAILY. 08/11/15   Jerline Pain, MD  atorvastatin (LIPITOR) 10 MG tablet TAKE 1 TABLET BY MOUTH DAILY. 04/09/16   Jerline Pain, MD  betamethasone valerate (VALISONE) 0.1 % cream Apply topically 2 (two) times daily. 07/11/15   Landis Martins, DPM  Blood Glucose Calibration (ACCU-CHEK AVIVA) SOLN Perform test as needed 12/02/15   [name], MD  Budesonide (UCERIS) 9 MG TB24 Take 1 tablet by mouth every morning. 07/13/15   Ladene Artist, MD  Cholecalciferol (VITAMIN D-3) 5000 UNITS TABS Take 5,000 Units by mouth daily.      Historical Provider, MD  clarithromycin (BIAXIN) 500 MG tablet Take 1 tablet (500 mg total) by mouth 2 (two) times daily. 05/14/16   [name], MD  clopidogrel (PLAVIX) 75 MG tablet TAKE 1 TABLET (75 MG TOTAL) BY MOUTH DAILY. 08/11/15   Jerline Pain, MD  clotrimazole (LOTRIMIN) 1 % external solution Apply 1 application topically 2 (two) times daily. In between toes 10/18/15   Landis Martins, DPM  diclofenac sodium (VOLTAREN) 1 % GEL Apply 2 g topically 4 (four) times daily.    Historical  Provider, MD  diphenoxylate-atropine (LOMOTIL) 2.5-0.025 MG tablet Take 1 tab 3 times daily , take 1 every morning , then as needed. 03/22/15   Amy S Esterwood, PA-C  fluticasone (FLONASE) 50 MCG/ACT nasal spray PLACE 2 SPRAYS IN EACH NOSTRIL AT BEDTIME 07/26/15   [name], MD  furosemide (LASIX) 40 MG tablet TAKE 1 TABLET (40 MG TOTAL) BY MOUTH 3 (THREE) TIMES DAILY. 03/29/16   Jerline Pain, MD  glimepiride (AMARYL) 1 MG tablet TAKE 1 TABLET BY MOUTH DAILY BEFORE BREAKFAST. 05/25/16   [name], MD  glucose blood (ACCU-CHEK AVIVA) test strip Test once per day and diagnosis code is E 11.9 12/02/15   [name], MD  HYDROcodone-acetaminophen (NORCO/VICODIN) 5-325 MG tablet Take 2 tablets by mouth every 6 (six) hours as needed for severe pain. 07/19/15   [name], MD  HYDROcodone-homatropine (HYDROMET) 5-1.5 MG/5ML syrup Take 5 mLs by mouth every 4 (four) hours as needed. 05/14/16   [name], MD  hydrocortisone (PROCTOSOL HC) 2.5 % rectal cream Place rectally 3 (three) times daily. 12/17/14   Willia Craze, NP  hydrOXYzine (ATARAX/VISTARIL) 25 MG tablet TAKE 1 TABLET EVERY 4 HOURS AS NEEDED FOR ITCHING 05/14/16   [name], MD  isosorbide mononitrate (IMDUR) 60 MG 24 hr tablet TAKE 1 TABLET BY MOUTH DAILY. 02/27/16   Jerline Pain, MD  KLOR-CON M20 20 MEQ tablet TAKE 1 TABLET BY MOUTH 2 TIMES DAILY. 02/28/16   Jerline Pain, MD  levocetirizine (XYZAL) 5 MG tablet TAKE 1 TABLET (5 MG TOTAL) BY MOUTH EVERY EVENING. 10/25/15   [name], MD  LIDODERM 5 % APPLY 1 PATCH TO SKIN FOR 12 HOURS THEN REMOVE AND DISCARD PATCH WITHIN 72 HOURS OR AS DIRECTED 03/10/13   Noralee Space, MD  Loperamide-Simethicone (IMODIUM MULTI-SYMPTOM RELIEF) 2-125 MG TABS Take 1 tablet by mouth as needed. Reported on 11/14/2015    Historical Provider, MD  losartan (COZAAR) 50 MG tablet TAKE 1 TABLET BY MOUTH DAILY. 07/04/15   [name], MD  magnesium oxide (MAG-OX) 400 (241.3 Mg) MG tablet TAKE 1 TABLET  (400 MG TOTAL) BY MOUTH DAILY. 11/24/15   [name], MD  mesalamine (CANASA) 1000 MG suppository Place 1 suppository (1,000 mg total) rectally at bedtime. 03/22/15   Amy S Esterwood, PA-C  Mesalamine (DELZICOL) 400 MG CPDR DR capsule Take 4 capsules (1,600 mg total) by mouth 3 (three) times daily. 05/24/16   Ladene Artist, MD  metFORMIN (GLUCOPHAGE) 1000 MG tablet Take 1 tablet (1,000 mg total) by mouth 2 (two) times daily with a meal. 03/18/15   [name], MD  montelukast (SINGULAIR) 10 MG tablet TAKE 1 TABLET AT BEDTIME 04/25/16   [name], MD  nabumetone (RELAFEN) 750 MG tablet TAKE 1 TABLET BY MOUTH TWICE A DAY WITH A MEAL 07/26/15   [name], MD  Naftifine HCl (NAFTIN) 2 % CREA Apply topically 2 (two) times daily.    Historical Provider, MD  neomycin-polymyxin-hydrocortisone (CORTISPORIN) otic solution Use 1 drop in affected ear three times daily as needed 02/06/12   Noralee Space, MD  nitroGLYCERIN (NITROSTAT) 0.4 MG SL tablet Place 1 tablet (0.4 mg total) under the tongue every 5 (five) minutes as needed for chest pain. 08/21/13   Carlena Bjornstad, MD  ondansetron (ZOFRAN) 4 MG tablet Take 1 tablet (4 mg total) by mouth every 6 (six) hours as needed for nausea or vomiting. 03/01/15   Amy S Esterwood, PA-C  pantoprazole (PROTONIX) 40 MG tablet TAKE 1 TABLET BY MOUTH TWICE DAILY 05/24/16   Ladene Artist, MD  PAZEO 0.7 % SOLN INSTILL 1 DROP INTO BOTH EYES SINGLE DOSE 06/15/15   Historical Provider, MD  predniSONE (DELTASONE) 10 MG tablet Take 1 tablet (10 mg total) by mouth daily with breakfast. 02/23/15   Amy S Esterwood, PA-C  SYMBICORT 80-4.5 MCG/ACT inhaler Inhale 2 puffs into the lungs daily as needed (shortness of breath).  06/01/14   Historical Provider, MD  tiZANidine (ZANAFLEX) 4 MG tablet TAKE 1 TABLET (4 MG TOTAL) BY MOUTH 3 (THREE) TIMES DAILY AS NEEDED FOR MUSCLE SPASMS. 11/28/15   [name], MD  Family History Family History  Problem Relation Age of Onset  .  Pneumonia Father   . Heart attack Father   . Parkinsonism Mother   . Sarcoidosis Brother   . Hypertension Sister   . Diabetes Sister   . Breast cancer Daughter   . Colon cancer Neg Hx     Social History Social History  Substance Use Topics  . Smoking status: Former Smoker    Packs/day: 2.00    Years: 15.00    Types: Cigarettes    Quit date: 04/30/1973  . Smokeless tobacco: Never Used     Comment: ex-smoker: smoked for 10-15 years up to 2ppd, quit 1975.  Marland Kitchen Alcohol use 0.0 oz/week     Comment: occ     Allergies   Doxycycline; Amoxicillin; Aspirin; Azithromycin; Caffeine; Ciprofloxacin; Codeine; Sulfamethizole; and Tramadol hcl   Review of Systems Review of Systems  Constitutional: Negative for appetite change, chills, diaphoresis, fatigue and fever.  HENT: Positive for congestion and rhinorrhea. Negative for mouth sores, sore throat and trouble swallowing.   Eyes: Negative for visual disturbance.  Respiratory: Positive for cough. Negative for chest tightness, shortness of breath and wheezing.   Cardiovascular: Negative for chest pain.  Gastrointestinal: Negative for abdominal distention, abdominal pain, diarrhea, nausea and vomiting.  Endocrine: Negative for polydipsia, polyphagia and polyuria.  Genitourinary: Negative for dysuria, frequency and hematuria.  Musculoskeletal: Negative for gait problem.  Skin: Negative for color change, pallor and rash.  Neurological: Negative for dizziness, syncope, light-headedness and headaches.  Hematological: Does not bruise/bleed easily.  Psychiatric/Behavioral: Negative for behavioral problems and confusion.     Physical Exam Updated Vital Signs BP 158/89 (BP Location: Right Arm)   Pulse 109   Temp 98.4 F (36.9 C) (Oral)   Resp 20   Ht _0  (1.549 m)   Wt 204 lb (92.5 kg)   LMP  (LMP Unknown)   SpO2 95%   BMI 38.55 kg/m   Physical Exam  Constitutional: She is oriented to person, place, and time. She appears  well-developed and well-nourished. No distress.  HENT:  Head: Normocephalic.  Eyes: Conjunctivae are normal. Pupils are equal, round, and reactive to light. No scleral icterus.  Neck: Normal range of motion. Neck supple. No thyromegaly present.  Cardiovascular: Normal rate and regular rhythm.  Exam reveals no gallop and no friction rub.   No murmur heard. Pulmonary/Chest: Effort normal and breath sounds normal. No respiratory distress. She has no wheezes. She has no rales.  Frequent cough. Clear lungs. No prolongation.  Abdominal: Soft. Bowel sounds are normal. She exhibits no distension. There is no tenderness. There is no rebound.  Musculoskeletal: Normal range of motion.  Neurological: She is alert and oriented to person, place, and time.  Skin: Skin is warm and dry. No rash noted.  Psychiatric: She has a normal mood and affect. Her behavior is normal.     ED Treatments / Results  Labs (all labs ordered are listed, but only abnormal results are displayed) Labs Reviewed  BASIC METABOLIC PANEL - Abnormal; Notable for the following:       Result Value   Sodium 134 (*)    Chloride 100 (*)    CO2 20 (*)    Glucose, Bld 339 (*)    All other components within normal limits  CBC - Abnormal; Notable for the following:    WBC 19.3 (*)    Platelets 434 (*)    All other components within normal limits  BRAIN NATRIURETIC PEPTIDE  Randolm Idol, ED    EKG  EKG Interpretation None       Radiology Dg Chest 2 View  Result Date: 05/26/2016 CLINICAL DATA:  Patient with shortness of breath. History of asthma. EXAM: CHEST  2 VIEW COMPARISON:  Chest radiograph 05/21/2016. FINDINGS: Stable enlarged cardiac and mediastinal contours with tortuosity and calcification of the thoracic aorta. No consolidative pulmonary opacities. No pleural effusion or pneumothorax. Mid thoracic spine degenerative changes. IMPRESSION: Mild cardiomegaly.  No acute cardiopulmonary process. Electronically Signed    By: Lovey Newcomer [name]   On: 05/26/2016 16:52    Procedures Procedures (including critical care time)  Medications Ordered in ED Medications - No data to display   Initial Impression / Assessment and Plan / ED Course  I have reviewed the triage vital signs and the nursing notes.  Pertinent labs & imaging results that were available during my care of the patient were reviewed by me and considered in my medical decision making (see chart for details).     Likely and radiographically not in congestive artery. No infiltrates. Well oxygenated. Reassuring labs. Doubt PE. Clinically not congestive heart failure. Normal EKG and no chest pain no ACS. Plan continue Mucinex, Tessalon, Lortab, primary care follow-up.  Final Clinical Impressions(s) / ED Diagnoses   Final diagnoses:  Cough    New Prescriptions New Prescriptions   No medications on file     Tanna Furry, MD 05/26/16 1754

## 2016-05-26 NOTE — Discharge Instructions (Signed)
Continue Mucinex, Tessalon, and Lortab for your cough.  No sign of congestive heart failure, pneumonia, or other findings.

## 2016-06-24 ENCOUNTER — Other Ambulatory Visit: Payer: Self-pay | Admitting: Family Medicine

## 2016-06-25 ENCOUNTER — Ambulatory Visit (INDEPENDENT_AMBULATORY_CARE_PROVIDER_SITE_OTHER): Payer: Medicare Other | Admitting: Family Medicine

## 2016-06-25 ENCOUNTER — Encounter: Payer: Self-pay | Admitting: Family Medicine

## 2016-06-25 VITALS — BP 147/87 | HR 76 | Temp 98.4°F | Ht 60.0 in | Wt 217.0 lb

## 2016-06-25 DIAGNOSIS — E782 Mixed hyperlipidemia: Secondary | ICD-10-CM | POA: Diagnosis not present

## 2016-06-25 DIAGNOSIS — I10 Essential (primary) hypertension: Secondary | ICD-10-CM | POA: Diagnosis not present

## 2016-06-25 DIAGNOSIS — E119 Type 2 diabetes mellitus without complications: Secondary | ICD-10-CM

## 2016-06-25 LAB — BASIC METABOLIC PANEL
BUN: 8 mg/dL (ref 6–23)
CHLORIDE: 101 meq/L (ref 96–112)
CO2: 31 mEq/L (ref 19–32)
CREATININE: 0.44 mg/dL (ref 0.40–1.20)
Calcium: 9.4 mg/dL (ref 8.4–10.5)
GFR: 179.8 mL/min (ref >60.00)
GLUCOSE: 160 mg/dL — AB (ref 70–99)
POTASSIUM: 4.1 meq/L (ref 3.5–5.1)
Sodium: 142 mEq/L (ref 135–145)

## 2016-06-25 LAB — CBC WITH DIFFERENTIAL/PLATELET
BASOS ABS: 0 10*3/uL (ref 0.0–0.1)
Basophils Relative: 0.5 % (ref 0.0–3.0)
Eosinophils Absolute: 0.4 10*3/uL (ref 0.0–0.7)
Eosinophils Relative: 3.7 % (ref 0.0–5.0)
HCT: 36.8 % (ref 36.0–46.0)
Hemoglobin: 12.1 g/dL (ref 12.0–15.0)
LYMPHS ABS: 1.7 10*3/uL (ref 0.7–4.0)
Lymphocytes Relative: 17.4 % (ref 12.0–46.0)
MCHC: 32.9 g/dL (ref 30.0–36.0)
MCV: 92.3 fl (ref 78.0–100.0)
MONO ABS: 0.7 10*3/uL (ref 0.1–1.0)
MONOS PCT: 6.7 % (ref 3.0–12.0)
NEUTROS ABS: 7 10*3/uL (ref 1.4–7.7)
NEUTROS PCT: 71.7 % (ref 43.0–77.0)
PLATELETS: 524 10*3/uL — AB (ref 150.0–400.0)
RBC: 3.99 Mil/uL (ref 3.87–5.11)
RDW: 14.6 % (ref 11.5–15.5)
WBC: 9.8 10*3/uL (ref 4.0–10.5)

## 2016-06-25 LAB — POC URINALSYSI DIPSTICK (AUTOMATED)
BILIRUBIN UA: NEGATIVE
Blood, UA: NEGATIVE
Glucose, UA: NEGATIVE
KETONES UA: NEGATIVE
LEUKOCYTES UA: NEGATIVE
Nitrite, UA: NEGATIVE
PH UA: 6.5
SPEC GRAV UA: 1.015
Urobilinogen, UA: 0.2

## 2016-06-25 LAB — TSH: TSH: 1.08 u[IU]/mL (ref 0.35–4.50)

## 2016-06-25 LAB — LIPID PANEL
CHOLESTEROL: 136 mg/dL (ref 0–200)
HDL: 52.5 mg/dL (ref >39.00)
LDL Cholesterol: 64 mg/dL (ref 0–99)
NonHDL: 83.88
TRIGLYCERIDES: 100 mg/dL (ref 0.0–149.0)
Total CHOL/HDL Ratio: 3
VLDL: 20 mg/dL (ref 0.0–40.0)

## 2016-06-25 LAB — HEPATIC FUNCTION PANEL
ALBUMIN: 4.5 g/dL (ref 3.5–5.2)
ALT: 5 U/L (ref 0–35)
AST: 10 U/L (ref 0–37)
Alkaline Phosphatase: 58 U/L (ref 39–117)
Bilirubin, Direct: 0.1 mg/dL (ref 0.0–0.3)
TOTAL PROTEIN: 7 g/dL (ref 6.0–8.3)
Total Bilirubin: 0.5 mg/dL (ref 0.2–1.2)

## 2016-06-25 LAB — HEMOGLOBIN A1C: HEMOGLOBIN A1C: 8.1 % — AB (ref 4.6–6.5)

## 2016-06-25 MED ORDER — ALPRAZOLAM 0.5 MG PO TABS: 0.5000 mg | ORAL_TABLET | Freq: Three times a day (TID) | ORAL | 5 refills | Status: DC

## 2016-06-25 MED ORDER — POTASSIUM CHLORIDE CRYS ER 20 MEQ PO TBCR: 20.0000 meq | EXTENDED_RELEASE_TABLET | Freq: Two times a day (BID) | ORAL | 3 refills | Status: DC

## 2016-06-25 MED ORDER — ALBUTEROL SULFATE (2.5 MG/3ML) 0.083% IN NEBU: 2.5000 mg | INHALATION_SOLUTION | RESPIRATORY_TRACT | 11 refills | Status: DC | PRN

## 2016-06-25 MED ORDER — METFORMIN HCL 1000 MG PO TABS: 1000.0000 mg | ORAL_TABLET | Freq: Two times a day (BID) | ORAL | 3 refills | Status: DC

## 2016-06-25 MED ORDER — ATENOLOL 50 MG PO TABS: 50.0000 mg | ORAL_TABLET | Freq: Every day | ORAL | 3 refills | Status: DC

## 2016-06-25 NOTE — Progress Notes (Signed)
Subjective:    Patient ID: [name], female    DOB: 10-23-1940, 76 y.o.   MRN: [id number]  HPI Here to follow up. She feels well today although her allergies cause an occasional cough. Her BP is stable. She has received some prednisone lately so she is worried her glucose might be up.    Review of Systems  Constitutional: Negative.   HENT: Negative.   Eyes: Negative.   Respiratory: Positive for cough. Negative for shortness of breath and wheezing.   Cardiovascular: Negative.   Neurological: Negative.        Objective:   Physical Exam  Constitutional: She is oriented to person, place, and time. She appears well-developed and well-nourished.  Neck: No thyromegaly present.  Cardiovascular: Normal rate, regular rhythm, normal heart sounds and intact distal pulses.   Pulmonary/Chest: Effort normal and breath sounds normal. No respiratory distress. She has no wheezes. She has no rales.  Lymphadenopathy:    She has no cervical adenopathy.  Neurological: She is alert and oriented to person, place, and time.          Assessment & Plan:  Her asthma is stable. Her HTN is stable. She will get fasting labs today to follow her diabetes and lipids.  Alysia Penna, MD

## 2016-06-25 NOTE — Progress Notes (Signed)
Pre visit review using our clinic review tool, if applicable. No additional management support is needed unless otherwise documented below in the visit note.

## 2016-06-28 DIAGNOSIS — M542 Cervicalgia: Secondary | ICD-10-CM | POA: Diagnosis not present

## 2016-06-28 DIAGNOSIS — M19012 Primary osteoarthritis, left shoulder: Secondary | ICD-10-CM | POA: Diagnosis not present

## 2016-07-03 DIAGNOSIS — J309 Allergic rhinitis, unspecified: Secondary | ICD-10-CM | POA: Diagnosis not present

## 2016-07-03 DIAGNOSIS — K219 Gastro-esophageal reflux disease without esophagitis: Secondary | ICD-10-CM | POA: Diagnosis not present

## 2016-07-03 DIAGNOSIS — J4541 Moderate persistent asthma with (acute) exacerbation: Secondary | ICD-10-CM | POA: Diagnosis not present

## 2016-07-17 ENCOUNTER — Ambulatory Visit: Payer: Medicare Other | Admitting: Sports Medicine

## 2016-08-21 ENCOUNTER — Other Ambulatory Visit: Payer: Self-pay | Admitting: Family Medicine

## 2016-08-21 NOTE — Telephone Encounter (Signed)
Can we refill this? Drug allergy alert for Sulfa?

## 2016-09-11 NOTE — Telephone Encounter (Signed)
Called pt about scheduling AWV -she is sick and will call us back when she is feeling better.

## 2016-10-05 ENCOUNTER — Other Ambulatory Visit: Payer: Self-pay | Admitting: Family Medicine

## 2016-10-05 ENCOUNTER — Other Ambulatory Visit: Payer: Self-pay | Admitting: Cardiology

## 2016-10-22 ENCOUNTER — Other Ambulatory Visit: Payer: Self-pay | Admitting: Family Medicine

## 2016-10-24 DIAGNOSIS — L918 Other hypertrophic disorders of the skin: Secondary | ICD-10-CM | POA: Diagnosis not present

## 2016-10-24 DIAGNOSIS — L818 Other specified disorders of pigmentation: Secondary | ICD-10-CM | POA: Diagnosis not present

## 2016-10-24 DIAGNOSIS — L308 Other specified dermatitis: Secondary | ICD-10-CM | POA: Diagnosis not present

## 2016-10-27 ENCOUNTER — Other Ambulatory Visit: Payer: Self-pay | Admitting: Cardiology

## 2016-11-03 DIAGNOSIS — M79602 Pain in left arm: Secondary | ICD-10-CM | POA: Diagnosis not present

## 2016-11-06 ENCOUNTER — Encounter: Payer: Self-pay | Admitting: Podiatry

## 2016-11-06 ENCOUNTER — Ambulatory Visit (INDEPENDENT_AMBULATORY_CARE_PROVIDER_SITE_OTHER): Payer: Medicare Other | Admitting: Podiatry

## 2016-11-06 DIAGNOSIS — B351 Tinea unguium: Secondary | ICD-10-CM | POA: Diagnosis not present

## 2016-11-06 DIAGNOSIS — E1151 Type 2 diabetes mellitus with diabetic peripheral angiopathy without gangrene: Secondary | ICD-10-CM

## 2016-11-06 NOTE — Progress Notes (Signed)
   Subjective:    Patient ID: [name], female    DOB: May 06, 1940, 76 y.o.   MRN: [id number]  HPI    Review of Systems  All other systems reviewed and are negative.      Objective:   Physical Exam        Assessment & Plan:

## 2016-11-06 NOTE — Progress Notes (Signed)
Patient ID: [name], female   DOB: March 03, 1941, 76 y.o.   MRN: [id number]     Subjective: This patient presents today  Complaining that the right hallux toenail last several weeks as beginning to loose and and sloughs from the nailbed. Patient denies any active drainage or warmth surrounding the nail She alsois complaining that her toenails are thick and elongated and difficult for to trim as well as uncomfortable walking wearing shoes and is requesting nail debridement  patient's grandson is present in the treatment room Patient is diabetic   objective: Patient appears orientated 3 DP and PT pulses 1/4 bilaterally Capillary reflex within normal limits bilaterally Sedation 10 g monofilament wire intact 3/5 bilaterally Vibratory sensation reactive bilaterally Ankle reflexes reactive bilaterally No open skin lesions bilaterally Right hallux nail is sloughing, without any erythema, edema or drainage Toenails 6-10 are elongated Ritalin deformed and tender to direct palpation Manual motor testing dorsi flexion 4/5 bilaterally Plantar flexion 5/5 bilaterally  Assessment: Diabetic with peripheral arterial disease Diabetic with peripheral neuropathy Mycotic toenails 6-10 with symptoms  Plan: Debridement toenails 6-10 mechanically and electrically with slight bleeding distal third right toe daily with topical antibiotic ointment and Band-Aid. Patient instructed with Band-Aid 1-3 days and continue apply topical antibiotic ointment and Band-Aid until healed. Patient has questions about treatment of mycotic toenails with topical medication I recommended periodic debridement as topical medications generally did not resolve nail fungal infections  Reappoint 3 months for Dr. Cannon Kettle

## 2016-11-06 NOTE — Patient Instructions (Signed)
Remove the Band-Aid on the third right toe and 1-3 days continue to apply topical antibiotic ointment and Band-Aid daily until a scab forms I recommend periodic debridement of the dry fungal toenails  Diabetes and Foot Care Diabetes may cause you to have problems because of poor blood supply (circulation) to your feet and legs. This may cause the skin on your feet to become thinner, break easier, and heal more slowly. Your skin may become dry, and the skin may peel and crack. You may also have nerve damage in your legs and feet causing decreased feeling in them. You may not notice minor injuries to your feet that could lead to infections or more serious problems. Taking care of your feet is one of the most important things you can do for yourself. Follow these instructions at home:  Wear shoes at all times, even in the house. Do not go barefoot. Bare feet are easily injured.  Check your feet daily for blisters, cuts, and redness. If you cannot see the bottom of your feet, use a mirror or ask someone for help.  Wash your feet with warm water (do not use hot water) and mild soap. Then pat your feet and the areas between your toes until they are completely dry. Do not soak your feet as this can dry your skin.  Apply a moisturizing lotion or petroleum jelly (that does not contain alcohol and is unscented) to the skin on your feet and to dry, brittle toenails. Do not apply lotion between your toes.  Trim your toenails straight across. Do not dig under them or around the cuticle. File the edges of your nails with an emery board or nail file.  Do not cut corns or calluses or try to remove them with medicine.  Wear clean socks or stockings every day. Make sure they are not too tight. Do not wear knee-high stockings since they may decrease blood flow to your legs.  Wear shoes that fit properly and have enough cushioning. To break in new shoes, wear them for just a few hours a day. This prevents you from  injuring your feet. Always look in your shoes before you put them on to be sure there are no objects inside.  Do not cross your legs. This may decrease the blood flow to your feet.  If you find a minor scrape, cut, or break in the skin on your feet, keep it and the skin around it clean and dry. These areas may be cleansed with mild soap and water. Do not cleanse the area with peroxide, alcohol, or iodine.  When you remove an adhesive bandage, be sure not to damage the skin around it.  If you have a wound, look at it several times a day to make sure it is healing.  Do not use heating pads or hot water bottles. They may burn your skin. If you have lost feeling in your feet or legs, you may not know it is happening until it is too late.  Make sure your health care provider performs a complete foot exam at least annually or more often if you have foot problems. Report any cuts, sores, or bruises to your health care provider immediately. Contact a health care provider if:  You have an injury that is not healing.  You have cuts or breaks in the skin.  You have an ingrown nail.  You notice redness on your legs or feet.  You feel burning or tingling in your legs or  feet.  You have pain or cramps in your legs and feet.  Your legs or feet are numb.  Your feet always feel cold. Get help right away if:  There is increasing redness, swelling, or pain in or around a wound.  There is a red line that goes up your leg.  Pus is coming from a wound.  You develop a fever or as directed by your health care provider.  You notice a bad smell coming from an ulcer or wound. This information is not intended to replace advice given to you by your health care provider. Make sure you discuss any questions you have with your health care provider. Document Released: 04/13/2000 Document Revised: 09/22/2015 Document Reviewed: 09/23/2012 Elsevier Interactive Patient Education  2017 [employer].

## 2016-11-07 ENCOUNTER — Other Ambulatory Visit: Payer: Self-pay | Admitting: *Deleted

## 2016-11-07 ENCOUNTER — Other Ambulatory Visit: Payer: Self-pay | Admitting: Family Medicine

## 2016-11-07 MED ORDER — NITROGLYCERIN 0.4 MG SL SUBL: 0.4000 mg | SUBLINGUAL_TABLET | SUBLINGUAL | 1 refills | Status: DC | PRN

## 2016-11-22 ENCOUNTER — Other Ambulatory Visit: Payer: Self-pay | Admitting: Cardiology

## 2016-12-03 ENCOUNTER — Telehealth: Payer: Self-pay | Admitting: Family Medicine

## 2016-12-03 ENCOUNTER — Other Ambulatory Visit: Payer: Medicare Other

## 2016-12-03 ENCOUNTER — Other Ambulatory Visit: Payer: Self-pay | Admitting: Family Medicine

## 2016-12-03 DIAGNOSIS — E119 Type 2 diabetes mellitus without complications: Secondary | ICD-10-CM

## 2016-12-03 LAB — POCT GLYCOSYLATED HEMOGLOBIN (HGB A1C): Hemoglobin A1C: 6.8

## 2016-12-03 NOTE — Telephone Encounter (Signed)
Pt has been scheduled.  °

## 2016-12-03 NOTE — Telephone Encounter (Signed)
Pt states she was supposed to have her A1C checked this month.  But there are no orders. Pt was hoping to come in this am. Ok to schedule?

## 2016-12-03 NOTE — Telephone Encounter (Signed)
I put in lab order, can you call pt back to schedule lab only.

## 2016-12-05 DIAGNOSIS — M545 Low back pain: Secondary | ICD-10-CM | POA: Diagnosis not present

## 2016-12-10 ENCOUNTER — Other Ambulatory Visit: Payer: Self-pay | Admitting: Cardiology

## 2016-12-16 ENCOUNTER — Emergency Department (HOSPITAL_COMMUNITY): Payer: Medicare Other

## 2016-12-16 ENCOUNTER — Encounter (HOSPITAL_COMMUNITY): Payer: Self-pay | Admitting: *Deleted

## 2016-12-16 ENCOUNTER — Observation Stay (HOSPITAL_COMMUNITY)
Admission: EM | Admit: 2016-12-16 | Discharge: 2016-12-17 | Disposition: A | Discharge status: Home / Self Care | Payer: Medicare Other | Attending: Internal Medicine | Admitting: Internal Medicine

## 2016-12-16 DIAGNOSIS — R072 Precordial pain: Secondary | ICD-10-CM | POA: Diagnosis not present

## 2016-12-16 DIAGNOSIS — F419 Anxiety disorder, unspecified: Secondary | ICD-10-CM | POA: Diagnosis not present

## 2016-12-16 DIAGNOSIS — I5030 Unspecified diastolic (congestive) heart failure: Secondary | ICD-10-CM | POA: Diagnosis present

## 2016-12-16 DIAGNOSIS — J45909 Unspecified asthma, uncomplicated: Secondary | ICD-10-CM | POA: Diagnosis present

## 2016-12-16 DIAGNOSIS — Z9049 Acquired absence of other specified parts of digestive tract: Secondary | ICD-10-CM | POA: Diagnosis not present

## 2016-12-16 DIAGNOSIS — E785 Hyperlipidemia, unspecified: Secondary | ICD-10-CM | POA: Diagnosis present

## 2016-12-16 DIAGNOSIS — I2510 Atherosclerotic heart disease of native coronary artery without angina pectoris: Secondary | ICD-10-CM | POA: Diagnosis not present

## 2016-12-16 DIAGNOSIS — I110 Hypertensive heart disease with heart failure: Secondary | ICD-10-CM | POA: Insufficient documentation

## 2016-12-16 DIAGNOSIS — I5032 Chronic diastolic (congestive) heart failure: Secondary | ICD-10-CM | POA: Insufficient documentation

## 2016-12-16 DIAGNOSIS — Z87891 Personal history of nicotine dependence: Secondary | ICD-10-CM | POA: Insufficient documentation

## 2016-12-16 DIAGNOSIS — M1990 Unspecified osteoarthritis, unspecified site: Secondary | ICD-10-CM | POA: Diagnosis present

## 2016-12-16 DIAGNOSIS — R079 Chest pain, unspecified: Secondary | ICD-10-CM | POA: Diagnosis not present

## 2016-12-16 DIAGNOSIS — E7800 Pure hypercholesterolemia, unspecified: Secondary | ICD-10-CM | POA: Diagnosis present

## 2016-12-16 DIAGNOSIS — E119 Type 2 diabetes mellitus without complications: Secondary | ICD-10-CM

## 2016-12-16 DIAGNOSIS — K5190 Ulcerative colitis, unspecified, without complications: Secondary | ICD-10-CM | POA: Diagnosis present

## 2016-12-16 DIAGNOSIS — T7840XA Allergy, unspecified, initial encounter: Secondary | ICD-10-CM | POA: Diagnosis present

## 2016-12-16 LAB — BASIC METABOLIC PANEL
Anion gap: 11 (ref 5–15)
BUN: 10 mg/dL (ref 6–20)
CHLORIDE: 102 mmol/L (ref 101–111)
CO2: 26 mmol/L (ref 22–32)
Calcium: 9.2 mg/dL (ref 8.9–10.3)
Creatinine, Ser: 0.52 mg/dL (ref 0.44–1.00)
GFR calc Af Amer: >60 mL/min (ref >60)
GFR calc non Af Amer: >60 mL/min (ref >60)
GLUCOSE: 146 mg/dL — AB (ref 65–99)
POTASSIUM: 3.9 mmol/L (ref 3.5–5.1)
Sodium: 139 mmol/L (ref 135–145)

## 2016-12-16 LAB — CBC
HEMATOCRIT: 38.3 % (ref 36.0–46.0)
Hemoglobin: 12.5 g/dL (ref 12.0–15.0)
MCH: 29.5 pg (ref 26.0–34.0)
MCHC: 32.6 g/dL (ref 30.0–36.0)
MCV: 90.3 fL (ref 78.0–100.0)
Platelets: 446 10*3/uL — ABNORMAL HIGH (ref 150–400)
RBC: 4.24 MIL/uL (ref 3.87–5.11)
RDW: 14.5 % (ref 11.5–15.5)
WBC: 12.7 10*3/uL — AB (ref 4.0–10.5)

## 2016-12-16 LAB — I-STAT TROPONIN, ED: Troponin i, poc: 0.01 ng/mL (ref 0.00–0.08)

## 2016-12-16 LAB — TROPONIN I

## 2016-12-16 LAB — GLUCOSE, CAPILLARY: Glucose-Capillary: 129 mg/dL — ABNORMAL HIGH (ref 65–99)

## 2016-12-16 MED ORDER — ACETAMINOPHEN 325 MG PO TABS
650.0000 mg | ORAL_TABLET | ORAL | Status: DC | PRN
  Administered 2016-12-17: 650 mg via ORAL
  Filled 2016-12-16: qty 2

## 2016-12-16 MED ORDER — PANTOPRAZOLE SODIUM 40 MG PO TBEC
40.0000 mg | DELAYED_RELEASE_TABLET | Freq: Two times a day (BID) | ORAL | Status: DC
  Administered 2016-12-16 – 2016-12-17 (×2): 40 mg via ORAL
  Filled 2016-12-16 (×2): qty 1

## 2016-12-16 MED ORDER — POLYETHYL GLYCOL-PROPYL GLYCOL 0.4-0.3 % OP GEL
Freq: Every day | OPHTHALMIC | Status: DC
  Filled 2016-12-16: qty 10

## 2016-12-16 MED ORDER — ATORVASTATIN CALCIUM 10 MG PO TABS
10.0000 mg | ORAL_TABLET | Freq: Every day | ORAL | Status: DC
  Administered 2016-12-17: 10 mg via ORAL
  Filled 2016-12-16: qty 1

## 2016-12-16 MED ORDER — HEPARIN SODIUM (PORCINE) 5000 UNIT/ML IJ SOLN
5000.0000 [IU] | Freq: Three times a day (TID) | INTRAMUSCULAR | Status: DC
  Administered 2016-12-16 – 2016-12-17 (×2): 5000 [IU] via SUBCUTANEOUS
  Filled 2016-12-16 (×2): qty 1

## 2016-12-16 MED ORDER — OLOPATADINE HCL 0.1 % OP SOLN
1.0000 [drp] | Freq: Every day | OPHTHALMIC | Status: DC | PRN
  Filled 2016-12-16: qty 5

## 2016-12-16 MED ORDER — AMLODIPINE BESYLATE 10 MG PO TABS
10.0000 mg | ORAL_TABLET | Freq: Every day | ORAL | Status: DC
  Administered 2016-12-17: 10 mg via ORAL
  Filled 2016-12-16: qty 1

## 2016-12-16 MED ORDER — NITROGLYCERIN 0.4 MG SL SUBL
0.4000 mg | SUBLINGUAL_TABLET | SUBLINGUAL | Status: DC | PRN
  Administered 2016-12-16 (×2): 0.4 mg via SUBLINGUAL
  Filled 2016-12-16: qty 1

## 2016-12-16 MED ORDER — NABUMETONE 500 MG PO TABS
750.0000 mg | ORAL_TABLET | Freq: Every day | ORAL | Status: DC
  Administered 2016-12-17: 750 mg via ORAL
  Filled 2016-12-16: qty 1

## 2016-12-16 MED ORDER — HYDROCODONE-ACETAMINOPHEN 10-325 MG PO TABS
1.0000 | ORAL_TABLET | Freq: Two times a day (BID) | ORAL | Status: DC
  Administered 2016-12-16 – 2016-12-17 (×2): 1 via ORAL
  Filled 2016-12-16 (×2): qty 1

## 2016-12-16 MED ORDER — ATENOLOL 25 MG PO TABS
50.0000 mg | ORAL_TABLET | Freq: Every day | ORAL | Status: DC
  Administered 2016-12-17: 50 mg via ORAL
  Filled 2016-12-16 (×2): qty 2

## 2016-12-16 MED ORDER — ONDANSETRON HCL 4 MG/2ML IJ SOLN: 4.0000 mg | Freq: Four times a day (QID) | INTRAMUSCULAR | Status: DC | PRN

## 2016-12-16 MED ORDER — MESALAMINE 1000 MG RE SUPP: 1000.0000 mg | Freq: Every day | RECTAL | Status: DC

## 2016-12-16 MED ORDER — ALBUTEROL SULFATE (2.5 MG/3ML) 0.083% IN NEBU: 2.5000 mg | INHALATION_SOLUTION | RESPIRATORY_TRACT | Status: DC | PRN

## 2016-12-16 MED ORDER — ISOSORBIDE MONONITRATE ER 60 MG PO TB24
60.0000 mg | ORAL_TABLET | Freq: Every day | ORAL | Status: DC
  Administered 2016-12-17: 60 mg via ORAL
  Filled 2016-12-16: qty 1

## 2016-12-16 MED ORDER — POLYVINYL ALCOHOL 1.4 % OP SOLN
1.0000 [drp] | Freq: Every day | OPHTHALMIC | Status: DC
  Administered 2016-12-16: 1 [drp] via OPHTHALMIC
  Filled 2016-12-16: qty 15

## 2016-12-16 MED ORDER — FLUTICASONE PROPIONATE 50 MCG/ACT NA SUSP
2.0000 | Freq: Every day | NASAL | Status: DC
  Filled 2016-12-16: qty 16

## 2016-12-16 MED ORDER — FUROSEMIDE 40 MG PO TABS
40.0000 mg | ORAL_TABLET | Freq: Every day | ORAL | Status: DC
  Administered 2016-12-17: 40 mg via ORAL
  Filled 2016-12-16: qty 1

## 2016-12-16 MED ORDER — NITROGLYCERIN 0.4 MG SL SUBL: 0.4000 mg | SUBLINGUAL_TABLET | SUBLINGUAL | Status: DC | PRN

## 2016-12-16 MED ORDER — MESALAMINE 400 MG PO CPDR
1600.0000 mg | DELAYED_RELEASE_CAPSULE | Freq: Three times a day (TID) | ORAL | Status: DC
  Administered 2016-12-16 – 2016-12-17 (×2): 1600 mg via ORAL
  Filled 2016-12-16 (×2): qty 4

## 2016-12-16 MED ORDER — INSULIN ASPART 100 UNIT/ML ~~LOC~~ SOLN: 0.0000 [IU] | Freq: Every day | SUBCUTANEOUS | Status: DC

## 2016-12-16 MED ORDER — LOSARTAN POTASSIUM 50 MG PO TABS
50.0000 mg | ORAL_TABLET | Freq: Every day | ORAL | Status: DC
  Administered 2016-12-17: 50 mg via ORAL
  Filled 2016-12-16: qty 1

## 2016-12-16 MED ORDER — ALPRAZOLAM 0.5 MG PO TABS
0.5000 mg | ORAL_TABLET | Freq: Two times a day (BID) | ORAL | Status: DC | PRN
  Administered 2016-12-16: 0.5 mg via ORAL
  Filled 2016-12-16: qty 1

## 2016-12-16 MED ORDER — MOMETASONE FURO-FORMOTEROL FUM 200-5 MCG/ACT IN AERO
2.0000 | INHALATION_SPRAY | Freq: Two times a day (BID) | RESPIRATORY_TRACT | Status: DC
  Administered 2016-12-17: 2 via RESPIRATORY_TRACT
  Filled 2016-12-16: qty 8.8

## 2016-12-16 MED ORDER — CLOPIDOGREL BISULFATE 75 MG PO TABS
75.0000 mg | ORAL_TABLET | Freq: Every day | ORAL | Status: DC
  Administered 2016-12-17: 75 mg via ORAL
  Filled 2016-12-16: qty 1

## 2016-12-16 MED ORDER — INSULIN ASPART 100 UNIT/ML ~~LOC~~ SOLN
0.0000 [IU] | Freq: Three times a day (TID) | SUBCUTANEOUS | Status: DC
Start: 2016-12-17 — End: 2016-12-17
  Administered 2016-12-17: 2 [IU] via SUBCUTANEOUS
  Administered 2016-12-17: 5 [IU] via SUBCUTANEOUS

## 2016-12-16 MED ORDER — MONTELUKAST SODIUM 10 MG PO TABS
10.0000 mg | ORAL_TABLET | Freq: Every day | ORAL | Status: DC
  Administered 2016-12-16: 10 mg via ORAL
  Filled 2016-12-16: qty 1

## 2016-12-16 NOTE — H&P (Signed)
History and Physical    [name] CZY:[id number] DOB: 04/25/1941 DOA: 12/16/2016  PCP: [name], MD  Cardiologist: Candee Furbish Patient coming from: Home    Chief Complaint: chest discomfort  HPI: [name] is a 76 y.o. female with medical history significant of CAD with stent placement 2001, ulcerative colitis, congestive heart failure, seasonal allergies, diabetes mellitus. She presents with 2 days complaint of chest discomfort described as a spasm sensation starting from her left arm and traveling to her chest. Nothing she is aware of makes it better or worse. Has some minimal improvement with nitroglycerin. She does not report any activity as the inciting factor of her chest discomfort. The pain comes on sporadically and leaves sporadically as well. The problem has been persistent and as such patient decided to seek further medical evaluation. Per my discussion with patient she had stent placement for CAD in 2001.  ED Course: Given patient's history of prior CAD in 2001 requiring stent placement, multiple medical problems including diabetes hyper-lipidemia we were consulted for chest pain rule out  Review of Systems: As per HPI otherwise 10 point review of systems negative.    Past Medical History:  Diagnosis Date  . Adenomatous colon polyp 02/2006  . Allergy, unspecified not elsewhere classified   . Anemia   . Anxiety   . Arthritis   . Bronchiectasis   . CAD (coronary artery disease)    BMS to the LAD, 2002; cath 12/07/11 patent LAD stent and mild nonobstructive disease, EF 65%; Medical management  . CHF (congestive heart failure) (HCC)    Diastolic  . Diabetes mellitus   . Diverticulosis of colon   . DJD (degenerative joint disease)   . Ejection fraction   . Fibromyalgia   . GERD (gastroesophageal reflux disease)   . Hypercholesterolemia   . Hypertension   . Microscopic hematuria   . Neoplasm of kidney   . Obesity   . Other diseases of lung, not elsewhere  classified   . Pinched vertebral nerve   . Pulmonary nodule    Negative PET in 2010  . Stress incontinence, female   . Syncope   . Ulcerative colitis, left sided ([place]) 2008   Hx of  . UTI (lower urinary tract infection)   . Vitamin D deficiency disease   . Volume overload     Past Surgical History:  Procedure Laterality Date  . ABDOMINAL HYSTERECTOMY    . CARDIAC CATHETERIZATION  11/2011  . CHOLECYSTECTOMY    . COLONOSCOPY W/ BIOPSIES  11-12-12   per Dr. Fuller Plan, diverticulosis only, repeat in 5 yrs  . CORONARY STENT PLACEMENT  2002   BMS to the LAD  . ESOPHAGOGASTRODUODENOSCOPY  12/2008  . HAND SURGERY  01/2006   Right Hand Surgery by Dr. Amedeo Plenty  . LEFT HEART CATHETERIZATION WITH CORONARY ANGIOGRAM N/A 12/07/2011   Procedure: LEFT HEART CATHETERIZATION WITH CORONARY ANGIOGRAM;  Surgeon: Peter M Martinique, MD;  Location: [facility] CATH LAB;  Service: Cardiovascular;  Laterality: N/A;     reports that she quit smoking about 43 years ago. Her smoking use included Cigarettes. She has a 30.00 pack-year smoking history. She has never used smokeless tobacco. She reports that she drinks alcohol. She reports that she does not use drugs.  Allergies  Allergen Reactions  . Doxycycline Other (See Comments)    Unsteady gait  . Amoxicillin Other (See Comments)    Unknown reaction   . Aspirin Nausea And Vomiting  . Azithromycin Other (See Comments)  REACTION: pt states "ZPak doesn't work"  . Caffeine Nausea And Vomiting  . Ciprofloxacin Nausea And Vomiting  . Codeine Nausea And Vomiting  . Sulfamethizole Other (See Comments)    Unknown reaction  . Tramadol Hcl Other (See Comments)    REACTION: pt states "spaced-out"    Family History  Problem Relation Age of Onset  . Pneumonia Father   . Heart attack Father   . Parkinsonism Mother   . Sarcoidosis Brother   . Hypertension Sister   . Diabetes Sister   . Breast cancer Daughter   . Colon cancer Neg Hx      Prior to Admission medications     Medication Sig Start Date End Date Taking? Authorizing Provider  acetaminophen (TYLENOL) 500 MG tablet Take 500 mg by mouth every 6 (six) hours as needed for headache (pain).    Yes [provider]  albuterol (PROVENTIL) (2.5 MG/3ML) 0.083% nebulizer solution Take 3 mLs (2.5 mg total) by nebulization every 4 (four) hours as needed for wheezing or shortness of breath. 06/25/16  Yes [name], MD  albuterol (VENTOLIN HFA) 108 (90 BASE) MCG/ACT inhaler Inhale 2 puffs into the lungs every 4 (four) hours as needed for wheezing or shortness of breath. 05/25/14  Yes [name], MD  ALPRAZolam Duanne Moron) 0.5 MG tablet Take 1 tablet (0.5 mg total) by mouth 3 (three) times daily. Patient taking differently: Take 0.5 mg by mouth See admin instructions. Take 1 tablet (0.5 mg) by mouth daily at bedtime, may also take 1 tablet (0.5 mg) during the day as needed for nerves/anxiety 06/25/16  Yes [name], MD  amLODipine (NORVASC) 10 MG tablet Take 1 tablet (10 mg total) by mouth daily. 11/22/16  Yes Jerline Pain, MD  atenolol (TENORMIN) 50 MG tablet Take 1 tablet (50 mg total) by mouth daily. 06/25/16  Yes [name], MD  atorvastatin (LIPITOR) 10 MG tablet TAKE 1 TABLET BY MOUTH DAILY. Patient taking differently: TAKE 1 TABLET (10 MG) BY MOUTH DAILY. 04/09/16  Yes Jerline Pain, MD  budesonide-formoterol (SYMBICORT) 160-4.5 MCG/ACT inhaler Inhale 2 puffs into the lungs 2 (two) times daily.   Yes [provider]  clopidogrel (PLAVIX) 75 MG tablet TAKE 1 TABLET (75 MG TOTAL) BY MOUTH DAILY. 10/05/16  Yes Jerline Pain, MD  clotrimazole (LOTRIMIN) 1 % external solution Apply 1 application topically 2 (two) times daily. In between toes Patient taking differently: Apply 1 application topically See admin instructions. Apply topically between toes every time you bathe 10/18/15  Yes Stover, Titorya, DPM  diclofenac sodium (VOLTAREN) 1 % GEL Apply 2 g topically 4 (four) times daily.   Yes  [provider]  fluticasone (FLONASE) 50 MCG/ACT nasal spray PLACE 2 SPRAYS IN EACH NOSTRIL AT BEDTIME Patient taking differently: PLACE 2 SPRAYS IN EACH NOSTRIL DAILY 07/26/15  Yes [name], MD  furosemide (LASIX) 40 MG tablet TAKE 1 TABLET (40 MG TOTAL) BY MOUTH 3 (THREE) TIMES DAILY. Patient taking differently: TAKE 2 TABLETS (80 MG) BY MOUTH DAILY 03/29/16  Yes Jerline Pain, MD  glimepiride (AMARYL) 1 MG tablet TAKE 1 TABLET BY MOUTH DAILY BEFORE BREAKFAST. Patient taking differently: TAKE 1 TABLET (1 MG)  BY MOUTH DAILY BEFORE BREAKFAST. 08/21/16  Yes [name], MD  HYDROcodone-acetaminophen (NORCO) 10-325 MG tablet Take 1 tablet by mouth 2 (two) times daily. 12/10/16  Yes [provider]  HYDROcodone-homatropine (HYDROMET) 5-1.5 MG/5ML syrup Take 5 mLs by mouth every 4 (  four) hours as needed. Patient taking differently: Take 5 mLs by mouth every 4 (four) hours as needed for cough.  05/14/16  Yes [name], MD  hydrocortisone (PROCTOSOL HC) 2.5 % rectal cream Place rectally 3 (three) times daily. Patient taking differently: Place 1 application rectally 3 (three) times daily as needed for hemorrhoids.  12/17/14  Yes Willia Craze, NP  hydrOXYzine (ATARAX/VISTARIL) 25 MG tablet TAKE 1 TABLET EVERY 4 HOURS AS NEEDED FOR ITCHING Patient taking differently: Take 25 mg by mouth 2 (two) times daily. For itching 05/14/16  Yes [name], MD  isosorbide mononitrate (IMDUR) 60 MG 24 hr tablet Take 1 tablet (60 mg total) by mouth daily. Please call and schedule November 2018 follow up [phone] 12/11/16  Yes Jerline Pain, MD  levocetirizine (XYZAL) 5 MG tablet TAKE 1 TABLET (5 MG TOTAL) BY MOUTH EVERY EVENING. Patient taking differently: TAKE 1 TABLET (5 MG TOTAL) BY MOUTH DAILY AT BEDTIME 10/05/16  Yes [name], MD  LIDODERM 5 % APPLY 1 PATCH TO SKIN FOR 12 HOURS THEN REMOVE AND DISCARD PATCH WITHIN 72 HOURS OR AS DIRECTED Patient taking differently: APPLY  1 PATCH TO SKIN FOR 12 HOURS THEN REMOVE AND DISCARD PATCH WITHIN 72 HOURS - AS NEEDED FOR PAIN OR AS DIRECTED 03/10/13  Yes Noralee Space, MD  loperamide (IMODIUM) 2 MG capsule Take 4-6 mg by mouth daily.   Yes [provider]  losartan (COZAAR) 50 MG tablet TAKE 1 TABLET BY MOUTH DAILY. Patient taking differently: TAKE 1 TABLET (50 MG) BY MOUTH DAILY. 06/25/16  Yes [name], MD  magnesium oxide (MAG-OX) 400 MG tablet TAKE 1 TABLET BY MOUTH DAILY. Patient taking differently: TAKE 1 TABLET (400 MG) BY MOUTH DAILY. 11/07/16  Yes [name], MD  mesalamine (CANASA) 1000 MG suppository Place 1 suppository (1,000 mg total) rectally at bedtime. Patient taking differently: Place 1,000 mg rectally at bedtime as needed (ulcerative colitis).  03/22/15  Yes Esterwood, Amy S, PA-C  Mesalamine (DELZICOL) 400 MG CPDR DR capsule Take 4 capsules (1,600 mg total) by mouth 3 (three) times daily. 05/24/16  Yes Ladene Artist, MD  metFORMIN (GLUCOPHAGE) 1000 MG tablet Take 1 tablet (1,000 mg total) by mouth 2 (two) times daily with a meal. 06/25/16  Yes [name], MD  montelukast (SINGULAIR) 10 MG tablet TAKE 1 TABLET AT BEDTIME Patient taking differently: TAKE 1 TABLET (10 MG)  AT BEDTIME 10/22/16  Yes [name], MD  nabumetone (RELAFEN) 750 MG tablet TAKE 1 TABLET BY MOUTH TWICE A DAY WITH A MEAL Patient taking differently: TAKE 1 TABLET (750 MG) BY MOUTH TWICE A DAY WITH A MEAL 07/26/15  Yes [name], MD  neomycin-polymyxin-hydrocortisone (CORTISPORIN) otic solution Use 1 drop in affected ear three times daily as needed Patient taking differently: Place 2 drops into both ears daily as needed (ear pain).  02/06/12  Yes Noralee Space, MD  nitroGLYCERIN (NITROSTAT) 0.4 MG SL tablet Place 1 tablet (0.4 mg total) under the tongue every 5 (five) minutes as needed for chest pain. 11/07/16  Yes Jerline Pain, MD  Olopatadine HCl (PAZEO) 0.7 % SOLN Place 1 drop into both eyes daily as needed  (itching/allergies).   Yes [provider]  ondansetron (ZOFRAN) 4 MG tablet Take 1 tablet (4 mg total) by mouth every 6 (six) hours as needed for nausea or vomiting. 03/01/15  Yes Esterwood, Amy S, PA-C  pantoprazole (PROTONIX) 40 MG  tablet TAKE 1 TABLET BY MOUTH TWICE DAILY Patient taking differently: Take 40 mg by mouth 2 (two) times daily.  05/24/16  Yes Ladene Artist, MD  Polyethyl Glycol-Propyl Glycol (SYSTANE OP) Place 1 drop into both eyes daily. For dry eyes   Yes [provider]  potassium chloride SA (KLOR-CON M20) 20 MEQ tablet Take 1 tablet (20 mEq total) by mouth 2 (two) times daily. Patient taking differently: Take 20 mEq by mouth daily.  06/25/16  Yes [name], MD  tiZANidine (ZANAFLEX) 4 MG tablet TAKE 1 TABLET (4 MG TOTAL) BY MOUTH 3 (THREE) TIMES DAILY AS NEEDED FOR MUSCLE SPASMS. Patient taking differently: TAKE 1 TABLET (4 MG TOTAL) BY MOUTH TWICE DAILY 11/28/15  Yes [name], MD  ACCU-CHEK FASTCLIX LANCETS MISC Test once per day and diagnosis code is E 11.9 12/02/15   [name], MD  azelastine (ASTELIN) 0.1 % nasal spray Place 1 spray into both nostrils at bedtime. 12/08/16   [provider]  betamethasone valerate (VALISONE) 0.1 % cream Apply topically 2 (two) times daily. 07/11/15   Landis Martins, DPM  Blood Glucose Calibration (ACCU-CHEK AVIVA) SOLN Perform test as needed 12/02/15   [name], MD  Budesonide (UCERIS) 9 MG TB24 Take 1 tablet by mouth every morning. Patient not taking: Reported on 12/16/2016 07/13/15   Ladene Artist, MD  Cholecalciferol (VITAMIN D-3) 5000 UNITS TABS Take 5,000 Units by mouth daily.      [provider]  diphenoxylate-atropine (LOMOTIL) 2.5-0.025 MG tablet Take 1 tab 3 times daily , take 1 every morning , then as needed. Patient not taking: Reported on 12/16/2016 03/22/15   Alfredia Ferguson, PA-C  glucose blood (ACCU-CHEK AVIVA) test strip Test once per day and diagnosis code is E 11.9  12/02/15   [name], MD  HYDROcodone-acetaminophen (NORCO/VICODIN) 5-325 MG tablet Take 2 tablets by mouth every 6 (six) hours as needed for severe pain. Patient not taking: Reported on 12/16/2016 07/19/15   [name], MD  predniSONE (DELTASONE) 10 MG tablet Take 1 tablet (10 mg total) by mouth daily with breakfast. Patient not taking: Reported on 12/16/2016 02/23/15   Alfredia Ferguson, PA-C    Physical Exam: Vitals:   12/16/16 1433 12/16/16 1436  BP:  (!) 141/82  Pulse:  77  Resp:  20  Temp:  98 F (36.7 C)  TempSrc:  Oral  SpO2:  95%  Weight: 98.4 kg (217 lb)     Constitutional: NAD, calm, comfortable Vitals:   12/16/16 1433 12/16/16 1436  BP:  (!) 141/82  Pulse:  77  Resp:  20  Temp:  98 F (36.7 C)  TempSrc:  Oral  SpO2:  95%  Weight: 98.4 kg (217 lb)    Eyes: PERRL, lids and conjunctivae normal ENMT: Mucous membranes are moist. Posterior pharynx clear of any exudate or lesions. Neck: normal, supple, no masses, no thyromegaly Respiratory: clear to auscultation bilaterally, no wheezing, no crackles. Normal respiratory effort. No accessory muscle use.  Cardiovascular: Regular rate and rhythm, no murmurs / rubs / gallops.  Abdomen: no tenderness, no masses palpated. No hepatosplenomegaly. Bowel sounds positive.  Musculoskeletal: no clubbing / cyanosis. No joint deformity upper and lower extremities.  Skin: no rashes, lesions, ulcers. No induration, Limited exam Neurologic: No facial asymmetry, answers questions appropriately, moves extremities equally Psychiatric:  Alert and Awake. Normal mood.    Labs on Admission: I have personally reviewed following labs and imaging studies  CBC:  Recent Labs Lab 12/16/16 1438  WBC 12.7*  HGB 12.5  HCT 38.3  MCV 90.3  PLT 657*   Basic Metabolic Panel:  Recent Labs Lab 12/16/16 1438  NA 139  K 3.9  CL 102  CO2 26  GLUCOSE 146*  BUN 10  CREATININE 0.52  CALCIUM 9.2   GFR: Estimated Creatinine Clearance:  65 mL/min (by C-G formula based on SCr of 0.52 mg/dL). Liver Function Tests: No results for input(s): AST, ALT, ALKPHOS, BILITOT, PROT, ALBUMIN in the last 168 hours. No results for input(s): LIPASE, AMYLASE in the last 168 hours. No results for input(s): AMMONIA in the last 168 hours. Coagulation Profile: No results for input(s): INR, PROTIME in the last 168 hours. Cardiac Enzymes: No results for input(s): CKTOTAL, CKMB, CKMBINDEX, TROPONINI in the last 168 hours. BNP (last 3 results) No results for input(s): PROBNP in the last 8760 hours. HbA1C: No results for input(s): HGBA1C in the last 72 hours. CBG: No results for input(s): GLUCAP in the last 168 hours. Lipid Profile: No results for input(s): CHOL, HDL, LDLCALC, TRIG, CHOLHDL, LDLDIRECT in the last 72 hours. Thyroid Function Tests: No results for input(s): TSH, T4TOTAL, FREET4, T3FREE, THYROIDAB in the last 72 hours. Anemia Panel: No results for input(s): VITAMINB12, FOLATE, FERRITIN, TIBC, IRON, RETICCTPCT in the last 72 hours. Urine analysis:    Component Value Date/Time   COLORURINE YELLOW 06/24/2014 0808   APPEARANCEUR CLEAR 06/24/2014 0808   LABSPEC 1.011 06/24/2014 0808   PHURINE 5.0 06/24/2014 0808   GLUCOSEU NEGATIVE 06/24/2014 0808   GLUCOSEU NEGATIVE 03/30/2013 1324   HGBUR NEGATIVE 06/24/2014 0808   BILIRUBINUR N 06/25/2016 1255   KETONESUR NEGATIVE 06/24/2014 0808   PROTEINUR 1+ 06/25/2016 1255   PROTEINUR NEGATIVE 06/24/2014 0808   UROBILINOGEN 0.2 06/25/2016 1255   UROBILINOGEN 0.2 06/24/2014 0808   NITRITE N 06/25/2016 1255   NITRITE NEGATIVE 06/24/2014 0808   LEUKOCYTESUR Negative 06/25/2016 1255    Radiological Exams on Admission: Dg Chest 2 View  Result Date: 12/16/2016 CLINICAL DATA:  Pt reports upper left sided chest pain and upper left arm pain since yesterday afternoon. Pt with hx of cardiac stent. Pt reports that the pain is a "spasm" and is a 10/10. Pt reports some SOB with this as well.  EXAM: CHEST  2 VIEW COMPARISON:  05/26/2016 FINDINGS: Mild enlargement of the cardiopericardial silhouette. No mediastinal or hilar masses. No evidence of adenopathy. Mild prominence of the interstitial bronchovascular markings, stable from the prior study. No convincing pulmonary edema. No evidence of pneumonia. No pleural effusion or pneumothorax. Skeletal structures are demineralized but intact. IMPRESSION: No acute cardiopulmonary disease. Electronically Signed   By: Lajean Manes [name]   On: 12/16/2016 14:56    EKG: Independently reviewed. Normal sinus rhythm with no ST elevations or depressions with nonspecific ST-T wave changes  Assessment/Plan Active Problems:   Chest pain - We'll admit for chest pain rule out, obtain troponins every 6 hours surgery, monitor on telemetry, obtain echocardiogram.    Diabetes mellitus without complication ([place]) - We'll continue diabetic diet: House. Place on sliding scale insulin and hold oral hypoglycemic agents    Asthma - Stable no wheezes    Osteoarthritis - Continue home pain medication regimen.    Seasonal allergies - We'll continue Flonase, stable    Ulcerative colitis ([place]) - Stable continue home mesalamine regimen    Chronic diastolic CHF (congestive heart failure) ([place]) - Patient appears compensated continue home medication regimen. Continue Lasix with potassium  replacement    Hyperlipidemia - Stable    DVT prophylaxis: Heparin Code Status: Full Family Communication: Discussed with patient and family member at bedside Disposition Plan: Telemetry while under investigation for chest pain rule out Consults called: none Admission status: obs   Velvet Bathe MD [facility] Pager [phone]  If 7PM-7AM, please contact night-coverage [email or web address] Password [facility]  12/16/2016, 5:49 PM

## 2016-12-16 NOTE — ED Notes (Signed)
Dinner tray ordered; heart healthy, carb modified diet

## 2016-12-16 NOTE — ED Triage Notes (Signed)
Pt with hx of cardiac stent has been having left sided CP with radiation into left arm.  Pt reports that the pain is a "spasm" and is a 10/10.  Pt reports some SOB with this.

## 2016-12-16 NOTE — ED Notes (Signed)
Pt states immediately after she received  Nitro, pain decreased to 4, but now has returned to a 7.

## 2016-12-16 NOTE — ED Provider Notes (Signed)
Emergency Department Provider Note   I have reviewed the triage vital signs and the nursing notes.   HISTORY  Chief Complaint Chest Pain   HPI [name] is a 76 y.o. female with PMH of CAD s/p stent placement in 2001, dCHF, DM, HTN, HLD, and fibromyalgia resents to the emergency department for evaluation of chest pain and left arm pain. Symptoms began yesterday. She notes pain began in her left arm near her bicep. She describes it as severe and like a spasm with squeezing pain. This morning she began having intermittent chest pain along with the arm pain. No exertional or pleuritic component. No associated fevers or chills. No neck pain. No injury to the arm or chest wall. Patient states she did not have significant pain with her heart issue that required stenting. Tried OTC medication and nitro with no relief.   Primary Cardiologist: Dr. Marlou Porch   Past Medical History:  Diagnosis Date  . Adenomatous colon polyp 02/2006  . Allergy, unspecified not elsewhere classified   . Anemia   . Anxiety   . Arthritis   . Bronchiectasis   . CAD (coronary artery disease)    BMS to the LAD, 2002; cath 12/07/11 patent LAD stent and mild nonobstructive disease, EF 65%; Medical management  . CHF (congestive heart failure) (HCC)    Diastolic  . Diabetes mellitus   . Diverticulosis of colon   . DJD (degenerative joint disease)   . Ejection fraction   . Fibromyalgia   . GERD (gastroesophageal reflux disease)   . Hypercholesterolemia   . Hypertension   . Microscopic hematuria   . Neoplasm of kidney   . Obesity   . Other diseases of lung, not elsewhere classified   . Pinched vertebral nerve   . Pulmonary nodule    Negative PET in 2010  . Stress incontinence, female   . Syncope   . Ulcerative colitis, left sided ([place]) 2008   Hx of  . UTI (lower urinary tract infection)   . Vitamin D deficiency disease   . Volume overload     Patient Active Problem List   Diagnosis Date Noted  .  Chest pain 12/16/2016  . Left sided colitis ([place]) 05/13/2015  . Cough 10/24/2013  . CAP (community acquired pneumonia) 10/07/2013  . Feeling grief 08/21/2013  . Ejection fraction   . Diarrhea 08/04/2013  . Back pain, chronic 02/06/2012  . Hyperlipidemia 12/07/2011  . Hematuria 11/27/2011  . Syncope and collapse 11/25/2011  . Ulcerative colitis ([place]) 11/25/2011  . Chronic diastolic CHF (congestive heart failure) ([place]) 11/25/2011  . Lower extremity edema 11/25/2011  . Nonischemic cardiomyopathy ([place]) 04/17/2011  . CAD (coronary artery disease)   . Bronchiectasis ([place])   . Essential hypertension   . PERSONAL HX COLONIC POLYPS 02/08/2010  . VITAMIN D DEFICIENCY 12/03/2009  . BRONCHITIS, ACUTE 02/28/2009  . NAUSEA AND VOMITING 12/06/2008  . Depression 08/10/2008  . PULMONARY NODULE 07/15/2008  . NEOPLASM, KIDNEY 03/16/2008  . MICROSCOPIC HEMATURIA 03/16/2008  . STRESS INCONTINENCE 03/16/2008  . COLONIC POLYPS 07/19/2007  . Diabetes mellitus without complication ([place]) 77/41/2878  . OBESITY 07/19/2007  . ANEMIA 07/19/2007  . Asthma 07/19/2007  . DIVERTICULOSIS OF COLON 07/19/2007  . ANXIETY 03/19/2007  . GERD 03/19/2007  . Osteoarthritis 03/19/2007  . FIBROMYALGIA 03/19/2007  . Allergic state 03/19/2007    Past Surgical History:  Procedure Laterality Date  . ABDOMINAL HYSTERECTOMY    . CARDIAC CATHETERIZATION  11/2011  . CHOLECYSTECTOMY    .  COLONOSCOPY W/ BIOPSIES  11-12-12   per Dr. Fuller Plan, diverticulosis only, repeat in 5 yrs  . CORONARY STENT PLACEMENT  2002   BMS to the LAD  . ESOPHAGOGASTRODUODENOSCOPY  12/2008  . HAND SURGERY  01/2006   Right Hand Surgery by Dr. Amedeo Plenty  . LEFT HEART CATHETERIZATION WITH CORONARY ANGIOGRAM N/A 12/07/2011   Procedure: LEFT HEART CATHETERIZATION WITH CORONARY ANGIOGRAM;  Surgeon: Peter M Martinique, MD;  Location: [facility] CATH LAB;  Service: Cardiovascular;  Laterality: N/A;    Current Outpatient Rx  . Order #: [id number] Class: Historical Med  .  Order #: [id number] Class: Normal  . Order #: [id number] Class: Normal  . Order #: [id number] Class: Print  . Order #: [id number] Class: Normal  . Order #: [id number] Class: Normal  . Order #: [id number] Class: Normal  . Order #: [id number] Class: Historical Med  . Order #: [id number] Class: Normal  . Order #: [id number] Class: Normal  . Order #: [id number] Class: Historical Med  . Order #: [id number] Class: Normal  . Order #: [id number] Class: Normal  . Order #: [id number] Class: Normal  . Order #: [id number] Class: Historical Med  . Order #: [id number] Class: Print  . Order #: [id number] Class: Normal  . Order #: [id number] Class: Print  . Order #: [id number] Class: Normal  . Order #: [id number] Class: Normal  . Order #: [id number] Class: Normal  . Order #: [id number] Class: Historical Med  . Order #: [id number] Class: Normal  . Order #: [id number] Class: Normal  . Order #: [id number] Class: Normal  . Order #: [id number] Class: Normal  . Order #: [id number] Class: Normal  . Order #: [id number] Class: Normal  . Order #: [id number] Class: Normal  . Order #: [id number] Class: Normal  . Order #: [id number] Class: Normal  . Order #: [id number] Class: Historical Med  . Order #: [id number] Class: Normal  . Order #: [id number] Class: Normal  . Order #: [id number] Class: Historical Med  . Order #: [id number] Class: Normal  . Order #: [id number] Class: Normal  . Order #: [id number] Class: Normal  . Order #: [id number] Class: Historical Med  . Order #: [id number] Class: Normal  . Order #: [id number] Class: Normal  . Order #: [id number] Class: Normal  . Order #: [id number] Class: Historical Med  . Order #: [id number] Class: Print  . Order #: [id number] Class: Normal  . Order #: [id number] Class: Print  . Order #: [id number] Class: Normal    Allergies Doxycycline; Amoxicillin; Aspirin; Azithromycin; Caffeine; Ciprofloxacin; Codeine; Sulfamethizole; and Tramadol hcl  Family History  Problem Relation Age of Onset  . Pneumonia Father   . Heart attack Father   .  Parkinsonism Mother   . Sarcoidosis Brother   . Hypertension Sister   . Diabetes Sister   . Breast cancer Daughter   . Colon cancer Neg Hx     Social History Social History  Substance Use Topics  . Smoking status: Former Smoker    Packs/day: 2.00    Years: 15.00    Types: Cigarettes    Quit date: 04/30/1973  . Smokeless tobacco: Never Used     Comment: ex-smoker: smoked for 10-15 years up to 2ppd, quit 1975.  Marland Kitchen Alcohol use 0.0 oz/week     Comment: occ    Review of Systems  Constitutional: No fever/chills Eyes: No visual changes. ENT: No sore throat. Cardiovascular: Positive chest pain and left arm pain.  Respiratory: Denies shortness of breath. Gastrointestinal: No abdominal pain.  No nausea, no vomiting.  No diarrhea.  No constipation. Genitourinary: Negative for dysuria. Musculoskeletal: Negative for back pain. Skin: Negative for rash. Neurological: Negative for headaches, focal weakness or numbness.  10-point ROS otherwise negative.  ____________________________________________   PHYSICAL  EXAM:  VITAL SIGNS: ED Triage Vitals  Enc Vitals Group     BP 12/16/16 1436 (!) 141/82     Pulse Rate 12/16/16 1436 77     Resp 12/16/16 1436 20     Temp 12/16/16 1436 98 F (36.7 C)     Temp Source 12/16/16 1436 Oral     SpO2 12/16/16 1436 95 %     Weight 12/16/16 1433 217 lb (98.4 kg)     Pain Score 12/16/16 1432 10   Constitutional: Alert and oriented. Well appearing and in no acute distress. Eyes: Conjunctivae are normal. Head: Atraumatic. Nose: No congestion/rhinnorhea. Mouth/Throat: Mucous membranes are moist.  Neck: No stridor.   Cardiovascular: Normal rate, regular rhythm. Good peripheral circulation. Grossly normal heart sounds.   Respiratory: Normal respiratory effort.  No retractions. Lungs CTAB. Gastrointestinal: Soft and nontender. No distention.  Musculoskeletal: No lower extremity tenderness nor edema. No gross deformities of extremities. Neurologic:   Normal speech and language. No gross focal neurologic deficits are appreciated.  Skin:  Skin is warm, dry and intact. No rash noted.  ____________________________________________   LABS (all labs ordered are listed, but only abnormal results are displayed)  Labs Reviewed  BASIC METABOLIC PANEL - Abnormal; Notable for the following:       Result Value   Glucose, Bld 146 (*)    All other components within normal limits  CBC - Abnormal; Notable for the following:    WBC 12.7 (*)    Platelets 446 (*)    All other components within normal limits  I-STAT TROPONIN, ED   ____________________________________________  EKG   EKG Interpretation  Date/Time:  Sunday December 16 2016 14:33:15 EDT Ventricular Rate:  88 PR Interval:  176 QRS Duration: 76 QT Interval:  402 QTC Calculation: 486 R Axis:   25 Text Interpretation:  Normal sinus rhythm Right atrial enlargement Nonspecific ST and T wave abnormality Abnormal ECG No STEMI.  Confirmed by Nanda Quinton [phone]) on 12/16/2016 3:59:50 PM       ____________________________________________  RADIOLOGY  Dg Chest 2 View  Result Date: 12/16/2016 CLINICAL DATA:  Pt reports upper left sided chest pain and upper left arm pain since yesterday afternoon. Pt with hx of cardiac stent. Pt reports that the pain is a "spasm" and is a 10/10. Pt reports some SOB with this as well. EXAM: CHEST  2 VIEW COMPARISON:  05/26/2016 FINDINGS: Mild enlargement of the cardiopericardial silhouette. No mediastinal or hilar masses. No evidence of adenopathy. Mild prominence of the interstitial bronchovascular markings, stable from the prior study. No convincing pulmonary edema. No evidence of pneumonia. No pleural effusion or pneumothorax. Skeletal structures are demineralized but intact. IMPRESSION: No acute cardiopulmonary disease. Electronically Signed   By: Lajean Manes [name]   On: 12/16/2016 14:56     ____________________________________________   PROCEDURES  Procedure(s) performed:   Procedures  None ____________________________________________   INITIAL IMPRESSION / ASSESSMENT AND PLAN / ED COURSE  Pertinent labs & imaging results that were available during my care of the patient were reviewed by me and considered in my medical decision making (see chart for details).  Patient presents to the emergency department for evaluation of chest pain and left arm discomfort. Pain is somewhat atypical but patient is very high risk for ACS. No active pain at this time. EKG looks similar to prior. Primary Cardiologist is Dr. Marlou Porch. Plan for admission for observation. Patient reports an allergy to ASA. Toward end of exam began having arm pain so  added Nitro. HEART score 5.   Discussed patient's case with Hospitalist, Dr. Wendee Beavers to request admission. Patient and family (if present) updated with plan. Care transferred to [facility] service.  I reviewed all nursing notes, vitals, pertinent old records, EKGs, labs, imaging (as available).  ____________________________________________  FINAL CLINICAL IMPRESSION(S) / ED DIAGNOSES  Final diagnoses:  Precordial chest pain     MEDICATIONS GIVEN DURING THIS VISIT:  Medications  nitroGLYCERIN (NITROSTAT) SL tablet 0.4 mg (not administered)  insulin aspart (novoLOG) injection 0-15 Units (not administered)  insulin aspart (novoLOG) injection 0-5 Units (not administered)     NEW OUTPATIENT MEDICATIONS STARTED DURING THIS VISIT:  None   Note:  This document was prepared using Dragon voice recognition software and may include unintentional dictation errors.  Nanda Quinton, MD Emergency Medicine    Kole Hilyard, [facility], MD 12/16/16 [phone]

## 2016-12-17 ENCOUNTER — Encounter (HOSPITAL_COMMUNITY): Payer: Self-pay | Admitting: Physician Assistant

## 2016-12-17 ENCOUNTER — Other Ambulatory Visit: Payer: Self-pay | Admitting: Physician Assistant

## 2016-12-17 ENCOUNTER — Telehealth: Payer: Self-pay | Admitting: Family Medicine

## 2016-12-17 DIAGNOSIS — I5032 Chronic diastolic (congestive) heart failure: Secondary | ICD-10-CM | POA: Diagnosis not present

## 2016-12-17 DIAGNOSIS — R0789 Other chest pain: Secondary | ICD-10-CM

## 2016-12-17 DIAGNOSIS — Z9049 Acquired absence of other specified parts of digestive tract: Secondary | ICD-10-CM | POA: Diagnosis not present

## 2016-12-17 DIAGNOSIS — E119 Type 2 diabetes mellitus without complications: Secondary | ICD-10-CM

## 2016-12-17 DIAGNOSIS — R072 Precordial pain: Secondary | ICD-10-CM | POA: Diagnosis not present

## 2016-12-17 DIAGNOSIS — Z87891 Personal history of nicotine dependence: Secondary | ICD-10-CM | POA: Diagnosis not present

## 2016-12-17 DIAGNOSIS — I10 Essential (primary) hypertension: Secondary | ICD-10-CM | POA: Diagnosis not present

## 2016-12-17 DIAGNOSIS — I110 Hypertensive heart disease with heart failure: Secondary | ICD-10-CM | POA: Diagnosis not present

## 2016-12-17 DIAGNOSIS — F419 Anxiety disorder, unspecified: Secondary | ICD-10-CM | POA: Diagnosis not present

## 2016-12-17 DIAGNOSIS — I2510 Atherosclerotic heart disease of native coronary artery without angina pectoris: Secondary | ICD-10-CM | POA: Diagnosis not present

## 2016-12-17 LAB — GLUCOSE, CAPILLARY
GLUCOSE-CAPILLARY: 150 mg/dL — AB (ref 65–99)
Glucose-Capillary: 219 mg/dL — ABNORMAL HIGH (ref 65–99)

## 2016-12-17 LAB — TROPONIN I

## 2016-12-17 MED ORDER — TIZANIDINE HCL 4 MG PO TABS: ORAL_TABLET | ORAL | 11 refills | Status: DC

## 2016-12-17 MED ORDER — FUROSEMIDE 40 MG PO TABS: ORAL_TABLET | ORAL | 3 refills | Status: DC

## 2016-12-17 NOTE — Progress Notes (Signed)
Scheduler's line was persistently busy at the office so I have sent a message to schedulers to help arrange Lexiscan nuclear stress test and f/u. They will call patient with appt. Placed stress test instructions in DC instructions. Aldair Rickel PA-C

## 2016-12-17 NOTE — Discharge Instructions (Signed)
° °  You have a Stress Test scheduled at [facility]. Your doctor has ordered this test to check the blood flow in your heart arteries.  Please arrive 15 minutes early for paperwork. The whole test will take several hours. You may want to bring reading material to remain occupied while undergoing different parts of the test.  Instructions:  No food/drink after midnight the night before.  It is OK to take your morning meds with a sip of water EXCEPT for those types of medicines listed below or otherwise instructed.  No caffeine/decaf products 24 hours before, including medicines such as Excedrin or Goody Powders. Call if there are any questions.   Wear comfortable clothes and shoes.   Special Medication Instructions:  Beta blockers such as metoprolol (Lopressor/Toprol XL), atenolol (Tenormin), carvedilol (Coreg), nebivolol (Bystolic), bisoprolol (Zebeta), propranolol (Inderal) should not be taken for 24 hours before the test.  Calcium channel blockers such as diltiazem (Cardizem) or verapmil (Calan) should not be taken for 24 hours before the test.  Remove nitroglycerin patches and do not take nitrate preparations such as Imdur/isosorbide the day of your test.  No Persantine/Theophylline or Aggrenox medicines should be used within 24 hours of the test.   If you are diabetic, please hold your morning diabetes medicines the day of the test.  What To Expect: When you arrive in the lab, the technician will inject a small amount of radioactive tracer into your arm through an IV while you are resting quietly. This helps [place] to form pictures of your heart. You will likely only feel a sting from the IV. After a waiting period, resting pictures will be obtained under a big camera. These are the "before" pictures.  Next, you will be prepped for the stress portion of the test. This may include either walking on a treadmill or receiving a medicine that helps to dilate blood  vessels in your heart to simulate the effect of exercise on your heart. If you are walking on a treadmill, you will walk at different paces to try to get your heart rate to a goal number that is based on your age. If your doctor has chosen the pharmacologic test, then you will receive a medicine through your IV that may cause temporary nausea, flushing, shortness of breath and sometimes chest discomfort or vomiting. This is typically short-lived and usually resolves quickly. If you experience symptoms, that does not automatically mean the test is abnormal. Some patients do not experience any symptoms at all. Your blood pressure and heart rate will be monitored, and we will be watching your EKG on a computer screen for any changes. During this portion of the test, the radiologist will inject another small amount of radioactive tracer into your IV. After a waiting period, you will undergo a second set of pictures. These are the "after" pictures.  The doctor reading the test will compare the before-and-after images to look for evidence of heart blockages or heart weakness. The test usually takes 1 day to complete, but in certain instances (for example, if a patient is over a certain weight limit), the test may be done over the span of 2 days.

## 2016-12-17 NOTE — Telephone Encounter (Signed)
Noted.

## 2016-12-17 NOTE — Discharge Summary (Signed)
Physician Discharge Summary  [name] XYO:[id number] DOB: 03/13/1941 DOA: 12/16/2016  PCP: [name], MD  Admit date: 12/16/2016 Discharge date: 12/17/2016   Recommendations for Outpatient Follow-Up:   1. Outpatient stress test 2. Ortho referral for arm pain if not improved   Discharge Diagnosis:   Active Problems:   Diabetes mellitus without complication (HCC)   Asthma   Osteoarthritis   Allergic state   Ulcerative colitis ([place])   Chronic diastolic CHF (congestive heart failure) (HCC)   Hyperlipidemia   Chest pain   Discharge disposition:  Home  Discharge Condition: Improved.  Diet recommendation: Low sodium, heart healthy.  Carbohydrate-modified  Wound care: None.   History of Present Illness:   [name] is a 76 y.o. female with medical history significant of CAD with stent placement 2001, ulcerative colitis, congestive heart failure, seasonal allergies, diabetes mellitus. She presents with 2 days complaint of chest discomfort described as a spasm sensation starting from her left arm and traveling to her chest. Nothing she is aware of makes it better or worse. Has some minimal improvement with nitroglycerin. She does not report any activity as the inciting factor of her chest discomfort. The pain comes on sporadically and leaves sporadically as well. The problem has been persistent and as such patient decided to seek further medical evaluation. Per my discussion with patient she had stent placement for CAD in 2001.   Hospital Course by Problem:   Atypical CP  - per cards: outpatient NUC stress test.  HTN  - stable -good control here  CAD -per cardiology    Medical Consultants:    cards   Discharge Exam:   Vitals:   12/17/16 0516 12/17/16 0922  BP: 129/74   Pulse: 69 73  Resp:  18  Temp: 98.4 F (36.9 C)   SpO2: 96% 94%   Vitals:   12/16/16 1815 12/16/16 2008 12/17/16 0516 12/17/16 0922  BP: (!) 151/70 (!) 160/89 129/74     Pulse: 72 69 69 73  Resp: _0 Temp:  98.2 F (36.8 C) 98.4 F (36.9 C)   TempSrc:  Oral Oral   SpO2: 96% 96% 96% 94%  Weight:  99 kg (218 lb 4.8 oz) 96.9 kg (213 lb 9.6 oz)   Height:  5' (1.524 m)      Gen:  NAD   The results of significant diagnostics from this hospitalization (including imaging, microbiology, ancillary and laboratory) are listed below for reference.     Procedures and Diagnostic Studies:   Dg Chest 2 View  Result Date: 12/16/2016 CLINICAL DATA:  Pt reports upper left sided chest pain and upper left arm pain since yesterday afternoon. Pt with hx of cardiac stent. Pt reports that the pain is a "spasm" and is a 10/10. Pt reports some SOB with this as well. EXAM: CHEST  2 VIEW COMPARISON:  05/26/2016 FINDINGS: Mild enlargement of the cardiopericardial silhouette. No mediastinal or hilar masses. No evidence of adenopathy. Mild prominence of the interstitial bronchovascular markings, stable from the prior study. No convincing pulmonary edema. No evidence of pneumonia. No pleural effusion or pneumothorax. Skeletal structures are demineralized but intact. IMPRESSION: No acute cardiopulmonary disease. Electronically Signed   By: Lajean Manes [name]   On: 12/16/2016 14:56     Labs:   Basic Metabolic Panel:  Recent Labs Lab 12/16/16 1438  NA 139  K 3.9  CL 102  CO2 26  GLUCOSE 146*  BUN 10  CREATININE 0.52  CALCIUM 9.2   GFR Estimated Creatinine Clearance: 64.4 mL/min (by C-G formula based on SCr of 0.52 mg/dL). Liver Function Tests: No results for input(s): AST, ALT, ALKPHOS, BILITOT, PROT, ALBUMIN in the last 168 hours. No results for input(s): LIPASE, AMYLASE in the last 168 hours. No results for input(s): AMMONIA in the last 168 hours. Coagulation profile No results for input(s): INR, PROTIME in the last 168 hours.  CBC:  Recent Labs Lab 12/16/16 1438  WBC 12.7*  HGB 12.5  HCT 38.3  MCV 90.3  PLT 446*   Cardiac Enzymes:  Recent  Labs Lab 12/16/16 2058 12/17/16 0125 12/17/16 0831  TROPONINI <0.03 <0.03 <0.03   BNP: Invalid input(s): POCBNP CBG:  Recent Labs Lab 12/16/16 2045 12/17/16 0725  GLUCAP 129* 150*   D-Dimer No results for input(s): DDIMER in the last 72 hours. Hgb A1c No results for input(s): HGBA1C in the last 72 hours. Lipid Profile No results for input(s): CHOL, HDL, LDLCALC, TRIG, CHOLHDL, LDLDIRECT in the last 72 hours. Thyroid function studies No results for input(s): TSH, T4TOTAL, T3FREE, THYROIDAB in the last 72 hours.  Invalid input(s): FREET3 Anemia work up No results for input(s): VITAMINB12, FOLATE, FERRITIN, TIBC, IRON, RETICCTPCT in the last 72 hours. Microbiology No results found for this or any previous visit (from the past 240 hour(s)).   Discharge Instructions:   Discharge Instructions    Diet - low sodium heart healthy    Complete by:  As directed    Diet Carb Modified    Complete by:  As directed    Discharge instructions    Complete by:  As directed    Outpatient stress test, they will call you with appointment Resume home meds as you were taking prior to admission   Increase activity slowly    Complete by:  As directed      Allergies as of 12/17/2016      Reactions   Doxycycline Other (See Comments)   Unsteady gait   Amoxicillin Other (See Comments)   Unknown reaction    Aspirin Nausea And Vomiting   Azithromycin Other (See Comments)   REACTION: pt states "ZPak doesn't work"   Caffeine Nausea And Vomiting   Ciprofloxacin Nausea And Vomiting   Codeine Nausea And Vomiting   Sulfamethizole Other (See Comments)   Unknown reaction   Tramadol Hcl Other (See Comments)   REACTION: pt states "spaced-out"      Medication List    STOP taking these medications   Budesonide 9 MG Tb24 Commonly known as:  UCERIS   diphenoxylate-atropine 2.5-0.025 MG tablet Commonly known as:  LOMOTIL   predniSONE 10 MG tablet Commonly known as:  DELTASONE     TAKE  these medications   ACCU-CHEK AVIVA Soln Perform test as needed   ACCU-CHEK FASTCLIX LANCETS Misc Test once per day and diagnosis code is E 11.9   acetaminophen 500 MG tablet Commonly known as:  TYLENOL Take 500 mg by mouth every 6 (six) hours as needed for headache (pain).   albuterol 108 (90 Base) MCG/ACT inhaler Commonly known as:  VENTOLIN HFA Inhale 2 puffs into the lungs every 4 (four) hours as needed for wheezing or shortness of breath.   albuterol (2.5 MG/3ML) 0.083% nebulizer solution Commonly known as:  PROVENTIL Take 3 mLs (2.5 mg total) by nebulization every 4 (four) hours as needed for wheezing or shortness of breath.   ALPRAZolam 0.5 MG tablet Commonly known as:  XANAX Take 1 tablet (0.5 mg total) by  mouth 3 (three) times daily. What changed:  when to take this  additional instructions   amLODipine 10 MG tablet Commonly known as:  NORVASC Take 1 tablet (10 mg total) by mouth daily.   atenolol 50 MG tablet Commonly known as:  TENORMIN Take 1 tablet (50 mg total) by mouth daily.   atorvastatin 10 MG tablet Commonly known as:  LIPITOR TAKE 1 TABLET BY MOUTH DAILY. What changed:  See the new instructions.   azelastine 0.1 % nasal spray Commonly known as:  ASTELIN Place 1 spray into both nostrils at bedtime.   betamethasone valerate 0.1 % cream Commonly known as:  VALISONE Apply topically 2 (two) times daily.   budesonide-formoterol 160-4.5 MCG/ACT inhaler Commonly known as:  SYMBICORT Inhale 2 puffs into the lungs 2 (two) times daily.   clopidogrel 75 MG tablet Commonly known as:  PLAVIX TAKE 1 TABLET (75 MG TOTAL) BY MOUTH DAILY.   clotrimazole 1 % external solution Commonly known as:  LOTRIMIN Apply 1 application topically 2 (two) times daily. In between toes What changed:  when to take this  additional instructions   diclofenac sodium 1 % Gel Commonly known as:  VOLTAREN Apply 2 g topically 4 (four) times daily.   fluticasone 50  MCG/ACT nasal spray Commonly known as:  FLONASE PLACE 2 SPRAYS IN EACH NOSTRIL AT BEDTIME What changed:  See the new instructions.   furosemide 40 MG tablet Commonly known as:  LASIX TAKE 2 TABLETS (80 MG) BY MOUTH DAILY   glimepiride 1 MG tablet Commonly known as:  AMARYL TAKE 1 TABLET BY MOUTH DAILY BEFORE BREAKFAST. What changed:  See the new instructions.   glucose blood test strip Commonly known as:  ACCU-CHEK AVIVA Test once per day and diagnosis code is E 11.9   HYDROcodone-acetaminophen 10-325 MG tablet Commonly known as:  NORCO Take 1 tablet by mouth 2 (two) times daily. What changed:  Another medication with the same name was removed. Continue taking this medication, and follow the directions you see here.   HYDROcodone-homatropine 5-1.5 MG/5ML syrup Commonly known as:  HYDROMET Take 5 mLs by mouth every 4 (four) hours as needed. What changed:  reasons to take this   hydrocortisone 2.5 % rectal cream Commonly known as:  PROCTOSOL HC Place rectally 3 (three) times daily. What changed:  how much to take  when to take this  reasons to take this   hydrOXYzine 25 MG tablet Commonly known as:  ATARAX/VISTARIL TAKE 1 TABLET EVERY 4 HOURS AS NEEDED FOR ITCHING What changed:  how much to take  how to take this  when to take this  additional instructions   isosorbide mononitrate 60 MG 24 hr tablet Commonly known as:  IMDUR Take 1 tablet (60 mg total) by mouth daily. Please call and schedule November 2018 follow up [phone]   levocetirizine 5 MG tablet Commonly known as:  XYZAL TAKE 1 TABLET (5 MG TOTAL) BY MOUTH EVERY EVENING. What changed:  See the new instructions.   LIDODERM 5 % Generic drug:  lidocaine APPLY 1 PATCH TO SKIN FOR 12 HOURS THEN REMOVE AND DISCARD PATCH WITHIN 72 HOURS OR AS DIRECTED What changed:  See the new instructions.   loperamide 2 MG capsule Commonly known as:  IMODIUM Take 4-6 mg by mouth daily.   losartan 50 MG  tablet Commonly known as:  COZAAR TAKE 1 TABLET BY MOUTH DAILY. What changed:  See the new instructions.   magnesium oxide 400 MG tablet Commonly known  as:  MAG-OX TAKE 1 TABLET BY MOUTH DAILY. What changed:  See the new instructions.   mesalamine 1000 MG suppository Commonly known as:  CANASA Place 1 suppository (1,000 mg total) rectally at bedtime. What changed:  when to take this  reasons to take this   Mesalamine 400 MG Cpdr DR capsule Commonly known as:  DELZICOL Take 4 capsules (1,600 mg total) by mouth 3 (three) times daily. What changed:  Another medication with the same name was changed. Make sure you understand how and when to take each.   metFORMIN 1000 MG tablet Commonly known as:  GLUCOPHAGE Take 1 tablet (1,000 mg total) by mouth 2 (two) times daily with a meal.   montelukast 10 MG tablet Commonly known as:  SINGULAIR TAKE 1 TABLET AT BEDTIME What changed:  See the new instructions.   nabumetone 750 MG tablet Commonly known as:  RELAFEN TAKE 1 TABLET BY MOUTH TWICE A DAY WITH A MEAL What changed:  See the new instructions.   neomycin-polymyxin-hydrocortisone OTIC solution Commonly known as:  CORTISPORIN Use 1 drop in affected ear three times daily as needed What changed:  how much to take  how to take this  when to take this  reasons to take this  additional instructions   nitroGLYCERIN 0.4 MG SL tablet Commonly known as:  NITROSTAT Place 1 tablet (0.4 mg total) under the tongue every 5 (five) minutes as needed for chest pain.   ondansetron 4 MG tablet Commonly known as:  ZOFRAN Take 1 tablet (4 mg total) by mouth every 6 (six) hours as needed for nausea or vomiting.   pantoprazole 40 MG tablet Commonly known as:  PROTONIX TAKE 1 TABLET BY MOUTH TWICE DAILY What changed:  how much to take  how to take this  when to take this  additional instructions   PAZEO 0.7 % Soln Generic drug:  Olopatadine HCl Place 1 drop into both eyes  daily as needed (itching/allergies).   potassium chloride SA 20 MEQ tablet Commonly known as:  KLOR-CON M20 Take 1 tablet (20 mEq total) by mouth 2 (two) times daily. What changed:  when to take this   SYSTANE OP Place 1 drop into both eyes daily. For dry eyes   tiZANidine 4 MG tablet Commonly known as:  ZANAFLEX TAKE 1 TABLET (4 MG TOTAL) BY MOUTH TWICE DAILY   Vitamin D-3 5000 units Tabs Take 5,000 Units by mouth daily.      Follow-up Information    Jerline Pain, MD Follow up.   Specialty:  Cardiology Why:  Office will call you to schedule your stress test and follow-up appointment. See end of After-Visit Summary for Stress Test Instructions! Do not take your atenolol, isosorbide, or diabetic medication the morning of the strest. Contact information: [address] [place] [address] [phone]        [name], MD Follow up in 1 week(s).   Specialty:  Family Medicine Contact information: [place] [address] [phone]            Time coordinating discharge: 35 min  Signed:  JESSICA Alison Stalling   [facility] 12/17/2016, 10:04 AM

## 2016-12-17 NOTE — Consult Note (Signed)
[facility] CM Primary Care Navigator  12/17/2016  [name] 18-Apr-1941 [id number]   Went to seepatient at the bedsideto identify possible discharge needs but she was already discharged per staff report.  Patient was discharged home today.  Primary care provider's officecalled Malachy Mood for [place]) to notify of patient's discharge and need for post hospital follow-up and transition of care.  Made aware of patient's health issues needing follow-up (particularly polypharmacy) and to refer patient to [facility] CM if deemed appropriate for services.  Patient has discharge instruction to follow-up with primary care provider in a week.   For questions, please contact:  Dannielle Huh, BSN, RN- [facility] Primary Care Navigator  Telephone: [phone] [place]

## 2016-12-17 NOTE — Telephone Encounter (Signed)
° ° ° °  [name] with [facility] call to say pt has been discharged and will need Transition Care . Said pt was also having issues with [facility] [phone]

## 2016-12-17 NOTE — Consult Note (Signed)
Cardiology Consultation:   Patient ID: [name]; [id number]; 1941-01-21   Admit date: 12/16/2016 Date of Consult: 12/17/2016  Primary Care Provider: [name], MD Primary Cardiologist: Dr. Marlou Porch  Chief Complaint: left arm pain  Patient Profile:   [name] is a 76 y.o. female with a hx of CAD (BMS to prox LAD 2002), aspirin intolerance (on lifelong Plavix), ulcerative colitis, bronchiectasis, chronic diastolic CHF, DM, fibromyalgia, anxiety, GERD, HTN, HLD, kidney neoplasm followed by urology, obesity whom who is being seen today for the evaluation of left arm pain at the request of Dr. Wendee Beavers (chest pain unit consult identified by charge nurse).  History of Present Illness:   Last 2D echo 10/2011 showed mild LVH, EF 55-60%, no RWMA. Last cath 11/2011 showed tiny D2 with 70% stenosis, 30% mid-distal focal 30% stenosis, 30% prox RCA, 30% mRCA, EF 65% - medical therapy recommended. Per Dr. Marlou Porch' last OV note from 02/2016 she has history of left arm focal pain felt noncardiac.  She reports for the last month she's been feeling left arm spasms in the front of her arm. This can occur intermittently for hours for days on end. She has tried NTG without relief. She has tried Tylenol with only a few minutes of relief. She can no longer lay on the left side due to increased arm pain. She noticed yesterday it was going across her chest a bit so mentioned it to her family who urged her to come to the ER. She denies any significant dyspnea but does admit being very sedentary due to chronic back issues and fatty tumor in her spine. She relays that recently she accidentally parked in the wrong lot and had to walk towards [employer] which was further than usual. She did not have any arm pain during that incident. She felt "tired" when she got into the store but otherwise had no issues. Pain is not worse with lifting arm or palpation. No palpitations. She has ruled out with negative troponins x 3. Labs  remarkable for WBC 12.7, plt 446. CXR NAD. EKG nonacute. Initial SBP mildly elevated, has since been controlled. She states she keeps with chronic mild edema especially in the summer. Weight is down 4lb since February. Prior angina was chest pain relieved with SL NTG.   Past Medical History:  Diagnosis Date  . Adenomatous colon polyp 02/2006  . Allergy, unspecified not elsewhere classified   . Anemia   . Anxiety   . Arthritis   . Bronchiectasis   . CAD (coronary artery disease)    BMS to the LAD, 2002; cath 12/07/11 patent LAD stent and mild nonobstructive disease, EF 65%; Medical management  . CHF (congestive heart failure) (HCC)    Diastolic  . Diabetes mellitus   . Diverticulosis of colon   . DJD (degenerative joint disease)   . Fibromyalgia   . GERD (gastroesophageal reflux disease)   . Hypercholesterolemia   . Hypertension   . Microscopic hematuria   . Neoplasm of kidney   . Obesity   . Other diseases of lung, not elsewhere classified   . Pinched vertebral nerve   . Pulmonary nodule    Negative PET in 2010  . Stress incontinence, female   . Syncope   . Ulcerative colitis, left sided ([facility]) 2008   Hx of  . UTI (lower urinary tract infection)   . Vitamin D deficiency disease     Past Surgical History:  Procedure Laterality Date  . ABDOMINAL HYSTERECTOMY    .  CARDIAC CATHETERIZATION  11/2011  . CHOLECYSTECTOMY    . COLONOSCOPY W/ BIOPSIES  11-12-12   per Dr. Fuller Plan, diverticulosis only, repeat in 5 yrs  . CORONARY STENT PLACEMENT  2002   BMS to the LAD  . ESOPHAGOGASTRODUODENOSCOPY  12/2008  . HAND SURGERY  01/2006   Right Hand Surgery by Dr. Amedeo Plenty  . LEFT HEART CATHETERIZATION WITH CORONARY ANGIOGRAM N/A 12/07/2011   Procedure: LEFT HEART CATHETERIZATION WITH CORONARY ANGIOGRAM;  Surgeon: Peter M Martinique, MD;  Location: [facility] CATH LAB;  Service: Cardiovascular;  Laterality: N/A;     Inpatient Medications: Scheduled Meds: . amLODipine  10 mg Oral Daily  . atenolol  50 mg  Oral Daily  . atorvastatin  10 mg Oral Daily  . clopidogrel  75 mg Oral Daily  . fluticasone  2 spray Each Nare Daily  . furosemide  40 mg Oral Daily  . heparin  5,000 Units Subcutaneous Q8H  . HYDROcodone-acetaminophen  1 tablet Oral BID  . insulin aspart  0-15 Units Subcutaneous TID WC  . insulin aspart  0-5 Units Subcutaneous QHS  . isosorbide mononitrate  60 mg Oral Daily  . losartan  50 mg Oral Daily  . Mesalamine  1,600 mg Oral TID  . mometasone-formoterol  2 puff Inhalation BID  . montelukast  10 mg Oral QHS  . nabumetone  750 mg Oral Daily  . pantoprazole  40 mg Oral BID  . polyvinyl alcohol  1 drop Both Eyes Daily   Continuous Infusions:  PRN Meds: acetaminophen, albuterol, ALPRAZolam, nitroGLYCERIN, olopatadine, ondansetron (ZOFRAN) IV  Allergies:    Allergies  Allergen Reactions  . Doxycycline Other (See Comments)    Unsteady gait  . Amoxicillin Other (See Comments)    Unknown reaction   . Aspirin Nausea And Vomiting  . Azithromycin Other (See Comments)    REACTION: pt states "ZPak doesn't work"  . Caffeine Nausea And Vomiting  . Ciprofloxacin Nausea And Vomiting  . Codeine Nausea And Vomiting  . Sulfamethizole Other (See Comments)    Unknown reaction  . Tramadol Hcl Other (See Comments)    REACTION: pt states "spaced-out"    Social History:   Social History   Social History  . Marital status: Divorced    Spouse name: N/A  . Number of children: 3  . Years of education: N/A   Occupational History  . Retired Retired   Social History Main Topics  . Smoking status: Former Smoker    Packs/day: 2.00    Years: 15.00    Types: Cigarettes    Quit date: 04/30/1973  . Smokeless tobacco: Never Used     Comment: ex-smoker: smoked for 10-15 years up to 2ppd, quit 1975.  Marland Kitchen Alcohol use 0.0 oz/week     Comment: occ  . Drug use: No  . Sexual activity: No   Other Topics Concern  . Not on file   Social History Narrative  . No narrative on file    Family  History:   [name] family history includes Breast cancer in her daughter; Diabetes in her sister; Heart attack in her father; Hypertension in her sister; Parkinsonism in her mother; Pneumonia in her father; Sarcoidosis in her brother. There is no history of Colon cancer.  ROS:  Please see the history of present illness.  All other ROS reviewed and negative.     Physical Exam/Data:   Vitals:   12/16/16 1436 12/16/16 1815 12/16/16 2008 12/17/16 0516  BP: (!) 141/82 (!) 151/70 (!) 160/89  129/74  Pulse: 77 72 69 69  Resp: _0 Temp: 98 F (36.7 C)  98.2 F (36.8 C) 98.4 F (36.9 C)  TempSrc: Oral  Oral Oral  SpO2: 95% 96% 96% 96%  Weight:   218 lb 4.8 oz (99 kg) 213 lb 9.6 oz (96.9 kg)  Height:   5' (1.524 m)     Intake/Output Summary (Last 24 hours) at 12/17/16 0820 Last data filed at 12/17/16 0517  Gross per 24 hour  Intake              380 ml  Output                0 ml  Net              380 ml   Wt Readings from Last 3 Encounters:  12/17/16 213 lb 9.6 oz (96.9 kg)  06/25/16 217 lb (98.4 kg)  05/26/16 204 lb (92.5 kg)    Filed Weights   12/16/16 1433 12/16/16 2008 12/17/16 0516  Weight: 217 lb (98.4 kg) 218 lb 4.8 oz (99 kg) 213 lb 9.6 oz (96.9 kg)   Body mass index is 41.72 kg/m.  General: Well developed, well nourished, in no acute distress. Head: Normocephalic, atraumatic, sclera non-icteric, no xanthomas, nares are without discharge.  Neck: Negative for carotid bruits. JVD not elevated. Lungs: Clear bilaterally to auscultation without wheezes, rales, or rhonchi. Breathing is unlabored. Heart: RRR with S1 S2. No murmurs, rubs, or gallops appreciated. Abdomen: Soft, non-tender, non-distended with normoactive bowel sounds. No hepatomegaly. No rebound/guarding. No obvious abdominal masses. Msk:  Strength and tone appear normal for age. Extremities: No clubbing or cyanosis. No edema.  Distal pedal pulses are 2+ and equal bilaterally. Neuro: Alert and  oriented X 3. No facial asymmetry. No focal deficit. Moves all extremities spontaneously. Psych:  Responds to questions appropriately with a normal affect.  EKG:  The EKG was personally reviewed and demonstrates NSR 70bpm nonspecific TWI I, avL - seen on prior tracings  Relevant CV Studies: Referenced above  Laboratory Data:  Chemistry Recent Labs Lab 12/16/16 1438  NA 139  K 3.9  CL 102  CO2 26  GLUCOSE 146*  BUN 10  CREATININE 0.52  CALCIUM 9.2  GFRNONAA >60  GFRAA >60  ANIONGAP 11     Hematology Recent Labs Lab 12/16/16 1438  WBC 12.7*  RBC 4.24  HGB 12.5  HCT 38.3  MCV 90.3  MCH 29.5  MCHC 32.6  RDW 14.5  PLT 446*   Cardiac Enzymes Recent Labs Lab 12/16/16 2058 12/17/16 0125  TROPONINI <0.03 <0.03    Recent Labs Lab 12/16/16 1448  TROPIPOC 0.01   Radiology/Studies:  Dg Chest 2 View  Result Date: 12/16/2016 CLINICAL DATA:  Pt reports upper left sided chest pain and upper left arm pain since yesterday afternoon. Pt with hx of cardiac stent. Pt reports that the pain is a "spasm" and is a 10/10. Pt reports some SOB with this as well. EXAM: CHEST  2 VIEW COMPARISON:  05/26/2016 FINDINGS: Mild enlargement of the cardiopericardial silhouette. No mediastinal or hilar masses. No evidence of adenopathy. Mild prominence of the interstitial bronchovascular markings, stable from the prior study. No convincing pulmonary edema. No evidence of pneumonia. No pleural effusion or pneumothorax. Skeletal structures are demineralized but intact. IMPRESSION: No acute cardiopulmonary disease. Electronically Signed   By: Lajean Manes [name]   On: 12/16/2016 14:56    Assessment and Plan:   1.  Left arm/chest pain - mostly atypical features, has ruled out for MI despite sometimes days' worth of pain, worse lying on left side. Question rotator cuff or shoulder pathology, would consider referral to orthopedics if she doesn't already see one. I will review further w/u with MD. Last  ischemic assessment was in 2013.  2. CAD - she is maintained on lifelong Plavix. Continue BB, amlodipine, Imdur.  3. Chronic diastolic CHF - patient believes she feels at baseline.  4. Essential HTN - BP mildly elevated on admission but controlled now. Follow.  5. Hyperlipidemia - continue statin.  6. Abnormal CBC - per IM.  7. Polypharmacy- patient is on over 40 medications (some PRN). Given advanced age, at risk for polypharmacy. Consider scaling back med list at DC to essentials if appropriate.  Signed, Charlie Pitter, PA-C  12/17/2016 8:20 AM  Personally seen and examined. Agree with above.  Former Dr. Ron Parker patient with known CAD and cathed last in 2013. Her pain is quite atypical, left arm when laying pain. Has seen ortho in the past, has fallen. Trop neg, ECG with no ischemic changes.   GEN: Well nourished, well developed, in no acute distress, overweight HEENT: normal  Neck: no JVD, carotid bruits, or masses Cardiac: RRR; no murmurs, rubs, or gallops,no edema  Respiratory:  clear to auscultation bilaterally, normal work of breathing GI: soft, nontender, nondistended, + BS MS: no deformity or atrophy  Skin: warm and dry, no rash Neuro:  Alert and Oriented x 3, Strength and sensation are intact Psych: euthymic mood, full affect  Atypical CP  - ok with DC home with outpatient NUC stress test. Will set up.   - continue MSK treatments/ ortho if needed as outpatient.   HTN  - stable, meds reviewed  CAD  - prior cath reviewed. Known LAD stenting.   - secondary prevention  Candee Furbish, MD

## 2016-12-19 ENCOUNTER — Telehealth: Payer: Self-pay

## 2016-12-19 NOTE — Telephone Encounter (Signed)
LMTCB

## 2016-12-20 NOTE — Telephone Encounter (Signed)
D/C 12/17/16 To: home  Spoke with patient and she states that she is doing well but is still tired. She is in the process of moving but is being very careful to not over-exert herself. She is mindful of her physical limitations.    She declines to make follow up at this time as she has "too many other appts right now". Advised pt to contact office if she would like to be seen.   Dr. Sarajane Jews - [facility]   Transition Care Management Follow-up Telephone Call  How have you been since you were released from the hospital? Good, still tired   Do you understand why you were in the hospital? yes   Do you understand the discharge instrcutions? yes  Items Reviewed:  Medications reviewed: yes, pt was unaware she had rx's prescribed at d/c  Allergies reviewed: yes  Dietary changes reviewed: yes  Referrals reviewed: yes   Functional Questionnaire:   Activities of Daily Living (ADLs):   She states they are independent in the following: ambulation, bathing and hygiene, feeding, continence, grooming, toileting and dressing States they require assistance with the following: none   Any transportation issues/concerns?: no   Any patient concerns? no   Confirmed importance and date/time of follow-up visits scheduled: yes   Confirmed with patient if condition begins to worsen call PCP or go to the ER.  Patient was given the Call-a-Nurse line [phone]: yes

## 2016-12-21 NOTE — Telephone Encounter (Signed)
Noted

## 2016-12-25 ENCOUNTER — Telehealth (HOSPITAL_COMMUNITY): Payer: Self-pay | Admitting: *Deleted

## 2016-12-25 NOTE — Telephone Encounter (Signed)
Patient given detailed instructions per Myocardial Perfusion Study Information Sheet for the test on 12/27/16. Patient notified to arrive 15 minutes early and that it is imperative to arrive on time for appointment to keep from having the test rescheduled.  If you need to cancel or reschedule your appointment, please call the office within 24 hours of your appointment. . Patient verbalized understanding. Kirstie Peri

## 2016-12-27 ENCOUNTER — Ambulatory Visit (HOSPITAL_COMMUNITY): Payer: Medicare Other | Attending: Cardiovascular Disease

## 2016-12-27 DIAGNOSIS — R0789 Other chest pain: Secondary | ICD-10-CM | POA: Insufficient documentation

## 2016-12-27 LAB — MYOCARDIAL PERFUSION IMAGING
CHL CUP NUCLEAR SSS: 4
LVDIAVOL: 103 mL (ref 46–106)
LVSYSVOL: 36 mL
NUC STRESS TID: 0.9
Peak HR: 85 {beats}/min
RATE: 0.3
Rest HR: 60 {beats}/min
SDS: 1
SRS: 3

## 2016-12-27 MED ORDER — TECHNETIUM TC 99M TETROFOSMIN IV KIT
32.5000 | PACK | Freq: Once | INTRAVENOUS | Status: AC | PRN
  Administered 2016-12-27: 32.5 via INTRAVENOUS
  Filled 2016-12-27: qty 33

## 2016-12-27 MED ORDER — TECHNETIUM TC 99M TETROFOSMIN IV KIT
10.1000 | PACK | Freq: Once | INTRAVENOUS | Status: AC | PRN
  Administered 2016-12-27: 10.1 via INTRAVENOUS
  Filled 2016-12-27: qty 11

## 2016-12-27 MED ORDER — REGADENOSON 0.4 MG/5ML IV SOLN
0.4000 mg | Freq: Once | INTRAVENOUS | Status: AC
  Administered 2016-12-27: 0.4 mg via INTRAVENOUS

## 2017-01-03 ENCOUNTER — Encounter: Payer: Self-pay | Admitting: Family Medicine

## 2017-01-03 ENCOUNTER — Ambulatory Visit (INDEPENDENT_AMBULATORY_CARE_PROVIDER_SITE_OTHER): Payer: Medicare Other | Admitting: Family Medicine

## 2017-01-03 VITALS — BP 138/80 | Temp 98.0°F | Ht 60.0 in | Wt 216.0 lb

## 2017-01-03 DIAGNOSIS — M150 Primary generalized (osteo)arthritis: Secondary | ICD-10-CM

## 2017-01-03 DIAGNOSIS — M8949 Other hypertrophic osteoarthropathy, multiple sites: Secondary | ICD-10-CM

## 2017-01-03 DIAGNOSIS — M797 Fibromyalgia: Secondary | ICD-10-CM

## 2017-01-03 DIAGNOSIS — M159 Polyosteoarthritis, unspecified: Secondary | ICD-10-CM

## 2017-01-03 DIAGNOSIS — R079 Chest pain, unspecified: Secondary | ICD-10-CM | POA: Diagnosis not present

## 2017-01-03 MED ORDER — HYDROCODONE-ACETAMINOPHEN 10-325 MG PO TABS: 1.0000 | ORAL_TABLET | Freq: Four times a day (QID) | ORAL | 0 refills | Status: DC | PRN

## 2017-01-03 MED ORDER — NABUMETONE 750 MG PO TABS: ORAL_TABLET | ORAL | 3 refills | Status: DC

## 2017-01-03 MED ORDER — GABAPENTIN 100 MG PO CAPS: 100.0000 mg | ORAL_CAPSULE | Freq: Three times a day (TID) | ORAL | 0 refills | Status: DC

## 2017-01-03 NOTE — Patient Instructions (Signed)
WE NOW OFFER   [facility] FAST TRACK!!!  SAME DAY Appointments for ACUTE CARE  Such as: Sprains, Injuries, cuts, abrasions, rashes, muscle pain, joint pain, back pain Colds, flu, sore throats, headache, allergies, cough, fever  Ear pain, sinus and eye infections Abdominal pain, nausea, vomiting, diarrhea, upset stomach Animal/insect bites  3 Easy Ways to Schedule: Walk-In Scheduling Call in scheduling Mychart Sign-up: [email or web address]         

## 2017-01-03 NOTE — Progress Notes (Signed)
   Subjective:    Patient ID: [name], female    DOB: 16-Mar-1941, 76 y.o.   MRN: [id number]  HPI Here to follow up a hospital stay from 12-16-16 to 12-17-16 for chest pain and left arm pain. This was not accompanied by SOB or nausea, and it was not related to exertion. Her exam and labs and CXR were all normal. She then had a gated nuclear stress test on 12-27-16 which was negative for ischemia and showed an EF of 65%. Since then she has felt fine other than having migrating muscle pains over the entire body. She had been diagnosed with fibromyalgia years ago. Apparently her recent chest and arm pains were a manifestation of this problem. She normally takes Nabumetone or Vicodin for the pain but this no longer helps much.    Review of Systems  Constitutional: Negative.   Respiratory: Negative.   Cardiovascular: Positive for chest pain. Negative for palpitations and leg swelling.  Gastrointestinal: Negative.   Musculoskeletal: Positive for myalgias.  Neurological: Negative.        Objective:   Physical Exam  Constitutional: She is oriented to person, place, and time. She appears well-developed and well-nourished. No distress.  Neck: No thyromegaly present.  Cardiovascular: Normal rate, regular rhythm, normal heart sounds and intact distal pulses.   Pulmonary/Chest: Effort normal and breath sounds normal. No respiratory distress. She has no wheezes. She has no rales.  Lymphadenopathy:    She has no cervical adenopathy.  Neurological: She is alert and oriented to person, place, and time.          Assessment & Plan:  She is following up on an episode of chest pain which turns out to be from fibromyalgia. She is scheduled to follow up with Cardiology on 01-17-17. For the fibromyalgia pains she will try Gabapentin 100 mg TID. We can titrate up on the dose if needed. She will use this in addition to Nabumetone BID and prn hydrocodone.  Alysia Penna, MD

## 2017-01-16 NOTE — Progress Notes (Signed)
Cardiology Office Note   Date:  01/18/2017   ID:  [name], DOB Apr 20, 1941, MRN [id number]  PCP:  [name], MD  Cardiologist:  Dr. Marlou Porch    Chief Complaint  Patient presents with  . Hospitalization Follow-up      History of Present Illness: [name] is a 76 y.o. female who presents for for post hospitalization.   She has a hx of CAD (BMS to prox LAD 2002), aspirin intolerance (on lifelong Plavix), ulcerative colitis, bronchiectasis, chronic diastolic CHF, DM, fibromyalgia, anxiety, GERD, HTN, HLD, kidney neoplasm followed by urology, obesity.   In 2002 had an BMS Express 3.5 x 20 mm stent placed in the proximal LAD with 3 subsequent catheterization showing patent stent here for follow-up.  Physical history of not being able to tolerate aspirin. Stomach pain.  She remains on Plavix long-term.  Left arm focal pain sometimes, right as well. Likely noncardiac. She also discussed occasional right-sided jaw pain at rest. Nonexertional. This seems to be relieved when she stretches surgical back. Lives with son and his wife. Son encourages NTG use.   Has ulcerative colitis.   Last 2D echo 10/2011 showed mild LVH, EF 55-60%, no RWMA. Last cath 11/2011 showed tiny D2 with 70% stenosis, 30% mid-distal focal 30% stenosis, 30% prox RCA, 30% mRCA, EF 65% - medical therapy recommended. Per Dr. Marlou Porch' last OV note from 02/2016 she has history of left arm focal pain felt noncardiac.  She had nuc study neg for ischemia for atypical chest pain. tropoin neg .   Today she denies any chest pain, no SOB.  Multiple pains from fibromyalgia. And back pain.  She still has shoulder pain but will see ortho.  Her LDL in Feb was 64, HDL is 52.   BP is good.  Overall she does feel better.     Past Medical History:  Diagnosis Date  . Adenomatous colon polyp 02/2006  . Allergy, unspecified not elsewhere classified   . Anemia   . Anxiety   . Arthritis   . Bronchiectasis   . CAD  (coronary artery disease)    BMS to the LAD, 2002; cath 12/07/11 patent LAD stent and mild nonobstructive disease, EF 65%; Medical management  . CHF (congestive heart failure) (HCC)    Diastolic  . Diabetes mellitus   . Diverticulosis of colon   . DJD (degenerative joint disease)   . Fibromyalgia   . GERD (gastroesophageal reflux disease)   . Hypercholesterolemia   . Hypertension   . Microscopic hematuria   . Neoplasm of kidney   . Obesity   . Other diseases of lung, not elsewhere classified   . Pinched vertebral nerve   . Pulmonary nodule    Negative PET in 2010  . Stress incontinence, female   . Syncope   . Ulcerative colitis, left sided ([place]) 2008   Hx of  . UTI (lower urinary tract infection)   . Vitamin D deficiency disease     Past Surgical History:  Procedure Laterality Date  . ABDOMINAL HYSTERECTOMY    . CARDIAC CATHETERIZATION  11/2011  . CHOLECYSTECTOMY    . COLONOSCOPY W/ BIOPSIES  11-12-12   per Dr. Fuller Plan, diverticulosis only, repeat in 5 yrs  . CORONARY STENT PLACEMENT  2002   BMS to the LAD  . ESOPHAGOGASTRODUODENOSCOPY  12/2008  . HAND SURGERY  01/2006   Right Hand Surgery by Dr. Amedeo Plenty  . LEFT HEART CATHETERIZATION WITH CORONARY ANGIOGRAM N/A 12/07/2011   Procedure: LEFT  HEART CATHETERIZATION WITH CORONARY ANGIOGRAM;  Surgeon: Peter M Martinique, MD;  Location: [facility] CATH LAB;  Service: Cardiovascular;  Laterality: N/A;     Current Outpatient Prescriptions  Medication Sig Dispense Refill  . ACCU-CHEK FASTCLIX LANCETS MISC Test once per day and diagnosis code is E 11.9 102 each 1  . acetaminophen (TYLENOL) 500 MG tablet Take 500 mg by mouth every 6 (six) hours as needed for headache (pain).     Marland Kitchen albuterol (PROVENTIL) (2.5 MG/3ML) 0.083% nebulizer solution Take 3 mLs (2.5 mg total) by nebulization every 4 (four) hours as needed for wheezing or shortness of breath. 75 mL 11  . albuterol (VENTOLIN HFA) 108 (90 BASE) MCG/ACT inhaler Inhale 2 puffs into the lungs every 4  (four) hours as needed for wheezing or shortness of breath. 18 each 11  . ALPRAZolam (XANAX) 0.5 MG tablet Take 1 tablet (0.5 mg total) by mouth 3 (three) times daily. 90 tablet 5  . amLODipine (NORVASC) 10 MG tablet Take 1 tablet (10 mg total) by mouth daily. 90 tablet 0  . atenolol (TENORMIN) 50 MG tablet Take 1 tablet (50 mg total) by mouth daily. 90 tablet 3  . atorvastatin (LIPITOR) 10 MG tablet TAKE 1 TABLET BY MOUTH DAILY. 90 tablet 3  . azelastine (ASTELIN) 0.1 % nasal spray Place 1 spray into both nostrils at bedtime.    . betamethasone valerate (VALISONE) 0.1 % cream Apply topically 2 (two) times daily. 30 g 2  . budesonide-formoterol (SYMBICORT) 160-4.5 MCG/ACT inhaler Inhale 2 puffs into the lungs 2 (two) times daily.    . Cholecalciferol (VITAMIN D-3) 5000 UNITS TABS Take 5,000 Units by mouth daily.      . clopidogrel (PLAVIX) 75 MG tablet TAKE 1 TABLET (75 MG TOTAL) BY MOUTH DAILY. 90 tablet 1  . clotrimazole (LOTRIMIN) 1 % external solution Apply 1 application topically 2 (two) times daily. In between toes 60 mL 5  . diclofenac sodium (VOLTAREN) 1 % GEL Apply 2 g topically 4 (four) times daily.    . fluticasone (FLONASE) 50 MCG/ACT nasal spray PLACE 2 SPRAYS IN EACH NOSTRIL AT BEDTIME 16 g 6  . furosemide (LASIX) 40 MG tablet TAKE 2 TABLETS (80 MG) BY MOUTH DAILY 270 tablet 3  . gabapentin (NEURONTIN) 100 MG capsule Take 1 capsule (100 mg total) by mouth 3 (three) times daily. 90 capsule 0  . glimepiride (AMARYL) 1 MG tablet TAKE 1 TABLET BY MOUTH DAILY BEFORE BREAKFAST. 90 tablet 3  . glucose blood (ACCU-CHEK AVIVA) test strip Test once per day and diagnosis code is E 11.9 100 each 1  . HYDROcodone-acetaminophen (NORCO) 10-325 MG tablet Take 1 tablet by mouth every 6 (six) hours as needed for moderate pain. 120 tablet 0  . HYDROcodone-homatropine (HYDROMET) 5-1.5 MG/5ML syrup Take 5 mLs by mouth every 4 (four) hours as needed. 240 mL 0  . hydrocortisone (PROCTOSOL HC) 2.5 % rectal  cream Place rectally 3 (three) times daily. 28.35 g 3  . hydrOXYzine (ATARAX/VISTARIL) 25 MG tablet TAKE 1 TABLET EVERY 4 HOURS AS NEEDED FOR ITCHING 50 tablet 5  . isosorbide mononitrate (IMDUR) 60 MG 24 hr tablet Take 1 tablet (60 mg total) by mouth daily. Please call and schedule November 2018 follow up [phone] 90 tablet 0  . levocetirizine (XYZAL) 5 MG tablet TAKE 1 TABLET (5 MG TOTAL) BY MOUTH EVERY EVENING. 90 tablet 3  . LIDODERM 5 % APPLY 1 PATCH TO SKIN FOR 12 HOURS THEN REMOVE AND DISCARD PATCH WITHIN  72 HOURS OR AS DIRECTED 30 patch 2  . loperamide (IMODIUM) 2 MG capsule Take 4-6 mg by mouth daily.    Marland Kitchen losartan (COZAAR) 50 MG tablet TAKE 1 TABLET BY MOUTH DAILY. 90 tablet 3  . magnesium oxide (MAG-OX) 400 MG tablet TAKE 1 TABLET BY MOUTH DAILY. 90 tablet 0  . mesalamine (CANASA) 1000 MG suppository Place 1 suppository (1,000 mg total) rectally at bedtime. 30 suppository 3  . Mesalamine (DELZICOL) 400 MG CPDR DR capsule Take 4 capsules (1,600 mg total) by mouth 3 (three) times daily. 360 capsule 11  . metFORMIN (GLUCOPHAGE) 1000 MG tablet Take 1 tablet (1,000 mg total) by mouth 2 (two) times daily with a meal. 180 tablet 3  . montelukast (SINGULAIR) 10 MG tablet TAKE 1 TABLET AT BEDTIME 90 tablet 1  . nabumetone (RELAFEN) 750 MG tablet TAKE 1 TABLET (750 MG) BY MOUTH TWICE A DAY WITH A MEAL 180 tablet 3  . neomycin-polymyxin-hydrocortisone (CORTISPORIN) otic solution Use 1 drop in affected ear three times daily as needed 10 mL 0  . nitroGLYCERIN (NITROSTAT) 0.4 MG SL tablet Place 1 tablet (0.4 mg total) under the tongue every 5 (five) minutes as needed for chest pain. 25 tablet 1  . Olopatadine HCl (PAZEO) 0.7 % SOLN Place 1 drop into both eyes daily as needed (itching/allergies).    . ondansetron (ZOFRAN) 4 MG tablet Take 1 tablet (4 mg total) by mouth every 6 (six) hours as needed for nausea or vomiting. 40 tablet 0  . pantoprazole (PROTONIX) 40 MG tablet TAKE 1 TABLET BY MOUTH  TWICE DAILY 180 tablet 3  . Polyethyl Glycol-Propyl Glycol (SYSTANE OP) Place 1 drop into both eyes daily. For dry eyes    . potassium chloride SA (K-DUR,KLOR-CON) 20 MEQ tablet Take 20 mEq by mouth 2 (two) times daily.    Marland Kitchen tiZANidine (ZANAFLEX) 4 MG tablet TAKE 1 TABLET (4 MG TOTAL) BY MOUTH TWICE DAILY 90 tablet 11   Current Facility-Administered Medications  Medication Dose Route Frequency Provider Last Rate Last Dose  . ipratropium-albuterol (DUONEB) 0.5-2.5 (3) MG/3ML nebulizer solution 3 mL  3 mL Nebulization Once Dorothyann Peng, NP        Allergies:   Doxycycline; Amoxicillin; Aspirin; Azithromycin; Caffeine; Ciprofloxacin; Codeine; Sulfamethizole; and Tramadol hcl    Social History:  The patient  reports that she quit smoking about 43 years ago. Her smoking use included Cigarettes. She has a 30.00 pack-year smoking history. She has never used smokeless tobacco. She reports that she drinks alcohol. She reports that she does not use drugs.   Family History:  [name] family history includes Breast cancer in her daughter; Diabetes in her brother and sister; Heart attack in her father; Hypertension in her sister; Parkinsonism in her mother; Pneumonia in her father; Sarcoidosis in her brother.    ROS:  General:no colds or fevers, no weight changes Skin:no rashes or ulcers HEENT:no blurred vision, no congestion CV:see HPI PUL:see HPI GI:no diarrhea constipation or melena, no indigestion GU:no hematuria, no dysuria MS:no joint pain, no claudication Neuro:no syncope, no lightheadedness Endo:+ diabetes stable with hgbA1c of 6.8 , no thyroid disease  Wt Readings from Last 3 Encounters:  01/17/17 211 lb (95.7 kg)  01/03/17 216 lb (98 kg)  12/27/16 213 lb (96.6 kg)     PHYSICAL EXAM: VS:  BP 120/70   Pulse 78   Ht 5' (1.524 m)   Wt 211 lb (95.7 kg)   LMP  (LMP Unknown)   SpO2  91%   BMI 41.21 kg/m  , BMI Body mass index is 41.21 kg/m. General:Pleasant affect,  NAD Skin:Warm and dry, brisk capillary refill HEENT:normocephalic, sclera clear, mucus membranes moist Neck:supple, no JVD, no bruits  Heart:S1S2 RRR without murmur, gallup, rub or click Lungs:clear without rales, rhonchi, or wheezes EML:JQGB, non tender, + BS, do not palpate liver spleen or masses Ext:no lower ext edema, 2+ pedal pulses, 2+ radial pulses Neuro:alert and oriented, MAE, follows commands, + facial symmetry    EKG:  EKG is NOT ordered today.   Recent Labs: 06/25/2016: ALT 5; TSH 1.08 12/16/2016: BUN 10; Creatinine, Ser 0.52; Hemoglobin 12.5; Platelets 446; Potassium 3.9; Sodium 139    Lipid Panel    Component Value Date/Time   CHOL 136 06/25/2016 1015   TRIG 100.0 06/25/2016 1015   HDL 52.50 06/25/2016 1015   CHOLHDL 3 06/25/2016 1015   VLDL 20.0 06/25/2016 1015   LDLCALC 64 06/25/2016 1015       Other studies Reviewed: Additional studies/ records that were reviewed today include:  NUC study 12/27/16  . Study Highlights    The left ventricular ejection fraction is normal (55-65%).  Nuclear stress EF: 65%.  There was no ST segment deviation noted during stress.  No T wave inversion was noted during stress.  Defect 1: There is a small defect of moderate severity present in the basal anterior and mid anterior location. This was consistent with artifact.  The study is normal.  This is a low risk study.       ASSESSMENT AND PLAN:  1.  Lt arm and chest pain, with hospitalization, neg MI, Nuc study was neg for ischemia.  She plans to see ortho soon.  Follow up with Dr. Marlou Porch in 6 months   2.  CAD on lifelong plavix, on BB amlodipine, imdur.  3.  Chronic diastolic HF euvolemic  4.  HTN stable today was elevated in hospital  5.  HLD controlled in Feb continue statin.     Current medicines are reviewed with the patient today.  The patient Has no concerns regarding medicines.  The following changes have been made:  See above Labs/ tests ordered  today include:see above  Disposition:   FU:  see above  Signed, Cecilie Kicks, NP  01/18/2017 9:02 PM    [address], [place], [place], [place] Phone: [phone]; Fax: [phone]

## 2017-01-17 ENCOUNTER — Encounter: Payer: Self-pay | Admitting: Cardiology

## 2017-01-17 ENCOUNTER — Ambulatory Visit (INDEPENDENT_AMBULATORY_CARE_PROVIDER_SITE_OTHER): Payer: Medicare Other | Admitting: Cardiology

## 2017-01-17 VITALS — BP 120/70 | HR 78 | Ht 60.0 in | Wt 211.0 lb

## 2017-01-17 DIAGNOSIS — I2583 Coronary atherosclerosis due to lipid rich plaque: Secondary | ICD-10-CM | POA: Diagnosis not present

## 2017-01-17 DIAGNOSIS — I10 Essential (primary) hypertension: Secondary | ICD-10-CM

## 2017-01-17 DIAGNOSIS — E7800 Pure hypercholesterolemia, unspecified: Secondary | ICD-10-CM

## 2017-01-17 DIAGNOSIS — I5032 Chronic diastolic (congestive) heart failure: Secondary | ICD-10-CM

## 2017-01-17 DIAGNOSIS — I2510 Atherosclerotic heart disease of native coronary artery without angina pectoris: Secondary | ICD-10-CM | POA: Diagnosis not present

## 2017-01-17 DIAGNOSIS — R0789 Other chest pain: Secondary | ICD-10-CM

## 2017-01-17 NOTE — Patient Instructions (Addendum)
Medication Instructions: Your physician recommends that you continue on your current medications as directed. Please refer to the Current Medication list given to you today.  Labwork: None Ordered  Procedures/Testing: None Ordered  Follow-Up: Your physician recommends that you schedule a follow-up appointment in November or December with Dr. Marlou Porch .  If you need a refill on your cardiac medications before your next appointment, please call your pharmacy.

## 2017-01-26 ENCOUNTER — Other Ambulatory Visit: Payer: Self-pay | Admitting: Family Medicine

## 2017-01-30 ENCOUNTER — Other Ambulatory Visit: Payer: Self-pay | Admitting: Family Medicine

## 2017-01-30 NOTE — Telephone Encounter (Signed)
Refill request for Gabapentin 100 mg take 1 po tid and a 90 day supply to [facility].

## 2017-01-31 MED ORDER — GABAPENTIN 100 MG PO CAPS: 100.0000 mg | ORAL_CAPSULE | Freq: Three times a day (TID) | ORAL | 0 refills | Status: DC

## 2017-01-31 NOTE — Telephone Encounter (Signed)
I sent script e-scribe to [facility].

## 2017-02-05 ENCOUNTER — Encounter: Payer: Self-pay | Admitting: Sports Medicine

## 2017-02-05 ENCOUNTER — Ambulatory Visit (INDEPENDENT_AMBULATORY_CARE_PROVIDER_SITE_OTHER): Payer: Medicare Other | Admitting: Sports Medicine

## 2017-02-05 DIAGNOSIS — M79672 Pain in left foot: Secondary | ICD-10-CM

## 2017-02-05 DIAGNOSIS — M79671 Pain in right foot: Secondary | ICD-10-CM

## 2017-02-05 DIAGNOSIS — B359 Dermatophytosis, unspecified: Secondary | ICD-10-CM

## 2017-02-05 DIAGNOSIS — E1151 Type 2 diabetes mellitus with diabetic peripheral angiopathy without gangrene: Secondary | ICD-10-CM

## 2017-02-05 DIAGNOSIS — E119 Type 2 diabetes mellitus without complications: Secondary | ICD-10-CM | POA: Diagnosis not present

## 2017-02-05 DIAGNOSIS — L309 Dermatitis, unspecified: Secondary | ICD-10-CM

## 2017-02-05 DIAGNOSIS — B351 Tinea unguium: Secondary | ICD-10-CM

## 2017-02-05 MED ORDER — CLOTRIMAZOLE 1 % EX SOLN: 1.0000 "application " | Freq: Two times a day (BID) | CUTANEOUS | 5 refills | Status: DC

## 2017-02-05 MED ORDER — BETAMETHASONE VALERATE 0.1 % EX CREA: TOPICAL_CREAM | Freq: Two times a day (BID) | CUTANEOUS | 2 refills | Status: DC

## 2017-02-05 NOTE — Progress Notes (Signed)
Patient ID: [name], female   DOB: [birth year], 76 y.o.   MRN: [id number]  Subjective: [name] is a 76 y.o. female patient with history of diabetes who returns to office today complaining of long, painful nails  while ambulating in shoes; unable to trim.  Patient states that the glucose reading this morning was not recorded today but has been "good". Patient denies any new changes in medication or any other new problems. Patient denies any new cramping, numbness, burning or tingling in the legs.Wants refill on cream.   Patient Active Problem List   Diagnosis Date Noted  . Fibromyalgia 01/03/2017  . Left sided colitis ([place]) 05/13/2015  . Diarrhea 08/04/2013  . Back pain, chronic 02/06/2012  . Hyperlipidemia 12/07/2011  . Hematuria 11/27/2011  . Ulcerative colitis ([place]) 11/25/2011  . Chronic diastolic CHF (congestive heart failure) ([place]) 11/25/2011  . Lower extremity edema 11/25/2011  . Nonischemic cardiomyopathy ([place]) 04/17/2011  . CAD (coronary artery disease)   . Bronchiectasis ([place])   . Essential hypertension   . VITAMIN D DEFICIENCY 12/03/2009  . Depression 08/10/2008  . PULMONARY NODULE 07/15/2008  . NEOPLASM, KIDNEY 03/16/2008  . STRESS INCONTINENCE 03/16/2008  . COLONIC POLYPS 07/19/2007  . Diabetes mellitus without complication ([place]) 56/38/7564  . OBESITY 07/19/2007  . ANEMIA 07/19/2007  . Asthma 07/19/2007  . DIVERTICULOSIS OF COLON 07/19/2007  . ANXIETY 03/19/2007  . GERD 03/19/2007  . Osteoarthritis 03/19/2007  . FIBROMYALGIA 03/19/2007  . Allergic state 03/19/2007   Current Outpatient Prescriptions on File Prior to Visit  Medication Sig Dispense Refill  . ACCU-CHEK FASTCLIX LANCETS MISC Test once per day and diagnosis code is E 11.9 102 each 1  . acetaminophen (TYLENOL) 500 MG tablet Take 500 mg by mouth every 6 (six) hours as needed for headache (pain).     Marland Kitchen albuterol (PROVENTIL) (2.5 MG/3ML) 0.083% nebulizer solution Take 3 mLs (2.5 mg total) by  nebulization every 4 (four) hours as needed for wheezing or shortness of breath. 75 mL 11  . albuterol (VENTOLIN HFA) 108 (90 BASE) MCG/ACT inhaler Inhale 2 puffs into the lungs every 4 (four) hours as needed for wheezing or shortness of breath. 18 each 11  . ALPRAZolam (XANAX) 0.5 MG tablet Take 1 tablet (0.5 mg total) by mouth 3 (three) times daily. 90 tablet 5  . amLODipine (NORVASC) 10 MG tablet Take 1 tablet (10 mg total) by mouth daily. 90 tablet 0  . atenolol (TENORMIN) 50 MG tablet Take 1 tablet (50 mg total) by mouth daily. 90 tablet 3  . atorvastatin (LIPITOR) 10 MG tablet TAKE 1 TABLET BY MOUTH DAILY. 90 tablet 3  . azelastine (ASTELIN) 0.1 % nasal spray Place 1 spray into both nostrils at bedtime.    . budesonide-formoterol (SYMBICORT) 160-4.5 MCG/ACT inhaler Inhale 2 puffs into the lungs 2 (two) times daily.    . Cholecalciferol (VITAMIN D-3) 5000 UNITS TABS Take 5,000 Units by mouth daily.      . clopidogrel (PLAVIX) 75 MG tablet TAKE 1 TABLET (75 MG TOTAL) BY MOUTH DAILY. 90 tablet 1  . diclofenac sodium (VOLTAREN) 1 % GEL Apply 2 g topically 4 (four) times daily.    . fluticasone (FLONASE) 50 MCG/ACT nasal spray PLACE 2 SPRAYS IN EACH NOSTRIL AT BEDTIME 16 g 6  . furosemide (LASIX) 40 MG tablet TAKE 2 TABLETS (80 MG) BY MOUTH DAILY 270 tablet 3  . gabapentin (NEURONTIN) 100 MG capsule Take 1 capsule (100 mg total) by mouth 3 (three) times daily. [place]  capsule 0  . glimepiride (AMARYL) 1 MG tablet TAKE 1 TABLET BY MOUTH DAILY BEFORE BREAKFAST. 90 tablet 3  . glucose blood (ACCU-CHEK AVIVA) test strip Test once per day and diagnosis code is E 11.9 100 each 1  . HYDROcodone-acetaminophen (NORCO) 10-325 MG tablet Take 1 tablet by mouth every 6 (six) hours as needed for moderate pain. 120 tablet 0  . HYDROcodone-homatropine (HYDROMET) 5-1.5 MG/5ML syrup Take 5 mLs by mouth every 4 (four) hours as needed. 240 mL 0  . hydrocortisone (PROCTOSOL HC) 2.5 % rectal cream Place rectally 3 (three)  times daily. 28.35 g 3  . hydrOXYzine (ATARAX/VISTARIL) 25 MG tablet TAKE 1 TABLET EVERY 4 HOURS AS NEEDED FOR ITCHING 50 tablet 5  . isosorbide mononitrate (IMDUR) 60 MG 24 hr tablet Take 1 tablet (60 mg total) by mouth daily. Please call and schedule November 2018 follow up [phone] 90 tablet 0  . levocetirizine (XYZAL) 5 MG tablet TAKE 1 TABLET (5 MG TOTAL) BY MOUTH EVERY EVENING. 90 tablet 3  . LIDODERM 5 % APPLY 1 PATCH TO SKIN FOR 12 HOURS THEN REMOVE AND DISCARD PATCH WITHIN 72 HOURS OR AS DIRECTED 30 patch 2  . loperamide (IMODIUM) 2 MG capsule Take 4-6 mg by mouth daily.    Marland Kitchen losartan (COZAAR) 50 MG tablet TAKE 1 TABLET BY MOUTH DAILY. 90 tablet 3  . magnesium oxide (MAG-OX) 400 MG tablet TAKE 1 TABLET BY MOUTH DAILY. 90 tablet 0  . mesalamine (CANASA) 1000 MG suppository Place 1 suppository (1,000 mg total) rectally at bedtime. 30 suppository 3  . Mesalamine (DELZICOL) 400 MG CPDR DR capsule Take 4 capsules (1,600 mg total) by mouth 3 (three) times daily. 360 capsule 11  . metFORMIN (GLUCOPHAGE) 1000 MG tablet Take 1 tablet (1,000 mg total) by mouth 2 (two) times daily with a meal. 180 tablet 3  . montelukast (SINGULAIR) 10 MG tablet TAKE 1 TABLET AT BEDTIME 90 tablet 1  . nabumetone (RELAFEN) 750 MG tablet TAKE 1 TABLET (750 MG) BY MOUTH TWICE A DAY WITH A MEAL 180 tablet 3  . neomycin-polymyxin-hydrocortisone (CORTISPORIN) otic solution Use 1 drop in affected ear three times daily as needed 10 mL 0  . nitroGLYCERIN (NITROSTAT) 0.4 MG SL tablet Place 1 tablet (0.4 mg total) under the tongue every 5 (five) minutes as needed for chest pain. 25 tablet 1  . Olopatadine HCl (PAZEO) 0.7 % SOLN Place 1 drop into both eyes daily as needed (itching/allergies).    . ondansetron (ZOFRAN) 4 MG tablet Take 1 tablet (4 mg total) by mouth every 6 (six) hours as needed for nausea or vomiting. 40 tablet 0  . pantoprazole (PROTONIX) 40 MG tablet TAKE 1 TABLET BY MOUTH TWICE DAILY 180 tablet 3  .  Polyethyl Glycol-Propyl Glycol (SYSTANE OP) Place 1 drop into both eyes daily. For dry eyes    . potassium chloride SA (K-DUR,KLOR-CON) 20 MEQ tablet Take 20 mEq by mouth 2 (two) times daily.    Marland Kitchen tiZANidine (ZANAFLEX) 4 MG tablet TAKE 1 TABLET (4 MG TOTAL) BY MOUTH TWICE DAILY 90 tablet 11   Current Facility-Administered Medications on File Prior to Visit  Medication Dose Route Frequency Provider Last Rate Last Dose  . ipratropium-albuterol (DUONEB) 0.5-2.5 (3) MG/3ML nebulizer solution 3 mL  3 mL Nebulization Once Dorothyann Peng, NP       Allergies  Allergen Reactions  . Doxycycline Other (See Comments)    Unsteady gait  . Amoxicillin Other (See Comments)  Unknown reaction   . Aspirin Nausea And Vomiting  . Azithromycin Other (See Comments)    REACTION: pt states "ZPak doesn't work"  . Caffeine Nausea And Vomiting  . Ciprofloxacin Nausea And Vomiting  . Codeine Nausea And Vomiting  . Sulfamethizole Other (See Comments)    Unknown reaction  . Tramadol Hcl Other (See Comments)    REACTION: pt states "spaced-out"    Recent Results (from the past 2160 hour(s))  POC HgB A1c     Status: None   Collection Time: 12/03/16 10:52 AM  Result Value Ref Range   Hemoglobin A1C 6.8   Basic metabolic panel     Status: Abnormal   Collection Time: 12/16/16  2:38 PM  Result Value Ref Range   Sodium 139 135 - 145 mmol/L   Potassium 3.9 3.5 - 5.1 mmol/L   Chloride 102 101 - 111 mmol/L   CO2 26 22 - 32 mmol/L   Glucose, Bld 146 (H) 65 - 99 mg/dL   BUN 10 6 - 20 mg/dL   Creatinine, Ser 0.52 0.44 - 1.00 mg/dL   Calcium 9.2 8.9 - 10.3 mg/dL   GFR calc non Af Amer >60 >60 mL/min   GFR calc Af Amer >60 >60 mL/min    Comment: (NOTE) The eGFR has been calculated using the CKD EPI equation. This calculation has not been validated in all clinical situations. eGFR's persistently <60 mL/min signify possible Chronic Kidney Disease.    Anion gap 11 5 - 15  CBC     Status: Abnormal   Collection  Time: 12/16/16  2:38 PM  Result Value Ref Range   WBC 12.7 (H) 4.0 - 10.5 K/uL   RBC 4.24 3.87 - 5.11 MIL/uL   Hemoglobin 12.5 12.0 - 15.0 g/dL   HCT 38.3 36.0 - 46.0 %   MCV 90.3 78.0 - 100.0 fL   MCH 29.5 26.0 - 34.0 pg   MCHC 32.6 30.0 - 36.0 g/dL   RDW 14.5 11.5 - 15.5 %   Platelets 446 (H) 150 - 400 K/uL  I-stat troponin, ED     Status: None   Collection Time: 12/16/16  2:48 PM  Result Value Ref Range   Troponin i, poc 0.01 0.00 - 0.08 ng/mL   Comment 3            Comment: Due to the release kinetics of cTnI, a negative result within the first hours of the onset of symptoms does not rule out myocardial infarction with certainty. If myocardial infarction is still suspected, repeat the test at appropriate intervals.   Glucose, capillary     Status: Abnormal   Collection Time: 12/16/16  8:45 PM  Result Value Ref Range   Glucose-Capillary 129 (H) 65 - 99 mg/dL  Troponin I (q 6hr x 3)     Status: None   Collection Time: 12/16/16  8:58 PM  Result Value Ref Range   Troponin I <0.03 <0.03 ng/mL  Troponin I (q 6hr x 3)     Status: None   Collection Time: 12/17/16  1:25 AM  Result Value Ref Range   Troponin I <0.03 <0.03 ng/mL  Glucose, capillary     Status: Abnormal   Collection Time: 12/17/16  7:25 AM  Result Value Ref Range   Glucose-Capillary 150 (H) 65 - 99 mg/dL  Troponin I (q 6hr x 3)     Status: None   Collection Time: 12/17/16  8:31 AM  Result Value Ref Range   Troponin I <  0.03 <0.03 ng/mL  Glucose, capillary     Status: Abnormal   Collection Time: 12/17/16 10:57 AM  Result Value Ref Range   Glucose-Capillary 219 (H) 65 - 99 mg/dL   Comment 1 Notify RN   Myocardial Perfusion Imaging     Status: None   Collection Time: 12/27/16  1:04 PM  Result Value Ref Range   Rest HR 60 bpm   Rest BP 185/109 mmHg   Peak HR 85 bpm   Peak BP 161/80 mmHg   SSS 4    SRS 3    SDS 1    LHR 0.30    TID 0.90    LV sys vol 36 mL   LV dias vol 103 46 - 106 mL     Objective: General: Patient is awake, alert, and oriented x 3 and in no acute distress.  Integument: Skin is warm, dry and supple bilateral. Nails are tender, long, thickened and dystrophic with subungual debris, consistent with onychomycosis, 1-5 bilateral. There is improved superimposed scaly skin L>R foot. No open lesions or preulcerative lesions present bilateral. Remaining integument unremarkable.  Vasculature:  Dorsalis Pedis pulse 1/4 bilateral. Posterior Tibial pulse  1/4 bilateral.  Capillary fill time <3 sec 1-5 bilateral. Scant hair growth to the level of the digits.Temperature gradient within normal limits. No varicosities present bilateral. No edema present bilateral.   Neurology: The patient has intact sensation measured with a 5.07/10g Semmes Weinstein Monofilament at all pedal sites bilateral. Vibratory sensation intact bilateral with tuning fork. No Babinski sign present bilateral.   Musculoskeletal:Mild asymptomatic bunion pedal deformities noted bilateral. No pain with palpation bilateral. Muscular strength 5/5 in all lower extremity muscular groups bilateral without pain on range of motion . No tenderness with calf compression bilateral.  Assessment and Plan: Problem List Items Addressed This Visit      Endocrine   Diabetes mellitus without complication ([place])    Other Visit Diagnoses    Dermatophytosis of nail    -  Primary   Relevant Medications   clotrimazole (LOTRIMIN) 1 % external solution   Diabetic peripheral vascular disease (H[facility])       Foot pain, bilateral       Dermatitis       Left foot   Relevant Medications   betamethasone valerate (VALISONE) 0.1 % cream   Tinea       Relevant Medications   clotrimazole (LOTRIMIN) 1 % external solution      -Examined patient. -Discussed and educated patient on diabetic foot care, especially with  regards to the vascular, neurological and musculoskeletal systems.  -Stressed the importance of good glycemic  control and the detriment of not  controlling glucose levels in relation to the foot. -Mechanically debrided all nails 1-5 bilateral using sterile nail nipper and filed with dremel without incident  -Continue with Clotrimazole solution and cream; refill provided  -Answered all patient questions -Patient to return in 3 months for at risk foot care -Patient advised to call the office if any problems or questions arise in the meantime.  Landis Martins, DPM

## 2017-02-07 DIAGNOSIS — E119 Type 2 diabetes mellitus without complications: Secondary | ICD-10-CM | POA: Diagnosis not present

## 2017-02-07 LAB — HM DIABETES EYE EXAM

## 2017-02-12 ENCOUNTER — Other Ambulatory Visit: Payer: Self-pay | Admitting: Family Medicine

## 2017-02-12 MED ORDER — ALBUTEROL SULFATE (2.5 MG/3ML) 0.083% IN NEBU: 2.5000 mg | INHALATION_SOLUTION | RESPIRATORY_TRACT | 3 refills | Status: AC | PRN

## 2017-02-12 NOTE — Telephone Encounter (Signed)
Refill request for nebulizer solution, per Dr. Sarajane Jews okay to refill and I did call in to [facility].

## 2017-02-15 ENCOUNTER — Other Ambulatory Visit: Payer: Self-pay | Admitting: Cardiology

## 2017-02-15 ENCOUNTER — Other Ambulatory Visit: Payer: Self-pay | Admitting: Family Medicine

## 2017-02-18 DIAGNOSIS — M5412 Radiculopathy, cervical region: Secondary | ICD-10-CM | POA: Diagnosis not present

## 2017-02-21 ENCOUNTER — Encounter: Payer: Self-pay | Admitting: Family Medicine

## 2017-02-21 ENCOUNTER — Ambulatory Visit (INDEPENDENT_AMBULATORY_CARE_PROVIDER_SITE_OTHER): Payer: Medicare Other | Admitting: Family Medicine

## 2017-02-21 VITALS — Temp 98.4°F | Ht 60.0 in | Wt 211.0 lb

## 2017-02-21 DIAGNOSIS — L989 Disorder of the skin and subcutaneous tissue, unspecified: Secondary | ICD-10-CM

## 2017-02-21 DIAGNOSIS — I2583 Coronary atherosclerosis due to lipid rich plaque: Secondary | ICD-10-CM | POA: Diagnosis not present

## 2017-02-21 DIAGNOSIS — I2510 Atherosclerotic heart disease of native coronary artery without angina pectoris: Secondary | ICD-10-CM

## 2017-02-21 MED ORDER — HYDROCODONE-ACETAMINOPHEN 10-325 MG PO TABS: 1.0000 | ORAL_TABLET | Freq: Four times a day (QID) | ORAL | 0 refills | Status: DC | PRN

## 2017-02-21 NOTE — Progress Notes (Signed)
Subjective:    Patient ID: [name], female    DOB: 1940-07-22, 76 y.o.   MRN: [id number]  HPI Here asking if she may have shingles. Yesterday she noticed a single red bump appear on the right forearm, and overnight it went back down. There is no pain or itching.    Review of Systems  Constitutional: Negative.   Respiratory: Negative.   Cardiovascular: Negative.   Skin: Positive for color change.       Objective:   Physical Exam  Constitutional: She appears well-developed and well-nourished.  Cardiovascular: Normal rate, regular rhythm, normal heart sounds and intact distal pulses.   Pulmonary/Chest: Effort normal and breath sounds normal. No respiratory distress. She has no wheezes. She has no rales.  Skin:  The right forearm has a tiny red macular lesion           Assessment & Plan:  This was a single pimple and not a herpetic vesicle. I reassured her that this is not shingles. She will recheck prn. I gave her a rx to take to her pharmacist to get the Shingrix vaccine.  Alysia Penna, MD

## 2017-02-21 NOTE — Patient Instructions (Signed)
WE NOW OFFER   [facility] FAST TRACK!!!  SAME DAY Appointments for ACUTE CARE  Such as: Sprains, Injuries, cuts, abrasions, rashes, muscle pain, joint pain, back pain Colds, flu, sore throats, headache, allergies, cough, fever  Ear pain, sinus and eye infections Abdominal pain, nausea, vomiting, diarrhea, upset stomach Animal/insect bites  3 Easy Ways to Schedule: Walk-In Scheduling Call in scheduling Mychart Sign-up: [email or web address]         

## 2017-03-18 ENCOUNTER — Other Ambulatory Visit: Payer: Self-pay | Admitting: Family Medicine

## 2017-03-18 NOTE — Telephone Encounter (Signed)
Sent to PCP for approval.

## 2017-03-20 NOTE — Telephone Encounter (Signed)
Call in #[phone] with 5 rf

## 2017-03-29 ENCOUNTER — Telehealth: Payer: Self-pay | Admitting: Gastroenterology

## 2017-03-29 NOTE — Telephone Encounter (Signed)
Patient with diarrhea.  She will come and see Tye Savoy RNP on 04/02/17 to discuss.  She will push fluids and use imodium per package instructions until OV.

## 2017-04-02 ENCOUNTER — Telehealth: Payer: Self-pay | Admitting: Nurse Practitioner

## 2017-04-02 ENCOUNTER — Encounter: Payer: Self-pay | Admitting: Nurse Practitioner

## 2017-04-02 ENCOUNTER — Ambulatory Visit (INDEPENDENT_AMBULATORY_CARE_PROVIDER_SITE_OTHER): Payer: Medicare Other | Admitting: Nurse Practitioner

## 2017-04-02 ENCOUNTER — Other Ambulatory Visit (INDEPENDENT_AMBULATORY_CARE_PROVIDER_SITE_OTHER): Payer: Medicare Other

## 2017-04-02 VITALS — BP 120/70 | HR 67 | Ht 60.0 in | Wt 208.0 lb

## 2017-04-02 DIAGNOSIS — K51919 Ulcerative colitis, unspecified with unspecified complications: Secondary | ICD-10-CM

## 2017-04-02 LAB — CBC WITH DIFFERENTIAL/PLATELET
Basophils Absolute: 0.1 10*3/uL (ref 0.0–0.1)
Basophils Relative: 0.6 % (ref 0.0–3.0)
EOS PCT: 5.7 % — AB (ref 0.0–5.0)
Eosinophils Absolute: 0.6 10*3/uL (ref 0.0–0.7)
HCT: 38.6 % (ref 36.0–46.0)
Hemoglobin: 12.4 g/dL (ref 12.0–15.0)
LYMPHS ABS: 2 10*3/uL (ref 0.7–4.0)
Lymphocytes Relative: 18.1 % (ref 12.0–46.0)
MCHC: 32.3 g/dL (ref 30.0–36.0)
MCV: 94.4 fl (ref 78.0–100.0)
MONO ABS: 0.8 10*3/uL (ref 0.1–1.0)
Monocytes Relative: 7.3 % (ref 3.0–12.0)
NEUTROS PCT: 68.3 % (ref 43.0–77.0)
Neutro Abs: 7.4 10*3/uL (ref 1.4–7.7)
Platelets: 473 10*3/uL — ABNORMAL HIGH (ref 150.0–400.0)
RBC: 4.09 Mil/uL (ref 3.87–5.11)
RDW: 13.4 % (ref 11.5–15.5)
WBC: 10.9 10*3/uL — ABNORMAL HIGH (ref 4.0–10.5)

## 2017-04-02 LAB — SEDIMENTATION RATE: SED RATE: 17 mm/h (ref 0–30)

## 2017-04-02 LAB — HIGH SENSITIVITY CRP: CRP, High Sensitivity: 2.63 mg/L (ref 0.000–5.000)

## 2017-04-02 MED ORDER — DIPHENOXYLATE-ATROPINE 2.5-0.025 MG PO TABS: 1.0000 | ORAL_TABLET | Freq: Two times a day (BID) | ORAL | 0 refills | Status: DC

## 2017-04-02 NOTE — Progress Notes (Signed)
Chief Complaint:  Recent uc flare  HPI: Patient is a 76 year old female with multiple comorbidities including CAD, CHF, obesity and diabetes .  She is on multiple medications including Plavix . Patient is known to Dr. Fuller Plan. She has history of GERD and left sided ulcerative colitis, maintained on Delzicol 1.6 g tid and imodium.  At the time of her last visit in late January her colitis symptoms are under control.  She continued to do well until late November.  Since then, she has been having multiple non-bloody bowel movements, mainly at night.  During the day she takes several dose of Imodium, especially if needs to be away from home. Last week she had some vomiting, none in the last few days.  She is on multiple medications at home but no recent changes and no antibiotic use this year.  No significant abdominal pain.  No fevers.  She takes so much Imodium and it is getting costly  Last colonoscopy July 2014 revealed mild diverticulosis no findings of colitis . She is due for surveillance July 2019.     Past Medical History:  Diagnosis Date  . Adenomatous colon polyp 02/2006  . Allergy, unspecified not elsewhere classified   . Anemia   . Anxiety   . Arthritis   . Bronchiectasis   . CAD (coronary artery disease)    BMS to the LAD, 2002; cath 12/07/11 patent LAD stent and mild nonobstructive disease, EF 65%; Medical management  . CHF (congestive heart failure) (HCC)    Diastolic  . Diabetes mellitus   . Diverticulosis of colon   . DJD (degenerative joint disease)   . Fibromyalgia   . GERD (gastroesophageal reflux disease)   . Hypercholesterolemia   . Hypertension   . Microscopic hematuria   . Neoplasm of kidney   . Obesity   . Other diseases of lung, not elsewhere classified   . Pinched vertebral nerve   . Pulmonary nodule    Negative PET in 2010  . Stress incontinence, female   . Syncope   . Ulcerative colitis, left sided ([place]) 2008   Hx of  . UTI (lower urinary tract  infection)   . Vitamin D deficiency disease     Patient's surgical history, family medical history, social history, medications and allergies were all reviewed in Epic    Physical Exam: BP 120/70   Pulse 67   Ht 5' (1.524 m)   Wt 208 lb (94.3 kg)   LMP  (LMP Unknown)   BMI 40.62 kg/m   GENERAL: Pleasant black female in NAD PSYCH: :Pleasant, cooperative, normal affect EENT:  conjunctiva pink, mucous membranes moist, neck supple without masses CARDIAC:  RRR, no murmur heard, no peripheral edema PULM: Normal respiratory effort, lungs CTA bilaterally, no wheezing ABDOMEN:  Nondistended, soft, nontender. No obvious masses, no hepatomegaly,  normal bowel sounds SKIN:  turgor, no lesions seen Musculoskeletal:  Normal muscle tone, normal strength NEURO: Alert and oriented x 3, no focal neurologic deficits   ASSESSMENT and PLAN:  Pleasant 76 with left-sided ulcerative colitis diagnosed in 2008, maintained on Delzicol. No active disease on last colonoscopy in 2014. Now with frequent loose stool, recent nausea and vomiting. She is taking several imodium a day. I explained overflow diarrhea which I was concerned about given her excessive use of Imodium. Patient does not feel this is overflow diarrhea -Check stool studies for C. Difficile -CBC, ESR and CRP -Imodium is getting expensive for her . Will try Lomotil, start  with twice daily. I will call her with lab results and to get a condition update. If stool studies are negative and diarrhea persist will consider mesalamine or hydrocortisone enemas  Tye Savoy , NP 04/02/2017, 2:24 PM

## 2017-04-02 NOTE — Telephone Encounter (Signed)
Patient states pharmacy does not have medication lomotil that was suppose to be sent in today and would like it resent

## 2017-04-02 NOTE — Patient Instructions (Addendum)
If you are age 74 or older, your body mass index should be between 23-30. Your Body mass index is 40.62 kg/m. If this is out of the aforementioned range listed, please consider follow up with your Primary Care Provider.  If you are age 20 or younger, your body mass index should be between 19-25. Your Body mass index is 40.62 kg/m. If this is out of the aformentioned range listed, please consider follow up with your Primary Care Provider.   Your physician has requested that you go to the basement for lab work before leaving today.  We have sent the following medications to your pharmacy for you to pick up at your convenience: Take Lomotil twice daily.  Thank you for choosing me and [facility].   Tye Savoy, NP

## 2017-04-03 ENCOUNTER — Other Ambulatory Visit: Payer: Medicare Other

## 2017-04-03 DIAGNOSIS — K51919 Ulcerative colitis, unspecified with unspecified complications: Secondary | ICD-10-CM | POA: Diagnosis not present

## 2017-04-04 ENCOUNTER — Encounter: Payer: Self-pay | Admitting: Nurse Practitioner

## 2017-04-04 LAB — CLOSTRIDIUM DIFFICILE BY PCR: CDIFFPCR: NOT DETECTED

## 2017-04-10 NOTE — Progress Notes (Signed)
Reviewed and agree with initial management plan.  Shanyla Marconi T. Mauro Arps, MD FACG 

## 2017-04-11 ENCOUNTER — Telehealth: Payer: Self-pay

## 2017-04-11 NOTE — Telephone Encounter (Signed)
-----  Message from Willia Craze, NP sent at 04/10/2017 11:32 AM EST ----- Eustaquio Maize, please tell patient that her stool study was negative and labs actually look ok. I'm not sure this is UC flare. Is she better?

## 2017-04-11 NOTE — Telephone Encounter (Signed)
Patient is advised of her lab results. She reports she is taking the medications as ordered. She is not better. Continues to have diarrhea in excess of 6 stools daily. It is interfering with her sleep and her diet. She is will fast to keep from having stools.

## 2017-04-13 NOTE — Telephone Encounter (Signed)
Ok then lets try a short course of prednisone. Please start 40 mg daily x 5 days then 30 mg x 5 days. Will decide about further reduction depending on how she feels. Please ask her to call in with an update in a week. Thanks

## 2017-04-15 ENCOUNTER — Other Ambulatory Visit: Payer: Self-pay

## 2017-04-15 MED ORDER — PREDNISONE 10 MG PO TABS: ORAL_TABLET | ORAL | 0 refills | Status: DC

## 2017-04-15 NOTE — Telephone Encounter (Signed)
Left a message to call

## 2017-04-15 NOTE — Telephone Encounter (Signed)
Spoke with patient and explained the plan of care. She agrees to this. She will call with her progress update before the end of 10 days. Prescribe #50 tablets of 10 mg Prednisone.

## 2017-04-29 ENCOUNTER — Telehealth: Payer: Self-pay | Admitting: Nurse Practitioner

## 2017-04-29 NOTE — Telephone Encounter (Signed)
Patient states the nurse called her and told her to call back today to discuss her medication.

## 2017-05-01 NOTE — Telephone Encounter (Signed)
Ok, looks like I am seeing her on 1/10. Please make sure she isn't exceeding 3 lomotil a day. She should have 5 days of prednisone 30 mg and then drop to 25 mg daily to stay there until I see her in clinic. Thanks. Call us back if bloody stools and / or any fevers.

## 2017-05-01 NOTE — Telephone Encounter (Signed)
The patient is one Prednisone 30 mg daily. She is not vomiting anymore. She continues to have a strong pain and the need to defecate if she eats. She has restricted her diet. She continues to use Lomotil daily. She is unable to tell me exactly if she is having multiple BM's because she takes enough Lomotil to "stop up" her bowels in order to be able to come out of the house or eat. I have made her a follow up appointment.

## 2017-05-02 NOTE — Telephone Encounter (Signed)
Left information on her voicemail.

## 2017-05-03 ENCOUNTER — Other Ambulatory Visit: Payer: Self-pay

## 2017-05-03 MED ORDER — PREDNISONE 5 MG PO TABS: ORAL_TABLET | ORAL | 0 refills | Status: AC

## 2017-05-03 MED ORDER — DIPHENOXYLATE-ATROPINE 2.5-0.025 MG PO TABS: 1.0000 | ORAL_TABLET | Freq: Two times a day (BID) | ORAL | 0 refills | Status: DC

## 2017-05-03 NOTE — Telephone Encounter (Signed)
Left a message returning her call.

## 2017-05-03 NOTE — Telephone Encounter (Signed)
Patient returned phone call. °

## 2017-05-03 NOTE — Telephone Encounter (Signed)
Patient is instructed.

## 2017-05-07 ENCOUNTER — Ambulatory Visit: Payer: Medicare Other | Admitting: Nurse Practitioner

## 2017-05-09 ENCOUNTER — Encounter: Payer: Self-pay | Admitting: Nurse Practitioner

## 2017-05-09 ENCOUNTER — Ambulatory Visit (INDEPENDENT_AMBULATORY_CARE_PROVIDER_SITE_OTHER): Payer: Medicare Other | Admitting: Nurse Practitioner

## 2017-05-09 VITALS — BP 138/72 | HR 62 | Ht 60.0 in | Wt 211.2 lb

## 2017-05-09 DIAGNOSIS — K51919 Ulcerative colitis, unspecified with unspecified complications: Secondary | ICD-10-CM

## 2017-05-09 NOTE — Progress Notes (Signed)
Reviewed and agree with initial management plan.  Dionicio Shelnutt T. Saina Waage, MD FACG 

## 2017-05-09 NOTE — Patient Instructions (Signed)
If you are age 75 or older, your body mass index should be between 23-30. Your Body mass index is 41.25 kg/m. If this is out of the aforementioned range listed, please consider follow up with your Primary Care Provider.  If you are age 57 or younger, your body mass index should be between 19-25. Your Body mass index is 41.25 kg/m. If this is out of the aformentioned range listed, please consider follow up with your Primary Care Provider.   Decrease Prednisone to 15 mg and stay at that dose until seen by Dr. Fuller Plan on May 15, 2017 at 9:45 am.  Thank you for choosing me and [facility].   Tye Savoy, NP

## 2017-05-09 NOTE — Progress Notes (Signed)
Chief Complaint:   Follow up   HPI: Patient is a 77 year old female with multiple comorbidities, on multiple medications including Plavix .  She is known to Dr. Fuller Plan.  She has a history of GERD and left-sided UC.  She was seen by Dr. Fuller Plan a year ago, UC symptoms at that time included intermittent diarrhea,  well-controlled with Imodium as needed.  She is maintained on Delzicol 1.6 g 3 times daily.  He had a normal colonoscopy with biopsies 2014.  I saw her the first week in December for several weeks  of diarrhea as well as occasional nausea and vomiting.  Diarrhea was mainly nocturnal as patient was taking several doses of Imodium during the day so that she could be out in public.  Labs and stool studies were obtained, all unremarkable.  Etiology of her symptoms was not clear but empirically treated her with prednisone taper .  Started at 40 mg daily , only on day 5 of 25 mg.  She is here for follow-up  Her bowel movements have definitely improved.  They are not formed and still urgent but frequency has decreased with the prednisone.  No bleeding except for scant amount yesterday.  I had prescribed her Lomotil at our last visit in an effort to cut back on the amount of Imodium she was taking.  I do not think she is taking the Lomotil.  She is still taking the Imodium but usually no more than once a day and that is when going out in public.  She still has occasional nausea vomiting especially when having a bowel movement.  She complains of mid upper abdominal discomfort which tends to occur when she takes prednisone tablets .  She is on twice daily PPI.  Overall, Ms. Pangelinan just does not feel well  Past Medical History:  Diagnosis Date  . Adenomatous colon polyp 02/2006  . Allergy, unspecified not elsewhere classified   . Anemia   . Anxiety   . Arthritis   . Bronchiectasis   . CAD (coronary artery disease)    BMS to the LAD, 2002; cath 12/07/11 patent LAD stent and mild nonobstructive  disease, EF 65%; Medical management  . CHF (congestive heart failure) (HCC)    Diastolic  . Diabetes mellitus   . Diverticulosis of colon   . DJD (degenerative joint disease)   . Fibromyalgia   . GERD (gastroesophageal reflux disease)   . Hypercholesterolemia   . Hypertension   . Microscopic hematuria   . Neoplasm of kidney   . Obesity   . Other diseases of lung, not elsewhere classified   . Pinched vertebral nerve   . Pulmonary nodule    Negative PET in 2010  . Stress incontinence, female   . Syncope   . Ulcerative colitis, left sided ([place]) 2008   Hx of  . UTI (lower urinary tract infection)   . Vitamin D deficiency disease     Patient's surgical history, family medical history, social history, medications and allergies were all reviewed in Epic    Physical Exam: BP 138/72   Pulse 62   Ht 5' (1.524 m)   Wt 211 lb 3.2 oz (95.8 kg)   LMP  (LMP Unknown)   SpO2 94%   BMI 41.25 kg/m   GENERAL: pleasant well developed black female in NAD PSYCH: :Pleasant, cooperative, normal affect EENT:  conjunctiva pink, mucous membranes moist, neck supple without masses CARDIAC:  RRR, no peripheral edema PULM: Normal respiratory effort,  lungs CTA bilaterally, no wheezing ABDOMEN:  Nondistended, soft, nontender. No obvious masses, no hepatomegaly,  normal bowel sounds SKIN:  turgor, no lesions seen Musculoskeletal:  Normal muscle tone, normal strength NEURO: Alert and oriented x 3, no focal neurologic deficits   ASSESSMENT and PLAN:  Pleasant 77 year old female with left-sided UC, normal colonoscopy with biopsies in 2014.  She is maintained on Delzicol.  I saw her early December for diarrhea, nausea / vomiting.  Labs and stool studies were negative.  Treated empirically with a tapering dose of prednisone and currently at 25 mg daily.    Bowel movements frequency improved but she still has occasional nausea and vomiting and now having mid to upper abdominal discomfort. She gets the pain  after taking the prednisone. No nsaid use and on bid PPI.  -Not clear if she had an actual UC flare. Bowel movement frequency has improved with prednisone not still feels poor and having abdominal discomfort, N + V of unclear etiology. Polypharmacy may be contributing. She may need repeat endoscopic evaluation and / or imaging.  -Will expedite prednisone taper. Drop from 60m to 181mtoday. Dr. StFuller Planas an opening next week and I plan to get patient in to see him. Stay at 15 mg prednisone daily until then. She     PaTye Savoy NP 05/09/2017, 4:06 PM

## 2017-05-14 ENCOUNTER — Ambulatory Visit (INDEPENDENT_AMBULATORY_CARE_PROVIDER_SITE_OTHER): Payer: Medicare Other | Admitting: Sports Medicine

## 2017-05-14 ENCOUNTER — Encounter: Payer: Self-pay | Admitting: Sports Medicine

## 2017-05-14 DIAGNOSIS — M79671 Pain in right foot: Secondary | ICD-10-CM | POA: Diagnosis not present

## 2017-05-14 DIAGNOSIS — B351 Tinea unguium: Secondary | ICD-10-CM | POA: Diagnosis not present

## 2017-05-14 DIAGNOSIS — M79672 Pain in left foot: Secondary | ICD-10-CM | POA: Diagnosis not present

## 2017-05-14 DIAGNOSIS — E1151 Type 2 diabetes mellitus with diabetic peripheral angiopathy without gangrene: Secondary | ICD-10-CM | POA: Diagnosis not present

## 2017-05-14 NOTE — Progress Notes (Signed)
Patient ID: [name], female   DOB: 06/05/1940, 76 y.o.   MRN: [id number]  Subjective: [name] is a 76 y.o. female patient with history of diabetes who returns to office today complaining of long, painful nails  while ambulating in shoes; unable to trim.  Patient states that the glucose reading this morning was not recorded today but has been "good", last a1c was 6 Patient denies any new changes in medication or any other new problems besides a flare back in Oct of ulcerative colitis.   Patient Active Problem List   Diagnosis Date Noted  . Fibromyalgia 01/03/2017  . Left sided colitis (HCC) 05/13/2015  . Diarrhea 08/04/2013  . Back pain, chronic 02/06/2012  . Hyperlipidemia 12/07/2011  . Hematuria 11/27/2011  . Ulcerative colitis (HCC) 11/25/2011  . Chronic diastolic CHF (congestive heart failure) (HCC) 11/25/2011  . Lower extremity edema 11/25/2011  . Nonischemic cardiomyopathy (HCC) 04/17/2011  . CAD (coronary artery disease)   . Bronchiectasis (HCC)   . Essential hypertension   . VITAMIN D DEFICIENCY 12/03/2009  . Depression 08/10/2008  . PULMONARY NODULE 07/15/2008  . NEOPLASM, KIDNEY 03/16/2008  . STRESS INCONTINENCE 03/16/2008  . COLONIC POLYPS 07/19/2007  . Diabetes mellitus without complication (HCC) 07/19/2007  . OBESITY 07/19/2007  . ANEMIA 07/19/2007  . Asthma 07/19/2007  . DIVERTICULOSIS OF COLON 07/19/2007  . ANXIETY 03/19/2007  . GERD 03/19/2007  . Osteoarthritis 03/19/2007  . FIBROMYALGIA 03/19/2007  . Allergic state 03/19/2007   Current Outpatient Medications on File Prior to Visit  Medication Sig Dispense Refill  . ACCU-CHEK FASTCLIX LANCETS MISC Test once per day and diagnosis code is E 11.9 102 each 1  . acetaminophen (TYLENOL) 500 MG tablet Take 500 mg by mouth every 6 (six) hours as needed for headache (pain).     . albuterol (PROVENTIL) (2.5 MG/3ML) 0.083% nebulizer solution Take 3 mLs (2.5 mg total) by nebulization every 4 (four) hours as  needed for wheezing or shortness of breath. 75 mL 3  . albuterol (VENTOLIN HFA) 108 (90 BASE) MCG/ACT inhaler Inhale 2 puffs into the lungs every 4 (four) hours as needed for wheezing or shortness of breath. 18 each 11  . ALPRAZolam (XANAX) 0.5 MG tablet TAKE 1 TABLET BY MOUTH 3 TIMES A DAY 90 tablet 5  . amLODipine (NORVASC) 10 MG tablet TAKE 1 TABLET BY MOUTH EVERY DAY 90 tablet 3  . atenolol (TENORMIN) 50 MG tablet Take 1 tablet (50 mg total) by mouth daily. 90 tablet 3  . atorvastatin (LIPITOR) 10 MG tablet TAKE 1 TABLET BY MOUTH DAILY. 90 tablet 3  . azelastine (ASTELIN) 0.1 % nasal spray Place 1 spray into both nostrils at bedtime.    . betamethasone valerate (VALISONE) 0.1 % cream Apply topically 2 (two) times daily. 30 g 2  . budesonide-formoterol (SYMBICORT) 160-4.5 MCG/ACT inhaler Inhale 2 puffs into the lungs 2 (two) times daily.    . Cholecalciferol (VITAMIN D-3) 5000 UNITS TABS Take 5,000 Units by mouth daily.      . clopidogrel (PLAVIX) 75 MG tablet TAKE 1 TABLET BY MOUTH EVERY DAY 90 tablet 3  . clotrimazole (LOTRIMIN) 1 % external solution Apply 1 application topically 2 (two) times daily. In between toes 60 mL 5  . diclofenac sodium (VOLTAREN) 1 % GEL Apply 2 g topically 4 (four) times daily.    . diphenoxylate-atropine (LOMOTIL) 2.5-0.025 MG tablet Take 1 tablet by mouth 2 (two) times daily. As needed 60 tablet 0  . fluticasone (FLONASE) 50 MCG/ACT   nasal spray PLACE 2 SPRAYS IN EACH NOSTRIL AT BEDTIME 16 g 6  . furosemide (LASIX) 40 MG tablet TAKE 2 TABLETS (80 MG) BY MOUTH DAILY 270 tablet 3  . glimepiride (AMARYL) 1 MG tablet TAKE 1 TABLET BY MOUTH DAILY BEFORE BREAKFAST. 90 tablet 3  . glucose blood (ACCU-CHEK AVIVA) test strip Test once per day and diagnosis code is E 11.9 100 each 1  . HYDROcodone-acetaminophen (NORCO) 10-325 MG tablet Take 1 tablet by mouth every 6 (six) hours as needed for moderate pain. 120 tablet 0  . HYDROcodone-homatropine (HYDROMET) 5-1.5 MG/5ML syrup  Take 5 mLs by mouth every 4 (four) hours as needed. 240 mL 0  . hydrocortisone (PROCTOSOL HC) 2.5 % rectal cream Place rectally 3 (three) times daily. 28.35 g 3  . hydrOXYzine (ATARAX/VISTARIL) 25 MG tablet TAKE 1 TABLET EVERY 4 HOURS AS NEEDED FOR ITCHING 50 tablet 0  . isosorbide mononitrate (IMDUR) 60 MG 24 hr tablet TAKE 1 TABLET BY MOUTH DAILY, PLEASE SCHEDULE NOVEMBER 2018 FOLLOW-UP [phone] 90 tablet 3  . levocetirizine (XYZAL) 5 MG tablet TAKE 1 TABLET (5 MG TOTAL) BY MOUTH EVERY EVENING. 90 tablet 3  . LIDODERM 5 % APPLY 1 PATCH TO SKIN FOR 12 HOURS THEN REMOVE AND DISCARD PATCH WITHIN 72 HOURS OR AS DIRECTED 30 patch 2  . loperamide (IMODIUM) 2 MG capsule Take 4-6 mg by mouth daily.    . losartan (COZAAR) 50 MG tablet TAKE 1 TABLET BY MOUTH DAILY. 90 tablet 3  . magnesium oxide (MAG-OX) 400 MG tablet TAKE 1 TABLET BY MOUTH EVERY DAY 90 tablet 0  . Mesalamine (DELZICOL) 400 MG CPDR DR capsule Take 4 capsules (1,600 mg total) by mouth 3 (three) times daily. 360 capsule 11  . metFORMIN (GLUCOPHAGE) 1000 MG tablet Take 1 tablet (1,000 mg total) by mouth 2 (two) times daily with a meal. 180 tablet 3  . montelukast (SINGULAIR) 10 MG tablet TAKE 1 TABLET BY MOUTH EVERYDAY AT BEDTIME 90 tablet 0  . nabumetone (RELAFEN) 750 MG tablet TAKE 1 TABLET (750 MG) BY MOUTH TWICE A DAY WITH A MEAL 180 tablet 3  . neomycin-polymyxin-hydrocortisone (CORTISPORIN) otic solution Use 1 drop in affected ear three times daily as needed 10 mL 0  . nitroGLYCERIN (NITROSTAT) 0.4 MG SL tablet Place 1 tablet (0.4 mg total) under the tongue every 5 (five) minutes as needed for chest pain. 25 tablet 1  . Olopatadine HCl (PAZEO) 0.7 % SOLN Place 1 drop into both eyes daily as needed (itching/allergies).    . ondansetron (ZOFRAN) 4 MG tablet Take 1 tablet (4 mg total) by mouth every 6 (six) hours as needed for nausea or vomiting. 40 tablet 0  . pantoprazole (PROTONIX) 40 MG tablet TAKE 1 TABLET BY MOUTH TWICE DAILY 180  tablet 3  . Polyethyl Glycol-Propyl Glycol (SYSTANE OP) Place 1 drop into both eyes daily. For dry eyes    . potassium chloride SA (K-DUR,KLOR-CON) 20 MEQ tablet Take 20 mEq by mouth 2 (two) times daily.    . predniSONE (DELTASONE) 5 MG tablet 25 mg daily and taper by 5 mg every 7 days and as directed. 70 tablet 0  . tiZANidine (ZANAFLEX) 4 MG tablet TAKE 1 TABLET (4 MG TOTAL) BY MOUTH TWICE DAILY 90 tablet 11   Current Facility-Administered Medications on File Prior to Visit  Medication Dose Route Frequency Provider Last Rate Last Dose  . ipratropium-albuterol (DUONEB) 0.5-2.5 (3) MG/3ML nebulizer solution 3 mL  3 mL Nebulization Once Nafziger,   Tommi Rumps, NP       Allergies  Allergen Reactions  . Doxycycline Other (See Comments)    Unsteady gait  . Amoxicillin Other (See Comments)    Unknown reaction   . Aspirin Nausea And Vomiting  . Azithromycin Other (See Comments)    REACTION: pt states "ZPak doesn't work"  . Caffeine Nausea And Vomiting  . Ciprofloxacin Nausea And Vomiting  . Codeine Nausea And Vomiting  . Sulfamethizole Other (See Comments)    Unknown reaction  . Tramadol Hcl Other (See Comments)    REACTION: pt states "spaced-out"    Recent Results (from the past 2160 hour(s))  CRP High sensitivity     Status: None   Collection Time: 04/02/17  3:19 PM  Result Value Ref Range   CRP, High Sensitivity 2.630 0.000 - 5.000 mg/L    Comment: Note:  An elevated hs-CRP (>5 mg/L) should be repeated after 2 weeks to rule out recent infection or trauma.  Sed Rate (ESR)     Status: None   Collection Time: 04/02/17  3:19 PM  Result Value Ref Range   Sed Rate 17 0 - 30 mm/hr  CBC w/Diff     Status: Abnormal   Collection Time: 04/02/17  3:19 PM  Result Value Ref Range   WBC 10.9 (H) 4.0 - 10.5 K/uL   RBC 4.09 3.87 - 5.11 Mil/uL   Hemoglobin 12.4 12.0 - 15.0 g/dL   HCT 38.6 36.0 - 46.0 %   MCV 94.4 78.0 - 100.0 fl   MCHC 32.3 30.0 - 36.0 g/dL   RDW 13.4 11.5 - 15.5 %   Platelets  473.0 (H) 150.0 - 400.0 K/uL   Neutrophils Relative % 68.3 43.0 - 77.0 %   Lymphocytes Relative 18.1 12.0 - 46.0 %   Monocytes Relative 7.3 3.0 - 12.0 %   Eosinophils Relative 5.7 (H) 0.0 - 5.0 %   Basophils Relative 0.6 0.0 - 3.0 %   Neutro Abs 7.4 1.4 - 7.7 K/uL   Lymphs Abs 2.0 0.7 - 4.0 K/uL   Monocytes Absolute 0.8 0.1 - 1.0 K/uL   Eosinophils Absolute 0.6 0.0 - 0.7 K/uL   Basophils Absolute 0.1 0.0 - 0.1 K/uL  Clostridium Difficile by PCR     Status: None   Collection Time: 04/03/17  4:03 PM  Result Value Ref Range   Toxigenic C. Difficile by PCR NOT DETECTED NOT DETECT    Comment: . This test is for use only with liquid or soft stools;  performance characteristics of other clinical specimen  types have not been established. . This assay was performed by Cepheid GeneXpert(R) PCR. The performance characteristics of this assay have been determined by [employer]. Performance characteristics refer to the analytical performance of the test. . For additional information, please refer to http://education.[facility].com/faq/FAQ136 (This link is being provided for informational/educational purposes only.)     Objective: General: Patient is awake, alert, and oriented x 3 and in no acute distress.  Integument: Skin is warm, dry and supple bilateral. Nails are tender, long, thickened and dystrophic with subungual debris, consistent with onychomycosis, 1-5 bilateral. There is improved superimposed scaly skin L>R foot. No open lesions or preulcerative lesions present bilateral. Remaining integument unremarkable.  Vasculature:  Dorsalis Pedis pulse 1/4 bilateral. Posterior Tibial pulse  1/4 bilateral.  Capillary fill time <3 sec 1-5 bilateral. Scant hair growth to the level of the digits.Temperature gradient within normal limits. No varicosities present bilateral. No edema present bilateral.   Neurology: The  patient has intact sensation measured with a 5.07/10g Semmes  Weinstein Monofilament at all pedal sites bilateral. Vibratory sensation intact bilateral with tuning fork. No Babinski sign present bilateral.   Musculoskeletal:Mild asymptomatic bunion pedal deformities noted bilateral. No pain with palpation bilateral. Muscular strength 5/5 in all lower extremity muscular groups bilateral without pain on range of motion . No tenderness with calf compression bilateral.  Assessment and Plan: Problem List Items Addressed This Visit    None    Visit Diagnoses    Dermatophytosis of nail    -  Primary   Diabetic peripheral vascular disease ([place])       Foot pain, bilateral          -Examined patient. -Discussed and educated patient on diabetic foot care, especially with  regards to the vascular, neurological and musculoskeletal systems.  -Stressed the importance of good glycemic control and the detriment of not  controlling glucose levels in relation to the foot. -Mechanically debrided all nails 1-5 bilateral using sterile nail nipper and filed with dremel without incident  -Continue with Clotrimazole solution and cream -Answered all patient questions -Patient to return in 3 months for at risk foot care -Patient advised to call the office if any problems or questions arise in the meantime.  Landis Martins, DPM

## 2017-05-15 ENCOUNTER — Telehealth: Payer: Self-pay | Admitting: Gastroenterology

## 2017-05-15 ENCOUNTER — Ambulatory Visit (INDEPENDENT_AMBULATORY_CARE_PROVIDER_SITE_OTHER): Payer: Medicare Other | Admitting: Gastroenterology

## 2017-05-15 ENCOUNTER — Encounter: Payer: Self-pay | Admitting: Gastroenterology

## 2017-05-15 VITALS — BP 110/78 | HR 68 | Ht 60.0 in | Wt 208.8 lb

## 2017-05-15 DIAGNOSIS — K51519 Left sided colitis with unspecified complications: Secondary | ICD-10-CM | POA: Diagnosis not present

## 2017-05-15 DIAGNOSIS — K580 Irritable bowel syndrome with diarrhea: Secondary | ICD-10-CM

## 2017-05-15 MED ORDER — DICYCLOMINE HCL 10 MG PO CAPS: 10.0000 mg | ORAL_CAPSULE | Freq: Three times a day (TID) | ORAL | 11 refills | Status: DC

## 2017-05-15 MED ORDER — AMBULATORY NON FORMULARY MEDICATION: 2 refills | Status: DC

## 2017-05-15 NOTE — Telephone Encounter (Signed)
Dr. Fuller Plan, a reaction did not come up in the system. Please advise Dr. Fuller Plan if patient should continue to pick up from pharmacy.

## 2017-05-15 NOTE — Progress Notes (Signed)
History of Present Illness: This is a 77 year old female with a history of left-sided ulcerative colitis returning for follow-up.  She states she has had a flare gastrointestinal symptoms since October.  She relates frequent postprandial diarrhea associated with upper abdominal cramping and bloating.  She has had traces of rectal bleeding on occasion.  She was recently started on a prednisone taper with improvement in symptoms however her bloating and diarrhea persist.  Last colonoscopy in 2014 with no endoscopic or histologic evidence of colitis.  Current Medications, Allergies, Past Medical History, Past Surgical History, Family History and Social History were reviewed in [employer] record.  Physical Exam: General: Well developed, well nourished, no acute distress Head: Normocephalic and atraumatic Eyes:  sclerae anicteric, EOMI Ears: Normal auditory acuity Mouth: No deformity or lesions Lungs: Clear throughout to auscultation Heart: Regular rate and rhythm; no murmurs, rubs or bruits Abdomen: Soft, mild upper tenderness and non distended. No masses, hepatosplenomegaly or hernias noted. Normal Bowel sounds Rectal: deferred to colonoscopy Musculoskeletal: Symmetrical with no gross deformities  Pulses:  Normal pulses noted Extremities: No clubbing, cyanosis, edema or deformities noted Neurological: Alert oriented x 4, grossly nonfocal Psychological:  Alert and cooperative. Normal mood and affect  Assessment and Recommendations:  1. Left sided UC. Suspected IBS. Taper Prednisone to 10 mg qd x 5d, then 5 mg qd x 5d, then DC. Budesonide 10 mg daily for 2 months - begin while on Prednisone 10 mg qd.  Dicyclomine 10 mg 3 times daily before meals avoid foods that exacerbate symptoms.  Symptoms did not improve consider colonoscopy. REV in 6 weeks.

## 2017-05-15 NOTE — Telephone Encounter (Signed)
Informed pharmacy to fill medication per Dr. Fuller Plan and have patient pick it up.

## 2017-05-15 NOTE — Patient Instructions (Addendum)
We have sent the budesonide 10 mg daily to [facility]. There address is [address], [place], [place]. This medication takes 48 hours to compound and their phone number in case you do not hear from them is [phone]. Please start this medictaion while you are on prednisone 10 mg dosing.   Please take prednisone 10 mg daily x 5 days, then reduce to 5 mg daily x 5 days then stop.   We have sent the following medications to your pharmacy for you to pick up at your convenience:Bentyl 10 mg.  Thank you for choosing me and [facility].  Pricilla Riffle. Dagoberto Ligas., MD., [facility]

## 2017-05-15 NOTE — Telephone Encounter (Signed)
Not clinically significant. Please take the dicyclomine.

## 2017-05-20 ENCOUNTER — Encounter: Payer: Self-pay | Admitting: *Deleted

## 2017-05-30 ENCOUNTER — Other Ambulatory Visit: Payer: Self-pay | Admitting: Family Medicine

## 2017-06-04 ENCOUNTER — Ambulatory Visit (INDEPENDENT_AMBULATORY_CARE_PROVIDER_SITE_OTHER): Payer: Medicare Other | Admitting: Cardiology

## 2017-06-04 ENCOUNTER — Encounter: Payer: Self-pay | Admitting: Cardiology

## 2017-06-04 VITALS — BP 118/74 | HR 69 | Ht 60.0 in | Wt 213.8 lb

## 2017-06-04 DIAGNOSIS — I5032 Chronic diastolic (congestive) heart failure: Secondary | ICD-10-CM

## 2017-06-04 DIAGNOSIS — I2510 Atherosclerotic heart disease of native coronary artery without angina pectoris: Secondary | ICD-10-CM

## 2017-06-04 DIAGNOSIS — K5100 Ulcerative (chronic) pancolitis without complications: Secondary | ICD-10-CM

## 2017-06-04 DIAGNOSIS — I2583 Coronary atherosclerosis due to lipid rich plaque: Secondary | ICD-10-CM | POA: Diagnosis not present

## 2017-06-04 MED ORDER — NITROGLYCERIN 0.4 MG SL SUBL: 0.4000 mg | SUBLINGUAL_TABLET | SUBLINGUAL | 11 refills | Status: DC | PRN

## 2017-06-04 MED ORDER — LOSARTAN POTASSIUM 50 MG PO TABS: 50.0000 mg | ORAL_TABLET | Freq: Every day | ORAL | 3 refills | Status: DC

## 2017-06-04 MED ORDER — ATENOLOL 50 MG PO TABS: 50.0000 mg | ORAL_TABLET | Freq: Every day | ORAL | 3 refills | Status: DC

## 2017-06-04 NOTE — Patient Instructions (Signed)
Medication Instructions:  The current medical regimen is effective;  continue present plan and medications.  Follow-Up: Follow up in 6 months with Cecilie Kicks, NP.  You will receive a letter in the mail 2 months before you are due.  Please call us when you receive this letter to schedule your follow up appointment.  Follow up in 1 year with Dr. Marlou Porch.  You will receive a letter in the mail 2 months before you are due.  Please call us when you receive this letter to schedule your follow up appointment.  If you need a refill on your cardiac medications before your next appointment, please call your pharmacy.  Thank you for choosing [facility]!!

## 2017-06-04 NOTE — Progress Notes (Signed)
Cardiology Office Note   Date:  06/04/2017   ID:  [name], DOB 10-04-1940, MRN [id number]  PCP:  [name], MD  Cardiologist:   Candee Furbish, MD       History of Present Illness: [name] is a 77 y.o. female former patient of Dr. Ron Parker who in 2002 had an BMS Express 3.5 x 20 mm stent placed in the proximal LAD with 3 subsequent catheterization showing patent stent here for follow-up.  Physical history of not being able to tolerate aspirin. Stomach pain.  She remains on Plavix long-term.  Left arm focal pain sometimes, right as well. Likely noncardiac. She also discussed occasional right-sided jaw pain at rest. Nonexertional. This seems to be relieved when she stretches surgical back. Lives with son and his wife. Son encourages NTG use.   Has ulcerative colitis. Had trouble at the beach. Back, disk trouble. Discouraged by body aches.  06/04/17 - doing well. UC flair. No CP, no SOB. Likes to drink water. Keeps at beside. No SOB. Weight up.   Past Medical History:  Diagnosis Date  . Adenomatous colon polyp 02/2006  . Allergy, unspecified not elsewhere classified   . Anemia   . Anxiety   . Arthritis   . Bronchiectasis   . CAD (coronary artery disease)    BMS to the LAD, 2002; cath 12/07/11 patent LAD stent and mild nonobstructive disease, EF 65%; Medical management  . CHF (congestive heart failure) (HCC)    Diastolic  . Diabetes mellitus   . Diverticulosis of colon   . DJD (degenerative joint disease)   . Fibromyalgia   . GERD (gastroesophageal reflux disease)   . Hypercholesterolemia   . Hypertension   . Microscopic hematuria   . Neoplasm of kidney   . Obesity   . Other diseases of lung, not elsewhere classified   . Pinched vertebral nerve   . Pulmonary nodule    Negative PET in 2010  . Stress incontinence, female   . Syncope   . Ulcerative colitis, left sided ([place]) 2008   Hx of  . UTI (lower urinary tract infection)   . Vitamin D deficiency disease      Past Surgical History:  Procedure Laterality Date  . ABDOMINAL HYSTERECTOMY    . CARDIAC CATHETERIZATION  11/2011  . CHOLECYSTECTOMY    . COLONOSCOPY W/ BIOPSIES  11-12-12   per Dr. Fuller Plan, diverticulosis only, repeat in 5 yrs  . CORONARY STENT PLACEMENT  2002   BMS to the LAD  . ESOPHAGOGASTRODUODENOSCOPY  12/2008  . HAND SURGERY  01/2006   Right Hand Surgery by Dr. Amedeo Plenty  . LEFT HEART CATHETERIZATION WITH CORONARY ANGIOGRAM N/A 12/07/2011   Procedure: LEFT HEART CATHETERIZATION WITH CORONARY ANGIOGRAM;  Surgeon: Peter M Martinique, MD;  Location: [facility] CATH LAB;  Service: Cardiovascular;  Laterality: N/A;     Current Outpatient Medications  Medication Sig Dispense Refill  . ACCU-CHEK FASTCLIX LANCETS MISC Test once per day and diagnosis code is E 11.9 102 each 1  . acetaminophen (TYLENOL) 500 MG tablet Take 500 mg by mouth every 6 (six) hours as needed for headache (pain).     Marland Kitchen albuterol (PROVENTIL) (2.5 MG/3ML) 0.083% nebulizer solution Take 3 mLs (2.5 mg total) by nebulization every 4 (four) hours as needed for wheezing or shortness of breath. 75 mL 3  . albuterol (VENTOLIN HFA) 108 (90 BASE) MCG/ACT inhaler Inhale 2 puffs into the lungs every 4 (four) hours as needed for wheezing or shortness  of breath. 18 each 11  . ALPRAZolam (XANAX) 0.5 MG tablet TAKE 1 TABLET BY MOUTH 3 TIMES A DAY 90 tablet 5  . AMBULATORY NON FORMULARY MEDICATION Medication Name: Budesonide 5 mg Take 2 tablets by mouth daily 60 tablet 2  . amLODipine (NORVASC) 10 MG tablet TAKE 1 TABLET BY MOUTH EVERY DAY 90 tablet 3  . atenolol (TENORMIN) 50 MG tablet Take 1 tablet (50 mg total) by mouth daily. 90 tablet 3  . atorvastatin (LIPITOR) 10 MG tablet TAKE 1 TABLET BY MOUTH DAILY. 90 tablet 3  . azelastine (ASTELIN) 0.1 % nasal spray Place 1 spray into both nostrils at bedtime.    . betamethasone valerate (VALISONE) 0.1 % cream Apply topically 2 (two) times daily. 30 g 2  . budesonide-formoterol (SYMBICORT) 160-4.5  MCG/ACT inhaler Inhale 2 puffs into the lungs 2 (two) times daily.    . Cholecalciferol (VITAMIN D-3) 5000 UNITS TABS Take 5,000 Units by mouth daily.      . clopidogrel (PLAVIX) 75 MG tablet TAKE 1 TABLET BY MOUTH EVERY DAY 90 tablet 3  . clotrimazole (LOTRIMIN) 1 % external solution Apply 1 application topically 2 (two) times daily. In between toes 60 mL 5  . diclofenac sodium (VOLTAREN) 1 % GEL Apply 2 g topically 4 (four) times daily.    Marland Kitchen dicyclomine (BENTYL) 10 MG capsule Take 1 capsule (10 mg total) by mouth 3 (three) times daily before meals. 90 capsule 11  . diphenoxylate-atropine (LOMOTIL) 2.5-0.025 MG tablet Take 1 tablet by mouth 2 (two) times daily. As needed 60 tablet 0  . fluticasone (FLONASE) 50 MCG/ACT nasal spray PLACE 2 SPRAYS IN EACH NOSTRIL AT BEDTIME 16 g 6  . furosemide (LASIX) 40 MG tablet TAKE 2 TABLETS (80 MG) BY MOUTH DAILY 270 tablet 3  . glimepiride (AMARYL) 1 MG tablet TAKE 1 TABLET BY MOUTH DAILY BEFORE BREAKFAST. 90 tablet 3  . glucose blood (ACCU-CHEK AVIVA) test strip Test once per day and diagnosis code is E 11.9 100 each 1  . HYDROcodone-acetaminophen (NORCO) 10-325 MG tablet Take 1 tablet by mouth every 6 (six) hours as needed for moderate pain. 120 tablet 0  . HYDROcodone-homatropine (HYDROMET) 5-1.5 MG/5ML syrup Take 5 mLs by mouth every 4 (four) hours as needed. 240 mL 0  . hydrocortisone (PROCTOSOL HC) 2.5 % rectal cream Place rectally 3 (three) times daily. 28.35 g 3  . hydrOXYzine (ATARAX/VISTARIL) 25 MG tablet TAKE 1 TABLET EVERY 4 HOURS AS NEEDED FOR ITCHING 50 tablet 0  . isosorbide mononitrate (IMDUR) 60 MG 24 hr tablet TAKE 1 TABLET BY MOUTH DAILY, PLEASE SCHEDULE NOVEMBER 2018 FOLLOW-UP [phone] 90 tablet 3  . levocetirizine (XYZAL) 5 MG tablet TAKE 1 TABLET (5 MG TOTAL) BY MOUTH EVERY EVENING. 90 tablet 3  . LIDODERM 5 % APPLY 1 PATCH TO SKIN FOR 12 HOURS THEN REMOVE AND DISCARD PATCH WITHIN 72 HOURS OR AS DIRECTED 30 patch 2  . loperamide  (IMODIUM) 2 MG capsule Take 4-6 mg by mouth daily.    Marland Kitchen losartan (COZAAR) 50 MG tablet Take 1 tablet (50 mg total) by mouth daily. 90 tablet 3  . magnesium oxide (MAG-OX) 400 MG tablet TAKE 1 TABLET BY MOUTH EVERY DAY 90 tablet 0  . Mesalamine (DELZICOL) 400 MG CPDR DR capsule Take 4 capsules (1,600 mg total) by mouth 3 (three) times daily. 360 capsule 11  . metFORMIN (GLUCOPHAGE) 1000 MG tablet Take 1 tablet (1,000 mg total) by mouth 2 (two) times daily with a  meal. 180 tablet 3  . montelukast (SINGULAIR) 10 MG tablet TAKE 1 TABLET BY MOUTH EVERYDAY AT BEDTIME 90 tablet 0  . nabumetone (RELAFEN) 750 MG tablet TAKE 1 TABLET (750 MG) BY MOUTH TWICE A DAY WITH A MEAL 180 tablet 3  . neomycin-polymyxin-hydrocortisone (CORTISPORIN) otic solution Use 1 drop in affected ear three times daily as needed 10 mL 0  . nitroGLYCERIN (NITROSTAT) 0.4 MG SL tablet Place 1 tablet (0.4 mg total) under the tongue every 5 (five) minutes as needed for chest pain. 25 tablet 11  . Olopatadine HCl (PAZEO) 0.7 % SOLN Place 1 drop into both eyes daily as needed (itching/allergies).    . ondansetron (ZOFRAN) 4 MG tablet Take 1 tablet (4 mg total) by mouth every 6 (six) hours as needed for nausea or vomiting. 40 tablet 0  . pantoprazole (PROTONIX) 40 MG tablet TAKE 1 TABLET BY MOUTH TWICE DAILY 180 tablet 3  . Polyethyl Glycol-Propyl Glycol (SYSTANE OP) Place 1 drop into both eyes daily. For dry eyes    . potassium chloride SA (K-DUR,KLOR-CON) 20 MEQ tablet Take 20 mEq by mouth 2 (two) times daily.    Marland Kitchen tiZANidine (ZANAFLEX) 4 MG tablet TAKE 1 TABLET (4 MG TOTAL) BY MOUTH TWICE DAILY 90 tablet 11   Current Facility-Administered Medications  Medication Dose Route Frequency Provider Last Rate Last Dose  . ipratropium-albuterol (DUONEB) 0.5-2.5 (3) MG/3ML nebulizer solution 3 mL  3 mL Nebulization Once Dorothyann Peng, NP        Allergies:   Doxycycline; Amoxicillin; Aspirin; Azithromycin; Caffeine; Ciprofloxacin; Codeine;  Sulfamethizole; and Tramadol hcl    Social History:  The patient  reports that she quit smoking about 44 years ago. Her smoking use included cigarettes. She has a 30.00 pack-year smoking history. she has never used smokeless tobacco. She reports that she drinks alcohol. She reports that she does not use drugs.   Family History:  [name] family history includes Breast cancer in her daughter; Diabetes in her brother and sister; Heart attack in her father; Hypertension in her sister; Parkinsonism in her mother; Pneumonia in her father; Sarcoidosis in her brother.    ROS:  Please see the history of present illness.   All others neg.   PHYSICAL EXAM: VS:  BP 118/74   Pulse 69   Ht 5' (1.524 m)   Wt 213 lb 12.8 oz (97 kg)   LMP  (LMP Unknown)   BMI 41.75 kg/m  , BMI Body mass index is 41.75 kg/m. GEN: obese Well nourished, well developed, in no acute distress  HEENT: normal  Neck: no JVD, carotid bruits, or masses Cardiac: RRR; no murmurs, rubs, or gallops, 2+ BLE/pedal edema  Respiratory:  clear to auscultation bilaterally, normal work of breathing GI: soft, nontender, nondistended, + BS MS: no deformity or atrophy  Skin: warm and dry, no rash Neuro:  Alert and Oriented x 3, Strength and sensation are intact Psych: euthymic mood, full affect    EKG:  EKG today 03/19/16-sinus rhythm, 72, vertical axis, poor R-wave progression personally viewed-no significant change from prior 09/29/15-normal sinus rhythm, 70, biatrial enlargement, no other abnormalities. Personally viewed  Recent Labs: 06/25/2016: ALT 5; TSH 1.08 12/16/2016: BUN 10; Creatinine, Ser 0.52; Potassium 3.9; Sodium 139 04/02/2017: Hemoglobin 12.4; Platelets 473.0    Lipid Panel    Component Value Date/Time   CHOL 136 06/25/2016 1015   TRIG 100.0 06/25/2016 1015   HDL 52.50 06/25/2016 1015   CHOLHDL 3 06/25/2016 1015   VLDL  20.0 06/25/2016 1015   LDLCALC 64 06/25/2016 1015      Wt Readings from Last 3  Encounters:  06/04/17 213 lb 12.8 oz (97 kg)  05/15/17 208 lb 12.8 oz (94.7 kg)  05/09/17 211 lb 3.2 oz (95.8 kg)      Other studies Reviewed: Additional studies/ records that were reviewed today include: Prior office notes, lab work, catheterization reports. Review of the above records demonstrates: As above   ASSESSMENT AND PLAN:  Coronary artery disease -2002 proximal LAD bare-metal stent, express stent, Dr. Olevia Perches. -3 subsequent catheterizations reassuring. Last catheterization 2013. -No symptoms currently. Doing well. Secondary prevention. -Moderate disease treated medically in other vessels. No changes  Aspirin intolerance -Stomach issues if taking aspirin -Continue Plavix lifelong. No change  Morbid Obesity -Encourage weight loss.This is been trouble at times with her back pain. She is to like walking in the neighborhood but this is hard for her to do now. States not interested in food. Still needs to try to loose weight.   Ulcerative colitis -Dr. Fuller Plan - PPI, meds reviewed. This has been hard on her.   Chronic diastolic heart failure -Occasional diuretic. Fluid restriction, salt restriction. Normal renal function. In part secondary to obesity. Stable.   Hyperlipidemia -Atorvastatin, LDL less than 70. Continue.   Essential hypertension -Multidrug regimen reviewed. Good blood pressure control. No change  Disposition:   FU with Mickel Baas in 6 months Kessler Kopinski in 12 months  Signed, Candee Furbish, MD  06/04/2017 9:45 AM    [facility] [place], [place], [place]  [address] Phone: [phone]; Fax: [phone]

## 2017-06-05 DIAGNOSIS — M5106 Intervertebral disc disorders with myelopathy, lumbar region: Secondary | ICD-10-CM | POA: Diagnosis not present

## 2017-06-07 ENCOUNTER — Telehealth: Payer: Self-pay | Admitting: Gastroenterology

## 2017-06-07 ENCOUNTER — Other Ambulatory Visit: Payer: Self-pay

## 2017-06-07 MED ORDER — PRAMOXINE HCL 1 % RE FOAM: RECTAL | 0 refills | Status: DC

## 2017-06-07 NOTE — Telephone Encounter (Signed)
Ok to refill proctofoam daily for 1 week then prn

## 2017-06-07 NOTE — Telephone Encounter (Signed)
Rx refilled for Proctofoam. Instructed patient that if she did not hear back from [place] then to check with her pharmacy.

## 2017-06-07 NOTE — Telephone Encounter (Signed)
Spoke to patient, she started having some rectal bleeding yesterday upon bowel movements. She tried to refill her proctofoam, but pharmacy told her it was too old of an Rx. She is having no other symptoms, bleeding stops after using tissue to clean.

## 2017-06-17 ENCOUNTER — Telehealth: Payer: Self-pay | Admitting: Family Medicine

## 2017-06-17 NOTE — Telephone Encounter (Signed)
Spoke with XFGHWE at [facility]; wil route request to office  zanaflex refill Last OV: 01/03/17 Last Refill: ? [facility]: [facility] [address] [place], [place]

## 2017-06-17 NOTE — Telephone Encounter (Signed)
Copied from [place]. Topic: Quick Communication - Rx Refill/Question >> Jun 17, 2017  9:46 AM Percell Belt A wrote: Medication: tiZANidine (ZANAFLEX) 4 MG tablet [[id number]]    Has the patient contacted their pharmacy? No    (Agent: If no, request that the patient contact the pharmacy for the refill.)   Preferred Pharmacy (with phone number or street name):  [facility]   Agent: Please be advised that RX refills may take up to 3 business days. We ask that you follow-up with your pharmacy.

## 2017-06-19 ENCOUNTER — Telehealth: Payer: Self-pay | Admitting: Family Medicine

## 2017-06-19 NOTE — Telephone Encounter (Signed)
Copied from [place] [id number]. Topic: Quick Communication - Rx Refill/Question >> Jun 19, 2017  9:53 AM Cecelia Byars, NT wrote: Medication: tiZANidine (ZANAFLEX) 4 MG tablet Has the patient contacted their pharmacy? yes (Agent: If no, request that the patient contact the pharmacy for the refill.) Preferred Pharmacy (with phone number or street name): [id number] [place] [place] [phone](Phone) [phone](Fax) Agent: Please be advised that RX refills may take up to 3 business days. We ask that you follow-up with your pharmacy. Patient says Dr FSarajane Jewshas filled this prescription for her in the past ,please call [phone] with any questions

## 2017-06-19 NOTE — Telephone Encounter (Signed)
Refill of Zanaflex  LOV 02/21/17  LRF  12/17/16   #90   11 refills  [place], [place] [facility] [place] [phone] (Phone) [phone] (Fax)

## 2017-06-19 NOTE — Telephone Encounter (Signed)
Sent to PCP for approval

## 2017-06-21 MED ORDER — TIZANIDINE HCL 4 MG PO TABS: ORAL_TABLET | ORAL | 11 refills | Status: DC

## 2017-06-21 NOTE — Telephone Encounter (Signed)
Call in #90 with 11 rf

## 2017-06-21 NOTE — Telephone Encounter (Signed)
Rx has been called in

## 2017-07-01 ENCOUNTER — Ambulatory Visit: Payer: Medicare Other | Admitting: Gastroenterology

## 2017-07-05 ENCOUNTER — Telehealth: Payer: Self-pay

## 2017-07-05 NOTE — Telephone Encounter (Signed)
   [facility] Pre-operative Risk Assessment    Request for surgical clearance:  1. What type of surgery is being performed? Lumbar Epidural Steroid Injection  2. When is this surgery scheduled? TBD   3. What type of clearance is required (medical clearance vs. Pharmacy clearance to hold med vs. Both)? Both  4. Are there any medications that need to be held prior to surgery and how long? How many days prior to injection can pt stop plavix    5. Practice name and name of physician performing surgery? [facility], Dr.Goochland Ibazebo  6. What is your office phone and fax number? Phone: [phone] Fax: [phone]   7. Anesthesia type (None, local, MAC, general) ? None Specified    [name] 07/05/2017, 1:56 PM  _________________________________________________________________   (provider comments below)

## 2017-07-08 NOTE — Telephone Encounter (Signed)
   Primary Cardiologist: Dr. Marlou Porch  Chart reviewed as part of pre-operative protocol coverage. Patient was contacted 07/08/2017 in reference to pre-operative risk assessment for pending surgery as outlined below.  [name] was last seen on 06/04/17 by Dr. Marlou Porch and was doing well from a cardiac standpoint.  Since that day, [name] has continued to do well w/o ischemic symptoms. No exertional CP or dyspnea.   Therefore, based on ACC/AHA guidelines, the patient would be at acceptable risk for the planned procedure without further cardiovascular testing. I will route to Dr. Marlou Porch to see if ok to hold Plavix for 5-7 days prior to spinal injection.     Lyda Jester, PA-C 07/08/2017, 4:00 PM

## 2017-07-10 ENCOUNTER — Other Ambulatory Visit: Payer: Self-pay | Admitting: Family Medicine

## 2017-07-10 NOTE — Telephone Encounter (Signed)
Yes ok to hold plavix for 5-7 days prior to spinal injection Candee Furbish, MD

## 2017-07-11 ENCOUNTER — Other Ambulatory Visit: Payer: Self-pay

## 2017-07-11 MED ORDER — METFORMIN HCL 1000 MG PO TABS: 1000.0000 mg | ORAL_TABLET | Freq: Two times a day (BID) | ORAL | 2 refills | Status: DC

## 2017-07-17 DIAGNOSIS — R3914 Feeling of incomplete bladder emptying: Secondary | ICD-10-CM | POA: Diagnosis not present

## 2017-07-17 DIAGNOSIS — N3946 Mixed incontinence: Secondary | ICD-10-CM | POA: Diagnosis not present

## 2017-07-17 DIAGNOSIS — D3002 Benign neoplasm of left kidney: Secondary | ICD-10-CM | POA: Diagnosis not present

## 2017-07-17 DIAGNOSIS — N281 Cyst of kidney, acquired: Secondary | ICD-10-CM | POA: Diagnosis not present

## 2017-08-01 ENCOUNTER — Telehealth: Payer: Self-pay

## 2017-08-01 DIAGNOSIS — M545 Low back pain: Secondary | ICD-10-CM | POA: Diagnosis not present

## 2017-08-01 DIAGNOSIS — M5106 Intervertebral disc disorders with myelopathy, lumbar region: Secondary | ICD-10-CM | POA: Diagnosis not present

## 2017-08-01 NOTE — Telephone Encounter (Signed)
[facility]   Requesting 90 day supply   Last OV 02/21/2017   Last refilled 05/31/2017 disp 50 with no refills   Sent to PCP for approval

## 2017-08-01 NOTE — Telephone Encounter (Signed)
Call in Hydroxyzine 25 mg to take every 6 hours prn itching, #360 with 3 rf

## 2017-08-02 MED ORDER — HYDROXYZINE PAMOATE 25 MG PO CAPS: 25.0000 mg | ORAL_CAPSULE | Freq: Four times a day (QID) | ORAL | 3 refills | Status: DC | PRN

## 2017-08-02 NOTE — Addendum Note (Signed)
Addended by: Myriam Forehand on: 08/02/2017 02:56 PM   Modules accepted: Orders

## 2017-08-02 NOTE — Telephone Encounter (Signed)
Rx has been sent into pt's pharmacy. Called and spoke with pt. Pt advised and voiced understanding.

## 2017-08-07 ENCOUNTER — Other Ambulatory Visit: Payer: Self-pay | Admitting: Family Medicine

## 2017-08-12 ENCOUNTER — Other Ambulatory Visit: Payer: Self-pay | Admitting: Family Medicine

## 2017-08-13 ENCOUNTER — Ambulatory Visit (INDEPENDENT_AMBULATORY_CARE_PROVIDER_SITE_OTHER): Payer: Medicare Other | Admitting: Gastroenterology

## 2017-08-13 ENCOUNTER — Encounter: Payer: Self-pay | Admitting: Gastroenterology

## 2017-08-13 ENCOUNTER — Ambulatory Visit: Payer: Medicare Other | Admitting: Sports Medicine

## 2017-08-13 VITALS — BP 130/82 | HR 78 | Ht 60.0 in | Wt 203.4 lb

## 2017-08-13 DIAGNOSIS — K580 Irritable bowel syndrome with diarrhea: Secondary | ICD-10-CM | POA: Diagnosis not present

## 2017-08-13 DIAGNOSIS — I2583 Coronary atherosclerosis due to lipid rich plaque: Secondary | ICD-10-CM | POA: Diagnosis not present

## 2017-08-13 DIAGNOSIS — K5130 Ulcerative (chronic) rectosigmoiditis without complications: Secondary | ICD-10-CM | POA: Diagnosis not present

## 2017-08-13 DIAGNOSIS — I2510 Atherosclerotic heart disease of native coronary artery without angina pectoris: Secondary | ICD-10-CM

## 2017-08-13 DIAGNOSIS — K219 Gastro-esophageal reflux disease without esophagitis: Secondary | ICD-10-CM | POA: Diagnosis not present

## 2017-08-13 MED ORDER — GLYCOPYRROLATE 1 MG PO TABS: 1.0000 mg | ORAL_TABLET | Freq: Two times a day (BID) | ORAL | 11 refills | Status: DC

## 2017-08-13 NOTE — Patient Instructions (Signed)
We have sent the following medications to your pharmacy for you to pick up at your convenience:robinul.   If too expensive then contact your insurance company to find out which anti-spasmodic is covered and is cheaper.   You will be due for a recall colonoscopy in 10/2017. We will send you a reminder in the mail when it gets closer to that time.  Thank you for choosing me and [facility].  Pricilla Riffle. Dagoberto Ligas., MD., Marval Regal

## 2017-08-13 NOTE — Progress Notes (Signed)
History of Present Illness: This is a 77 year old female with left sided UC.  She is accompanied by her son.  Stopped lomotil as she felt it lead to fatigue and stopped dicyclomine due to increase cost.  She has been taking Imodium with good control of diarrhea.  She has intermittent abdominal pain and intermittent postprandial urgency.  She denies rectal bleeding.  Her appetite is good and her weight is stable.  Current Medications, Allergies, Past Medical History, Past Surgical History, Family History and Social History were reviewed in [employer] record.  Physical Exam: General: Well developed, well nourished, no acute distress Head: Normocephalic and atraumatic Eyes:  sclerae anicteric, EOMI Ears: Normal auditory acuity Mouth: No deformity or lesions Lungs: Clear throughout to auscultation Heart: Regular rate and rhythm; no murmurs, rubs or bruits Abdomen: Soft, non tender and non distended. No masses, hepatosplenomegaly or hernias noted. Normal Bowel sounds Rectal: Deferred to colonoscopy. Musculoskeletal: Symmetrical with no gross deformities  Pulses:  Normal pulses noted Extremities: No clubbing, cyanosis, edema or deformities noted Neurological: Alert oriented x 4, grossly nonfocal Psychological:  Alert and cooperative. Normal mood and affect  Assessment and Recommendations:  1.  Left-sided ulcerative colitis.  Continue Delzicol 1.6g tid. Colonoscopy due in July. Schedule colonoscopy. The risks (including bleeding, perforation, infection, missed lesions, medication reactions and possible hospitalization or surgery if complications occur), benefits, and alternatives to colonoscopy with possible biopsy and possible polypectomy were discussed with the patient and they consent to proceed.   2.  IBS.  Trial of glycopyrrolate 1 mg twice daily as needed in place of dicyclomine.  Continue Imodium tid as needed.  3. Hold Plavix 5 days before procedure - will  instruct when and how to resume after procedure. Low but real risk of cardiovascular event such as heart attack, stroke, embolism, thrombosis or ischemia/infarct of other organs off Plavix explained and need to seek urgent help if this occurs. The patient consents to proceed. Will communicate by phone or EMR with patient's prescribing provider to confirm that holding Plavix is reasonable in this case.   4 GERD.  Continue pantoprazole 40 mg twice daily.  Follow standard antireflux measures.

## 2017-08-16 ENCOUNTER — Other Ambulatory Visit: Payer: Self-pay | Admitting: Family Medicine

## 2017-08-20 ENCOUNTER — Encounter: Payer: Self-pay | Admitting: Sports Medicine

## 2017-08-20 ENCOUNTER — Ambulatory Visit (INDEPENDENT_AMBULATORY_CARE_PROVIDER_SITE_OTHER): Payer: Medicare Other | Admitting: Sports Medicine

## 2017-08-20 DIAGNOSIS — E119 Type 2 diabetes mellitus without complications: Secondary | ICD-10-CM

## 2017-08-20 DIAGNOSIS — L309 Dermatitis, unspecified: Secondary | ICD-10-CM

## 2017-08-20 DIAGNOSIS — M79672 Pain in left foot: Secondary | ICD-10-CM

## 2017-08-20 DIAGNOSIS — E1151 Type 2 diabetes mellitus with diabetic peripheral angiopathy without gangrene: Secondary | ICD-10-CM

## 2017-08-20 DIAGNOSIS — M79671 Pain in right foot: Secondary | ICD-10-CM

## 2017-08-20 DIAGNOSIS — B351 Tinea unguium: Secondary | ICD-10-CM

## 2017-08-20 MED ORDER — CLOBETASOL PROPIONATE 0.05 % EX CREA: 1.0000 "application " | TOPICAL_CREAM | Freq: Every day | CUTANEOUS | 0 refills | Status: DC

## 2017-08-20 NOTE — Patient Instructions (Signed)
Use Eucerin lotion which you can get over the counter in the morning  Use Clobetasol at bedtime

## 2017-08-20 NOTE — Progress Notes (Signed)
Patient ID: [name], female   DOB: 1940-11-18, 77 y.o.   MRN: [id number]  Subjective: [name] is a 77 y.o. female patient with history of diabetes who returns to office today complaining of long, painful nails  while ambulating in shoes; unable to trim.  Patient states that the glucose reading this morning was not recorded today but has been "good", last a1c was 6 like before. Admits to itchy rash on feet; Previous creams did not help.   Patient Active Problem List   Diagnosis Date Noted  . Fibromyalgia 01/03/2017  . Left sided colitis ([place]) 05/13/2015  . Diarrhea 08/04/2013  . Back pain, chronic 02/06/2012  . Hyperlipidemia 12/07/2011  . Hematuria 11/27/2011  . Ulcerative colitis ([place]) 11/25/2011  . Chronic diastolic CHF (congestive heart failure) ([place]) 11/25/2011  . Lower extremity edema 11/25/2011  . Nonischemic cardiomyopathy ([place]) 04/17/2011  . CAD (coronary artery disease)   . Bronchiectasis ([place])   . Essential hypertension   . VITAMIN D DEFICIENCY 12/03/2009  . Depression 08/10/2008  . PULMONARY NODULE 07/15/2008  . NEOPLASM, KIDNEY 03/16/2008  . STRESS INCONTINENCE 03/16/2008  . COLONIC POLYPS 07/19/2007  . Diabetes mellitus without complication ([place]) 53/66/4403  . OBESITY 07/19/2007  . ANEMIA 07/19/2007  . Asthma 07/19/2007  . DIVERTICULOSIS OF COLON 07/19/2007  . ANXIETY 03/19/2007  . GERD 03/19/2007  . Osteoarthritis 03/19/2007  . FIBROMYALGIA 03/19/2007  . Allergic state 03/19/2007   Current Outpatient Medications on File Prior to Visit  Medication Sig Dispense Refill  . ACCU-CHEK AVIVA PLUS test strip TEST ONCE PER DAY AND DIAGNOSIS CODE IS E 11.9 100 each 1  . ACCU-CHEK FASTCLIX LANCETS MISC Test once per day and diagnosis code is E 11.9 102 each 1  . acetaminophen (TYLENOL) 500 MG tablet Take 500 mg by mouth every 6 (six) hours as needed for headache (pain).     Marland Kitchen albuterol (PROVENTIL) (2.5 MG/3ML) 0.083% nebulizer solution Take 3 mLs (2.5 mg  total) by nebulization every 4 (four) hours as needed for wheezing or shortness of breath. 75 mL 3  . albuterol (VENTOLIN HFA) 108 (90 BASE) MCG/ACT inhaler Inhale 2 puffs into the lungs every 4 (four) hours as needed for wheezing or shortness of breath. 18 each 11  . ALPRAZolam (XANAX) 0.5 MG tablet TAKE 1 TABLET BY MOUTH 3 TIMES A DAY 90 tablet 5  . AMBULATORY NON FORMULARY MEDICATION Medication Name: Budesonide 5 mg Take 2 tablets by mouth daily 60 tablet 2  . amLODipine (NORVASC) 10 MG tablet TAKE 1 TABLET BY MOUTH EVERY DAY 90 tablet 3  . atenolol (TENORMIN) 50 MG tablet Take 1 tablet (50 mg total) by mouth daily. 90 tablet 3  . atorvastatin (LIPITOR) 10 MG tablet TAKE 1 TABLET BY MOUTH DAILY. 90 tablet 3  . azelastine (ASTELIN) 0.1 % nasal spray Place 1 spray into both nostrils at bedtime.    . betamethasone valerate (VALISONE) 0.1 % cream Apply topically 2 (two) times daily. 30 g 2  . budesonide-formoterol (SYMBICORT) 160-4.5 MCG/ACT inhaler Inhale 2 puffs into the lungs 2 (two) times daily.    . Cholecalciferol (VITAMIN D-3) 5000 UNITS TABS Take 5,000 Units by mouth daily.      . clopidogrel (PLAVIX) 75 MG tablet TAKE 1 TABLET BY MOUTH EVERY DAY 90 tablet 3  . clotrimazole (LOTRIMIN) 1 % external solution Apply 1 application topically 2 (two) times daily. In between toes 60 mL 5  . diclofenac sodium (VOLTAREN) 1 % GEL Apply 2 g topically 4 (  four) times daily.    Marland Kitchen dicyclomine (BENTYL) 10 MG capsule Take 1 capsule (10 mg total) by mouth 3 (three) times daily before meals. 90 capsule 11  . fluticasone (FLONASE) 50 MCG/ACT nasal spray PLACE 2 SPRAYS IN EACH NOSTRIL AT BEDTIME 16 g 6  . furosemide (LASIX) 40 MG tablet TAKE 2 TABLETS (80 MG) BY MOUTH DAILY 270 tablet 3  . glimepiride (AMARYL) 1 MG tablet TAKE 1 TABLET BY MOUTH DAILY BEFORE BREAKFAST. 90 tablet 3  . glycopyrrolate (ROBINUL) 1 MG tablet Take 1 tablet (1 mg total) by mouth 2 (two) times daily. 60 tablet 11  .  HYDROcodone-acetaminophen (NORCO) 10-325 MG tablet Take 1 tablet by mouth every 6 (six) hours as needed for moderate pain. 120 tablet 0  . HYDROcodone-homatropine (HYDROMET) 5-1.5 MG/5ML syrup Take 5 mLs by mouth every 4 (four) hours as needed. 240 mL 0  . hydrocortisone (PROCTOSOL HC) 2.5 % rectal cream Place rectally 3 (three) times daily. 28.35 g 3  . hydrOXYzine (ATARAX/VISTARIL) 25 MG tablet TAKE 1 TABLET EVERY 4 HOURS AS NEEDED FOR ITCHING 50 tablet 0  . hydrOXYzine (VISTARIL) 25 MG capsule Take 1 capsule (25 mg total) by mouth every 6 (six) hours as needed. 360 capsule 3  . isosorbide mononitrate (IMDUR) 60 MG 24 hr tablet TAKE 1 TABLET BY MOUTH DAILY, PLEASE SCHEDULE NOVEMBER 2018 FOLLOW-UP [phone] 90 tablet 3  . levocetirizine (XYZAL) 5 MG tablet TAKE 1 TABLET (5 MG TOTAL) BY MOUTH EVERY EVENING. 90 tablet 3  . LIDODERM 5 % APPLY 1 PATCH TO SKIN FOR 12 HOURS THEN REMOVE AND DISCARD PATCH WITHIN 72 HOURS OR AS DIRECTED 30 patch 2  . loperamide (IMODIUM) 2 MG capsule Take 4-6 mg by mouth daily.    Marland Kitchen losartan (COZAAR) 50 MG tablet Take 1 tablet (50 mg total) by mouth daily. 90 tablet 3  . magnesium oxide (MAG-OX) 400 MG tablet TAKE 1 TABLET BY MOUTH EVERY DAY 90 tablet 0  . Mesalamine (DELZICOL) 400 MG CPDR DR capsule Take 4 capsules (1,600 mg total) by mouth 3 (three) times daily. 360 capsule 11  . metFORMIN (GLUCOPHAGE) 1000 MG tablet Take 1 tablet (1,000 mg total) by mouth 2 (two) times daily with a meal. 180 tablet 2  . montelukast (SINGULAIR) 10 MG tablet TAKE 1 TABLET BY MOUTH EVERYDAY AT BEDTIME 90 tablet 0  . nabumetone (RELAFEN) 750 MG tablet TAKE 1 TABLET (750 MG) BY MOUTH TWICE A DAY WITH A MEAL 180 tablet 3  . neomycin-polymyxin-hydrocortisone (CORTISPORIN) otic solution Use 1 drop in affected ear three times daily as needed 10 mL 0  . nitroGLYCERIN (NITROSTAT) 0.4 MG SL tablet Place 1 tablet (0.4 mg total) under the tongue every 5 (five) minutes as needed for chest pain. 25  tablet 11  . Olopatadine HCl (PAZEO) 0.7 % SOLN Place 1 drop into both eyes daily as needed (itching/allergies).    . ondansetron (ZOFRAN) 4 MG tablet Take 1 tablet (4 mg total) by mouth every 6 (six) hours as needed for nausea or vomiting. 40 tablet 0  . pantoprazole (PROTONIX) 40 MG tablet TAKE 1 TABLET BY MOUTH TWICE DAILY 180 tablet 3  . Polyethyl Glycol-Propyl Glycol (SYSTANE OP) Place 1 drop into both eyes daily. For dry eyes    . potassium chloride SA (K-DUR,KLOR-CON) 20 MEQ tablet Take 20 mEq by mouth 2 (two) times daily.    . pramoxine (PROCTOFOAM) 1 % foam Place one applicator rectally daily for 7 days, then use as  needed. 15 g 0  . tiZANidine (ZANAFLEX) 4 MG tablet TAKE 1 TABLET (4 MG TOTAL) BY MOUTH TWICE DAILY 90 tablet 11   Current Facility-Administered Medications on File Prior to Visit  Medication Dose Route Frequency Provider Last Rate Last Dose  . ipratropium-albuterol (DUONEB) 0.5-2.5 (3) MG/3ML nebulizer solution 3 mL  3 mL Nebulization Once Dorothyann Peng, NP       Allergies  Allergen Reactions  . Doxycycline Other (See Comments)    Unsteady gait  . Amoxicillin Other (See Comments)    Unknown reaction   . Aspirin Nausea And Vomiting  . Azithromycin Other (See Comments)    REACTION: pt states "ZPak doesn't work"  . Caffeine Nausea And Vomiting  . Ciprofloxacin Nausea And Vomiting  . Codeine Nausea And Vomiting  . Sulfamethizole Other (See Comments)    Unknown reaction  . Tramadol Hcl Other (See Comments)    REACTION: pt states "spaced-out"    No results found for this or any previous visit (from the past 2160 hour(s)).  Objective: General: Patient is awake, alert, and oriented x 3 and in no acute distress.  Integument: Skin is warm, dry and supple bilateral. Nails are tender, long, thickened and dystrophic with subungual debris, consistent with onychomycosis, 1-5 bilateral. There is puritic superimposed scaly skin L>R foot. No open lesions or preulcerative  lesions present bilateral. Remaining integument unremarkable.  Vasculature:  Dorsalis Pedis pulse 1/4 bilateral. Posterior Tibial pulse  1/4 bilateral. Capillary fill time <3 sec 1-5 bilateral. Scant hair growth to the level of the digits.Temperature gradient within normal limits. No varicosities present bilateral. No edema present bilateral.   Neurology: The patient has intact sensation measured with a 5.07/10g Semmes Weinstein Monofilament at all pedal sites bilateral. Vibratory sensation intact bilateral with tuning fork. No Babinski sign present bilateral.   Musculoskeletal:Mild asymptomatic bunion pedal deformities noted bilateral. No pain with palpation bilateral. Muscular strength 5/5 in all lower extremity muscular groups bilateral without pain on range of motion . No tenderness with calf compression bilateral.  Assessment and Plan: Problem List Items Addressed This Visit      Endocrine   Diabetes mellitus without complication ([place])    Other Visit Diagnoses    Dermatophytosis of nail    -  Primary   Diabetic peripheral vascular disease ([place])       Foot pain, bilateral       Dermatitis         -Examined patient. -Discussed and educated patient on diabetic foot care, especially with  regards to the vascular, neurological and musculoskeletal systems.  -Stressed the importance of good glycemic control and the detriment of not controlling glucose levels in relation to the foot. -Mechanically debrided all nails 1-5 bilateral using sterile nail nipper and filed with dremel without incident  -Rx Clobetasol cream to use to feet  -Answered all patient questions -Patient to return in 3 months for at risk foot care -Patient advised to call the office if any problems or questions arise in the meantime.  Landis Martins, DPM

## 2017-08-21 ENCOUNTER — Telehealth: Payer: Self-pay | Admitting: Sports Medicine

## 2017-08-21 DIAGNOSIS — M5106 Intervertebral disc disorders with myelopathy, lumbar region: Secondary | ICD-10-CM | POA: Diagnosis not present

## 2017-08-21 NOTE — Telephone Encounter (Signed)
Fluocinonide 0.1 % Topical Cream once daily to feet

## 2017-08-21 NOTE — Telephone Encounter (Signed)
[facility] called saying pt had a cream e-scribed yesterday and it is not covered by insurance. Stated they faxed something to [place] and was calling to make sure we got it as the pt is in the pharmacy. I asked our nurse Marcy Siren if we had anything and she stated we did. I told the pharmacist we did get it and that we are working on it and also waiting on a response from Dr. Cannon Kettle who is working in our [employer] office today. Told once we got a response from her we would get it faxed back over.

## 2017-08-22 MED ORDER — FLUOCINONIDE 0.1 % EX CREA: TOPICAL_CREAM | CUTANEOUS | 0 refills | Status: DC

## 2017-08-22 NOTE — Addendum Note (Signed)
Addended by: Harriett Sine D on: 08/22/2017 04:13 PM   Modules accepted: Orders

## 2017-08-22 NOTE — Telephone Encounter (Signed)
Orders to [facility].

## 2017-08-27 ENCOUNTER — Other Ambulatory Visit: Payer: Self-pay | Admitting: Family Medicine

## 2017-08-27 ENCOUNTER — Telehealth: Payer: Self-pay | Admitting: *Deleted

## 2017-08-27 NOTE — Telephone Encounter (Signed)
Request for alternate medication for Fluocinonide 0.1% cream.

## 2017-08-27 NOTE — Telephone Encounter (Signed)
Last OV 02/21/2017   Rx is listed as historical   Sent to PCP for approval

## 2017-08-27 NOTE — Telephone Encounter (Signed)
We can see if the pharmacist has any recommendations for an alterative that the patient can get  Or change to Ketoconazole 2% cream Thanks Dr. Chauncey Cruel

## 2017-08-28 MED ORDER — KETOCONAZOLE 2 % EX CREA: 1.0000 "application " | TOPICAL_CREAM | Freq: Every day | CUTANEOUS | 1 refills | Status: DC

## 2017-08-28 NOTE — Telephone Encounter (Signed)
I informed [name] Dr. Cannon Kettle had changed the medication cream to one that either her insurance would cover or would be more affordable.

## 2017-09-10 ENCOUNTER — Emergency Department (HOSPITAL_COMMUNITY): Payer: Medicare Other

## 2017-09-10 ENCOUNTER — Emergency Department (HOSPITAL_COMMUNITY)
Admission: EM | Admit: 2017-09-10 | Discharge: 2017-09-10 | Disposition: A | Discharge status: Home / Self Care | Payer: Medicare Other | Attending: Emergency Medicine | Admitting: Emergency Medicine

## 2017-09-10 ENCOUNTER — Other Ambulatory Visit: Payer: Self-pay

## 2017-09-10 DIAGNOSIS — I110 Hypertensive heart disease with heart failure: Secondary | ICD-10-CM | POA: Insufficient documentation

## 2017-09-10 DIAGNOSIS — I2510 Atherosclerotic heart disease of native coronary artery without angina pectoris: Secondary | ICD-10-CM | POA: Insufficient documentation

## 2017-09-10 DIAGNOSIS — Z79899 Other long term (current) drug therapy: Secondary | ICD-10-CM | POA: Insufficient documentation

## 2017-09-10 DIAGNOSIS — M25551 Pain in right hip: Secondary | ICD-10-CM | POA: Diagnosis not present

## 2017-09-10 DIAGNOSIS — Z87891 Personal history of nicotine dependence: Secondary | ICD-10-CM | POA: Insufficient documentation

## 2017-09-10 DIAGNOSIS — I509 Heart failure, unspecified: Secondary | ICD-10-CM | POA: Diagnosis not present

## 2017-09-10 DIAGNOSIS — I5032 Chronic diastolic (congestive) heart failure: Secondary | ICD-10-CM | POA: Diagnosis not present

## 2017-09-10 DIAGNOSIS — Z7984 Long term (current) use of oral hypoglycemic drugs: Secondary | ICD-10-CM | POA: Insufficient documentation

## 2017-09-10 DIAGNOSIS — E119 Type 2 diabetes mellitus without complications: Secondary | ICD-10-CM | POA: Insufficient documentation

## 2017-09-10 LAB — BASIC METABOLIC PANEL
ANION GAP: 11 (ref 5–15)
BUN: 8 mg/dL (ref 6–20)
CALCIUM: 9.6 mg/dL (ref 8.9–10.3)
CO2: 24 mmol/L (ref 22–32)
Chloride: 102 mmol/L (ref 101–111)
Creatinine, Ser: 0.5 mg/dL (ref 0.44–1.00)
GFR calc non Af Amer: >60 mL/min (ref >60)
Glucose, Bld: 173 mg/dL — ABNORMAL HIGH (ref 65–99)
Potassium: 4.1 mmol/L (ref 3.5–5.1)
Sodium: 137 mmol/L (ref 135–145)

## 2017-09-10 LAB — CBC
HCT: 40.3 % (ref 36.0–46.0)
HEMOGLOBIN: 13.1 g/dL (ref 12.0–15.0)
MCH: 30.7 pg (ref 26.0–34.0)
MCHC: 32.5 g/dL (ref 30.0–36.0)
MCV: 94.4 fL (ref 78.0–100.0)
Platelets: 457 10*3/uL — ABNORMAL HIGH (ref 150–400)
RBC: 4.27 MIL/uL (ref 3.87–5.11)
RDW: 13.6 % (ref 11.5–15.5)
WBC: 14.8 10*3/uL — AB (ref 4.0–10.5)

## 2017-09-10 LAB — I-STAT TROPONIN, ED: TROPONIN I, POC: 0.01 ng/mL (ref 0.00–0.08)

## 2017-09-10 LAB — C-REACTIVE PROTEIN

## 2017-09-10 LAB — SEDIMENTATION RATE: SED RATE: 20 mm/h (ref 0–22)

## 2017-09-10 MED ORDER — HYDROCODONE-ACETAMINOPHEN 5-325 MG PO TABS
1.0000 | ORAL_TABLET | Freq: Once | ORAL | Status: AC
  Administered 2017-09-10: 1 via ORAL
  Filled 2017-09-10: qty 1

## 2017-09-10 MED ORDER — OXYCODONE HCL 5 MG PO TABS
5.0000 mg | ORAL_TABLET | Freq: Once | ORAL | Status: AC
  Administered 2017-09-10: 5 mg via ORAL
  Filled 2017-09-10: qty 1

## 2017-09-10 MED ORDER — OXYCODONE HCL 5 MG PO TABS: 5.0000 mg | ORAL_TABLET | Freq: Two times a day (BID) | ORAL | 0 refills | Status: DC | PRN

## 2017-09-10 NOTE — ED Triage Notes (Signed)
Pt in from home tonight with pain that began over the weekend in her right hip/flank radiating down her right leg.  Pt also has increased swelling in her bilateral lower extremities with a history of CHF. No SHOB

## 2017-09-10 NOTE — ED Notes (Signed)
pts family member inquired how much longer until patient gets a room, this RN apologized for the wait and advised we are working to get patient a bed as soon as possible

## 2017-09-10 NOTE — ED Notes (Signed)
Patient c/o lower back pain radiating into her lower pelvic area onset 1 month ago. States she has been seeing Dr. Danella Maiers and had epidural injections that aren't working. States the last 3 days pain has been intolerable. Pain is radiating down her right leg. Normally walks with a walker.

## 2017-09-10 NOTE — ED Provider Notes (Signed)
[facility] EMERGENCY DEPARTMENT Provider Note   CSN: [id number] Arrival date & time: 09/10/17  0003    History   Chief Complaint Chief Complaint  Patient presents with  . Back Pain    HPI [name] is a 77 y.o. female.  HPI Patient is a 76 year old female with complex medical history as below, also notable for chronic pain in her back and hips, who presents today with right hip pain.  She reports that this is much worse than her typical pain.  She last saw orthopedics 2 to 3 weeks ago when she underwent bilateral injections, although she is not entirely clear where they inject, it does not sound like these are directly into her hips.  She reports that her pain has been much worse over the last 3 days.  It is making it difficult if not impossible for her to walk.  She is having difficulty getting in and out of her house.  She has had reduced appetite over this time.  She denies any fever or chills.  She has not noticed any changes to the skin.  She has not had any nausea or vomiting.  She has not had any direct trauma to the hip.  She reports a fall about a month ago, however had been walking at her baseline with a cane since that time until about 3 days ago.  Past Medical History:  Diagnosis Date  . Adenomatous colon polyp 02/2006  . Allergy, unspecified not elsewhere classified   . Anemia   . Anxiety   . Arthritis   . Bronchiectasis   . CAD (coronary artery disease)    BMS to the LAD, 2002; cath 12/07/11 patent LAD stent and mild nonobstructive disease, EF 65%; Medical management  . CHF (congestive heart failure) (HCC)    Diastolic  . Diabetes mellitus   . Diverticulosis of colon   . DJD (degenerative joint disease)   . Fibromyalgia   . GERD (gastroesophageal reflux disease)   . Hypercholesterolemia   . Hypertension   . Microscopic hematuria   . Neoplasm of kidney   . Obesity   . Other diseases of lung, not elsewhere classified   . Pinched vertebral  nerve   . Pulmonary nodule    Negative PET in 2010  . Stress incontinence, female   . Syncope   . Ulcerative colitis, left sided ([place]) 2008   Hx of  . UTI (lower urinary tract infection)   . Vitamin D deficiency disease     Patient Active Problem List   Diagnosis Date Noted  . Fibromyalgia 01/03/2017  . Left sided colitis ([place]) 05/13/2015  . Diarrhea 08/04/2013  . Back pain, chronic 02/06/2012  . Hyperlipidemia 12/07/2011  . Hematuria 11/27/2011  . Ulcerative colitis ([place]) 11/25/2011  . Chronic diastolic CHF (congestive heart failure) ([place]) 11/25/2011  . Lower extremity edema 11/25/2011  . Nonischemic cardiomyopathy ([place]) 04/17/2011  . CAD (coronary artery disease)   . Bronchiectasis ([place])   . Essential hypertension   . VITAMIN D DEFICIENCY 12/03/2009  . Depression 08/10/2008  . PULMONARY NODULE 07/15/2008  . NEOPLASM, KIDNEY 03/16/2008  . STRESS INCONTINENCE 03/16/2008  . COLONIC POLYPS 07/19/2007  . Diabetes mellitus without complication ([place]) 67/67/2094  . OBESITY 07/19/2007  . ANEMIA 07/19/2007  . Asthma 07/19/2007  . DIVERTICULOSIS OF COLON 07/19/2007  . ANXIETY 03/19/2007  . GERD 03/19/2007  . Osteoarthritis 03/19/2007  . FIBROMYALGIA 03/19/2007  . Allergic state 03/19/2007    Past Surgical History:  Procedure Laterality Date  . ABDOMINAL HYSTERECTOMY    . CARDIAC CATHETERIZATION  11/2011  . CHOLECYSTECTOMY    . COLONOSCOPY W/ BIOPSIES  11-12-12   per Dr. Fuller Plan, diverticulosis only, repeat in 5 yrs  . CORONARY STENT PLACEMENT  2002   BMS to the LAD  . ESOPHAGOGASTRODUODENOSCOPY  12/2008  . HAND SURGERY  01/2006   Right Hand Surgery by Dr. Amedeo Plenty  . LEFT HEART CATHETERIZATION WITH CORONARY ANGIOGRAM N/A 12/07/2011   Procedure: LEFT HEART CATHETERIZATION WITH CORONARY ANGIOGRAM;  Surgeon: Peter M Martinique, MD;  Location: [facility] CATH LAB;  Service: Cardiovascular;  Laterality: N/A;     OB History   None      Home Medications    Prior to Admission  medications   Medication Sig Start Date End Date Taking? Authorizing Provider  ACCU-CHEK AVIVA PLUS test strip TEST ONCE PER DAY AND DIAGNOSIS CODE IS E 11.9 08/07/17  Yes [name], MD  ACCU-CHEK FASTCLIX LANCETS MISC Test once per day and diagnosis code is E 11.9 12/02/15  Yes [name], MD  acetaminophen (TYLENOL) 500 MG tablet Take 500 mg by mouth every 6 (six) hours as needed for headache (pain).    Yes [provider]  albuterol (PROVENTIL) (2.5 MG/3ML) 0.083% nebulizer solution Take 3 mLs (2.5 mg total) by nebulization every 4 (four) hours as needed for wheezing or shortness of breath. 02/12/17  Yes [name], MD  albuterol (VENTOLIN HFA) 108 (90 BASE) MCG/ACT inhaler Inhale 2 puffs into the lungs every 4 (four) hours as needed for wheezing or shortness of breath. 05/25/14  Yes [name], MD  ALPRAZolam Duanne Moron) 0.5 MG tablet TAKE 1 TABLET BY MOUTH 3 TIMES A DAY 03/20/17  Yes [name], MD  amLODipine (NORVASC) 10 MG tablet TAKE 1 TABLET BY MOUTH EVERY DAY 02/15/17  Yes Jerline Pain, MD  atenolol (TENORMIN) 50 MG tablet Take 1 tablet (50 mg total) by mouth daily. 06/04/17  Yes Jerline Pain, MD  atorvastatin (LIPITOR) 10 MG tablet TAKE 1 TABLET BY MOUTH DAILY. 02/15/17  Yes Jerline Pain, MD  azelastine (ASTELIN) 0.1 % nasal spray Place 1 spray into both nostrils at bedtime. 12/08/16  Yes [provider]  betamethasone valerate (VALISONE) 0.1 % cream Apply topically 2 (two) times daily. 02/05/17  Yes Stover, Titorya, DPM  budesonide-formoterol (SYMBICORT) 160-4.5 MCG/ACT inhaler Inhale 2 puffs into the lungs 2 (two) times daily.   Yes [provider]  Cholecalciferol (VITAMIN D-3) 5000 UNITS TABS Take 5,000 Units by mouth daily.     Yes [provider]  clopidogrel (PLAVIX) 75 MG tablet TAKE 1 TABLET BY MOUTH EVERY DAY 02/15/17  Yes Jerline Pain, MD  diclofenac sodium (VOLTAREN) 1 % GEL Apply 2 g topically 4 (four) times daily.   Yes  [provider]  dicyclomine (BENTYL) 10 MG capsule Take 1 capsule (10 mg total) by mouth 3 (three) times daily before meals. 05/15/17  Yes Ladene Artist, MD  fluticasone [facility]) 50 MCG/ACT nasal spray PLACE 2 SPRAYS IN EACH NOSTRIL AT BEDTIME 07/26/15  Yes [name], MD  furosemide (LASIX) 40 MG tablet TAKE 2 TABLETS (80 MG) BY MOUTH DAILY 12/17/16  Yes Vann, Jessica U, DO  glimepiride (AMARYL) 1 MG tablet TAKE 1 TABLET BY MOUTH DAILY BEFORE BREAKFAST. 08/21/16  Yes [name], MD  glycopyrrolate (ROBINUL) 1 MG tablet Take 1 tablet (1 mg total) by mouth 2 (two) times daily. 08/13/17  Yes Ladene Artist, MD  HYDROcodone-acetaminophen [facility]) 10-325 MG tablet Take 1 tablet by mouth every 6 (six) hours as needed for moderate pain. 02/21/17  Yes [name], MD  HYDROcodone-homatropine (HYDROMET) 5-1.5 MG/5ML syrup Take 5 mLs by mouth every 4 (four) hours as needed. 05/14/16  Yes [name], MD  hydrOXYzine (VISTARIL) 25 MG capsule Take 1 capsule (25 mg total) by mouth every 6 (six) hours as needed. 08/02/17  Yes [name], MD  isosorbide mononitrate (IMDUR) 60 MG 24 hr tablet TAKE 1 TABLET BY MOUTH DAILY, PLEASE SCHEDULE NOVEMBER 2018 FOLLOW-UP [id number] 02/15/17  Yes Jerline Pain, MD  ketoconazole (NIZORAL) 2 % cream Apply 1 application topically daily. 08/28/17  Yes Stover, Titorya, DPM  KLOR-CON M20 20 MEQ tablet TAKE 1 TABLET (20 MEQ TOTAL) BY MOUTH 2 (TWO) TIMES DAILY. 08/30/17  Yes [name], MD  levocetirizine (XYZAL) 5 MG tablet TAKE 1 TABLET (5 MG TOTAL) BY MOUTH EVERY EVENING. 10/05/16  Yes [name], MD  LIDODERM 5 % APPLY 1 PATCH TO SKIN FOR 12 HOURS THEN REMOVE AND DISCARD PATCH WITHIN 72 HOURS OR AS DIRECTED Patient taking differently: APPLY 1 PATCH TO SKIN FOR 12 HOURS THEN REMOVE AND DISCARD PATCH WITHIN 72 HOURS AS NEEDED FOR PAIN 03/10/13  Yes Noralee Space, MD  loperamide (IMODIUM) 2 MG capsule Take 4-6 mg by mouth daily.   Yes [provider]  losartan (COZAAR) 50 MG tablet Take 1 tablet (50 mg total) by mouth daily. 06/04/17  Yes Jerline Pain, MD  magnesium oxide (MAG-OX) 400 MG tablet TAKE 1 TABLET BY MOUTH EVERY DAY 08/14/17  Yes [name], MD  Mesalamine (DELZICOL) 400 MG CPDR DR capsule Take 4 capsules (1,600 mg total) by mouth 3 (three) times daily. 05/24/16  Yes Ladene Artist, MD  metFORMIN (GLUCOPHAGE) 1000 MG tablet Take 1 tablet (1,000 mg total) by mouth 2 (two) times daily with a meal. 07/11/17  Yes [name], MD  montelukast (SINGULAIR) 10 MG tablet TAKE 1 TABLET BY MOUTH EVERYDAY AT BEDTIME 08/19/17  Yes [name], MD  nabumetone (RELAFEN) 750 MG tablet TAKE 1 TABLET (750 MG) BY MOUTH TWICE A DAY WITH A MEAL 01/03/17  Yes [name], MD  neomycin-polymyxin-hydrocortisone (CORTISPORIN) otic solution Use 1 drop in affected ear three times daily as needed 02/06/12  Yes Noralee Space, MD  nitroGLYCERIN (NITROSTAT) 0.4 MG SL tablet Place 1 tablet (0.4 mg total) under the tongue every 5 (five) minutes as needed for chest pain. 06/04/17  Yes Jerline Pain, MD  Olopatadine HCl (PAZEO) 0.7 % SOLN Place 1 drop into both eyes daily as needed (itching/allergies).   Yes [provider]  ondansetron (ZOFRAN) 4 MG tablet Take 1 tablet (4 mg total) by mouth every 6 (six) hours as needed for nausea or vomiting. 03/01/15  Yes Esterwood, Amy S, PA-C  pantoprazole (PROTONIX) 40 MG tablet TAKE 1 TABLET BY MOUTH TWICE DAILY 05/24/16  Yes Ladene Artist, MD  Polyethyl Glycol-Propyl Glycol (SYSTANE OP) Place 1 drop into both eyes daily. For dry eyes   Yes [provider]  tiZANidine (ZANAFLEX) 4 MG tablet TAKE 1 TABLET (4 MG TOTAL) BY MOUTH TWICE DAILY 06/21/17  Yes [name], MD  AMBULATORY NON FORMULARY MEDICATION Medication Name: Budesonide 5 mg Take 2 tablets by mouth daily Patient not taking: Reported on 09/10/2017 05/15/17   Ladene Artist, MD  clotrimazole (LOTRIMIN) 1 %  external solution  Apply 1 application topically 2 (two) times daily. In between toes Patient not taking: Reported on 09/10/2017 02/05/17   Landis Martins, DPM  hydrocortisone (PROCTOSOL HC) 2.5 % rectal cream Place rectally 3 (three) times daily. Patient not taking: Reported on 09/10/2017 12/17/14   Willia Craze, NP  hydrOXYzine (ATARAX/VISTARIL) 25 MG tablet TAKE 1 TABLET EVERY 4 HOURS AS NEEDED FOR ITCHING 05/31/17   [name], MD  oxyCODONE (ROXICODONE) 5 MG immediate release tablet Take 1 tablet (5 mg total) by mouth 2 (two) times daily as needed for severe pain. 09/10/17   Clifton James, MD  pramoxine (PROCTOFOAM) 1 % foam Place one applicator rectally daily for 7 days, then use as needed. Patient not taking: Reported on 09/10/2017 06/07/17   Ladene Artist, MD    Family History Family History  Problem Relation Age of Onset  . Pneumonia Father   . Heart attack Father   . Parkinsonism Mother   . Diabetes Brother   . Sarcoidosis Brother   . Hypertension Sister   . Diabetes Sister   . Breast cancer Daughter   . Colon cancer Neg Hx     Social History Social History   Tobacco Use  . Smoking status: Former Smoker    Packs/day: 2.00    Years: 15.00    Pack years: 30.00    Types: Cigarettes    Last attempt to quit: 04/30/1973    Years since quitting: 44.3  . Smokeless tobacco: Never Used  . Tobacco comment: ex-smoker: smoked for 10-15 years up to 2ppd, quit 1975.  Substance Use Topics  . Alcohol use: Yes    Alcohol/week: 0.0 oz    Comment: occ  . Drug use: No     Allergies   Doxycycline; Amoxicillin; Aspirin; Azithromycin; Caffeine; Ciprofloxacin; Codeine; Sulfamethizole; and Tramadol hcl   Review of Systems Review of Systems  Constitutional: Positive for appetite change. Negative for chills and fever.  HENT: Negative for ear pain and sore throat.   Eyes: Negative for pain and visual disturbance.  Respiratory: Negative for cough and shortness of breath.   Cardiovascular:  Negative for chest pain and palpitations.  Gastrointestinal: Negative for abdominal pain and vomiting.  Genitourinary: Negative for dysuria and hematuria.  Musculoskeletal: Positive for arthralgias. Negative for back pain.  Skin: Negative for color change and rash.  Neurological: Negative for seizures and syncope.  All other systems reviewed and are negative.    Physical Exam Updated Vital Signs BP 135/86   Pulse (!) 49   Temp 98.4 F (36.9 C) (Oral)   Resp 19   LMP  (LMP Unknown)   SpO2 94%   Physical Exam  Constitutional: She appears well-developed and well-nourished. She appears distressed.  Patient is moaning and appears uncomfortable.  HENT:  Head: Normocephalic and atraumatic.  Eyes: Conjunctivae are normal.  Neck: Neck supple.  Cardiovascular: Normal rate and regular rhythm.  No murmur heard. Pulmonary/Chest: Effort normal and breath sounds normal. No respiratory distress.  Abdominal: Soft. There is no tenderness.  Musculoskeletal: She exhibits tenderness. She exhibits no edema or deformity.  There is mild tenderness to palpation of the right posterior buttocks, right proximal lateral thigh, and right groin.  There are no skin changes to include erythema or warmth.  There is an approximately 5 cm x 5 cm mobile soft tissue mass which the patient states is chronic.  She is unable to range her right hip secondary to pain.  On passive range of  motion, there is no pain with abduction, however she does have significant pain with right hip flexion.  There is no pain with axial loading of the right hip.  Neurological: She is alert.  Skin: Skin is warm and dry.  Psychiatric: She has a normal mood and affect.  Nursing note and vitals reviewed.    ED Treatments / Results  Labs (all labs ordered are listed, but only abnormal results are displayed) Labs Reviewed  BASIC METABOLIC PANEL - Abnormal; Notable for the following components:      Result Value   Glucose, Bld 173 (*)      All other components within normal limits  CBC - Abnormal; Notable for the following components:   WBC 14.8 (*)    Platelets 457 (*)    All other components within normal limits  SEDIMENTATION RATE  C-REACTIVE PROTEIN  I-STAT TROPONIN, ED    EKG None  Radiology Dg Chest 2 View  Result Date: 09/10/2017 CLINICAL DATA:  CHF EXAM: CHEST - 2 VIEW COMPARISON:  12/16/2016 FINDINGS: Cardiomegaly with vascular congestion and mild pulmonary edema. No large effusion. Aortic atherosclerosis. No pneumothorax. IMPRESSION: Cardiomegaly with mild central vascular congestion and mild pulmonary edema. Electronically Signed   By: Donavan Foil [name]   On: 09/10/2017 01:59   Dg Hip Unilat  With Pelvis 2-3 Views Right  Result Date: 09/10/2017 CLINICAL DATA:  Right flank and hip pain EXAM: DG HIP (WITH OR WITHOUT PELVIS) 2-3V RIGHT COMPARISON:  None. FINDINGS: Soft tissue calcifications lateral to the right iliac bone. Pubic symphysis and rami are intact. No fracture or malalignment. Vascular calcifications. IMPRESSION: No acute osseous abnormality. Electronically Signed   By: Donavan Foil [name]   On: 09/10/2017 02:01    Procedures Procedures (including critical care time)  Medications Ordered in ED Medications  HYDROcodone-acetaminophen (NORCO/VICODIN) 5-325 MG per tablet 1 tablet (1 tablet Oral Given 09/10/17 0827)  oxyCODONE (Oxy IR/ROXICODONE) immediate release tablet 5 mg (5 mg Oral Given 09/10/17 1111)     Initial Impression / Assessment and Plan / ED Course  I have reviewed the triage vital signs and the nursing notes.  Pertinent labs & imaging results that were available during my care of the patient were reviewed by me and considered in my medical decision making (see chart for details).    Patient is a 77 year old female with complex medical history as above who presents with right hip pain.  She has a history of chronic lower back and bilateral hip pain.  She follows with orthopedics and  regularly gets injections.  She takes hydrocodone at home but states she has had no relief over the last 3 days.  She denies any recent falls.  No fevers or chills.  Here, her labs are notable for leukocytosis.  Her x-ray showed no acute fracture in her right hip.  On exam, she has significant pain with flexion of the hip.  There are no overlying skin changes to suggest cellulitis.  She has no pain with axial loading of her right hip.  Overall, I have very low suspicion for septic arthritis, however given her elevated white blood cell count, inflammatory markers were sent.  Her CRP is undetectable.  ESR is mildly elevated but within her typical range.  Her pain improved here with oxycodone.  Her range of motion is much improved.  She is much more comfortable.  Given the reassuring inflammatory markers, no further work-up indicated at this time for septic arthritis.  She does have a  follow-up appointment with orthopedics tomorrow afternoon.  I will prescribe 4 tablets of oxycodone for breakthrough pain over the next couple of days.  The family and the patient are in agreement with the plan for discharge.  I discussed the importance of follow-up and also discussed return precautions.  Patient was discharged in stable condition.  Final Clinical Impressions(s) / ED Diagnoses   Final diagnoses:  Right hip pain    ED Discharge Orders        Ordered    oxyCODONE (ROXICODONE) 5 MG immediate release tablet  2 times daily PRN     09/10/17 1321       Clifton James, MD 09/10/17 1324    Carmin Muskrat, MD 09/11/17 2144

## 2017-09-11 DIAGNOSIS — M5106 Intervertebral disc disorders with myelopathy, lumbar region: Secondary | ICD-10-CM | POA: Diagnosis not present

## 2017-09-12 NOTE — Telephone Encounter (Signed)
  This encounter was created in error - please disregard.

## 2017-09-18 DIAGNOSIS — M5106 Intervertebral disc disorders with myelopathy, lumbar region: Secondary | ICD-10-CM | POA: Diagnosis not present

## 2017-09-20 ENCOUNTER — Encounter: Payer: Self-pay | Admitting: Gastroenterology

## 2017-09-26 DIAGNOSIS — M545 Low back pain: Secondary | ICD-10-CM | POA: Diagnosis not present

## 2017-10-01 ENCOUNTER — Other Ambulatory Visit: Payer: Self-pay | Admitting: Gastroenterology

## 2017-10-02 ENCOUNTER — Telehealth: Payer: Self-pay

## 2017-10-02 NOTE — Telephone Encounter (Signed)
   [facility] Pre-operative Risk Assessment    Request for surgical clearance:  1. What type of surgery is being performed? Lumbar Epidural Steroid Injections, L3-4, L4-5   2. When is this surgery scheduled? 10/17/17   3. What type of clearance is required (medical clearance vs. Pharmacy clearance to hold med vs. Both)? Pharmacy  4. Are there any medications that need to be held prior to surgery and how long? Plavix   5. Practice name and name of physician performing surgery? [place].. Dr. Laroy Apple  6. What is your office phone number [phone] ext. [phone]    7.   What is your office fax number       [phone] attn. X-ray   8.   Anesthesia type (None, local, MAC, general) ?

## 2017-10-02 NOTE — Telephone Encounter (Signed)
   Primary Cardiologist: Candee Furbish, MD  Chart reviewed as part of pre-operative protocol coverage. Patient was contacted 10/02/2017 in reference to pre-operative risk assessment for pending surgery as outlined below.  [name] was last seen on 05/2017 by Dr. Marlou Porch  H/o CAD s/p remote PCI 2002, ulcerative colitis, anemia, anxiety, bronchiectasis, cDHF, DM, fibromyalgia, HTN, HLD, GERD, neoplasm of kidney, obesity. Last nuc 11/2016 low risk. Since that day, [name] has done well from a cardiac standpoint. Denies any new CP, dyspnea or change in cardiac status since 05/2017. Therefore, based on ACC/AHA guidelines, the patient would be at acceptable risk for the planned procedure without further cardiovascular testing.  In 06/2017 phone note Dr. Marlou Porch cleared patient to hold Plavix 5-7 days prior to spinal injection so this same principle would apply to this clearance.  I will route this recommendation to the requesting party via Epic fax function and remove from pre-op pool.  Please call with questions.  Charlie Pitter, PA-C 10/02/2017, 2:29 PM

## 2017-10-17 DIAGNOSIS — M5106 Intervertebral disc disorders with myelopathy, lumbar region: Secondary | ICD-10-CM | POA: Diagnosis not present

## 2017-10-17 DIAGNOSIS — M545 Low back pain: Secondary | ICD-10-CM | POA: Diagnosis not present

## 2017-10-18 ENCOUNTER — Telehealth: Payer: Self-pay | Admitting: Family Medicine

## 2017-10-18 NOTE — Telephone Encounter (Signed)
Pt called back I spoke with pharmacist thy told me pt still had refills on this medicine and that it wasn't time for a refill pt states that she wanted me to dismiss the message from earlier and that she may have misplaced her bottle

## 2017-10-18 NOTE — Telephone Encounter (Signed)
Copied from [place] [id number]. Topic: Quick Communication - See Telephone Encounter >> Oct 18, 2017  9:29 AM Conception Chancy, NT wrote: CRM for notification. See Telephone encounter for: 10/18/17.  Patient is calling requesting a refill on KLOR-CON M20 20 MEQ tablet. Please advise.   [id number]: [phone]: [phone]

## 2017-10-23 ENCOUNTER — Other Ambulatory Visit: Payer: Self-pay | Admitting: Family Medicine

## 2017-10-29 ENCOUNTER — Telehealth: Payer: Self-pay

## 2017-10-29 NOTE — Telephone Encounter (Signed)
Pt is scheduled to see Dr.Skains on 12/10/17

## 2017-10-29 NOTE — Telephone Encounter (Signed)
   Primary Cardiologist:[name] Marlou Porch, MD  Chart reviewed as part of pre-operative protocol coverage. Because of [name]'s past medical history and time since last visit, I called the patient to discuss any new issues. She tells me that she is not having any CP or shortness of breath but she is having swelling in her left leg. She has hx of HF and is adjusting her diuretics as she feels. She feels that she would like to be seen prior to her procedure.   Pre-op covering staff: - Please schedule appointment and call patient to inform them. - Please contact requesting surgeon's office via preferred method (i.e, phone, fax) to inform them of need for appointment prior to surgery.  [name] Perch, NP  10/29/2017, 2:02 PM

## 2017-10-29 NOTE — Telephone Encounter (Signed)
[facility] Pre-operative Risk Assessment     Request for surgical clearance:     Endoscopy Procedure  What type of surgery is being performed?     Colonoscopy  When is this surgery scheduled?     01/09/18  What type of clearance is required ?   Pharmacy  Are there any medications that need to be held prior to surgery and how long? Plavix x 5 days  Practice name and name of physician performing surgery?      [facility]  What is your office phone and fax number?      Phone- [phone]  [phone]  Anesthesia type (None, local, MAC, general) ?       MAC

## 2017-11-14 DIAGNOSIS — M5106 Intervertebral disc disorders with myelopathy, lumbar region: Secondary | ICD-10-CM | POA: Diagnosis not present

## 2017-11-19 ENCOUNTER — Ambulatory Visit (INDEPENDENT_AMBULATORY_CARE_PROVIDER_SITE_OTHER): Payer: Medicare Other | Admitting: Sports Medicine

## 2017-11-19 ENCOUNTER — Encounter: Payer: Self-pay | Admitting: Sports Medicine

## 2017-11-19 DIAGNOSIS — B351 Tinea unguium: Secondary | ICD-10-CM | POA: Diagnosis not present

## 2017-11-19 DIAGNOSIS — E1151 Type 2 diabetes mellitus with diabetic peripheral angiopathy without gangrene: Secondary | ICD-10-CM

## 2017-11-19 DIAGNOSIS — M79672 Pain in left foot: Secondary | ICD-10-CM

## 2017-11-19 DIAGNOSIS — M79671 Pain in right foot: Secondary | ICD-10-CM

## 2017-11-19 NOTE — Progress Notes (Signed)
Patient ID: [name], female   DOB: [birth year], 78 y.o.   MRN: [id number]  Subjective: [name] is a 77 y.o. female patient with history of diabetes who returns to office today complaining of long, painful nails  while ambulating in shoes; unable to trim.  Patient states that the glucose reading this morning was not recorded. Reports last a1c 6.8 and that se is doing better after having a back procedure for ruptured disk. No other issues.   Patient Active Problem List   Diagnosis Date Noted  . Fibromyalgia 01/03/2017  . Left sided colitis ([place]) 05/13/2015  . Diarrhea 08/04/2013  . Back pain, chronic 02/06/2012  . Hyperlipidemia 12/07/2011  . Hematuria 11/27/2011  . Ulcerative colitis ([place]) 11/25/2011  . Chronic diastolic CHF (congestive heart failure) ([place]) 11/25/2011  . Lower extremity edema 11/25/2011  . Nonischemic cardiomyopathy ([place]) 04/17/2011  . CAD (coronary artery disease)   . Bronchiectasis ([place])   . Essential hypertension   . VITAMIN D DEFICIENCY 12/03/2009  . Depression 08/10/2008  . PULMONARY NODULE 07/15/2008  . NEOPLASM, KIDNEY 03/16/2008  . STRESS INCONTINENCE 03/16/2008  . COLONIC POLYPS 07/19/2007  . Diabetes mellitus without complication ([place]) 17/00/1749  . OBESITY 07/19/2007  . ANEMIA 07/19/2007  . Asthma 07/19/2007  . DIVERTICULOSIS OF COLON 07/19/2007  . ANXIETY 03/19/2007  . GERD 03/19/2007  . Osteoarthritis 03/19/2007  . FIBROMYALGIA 03/19/2007  . Allergic state 03/19/2007   Current Outpatient Medications on File Prior to Visit  Medication Sig Dispense Refill  . ACCU-CHEK AVIVA PLUS test strip TEST ONCE PER DAY AND DIAGNOSIS CODE IS E 11.9 100 each 1  . ACCU-CHEK FASTCLIX LANCETS MISC Test once per day and diagnosis code is E 11.9 102 each 1  . acetaminophen (TYLENOL) 500 MG tablet Take 500 mg by mouth every 6 (six) hours as needed for headache (pain).     Marland Kitchen albuterol (PROVENTIL) (2.5 MG/3ML) 0.083% nebulizer solution Take 3 mLs (2.5 mg  total) by nebulization every 4 (four) hours as needed for wheezing or shortness of breath. 75 mL 3  . albuterol (VENTOLIN HFA) 108 (90 BASE) MCG/ACT inhaler Inhale 2 puffs into the lungs every 4 (four) hours as needed for wheezing or shortness of breath. 18 each 11  . ALPRAZolam (XANAX) 0.5 MG tablet TAKE 1 TABLET BY MOUTH 3 TIMES A DAY 90 tablet 5  . AMBULATORY NON FORMULARY MEDICATION Medication Name: Budesonide 5 mg Take 2 tablets by mouth daily 60 tablet 2  . amLODipine (NORVASC) 10 MG tablet TAKE 1 TABLET BY MOUTH EVERY DAY 90 tablet 3  . atenolol (TENORMIN) 50 MG tablet Take 1 tablet (50 mg total) by mouth daily. 90 tablet 3  . atorvastatin (LIPITOR) 10 MG tablet TAKE 1 TABLET BY MOUTH DAILY. 90 tablet 3  . azelastine (ASTELIN) 0.1 % nasal spray Place 1 spray into both nostrils at bedtime.    . betamethasone valerate (VALISONE) 0.1 % cream Apply topically 2 (two) times daily. 30 g 2  . budesonide-formoterol (SYMBICORT) 160-4.5 MCG/ACT inhaler Inhale 2 puffs into the lungs 2 (two) times daily.    . Cholecalciferol (VITAMIN D-3) 5000 UNITS TABS Take 5,000 Units by mouth daily.      . clopidogrel (PLAVIX) 75 MG tablet TAKE 1 TABLET BY MOUTH EVERY DAY 90 tablet 3  . clotrimazole (LOTRIMIN) 1 % external solution Apply 1 application topically 2 (two) times daily. In between toes 60 mL 5  . diclofenac sodium (VOLTAREN) 1 % GEL Apply 2 g topically 4 (four)  times daily.    Marland Kitchen dicyclomine (BENTYL) 10 MG capsule Take 1 capsule (10 mg total) by mouth 3 (three) times daily before meals. 90 capsule 11  . fluticasone (FLONASE) 50 MCG/ACT nasal spray PLACE 2 SPRAYS IN EACH NOSTRIL AT BEDTIME 16 g 6  . furosemide (LASIX) 40 MG tablet TAKE 2 TABLETS (80 MG) BY MOUTH DAILY 270 tablet 3  . glimepiride (AMARYL) 1 MG tablet TAKE 1 TABLET BY MOUTH DAILY BEFORE BREAKFAST. 90 tablet 3  . glycopyrrolate (ROBINUL) 1 MG tablet Take 1 tablet (1 mg total) by mouth 2 (two) times daily. 60 tablet 11  .  HYDROcodone-acetaminophen (NORCO) 10-325 MG tablet Take 1 tablet by mouth every 6 (six) hours as needed for moderate pain. 120 tablet 0  . HYDROcodone-homatropine (HYDROMET) 5-1.5 MG/5ML syrup Take 5 mLs by mouth every 4 (four) hours as needed. 240 mL 0  . hydrocortisone (PROCTOSOL HC) 2.5 % rectal cream Place rectally 3 (three) times daily. 28.35 g 3  . hydrOXYzine (ATARAX/VISTARIL) 25 MG tablet TAKE 1 TABLET EVERY 4 HOURS AS NEEDED FOR ITCHING 50 tablet 0  . hydrOXYzine (VISTARIL) 25 MG capsule Take 1 capsule (25 mg total) by mouth every 6 (six) hours as needed. 360 capsule 3  . isosorbide mononitrate (IMDUR) 60 MG 24 hr tablet TAKE 1 TABLET BY MOUTH DAILY, PLEASE SCHEDULE NOVEMBER 2018 FOLLOW-UP [phone] 90 tablet 3  . ketoconazole (NIZORAL) 2 % cream Apply 1 application topically daily. 15 g 1  . KLOR-CON M20 20 MEQ tablet TAKE 1 TABLET (20 MEQ TOTAL) BY MOUTH 2 (TWO) TIMES DAILY. 180 tablet 3  . levocetirizine (XYZAL) 5 MG tablet TAKE 1 TABLET BY MOUTH EVERY DAY IN THE EVENING 90 tablet 0  . LIDODERM 5 % APPLY 1 PATCH TO SKIN FOR 12 HOURS THEN REMOVE AND DISCARD PATCH WITHIN 72 HOURS OR AS DIRECTED (Patient taking differently: APPLY 1 PATCH TO SKIN FOR 12 HOURS THEN REMOVE AND DISCARD PATCH WITHIN 72 HOURS AS NEEDED FOR PAIN) 30 patch 2  . loperamide (IMODIUM) 2 MG capsule Take 4-6 mg by mouth daily.    Marland Kitchen losartan (COZAAR) 50 MG tablet Take 1 tablet (50 mg total) by mouth daily. 90 tablet 3  . magnesium oxide (MAG-OX) 400 MG tablet TAKE 1 TABLET BY MOUTH EVERY DAY 90 tablet 0  . Mesalamine (DELZICOL) 400 MG CPDR DR capsule Take 4 capsules (1,600 mg total) by mouth 3 (three) times daily. 360 capsule 11  . metFORMIN (GLUCOPHAGE) 1000 MG tablet Take 1 tablet (1,000 mg total) by mouth 2 (two) times daily with a meal. 180 tablet 2  . montelukast (SINGULAIR) 10 MG tablet TAKE 1 TABLET BY MOUTH EVERYDAY AT BEDTIME 90 tablet 0  . nabumetone (RELAFEN) 750 MG tablet TAKE 1 TABLET (750 MG) BY MOUTH  TWICE A DAY WITH A MEAL 180 tablet 3  . neomycin-polymyxin-hydrocortisone (CORTISPORIN) otic solution Use 1 drop in affected ear three times daily as needed 10 mL 0  . nitroGLYCERIN (NITROSTAT) 0.4 MG SL tablet Place 1 tablet (0.4 mg total) under the tongue every 5 (five) minutes as needed for chest pain. 25 tablet 11  . Olopatadine HCl (PAZEO) 0.7 % SOLN Place 1 drop into both eyes daily as needed (itching/allergies).    . ondansetron (ZOFRAN) 4 MG tablet Take 1 tablet (4 mg total) by mouth every 6 (six) hours as needed for nausea or vomiting. 40 tablet 0  . oxyCODONE (ROXICODONE) 5 MG immediate release tablet Take 1 tablet (5 mg total)  by mouth 2 (two) times daily as needed for severe pain. 4 tablet 0  . pantoprazole (PROTONIX) 40 MG tablet TAKE 1 TABLET BY MOUTH TWICE DAILY 180 tablet 3  . Polyethyl Glycol-Propyl Glycol (SYSTANE OP) Place 1 drop into both eyes daily. For dry eyes    . pramoxine (PROCTOFOAM) 1 % foam Place one applicator rectally daily for 7 days, then use as needed. 15 g 0  . tiZANidine (ZANAFLEX) 4 MG tablet TAKE 1 TABLET (4 MG TOTAL) BY MOUTH TWICE DAILY 90 tablet 11   Current Facility-Administered Medications on File Prior to Visit  Medication Dose Route Frequency Provider Last Rate Last Dose  . ipratropium-albuterol (DUONEB) 0.5-2.5 (3) MG/3ML nebulizer solution 3 mL  3 mL Nebulization Once Dorothyann Peng, NP       Allergies  Allergen Reactions  . Doxycycline Other (See Comments)    Unsteady gait  . Amoxicillin Other (See Comments)    Unknown reaction  Has patient had a PCN reaction causing immediate rash, facial/tongue/throat swelling, SOB or lightheadedness with hypotension: unk Has patient had a PCN reaction causing severe rash involving mucus membranes or skin necrosis: unk Has patient had a PCN reaction that required hospitalization: unk Has patient had a PCN reaction occurring within the last 10 years: no If all of the above answers are "NO", then may proceed  with Cephalosporin use.   . Aspirin Nausea And Vomiting  . Azithromycin Other (See Comments)    REACTION: pt states "ZPak doesn't work"  . Caffeine Nausea And Vomiting  . Ciprofloxacin Nausea And Vomiting  . Codeine Nausea And Vomiting  . Oxycodone     Pt stated, "upsets my stomach" (11/19/17)  . Sulfamethizole Other (See Comments)    Unknown reaction  . Tramadol Hcl Other (See Comments)    REACTION: pt states "spaced-out"    Recent Results (from the past 2160 hour(s))  Basic metabolic panel     Status: Abnormal   Collection Time: 09/10/17  1:01 AM  Result Value Ref Range   Sodium 137 135 - 145 mmol/L   Potassium 4.1 3.5 - 5.1 mmol/L   Chloride 102 101 - 111 mmol/L   CO2 24 22 - 32 mmol/L   Glucose, Bld 173 (H) 65 - 99 mg/dL   BUN 8 6 - 20 mg/dL   Creatinine, Ser 0.50 0.44 - 1.00 mg/dL   Calcium 9.6 8.9 - 10.3 mg/dL   GFR calc non Af Amer >60 >60 mL/min   GFR calc Af Amer >60 >60 mL/min    Comment: (NOTE) The eGFR has been calculated using the CKD EPI equation. This calculation has not been validated in all clinical situations. eGFR's persistently <60 mL/min signify possible Chronic Kidney Disease.    Anion gap 11 5 - 15    Comment: Performed at [facility] [address], [place], [place] [address]  CBC     Status: Abnormal   Collection Time: 09/10/17  1:01 AM  Result Value Ref Range   WBC 14.8 (H) 4.0 - 10.5 K/uL   RBC 4.27 3.87 - 5.11 MIL/uL   Hemoglobin 13.1 12.0 - 15.0 g/dL   HCT 40.3 36.0 - 46.0 %   MCV 94.4 78.0 - 100.0 fL   MCH 30.7 26.0 - 34.0 pg   MCHC 32.5 30.0 - 36.0 g/dL   RDW 13.6 11.5 - 15.5 %   Platelets 457 (H) 150 - 400 K/uL    Comment: Performed at [facility], [place]  [address] [place], [place] [address]  I-stat troponin, ED     Status: None   Collection Time: 09/10/17  1:07 AM  Result Value Ref Range   Troponin i, poc 0.01 0.00 - 0.08 ng/mL   Comment 3            Comment: Due to the release kinetics of cTnI, a negative result  within the first hours of the onset of symptoms does not rule out myocardial infarction with certainty. If myocardial infarction is still suspected, repeat the test at appropriate intervals.   Sedimentation rate     Status: None   Collection Time: 09/10/17  9:14 AM  Result Value Ref Range   Sed Rate 20 0 - 22 mm/hr    Comment: Performed at [facility] [address], [place], [place] [address]  C-reactive protein     Status: None   Collection Time: 09/10/17 11:10 AM  Result Value Ref Range   CRP <0.8 <1.0 mg/dL    Comment: Performed at [facility], [place] [address] [place],  [address]    Objective: General: Patient is awake, alert, and oriented x 3 and in no acute distress.  Integument: Skin is warm, dry and supple bilateral. Nails are tender, long, thickened and dystrophic with subungual debris, consistent with onychomycosis, 1-5 bilateral.  No open lesions or preulcerative lesions present bilateral. Remaining integument unremarkable.  Vasculature:  Dorsalis Pedis pulse 1/4 bilateral. Posterior Tibial pulse  1/4 bilateral. Capillary fill time <3 sec 1-5 bilateral. Scant hair growth to the level of the digits.Temperature gradient within normal limits. No varicosities present bilateral. No edema present bilateral.   Neurology: The patient has intact sensation measured with a 5.07/10g Semmes Weinstein Monofilament at all pedal sites bilateral. Vibratory sensation intact bilateral with tuning fork. No Babinski sign present bilateral.   Musculoskeletal:Mild asymptomatic bunion pedal deformities noted bilateral. No pain with palpation bilateral. Muscular strength 5/5 in all lower extremity muscular groups bilateral without pain on range of motion . No tenderness with calf compression bilateral.  Assessment and Plan: Problem List Items Addressed This Visit    None    Visit Diagnoses    Dermatophytosis of nail    -  Primary   Diabetic peripheral vascular disease ([place])        Foot pain, bilateral         -Examined patient. -Discussed and educated patient on diabetic foot care, especially with  regards to the vascular, neurological and musculoskeletal systems.  -Stressed the importance of good glycemic control and the detriment of not controlling glucose levels in relation to the foot. -ABN signed -Mechanically debrided all nails 1-5 bilateral using sterile nail nipper and filed with dremel without incident  -Answered all patient questions -Patient to return in 3 months for at risk foot care -Patient advised to call the office if any problems or questions arise in the meantime.  Landis Martins, DPM

## 2017-11-28 ENCOUNTER — Other Ambulatory Visit: Payer: Self-pay | Admitting: Family Medicine

## 2017-12-09 ENCOUNTER — Ambulatory Visit: Payer: Medicare Other | Admitting: Cardiology

## 2017-12-10 ENCOUNTER — Ambulatory Visit (INDEPENDENT_AMBULATORY_CARE_PROVIDER_SITE_OTHER): Payer: Medicare Other | Admitting: Cardiology

## 2017-12-10 ENCOUNTER — Telehealth: Payer: Self-pay

## 2017-12-10 ENCOUNTER — Encounter: Payer: Self-pay | Admitting: Cardiology

## 2017-12-10 ENCOUNTER — Telehealth: Payer: Self-pay | Admitting: *Deleted

## 2017-12-10 VITALS — BP 128/68 | HR 75 | Ht 60.0 in | Wt 124.8 lb

## 2017-12-10 DIAGNOSIS — I5032 Chronic diastolic (congestive) heart failure: Secondary | ICD-10-CM | POA: Diagnosis not present

## 2017-12-10 DIAGNOSIS — Z01810 Encounter for preprocedural cardiovascular examination: Secondary | ICD-10-CM

## 2017-12-10 DIAGNOSIS — I2510 Atherosclerotic heart disease of native coronary artery without angina pectoris: Secondary | ICD-10-CM

## 2017-12-10 DIAGNOSIS — I2583 Coronary atherosclerosis due to lipid rich plaque: Secondary | ICD-10-CM | POA: Diagnosis not present

## 2017-12-10 NOTE — Progress Notes (Signed)
Cardiology Office Note   Date:  12/10/2017   ID:  [name], DOB 1940-06-21, MRN [id number]  PCP:  [name], MD  Cardiologist:   Candee Furbish, MD       History of Present Illness: [name] is a 77 y.o. female former patient of Dr. Ron Parker who in 2002 had an BMS Express 3.5 x 20 mm stent placed in the proximal LAD with 3 subsequent catheterization showing patent stent here for follow-up.  Physical history of not being able to tolerate aspirin. Stomach pain.  She remains on Plavix long-term.  Left arm focal pain sometimes, right as well. Likely noncardiac. She also discussed occasional right-sided jaw pain at rest. Nonexertional. This seems to be relieved when she stretches surgical back. Lives with son and his wife. Son encourages NTG use.   Has ulcerative colitis. Had trouble at the beach. Back, disk trouble. Discouraged by body aches.  06/04/17 - doing well. UC flair. No CP, no SOB. Likes to drink water. Keeps at beside. No SOB. Weight up.   12/10/2017- she is here for preoperative evaluation prior to colonoscopy.  This is a low risk procedure and usually does not require cardiac risk stratification.  Denies any shortness of breath chest pain but she has been having some more swelling in her left leg she wanted to be seen prior to colonoscopy.  She states that her edema has been fairly chronic and this does happen during the summertime.  She has not had any predisposing factors to DVT.  No pain, no redness.  See below.  Some back pain, ruptured disc. Using walker.   Needs to come off Plavix for epidural as well.  Colonoscopy.   Past Medical History:  Diagnosis Date  . Adenomatous colon polyp 02/2006  . Allergy, unspecified not elsewhere classified   . Anemia   . Anxiety   . Arthritis   . Bronchiectasis   . CAD (coronary artery disease)    BMS to the LAD, 2002; cath 12/07/11 patent LAD stent and mild nonobstructive disease, EF 65%; Medical management  . CHF  (congestive heart failure) (HCC)    Diastolic  . Diabetes mellitus   . Diverticulosis of colon   . DJD (degenerative joint disease)   . Fibromyalgia   . GERD (gastroesophageal reflux disease)   . Hypercholesterolemia   . Hypertension   . Microscopic hematuria   . Neoplasm of kidney   . Obesity   . Other diseases of lung, not elsewhere classified   . Pinched vertebral nerve   . Pulmonary nodule    Negative PET in 2010  . Stress incontinence, female   . Syncope   . Ulcerative colitis, left sided ([place]) 2008   Hx of  . UTI (lower urinary tract infection)   . Vitamin D deficiency disease     Past Surgical History:  Procedure Laterality Date  . ABDOMINAL HYSTERECTOMY    . CARDIAC CATHETERIZATION  11/2011  . CHOLECYSTECTOMY    . COLONOSCOPY W/ BIOPSIES  11-12-12   per Dr. Fuller Plan, diverticulosis only, repeat in 5 yrs  . CORONARY STENT PLACEMENT  2002   BMS to the LAD  . ESOPHAGOGASTRODUODENOSCOPY  12/2008  . HAND SURGERY  01/2006   Right Hand Surgery by Dr. Amedeo Plenty  . LEFT HEART CATHETERIZATION WITH CORONARY ANGIOGRAM N/A 12/07/2011   Procedure: LEFT HEART CATHETERIZATION WITH CORONARY ANGIOGRAM;  Surgeon: Peter M Martinique, MD;  Location: [facility] CATH LAB;  Service: Cardiovascular;  Laterality: N/A;  Current Outpatient Medications  Medication Sig Dispense Refill  . ACCU-CHEK AVIVA PLUS test strip TEST ONCE PER DAY AND DIAGNOSIS CODE IS E 11.9 100 each 1  . ACCU-CHEK FASTCLIX LANCETS MISC Test once per day and diagnosis code is E 11.9 102 each 1  . acetaminophen (TYLENOL) 500 MG tablet Take 500 mg by mouth every 6 (six) hours as needed for headache (pain).     Marland Kitchen albuterol (PROVENTIL) (2.5 MG/3ML) 0.083% nebulizer solution Take 3 mLs (2.5 mg total) by nebulization every 4 (four) hours as needed for wheezing or shortness of breath. 75 mL 3  . albuterol (VENTOLIN HFA) 108 (90 BASE) MCG/ACT inhaler Inhale 2 puffs into the lungs every 4 (four) hours as needed for wheezing or shortness of  breath. 18 each 11  . ALPRAZolam (XANAX) 0.5 MG tablet TAKE 1 TABLET BY MOUTH 3 TIMES A DAY 90 tablet 5  . AMBULATORY NON FORMULARY MEDICATION Medication Name: Budesonide 5 mg Take 2 tablets by mouth daily 60 tablet 2  . amLODipine (NORVASC) 10 MG tablet TAKE 1 TABLET BY MOUTH EVERY DAY 90 tablet 3  . atenolol (TENORMIN) 50 MG tablet Take 1 tablet (50 mg total) by mouth daily. 90 tablet 3  . atorvastatin (LIPITOR) 10 MG tablet TAKE 1 TABLET BY MOUTH DAILY. 90 tablet 3  . azelastine (ASTELIN) 0.1 % nasal spray Place 1 spray into both nostrils at bedtime.    . betamethasone valerate (VALISONE) 0.1 % cream Apply topically 2 (two) times daily. 30 g 2  . budesonide-formoterol (SYMBICORT) 160-4.5 MCG/ACT inhaler Inhale 2 puffs into the lungs 2 (two) times daily.    . Cholecalciferol (VITAMIN D-3) 5000 UNITS TABS Take 5,000 Units by mouth daily.      . clopidogrel (PLAVIX) 75 MG tablet TAKE 1 TABLET BY MOUTH EVERY DAY 90 tablet 3  . clotrimazole (LOTRIMIN) 1 % external solution Apply 1 application topically 2 (two) times daily. In between toes 60 mL 5  . diclofenac sodium (VOLTAREN) 1 % GEL Apply 2 g topically 4 (four) times daily.    Marland Kitchen dicyclomine (BENTYL) 10 MG capsule Take 1 capsule (10 mg total) by mouth 3 (three) times daily before meals. 90 capsule 11  . fluticasone (FLONASE) 50 MCG/ACT nasal spray PLACE 2 SPRAYS IN EACH NOSTRIL AT BEDTIME 16 g 6  . furosemide (LASIX) 40 MG tablet TAKE 2 TABLETS (80 MG) BY MOUTH DAILY 270 tablet 3  . glimepiride (AMARYL) 1 MG tablet TAKE 1 TABLET BY MOUTH DAILY BEFORE BREAKFAST. 90 tablet 3  . glycopyrrolate (ROBINUL) 1 MG tablet Take 1 tablet (1 mg total) by mouth 2 (two) times daily. 60 tablet 11  . HYDROcodone-acetaminophen (NORCO) 10-325 MG tablet Take 1 tablet by mouth every 6 (six) hours as needed for moderate pain. 120 tablet 0  . HYDROcodone-homatropine (HYDROMET) 5-1.5 MG/5ML syrup Take 5 mLs by mouth every 4 (four) hours as needed. 240 mL 0  .  hydrocortisone (PROCTOSOL HC) 2.5 % rectal cream Place rectally 3 (three) times daily. 28.35 g 3  . hydrOXYzine (ATARAX/VISTARIL) 25 MG tablet TAKE 1 TABLET EVERY 4 HOURS AS NEEDED FOR ITCHING 50 tablet 0  . isosorbide mononitrate (IMDUR) 60 MG 24 hr tablet TAKE 1 TABLET BY MOUTH DAILY, PLEASE SCHEDULE NOVEMBER 2018 FOLLOW-UP [phone] 90 tablet 3  . ketoconazole (NIZORAL) 2 % cream Apply 1 application topically daily. 15 g 1  . KLOR-CON M20 20 MEQ tablet TAKE 1 TABLET (20 MEQ TOTAL) BY MOUTH 2 (TWO) TIMES DAILY. [place]  tablet 3  . levocetirizine (XYZAL) 5 MG tablet TAKE 1 TABLET BY MOUTH EVERY DAY IN THE EVENING 90 tablet 0  . LIDODERM 5 % APPLY 1 PATCH TO SKIN FOR 12 HOURS THEN REMOVE AND DISCARD PATCH WITHIN 72 HOURS OR AS DIRECTED (Patient taking differently: APPLY 1 PATCH TO SKIN FOR 12 HOURS THEN REMOVE AND DISCARD PATCH WITHIN 72 HOURS AS NEEDED FOR PAIN) 30 patch 2  . loperamide (IMODIUM) 2 MG capsule Take 4-6 mg by mouth daily.    Marland Kitchen losartan (COZAAR) 50 MG tablet Take 1 tablet (50 mg total) by mouth daily. 90 tablet 3  . magnesium oxide (MAG-OX) 400 MG tablet TAKE 1 TABLET BY MOUTH EVERY DAY 30 tablet 0  . Mesalamine (DELZICOL) 400 MG CPDR DR capsule Take 4 capsules (1,600 mg total) by mouth 3 (three) times daily. 360 capsule 11  . metFORMIN (GLUCOPHAGE) 1000 MG tablet Take 1 tablet (1,000 mg total) by mouth 2 (two) times daily with a meal. 180 tablet 2  . montelukast (SINGULAIR) 10 MG tablet TAKE 1 TABLET BY MOUTH EVERYDAY AT BEDTIME 90 tablet 0  . nabumetone (RELAFEN) 750 MG tablet TAKE 1 TABLET (750 MG) BY MOUTH TWICE A DAY WITH A MEAL 180 tablet 3  . neomycin-polymyxin-hydrocortisone (CORTISPORIN) otic solution Use 1 drop in affected ear three times daily as needed 10 mL 0  . nitroGLYCERIN (NITROSTAT) 0.4 MG SL tablet Place 1 tablet (0.4 mg total) under the tongue every 5 (five) minutes as needed for chest pain. 25 tablet 11  . Olopatadine HCl (PAZEO) 0.7 % SOLN Place 1 drop into both  eyes daily as needed (itching/allergies).    . ondansetron (ZOFRAN) 4 MG tablet Take 1 tablet (4 mg total) by mouth every 6 (six) hours as needed for nausea or vomiting. 40 tablet 0  . pantoprazole (PROTONIX) 40 MG tablet TAKE 1 TABLET BY MOUTH TWICE DAILY 180 tablet 3  . Polyethyl Glycol-Propyl Glycol (SYSTANE OP) Place 1 drop into both eyes daily. For dry eyes    . tiZANidine (ZANAFLEX) 4 MG tablet TAKE 1 TABLET (4 MG TOTAL) BY MOUTH TWICE DAILY 90 tablet 11   Current Facility-Administered Medications  Medication Dose Route Frequency Provider Last Rate Last Dose  . ipratropium-albuterol (DUONEB) 0.5-2.5 (3) MG/3ML nebulizer solution 3 mL  3 mL Nebulization Once Dorothyann Peng, NP        Allergies:   Doxycycline; Amoxicillin; Aspirin; Azithromycin; Caffeine; Ciprofloxacin; Codeine; Oxycodone; Sulfamethizole; and Tramadol hcl    Social History:  The patient  reports that she quit smoking about 44 years ago. Her smoking use included cigarettes. She has a 30.00 pack-year smoking history. She has never used smokeless tobacco. She reports that she drinks alcohol. She reports that she does not use drugs.   Family History:  [name] family history includes Breast cancer in her daughter; Diabetes in her brother and sister; Heart attack in her father; Hypertension in her sister; Parkinsonism in her mother; Pneumonia in her father; Sarcoidosis in her brother.    ROS:  Please see the history of present illness.   All others neg.   PHYSICAL EXAM: VS:  BP 128/68   Pulse 75   Ht 5' (1.524 m)   Wt 124 lb 12.8 oz (56.6 kg)   LMP  (LMP Unknown)   SpO2 94%   BMI 24.37 kg/m  , BMI Body mass index is 24.37 kg/m. GEN: obese Well nourished, well developed, in no acute distress  HEENT: normal  Neck: no  JVD, carotid bruits, or masses Cardiac: RRR; no murmurs, rubs, or gallops, 2+ BLE/pedal edema  Respiratory:  clear to auscultation bilaterally, normal work of breathing GI: soft, nontender,  nondistended, + BS MS: no deformity or atrophy  Skin: warm and dry, no rash Neuro:  Alert and Oriented x 3, Strength and sensation are intact Psych: euthymic mood, full affect    EKG:  EKG today 03/19/16-sinus rhythm, 72, vertical axis, poor R-wave progression personally viewed-no significant change from prior 09/29/15-normal sinus rhythm, 70, biatrial enlargement, no other abnormalities. Personally viewed  Recent Labs: 09/10/2017: BUN 8; Creatinine, Ser 0.50; Hemoglobin 13.1; Platelets 457; Potassium 4.1; Sodium 137    Lipid Panel    Component Value Date/Time   CHOL 136 06/25/2016 1015   TRIG 100.0 06/25/2016 1015   HDL 52.50 06/25/2016 1015   CHOLHDL 3 06/25/2016 1015   VLDL 20.0 06/25/2016 1015   LDLCALC 64 06/25/2016 1015      Wt Readings from Last 3 Encounters:  12/10/17 124 lb 12.8 oz (56.6 kg)  08/13/17 203 lb 6 oz (92.3 kg)  06/04/17 213 lb 12.8 oz (97 kg)      Other studies Reviewed: Additional studies/ records that were reviewed today include: Prior office notes, lab work, catheterization reports. Review of the above records demonstrates: As above   ASSESSMENT AND PLAN:  Preoperative risk stratification prior to epidural injection and colonoscopy - Both of these are low risk procedures.  She may proceed with moderate cardiovascular risk based upon her prior coronary artery disease which has been stable.  She may hold her Plavix for 7 days prior to both procedures and resume postoperatively.  Dr. Lake Bells and Dr. Collene Mares.  Coronary artery disease -2002 proximal LAD bare-metal stent, express stent, Dr. Olevia Perches. -3 subsequent catheterizations reassuring. Last catheterization 2013. -No symptoms currently. Doing well. Secondary prevention.  No angina. -Moderate disease treated medically in other vessels. No changes.   Aspirin intolerance -Stomach issues if taking aspirin -Continue Plavix lifelong. No change. Stable  Morbid Obesity -Encourage weight loss.This is been  trouble at times with her back pain. She is to like walking in the neighborhood but this is hard for her to do now. States not interested in food. Still needs to try to loose weight.  Continue to encourage.  Ulcerative colitis -Dr. Fuller Plan - PPI, meds reviewed. This has been hard on her.   Chronic diastolic heart failure -Occasional diuretic. Fluid restriction, salt restriction. Normal renal function. In part secondary to obesity. Stable.   Left greater than right edema  - edema in lower extremity, fairly chronic.  She states that she has this trouble in the summertime.  She knows to watch her salt, fluids 1.5 L.  She is not having any pain.  No prior surgeries left leg.  Physical exam is unremarkable.  If significant changes were to occur, we would have a low threshold for vascular ultrasound however at this point continue to treat conservatively.  My suspicion for DVT is low.  This is likely dependent edema/venous insufficiency.  Hyperlipidemia -Atorvastatin, LDL less than 70. Continue.  No myalgias.  Essential hypertension -Multidrug regimen reviewed. Good blood pressure control. No change, overall stable.  Disposition:   FU with Mickel Baas in 6 months Wei [place] in 12 months  Signed, Candee Furbish, MD  12/10/2017 9:15 AM    [facility] [address], [place], [place]  [address] Phone: [phone]; Fax: [phone]

## 2017-12-10 NOTE — Telephone Encounter (Signed)
   [facility] Pre-operative Risk Assessment    Request for surgical clearance:  1. What type of surgery is being performed? Colonoscopy  2. When is this surgery scheduled?   01/09/18  3. What type of clearance is required (medical clearance vs. Pharmacy clearance to hold med vs. Both)? Pharmacy  4. Are there any medications that need to be held prior to surgery and how long? Plavix, 5 days  5. Practice name and name of physician performing surgery? [facility]  6. What is your office phone number [phone]   7.   What is your office fax number [phone]  8.   Anesthesia type (None, local, MAC, general) ? MAC   Olin Hauser, RN for Dr Candee Furbish 12/10/2017, 9:26 AM  _________________________________________________________________   (provider comments below)    Preoperative risk stratification prior to epidural injection and colonoscopy - Both of these are low risk procedures.  She may proceed with moderate cardiovascular risk based upon her prior coronary artery disease which has been stable.  She may hold her Plavix for 7 days prior to both procedures and resume postoperatively.  Dr. Lake Bells and Dr. Collene Mares.

## 2017-12-10 NOTE — Telephone Encounter (Signed)
Received clearance from Dr. Marlou Porch to hold Plavix 7 prior to colonoscopy. Left message informing patient. Clearance letter sent to be scanned.

## 2017-12-10 NOTE — Patient Instructions (Addendum)
Medication Instructions:  The current medical regimen is effective;  continue present plan and medications.  Follow-Up: Follow up in 6 months with Cecilie Kicks, NP.  You will receive a letter in the mail 2 months before you are due.  Please call us when you receive this letter to schedule your follow up appointment.  Follow up in 1 year with Dr. Marlou Porch.  You will receive a letter in the mail 2 months before you are due.  Please call us when you receive this letter to schedule your follow up appointment.  If you need a refill on your cardiac medications before your next appointment, please call your pharmacy.  Thank you for choosing [facility]!!    OK to hold Plavix 7 days prior to procedure as requested.

## 2017-12-16 DIAGNOSIS — M5106 Intervertebral disc disorders with myelopathy, lumbar region: Secondary | ICD-10-CM | POA: Diagnosis not present

## 2017-12-26 ENCOUNTER — Other Ambulatory Visit: Payer: Self-pay | Admitting: Family Medicine

## 2018-01-02 ENCOUNTER — Other Ambulatory Visit: Payer: Self-pay

## 2018-01-02 ENCOUNTER — Ambulatory Visit (AMBULATORY_SURGERY_CENTER): Payer: Self-pay | Admitting: *Deleted

## 2018-01-02 VITALS — Ht 60.0 in | Wt 212.3 lb

## 2018-01-02 DIAGNOSIS — K51919 Ulcerative colitis, unspecified with unspecified complications: Secondary | ICD-10-CM

## 2018-01-02 MED ORDER — SUPREP BOWEL PREP KIT 17.5-3.13-1.6 GM/177ML PO SOLN: 1.0000 | Freq: Once | ORAL | 0 refills | Status: AC

## 2018-01-02 NOTE — Progress Notes (Signed)
No egg or soy allergy known to patient  No issues with past sedation with any surgeries  or procedures, no intubation problems  No diet pills per patient No home 02 use per patient  Pt denies issues with constipation  No A fib or A flutter  EMMI video  Offered and declined by the patient.

## 2018-01-02 NOTE — Progress Notes (Signed)
Patient took her Plavix this am prior to [place] not to take any further  Plavix until after procedure.

## 2018-01-07 ENCOUNTER — Other Ambulatory Visit: Payer: Self-pay | Admitting: Family Medicine

## 2018-01-07 NOTE — Telephone Encounter (Signed)
Last OV 02/21/2017   Xanax was last refilled 03/20/2017 disp 90 with 5 refills   Sent to PCP to advise

## 2018-01-07 NOTE — Telephone Encounter (Signed)
Call in Xanax #90 with 5 rf, also Xyzal #90 with 3 rf

## 2018-01-09 ENCOUNTER — Ambulatory Visit (AMBULATORY_SURGERY_CENTER): Payer: Medicare Other | Admitting: Gastroenterology

## 2018-01-09 ENCOUNTER — Encounter: Payer: Self-pay | Admitting: Gastroenterology

## 2018-01-09 VITALS — BP 145/76 | HR 75 | Temp 98.0°F | Resp 21 | Ht 60.0 in | Wt 212.0 lb

## 2018-01-09 DIAGNOSIS — D120 Benign neoplasm of cecum: Secondary | ICD-10-CM | POA: Diagnosis not present

## 2018-01-09 DIAGNOSIS — D124 Benign neoplasm of descending colon: Secondary | ICD-10-CM | POA: Diagnosis not present

## 2018-01-09 DIAGNOSIS — I509 Heart failure, unspecified: Secondary | ICD-10-CM | POA: Diagnosis not present

## 2018-01-09 DIAGNOSIS — D122 Benign neoplasm of ascending colon: Secondary | ICD-10-CM

## 2018-01-09 DIAGNOSIS — J45909 Unspecified asthma, uncomplicated: Secondary | ICD-10-CM | POA: Diagnosis not present

## 2018-01-09 DIAGNOSIS — K51919 Ulcerative colitis, unspecified with unspecified complications: Secondary | ICD-10-CM

## 2018-01-09 DIAGNOSIS — K5190 Ulcerative colitis, unspecified, without complications: Secondary | ICD-10-CM | POA: Diagnosis not present

## 2018-01-09 HISTORY — PX: COLONOSCOPY: SHX174

## 2018-01-09 MED ORDER — SODIUM CHLORIDE 0.9 % IV SOLN: 500.0000 mL | Freq: Once | INTRAVENOUS | Status: DC

## 2018-01-09 NOTE — Patient Instructions (Signed)
Handouts given on diverticulosis and polyps. Resume Plavix tomorrow at prior dose.   YOU HAD AN ENDOSCOPIC PROCEDURE TODAY AT [facility]:   Refer to the procedure report that was given to you for any specific questions about what was found during the examination.  If the procedure report does not answer your questions, please call your gastroenterologist to clarify.  If you requested that your care partner not be given the details of your procedure findings, then the procedure report has been included in a sealed envelope for you to review at your convenience later.  YOU SHOULD EXPECT: Some feelings of bloating in the abdomen. Passage of more gas than usual.  Walking can help get rid of the air that was put into your GI tract during the procedure and reduce the bloating. If you had a lower endoscopy (such as a colonoscopy or flexible sigmoidoscopy) you may notice spotting of blood in your stool or on the toilet paper. If you underwent a bowel prep for your procedure, you may not have a normal bowel movement for a few days.  Please Note:  You might notice some irritation and congestion in your nose or some drainage.  This is from the oxygen used during your procedure.  There is no need for concern and it should clear up in a day or so.  SYMPTOMS TO REPORT IMMEDIATELY:   Following lower endoscopy (colonoscopy or flexible sigmoidoscopy):  Excessive amounts of blood in the stool  Significant tenderness or worsening of abdominal pains  Swelling of the abdomen that is new, acute  Fever of 100F or higher   For urgent or emergent issues, a gastroenterologist can be reached at any hour by calling [phone].   DIET:  We do recommend a small meal at first, but then you may proceed to your regular diet.  Drink plenty of fluids but you should avoid alcoholic beverages for 24 hours.  ACTIVITY:  You should plan to take it easy for the rest of today and you should NOT DRIVE or use  heavy machinery until tomorrow (because of the sedation medicines used during the test).    FOLLOW UP: Our staff will call the number listed on your records the next business day following your procedure to check on you and address any questions or concerns that you may have regarding the information given to you following your procedure. If we do not reach you, we will leave a message.  However, if you are feeling well and you are not experiencing any problems, there is no need to return our call.  We will assume that you have returned to your regular daily activities without incident.  If any biopsies were taken you will be contacted by phone or by letter within the next 1-3 weeks.  Please call us at [phone] if you have not heard about the biopsies in 3 weeks.    SIGNATURES/CONFIDENTIALITY: You and/or your care partner have signed paperwork which will be entered into your electronic medical record.  These signatures attest to the fact that that the information above on your After Visit Summary has been reviewed and is understood.  Full responsibility of the confidentiality of this discharge information lies with you and/or your care-partner.

## 2018-01-09 NOTE — Progress Notes (Signed)
Called to room to assist during endoscopic procedure.  Patient ID and intended procedure confirmed with present staff. Received instructions for my participation in the procedure from the performing physician.

## 2018-01-09 NOTE — Progress Notes (Signed)
Report given to PACU, vss 

## 2018-01-09 NOTE — Op Note (Addendum)
[name] Procedure Date: 01/09/2018 9:22 AM MRN: [id number] Endoscopist: Ladene Artist , MD Age: 77 Referring MD:  Date of Birth: 1940/06/20 Gender: Female Account #: [id number] Procedure:                Colonoscopy Indications:              High risk colon cancer surveillance: Ulcerative                            left sided colitis Medicines:                Monitored Anesthesia Care Procedure:                Pre-Anesthesia Assessment:                           - Prior to the procedure, a History and Physical                            was performed, and patient medications and                            allergies were reviewed. [name] tolerance of                            previous anesthesia was also reviewed. The risks                            and benefits of the procedure and the sedation                            options and risks were discussed with the patient.                            All questions were answered, and informed consent                            was obtained. Prior Anticoagulants: The patient has                            taken Plavix (clopidogrel). ASA Grade Assessment:                            III - A patient with severe systemic disease. After                            reviewing the risks and benefits, the patient was                            deemed in satisfactory condition to undergo the                            procedure.  After obtaining informed consent, the colonoscope                            was passed under direct vision. Throughout the                            procedure, the patient's blood pressure, pulse, and                            oxygen saturations were monitored continuously. The                            Model PCF-[id number]) scope was introduced                            through the anus and advanced to the the cecum,            identified by appendiceal orifice and ileocecal                            valve. The ileocecal valve, appendiceal orifice,                            and rectum were photographed. The quality of the                            bowel preparation was adequate. The colonoscopy was                            performed without difficulty. The patient tolerated                            the procedure well. Scope In: 10:04:59 AM Scope Out: 10:23:49 AM Scope Withdrawal Time: 0 hours 15 minutes 28 seconds  Total Procedure Duration: 0 hours 18 minutes 50 seconds  Findings:                 The perianal and digital rectal examinations were                            normal.                           Three sessile polyps were found in the descending                            colon, ascending colon and ileocecal valve. The                            polyps were 6 to 8 mm in size. These polyps were                            removed with a cold snare. Resection and retrieval  were complete.                           There were two medium-sized lipomas, 15 mm in                            diameter, in the transverse colon and in the                            ascending colon.                           Multiple small-mouthed diverticula were found in                            the left colon.                           The exam was otherwise without abnormality on                            direct and retroflexion views. Random biopsies                            obtained. Complications:            No immediate complications. Estimated blood loss:                            None. Estimated Blood Loss:     Estimated blood loss: none. Impression:               - Three 6 to 8 mm polyps in the descending colon,                            in the ascending colon and at the ileocecal valve,                            removed with a cold snare. Resected and retrieved.                            - Medium-sized lipoma in the transverse colon and                            in the ascending colon.                           - Diverticulosis in the left colon.                           - The examination was otherwise normal on direct                            and retroflexion views. Recommendation:           - Repeat colonoscopy in 3 - 5 years for  surveillance pending pathology review.                           - Patient has a contact number available for                            emergencies. The signs and symptoms of potential                            delayed complications were discussed with the                            patient. Return to normal activities tomorrow.                            Written discharge instructions were provided to the                            patient.                           - Resume previous diet.                           - Continue present medications.                           - Await pathology results.                           - Resume Plavix (clopidogrel) at prior dose                            tomorrow. Refer to managing physician for further                            adjustment of therapy. Ladene Artist, MD 01/09/2018 10:30:29 AM This report has been signed electronically.

## 2018-01-09 NOTE — Progress Notes (Signed)
Pt's states no medical or surgical changes since previsit or office visit. 

## 2018-01-10 ENCOUNTER — Ambulatory Visit (INDEPENDENT_AMBULATORY_CARE_PROVIDER_SITE_OTHER): Payer: Medicare Other | Admitting: Adult Health

## 2018-01-10 ENCOUNTER — Telehealth: Payer: Self-pay | Admitting: *Deleted

## 2018-01-10 ENCOUNTER — Encounter: Payer: Self-pay | Admitting: Adult Health

## 2018-01-10 ENCOUNTER — Telehealth: Payer: Self-pay

## 2018-01-10 VITALS — BP 120/74 | HR 73 | Temp 98.2°F | Wt 218.0 lb

## 2018-01-10 DIAGNOSIS — J45901 Unspecified asthma with (acute) exacerbation: Secondary | ICD-10-CM

## 2018-01-10 DIAGNOSIS — I2583 Coronary atherosclerosis due to lipid rich plaque: Secondary | ICD-10-CM | POA: Diagnosis not present

## 2018-01-10 DIAGNOSIS — I2510 Atherosclerotic heart disease of native coronary artery without angina pectoris: Secondary | ICD-10-CM

## 2018-01-10 MED ORDER — METHYLPREDNISOLONE 4 MG PO TBPK: ORAL_TABLET | ORAL | 0 refills | Status: DC

## 2018-01-10 MED ORDER — CLARITHROMYCIN 250 MG PO TABS: 250.0000 mg | ORAL_TABLET | Freq: Two times a day (BID) | ORAL | 0 refills | Status: DC

## 2018-01-10 NOTE — Telephone Encounter (Signed)
  Follow up Call-  Call Caley Volkert number 01/09/2018  Post procedure Call Rossy Virag phone  # [phone]  Permission to leave phone message Yes  Some recent data might be hidden     Patient questions:  Do you have a fever, pain , or abdominal swelling? No. Pain Score  0 *  Have you tolerated food without any problems? Yes.    Have you been able to return to your normal activities? Yes.    Do you have any questions about your discharge instructions: Diet   No. Medications  No. Follow up visit  No.  Do you have questions or concerns about your Care? No.  Actions: * If pain score is 4 or above: No action needed, pain <4.

## 2018-01-10 NOTE — Progress Notes (Signed)
Subjective:    Patient ID: [name], female    DOB: 19-Feb-1941, 77 y.o.   MRN: [id number]  HPI 77 year old female who  has a past medical history of Adenomatous colon polyp (02/2006), Allergy, Allergy, unspecified not elsewhere classified, Anemia, Anxiety, Arthritis, Asthma, Blood transfusion without reported diagnosis, Bronchiectasis, CAD (coronary artery disease), Cataract, CHF (congestive heart failure) ([place]), Diabetes mellitus, Diverticulosis of colon, DJD (degenerative joint disease), Fibromyalgia, GERD (gastroesophageal reflux disease), Hypercholesterolemia, Hypertension, Microscopic hematuria, Neoplasm of kidney, Obesity, Other diseases of lung, not elsewhere classified, Pinched vertebral nerve, Pulmonary nodule, Stress incontinence, female, Syncope, Ulcerative colitis, left sided ([place]) (2008), UTI (lower urinary tract infection), and Vitamin D deficiency disease.  She presents to the office today for an acute issue of acute asthma exacerbation that started one day ago. She has been using her inhalers and nebulizer at home without relief. Symptoms include that of shortness of breath, chest congestion, wheezing, fatigue. Her last nebulizer treatment was at 7 am today, this did nothing to help her.    Review of Systems See HPI   Past Medical History:  Diagnosis Date  . Adenomatous colon polyp 02/2006  . Allergy   . Allergy, unspecified not elsewhere classified   . Anemia   . Anxiety   . Arthritis   . Asthma   . Blood transfusion without reported diagnosis   . Bronchiectasis   . CAD (coronary artery disease)    BMS to the LAD, 2002; cath 12/07/11 patent LAD stent and mild nonobstructive disease, EF 65%; Medical management  . Cataract   . CHF (congestive heart failure) ([facility])    Diastolic  . Diabetes mellitus   . Diverticulosis of colon   . DJD (degenerative joint disease)   . Fibromyalgia   . GERD (gastroesophageal reflux disease)   . Hypercholesterolemia   . Hypertension     . Microscopic hematuria   . Neoplasm of kidney   . Obesity   . Other diseases of lung, not elsewhere classified   . Pinched vertebral nerve   . Pulmonary nodule    Negative PET in 2010  . Stress incontinence, female   . Syncope   . Ulcerative colitis, left sided ([place]) 2008   Hx of  . UTI (lower urinary tract infection)   . Vitamin D deficiency disease     Social History   Socioeconomic History  . Marital status: Divorced    Spouse name: Not on file  . Number of children: 3  . Years of education: Not on file  . Highest education level: Not on file  Occupational History  . Occupation: Retired    Fish farm manager: RETIRED  Social Needs  . Financial resource strain: Not on file  . Food insecurity:    Worry: Not on file    Inability: Not on file  . Transportation needs:    Medical: Not on file    Non-medical: Not on file  Tobacco Use  . Smoking status: Former Smoker    Packs/day: 2.00    Years: 15.00    Pack years: 30.00    Types: Cigarettes    Last attempt to quit: 04/30/1973    Years since quitting: 44.7  . Smokeless tobacco: Never Used  . Tobacco comment: ex-smoker: smoked for 10-15 years up to 2ppd, quit 1975.  Substance and Sexual Activity  . Alcohol use: Yes    Alcohol/week: 0.0 standard drinks    Comment: occ  . Drug use: No  . Sexual activity: Never  Lifestyle  . Physical activity:    Days per week: Not on file    Minutes per session: Not on file  . Stress: Not on file  Relationships  . Social connections:    Talks on phone: Not on file    Gets together: Not on file    Attends religious service: Not on file    Active member of club or organization: Not on file    Attends meetings of clubs or organizations: Not on file    Relationship status: Not on file  . Intimate partner violence:    Fear of current or ex partner: Not on file    Emotionally abused: Not on file    Physically abused: Not on file    Forced sexual activity: Not on file  Other Topics  Concern  . Not on file  Social History Narrative  . Not on file    Past Surgical History:  Procedure Laterality Date  . ABDOMINAL HYSTERECTOMY    . CARDIAC CATHETERIZATION  11/2011  . CHOLECYSTECTOMY    . COLONOSCOPY    . COLONOSCOPY W/ BIOPSIES  11-12-12   per Dr. Fuller Plan, diverticulosis only, repeat in 5 yrs  . CORONARY STENT PLACEMENT  2002   BMS to the LAD  . ESOPHAGOGASTRODUODENOSCOPY  12/2008  . HAND SURGERY  01/2006   Right Hand Surgery by Dr. Amedeo Plenty  . LEFT HEART CATHETERIZATION WITH CORONARY ANGIOGRAM N/A 12/07/2011   Procedure: LEFT HEART CATHETERIZATION WITH CORONARY ANGIOGRAM;  Surgeon: Peter M Martinique, MD;  Location: [facility] CATH LAB;  Service: Cardiovascular;  Laterality: N/A;    Family History  Problem Relation Age of Onset  . Pneumonia Father   . Heart attack Father   . Colon polyps Father   . Parkinsonism Mother   . Diabetes Brother   . Sarcoidosis Brother   . Hypertension Sister   . Diabetes Sister   . Breast cancer Daughter   . Colon cancer Neg Hx   . Esophageal cancer Neg Hx   . Rectal cancer Neg Hx   . Stomach cancer Neg Hx     Allergies  Allergen Reactions  . Doxycycline Other (See Comments)    Unsteady gait  . Amoxicillin Other (See Comments)    Unknown reaction  Has patient had a PCN reaction causing immediate rash, facial/tongue/throat swelling, SOB or lightheadedness with hypotension: unk Has patient had a PCN reaction causing severe rash involving mucus membranes or skin necrosis: unk Has patient had a PCN reaction that required hospitalization: unk Has patient had a PCN reaction occurring within the last 10 years: no If all of the above answers are "NO", then may proceed with Cephalosporin use.   . Aspirin Nausea And Vomiting  . Azithromycin Other (See Comments)    REACTION: pt states "ZPak doesn't work"  . Caffeine Nausea And Vomiting  . Ciprofloxacin Nausea And Vomiting  . Codeine Nausea And Vomiting  . Oxycodone     Pt stated, "upsets my  stomach" (11/19/17)  . Sulfamethizole Other (See Comments)    Unknown reaction  . Tramadol Hcl Other (See Comments)    REACTION: pt states "spaced-out"    Current Outpatient Medications on File Prior to Visit  Medication Sig Dispense Refill  . ACCU-CHEK AVIVA PLUS test strip TEST ONCE PER DAY AND DIAGNOSIS CODE IS E 11.9 100 each 1  . ACCU-CHEK FASTCLIX LANCETS MISC Test once per day and diagnosis code is E 11.9 102 each 1  . acetaminophen (TYLENOL) 500 MG tablet  Take 500 mg by mouth every 6 (six) hours as needed for headache (pain).     Marland Kitchen albuterol (PROVENTIL) (2.5 MG/3ML) 0.083% nebulizer solution Take 3 mLs (2.5 mg total) by nebulization every 4 (four) hours as needed for wheezing or shortness of breath. 75 mL 3  . albuterol (VENTOLIN HFA) 108 (90 BASE) MCG/ACT inhaler Inhale 2 puffs into the lungs every 4 (four) hours as needed for wheezing or shortness of breath. 18 each 11  . ALPRAZolam (XANAX) 0.5 MG tablet TAKE 1 TABLET BY MOUTH 3 TIMES A DAY AS NEEDED 90 tablet 5  . AMBULATORY NON FORMULARY MEDICATION Medication Name: Budesonide 5 mg Take 2 tablets by mouth daily 60 tablet 2  . amLODipine (NORVASC) 10 MG tablet TAKE 1 TABLET BY MOUTH EVERY DAY 90 tablet 3  . atenolol (TENORMIN) 50 MG tablet Take 1 tablet (50 mg total) by mouth daily. 90 tablet 3  . atorvastatin (LIPITOR) 10 MG tablet TAKE 1 TABLET BY MOUTH DAILY. 90 tablet 3  . azelastine (ASTELIN) 0.1 % nasal spray Place 1 spray into both nostrils at bedtime.    . betamethasone valerate (VALISONE) 0.1 % cream Apply topically 2 (two) times daily. 30 g 2  . budesonide-formoterol (SYMBICORT) 160-4.5 MCG/ACT inhaler Inhale 2 puffs into the lungs 2 (two) times daily.    . Cholecalciferol (VITAMIN D-3) 5000 UNITS TABS Take 5,000 Units by mouth daily.      . clopidogrel (PLAVIX) 75 MG tablet TAKE 1 TABLET BY MOUTH EVERY DAY 90 tablet 3  . clotrimazole (LOTRIMIN) 1 % external solution Apply 1 application topically 2 (two) times daily. In  between toes 60 mL 5  . diclofenac sodium (VOLTAREN) 1 % GEL Apply 2 g topically 4 (four) times daily.    Marland Kitchen dicyclomine (BENTYL) 10 MG capsule Take 1 capsule (10 mg total) by mouth 3 (three) times daily before meals. 90 capsule 11  . fluticasone (FLONASE) 50 MCG/ACT nasal spray PLACE 2 SPRAYS IN EACH NOSTRIL AT BEDTIME 16 g 6  . furosemide (LASIX) 40 MG tablet TAKE 2 TABLETS (80 MG) BY MOUTH DAILY 270 tablet 3  . glimepiride (AMARYL) 1 MG tablet TAKE 1 TABLET BY MOUTH DAILY BEFORE BREAKFAST. 90 tablet 3  . glycopyrrolate (ROBINUL) 1 MG tablet Take 1 tablet (1 mg total) by mouth 2 (two) times daily. 60 tablet 11  . HYDROcodone-acetaminophen (NORCO) 10-325 MG tablet Take 1 tablet by mouth every 6 (six) hours as needed for moderate pain. 120 tablet 0  . HYDROcodone-homatropine (HYDROMET) 5-1.5 MG/5ML syrup Take 5 mLs by mouth every 4 (four) hours as needed. 240 mL 0  . hydrOXYzine (ATARAX/VISTARIL) 25 MG tablet TAKE 1 TABLET EVERY 4 HOURS AS NEEDED FOR ITCHING 50 tablet 0  . isosorbide mononitrate (IMDUR) 60 MG 24 hr tablet TAKE 1 TABLET BY MOUTH DAILY, PLEASE SCHEDULE NOVEMBER 2018 FOLLOW-UP [phone] 90 tablet 3  . ketoconazole (NIZORAL) 2 % cream Apply 1 application topically daily. 15 g 1  . KLOR-CON M20 20 MEQ tablet TAKE 1 TABLET (20 MEQ TOTAL) BY MOUTH 2 (TWO) TIMES DAILY. 180 tablet 3  . levocetirizine (XYZAL) 5 MG tablet TAKE 1 TABLET BY MOUTH EVERY DAY IN THE EVENING 90 tablet 3  . LIDODERM 5 % APPLY 1 PATCH TO SKIN FOR 12 HOURS THEN REMOVE AND DISCARD PATCH WITHIN 72 HOURS OR AS DIRECTED (Patient taking differently: APPLY 1 PATCH TO SKIN FOR 12 HOURS THEN REMOVE AND DISCARD PATCH WITHIN 72 HOURS AS NEEDED FOR PAIN) 30 patch  2  . loperamide (IMODIUM) 2 MG capsule Take 4-6 mg by mouth daily.    Marland Kitchen losartan (COZAAR) 50 MG tablet Take 1 tablet (50 mg total) by mouth daily. 90 tablet 3  . magnesium oxide (MAG-OX) 400 MG tablet TAKE 1 TABLET BY MOUTH EVERY DAY 30 tablet 0  . Mesalamine  (DELZICOL) 400 MG CPDR DR capsule Take 4 capsules (1,600 mg total) by mouth 3 (three) times daily. 360 capsule 11  . metFORMIN (GLUCOPHAGE) 1000 MG tablet Take 1 tablet (1,000 mg total) by mouth 2 (two) times daily with a meal. 180 tablet 2  . montelukast (SINGULAIR) 10 MG tablet TAKE 1 TABLET BY MOUTH EVERYDAY AT BEDTIME 90 tablet 0  . nabumetone (RELAFEN) 750 MG tablet TAKE 1 TABLET (750 MG) BY MOUTH TWICE A DAY WITH A MEAL 180 tablet 3  . neomycin-polymyxin-hydrocortisone (CORTISPORIN) otic solution Use 1 drop in affected ear three times daily as needed 10 mL 0  . nitroGLYCERIN (NITROSTAT) 0.4 MG SL tablet Place 1 tablet (0.4 mg total) under the tongue every 5 (five) minutes as needed for chest pain. 25 tablet 11  . Olopatadine HCl (PAZEO) 0.7 % SOLN Place 1 drop into both eyes daily as needed (itching/allergies).    . ondansetron (ZOFRAN) 4 MG tablet Take 1 tablet (4 mg total) by mouth every 6 (six) hours as needed for nausea or vomiting. 40 tablet 0  . pantoprazole (PROTONIX) 40 MG tablet TAKE 1 TABLET BY MOUTH TWICE DAILY 180 tablet 3  . Polyethyl Glycol-Propyl Glycol (SYSTANE OP) Place 1 drop into both eyes daily. For dry eyes    . tiZANidine (ZANAFLEX) 4 MG tablet TAKE 1 TABLET (4 MG TOTAL) BY MOUTH TWICE DAILY 90 tablet 11   Current Facility-Administered Medications on File Prior to Visit  Medication Dose Route Frequency Provider Last Rate Last Dose  . ipratropium-albuterol (DUONEB) 0.5-2.5 (3) MG/3ML nebulizer solution 3 mL  3 mL Nebulization Once Jacci Ruberg, NP        BP 120/74   Pulse 73   Temp 98.2 F (36.8 C)   Wt 218 lb (98.9 kg)   LMP  (LMP Unknown)   SpO2 98%   BMI 42.58 kg/m       Objective:   Physical Exam  Constitutional: She appears well-developed and well-nourished. No distress.  Cardiovascular: Normal rate, regular rhythm, normal heart sounds and intact distal pulses.  Pulmonary/Chest: Effort normal. No stridor. She has decreased breath sounds. She has  wheezes. She has no rhonchi. She has no rales.  Skin: Skin is warm and dry. She is not diaphoretic.  Psychiatric: She has a normal mood and affect. Her behavior is normal. Judgment and thought content normal.  Nursing note and vitals reviewed.     Assessment & Plan:  1. Moderate asthma with exacerbation, unspecified whether persistent - methylPREDNISolone (MEDROL DOSEPAK) 4 MG TBPK tablet; Take as directed  Dispense: 21 tablet; Refill: 0 - clarithromycin (BIAXIN) 250 MG tablet; Take 1 tablet (250 mg total) by mouth 2 (two) times daily.  Dispense: 14 tablet; Refill: 0 - Follow up if no improvement   Dorothyann Peng, NP

## 2018-01-10 NOTE — Telephone Encounter (Signed)
Left message on f/u call 

## 2018-01-16 DIAGNOSIS — J019 Acute sinusitis, unspecified: Secondary | ICD-10-CM | POA: Diagnosis not present

## 2018-01-16 DIAGNOSIS — K219 Gastro-esophageal reflux disease without esophagitis: Secondary | ICD-10-CM | POA: Diagnosis not present

## 2018-01-16 DIAGNOSIS — J4541 Moderate persistent asthma with (acute) exacerbation: Secondary | ICD-10-CM | POA: Diagnosis not present

## 2018-01-16 DIAGNOSIS — J309 Allergic rhinitis, unspecified: Secondary | ICD-10-CM | POA: Diagnosis not present

## 2018-01-25 ENCOUNTER — Other Ambulatory Visit: Payer: Self-pay | Admitting: Family Medicine

## 2018-01-27 ENCOUNTER — Encounter: Payer: Self-pay | Admitting: Gastroenterology

## 2018-01-29 ENCOUNTER — Other Ambulatory Visit: Payer: Self-pay | Admitting: Family Medicine

## 2018-02-06 ENCOUNTER — Encounter: Payer: Self-pay | Admitting: Family Medicine

## 2018-02-06 ENCOUNTER — Ambulatory Visit (INDEPENDENT_AMBULATORY_CARE_PROVIDER_SITE_OTHER): Payer: Medicare Other | Admitting: Family Medicine

## 2018-02-06 VITALS — BP 128/84 | HR 76 | Temp 97.9°F | Wt 215.0 lb

## 2018-02-06 DIAGNOSIS — I428 Other cardiomyopathies: Secondary | ICD-10-CM | POA: Diagnosis not present

## 2018-02-06 DIAGNOSIS — R519 Headache, unspecified: Secondary | ICD-10-CM

## 2018-02-06 DIAGNOSIS — I10 Essential (primary) hypertension: Secondary | ICD-10-CM | POA: Diagnosis not present

## 2018-02-06 DIAGNOSIS — E119 Type 2 diabetes mellitus without complications: Secondary | ICD-10-CM | POA: Diagnosis not present

## 2018-02-06 DIAGNOSIS — R51 Headache: Secondary | ICD-10-CM

## 2018-02-06 DIAGNOSIS — I2510 Atherosclerotic heart disease of native coronary artery without angina pectoris: Secondary | ICD-10-CM

## 2018-02-06 DIAGNOSIS — I5032 Chronic diastolic (congestive) heart failure: Secondary | ICD-10-CM

## 2018-02-06 DIAGNOSIS — I2583 Coronary atherosclerosis due to lipid rich plaque: Secondary | ICD-10-CM

## 2018-02-06 DIAGNOSIS — R600 Localized edema: Secondary | ICD-10-CM

## 2018-02-06 LAB — LIPID PANEL
CHOLESTEROL: 152 mg/dL (ref 0–200)
HDL: 58.4 mg/dL (ref >39.00)
LDL CALC: 70 mg/dL (ref 0–99)
NonHDL: 93.42
Total CHOL/HDL Ratio: 3
Triglycerides: 117 mg/dL (ref 0.0–149.0)
VLDL: 23.4 mg/dL (ref 0.0–40.0)

## 2018-02-06 LAB — CBC WITH DIFFERENTIAL/PLATELET
BASOS ABS: 0 10*3/uL (ref 0.0–0.1)
BASOS PCT: 0.4 % (ref 0.0–3.0)
EOS ABS: 0.4 10*3/uL (ref 0.0–0.7)
Eosinophils Relative: 3.7 % (ref 0.0–5.0)
HCT: 38.2 % (ref 36.0–46.0)
Hemoglobin: 12.2 g/dL (ref 12.0–15.0)
LYMPHS ABS: 1.8 10*3/uL (ref 0.7–4.0)
LYMPHS PCT: 16.2 % (ref 12.0–46.0)
MCHC: 31.9 g/dL (ref 30.0–36.0)
MCV: 91.9 fl (ref 78.0–100.0)
MONO ABS: 0.7 10*3/uL (ref 0.1–1.0)
Monocytes Relative: 6.6 % (ref 3.0–12.0)
NEUTROS ABS: 8.1 10*3/uL — AB (ref 1.4–7.7)
NEUTROS PCT: 73.1 % (ref 43.0–77.0)
PLATELETS: 435 10*3/uL — AB (ref 150.0–400.0)
RBC: 4.16 Mil/uL (ref 3.87–5.11)
RDW: 14.3 % (ref 11.5–15.5)
WBC: 11.1 10*3/uL — ABNORMAL HIGH (ref 4.0–10.5)

## 2018-02-06 LAB — HEMOGLOBIN A1C: Hgb A1c MFr Bld: 7.9 % — ABNORMAL HIGH (ref 4.6–6.5)

## 2018-02-06 LAB — BASIC METABOLIC PANEL
BUN: 11 mg/dL (ref 6–23)
CALCIUM: 9.3 mg/dL (ref 8.4–10.5)
CO2: 30 meq/L (ref 19–32)
CREATININE: 0.48 mg/dL (ref 0.40–1.20)
Chloride: 101 mEq/L (ref 96–112)
GFR: 161.91 mL/min (ref >60.00)
GLUCOSE: 152 mg/dL — AB (ref 70–99)
Potassium: 4 mEq/L (ref 3.5–5.1)
SODIUM: 141 meq/L (ref 135–145)

## 2018-02-06 LAB — HEPATIC FUNCTION PANEL
ALT: 7 U/L (ref 0–35)
AST: 12 U/L (ref 0–37)
Albumin: 4.5 g/dL (ref 3.5–5.2)
Alkaline Phosphatase: 55 U/L (ref 39–117)
BILIRUBIN DIRECT: 0.1 mg/dL (ref 0.0–0.3)
BILIRUBIN TOTAL: 0.5 mg/dL (ref 0.2–1.2)
Total Protein: 7.3 g/dL (ref 6.0–8.3)

## 2018-02-06 LAB — TSH: TSH: 0.77 u[IU]/mL (ref 0.35–4.50)

## 2018-02-06 NOTE — Progress Notes (Signed)
   Subjective:    Patient ID: [name], female    DOB: 09-Nov-1940, 77 y.o.   MRN: [id number]  HPI Here for several issues. First she was having some mild intermittent headaches for several weeks until she figured out they were from the tight head scarves she was wearing at night. She stopped wearing these and she starting wearing a loose fitting cap, and now the headaches have resolved. Also she asks about swelling in the legs, which started about a year ago. The left leg is always bigger than the right. No SOB. Also she asks about getting some labs done. She is fasting this morning. She has not had a lipid panel or an A1c done in over a year. Her BP is stable.    Review of Systems  Constitutional: Negative.   HENT: Negative.   Eyes: Negative.   Respiratory: Negative.   Cardiovascular: Positive for leg swelling. Negative for chest pain and palpitations.  Neurological: Positive for headaches. Negative for dizziness and light-headedness.       Objective:   Physical Exam  Constitutional: She is oriented to person, place, and time. She appears well-developed and well-nourished.  Walks with a rolling walker   Neck: No thyromegaly present.  Cardiovascular: Normal rate, regular rhythm, normal heart sounds and intact distal pulses.  Pulmonary/Chest: Effort normal and breath sounds normal. No stridor. No respiratory distress. She has no wheezes. She has no rales.  Musculoskeletal:  2+ edema in the right lower leg and ankle. 4+ edema in the left lower leg and ankle   Lymphadenopathy:    She has no cervical adenopathy.  Neurological: She is alert and oriented to person, place, and time.          Assessment & Plan:  Her headaches have resolved and these were apparently the result of wearing tight head scarves. Her BP is stable. Her CHF is well controlled. She has venous insufficiency in the legs. I explained that diuretics do not help with this. She needs to elevate the legs when  possible and she will wear some knee high compression stockings. Get fasting labs today.  Alysia Penna, MD

## 2018-02-10 ENCOUNTER — Telehealth: Payer: Self-pay | Admitting: *Deleted

## 2018-02-10 NOTE — Telephone Encounter (Signed)
Called and spoke with pt and she is aware of her lab results.  Pt stated that Dr. Sarajane Jews was going to send in glimepiride and another medication for her.  She stated that she has been to the pharmacy x 2 and these have not been sent in.  Dr. Sarajane Jews please advise. Thanks

## 2018-02-11 MED ORDER — MAGNESIUM OXIDE 400 MG PO TABS: 1.0000 | ORAL_TABLET | Freq: Every day | ORAL | 3 refills | Status: DC

## 2018-02-11 MED ORDER — GLIMEPIRIDE 1 MG PO TABS: ORAL_TABLET | ORAL | 3 refills | Status: DC

## 2018-02-11 NOTE — Telephone Encounter (Signed)
Called and spoke with pt and she is aware of refills that have been sent to the pharmacy.

## 2018-02-11 NOTE — Telephone Encounter (Signed)
Glimiperide and magnesium refills were sent in

## 2018-02-18 ENCOUNTER — Ambulatory Visit (INDEPENDENT_AMBULATORY_CARE_PROVIDER_SITE_OTHER): Payer: Medicare Other | Admitting: Sports Medicine

## 2018-02-18 ENCOUNTER — Other Ambulatory Visit: Payer: Self-pay | Admitting: Sports Medicine

## 2018-02-18 ENCOUNTER — Encounter: Payer: Self-pay | Admitting: Sports Medicine

## 2018-02-18 DIAGNOSIS — M79672 Pain in left foot: Secondary | ICD-10-CM | POA: Diagnosis not present

## 2018-02-18 DIAGNOSIS — L309 Dermatitis, unspecified: Secondary | ICD-10-CM

## 2018-02-18 DIAGNOSIS — E1151 Type 2 diabetes mellitus with diabetic peripheral angiopathy without gangrene: Secondary | ICD-10-CM

## 2018-02-18 DIAGNOSIS — B351 Tinea unguium: Secondary | ICD-10-CM | POA: Diagnosis not present

## 2018-02-18 DIAGNOSIS — M79671 Pain in right foot: Secondary | ICD-10-CM

## 2018-02-18 DIAGNOSIS — E119 Type 2 diabetes mellitus without complications: Secondary | ICD-10-CM | POA: Diagnosis not present

## 2018-02-18 MED ORDER — CLOBETASOL PROPIONATE 0.05 % EX CREA: 1.0000 "application " | TOPICAL_CREAM | Freq: Two times a day (BID) | CUTANEOUS | 0 refills | Status: DC

## 2018-02-18 NOTE — Progress Notes (Signed)
Patient ID: [name], female   DOB: August 26, 1940, 77 y.o.   MRN: [id number]  Subjective: [name] is a 77 y.o. female patient with history of diabetes who returns to office today complaining of long, painful nails  while ambulating in shoes; unable to trim.  Patient states that the glucose reading this morning was not recorded. Reports last a1c 7 and that she is still dealing with her back. No other issues.   Patient Active Problem List   Diagnosis Date Noted  . Fibromyalgia 01/03/2017  . Left sided colitis ([place]) 05/13/2015  . Diarrhea 08/04/2013  . Back pain, chronic 02/06/2012  . Hyperlipidemia 12/07/2011  . Hematuria 11/27/2011  . Ulcerative colitis ([place]) 11/25/2011  . Chronic diastolic CHF (congestive heart failure) ([place]) 11/25/2011  . Lower extremity edema 11/25/2011  . Nonischemic cardiomyopathy ([place]) 04/17/2011  . CAD (coronary artery disease)   . Bronchiectasis ([place])   . Essential hypertension   . VITAMIN D DEFICIENCY 12/03/2009  . Depression 08/10/2008  . PULMONARY NODULE 07/15/2008  . NEOPLASM, KIDNEY 03/16/2008  . STRESS INCONTINENCE 03/16/2008  . COLONIC POLYPS 07/19/2007  . Diabetes mellitus without complication ([place]) 32/54/9826  . OBESITY 07/19/2007  . ANEMIA 07/19/2007  . Asthma 07/19/2007  . DIVERTICULOSIS OF COLON 07/19/2007  . ANXIETY 03/19/2007  . GERD 03/19/2007  . Osteoarthritis 03/19/2007  . FIBROMYALGIA 03/19/2007  . Allergic state 03/19/2007   Current Outpatient Medications on File Prior to Visit  Medication Sig Dispense Refill  . ACCU-CHEK AVIVA PLUS test strip TEST ONCE PER DAY AND DIAGNOSIS CODE IS E 11.9 100 each 1  . ACCU-CHEK FASTCLIX LANCETS MISC Test once per day and diagnosis code is E 11.9 102 each 1  . acetaminophen (TYLENOL) 500 MG tablet Take 500 mg by mouth every 6 (six) hours as needed for headache (pain).     Marland Kitchen albuterol (PROVENTIL) (2.5 MG/3ML) 0.083% nebulizer solution Take 3 mLs (2.5 mg total) by nebulization every 4 (four)  hours as needed for wheezing or shortness of breath. 75 mL 3  . albuterol (VENTOLIN HFA) 108 (90 BASE) MCG/ACT inhaler Inhale 2 puffs into the lungs every 4 (four) hours as needed for wheezing or shortness of breath. 18 each 11  . ALPRAZolam (XANAX) 0.5 MG tablet TAKE 1 TABLET BY MOUTH 3 TIMES A DAY AS NEEDED 90 tablet 5  . AMBULATORY NON FORMULARY MEDICATION Medication Name: Budesonide 5 mg Take 2 tablets by mouth daily 60 tablet 2  . amLODipine (NORVASC) 10 MG tablet TAKE 1 TABLET BY MOUTH EVERY DAY 90 tablet 3  . atenolol (TENORMIN) 50 MG tablet Take 1 tablet (50 mg total) by mouth daily. 90 tablet 3  . atorvastatin (LIPITOR) 10 MG tablet TAKE 1 TABLET BY MOUTH DAILY. 90 tablet 3  . azelastine (ASTELIN) 0.1 % nasal spray Place 1 spray into both nostrils at bedtime.    . betamethasone valerate (VALISONE) 0.1 % cream Apply topically 2 (two) times daily. 30 g 2  . budesonide-formoterol (SYMBICORT) 160-4.5 MCG/ACT inhaler Inhale 2 puffs into the lungs 2 (two) times daily.    . Cholecalciferol (VITAMIN D-3) 5000 UNITS TABS Take 5,000 Units by mouth daily.      . clarithromycin (BIAXIN) 250 MG tablet Take 1 tablet (250 mg total) by mouth 2 (two) times daily. 14 tablet 0  . clopidogrel (PLAVIX) 75 MG tablet TAKE 1 TABLET BY MOUTH EVERY DAY 90 tablet 3  . clotrimazole (LOTRIMIN) 1 % external solution Apply 1 application topically 2 (two) times daily. In  between toes 60 mL 5  . diclofenac sodium (VOLTAREN) 1 % GEL Apply 2 g topically 4 (four) times daily.    Marland Kitchen dicyclomine (BENTYL) 10 MG capsule Take 1 capsule (10 mg total) by mouth 3 (three) times daily before meals. 90 capsule 11  . fluticasone (FLONASE) 50 MCG/ACT nasal spray PLACE 2 SPRAYS IN EACH NOSTRIL AT BEDTIME 16 g 6  . furosemide (LASIX) 40 MG tablet TAKE 2 TABLETS (80 MG) BY MOUTH DAILY 270 tablet 3  . glimepiride (AMARYL) 1 MG tablet TAKE 1 TABLET BY MOUTH DAILY BEFORE BREAKFAST. 90 tablet 3  . glycopyrrolate (ROBINUL) 1 MG tablet Take 1  tablet (1 mg total) by mouth 2 (two) times daily. 60 tablet 11  . HYDROcodone-acetaminophen (NORCO) 10-325 MG tablet Take 1 tablet by mouth every 6 (six) hours as needed for moderate pain. 120 tablet 0  . HYDROcodone-homatropine (HYDROMET) 5-1.5 MG/5ML syrup Take 5 mLs by mouth every 4 (four) hours as needed. 240 mL 0  . hydrOXYzine (ATARAX/VISTARIL) 25 MG tablet TAKE 1 TABLET EVERY 4 HOURS AS NEEDED FOR ITCHING 50 tablet 0  . isosorbide mononitrate (IMDUR) 60 MG 24 hr tablet TAKE 1 TABLET BY MOUTH DAILY, PLEASE SCHEDULE NOVEMBER 2018 FOLLOW-UP [phone] 90 tablet 3  . ketoconazole (NIZORAL) 2 % cream Apply 1 application topically daily. 15 g 1  . KLOR-CON M20 20 MEQ tablet TAKE 1 TABLET (20 MEQ TOTAL) BY MOUTH 2 (TWO) TIMES DAILY. 180 tablet 3  . levocetirizine (XYZAL) 5 MG tablet TAKE 1 TABLET BY MOUTH EVERY DAY IN THE EVENING 90 tablet 3  . LIDODERM 5 % APPLY 1 PATCH TO SKIN FOR 12 HOURS THEN REMOVE AND DISCARD PATCH WITHIN 72 HOURS OR AS DIRECTED (Patient taking differently: APPLY 1 PATCH TO SKIN FOR 12 HOURS THEN REMOVE AND DISCARD PATCH WITHIN 72 HOURS AS NEEDED FOR PAIN) 30 patch 2  . loperamide (IMODIUM) 2 MG capsule Take 4-6 mg by mouth daily.    Marland Kitchen losartan (COZAAR) 50 MG tablet Take 1 tablet (50 mg total) by mouth daily. 90 tablet 3  . magnesium oxide (MAG-OX) 400 MG tablet Take 1 tablet (400 mg total) by mouth daily. 90 tablet 3  . Mesalamine (DELZICOL) 400 MG CPDR DR capsule Take 4 capsules (1,600 mg total) by mouth 3 (three) times daily. 360 capsule 11  . metFORMIN (GLUCOPHAGE) 1000 MG tablet Take 1 tablet (1,000 mg total) by mouth 2 (two) times daily with a meal. 180 tablet 2  . methylPREDNISolone (MEDROL DOSEPAK) 4 MG TBPK tablet Take as directed 21 tablet 0  . montelukast (SINGULAIR) 10 MG tablet TAKE 1 TABLET BY MOUTH EVERYDAY AT BEDTIME 90 tablet 0  . nabumetone (RELAFEN) 750 MG tablet TAKE 1 TABLET BY MOUTH TWICE A DAY WITH A MEAL Please schedule an office visit for more  refills. thanks 180 tablet 0  . neomycin-polymyxin-hydrocortisone (CORTISPORIN) otic solution Use 1 drop in affected ear three times daily as needed 10 mL 0  . nitroGLYCERIN (NITROSTAT) 0.4 MG SL tablet Place 1 tablet (0.4 mg total) under the tongue every 5 (five) minutes as needed for chest pain. 25 tablet 11  . Olopatadine HCl (PAZEO) 0.7 % SOLN Place 1 drop into both eyes daily as needed (itching/allergies).    . ondansetron (ZOFRAN) 4 MG tablet Take 1 tablet (4 mg total) by mouth every 6 (six) hours as needed for nausea or vomiting. 40 tablet 0  . pantoprazole (PROTONIX) 40 MG tablet TAKE 1 TABLET BY MOUTH TWICE DAILY  180 tablet 3  . Polyethyl Glycol-Propyl Glycol (SYSTANE OP) Place 1 drop into both eyes daily. For dry eyes    . tiZANidine (ZANAFLEX) 4 MG tablet TAKE 1 TABLET (4 MG TOTAL) BY MOUTH TWICE DAILY 90 tablet 11   Current Facility-Administered Medications on File Prior to Visit  Medication Dose Route Frequency Provider Last Rate Last Dose  . ipratropium-albuterol (DUONEB) 0.5-2.5 (3) MG/3ML nebulizer solution 3 mL  3 mL Nebulization Once Dorothyann Peng, NP       Allergies  Allergen Reactions  . Doxycycline Other (See Comments)    Unsteady gait  . Amoxicillin Other (See Comments)    Unknown reaction  Has patient had a PCN reaction causing immediate rash, facial/tongue/throat swelling, SOB or lightheadedness with hypotension: unk Has patient had a PCN reaction causing severe rash involving mucus membranes or skin necrosis: unk Has patient had a PCN reaction that required hospitalization: unk Has patient had a PCN reaction occurring within the last 10 years: no If all of the above answers are "NO", then may proceed with Cephalosporin use.   . Aspirin Nausea And Vomiting  . Azithromycin Other (See Comments)    REACTION: pt states "ZPak doesn't work"  . Caffeine Nausea And Vomiting  . Ciprofloxacin Nausea And Vomiting  . Codeine Nausea And Vomiting  . Oxycodone     Pt stated,  "upsets my stomach" (11/19/17)  . Sulfamethizole Other (See Comments)    Unknown reaction  . Tramadol Hcl Other (See Comments)    REACTION: pt states "spaced-out"    Recent Results (from the past 2160 hour(s))  Lipid panel     Status: None   Collection Time: 02/06/18 10:03 AM  Result Value Ref Range   Cholesterol 152 0 - 200 mg/dL    Comment: ATP III Classification       Desirable:  < 200 mg/dL               Borderline High:  200 - 239 mg/dL          High:  > = 240 mg/dL   Triglycerides 117.0 0.0 - 149.0 mg/dL    Comment: Normal:  <150 mg/dLBorderline High:  150 - 199 mg/dL   HDL 58.40 >39.00 mg/dL   VLDL 23.4 0.0 - 40.0 mg/dL   LDL Cholesterol 70 0 - 99 mg/dL   Total CHOL/HDL Ratio 3     Comment:                Men          Women1/2 Average Risk     3.4          3.3Average Risk          5.0          4.42X Average Risk          9.6          7.13X Average Risk          15.0          11.0                       NonHDL 93.42     Comment: NOTE:  Non-HDL goal should be 30 mg/dL higher than patient's LDL goal (i.e. LDL goal of < 70 mg/dL, would have non-HDL goal of < 100 mg/dL)  Basic metabolic panel     Status: Abnormal   Collection Time: 02/06/18 10:03 AM  Result Value Ref Range  Sodium 141 135 - 145 mEq/L   Potassium 4.0 3.5 - 5.1 mEq/L   Chloride 101 96 - 112 mEq/L   CO2 30 19 - 32 mEq/L   Glucose, Bld 152 (H) 70 - 99 mg/dL   BUN 11 6 - 23 mg/dL   Creatinine, Ser 0.48 0.40 - 1.20 mg/dL   Calcium 9.3 8.4 - 10.5 mg/dL   GFR 161.91 >60.00 mL/min  Hemoglobin A1c     Status: Abnormal   Collection Time: 02/06/18 10:03 AM  Result Value Ref Range   Hgb A1c MFr Bld 7.9 (H) 4.6 - 6.5 %    Comment: Glycemic Control Guidelines for People with Diabetes:Non Diabetic:  <6%Goal of Therapy: <7%Additional Action Suggested:  >8%   CBC with Differential/Platelet     Status: Abnormal   Collection Time: 02/06/18 10:03 AM  Result Value Ref Range   WBC 11.1 (H) 4.0 - 10.5 K/uL   RBC 4.16 3.87 - 5.11  Mil/uL   Hemoglobin 12.2 12.0 - 15.0 g/dL   HCT 38.2 36.0 - 46.0 %   MCV 91.9 78.0 - 100.0 fl   MCHC 31.9 30.0 - 36.0 g/dL   RDW 14.3 11.5 - 15.5 %   Platelets 435.0 (H) 150.0 - 400.0 K/uL   Neutrophils Relative % 73.1 43.0 - 77.0 %   Lymphocytes Relative 16.2 12.0 - 46.0 %   Monocytes Relative 6.6 3.0 - 12.0 %   Eosinophils Relative 3.7 0.0 - 5.0 %   Basophils Relative 0.4 0.0 - 3.0 %   Neutro Abs 8.1 (H) 1.4 - 7.7 K/uL   Lymphs Abs 1.8 0.7 - 4.0 K/uL   Monocytes Absolute 0.7 0.1 - 1.0 K/uL   Eosinophils Absolute 0.4 0.0 - 0.7 K/uL   Basophils Absolute 0.0 0.0 - 0.1 K/uL  TSH     Status: None   Collection Time: 02/06/18 10:03 AM  Result Value Ref Range   TSH 0.77 0.35 - 4.50 uIU/mL  Hepatic function panel     Status: None   Collection Time: 02/06/18 10:03 AM  Result Value Ref Range   Total Bilirubin 0.5 0.2 - 1.2 mg/dL   Bilirubin, Direct 0.1 0.0 - 0.3 mg/dL   Alkaline Phosphatase 55 39 - 117 U/L   AST 12 0 - 37 U/L   ALT 7 0 - 35 U/L   Total Protein 7.3 6.0 - 8.3 g/dL   Albumin 4.5 3.5 - 5.2 g/dL    Objective: General: Patient is awake, alert, and oriented x 3 and in no acute distress.  Integument: Skin is warm, dry and supple bilateral. Nails are tender, long, thickened and dystrophic with subungual debris, consistent with onychomycosis, 1-5 bilateral.  No open lesions or preulcerative lesions present bilateral. Scaly rash at left medial arch. Remaining integument unremarkable.  Vasculature:  Dorsalis Pedis pulse 1/4 bilateral. Posterior Tibial pulse  0/4 bilateral. Capillary fill time <3 sec 1-5 bilateral. Scant hair growth to the level of the digits.Temperature gradient within normal limits. No varicosities present bilateral. + edema present bilateral.   Neurology: The patient has intact sensation measured with a 5.07/10g Semmes Weinstein Monofilament at all pedal sites bilateral. Vibratory sensation intact bilateral with tuning fork. No Babinski sign present bilateral.    Musculoskeletal:Mild asymptomatic bunion pedal deformities noted bilateral. No pain with palpation bilateral. Muscular strength 5/5 in all lower extremity muscular groups bilateral without pain on range of motion . No tenderness with calf compression bilateral.  Assessment and Plan: Problem List Items Addressed This Visit  Endocrine   Diabetes mellitus without complication ([place])    Other Visit Diagnoses    Dermatophytosis of nail    -  Primary   Diabetic peripheral vascular disease ([place])       Foot pain, bilateral       Dermatitis       Relevant Medications   clobetasol cream (TEMOVATE) 0.05 %      -Examined patient. -Discussed and educated patient on diabetic foot care, especially with  regards to the vascular, neurological and musculoskeletal systems.  -Stressed the importance of good glycemic control and the detriment of not controlling glucose levels in relation to the foot. -Mechanically debrided all nails 1-5 bilateral using sterile nail nipper and filed with dremel without incident  -Rx Clobetasol to use at left arch -Answered all patient questions -Patient to return in 3 months for at risk foot care -Patient advised to call the office if any problems or questions arise in the meantime.  Landis Martins, DPM

## 2018-03-03 ENCOUNTER — Other Ambulatory Visit: Payer: Self-pay | Admitting: Cardiology

## 2018-03-17 ENCOUNTER — Other Ambulatory Visit: Payer: Self-pay | Admitting: Gastroenterology

## 2018-03-20 ENCOUNTER — Other Ambulatory Visit: Payer: Self-pay

## 2018-03-20 DIAGNOSIS — M5106 Intervertebral disc disorders with myelopathy, lumbar region: Secondary | ICD-10-CM | POA: Diagnosis not present

## 2018-04-03 ENCOUNTER — Telehealth: Payer: Self-pay

## 2018-04-03 NOTE — Telephone Encounter (Signed)
   [facility] Pre-operative Risk Assessment    Request for surgical clearance:  1. What type of surgery is being performed?  Lumber Epidural Steroid L 3-4 and L 4-5   2. When is this surgery scheduled?  TBD   3. What type of clearance is required (medical clearance vs. Pharmacy clearance to hold med vs. Both)? Pharmacy  4. Are there any medications that need to be held prior to surgery and how long? Plavix, request for patient to hold Plavix 7 days prior to Epidural   5. Practice name and name of physician performing surgery? [facility], Dr. Laroy Apple   6. What is your office phone number  [phone]   7.   What is your office fax number [phone] Attn: X-RAY  8.   Anesthesia type (None, local, MAC, general) ?Unknown   Michaelyn Barter 04/03/2018, 3:30 PM

## 2018-04-04 NOTE — Telephone Encounter (Signed)
Primary Cardiologist: Candee Furbish, MD  Chart reviewed as part of pre-operative protocol coverage. Patient was contacted 04/04/2018 in reference to pre-operative risk assessment for pending surgery as outlined below.  [name] was last seen on 12/10/17 by Dr. Marlou Porch at which time he cleared her to have epidural injection.  Since that day, [name] has done well with no new cardiac complaints. Per Dr. Marlou Porch prior note she is a moderated cardiovascular risk based on her prior coronary artery disease which has been stable.   She may hold Plavix for 7 days prior to procedure.   Therefore, based on ACC/AHA guidelines, the patient would be at acceptable risk for the planned procedure without further cardiovascular testing.   I will route this recommendation to the requesting party via Epic fax function and remove from pre-op pool.  Please call with questions.  Daune Perch, NP 04/04/2018, 11:07 AM

## 2018-04-11 ENCOUNTER — Other Ambulatory Visit: Payer: Self-pay | Admitting: Family Medicine

## 2018-04-17 DIAGNOSIS — M545 Low back pain: Secondary | ICD-10-CM | POA: Diagnosis not present

## 2018-04-17 DIAGNOSIS — M48061 Spinal stenosis, lumbar region without neurogenic claudication: Secondary | ICD-10-CM | POA: Diagnosis not present

## 2018-04-21 ENCOUNTER — Other Ambulatory Visit: Payer: Self-pay | Admitting: Cardiology

## 2018-04-30 HISTORY — PX: CATARACT EXTRACTION, BILATERAL: SHX1313

## 2018-05-07 ENCOUNTER — Other Ambulatory Visit: Payer: Self-pay | Admitting: Cardiology

## 2018-05-11 ENCOUNTER — Other Ambulatory Visit: Payer: Self-pay | Admitting: Family Medicine

## 2018-05-13 DIAGNOSIS — H11159 Pinguecula, unspecified eye: Secondary | ICD-10-CM | POA: Diagnosis not present

## 2018-05-13 DIAGNOSIS — H5202 Hypermetropia, left eye: Secondary | ICD-10-CM | POA: Diagnosis not present

## 2018-05-13 DIAGNOSIS — Z7984 Long term (current) use of oral hypoglycemic drugs: Secondary | ICD-10-CM | POA: Diagnosis not present

## 2018-05-13 DIAGNOSIS — H524 Presbyopia: Secondary | ICD-10-CM | POA: Diagnosis not present

## 2018-05-13 DIAGNOSIS — E119 Type 2 diabetes mellitus without complications: Secondary | ICD-10-CM | POA: Diagnosis not present

## 2018-05-13 DIAGNOSIS — I10 Essential (primary) hypertension: Secondary | ICD-10-CM | POA: Diagnosis not present

## 2018-05-13 DIAGNOSIS — H1045 Other chronic allergic conjunctivitis: Secondary | ICD-10-CM | POA: Diagnosis not present

## 2018-05-13 DIAGNOSIS — H52223 Regular astigmatism, bilateral: Secondary | ICD-10-CM | POA: Diagnosis not present

## 2018-05-13 DIAGNOSIS — H25813 Combined forms of age-related cataract, bilateral: Secondary | ICD-10-CM | POA: Diagnosis not present

## 2018-05-13 LAB — HM DIABETES EYE EXAM

## 2018-05-14 DIAGNOSIS — M48061 Spinal stenosis, lumbar region without neurogenic claudication: Secondary | ICD-10-CM | POA: Diagnosis not present

## 2018-05-14 DIAGNOSIS — Z6841 Body Mass Index (BMI) 40.0 and over, adult: Secondary | ICD-10-CM | POA: Diagnosis not present

## 2018-05-20 ENCOUNTER — Ambulatory Visit: Payer: Medicare Other | Admitting: Sports Medicine

## 2018-05-21 ENCOUNTER — Ambulatory Visit (INDEPENDENT_AMBULATORY_CARE_PROVIDER_SITE_OTHER): Payer: Medicare Other

## 2018-05-21 ENCOUNTER — Ambulatory Visit (INDEPENDENT_AMBULATORY_CARE_PROVIDER_SITE_OTHER): Payer: Medicare Other | Admitting: Podiatry

## 2018-05-21 DIAGNOSIS — M79675 Pain in left toe(s): Secondary | ICD-10-CM

## 2018-05-21 DIAGNOSIS — B351 Tinea unguium: Secondary | ICD-10-CM

## 2018-05-21 DIAGNOSIS — S9032XA Contusion of left foot, initial encounter: Secondary | ICD-10-CM

## 2018-05-21 DIAGNOSIS — M79674 Pain in right toe(s): Secondary | ICD-10-CM | POA: Diagnosis not present

## 2018-05-21 NOTE — Patient Instructions (Signed)

## 2018-06-02 ENCOUNTER — Encounter: Payer: Self-pay | Admitting: Podiatry

## 2018-06-02 NOTE — Progress Notes (Addendum)
Subjective: [name] presents today for diabetic foot care with painful, thick toenails 1-5 b/l that she cannot cut and which interfere with daily activities.  Pain is aggravated when wearing enclosed shoe gear.  She relates she hit her left foot on her walker yesterday and today she is experiencing pain in her left fifth digit.  [name], MD is her PCP and last visit was February 06, 2018.   Current Outpatient Medications:  .  ACCU-CHEK AVIVA PLUS test strip, TEST ONCE PER DAY AND DIAGNOSIS CODE IS E 11.9, Disp: 100 each, Rfl: 1 .  ACCU-CHEK FASTCLIX LANCETS MISC, Test once per day and diagnosis code is E 11.9, Disp: 102 each, Rfl: 1 .  acetaminophen (TYLENOL) 500 MG tablet, Take 500 mg by mouth every 6 (six) hours as needed for headache (pain). , Disp: , Rfl:  .  albuterol (PROVENTIL) (2.5 MG/3ML) 0.083% nebulizer solution, Take 3 mLs (2.5 mg total) by nebulization every 4 (four) hours as needed for wheezing or shortness of breath., Disp: 75 mL, Rfl: 3 .  albuterol (VENTOLIN HFA) 108 (90 BASE) MCG/ACT inhaler, Inhale 2 puffs into the lungs every 4 (four) hours as needed for wheezing or shortness of breath., Disp: 18 each, Rfl: 11 .  ALPRAZolam (XANAX) 0.5 MG tablet, TAKE 1 TABLET BY MOUTH 3 TIMES A DAY AS NEEDED, Disp: 90 tablet, Rfl: 5 .  AMBULATORY NON FORMULARY MEDICATION, Medication Name: Budesonide 5 mg Take 2 tablets by mouth daily, Disp: 60 tablet, Rfl: 2 .  amLODipine (NORVASC) 10 MG tablet, TAKE 1 TABLET BY MOUTH EVERY DAY, Disp: 90 tablet, Rfl: 2 .  atenolol (TENORMIN) 50 MG tablet, Take 1 tablet (50 mg total) by mouth daily., Disp: 90 tablet, Rfl: 3 .  atorvastatin (LIPITOR) 10 MG tablet, TAKE 1 TABLET BY MOUTH EVERY DAY, Disp: 90 tablet, Rfl: 2 .  azelastine (ASTELIN) 0.1 % nasal spray, Place 1 spray into both nostrils at bedtime., Disp: , Rfl:  .  betamethasone valerate (VALISONE) 0.1 % cream, Apply topically 2 (two) times daily., Disp: 30 g, Rfl: 2 .   budesonide-formoterol (SYMBICORT) 160-4.5 MCG/ACT inhaler, Inhale 2 puffs into the lungs 2 (two) times daily., Disp: , Rfl:  .  Cholecalciferol (VITAMIN D-3) 5000 UNITS TABS, Take 5,000 Units by mouth daily.  , Disp: , Rfl:  .  clarithromycin (BIAXIN) 250 MG tablet, Take 1 tablet (250 mg total) by mouth 2 (two) times daily., Disp: 14 tablet, Rfl: 0 .  clopidogrel (PLAVIX) 75 MG tablet, TAKE 1 TABLET BY MOUTH EVERY DAY, Disp: 90 tablet, Rfl: 2 .  clotrimazole (LOTRIMIN) 1 % external solution, Apply 1 application topically 2 (two) times daily. In between toes, Disp: 60 mL, Rfl: 5 .  DELZICOL 400 MG CPDR DR capsule, TAKE 4 CAPSULES (1,600 MG TOTAL) BY MOUTH 3 (THREE) TIMES DAILY., Disp: 1080 capsule, Rfl: 1 .  diclofenac sodium (VOLTAREN) 1 % GEL, Apply 2 g topically 4 (four) times daily., Disp: , Rfl:  .  dicyclomine (BENTYL) 10 MG capsule, Take 1 capsule (10 mg total) by mouth 3 (three) times daily before meals., Disp: 90 capsule, Rfl: 11 .  fluticasone (FLONASE) 50 MCG/ACT nasal spray, PLACE 2 SPRAYS IN EACH NOSTRIL AT BEDTIME, Disp: 16 g, Rfl: 6 .  furosemide (LASIX) 40 MG tablet, Take 1 tablet (40 mg total) by mouth 2 (two) times daily. TAKE 1 TABLET (40 MG TOTAL) BY MOUTH 3 (THREE) TIMES DAILY., Disp: 180 tablet, Rfl: 2 .  glimepiride (AMARYL) 1 MG  tablet, TAKE 1 TABLET BY MOUTH DAILY BEFORE BREAKFAST., Disp: 90 tablet, Rfl: 3 .  glycopyrrolate (ROBINUL) 1 MG tablet, Take 1 tablet (1 mg total) by mouth 2 (two) times daily., Disp: 60 tablet, Rfl: 11 .  halobetasol (ULTRAVATE) 0.05 % cream, Please specify directions, refills and quantity, Disp: 1 g, Rfl: 0 .  HYDROcodone-acetaminophen (NORCO) 10-325 MG tablet, Take 1 tablet by mouth every 6 (six) hours as needed for moderate pain., Disp: 120 tablet, Rfl: 0 .  HYDROcodone-homatropine (HYDROMET) 5-1.5 MG/5ML syrup, Take 5 mLs by mouth every 4 (four) hours as needed., Disp: 240 mL, Rfl: 0 .  hydrOXYzine (ATARAX/VISTARIL) 25 MG tablet, TAKE 1 TABLET  EVERY 4 HOURS AS NEEDED FOR ITCHING, Disp: 50 tablet, Rfl: 0 .  hydrOXYzine (VISTARIL) 25 MG capsule, , Disp: , Rfl:  .  isosorbide mononitrate (IMDUR) 60 MG 24 hr tablet, Take 1 tablet (60 mg total) by mouth daily., Disp: 90 tablet, Rfl: 2 .  ketoconazole (NIZORAL) 2 % cream, Apply 1 application topically daily., Disp: 15 g, Rfl: 1 .  KLOR-CON M20 20 MEQ tablet, TAKE 1 TABLET BY MOUTH TWICE A DAY, Disp: 180 tablet, Rfl: 3 .  levocetirizine (XYZAL) 5 MG tablet, TAKE 1 TABLET BY MOUTH EVERY DAY IN THE EVENING, Disp: 90 tablet, Rfl: 3 .  LIDODERM 5 %, APPLY 1 PATCH TO SKIN FOR 12 HOURS THEN REMOVE AND DISCARD PATCH WITHIN 72 HOURS OR AS DIRECTED (Patient taking differently: APPLY 1 PATCH TO SKIN FOR 12 HOURS THEN REMOVE AND DISCARD PATCH WITHIN 72 HOURS AS NEEDED FOR PAIN), Disp: 30 patch, Rfl: 2 .  loperamide (IMODIUM) 2 MG capsule, Take 4-6 mg by mouth daily., Disp: , Rfl:  .  losartan (COZAAR) 50 MG tablet, Take 1 tablet (50 mg total) by mouth daily., Disp: 90 tablet, Rfl: 3 .  magnesium oxide (MAG-OX) 400 MG tablet, Take 1 tablet (400 mg total) by mouth daily., Disp: 90 tablet, Rfl: 3 .  metFORMIN (GLUCOPHAGE) 1000 MG tablet, Take 1 tablet (1,000 mg total) by mouth 2 (two) times daily with a meal., Disp: 180 tablet, Rfl: 2 .  methylPREDNISolone (MEDROL DOSEPAK) 4 MG TBPK tablet, Take as directed, Disp: 21 tablet, Rfl: 0 .  montelukast (SINGULAIR) 10 MG tablet, TAKE 1 TABLET BY MOUTH EVERYDAY AT BEDTIME, Disp: 90 tablet, Rfl: 0 .  nabumetone (RELAFEN) 750 MG tablet, TAKE 1 TABLET BY MOUTH TWICE A DAY WITH MEAL, Disp: 180 tablet, Rfl: 0 .  neomycin-polymyxin-hydrocortisone (CORTISPORIN) otic solution, Use 1 drop in affected ear three times daily as needed, Disp: 10 mL, Rfl: 0 .  nitroGLYCERIN (NITROSTAT) 0.4 MG SL tablet, Place 1 tablet (0.4 mg total) under the tongue every 5 (five) minutes as needed for chest pain., Disp: 25 tablet, Rfl: 11 .  Olopatadine HCl (PAZEO) 0.7 % SOLN, Place 1 drop into  both eyes daily as needed (itching/allergies)., Disp: , Rfl:  .  ondansetron (ZOFRAN) 4 MG tablet, Take 1 tablet (4 mg total) by mouth every 6 (six) hours as needed for nausea or vomiting., Disp: 40 tablet, Rfl: 0 .  pantoprazole (PROTONIX) 40 MG tablet, TAKE 1 TABLET BY MOUTH TWICE DAILY, Disp: 180 tablet, Rfl: 3 .  Polyethyl Glycol-Propyl Glycol (SYSTANE OP), Place 1 drop into both eyes daily. For dry eyes, Disp: , Rfl:  .  tiZANidine (ZANAFLEX) 4 MG tablet, TAKE 1 TABLET (4 MG TOTAL) BY MOUTH TWICE DAILY, Disp: 90 tablet, Rfl: 11  Current Facility-Administered Medications:  .  ipratropium-albuterol (DUONEB) 0.5-2.5 (3)  MG/3ML nebulizer solution 3 mL, 3 mL, Nebulization, Once, Nafziger, Tommi Rumps, NP  Allergies  Allergen Reactions  . Doxycycline Other (See Comments)    Unsteady gait  . Amoxicillin Other (See Comments)    Unknown reaction  Has patient had a PCN reaction causing immediate rash, facial/tongue/throat swelling, SOB or lightheadedness with hypotension: unk Has patient had a PCN reaction causing severe rash involving mucus membranes or skin necrosis: unk Has patient had a PCN reaction that required hospitalization: unk Has patient had a PCN reaction occurring within the last 10 years: no If all of the above answers are "NO", then may proceed with Cephalosporin use.   . Aspirin Nausea And Vomiting  . Azithromycin Other (See Comments)    REACTION: pt states "ZPak doesn't work"  . Caffeine Nausea And Vomiting  . Ciprofloxacin Nausea And Vomiting  . Codeine Nausea And Vomiting  . Oxycodone     Pt stated, "upsets my stomach" (11/19/17)  . Sulfamethizole Other (See Comments)    Unknown reaction  . Tramadol Hcl Other (See Comments)    REACTION: pt states "spaced-out"   Review of systems: Musculoskeletal: Left fifth digit pain Objective:  Vascular Examination: Capillary refill time less than 3 seconds x 10 digits  Dorsalis pedis 1/4 bilaterally    Posterior tibial 0/4  bilaterally  Digital hair sparse x 10 digits  Skin temperature gradient WNL b/l  Mild edema left fifth digit  Localized edema present bilateral lower extremities  Dermatological Examination: Skin with normal turgor, texture and tone b/l  Toenails 1-5 b/l discolored, thick, dystrophic with subungual debris and pain with palpation to nailbeds due to thickness of nails.  No breaks in skin left fifth digit.  Musculoskeletal: Muscle strength 5/5 to all LE muscle groups  Hallux abductovalgus with bunion deformity bilaterally  No tenderness with calf compression bilaterally  Pain on palpation left fifth digit.  There is pain on active and passive range of motion fifth digit.  No pain, crepitus or joint limitation noted with ROM.   Neurological: Sensation intact with 10 gram monofilament.  Vibratory sensation intact.  X-ray left foot reveals no fracture.  Assessment: Painful onychomycosis toenails 1-5 b/l  Contusion left fifth digit  Plan: 1. Toenails 1-5 b/l were debrided in length and girth without iatrogenic bleeding. 2. X-ray performed left foot 3. Discussed x-ray left foot and diagnosis of contusion with patient.  It is self-limiting.  It should resolve within the next couple of weeks.  Patient related understanding. 4. Patient to continue soft, supportive shoe gear 5. Patient to report any pedal injuries to medical professional immediately. 6. Follow up 3 months. Patient/POA to call should there be a concern in the interim.

## 2018-06-30 ENCOUNTER — Other Ambulatory Visit: Payer: Self-pay | Admitting: Cardiology

## 2018-07-01 DIAGNOSIS — H25013 Cortical age-related cataract, bilateral: Secondary | ICD-10-CM | POA: Diagnosis not present

## 2018-07-01 DIAGNOSIS — E119 Type 2 diabetes mellitus without complications: Secondary | ICD-10-CM | POA: Diagnosis not present

## 2018-07-01 DIAGNOSIS — H2513 Age-related nuclear cataract, bilateral: Secondary | ICD-10-CM | POA: Diagnosis not present

## 2018-07-01 DIAGNOSIS — H2511 Age-related nuclear cataract, right eye: Secondary | ICD-10-CM | POA: Diagnosis not present

## 2018-07-01 DIAGNOSIS — H25043 Posterior subcapsular polar age-related cataract, bilateral: Secondary | ICD-10-CM | POA: Diagnosis not present

## 2018-07-03 ENCOUNTER — Telehealth: Payer: Self-pay | Admitting: *Deleted

## 2018-07-03 NOTE — Telephone Encounter (Signed)
   [facility] Pre-operative Risk Assessment    Request for surgical clearance:  1. What type of surgery is being performed? CATARACT EXTRACTION W/INTROCULAR LENS IMPLANT OF RIGHT EYE FOLLOWED BY LEFT EYE   2. When is this surgery scheduled? 08/04/18   3. What type of clearance is required (medical clearance vs. Pharmacy clearance to hold med vs. Both)? MEDICAL  4. Are there any medications that need to be held prior to surgery and how long?PLAVIX DOES NOT NEED TO BE HELD PER SURGEON   5. Practice name and name of physician performing surgery? [facility] ; DR. Talbert Forest   6. What is your office phone number [phone]    7.   What is your office fax number [phone]  8.   Anesthesia type (None, local, MAC, general) ? TOPICAL   [name] 07/03/2018, 3:35 PM  _________________________________________________________________   (provider comments below)

## 2018-07-04 NOTE — Telephone Encounter (Signed)
   Primary Cardiologist: Candee Furbish, MD  Chart reviewed as part of pre-operative protocol coverage. Cataract extractions are recognized in guidelines as low risk surgeries that do not typically require specific preoperative testing or holding of blood thinner therapy. Therefore, given past medical history and time since last visit, based on ACC/AHA guidelines, Reonna Finlayson would be at acceptable risk for the planned procedure without further cardiovascular testing.   I will route this recommendation to the requesting party via Epic fax function and remove from pre-op pool.  Please call with questions.  Lyda Jester, PA-C 07/04/2018, 8:18 AM

## 2018-07-05 ENCOUNTER — Other Ambulatory Visit: Payer: Self-pay | Admitting: Gastroenterology

## 2018-07-21 ENCOUNTER — Other Ambulatory Visit: Payer: Self-pay | Admitting: Family Medicine

## 2018-07-22 NOTE — Telephone Encounter (Signed)
Dr. Sarajane Jews please advise on refills thanks

## 2018-08-02 ENCOUNTER — Other Ambulatory Visit: Payer: Self-pay | Admitting: Family Medicine

## 2018-08-12 ENCOUNTER — Other Ambulatory Visit: Payer: Self-pay | Admitting: Cardiology

## 2018-08-12 ENCOUNTER — Other Ambulatory Visit: Payer: Self-pay | Admitting: Family Medicine

## 2018-08-14 DIAGNOSIS — J4540 Moderate persistent asthma, uncomplicated: Secondary | ICD-10-CM | POA: Diagnosis not present

## 2018-08-14 DIAGNOSIS — J309 Allergic rhinitis, unspecified: Secondary | ICD-10-CM | POA: Diagnosis not present

## 2018-08-14 DIAGNOSIS — K219 Gastro-esophageal reflux disease without esophagitis: Secondary | ICD-10-CM | POA: Diagnosis not present

## 2018-08-19 ENCOUNTER — Ambulatory Visit: Payer: Medicare Other | Admitting: Podiatry

## 2018-08-20 ENCOUNTER — Other Ambulatory Visit: Payer: Self-pay | Admitting: Family Medicine

## 2018-08-21 ENCOUNTER — Telehealth: Payer: Self-pay | Admitting: Cardiology

## 2018-08-21 NOTE — Telephone Encounter (Signed)
Call in #[phone] with [phone] 

## 2018-08-21 NOTE — Telephone Encounter (Signed)
New message     Virtual video appt made on 08-28-18.  Please call grandson's cell phone at [phone].  He helped pt with PCP video call.  Pt gave consent for virtual visit.  YOUR CARDIOLOGY TEAM HAS ARRANGED FOR AN E-VISIT FOR YOUR APPOINTMENT - PLEASE REVIEW IMPORTANT INFORMATION BELOW SEVERAL DAYS PRIOR TO YOUR APPOINTMENT  Due to the recent COVID-19 pandemic, we are transitioning in-person office visits to tele-medicine visits in an effort to decrease unnecessary exposure to our patients, their families, and staff. These visits are billed to your insurance just like a normal visit is. We also encourage you to sign up for MyChart if you have not already done so. You will need a smartphone if possible. For patients that do not have this, we can still complete the visit using a regular telephone but do prefer a smartphone to enable video when possible. You may have a family member that lives with you that can help. If possible, we also ask that you have a blood pressure cuff and scale at home to measure your blood pressure, heart rate and weight prior to your scheduled appointment. Patients with clinical needs that need an in-person evaluation and testing will still be able to come to the office if absolutely necessary. If you have any questions, feel free to call our office.     YOUR PROVIDER WILL BE USING THE FOLLOWING PLATFORM TO COMPLETE YOUR VISIT: Doximity   IF USING MYCHART - How to Download the MyChart App to Your SmartPhone   - If Apple, go to [employer] and type in MyChart in the search bar and download the app. If Android, ask patient to go to [employer] and type in [place] in the search bar and download the app. The app is free but as with any other app downloads, your phone may require you to verify saved payment information or Apple/Android password.  - You will need to then log into the app with your MyChart username and password, and select [facility] as your healthcare  provider to link the account.  - When it is time for your visit, go to the MyChart app, find appointments, and click Begin Video Visit. Be sure to Select Allow for your device to access the Microphone and Camera for your visit. You will then be connected, and your provider will be with you shortly.  **If you have any issues connecting or need assistance, please contact MyChart service desk [phone])**  **If using a computer, in order to ensure the best quality for your visit, you will need to use either of the following Internet Browsers: Insurance underwriter or Microsoft Edge**   IF USING DOXIMITY or [email or web address] - The staff will give you instructions on receiving your link to join the meeting the day of your visit.      2-3 DAYS BEFORE YOUR APPOINTMENT  You will receive a telephone call from one of our [place] team members - your caller ID may say "Unknown caller." If this is a video visit, we will walk you through how to get the video launched on your phone. We will remind you check your blood pressure, heart rate and weight prior to your scheduled appointment. If you have an Apple Watch or Kardia, please upload any pertinent ECG strips the day before or morning of your appointment to [facility]. Our staff will also make sure you have reviewed the consent and agree to move forward with your scheduled tele-health visit.  THE DAY OF YOUR APPOINTMENT  Approximately 15 minutes prior to your scheduled appointment, you will receive a telephone call from one of [place] team - your caller ID may say "Unknown caller."  Our staff will confirm medications, vital signs for the day and any symptoms you may be experiencing. Please have this information available prior to the time of visit start. It may also be helpful for you to have a pad of paper and pen handy for any instructions given during your visit. They will also walk you through joining the smartphone meeting if this is a video  visit.    CONSENT FOR TELE-HEALTH VISIT - PLEASE REVIEW  I hereby voluntarily request, consent and authorize [facility] and its employed or contracted physicians, physician assistants, nurse practitioners or other licensed health care professionals (the Practitioner), to provide me with telemedicine health care services (the Services") as deemed necessary by the treating Practitioner. I acknowledge and consent to receive the Services by the Practitioner via telemedicine. I understand that the telemedicine visit will involve communicating with the Practitioner through live audiovisual communication technology and the disclosure of certain medical information by electronic transmission. I acknowledge that I have been given the opportunity to request an in-person assessment or other available alternative prior to the telemedicine visit and am voluntarily participating in the telemedicine visit.  I understand that I have the right to withhold or withdraw my consent to the use of telemedicine in the course of my care at any time, without affecting my right to future care or treatment, and that the Practitioner or I may terminate the telemedicine visit at any time. I understand that I have the right to inspect all information obtained and/or recorded in the course of the telemedicine visit and may receive copies of available information for a reasonable fee.  I understand that some of the potential risks of receiving the Services via telemedicine include:   Delay or interruption in medical evaluation due to technological equipment failure or disruption;  Information transmitted may not be sufficient (e.g. poor resolution of images) to allow for appropriate medical decision making by the Practitioner; and/or   In rare instances, security protocols could fail, causing a breach of personal health information.  Furthermore, I acknowledge that it is my responsibility to provide information about my medical  history, conditions and care that is complete and accurate to the best of my ability. I acknowledge that Practitioner's advice, recommendations, and/or decision may be based on factors not within their control, such as incomplete or inaccurate data provided by me or distortions of diagnostic images or specimens that may result from electronic transmissions. I understand that the practice of medicine is not an exact science and that Practitioner makes no warranties or guarantees regarding treatment outcomes. I acknowledge that I will receive a copy of this consent concurrently upon execution via email to the email address I last provided but may also request a printed copy by calling the office of [place] [facility].    I understand that my insurance will be billed for this visit.   I have read or had this consent read to me.  I understand the contents of this consent, which adequately explains the benefits and risks of the Services being provided via telemedicine.   I have been provided ample opportunity to ask questions regarding this consent and the Services and have had my questions answered to my satisfaction.  I give my informed consent for the services to be provided through the  use of telemedicine in my medical care  By participating in this telemedicine visit I agree to the above.

## 2018-08-21 NOTE — Telephone Encounter (Signed)
Dr. Sarajane Jews please advise on refills. Thanks

## 2018-08-26 NOTE — Telephone Encounter (Signed)
Refill called to the pharmacy and left on VM

## 2018-08-28 ENCOUNTER — Telehealth (INDEPENDENT_AMBULATORY_CARE_PROVIDER_SITE_OTHER): Payer: Medicare Other | Admitting: Cardiology

## 2018-08-28 ENCOUNTER — Other Ambulatory Visit: Payer: Self-pay

## 2018-08-28 ENCOUNTER — Encounter: Payer: Self-pay | Admitting: Cardiology

## 2018-08-28 VITALS — BP 143/93 | HR 66 | Ht 60.0 in | Wt 205.0 lb

## 2018-08-28 DIAGNOSIS — I2510 Atherosclerotic heart disease of native coronary artery without angina pectoris: Secondary | ICD-10-CM | POA: Diagnosis not present

## 2018-08-28 DIAGNOSIS — I5032 Chronic diastolic (congestive) heart failure: Secondary | ICD-10-CM

## 2018-08-28 DIAGNOSIS — I2583 Coronary atherosclerosis due to lipid rich plaque: Secondary | ICD-10-CM | POA: Diagnosis not present

## 2018-08-28 DIAGNOSIS — E7800 Pure hypercholesterolemia, unspecified: Secondary | ICD-10-CM

## 2018-08-28 DIAGNOSIS — I10 Essential (primary) hypertension: Secondary | ICD-10-CM

## 2018-08-28 NOTE — Progress Notes (Signed)
Virtual Visit via Video Note   This visit type was conducted due to national recommendations for restrictions regarding the COVID-19 Pandemic (e.g. social distancing) in an effort to limit this patient's exposure and mitigate transmission in our community.  Due to her co-morbid illnesses, this patient is at least at moderate risk for complications without adequate follow up.  This format is felt to be most appropriate for this patient at this time.  All issues noted in this document were discussed and addressed.  A limited physical exam was performed with this format.  Please refer to the patient's chart for her consent to telehealth for [facility].   Evaluation Performed:  Follow-up visit  Date:  08/28/2018   ID:  [name], DOB 10-29-1940, MRN [id number]  Patient Location: Home Provider Location: Office  PCP:  [name], MD  Cardiologist:  Candee Furbish, MD  Electrophysiologist:  None   Chief Complaint:  CAD  History of Present Illness:    [name] is a 78 y.o. female with a hx in 2002 had an BMS Express 3.5 x 20 mm stent placed in the proximal LAD with 3 subsequent catheterization showing patent stent.  Physical history of not being able to tolerate aspirin. Stomach pain.  She remains on Plavix long-term.  Other hx of chronic diastolic HF, ulcerative colitis, lower ext edema, may for dependent edema vs venous insufficiency.  HLD on statin and HTN.  Left arm focal pain sometimes, right as well. Likely noncardiac. She also discussed occasional right-sided jaw pain at rest. Nonexertional. This seems to be relieved when she stretches surgical back. Lives with son and his wife. Son encourages NTG use.   Has ulcerative colitis. Had trouble at the beach. Back, disk trouble. Discouraged by body aches.  06/04/17 - doing well. UC flair. No CP, no SOB. Likes to drink water. Keeps at beside. No SOB. Weight up.   12/10/2017- she is here for preoperative evaluation prior to  colonoscopy.  This is a low risk procedure and usually does not require cardiac risk stratification.  Denies any shortness of breath chest pain but she has been having some more swelling in her left leg she wanted to be seen prior to colonoscopy.  She states that her edema has been fairly chronic and this does happen during the summertime.  She has not had any predisposing factors to DVT.  No pain, no redness.  See below.  She did hold her plavix for 7 days prior to colonoscopy and epidural injection.    Today she is doing well.  No chest pain or SOB she lives with her son so she has not been out and when she does she wears a mask.  She has no fevers or cough.  She continues with Lt leg edema and her PCP has also seen and along with Dr. Marlou Porch believe related to venous insuff.  Her BP was up some but she does not believe her BP cuff is accurate.  Her last LDL in 01/2018 was 70.  She does have asthma and uses her nebulizers. She continues with episodic angina and will take 1 SL NTG with relief.    Her appetite has decreased only eating 2 meals per day, discussed protein bars for extra nutrition, does not like ensure.  Does not exercise much if at all due to back pain.  She uses rolling walker to get around.  The patient does not have symptoms concerning for COVID-19 infection (fever, chills, cough, or new  shortness of breath).    Past Medical History:  Diagnosis Date  . Adenomatous colon polyp 02/2006  . Allergy   . Allergy, unspecified not elsewhere classified   . Anemia   . Anxiety   . Arthritis   . Asthma   . Blood transfusion without reported diagnosis   . Bronchiectasis   . CAD (coronary artery disease)    BMS to the LAD, 2002; cath 12/07/11 patent LAD stent and mild nonobstructive disease, EF 65%; Medical management  . Cataract   . CHF (congestive heart failure) (HCC)    Diastolic  . Diabetes mellitus   . Diverticulosis of colon   . DJD (degenerative joint disease)   . Fibromyalgia    . GERD (gastroesophageal reflux disease)   . Hypercholesterolemia   . Hypertension   . Microscopic hematuria   . Neoplasm of kidney   . Obesity   . Other diseases of lung, not elsewhere classified   . Pinched vertebral nerve   . Pulmonary nodule    Negative PET in 2010  . Stress incontinence, female   . Syncope   . Ulcerative colitis, left sided ([place]) 2008   Hx of  . UTI (lower urinary tract infection)   . Vitamin D deficiency disease    Past Surgical History:  Procedure Laterality Date  . ABDOMINAL HYSTERECTOMY    . CARDIAC CATHETERIZATION  11/2011  . CHOLECYSTECTOMY    . COLONOSCOPY    . COLONOSCOPY W/ BIOPSIES  11-12-12   per Dr. Fuller Plan, diverticulosis only, repeat in 5 yrs  . CORONARY STENT PLACEMENT  2002   BMS to the LAD  . ESOPHAGOGASTRODUODENOSCOPY  12/2008  . HAND SURGERY  01/2006   Right Hand Surgery by Dr. Amedeo Plenty  . LEFT HEART CATHETERIZATION WITH CORONARY ANGIOGRAM N/A 12/07/2011   Procedure: LEFT HEART CATHETERIZATION WITH CORONARY ANGIOGRAM;  Surgeon: Peter M Martinique, MD;  Location: [facility] C[facility] LAB;  Service: Cardiovascular;  Laterality: N/A;     Current Meds  Medication Sig  . ACCU-CHEK AVIVA PLUS test strip TEST ONCE PER DAY AND DIAGNOSIS CODE IS E 11.9  . ACCU-CHEK FASTCLIX LANCETS MISC Test once per day and diagnosis code is E 11.9  . acetaminophen (TYLENOL) 500 MG tablet Take 500 mg by mouth every 6 (six) hours as needed for headache (pain).   Marland Kitchen albuterol (PROVENTIL) (2.5 MG/3ML) 0.083% nebulizer solution Take 3 mLs (2.5 mg total) by nebulization every 4 (four) hours as needed for wheezing or shortness of breath.  Marland Kitchen albuterol (VENTOLIN HFA) 108 (90 BASE) MCG/ACT inhaler Inhale 2 puffs into the lungs every 4 (four) hours as needed for wheezing or shortness of breath.  . ALPRAZolam (XANAX) 0.5 MG tablet TAKE 1 TABLET BY MOUTH THREE TIMES A DAY AS NEEDED  . amLODipine (NORVASC) 10 MG tablet TAKE 1 TABLET BY MOUTH EVERY DAY  . atenolol (TENORMIN) 50 MG tablet TAKE 1  TABLET BY MOUTH EVERY DAY  . atorvastatin (LIPITOR) 10 MG tablet TAKE 1 TABLET BY MOUTH EVERY DAY  . azelastine (ASTELIN) 0.1 % nasal spray Place 1 spray into both nostrils at bedtime.  . budesonide-formoterol (SYMBICORT) 160-4.5 MCG/ACT inhaler Inhale 2 puffs into the lungs 2 (two) times daily.  . Cholecalciferol (VITAMIN D-3) 5000 UNITS TABS Take 5,000 Units by mouth daily.    . clopidogrel (PLAVIX) 75 MG tablet TAKE 1 TABLET BY MOUTH EVERY DAY  . clotrimazole (LOTRIMIN) 1 % external solution Apply 1 application topically 2 (two) times daily. In between toes  .  DELZICOL 400 MG CPDR DR capsule TAKE 4 CAPSULES (1,600 MG TOTAL) BY MOUTH 3 (THREE) TIMES DAILY.  Marland Kitchen diclofenac sodium (VOLTAREN) 1 % GEL As needed for pain  . fluticasone (FLONASE) 50 MCG/ACT nasal spray PLACE 2 SPRAYS IN EACH NOSTRIL AT BEDTIME  . furosemide (LASIX) 40 MG tablet Take 1 tablet (40 mg total) by mouth 2 (two) times daily. TAKE 1 TABLET (40 MG TOTAL) BY MOUTH 3 (THREE) TIMES DAILY.  Marland Kitchen glimepiride (AMARYL) 1 MG tablet TAKE 1 TABLET BY MOUTH DAILY BEFORE BREAKFAST.  . halobetasol (ULTRAVATE) 0.05 % cream Please specify directions, refills and quantity  . HYDROcodone-homatropine (HYDROMET) 5-1.5 MG/5ML syrup Take 5 mLs by mouth every 4 (four) hours as needed.  . hydrOXYzine (ATARAX/VISTARIL) 25 MG tablet TAKE 1 TABLET EVERY 4 HOURS AS NEEDED FOR ITCHING  . isosorbide mononitrate (IMDUR) 60 MG 24 hr tablet Take 1 tablet (60 mg total) by mouth daily.  Marland Kitchen ketoconazole (NIZORAL) 2 % cream Apply 1 application topically daily.  Marland Kitchen KLOR-CON M20 20 MEQ tablet TAKE 1 TABLET BY MOUTH TWICE A DAY  . levocetirizine (XYZAL) 5 MG tablet TAKE 1 TABLET BY MOUTH EVERY DAY IN THE EVENING  . LIDODERM 5 % APPLY 1 PATCH TO SKIN FOR 12 HOURS THEN REMOVE AND DISCARD PATCH WITHIN 72 HOURS OR AS DIRECTED  . loperamide (IMODIUM) 2 MG capsule Take 4-6 mg by mouth daily.  Marland Kitchen losartan (COZAAR) 50 MG tablet Take 1 tablet (50 mg total) by mouth daily.  .  magnesium oxide (MAG-OX) 400 MG tablet Take 1 tablet (400 mg total) by mouth daily.  . metFORMIN (GLUCOPHAGE) 1000 MG tablet Take 1 tablet (1,000 mg total) by mouth 2 (two) times daily with a meal.  . methylPREDNISolone (MEDROL DOSEPAK) 4 MG TBPK tablet Take as directed  . montelukast (SINGULAIR) 10 MG tablet TAKE 1 TABLET BY MOUTH EVERYDAY AT BEDTIME  . nabumetone (RELAFEN) 750 MG tablet TAKE 1 TABLET BY MOUTH TWICE A DAY WITH MEAL  . neomycin-polymyxin-hydrocortisone (CORTISPORIN) otic solution Use 1 drop in affected ear three times daily as needed  . nitroGLYCERIN (NITROSTAT) 0.4 MG SL tablet Place 1 tablet (0.4 mg total) under the tongue every 5 (five) minutes as needed for chest pain.  Marland Kitchen Olopatadine HCl (PAZEO) 0.7 % SOLN Place 1 drop into both eyes daily as needed (itching/allergies).  . ondansetron (ZOFRAN) 4 MG tablet Take 1 tablet (4 mg total) by mouth every 6 (six) hours as needed for nausea or vomiting.  . pantoprazole (PROTONIX) 40 MG tablet TAKE 1 TABLET BY MOUTH TWICE DAILY  . Polyethyl Glycol-Propyl Glycol (SYSTANE OP) Place 1 drop into both eyes daily. For dry eyes  . tiZANidine (ZANAFLEX) 4 MG tablet TAKE 1 TABLET (4 MG TOTAL) BY MOUTH TWICE DAILY  . [DISCONTINUED] HYDROcodone-acetaminophen (NORCO) 10-325 MG tablet Take 1 tablet by mouth every 6 (six) hours as needed for moderate pain.   Current Facility-Administered Medications for the 08/28/18 encounter (Telemedicine) with Isaiah Serge, NP  Medication  . ipratropium-albuterol (DUONEB) 0.5-2.5 (3) MG/3ML nebulizer solution 3 mL     Allergies:   Doxycycline; Amoxicillin; Aspirin; Azithromycin; Caffeine; Ciprofloxacin; Codeine; Oxycodone; Sulfamethizole; and Tramadol hcl   Social History   Tobacco Use  . Smoking status: Former Smoker    Packs/day: 2.00    Years: 15.00    Pack years: 30.00    Types: Cigarettes    Last attempt to quit: 04/30/1973    Years since quitting: 45.3  . Smokeless tobacco: Never Used  .  Tobacco  comment: ex-smoker: smoked for 10-15 years up to 2ppd, quit 1975.  Substance Use Topics  . Alcohol use: Yes    Alcohol/week: 0.0 standard drinks    Comment: occ  . Drug use: No     Family Hx: [name] family history includes Breast cancer in her daughter; Colon polyps in her father; Diabetes in her brother and sister; Heart attack in her father; Hypertension in her sister; Parkinsonism in her mother; Pneumonia in her father; Sarcoidosis in her brother. There is no history of Colon cancer, Esophageal cancer, Rectal cancer, or Stomach cancer.  ROS:   Please see the history of present illness.    General:no colds or fevers, no weight changes Skin:no rashes or ulcers HEENT:no blurred vision, no congestion CV:see HPI PUL:see HPI GI:no diarrhea constipation or melena, no indigestion GU:no hematuria, no dysuria MS:no joint pain, no claudication Neuro:no syncope, no lightheadedness Endo:+ diabetes, no thyroid disease  All other systems reviewed and are negative.   Prior CV studies:   The following studies were reviewed today:  nuc study 12/27/16 Study Highlights    The left ventricular ejection fraction is normal (55-65%).  Nuclear stress EF: 65%.  There was no ST segment deviation noted during stress.  No T wave inversion was noted during stress.  Defect 1: There is a small defect of moderate severity present in the basal anterior and mid anterior location. This was consistent with artifact.  The study is normal.  This is a low risk study.   Echo 11/28/11 Study Conclusions  Left ventricle: The cavity size was normal. Wall thickness was increased in a pattern of mild LVH. Systolic function was normal. The estimated ejection fraction was in the range of 55% to 60%. Wall motion was normal; there were no regional wall motion abnormalities  Labs/Other Tests and Data Reviewed:    EKG:  An ECG dated 09/10/17 was personally reviewed today and demonstrated:  SR biatrial  enlargement    Recent Labs: 02/06/2018: ALT 7; BUN 11; Creatinine, Ser 0.48; Hemoglobin 12.2; Platelets 435.0; Potassium 4.0; Sodium 141; TSH 0.77   Recent Lipid Panel Lab Results  Component Value Date/Time   CHOL 152 02/06/2018 10:03 AM   TRIG 117.0 02/06/2018 10:03 AM   HDL 58.40 02/06/2018 10:03 AM   CHOLHDL 3 02/06/2018 10:03 AM   LDLCALC 70 02/06/2018 10:03 AM    Wt Readings from Last 3 Encounters:  08/28/18 205 lb (93 kg)  02/06/18 215 lb (97.5 kg)  01/10/18 218 lb (98.9 kg)     Objective:    Vital Signs:  BP (!) 143/93   Pulse 66   Ht 5' (1.524 m)   Wt 205 lb (93 kg)   LMP  (LMP Unknown)   BMI 40.04 kg/m    VITAL SIGNS:  reviewed  Neuro Alert and oriented X 3 < MAE, follows commands General female in no acute distress Lungs: can speak in complete sentences without SOB or wheezes Ext.  1+ edema lt leg. Foot is worse that ankle.   ASSESSMENT & PLAN:    1. CAD with stent to prox LAD in 2002. Follow up caths have been stable, most recent 2013.  She does have angina at times and takes 1 NTG with relief.  She has been doing this for a long time and no changes.  Continue current meds 2. ASA intolerance on plavix 75 mg daily. 3. Chronic diastolic HF stable mild edema in Lt leg. Most likely due to venous insuff. 4.  HLD and last LDL 70 continue statin.  5. HTN, elevated today but all others were normal.  She does not believe her BP cuff is accurate    COVID-19 Education: The signs and symptoms of COVID-19 were discussed with the patient and how to seek care for testing (follow up with PCP or arrange E-visit).  The importance of social distancing was discussed today.  Time:   Today, I have spent 15 minutes with the patient with telehealth technology discussing the above problems.     Medication Adjustments/Labs and Tests Ordered: Current medicines are reviewed at length with the patient today.  Concerns regarding medicines are outlined above.   Tests Ordered: No  orders of the defined types were placed in this encounter.   Medication Changes: No orders of the defined types were placed in this encounter.   Disposition:  Follow up in 6 month(s)  Signed, Cecilie Kicks, NP  08/28/2018 9:15 PM    [facility]

## 2018-08-28 NOTE — Patient Instructions (Addendum)
Medication Instructions:  Your physician recommends that you continue on your current medications as directed. Please refer to the Current Medication list given to you today.  If you need a refill on your cardiac medications before your next appointment, please call your pharmacy.   Lab work: None ordered  If you have labs (blood work) drawn today and your tests are completely normal, you will receive your results only by: Marland Kitchen MyChart Message (if you have MyChart) OR . A paper copy in the mail If you have any lab test that is abnormal or we need to change your treatment, we will call you to review the results.  Testing/Procedures: None ordered   Follow-Up: At [facility], you and your health needs are our priority.  As part of our continuing mission to provide you with exceptional heart care, we have created designated Provider Care Teams.  These Care Teams include your primary Cardiologist (physician) and Advanced Practice Providers (APPs -  Physician Assistants and Nurse Practitioners) who all work together to provide you with the care you need, when you need it. You will need a follow up appointment in 6 months.  Please call our office 2 months in advance to schedule this appointment.  You may see Candee Furbish, MD or one of the following Advanced Practice Providers on your designated Care Team:   Truitt Merle, NP Cecilie Kicks, NP . Kathyrn Drown, NP  Any Other Special Instructions Will Be Listed Below (If Applicable). Just a reminder to get some support stockings

## 2018-08-30 ENCOUNTER — Other Ambulatory Visit: Payer: Self-pay | Admitting: Family Medicine

## 2018-09-02 ENCOUNTER — Other Ambulatory Visit: Payer: Self-pay | Admitting: Family Medicine

## 2018-09-10 DIAGNOSIS — Z961 Presence of intraocular lens: Secondary | ICD-10-CM | POA: Diagnosis not present

## 2018-09-10 DIAGNOSIS — H2511 Age-related nuclear cataract, right eye: Secondary | ICD-10-CM | POA: Diagnosis not present

## 2018-09-10 DIAGNOSIS — H5201 Hypermetropia, right eye: Secondary | ICD-10-CM | POA: Diagnosis not present

## 2018-09-11 DIAGNOSIS — H2512 Age-related nuclear cataract, left eye: Secondary | ICD-10-CM | POA: Diagnosis not present

## 2018-09-15 ENCOUNTER — Telehealth: Payer: Self-pay

## 2018-09-15 DIAGNOSIS — M48061 Spinal stenosis, lumbar region without neurogenic claudication: Secondary | ICD-10-CM | POA: Diagnosis not present

## 2018-09-15 NOTE — Telephone Encounter (Signed)
Dr. Marlou Porch Please comment on holding plavix. No recent PCI

## 2018-09-15 NOTE — Telephone Encounter (Signed)
[facility] Pre-operative Risk Assessment    Request for surgical clearance:  1. What type of surgery is being performed? Lumbar Epidural Steroid L 3-4 and L 4-5  2. When is this surgery scheduled? TBD  3. What type of clearance is required (medical clearance vs.              Pharmacy clearance to hold med vs. Both)? Both  4. Are there any medications that need to be held prior to surgery       and how long? Plavix hold for 7 days prior to the injection  5. Practice name and name of physician performing surgery?         [facility] - Dr. Ermalene Postin  6.  What is your office phone number [phone]   7.   What is your office fax number [phone]  8.   Anesthesia type (None, local, MAC, general) None noted   Maston Wight I Lauri Till 09/15/2018, 3:36 PM  _________________________________________________________________   (provider comments below)

## 2018-09-16 NOTE — Telephone Encounter (Signed)
She may hold her Plavix for 7 days prior to procedure and resume 2 days post procedure if okay with surgery. Candee Furbish, MD

## 2018-09-16 NOTE — Telephone Encounter (Signed)
Primary Cardiologist: Candee Furbish, MD  Chart reviewed as part of pre-operative protocol coverage. Patient was contacted 09/16/2018 in reference to pre-operative risk assessment for pending surgery as outlined below.  [name] was last seen on 08/28/18 by Cecilie Kicks NP.  Since that day, [name] has done well.  She denies any new or worsening cardiac symptoms. She can't complete 4.0 METS due to back pain. Her stress test in 2018 was negative for ischemia.   Per Dr. Marlou Porch: She may hold her Plavix for 7 days prior to procedure and resume 2 days post procedure if okay with surgery.    Therefore, based on ACC/AHA guidelines, the patient would be at acceptable risk for the planned procedure without further cardiovascular testing.   I will route this recommendation to the requesting party via Epic fax function and remove from pre-op pool.  Please call with questions.  [place], PA 09/16/2018, 10:06 AM

## 2018-09-16 NOTE — Telephone Encounter (Signed)
Follow up:    Patient retunring call back. Please call patient.

## 2018-09-24 DIAGNOSIS — H2512 Age-related nuclear cataract, left eye: Secondary | ICD-10-CM | POA: Diagnosis not present

## 2018-09-24 DIAGNOSIS — H524 Presbyopia: Secondary | ICD-10-CM | POA: Diagnosis not present

## 2018-09-24 DIAGNOSIS — H5203 Hypermetropia, bilateral: Secondary | ICD-10-CM | POA: Diagnosis not present

## 2018-09-24 DIAGNOSIS — Z961 Presence of intraocular lens: Secondary | ICD-10-CM | POA: Diagnosis not present

## 2018-10-05 ENCOUNTER — Other Ambulatory Visit: Payer: Self-pay | Admitting: Cardiology

## 2018-10-09 DIAGNOSIS — M545 Low back pain: Secondary | ICD-10-CM | POA: Diagnosis not present

## 2018-10-09 DIAGNOSIS — M48061 Spinal stenosis, lumbar region without neurogenic claudication: Secondary | ICD-10-CM | POA: Diagnosis not present

## 2018-10-09 DIAGNOSIS — Z6841 Body Mass Index (BMI) 40.0 and over, adult: Secondary | ICD-10-CM | POA: Diagnosis not present

## 2018-10-21 ENCOUNTER — Other Ambulatory Visit: Payer: Self-pay

## 2018-10-21 ENCOUNTER — Telehealth: Payer: Self-pay | Admitting: Family Medicine

## 2018-10-21 ENCOUNTER — Ambulatory Visit (INDEPENDENT_AMBULATORY_CARE_PROVIDER_SITE_OTHER): Payer: Medicare Other | Admitting: Family Medicine

## 2018-10-21 ENCOUNTER — Encounter: Payer: Self-pay | Admitting: Family Medicine

## 2018-10-21 DIAGNOSIS — M5442 Lumbago with sciatica, left side: Secondary | ICD-10-CM

## 2018-10-21 DIAGNOSIS — G8929 Other chronic pain: Secondary | ICD-10-CM

## 2018-10-21 DIAGNOSIS — M5441 Lumbago with sciatica, right side: Secondary | ICD-10-CM | POA: Diagnosis not present

## 2018-10-21 DIAGNOSIS — I2510 Atherosclerotic heart disease of native coronary artery without angina pectoris: Secondary | ICD-10-CM | POA: Diagnosis not present

## 2018-10-21 DIAGNOSIS — I2583 Coronary atherosclerosis due to lipid rich plaque: Secondary | ICD-10-CM

## 2018-10-21 DIAGNOSIS — M545 Low back pain, unspecified: Secondary | ICD-10-CM | POA: Insufficient documentation

## 2018-10-21 MED ORDER — HYDROCODONE-ACETAMINOPHEN 10-325 MG PO TABS: 1.0000 | ORAL_TABLET | ORAL | 0 refills | Status: DC | PRN

## 2018-10-21 NOTE — Progress Notes (Signed)
Subjective:    Patient ID: [name], female    DOB: 1940/05/22, 78 y.o.   MRN: [id number]  HPI Virtual Visit via Video Note  I connected with the patient on 10/21/18 at 11:15 AM EDT by a video enabled telemedicine application and verified that I am speaking with the correct person using two identifiers.  Location patient: home Location provider:work or home office Persons participating in the virtual visit: patient, provider  I discussed the limitations of evaluation and management by telemedicine and the availability of in person appointments. The patient expressed understanding and agreed to proceed.   HPI: Here to ask for refills on pain medication. She has been seeing Dr. Almedia Balls for several years for her low back pain. She has received several epidural steroid injections with her, the most recent being last week. Dr. Lynann Bologna has also been prescribing her pain medication, but she plans to retire within the next month. She has asked if we can take over prescribing these medications. [name] has been taking Norco 10 as neede, along with some occasional Prednisone.    ROS: See pertinent positives and negatives per HPI.  Past Medical History:  Diagnosis Date  . Adenomatous colon polyp 02/2006  . Allergy   . Allergy, unspecified not elsewhere classified   . Anemia   . Anxiety   . Arthritis   . Asthma   . Blood transfusion without reported diagnosis   . Bronchiectasis   . CAD (coronary artery disease)    BMS to the LAD, 2002; cath 12/07/11 patent LAD stent and mild nonobstructive disease, EF 65%; Medical management  . Cataract   . CHF (congestive heart failure) (HCC)    Diastolic  . Diabetes mellitus   . Diverticulosis of colon   . DJD (degenerative joint disease)   . Fibromyalgia   . GERD (gastroesophageal reflux disease)   . Hypercholesterolemia   . Hypertension   . Microscopic hematuria   . Neoplasm of kidney   . Obesity   . Other diseases of lung, not elsewhere  classified   . Pinched vertebral nerve   . Pulmonary nodule    Negative PET in 2010  . Stress incontinence, female   . Syncope   . Ulcerative colitis, left sided ([place]) 2008   Hx of  . UTI (lower urinary tract infection)   . Vitamin D deficiency disease     Past Surgical History:  Procedure Laterality Date  . ABDOMINAL HYSTERECTOMY    . CARDIAC CATHETERIZATION  11/2011  . CHOLECYSTECTOMY    . COLONOSCOPY    . COLONOSCOPY W/ BIOPSIES  11-12-12   per Dr. Fuller Plan, diverticulosis only, repeat in 5 yrs  . CORONARY STENT PLACEMENT  2002   BMS to the LAD  . ESOPHAGOGASTRODUODENOSCOPY  12/2008  . HAND SURGERY  01/2006   Right Hand Surgery by Dr. Amedeo Plenty  . LEFT HEART CATHETERIZATION WITH CORONARY ANGIOGRAM N/A 12/07/2011   Procedure: LEFT HEART CATHETERIZATION WITH CORONARY ANGIOGRAM;  Surgeon: Peter M Martinique, MD;  Location: [facility] CATH LAB;  Service: Cardiovascular;  Laterality: N/A;    Family History  Problem Relation Age of Onset  . Pneumonia Father   . Heart attack Father   . Colon polyps Father   . Parkinsonism Mother   . Diabetes Brother   . Sarcoidosis Brother   . Hypertension Sister   . Diabetes Sister   . Breast cancer Daughter   . Colon cancer Neg Hx   . Esophageal cancer Neg Hx   .  Rectal cancer Neg Hx   . Stomach cancer Neg Hx      Current Outpatient Medications:  .  ACCU-CHEK AVIVA PLUS test strip, TEST ONCE PER DAY AND DIAGNOSIS CODE IS E 11.9, Disp: 100 each, Rfl: 1 .  ACCU-CHEK FASTCLIX LANCETS MISC, Test once per day and diagnosis code is E 11.9, Disp: 102 each, Rfl: 1 .  acetaminophen (TYLENOL) 500 MG tablet, Take 500 mg by mouth every 6 (six) hours as needed for headache (pain). , Disp: , Rfl:  .  albuterol (PROVENTIL) (2.5 MG/3ML) 0.083% nebulizer solution, Take 3 mLs (2.5 mg total) by nebulization every 4 (four) hours as needed for wheezing or shortness of breath., Disp: 75 mL, Rfl: 3 .  albuterol (VENTOLIN HFA) 108 (90 BASE) MCG/ACT inhaler, Inhale 2 puffs into  the lungs every 4 (four) hours as needed for wheezing or shortness of breath., Disp: 18 each, Rfl: 11 .  ALPRAZolam (XANAX) 0.5 MG tablet, TAKE 1 TABLET BY MOUTH THREE TIMES A DAY AS NEEDED, Disp: 90 tablet, Rfl: 5 .  amLODipine (NORVASC) 10 MG tablet, TAKE 1 TABLET BY MOUTH EVERY DAY, Disp: 90 tablet, Rfl: 2 .  atenolol (TENORMIN) 50 MG tablet, TAKE 1 TABLET BY MOUTH EVERY DAY, Disp: 90 tablet, Rfl: 2 .  atorvastatin (LIPITOR) 10 MG tablet, TAKE 1 TABLET BY MOUTH EVERY DAY, Disp: 90 tablet, Rfl: 2 .  azelastine (ASTELIN) 0.1 % nasal spray, Place 1 spray into both nostrils at bedtime., Disp: , Rfl:  .  budesonide-formoterol (SYMBICORT) 160-4.5 MCG/ACT inhaler, Inhale 2 puffs into the lungs 2 (two) times daily., Disp: , Rfl:  .  Cholecalciferol (VITAMIN D-3) 5000 UNITS TABS, Take 5,000 Units by mouth daily.  , Disp: , Rfl:  .  clopidogrel (PLAVIX) 75 MG tablet, TAKE 1 TABLET BY MOUTH EVERY DAY, Disp: 90 tablet, Rfl: 2 .  clotrimazole (LOTRIMIN) 1 % external solution, Apply 1 application topically 2 (two) times daily. In between toes, Disp: 60 mL, Rfl: 5 .  DELZICOL 400 MG CPDR DR capsule, TAKE 4 CAPSULES (1,600 MG TOTAL) BY MOUTH 3 (THREE) TIMES DAILY., Disp: 1080 capsule, Rfl: 1 .  diclofenac sodium (VOLTAREN) 1 % GEL, As needed for pain, Disp: , Rfl:  .  fluticasone (FLONASE) 50 MCG/ACT nasal spray, PLACE 2 SPRAYS IN EACH NOSTRIL AT BEDTIME, Disp: 16 g, Rfl: 6 .  furosemide (LASIX) 40 MG tablet, Take 1 tablet (40 mg total) by mouth 2 (two) times daily. TAKE 1 TABLET (40 MG TOTAL) BY MOUTH 3 (THREE) TIMES DAILY., Disp: 180 tablet, Rfl: 2 .  glimepiride (AMARYL) 1 MG tablet, TAKE 1 TABLET BY MOUTH DAILY BEFORE BREAKFAST., Disp: 90 tablet, Rfl: 3 .  halobetasol (ULTRAVATE) 0.05 % cream, Please specify directions, refills and quantity, Disp: 1 g, Rfl: 0 .  HYDROcodone-homatropine (HYDROMET) 5-1.5 MG/5ML syrup, Take 5 mLs by mouth every 4 (four) hours as needed., Disp: 240 mL, Rfl: 0 .  hydrOXYzine  (ATARAX/VISTARIL) 25 MG tablet, TAKE 1 TABLET EVERY 4 HOURS AS NEEDED FOR ITCHING, Disp: 50 tablet, Rfl: 0 .  isosorbide mononitrate (IMDUR) 60 MG 24 hr tablet, Take 1 tablet (60 mg total) by mouth daily., Disp: 90 tablet, Rfl: 2 .  ketoconazole (NIZORAL) 2 % cream, Apply 1 application topically daily., Disp: 15 g, Rfl: 1 .  KLOR-CON M20 20 MEQ tablet, TAKE 1 TABLET BY MOUTH TWICE A DAY, Disp: 180 tablet, Rfl: 3 .  levocetirizine (XYZAL) 5 MG tablet, TAKE 1 TABLET BY MOUTH EVERY DAY IN THE  EVENING, Disp: 90 tablet, Rfl: 3 .  LIDODERM 5 %, APPLY 1 PATCH TO SKIN FOR 12 HOURS THEN REMOVE AND DISCARD PATCH WITHIN 72 HOURS OR AS DIRECTED, Disp: 30 patch, Rfl: 2 .  loperamide (IMODIUM) 2 MG capsule, Take 4-6 mg by mouth daily., Disp: , Rfl:  .  losartan (COZAAR) 50 MG tablet, Take 1 tablet (50 mg total) by mouth daily., Disp: 90 tablet, Rfl: 1 .  magnesium oxide (MAG-OX) 400 MG tablet, Take 1 tablet (400 mg total) by mouth daily., Disp: 90 tablet, Rfl: 3 .  metFORMIN (GLUCOPHAGE) 1000 MG tablet, Take 1 tablet (1,000 mg total) by mouth 2 (two) times daily with a meal., Disp: 180 tablet, Rfl: 2 .  methylPREDNISolone (MEDROL DOSEPAK) 4 MG TBPK tablet, Take as directed, Disp: 21 tablet, Rfl: 0 .  montelukast (SINGULAIR) 10 MG tablet, TAKE 1 TABLET BY MOUTH EVERYDAY AT BEDTIME, Disp: 90 tablet, Rfl: 0 .  nabumetone (RELAFEN) 750 MG tablet, TAKE 1 TABLET BY MOUTH TWICE A DAY WITH MEAL, Disp: 180 tablet, Rfl: 0 .  neomycin-polymyxin-hydrocortisone (CORTISPORIN) otic solution, Use 1 drop in affected ear three times daily as needed, Disp: 10 mL, Rfl: 0 .  nitroGLYCERIN (NITROSTAT) 0.4 MG SL tablet, PLACE 1 TABLET (0.4 MG TOTAL) UNDER THE TONGUE EVERY 5 (FIVE) MINUTES AS NEEDED FOR CHEST PAIN., Disp: 25 tablet, Rfl: 2 .  Olopatadine HCl (PAZEO) 0.7 % SOLN, Place 1 drop into both eyes daily as needed (itching/allergies)., Disp: , Rfl:  .  ondansetron (ZOFRAN) 4 MG tablet, Take 1 tablet (4 mg total) by mouth every 6  (six) hours as needed for nausea or vomiting., Disp: 40 tablet, Rfl: 0 .  pantoprazole (PROTONIX) 40 MG tablet, TAKE 1 TABLET BY MOUTH TWICE DAILY, Disp: 180 tablet, Rfl: 1 .  Polyethyl Glycol-Propyl Glycol (SYSTANE OP), Place 1 drop into both eyes daily. For dry eyes, Disp: , Rfl:  .  tiZANidine (ZANAFLEX) 4 MG tablet, TAKE 1 TABLET BY MOUTH TWICE A DAY, Disp: 90 tablet, Rfl: 1 .  HYDROcodone-acetaminophen (NORCO) 10-325 MG tablet, Take 1 tablet by mouth every 4 (four) hours as needed for severe pain., Disp: 30 tablet, Rfl: 0  Current Facility-Administered Medications:  .  ipratropium-albuterol (DUONEB) 0.5-2.5 (3) MG/3ML nebulizer solution 3 mL, 3 mL, Nebulization, Once, Nafziger, Tommi Rumps, NP  EXAM:  VITALS per patient if applicable:  GENERAL: alert, oriented, appears well and in no acute distress  HEENT: atraumatic, conjunttiva clear, no obvious abnormalities on inspection of external nose and ears  NECK: normal movements of the head and neck  LUNGS: on inspection no signs of respiratory distress, breathing rate appears normal, no obvious gross SOB, gasping or wheezing  CV: no obvious cyanosis  MS: moves all visible extremities without noticeable abnormality  PSYCH/NEURO: pleasant and cooperative, no obvious depression or anxiety, speech and thought processing grossly intact  ASSESSMENT AND PLAN: Low back pain. I agreed to prescribe her pain medications from now on. We will send in for #30 Norco today and we have scheduled her to come into out office next week for her first Pain Management visit.  We will begin the protocol at that time.  Alysia Penna, MD  Discussed the following assessment and plan:  Chronic bilateral low back pain with bilateral sciatica     I discussed the assessment and treatment plan with the patient. The patient was provided an opportunity to ask questions and all were answered. The patient agreed with the plan and demonstrated an understanding of the  instructions.   The patient was advised to call back or seek an in-person evaluation if the symptoms worsen or if the condition fails to improve as anticipated.     Review of Systems     Objective:   Physical Exam        Assessment & Plan:

## 2018-10-21 NOTE — Telephone Encounter (Signed)
Pt has a doxy visit today at 11:15.  Wanting something stronger than the norco.

## 2018-10-21 NOTE — Telephone Encounter (Signed)
Medication: HYDROcodone-acetaminophen (NORCO) 10-325 MG tablet - Pt may need something stronger she says  Has the patient contacted their pharmacy? Yes  (Agent: If no, request that the patient contact the pharmacy for the refill.) (Agent: If yes, when and what did the pharmacy advise?)  Preferred Pharmacy (with phone number or street name): [id number] [place] [place] [address]Phone: [phone]: [phone]   Agent: Please be advised that RX refills may take up to 3 business days. We ask that you follow-up with your pharmacy.

## 2018-10-28 ENCOUNTER — Other Ambulatory Visit: Payer: Self-pay

## 2018-10-28 ENCOUNTER — Ambulatory Visit (INDEPENDENT_AMBULATORY_CARE_PROVIDER_SITE_OTHER): Payer: Medicare Other | Admitting: Family Medicine

## 2018-10-28 ENCOUNTER — Encounter: Payer: Self-pay | Admitting: Family Medicine

## 2018-10-28 VITALS — BP 128/76 | HR 67 | Temp 98.6°F

## 2018-10-28 DIAGNOSIS — I2583 Coronary atherosclerosis due to lipid rich plaque: Secondary | ICD-10-CM | POA: Diagnosis not present

## 2018-10-28 DIAGNOSIS — M5442 Lumbago with sciatica, left side: Secondary | ICD-10-CM | POA: Diagnosis not present

## 2018-10-28 DIAGNOSIS — I2510 Atherosclerotic heart disease of native coronary artery without angina pectoris: Secondary | ICD-10-CM

## 2018-10-28 DIAGNOSIS — F1190 Opioid use, unspecified, uncomplicated: Secondary | ICD-10-CM | POA: Diagnosis not present

## 2018-10-28 DIAGNOSIS — G8929 Other chronic pain: Secondary | ICD-10-CM | POA: Diagnosis not present

## 2018-10-28 DIAGNOSIS — M5441 Lumbago with sciatica, right side: Secondary | ICD-10-CM | POA: Diagnosis not present

## 2018-10-28 MED ORDER — HYDROCODONE-ACETAMINOPHEN 10-325 MG PO TABS: 1.0000 | ORAL_TABLET | Freq: Four times a day (QID) | ORAL | 0 refills | Status: DC | PRN

## 2018-10-28 MED ORDER — HYDROCODONE-ACETAMINOPHEN 10-325 MG PO TABS: 1.0000 | ORAL_TABLET | Freq: Four times a day (QID) | ORAL | 0 refills | Status: AC | PRN

## 2018-10-28 MED ORDER — MONTELUKAST SODIUM 10 MG PO TABS: ORAL_TABLET | ORAL | 3 refills | Status: DC

## 2018-10-28 NOTE — Progress Notes (Signed)
Subjective:    Patient ID: [name], female    DOB: 11/14/1940, 78 y.o.   MRN: [id number]  HPI Here for pain management. She has chronic low back pain and she used to see Dr. Almedia Balls for pain management. Since she is retiring, we will assume this role. [name] has been using Norco 3 times daily for pain, but she has a lot of breakthrough pain. She uses a walker to get around. She can no longer drive due to the pain.  Indication for chronic opioid: low back pain Medication and dose: Norco 10-325 # pills per month: 120 Last UDS date: 10-28-18 Opioid Treatment Agreement signed (Y/N): 10-28-18 Opioid Treatment Agreement last reviewed with patient:  10-28-18 NCCSRS reviewed this encounter (include red flags):  10-28-18    Review of Systems  Constitutional: Negative.   Respiratory: Negative.   Cardiovascular: Negative.   Musculoskeletal: Positive for back pain.       Objective:   Physical Exam Constitutional:      Comments: In a wheelchair   Cardiovascular:     Rate and Rhythm: Normal rate and regular rhythm.     Pulses: Normal pulses.     Heart sounds: Normal heart sounds.  Pulmonary:     Effort: Pulmonary effort is normal.     Breath sounds: Normal breath sounds.  Neurological:     General: No focal deficit present.     Mental Status: She is alert and oriented to person, place, and time.           Assessment & Plan:  Pain management, we will increase the Norco dosing to 4 times a day as needed.  Alysia Penna, MD

## 2018-10-30 DIAGNOSIS — M25551 Pain in right hip: Secondary | ICD-10-CM | POA: Diagnosis not present

## 2018-10-30 DIAGNOSIS — M48061 Spinal stenosis, lumbar region without neurogenic claudication: Secondary | ICD-10-CM | POA: Diagnosis not present

## 2018-10-30 LAB — PAIN MGMT, PROFILE 8 W/CONF, U
6 Acetylmorphine: NEGATIVE ng/mL
Alcohol Metabolites: NEGATIVE ng/mL (ref <500)
Alphahydroxyalprazolam: 114 ng/mL
Alphahydroxymidazolam: NEGATIVE ng/mL
Alphahydroxytriazolam: NEGATIVE ng/mL
Aminoclonazepam: NEGATIVE ng/mL
Amphetamines: NEGATIVE ng/mL
Benzodiazepines: POSITIVE ng/mL
Buprenorphine, Urine: NEGATIVE ng/mL
Cocaine Metabolite: NEGATIVE ng/mL
Codeine: NEGATIVE ng/mL
Creatinine: 34.2 mg/dL
Hydrocodone: 1674 ng/mL
Hydromorphone: 809 ng/mL
Hydroxyethylflurazepam: NEGATIVE ng/mL
Lorazepam: NEGATIVE ng/mL
MDMA: NEGATIVE ng/mL
Marijuana Metabolite: NEGATIVE ng/mL
Morphine: NEGATIVE ng/mL
Nordiazepam: NEGATIVE ng/mL
Norhydrocodone: 902 ng/mL
Opiates: POSITIVE ng/mL
Oxazepam: NEGATIVE ng/mL
Oxidant: NEGATIVE ug/mL
Oxycodone: NEGATIVE ng/mL
Temazepam: NEGATIVE ng/mL
pH: 4.9 (ref 4.5–9.0)

## 2018-11-06 ENCOUNTER — Other Ambulatory Visit: Payer: Self-pay | Admitting: Cardiology

## 2018-11-23 ENCOUNTER — Other Ambulatory Visit: Payer: Self-pay | Admitting: Family Medicine

## 2018-11-24 ENCOUNTER — Other Ambulatory Visit: Payer: Self-pay | Admitting: Cardiology

## 2018-11-28 ENCOUNTER — Other Ambulatory Visit: Payer: Self-pay | Admitting: Cardiology

## 2018-12-15 ENCOUNTER — Telehealth: Payer: Self-pay | Admitting: Gastroenterology

## 2018-12-15 ENCOUNTER — Other Ambulatory Visit (INDEPENDENT_AMBULATORY_CARE_PROVIDER_SITE_OTHER): Payer: Medicare Other

## 2018-12-15 DIAGNOSIS — K51919 Ulcerative colitis, unspecified with unspecified complications: Secondary | ICD-10-CM | POA: Diagnosis not present

## 2018-12-15 DIAGNOSIS — R197 Diarrhea, unspecified: Secondary | ICD-10-CM | POA: Diagnosis not present

## 2018-12-15 LAB — CBC WITH DIFFERENTIAL/PLATELET
Basophils Absolute: 0.1 10*3/uL (ref 0.0–0.1)
Basophils Relative: 0.6 % (ref 0.0–3.0)
Eosinophils Absolute: 0.6 10*3/uL (ref 0.0–0.7)
Eosinophils Relative: 5.7 % — ABNORMAL HIGH (ref 0.0–5.0)
HCT: 38.5 % (ref 36.0–46.0)
Hemoglobin: 12.5 g/dL (ref 12.0–15.0)
Lymphocytes Relative: 16.4 % (ref 12.0–46.0)
Lymphs Abs: 1.8 10*3/uL (ref 0.7–4.0)
MCHC: 32.6 g/dL (ref 30.0–36.0)
MCV: 93.1 fl (ref 78.0–100.0)
Monocytes Absolute: 0.9 10*3/uL (ref 0.1–1.0)
Monocytes Relative: 7.9 % (ref 3.0–12.0)
Neutro Abs: 7.8 10*3/uL — ABNORMAL HIGH (ref 1.4–7.7)
Neutrophils Relative %: 69.4 % (ref 43.0–77.0)
Platelets: 465 10*3/uL — ABNORMAL HIGH (ref 150.0–400.0)
RBC: 4.13 Mil/uL (ref 3.87–5.11)
RDW: 14.7 % (ref 11.5–15.5)
WBC: 11.2 10*3/uL — ABNORMAL HIGH (ref 4.0–10.5)

## 2018-12-15 LAB — COMPREHENSIVE METABOLIC PANEL
ALT: 5 U/L (ref 0–35)
AST: 11 U/L (ref 0–37)
Albumin: 4.6 g/dL (ref 3.5–5.2)
Alkaline Phosphatase: 54 U/L (ref 39–117)
BUN: 10 mg/dL (ref 6–23)
CO2: 26 mEq/L (ref 19–32)
Calcium: 9.8 mg/dL (ref 8.4–10.5)
Chloride: 103 mEq/L (ref 96–112)
Creatinine, Ser: 0.58 mg/dL (ref 0.40–1.20)
GFR: 122.17 mL/min (ref >60.00)
Glucose, Bld: 119 mg/dL — ABNORMAL HIGH (ref 70–99)
Potassium: 3.7 mEq/L (ref 3.5–5.1)
Sodium: 140 mEq/L (ref 135–145)
Total Bilirubin: 0.4 mg/dL (ref 0.2–1.2)
Total Protein: 7.7 g/dL (ref 6.0–8.3)

## 2018-12-15 LAB — TSH: TSH: 0.96 u[IU]/mL (ref 0.35–4.50)

## 2018-12-15 LAB — SEDIMENTATION RATE: Sed Rate: 33 mm/hr — ABNORMAL HIGH (ref 0–30)

## 2018-12-15 LAB — C-REACTIVE PROTEIN: CRP: <1 mg/dL (ref 0.5–20.0)

## 2018-12-15 NOTE — Telephone Encounter (Signed)
Patient with a hx of ulcerative rectosigmoiditis and is maintained on delzicol 400 mg TID.  She reports a 2 week Hx of urgency and diarrhea. No recent travel or sick contacts. She is using imodium PRN and having to use depends.  She has a follow up with you the end of September.  She has been doing well until the last 2 weeks.  Dr. Fuller Plan no APP or openings with you in the near future.  Please advise

## 2018-12-15 NOTE — Telephone Encounter (Signed)
Pt reported that she is having a UC flare up.  Please advise.

## 2018-12-15 NOTE — Telephone Encounter (Signed)
Patient notified she will come for labs ASAP today

## 2018-12-15 NOTE — Telephone Encounter (Signed)
CBC, CMP, TSH, ESR, CRP GI pathogen panel

## 2018-12-16 ENCOUNTER — Other Ambulatory Visit: Payer: Medicare Other

## 2018-12-16 DIAGNOSIS — R197 Diarrhea, unspecified: Secondary | ICD-10-CM | POA: Diagnosis not present

## 2018-12-16 DIAGNOSIS — K51919 Ulcerative colitis, unspecified with unspecified complications: Secondary | ICD-10-CM

## 2018-12-19 LAB — GASTROINTESTINAL PATHOGEN PANEL PCR
C. difficile Tox A/B, PCR: NOT DETECTED
Campylobacter, PCR: NOT DETECTED
Cryptosporidium, PCR: NOT DETECTED
E coli (ETEC) LT/ST PCR: NOT DETECTED
E coli (STEC) stx1/stx2, PCR: NOT DETECTED
E coli 0157, PCR: NOT DETECTED
Giardia lamblia, PCR: NOT DETECTED
Norovirus, PCR: NOT DETECTED
Rotavirus A, PCR: NOT DETECTED
Salmonella, PCR: NOT DETECTED
Shigella, PCR: NOT DETECTED

## 2018-12-22 ENCOUNTER — Other Ambulatory Visit: Payer: Self-pay

## 2018-12-22 MED ORDER — MESALAMINE 4 G RE ENEM: ENEMA | RECTAL | 0 refills | Status: DC

## 2019-01-02 ENCOUNTER — Other Ambulatory Visit: Payer: Self-pay

## 2019-01-02 ENCOUNTER — Telehealth: Payer: Self-pay | Admitting: Gastroenterology

## 2019-01-02 MED ORDER — GLYCOPYRROLATE 1 MG PO TABS: 1.0000 mg | ORAL_TABLET | Freq: Two times a day (BID) | ORAL | 0 refills | Status: DC

## 2019-01-02 MED ORDER — ONDANSETRON 4 MG PO TBDP: 4.0000 mg | ORAL_TABLET | Freq: Three times a day (TID) | ORAL | 0 refills | Status: DC | PRN

## 2019-01-02 MED ORDER — MESALAMINE ER 500 MG PO CPCR: 500.0000 mg | ORAL_CAPSULE | Freq: Three times a day (TID) | ORAL | 0 refills | Status: DC

## 2019-01-02 NOTE — Telephone Encounter (Signed)
Pt reported that she has not been able to eat for two weeks.

## 2019-01-02 NOTE — Telephone Encounter (Signed)
The pt is complaining of nausea with loss of appetite.  She can not even stand the smell of food.  She is drinking water and keeping it down.  She is used rowasa as directed but is now out. She states they did not help either.   She has watery stools as soon as she eats and is up all night with diarrhea.  She is taking 3 imodium with every bowel movement with no relief.  Had GI path panel 8/18 and it was negative.  She is getting weak and needs something for nausea and diarrhea.  Dr Lyndel Safe please advise you are DOD.

## 2019-01-02 NOTE — Telephone Encounter (Signed)
Patient is advised and expresses understanding of the plan.

## 2019-01-02 NOTE — Telephone Encounter (Signed)
DOD  - Had neg stool studies - Labs showed elevated sed rate, mildly elevated WBC count which is her baseline, mildly elevated platelets.  Normal CMP. - Colonoscopy 12/2027 - neg for active colitis  Plan: -She did not respond previously to dicyclomine.  Lets try glycopyrrolate 1 mg p.o. twice daily -She needs to be on Protonix 40 mg p.o. twice daily -Can we restart mesalamine (generic) 500 mg p.o. 3 times daily for now -Can we please work her in the APP clinic next week if she is not better.  May need CT Abdo/pelvis -For nausea, zofran 64m ODT Q6-8 hrs prn #30.  RG

## 2019-01-09 ENCOUNTER — Telehealth: Payer: Self-pay | Admitting: Family Medicine

## 2019-01-09 ENCOUNTER — Telehealth: Payer: Self-pay

## 2019-01-09 NOTE — Telephone Encounter (Signed)
RX REFILL HYDROcodone-acetaminophen [facility]) 10-325 MG  [id number] [phone]Phone) [phone](Fax)   Patient has no medication left.

## 2019-01-09 NOTE — Telephone Encounter (Signed)
On 9-4 Dr. Lyndel Safe requested for patient to take mesalamine 530m 3 times a day. Insurance rejected request and [facility]. Insurance company said that they will only approve mesalamine 4054mcapsule or 1.2g tablet as the generic and not name brand. Patient said that she does have some of the mesalamine that should hold her until Monday. She also mentioned that the Robinul is helping a little bit. Please advise how to correctly write rx for mesalamine for patient to have.

## 2019-01-09 NOTE — Telephone Encounter (Signed)
Last filled 12/28/2018 Qty:120   Patient is aware and will contact her pharmacy.

## 2019-01-12 MED ORDER — MESALAMINE 1.2 G PO TBEC: 1.2000 g | DELAYED_RELEASE_TABLET | Freq: Three times a day (TID) | ORAL | 1 refills | Status: DC

## 2019-01-12 MED ORDER — GLYCOPYRROLATE 1 MG PO TABS: 1.0000 mg | ORAL_TABLET | Freq: Two times a day (BID) | ORAL | 5 refills | Status: DC

## 2019-01-12 NOTE — Telephone Encounter (Signed)
Informed patient that we are sending in mesalamine 1.2 g tid to her pharmacy. Patient also requests a refill of her glycopyrrolate. Prescriptions sent to patient's pharmacy.

## 2019-01-12 NOTE — Telephone Encounter (Signed)
Pt previously taking Delzicol 1.6g po tid Can change to mesalamine 1.2g po tid

## 2019-01-19 ENCOUNTER — Telehealth: Payer: Self-pay | Admitting: Gastroenterology

## 2019-01-19 ENCOUNTER — Other Ambulatory Visit: Payer: Self-pay | Admitting: Cardiology

## 2019-01-19 ENCOUNTER — Other Ambulatory Visit: Payer: Self-pay | Admitting: Family Medicine

## 2019-01-19 NOTE — Telephone Encounter (Signed)
Pt having issues with UC, states the medicines are not helping and she has an appt for next week but wants to know if she can be seen Tues or Thurs. Pt scheduled to see Tye Savoy NP Thursday at 2pm. Pt aware of appt.

## 2019-01-21 ENCOUNTER — Other Ambulatory Visit: Payer: Self-pay | Admitting: Family Medicine

## 2019-01-22 ENCOUNTER — Ambulatory Visit (INDEPENDENT_AMBULATORY_CARE_PROVIDER_SITE_OTHER): Payer: Medicare Other | Admitting: Nurse Practitioner

## 2019-01-22 ENCOUNTER — Encounter: Payer: Self-pay | Admitting: Nurse Practitioner

## 2019-01-22 ENCOUNTER — Other Ambulatory Visit: Payer: Medicare Other

## 2019-01-22 VITALS — BP 112/62 | HR 71 | Temp 98.2°F | Ht 60.0 in | Wt 198.0 lb

## 2019-01-22 DIAGNOSIS — I2583 Coronary atherosclerosis due to lipid rich plaque: Secondary | ICD-10-CM

## 2019-01-22 DIAGNOSIS — I2510 Atherosclerotic heart disease of native coronary artery without angina pectoris: Secondary | ICD-10-CM | POA: Diagnosis not present

## 2019-01-22 DIAGNOSIS — R1010 Upper abdominal pain, unspecified: Secondary | ICD-10-CM | POA: Diagnosis not present

## 2019-01-22 DIAGNOSIS — K51219 Ulcerative (chronic) proctitis with unspecified complications: Secondary | ICD-10-CM | POA: Diagnosis not present

## 2019-01-22 DIAGNOSIS — R197 Diarrhea, unspecified: Secondary | ICD-10-CM

## 2019-01-22 DIAGNOSIS — R634 Abnormal weight loss: Secondary | ICD-10-CM | POA: Diagnosis not present

## 2019-01-22 MED ORDER — MESALAMINE 1.2 G PO TBEC: 1.2000 g | DELAYED_RELEASE_TABLET | Freq: Four times a day (QID) | ORAL | 2 refills | Status: DC

## 2019-01-22 MED ORDER — DIPHENOXYLATE-ATROPINE 2.5-0.025 MG PO TABS: 1.0000 | ORAL_TABLET | Freq: Three times a day (TID) | ORAL | 2 refills | Status: DC | PRN

## 2019-01-22 NOTE — Progress Notes (Signed)
Chief Complaint:    Severe diarrhea and abdominal pain  IMPRESSION and PLAN:    35.  78 year old female with longstanding left-sided ulcerative colitis, maintained on mesalamine without active disease on last colonoscopy September 2019.  Now with severe non-bloody diarrhea over last several weeks.  Stool studies negative, minimal elevation in ESR.  Ulcerative colitis could have come out of remission.  Other considerations include severe IBS, microscopic colitis, Metformin induced, pancreatic insufficiency secondary to diabetes, SIBO.  -First will maximize mesalamine, she is only on 3.6 g daily.  Will increase to 4.8 g daily -Lomotil 3 times daily. It caused sleepiness in the past but she is maximizing imodium  Can continue Imodium if she has breakthrough diarrhea -Check fecal elastase.  -If diarrhea does not improve with these measures patient will call her PCP and request a 5-7 holiday from Metformin. If still no improvement then we will likely need to repeat her colonoscopy  2. Weight loss. Most of the loss was between Sept 2019 and April 2020 ( 13 pounds). Since then she has lost another 7 pounds.  3. Chronic upper abdominal pain.  Apparently dicyclomine was not covered by insurance in previous times.  Glycopyrrolate offers some relief.  -If worsening pain and /or continues to loose weight un expectantly consider EGD and /or CT angiogram  4. Hx of colon polyps, TA and sessile serrated on last colonoscopy in 2019.    HPI:     Patient is a 78 year old female with a history of IBS and left-sided ulcerative colitis followed by Dr. Fuller Plan.  She has been maintained on Mesalamine 1.2 grams TID and had no active disease at time of last colonoscopy Sept 2019.  She went called the office mid August, she has having flare symptoms with pain and urgent diarrhea despite imodium.  We ordered extensive labs and a GI pathogen panel.  Stool studies were negative, labs remarkable for mildly elevated  white count of 11.2, ESR mildly elevated at 33.  Her tsh was normal.  We started her on Rowasa enemas . She called the office again on 01/02/2019 with complaints of nausea, poor appetite, ongoing diarrhea.  We called in glycopyrrolate, twice daily Protonix, and Zofran.  Patient called the office a couple of days ago with ongoing symptoms, she is  here with grandson for evaluation.   [name] says she is having multiple loose bowel movements a day.  It hard for her to leave home.  She has chronic postprandial epigastric pain , it has gotten much worse lately Glycopyrrolate helps but doesn't alleviate the pain. Scared to eat anything due to upper abdominal pain and diarrhea. She is taking several doses of Lomotil every day.  No fevers.  Other than what we have given her she has not started any new medications lately.  She has been on metformin for years.   Data Reviewed:  Colonoscopy 01/09/18 Three 6 to 8 mm polyps in the descending colon, in the ascending colon and at the ileocecal valve, removed with a cold snare. Resected and retrieved. - Medium-sized lipoma in the transverse colon and in the ascending colon. - Diverticulosis in the left colon. - The examination was otherwise normal on direct and retroflexion views.  Surgical [P], descending, ascending and ileocecal valve, polyp (3) - TUBULAR ADENOMA (X2 FRAGMENTS). - SESSILE SERRATED POLYP WITHOUT DYSPLASIA (X2 FRAGMENTS). - NO HIGH GRADE DYSPLASIA OR MALIGNANCY. 2. Surgical [P], random sites right colon - BENIGN COLONIC MUCOSA. - NO ACTIVE INFLAMMATION. -  NO DYSPLASIA OR MALIGNANCY. 3. Surgical [P], random sites left colon - BENIGN COLONIC MUCOSA. - NO ACTIVE INFLAMMATION. - NO DYSPLASIA OR MALIGNANCY.  Review of systems:     No chest pain, no SOB, no fevers, no urinary sx    Past Medical History:  Diagnosis Date  . Adenomatous colon polyp 02/2006  . Allergy   . Allergy, unspecified not elsewhere classified   . Anemia   . Anxiety   .  Arthritis   . Asthma   . Blood transfusion without reported diagnosis   . Bronchiectasis   . CAD (coronary artery disease)    BMS to the LAD, 2002; cath 12/07/11 patent LAD stent and mild nonobstructive disease, EF 65%; Medical management  . Cataract   . CHF (congestive heart failure) (HCC)    Diastolic  . Diabetes mellitus   . Diverticulosis of colon   . DJD (degenerative joint disease)   . Fibromyalgia   . GERD (gastroesophageal reflux disease)   . Hypercholesterolemia   . Hypertension   . Microscopic hematuria   . Neoplasm of kidney   . Obesity   . Other diseases of lung, not elsewhere classified   . Pinched vertebral nerve   . Pulmonary nodule    Negative PET in 2010  . Stress incontinence, female   . Syncope   . Ulcerative colitis, left sided ([place]) 2008   Hx of  . UTI (lower urinary tract infection)   . Vitamin D deficiency disease     Patient's surgical history, family medical history, social history, medications and allergies were all reviewed in Epic   Creatinine clearance cannot be calculated (Patient's most recent lab result is older than the maximum 21 days allowed.)  Current Outpatient Medications  Medication Sig Dispense Refill  . ACCU-CHEK AVIVA PLUS test strip TEST ONCE PER DAY AND DIAGNOSIS CODE IS E 11.9 100 each 1  . ACCU-CHEK FASTCLIX LANCETS MISC Test once per day and diagnosis code is E 11.9 102 each 1  . acetaminophen (TYLENOL) 500 MG tablet Take 500 mg by mouth every 6 (six) hours as needed for headache (pain).     Marland Kitchen albuterol (PROVENTIL) (2.5 MG/3ML) 0.083% nebulizer solution Take 3 mLs (2.5 mg total) by nebulization every 4 (four) hours as needed for wheezing or shortness of breath. 75 mL 3  . albuterol (VENTOLIN HFA) 108 (90 BASE) MCG/ACT inhaler Inhale 2 puffs into the lungs every 4 (four) hours as needed for wheezing or shortness of breath. 18 each 11  . ALPRAZolam (XANAX) 0.5 MG tablet TAKE 1 TABLET BY MOUTH THREE TIMES A DAY AS NEEDED 90 tablet 5   . amLODipine (NORVASC) 10 MG tablet TAKE 1 TABLET BY MOUTH EVERY DAY 90 tablet 2  . atenolol (TENORMIN) 50 MG tablet TAKE 1 TABLET BY MOUTH EVERY DAY 90 tablet 2  . atorvastatin (LIPITOR) 10 MG tablet TAKE 1 TABLET BY MOUTH EVERY DAY 90 tablet 2  . azelastine (ASTELIN) 0.1 % nasal spray Place 1 spray into both nostrils at bedtime.    . budesonide-formoterol (SYMBICORT) 160-4.5 MCG/ACT inhaler Inhale 2 puffs into the lungs 2 (two) times daily.    . Cholecalciferol (VITAMIN D-3) 5000 UNITS TABS Take 5,000 Units by mouth daily.      . clopidogrel (PLAVIX) 75 MG tablet TAKE 1 TABLET BY MOUTH EVERY DAY 90 tablet 2  . clotrimazole (LOTRIMIN) 1 % external solution Apply 1 application topically 2 (two) times daily. In between toes 60 mL 5  . diclofenac  sodium (VOLTAREN) 1 % GEL As needed for pain    . fluticasone (FLONASE) 50 MCG/ACT nasal spray PLACE 2 SPRAYS IN EACH NOSTRIL AT BEDTIME 16 g 6  . furosemide (LASIX) 40 MG tablet TAKE 1 TABLET (40 MG TOTAL) BY MOUTH 2 (TWO) TIMES DAILY. 180 tablet 0  . glimepiride (AMARYL) 1 MG tablet TAKE 1 TABLET BY MOUTH DAILY BEFORE BREAKFAST. 90 tablet 3  . glycopyrrolate (ROBINUL) 1 MG tablet Take 1 tablet (1 mg total) by mouth 2 (two) times daily. 60 tablet 5  . HYDROcodone-acetaminophen (NORCO) 10-325 MG tablet Take 1 tablet by mouth every 6 (six) hours as needed for severe pain. 120 tablet 0  . HYDROcodone-homatropine (HYDROMET) 5-1.5 MG/5ML syrup Take 5 mLs by mouth every 4 (four) hours as needed. 240 mL 0  . hydrOXYzine (ATARAX/VISTARIL) 25 MG tablet TAKE 1 TABLET EVERY 4 HOURS AS NEEDED FOR ITCHING 50 tablet 0  . isosorbide mononitrate (IMDUR) 60 MG 24 hr tablet TAKE 1 TABLET BY MOUTH EVERY DAY 90 tablet 2  . ketoconazole (NIZORAL) 2 % cream Apply 1 application topically daily. 15 g 1  . KLOR-CON M20 20 MEQ tablet TAKE 1 TABLET BY MOUTH TWICE A DAY 180 tablet 3  . levocetirizine (XYZAL) 5 MG tablet TAKE 1 TABLET BY MOUTH EVERY DAY IN THE EVENING 90 tablet 3  .  LIDODERM 5 % APPLY 1 PATCH TO SKIN FOR 12 HOURS THEN REMOVE AND DISCARD PATCH WITHIN 72 HOURS OR AS DIRECTED 30 patch 2  . loperamide (IMODIUM) 2 MG capsule Take 4-6 mg by mouth daily.    Marland Kitchen losartan (COZAAR) 25 MG tablet TAKE 2 TABLETS (50MG) BY MOUTH EVERY DAY 180 tablet 0  . losartan (COZAAR) 50 MG tablet Take 1 tablet (50 mg total) by mouth daily. 90 tablet 1  . magnesium oxide (MAG-OX) 400 MG tablet TAKE 1 TABLET BY MOUTH EVERY DAY 90 tablet 3  . mesalamine (LIALDA) 1.2 g EC tablet Take 1 tablet (1.2 g total) by mouth 3 (three) times daily. 90 tablet 1  . metFORMIN (GLUCOPHAGE) 1000 MG tablet TAKE 1 TABLET (1,000 MG TOTAL) BY MOUTH 2 (TWO) TIMES DAILY WITH A MEAL. 180 tablet 2  . methylPREDNISolone (MEDROL DOSEPAK) 4 MG TBPK tablet Take as directed 21 tablet 0  . montelukast (SINGULAIR) 10 MG tablet Take one every morning 90 tablet 3  . nabumetone (RELAFEN) 750 MG tablet TAKE 1 TABLET BY MOUTH TWICE A DAY WITH MEAL 180 tablet 0  . neomycin-polymyxin-hydrocortisone (CORTISPORIN) otic solution Use 1 drop in affected ear three times daily as needed 10 mL 0  . nitroGLYCERIN (NITROSTAT) 0.4 MG SL tablet PLACE 1 TABLET (0.4 MG TOTAL) UNDER THE TONGUE EVERY 5 (FIVE) MINUTES AS NEEDED FOR CHEST PAIN. 25 tablet 5  . Olopatadine HCl (PAZEO) 0.7 % SOLN Place 1 drop into both eyes daily as needed (itching/allergies).    . ondansetron (ZOFRAN ODT) 4 MG disintegrating tablet Take 1 tablet (4 mg total) by mouth every 8 (eight) hours as needed for nausea or vomiting. 20 tablet 0  . ondansetron (ZOFRAN) 4 MG tablet Take 1 tablet (4 mg total) by mouth every 6 (six) hours as needed for nausea or vomiting. 40 tablet 0  . pantoprazole (PROTONIX) 40 MG tablet TAKE 1 TABLET BY MOUTH TWICE DAILY 180 tablet 1  . Polyethyl Glycol-Propyl Glycol (SYSTANE OP) Place 1 drop into both eyes daily. For dry eyes    . tiZANidine (ZANAFLEX) 4 MG tablet TAKE 1 TABLET BY MOUTH  TWICE A DAY 90 tablet 1   Current Facility-Administered  Medications  Medication Dose Route Frequency Provider Last Rate Last Dose  . ipratropium-albuterol (DUONEB) 0.5-2.5 (3) MG/3ML nebulizer solution 3 mL  3 mL Nebulization Once Dorothyann Peng, NP        Physical Exam:     BP 112/62 (BP Location: Left Arm, Patient Position: Sitting, Cuff Size: Normal)   Pulse 71   Temp 98.2 F (36.8 C) (Oral)   Ht 5' (1.524 m)   Wt 198 lb (89.8 kg)   LMP  (LMP Unknown)   BMI 38.67 kg/m   GENERAL:  Pleasant female in NAD PSYCH: : Cooperative, normal affect EENT:  conjunctiva pink, mucous membranes moist, neck supple without masses CARDIAC:  RRR,  no peripheral edema PULM: Normal respiratory effort, lungs CTA bilaterally, no wheezing ABDOMEN:  Nondistended, soft, nontender. No obvious masses,   normal bowel sounds SKIN:  turgor, no lesions seen Musculoskeletal:  Normal muscle tone, normal strength NEURO: Alert and oriented x 3, no focal neurologic deficits   [name] , NP 01/22/2019, 2:19 PM

## 2019-01-22 NOTE — Patient Instructions (Signed)
If you are age 76 or older, your body mass index should be between 23-30. Your Body mass index is 38.67 kg/m. If this is out of the aforementioned range listed, please consider follow up with your Primary Care Provider.  If you are age 61 or younger, your body mass index should be between 19-25. Your Body mass index is 38.67 kg/m. If this is out of the aformentioned range listed, please consider follow up with your Primary Care Provider.   We have sent the following medications to your pharmacy for you to pick up at your convenience: Lomotil Mesalamine  Your provider has requested that you go to the basement level for lab work before leaving today. Press "B" on the elevator. The lab is located at the first door on the left as you exit the elevator.  Call us in two weeks with an update.  Thank you for choosing me and [facility].   Tye Savoy, NP

## 2019-01-23 ENCOUNTER — Encounter: Payer: Self-pay | Admitting: Nurse Practitioner

## 2019-01-23 ENCOUNTER — Other Ambulatory Visit: Payer: Medicare Other

## 2019-01-23 DIAGNOSIS — R197 Diarrhea, unspecified: Secondary | ICD-10-CM

## 2019-01-23 DIAGNOSIS — K51219 Ulcerative (chronic) proctitis with unspecified complications: Secondary | ICD-10-CM | POA: Diagnosis not present

## 2019-01-24 NOTE — Progress Notes (Signed)
Reviewed and agree with management plan.  Pricilla Riffle. Fuller Plan, MD [facility]

## 2019-01-27 DIAGNOSIS — M48061 Spinal stenosis, lumbar region without neurogenic claudication: Secondary | ICD-10-CM | POA: Diagnosis not present

## 2019-01-28 ENCOUNTER — Ambulatory Visit: Payer: Medicare Other | Admitting: Gastroenterology

## 2019-02-03 LAB — PANCREATIC ELASTASE, FECAL: Pancreatic Elastase-1, Stool: 177 mcg/g — ABNORMAL LOW

## 2019-02-04 ENCOUNTER — Other Ambulatory Visit: Payer: Self-pay | Admitting: Gastroenterology

## 2019-02-05 ENCOUNTER — Other Ambulatory Visit: Payer: Self-pay

## 2019-02-06 ENCOUNTER — Other Ambulatory Visit: Payer: Self-pay | Admitting: Cardiology

## 2019-02-09 ENCOUNTER — Telehealth: Payer: Self-pay | Admitting: Gastroenterology

## 2019-02-09 NOTE — Telephone Encounter (Signed)
Pt called and states she took Creon on Friday and she started having pain in her back that went around to her ribs to her chest. Pt states she cannot take this medication, reports it made her feel terrible. She wanted to let Home know.

## 2019-02-09 NOTE — Telephone Encounter (Signed)
ERROR

## 2019-02-13 NOTE — Telephone Encounter (Signed)
Left message on machine to call back  

## 2019-02-13 NOTE — Telephone Encounter (Signed)
Ok. She can try Zenpep if we have samples but may cause same symptoms.

## 2019-02-16 NOTE — Telephone Encounter (Signed)
Spoke with the patient. She states her pain was in her back into her chest. It last for 24 hours. She considered going to the ED, but instead stopped Creon. Went to bed and "prayed for relief."She feels better as far as the pain she experienced. She is willing to try the Zenpep. Please advise on her dosing.  We have some samples.

## 2019-02-16 NOTE — Telephone Encounter (Signed)
Pt returned call

## 2019-02-17 DIAGNOSIS — J309 Allergic rhinitis, unspecified: Secondary | ICD-10-CM | POA: Diagnosis not present

## 2019-02-17 DIAGNOSIS — K219 Gastro-esophageal reflux disease without esophagitis: Secondary | ICD-10-CM | POA: Diagnosis not present

## 2019-02-17 DIAGNOSIS — J4540 Moderate persistent asthma, uncomplicated: Secondary | ICD-10-CM | POA: Diagnosis not present

## 2019-02-18 NOTE — Telephone Encounter (Signed)
I don't use Zenpep often however after reading and using dose calculator let's start with  Zenpep 10,000 U lipase PO with meals and 5,000U lipase PO with snacks Maintain approximately a 100g fat intake per day

## 2019-02-18 NOTE — Telephone Encounter (Signed)
Beth, I am including Dr. Fuller Plan on this. I have never used Zenpep and I would like to see what dose he suggests. Thanks

## 2019-02-19 ENCOUNTER — Telehealth: Payer: Self-pay | Admitting: Cardiology

## 2019-02-19 ENCOUNTER — Telehealth: Payer: Self-pay | Admitting: Gastroenterology

## 2019-02-19 ENCOUNTER — Other Ambulatory Visit: Payer: Self-pay | Admitting: Cardiology

## 2019-02-19 ENCOUNTER — Other Ambulatory Visit: Payer: Self-pay

## 2019-02-19 MED ORDER — ZENPEP 5000-24000 UNITS PO CPEP: 2.0000 | ORAL_CAPSULE | Freq: Three times a day (TID) | ORAL | 1 refills | Status: AC

## 2019-02-19 NOTE — Telephone Encounter (Signed)
I spoke with pt. She reports she uses a rolling walker and has much difficulty getting around recently.  Needs assistance.  She is aware both she and her grandson need to wear masks the entire time they are in the building.

## 2019-02-19 NOTE — Telephone Encounter (Signed)
Has been waiting to hear back regarding what medication she needs to take. Is not taking anything right now and the pain seem sto be worse.

## 2019-02-19 NOTE — Telephone Encounter (Signed)
Discussed Zenpep. She has not heard from the [facility]. [facility]. The Zenpep is covered by her insurance. Pharmacy is ordering it and will have it tomorrow.  Called the patient again and shared this information with her.

## 2019-02-19 NOTE — Telephone Encounter (Signed)
No samples at this dosage. Patient has Medicare. Rx to pharmacy to check for insurance coverage.

## 2019-02-19 NOTE — Telephone Encounter (Signed)
Patient's grandson is coming with her to her appt tomorrow to help assist her since she is handicapped.

## 2019-02-20 NOTE — Telephone Encounter (Signed)
[facility] PHARM[facility] THE zENPEP IS COVERED. pATIENT NOTIFIED.

## 2019-02-23 NOTE — Telephone Encounter (Signed)
Spoke with the patient. She reports another spell of chest pain after taking Zenpep. She also had incontinent stool when eating. She states it was urgent and uncontrollable. She has continued to take the Zenpep hoping that "it needs to just get in my system." She has concerns with drinking the amount of PO fluids the pharmacy printout says she is supposed to have with Zenpep. States she has CHF and cardiology says to limit her PO fluid intake. Patient expresses a desire to see Dr Fuller Plan. She will see cardiology tomorrow. Agrees to an appointment with Dr Fuller Plan on Wednesday at 9:10 am. She requires assistance walking because she is a fall risk. A family member will accompany her.

## 2019-02-23 NOTE — Telephone Encounter (Signed)
Pt's grandson reported that pt experienced chest pain after taking Zenzep.  He would like to discuss.

## 2019-02-24 ENCOUNTER — Encounter: Payer: Self-pay | Admitting: Cardiology

## 2019-02-24 ENCOUNTER — Encounter (INDEPENDENT_AMBULATORY_CARE_PROVIDER_SITE_OTHER): Payer: Self-pay

## 2019-02-24 ENCOUNTER — Ambulatory Visit (INDEPENDENT_AMBULATORY_CARE_PROVIDER_SITE_OTHER): Payer: Medicare Other | Admitting: Cardiology

## 2019-02-24 ENCOUNTER — Other Ambulatory Visit: Payer: Self-pay

## 2019-02-24 VITALS — BP 140/70 | HR 68 | Ht 60.0 in | Wt 201.0 lb

## 2019-02-24 DIAGNOSIS — I2510 Atherosclerotic heart disease of native coronary artery without angina pectoris: Secondary | ICD-10-CM

## 2019-02-24 DIAGNOSIS — I5032 Chronic diastolic (congestive) heart failure: Secondary | ICD-10-CM | POA: Diagnosis not present

## 2019-02-24 DIAGNOSIS — R609 Edema, unspecified: Secondary | ICD-10-CM

## 2019-02-24 DIAGNOSIS — I10 Essential (primary) hypertension: Secondary | ICD-10-CM | POA: Diagnosis not present

## 2019-02-24 DIAGNOSIS — I2583 Coronary atherosclerosis due to lipid rich plaque: Secondary | ICD-10-CM

## 2019-02-24 MED ORDER — POTASSIUM CHLORIDE CRYS ER 20 MEQ PO TBCR: 20.0000 meq | EXTENDED_RELEASE_TABLET | Freq: Three times a day (TID) | ORAL | 3 refills | Status: DC

## 2019-02-24 NOTE — Telephone Encounter (Signed)
Ok. Thanks!

## 2019-02-24 NOTE — Patient Instructions (Addendum)
Medication Instructions:  Please increase your Potassium Chloride to three times a day. Continue all other medications as listed  *If you need a refill on your cardiac medications before your next appointment, please call your pharmacy*  Testing/Procedures: Your physician has requested that you have a lower extremity venous duplex. This test is an ultrasound of the veins in the legs or arms. It looks at venous blood flow that carries blood from the heart to the legs or arms. Allow one hour for a Lower Venous exam. Allow thirty minutes for an Upper Venous exam. There are no restrictions or special instructions.  Follow-Up: At [facility], you and your health needs are our priority.  As part of our continuing mission to provide you with exceptional heart care, we have created designated Provider Care Teams.  These Care Teams include your primary Cardiologist (physician) and Advanced Practice Providers (APPs -  Physician Assistants and Nurse Practitioners) who all work together to provide you with the care you need, when you need it.  Your next appointment:   6 months  The format for your next appointment:   In Person  Provider:   You may see Candee Furbish, MD or one of the following Advanced Practice Providers on your designated Care Team:    Truitt Merle, NP  Cecilie Kicks, NP  Kathyrn Drown, NP   Thank you for choosing [facility]!!

## 2019-02-24 NOTE — Progress Notes (Signed)
Cardiology Office Note   Date:  02/24/2019   ID:  [name], DOB Nov 21, 1940, MRN [id number]  PCP:  [name], MD  Cardiologist:   Candee Furbish, MD       History of Present Illness: [name] is a 78 y.o. female former patient of Dr. Ron Parker who in 2002 had an BMS Express 3.5 x 20 mm stent placed in the proximal LAD with 3 subsequent catheterization showing patent stent here for follow-up.  Physical history of not being able to tolerate aspirin. Stomach pain.  She remains on Plavix long-term.  Left arm focal pain sometimes, right as well. Likely noncardiac. She also discussed occasional right-sided jaw pain at rest. Nonexertional. This seems to be relieved when she stretches surgical back. Lives with son and his wife. Son encourages NTG use.   Has ulcerative colitis. Had trouble at the beach. Back, disk trouble. Discouraged by body aches.  06/04/17 - doing well. UC flair. No CP, no SOB. Likes to drink water. Keeps at beside. No SOB. Weight up.   12/10/2017- she is here for preoperative evaluation prior to colonoscopy.  This is a low risk procedure and usually does not require cardiac risk stratification.  Denies any shortness of breath chest pain but she has been having some more swelling in her left leg she wanted to be seen prior to colonoscopy.  She states that her edema has been fairly chronic and this does happen during the summertime.  She has not had any predisposing factors to DVT.  No pain, no redness.  See below.  Some back pain, ruptured disc. Using walker.   Needs to come off Plavix for epidural as well.  Colonoscopy.  02/24/19 - Leg cramps, on potassuym. Had pancreatitis. Left leg > R. Tried Creon but had indigestion. Now on Zenpep.    Past Medical History:  Diagnosis Date  . Adenomatous colon polyp 02/2006  . Allergy   . Allergy, unspecified not elsewhere classified   . Anemia   . Anxiety   . Arthritis   . Asthma   . Blood transfusion without reported  diagnosis   . Bronchiectasis   . CAD (coronary artery disease)    BMS to the LAD, 2002; cath 12/07/11 patent LAD stent and mild nonobstructive disease, EF 65%; Medical management  . Cataract   . CHF (congestive heart failure) (HCC)    Diastolic  . Diabetes mellitus   . Diverticulosis of colon   . DJD (degenerative joint disease)   . Fibromyalgia   . GERD (gastroesophageal reflux disease)   . Hypercholesterolemia   . Hypertension   . Microscopic hematuria   . Neoplasm of kidney   . Obesity   . Other diseases of lung, not elsewhere classified   . Pinched vertebral nerve   . Pulmonary nodule    Negative PET in 2010  . Stress incontinence, female   . Syncope   . Ulcerative colitis, left sided ([place]) 2008   Hx of  . UTI (lower urinary tract infection)   . Vitamin D deficiency disease     Past Surgical History:  Procedure Laterality Date  . ABDOMINAL HYSTERECTOMY    . CARDIAC CATHETERIZATION  11/2011  . CHOLECYSTECTOMY    . COLONOSCOPY    . COLONOSCOPY W/ BIOPSIES  11-12-12   per Dr. Fuller Plan, diverticulosis only, repeat in 5 yrs  . CORONARY STENT PLACEMENT  2002   BMS to the LAD  . ESOPHAGOGASTRODUODENOSCOPY  12/2008  . HAND SURGERY  01/2006   Right Hand Surgery by Dr. Amedeo Plenty  . LEFT HEART CATHETERIZATION WITH CORONARY ANGIOGRAM N/A 12/07/2011   Procedure: LEFT HEART CATHETERIZATION WITH CORONARY ANGIOGRAM;  Surgeon: Peter M Martinique, MD;  Location: [facility] CATH LAB;  Service: Cardiovascular;  Laterality: N/A;     Current Outpatient Medications  Medication Sig Dispense Refill  . ACCU-CHEK AVIVA PLUS test strip TEST ONCE PER DAY AND DIAGNOSIS CODE IS E 11.9 100 each 1  . ACCU-CHEK FASTCLIX LANCETS MISC Test once per day and diagnosis code is E 11.9 102 each 1  . acetaminophen (TYLENOL) 500 MG tablet Take 500 mg by mouth every 6 (six) hours as needed for headache (pain).     Marland Kitchen albuterol (PROVENTIL) (2.5 MG/3ML) 0.083% nebulizer solution Take 3 mLs (2.5 mg total) by nebulization every 4  (four) hours as needed for wheezing or shortness of breath. 75 mL 3  . albuterol (VENTOLIN HFA) 108 (90 BASE) MCG/ACT inhaler Inhale 2 puffs into the lungs every 4 (four) hours as needed for wheezing or shortness of breath. 18 each 11  . ALPRAZolam (XANAX) 0.5 MG tablet TAKE 1 TABLET BY MOUTH THREE TIMES A DAY AS NEEDED 90 tablet 5  . amLODipine (NORVASC) 10 MG tablet TAKE 1 TABLET BY MOUTH EVERY DAY 90 tablet 2  . atenolol (TENORMIN) 50 MG tablet TAKE 1 TABLET BY MOUTH EVERY DAY 90 tablet 2  . atorvastatin (LIPITOR) 10 MG tablet TAKE 1 TABLET BY MOUTH EVERY DAY 90 tablet 1  . azelastine (ASTELIN) 0.1 % nasal spray Place 1 spray into both nostrils at bedtime.    . budesonide-formoterol (SYMBICORT) 160-4.5 MCG/ACT inhaler Inhale 2 puffs into the lungs 2 (two) times daily.    . Cholecalciferol (VITAMIN D-3) 5000 UNITS TABS Take 5,000 Units by mouth daily.      . clopidogrel (PLAVIX) 75 MG tablet TAKE 1 TABLET BY MOUTH EVERY DAY 90 tablet 1  . clotrimazole (LOTRIMIN) 1 % external solution Apply 1 application topically 2 (two) times daily. In between toes 60 mL 5  . diclofenac sodium (VOLTAREN) 1 % GEL As needed for pain    . diphenoxylate-atropine (LOMOTIL) 2.5-0.025 MG tablet Take 1 tablet by mouth 3 (three) times daily as needed for diarrhea or loose stools. 60 tablet 2  . fluticasone (FLONASE) 50 MCG/ACT nasal spray PLACE 2 SPRAYS IN EACH NOSTRIL AT BEDTIME 16 g 6  . furosemide (LASIX) 40 MG tablet TAKE 1 TABLET (40 MG TOTAL) BY MOUTH 2 (TWO) TIMES DAILY. 180 tablet 0  . glimepiride (AMARYL) 1 MG tablet TAKE 1 TABLET BY MOUTH DAILY BEFORE BREAKFAST. 90 tablet 3  . glycopyrrolate (ROBINUL) 1 MG tablet Take 1 tablet (1 mg total) by mouth 2 (two) times daily. 60 tablet 5  . HYDROcodone-homatropine (HYDROMET) 5-1.5 MG/5ML syrup Take 5 mLs by mouth every 4 (four) hours as needed. 240 mL 0  . hydrOXYzine (ATARAX/VISTARIL) 25 MG tablet TAKE 1 TABLET EVERY 4 HOURS AS NEEDED FOR ITCHING 50 tablet 0  .  isosorbide mononitrate (IMDUR) 60 MG 24 hr tablet TAKE 1 TABLET BY MOUTH EVERY DAY 90 tablet 2  . ketoconazole (NIZORAL) 2 % cream Apply 1 application topically daily. 15 g 1  . levocetirizine (XYZAL) 5 MG tablet TAKE 1 TABLET BY MOUTH EVERY DAY IN THE EVENING 90 tablet 3  . LIDODERM 5 % APPLY 1 PATCH TO SKIN FOR 12 HOURS THEN REMOVE AND DISCARD PATCH WITHIN 72 HOURS OR AS DIRECTED 30 patch 2  . loperamide (IMODIUM) 2  MG capsule Take 4-6 mg by mouth daily.    Marland Kitchen losartan (COZAAR) 50 MG tablet Take 1 tablet (50 mg total) by mouth daily. 90 tablet 1  . magnesium oxide (MAG-OX) 400 MG tablet TAKE 1 TABLET BY MOUTH EVERY DAY 90 tablet 3  . mesalamine (LIALDA) 1.2 g EC tablet Take 4 tablets (4.8 g total) by mouth daily with breakfast. 120 tablet 1  . metFORMIN (GLUCOPHAGE) 1000 MG tablet TAKE 1 TABLET (1,000 MG TOTAL) BY MOUTH 2 (TWO) TIMES DAILY WITH A MEAL. 180 tablet 2  . methylPREDNISolone (MEDROL DOSEPAK) 4 MG TBPK tablet Take as directed 21 tablet 0  . montelukast (SINGULAIR) 10 MG tablet Take one every morning 90 tablet 3  . nabumetone (RELAFEN) 750 MG tablet TAKE 1 TABLET BY MOUTH TWICE A DAY WITH MEAL 180 tablet 0  . neomycin-polymyxin-hydrocortisone (CORTISPORIN) otic solution Use 1 drop in affected ear three times daily as needed 10 mL 0  . nitroGLYCERIN (NITROSTAT) 0.4 MG SL tablet PLACE 1 TABLET (0.4 MG TOTAL) UNDER THE TONGUE EVERY 5 (FIVE) MINUTES AS NEEDED FOR CHEST PAIN. 25 tablet 5  . Olopatadine HCl (PAZEO) 0.7 % SOLN Place 1 drop into both eyes daily as needed (itching/allergies).    . ondansetron (ZOFRAN ODT) 4 MG disintegrating tablet Take 1 tablet (4 mg total) by mouth every 8 (eight) hours as needed for nausea or vomiting. 20 tablet 0  . ondansetron (ZOFRAN) 4 MG tablet Take 1 tablet (4 mg total) by mouth every 6 (six) hours as needed for nausea or vomiting. 40 tablet 0  . Pancrelipase, Lip-Prot-Amyl, (ZENPEP) 5000-24000 units CPEP Take 2 capsules by mouth 3 (three) times daily  with meals. Take 1 capsule with snacks-maintain 100 grams of fat intake daily 210 capsule 1  . pantoprazole (PROTONIX) 40 MG tablet TAKE 1 TABLET BY MOUTH TWICE DAILY 180 tablet 1  . Polyethyl Glycol-Propyl Glycol (SYSTANE OP) Place 1 drop into both eyes daily. For dry eyes    . potassium chloride SA (KLOR-CON M20) 20 MEQ tablet Take 1 tablet (20 mEq total) by mouth 3 (three) times daily. 270 tablet 3  . tiZANidine (ZANAFLEX) 4 MG tablet TAKE 1 TABLET BY MOUTH TWICE A DAY 90 tablet 1   Current Facility-Administered Medications  Medication Dose Route Frequency Provider Last Rate Last Dose  . ipratropium-albuterol (DUONEB) 0.5-2.5 (3) MG/3ML nebulizer solution 3 mL  3 mL Nebulization Once Nafziger, Cory, NP        Allergies:   Doxycycline, Amoxicillin, Aspirin, Azithromycin, Caffeine, Ciprofloxacin, Codeine, Oxycodone, Sulfamethizole, and Tramadol hcl    Social History:  The patient  reports that she quit smoking about 45 years ago. Her smoking use included cigarettes. She has a 30.00 pack-year smoking history. She has never used smokeless tobacco. She reports current alcohol use. She reports that she does not use drugs.   Family History:  [name] family history includes Breast cancer in her daughter; Colon polyps in her father; Diabetes in her brother and sister; Heart attack in her father; Hypertension in her sister; Parkinsonism in her mother; Pneumonia in her father; Sarcoidosis in her brother.    ROS:  Please see the history of present illness.   All others neg.   PHYSICAL EXAM: VS:  BP 140/70   Pulse 68   Ht 5' (1.524 m)   Wt 201 lb (91.2 kg)   LMP  (LMP Unknown)   SpO2 94%   BMI 39.26 kg/m  , BMI Body mass index is 39.26  kg/m. GEN: obese Well nourished, well developed, in no acute distress  HEENT: normal  Neck: no JVD, carotid bruits, or masses Cardiac: RRR; no murmurs, rubs, or gallops, left greater than right lower extremity edema Respiratory:  clear to auscultation  bilaterally, normal work of breathing GI: soft, nontender, nondistended, + BS MS: no deformity or atrophy  Skin: warm and dry, no rash Neuro:  Alert and Oriented x 3, Strength and sensation are intact Psych: euthymic mood, full affect    EKG:  EKG today 02/24/2019-sinus rhythm 68 nonspecific T wave changes 03/19/16-sinus rhythm, 72, vertical axis, poor R-wave progression personally viewed-no significant change from prior 09/29/15-normal sinus rhythm, 70, biatrial enlargement, no other abnormalities. Personally viewed  Recent Labs: 12/15/2018: ALT 5; BUN 10; Creatinine, Ser 0.58; Hemoglobin 12.5; Platelets 465.0; Potassium 3.7; Sodium 140; TSH 0.96    Lipid Panel    Component Value Date/Time   CHOL 152 02/06/2018 1003   TRIG 117.0 02/06/2018 1003   HDL 58.40 02/06/2018 1003   CHOLHDL 3 02/06/2018 1003   VLDL 23.4 02/06/2018 1003   LDLCALC 70 02/06/2018 1003      Wt Readings from Last 3 Encounters:  02/24/19 201 lb (91.2 kg)  01/22/19 198 lb (89.8 kg)  08/28/18 205 lb (93 kg)      Other studies Reviewed: Additional studies/ records that were reviewed today include: Prior office notes, lab work, catheterization reports. Review of the above records demonstrates: As above   ASSESSMENT AND PLAN:  Coronary artery disease -2002 proximal LAD bare-metal stent, express stent, Dr. Olevia Perches. -3 subsequent catheterizations reassuring. Last catheterization 2013. -No symptoms currently. Doing well. Secondary prevention.  No angina. -Moderate disease treated medically in other vessels.  Nuclear stress test 2018 overall was low risk.  Aspirin intolerance -Stomach issues if taking aspirin -Continue Plavix lifelong. No change.  Continue with current medication.  Morbid Obesity -Encourage weight loss.This is been trouble at times with her back pain. States not interested in food.  Continue with encouragement.  Ulcerative colitis/pancreatitis -Dr. Fuller Plan - PPI, meds reviewed. This has  been hard on her.  Did not seem to tolerate Creon  Chronic diastolic heart failure -Occasional diuretic. Fluid restriction, salt restriction. Normal renal function. In part secondary to obesity. Stable.   Left greater than right edema  - edema in lower extremity, fairly chronic.  We will go ahead and check lower extremity venous Dopplers to exclude the possibility of DVT.  She states that she has this trouble in the summertime.  She knows to watch her salt, fluids 1.5 L.  She is not having any pain.  No prior surgeries left leg.   This is likely dependent edema/venous insufficiency.  Leg cramping mostly at night -Last potassium was 3.7.  We will go ahead and increase her potassium to 3 times a day.  Hyperlipidemia -Atorvastatin, LDL less than 70. Continue.  No myalgias.  Doing well.  Essential hypertension -Multidrug regimen reviewed. Good blood pressure control. No change, overall stable.  Stable.  Disposition:   FU with Mickel Baas in 6 months [place] in 12 months  Signed, Candee Furbish, MD  02/24/2019 5:14 PM    [facility] [address], [place], [place]  [address] Phone: [phone]; Fax: [phone]

## 2019-02-25 ENCOUNTER — Encounter: Payer: Self-pay | Admitting: Gastroenterology

## 2019-02-25 ENCOUNTER — Other Ambulatory Visit (INDEPENDENT_AMBULATORY_CARE_PROVIDER_SITE_OTHER): Payer: Medicare Other

## 2019-02-25 ENCOUNTER — Ambulatory Visit (INDEPENDENT_AMBULATORY_CARE_PROVIDER_SITE_OTHER): Payer: Medicare Other | Admitting: Gastroenterology

## 2019-02-25 ENCOUNTER — Other Ambulatory Visit: Payer: Self-pay | Admitting: Cardiology

## 2019-02-25 ENCOUNTER — Other Ambulatory Visit: Payer: Self-pay | Admitting: Family Medicine

## 2019-02-25 VITALS — BP 116/68 | HR 68 | Temp 97.4°F | Ht 60.0 in | Wt 197.0 lb

## 2019-02-25 DIAGNOSIS — K51519 Left sided colitis with unspecified complications: Secondary | ICD-10-CM

## 2019-02-25 DIAGNOSIS — D649 Anemia, unspecified: Secondary | ICD-10-CM | POA: Diagnosis not present

## 2019-02-25 DIAGNOSIS — R1013 Epigastric pain: Secondary | ICD-10-CM

## 2019-02-25 DIAGNOSIS — I2583 Coronary atherosclerosis due to lipid rich plaque: Secondary | ICD-10-CM | POA: Diagnosis not present

## 2019-02-25 DIAGNOSIS — K8689 Other specified diseases of pancreas: Secondary | ICD-10-CM | POA: Diagnosis not present

## 2019-02-25 DIAGNOSIS — K580 Irritable bowel syndrome with diarrhea: Secondary | ICD-10-CM

## 2019-02-25 DIAGNOSIS — I2510 Atherosclerotic heart disease of native coronary artery without angina pectoris: Secondary | ICD-10-CM | POA: Diagnosis not present

## 2019-02-25 LAB — CBC WITH DIFFERENTIAL/PLATELET
Basophils Absolute: 0.1 10*3/uL (ref 0.0–0.1)
Basophils Relative: 0.4 % (ref 0.0–3.0)
Eosinophils Absolute: 0.8 10*3/uL — ABNORMAL HIGH (ref 0.0–0.7)
Eosinophils Relative: 6.3 % — ABNORMAL HIGH (ref 0.0–5.0)
HCT: 34.8 % — ABNORMAL LOW (ref 36.0–46.0)
Hemoglobin: 11.3 g/dL — ABNORMAL LOW (ref 12.0–15.0)
Lymphocytes Relative: 18.8 % (ref 12.0–46.0)
Lymphs Abs: 2.4 10*3/uL (ref 0.7–4.0)
MCHC: 32.5 g/dL (ref 30.0–36.0)
MCV: 93 fl (ref 78.0–100.0)
Monocytes Absolute: 0.8 10*3/uL (ref 0.1–1.0)
Monocytes Relative: 6.4 % (ref 3.0–12.0)
Neutro Abs: 8.7 10*3/uL — ABNORMAL HIGH (ref 1.4–7.7)
Neutrophils Relative %: 68.1 % (ref 43.0–77.0)
Platelets: 496 10*3/uL — ABNORMAL HIGH (ref 150.0–400.0)
RBC: 3.74 Mil/uL — ABNORMAL LOW (ref 3.87–5.11)
RDW: 13.7 % (ref 11.5–15.5)
WBC: 12.7 10*3/uL — ABNORMAL HIGH (ref 4.0–10.5)

## 2019-02-25 LAB — COMPREHENSIVE METABOLIC PANEL
ALT: 7 U/L (ref 0–35)
AST: 12 U/L (ref 0–37)
Albumin: 4.4 g/dL (ref 3.5–5.2)
Alkaline Phosphatase: 64 U/L (ref 39–117)
BUN: 10 mg/dL (ref 6–23)
CO2: 27 mEq/L (ref 19–32)
Calcium: 9.1 mg/dL (ref 8.4–10.5)
Chloride: 102 mEq/L (ref 96–112)
Creatinine, Ser: 0.52 mg/dL (ref 0.40–1.20)
GFR: 138.51 mL/min (ref >60.00)
Glucose, Bld: 93 mg/dL (ref 70–99)
Potassium: 3.7 mEq/L (ref 3.5–5.1)
Sodium: 139 mEq/L (ref 135–145)
Total Bilirubin: 0.4 mg/dL (ref 0.2–1.2)
Total Protein: 7.5 g/dL (ref 6.0–8.3)

## 2019-02-25 LAB — MAGNESIUM: Magnesium: 1.5 mg/dL (ref 1.5–2.5)

## 2019-02-25 LAB — IBC PANEL
Iron: 39 ug/dL — ABNORMAL LOW (ref 42–145)
Saturation Ratios: 12.1 % — ABNORMAL LOW (ref 20.0–50.0)
Transferrin: 231 mg/dL (ref 212.0–360.0)

## 2019-02-25 MED ORDER — MESALAMINE 1.2 G PO TBEC: 2.4000 g | DELAYED_RELEASE_TABLET | Freq: Two times a day (BID) | ORAL | 1 refills | Status: DC

## 2019-02-25 MED ORDER — ISOSORBIDE MONONITRATE ER 60 MG PO TB24: 60.0000 mg | ORAL_TABLET | Freq: Every day | ORAL | 3 refills | Status: DC

## 2019-02-25 MED ORDER — LOSARTAN POTASSIUM 50 MG PO TABS: 50.0000 mg | ORAL_TABLET | Freq: Every day | ORAL | 3 refills | Status: DC

## 2019-02-25 NOTE — Patient Instructions (Addendum)
Your provider has requested that you go to the basement level for lab work before leaving today. Press "B" on the elevator. The lab is located at the first door on the left as you exit the elevator.  Remain on Zenpep at current dose.   Please change taking your mesalamine to 2 tablets by mouth twice daily and take Lomotil as needed.   Stop taking your mag-oxide.   You have been scheduled for a CT scan of the abdomen and pelvis at [facility] Radiology department.  You are scheduled on 03/03/19 at 12:30pm. You should arrive 15 minutes prior to your appointment time for registration. Please follow the written instructions below on the day of your exam:  WARNING: IF YOU ARE ALLERGIC TO IODINE/X-RAY DYE, PLEASE NOTIFY RADIOLOGY IMMEDIATELY AT [phone]! YOU WILL BE GIVEN A 13 HOUR PREMEDICATION PREP.  1) Do not eat or drink anything after 8:30am (4 hours prior to your test) 2) You have been given 2 bottles of oral contrast to drink. The solution may taste better if refrigerated, but do NOT add ice or any other liquid to this solution. Shake well before drinking.    Drink 1 bottle of contrast @ 10:30am (2 hours prior to your exam)  Drink 1 bottle of contrast @ 11:30am (1 hour prior to your exam)  You may take any medications as prescribed with a small amount of water, if necessary. If you take any of the following medications: METFORMIN, GLUCOPHAGE, GLUCOVANCE, AVANDAMET, RIOMET, FORTAMET, [place] MET, JANUMET, GLUMETZA or METAGLIP, you MAY be asked to HOLD this medication 48 hours AFTER the exam.  The purpose of you drinking the oral contrast is to aid in the visualization of your intestinal tract. The contrast solution may cause some diarrhea. Depending on your individual set of symptoms, you may also receive an intravenous injection of x-ray contrast/dye.   This test typically takes 30-45 minutes to complete.  If you have any questions regarding your exam or if you need to  reschedule, you may call the CT department at [phone] between the hours of 8:00 am and 5:00 pm, Monday-Friday.  __________________________________________________________________  Thank you for choosing me and [facility].  Pricilla Riffle. Dagoberto Ligas., MD., Marval Regal

## 2019-02-25 NOTE — Telephone Encounter (Signed)
Pt's medications were sent to pt's pharmacy as requested. Confirmation received.  

## 2019-02-25 NOTE — Progress Notes (Addendum)
History of Present Illness: This is a 78 year old female returning for follow-up.  She is accompanied by her daughter.  She recently started on Zenpep for pancreatic insufficiency. Creon was used prior to that however she had chest pain when taking it with meals so it was changed to Zenpep. She is having intermittent chest pains with meals and Zenpep as well. She feels fatigued. She has been having intermittent epigastric pain not clearly related to meals or bowel movements.  Her diarrhea has substantially improved but has not resolved.  She has multiple questions about the use of pancreatic enzymes and pancreatic insufficiency.  She has several questions about her other gastrointestinal medications.  Current Medications, Allergies, Past Medical History, Past Surgical History, Family History and Social History were reviewed in [employer] record.   Physical Exam: General: Well developed, well nourished, no acute distress Head: Normocephalic and atraumatic Eyes:  sclerae anicteric, EOMI Ears: Normal auditory acuity Mouth: No deformity or lesions Lungs: Clear throughout to auscultation Heart: Regular rate and rhythm; no murmurs, rubs or bruits Abdomen: Soft, non tender and non distended. No masses, hepatosplenomegaly or hernias noted. Normal Bowel sounds Rectal: Not done Musculoskeletal: Symmetrical with no gross deformities  Pulses:  Normal pulses noted Extremities: No clubbing, cyanosis, edema or deformities noted Neurological: Alert oriented x 4, grossly nonfocal Psychological:  Alert and cooperative. Normal mood and affect   Assessment and Recommendations:  1. Pancreatic insufficiency.  Continue Zenpep 5000-24000 U 2 before meals, 1 before snacks.  Fat modified diet long-term. Very unlikely Creon or Zenpep are causing chest pain but perhaps her meals are causing chest pain. She needs pancreatic enzyme replacement. In addition she is having intermittent  epigastric pain.  Schedule CT AP. CMP, CBC, Mg today.  Discontinue magnesium oxide as this may be contributing to diarrhea.  Addressed her multiple questions and concerns about the use of pancreatic enzymes to her satisfaction.  She understands this is a chronic condition that requires pancreatic enzyme supplements with each meal and snack for adequate absorption, adequate nutrition, maintenance of weight, control of diarrhea. REV in 2 months.  2. UC, left-sided, under control.  Continue mesalamine 2.4 g p.o. twice daily.  3. IBS-D.  Continue glycopyrrolate 1 mg p.o. twice daily.  Avoid stressor foods.  Use Lomotil tid only as needed for diarrhea.  4. GERD.  Follow antireflux measures and continue pantoprazole 40 mg p.o. twice daily.  If chest pain or epigastric pain persist without a cause identified consider EGD for further evaluation.

## 2019-02-26 ENCOUNTER — Ambulatory Visit (HOSPITAL_COMMUNITY)
Admission: RE | Admit: 2019-02-26 | Discharge: 2019-02-26 | Disposition: A | Discharge status: Home / Self Care | Payer: Medicare Other | Source: Ambulatory Visit | Attending: Cardiology | Admitting: Cardiology

## 2019-02-26 ENCOUNTER — Other Ambulatory Visit: Payer: Self-pay

## 2019-02-26 DIAGNOSIS — R609 Edema, unspecified: Secondary | ICD-10-CM | POA: Insufficient documentation

## 2019-02-27 ENCOUNTER — Other Ambulatory Visit: Payer: Self-pay

## 2019-02-27 DIAGNOSIS — B349 Viral infection, unspecified: Secondary | ICD-10-CM

## 2019-02-27 LAB — FOLATE: Folate: 13.4 ng/mL (ref >5.9)

## 2019-02-27 LAB — VITAMIN B12: Vitamin B-12: 323 pg/mL (ref 211–911)

## 2019-02-27 MED ORDER — IRON 325 (65 FE) MG PO TABS: 325.0000 mg | ORAL_TABLET | Freq: Two times a day (BID) | ORAL | 0 refills | Status: DC

## 2019-03-02 ENCOUNTER — Telehealth: Payer: Self-pay | Admitting: Cardiology

## 2019-03-02 NOTE — Telephone Encounter (Signed)
New message    Patient has follow up  Questions re: VAS [place] LOWER EXTREMITY VENOUS BILAT She is concerned about results and next step in care

## 2019-03-03 ENCOUNTER — Other Ambulatory Visit: Payer: Self-pay

## 2019-03-03 ENCOUNTER — Ambulatory Visit (HOSPITAL_COMMUNITY)
Admission: RE | Admit: 2019-03-03 | Discharge: 2019-03-03 | Disposition: A | Discharge status: Home / Self Care | Payer: Medicare Other | Source: Ambulatory Visit | Attending: Gastroenterology | Admitting: Gastroenterology

## 2019-03-03 ENCOUNTER — Other Ambulatory Visit: Payer: Self-pay | Admitting: Nurse Practitioner

## 2019-03-03 DIAGNOSIS — R1013 Epigastric pain: Secondary | ICD-10-CM

## 2019-03-03 DIAGNOSIS — K51519 Left sided colitis with unspecified complications: Secondary | ICD-10-CM | POA: Diagnosis not present

## 2019-03-03 DIAGNOSIS — K580 Irritable bowel syndrome with diarrhea: Secondary | ICD-10-CM | POA: Insufficient documentation

## 2019-03-03 DIAGNOSIS — K8689 Other specified diseases of pancreas: Secondary | ICD-10-CM

## 2019-03-03 DIAGNOSIS — K5790 Diverticulosis of intestine, part unspecified, without perforation or abscess without bleeding: Secondary | ICD-10-CM | POA: Diagnosis not present

## 2019-03-03 MED ORDER — SODIUM CHLORIDE (PF) 0.9 % IJ SOLN
INTRAMUSCULAR | Status: AC
  Filled 2019-03-03: qty 50

## 2019-03-03 MED ORDER — IOHEXOL 300 MG/ML  SOLN
100.0000 mL | Freq: Once | INTRAMUSCULAR | Status: AC | PRN
  Administered 2019-03-03: 100 mL via INTRAVENOUS

## 2019-03-12 ENCOUNTER — Other Ambulatory Visit: Payer: Self-pay

## 2019-03-12 ENCOUNTER — Ambulatory Visit (AMBULATORY_SURGERY_CENTER): Payer: Self-pay | Admitting: *Deleted

## 2019-03-12 VITALS — Temp 97.3°F | Ht 60.0 in | Wt 202.0 lb

## 2019-03-12 DIAGNOSIS — D509 Iron deficiency anemia, unspecified: Secondary | ICD-10-CM

## 2019-03-12 NOTE — Progress Notes (Signed)
Grandson in PV with Pt- temp 96.8- with mask on   No egg or soy allergy known to patient  No issues with past sedation with any surgeries  or procedures, no intubation problems  No diet pills per patient No home 02 use per patient  No blood thinners per patient  Pt denies issues with constipation  No A fib or A flutter  EMMI video sent to pt's e mail   COV test 11-25 WED 1120 am   Due to the COVID-19 pandemic we are asking patients to follow these guidelines. Please only bring one care partner. Please be aware that your care partner may wait in the car in the parking lot or if they feel like they will be too hot to wait in the car, they may wait in the lobby on the 4th floor. All care partners are required to wear a mask the entire time (we do not have any that we can provide them), they need to practice social distancing, and we will do a Covid check for all patient's and care partners when you arrive. Also we will check their temperature and your temperature. If the care partner waits in their car they need to stay in the parking lot the entire time and we will call them on their cell phone when the patient is ready for discharge so they can bring the car to the front of the building. Also all patient's will need to wear a mask into building.

## 2019-03-12 NOTE — Addendum Note (Signed)
Addended by: Steva Ready on: 03/12/2019 11:04 AM   Modules accepted: Orders

## 2019-03-14 ENCOUNTER — Other Ambulatory Visit: Payer: Self-pay | Admitting: Family Medicine

## 2019-03-25 ENCOUNTER — Ambulatory Visit (INDEPENDENT_AMBULATORY_CARE_PROVIDER_SITE_OTHER): Payer: Medicare Other

## 2019-03-25 ENCOUNTER — Other Ambulatory Visit: Payer: Self-pay | Admitting: Gastroenterology

## 2019-03-25 DIAGNOSIS — Z1159 Encounter for screening for other viral diseases: Secondary | ICD-10-CM

## 2019-03-27 LAB — SARS CORONAVIRUS 2 (TAT 6-24 HRS): SARS Coronavirus 2: NEGATIVE

## 2019-03-31 ENCOUNTER — Ambulatory Visit (AMBULATORY_SURGERY_CENTER): Payer: Medicare Other | Admitting: Gastroenterology

## 2019-03-31 ENCOUNTER — Other Ambulatory Visit: Payer: Self-pay

## 2019-03-31 ENCOUNTER — Encounter: Payer: Self-pay | Admitting: Gastroenterology

## 2019-03-31 VITALS — BP 154/83 | HR 62 | Temp 98.5°F | Resp 20 | Ht 60.0 in | Wt 202.0 lb

## 2019-03-31 DIAGNOSIS — R1013 Epigastric pain: Secondary | ICD-10-CM

## 2019-03-31 DIAGNOSIS — K317 Polyp of stomach and duodenum: Secondary | ICD-10-CM | POA: Diagnosis not present

## 2019-03-31 DIAGNOSIS — I2510 Atherosclerotic heart disease of native coronary artery without angina pectoris: Secondary | ICD-10-CM | POA: Diagnosis not present

## 2019-03-31 DIAGNOSIS — B9681 Helicobacter pylori [H. pylori] as the cause of diseases classified elsewhere: Secondary | ICD-10-CM | POA: Diagnosis not present

## 2019-03-31 DIAGNOSIS — K219 Gastro-esophageal reflux disease without esophagitis: Secondary | ICD-10-CM | POA: Diagnosis not present

## 2019-03-31 DIAGNOSIS — D508 Other iron deficiency anemias: Secondary | ICD-10-CM

## 2019-03-31 DIAGNOSIS — D509 Iron deficiency anemia, unspecified: Secondary | ICD-10-CM | POA: Diagnosis not present

## 2019-03-31 DIAGNOSIS — E119 Type 2 diabetes mellitus without complications: Secondary | ICD-10-CM | POA: Diagnosis not present

## 2019-03-31 DIAGNOSIS — K2950 Unspecified chronic gastritis without bleeding: Secondary | ICD-10-CM

## 2019-03-31 DIAGNOSIS — I10 Essential (primary) hypertension: Secondary | ICD-10-CM | POA: Diagnosis not present

## 2019-03-31 DIAGNOSIS — M797 Fibromyalgia: Secondary | ICD-10-CM | POA: Diagnosis not present

## 2019-03-31 DIAGNOSIS — K3189 Other diseases of stomach and duodenum: Secondary | ICD-10-CM

## 2019-03-31 DIAGNOSIS — J45909 Unspecified asthma, uncomplicated: Secondary | ICD-10-CM | POA: Diagnosis not present

## 2019-03-31 MED ORDER — SODIUM CHLORIDE 0.9 % IV SOLN: 500.0000 mL | Freq: Once | INTRAVENOUS | Status: DC

## 2019-03-31 NOTE — Progress Notes (Signed)
Called to room to assist during endoscopic procedure.  Patient ID and intended procedure confirmed with present staff. Received instructions for my participation in the procedure from the performing physician.

## 2019-03-31 NOTE — Op Note (Signed)
[facility] Patient Name: [name] Procedure Date: 03/31/2019 10:41 AM MRN: [id number] Endoscopist: Ladene Artist , MD Age: 78 Referring MD:  Date of Birth: Jan 20, 1941 Gender: Female Account #: [id number] Procedure:                Upper GI endoscopy Indications:              Epigastric abdominal pain, Iron deficiency anemia Medicines:                Monitored Anesthesia Care Procedure:                Pre-Anesthesia Assessment:                           - Prior to the procedure, a History and Physical                            was performed, and patient medications and                            allergies were reviewed. [name] tolerance of                            previous anesthesia was also reviewed. The risks                            and benefits of the procedure and the sedation                            options and risks were discussed with the patient.                            All questions were answered, and informed consent                            was obtained. Prior Anticoagulants: The patient has                            taken Plavix (clopidogrel), last dose was day of                            procedure. ASA Grade Assessment: III - A patient                            with severe systemic disease. After reviewing the                            risks and benefits, the patient was deemed in                            satisfactory condition to undergo the procedure.                           After obtaining informed consent, the endoscope was  passed under direct vision. Throughout the                            procedure, the patient's blood pressure, pulse, and                            oxygen saturations were monitored continuously. The                            Endoscope was introduced through the mouth, and                            advanced to the second part of duodenum. The upper     GI endoscopy was accomplished without difficulty.                            The patient tolerated the procedure well. Scope In: Scope Out: Findings:                 The examined esophagus was normal.                           Diffuse moderate inflammation characterized by                            congestion (edema), erythema and friability was                            found in the gastric fundus and in the gastric                            body. Biopsies were taken with a cold forceps for                            histology.                           Two 5 mm mucosal papules (nodules) with bleeding                            and no stigmata of recent bleeding were found in                            the cardia. Biopsies were taken with a cold forceps                            for histology.                           The exam of the stomach was otherwise normal.                           The duodenal bulb and second portion of the  duodenum were normal. Biopsies for histology were                            taken with a cold forceps for evaluation of celiac                            disease. Complications:            No immediate complications. Estimated Blood Loss:     Estimated blood loss was minimal. Impression:               - Normal esophagus.                           - Gastritis. Biopsied.                           - Two mucosal papules (nodules) found in the                            stomach. Biopsied.                           - Normal duodenal bulb and second portion of the                            duodenum. Biopsied. Recommendation:           - Patient has a contact number available for                            emergencies. The signs and symptoms of potential                            delayed complications were discussed with the                            patient. Return to normal activities tomorrow.                             Written discharge instructions were provided to the                            patient.                           - Resume previous diet.                           - Continue present medications.                           - Await pathology results.                           - Hold Plavix for 3 days after biopsy then resume.                           -  No aspirin, ibuprofen, naproxen, or other                            non-steroidal anti-inflammatory drugs for 10 days                            after biopsy.                           - Return to GI office in 3 months. Ladene Artist, MD 03/31/2019 11:09:50 AM This report has been signed electronically.

## 2019-03-31 NOTE — Patient Instructions (Signed)
Please read handouts provided. Continue present medications. Await pathology results. Hold Plavix for 3 days after biopsy then resume. Return to GI office in 3 months. No aspirin, ibuprofen, naproxen, or other non-steriodal anti-inflammatory drugs for 10 days.       YOU HAD AN ENDOSCOPIC PROCEDURE TODAY AT [facility]:   Refer to the procedure report that was given to you for any specific questions about what was found during the examination.  If the procedure report does not answer your questions, please call your gastroenterologist to clarify.  If you requested that your care partner not be given the details of your procedure findings, then the procedure report has been included in a sealed envelope for you to review at your convenience later.  YOU SHOULD EXPECT: Some feelings of bloating in the abdomen. Passage of more gas than usual.  Walking can help get rid of the air that was put into your GI tract during the procedure and reduce the bloating. If you had a lower endoscopy (such as a colonoscopy or flexible sigmoidoscopy) you may notice spotting of blood in your stool or on the toilet paper. If you underwent a bowel prep for your procedure, you may not have a normal bowel movement for a few days.  Please Note:  You might notice some irritation and congestion in your nose or some drainage.  This is from the oxygen used during your procedure.  There is no need for concern and it should clear up in a day or so.  SYMPTOMS TO REPORT IMMEDIATELY:    Following upper endoscopy (EGD)  Vomiting of blood or coffee ground material  New chest pain or pain under the shoulder blades  Painful or persistently difficult swallowing  New shortness of breath  Fever of 100F or higher  Black, tarry-looking stools  For urgent or emergent issues, a gastroenterologist can be reached at any hour by calling [phone].   DIET:  We do recommend a small meal at first, but then you may  proceed to your regular diet.  Drink plenty of fluids but you should avoid alcoholic beverages for 24 hours.  ACTIVITY:  You should plan to take it easy for the rest of today and you should NOT DRIVE or use heavy machinery until tomorrow (because of the sedation medicines used during the test).    FOLLOW UP: Our staff will call the number listed on your records 48-72 hours following your procedure to check on you and address any questions or concerns that you may have regarding the information given to you following your procedure. If we do not reach you, we will leave a message.  We will attempt to reach you two times.  During this call, we will ask if you have developed any symptoms of COVID 19. If you develop any symptoms (ie: fever, flu-like symptoms, shortness of breath, cough etc.) before then, please call [phone].  If you test positive for Covid 19 in the 2 weeks post procedure, please call and report this information to [place].    If any biopsies were taken you will be contacted by phone or by letter within the next 1-3 weeks.  Please call us at [phone] if you have not heard about the biopsies in 3 weeks.    SIGNATURES/CONFIDENTIALITY: You and/or your care partner have signed paperwork which will be entered into your electronic medical record.  These signatures attest to the fact that that the information above on your After Visit Summary  has been reviewed and is understood.  Full responsibility of the confidentiality of this discharge information lies with you and/or your care-partner.

## 2019-03-31 NOTE — Progress Notes (Signed)
Report to PACU, RN, vss, BBS= Clear.  

## 2019-03-31 NOTE — Progress Notes (Signed)
Pt's states no medical or surgical changes since previsit or office visit.  CW vitals, JB temp and SM IV.

## 2019-04-01 ENCOUNTER — Telehealth: Payer: Self-pay | Admitting: Cardiology

## 2019-04-01 ENCOUNTER — Other Ambulatory Visit: Payer: Self-pay | Admitting: Cardiology

## 2019-04-01 ENCOUNTER — Other Ambulatory Visit: Payer: Self-pay

## 2019-04-01 MED ORDER — PANTOPRAZOLE SODIUM 40 MG PO TBEC: 40.0000 mg | DELAYED_RELEASE_TABLET | Freq: Two times a day (BID) | ORAL | 1 refills | Status: DC

## 2019-04-01 NOTE — Telephone Encounter (Signed)
Spoke with patient who is reporting she has a nagging pain in her right leg when she lays down at night.  She denies any edema or recent injury.  She reports the swelling she has has always been in her left leg.  After discussing her results again patient does remember now that she had been given the results and that she did not want to be seen by another MD regarding the issue.  Pt now is stating she feels as though the pain she is having is coming from arthritis and she will have her orthopaedic doctor look at it.  She reports "I get shots and epidurals for it."  Advised pt to please f/u and to c/b if any further questions or concerns.

## 2019-04-01 NOTE — Telephone Encounter (Signed)
Result Notes for VAS [place] LOWER EXTREMITY VENOUS (DVT)  Notes recorded by Jerline Pain, MD on 02/27/2019 at 12:39 PM EDT  OK by me. Thanks for update.  Candee Furbish, MD   ------   Notes recorded by Shellia Cleverly, RN on 02/26/2019 at 6:11 PM EDT  Spoke with pt and reviewed result and Dr Marlou Porch comments/recommendation. Pt reports she does not have any swelling in her right leg, it is only in her left leg. She does not feel as though she needs to be seen by Dr Donzetta Matters at this time. Advised I will notify Dr Marlou Porch.   Notes recorded by Jerline Pain, MD on 02/26/2019 at 5:12 PM EDT  Right: Findings consistent with chronic deep vein thrombosis involving the right peroneal veins. All other veins visualized appear fully compressible and demonstrate appropriate Doppler characteristics.    Left: No evidence of deep vein thrombosis in the lower extremity. No indirect evidence of obstruction proximal to the inguinal ligament.   Since she does have some chronic edema in that right lower extremity, I would like for her to have a consultation with Dr. Donzetta Matters to see if any other therapeutics can be offered. Given the chronicity of the DVT in the peroneal veins, chronic anticoagulation is usually not indicated.   Candee Furbish, MD

## 2019-04-01 NOTE — Telephone Encounter (Signed)
Pt states that she was called with the results on the day of her ultrasound.   See result note from 10/29 by Cherre Huger: pt did not think she needed to see Dr. Donzetta Matters.   Pt now states that her R leg has been causing her pain especially when laying down at night.   Pt is unsure who she talked to previously regarding this matter but she did say she was supposed to be referred and has not received a call yet.   No referral found in chart.

## 2019-04-01 NOTE — Telephone Encounter (Signed)
New Message     Pt is calling and says she had a vascular test done that showed she had blood clots in her legs  She says she was suppose to be referred to see a Dr and has not heard anything about scheduling an appt  She says her leg is in pain    Please call

## 2019-04-02 ENCOUNTER — Telehealth: Payer: Self-pay | Admitting: *Deleted

## 2019-04-02 NOTE — Telephone Encounter (Signed)
  Follow up Call-  Call back number 03/31/2019 01/09/2018  Post procedure Call Back phone  # [phone]  Permission to leave phone message Yes Yes  Some recent data might be hidden     Patient questions:  Do you have a fever, pain , or abdominal swelling? No. Pain Score  0 *  Have you tolerated food without any problems? Yes.    Have you been able to return to your normal activities? Yes.    Do you have any questions about your discharge instructions: Diet   No. Medications  No. Follow up visit  No.  Do you have questions or concerns about your Care? No.  Actions: * If pain score is 4 or above: No action needed, pain <4.  1. Have you developed a fever since your procedure? no  2.   Have you had an respiratory symptoms (SOB or cough) since your procedure? no  3.   Have you tested positive for COVID 19 since your procedure no  4.   Have you had any family members/close contacts diagnosed with the COVID 19 since your procedure?  no   If yes to any of these questions please route to Joylene John, RN and Alphonsa Gin, Therapist, sports.

## 2019-04-10 ENCOUNTER — Other Ambulatory Visit: Payer: Self-pay

## 2019-04-10 ENCOUNTER — Encounter: Payer: Self-pay | Admitting: Gastroenterology

## 2019-04-10 MED ORDER — BISMUTH SUBSALICYLATE 262 MG PO CHEW: 524.0000 mg | CHEWABLE_TABLET | Freq: Four times a day (QID) | ORAL | 0 refills | Status: AC

## 2019-04-10 MED ORDER — TETRACYCLINE HCL 500 MG PO CAPS: 500.0000 mg | ORAL_CAPSULE | Freq: Four times a day (QID) | ORAL | 0 refills | Status: AC

## 2019-04-10 MED ORDER — METRONIDAZOLE 250 MG PO TABS: 250.0000 mg | ORAL_TABLET | Freq: Four times a day (QID) | ORAL | 0 refills | Status: AC

## 2019-04-27 ENCOUNTER — Other Ambulatory Visit: Payer: Self-pay | Admitting: Family Medicine

## 2019-04-27 NOTE — Telephone Encounter (Signed)
Okay for refill?

## 2019-05-04 ENCOUNTER — Telehealth: Payer: Self-pay | Admitting: Gastroenterology

## 2019-05-04 DIAGNOSIS — K2970 Gastritis, unspecified, without bleeding: Secondary | ICD-10-CM

## 2019-05-04 DIAGNOSIS — B9681 Helicobacter pylori [H. pylori] as the cause of diseases classified elsewhere: Secondary | ICD-10-CM

## 2019-05-04 NOTE — Telephone Encounter (Signed)
[facility] consult for H pylori treatment

## 2019-05-04 NOTE — Telephone Encounter (Signed)
Left message for scheduling department at [facility]. Also put referral in work que.

## 2019-05-04 NOTE — Telephone Encounter (Signed)
Talicia 4 po tid with food for 14 days She has allergies to azithromycin, sulfa and cipro Hold pantoprazole for the 14 day course or [place]

## 2019-05-04 NOTE — Telephone Encounter (Signed)
Talicia contains amoxicillin. Patient is allergic to amoxicillin. Please advise Dr. Fuller Plan.

## 2019-05-04 NOTE — Telephone Encounter (Signed)
Patient states she took the antibiotics for H. Pylori treatment for a week. She states she started having symptoms of nausea, vomiting and diarrhea. Pt also states she felt "spaced out". Pt states she was dizzy and could not drive. Patient states she stopped taking those medications and has now slowly started feeling better. Patient is continuing to take pantoprazole 40 mg bid. Please advise Dr. Fuller Plan.

## 2019-05-05 NOTE — Telephone Encounter (Signed)
Patient informed already about appt date and time by infectious disease. Patient verbalized understanding.

## 2019-05-05 NOTE — Telephone Encounter (Signed)
Left message patient for patient to return my call.

## 2019-05-06 ENCOUNTER — Other Ambulatory Visit: Payer: Self-pay | Admitting: Family Medicine

## 2019-05-06 NOTE — Telephone Encounter (Signed)
Left a detailed message on verified voice mail.  Please deny this medication so that the pharmacy is made aware.

## 2019-05-06 NOTE — Telephone Encounter (Signed)
Pt needs a visit for further refills

## 2019-05-06 NOTE — Telephone Encounter (Signed)
Medication Refill - Medication: HYDROcodone-acetaminophen (NORCO) 10-325 MG tablet  Has the patient contacted their pharmacy? Yes.   (Agent: If no, request that the patient contact the pharmacy for the refill.) (Agent: If yes, when and what did the pharmacy advise?)  Preferred Pharmacy (with phone number or street name):[id number], [place] Please be advised that RX refills may take up to 3 business days. We ask that you follow-up with your pharmacy.

## 2019-05-06 NOTE — Telephone Encounter (Signed)
Please schedule patient a PMV appointment

## 2019-05-06 NOTE — Telephone Encounter (Signed)
Requested medication (s) are due for refill today: yes  Requested medication (s) are on the active medication list:  yes  Last refill:  03/09/2019  Future visit scheduled: no  Notes to clinic:  This refill cannot be delegated    Requested Prescriptions  Pending Prescriptions Disp Refills   HYDROcodone-acetaminophen (NORCO) 10-325 MG tablet 30 tablet     Sig: Take 1 tablet by mouth 4 (four) times daily as needed.      Not Delegated - Analgesics:  Opioid Agonist Combinations Failed - 05/06/2019  8:00 AM      Failed - This refill cannot be delegated      Failed - Valid encounter within last 6 months    Recent Outpatient Visits           6 months ago Chronic narcotic use   [place] [facility], MD   6 months ago Chronic bilateral low back pain with bilateral sciatica   [facility] [place], MD   1 year ago Lower extremity edema   [place] [facility], MD   1 year ago Moderate asthma with exacerbation, unspecified whether persistent   [facility] [place], NP   2 years ago Skin lesion   [facility] [place], MD              [place] completed in last 360 days.

## 2019-05-07 ENCOUNTER — Other Ambulatory Visit: Payer: Self-pay | Admitting: Cardiology

## 2019-05-11 ENCOUNTER — Telehealth: Payer: Self-pay

## 2019-05-11 NOTE — Telephone Encounter (Signed)
COVID-19 Pre-Screening Questions:  Do you currently have a fever (>100 F), chills or unexplained body aches? NO   Are you currently experiencing new cough, shortness of breath, sore throat, runny nose?NO .  Have you recently travelled outside the state of [place] in the last 14 days? NO .  Have you been in contact with someone that is currently pending confirmation of Covid19 testing or has been confirmed to have the [place] virus?  NO  **If the patient answers NO to ALL questions -  advise the patient to please call the clinic before coming to the office should any symptoms develop.

## 2019-05-12 ENCOUNTER — Other Ambulatory Visit: Payer: Self-pay

## 2019-05-12 ENCOUNTER — Ambulatory Visit (INDEPENDENT_AMBULATORY_CARE_PROVIDER_SITE_OTHER): Payer: Medicare Other | Admitting: Internal Medicine

## 2019-05-12 ENCOUNTER — Encounter: Payer: Self-pay | Admitting: Internal Medicine

## 2019-05-12 DIAGNOSIS — A048 Other specified bacterial intestinal infections: Secondary | ICD-10-CM | POA: Insufficient documentation

## 2019-05-12 HISTORY — DX: Other specified bacterial intestinal infections: A04.8

## 2019-05-12 MED ORDER — PANTOPRAZOLE SODIUM 40 MG PO TBEC: 40.0000 mg | DELAYED_RELEASE_TABLET | Freq: Two times a day (BID) | ORAL | 0 refills | Status: DC

## 2019-05-12 MED ORDER — AMOXICILLIN 500 MG PO CAPS: 1000.0000 mg | ORAL_CAPSULE | Freq: Two times a day (BID) | ORAL | 0 refills | Status: DC

## 2019-05-12 MED ORDER — LEVOFLOXACIN 500 MG PO TABS: 500.0000 mg | ORAL_TABLET | Freq: Every day | ORAL | 0 refills | Status: DC

## 2019-05-12 NOTE — Progress Notes (Signed)
[facility]      Reason for Consult: H pylori infection    Referring Physician: Dr. Fuller Plan    Patient ID: [name], female    DOB: 01/21/1941, 79 y.o.   MRN: [id number]  HPI:   She has a history of pancreatic insufficiency and on Creon and was having intermittent epigastric pain and underwent EGD.  Biopsy positive for H pylori.  She has multiple allergies and intolerances to medication but does tolerate levaquin and amoxicillin.  She was given initially quadruple therapy but did not tolerate and then tetracycline-based treatment and also did not tolerate with n/v.  She is having no current epigastric pain.  Has been taking a ppi once a day.  No associated weight loss.    Past Medical History:  Diagnosis Date  . Adenomatous colon polyp 02/2006  . Allergy   . Allergy, unspecified not elsewhere classified   . Anemia   . Anxiety   . Arthritis   . Asthma   . Blood transfusion without reported diagnosis   . Bronchiectasis   . CAD (coronary artery disease)    BMS to the LAD, 2002; cath 12/07/11 patent LAD stent and mild nonobstructive disease, EF 65%; Medical management  . Cataract    removed both eyes  . CHF (congestive heart failure) (HCC)    Diastolic  . Clotting disorder (HCC)    in legs per pt   . Diabetes mellitus   . Diverticulosis of colon   . DJD (degenerative joint disease)   . Fibromyalgia   . GERD (gastroesophageal reflux disease)   . Hypercholesterolemia   . Hypertension   . Microscopic hematuria   . Neoplasm of kidney   . Obesity   . Other diseases of lung, not elsewhere classified   . Pinched vertebral nerve   . Pulmonary nodule    Negative PET in 2010  . Stress incontinence, female   . Syncope   . Ulcerative colitis, left sided ([place]) 2008   Hx of  . UTI (lower urinary tract infection)   . Vitamin D deficiency disease     Prior to Admission medications   Medication Sig Start Date End Date Taking? Authorizing Provider   ACCU-CHEK AVIVA PLUS test strip TEST ONCE PER DAY AND DIAGNOSIS CODE IS E 11.9 03/16/19  Yes [name], MD  ACCU-CHEK FASTCLIX LANCETS MISC Test once per day and diagnosis code is E 11.9 12/02/15  Yes [name], MD  acetaminophen (TYLENOL) 500 MG tablet Take 500 mg by mouth every 6 (six) hours as needed for headache (pain).    Yes [provider]  albuterol (PROVENTIL) (2.5 MG/3ML) 0.083% nebulizer solution Take 3 mLs (2.5 mg total) by nebulization every 4 (four) hours as needed for wheezing or shortness of breath. 02/12/17  Yes [name], MD  albuterol (VENTOLIN HFA) 108 (90 BASE) MCG/ACT inhaler Inhale 2 puffs into the lungs every 4 (four) hours as needed for wheezing or shortness of breath. 05/25/14  Yes [name], MD  ALPRAZolam Duanne Moron) 0.5 MG tablet TAKE 1 TABLET BY MOUTH THREE TIMES A DAY AS NEEDED 04/28/19  Yes [name], MD  amLODipine (NORVASC) 10 MG tablet TAKE 1 TABLET BY MOUTH EVERY DAY 11/06/18  Yes Jerline Pain, MD  atenolol (TENORMIN) 50 MG tablet TAKE 1 TABLET BY MOUTH EVERY DAY 05/07/19  Yes Jerline Pain, MD  atorvastatin (LIPITOR) 10 MG tablet TAKE 1 TABLET BY MOUTH EVERY DAY  05/07/19  Yes Jerline Pain, MD  azelastine (ASTELIN) 0.1 % nasal spray Place 1 spray into both nostrils at bedtime. 12/08/16  Yes [provider]  budesonide-formoterol (SYMBICORT) 160-4.5 MCG/ACT inhaler Inhale 2 puffs into the lungs 2 (two) times daily.   Yes [provider]  Cholecalciferol (VITAMIN D-3) 5000 UNITS TABS Take 5,000 Units by mouth daily.     Yes [provider]  clopidogrel (PLAVIX) 75 MG tablet TAKE 1 TABLET BY MOUTH EVERY DAY 02/20/19  Yes Jerline Pain, MD  diclofenac sodium (VOLTAREN) 1 % GEL As needed for pain 06/18/18  Yes [provider]  diphenoxylate-atropine (LOMOTIL) 2.5-0.025 MG tablet Take 1 tablet by mouth 3 (three) times daily as needed for diarrhea or loose stools. 01/22/19  Yes Willia Craze, NP  Ferrous  Sulfate (IRON) 325 (65 Fe) MG TABS Take 1 tablet (325 mg total) by mouth 2 (two) times daily. 02/27/19  Yes Ladene Artist, MD  fluticasone [facility]) 50 MCG/ACT nasal spray PLACE 2 SPRAYS IN EACH NOSTRIL AT BEDTIME 07/26/15  Yes [name], MD  furosemide (LASIX) 40 MG tablet TAKE 1 TABLET (40 MG TOTAL) BY MOUTH 2 (TWO) TIMES DAILY. 04/02/19  Yes Jerline Pain, MD  glimepiride (AMARYL) 1 MG tablet TAKE 1 TABLET BY MOUTH EVERY DAY BEFORE BREAKFAST 03/16/19  Yes [name], MD  glycopyrrolate (ROBINUL) 1 MG tablet Take 1 tablet (1 mg total) by mouth 2 (two) times daily. 01/12/19  Yes Ladene Artist, MD  HYDROcodone-acetaminophen (NORCO) 10-325 MG tablet Take 1 tablet by mouth 4 (four) times daily as needed. 03/09/19  Yes [provider]  hydrOXYzine (ATARAX/VISTARIL) 25 MG tablet TAKE 1 TABLET EVERY 4 HOURS AS NEEDED FOR ITCHING 05/31/17  Yes [name], MD  hydrOXYzine (VISTARIL) 25 MG capsule Take 25 mg by mouth every 6 (six) hours as needed. 04/19/19  Yes [provider]  isosorbide mononitrate (IMDUR) 60 MG 24 hr tablet Take 1 tablet (60 mg total) by mouth daily. 02/25/19  Yes Jerline Pain, MD  levocetirizine (XYZAL) 5 MG tablet TAKE 1 TABLET BY MOUTH EVERY DAY IN THE EVENING 01/08/18  Yes [name], MD  LIDODERM 5 % APPLY 1 PATCH TO SKIN FOR 12 HOURS THEN REMOVE AND DISCARD PATCH WITHIN 72 HOURS OR AS DIRECTED 03/10/13  Yes Noralee Space, MD  loperamide (IMODIUM) 2 MG capsule Take 4-6 mg by mouth daily.   Yes [provider]  losartan (COZAAR) 50 MG tablet Take 1 tablet (50 mg total) by mouth daily. 02/25/19  Yes Jerline Pain, MD  magnesium oxide (MAG-OX) 400 MG tablet TAKE 1 TABLET BY MOUTH EVERY DAY 01/21/19  Yes [name], MD  mesalamine (LIALDA) 1.2 g EC tablet Take 2 tablets (2.4 g total) by mouth 2 (two) times daily. 03/03/19  Yes Ladene Artist, MD  metFORMIN (GLUCOPHAGE) 1000 MG tablet TAKE 1 TABLET (1,000 MG TOTAL) BY MOUTH 2 (TWO) TIMES  DAILY WITH A MEAL. 11/24/18  Yes [name], MD  montelukast (SINGULAIR) 10 MG tablet Take one every morning 10/28/18  Yes [name], MD  nabumetone (RELAFEN) 750 MG tablet TAKE 1 TABLET BY MOUTH TWICE A DAY WITH MEAL 01/20/19  Yes [name], MD  neomycin-polymyxin-hydrocortisone (CORTISPORIN) otic solution Use 1 drop in affected ear three times daily as needed 02/06/12  Yes Noralee Space, MD  nitroGLYCERIN (NITROSTAT) 0.4 MG SL tablet PLACE 1 TABLET (0.4 MG TOTAL) UNDER  THE TONGUE EVERY 5 (FIVE) MINUTES AS NEEDED FOR CHEST PAIN. 11/28/18  Yes Jerline Pain, MD  Olopatadine HCl (PAZEO) 0.7 % SOLN Place 1 drop into both eyes daily as needed (itching/allergies).   Yes [provider]  ondansetron (ZOFRAN ODT) 4 MG disintegrating tablet Take 1 tablet (4 mg total) by mouth every 8 (eight) hours as needed for nausea or vomiting. 01/02/19  Yes Jackquline Denmark, MD  Pancrelipase, Lip-Prot-Amyl, (ZENPEP) 5000-24000 units CPEP Take by mouth. Take 1 with snack, take 2 with complete meal   Yes [provider]  pantoprazole (PROTONIX) 40 MG tablet Take 1 tablet (40 mg total) by mouth 2 (two) times daily. 04/01/19  Yes Ladene Artist, MD  Polyethyl Glycol-Propyl Glycol (SYSTANE OP) Place 1 drop into both eyes daily. For dry eyes   Yes [provider]  potassium chloride SA (KLOR-CON M20) 20 MEQ tablet Take 1 tablet (20 mEq total) by mouth 3 (three) times daily. 02/24/19  Yes Jerline Pain, MD  tiZANidine (ZANAFLEX) 4 MG tablet TAKE 1 TABLET BY MOUTH TWICE A DAY 02/26/19  Yes [name], MD  amoxicillin (AMOXIL) 500 MG capsule Take 2 capsules (1,000 mg total) by mouth 2 (two) times daily. 05/12/19   Rosie Torrez, Okey Regal, MD  clotrimazole (LOTRIMIN) 1 % external solution Apply 1 application topically 2 (two) times daily. In between toes Patient not taking: Reported on 03/12/2019 02/05/17   Landis Martins, DPM  HYDROcodone-homatropine (HYDROMET) 5-1.5 MG/5ML syrup Take 5 mLs by mouth  every 4 (four) hours as needed. Patient not taking: Reported on 05/12/2019 05/14/16   [name], MD  ketoconazole (NIZORAL) 2 % cream Apply 1 application topically daily. Patient not taking: Reported on 03/31/2019 08/28/17   Landis Martins, DPM  levofloxacin (LEVAQUIN) 500 MG tablet Take 1 tablet (500 mg total) by mouth daily. 05/12/19   Thayer Headings, MD  methylPREDNISolone (MEDROL DOSEPAK) 4 MG TBPK tablet Take as directed Patient not taking: Reported on 05/12/2019 01/10/18   Dorothyann Peng, NP  ondansetron (ZOFRAN) 4 MG tablet Take 1 tablet (4 mg total) by mouth every 6 (six) hours as needed for nausea or vomiting. Patient not taking: Reported on 05/12/2019 03/01/15   Esterwood, Amy S, PA-C  pantoprazole (PROTONIX) 40 MG tablet Take 1 tablet (40 mg total) by mouth 2 (two) times daily. 05/12/19   Aristea Posada, Okey Regal, MD    Allergies  Allergen Reactions  . Doxycycline Other (See Comments)    Unsteady gait  . Amoxicillin Other (See Comments)    Unknown reaction  Has patient had a PCN reaction causing immediate rash, facial/tongue/throat swelling, SOB or lightheadedness with hypotension: unk Has patient had a PCN reaction causing severe rash involving mucus membranes or skin necrosis: unk Has patient had a PCN reaction that required hospitalization: unk Has patient had a PCN reaction occurring within the last 10 years: no If all of the above answers are "NO", then may proceed with Cephalosporin use.   . Aspirin Nausea And Vomiting  . Azithromycin Other (See Comments)    REACTION: pt states "ZPak doesn't work"  . Caffeine Nausea And Vomiting  . Ciprofloxacin Nausea And Vomiting  . Codeine Nausea And Vomiting  . Oxycodone     Pt stated, "upsets my stomach" (11/19/17)  . Sulfamethizole Other (See Comments)    Unknown reaction  . Tramadol Hcl Other (See Comments)    REACTION: pt states "spaced-out"    Social History   Tobacco Use  . Smoking  status: Former Smoker    Packs/day: 2.00     Years: 15.00    Pack years: 30.00    Types: Cigarettes    Quit date: 04/30/1973    Years since quitting: 46.0  . Smokeless tobacco: Never Used  . Tobacco comment: ex-smoker: smoked for 10-15 years up to 2ppd, quit 1975.  Substance Use Topics  . Alcohol use: Not Currently    Alcohol/week: 0.0 standard drinks    Comment: none now   . Drug use: No    Family History  Problem Relation Age of Onset  . Pneumonia Father   . Heart attack Father   . Colon polyps Father   . Parkinsonism Mother   . Diabetes Brother   . Sarcoidosis Brother   . Hypertension Sister   . Diabetes Sister   . Breast cancer Daughter   . Colon cancer Neg Hx   . Esophageal cancer Neg Hx   . Rectal cancer Neg Hx   . Stomach cancer Neg Hx     Review of Systems  Constitutional: negative for fevers and chills Gastrointestinal: negative for nausea and vomiting Integument/breast: negative for rash All other systems reviewed and are negative    Constitutional: in no apparent distress  Vitals:   05/12/19 0906  BP: (!) 145/78  Pulse: 70  Temp: 98.4 F (36.9 C)  SpO2: 98%   EYES: anicteric ENMT: + mask Cardiovascular: Cor RRR Respiratory: clear Skin: negatives: no rash Neuro: non-focal  Labs: Lab Results  Component Value Date   WBC 12.7 (H) 02/25/2019   HGB 11.3 (L) 02/25/2019   HCT 34.8 (L) 02/25/2019   MCV 93.0 02/25/2019   PLT 496.0 (H) 02/25/2019    Lab Results  Component Value Date   CREATININE 0.52 02/25/2019   BUN 10 02/25/2019   NA 139 02/25/2019   K 3.7 02/25/2019   CL 102 02/25/2019   CO2 27 02/25/2019    Lab Results  Component Value Date   ALT 7 02/25/2019   AST 12 02/25/2019   ALKPHOS 64 02/25/2019   BILITOT 0.4 02/25/2019   INR 1.01 12/07/2011     Assessment: H pylori infection.  I discussed the treatment options with her and had an extensive discussion about her allergies.  Most are intolerances and she has had amoxicillin and levaquin without issue in the past.  The  amoxicillin was listed as an allergy by her PCP years past due to it being ineffective for pneumonia treatment, so not a true allergy.  Will prescribe her with levaquin, amoxicillin and ppi twice a day.  She will follow up with Dr. Fuller Plan for any further evaluations.  She knows to call me if she has any intolerances to the medication and not able to complete the 14 days.   Plan: 1) 14 days of amoxicillin 1 gram twice a day, levaquin 500 mg daily and ppi 40 mg twice a day

## 2019-05-13 ENCOUNTER — Encounter: Payer: Self-pay | Admitting: Family Medicine

## 2019-05-13 ENCOUNTER — Ambulatory Visit (INDEPENDENT_AMBULATORY_CARE_PROVIDER_SITE_OTHER): Payer: Medicare Other | Admitting: Family Medicine

## 2019-05-13 VITALS — BP 140/80 | HR 75 | Temp 96.6°F | Ht 60.0 in | Wt 203.6 lb

## 2019-05-13 DIAGNOSIS — F1190 Opioid use, unspecified, uncomplicated: Secondary | ICD-10-CM

## 2019-05-13 DIAGNOSIS — G8929 Other chronic pain: Secondary | ICD-10-CM | POA: Diagnosis not present

## 2019-05-13 DIAGNOSIS — M5441 Lumbago with sciatica, right side: Secondary | ICD-10-CM

## 2019-05-13 DIAGNOSIS — M5442 Lumbago with sciatica, left side: Secondary | ICD-10-CM

## 2019-05-13 MED ORDER — HYDROCODONE-ACETAMINOPHEN 10-325 MG PO TABS: 1.0000 | ORAL_TABLET | Freq: Four times a day (QID) | ORAL | 0 refills | Status: AC | PRN

## 2019-05-13 MED ORDER — HYDROCODONE-ACETAMINOPHEN 10-325 MG PO TABS: 1.0000 | ORAL_TABLET | Freq: Four times a day (QID) | ORAL | 0 refills | Status: DC | PRN

## 2019-05-13 MED ORDER — ACCU-CHEK AVIVA PLUS VI STRP: ORAL_STRIP | 5 refills | Status: DC

## 2019-05-13 NOTE — Progress Notes (Signed)
Subjective:    Patient ID: [name], female    DOB: 1940/10/07, 79 y.o.   MRN: [id number]  HPI Here for pain management. She continues to struggle with low back pain. She is also having some left arm and shoulder pain. She will see her orthopedist, Dr. Wandra Feinstein, for this tomorrow.  Indication for chronic opioid: low back pain Medication and dose: Norco 10-325  # pills per month: 120 Last UDS date: 10-28-18 Opioid Treatment Agreement signed (Y/N): 10-28-18 Opioid Treatment Agreement last reviewed with patient:  05-13-19 [facility] reviewed this encounter (include red flags): Yes    Review of Systems     Objective:   Physical Exam        Assessment & Plan:  Pain management, meds were refilled.  Alysia Penna, MD

## 2019-05-14 DIAGNOSIS — M19012 Primary osteoarthritis, left shoulder: Secondary | ICD-10-CM | POA: Diagnosis not present

## 2019-05-14 DIAGNOSIS — M48061 Spinal stenosis, lumbar region without neurogenic claudication: Secondary | ICD-10-CM | POA: Diagnosis not present

## 2019-05-23 ENCOUNTER — Other Ambulatory Visit: Payer: Self-pay | Admitting: Gastroenterology

## 2019-05-23 ENCOUNTER — Other Ambulatory Visit: Payer: Self-pay | Admitting: Cardiology

## 2019-05-23 ENCOUNTER — Other Ambulatory Visit: Payer: Self-pay | Admitting: Family Medicine

## 2019-05-25 ENCOUNTER — Other Ambulatory Visit: Payer: Self-pay

## 2019-05-25 MED ORDER — ZENPEP 20000-63000 UNITS PO CPEP: 40000.0000 [IU] | ORAL_CAPSULE | Freq: Three times a day (TID) | ORAL | 2 refills | Status: DC

## 2019-05-28 DIAGNOSIS — M5030 Other cervical disc degeneration, unspecified cervical region: Secondary | ICD-10-CM | POA: Diagnosis not present

## 2019-05-28 DIAGNOSIS — M7582 Other shoulder lesions, left shoulder: Secondary | ICD-10-CM | POA: Diagnosis not present

## 2019-06-01 ENCOUNTER — Other Ambulatory Visit: Payer: Self-pay | Admitting: Nurse Practitioner

## 2019-06-01 MED ORDER — DIPHENOXYLATE-ATROPINE 2.5-0.025 MG PO TABS: ORAL_TABLET | ORAL | 2 refills | Status: DC

## 2019-06-01 NOTE — Addendum Note (Signed)
Addended by: Marzella Schlein on: 06/01/2019 02:14 PM   Modules accepted: Orders

## 2019-06-01 NOTE — Telephone Encounter (Signed)
Patient is requesting a refill of Lomotil via fax. Can we refill Dr. Fuller Plan?

## 2019-06-01 NOTE — Telephone Encounter (Signed)
Prescription faxed to pharmacy.

## 2019-06-04 DIAGNOSIS — M542 Cervicalgia: Secondary | ICD-10-CM | POA: Diagnosis not present

## 2019-06-08 ENCOUNTER — Ambulatory Visit (INDEPENDENT_AMBULATORY_CARE_PROVIDER_SITE_OTHER): Payer: Medicare Other | Admitting: Neurology

## 2019-06-08 ENCOUNTER — Encounter: Payer: Self-pay | Admitting: Neurology

## 2019-06-08 ENCOUNTER — Other Ambulatory Visit: Payer: Self-pay

## 2019-06-08 VITALS — BP 167/84 | HR 77 | Resp 20 | Ht 60.0 in | Wt 204.0 lb

## 2019-06-08 DIAGNOSIS — R937 Abnormal findings on diagnostic imaging of other parts of musculoskeletal system: Secondary | ICD-10-CM | POA: Diagnosis not present

## 2019-06-08 DIAGNOSIS — I10 Essential (primary) hypertension: Secondary | ICD-10-CM | POA: Diagnosis not present

## 2019-06-08 MED ORDER — GABAPENTIN 300 MG PO CAPS: 300.0000 mg | ORAL_CAPSULE | Freq: Three times a day (TID) | ORAL | 5 refills | Status: DC

## 2019-06-08 NOTE — Progress Notes (Signed)
NEUROLOGY CONSULTATION NOTE  [name] MRN: [id number] DOB: 27-May-1940  Referring provider: Wandra Feinstein, MD Primary care provider: Alysia Penna, MD  Reason for consult:  Spinal cord lesion  HISTORY OF PRESENT ILLNESS: [name] is a 79 year old right-handed African American female with CHF, CAD, diabetes and HTN who presents for spinal cord lesion.  History supplemented by referring provider note.  She is accompanied by her son via phone who supplements history.  For 2 years, she has been experiencing left sided shoulder and arm pain radiating to the forearm as well as numbness and tingling in all of the fingers..  She saw Dr. Layne Benton at [facility].  MRI of cervical spine on 06/04/2019 showed multiple T2 hyperintense lesions within the cervical spinal cord, suspicious for demyelinating disease.  06/04/2019 MRI CERVICAL SPINE WO: multiple T2 hyperintense lesions within the dorsal and central aspects of the cervical cord including posterior to the dens measuring 14 mm in length, additional lesion extending from the C4-5 disc space to the C5-6 disc space measuring 16 mm in length, and a small additional area of hyperintensity at the cervicomedullary junction.  Also noted were mild degenerative changes with mild canal stenosis at C5-6 and C6-7 levels and mild left foraminal stenosis at C5-6.  She denies previous history of transient vision loss, numbness and tingling.  She has some balance problems but also has chronic low back pain.  She has had falls.  She uses a rolling walker.    PAST MEDICAL HISTORY: Past Medical History:  Diagnosis Date  . Adenomatous colon polyp 02/2006  . Allergy   . Allergy, unspecified not elsewhere classified   . Anemia   . Anxiety   . Arthritis   . Asthma   . Blood transfusion without reported diagnosis   . Bronchiectasis   . CAD (coronary artery disease)    BMS to the LAD, 2002; cath 12/07/11 patent LAD stent and mild  nonobstructive disease, EF 65%; Medical management  . Cataract    removed both eyes  . CHF (congestive heart failure) (HCC)    Diastolic  . Clotting disorder (HCC)    in legs per pt   . Diabetes mellitus   . Diverticulosis of colon   . DJD (degenerative joint disease)   . Fibromyalgia   . GERD (gastroesophageal reflux disease)   . Hypercholesterolemia   . Hypertension   . Microscopic hematuria   . Neoplasm of kidney   . Obesity   . Other diseases of lung, not elsewhere classified   . Pinched vertebral nerve   . Pulmonary nodule    Negative PET in 2010  . Stress incontinence, female   . Syncope   . Ulcerative colitis, left sided ([place]) 2008   Hx of  . UTI (lower urinary tract infection)   . Vitamin D deficiency disease     PAST SURGICAL HISTORY: Past Surgical History:  Procedure Laterality Date  . ABDOMINAL HYSTERECTOMY    . CARDIAC CATHETERIZATION  11/2011  . CATARACT EXTRACTION, BILATERAL  2020  . CHOLECYSTECTOMY    . COLONOSCOPY    . COLONOSCOPY W/ BIOPSIES  11-12-12   per Dr. Fuller Plan, diverticulosis only, repeat in 5 yrs  . CORONARY STENT PLACEMENT  2002   BMS to the LAD  . ESOPHAGOGASTRODUODENOSCOPY  12/2008  . HAND SURGERY  01/2006   Right Hand Surgery by Dr. Amedeo Plenty  . LEFT HEART CATHETERIZATION WITH CORONARY ANGIOGRAM N/A 12/07/2011   Procedure: LEFT HEART CATHETERIZATION WITH CORONARY  Cyril Loosen;  Surgeon: Peter M Martinique, MD;  Location: [facility] CATH LAB;  Service: Cardiovascular;  Laterality: N/A;  . POLYPECTOMY    . UPPER GASTROINTESTINAL ENDOSCOPY      MEDICATIONS: Current Outpatient Medications on File Prior to Visit  Medication Sig Dispense Refill  . ACCU-CHEK FASTCLIX LANCETS MISC Test once per day and diagnosis code is E 11.9 102 each 1  . acetaminophen (TYLENOL) 500 MG tablet Take 500 mg by mouth every 6 (six) hours as needed for headache (pain).     Marland Kitchen albuterol (PROVENTIL) (2.5 MG/3ML) 0.083% nebulizer solution Take 3 mLs (2.5 mg total) by nebulization every 4  (four) hours as needed for wheezing or shortness of breath. 75 mL 3  . albuterol (VENTOLIN HFA) 108 (90 BASE) MCG/ACT inhaler Inhale 2 puffs into the lungs every 4 (four) hours as needed for wheezing or shortness of breath. 18 each 11  . ALPRAZolam (XANAX) 0.5 MG tablet TAKE 1 TABLET BY MOUTH THREE TIMES A DAY AS NEEDED 90 tablet 5  . amLODipine (NORVASC) 10 MG tablet TAKE 1 TABLET BY MOUTH EVERY DAY 90 tablet 2  . amoxicillin (AMOXIL) 500 MG capsule Take 2 capsules (1,000 mg total) by mouth 2 (two) times daily. 56 capsule 0  . atenolol (TENORMIN) 50 MG tablet TAKE 1 TABLET BY MOUTH EVERY DAY 90 tablet 2  . atorvastatin (LIPITOR) 10 MG tablet TAKE 1 TABLET BY MOUTH EVERY DAY 90 tablet 2  . azelastine (ASTELIN) 0.1 % nasal spray Place 1 spray into both nostrils at bedtime.    . budesonide-formoterol (SYMBICORT) 160-4.5 MCG/ACT inhaler Inhale 2 puffs into the lungs 2 (two) times daily.    . Cholecalciferol (VITAMIN D-3) 5000 UNITS TABS Take 5,000 Units by mouth daily.      . clopidogrel (PLAVIX) 75 MG tablet TAKE 1 TABLET BY MOUTH EVERY DAY 90 tablet 1  . clotrimazole (LOTRIMIN) 1 % external solution Apply 1 application topically 2 (two) times daily. In between toes 60 mL 5  . diclofenac sodium (VOLTAREN) 1 % GEL As needed for pain    . diclofenac Sodium (VOLTAREN) 1 % GEL     . diphenoxylate-atropine (LOMOTIL) 2.5-0.025 MG tablet TAKE 1 TABLET BY MOUTH THREE TIMES A DAY AS NEEDED FOR DIARRHEA OR LOOSE STOOLS 60 tablet 2  . Ferrous Sulfate (IRON) 325 (65 Fe) MG TABS Take 1 tablet (325 mg total) by mouth 2 (two) times daily. 30 tablet 0  . fluticasone (FLONASE) 50 MCG/ACT nasal spray PLACE 2 SPRAYS IN EACH NOSTRIL AT BEDTIME 16 g 6  . furosemide (LASIX) 40 MG tablet TAKE 1 TABLET (40 MG TOTAL) BY MOUTH 2 (TWO) TIMES DAILY. 180 tablet 2  . gabapentin (NEURONTIN) 300 MG capsule     . glimepiride (AMARYL) 1 MG tablet TAKE 1 TABLET BY MOUTH EVERY DAY BEFORE BREAKFAST 90 tablet 3  . glucose blood  (ACCU-CHEK AVIVA PLUS) test strip TEST ONCE PER DAY AND DIAGNOSIS CODE IS E 11.9 100 strip 5  . glycopyrrolate (ROBINUL) 1 MG tablet Take 1 tablet (1 mg total) by mouth 2 (two) times daily. 60 tablet 5  . [START ON 07/11/2019] HYDROcodone-acetaminophen (NORCO) 10-325 MG tablet Take 1 tablet by mouth every 6 (six) hours as needed for moderate pain. 120 tablet 0  . HYDROcodone-homatropine (HYDROMET) 5-1.5 MG/5ML syrup Take 5 mLs by mouth every 4 (four) hours as needed. 240 mL 0  . hydrOXYzine (ATARAX/VISTARIL) 25 MG tablet TAKE 1 TABLET EVERY 4 HOURS AS NEEDED FOR ITCHING 50 tablet  0  . hydrOXYzine (VISTARIL) 25 MG capsule Take 25 mg by mouth every 6 (six) hours as needed.    . isosorbide mononitrate (IMDUR) 60 MG 24 hr tablet Take 1 tablet (60 mg total) by mouth daily. 90 tablet 3  . ketoconazole (NIZORAL) 2 % cream Apply 1 application topically daily. 15 g 1  . levocetirizine (XYZAL) 5 MG tablet TAKE 1 TABLET BY MOUTH EVERY DAY IN THE EVENING 90 tablet 3  . levofloxacin (LEVAQUIN) 500 MG tablet Take 1 tablet (500 mg total) by mouth daily. 14 tablet 0  . LIDODERM 5 % APPLY 1 PATCH TO SKIN FOR 12 HOURS THEN REMOVE AND DISCARD PATCH WITHIN 72 HOURS OR AS DIRECTED 30 patch 2  . loperamide (IMODIUM) 2 MG capsule Take 4-6 mg by mouth daily.    Marland Kitchen losartan (COZAAR) 50 MG tablet Take 1 tablet (50 mg total) by mouth daily. 90 tablet 3  . magnesium oxide (MAG-OX) 400 MG tablet TAKE 1 TABLET BY MOUTH EVERY DAY 90 tablet 3  . mesalamine (LIALDA) 1.2 g EC tablet Take 2 tablets (2.4 g total) by mouth 2 (two) times daily. 120 tablet 3  . metFORMIN (GLUCOPHAGE) 1000 MG tablet TAKE 1 TABLET (1,000 MG TOTAL) BY MOUTH 2 (TWO) TIMES DAILY WITH A MEAL. 180 tablet 2  . methylPREDNISolone acetate (DEPO-MEDROL) 40 MG/ML injection SMARTSIG:2 Milliliter(s) IM Once PRN    . montelukast (SINGULAIR) 10 MG tablet Take one every morning 90 tablet 3  . nabumetone (RELAFEN) 750 MG tablet TAKE 1 TABLET BY MOUTH TWICE A DAY WITH MEAL  180 tablet 0  . neomycin-polymyxin-hydrocortisone (CORTISPORIN) otic solution Use 1 drop in affected ear three times daily as needed 10 mL 0  . nitroGLYCERIN (NITROSTAT) 0.4 MG SL tablet PLACE 1 TABLET (0.4 MG TOTAL) UNDER THE TONGUE EVERY 5 (FIVE) MINUTES AS NEEDED FOR CHEST PAIN. 75 tablet 0  . Olopatadine HCl (PAZEO) 0.7 % SOLN Place 1 drop into both eyes daily as needed (itching/allergies).    . ondansetron (ZOFRAN ODT) 4 MG disintegrating tablet Take 1 tablet (4 mg total) by mouth every 8 (eight) hours as needed for nausea or vomiting. 20 tablet 0  . ondansetron (ZOFRAN) 4 MG tablet Take 1 tablet (4 mg total) by mouth every 6 (six) hours as needed for nausea or vomiting. 40 tablet 0  . Pancrelipase, Lip-Prot-Amyl, (ZENPEP) 20000-63000 units CPEP Take 40,000 units of lipase by mouth 3 (three) times daily before meals. Take 2 capsules (40,000 units) with meals and 1 capsule (20,000 units) with a snack 240 capsule 2  . pantoprazole (PROTONIX) 40 MG tablet Take 1 tablet (40 mg total) by mouth 2 (two) times daily. 180 tablet 1  . Polyethyl Glycol-Propyl Glycol (SYSTANE OP) Place 1 drop into both eyes daily. For dry eyes    . potassium chloride SA (KLOR-CON M20) 20 MEQ tablet Take 1 tablet (20 mEq total) by mouth 3 (three) times daily. 270 tablet 3  . tiZANidine (ZANAFLEX) 4 MG tablet TAKE 1 TABLET BY MOUTH TWICE A DAY 180 tablet 0   Current Facility-Administered Medications on File Prior to Visit  Medication Dose Route Frequency Provider Last Rate Last Admin  . ipratropium-albuterol (DUONEB) 0.5-2.5 (3) MG/3ML nebulizer solution 3 mL  3 mL Nebulization Once Nafziger, Tommi Rumps, NP        ALLERGIES: Allergies  Allergen Reactions  . Doxycycline Other (See Comments)    Unsteady gait  . Aspirin Nausea And Vomiting  . Azithromycin Other (See Comments)  REACTION: pt states "ZPak doesn't work"  . Caffeine Nausea And Vomiting  . Ciprofloxacin Nausea And Vomiting    Tolerates levaquin  . Codeine  Nausea And Vomiting  . Oxycodone     Pt stated, "upsets my stomach" (11/19/17)  . Sulfamethizole Other (See Comments)    Unknown reaction  . Tramadol Hcl Other (See Comments)    REACTION: pt states "spaced-out"    FAMILY HISTORY: Family History  Problem Relation Age of Onset  . Pneumonia Father   . Heart attack Father   . Colon polyps Father   . Parkinsonism Mother   . Diabetes Brother   . Sarcoidosis Brother   . Hypertension Sister   . Diabetes Sister   . Breast cancer Daughter   . Colon cancer Neg Hx   . Esophageal cancer Neg Hx   . Rectal cancer Neg Hx   . Stomach cancer Neg Hx     SOCIAL HISTORY: Social History   Socioeconomic History  . Marital status: Divorced    Spouse name: Not on file  . Number of children: 3  . Years of education: Not on file  . Highest education level: Not on file  Occupational History  . Occupation: Retired    Fish farm manager: RETIRED  Tobacco Use  . Smoking status: Former Smoker    Packs/day: 2.00    Years: 15.00    Pack years: 30.00    Types: Cigarettes    Quit date: 04/30/1973    Years since quitting: 46.1  . Smokeless tobacco: Never Used  . Tobacco comment: ex-smoker: smoked for 10-15 years up to 2ppd, quit 1975.  Substance and Sexual Activity  . Alcohol use: Not Currently    Alcohol/week: 0.0 standard drinks    Comment: none now   . Drug use: No  . Sexual activity: Never  Other Topics Concern  . Not on file  Social History Narrative  . Not on file   Social Determinants of Health   Financial Resource Strain:   . Difficulty of Paying Living Expenses: Not on file  Food Insecurity:   . Worried About Charity fundraiser in the Last Year: Not on file  . Ran Out of Food in the Last Year: Not on file  Transportation Needs:   . Lack of Transportation (Medical): Not on file  . Lack of Transportation (Non-Medical): Not on file  Physical Activity:   . Days of Exercise per Week: Not on file  . Minutes of Exercise per Session: Not on  file  Stress:   . Feeling of Stress : Not on file  Social Connections:   . Frequency of Communication with Friends and Family: Not on file  . Frequency of Social Gatherings with Friends and Family: Not on file  . Attends Religious Services: Not on file  . Active Member of Clubs or Organizations: Not on file  . Attends Archivist Meetings: Not on file  . Marital Status: Not on file  Intimate Partner Violence:   . Fear of Current or Ex-Partner: Not on file  . Emotionally Abused: Not on file  . Physically Abused: Not on file  . Sexually Abused: Not on file    PHYSICAL EXAM: Blood pressure (!) 167/84, pulse 77, resp. rate 20, height 5' (1.524 m), weight 204 lb (92.5 kg), SpO2 95 %. General: No acute distress.  Patient appears well-groomed.   Head:  Normocephalic/atraumatic Eyes:  fundi examined but not visualized Neck: supple, no paraspinal tenderness, full range of motion  Back: No paraspinal tenderness Heart: regular rate and rhythm Lungs: Clear to auscultation bilaterally. Vascular: No carotid bruits. Neurological Exam: Mental status: alert and oriented to person, place, and time, recent and remote memory intact, fund of knowledge intact, attention and concentration intact, speech fluent and not dysarthric, language intact. Cranial nerves: CN I: not tested CN II: pupils equal, round and reactive to light, visual fields intact CN III, IV, VI:  full range of motion, no nystagmus, no ptosis CN V: facial sensation intact CN VII: upper and lower face symmetric CN VIII: hearing intact CN IX, X: gag intact, uvula midline CN XI: sternocleidomastoid and trapezius muscles intact CN XII: tongue midline Bulk & Tone: normal, no fasciculations. Motor:  4-/5 bilateral proximal upper extremity weakness, otherwise 5/5. Sensation:  Pinprick sensation reduced in first 3 digits of left hand; vibration sensation intact. Deep Tendon Reflexes:  2+ throughout, toes downgoing. Finger to  nose testing:  Without dysmetria.  Gait:  Wide-based antalgic gait.  Romberg with sway.  IMPRESSION: 1.  Abnormal cervical spinal cord MRI suspicious for demyelinating disease.  No past history to suggest demyelinating disease. 2.  Severe left upper extremity pain.  Sensory deficits in fingers corresponds to left C6 dermatome and may be secondary to left C6 radiculopathy as neural foraminal stenosis noted on MRI at that level.  PLAN: 1.  MRI of brain with and without contrast 2.  MRI of cervical spine with contrast 3.  Following MRI, will schedule for LP, for CSF analysis checking cell count, glucose, protein, gram stain and culture, oligoclonal bands, IgG index. 4.  Titrate gabapentin to 371m three times daily 5.  Follow up after testing. 6.  Follow up blood pressure with PCP  Thank you for allowing me to take part in the care of this patient.  AMetta Clines DO  CC:  SAlysia Penna MD  RWandra Feinstein MD

## 2019-06-08 NOTE — Patient Instructions (Addendum)
We will need to evaluate for possible multiple sclerosis 1.  MRI of brain with and without contrast 2.  MRI of cervical spine with contrast 3.  Following MRI, may need to perform spinal tap, checking spinal fluid for cell count, glucose, protein, gram stain and culture, oligoclonal bands, IgG index.  To help with arm pain, will increase gabapentin 376m:  Take 1 capsule in morning and 1 capsule at bedtime for one week  Then 1 capsule three times daily  Follow up after testing.  [facility] will [place] you directly to schedule your appointment. They are located at [address] If you need to contact them directly please call [phone]

## 2019-06-11 ENCOUNTER — Telehealth: Payer: Self-pay | Admitting: Neurology

## 2019-06-11 DIAGNOSIS — R937 Abnormal findings on diagnostic imaging of other parts of musculoskeletal system: Secondary | ICD-10-CM

## 2019-06-11 NOTE — Telephone Encounter (Signed)
[facility] is calling in and left msg with after hours about needing clarification on an order. Thanks!

## 2019-06-11 NOTE — Telephone Encounter (Signed)
Spoke with [facility] and cervical mri order updated.

## 2019-06-23 ENCOUNTER — Telehealth: Payer: Self-pay | Admitting: Gastroenterology

## 2019-06-23 NOTE — Telephone Encounter (Signed)
Attempted to return call.  No VM/ answer.  I will attempt to reach her again later this pm

## 2019-06-25 NOTE — Telephone Encounter (Signed)
No answer or machine.  I will await a return call from the patient.

## 2019-06-29 ENCOUNTER — Telehealth: Payer: Self-pay | Admitting: Neurology

## 2019-06-29 NOTE — Telephone Encounter (Signed)
Patient called with concerns she is in pain and wasn't able to get an MRI scheduled until 07/09/19. She called crying because she was in so much pain. Patient wants to know if she can get help getting seen sooner for MRI at another facility like [facility].

## 2019-06-30 NOTE — Telephone Encounter (Signed)
No answer 06/30/2019

## 2019-06-30 NOTE — Telephone Encounter (Signed)
Tried 3 attempts, no answer, hopefully she can be put on a waiting list.

## 2019-07-09 ENCOUNTER — Other Ambulatory Visit: Payer: Self-pay

## 2019-07-09 ENCOUNTER — Ambulatory Visit
Admission: RE | Admit: 2019-07-09 | Discharge: 2019-07-09 | Disposition: A | Discharge status: Home / Self Care | Payer: Medicare Other | Source: Ambulatory Visit | Attending: Neurology | Admitting: Neurology

## 2019-07-09 DIAGNOSIS — R937 Abnormal findings on diagnostic imaging of other parts of musculoskeletal system: Secondary | ICD-10-CM

## 2019-07-09 DIAGNOSIS — M549 Dorsalgia, unspecified: Secondary | ICD-10-CM | POA: Diagnosis not present

## 2019-07-09 DIAGNOSIS — G35 Multiple sclerosis: Secondary | ICD-10-CM | POA: Diagnosis not present

## 2019-07-09 DIAGNOSIS — R531 Weakness: Secondary | ICD-10-CM | POA: Diagnosis not present

## 2019-07-09 MED ORDER — GADOBENATE DIMEGLUMINE 529 MG/ML IV SOLN
19.0000 mL | Freq: Once | INTRAVENOUS | Status: AC | PRN
  Administered 2019-07-09: 19 mL via INTRAVENOUS

## 2019-07-13 ENCOUNTER — Telehealth: Payer: Self-pay | Admitting: Neurology

## 2019-07-13 DIAGNOSIS — R937 Abnormal findings on diagnostic imaging of other parts of musculoskeletal system: Secondary | ICD-10-CM

## 2019-07-13 MED ORDER — GABAPENTIN 600 MG PO TABS: 600.0000 mg | ORAL_TABLET | Freq: Three times a day (TID) | ORAL | 0 refills | Status: DC

## 2019-07-13 NOTE — Telephone Encounter (Signed)
Patient left message with After Hours service regarding her not having her MRI results. She said her Left arm has been hurting since her first office visit. Please Call. Thank you

## 2019-07-13 NOTE — Telephone Encounter (Signed)
Pt called back no answer unable to leave a voice mail,

## 2019-07-13 NOTE — Telephone Encounter (Signed)
Patient's son called back regarding his mother's results. His # is [phone]. Thank you

## 2019-07-13 NOTE — Telephone Encounter (Signed)
Spoke to pt and her son,  Given MRI results , she will do the LP order was placed , pt informed she needed to have lab work done order placed, and gabapentin was increased from 316m to 600 mg new script was called into the pharmacy for the pt. Pt does not want do the pain clinic at this time

## 2019-07-13 NOTE — Telephone Encounter (Signed)
MRI shows findings suggestive of MS but all old findings.  I wanted to schedule a lumbar puncture, checking cell count, glucose, protein, gram stain and culture, oligoclonal bands, IgG index, angiotensin converting enzyme, and VDRL.  I also want to check a blood test for NMO antibody.  I want her to make a follow-up appointment with me afterwards (about a week following the LP) to discuss further.    If she is on gabapentin 39m three times daily, then she may increase to 6085mthree times daily.  However, if she is in such severe pain that she needs a stronger fast acting medication, then she may need to see a pain specialist

## 2019-07-14 ENCOUNTER — Other Ambulatory Visit: Payer: Self-pay | Admitting: Family Medicine

## 2019-08-13 ENCOUNTER — Telehealth: Payer: Self-pay | Admitting: Family Medicine

## 2019-08-13 DIAGNOSIS — I428 Other cardiomyopathies: Secondary | ICD-10-CM

## 2019-08-13 DIAGNOSIS — I5032 Chronic diastolic (congestive) heart failure: Secondary | ICD-10-CM

## 2019-08-13 NOTE — Progress Notes (Signed)
  Chronic Care Management   Note  08/13/2019 Name: Kavitha Lansdale MRN: [id number] DOB: 10-10-1940  Ayaan Ringle is a 79 y.o. year old female who is a primary care patient of [name], MD. I reached out to [name] by phone today in response to a referral sent by Ms. Sevyn Garrels's PCP, [name], MD.   Ms. Delosreyes was given information about Chronic Care Management services today including:  1. CCM service includes personalized support from designated clinical staff supervised by her physician, including individualized plan of care and coordination with other care providers 2. 24/7 contact phone numbers for assistance for urgent and routine care needs. 3. Service will only be billed when office clinical staff spend 20 minutes or more in a month to coordinate care. 4. Only one practitioner may furnish and bill the service in a calendar month. 5. The patient may stop CCM services at any time (effective at the end of the month) by phone call to the office staff.   Patient agreed to services and verbal consent obtained.   Follow up plan:   Raynicia Dukes [employer] Scheduler

## 2019-08-17 ENCOUNTER — Other Ambulatory Visit: Payer: Self-pay | Admitting: Cardiology

## 2019-08-20 ENCOUNTER — Other Ambulatory Visit: Payer: Self-pay | Admitting: Cardiology

## 2019-08-20 DIAGNOSIS — E119 Type 2 diabetes mellitus without complications: Secondary | ICD-10-CM | POA: Diagnosis not present

## 2019-08-20 DIAGNOSIS — I10 Essential (primary) hypertension: Secondary | ICD-10-CM | POA: Diagnosis not present

## 2019-08-20 DIAGNOSIS — Z961 Presence of intraocular lens: Secondary | ICD-10-CM | POA: Diagnosis not present

## 2019-08-20 DIAGNOSIS — Z7984 Long term (current) use of oral hypoglycemic drugs: Secondary | ICD-10-CM | POA: Diagnosis not present

## 2019-08-20 DIAGNOSIS — H5203 Hypermetropia, bilateral: Secondary | ICD-10-CM | POA: Diagnosis not present

## 2019-08-20 DIAGNOSIS — H524 Presbyopia: Secondary | ICD-10-CM | POA: Diagnosis not present

## 2019-08-20 LAB — HM DIABETES EYE EXAM

## 2019-08-25 ENCOUNTER — Encounter: Payer: Self-pay | Admitting: Family Medicine

## 2019-08-25 ENCOUNTER — Other Ambulatory Visit: Payer: Self-pay | Admitting: Cardiology

## 2019-08-25 DIAGNOSIS — K219 Gastro-esophageal reflux disease without esophagitis: Secondary | ICD-10-CM | POA: Diagnosis not present

## 2019-08-25 DIAGNOSIS — J309 Allergic rhinitis, unspecified: Secondary | ICD-10-CM | POA: Diagnosis not present

## 2019-08-25 DIAGNOSIS — J4540 Moderate persistent asthma, uncomplicated: Secondary | ICD-10-CM | POA: Diagnosis not present

## 2019-08-31 ENCOUNTER — Other Ambulatory Visit: Payer: Self-pay | Admitting: Internal Medicine

## 2019-09-02 NOTE — Progress Notes (Signed)
CARDIOLOGY OFFICE NOTE  Date:  09/08/2019    [name] Date of Birth: [birth year] Medical Record #[id number]  PCP:  [name], MD  Cardiologist:  [facility]   Chief Complaint  Patient presents with  . Follow-up    Seen for Dr. Marlou Porch    History of Present Illness: [name] is a 79 y.o. female who presents today for a 7 month check. Seen for Dr. Marlou Porch. He is a former patient of Dr. Ron Parker.   She has a known history of CAD and in 2002 had an BMS Express 3.5 x 20 mm stent placed in the proximal LAD with 3 subsequent catheterization showing patent stent. She has remained on Plavix. She is intolerant to aspirin. Has tended to have atypical pain, has UC, chronic swelling, back pain and pancreatitis. Low risk Myoview in 2018.   Last seen back in October by Dr. Marlou Porch - felt to be stable from our standpoint.   The patient does not have symptoms concerning for COVID-19 infection (fever, chills, cough, or new shortness of breath).   Comes in today. Here alone. Grandson joined [place]. She feels like her heart is doing ok. Rare NTG use. She is more limited by her back - this is chronic. She has to sit and rest and cannot stand for long periods. She still tries to cook. Still tries to get out and pay bills. Watching her salt. Her swelling is at her baseline. She says her breathing is unchanged. She is not interested in having a COVID vaccine. Do not see where she has had any recent labs. Only on Plavix since she does not tolerate aspirin.   Past Medical History:  Diagnosis Date  . Adenomatous colon polyp 02/2006  . Allergy   . Allergy, unspecified not elsewhere classified   . Anemia   . Anxiety   . Arthritis   . Asthma   . Blood transfusion without reported diagnosis   . Bronchiectasis   . CAD (coronary artery disease)    BMS to the LAD, 2002; cath 12/07/11 patent LAD stent and mild nonobstructive disease, EF 65%; Medical management  . Cataract    removed both eyes  . CHF  (congestive heart failure) (HCC)    Diastolic  . Clotting disorder (HCC)    in legs per pt   . Diabetes mellitus   . Diverticulosis of colon   . DJD (degenerative joint disease)   . Fibromyalgia   . GERD (gastroesophageal reflux disease)   . Hypercholesterolemia   . Hypertension   . Microscopic hematuria   . Neoplasm of kidney   . Obesity   . Other diseases of lung, not elsewhere classified   . Pinched vertebral nerve   . Pulmonary nodule    Negative PET in 2010  . Stress incontinence, female   . Syncope   . Ulcerative colitis, left sided ([place]) 2008   Hx of  . UTI (lower urinary tract infection)   . Vitamin D deficiency disease     Past Surgical History:  Procedure Laterality Date  . ABDOMINAL HYSTERECTOMY    . CARDIAC CATHETERIZATION  11/2011  . CATARACT EXTRACTION, BILATERAL  2020  . CHOLECYSTECTOMY    . COLONOSCOPY    . COLONOSCOPY W/ BIOPSIES  11-12-12   per Dr. Fuller Plan, diverticulosis only, repeat in 5 yrs  . CORONARY STENT PLACEMENT  2002   BMS to the LAD  . ESOPHAGOGASTRODUODENOSCOPY  12/2008  . HAND SURGERY  01/2006   Right  Hand Surgery by Dr. Amedeo Plenty  . LEFT HEART CATHETERIZATION WITH CORONARY ANGIOGRAM N/A 12/07/2011   Procedure: LEFT HEART CATHETERIZATION WITH CORONARY ANGIOGRAM;  Surgeon: Peter M Martinique, MD;  Location: [facility] CATH LAB;  Service: Cardiovascular;  Laterality: N/A;  . POLYPECTOMY    . UPPER GASTROINTESTINAL ENDOSCOPY       Medications: Current Meds  Medication Sig  . ACCU-CHEK FASTCLIX LANCETS MISC Test once per day and diagnosis code is E 11.9  . acetaminophen (TYLENOL) 500 MG tablet Take 500 mg by mouth every 6 (six) hours as needed for headache (pain).   Marland Kitchen albuterol (PROVENTIL) (2.5 MG/3ML) 0.083% nebulizer solution Take 3 mLs (2.5 mg total) by nebulization every 4 (four) hours as needed for wheezing or shortness of breath.  Marland Kitchen albuterol (VENTOLIN HFA) 108 (90 BASE) MCG/ACT inhaler Inhale 2 puffs into the lungs every 4 (four) hours as needed for  wheezing or shortness of breath.  . ALPRAZolam (XANAX) 0.5 MG tablet TAKE 1 TABLET BY MOUTH THREE TIMES A DAY AS NEEDED  . amLODipine (NORVASC) 10 MG tablet TAKE 1 TABLET BY MOUTH EVERY DAY  . amoxicillin (AMOXIL) 500 MG capsule Take 2 capsules (1,000 mg total) by mouth 2 (two) times daily.  Marland Kitchen atenolol (TENORMIN) 50 MG tablet TAKE 1 TABLET BY MOUTH EVERY DAY  . atorvastatin (LIPITOR) 10 MG tablet TAKE 1 TABLET BY MOUTH EVERY DAY  . azelastine (ASTELIN) 0.1 % nasal spray Place 1 spray into both nostrils at bedtime.  . budesonide-formoterol (SYMBICORT) 160-4.5 MCG/ACT inhaler Inhale 2 puffs into the lungs 2 (two) times daily.  . Cholecalciferol (VITAMIN D-3) 5000 UNITS TABS Take 5,000 Units by mouth daily.    . clopidogrel (PLAVIX) 75 MG tablet TAKE 1 TABLET BY MOUTH EVERY DAY  . clotrimazole (LOTRIMIN) 1 % external solution Apply 1 application topically 2 (two) times daily. In between toes  . diclofenac sodium (VOLTAREN) 1 % GEL As needed for pain  . diclofenac Sodium (VOLTAREN) 1 % GEL   . diphenoxylate-atropine (LOMOTIL) 2.5-0.025 MG tablet TAKE 1 TABLET BY MOUTH THREE TIMES A DAY AS NEEDED FOR DIARRHEA OR LOOSE STOOLS  . Ferrous Sulfate (IRON) 325 (65 Fe) MG TABS Take 1 tablet (325 mg total) by mouth 2 (two) times daily.  . fluticasone (FLONASE) 50 MCG/ACT nasal spray PLACE 2 SPRAYS IN EACH NOSTRIL AT BEDTIME  . furosemide (LASIX) 40 MG tablet TAKE 1 TABLET (40 MG TOTAL) BY MOUTH 2 (TWO) TIMES DAILY.  Marland Kitchen gabapentin (NEURONTIN) 300 MG capsule Take 1 capsule (300 mg total) by mouth 3 (three) times daily.  Marland Kitchen gabapentin (NEURONTIN) 600 MG tablet Take 1 tablet (600 mg total) by mouth 3 (three) times daily.  Marland Kitchen glimepiride (AMARYL) 1 MG tablet TAKE 1 TABLET BY MOUTH EVERY DAY BEFORE BREAKFAST  . glucose blood (ACCU-CHEK AVIVA PLUS) test strip TEST ONCE PER DAY AND DIAGNOSIS CODE IS E 11.9  . glycopyrrolate (ROBINUL) 1 MG tablet Take 1 tablet (1 mg total) by mouth 2 (two) times daily.  Marland Kitchen  HYDROcodone-homatropine (HYDROMET) 5-1.5 MG/5ML syrup Take 5 mLs by mouth every 4 (four) hours as needed.  . hydrOXYzine (ATARAX/VISTARIL) 25 MG tablet TAKE 1 TABLET EVERY 4 HOURS AS NEEDED FOR ITCHING  . hydrOXYzine (VISTARIL) 25 MG capsule TAKE 1 CAPSULE (25 MG TOTAL) BY MOUTH EVERY 6 (SIX) HOURS AS NEEDED.  Marland Kitchen isosorbide mononitrate (IMDUR) 60 MG 24 hr tablet Take 1 tablet (60 mg total) by mouth daily.  Marland Kitchen ketoconazole (NIZORAL) 2 % cream Apply 1 application topically  daily.  . levocetirizine (XYZAL) 5 MG tablet TAKE 1 TABLET BY MOUTH EVERY DAY IN THE EVENING  . LIDODERM 5 % APPLY 1 PATCH TO SKIN FOR 12 HOURS THEN REMOVE AND DISCARD PATCH WITHIN 72 HOURS OR AS DIRECTED  . loperamide (IMODIUM) 2 MG capsule Take 4-6 mg by mouth daily.  Marland Kitchen losartan (COZAAR) 50 MG tablet Take 1 tablet (50 mg total) by mouth daily.  . magnesium oxide (MAG-OX) 400 MG tablet TAKE 1 TABLET BY MOUTH EVERY DAY  . mesalamine (LIALDA) 1.2 g EC tablet Take 2 tablets (2.4 g total) by mouth 2 (two) times daily.  . metFORMIN (GLUCOPHAGE) 1000 MG tablet TAKE 1 TABLET (1,000 MG TOTAL) BY MOUTH 2 (TWO) TIMES DAILY WITH A MEAL.  . methylPREDNISolone acetate (DEPO-MEDROL) 40 MG/ML injection SMARTSIG:2 Milliliter(s) IM Once PRN  . montelukast (SINGULAIR) 10 MG tablet Take one every morning  . nabumetone (RELAFEN) 750 MG tablet TAKE 1 TABLET BY MOUTH TWICE A DAY WITH MEAL  . neomycin-polymyxin-hydrocortisone (CORTISPORIN) otic solution Use 1 drop in affected ear three times daily as needed  . nitroGLYCERIN (NITROSTAT) 0.4 MG SL tablet PLACE 1 TABLET (0.4 MG TOTAL) UNDER THE TONGUE EVERY 5 (FIVE) MINUTES AS NEEDED FOR CHEST PAIN.  Marland Kitchen Olopatadine HCl (PAZEO) 0.7 % SOLN Place 1 drop into both eyes daily as needed (itching/allergies).  . ondansetron (ZOFRAN ODT) 4 MG disintegrating tablet Take 1 tablet (4 mg total) by mouth every 8 (eight) hours as needed for nausea or vomiting.  . ondansetron (ZOFRAN) 4 MG tablet Take 1 tablet (4 mg total)  by mouth every 6 (six) hours as needed for nausea or vomiting.  . Pancrelipase, Lip-Prot-Amyl, (ZENPEP) 20000-63000 units CPEP Take 40,000 units of lipase by mouth 3 (three) times daily before meals. Take 2 capsules (40,000 units) with meals and 1 capsule (20,000 units) with a snack  . pantoprazole (PROTONIX) 40 MG tablet Take 1 tablet (40 mg total) by mouth 2 (two) times daily.  Vladimir Faster Glycol-Propyl Glycol (SYSTANE OP) Place 1 drop into both eyes daily. For dry eyes  . potassium chloride SA (KLOR-CON M20) 20 MEQ tablet Take 1 tablet (20 mEq total) by mouth 3 (three) times daily.  Marland Kitchen tiZANidine (ZANAFLEX) 4 MG tablet TAKE 1 TABLET BY MOUTH TWICE A DAY  . [DISCONTINUED] levofloxacin (LEVAQUIN) 500 MG tablet Take 1 tablet (500 mg total) by mouth daily.   Current Facility-Administered Medications for the 09/08/19 encounter (Office Visit) with Burtis Junes, NP  Medication  . ipratropium-albuterol (DUONEB) 0.5-2.5 (3) MG/3ML nebulizer solution 3 mL     Allergies: Allergies  Allergen Reactions  . Doxycycline Other (See Comments)    Unsteady gait  . Aspirin Nausea And Vomiting  . Azithromycin Other (See Comments)    REACTION: pt states "ZPak doesn't work"  . Caffeine Nausea And Vomiting  . Ciprofloxacin Nausea And Vomiting    Tolerates levaquin  . Codeine Nausea And Vomiting  . Oxycodone     Pt stated, "upsets my stomach" (11/19/17)  . Sulfamethizole Other (See Comments)    Unknown reaction  . Tramadol Hcl Other (See Comments)    REACTION: pt states "spaced-out"    Social History: The patient  reports that she quit smoking about 46 years ago. Her smoking use included cigarettes. She has a 30.00 pack-year smoking history. She has never used smokeless tobacco. She reports previous alcohol use. She reports that she does not use drugs.   Family History: [name] family history includes Breast cancer in  her daughter; Colon polyps in her father; Diabetes in her brother and sister;  Heart attack in her father; Hypertension in her sister; Parkinsonism in her mother; Pneumonia in her father; Sarcoidosis in her brother.   Review of Systems: Please see the history of present illness.   All other systems are reviewed and negative.   Physical Exam: VS:  BP 124/70   Pulse 64   Ht 5' (1.524 m)   Wt 207 lb 12.8 oz (94.3 kg)   LMP  (LMP Unknown)   SpO2 96%   BMI 40.58 kg/m  .  BMI Body mass index is 40.58 kg/m.  Wt Readings from Last 3 Encounters:  09/08/19 207 lb 12.8 oz (94.3 kg)  06/08/19 204 lb (92.5 kg)  05/13/19 203 lb 9.6 oz (92.4 kg)    General:  Alert and in no acute distress. Using a walker.  HEENT: Normal.  Neck: Supple, no JVD, carotid bruits, or masses noted.  Cardiac: Regular rate and rhythm. No murmurs, rubs, or gallops. Legs are full with chronic edema.  Respiratory:  Lungs are clear to auscultation bilaterally with normal work of breathing.  GI: Soft and nontender.  MS: No deformity or atrophy. Gait and ROM intact. Using a walker.  Skin: Warm and dry. Color is normal.  Neuro:  Strength and sensation are intact and no gross focal deficits noted.  Psych: Alert, appropriate and with normal affect.   LABORATORY DATA:  EKG:  EKG is not ordered today.   Lab Results  Component Value Date   WBC 12.7 (H) 02/25/2019   HGB 11.3 (L) 02/25/2019   HCT 34.8 (L) 02/25/2019   PLT 496.0 (H) 02/25/2019   GLUCOSE 93 02/25/2019   CHOL 152 02/06/2018   TRIG 117.0 02/06/2018   HDL 58.40 02/06/2018   LDLCALC 70 02/06/2018   ALT 7 02/25/2019   AST 12 02/25/2019   NA 139 02/25/2019   K 3.7 02/25/2019   CL 102 02/25/2019   CREATININE 0.52 02/25/2019   BUN 10 02/25/2019   CO2 27 02/25/2019   TSH 0.96 12/15/2018   INR 1.01 12/07/2011   HGBA1C 7.9 (H) 02/06/2018     BNP (last 3 results) No results for input(s): BNP in the last 8760 hours.  ProBNP (last 3 results) No results for input(s): PROBNP in the last 8760 hours.   Other Studies Reviewed  Today:  Myoview Study Highlights 11/2016   The left ventricular ejection fraction is normal (55-65%).  Nuclear stress EF: 65%.  There was no ST segment deviation noted during stress.  No T wave inversion was noted during stress.  Defect 1: There is a small defect of moderate severity present in the basal anterior and mid anterior location. This was consistent with artifact.  The study is normal.  This is a low risk study.       Assessment/Plan:  1. CAD - has had remote pLAD BMS in 2002 - has had 3 catheterizations since - last in 2013 - low risk Myoview from 2018. She has moderate disease otherwise that is treated medically - no worrisome symptoms. Needs labs updated. CV risk factor modification not ideal but this is chronic.   2. Aspirin intolerance - due to stomach issues - she is on chronic Plavix  3. HLD - labs today. Remains on statin therapy.   4. HTN - BP is fine here today - no changes made today.   5. Morbid obesity - challenging for her due to chronic pain.  6. UC/pancreatitis - followed by GI  7. Chronic diastolic HF - tends to have chronic swelling - some component of venous insufficiency - this is unchanged. Has good BP control.   8. DM - looks uncontrolled - per PCP  9. COVID-19 Education: The signs and symptoms of COVID-19 were discussed with the patient and how to seek care for testing (follow up with PCP or arrange E-visit).  The importance of social distancing, staying at home, hand hygiene and wearing a mask when out in public were discussed today.  Current medicines are reviewed with the patient today.  The patient does not have concerns regarding medicines other than what has been noted above.  The following changes have been made:  See above.  Labs/ tests ordered today include:    Orders Placed This Encounter  Procedures  . Basic metabolic panel  . CBC  . Hepatic function panel  . Lipid panel  . Hemoglobin A1c     Disposition:   FU  with Dr. Marlou Porch in 6 months.    Patient is agreeable to this plan and will call if any problems develop in the interim.   SignedTruitt Merle, NP  09/08/2019 11:29 AM  [facility] [address] [place], [place]  [address] Phone: [phone] Fax: [phone]

## 2019-09-04 NOTE — Addendum Note (Signed)
Addended by: Alysia Penna A on: 09/04/2019 07:47 AM   Modules accepted: Orders

## 2019-09-08 ENCOUNTER — Other Ambulatory Visit: Payer: Self-pay

## 2019-09-08 ENCOUNTER — Encounter: Payer: Self-pay | Admitting: Nurse Practitioner

## 2019-09-08 ENCOUNTER — Ambulatory Visit (INDEPENDENT_AMBULATORY_CARE_PROVIDER_SITE_OTHER): Payer: Medicare Other | Admitting: Nurse Practitioner

## 2019-09-08 VITALS — BP 124/70 | HR 64 | Ht 60.0 in | Wt 207.8 lb

## 2019-09-08 DIAGNOSIS — Z8639 Personal history of other endocrine, nutritional and metabolic disease: Secondary | ICD-10-CM | POA: Diagnosis not present

## 2019-09-08 DIAGNOSIS — E7800 Pure hypercholesterolemia, unspecified: Secondary | ICD-10-CM

## 2019-09-08 DIAGNOSIS — I2583 Coronary atherosclerosis due to lipid rich plaque: Secondary | ICD-10-CM

## 2019-09-08 DIAGNOSIS — I10 Essential (primary) hypertension: Secondary | ICD-10-CM | POA: Diagnosis not present

## 2019-09-08 DIAGNOSIS — I2510 Atherosclerotic heart disease of native coronary artery without angina pectoris: Secondary | ICD-10-CM

## 2019-09-08 DIAGNOSIS — I5032 Chronic diastolic (congestive) heart failure: Secondary | ICD-10-CM

## 2019-09-08 NOTE — Patient Instructions (Addendum)
After Visit Summary:  We will be checking the following labs today - BMET, CBC, HPF, Lipids and AIC   Medication Instructions:    Continue with your current medicines.    If you need a refill on your cardiac medications before your next appointment, please call your pharmacy.     Testing/Procedures To Be Arranged:  N/A  Follow-Up:   See Dr. Marlou Porch in 6 months -  You will receive a reminder letter in the mail two months in advance. If you don't receive a letter, please call our office to schedule the follow-up appointment.     At [facility], you and your health needs are our priority.  As part of our continuing mission to provide you with exceptional heart care, we have created designated Provider Care Teams.  These Care Teams include your primary Cardiologist (physician) and Advanced Practice Providers (APPs -  Physician Assistants and Nurse Practitioners) who all work together to provide you with the care you need, when you need it.  Special Instructions:  . Stay safe, stay home, wash your hands for at least 20 seconds and wear a mask when out in public.  . It was good to talk with you both today.    Call the [place] office at [phone] if you have any questions, problems or concerns.

## 2019-09-09 ENCOUNTER — Telehealth (INDEPENDENT_AMBULATORY_CARE_PROVIDER_SITE_OTHER): Payer: Medicare Other | Admitting: Family Medicine

## 2019-09-09 ENCOUNTER — Encounter: Payer: Self-pay | Admitting: Family Medicine

## 2019-09-09 ENCOUNTER — Telehealth: Payer: Self-pay | Admitting: Family Medicine

## 2019-09-09 DIAGNOSIS — M5442 Lumbago with sciatica, left side: Secondary | ICD-10-CM

## 2019-09-09 DIAGNOSIS — G8929 Other chronic pain: Secondary | ICD-10-CM | POA: Diagnosis not present

## 2019-09-09 DIAGNOSIS — I2583 Coronary atherosclerosis due to lipid rich plaque: Secondary | ICD-10-CM | POA: Diagnosis not present

## 2019-09-09 DIAGNOSIS — F1190 Opioid use, unspecified, uncomplicated: Secondary | ICD-10-CM | POA: Diagnosis not present

## 2019-09-09 DIAGNOSIS — M5441 Lumbago with sciatica, right side: Secondary | ICD-10-CM | POA: Diagnosis not present

## 2019-09-09 DIAGNOSIS — I2510 Atherosclerotic heart disease of native coronary artery without angina pectoris: Secondary | ICD-10-CM | POA: Diagnosis not present

## 2019-09-09 LAB — CBC
Hematocrit: 38 % (ref 34.0–46.6)
Hemoglobin: 12.5 g/dL (ref 11.1–15.9)
MCH: 30.1 pg (ref 26.6–33.0)
MCHC: 32.9 g/dL (ref 31.5–35.7)
MCV: 92 fL (ref 79–97)
Platelets: 462 10*3/uL — ABNORMAL HIGH (ref 150–450)
RBC: 4.15 x10E6/uL (ref 3.77–5.28)
RDW: 12.1 % (ref 11.7–15.4)
WBC: 8.9 10*3/uL (ref 3.4–10.8)

## 2019-09-09 LAB — BASIC METABOLIC PANEL
BUN/Creatinine Ratio: 27 (ref 12–28)
BUN: 16 mg/dL (ref 8–27)
CO2: 24 mmol/L (ref 20–29)
Calcium: 9.7 mg/dL (ref 8.7–10.3)
Chloride: 103 mmol/L (ref 96–106)
Creatinine, Ser: 0.59 mg/dL (ref 0.57–1.00)
GFR calc Af Amer: 102 mL/min/{1.73_m2} (ref >59)
GFR calc non Af Amer: 89 mL/min/{1.73_m2} (ref >59)
Glucose: 129 mg/dL — ABNORMAL HIGH (ref 65–99)
Potassium: 4.7 mmol/L (ref 3.5–5.2)
Sodium: 142 mmol/L (ref 134–144)

## 2019-09-09 LAB — LIPID PANEL
Chol/HDL Ratio: 2.6 ratio (ref 0.0–4.4)
Cholesterol, Total: 161 mg/dL (ref 100–199)
HDL: 62 mg/dL (ref >39)
LDL Chol Calc (NIH): 82 mg/dL (ref 0–99)
Triglycerides: 91 mg/dL (ref 0–149)
VLDL Cholesterol Cal: 17 mg/dL (ref 5–40)

## 2019-09-09 LAB — HEMOGLOBIN A1C
Est. average glucose Bld gHb Est-mCnc: 143 mg/dL
Hgb A1c MFr Bld: 6.6 % — ABNORMAL HIGH (ref 4.8–5.6)

## 2019-09-09 LAB — HEPATIC FUNCTION PANEL
ALT: 5 IU/L (ref 0–32)
AST: 13 IU/L (ref 0–40)
Albumin: 4.7 g/dL (ref 3.7–4.7)
Alkaline Phosphatase: 73 IU/L (ref 39–117)
Bilirubin Total: 0.4 mg/dL (ref 0.0–1.2)
Bilirubin, Direct: 0.14 mg/dL (ref 0.00–0.40)
Total Protein: 7.4 g/dL (ref 6.0–8.5)

## 2019-09-09 MED ORDER — HYDROCODONE-ACETAMINOPHEN 10-325 MG PO TABS: 1.0000 | ORAL_TABLET | Freq: Four times a day (QID) | ORAL | 0 refills | Status: AC | PRN

## 2019-09-09 MED ORDER — HYDROCODONE-ACETAMINOPHEN 10-325 MG PO TABS: 1.0000 | ORAL_TABLET | Freq: Four times a day (QID) | ORAL | 0 refills | Status: DC | PRN

## 2019-09-09 NOTE — Progress Notes (Signed)
   Subjective:    Patient ID: [name], female    DOB: 01/20/1941, 79 y.o.   MRN: [id number]  HPI Virtual Visit via Telephone Note  I connected with the patient on 09/09/19 at  2:00 PM EDT by telephone and verified that I am speaking with the correct person using two identifiers.   I discussed the limitations, risks, security and privacy concerns of performing an evaluation and management service by telephone and the availability of in person appointments. I also discussed with the patient that there may be a patient responsible charge related to this service. The patient expressed understanding and agreed to proceed.  Location patient: home Location provider: work or home office Participants present for the call: patient, provider Patient did not have a visit in the prior 7 days to address this/these issue(s).   History of Present Illness: Here for pain management, she is doing well.  Indication for chronic opioid: low back pain Medication and dose: Norco 10-325 # pills per month: 120 Last UDS date: 10-28-18 Opioid Treatment Agreement signed (Y/N): 10-28-18 Opioid Treatment Agreement last reviewed with patient:  09-09-19 N[facility] reviewed this encounter (include red flags): Yes    Observations/Objective: Patient sounds cheerful and well on the phone. I do not appreciate any SOB. Speech and thought processing are grossly intact. Patient reported vitals:  Assessment and Plan: Pain management, meds were refilled.  Alysia Penna, MD   Follow Up Instructions:     [phone] [phone] 11-20 [phone] 21-30 I did not refer this patient for an OV in the next 24 hours for this/these issue(s).  I discussed the assessment and treatment plan with the patient. The patient was provided an opportunity to ask questions and all were answered. The patient agreed with the plan and demonstrated an understanding of the instructions.   The patient was advised to call back or seek an in-person  evaluation if the symptoms worsen or if the condition fails to improve as anticipated.  I provided 14 minutes of non-face-to-face time during this encounter.   Alysia Penna, MD    Review of Systems     Objective:   Physical Exam        Assessment & Plan:

## 2019-09-09 NOTE — Telephone Encounter (Signed)
error

## 2019-09-09 NOTE — Addendum Note (Signed)
Addended by: Alysia Penna A on: 09/09/2019 02:33 PM   Modules accepted: Orders

## 2019-09-10 ENCOUNTER — Ambulatory Visit: Payer: Medicare Other

## 2019-09-10 ENCOUNTER — Telehealth: Payer: Self-pay | Admitting: Nurse Practitioner

## 2019-09-10 ENCOUNTER — Other Ambulatory Visit: Payer: Self-pay

## 2019-09-10 DIAGNOSIS — E782 Mixed hyperlipidemia: Secondary | ICD-10-CM

## 2019-09-10 DIAGNOSIS — E119 Type 2 diabetes mellitus without complications: Secondary | ICD-10-CM

## 2019-09-10 DIAGNOSIS — I5032 Chronic diastolic (congestive) heart failure: Secondary | ICD-10-CM

## 2019-09-10 DIAGNOSIS — E785 Hyperlipidemia, unspecified: Secondary | ICD-10-CM

## 2019-09-10 DIAGNOSIS — I10 Essential (primary) hypertension: Secondary | ICD-10-CM

## 2019-09-10 DIAGNOSIS — J45901 Unspecified asthma with (acute) exacerbation: Secondary | ICD-10-CM

## 2019-09-10 MED ORDER — ATORVASTATIN CALCIUM 20 MG PO TABS: 20.0000 mg | ORAL_TABLET | Freq: Every day | ORAL | 3 refills | Status: DC

## 2019-09-10 NOTE — Telephone Encounter (Signed)
Called and spoke to the patient and explained that since she has CAD and a prior stent that she is at higher risk for future cardiac event or stroke. Made her aware that her LDL goal is to be less than 70 in order to decrease this risk. She verbalized understanding and agrees to increase her atorvastatin to 20 mg QD. Rx sent to preferred pharmacy and fasting LIPIDS and LFTs scheduled for 12/10/19.      Burtis Junes, NP  09/09/2019 7:31 AM EDT    Ok to report. Labs are stable - but lipids not at goal - needs lower LDL with goal < 70 - increase Lipitor to 20 mg a day - recheck lipids and LFTs in 3 months and otherwise, would continue on current regimen.

## 2019-09-10 NOTE — Chronic Care Management (AMB) (Signed)
Chronic Care Management Pharmacy  Name: [name]  MRN: [id number] DOB: 1940-10-09   Initial Questions: 1. Have you seen any other providers since your last visit? NA 2. Any changes in your medicines or health? No   Chief Complaint/ HPI  [name],  79 y.o. , female presents for their Initial CCM visit with the clinical pharmacist via telephone due to COVID-19 Pandemic.  Patient reported motivation to improve her health is due to father having several heart attacks and sister having heart surgeries.    PCP : [name], MD  Their chronic conditions include: asthma,  DM, diastolic HF, HTN, HLD, CAD, Allergies, Back pain, Anxiety, GERD, UC, history of pancreatic insufficiency, Chronic bilateral low back pain, vitamin D deficiency, anemia   Office Visits: 09/09/2019- Alysia Penna, MD- patient presented via virtual visit for pain management. Patient reported doing well and is on chronic opioids for low back pain. Hydrocodone/ APAP 10/364m, 1 tablet every six hours as needed refilled.   05/13/2019- SAlysia Penna MD- patient presented for office visit for pain management for low back pain. Patient to see orthopedist, Dr. RWandra Feinsteintomorrow. Norco 10/3273mrefilled.   Consult Visit: 09/08/2019- Cardiology- LoTruitt MerleNP- Patient presented for office visit for 7 month check up. Patient needed updated labs. No medication changes. Patient to follow up in 6 months with Dr. SkMarlou Porch  06/05/2019- Neurology- AdMetta ClinesDP- patient presented for office visit for spinal cord lesion. Abnormal cervical spinal cord MRI suspicious for demyelinating disease and sensory deficits in fingers. Plan: MRI of brain with and without contrast, MRI of cervical spine with contrast. Then will schedule LP. Gabapentin titrated to 30086mhree times daily. Patient to follow up after testing.    05/12/2019- Infectious disease- RobScharlene GlossD- Patient presented for office visit for H.pylori infection.  Patient prescribed 14 days of Levaquin, amoxicillin, and PPI twice daily. Patient to follow up with Dr. StaFuller Planr further evaluations.   Medications: Outpatient Encounter Medications as of 09/10/2019  Medication Sig  . ACCU-CHEK FASTCLIX LANCETS MISC Test once per day and diagnosis code is E 11.9  . acetaminophen (TYLENOL) 500 MG tablet Take 500 mg by mouth every 6 (six) hours as needed for headache (pain).   . aMarland Kitchenbuterol (PROVENTIL) (2.5 MG/3ML) 0.083% nebulizer solution Take 3 mLs (2.5 mg total) by nebulization every 4 (four) hours as needed for wheezing or shortness of breath.  . aMarland Kitchenbuterol (VENTOLIN HFA) 108 (90 BASE) MCG/ACT inhaler Inhale 2 puffs into the lungs every 4 (four) hours as needed for wheezing or shortness of breath.  . ALPRAZolam (XANAX) 0.5 MG tablet TAKE 1 TABLET BY MOUTH THREE TIMES A DAY AS NEEDED  . amLODipine (NORVASC) 10 MG tablet TAKE 1 TABLET BY MOUTH EVERY DAY  . atenolol (TENORMIN) 50 MG tablet TAKE 1 TABLET BY MOUTH EVERY DAY  . azelastine (ASTELIN) 0.1 % nasal spray Place 1 spray into both nostrils at bedtime.  . budesonide-formoterol (SYMBICORT) 160-4.5 MCG/ACT inhaler Inhale 2 puffs into the lungs 2 (two) times daily.  . Cholecalciferol (VITAMIN D-3) 5000 UNITS TABS Take 5,000 Units by mouth daily.    . clopidogrel (PLAVIX) 75 MG tablet TAKE 1 TABLET BY MOUTH EVERY DAY  . diclofenac sodium (VOLTAREN) 1 % GEL As needed for pain  . diphenoxylate-atropine (LOMOTIL) 2.5-0.025 MG tablet TAKE 1 TABLET BY MOUTH THREE TIMES A DAY AS NEEDED FOR DIARRHEA OR LOOSE STOOLS  . Ferrous Sulfate (IRON) 325 (65 Fe) MG TABS Take 1 tablet (325  mg total) by mouth 2 (two) times daily. (Patient taking differently: Take 325 mg by mouth daily. )  . fluticasone (FLONASE) 50 MCG/ACT nasal spray PLACE 2 SPRAYS IN EACH NOSTRIL AT BEDTIME  . furosemide (LASIX) 40 MG tablet TAKE 1 TABLET (40 MG TOTAL) BY MOUTH 2 (TWO) TIMES DAILY.  Marland Kitchen gabapentin (NEURONTIN) 600 MG tablet Take 1 tablet (600 mg  total) by mouth 3 (three) times daily.  Marland Kitchen glimepiride (AMARYL) 1 MG tablet TAKE 1 TABLET BY MOUTH EVERY DAY BEFORE BREAKFAST  . glucose blood (ACCU-CHEK AVIVA PLUS) test strip TEST ONCE PER DAY AND DIAGNOSIS CODE IS E 11.9  . glycopyrrolate (ROBINUL) 1 MG tablet Take 1 tablet (1 mg total) by mouth 2 (two) times daily.  Derrill Memo ON 11/09/2019] HYDROcodone-acetaminophen (NORCO) 10-325 MG tablet Take 1 tablet by mouth every 6 (six) hours as needed.  . hydrOXYzine (ATARAX/VISTARIL) 25 MG tablet TAKE 1 TABLET EVERY 4 HOURS AS NEEDED FOR ITCHING  . isosorbide mononitrate (IMDUR) 60 MG 24 hr tablet Take 1 tablet (60 mg total) by mouth daily.  Marland Kitchen LIDODERM 5 % APPLY 1 PATCH TO SKIN FOR 12 HOURS THEN REMOVE AND DISCARD PATCH WITHIN 72 HOURS OR AS DIRECTED  . loperamide (IMODIUM) 2 MG capsule Take 4-6 mg by mouth daily.  Marland Kitchen losartan (COZAAR) 50 MG tablet Take 1 tablet (50 mg total) by mouth daily.  . magnesium oxide (MAG-OX) 400 MG tablet TAKE 1 TABLET BY MOUTH EVERY DAY  . mesalamine (LIALDA) 1.2 g EC tablet Take 2 tablets (2.4 g total) by mouth 2 (two) times daily.  . metFORMIN (GLUCOPHAGE) 1000 MG tablet TAKE 1 TABLET (1,000 MG TOTAL) BY MOUTH 2 (TWO) TIMES DAILY WITH A MEAL.  . methylPREDNISolone acetate (DEPO-MEDROL) 40 MG/ML injection SMARTSIG:2 Milliliter(s) IM Once PRN  . montelukast (SINGULAIR) 10 MG tablet Take one every morning  . nabumetone (RELAFEN) 750 MG tablet TAKE 1 TABLET BY MOUTH TWICE A DAY WITH MEAL  . nitroGLYCERIN (NITROSTAT) 0.4 MG SL tablet PLACE 1 TABLET (0.4 MG TOTAL) UNDER THE TONGUE EVERY 5 (FIVE) MINUTES AS NEEDED FOR CHEST PAIN.  Marland Kitchen Olopatadine HCl (PAZEO) 0.7 % SOLN Place 1 drop into both eyes daily as needed (itching/allergies).  . ondansetron (ZOFRAN ODT) 4 MG disintegrating tablet Take 1 tablet (4 mg total) by mouth every 8 (eight) hours as needed for nausea or vomiting.  . Pancrelipase, Lip-Prot-Amyl, (ZENPEP) 20000-63000 units CPEP Take 40,000 units of lipase by mouth 3  (three) times daily before meals. Take 2 capsules (40,000 units) with meals and 1 capsule (20,000 units) with a snack  . pantoprazole (PROTONIX) 40 MG tablet Take 1 tablet (40 mg total) by mouth 2 (two) times daily.  Vladimir Faster Glycol-Propyl Glycol (SYSTANE OP) Place 1 drop into both eyes daily. For dry eyes  . potassium chloride SA (KLOR-CON M20) 20 MEQ tablet Take 1 tablet (20 mEq total) by mouth 3 (three) times daily.  Marland Kitchen tiZANidine (ZANAFLEX) 4 MG tablet TAKE 1 TABLET BY MOUTH TWICE A DAY  . [DISCONTINUED] atorvastatin (LIPITOR) 10 MG tablet TAKE 1 TABLET BY MOUTH EVERY DAY  . amoxicillin (AMOXIL) 500 MG capsule Take 2 capsules (1,000 mg total) by mouth 2 (two) times daily. (Patient not taking: Reported on 09/10/2019)  . clotrimazole (LOTRIMIN) 1 % external solution Apply 1 application topically 2 (two) times daily. In between toes (Patient not taking: Reported on 09/10/2019)  . diclofenac Sodium (VOLTAREN) 1 % GEL   . gabapentin (NEURONTIN) 300 MG capsule Take 1 capsule (  300 mg total) by mouth 3 (three) times daily. (Patient not taking: Reported on 09/10/2019)  . HYDROcodone-homatropine (HYDROMET) 5-1.5 MG/5ML syrup Take 5 mLs by mouth every 4 (four) hours as needed. (Patient not taking: Reported on 09/10/2019)  . hydrOXYzine (VISTARIL) 25 MG capsule TAKE 1 CAPSULE (25 MG TOTAL) BY MOUTH EVERY 6 (SIX) HOURS AS NEEDED. (Patient not taking: Reported on 09/10/2019)  . ketoconazole (NIZORAL) 2 % cream Apply 1 application topically daily. (Patient not taking: Reported on 09/10/2019)  . levocetirizine (XYZAL) 5 MG tablet TAKE 1 TABLET BY MOUTH EVERY DAY IN THE EVENING (Patient not taking: Reported on 09/10/2019)  . neomycin-polymyxin-hydrocortisone (CORTISPORIN) otic solution Use 1 drop in affected ear three times daily as needed (Patient not taking: Reported on 09/10/2019)  . ondansetron (ZOFRAN) 4 MG tablet Take 1 tablet (4 mg total) by mouth every 6 (six) hours as needed for nausea or vomiting. (Patient not  taking: Reported on 09/10/2019)   Facility-Administered Encounter Medications as of 09/10/2019  Medication  . ipratropium-albuterol (DUONEB) 0.5-2.5 (3) MG/3ML nebulizer solution 3 mL     Current Diagnosis/Assessment:  Goals Addressed            This Visit's Progress   . Pharmacy Care Plan       CARE PLAN ENTRY  Current Barriers:  . Chronic Disease Management support, education, and care coordination needs related to Hypertension, Hyperlipidemia, Diabetes, Heart Failure, and Asthma   Hypertension . Pharmacist Clinical Goal(s): o Over the next 180 days, patient will work with PharmD and providers to maintain BP goal <130/80 . Current regimen:   Amlodipine 73m, 1 tablet once daily  Atenolol 545m 1 tablet once daily  Losartan 5081m1 tablet once daily  . Interventions: . Discussed diet modifications. DASH diet:  following a diet emphasizing fruits and vegetables and low-fat dairy products along with whole grains, fish, poultry, and nuts. Reducing red meats and sugars.  . Patient self care activities - Over the next 180 days, patient will: o Check BP at home, document, and provide at future appointments  Hyperlipidemia . Pharmacist Clinical Goal(s): o Over the next 180  days, patient will work with PharmD and providers to achieve LDL goal < 70 . Current regimen:  o Atorvastatin 11m48m tablet every evening  . Interventions: o Discuss with cardiology office of increase dose of atorvastatin to 20mg75mPatMarland Kitchenent self care activities - Over the next 180 days, patient will: o Take medications as directed.   Diabetes . Pharmacist Clinical Goal(s): o Over the next 180 days, patient will work with PharmD and providers to maintain A1c goal <7% . Current regimen:   Glimepiride 1mg, 18mablet daily before breakfast  Metformin 1000mg, 46mblet twice daily with a meal . Interventions: o We discussed signs and symptoms of low blood sugars and how to treat.  . Patient self care  activities - Over the next 180 days, patient will: o Check blood sugar as directed document, and provide at future appointments o Contact provider with any episodes of hypoglycemia  Heart failure . Pharmacist Clinical Goal(s) o Over the next 180 days, patient will work with PharmD and providers to prevent hospitalizations.  . Current regimen:  o Furosemide 40mg, 162mlet twice daily  . Interventions: o We discussed weighing daily; if you gain more than 3 pounds in one day or 5 pounds in one week call your doctor . Patient self care activities - Over the next 180 days, patient will: o Continue current medications.  Asthma . Pharmacist Clinical Goal(s) o Over the next 180 days, patient will work with PharmD and providers to prevent worsening of shortness of breath and hospitalizations. . Current regimen:   Albuterol 0.083% nebulizer solution- use 1 vial by nebulization every four hours as needed for wheezing or shortness of breath  Albuterol (Ventolin HFA) inhaler, inhale 2 puffs every four hours as needed for wheezing or shortness of breath  Budesonide/ formoterol (Symbicort) 106/4.32mg/ act inhaler, inhale 2 puffs twice daily . Interventions: o We discussed proper inhaler technique and rinsing mouth and spitting after each use of Symbicort.  . Patient self care activities o Patient will continue current medications.    Medication management . Pharmacist Clinical Goal(s): o Over the next 180 days, patient will work with PharmD and providers to achieve optimal medication adherence . Current pharmacy: [facility]  . Interventions o Comprehensive medication review performed. o Continue current medication management strategy . Patient self care activities - Over the next 180 days, patient will: o Take medications as prescribed o Report any questions or concerns to PharmD and/or provider(s)  Initial goal documentation       SDOH Interventions     Most Recent Value  SDOH  Interventions  Financial Strain Interventions  Intervention Not Indicated  Transportation Interventions  Intervention Not Indicated       Asthma    Eosinophil count:   Lab Results  Component Value Date/Time   EOSPCT 6.3 (H) 02/25/2019 10:16 AM  %                               Eos (Absolute):  Lab Results  Component Value Date/Time   EOSABS 0.8 (H) 02/25/2019 10:16 AM   Tobacco Status:  Social History   Tobacco Use  Smoking Status Former Smoker  . Packs/day: 2.00  . Years: 15.00  . Pack years: 30.00  . Types: Cigarettes  . Quit date: 04/30/1973  . Years since quitting: 46.4  Smokeless Tobacco Never Used  Tobacco Comment   ex-smoker: smoked for 10-15 years up to 2ppd, quit 1975.    Patient has failed these meds in past: none  Patient is currently controlled on the following medications:   Albuterol 0.083% nebulizer solution- use 1 vial by nebulization every four hours as needed for wheezing or shortness of breath  Albuterol (Ventolin HFA) inhaler, inhale 2 puffs every four hours as needed for wheezing or shortness of breath  Budesonide/ formoterol (Symbicort) 106/4.540m/ act inhaler, inhale 2 puffs twice daily  Using maintenance inhaler regularly? Yes  - patient reports using Symbicort daily (most days once daily, but will do second dose in the afternoon if needed).   Frequency of rescue inhaler use:  prn  - reports as needed for cough (about once a day for albuterol nebs)  We discussed:  proper inhaler technique and rinsing mouth after each use of Symbicort.  - we discussed using Symbicort as indicated to decrease need for albuterol nebs.   Plan Continue current medications   Diabetes  Patient reports not eating as much as before and eating better food choices.   Recent Relevant Labs: Lab Results  Component Value Date/Time   HGBA1C 6.6 (H) 09/08/2019 11:34 AM   HGBA1C 7.9 (H) 02/06/2018 10:03 AM    Checking BG: Daily  Recent FBG Readings: unable to  report numbers.   Patient has failed these meds in past: none  Patient is currently controlled on the  following medications:   Glimepiride 108m, 1 tablet daily before breakfast  Metformin 10066m 1 tablet twice daily with a meal  Last diabetic Foot exam:  Lab Results  Component Value Date/Time   HMDIABEYEEXA No Retinopathy 08/20/2019 12:00 AM   - patient reported obtain eye exams yearly      Last diabetic Foot exam: No results found for: HMDIABFOOTEX  - denies neuropathy  We discussed: diet and exercise extensively and how to recognize and treat signs of hypoglycemia  Plan Continue current medications   Heart Failure  Patient reported no swelling.   Type: Diastolic  Last ejection fraction: 55% - 60% (12/27/2016)   Patient has failed these meds in past: none  Patient is currently controlled on the following medications:  Furosemide 4050m1 tablet twice daily   Potassium supplementation:   Potassium chloride 52m11m1 tablet three times daily (patient reports taking twice daily, patient reports it was increased due to cramps). Will go back to TID this week  Potassium: 4.7 (09/08/2019)   We discussed weighing daily; if you gain more than 3 pounds in one day or 5 pounds in one week call your doctor  Plan Continue current medications   Hypertension  Denies dizziness/lightheadedness / headache   BP today is:  <130/80  Office blood pressures are  BP Readings from Last 3 Encounters:  09/08/19 124/70  06/08/19 (!) 167/84  05/13/19 140/80   Patient has failed these meds in the past: none   Patient checks BP at home daily  Patient home BP readings are ranging: does not remember readings, but states readings are doing "good"   Patient is controlled on (based on recent OV BP):   Amlodipine 10mg63mtablet once daily  Atenolol 50mg,53mablet once daily  Losartan 50mg, 67mblet once daily   We discussed diet and exercise extensively  Plan Continue current  medications   Hyperlipidemia   Lipid Panel     Component Value Date/Time   CHOL 161 09/08/2019 1134   TRIG 91 09/08/2019 1134   HDL 62 09/08/2019 1134   CHOLHDL 2.6 09/08/2019 1134   CHOLHDL 3 02/06/2018 1003   VLDL 23.4 02/06/2018 1003   LDLCALCBlackwater11/2021 1134   LABVLDL 17 09/08/2019 1134    The 10-year ASCVD risk score (Goff DMikey Bussing, et al., 2013) is: 30.7%   Values used to calculate the score:     Age: 68 year49    Sex: Female     Is Non-Hispanic African American: Yes     Diabetic: Yes     Tobacco smoker: No     Systolic Blood Pressure: 124 mmH030   Is BP treated: Yes     HDL Cholesterol: 62 mg/dL     Total Cholesterol: 161 mg/dL   Patient has failed these meds in past: none   Patient is currently controlled on the following medications:  Atorvastatin 10mg, 175mlet every evening   We discussed: diet and exercise extensively  - discussed increase in statin intensity to meet LDL goal<70 due to CAD history.   Plan Patient to follow up with cardiology office on statin increase.  Continue current medications  CAD  Reports not using nitroglycerin recently. Last use of nitroglycerin "months ago".    Patient has failed these meds in past: aspirin (nausea and vomiting)   Patient is currently controlled on the following medications:  Isosorbide mononitrate 60mg, 1 11met once daily   Nitroglycerin 0.4mg SL, 171mblet under tongue  every 5 minutes as needed for chest pain (maximum: 3 doses/ day)  Clopidogrel (Plavix) 7m, 1 tablet once daily  Plan Continue current medications  Allergies   Patient is currently controlled on the following medications:   Azelastine (Astelin) 0.1% nasal spray, 1 spray into both nostrils at bedtime  Fluticasone (Flonase) 549m/ act nasal spray, 2 sprays into each nostrils at bedtime  levocetirizine (Xyzal) 70m670m1 tablet daily in the evening (not sure talking)   Montelukast (Singulair) 68m68m tablet every morning    Olopatadine (Pazeo) 0.7% solution, 1 drop into both eyes daily as needed (taking Pataday)   Systane, 1 drop into both eyes daily for dry eyes   Plan Continue current medications  Pain  Patient states having pain all day and can hardly walk anymore. She depends using a walker or cane. She reports being on nabumetone for about 20 years and notes drowsiness of gabapentin. She notes being careful if she is planning to drive.    Kidney Function Lab Results  Component Value Date/Time   CREATININE 0.59 09/08/2019 11:34 AM   CREATININE 0.52 02/25/2019 10:16 AM   GFR 138.51 02/25/2019 10:16 AM   GFRNONAA 89 09/08/2019 11:34 AM   GFRAA 102 09/08/2019 11:34 AM     Patient is currently controlled on the following medications:  Acetaminophen 500mg28mtablet every six hours as needed for headache (reports taking 2 tablets in the morning)   Hydrocodone/ acetaminophen 10-3270mg,54mablet every six hours as needed  Nabumetone 750mg, 66mblet twice daily with a meal   Plan Continue current medications  Anxiety   Patient reports taking once daily (for anxiety)   Patient has failed these meds in past: none  Patient is currently controlled on the following medications:  Alprazolam 0.70mg, 1 39mlet three times daily as needed   We discussed:  caution intake concomitantly with other CNS depressants (hydrocodone/APAP)   Plan Continue current medications  GERD  Patient reports having several problems with GI (including irritable bowels)   Patient has failed these meds in past: none  Patient is currently controlled on the following medications:   Pantoprazole 40mg, 1 61met twice daily   Plan Continue current medications  UC  Has been in remission about 3 months. Patient states has to be careful what she eats. Will use 2 depends and plus underwear.  Sees Dr. Stark.   Fuller Plannt is currently on the following medications:   Diphenoxylate-atropine 2.5-0.0270mg, 1 tablet three times  daily as needed for diarrhea or loose stools  Glycopyrrolate 1mg, 1 ta33mt twice daily  Loperamide 2mg, 4 to 52m once da49m   Mesalamine (Lialda) 1.2g, 2 tablets twice daily   Plan Continue current medications  History of pancreatic insufficiency   Patient is currently controlled on the following medications:   Pancrelipase (Zenpep) 20,000-63,000 units,  40,000 units three times daily before meals and 1 capsule with a snack.  Plan Continue current medications  Chronic bilateral low back pain   Patient reports also getting epideral shots .  Patient likes to walk, but unable to walk as much anymore. Father had this and her children are beginning to get same pain.   Patient is currently controlled on the following medications:   Gabapentin 600mg, 1 caps11mthree times daily  Tizanidine 4mg, 1 tablet69mice daily (for muscle spasms)   Diclofenac 1% gel, apply as needed for pain- uses on spine/ back  Lidoderm 5% patch, apply 1 patch for 12 hours then remove and discard patch  within 72 hours or as directed (currently does not have some- will use as needed)   Plan Continue current medications  Vitamin D deficiency     Vit D, 25-Hydroxy  Date Value Ref Range Status  08/11/2013 29 (L) 30 - 89 ng/mL Final    Comment:    This assay accurately quantifies Vitamin D, which is the sum of the 25-Hydroxy forms of Vitamin D2 and D3.  Studies have shown that the optimum concentration of 25-Hydroxy Vitamin D is 30 ng/mL or higher.  Concentrations of Vitamin D between 20 and 29 ng/mL are considered to be insufficient and concentrations less than 20 ng/mL are considered to be deficient for Vitamin D.     Patient is currently on the following medications:   Cholecalciferol (vitamin D), 5000 units, 1 tablet once daily   Plan Recommend vitamin D level at next physical.  Continue current medications.  Anemia    Patient is currently on the following medications:   Ferrous  sulfate 339m, 1 tablet twice daily   Hemoglobin & Hematocrit     Component Value Date/Time   HGB 12.5 09/08/2019 1134   HCT 38.0 09/08/2019 1134   Plan Continue current medications    Medication Management  Patient organizes medications: has box where she has her pill bottles (tried pill box but wasn't able to use it).  [facility] Adherence:   Symbicort (last filled 05/22/19 for 30DS)  Reports only using at times once daily.   Glimepiride 145m (last filled 05/23/19 for 90DS)  Reports still has some and is about due. She plans to refill it soon    Pantoprazole 4074mlast  Filled 06/08/19 for 90DS)   Metformin 1000m62mast filled 05/23/19 for 90DS)   Reports still has some and is about due. She plans to refill it soon    Follow up Follow up visit with PharmD in 6 months. Will conduct general telephone calls for periodic check-ins before next visit.    AnneAnson CroftsarmD Clinical Pharmacist [facility] [phone]

## 2019-09-10 NOTE — Telephone Encounter (Signed)
Pt called back to discuss lab results

## 2019-09-10 NOTE — Telephone Encounter (Signed)
Returned call to Pt.  Advised of lab results.  Advised LG would like to increase her statin to meet goal LDL of <70.  Pt states she just saw her PCP yesterday who read off her labs to her and told her she was within normal limits and should not change anything.  Pt was willing to consider med change, just seemed a little confused by differing advice.  Will have LG or nurse call to discuss further.

## 2019-09-10 NOTE — Telephone Encounter (Signed)
Please call back,  Her labs do look ok - but with known CAD and prior stent - her goal LDL is less than 70.   Would still try to increase statin if she is agreeable and recheck as planned.   I am out of the office until Monday. Can call her back then myself if needed.   Thanks, Cecille Rubin

## 2019-09-21 NOTE — Patient Instructions (Addendum)
Visit Information  Goals Addressed            This Visit's Progress   . Pharmacy Care Plan       CARE PLAN ENTRY  Current Barriers:  . Chronic Disease Management support, education, and care coordination needs related to Hypertension, Hyperlipidemia, Diabetes, Heart Failure, and Asthma   Hypertension . Pharmacist Clinical Goal(s): o Over the next 180 days, patient will work with PharmD and providers to maintain BP goal <130/80 . Current regimen:   Amlodipine 31m, 1 tablet once daily  Atenolol 532m 1 tablet once daily  Losartan 5087m1 tablet once daily  . Interventions: . Discussed diet modifications. DASH diet:  following a diet emphasizing fruits and vegetables and low-fat dairy products along with whole grains, fish, poultry, and nuts. Reducing red meats and sugars.  . Patient self care activities - Over the next 180 days, patient will: o Check BP at home, document, and provide at future appointments  Hyperlipidemia . Pharmacist Clinical Goal(s): o Over the next 180  days, patient will work with PharmD and providers to achieve LDL goal < 70 . Current regimen:  o Atorvastatin 33m44m tablet every evening  . Interventions: o Discuss with cardiology office of increase dose of atorvastatin to 20mg34mPatMarland Kitchenent self care activities - Over the next 180 days, patient will: o Take medications as directed.   Diabetes . Pharmacist Clinical Goal(s): o Over the next 180 days, patient will work with PharmD and providers to maintain A1c goal <7% . Current regimen:   Glimepiride 1mg, 71mablet daily before breakfast  Metformin 1000mg, 87mblet twice daily with a meal . Interventions: o We discussed signs and symptoms of low blood sugars and how to treat.  . Patient self care activities - Over the next 180 days, patient will: o Check blood sugar as directed document, and provide at future appointments o Contact provider with any episodes of hypoglycemia  Heart  failure . Pharmacist Clinical Goal(s) o Over the next 180 days, patient will work with PharmD and providers to prevent hospitalizations.  . Current regimen:  o Furosemide 40mg, 165mlet twice daily  . Interventions: o We discussed weighing daily; if you gain more than 3 pounds in one day or 5 pounds in one week call your doctor . Patient self care activities - Over the next 180 days, patient will: o Continue current medications.   Asthma . Pharmacist Clinical Goal(s) o Over the next 180 days, patient will work with PharmD and providers to prevent worsening of shortness of breath and hospitalizations. . Current regimen:   Albuterol 0.083% nebulizer solution- use 1 vial by nebulization every four hours as needed for wheezing or shortness of breath  Albuterol (Ventolin HFA) inhaler, inhale 2 puffs every four hours as needed for wheezing or shortness of breath  Budesonide/ formoterol (Symbicort) 106/4.5mcg/ ac68mnhaler, inhale 2 puffs twice daily . Interventions: o We discussed proper inhaler technique and rinsing mouth and spitting after each use of Symbicort.  . Patient self care activities o Patient will continue current medications.    Medication management . Pharmacist Clinical Goal(s): o Over the next 180 days, patient will work with PharmD and providers to achieve optimal medication adherence . Current pharmacy: [facility]  . Interventions o Comprehensive medication review performed. o Continue current medication management strategy . Patient self care activities - Over the next 180 days, patient will: o Take medications as prescribed o Report any questions or concerns to PharmD  and/or provider(s)  Initial goal documentation        Ms. Demeter was given information about Chronic Care Management services today including:  1. CCM service includes personalized support from designated clinical staff supervised by her physician, including individualized plan of care and  coordination with other care providers 2. 24/7 contact phone numbers for assistance for urgent and routine care needs. 3. Standard insurance, coinsurance, copays and deductibles apply for chronic care management only during months in which we provide at least 20 minutes of these services. Most insurances cover these services at 100%, however patients may be responsible for any copay, coinsurance and/or deductible if applicable. This service may help you avoid the need for more expensive face-to-face services. 4. Only one practitioner may furnish and bill the service in a calendar month. 5. The patient may stop CCM services at any time (effective at the end of the month) by phone call to the office staff.  Patient agreed to services and verbal consent obtained.   The patient verbalized understanding of instructions provided today and agreed to receive a mailed copy of patient instruction and/or educational materials. Telephone follow up appointment with pharmacy team member scheduled for: 02/16/2020  Anson Crofts, PharmD Clinical Pharmacist [facility] [place] [phone]    Dyslipidemia Dyslipidemia is an imbalance of waxy, fat-like substances (lipids) in the blood. The body needs lipids in small amounts. Dyslipidemia often involves a high level of cholesterol or triglycerides, which are types of lipids. Common forms of dyslipidemia include:  High levels of LDL cholesterol. LDL is the type of cholesterol that causes fatty deposits (plaques) to build up in the blood vessels that carry blood away from your heart (arteries).  Low levels of HDL cholesterol. HDL cholesterol is the type of cholesterol that protects against heart disease. High levels of HDL remove the LDL buildup from arteries.  High levels of triglycerides. Triglycerides are a fatty substance in the blood that is linked to a buildup of plaques in the arteries. What are the causes? Primary dyslipidemia  is caused by changes (mutations) in genes that are passed down through families (inherited). These mutations cause several types of dyslipidemia. Secondary dyslipidemia is caused by lifestyle choices and diseases that lead to dyslipidemia, such as:  Eating a diet that is high in animal fat.  Not getting enough exercise.  Having diabetes, kidney disease, liver disease, or thyroid disease.  Drinking large amounts of alcohol.  Using certain medicines. What increases the risk? You are more likely to develop this condition if you are an older man or if you are a woman who has gone through menopause. Other risk factors include:  Having a family history of dyslipidemia.  Taking certain medicines, including birth control pills, steroids, some diuretics, and beta-blockers.  Smoking cigarettes.  Eating a high-fat diet.  Having certain medical conditions such as diabetes, polycystic ovary syndrome (PCOS), kidney disease, liver disease, or hypothyroidism.  Not exercising regularly.  Being overweight or obese with too much belly fat. What are the signs or symptoms? In most cases, dyslipidemia does not usually cause any symptoms. In severe cases, very high lipid levels can cause:  Fatty bumps under the skin (xanthomas).  White or gray ring around the black center (pupil) of the eye. Very high triglyceride levels can cause inflammation of the pancreas (pancreatitis). How is this diagnosed? Your health care provider may diagnose dyslipidemia based on a routine blood test (fasting blood test). Because most people do not have symptoms  of the condition, this blood testing (lipid profile) is done on adults age 81 and older and is repeated every 5 years. This test checks:  Total cholesterol. This measures the total amount of cholesterol in your blood, including LDL cholesterol, HDL cholesterol, and triglycerides. A healthy number is below 200.  LDL cholesterol. The target number for LDL  cholesterol is different for each person, depending on individual risk factors. Ask your health care provider what your LDL cholesterol should be.  HDL cholesterol. An HDL level of 60 or higher is best because it helps to protect against heart disease. A number below 95 for men or below 41 for women increases the risk for heart disease.  Triglycerides. A healthy triglyceride number is below 150. If your lipid profile is abnormal, your health care provider may do other blood tests. How is this treated? Treatment depends on the type of dyslipidemia that you have and your other risk factors for heart disease and stroke. Your health care provider will have a target range for your lipid levels based on this information. For many people, this condition may be treated by lifestyle changes, such as diet and exercise. Your health care provider may recommend that you:  Get regular exercise.  Make changes to your diet.  Quit smoking if you smoke. If diet changes and exercise do not help you reach your goals, your health care provider may also prescribe medicine to lower lipids. The most commonly prescribed type of medicine lowers your LDL cholesterol (statin drug). If you have a high triglyceride level, your provider may prescribe another type of drug (fibrate) or an omega-3 fish oil supplement, or both. Follow these instructions at home:  Eating and drinking  Follow instructions from your health care provider or dietitian about eating or drinking restrictions.  Eat a healthy diet as told by your health care provider. This can help you reach and maintain a healthy weight, lower your LDL cholesterol, and raise your HDL cholesterol. This may include: ? Limiting your calories, if you are overweight. ? Eating more fruits, vegetables, whole grains, fish, and lean meats. ? Limiting saturated fat, trans fat, and cholesterol.  If you drink alcohol: ? Limit how much you use. ? Be aware of how much alcohol  is in your drink. In the [place] one drink equals one 12 oz bottle of beer (355 mL), one 5 oz glass of wine (148 mL), or one 1 oz glass of hard liquor (44 mL).  Do not drink alcohol if: ? Your health care provider tells you not to drink. ? You are pregnant, may be pregnant, or are planning to become pregnant. Activity  Get regular exercise. Start an exercise and strength training program as told by your health care provider. Ask your health care provider what activities are safe for you. Your health care provider may recommend: ? 30 minutes of aerobic activity 4-6 days a week. Brisk walking is an example of aerobic activity. ? Strength training 2 days a week. General instructions  Do not use any products that contain nicotine or tobacco, such as cigarettes, e-cigarettes, and chewing tobacco. If you need help quitting, ask your health care provider.  Take over-the-counter and prescription medicines only as told by your health care provider. This includes supplements.  Keep all follow-up visits as told by your health care provider. Contact a health care provider if:  You are: ? Having trouble sticking to your exercise or diet plan. ? Struggling to quit smoking or control your  use of alcohol. Summary  Dyslipidemia often involves a high level of cholesterol or triglycerides, which are types of lipids.  Treatment depends on the type of dyslipidemia that you have and your other risk factors for heart disease and stroke.  For many people, treatment starts with lifestyle changes, such as diet and exercise.  Your health care provider may prescribe medicine to lower lipids. This information is not intended to replace advice given to you by your health care provider. Make sure you discuss any questions you have with your health care provider. Document Revised: 12/09/2017 Document Reviewed: 11/15/2017 Elsevier Patient Education  [place].

## 2019-09-22 ENCOUNTER — Other Ambulatory Visit: Payer: Self-pay | Admitting: Gastroenterology

## 2019-09-24 ENCOUNTER — Other Ambulatory Visit: Payer: Self-pay | Admitting: Family Medicine

## 2019-10-26 ENCOUNTER — Other Ambulatory Visit: Payer: Self-pay | Admitting: Family Medicine

## 2019-10-26 ENCOUNTER — Other Ambulatory Visit: Payer: Self-pay | Admitting: Internal Medicine

## 2019-10-26 ENCOUNTER — Other Ambulatory Visit: Payer: Self-pay | Admitting: Cardiology

## 2019-10-26 NOTE — Telephone Encounter (Signed)
Last filled 04/28/2019 Last OV 09/09/2019  Ok to fill?

## 2019-10-27 ENCOUNTER — Ambulatory Visit (INDEPENDENT_AMBULATORY_CARE_PROVIDER_SITE_OTHER): Payer: Medicare Other | Admitting: Family Medicine

## 2019-10-27 ENCOUNTER — Telehealth: Payer: Self-pay | Admitting: Gastroenterology

## 2019-10-27 ENCOUNTER — Other Ambulatory Visit: Payer: Self-pay | Admitting: Gastroenterology

## 2019-10-27 ENCOUNTER — Other Ambulatory Visit: Payer: Self-pay

## 2019-10-27 ENCOUNTER — Encounter: Payer: Self-pay | Admitting: Family Medicine

## 2019-10-27 VITALS — BP 130/64 | HR 79 | Temp 98.0°F | Wt 211.2 lb

## 2019-10-27 DIAGNOSIS — L739 Follicular disorder, unspecified: Secondary | ICD-10-CM | POA: Diagnosis not present

## 2019-10-27 DIAGNOSIS — F1190 Opioid use, unspecified, uncomplicated: Secondary | ICD-10-CM

## 2019-10-27 DIAGNOSIS — I2583 Coronary atherosclerosis due to lipid rich plaque: Secondary | ICD-10-CM | POA: Diagnosis not present

## 2019-10-27 DIAGNOSIS — I2510 Atherosclerotic heart disease of native coronary artery without angina pectoris: Secondary | ICD-10-CM

## 2019-10-27 MED ORDER — ALPRAZOLAM 0.5 MG PO TABS: 0.5000 mg | ORAL_TABLET | Freq: Three times a day (TID) | ORAL | 5 refills | Status: DC | PRN

## 2019-10-27 MED ORDER — CEPHALEXIN 500 MG PO CAPS: 500.0000 mg | ORAL_CAPSULE | Freq: Three times a day (TID) | ORAL | 0 refills | Status: AC

## 2019-10-27 NOTE — Progress Notes (Signed)
   Subjective:    Patient ID: [name], female    DOB: 1940/08/28, 79 y.o.   MRN: [id number]  HPI Here for a painful rash in the left armpit. She thinks this is the result of using a solid deodorant which is usually avoids. The rash started about 3 weeks ago. She has stopped using any deodorant in this area. No fever.    Review of Systems  Constitutional: Negative.   Respiratory: Negative.   Cardiovascular: Negative.   Skin: Positive for rash.       Objective:   Physical Exam Constitutional:      Appearance: Normal appearance.  Cardiovascular:     Rate and Rhythm: Normal rate and regular rhythm.     Pulses: Normal pulses.     Heart sounds: Normal heart sounds.  Pulmonary:     Effort: Pulmonary effort is normal.     Breath sounds: Normal breath sounds.  Skin:    Comments: There are a number of small pustules in the left axilla, the right axilla is clear   Neurological:     Mental Status: She is alert.           Assessment & Plan:  Folliculitis, treat with Keflex. Recheck prn.  Alysia Penna, MD

## 2019-10-28 MED ORDER — PANTOPRAZOLE SODIUM 40 MG PO TBEC: 40.0000 mg | DELAYED_RELEASE_TABLET | Freq: Two times a day (BID) | ORAL | 1 refills | Status: DC

## 2019-10-28 NOTE — Telephone Encounter (Signed)
Informed patient that I sent the prescription to the pharmacy. Patient verbalized understanding.

## 2019-10-29 ENCOUNTER — Other Ambulatory Visit: Payer: Self-pay | Admitting: Family Medicine

## 2019-10-29 DIAGNOSIS — R937 Abnormal findings on diagnostic imaging of other parts of musculoskeletal system: Secondary | ICD-10-CM

## 2019-11-04 ENCOUNTER — Telehealth: Payer: Self-pay | Admitting: Family Medicine

## 2019-11-04 DIAGNOSIS — M5136 Other intervertebral disc degeneration, lumbar region: Secondary | ICD-10-CM | POA: Diagnosis not present

## 2019-11-04 NOTE — Telephone Encounter (Signed)
Please advise

## 2019-11-04 NOTE — Telephone Encounter (Signed)
Are they asking about stopping the antibiotic or stopping the blood thinner (Plavix)?

## 2019-11-04 NOTE — Telephone Encounter (Signed)
[facility] is needing to know when it is ok for the pt to have an epidural? She stated usually it is okay after being off the antibiotic for 3 days but wanted the PCP input. This can be given over the phone or faxed.   Phone: [phone] or fax ATTN: [place] [phone]

## 2019-11-06 NOTE — Telephone Encounter (Signed)
Alissa returned called and stated they were concerned about the abx. Alissa stated that the rules have changed for stopping Plavix only if the pt is not getting Cervical epidural injections. Alissa stated that there protocol is " pt cannot receive injections if they have an active infection. Alissa stated that pt did not want to fully disclose the name of the abx as well as how long she is suppose to take it. She also stated that pt claimed she wanted to stop the medication bc she feels like it work. Pt last day of taking this is suppose to be 11/06/2019 (today). They are concerned and not sure if infection is still active.     Please advise

## 2019-11-06 NOTE — Telephone Encounter (Signed)
Called the requested call back with no answer yesterday and lm. Called again today and left another message.

## 2019-11-09 NOTE — Telephone Encounter (Signed)
Called [name] but no answer.

## 2019-11-09 NOTE — Telephone Encounter (Signed)
She should be finished with the antibiotic by now, so she is cleared for the procedure

## 2019-11-11 ENCOUNTER — Other Ambulatory Visit: Payer: Self-pay | Admitting: Cardiology

## 2019-11-12 NOTE — Telephone Encounter (Signed)
Alissa called back and I informed her of Dr. Murrell Redden message. Nothing further.

## 2019-11-16 ENCOUNTER — Other Ambulatory Visit: Payer: Self-pay | Admitting: Gastroenterology

## 2019-11-16 DIAGNOSIS — M48061 Spinal stenosis, lumbar region without neurogenic claudication: Secondary | ICD-10-CM | POA: Diagnosis not present

## 2019-11-16 DIAGNOSIS — M5136 Other intervertebral disc degeneration, lumbar region: Secondary | ICD-10-CM | POA: Diagnosis not present

## 2019-11-20 ENCOUNTER — Telehealth: Payer: Self-pay | Admitting: Neurology

## 2019-11-20 NOTE — Telephone Encounter (Signed)
Patient last seen in February 2021.  At that time, she underwent an MS workup.  Recommended LP for CSF analysis.  Received an office note from her orthopedist, dated 11/04/2019.  In the note, she stated that I did "not think this is MS".  I looked back in the chart and it appeared that she never had the LP performed.  My medical assistant contacted the patient to let her know that I did not rule out MS and had recommended getting the lumbar puncture for further evaluation.  She declined proceeding with LP and office follow up.

## 2019-12-01 ENCOUNTER — Telehealth: Payer: Self-pay

## 2019-12-01 NOTE — Progress Notes (Addendum)
Chronic Care Management Pharmacy Assistant   Name: [name]  MRN: [id number] DOB: 11-12-1940  Reason for Encounter: Disease State  Patient Questions:  1.  Have you seen any other providers since your last visit? Yes  2.  Any changes in your medicines or health? Yes  Their chronic conditions include: asthma, DM, diastolic HF, HTN, HLD, CAD, Allergies, Back pain, Anxiety, GERD, UC, history of pancreatic insufficiency, Chronic bilateral low back pain, vitamin D deficiency, anemia   PCP : [name], MD   Office Visits: 10-27-2019 (PCP). Patient presented in the office complaining of a painful rash in the left armpit ongoing for three weeks. Patient was treated with Keflex.  Consults:  09-10-2019 TE (Cardiology) . Patient contacted the office with questions about her lab results. The RN, Drue Novel, explained to the patient since she has CAD and a prior stent that she is at higher risk for future cardiac event or stroke. Made her aware that her LDL goal is to be less than 70 in order to decrease this risk. Atorvastatin was increased from 41m to 251meveryday and sent to the patient's pharmacy.  11-20-2019 TE (Neurology). Dr. JaTomi Likensotate there was a recommendation made in February 2021 for patient to get a lumbar puncture for CSF analysis at the time of the patient's MS workup. Another recommendation for the patient to get a LP was made 11-20-2019. The note states the patient declined proceeding with LP and office follow-up.  Allergies:   Allergies  Allergen Reactions   Doxycycline Other (See Comments)    Unsteady gait   Aspirin Nausea And Vomiting   Azithromycin Other (See Comments)    REACTION: pt states "ZPak doesn't work"   Caffeine Nausea And Vomiting   Ciprofloxacin Nausea And Vomiting    Tolerates levaquin   Codeine Nausea And Vomiting   Oxycodone     Pt stated, "upsets my stomach" (11/19/17)   Sulfamethizole Other (See Comments)    Unknown reaction   Tramadol  Hcl Other (See Comments)    REACTION: pt states "spaced-out"    Medications: Outpatient Encounter Medications as of 12/01/2019  Medication Sig   ACCU-CHEK FASTCLIX LANCETS MISC Test once per day and diagnosis code is E 11.9   acetaminophen (TYLENOL) 500 MG tablet Take 500 mg by mouth every 6 (six) hours as needed for headache (pain).    albuterol (PROVENTIL) (2.5 MG/3ML) 0.083% nebulizer solution Take 3 mLs (2.5 mg total) by nebulization every 4 (four) hours as needed for wheezing or shortness of breath.   albuterol (VENTOLIN HFA) 108 (90 BASE) MCG/ACT inhaler Inhale 2 puffs into the lungs every 4 (four) hours as needed for wheezing or shortness of breath.   ALPRAZolam (XANAX) 0.5 MG tablet Take 1 tablet (0.5 mg total) by mouth 3 (three) times daily as needed.   amLODipine (NORVASC) 10 MG tablet TAKE 1 TABLET BY MOUTH EVERY DAY   amoxicillin (AMOXIL) 500 MG capsule Take 2 capsules (1,000 mg total) by mouth 2 (two) times daily.   atenolol (TENORMIN) 50 MG tablet TAKE 1 TABLET BY MOUTH EVERY DAY   atorvastatin (LIPITOR) 20 MG tablet Take 1 tablet (20 mg total) by mouth daily.   azelastine (ASTELIN) 0.1 % nasal spray Place 1 spray into both nostrils at bedtime.   budesonide-formoterol (SYMBICORT) 160-4.5 MCG/ACT inhaler Inhale 2 puffs into the lungs 2 (two) times daily.   Cholecalciferol (VITAMIN D-3) 5000 UNITS TABS Take 5,000 Units by mouth daily.     clopidogrel (  PLAVIX) 75 MG tablet TAKE 1 TABLET BY MOUTH EVERY DAY   clotrimazole (LOTRIMIN) 1 % external solution Apply 1 application topically 2 (two) times daily. In between toes   diclofenac sodium (VOLTAREN) 1 % GEL As needed for pain   diclofenac Sodium (VOLTAREN) 1 % GEL    diphenoxylate-atropine (LOMOTIL) 2.5-0.025 MG tablet TAKE 1 TABLET BY MOUTH THREE TIMES A DAY AS NEEDED FOR DIARRHEA OR LOOSE STOOLS   Ferrous Sulfate (IRON) 325 (65 Fe) MG TABS Take 1 tablet (325 mg total) by mouth 2 (two) times daily. (Patient taking differently: Take  325 mg by mouth daily. )   fluticasone (FLONASE) 50 MCG/ACT nasal spray PLACE 2 SPRAYS IN EACH NOSTRIL AT BEDTIME   furosemide (LASIX) 40 MG tablet TAKE 1 TABLET (40 MG TOTAL) BY MOUTH 2 (TWO) TIMES DAILY.   gabapentin (NEURONTIN) 300 MG capsule Take 1 capsule (300 mg total) by mouth 3 (three) times daily.   gabapentin (NEURONTIN) 600 MG tablet TAKE 1 TABLET BY MOUTH THREE TIMES A DAY   glimepiride (AMARYL) 1 MG tablet TAKE 1 TABLET BY MOUTH EVERY DAY BEFORE BREAKFAST   glucose blood (ACCU-CHEK AVIVA PLUS) test strip TEST ONCE PER DAY AND DIAGNOSIS CODE IS E 11.9   glycopyrrolate (ROBINUL) 1 MG tablet Take 1 tablet (1 mg total) by mouth 2 (two) times daily.   HYDROcodone-acetaminophen (NORCO) 10-325 MG tablet Take 1 tablet by mouth every 6 (six) hours as needed.   HYDROcodone-homatropine (HYDROMET) 5-1.5 MG/5ML syrup Take 5 mLs by mouth every 4 (four) hours as needed.   hydrOXYzine (ATARAX/VISTARIL) 25 MG tablet TAKE 1 TABLET EVERY 4 HOURS AS NEEDED FOR ITCHING   hydrOXYzine (VISTARIL) 25 MG capsule TAKE 1 CAPSULE (25 MG TOTAL) BY MOUTH EVERY 6 (SIX) HOURS AS NEEDED.   isosorbide mononitrate (IMDUR) 60 MG 24 hr tablet Take 1 tablet (60 mg total) by mouth daily.   ketoconazole (NIZORAL) 2 % cream Apply 1 application topically daily.   levocetirizine (XYZAL) 5 MG tablet TAKE 1 TABLET BY MOUTH EVERY DAY IN THE EVENING   LIDODERM 5 % APPLY 1 PATCH TO SKIN FOR 12 HOURS THEN REMOVE AND DISCARD PATCH WITHIN 72 HOURS OR AS DIRECTED   loperamide (IMODIUM) 2 MG capsule Take 4-6 mg by mouth daily.   losartan (COZAAR) 50 MG tablet Take 1 tablet (50 mg total) by mouth daily.   magnesium oxide (MAG-OX) 400 MG tablet TAKE 1 TABLET BY MOUTH EVERY DAY   mesalamine (LIALDA) 1.2 g EC tablet Take 2 tablets (2.4 g total) by mouth 2 (two) times daily.   metFORMIN (GLUCOPHAGE) 1000 MG tablet TAKE 1 TABLET (1,000 MG TOTAL) BY MOUTH 2 (TWO) TIMES DAILY WITH A MEAL.   methylPREDNISolone acetate (DEPO-MEDROL) 40 MG/ML  injection SMARTSIG:2 Milliliter(s) IM Once PRN   montelukast (SINGULAIR) 10 MG tablet Take one every morning   nabumetone (RELAFEN) 750 MG tablet TAKE 1 TABLET BY MOUTH TWICE A DAY WITH MEAL   neomycin-polymyxin-hydrocortisone (CORTISPORIN) otic solution Use 1 drop in affected ear three times daily as needed   nitroGLYCERIN (NITROSTAT) 0.4 MG SL tablet PLACE 1 TABLET (0.4 MG TOTAL) UNDER THE TONGUE EVERY 5 (FIVE) MINUTES AS NEEDED FOR CHEST PAIN.   Olopatadine HCl (PAZEO) 0.7 % SOLN Place 1 drop into both eyes daily as needed (itching/allergies).   ondansetron (ZOFRAN) 4 MG tablet Take 1 tablet (4 mg total) by mouth every 6 (six) hours as needed for nausea or vomiting.   ondansetron (ZOFRAN-ODT) 4 MG disintegrating tablet TAKE  1 TABLET BY MOUTH EVERY 8 HOURS AS NEEDED FOR NAUSEA AND VOMITING   pantoprazole (PROTONIX) 40 MG tablet Take 1 tablet (40 mg total) by mouth 2 (two) times daily.   Polyethyl Glycol-Propyl Glycol (SYSTANE OP) Place 1 drop into both eyes daily. For dry eyes   potassium chloride SA (KLOR-CON M20) 20 MEQ tablet Take 1 tablet (20 mEq total) by mouth 3 (three) times daily.   tiZANidine (ZANAFLEX) 4 MG tablet TAKE 1 TABLET BY MOUTH TWICE A DAY   ZENPEP 20000-63000 units CPEP TAKE 2 CAPSULES (40,000 UNITS) WITH MEALS AND 1 CAPSULE (20,000 UNITS) WITH A SNACK   Facility-Administered Encounter Medications as of 12/01/2019  Medication   ipratropium-albuterol (DUONEB) 0.5-2.5 (3) MG/3ML nebulizer solution 3 mL    Current Diagnosis: Patient Active Problem List   Diagnosis Date Noted   H. pylori infection 05/12/2019   Low back pain 10/21/2018   Fibromyalgia 01/03/2017   Left sided colitis ([place]) 05/13/2015   Diarrhea 08/04/2013   Back pain, chronic 02/06/2012   Hyperlipidemia 12/07/2011   Hematuria 11/27/2011   Ulcerative colitis ([place]) 11/25/2011   Chronic diastolic CHF (congestive heart failure) ([place]) 11/25/2011   Lower extremity edema 11/25/2011   Nonischemic cardiomyopathy  ([place]) 04/17/2011   CAD (coronary artery disease)    Bronchiectasis ([place])    Essential hypertension    VITAMIN D DEFICIENCY 12/03/2009   Depression 08/10/2008   PULMONARY NODULE 07/15/2008   NEOPLASM, KIDNEY 03/16/2008   STRESS INCONTINENCE 03/16/2008   COLONIC POLYPS 07/19/2007   Diabetes mellitus without complication ([place]) 32/03/2481   OBESITY 07/19/2007   ANEMIA 07/19/2007   Asthma 07/19/2007   DIVERTICULOSIS OF COLON 07/19/2007   ANXIETY 03/19/2007   GERD 03/19/2007   Osteoarthritis 03/19/2007   FIBROMYALGIA 03/19/2007   Allergic state 03/19/2007    Goals Addressed   None      12/01/2019 Name: Tabitha Riggins MRN: [id number] DOB: 05-30-1940 Alysia Scism is a 79 y.o. year old female who is a primary care patient of [name], MD.  Comprehensive medication review performed; Spoke to patient regarding cholesterol  Lipid Panel    Component Value Date/Time   CHOL 161 09/08/2019 1134   TRIG 91 09/08/2019 1134   HDL 62 09/08/2019 1134   [place] 82 09/08/2019 1134    10-year ASCVD risk score: The 10-year ASCVD risk score Mikey Bussing DC Jr., et al., 2013) is: 32.3%   Values used to calculate the score:     Age: 44 years     Sex: Female     Is Non-Hispanic African American: Yes     Diabetic: Yes     Tobacco smoker: No     Systolic Blood Pressure: 891 mmHg     Is BP treated: Yes     HDL Cholesterol: 62 mg/dL     Total Cholesterol: 161 mg/dL  Current antihyperlipidemic regimen:  Atorvastatin 22m, 1 tablet every evening  Previous antihyperlipidemic medications tried: none ASCVD risk enhancing conditions: age >>61 DM, HTN, CKD, CHF, current smoker What recent interventions/DTPs have been made by any provider to improve Cholesterol control since last CPP Visit: Atorvastatin was increased to 24mtablet every day. Any recent hospitalizations or ED visits since last visit with CPP? No What diet changes have been made to improve Cholesterol?  Patient states she doesn't eat  much due to ulcerative colitis. When she does eat its very little. What exercise is being done to improve Cholesterol?  Patient states she does not do much exercise because of her  spine. She has trouble walking and wears a back brace. I did inquire with the patient if she was able to follow up on the recommended  LP procedure by Dr. Tomi Likens. Patient stated she was not interested in obtaining a LP because she does not have MS and does not feel it necessary.   Lafonda Mosses, [place] Pharmacist Assistant [phone]

## 2019-12-02 NOTE — Progress Notes (Signed)
Discussed with CMA, patient inquired on smaller pill of potassium chloride.   Conducted cost review for 90 day supply as other formulations of potassium chloride may not be cost effective per insurance formulation.    Potassium chloride crys ER 38mq tablet (current formulation: $9.00   Potassium chloride ER 16m capsules (2 caps daily): $90  Potassium 2024mpacket: $253.    Other options to ease administration, patient may break potassium chloride crys ER (Klor-con M) in half and swallow each half separately or the whole tablet may be dissolved in ~4 ounces of water (allow ~2 minutes to dissolve, stir well and drink immediately).  Called patient to relay above information. Left HIPAA complaint message on voicemail for return call to [phone]

## 2019-12-04 ENCOUNTER — Other Ambulatory Visit: Payer: Self-pay | Admitting: Gastroenterology

## 2019-12-09 NOTE — Progress Notes (Signed)
Called patient and discussed options to ease administration of potassium chloride tablet as mentioned below. Patient understood directions and was instructed to CCM team if there are other questions or concerns.   Anson Crofts, PharmD Clinical Pharmacist [facility] [phone]

## 2019-12-10 ENCOUNTER — Other Ambulatory Visit: Payer: Self-pay

## 2019-12-10 ENCOUNTER — Other Ambulatory Visit: Payer: Medicare Other | Admitting: *Deleted

## 2019-12-10 DIAGNOSIS — E785 Hyperlipidemia, unspecified: Secondary | ICD-10-CM

## 2019-12-10 LAB — LIPID PANEL
Chol/HDL Ratio: 2.2 ratio (ref 0.0–4.4)
Cholesterol, Total: 127 mg/dL (ref 100–199)
HDL: 59 mg/dL (ref >39)
LDL Chol Calc (NIH): 51 mg/dL (ref 0–99)
Triglycerides: 91 mg/dL (ref 0–149)
VLDL Cholesterol Cal: 17 mg/dL (ref 5–40)

## 2019-12-10 LAB — HEPATIC FUNCTION PANEL
ALT: 6 IU/L (ref 0–32)
AST: 11 IU/L (ref 0–40)
Albumin: 4.5 g/dL (ref 3.7–4.7)
Alkaline Phosphatase: 63 IU/L (ref 48–121)
Bilirubin Total: 0.5 mg/dL (ref 0.0–1.2)
Bilirubin, Direct: 0.19 mg/dL (ref 0.00–0.40)
Total Protein: 6.6 g/dL (ref 6.0–8.5)

## 2019-12-14 ENCOUNTER — Other Ambulatory Visit: Payer: Self-pay | Admitting: Family Medicine

## 2019-12-14 NOTE — Progress Notes (Signed)
Pt has been made aware of normal result and verbalized understanding.  jw

## 2019-12-22 DIAGNOSIS — L304 Erythema intertrigo: Secondary | ICD-10-CM | POA: Diagnosis not present

## 2019-12-30 ENCOUNTER — Encounter: Payer: Self-pay | Admitting: Family Medicine

## 2019-12-30 ENCOUNTER — Other Ambulatory Visit: Payer: Self-pay

## 2019-12-30 ENCOUNTER — Ambulatory Visit (INDEPENDENT_AMBULATORY_CARE_PROVIDER_SITE_OTHER): Payer: Medicare Other | Admitting: Family Medicine

## 2019-12-30 VITALS — BP 130/82 | HR 59 | Temp 98.1°F

## 2019-12-30 DIAGNOSIS — R350 Frequency of micturition: Secondary | ICD-10-CM | POA: Diagnosis not present

## 2019-12-30 DIAGNOSIS — I2510 Atherosclerotic heart disease of native coronary artery without angina pectoris: Secondary | ICD-10-CM

## 2019-12-30 DIAGNOSIS — I2583 Coronary atherosclerosis due to lipid rich plaque: Secondary | ICD-10-CM | POA: Diagnosis not present

## 2019-12-30 DIAGNOSIS — R300 Dysuria: Secondary | ICD-10-CM

## 2019-12-30 LAB — POCT URINALYSIS DIPSTICK
Bilirubin, UA: NEGATIVE
Blood, UA: NEGATIVE
Glucose, UA: NEGATIVE
Ketones, UA: NEGATIVE
Leukocytes, UA: NEGATIVE
Nitrite, UA: NEGATIVE
Protein, UA: POSITIVE — AB
Spec Grav, UA: 1.02 (ref 1.010–1.025)
Urobilinogen, UA: 0.2 E.U./dL
pH, UA: 6 (ref 5.0–8.0)

## 2019-12-30 MED ORDER — MIRABEGRON ER 25 MG PO TB24: 25.0000 mg | ORAL_TABLET | Freq: Every day | ORAL | 1 refills | Status: DC

## 2019-12-30 NOTE — Progress Notes (Signed)
Established Patient Office Visit  Subjective:  Patient ID: [name], female    DOB: 02-24-1941  Age: 79 y.o. MRN: [id number]  CC: No chief complaint on file.   HPI Rejoice Heatwole presents for urine frequency.  This occurred last night.  She states she basically did not sleep much because she went at least 10 times.  She had some mild irritation but no real burning with urination.  No hematuria.  She took some Azo which seemed to help.  No fever.  No chills.  No nausea or vomiting.  Does not drink any caffeine.  No alcohol.  She does take furosemide 40 mg 2 tablets daily and usually takes these around 1 PM.  She does have type 2 diabetes.  She had recent A1c 6.6% back in May and blood sugars recently have been well controlled.  No polydipsia.  Past Medical History:  Diagnosis Date  . Adenomatous colon polyp 02/2006  . Allergy   . Allergy, unspecified not elsewhere classified   . Anemia   . Anxiety   . Arthritis   . Asthma   . Blood transfusion without reported diagnosis   . Bronchiectasis   . CAD (coronary artery disease)    BMS to the LAD, 2002; cath 12/07/11 patent LAD stent and mild nonobstructive disease, EF 65%; Medical management  . Cataract    removed both eyes  . CHF (congestive heart failure) (HCC)    Diastolic  . Clotting disorder (HCC)    in legs per pt   . Diabetes mellitus   . Diverticulosis of colon   . DJD (degenerative joint disease)   . Fibromyalgia   . GERD (gastroesophageal reflux disease)   . Hypercholesterolemia   . Hypertension   . Microscopic hematuria   . Neoplasm of kidney   . Obesity   . Other diseases of lung, not elsewhere classified   . Pinched vertebral nerve   . Pulmonary nodule    Negative PET in 2010  . Stress incontinence, female   . Syncope   . Ulcerative colitis, left sided ([place]) 2008   Hx of  . UTI (lower urinary tract infection)   . Vitamin D deficiency disease     Past Surgical History:  Procedure Laterality Date  .  ABDOMINAL HYSTERECTOMY    . CARDIAC CATHETERIZATION  11/2011  . CATARACT EXTRACTION, BILATERAL  2020  . CHOLECYSTECTOMY    . COLONOSCOPY    . COLONOSCOPY W/ BIOPSIES  11-12-12   per Dr. Fuller Plan, diverticulosis only, repeat in 5 yrs  . CORONARY STENT PLACEMENT  2002   BMS to the LAD  . ESOPHAGOGASTRODUODENOSCOPY  12/2008  . HAND SURGERY  01/2006   Right Hand Surgery by Dr. Amedeo Plenty  . LEFT HEART CATHETERIZATION WITH CORONARY ANGIOGRAM N/A 12/07/2011   Procedure: LEFT HEART CATHETERIZATION WITH CORONARY ANGIOGRAM;  Surgeon: Peter M Martinique, MD;  Location: [facility] CATH LAB;  Service: Cardiovascular;  Laterality: N/A;  . POLYPECTOMY    . UPPER GASTROINTESTINAL ENDOSCOPY      Family History  Problem Relation Age of Onset  . Pneumonia Father   . Heart attack Father   . Colon polyps Father   . Parkinsonism Mother   . Diabetes Brother   . Sarcoidosis Brother   . Hypertension Sister   . Diabetes Sister   . Breast cancer Daughter   . Colon cancer Neg Hx   . Esophageal cancer Neg Hx   . Rectal cancer Neg Hx   . Stomach cancer  Neg Hx     Social History   Socioeconomic History  . Marital status: Divorced    Spouse name: Not on file  . Number of children: 3  . Years of education: Not on file  . Highest education level: Not on file  Occupational History  . Occupation: Retired    Fish farm manager: RETIRED  Tobacco Use  . Smoking status: Former Smoker    Packs/day: 2.00    Years: 15.00    Pack years: 30.00    Types: Cigarettes    Quit date: 04/30/1973    Years since quitting: 46.6  . Smokeless tobacco: Never Used  . Tobacco comment: ex-smoker: smoked for 10-15 years up to 2ppd, quit 1975.  Vaping Use  . Vaping Use: Never used  Substance and Sexual Activity  . Alcohol use: Not Currently    Alcohol/week: 0.0 standard drinks    Comment: none now   . Drug use: No  . Sexual activity: Never  Other Topics Concern  . Not on file  Social History Narrative   Right handed   Social Determinants of  Health   Financial Resource Strain: Low Risk   . Difficulty of Paying Living Expenses: Not hard at all  Food Insecurity:   . Worried About Charity fundraiser in the Last Year: Not on file  . Ran Out of Food in the Last Year: Not on file  Transportation Needs: No Transportation Needs  . Lack of Transportation (Medical): No  . Lack of Transportation (Non-Medical): No  Physical Activity:   . Days of Exercise per Week: Not on file  . Minutes of Exercise per Session: Not on file  Stress:   . Feeling of Stress : Not on file  Social Connections:   . Frequency of Communication with Friends and Family: Not on file  . Frequency of Social Gatherings with Friends and Family: Not on file  . Attends Religious Services: Not on file  . Active Member of Clubs or Organizations: Not on file  . Attends Archivist Meetings: Not on file  . Marital Status: Not on file  Intimate Partner Violence:   . Fear of Current or Ex-Partner: Not on file  . Emotionally Abused: Not on file  . Physically Abused: Not on file  . Sexually Abused: Not on file    Outpatient Medications Prior to Visit  Medication Sig Dispense Refill  . ACCU-CHEK FASTCLIX LANCETS MISC Test once per day and diagnosis code is E 11.9 102 each 1  . acetaminophen (TYLENOL) 500 MG tablet Take 500 mg by mouth every 6 (six) hours as needed for headache (pain).     Marland Kitchen albuterol (PROVENTIL) (2.5 MG/3ML) 0.083% nebulizer solution Take 3 mLs (2.5 mg total) by nebulization every 4 (four) hours as needed for wheezing or shortness of breath. 75 mL 3  . albuterol (VENTOLIN HFA) 108 (90 BASE) MCG/ACT inhaler Inhale 2 puffs into the lungs every 4 (four) hours as needed for wheezing or shortness of breath. 18 each 11  . ALPRAZolam (XANAX) 0.5 MG tablet Take 1 tablet (0.5 mg total) by mouth 3 (three) times daily as needed. 90 tablet 5  . amLODipine (NORVASC) 10 MG tablet TAKE 1 TABLET BY MOUTH EVERY DAY 90 tablet 2  . amoxicillin (AMOXIL) 500 MG  capsule Take 2 capsules (1,000 mg total) by mouth 2 (two) times daily. 56 capsule 0  . atenolol (TENORMIN) 50 MG tablet TAKE 1 TABLET BY MOUTH EVERY DAY 90 tablet 2  .  atorvastatin (LIPITOR) 20 MG tablet Take 1 tablet (20 mg total) by mouth daily. 90 tablet 3  . azelastine (ASTELIN) 0.1 % nasal spray Place 1 spray into both nostrils at bedtime.    . budesonide-formoterol (SYMBICORT) 160-4.5 MCG/ACT inhaler Inhale 2 puffs into the lungs 2 (two) times daily.    . Cholecalciferol (VITAMIN D-3) 5000 UNITS TABS Take 5,000 Units by mouth daily.      . clopidogrel (PLAVIX) 75 MG tablet TAKE 1 TABLET BY MOUTH EVERY DAY 90 tablet 1  . clotrimazole (LOTRIMIN) 1 % external solution Apply 1 application topically 2 (two) times daily. In between toes 60 mL 5  . diclofenac sodium (VOLTAREN) 1 % GEL As needed for pain    . diclofenac Sodium (VOLTAREN) 1 % GEL     . diphenoxylate-atropine (LOMOTIL) 2.5-0.025 MG tablet TAKE 1 TABLET BY MOUTH THREE TIMES A DAY AS NEEDED FOR DIARRHEA OR LOOSE STOOLS 60 tablet 2  . Ferrous Sulfate (IRON) 325 (65 Fe) MG TABS Take 1 tablet (325 mg total) by mouth 2 (two) times daily. (Patient taking differently: Take 325 mg by mouth daily. ) 30 tablet 0  . fluticasone (FLONASE) 50 MCG/ACT nasal spray PLACE 2 SPRAYS IN EACH NOSTRIL AT BEDTIME 16 g 6  . furosemide (LASIX) 40 MG tablet TAKE 1 TABLET (40 MG TOTAL) BY MOUTH 2 (TWO) TIMES DAILY. 180 tablet 3  . gabapentin (NEURONTIN) 300 MG capsule Take 1 capsule (300 mg total) by mouth 3 (three) times daily. 90 capsule 5  . gabapentin (NEURONTIN) 600 MG tablet TAKE 1 TABLET BY MOUTH THREE TIMES A DAY 180 tablet 0  . glimepiride (AMARYL) 1 MG tablet TAKE 1 TABLET BY MOUTH EVERY DAY BEFORE BREAKFAST 90 tablet 3  . glucose blood (ACCU-CHEK AVIVA PLUS) test strip TEST ONCE PER DAY AND DIAGNOSIS CODE IS E 11.9 100 strip 5  . glycopyrrolate (ROBINUL) 1 MG tablet Take 1 tablet (1 mg total) by mouth 2 (two) times daily. 60 tablet 5  .  HYDROcodone-homatropine (HYDROMET) 5-1.5 MG/5ML syrup Take 5 mLs by mouth every 4 (four) hours as needed. 240 mL 0  . hydrOXYzine (ATARAX/VISTARIL) 25 MG tablet TAKE 1 TABLET EVERY 4 HOURS AS NEEDED FOR ITCHING 50 tablet 0  . hydrOXYzine (VISTARIL) 25 MG capsule TAKE 1 CAPSULE (25 MG TOTAL) BY MOUTH EVERY 6 (SIX) HOURS AS NEEDED. 360 capsule 3  . isosorbide mononitrate (IMDUR) 60 MG 24 hr tablet Take 1 tablet (60 mg total) by mouth daily. 90 tablet 3  . ketoconazole (NIZORAL) 2 % cream Apply 1 application topically daily. 15 g 1  . levocetirizine (XYZAL) 5 MG tablet TAKE 1 TABLET BY MOUTH EVERY DAY IN THE EVENING 90 tablet 3  . LIDODERM 5 % APPLY 1 PATCH TO SKIN FOR 12 HOURS THEN REMOVE AND DISCARD PATCH WITHIN 72 HOURS OR AS DIRECTED 30 patch 2  . loperamide (IMODIUM) 2 MG capsule Take 4-6 mg by mouth daily.    Marland Kitchen losartan (COZAAR) 50 MG tablet Take 1 tablet (50 mg total) by mouth daily. 90 tablet 3  . magnesium oxide (MAG-OX) 400 MG tablet TAKE 1 TABLET BY MOUTH EVERY DAY 120 tablet 2  . mesalamine (LIALDA) 1.2 g EC tablet TAKE 2 TABLETS (2.4 G TOTAL) BY MOUTH 2 (TWO) TIMES DAILY. 120 tablet 1  . metFORMIN (GLUCOPHAGE) 1000 MG tablet TAKE 1 TABLET (1,000 MG TOTAL) BY MOUTH 2 (TWO) TIMES DAILY WITH A MEAL. 180 tablet 2  . methylPREDNISolone acetate (DEPO-MEDROL) 40 MG/ML injection  SMARTSIG:2 Milliliter(s) IM Once PRN    . montelukast (SINGULAIR) 10 MG tablet Take one every morning 90 tablet 3  . nabumetone (RELAFEN) 750 MG tablet TAKE 1 TABLET BY MOUTH TWICE A DAY WITH MEAL 180 tablet 0  . neomycin-polymyxin-hydrocortisone (CORTISPORIN) otic solution Use 1 drop in affected ear three times daily as needed 10 mL 0  . nitroGLYCERIN (NITROSTAT) 0.4 MG SL tablet PLACE 1 TABLET (0.4 MG TOTAL) UNDER THE TONGUE EVERY 5 (FIVE) MINUTES AS NEEDED FOR CHEST PAIN. 75 tablet 1  . Olopatadine HCl (PAZEO) 0.7 % SOLN Place 1 drop into both eyes daily as needed (itching/allergies).    . ondansetron (ZOFRAN) 4 MG  tablet Take 1 tablet (4 mg total) by mouth every 6 (six) hours as needed for nausea or vomiting. 40 tablet 0  . ondansetron (ZOFRAN-ODT) 4 MG disintegrating tablet TAKE 1 TABLET BY MOUTH EVERY 8 HOURS AS NEEDED FOR NAUSEA AND VOMITING 20 tablet 0  . pantoprazole (PROTONIX) 40 MG tablet Take 1 tablet (40 mg total) by mouth 2 (two) times daily. 180 tablet 1  . Polyethyl Glycol-Propyl Glycol (SYSTANE OP) Place 1 drop into both eyes daily. For dry eyes    . potassium chloride SA (KLOR-CON M20) 20 MEQ tablet Take 1 tablet (20 mEq total) by mouth 3 (three) times daily. 270 tablet 3  . tiZANidine (ZANAFLEX) 4 MG tablet TAKE 1 TABLET BY MOUTH TWICE A DAY 180 tablet 0  . ZENPEP 20000-63000 units CPEP TAKE 2 CAPSULES (40,000 UNITS) WITH MEALS AND 1 CAPSULE (20,000 UNITS) WITH A SNACK 240 capsule 2   Facility-Administered Medications Prior to Visit  Medication Dose Route Frequency Provider Last Rate Last Admin  . ipratropium-albuterol (DUONEB) 0.5-2.5 (3) MG/3ML nebulizer solution 3 mL  3 mL Nebulization Once Dorothyann Peng, NP        Allergies  Allergen Reactions  . Doxycycline Other (See Comments)    Unsteady gait  . Aspirin Nausea And Vomiting  . Azithromycin Other (See Comments)    REACTION: pt states "ZPak doesn't work"  . Caffeine Nausea And Vomiting  . Ciprofloxacin Nausea And Vomiting    Tolerates levaquin  . Codeine Nausea And Vomiting  . Oxycodone     Pt stated, "upsets my stomach" (11/19/17)  . Sulfamethizole Other (See Comments)    Unknown reaction  . Tramadol Hcl Other (See Comments)    REACTION: pt states "spaced-out"    ROS Review of Systems  Constitutional: Negative for chills and fever.  Genitourinary: Positive for frequency and urgency. Negative for difficulty urinating.      Objective:    Physical Exam Vitals reviewed.  Cardiovascular:     Rate and Rhythm: Normal rate and regular rhythm.  Pulmonary:     Effort: Pulmonary effort is normal.     Breath sounds:  Normal breath sounds.  Neurological:     Mental Status: She is alert.     BP 130/82   Pulse (!) 59   Temp 98.1 F (36.7 C) (Other (Comment))   LMP  (LMP Unknown)   SpO2 95%  Wt Readings from Last 3 Encounters:  10/27/19 211 lb 3.2 oz (95.8 kg)  09/08/19 207 lb 12.8 oz (94.3 kg)  06/08/19 204 lb (92.5 kg)     Health Maintenance Due  Topic Date Due  . Hepatitis C Screening  Never done  . COVID-19 Vaccine (1) Never done  . TETANUS/TDAP  Never done  . DEXA SCAN  Never done  . PNA vac Low Risk  Adult (2 of 2 - PPSV23) 03/14/2016  . FOOT EXAM  07/28/2019  . INFLUENZA VACCINE  Never done    There are no preventive care reminders to display for this patient.  Lab Results  Component Value Date   TSH 0.96 12/15/2018   Lab Results  Component Value Date   WBC 8.9 09/08/2019   HGB 12.5 09/08/2019   HCT 38.0 09/08/2019   MCV 92 09/08/2019   PLT 462 (H) 09/08/2019   Lab Results  Component Value Date   NA 142 09/08/2019   K 4.7 09/08/2019   CO2 24 09/08/2019   GLUCOSE 129 (H) 09/08/2019   BUN 16 09/08/2019   CREATININE 0.59 09/08/2019   BILITOT 0.5 12/10/2019   ALKPHOS 63 12/10/2019   AST 11 12/10/2019   ALT 6 12/10/2019   PROT 6.6 12/10/2019   ALBUMIN 4.5 12/10/2019   CALCIUM 9.7 09/08/2019   ANIONGAP 11 09/10/2017   GFR 138.51 02/25/2019   Lab Results  Component Value Date   CHOL 127 12/10/2019   Lab Results  Component Value Date   HDL 59 12/10/2019   Lab Results  Component Value Date   LDLCALC 51 12/10/2019   Lab Results  Component Value Date   TRIG 91 12/10/2019   Lab Results  Component Value Date   CHOLHDL 2.2 12/10/2019   Lab Results  Component Value Date   HGBA1C 6.6 (H) 09/08/2019      Assessment & Plan:   Urine frequency.  Patient is describing severe frequency last night with just some mild irritation.  She does take Lasix but has been on this for some time.  She has diabetes but this has been fairly well controlled.  No caffeine  use.  -Check urinalysis to rule out UTI.  Urine dipstick reveals some protein but no leukocytes, blood, or nitrites.  No evidence for infection. -We discussed limiting fluid intake especially after 6 PM -Continued avoidance of caffeine -Encouraged her to take her Lasix if possible before 12 noon -Consider trial of Myrbetriq 25 mg nightly if she has any further nights of severe symptoms -Doubt diabetes insipidus as her symptoms are much better today and seem to be confined to last night - and not chronic or persistent.  No orders of the defined types were placed in this encounter.   Follow-up: No follow-ups on file.    Carolann Littler, MD

## 2019-12-30 NOTE — Patient Instructions (Signed)
Urinary Frequency, Adult Urinary frequency means urinating more often than usual. You may urinate every 1-2 hours even though you drink a normal amount of fluid and do not have a bladder infection or condition. Although you urinate more often than normal, the total amount of urine produced in a day is normal. With urinary frequency, you may have an urgent need to urinate often. The stress and anxiety of needing to find a bathroom quickly can make this urge worse. This condition may go away on its own or you may need treatment at home. Home treatment may include bladder training, exercises, taking medicines, or making changes to your diet. Follow these instructions at home: Bladder health   Keep a bladder diary if told by your health care provider. Keep track of: ? What you eat and drink. ? How often you urinate. ? How much you urinate.  Follow a bladder training program if told by your health care provider. This may include: ? Learning to delay going to the bathroom. ? Double urinating (voiding). This helps if you are not completely emptying your bladder. ? Scheduled voiding.  Do Kegel exercises as told by your health care provider. Kegel exercises strengthen the muscles that help control urination, which may help the condition. Eating and drinking  If told by your health care provider, make diet changes, such as: ? Avoiding caffeine. ? Drinking fewer fluids, especially alcohol. ? Not drinking in the evening. ? Avoiding foods or drinks that may irritate the bladder. These include coffee, tea, soda, artificial sweeteners, citrus, tomato-based foods, and chocolate. ? Eating foods that help prevent or ease constipation. Constipation can make this condition worse. Your health care provider may recommend that you:  Drink enough fluid to keep your urine pale yellow.  Take over-the-counter or prescription medicines.  Eat foods that are high in fiber, such as beans, whole grains, and fresh  fruits and vegetables.  Limit foods that are high in fat and processed sugars, such as fried or sweet foods. General instructions  Take over-the-counter and prescription medicines only as told by your health care provider.  Keep all follow-up visits as told by your health care provider. This is important. Contact a health care provider if:  You start urinating more often.  You feel pain or irritation when you urinate.  You notice blood in your urine.  Your urine looks cloudy.  You develop a fever.  You begin vomiting. Get help right away if:  You are unable to urinate. Summary  Urinary frequency means urinating more often than usual. With urinary frequency, you may urinate every 1-2 hours even though you drink a normal amount of fluid and do not have a bladder infection or other bladder condition.  Your health care provider may recommend that you keep a bladder diary, follow a bladder training program, or make dietary changes.  If told by your health care provider, do Kegel exercises to strengthen the muscles that help control urination.  Take over-the-counter and prescription medicines only as told by your health care provider.  Contact a health care provider if your symptoms do not improve or get worse. This information is not intended to replace advice given to you by your health care provider. Make sure you discuss any questions you have with your health care provider. Document Revised: 10/24/2017 Document Reviewed: 10/24/2017 Elsevier Patient Education  [place].

## 2020-01-01 ENCOUNTER — Other Ambulatory Visit: Payer: Self-pay | Admitting: Gastroenterology

## 2020-01-13 ENCOUNTER — Other Ambulatory Visit: Payer: Self-pay

## 2020-01-13 ENCOUNTER — Other Ambulatory Visit: Payer: Medicare Other

## 2020-01-13 DIAGNOSIS — F1190 Opioid use, unspecified, uncomplicated: Secondary | ICD-10-CM

## 2020-01-13 NOTE — Addendum Note (Signed)
Addended by: Marrion Coy on: 01/13/2020 11:16 AM   Modules accepted: Orders

## 2020-01-14 ENCOUNTER — Encounter: Payer: Self-pay | Admitting: Family Medicine

## 2020-01-14 ENCOUNTER — Telehealth (INDEPENDENT_AMBULATORY_CARE_PROVIDER_SITE_OTHER): Payer: Medicare Other | Admitting: Family Medicine

## 2020-01-14 DIAGNOSIS — M5441 Lumbago with sciatica, right side: Secondary | ICD-10-CM | POA: Diagnosis not present

## 2020-01-14 DIAGNOSIS — I2583 Coronary atherosclerosis due to lipid rich plaque: Secondary | ICD-10-CM | POA: Diagnosis not present

## 2020-01-14 DIAGNOSIS — R937 Abnormal findings on diagnostic imaging of other parts of musculoskeletal system: Secondary | ICD-10-CM

## 2020-01-14 DIAGNOSIS — F1190 Opioid use, unspecified, uncomplicated: Secondary | ICD-10-CM

## 2020-01-14 DIAGNOSIS — M5442 Lumbago with sciatica, left side: Secondary | ICD-10-CM | POA: Diagnosis not present

## 2020-01-14 DIAGNOSIS — G8929 Other chronic pain: Secondary | ICD-10-CM

## 2020-01-14 DIAGNOSIS — I2510 Atherosclerotic heart disease of native coronary artery without angina pectoris: Secondary | ICD-10-CM | POA: Diagnosis not present

## 2020-01-14 MED ORDER — HYDROCODONE-ACETAMINOPHEN 10-325 MG PO TABS: 1.0000 | ORAL_TABLET | Freq: Four times a day (QID) | ORAL | 0 refills | Status: AC | PRN

## 2020-01-14 MED ORDER — HYDROCODONE-ACETAMINOPHEN 10-325 MG PO TABS: 1.0000 | ORAL_TABLET | Freq: Four times a day (QID) | ORAL | 0 refills | Status: DC | PRN

## 2020-01-14 MED ORDER — GABAPENTIN 600 MG PO TABS: 600.0000 mg | ORAL_TABLET | Freq: Three times a day (TID) | ORAL | 5 refills | Status: DC

## 2020-01-14 NOTE — Progress Notes (Signed)
Subjective:    Patient ID: [name], female    DOB: 07/23/1940, 79 y.o.   MRN: [id number]  HPI Virtual Visit via Telephone Note  I connected with the patient on 01/14/20 at 11:30 AM EDT by telephone and verified that I am speaking with the correct person using two identifiers.   I discussed the limitations, risks, security and privacy concerns of performing an evaluation and management service by telephone and the availability of in person appointments. I also discussed with the patient that there may be a patient responsible charge related to this service. The patient expressed understanding and agreed to proceed.  Location patient: home Location provider: work or home office Participants present for the call: patient, provider Patient did not have a visit in the prior 7 days to address this/these issue(s).   History of Present Illness: Here for pain management. She is doing well.  Indication for chronic opioid: low back pain  Medication and dose: Norco 10-325 # pills per month: 120 Last UDS date: 01-13-20 Opioid Treatment Agreement signed (Y/N): 10-28-18 Opioid Treatment Agreement last reviewed with patient:  01-14-20 N[facility] reviewed this encounter (include red flags): Yes    Observations/Objective: Patient sounds cheerful and well on the phone. I do not appreciate any SOB. Speech and thought processing are grossly intact. Patient reported vitals:  Assessment and Plan: Pain management, meds were refilled.  Alysia Penna, MD   Follow Up Instructions:     [phone] 11-20 [phone] 21-30 I did not refer this patient for an OV in the next 24 hours for this/these issue(s).  I discussed the assessment and treatment plan with the patient. The patient was provided an opportunity to ask questions and all were answered. The patient agreed with the plan and demonstrated an understanding of the instructions.   The patient was advised to call back or seek an in-person  evaluation if the symptoms worsen or if the condition fails to improve as anticipated.  I provided 14 minutes of non-face-to-face time during this encounter.   Alysia Penna, MD    Review of Systems     Objective:   Physical Exam        Assessment & Plan:

## 2020-01-15 LAB — DRUG MONITOR, PANEL 1, W/CONF, URINE
Alphahydroxyalprazolam: 171 ng/mL — ABNORMAL HIGH (ref <25)
Alphahydroxymidazolam: NEGATIVE ng/mL (ref <50)
Alphahydroxytriazolam: NEGATIVE ng/mL (ref <50)
Aminoclonazepam: NEGATIVE ng/mL (ref <25)
Amphetamines: NEGATIVE ng/mL (ref <500)
Barbiturates: NEGATIVE ng/mL (ref <300)
Benzodiazepines: POSITIVE ng/mL — AB (ref <100)
Cocaine Metabolite: NEGATIVE ng/mL (ref <150)
Codeine: NEGATIVE ng/mL (ref <50)
Creatinine: 78.5 mg/dL
Hydrocodone: 501 ng/mL — ABNORMAL HIGH (ref <50)
Hydromorphone: 495 ng/mL — ABNORMAL HIGH (ref <50)
Hydroxyethylflurazepam: NEGATIVE ng/mL (ref <50)
Lorazepam: NEGATIVE ng/mL (ref <50)
Marijuana Metabolite: NEGATIVE ng/mL (ref <20)
Methadone Metabolite: NEGATIVE ng/mL (ref <100)
Morphine: NEGATIVE ng/mL (ref <50)
Nordiazepam: NEGATIVE ng/mL (ref <50)
Norhydrocodone: 659 ng/mL — ABNORMAL HIGH (ref <50)
Opiates: POSITIVE ng/mL — AB (ref <100)
Oxazepam: NEGATIVE ng/mL (ref <50)
Oxidant: NEGATIVE ug/mL
Oxycodone: NEGATIVE ng/mL (ref <100)
Phencyclidine: NEGATIVE ng/mL (ref <25)
Temazepam: NEGATIVE ng/mL (ref <50)
pH: 5.8 (ref 4.5–9.0)

## 2020-01-15 LAB — DM TEMPLATE

## 2020-01-18 ENCOUNTER — Telehealth: Payer: Self-pay | Admitting: Gastroenterology

## 2020-01-18 ENCOUNTER — Other Ambulatory Visit: Payer: Self-pay | Admitting: Gastroenterology

## 2020-01-18 NOTE — Telephone Encounter (Signed)
Informed patient that I already sent her refill of Lomotil to her pharmacy but informed patient she is due for follow up. Patient scheduled appt on 03/01/20 at 1:50pm

## 2020-01-18 NOTE — Telephone Encounter (Signed)
OK to refill with enough until REV in near future

## 2020-01-18 NOTE — Telephone Encounter (Signed)
Can we refill Lomotil prescription Dr. Fuller Plan?

## 2020-01-18 NOTE — Telephone Encounter (Signed)
Prescription sent to pharmacy. Informed patient on prescription to make appt for further refills.

## 2020-01-26 DIAGNOSIS — E559 Vitamin D deficiency, unspecified: Secondary | ICD-10-CM | POA: Diagnosis not present

## 2020-01-26 DIAGNOSIS — M810 Age-related osteoporosis without current pathological fracture: Secondary | ICD-10-CM | POA: Diagnosis not present

## 2020-01-26 DIAGNOSIS — M1611 Unilateral primary osteoarthritis, right hip: Secondary | ICD-10-CM | POA: Diagnosis not present

## 2020-01-26 DIAGNOSIS — M48061 Spinal stenosis, lumbar region without neurogenic claudication: Secondary | ICD-10-CM | POA: Diagnosis not present

## 2020-02-01 DIAGNOSIS — M1611 Unilateral primary osteoarthritis, right hip: Secondary | ICD-10-CM | POA: Diagnosis not present

## 2020-02-01 DIAGNOSIS — M25551 Pain in right hip: Secondary | ICD-10-CM | POA: Diagnosis not present

## 2020-02-04 DIAGNOSIS — Z780 Asymptomatic menopausal state: Secondary | ICD-10-CM | POA: Diagnosis not present

## 2020-02-06 ENCOUNTER — Other Ambulatory Visit: Payer: Self-pay | Admitting: Gastroenterology

## 2020-02-15 ENCOUNTER — Other Ambulatory Visit: Payer: Self-pay | Admitting: Cardiology

## 2020-02-16 ENCOUNTER — Telehealth: Payer: Medicare Other

## 2020-02-18 ENCOUNTER — Telehealth: Payer: Self-pay

## 2020-02-18 NOTE — Telephone Encounter (Signed)
Patient called and advised normal bone density for the Bone Density report per Dr. Sarajane Jews noted on 02/18/20.

## 2020-02-23 DIAGNOSIS — M1611 Unilateral primary osteoarthritis, right hip: Secondary | ICD-10-CM | POA: Diagnosis not present

## 2020-02-23 DIAGNOSIS — M47816 Spondylosis without myelopathy or radiculopathy, lumbar region: Secondary | ICD-10-CM | POA: Diagnosis not present

## 2020-03-01 ENCOUNTER — Ambulatory Visit (INDEPENDENT_AMBULATORY_CARE_PROVIDER_SITE_OTHER): Payer: PPO | Admitting: Gastroenterology

## 2020-03-01 ENCOUNTER — Other Ambulatory Visit: Payer: Self-pay | Admitting: Gastroenterology

## 2020-03-01 ENCOUNTER — Encounter: Payer: Self-pay | Admitting: Gastroenterology

## 2020-03-01 VITALS — BP 124/70 | HR 90 | Ht 60.0 in | Wt 208.0 lb

## 2020-03-01 DIAGNOSIS — K8689 Other specified diseases of pancreas: Secondary | ICD-10-CM | POA: Diagnosis not present

## 2020-03-01 DIAGNOSIS — K51519 Left sided colitis with unspecified complications: Secondary | ICD-10-CM

## 2020-03-01 DIAGNOSIS — K219 Gastro-esophageal reflux disease without esophagitis: Secondary | ICD-10-CM

## 2020-03-01 MED ORDER — ZENPEP 20000-63000 UNITS PO CPEP: ORAL_CAPSULE | ORAL | 11 refills | Status: DC

## 2020-03-01 MED ORDER — MESALAMINE 1.2 G PO TBEC: 2.4000 g | DELAYED_RELEASE_TABLET | Freq: Two times a day (BID) | ORAL | 11 refills | Status: DC

## 2020-03-01 NOTE — Patient Instructions (Signed)
We have sent the following medications to your pharmacy for you to pick up at your convenience: mesalamine and Zenpep.  Thank you for choosing me and [facility].  Pricilla Riffle. Dagoberto Ligas., MD., Mar[facility] Regal

## 2020-03-01 NOTE — Progress Notes (Signed)
History of Present Illness: This is a 79 year old female returning for follow-up of pancreatic insufficiency, IBS-D and left-sided ulcerative colitis.  She is accompanied by her grandson.  She relates she has been doing well for the past several weeks since restarting mesalamine and one additional medication however she cannot recall the other medication.  It might have been glycopyrrolate or Lomotil.  She does not have a detailed knowledge of her medications.  She states she has about two bowel movements per day.  Current Medications, Allergies, Past Medical History, Past Surgical History, Family History and Social History were reviewed in [employer] record.   Physical Exam: General: Well developed, well nourished, no acute distress Head: Normocephalic and atraumatic Eyes:  sclerae anicteric, EOMI Ears: Normal auditory acuity Mouth: Not examined, mask on during Covid-19 pandemic Lungs: Clear throughout to auscultation Heart: Regular rate and rhythm; no murmurs, rubs or bruits Abdomen: Soft, non tender and non distended. No masses, hepatosplenomegaly or hernias noted. Normal Bowel sounds Rectal: Not done Musculoskeletal: Symmetrical with no gross deformities  Pulses:  Normal pulses noted Extremities: No clubbing, cyanosis, edema or deformities noted Neurological: Alert oriented x 4, grossly nonfocal Psychological:  Alert and cooperative. Normal mood and affect   Assessment and Recommendations:  1.  Pancreatic insufficiency.  Continue fat modified diet.  Continue Zenpep 20,000U lipase 2 po with meals and 1 with snacks. REV in 6 months.   2.  Left-sided ulcerative colitis, history of SSP and TA colon polyps.  Continue Lialda 2.4 g twice daily.  Surveillance colonoscopy recommended in September 2022. REV in 6 months.   3. IBS-D.  Continue glycopyrrolate 1 mg p.o. twice daily.  Imodium 1-2 po tid prn diarrhea. Advised patient to discuss a temporary hold of  Metformin and then Mag Ox with Dr. Sarajane Jews to assess if they are contributing to diarrhea.   4. GERD.  Follow antireflux measures.  Continue pantoprazole 40 mg twice daily.  5. IDA. Hgb increased to 12.5 in May 2021. Discontinue iron and monitor CBC per Dr. Sarajane Jews.

## 2020-03-11 ENCOUNTER — Encounter: Payer: Self-pay | Admitting: Cardiology

## 2020-03-11 ENCOUNTER — Ambulatory Visit (INDEPENDENT_AMBULATORY_CARE_PROVIDER_SITE_OTHER): Payer: PPO | Admitting: Cardiology

## 2020-03-11 ENCOUNTER — Other Ambulatory Visit: Payer: Self-pay

## 2020-03-11 VITALS — BP 120/60 | HR 66 | Ht 60.0 in | Wt 205.0 lb

## 2020-03-11 DIAGNOSIS — I2583 Coronary atherosclerosis due to lipid rich plaque: Secondary | ICD-10-CM | POA: Diagnosis not present

## 2020-03-11 DIAGNOSIS — E785 Hyperlipidemia, unspecified: Secondary | ICD-10-CM

## 2020-03-11 DIAGNOSIS — I2510 Atherosclerotic heart disease of native coronary artery without angina pectoris: Secondary | ICD-10-CM | POA: Diagnosis not present

## 2020-03-11 DIAGNOSIS — I10 Essential (primary) hypertension: Secondary | ICD-10-CM

## 2020-03-11 DIAGNOSIS — I209 Angina pectoris, unspecified: Secondary | ICD-10-CM | POA: Diagnosis not present

## 2020-03-11 NOTE — Patient Instructions (Signed)
Medication Instructions:  The current medical regimen is effective;  continue present plan and medications.  *If you need a refill on your cardiac medications before your next appointment, please call your pharmacy*  Follow-Up: At [facility], you and your health needs are our priority.  As part of our continuing mission to provide you with exceptional heart care, we have created designated Provider Care Teams.  These Care Teams include your primary Cardiologist (physician) and Advanced Practice Providers (APPs -  Physician Assistants and Nurse Practitioners) who all work together to provide you with the care you need, when you need it.  We recommend signing up for the patient portal called "MyChart".  Sign up information is provided on this After Visit Summary.  MyChart is used to connect with patients for Virtual Visits (Telemedicine).  Patients are able to view lab/test results, encounter notes, upcoming appointments, etc.  Non-urgent messages can be sent to your provider as well.   To learn more about what you can do with MyChart, go to [email or web address].    Your next appointment:   12 month(s)  The format for your next appointment:   In Person  Provider:   Candee Furbish, MD   Thank you for choosing [facility]!!

## 2020-03-11 NOTE — Progress Notes (Signed)
Cardiology Office Note:    Date:  03/11/2020   ID:  [name], DOB 29-Mar-1941, MRN [id number]  PCP:  [name], MD  [facility] Cardiologist:  Candee Furbish, MD  [facility] Electrophysiologist:  None   Referring MD: [name], MD     History of Present Illness:    [name] is a 79 y.o. female here for follow-up of CAD.  Last visit with Truitt Merle reviewed.  Had CAD with bare-metal stent 3.5 x 20 mm express placed in the proximal LAD with 3 subsequent catheterization showing patent stent.  Remaining Plavix.  Intolerant to aspirin.  Ulcerative colitis.  Occasional NTG use. Lay on left side and notes palpitations.  No significant anginal symptoms with heavy exertional activity.  Her son is with her today.  He is on dialysis.  He states that he has a donor possibility.  This is great for him.  Stable.  No fevers chills nausea vomiting syncope bleeding.  Past Medical History:  Diagnosis Date  . Adenomatous colon polyp 02/2006  . Allergy   . Allergy, unspecified not elsewhere classified   . Anemia   . Anxiety   . Arthritis   . Asthma   . Blood transfusion without reported diagnosis   . Bronchiectasis   . CAD (coronary artery disease)    BMS to the LAD, 2002; cath 12/07/11 patent LAD stent and mild nonobstructive disease, EF 65%; Medical management  . Cataract    removed both eyes  . CHF (congestive heart failure) (HCC)    Diastolic  . Clotting disorder (HCC)    in legs per pt   . Diabetes mellitus   . Diverticulosis of colon   . DJD (degenerative joint disease)   . Fibromyalgia   . GERD (gastroesophageal reflux disease)   . Hypercholesterolemia   . Hypertension   . Microscopic hematuria   . Neoplasm of kidney   . Obesity   . Other diseases of lung, not elsewhere classified   . Pinched vertebral nerve   . Pulmonary nodule    Negative PET in 2010  . Stress incontinence, female   . Syncope   . Ulcerative colitis, left sided ([place]) 2008    Hx of  . UTI (lower urinary tract infection)   . Vitamin D deficiency disease     Past Surgical History:  Procedure Laterality Date  . ABDOMINAL HYSTERECTOMY    . CARDIAC CATHETERIZATION  11/2011  . CATARACT EXTRACTION, BILATERAL  2020  . CHOLECYSTECTOMY    . COLONOSCOPY    . COLONOSCOPY W/ BIOPSIES  11-12-12   per Dr. Fuller Plan, diverticulosis only, repeat in 5 yrs  . CORONARY STENT PLACEMENT  2002   BMS to the LAD  . ESOPHAGOGASTRODUODENOSCOPY  12/2008  . HAND SURGERY  01/2006   Right Hand Surgery by Dr. Amedeo Plenty  . LEFT HEART CATHETERIZATION WITH CORONARY ANGIOGRAM N/A 12/07/2011   Procedure: LEFT HEART CATHETERIZATION WITH CORONARY ANGIOGRAM;  Surgeon: Peter M Martinique, MD;  Location[facility] C[facility] LAB;  Service: Cardiovascular;  Laterality: N/A;  . POLYPECTOMY    . UPPER GASTROINTESTINAL ENDOSCOPY      Current Medications: Current Meds  Medication Sig  . ACCU-CHEK FASTCLIX LANCETS MISC Test once per day and diagnosis code is E 11.9  . acetaminophen (TYLENOL) 500 MG tablet Take 500 mg by mouth every 6 (six) hours as needed for headache (pain).   Marland Kitchen albuterol (PROVENTIL) (2.5 MG/3ML) 0.083% nebulizer solution Take 3 mLs (2.5 mg total) by nebulization  every 4 (four) hours as needed for wheezing or shortness of breath.  Marland Kitchen albuterol (VENTOLIN HFA) 108 (90 BASE) MCG/ACT inhaler Inhale 2 puffs into the lungs every 4 (four) hours as needed for wheezing or shortness of breath.  . ALPRAZolam (XANAX) 0.5 MG tablet Take 1 tablet (0.5 mg total) by mouth 3 (three) times daily as needed.  Marland Kitchen amLODipine (NORVASC) 10 MG tablet TAKE 1 TABLET BY MOUTH EVERY DAY  . atenolol (TENORMIN) 50 MG tablet TAKE 1 TABLET BY MOUTH EVERY DAY  . atorvastatin (LIPITOR) 20 MG tablet Take 1 tablet (20 mg total) by mouth daily.  Marland Kitchen azelastine (ASTELIN) 0.1 % nasal spray Place 1 spray into both nostrils at bedtime.  . budesonide-formoterol (SYMBICORT) 160-4.5 MCG/ACT inhaler Inhale 2 puffs into the lungs 2 (two) times daily.  .  Cholecalciferol (VITAMIN D-3) 5000 UNITS TABS Take 5,000 Units by mouth daily.    . clopidogrel (PLAVIX) 75 MG tablet TAKE 1 TABLET BY MOUTH EVERY DAY  . clotrimazole (LOTRIMIN) 1 % external solution Apply 1 application topically 2 (two) times daily. In between toes  . diclofenac sodium (VOLTAREN) 1 % GEL As needed for pain  . diclofenac Sodium (VOLTAREN) 1 % GEL   . diphenoxylate-atropine (LOMOTIL) 2.5-0.025 MG tablet TAKE 1 TABLET BY MOUTH 3 TIMES A DAY AS NEEDED FOR DIARRHEA OR LOOSE STOOLS  . Ferrous Sulfate (IRON) 325 (65 Fe) MG TABS Take 1 tablet (325 mg total) by mouth 2 (two) times daily.  . fluticasone (FLONASE) 50 MCG/ACT nasal spray PLACE 2 SPRAYS IN EACH NOSTRIL AT BEDTIME  . furosemide (LASIX) 40 MG tablet TAKE 1 TABLET (40 MG TOTAL) BY MOUTH 2 (TWO) TIMES DAILY.  Marland Kitchen gabapentin (NEURONTIN) 600 MG tablet Take 1 tablet (600 mg total) by mouth 3 (three) times daily.  Marland Kitchen glimepiride (AMARYL) 1 MG tablet TAKE 1 TABLET BY MOUTH EVERY DAY BEFORE BREAKFAST  . glucose blood (ACCU-CHEK AVIVA PLUS) test strip TEST ONCE PER DAY AND DIAGNOSIS CODE IS E 11.9  . glycopyrrolate (ROBINUL) 1 MG tablet Take 1 tablet (1 mg total) by mouth 2 (two) times daily.  Derrill Memo ON 03/15/2020] HYDROcodone-acetaminophen (NORCO) 10-325 MG tablet Take 1 tablet by mouth every 6 (six) hours as needed for moderate pain.  Marland Kitchen HYDROcodone-homatropine (HYDROMET) 5-1.5 MG/5ML syrup Take 5 mLs by mouth every 4 (four) hours as needed.  . hydrOXYzine (ATARAX/VISTARIL) 25 MG tablet TAKE 1 TABLET EVERY 4 HOURS AS NEEDED FOR ITCHING  . hydrOXYzine (VISTARIL) 25 MG capsule TAKE 1 CAPSULE (25 MG TOTAL) BY MOUTH EVERY 6 (SIX) HOURS AS NEEDED.  Marland Kitchen isosorbide mononitrate (IMDUR) 60 MG 24 hr tablet TAKE 1 TABLET BY MOUTH EVERY DAY  . ketoconazole (NIZORAL) 2 % cream Apply 1 application topically daily.  Marland Kitchen levocetirizine (XYZAL) 5 MG tablet TAKE 1 TABLET BY MOUTH EVERY DAY IN THE EVENING  . LIDODERM 5 % APPLY 1 PATCH TO SKIN FOR 12 HOURS  THEN REMOVE AND DISCARD PATCH WITHIN 72 HOURS OR AS DIRECTED  . loperamide (IMODIUM) 2 MG capsule Take 4-6 mg by mouth daily.  Marland Kitchen losartan (COZAAR) 50 MG tablet Take 1 tablet (50 mg total) by mouth daily.  . magnesium oxide (MAG-OX) 400 MG tablet TAKE 1 TABLET BY MOUTH EVERY DAY  . mesalamine (LIALDA) 1.2 g EC tablet Take 2 tablets (2.4 g total) by mouth 2 (two) times daily.  . metFORMIN (GLUCOPHAGE) 1000 MG tablet TAKE 1 TABLET (1,000 MG TOTAL) BY MOUTH 2 (TWO) TIMES DAILY WITH A MEAL.  Marland Kitchen  methylPREDNISolone acetate (DEPO-MEDROL) 40 MG/ML injection SMARTSIG:2 Milliliter(s) IM Once PRN  . mirabegron ER (MYRBETRIQ) 25 MG TB24 tablet Take 1 tablet (25 mg total) by mouth daily.  . montelukast (SINGULAIR) 10 MG tablet Take one every morning  . nabumetone (RELAFEN) 750 MG tablet TAKE 1 TABLET BY MOUTH TWICE A DAY WITH MEAL  . neomycin-polymyxin-hydrocortisone (CORTISPORIN) otic solution Use 1 drop in affected ear three times daily as needed  . nitroGLYCERIN (NITROSTAT) 0.4 MG SL tablet PLACE 1 TABLET (0.4 MG TOTAL) UNDER THE TONGUE EVERY 5 (FIVE) MINUTES AS NEEDED FOR CHEST PAIN.  Marland Kitchen Olopatadine HCl (PAZEO) 0.7 % SOLN Place 1 drop into both eyes daily as needed (itching/allergies).  . ondansetron (ZOFRAN) 4 MG tablet Take 1 tablet (4 mg total) by mouth every 6 (six) hours as needed for nausea or vomiting.  . ondansetron (ZOFRAN-ODT) 4 MG disintegrating tablet TAKE 1 TABLET BY MOUTH EVERY 8 HOURS AS NEEDED FOR NAUSEA AND VOMITING  . Pancrelipase, Lip-Prot-Amyl, (ZENPEP) 20000-63000 units CPEP TAKE 2 CAPSULES (40,000 UNITS) WITH MEALS AND 1 CAPSULE (20,000 UNITS) WITH A SNACK  . pantoprazole (PROTONIX) 40 MG tablet Take 1 tablet (40 mg total) by mouth 2 (two) times daily.  Vladimir Faster Glycol-Propyl Glycol (SYSTANE OP) Place 1 drop into both eyes daily. For dry eyes  . potassium chloride SA (KLOR-CON M20) 20 MEQ tablet Take 1 tablet (20 mEq total) by mouth 3 (three) times daily.  Marland Kitchen tiZANidine (ZANAFLEX) 4 MG  tablet TAKE 1 TABLET BY MOUTH TWICE A DAY   Current Facility-Administered Medications for the 03/11/20 encounter (Office Visit) with Jerline Pain, MD  Medication  . ipratropium-albuterol (DUONEB) 0.5-2.5 (3) MG/3ML nebulizer solution 3 mL     Allergies:   Doxycycline, Aspirin, Azithromycin, Caffeine, Ciprofloxacin, Codeine, Oxycodone, Sulfamethizole, and Tramadol hcl   Social History   Socioeconomic History  . Marital status: Divorced    Spouse name: Not on file  . Number of children: 3  . Years of education: Not on file  . Highest education level: Not on file  Occupational History  . Occupation: Retired    Fish farm manager: RETIRED  Tobacco Use  . Smoking status: Former Smoker    Packs/day: 2.00    Years: 15.00    Pack years: 30.00    Types: Cigarettes    Quit date: 04/30/1973    Years since quitting: 46.8  . Smokeless tobacco: Never Used  . Tobacco comment: ex-smoker: smoked for 10-15 years up to 2ppd, quit 1975.  Vaping Use  . Vaping Use: Never used  Substance and Sexual Activity  . Alcohol use: Not Currently    Alcohol/week: 0.0 standard drinks    Comment: none now   . Drug use: No  . Sexual activity: Never  Other Topics Concern  . Not on file  Social History Narrative   Right handed   Social Determinants of Health   Financial Resource Strain: Low Risk   . Difficulty of Paying Living Expenses: Not hard at all  Food Insecurity:   . Worried About Charity fundraiser in the Last Year: Not on file  . Ran Out of Food in the Last Year: Not on file  Transportation Needs: No Transportation Needs  . Lack of Transportation (Medical): No  . Lack of Transportation (Non-Medical): No  Physical Activity:   . Days of Exercise per Week: Not on file  . Minutes of Exercise per Session: Not on file  Stress:   . Feeling of Stress : Not on  file  Social Connections:   . Frequency of Communication with Friends and Family: Not on file  . Frequency of Social Gatherings with Friends and  Family: Not on file  . Attends Religious Services: Not on file  . Active Member of Clubs or Organizations: Not on file  . Attends Archivist Meetings: Not on file  . Marital Status: Not on file     Family History: [name] family history includes Breast cancer in her daughter; Colon polyps in her father; Diabetes in her brother and sister; Heart attack in her father; Hypertension in her sister; Parkinsonism in her mother; Pneumonia in her father; Sarcoidosis in her brother. There is no history of Colon cancer, Esophageal cancer, Rectal cancer, or Stomach cancer.  ROS:   Please see the history of present illness.    No fevers chills nausea vomiting syncope bleeding all other systems reviewed and are negative.  EKGs/Labs/Other Studies Reviewed:     EKG:  EKG is  ordered today.  The ekg ordered today demonstrates sinus rhythm 66 with nonspecific ST-T wave changes  Recent Labs: 09/08/2019: BUN 16; Creatinine, Ser 0.59; Hemoglobin 12.5; Platelets 462; Potassium 4.7; Sodium 142 12/10/2019: ALT 6  Recent Lipid Panel    Component Value Date/Time   CHOL 127 12/10/2019 0744   TRIG 91 12/10/2019 0744   HDL 59 12/10/2019 0744   CHOLHDL 2.2 12/10/2019 0744   CHOLHDL 3 02/06/2018 1003   VLDL 23.4 02/06/2018 1003   LDLCALC 51 12/10/2019 0744     Risk Assessment/Calculations:       Physical Exam:    VS:  BP 120/60   Pulse 66   Ht 5' (1.524 m)   Wt 205 lb (93 kg)   LMP  (LMP Unknown)   BMI 40.04 kg/m     Wt Readings from Last 3 Encounters:  03/11/20 205 lb (93 kg)  03/01/20 208 lb (94.3 kg)  10/27/19 211 lb 3.2 oz (95.8 kg)     GEN:  Well nourished, well developed in no acute distress HEENT: Normal NECK: No JVD; No carotid bruits LYMPHATICS: No lymphadenopathy CARDIAC: RRR, no murmurs, rubs, gallops RESPIRATORY:  Clear to auscultation without rales, wheezing or rhonchi  ABDOMEN: Soft, non-tender, non-distended MUSCULOSKELETAL:  No edema; No deformity  SKIN:  Warm and dry NEUROLOGIC:  Alert and oriented x 3 PSYCHIATRIC:  Normal affect   ASSESSMENT:    1. Coronary artery disease due to lipid rich plaque   2. Essential hypertension   3. Hyperlipidemia, unspecified hyperlipidemia type   4. Angina pectoris ([place])    PLAN:    In order of problems listed above:  CAD -Bare-metal stent 2002.  3 cardiac catheterizations later stent has remained patent. -Nuclear stress test 2018 low risk. -Continue with medical management. -Occasional anginal symptoms.  Nitroglycerin utilized.  Atenolol as well as Imdur being utilized as well.  No heavy exertional anginal symptoms.  Continue with current medical management.  Hyperlipidemia -Excellent LDL in the 50s, atorvastatin 20 mg currently.  No myalgias.  Morbid obesity -Continue to encourage weight loss.  Challenging for chronic pain.  Diabetes with hypertension -Excellent last hemoglobin A1c of 6.6.  Outside labs reviewed.  Per Dr. Sarajane Jews.  Notes reviewed.    Shared Decision Making/Informed Consent        Medication Adjustments/Labs and Tests Ordered: Current medicines are reviewed at length with the patient today.  Concerns regarding medicines are outlined above.  Orders Placed This Encounter  Procedures  . EKG 12-Lead  No orders of the defined types were placed in this encounter.   Patient Instructions  Medication Instructions:  The current medical regimen is effective;  continue present plan and medications.  *If you need a refill on your cardiac medications before your next appointment, please call your pharmacy*  Follow-Up: At [facility], you and your health needs are our priority.  As part of our continuing mission to provide you with exceptional heart care, we have created designated Provider Care Teams.  These Care Teams include your primary Cardiologist (physician) and Advanced Practice Providers (APPs -  Physician Assistants and Nurse Practitioners) who all work together to  provide you with the care you need, when you need it.  We recommend signing up for the patient portal called "MyChart".  Sign up information is provided on this After Visit Summary.  MyChart is used to connect with patients for Virtual Visits (Telemedicine).  Patients are able to view lab/test results, encounter notes, upcoming appointments, etc.  Non-urgent messages can be sent to your provider as well.   To learn more about what you can do with MyChart, go to [email or web address].    Your next appointment:   12 month(s)  The format for your next appointment:   In Person  Provider:   Candee Furbish, MD   Thank you for choosing [facility]!!         Signed, Candee Furbish, MD  03/11/2020 2:27 PM    [place]

## 2020-03-14 ENCOUNTER — Other Ambulatory Visit: Payer: Self-pay | Admitting: Cardiology

## 2020-03-15 MED ORDER — POTASSIUM CHLORIDE CRYS ER 20 MEQ PO TBCR: 20.0000 meq | EXTENDED_RELEASE_TABLET | Freq: Three times a day (TID) | ORAL | 3 refills | Status: DC

## 2020-03-17 DIAGNOSIS — J309 Allergic rhinitis, unspecified: Secondary | ICD-10-CM | POA: Diagnosis not present

## 2020-03-17 DIAGNOSIS — J4540 Moderate persistent asthma, uncomplicated: Secondary | ICD-10-CM | POA: Diagnosis not present

## 2020-03-17 DIAGNOSIS — K219 Gastro-esophageal reflux disease without esophagitis: Secondary | ICD-10-CM | POA: Diagnosis not present

## 2020-03-21 ENCOUNTER — Telehealth: Payer: Self-pay | Admitting: Family Medicine

## 2020-03-21 NOTE — Telephone Encounter (Signed)
Called number provided number not in service

## 2020-03-31 ENCOUNTER — Other Ambulatory Visit: Payer: Self-pay | Admitting: Gastroenterology

## 2020-03-31 ENCOUNTER — Other Ambulatory Visit: Payer: Self-pay | Admitting: Family Medicine

## 2020-03-31 NOTE — Telephone Encounter (Signed)
Thanks Brooke. Prescription refill sent to patient's pharmacy.

## 2020-03-31 NOTE — Telephone Encounter (Signed)
Hey I got this medication refill for zofran 31m to taking every 8 hours prn. 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

## 2020-04-04 ENCOUNTER — Other Ambulatory Visit: Payer: Self-pay | Admitting: Family Medicine

## 2020-04-13 DIAGNOSIS — M47816 Spondylosis without myelopathy or radiculopathy, lumbar region: Secondary | ICD-10-CM | POA: Diagnosis not present

## 2020-04-14 ENCOUNTER — Other Ambulatory Visit: Payer: Self-pay | Admitting: Family Medicine

## 2020-04-21 ENCOUNTER — Other Ambulatory Visit: Payer: Self-pay | Admitting: Gastroenterology

## 2020-05-03 ENCOUNTER — Other Ambulatory Visit: Payer: Self-pay | Admitting: Gastroenterology

## 2020-05-10 ENCOUNTER — Ambulatory Visit: Payer: PPO

## 2020-05-11 ENCOUNTER — Other Ambulatory Visit: Payer: Self-pay

## 2020-05-11 ENCOUNTER — Ambulatory Visit (INDEPENDENT_AMBULATORY_CARE_PROVIDER_SITE_OTHER): Payer: PPO

## 2020-05-11 DIAGNOSIS — Z0000 Encounter for general adult medical examination without abnormal findings: Secondary | ICD-10-CM

## 2020-05-11 NOTE — Patient Instructions (Signed)
[name] , Thank you for taking time to come for your Medicare Wellness Visit. I appreciate your ongoing commitment to your health goals. Please review the following plan we discussed and let me know if I can assist you in the future.   Screening recommendations/referrals: Colonoscopy: Up to date, next due 01/10/2023 Mammogram: Currently due, please let us know when you would like for [place] to order mammogram Bone Density: No longer required  Recommended yearly ophthalmology/optometry visit for glaucoma screening and checkup Recommended yearly dental visit for hygiene and checkup  Vaccinations: Influenza vaccine: Patient declined  Pneumococcal vaccine: Completed series  Tdap vaccine: Currently due, if you wish to receive we recommend that you contact your insurance company to discuss cost or you may await and injury  Shingles vaccine: Currently due for Shingrix, if you wish to receive we recommend that you do so at your local pharmacy     Advanced directives: Advance directive discussed with you today. Even though you declined this today please call our office should you change your mind and we can give you the proper paperwork for you to fill out.   Conditions/risks identified: None   Next appointment: 05/12/2021 @ 9:45 am with [place] 65 Years and Older, Female Preventive care refers to lifestyle choices and visits with your health care provider that can promote health and wellness. What does preventive care include?  A yearly physical exam. This is also called an annual well check.  Dental exams once or twice a year.  Routine eye exams. Ask your health care provider how often you should have your eyes checked.  Personal lifestyle choices, including:  Daily care of your teeth and gums.  Regular physical activity.  Eating a healthy diet.  Avoiding tobacco and drug use.  Limiting alcohol use.  Practicing safe sex.  Taking low-dose  aspirin every day.  Taking vitamin and mineral supplements as recommended by your health care provider. What happens during an annual well check? The services and screenings done by your health care provider during your annual well check will depend on your age, overall health, lifestyle risk factors, and family history of disease. Counseling  Your health care provider may ask you questions about your:  Alcohol use.  Tobacco use.  Drug use.  Emotional well-being.  Home and relationship well-being.  Sexual activity.  Eating habits.  History of falls.  Memory and ability to understand (cognition).  Work and work Statistician.  Reproductive health. Screening  You may have the following tests or measurements:  Height, weight, and BMI.  Blood pressure.  Lipid and cholesterol levels. These may be checked every 5 years, or more frequently if you are over 18 years old.  Skin check.  Lung cancer screening. You may have this screening every year starting at age 47 if you have a 30-pack-year history of smoking and currently smoke or have quit within the past 15 years.  Fecal occult blood test (FOBT) of the stool. You may have this test every year starting at age 16.  Flexible sigmoidoscopy or colonoscopy. You may have a sigmoidoscopy every 5 years or a colonoscopy every 10 years starting at age 84.  Hepatitis C blood test.  Hepatitis B blood test.  Sexually transmitted disease (STD) testing.  Diabetes screening. This is done by checking your blood sugar (glucose) after you have not eaten for a while (fasting). You may have this done every 1-3 years.  Bone density scan. This is  done to screen for osteoporosis. You may have this done starting at age 69.  Mammogram. This may be done every 1-2 years. Talk to your health care provider about how often you should have regular mammograms. Talk with your health care provider about your test results, treatment options, and if  necessary, the need for more tests. Vaccines  Your health care provider may recommend certain vaccines, such as:  Influenza vaccine. This is recommended every year.  Tetanus, diphtheria, and acellular pertussis (Tdap, Td) vaccine. You may need a Td booster every 10 years.  Zoster vaccine. You may need this after age 74.  Pneumococcal 13-valent conjugate (PCV13) vaccine. One dose is recommended after age 39.  Pneumococcal polysaccharide (PPSV23) vaccine. One dose is recommended after age 32. Talk to your health care provider about which screenings and vaccines you need and how often you need them. This information is not intended to replace advice given to you by your health care provider. Make sure you discuss any questions you have with your health care provider. Document Released: 05/13/2015 Document Revised: 01/04/2016 Document Reviewed: 02/15/2015 Elsevier Interactive Patient Education  2017 [employer] Prevention in the Home Falls can cause injuries. They can happen to people of all ages. There are many things you can do to make your home safe and to help prevent falls. What can I do on the outside of my home?  Regularly fix the edges of walkways and driveways and fix any cracks.  Remove anything that might make you trip as you walk through a door, such as a raised step or threshold.  Trim any bushes or trees on the path to your home.  Use bright outdoor lighting.  Clear any walking paths of anything that might make someone trip, such as rocks or tools.  Regularly check to see if handrails are loose or broken. Make sure that both sides of any steps have handrails.  Any raised decks and porches should have guardrails on the edges.  Have any leaves, snow, or ice cleared regularly.  Use sand or salt on walking paths during winter.  Clean up any spills in your garage right away. This includes oil or grease spills. What can I do in the bathroom?  Use night  lights.  Install grab bars by the toilet and in the tub and shower. Do not use towel bars as grab bars.  Use non-skid mats or decals in the tub or shower.  If you need to sit down in the shower, use a plastic, non-slip stool.  Keep the floor dry. Clean up any water that spills on the floor as soon as it happens.  Remove soap buildup in the tub or shower regularly.  Attach bath mats securely with double-sided non-slip rug tape.  Do not have throw rugs and other things on the floor that can make you trip. What can I do in the bedroom?  Use night lights.  Make sure that you have a light by your bed that is easy to reach.  Do not use any sheets or blankets that are too big for your bed. They should not hang down onto the floor.  Have a firm chair that has side arms. You can use this for support while you get dressed.  Do not have throw rugs and other things on the floor that can make you trip. What can I do in the kitchen?  Clean up any spills right away.  Avoid walking on wet floors.  Keep  items that you use a lot in easy-to-reach places.  If you need to reach something above you, use a strong step stool that has a grab bar.  Keep electrical cords out of the way.  Do not use floor polish or wax that makes floors slippery. If you must use wax, use non-skid floor wax.  Do not have throw rugs and other things on the floor that can make you trip. What can I do with my stairs?  Do not leave any items on the stairs.  Make sure that there are handrails on both sides of the stairs and use them. Fix handrails that are broken or loose. Make sure that handrails are as long as the stairways.  Check any carpeting to make sure that it is firmly attached to the stairs. Fix any carpet that is loose or worn.  Avoid having throw rugs at the top or bottom of the stairs. If you do have throw rugs, attach them to the floor with carpet tape.  Make sure that you have a light switch at the  top of the stairs and the bottom of the stairs. If you do not have them, ask someone to add them for you. What else can I do to help prevent falls?  Wear shoes that:  Do not have high heels.  Have rubber bottoms.  Are comfortable and fit you well.  Are closed at the toe. Do not wear sandals.  If you use a stepladder:  Make sure that it is fully opened. Do not climb a closed stepladder.  Make sure that both sides of the stepladder are locked into place.  Ask someone to hold it for you, if possible.  Clearly mark and make sure that you can see:  Any grab bars or handrails.  First and last steps.  Where the edge of each step is.  Use tools that help you move around (mobility aids) if they are needed. These include:  Canes.  Walkers.  Scooters.  Crutches.  Turn on the lights when you go into a dark area. Replace any light bulbs as soon as they burn out.  Set up your furniture so you have a clear path. Avoid moving your furniture around.  If any of your floors are uneven, fix them.  If there are any pets around you, be aware of where they are.  Review your medicines with your doctor. Some medicines can make you feel dizzy. This can increase your chance of falling. Ask your doctor what other things that you can do to help prevent falls. This information is not intended to replace advice given to you by your health care provider. Make sure you discuss any questions you have with your health care provider. Document Released: 02/10/2009 Document Revised: 09/22/2015 Document Reviewed: 05/21/2014 Elsevier Interactive Patient Education  2017 [employer].

## 2020-05-11 NOTE — Progress Notes (Signed)
Subjective:   [name] is a 80 y.o. female who presents for an Initial Medicare Annual Wellness Visit.  I connected with Shalva Rozycki today by telephone and verified that I am speaking with the correct person using two identifiers. Location patient: home Location provider: work Persons participating in the virtual visit: patient, provider.   I discussed the limitations, risks, security and privacy concerns of performing an evaluation and management service by telephone and the availability of in person appointments. I also discussed with the patient that there may be a patient responsible charge related to this service. The patient expressed understanding and verbally consented to this telephonic visit.    Interactive audio and video telecommunications were attempted between this provider and patient, however failed, due to patient having technical difficulties OR patient did not have access to video capability.  We continued and completed visit with audio only.      Review of Systems    N/A  Cardiac Risk Factors include: advanced age (>33mn, >>63women);diabetes mellitus;dyslipidemia;hypertension     Objective:    Today's Vitals   05/11/20 0947  PainSc: 10-Worst pain ever   There is no height or weight on file to calculate BMI.  Advanced Directives 05/11/2020 06/08/2019 12/16/2016 12/16/2016 06/24/2014 06/24/2014 12/07/2011  Does Patient Have a Medical Advance Directive? No No No No - No Patient does not have advance directive;Patient would like information  Would patient like information on creating a medical advance directive? No - Patient declined - No - Patient declined - No - patient declined information No - patient declined information -  Pre-existing out of facility DNR order (yellow form or pink MOST form) - - - - - - No    Current Medications (verified) Outpatient Encounter Medications as of 05/11/2020  Medication Sig  . ACCU-CHEK FASTCLIX LANCETS MISC Test once per  day and diagnosis code is E 11.9  . acetaminophen (TYLENOL) 500 MG tablet Take 500 mg by mouth every 6 (six) hours as needed for headache (pain).   .Marland Kitchenalbuterol (PROVENTIL) (2.5 MG/3ML) 0.083% nebulizer solution Take 3 mLs (2.5 mg total) by nebulization every 4 (four) hours as needed for wheezing or shortness of breath.  .Marland Kitchenalbuterol (VENTOLIN HFA) 108 (90 BASE) MCG/ACT inhaler Inhale 2 puffs into the lungs every 4 (four) hours as needed for wheezing or shortness of breath.  . ALPRAZolam (XANAX) 0.5 MG tablet Take 1 tablet (0.5 mg total) by mouth 3 (three) times daily as needed.  .Marland KitchenamLODipine (NORVASC) 10 MG tablet TAKE 1 TABLET BY MOUTH EVERY DAY  . atenolol (TENORMIN) 50 MG tablet TAKE 1 TABLET BY MOUTH EVERY DAY  . atorvastatin (LIPITOR) 20 MG tablet Take 1 tablet (20 mg total) by mouth daily.  .Marland Kitchenazelastine (ASTELIN) 0.1 % nasal spray Place 1 spray into both nostrils at bedtime.  . budesonide-formoterol (SYMBICORT) 160-4.5 MCG/ACT inhaler Inhale 2 puffs into the lungs 2 (two) times daily.  . Cholecalciferol (VITAMIN D-3) 5000 UNITS TABS Take 5,000 Units by mouth daily.  . clopidogrel (PLAVIX) 75 MG tablet TAKE 1 TABLET BY MOUTH EVERY DAY  . clotrimazole (LOTRIMIN) 1 % external solution Apply 1 application topically 2 (two) times daily. In between toes  . diclofenac sodium (VOLTAREN) 1 % GEL As needed for pain  . diclofenac Sodium (VOLTAREN) 1 % GEL   . diphenoxylate-atropine (LOMOTIL) 2.5-0.025 MG tablet TAKE 1 TABLET BY MOUTH THREE TIMES A DAY AS NEEDED FOR DIARRHEA OR LOOSE STOOLS  . Ferrous Sulfate (IRON) 325 (65  Fe) MG TABS Take 1 tablet (325 mg total) by mouth 2 (two) times daily.  . fluticasone (FLONASE) 50 MCG/ACT nasal spray PLACE 2 SPRAYS IN EACH NOSTRIL AT BEDTIME  . furosemide (LASIX) 40 MG tablet TAKE 1 TABLET (40 MG TOTAL) BY MOUTH 2 (TWO) TIMES DAILY.  Marland Kitchen gabapentin (NEURONTIN) 600 MG tablet Take 1 tablet (600 mg total) by mouth 3 (three) times daily.  Marland Kitchen glimepiride (AMARYL) 1 MG  tablet TAKE 1 TABLET BY MOUTH EVERY DAY BEFORE BREAKFAST  . glucose blood (ACCU-CHEK AVIVA PLUS) test strip TEST ONCE PER DAY AND DIAGNOSIS CODE IS E 11.9  . glycopyrrolate (ROBINUL) 1 MG tablet Take 1 tablet (1 mg total) by mouth 2 (two) times daily.  Marland Kitchen HYDROcodone-homatropine (HYDROMET) 5-1.5 MG/5ML syrup Take 5 mLs by mouth every 4 (four) hours as needed.  . hydrOXYzine (ATARAX/VISTARIL) 25 MG tablet TAKE 1 TABLET EVERY 4 HOURS AS NEEDED FOR ITCHING  . hydrOXYzine (VISTARIL) 25 MG capsule TAKE 1 CAPSULE (25 MG TOTAL) BY MOUTH EVERY 6 (SIX) HOURS AS NEEDED.  Marland Kitchen isosorbide mononitrate (IMDUR) 60 MG 24 hr tablet TAKE 1 TABLET BY MOUTH EVERY DAY  . ketoconazole (NIZORAL) 2 % cream Apply 1 application topically daily.  Marland Kitchen levocetirizine (XYZAL) 5 MG tablet TAKE 1 TABLET BY MOUTH EVERY DAY IN THE EVENING  . LIDODERM 5 % APPLY 1 PATCH TO SKIN FOR 12 HOURS THEN REMOVE AND DISCARD PATCH WITHIN 72 HOURS OR AS DIRECTED  . loperamide (IMODIUM) 2 MG capsule Take 4-6 mg by mouth daily.  Marland Kitchen losartan (COZAAR) 50 MG tablet Take 1 tablet (50 mg total) by mouth daily.  . magnesium oxide (MAG-OX) 400 MG tablet TAKE 1 TABLET BY MOUTH EVERY DAY  . mesalamine (LIALDA) 1.2 g EC tablet Take 2 tablets (2.4 g total) by mouth 2 (two) times daily.  . metFORMIN (GLUCOPHAGE) 1000 MG tablet TAKE 1 TABLET (1,000 MG TOTAL) BY MOUTH 2 (TWO) TIMES DAILY WITH A MEAL.  . methylPREDNISolone acetate (DEPO-MEDROL) 40 MG/ML injection SMARTSIG:2 Milliliter(s) IM Once PRN  . mirabegron ER (MYRBETRIQ) 25 MG TB24 tablet Take 1 tablet (25 mg total) by mouth daily.  . montelukast (SINGULAIR) 10 MG tablet Take one every morning  . nabumetone (RELAFEN) 750 MG tablet TAKE 1 TABLET BY MOUTH TWICE A DAY WITH MEAL  . neomycin-polymyxin-hydrocortisone (CORTISPORIN) otic solution Use 1 drop in affected ear three times daily as needed  . Olopatadine HCl 0.7 % SOLN Place 1 drop into both eyes daily as needed (itching/allergies).  . ondansetron (ZOFRAN)  4 MG tablet Take 1 tablet (4 mg total) by mouth every 6 (six) hours as needed for nausea or vomiting.  . ondansetron (ZOFRAN-ODT) 4 MG disintegrating tablet TAKE 1 TABLET BY MOUTH EVERY 8 HOURS AS NEEDED FOR NAUSEA AND VOMITING  . Pancrelipase, Lip-Prot-Amyl, (ZENPEP) 20000-63000 units CPEP TAKE 2 CAPSULES (40,000 UNITS) WITH MEALS AND 1 CAPSULE (20,000 UNITS) WITH A SNACK  . pantoprazole (PROTONIX) 40 MG tablet TAKE 1 TABLET BY MOUTH TWICE A DAY  . Polyethyl Glycol-Propyl Glycol (SYSTANE OP) Place 1 drop into both eyes daily. For dry eyes  . potassium chloride SA (KLOR-CON M20) 20 MEQ tablet Take 1 tablet (20 mEq total) by mouth 3 (three) times daily.  Marland Kitchen tiZANidine (ZANAFLEX) 4 MG tablet TAKE 1 TABLET BY MOUTH TWICE A DAY  . nitroGLYCERIN (NITROSTAT) 0.4 MG SL tablet PLACE 1 TABLET (0.4 MG TOTAL) UNDER THE TONGUE EVERY 5 (FIVE) MINUTES AS NEEDED FOR CHEST PAIN. (Patient not taking: Reported on  05/11/2020)   Facility-Administered Encounter Medications as of 05/11/2020  Medication  . ipratropium-albuterol (DUONEB) 0.5-2.5 (3) MG/3ML nebulizer solution 3 mL    Allergies (verified) Doxycycline, Aspirin, Azithromycin, Caffeine, Ciprofloxacin, Codeine, Oxycodone, Sulfamethizole, and Tramadol hcl   History: Past Medical History:  Diagnosis Date  . Adenomatous colon polyp 02/2006  . Allergy   . Allergy, unspecified not elsewhere classified   . Anemia   . Anxiety   . Arthritis   . Asthma   . Blood transfusion without reported diagnosis   . Bronchiectasis   . CAD (coronary artery disease)    BMS to the LAD, 2002; cath 12/07/11 patent LAD stent and mild nonobstructive disease, EF 65%; Medical management  . Cataract    removed both eyes  . CHF (congestive heart failure) (HCC)    Diastolic  . Clotting disorder (HCC)    in legs per pt   . Diabetes mellitus   . Diverticulosis of colon   . DJD (degenerative joint disease)   . Fibromyalgia   . GERD (gastroesophageal reflux disease)   .  Hypercholesterolemia   . Hypertension   . Microscopic hematuria   . Neoplasm of kidney   . Obesity   . Other diseases of lung, not elsewhere classified   . Pinched vertebral nerve   . Pulmonary nodule    Negative PET in 2010  . Stress incontinence, female   . Syncope   . Ulcerative colitis, left sided ([place]) 2008   Hx of  . UTI (lower urinary tract infection)   . Vitamin D deficiency disease    Past Surgical History:  Procedure Laterality Date  . ABDOMINAL HYSTERECTOMY    . CARDIAC CATHETERIZATION  11/2011  . CATARACT EXTRACTION, BILATERAL  2020  . CHOLECYSTECTOMY    . COLONOSCOPY    . COLONOSCOPY W/ BIOPSIES  11-12-12   per Dr. Fuller Plan, diverticulosis only, repeat in 5 yrs  . CORONARY STENT PLACEMENT  2002   BMS to the LAD  . ESOPHAGOGASTRODUODENOSCOPY  12/2008  . HAND SURGERY  01/2006   Right Hand Surgery by Dr. Amedeo Plenty  . LEFT HEART CATHETERIZATION WITH CORONARY ANGIOGRAM N/A 12/07/2011   Procedure: LEFT HEART CATHETERIZATION WITH CORONARY ANGIOGRAM;  Surgeon: Peter M Martinique, MD;  Location: [facility] CATH LAB;  Service: Cardiovascular;  Laterality: N/A;  . POLYPECTOMY    . UPPER GASTROINTESTINAL ENDOSCOPY     Family History  Problem Relation Age of Onset  . Pneumonia Father   . Heart attack Father   . Colon polyps Father   . Parkinsonism Mother   . Diabetes Brother   . Sarcoidosis Brother   . Hypertension Sister   . Diabetes Sister   . Breast cancer Daughter   . Colon cancer Neg Hx   . Esophageal cancer Neg Hx   . Rectal cancer Neg Hx   . Stomach cancer Neg Hx    Social History   Socioeconomic History  . Marital status: Divorced    Spouse name: Not on file  . Number of children: 3  . Years of education: Not on file  . Highest education level: Not on file  Occupational History  . Occupation: Retired    Fish farm manager: RETIRED  Tobacco Use  . Smoking status: Former Smoker    Packs/day: 2.00    Years: 15.00    Pack years: 30.00    Types: Cigarettes    Quit date: 04/30/1973     Years since quitting: 47.0  . Smokeless tobacco: Never Used  . Tobacco  comment: ex-smoker: smoked for 10-15 years up to 2ppd, quit 1975.  Vaping Use  . Vaping Use: Never used  Substance and Sexual Activity  . Alcohol use: Not Currently    Alcohol/week: 0.0 standard drinks    Comment: none now   . Drug use: No  . Sexual activity: Never  Other Topics Concern  . Not on file  Social History Narrative   Right handed   Social Determinants of Health   Financial Resource Strain: Low Risk   . Difficulty of Paying Living Expenses: Not hard at all  Food Insecurity: No Food Insecurity  . Worried About Charity fundraiser in the Last Year: Never true  . Ran Out of Food in the Last Year: Never true  Transportation Needs: No Transportation Needs  . Lack of Transportation (Medical): No  . Lack of Transportation (Non-Medical): No  Physical Activity: Inactive  . Days of Exercise per Week: 0 days  . Minutes of Exercise per Session: 0 min  Stress: No Stress Concern Present  . Feeling of Stress : Not at all  Social Connections: Moderately Isolated  . Frequency of Communication with Friends and Family: More than three times a week  . Frequency of Social Gatherings with Friends and Family: More than three times a week  . Attends Religious Services: More than 4 times per year  . Active Member of Clubs or Organizations: No  . Attends Archivist Meetings: Never  . Marital Status: Widowed    Tobacco Counseling Counseling given: Not Answered Comment: ex-smoker: smoked for 10-15 years up to 2ppd, quit 1975.   Clinical Intake:  Pre-visit preparation completed: Yes  Pain : 0-10 Pain Score: 10-Worst pain ever Pain Type: Chronic pain Pain Location: Back Pain Descriptors / Indicators: Aching Pain Onset: More than a month ago Pain Frequency: Constant Pain Relieving Factors: ESI, pain medication  Pain Relieving Factors: ESI, pain medication  Nutritional Risks: Nausea/ vomitting/  diarrhea Diabetes: Yes CBG done?: No Did pt. bring in CBG monitor from home?: No  How often do you need to have someone help you when you read instructions, pamphlets, or other written materials from your doctor or pharmacy?: 1 - Never What is the last grade level you completed in school?: 12th grade  Diabetic?yes Nutrition Risk Assessment:  Has the patient had any N/V/D within the last 2 months?  Yes  Does the patient have any non-healing wounds?  No  Has the patient had any unintentional weight loss or weight gain?  No   Diabetes:  Is the patient diabetic?  Yes  If diabetic, was a CBG obtained today?  No  Did the patient bring in their glucometer from home?  No  How often do you monitor your CBG's? Patient states checks glucose once per day.   Financial Strains and Diabetes Management:  Are you having any financial strains with the device, your supplies or your medication? No .  Does the patient want to be seen by Chronic Care Management for management of their diabetes?  No  Would the patient like to be referred to a Nutritionist or for Diabetic Management?  No   Diabetic Exams:  Diabetic Eye Exam: Completed 08/19/2019 Diabetic Foot Exam: Overdue, Pt has been advised about the importance in completing this exam. Pt is scheduled for diabetic foot exam on next scheduled appointment with PCP .   Interpreter Needed?: No  Information entered by :: [place] of Daily Living In your present state of  health, do you have any difficulty performing the following activities: 05/11/2020  Hearing? N  Vision? N  Difficulty concentrating or making decisions? N  Walking or climbing stairs? Y  Dressing or bathing? N  Doing errands, shopping? N  Preparing Food and eating ? N  Using the Toilet? N  In the past six months, have you accidently leaked urine? Y  Do you have problems with loss of bowel control? Y  Managing your Medications? N  Managing your Finances? N   Housekeeping or managing your Housekeeping? N  Some recent data might be hidden    Patient Care Team: [name], MD as PCP - General (Family Medicine) Jerline Pain, MD as PCP - Cardiology (Cardiology) Pieter Partridge, DO as Consulting Physician (Neurology) Earnie Larsson, [facility] as Pharmacist (Pharmacist)  Indicate any recent Medical Services you may have received from other than [facility] providers in the past year (date may be approximate).     Assessment:   This is a routine wellness examination for Emmalyn.  Hearing/Vision screen  Hearing Screening   _0  _1  _2  _3  _4  _5  _6  _7  _8   Right ear:           Left ear:           Vision Screening Comments: Patient states gets eyes examined once per year. Had cataract surgery last year. Only wears reading glasses  Dietary issues and exercise activities discussed: Current Exercise Habits: The patient does not participate in regular exercise at present, Exercise limited by: orthopedic condition(s);respiratory conditions(s)  Goals    . Exercise 3x per week (30 min per time)    . Patient Stated     I would like to walk better so I don't have to use my walker     . Pharmacy Care Plan     CARE PLAN ENTRY  Current Barriers:  . Chronic Disease Management support, education, and care coordination needs related to Hypertension, Hyperlipidemia, Diabetes, Heart Failure, and Asthma   Hypertension . Pharmacist Clinical Goal(s): o Over the next 180 days, patient will work with PharmD and providers to maintain BP goal <130/80 . Current regimen:   Amlodipine 58m, 1 tablet once daily  Atenolol 585m 1 tablet once daily  Losartan 5061m1 tablet once daily  . Interventions: . Discussed diet modifications. DASH diet:  following a diet emphasizing fruits and vegetables and low-fat dairy products along with whole grains, fish, poultry, and nuts. Reducing red meats and sugars.  . Patient self care activities - Over  the next 180 days, patient will: o Check BP at home, document, and provide at future appointments  Hyperlipidemia . Pharmacist Clinical Goal(s): o Over the next 180  days, patient will work with PharmD and providers to achieve LDL goal < 70 . Current regimen:  o Atorvastatin 71m3m tablet every evening  . Interventions: o Discuss with cardiology office of increase dose of atorvastatin to 71mg72mPatMarland Kitchenent self care activities - Over the next 180 days, patient will: o Take medications as directed.   Diabetes . Pharmacist Clinical Goal(s): o Over the next 180 days, patient will work with PharmD and providers to maintain A1c goal <7% . Current regimen:   Glimepiride 1mg, 83mablet daily before breakfast  Metformin 1000mg, 66mblet twice daily with a meal . Interventions: o We discussed signs and symptoms of low blood sugars and how to treat.  . Patient self care activities - Over the next 180 days, patient will: o Check  blood sugar as directed document, and provide at future appointments o Contact provider with any episodes of hypoglycemia  Heart failure . Pharmacist Clinical Goal(s) o Over the next 180 days, patient will work with PharmD and providers to prevent hospitalizations.  . Current regimen:  o Furosemide 77m, 1 tablet twice daily  . Interventions: o We discussed weighing daily; if you gain more than 3 pounds in one day or 5 pounds in one week call your doctor . Patient self care activities - Over the next 180 days, patient will: o Continue current medications.   Asthma . Pharmacist Clinical Goal(s) o Over the next 180 days, patient will work with PharmD and providers to prevent worsening of shortness of breath and hospitalizations. . Current regimen:   Albuterol 0.083% nebulizer solution- use 1 vial by nebulization every four hours as needed for wheezing or shortness of breath  Albuterol (Ventolin HFA) inhaler, inhale 2 puffs every four hours as needed for wheezing  or shortness of breath  Budesonide/ formoterol (Symbicort) 106/4.549m/ act inhaler, inhale 2 puffs twice daily . Interventions: o We discussed proper inhaler technique and rinsing mouth and spitting after each use of Symbicort.  . Patient self care activities o Patient will continue current medications.    Medication management . Pharmacist Clinical Goal(s): o Over the next 180 days, patient will work with PharmD and providers to achieve optimal medication adherence . Current pharmacy: [facility]  . Interventions o Comprehensive medication review performed. o Continue current medication management strategy . Patient self care activities - Over the next 180 days, patient will: o Take medications as prescribed o Report any questions or concerns to PharmD and/or provider(s)  Please see past updates related to this goal by clicking on the "Past Updates" button in the selected goal        Depression Screen PHQ 2/9 Scores 05/11/2020 02/06/2018 05/25/2014  PHQ - 2 Score 0 0 0  PHQ- 9 Score 0 - -    Fall Risk Fall Risk  05/11/2020 06/08/2019 05/12/2019 10/28/2018 03/20/2018  Falls in the past year? 0 _0 0  Comment - - - - Emmi Telephone Survey: data to providers prior to load  Number falls in past yr: 0 1 0 1 -  Injury with Fall? 0 0 1 0 -  Risk for fall due to : Impaired balance/gait;Impaired mobility;Medication side effect - History of fall(s);Impaired balance/gait;Impaired mobility;Other (Comment) Impaired balance/gait;Impaired mobility -  Risk for fall due to: Comment - - diarrhea - -  Follow up Falls evaluation completed;Falls prevention discussed - Falls evaluation completed - -    FALL RISK PREVENTION PERTAINING TO THE HOME:  Any stairs in or around the home? Yes  If so, are there any without handrails? No  Home free of loose throw rugs in walkways, pet beds, electrical cords, etc? Yes  Adequate lighting in your home to reduce risk of falls? Yes   ASSISTIVE DEVICES  UTILIZED TO PREVENT FALLS:  Life alert? No  Use of a cane, walker or w/c? Yes  Grab bars in the bathroom? Yes  Shower chair or bench in shower? Yes  Elevated toilet seat or a handicapped toilet? No    Cognitive Function:     6CIT Screen 05/11/2020  What Year? 0 points  What month? 0 points  What time? 0 points  Count back from 20 0 points  Months in reverse 2 points  Repeat phrase 4 points  Total Score 6    Immunizations Immunization  History  Administered Date(s) Administered  . Influenza, High Dose Seasonal PF 02/17/2019  . PFIZER SARS-COV-2 Vaccination 03/17/2020  . Pneumococcal Conjugate-13 03/15/2015  . Pneumococcal Polysaccharide-23 05/21/2006, 02/23/2015, 05/21/2016, 08/14/2018, 02/17/2019, 03/17/2020    TDAP status: Due, Education has been provided regarding the importance of this vaccine. Advised may receive this vaccine at local pharmacy or Health Dept. Aware to provide a copy of the vaccination record if obtained from local pharmacy or Health Dept. Verbalized acceptance and understanding.  Flu Vaccine status: Due, Education has been provided regarding the importance of this vaccine. Advised may receive this vaccine at local pharmacy or Health Dept. Aware to provide a copy of the vaccination record if obtained from local pharmacy or Health Dept. Verbalized acceptance and understanding.  Pneumococcal vaccine status: Up to date  Covid-19 vaccine status: Completed vaccines  Qualifies for Shingles Vaccine? Yes   Zostavax completed No   Shingrix Completed?: No.    Education has been provided regarding the importance of this vaccine. Patient has been advised to call insurance company to determine out of pocket expense if they have not yet received this vaccine. Advised may also receive vaccine at local pharmacy or Health Dept. Verbalized acceptance and understanding.  Screening Tests Health Maintenance  Topic Date Due  . Hepatitis C Screening  Never done  .  TETANUS/TDAP  Never done  . FOOT EXAM  07/28/2019  . INFLUENZA VACCINE  11/29/2019  . HEMOGLOBIN A1C  03/10/2020  . COVID-19 Vaccine (2 - Pfizer 3-dose booster series) 04/07/2020  . OPHTHALMOLOGY EXAM  08/19/2020  . COLONOSCOPY (Pts 45-61yr Insurance coverage will need to be confirmed)  01/09/2021  . DEXA SCAN  Completed  . PNA vac Low Risk Adult  Completed    Health Maintenance  Health Maintenance Due  Topic Date Due  . Hepatitis C Screening  Never done  . TETANUS/TDAP  Never done  . FOOT EXAM  07/28/2019  . INFLUENZA VACCINE  11/29/2019  . HEMOGLOBIN A1C  03/10/2020  . COVID-19 Vaccine (2 - Pfizer 3-dose booster series) 04/07/2020    Colorectal cancer screening: Type of screening: Colonoscopy. Completed 01/09/2018. Repeat every 5 years  Mammogram status: Ordered 05/11/2020. Pt provided with contact info and advised to call to schedule appt.   Bone Density status: Completed 02/04/2020. Results reflect: Bone density results: NORMAL. Repeat every 0 years.  Lung Cancer Screening: (Low Dose CT Chest recommended if Age 78-80years, 30 pack-year currently smoking OR have quit w/in 15years.) does not qualify.   Lung Cancer Screening Referral: N/A   Additional Screening:  Hepatitis C Screening: does qualify;   Vision Screening: Recommended annual ophthalmology exams for early detection of glaucoma and other disorders of the eye. Is the patient up to date with their annual eye exam?  Yes  Who is the provider or what is the name of the office in which the patient attends annual eye exams? Dr.McFarland If pt is not established with a provider, would they like to be referred to a provider to establish care? No .   Dental Screening: Recommended annual dental exams for proper oral hygiene  Community Resource Referral / Chronic Care Management: CRR required this visit?  No   CCM required this visit?  No      Plan:     I have personally reviewed and noted the following in the  patient's chart:   . Medical and social history . Use of alcohol, tobacco or illicit drugs  . Current medications and supplements . Functional  ability and status . Nutritional status . Physical activity . Advanced directives . List of other physicians . Hospitalizations, surgeries, and ER visits in previous 12 months . Vitals . Screenings to include cognitive, depression, and falls . Referrals and appointments  In addition, I have reviewed and discussed with patient certain preventive protocols, quality metrics, and best practice recommendations. A written personalized care plan for preventive services as well as general preventive health recommendations were provided to patient.     Ofilia Neas, LPN   01/13/568   Nurse Notes: None

## 2020-05-12 ENCOUNTER — Other Ambulatory Visit: Payer: Self-pay | Admitting: Cardiology

## 2020-05-25 ENCOUNTER — Encounter: Payer: Self-pay | Admitting: Family Medicine

## 2020-05-25 ENCOUNTER — Telehealth (INDEPENDENT_AMBULATORY_CARE_PROVIDER_SITE_OTHER): Payer: PPO | Admitting: Family Medicine

## 2020-05-25 VITALS — Ht 60.0 in | Wt 195.0 lb

## 2020-05-25 DIAGNOSIS — J019 Acute sinusitis, unspecified: Secondary | ICD-10-CM

## 2020-05-25 MED ORDER — NEOMYCIN-POLYMYXIN-HC 3.5-10000-1 OT SOLN: 4.0000 [drp] | Freq: Four times a day (QID) | OTIC | 2 refills | Status: DC

## 2020-05-25 MED ORDER — LEVOFLOXACIN 500 MG PO TABS: 500.0000 mg | ORAL_TABLET | Freq: Every day | ORAL | 0 refills | Status: AC

## 2020-05-25 NOTE — Progress Notes (Signed)
Subjective:    Patient ID: [name], female    DOB: 11-08-1940, 80 y.o.   MRN: [id number]  HPI Virtual Visit via Telephone Note  I connected with the patient on 05/25/20 at  2:15 PM EST by telephone and verified that I am speaking with the correct person using two identifiers.   I discussed the limitations, risks, security and privacy concerns of performing an evaluation and management service by telephone and the availability of in person appointments. I also discussed with the patient that there may be a patient responsible charge related to this service. The patient expressed understanding and agreed to proceed.  Location patient: home Location provider: work or home office Participants present for the call: patient, provider Patient did not have a visit in the prior 7 days to address this/these issue(s).   History of Present Illness: Here for one week of sinus pressure, right ear pain, PND, and a dry cough. No fever or body aches. No SOB or NVD. Her son (who lives with her) recently tested positive for the Covid-19 virus, but she tested negative 2 days ago. She is drinking fluids.    Observations/Objective: Patient sounds cheerful and well on the phone. I do not appreciate any SOB. Speech and thought processing are grossly intact. Patient reported vitals:  Assessment and Plan: Sinusitis, treat with Levaquin for 10 days.  Alysia Penna, MD   Follow Up Instructions:     [phone] 5-10 [phone] 11-20 9443 21-30 I did not refer this patient for an OV in the next 24 hours for this/these issue(s).  I discussed the assessment and treatment plan with the patient. The patient was provided an opportunity to ask questions and all were answered. The patient agreed with the plan and demonstrated an understanding of the instructions.   The patient was advised to call back or seek an in-person evaluation if the symptoms worsen or if the condition fails to improve as anticipated.  I  provided 11 minutes of non-face-to-face time during this encounter.   Alysia Penna, MD   Review of Systems     Objective:   Physical Exam        Assessment & Plan:

## 2020-05-26 MED ORDER — HYDROCODONE-HOMATROPINE 5-1.5 MG/5ML PO SYRP
5.0000 mL | ORAL_SOLUTION | ORAL | 0 refills | Status: DC | PRN
Start: 2020-05-26 — End: 2020-08-29

## 2020-05-26 NOTE — Addendum Note (Signed)
Addended by: Alysia Penna A on: 05/26/2020 01:26 PM   Modules accepted: Orders

## 2020-05-30 ENCOUNTER — Other Ambulatory Visit: Payer: Self-pay

## 2020-05-30 ENCOUNTER — Telehealth (INDEPENDENT_AMBULATORY_CARE_PROVIDER_SITE_OTHER): Payer: PPO | Admitting: Family Medicine

## 2020-05-30 ENCOUNTER — Encounter: Payer: Self-pay | Admitting: Family Medicine

## 2020-05-30 ENCOUNTER — Ambulatory Visit: Payer: PPO | Admitting: Family Medicine

## 2020-05-30 DIAGNOSIS — M5441 Lumbago with sciatica, right side: Secondary | ICD-10-CM | POA: Diagnosis not present

## 2020-05-30 DIAGNOSIS — F1190 Opioid use, unspecified, uncomplicated: Secondary | ICD-10-CM | POA: Diagnosis not present

## 2020-05-30 DIAGNOSIS — M5442 Lumbago with sciatica, left side: Secondary | ICD-10-CM

## 2020-05-30 DIAGNOSIS — G8929 Other chronic pain: Secondary | ICD-10-CM | POA: Diagnosis not present

## 2020-05-30 MED ORDER — HYDROCODONE-ACETAMINOPHEN 10-325 MG PO TABS: 1.0000 | ORAL_TABLET | Freq: Four times a day (QID) | ORAL | 0 refills | Status: DC | PRN

## 2020-05-30 MED ORDER — HYDROCODONE-ACETAMINOPHEN 10-325 MG PO TABS: 1.0000 | ORAL_TABLET | Freq: Four times a day (QID) | ORAL | 0 refills | Status: AC | PRN

## 2020-05-30 NOTE — Progress Notes (Signed)
Subjective:    Patient ID: [name], female    DOB: 09/10/1940, 80 y.o.   MRN: [id number]  HPI Virtual Visit via Telephone Note  I connected with the patient on 05/30/20 at  4:15 PM EST by telephone and verified that I am speaking with the correct person using two identifiers.   I discussed the limitations, risks, security and privacy concerns of performing an evaluation and management service by telephone and the availability of in person appointments. I also discussed with the patient that there may be a patient responsible charge related to this service. The patient expressed understanding and agreed to proceed.  Location patient: home Location provider: work or home office Participants present for the call: patient, provider Patient did not have a visit in the prior 7 days to address this/these issue(s).   History of Present Illness: Here for pain management, she is doing well.  Indication for chronic opioid: low back pain Medication and dose: Norco 10-325 # pills per month: 120 Last UDS date: 01-13-20 Opioid Treatment Agreement signed (Y/N): 10-28-18 Opioid Treatment Agreement last reviewed with patient:  05-30-20 N[facility]SRS reviewed this encounter (include red flags): Yes    Observations/Objective: Patient sounds cheerful and well on the phone. I do not appreciate any SOB. Speech and thought processing are grossly intact. Patient reported vitals:  Assessment and Plan: Pain management, meds were refilled.  Alysia Penna, MD   Follow Up Instructions:     [phone] 5-10 [phone] 11-20 9443 21-30 I did not refer this patient for an OV in the next 24 hours for this/these issue(s).  I discussed the assessment and treatment plan with the patient. The patient was provided an opportunity to ask questions and all were answered. The patient agreed with the plan and demonstrated an understanding of the instructions.   The patient was advised to call back or seek an in-person  evaluation if the symptoms worsen or if the condition fails to improve as anticipated.  I provided 13 minutes of non-face-to-face time during this encounter.   Alysia Penna, MD    Review of Systems     Objective:   Physical Exam        Assessment & Plan:

## 2020-05-31 DIAGNOSIS — M25512 Pain in left shoulder: Secondary | ICD-10-CM | POA: Diagnosis not present

## 2020-05-31 DIAGNOSIS — M1712 Unilateral primary osteoarthritis, left knee: Secondary | ICD-10-CM | POA: Diagnosis not present

## 2020-06-08 ENCOUNTER — Other Ambulatory Visit: Payer: Self-pay | Admitting: Cardiology

## 2020-06-27 ENCOUNTER — Other Ambulatory Visit: Payer: Self-pay | Admitting: Family Medicine

## 2020-06-27 NOTE — Telephone Encounter (Signed)
Refill request for:   Tizanadine 4 mg LR 04/04/20, _0 , 0 rf LOV 05/30/20 FOV  None scheduled.   Please review and advise.  Thanks.  Dm/cma

## 2020-06-28 DIAGNOSIS — S46012A Strain of muscle(s) and tendon(s) of the rotator cuff of left shoulder, initial encounter: Secondary | ICD-10-CM | POA: Diagnosis not present

## 2020-06-28 DIAGNOSIS — M47812 Spondylosis without myelopathy or radiculopathy, cervical region: Secondary | ICD-10-CM | POA: Diagnosis not present

## 2020-06-29 ENCOUNTER — Telehealth: Payer: Self-pay | Admitting: Pharmacist

## 2020-06-29 NOTE — Chronic Care Management (AMB) (Signed)
Chronic Care Management Pharmacy Assistant   Name: [name]  MRN: [id number] DOB: 1940/05/16  Reason for Encounter: General Adherence Call   Patient Questions: 1.  Have you seen any other providers since your last visit?  Consult notes o 11.12.21 Jerline Pain, MD Cardiology o 11.02.21 Ladene Artist, MD Gastroenterology o 08.24.21 Allyn Kenner Dermatology  Office visit o 01.31.22 [name], MD Family Medicine o 01.26.22 Video [name], MD Family Medicine o 09.16.21 [name], MD Family Medicine  o 09.01.21 Eulas Post, MD Family Medicine  2.  Any changes in your medicines or health?     PCP : [name], MD Allergies:   Allergies  Allergen Reactions  . Doxycycline Other (See Comments)    Unsteady gait  . Aspirin Nausea And Vomiting  . Azithromycin Other (See Comments)    REACTION: pt states "ZPak doesn't work"  . Caffeine Nausea And Vomiting  . Ciprofloxacin Nausea And Vomiting    Tolerates levaquin  . Codeine Nausea And Vomiting  . Oxycodone     Pt stated, "upsets my stomach" (11/19/17)  . Sulfamethizole Other (See Comments)    Unknown reaction  . Tramadol Hcl Other (See Comments)    REACTION: pt states "spaced-out"    Medications: Outpatient Encounter Medications as of 06/29/2020  Medication Sig  . ACCU-CHEK FASTCLIX LANCETS MISC Test once per day and diagnosis code is E 11.9  . acetaminophen (TYLENOL) 500 MG tablet Take 500 mg by mouth every 6 (six) hours as needed for headache (pain).   Marland Kitchen albuterol (PROVENTIL) (2.5 MG/3ML) 0.083% nebulizer solution Take 3 mLs (2.5 mg total) by nebulization every 4 (four) hours as needed for wheezing or shortness of breath.  Marland Kitchen albuterol (VENTOLIN HFA) 108 (90 BASE) MCG/ACT inhaler Inhale 2 puffs into the lungs every 4 (four) hours as needed for wheezing or shortness of breath.  . ALPRAZolam (XANAX) 0.5 MG tablet Take 1 tablet (0.5 mg total) by mouth 3 (three) times daily as needed.  Marland Kitchen  amLODipine (NORVASC) 10 MG tablet TAKE 1 TABLET BY MOUTH EVERY DAY  . atenolol (TENORMIN) 50 MG tablet TAKE 1 TABLET BY MOUTH EVERY DAY  . atorvastatin (LIPITOR) 20 MG tablet Take 1 tablet (20 mg total) by mouth daily.  Marland Kitchen azelastine (ASTELIN) 0.1 % nasal spray Place 1 spray into both nostrils at bedtime.  . budesonide-formoterol (SYMBICORT) 160-4.5 MCG/ACT inhaler Inhale 2 puffs into the lungs 2 (two) times daily.  . Cholecalciferol (VITAMIN D-3) 5000 UNITS TABS Take 5,000 Units by mouth daily.  . clopidogrel (PLAVIX) 75 MG tablet TAKE 1 TABLET BY MOUTH EVERY DAY  . clotrimazole (LOTRIMIN) 1 % external solution Apply 1 application topically 2 (two) times daily. In between toes  . diclofenac sodium (VOLTAREN) 1 % GEL As needed for pain  . diclofenac Sodium (VOLTAREN) 1 % GEL   . diphenoxylate-atropine (LOMOTIL) 2.5-0.025 MG tablet TAKE 1 TABLET BY MOUTH THREE TIMES A DAY AS NEEDED FOR DIARRHEA OR LOOSE STOOLS  . Ferrous Sulfate (IRON) 325 (65 Fe) MG TABS Take 1 tablet (325 mg total) by mouth 2 (two) times daily.  . fluticasone (FLONASE) 50 MCG/ACT nasal spray PLACE 2 SPRAYS IN EACH NOSTRIL AT BEDTIME  . furosemide (LASIX) 40 MG tablet TAKE 1 TABLET (40 MG TOTAL) BY MOUTH 2 (TWO) TIMES DAILY.  Marland Kitchen gabapentin (NEURONTIN) 600 MG tablet Take 1 tablet (600 mg total) by mouth 3 (three) times daily.  Marland Kitchen glimepiride (AMARYL) 1 MG tablet  TAKE 1 TABLET BY MOUTH EVERY DAY BEFORE BREAKFAST  . glucose blood (ACCU-CHEK AVIVA PLUS) test strip TEST ONCE PER DAY AND DIAGNOSIS CODE IS E 11.9  . glycopyrrolate (ROBINUL) 1 MG tablet Take 1 tablet (1 mg total) by mouth 2 (two) times daily.  Derrill Memo ON 07/25/2020] HYDROcodone-acetaminophen (NORCO) 10-325 MG tablet Take 1 tablet by mouth every 6 (six) hours as needed for moderate pain.  Marland Kitchen HYDROcodone-homatropine (HYCODAN) 5-1.5 MG/5ML syrup Take 5 mLs by mouth every 4 (four) hours as needed for cough.  . hydrOXYzine (ATARAX/VISTARIL) 25 MG tablet TAKE 1 TABLET EVERY 4  HOURS AS NEEDED FOR ITCHING  . hydrOXYzine (VISTARIL) 25 MG capsule TAKE 1 CAPSULE (25 MG TOTAL) BY MOUTH EVERY 6 (SIX) HOURS AS NEEDED.  Marland Kitchen isosorbide mononitrate (IMDUR) 60 MG 24 hr tablet TAKE 1 TABLET BY MOUTH EVERY DAY  . ketoconazole (NIZORAL) 2 % cream Apply 1 application topically daily.  Marland Kitchen levocetirizine (XYZAL) 5 MG tablet TAKE 1 TABLET BY MOUTH EVERY DAY IN THE EVENING  . LIDODERM 5 % APPLY 1 PATCH TO SKIN FOR 12 HOURS THEN REMOVE AND DISCARD PATCH WITHIN 72 HOURS OR AS DIRECTED  . loperamide (IMODIUM) 2 MG capsule Take 4-6 mg by mouth daily.  Marland Kitchen losartan (COZAAR) 50 MG tablet TAKE 1 TABLET BY MOUTH EVERY DAY  . magnesium oxide (MAG-OX) 400 MG tablet TAKE 1 TABLET BY MOUTH EVERY DAY  . mesalamine (LIALDA) 1.2 g EC tablet Take 2 tablets (2.4 g total) by mouth 2 (two) times daily.  . metFORMIN (GLUCOPHAGE) 1000 MG tablet TAKE 1 TABLET (1,000 MG TOTAL) BY MOUTH 2 (TWO) TIMES DAILY WITH A MEAL.  . methylPREDNISolone acetate (DEPO-MEDROL) 40 MG/ML injection SMARTSIG:2 Milliliter(s) IM Once PRN  . mirabegron ER (MYRBETRIQ) 25 MG TB24 tablet Take 1 tablet (25 mg total) by mouth daily.  . montelukast (SINGULAIR) 10 MG tablet Take one every morning  . nabumetone (RELAFEN) 750 MG tablet TAKE 1 TABLET BY MOUTH TWICE A DAY WITH MEAL  . neomycin-polymyxin-hydrocortisone (CORTISPORIN) otic solution Use 1 drop in affected ear three times daily as needed  . neomycin-polymyxin-hydrocortisone (CORTISPORIN) OTIC solution Place 4 drops into both ears 4 (four) times daily.  . nitroGLYCERIN (NITROSTAT) 0.4 MG SL tablet PLACE 1 TABLET (0.4 MG TOTAL) UNDER THE TONGUE EVERY 5 (FIVE) MINUTES AS NEEDED FOR CHEST PAIN.  Marland Kitchen Olopatadine HCl 0.7 % SOLN Place 1 drop into both eyes daily as needed (itching/allergies).  . ondansetron (ZOFRAN) 4 MG tablet Take 1 tablet (4 mg total) by mouth every 6 (six) hours as needed for nausea or vomiting.  . ondansetron (ZOFRAN-ODT) 4 MG disintegrating tablet TAKE 1 TABLET BY MOUTH  EVERY 8 HOURS AS NEEDED FOR NAUSEA AND VOMITING  . Pancrelipase, Lip-Prot-Amyl, (ZENPEP) 20000-63000 units CPEP TAKE 2 CAPSULES (40,000 UNITS) WITH MEALS AND 1 CAPSULE (20,000 UNITS) WITH A SNACK  . pantoprazole (PROTONIX) 40 MG tablet TAKE 1 TABLET BY MOUTH TWICE A DAY  . Polyethyl Glycol-Propyl Glycol (SYSTANE OP) Place 1 drop into both eyes daily. For dry eyes  . potassium chloride SA (KLOR-CON M20) 20 MEQ tablet Take 1 tablet (20 mEq total) by mouth 3 (three) times daily.  Marland Kitchen tiZANidine (ZANAFLEX) 4 MG tablet TAKE 1 TABLET BY MOUTH TWICE A DAY   Facility-Administered Encounter Medications as of 06/29/2020  Medication  . ipratropium-albuterol (DUONEB) 0.5-2.5 (3) MG/3ML nebulizer solution 3 mL    Current Diagnosis: Patient Active Problem List   Diagnosis Date Noted  . H. pylori infection 05/12/2019  .  Low back pain 10/21/2018  . Fibromyalgia 01/03/2017  . Left sided colitis ([place]) 05/13/2015  . Diarrhea 08/04/2013  . Back pain, chronic 02/06/2012  . Hyperlipidemia 12/07/2011  . Hematuria 11/27/2011  . Ulcerative colitis ([place]) 11/25/2011  . Chronic diastolic CHF (congestive heart failure) ([place]) 11/25/2011  . Lower extremity edema 11/25/2011  . Nonischemic cardiomyopathy ([facility]) 04/17/2011  . CAD (coronary artery disease)   . Bronchiectasis ([place])   . Essential hypertension   . VITAMIN D DEFICIENCY 12/03/2009  . Depression 08/10/2008  . PULMONARY NODULE 07/15/2008  . NEOPLASM, KIDNEY 03/16/2008  . STRESS INCONTINENCE 03/16/2008  . COLONIC POLYPS 07/19/2007  . Diabetes mellitus without complication ([place]) 95/32/0233  . OBESITY 07/19/2007  . ANEMIA 07/19/2007  . Asthma 07/19/2007  . DIVERTICULOSIS OF COLON 07/19/2007  . ANXIETY 03/19/2007  . GERD 03/19/2007  . Osteoarthritis 03/19/2007  . FIBROMYALGIA 03/19/2007  . Allergic state 03/19/2007    Goals Addressed   None    Follow-Up:  Pharmacist Review  I spoke with the patient and discussed medication adherence, no issues at  this time with current medication. The patient states that she has been doing well. She declined a follow-up  CCM appointment and would like to disenroll from the program. She states if she has any issues or concerns, she will bring them up with her primary care doctor. She has continued to take her medication as written. She denies ED visits since his last CPP follow-up. She also denies any side effects with his medication and any problems with his current pharmacy.  Maia Breslow, [place] Assistant [phone]

## 2020-07-07 ENCOUNTER — Other Ambulatory Visit: Payer: Self-pay | Admitting: Family Medicine

## 2020-07-07 DIAGNOSIS — M25512 Pain in left shoulder: Secondary | ICD-10-CM | POA: Diagnosis not present

## 2020-07-11 ENCOUNTER — Other Ambulatory Visit: Payer: Self-pay

## 2020-07-11 MED ORDER — NABUMETONE 750 MG PO TABS: ORAL_TABLET | ORAL | 0 refills | Status: DC

## 2020-07-12 DIAGNOSIS — M19012 Primary osteoarthritis, left shoulder: Secondary | ICD-10-CM | POA: Diagnosis not present

## 2020-07-12 DIAGNOSIS — M25512 Pain in left shoulder: Secondary | ICD-10-CM | POA: Diagnosis not present

## 2020-07-12 DIAGNOSIS — R5383 Other fatigue: Secondary | ICD-10-CM | POA: Diagnosis not present

## 2020-07-12 DIAGNOSIS — M2550 Pain in unspecified joint: Secondary | ICD-10-CM | POA: Diagnosis not present

## 2020-07-12 DIAGNOSIS — E559 Vitamin D deficiency, unspecified: Secondary | ICD-10-CM | POA: Diagnosis not present

## 2020-07-13 ENCOUNTER — Telehealth: Payer: Self-pay | Admitting: Family Medicine

## 2020-07-13 DIAGNOSIS — E119 Type 2 diabetes mellitus without complications: Secondary | ICD-10-CM

## 2020-07-13 NOTE — Telephone Encounter (Signed)
Patient is calling and stated that she has been experiencing leg cramps while resting and wanting to know if she should get her potassium increased, please advise. CB is [phone]

## 2020-07-14 MED ORDER — ONETOUCH DELICA LANCETS 33G MISC: 1.0000 | Freq: Every day | 0 refills | Status: AC

## 2020-07-14 MED ORDER — ONETOUCH VERIO FLEX SYSTEM W/DEVICE KIT: 1.0000 | PACK | Freq: Every day | 0 refills | Status: DC

## 2020-07-14 MED ORDER — ONETOUCH VERIO VI STRP: ORAL_STRIP | 0 refills | Status: DC

## 2020-07-14 NOTE — Telephone Encounter (Signed)
Spoke with pt advised to schedule an office visit with Dr Sarajane Jews, pt scheduled for 07/18/2020 at 11 am. Pt request for a glucose meter, lancets and test strip was sent to pt pharmacy

## 2020-07-15 ENCOUNTER — Other Ambulatory Visit: Payer: Self-pay

## 2020-07-18 ENCOUNTER — Ambulatory Visit (INDEPENDENT_AMBULATORY_CARE_PROVIDER_SITE_OTHER): Payer: PPO | Admitting: Family Medicine

## 2020-07-18 ENCOUNTER — Other Ambulatory Visit: Payer: Self-pay | Admitting: Family Medicine

## 2020-07-18 ENCOUNTER — Other Ambulatory Visit: Payer: Self-pay

## 2020-07-18 ENCOUNTER — Encounter: Payer: Self-pay | Admitting: Family Medicine

## 2020-07-18 VITALS — BP 120/68 | HR 78 | Temp 98.3°F | Wt 212.8 lb

## 2020-07-18 DIAGNOSIS — I428 Other cardiomyopathies: Secondary | ICD-10-CM

## 2020-07-18 DIAGNOSIS — E559 Vitamin D deficiency, unspecified: Secondary | ICD-10-CM

## 2020-07-18 DIAGNOSIS — E039 Hypothyroidism, unspecified: Secondary | ICD-10-CM

## 2020-07-18 DIAGNOSIS — E119 Type 2 diabetes mellitus without complications: Secondary | ICD-10-CM | POA: Diagnosis not present

## 2020-07-18 DIAGNOSIS — R252 Cramp and spasm: Secondary | ICD-10-CM | POA: Insufficient documentation

## 2020-07-18 DIAGNOSIS — I10 Essential (primary) hypertension: Secondary | ICD-10-CM | POA: Diagnosis not present

## 2020-07-18 LAB — HEPATIC FUNCTION PANEL
ALT: 7 U/L (ref 0–35)
AST: 11 U/L (ref 0–37)
Albumin: 4.4 g/dL (ref 3.5–5.2)
Alkaline Phosphatase: 55 U/L (ref 39–117)
Bilirubin, Direct: 0.1 mg/dL (ref 0.0–0.3)
Total Bilirubin: 0.4 mg/dL (ref 0.2–1.2)
Total Protein: 7.2 g/dL (ref 6.0–8.3)

## 2020-07-18 LAB — LIPID PANEL
Cholesterol: 159 mg/dL (ref 0–200)
HDL: 66.1 mg/dL (ref >39.00)
LDL Cholesterol: 78 mg/dL (ref 0–99)
NonHDL: 92.41
Total CHOL/HDL Ratio: 2
Triglycerides: 71 mg/dL (ref 0.0–149.0)
VLDL: 14.2 mg/dL (ref 0.0–40.0)

## 2020-07-18 LAB — CBC WITH DIFFERENTIAL/PLATELET
Basophils Absolute: 0 10*3/uL (ref 0.0–0.1)
Basophils Relative: 0.3 % (ref 0.0–3.0)
Eosinophils Absolute: 0.3 10*3/uL (ref 0.0–0.7)
Eosinophils Relative: 2 % (ref 0.0–5.0)
HCT: 36.4 % (ref 36.0–46.0)
Hemoglobin: 12 g/dL (ref 12.0–15.0)
Lymphocytes Relative: 14.2 % (ref 12.0–46.0)
Lymphs Abs: 1.9 10*3/uL (ref 0.7–4.0)
MCHC: 32.8 g/dL (ref 30.0–36.0)
MCV: 92.2 fl (ref 78.0–100.0)
Monocytes Absolute: 0.9 10*3/uL (ref 0.1–1.0)
Monocytes Relative: 6.6 % (ref 3.0–12.0)
Neutro Abs: 10.2 10*3/uL — ABNORMAL HIGH (ref 1.4–7.7)
Neutrophils Relative %: 76.9 % (ref 43.0–77.0)
Platelets: 466 10*3/uL — ABNORMAL HIGH (ref 150.0–400.0)
RBC: 3.95 Mil/uL (ref 3.87–5.11)
RDW: 14.3 % (ref 11.5–15.5)
WBC: 13.2 10*3/uL — ABNORMAL HIGH (ref 4.0–10.5)

## 2020-07-18 LAB — BASIC METABOLIC PANEL
BUN: 18 mg/dL (ref 6–23)
CO2: 30 mEq/L (ref 19–32)
Calcium: 9.4 mg/dL (ref 8.4–10.5)
Chloride: 101 mEq/L (ref 96–112)
Creatinine, Ser: 0.6 mg/dL (ref 0.40–1.20)
GFR: 86.24 mL/min (ref >60.00)
Glucose, Bld: 152 mg/dL — ABNORMAL HIGH (ref 70–99)
Potassium: 4.5 mEq/L (ref 3.5–5.1)
Sodium: 139 mEq/L (ref 135–145)

## 2020-07-18 LAB — T4, FREE: Free T4: 0.86 ng/dL (ref 0.60–1.60)

## 2020-07-18 LAB — HEMOGLOBIN A1C: Hgb A1c MFr Bld: 7.7 % — ABNORMAL HIGH (ref 4.6–6.5)

## 2020-07-18 LAB — T3, FREE: T3, Free: 3.2 pg/mL (ref 2.3–4.2)

## 2020-07-18 LAB — MAGNESIUM: Magnesium: 1.8 mg/dL (ref 1.5–2.5)

## 2020-07-18 LAB — TSH: TSH: 2.45 u[IU]/mL (ref 0.35–4.50)

## 2020-07-18 MED ORDER — HYDROXYZINE PAMOATE 25 MG PO CAPS: ORAL_CAPSULE | ORAL | 3 refills | Status: DC

## 2020-07-18 MED ORDER — FUROSEMIDE 40 MG PO TABS: 40.0000 mg | ORAL_TABLET | Freq: Two times a day (BID) | ORAL | 3 refills | Status: DC

## 2020-07-18 MED ORDER — MAGNESIUM OXIDE 400 MG PO TABS: 2.0000 | ORAL_TABLET | Freq: Every day | ORAL | 3 refills | Status: DC

## 2020-07-18 NOTE — Progress Notes (Signed)
   Subjective:    Patient ID: [name], female    DOB: October 13, 1940, 80 y.o.   MRN: [id number]  HPI Here to follow up on diabetes and HTN and leg cramps. Despite taking a magnesium tablet at bedtime, she still often gets up with cramps during the night. She drinks lots of fluids and she eats bananas. Her GERD is stable. Her leg swelling is fairly well controlled with Lasix.   Review of Systems  Constitutional: Negative.   Respiratory: Negative.   Cardiovascular: Positive for leg swelling. Negative for chest pain and palpitations.  Musculoskeletal: Positive for myalgias.       Objective:   Physical Exam Constitutional:      Appearance: Normal appearance.     Comments: She uses a walker   Cardiovascular:     Rate and Rhythm: Normal rate and regular rhythm.     Pulses: Normal pulses.     Heart sounds: Normal heart sounds.  Pulmonary:     Effort: Pulmonary effort is normal.     Breath sounds: Normal breath sounds.  Musculoskeletal:     Comments: 2+ edema in both ankles   Neurological:     Mental Status: She is alert.           Assessment & Plan:  Her GERD and HTN and leg swelling are stable. Get fasting labs to check lipids, an A1c, and levels for potassium and magnesium. She will increase the magnesium to 2 tablets (800 mg) at bedtime.  Alysia Penna, MD

## 2020-07-18 NOTE — Telephone Encounter (Signed)
Hydroxyzine 25 mg refill has been sent to pharmacy today   Last refill- 10/27/2019--90 tabs with 5 refills  Seen today in office.

## 2020-07-20 ENCOUNTER — Encounter: Payer: Self-pay | Admitting: Family Medicine

## 2020-07-20 ENCOUNTER — Other Ambulatory Visit: Payer: Self-pay | Admitting: Family Medicine

## 2020-07-20 NOTE — Telephone Encounter (Signed)
Last filled 10/27/2019 Last OV 07/18/2020  Ok to fill?

## 2020-07-21 ENCOUNTER — Telehealth: Payer: Self-pay

## 2020-07-21 NOTE — Telephone Encounter (Signed)
Reviewed lab results with pt verbalized understanding

## 2020-07-26 ENCOUNTER — Telehealth: Payer: Self-pay

## 2020-07-26 NOTE — Telephone Encounter (Signed)
Reviewed lab results with pt verbalized understanding 

## 2020-08-02 DIAGNOSIS — M791 Myalgia, unspecified site: Secondary | ICD-10-CM | POA: Diagnosis not present

## 2020-08-02 DIAGNOSIS — M5030 Other cervical disc degeneration, unspecified cervical region: Secondary | ICD-10-CM | POA: Diagnosis not present

## 2020-08-02 DIAGNOSIS — G894 Chronic pain syndrome: Secondary | ICD-10-CM | POA: Diagnosis not present

## 2020-08-02 DIAGNOSIS — Z6838 Body mass index (BMI) 38.0-38.9, adult: Secondary | ICD-10-CM | POA: Diagnosis not present

## 2020-08-02 DIAGNOSIS — E669 Obesity, unspecified: Secondary | ICD-10-CM | POA: Diagnosis not present

## 2020-08-02 DIAGNOSIS — M797 Fibromyalgia: Secondary | ICD-10-CM | POA: Diagnosis not present

## 2020-08-02 DIAGNOSIS — R768 Other specified abnormal immunological findings in serum: Secondary | ICD-10-CM | POA: Diagnosis not present

## 2020-08-04 ENCOUNTER — Other Ambulatory Visit: Payer: Self-pay | Admitting: Family Medicine

## 2020-08-09 DIAGNOSIS — M19012 Primary osteoarthritis, left shoulder: Secondary | ICD-10-CM | POA: Diagnosis not present

## 2020-08-11 ENCOUNTER — Other Ambulatory Visit: Payer: Self-pay | Admitting: Gastroenterology

## 2020-08-29 ENCOUNTER — Encounter: Payer: Self-pay | Admitting: Family Medicine

## 2020-08-29 ENCOUNTER — Ambulatory Visit (INDEPENDENT_AMBULATORY_CARE_PROVIDER_SITE_OTHER): Payer: PPO | Admitting: Family Medicine

## 2020-08-29 DIAGNOSIS — M5442 Lumbago with sciatica, left side: Secondary | ICD-10-CM | POA: Diagnosis not present

## 2020-08-29 DIAGNOSIS — G8929 Other chronic pain: Secondary | ICD-10-CM

## 2020-08-29 DIAGNOSIS — M5441 Lumbago with sciatica, right side: Secondary | ICD-10-CM | POA: Diagnosis not present

## 2020-08-29 DIAGNOSIS — F1190 Opioid use, unspecified, uncomplicated: Secondary | ICD-10-CM | POA: Diagnosis not present

## 2020-08-29 MED ORDER — HYDROCODONE-ACETAMINOPHEN 10-325 MG PO TABS: 1.0000 | ORAL_TABLET | Freq: Four times a day (QID) | ORAL | 0 refills | Status: DC | PRN

## 2020-08-29 NOTE — Progress Notes (Signed)
   Subjective:    Patient ID: [name], female    DOB: 05-19-1940, 80 y.o.   MRN: [id number]  HPI Virtual Visit via Telephone Note  I connected with the patient on 08/29/20 at 10:15 AM EDT by telephone and verified that I am speaking with the correct person using two identifiers.   I discussed the limitations, risks, security and privacy concerns of performing an evaluation and management service by telephone and the availability of in person appointments. I also discussed with the patient that there may be a patient responsible charge related to this service. The patient expressed understanding and agreed to proceed.  Location patient: home Location provider: work or home office Participants present for the call: patient, provider Patient did not have a visit in the prior 7 days to address this/these issue(s).   History of Present Illness: Here for pain management, she is doing well.    Observations/Objective: Patient sounds cheerful and well on the phone. I do not appreciate any SOB. Speech and thought processing are grossly intact. Patient reported vitals:  Assessment and Plan: Pain management.  Indication for chronic opioid: low back pain Medication and dose: Norco 10-325 # pills per month: 120 Last UDS date: 01-13-20 Opioid Treatment Agreement signed (Y/N): 10-28-18 Opioid Treatment Agreement last reviewed with patient:  08-29-20 N[facility]SRS reviewed this encounter (include red flags): Yes Meds were refilled.  Alysia Penna, MD   Follow Up Instructions:     [phone] 5-10 [phone] 11-20 9443 21-30 I did not refer this patient for an OV in the next 24 hours for this/these issue(s).  I discussed the assessment and treatment plan with the patient. The patient was provided an opportunity to ask questions and all were answered. The patient agreed with the plan and demonstrated an understanding of the instructions.   The patient was advised to call back or seek an in-person  evaluation if the symptoms worsen or if the condition fails to improve as anticipated.  I provided 13 minutes of non-face-to-face time during this encounter.   Alysia Penna, MD    Review of Systems     Objective:   Physical Exam        Assessment & Plan:

## 2020-09-07 ENCOUNTER — Other Ambulatory Visit: Payer: Self-pay

## 2020-09-07 MED ORDER — ATORVASTATIN CALCIUM 20 MG PO TABS: 20.0000 mg | ORAL_TABLET | Freq: Every day | ORAL | 1 refills | Status: DC

## 2020-09-14 ENCOUNTER — Telehealth: Payer: Self-pay | Admitting: Pharmacist

## 2020-09-14 NOTE — Chronic Care Management (AMB) (Addendum)
Chronic Care Management Pharmacy Assistant   Name: [name]  MRN: [id number] DOB: 1940-06-10  Reason for Encounter: General Adherence Call   Recent office visits:  . 05.02.2022 [name], MD (PCP) Video visit. Patient was seen for fall. o Medication change- Hydrocodone-acetaminophen ([place]) increased from 5-1.5 mg to 10-35 mg I want every six hours as needed . 03.21.2022 [name], MD (PCP) patient seen for follow up on diabetes and hypertension o Medication changes o Magnesium Oxide 800 mg Oral Daily  o Hydroxyzine HCl 25 mg take one tablet every four hours as needed for inching-discontinued  Recent consult visits:  . 03.015.2022 [facility] [place] follow up on left shoulder pain and steroid injection . 03.10.2022 [facility] follow up for left shoulder pain.  Hospital visits:  None in previous 6 months  Medications: Outpatient Encounter Medications as of 09/14/2020  Medication Sig  . acetaminophen (TYLENOL) 500 MG tablet Take 500 mg by mouth every 6 (six) hours as needed for headache (pain).   Marland Kitchen albuterol (PROVENTIL) (2.5 MG/3ML) 0.083% nebulizer solution Take 3 mLs (2.5 mg total) by nebulization every 4 (four) hours as needed for wheezing or shortness of breath.  Marland Kitchen albuterol (VENTOLIN HFA) 108 (90 BASE) MCG/ACT inhaler Inhale 2 puffs into the lungs every 4 (four) hours as needed for wheezing or shortness of breath.  . ALPRAZolam (XANAX) 0.5 MG tablet TAKE 1 TABLET (0.5 MG TOTAL) BY MOUTH 3 (THREE) TIMES DAILY AS NEEDED.  Marland Kitchen amLODipine (NORVASC) 10 MG tablet TAKE 1 TABLET BY MOUTH EVERY DAY  . atenolol (TENORMIN) 50 MG tablet TAKE 1 TABLET BY MOUTH EVERY DAY  . atorvastatin (LIPITOR) 20 MG tablet Take 1 tablet (20 mg total) by mouth daily.  Marland Kitchen azelastine (ASTELIN) 0.1 % nasal spray Place 1 spray into both nostrils at bedtime.  . Blood Glucose Monitoring Suppl (ONETOUCH VERIO FLEX SYSTEM) w/Device KIT 1 each by Does not apply route  daily at 12 noon.  . budesonide-formoterol (SYMBICORT) 160-4.5 MCG/ACT inhaler Inhale 2 puffs into the lungs 2 (two) times daily.  . Cholecalciferol (VITAMIN D-3) 5000 UNITS TABS Take 5,000 Units by mouth daily.  . clopidogrel (PLAVIX) 75 MG tablet TAKE 1 TABLET BY MOUTH EVERY DAY  . clotrimazole (LOTRIMIN) 1 % external solution Apply 1 application topically 2 (two) times daily. In between toes  . diclofenac sodium (VOLTAREN) 1 % GEL As needed for pain  . diclofenac Sodium (VOLTAREN) 1 % GEL   . diphenoxylate-atropine (LOMOTIL) 2.5-0.025 MG tablet TAKE 1 TABLET BY MOUTH 3 TIMES A DAY AS NEEDED FOR DIARRHEA OR LOOSE STOOLS.  Marland Kitchen Ferrous Sulfate (IRON) 325 (65 Fe) MG TABS Take 1 tablet (325 mg total) by mouth 2 (two) times daily.  . fluticasone (FLONASE) 50 MCG/ACT nasal spray PLACE 2 SPRAYS IN EACH NOSTRIL AT BEDTIME  . furosemide (LASIX) 40 MG tablet Take 1 tablet (40 mg total) by mouth 2 (two) times daily.  Marland Kitchen gabapentin (NEURONTIN) 600 MG tablet Take 1 tablet (600 mg total) by mouth 3 (three) times daily.  Marland Kitchen glimepiride (AMARYL) 1 MG tablet TAKE 1 TABLET BY MOUTH EVERY DAY BEFORE BREAKFAST  . glucose blood (ONETOUCH VERIO) test strip Use to check blood glucose once daily  . glycopyrrolate (ROBINUL) 1 MG tablet Take 1 tablet (1 mg total) by mouth 2 (two) times daily.  Derrill Memo ON 10/29/2020] HYDROcodone-acetaminophen (NORCO) 10-325 MG tablet Take 1 tablet by mouth every 6 (six) hours as needed for moderate pain.  Marland Kitchen  hydrOXYzine (VISTARIL) 25 MG capsule TAKE 1 CAPSULE (25 MG TOTAL) BY MOUTH EVERY 6 (SIX) HOURS AS NEEDED.  Marland Kitchen isosorbide mononitrate (IMDUR) 60 MG 24 hr tablet TAKE 1 TABLET BY MOUTH EVERY DAY  . ketoconazole (NIZORAL) 2 % cream Apply 1 application topically daily.  Marland Kitchen levocetirizine (XYZAL) 5 MG tablet TAKE 1 TABLET BY MOUTH EVERY DAY IN THE EVENING  . LIDODERM 5 % APPLY 1 PATCH TO SKIN FOR 12 HOURS THEN REMOVE AND DISCARD PATCH WITHIN 72 HOURS OR AS DIRECTED  . loperamide (IMODIUM) 2 MG  capsule Take 4-6 mg by mouth daily.  Marland Kitchen losartan (COZAAR) 50 MG tablet TAKE 1 TABLET BY MOUTH EVERY DAY  . magnesium oxide (MAG-OX) 400 MG tablet Take 2 tablets (800 mg total) by mouth daily.  . mesalamine (LIALDA) 1.2 g EC tablet Take 2 tablets (2.4 g total) by mouth 2 (two) times daily.  . metFORMIN (GLUCOPHAGE) 1000 MG tablet TAKE 1 TABLET (1,000 MG TOTAL) BY MOUTH 2 (TWO) TIMES DAILY WITH A MEAL.  . methylPREDNISolone acetate (DEPO-MEDROL) 40 MG/ML injection SMARTSIG:2 Milliliter(s) IM Once PRN  . mirabegron ER (MYRBETRIQ) 25 MG TB24 tablet Take 1 tablet (25 mg total) by mouth daily.  . montelukast (SINGULAIR) 10 MG tablet Take one every morning  . nabumetone (RELAFEN) 750 MG tablet TAKE 1 TABLET BY MOUTH TWICE A DAY WITH MEAL  . neomycin-polymyxin-hydrocortisone (CORTISPORIN) otic solution Use 1 drop in affected ear three times daily as needed  . neomycin-polymyxin-hydrocortisone (CORTISPORIN) OTIC solution Place 4 drops into both ears 4 (four) times daily.  . nitroGLYCERIN (NITROSTAT) 0.4 MG SL tablet PLACE 1 TABLET (0.4 MG TOTAL) UNDER THE TONGUE EVERY 5 (FIVE) MINUTES AS NEEDED FOR CHEST PAIN.  Marland Kitchen Olopatadine HCl 0.7 % SOLN Place 1 drop into both eyes daily as needed (itching/allergies).  . ondansetron (ZOFRAN) 4 MG tablet Take 1 tablet (4 mg total) by mouth every 6 (six) hours as needed for nausea or vomiting.  . ondansetron (ZOFRAN-ODT) 4 MG disintegrating tablet TAKE 1 TABLET BY MOUTH EVERY 8 HOURS AS NEEDED FOR NAUSEA AND VOMITING  . OneTouch Delica Lancets 98X MISC 1 each by Does not apply route daily at 12 noon.  . Pancrelipase, Lip-Prot-Amyl, (ZENPEP) 20000-63000 units CPEP TAKE 2 CAPSULES (40,000 UNITS) WITH MEALS AND 1 CAPSULE (20,000 UNITS) WITH A SNACK  . pantoprazole (PROTONIX) 40 MG tablet TAKE 1 TABLET BY MOUTH TWICE A DAY  . Polyethyl Glycol-Propyl Glycol (SYSTANE OP) Place 1 drop into both eyes daily. For dry eyes  . potassium chloride SA (KLOR-CON M20) 20 MEQ tablet Take 1  tablet (20 mEq total) by mouth 3 (three) times daily.  Marland Kitchen tiZANidine (ZANAFLEX) 4 MG tablet TAKE 1 TABLET BY MOUTH TWICE A DAY   Facility-Administered Encounter Medications as of 09/14/2020  Medication  . ipratropium-albuterol (DUONEB) 0.5-2.5 (3) MG/3ML nebulizer solution 3 mL   I spoke with the patient and discussed medication adherence. the patient stated that she had fallen one week ago. She did see her PCP about her fall. She has been taking her blood pressure infrequently. She does continue to take her blood sugars daily. It was suggested that she get a notebook and two different color pens to help her track blood pressure readings and blood sugars. She understood. She did have an increase in Hydrocodone-acetaminophen (NORCO) 10-325 MG tablet every six hours as needed. She states that she is not experiencing any side effects from her current medications. There have been no other changes to her medicine.  There have been no urgent care or emergency department visits since our last CPP or PCP visit. She denies any issues with her current pharmacy. A new CCM appointment was scheduled for July 19th at 10:00 am.  Star Rating Drugs:  Dispensed Quantity Pharmacy  Metformin 1,000 mg [facility].28.2022 [facility]  Glimepiride 1 mg 04.07.2022 [facility]   [place] ([place]) [place], [place] [phone]

## 2020-10-11 ENCOUNTER — Other Ambulatory Visit: Payer: Self-pay

## 2020-10-11 ENCOUNTER — Telehealth (INDEPENDENT_AMBULATORY_CARE_PROVIDER_SITE_OTHER): Payer: PPO | Admitting: Family Medicine

## 2020-10-11 ENCOUNTER — Encounter: Payer: Self-pay | Admitting: Family Medicine

## 2020-10-11 DIAGNOSIS — R300 Dysuria: Secondary | ICD-10-CM | POA: Diagnosis not present

## 2020-10-11 MED ORDER — NITROFURANTOIN MONOHYD MACRO 100 MG PO CAPS: 100.0000 mg | ORAL_CAPSULE | Freq: Two times a day (BID) | ORAL | 0 refills | Status: DC

## 2020-10-11 NOTE — Patient Instructions (Signed)
-  I sent the medication(s) we discussed to your pharmacy: Meds ordered this encounter  Medications   nitrofurantoin, macrocrystal-monohydrate, (MACROBID) 100 MG capsule    Sig: Take 1 capsule (100 mg total) by mouth 2 (two) times daily.    Dispense:  14 capsule    Refill:  0     I hope you are feeling better soon!  Seek in person care promptly if your symptoms worsen, new concerns arise or you are not improving with treatment.  It was nice to meet you today. I help [facility] out with telemedicine visits on Tuesdays and Thursdays and am available for visits on those days. If you have any concerns or questions following this visit please schedule a follow up visit with your Primary Care doctor or seek care at a local urgent care clinic to avoid delays in care.

## 2020-10-11 NOTE — Progress Notes (Signed)
Virtual Visit via Telephone Note  I connected with [name] on 10/11/20 at  4:40 PM EDT by telephone and verified that I am speaking with the correct person using two identifiers.   I discussed the limitations, risks, security and privacy concerns of performing an evaluation and management service by telephone and the availability of in person appointments. I also discussed with the patient that there may be a patient responsible charge related to this service. The patient expressed understanding and agreed to proceed.  Location patient: home, [place] Location provider: work or home office Participants present for the call: patient, provider Patient did not have a visit with me in the prior 7 days to address this/these issue(s).   History of Present Illness:  Acute telemedicine visit for dysuria: -Onset: 1-2 days ago -Symptoms include: urinary frequency, urgency and mild discomfort when urinary -Denies:fevers, nausea, flank pain, hematuria. NVD, vaginal symptoms, abd/pelvic pain -Pertinent past medical history: UTI and this feels similar -Pertinent medication allergies:  Allergies  Allergen Reactions   Doxycycline Other (See Comments)    Unsteady gait   Aspirin Nausea And Vomiting   Azithromycin Other (See Comments)    REACTION: pt states "ZPak doesn't work"   Caffeine Nausea And Vomiting   Ciprofloxacin Nausea And Vomiting    Tolerates levaquin   Codeine Nausea And Vomiting   Oxycodone     Pt stated, "upsets my stomach" (11/19/17)   Sulfamethizole Other (See Comments)    Unknown reaction   Tramadol Hcl Other (See Comments)    REACTION: pt states "spaced-out"     Observations/Objective: Patient sounds cheerful and well on the phone. I do not appreciate any SOB. Speech and thought processing are grossly intact. Patient reported vitals:  Assessment and Plan:  Dysuria  -we discussed possible serious and likely etiologies, options for evaluation and workup, limitations of  telemedicine visit vs in person visit, treatment, treatment risks and precautions. Pt prefers to treat via telemedicine empirically rather than in person at this moment. She reports she and her duaghter feel like this is a UTI. She prefers to try empiric treatment for possible uncomplicated cystitis. Macrobid sent to pharmacy of choice. Advised to seek prompt in person care if worsening, new symptoms arise, or if is not improving with treatment. Advised of options for inperson care in case PCP office not available. Did let the patient know that I only do telemedicine shifts for [facility] on Tuesdays and Thursdays and advised a follow up visit with PCP or at an [facility] if has further questions or concerns.   Follow Up Instructions:  I did not refer this patient for an OV with me in the next 24 hours for this/these issue(s).  I discussed the assessment and treatment plan with the patient. The patient was provided an opportunity to ask questions and all were answered. The patient agreed with the plan and demonstrated an understanding of the instructions.   I spent 11 minutes on the date of this visit in the care of this patient. See summary of tasks completed to properly care for this patient in the detailed notes above which also included counseling of above, review of PMH, medications, allergies, evaluation of the patient and ordering and/or  instructing patient on testing and care options.     Lucretia Kern, DO

## 2020-10-13 ENCOUNTER — Other Ambulatory Visit: Payer: Self-pay | Admitting: Family Medicine

## 2020-10-17 DIAGNOSIS — J309 Allergic rhinitis, unspecified: Secondary | ICD-10-CM | POA: Diagnosis not present

## 2020-10-17 DIAGNOSIS — K219 Gastro-esophageal reflux disease without esophagitis: Secondary | ICD-10-CM | POA: Diagnosis not present

## 2020-10-17 DIAGNOSIS — J4540 Moderate persistent asthma, uncomplicated: Secondary | ICD-10-CM | POA: Diagnosis not present

## 2020-10-25 ENCOUNTER — Other Ambulatory Visit: Payer: Self-pay | Admitting: Gastroenterology

## 2020-11-01 ENCOUNTER — Other Ambulatory Visit: Payer: Self-pay | Admitting: Gastroenterology

## 2020-11-04 ENCOUNTER — Other Ambulatory Visit: Payer: Self-pay | Admitting: Cardiology

## 2020-11-10 ENCOUNTER — Telehealth: Payer: Self-pay | Admitting: Gastroenterology

## 2020-11-10 NOTE — Telephone Encounter (Signed)
Inbound call from patient requesting additional refills for [facility].

## 2020-11-10 NOTE — Telephone Encounter (Signed)
She is due for an office visit.  Please be sure she is taking: Zenpep 20,000U lipase 2 po with meals and 1 with snacks Lialda 2.4 g twice daily   Glycopyrrolate 1 mg p.o. twice daily Imodium 1-2 po tid prn diarrhea  Advise patient to discuss a temporary hold of Metformin and then Mag Ox with Dr. Sarajane Jews to assess if they are contributing to diarrhea. If after doing all the above she still has diarrhea then OK to refill Lomotil 2.5 - 0.025 mg 1 po bid, #60, 2 refills

## 2020-11-10 NOTE — Telephone Encounter (Signed)
Dr. Fuller Plan,  Is Lomotil ok to refill?

## 2020-11-11 MED ORDER — GLYCOPYRROLATE 1 MG PO TABS: 1.0000 mg | ORAL_TABLET | Freq: Two times a day (BID) | ORAL | 5 refills | Status: DC

## 2020-11-11 NOTE — Telephone Encounter (Signed)
Patient has been taking Zenpep, Lialda and OTC Imodium as prescribed and recommended. Patient states she does not remember taking glycopyrrolate. Patient does state she has temporarily stopped taking her metformin and mag ox recommended by Dr. Sarajane Jews and it did not help with her symptoms. Informed patient to continue her medications and I will send glycopyrrolate to her pharmacy. Informed patient if this does not help with her diarrhea to call our office back and we will send Lomotil to her pharmacy. Patient agreed and verbalized understanding.

## 2020-11-14 ENCOUNTER — Telehealth: Payer: Self-pay

## 2020-11-14 ENCOUNTER — Telehealth: Payer: Self-pay | Admitting: Pharmacist

## 2020-11-14 ENCOUNTER — Other Ambulatory Visit: Payer: Self-pay | Admitting: Gastroenterology

## 2020-11-14 ENCOUNTER — Telehealth: Payer: Self-pay | Admitting: Gastroenterology

## 2020-11-14 MED ORDER — PANTOPRAZOLE SODIUM 40 MG PO TBEC
40.0000 mg | DELAYED_RELEASE_TABLET | Freq: Two times a day (BID) | ORAL | 0 refills | Status: DC
Start: 2020-11-14 — End: 2021-08-10

## 2020-11-14 NOTE — Telephone Encounter (Signed)
Inbound call from pt stating that she [facility] [address]. Please advise. Thanks

## 2020-11-14 NOTE — Chronic Care Management (AMB) (Signed)
Date- Patient called to remind of appointment with Watt Climes on 07.19.2022 at 10:00 am  Patient aware of appointment date, time, and type of appointment ( telephone). Patient aware to have/bring all medications, supplements, blood pressure and/or blood sugar logs to visit.  Questions: Have you had any recent office visit or specialist visit outside of [place]? No Are there any concerns you would like to discuss during your office visit? No Are you having any problems obtaining your medications? (Whether it pharmacy issues or cost) No  Care Gaps: Foot Exam Eye Exam COVID-19 TDAP Hepatitis C screening  Star Rating Drug: Medication Dispensed Quantity Pharmacy  Atorvastatin 20 mg [facility]  Losartan 50 mg [facility]  Metformin 1000 mg [facility]29[facility]22 [facility]    Any gaps in medications fill history? No   Maia Breslow, [place] Pharmacist Assistant [phone]

## 2020-11-14 NOTE — Telephone Encounter (Signed)
Patient called in to update her pharmacy information. She states she does not need any refills at this time.

## 2020-11-14 NOTE — Telephone Encounter (Signed)
Prescription transferred to [place].

## 2020-11-15 ENCOUNTER — Ambulatory Visit (INDEPENDENT_AMBULATORY_CARE_PROVIDER_SITE_OTHER): Payer: PPO | Admitting: Pharmacist

## 2020-11-15 ENCOUNTER — Other Ambulatory Visit: Payer: Self-pay

## 2020-11-15 DIAGNOSIS — I10 Essential (primary) hypertension: Secondary | ICD-10-CM | POA: Diagnosis not present

## 2020-11-15 DIAGNOSIS — E039 Hypothyroidism, unspecified: Secondary | ICD-10-CM

## 2020-11-15 MED ORDER — CLOPIDOGREL BISULFATE 75 MG PO TABS: 75.0000 mg | ORAL_TABLET | Freq: Every day | ORAL | 0 refills | Status: DC

## 2020-11-15 NOTE — Progress Notes (Signed)
Chronic Care Management Pharmacy Note  11/30/2020 Name:  [name] MRN:  [id number] DOB:  09/10/1940  Summary: Pt had questions about timing of her medications and GI side effects LDL not at goal < 70 A1c not at goal < 7%  Recommendations/Changes made from today's visit: -Recommend switching metformin to XR formulation to minimize GI side effects -Recommended timing of Lialda and Zenpep with food and pantoprazole prior to meals  Plan: Follow up tolerance and DM assessment in 2-3 weeks   Subjective: [name] is an 80 y.o. year old female who is a primary patient of [name] Morale, MD.  The CCM team was consulted for assistance with disease management and care coordination needs.    Engaged with patient by telephone for follow up visit in response to provider referral for pharmacy case management and/or care coordination services.   Consent to Services:  The patient was given information about Chronic Care Management services, agreed to services, and gave verbal consent prior to initiation of services.  Please see initial visit note for detailed documentation.   Patient Care Team: [name] Morale, MD as PCP - General (Family Medicine) [name] Pain, MD as PCP - Cardiology (Cardiology) [name] Partridge, DO as Consulting Physician (Neurology) [name], [facility] as Pharmacist (Pharmacist)  Recent office visits: 10/11/20 [name] Benton, DO: Patient presented for video visit for dysuria. Prescribed Macrobid.  08/29/20 [name] Penna, MD: Patient presented for a video visit post fall. Prescribed hydrocodone 10-325 mg 1 tablet every 6 hours as needed.  07/18/20 [name] Penna, MD: Patient presented for diabetes and HTN follow up. Removed hydroxyzine from medication list.  05/30/20 [name] Penna, MD: Patient presented for [name] management. Refilled hydrocodone 10-325 mg 1 tablet every 6 hours as needed.  Recent consult visits: 11/10/20 Gastroenterology telephone encounter: Recommended for  patient to restart metformin and magnesium oxide and prescribed glycopyrrolate.  08/09/20 [name] (sports medicine): Patient presented for arthritis. Unable to access notes.  08/02/20 [name] (family medicine): Patient presented for fibromyalgia. Unable to access notes.  07/07/20 [name] (sports medicine): Unable to access notes.  06/28/20 [name] (sports medicine): Patient presented for arthritis. Unable to access notes.  05/31/20 [name] (sports medicine): Patient presented for arthritis. Unable to access notes.  Hospital visits: None in previous 6 months   Objective:  Lab Results  Component Value Date   CREATININE 0.60 07/18/2020   BUN 18 07/18/2020   GFR 86.24 07/18/2020   GFRNONAA 89 09/08/2019   GFRAA 102 09/08/2019   NA 139 07/18/2020   K 4.5 07/18/2020   CALCIUM 9.4 07/18/2020   CO2 30 07/18/2020   GLUCOSE 152 (H) 07/18/2020    Lab Results  Component Value Date/Time   HGBA1C 7.7 (H) 07/18/2020 11:43 AM   HGBA1C 6.6 (H) 09/08/2019 11:34 AM   GFR 86.24 07/18/2020 11:43 AM   GFR 138.51 02/25/2019 10:16 AM    Last diabetic Eye exam:  Lab Results  Component Value Date/Time   HMDIABEYEEXA No Retinopathy 08/20/2019 12:00 AM    Last diabetic Foot exam: No results found for: HMDIABFOOTEX   Lab Results  Component Value Date   CHOL 159 07/18/2020   HDL 66.10 07/18/2020   LDLCALC 78 07/18/2020   TRIG 71.0 07/18/2020   CHOLHDL 2 07/18/2020    Hepatic Function Latest Ref Rng & Units 07/18/2020 12/10/2019 09/08/2019  Total Protein 6.0 - 8.3 g/dL 7.2 6.6 7.4  Albumin 3.5 - 5.2 g/dL 4.4 4.5 4.7  AST 0 -  37 U/L _0 ALT 0 - 35 U/L _1 Alk Phosphatase 39 - 117 U/L 55 63 73  Total Bilirubin 0.2 - 1.2 mg/dL 0.4 0.5 0.4  Bilirubin, Direct 0.0 - 0.3 mg/dL 0.1 0.19 0.14    Lab Results  Component Value Date/Time   TSH 2.45 07/18/2020 11:43 AM   TSH 0.96 12/15/2018 02:56 PM   FREET4 0.86 07/18/2020 11:43 AM    CBC Latest Ref Rng &  Units 07/18/2020 09/08/2019 02/25/2019  WBC 4.0 - 10.5 K/uL 13.2(H) 8.9 12.7(H)  Hemoglobin 12.0 - 15.0 g/dL 12.0 12.5 11.3(L)  Hematocrit 36.0 - 46.0 % 36.4 38.0 34.8(L)  Platelets 150.0 - 400.0 K/uL 466.0(H) 462(H) 496.0(H)    Lab Results  Component Value Date/Time   VD25OH 29 (L) 08/11/2013 11:46 AM   VD25OH 39 08/06/2012 11:15 AM    Clinical ASCVD: Yes  The 10-year ASCVD risk score [name] DC Jr., et al., 2013) is: 31.8%   Values used to calculate the score:     Age: 80 years     Sex: Female     Is Non-Hispanic African American: Yes     Diabetic: Yes     Tobacco smoker: No     Systolic Blood Pressure: 584 mmHg     Is BP treated: Yes     HDL Cholesterol: 66.1 mg/dL     Total Cholesterol: 159 mg/dL    Depression screen [facility] 2/9 05/11/2020 02/06/2018  Decreased Interest 0 0  Down, Depressed, Hopeless 0 0  PHQ - 2 Score 0 0  Altered sleeping 0 -  Tired, decreased energy 0 -  Change in appetite 0 -  Feeling bad or failure about yourself  0 -  Trouble concentrating 0 -  Moving slowly or fidgety/restless 0 -  Suicidal thoughts 0 -  PHQ-9 Score 0 -  Difficult doing work/chores Not difficult at all -  Some recent data might be hidden     Social History   Tobacco Use  Smoking Status Former   Packs/day: 2.00   Years: 15.00   Pack years: 30.00   Types: Cigarettes   Quit date: 04/30/1973   Years since quitting: 47.6  Smokeless Tobacco Never  Tobacco Comments   ex-smoker: smoked for 10-15 years up to 2ppd, quit 1975.   BP Readings from Last 3 Encounters:  07/18/20 120/68  03/11/20 120/60  03/01/20 124/70   Pulse Readings from Last 3 Encounters:  07/18/20 78  03/11/20 66  03/01/20 90   Wt Readings from Last 3 Encounters:  07/18/20 212 lb 12.8 oz (96.5 kg)  05/25/20 195 lb (88.5 kg)  03/11/20 205 lb (93 kg)   BMI Readings from Last 3 Encounters:  07/18/20 41.56 kg/m  05/25/20 38.08 kg/m  03/11/20 40.04 kg/m    Assessment/Interventions: Review of patient  past medical history, allergies, medications, health status, including review of consultants reports, laboratory and other test data, was performed as part of comprehensive evaluation and provision of chronic care management services.   SDOH:  (Social Determinants of Health) assessments and interventions performed: No  SDOH Screenings   Alcohol Screen: Low Risk    Last Alcohol Screening Score (AUDIT): 2  Depression (PHQ2-9): Low Risk    PHQ-2 Score: 0  Financial Resource Strain: Low Risk    Difficulty of Paying Living Expenses: Not hard at all  Food Insecurity: No Food Insecurity   Worried About Charity fundraiser in the Last Year: Never true   Ran  Out of Food in the Last Year: Never true  Housing: Low Risk    Last Housing Risk Score: 0  Physical Activity: Inactive   Days of Exercise per Week: 0 days   Minutes of Exercise per Session: 0 min  Social Connections: Moderately Isolated   Frequency of Communication with Friends and Family: More than three times a week   Frequency of Social Gatherings with Friends and Family: More than three times a week   Attends Religious Services: More than 4 times per year   Active Member of [employer] or Organizations: No   Attends Archivist Meetings: Never   Marital Status: Widowed  Stress: No Stress Concern Present   Feeling of Stress : Not at all  Tobacco Use: Medium Risk   Smoking Tobacco Use: Former   Smokeless Tobacco Use: Never  Transportation Needs: No Data processing manager (Medical): No   Lack of Transportation (Non-Medical): No    CCM Care Plan  Allergies  Allergen Reactions   Doxycycline Other (See Comments)    Unsteady gait   Aspirin Nausea And Vomiting   Azithromycin Other (See Comments)    REACTION: pt states "ZPak doesn't work"   Caffeine Nausea And Vomiting   Ciprofloxacin Nausea And Vomiting    Tolerates levaquin   Codeine Nausea And Vomiting   Oxycodone     Pt stated, "upsets my  stomach" (11/19/17)   Sulfamethizole Other (See Comments)    Unknown reaction   Tramadol Hcl Other (See Comments)    REACTION: pt states "spaced-out"    Medications Reviewed Today     Reviewed by [name] Morale, MD (Physician) on 11/23/20 at 1407  Med List Status: <None>   Medication Order Taking? Sig Documenting Provider Last Dose Status Informant  acetaminophen (TYLENOL) 500 MG tablet [id number] No Take 500 mg by mouth every 6 (six) hours as needed for headache ([name]).  [provider] Taking Active Self  albuterol (PROVENTIL) (2.5 MG/3ML) 0.083% nebulizer solution [id number] No Take 3 mLs (2.5 mg total) by nebulization every 4 (four) hours as needed for wheezing or shortness of breath. [name] Morale, MD Taking Active Self  albuterol (VENTOLIN HFA) 108 (90 BASE) MCG/ACT inhaler [id number] No Inhale 2 puffs into the lungs every 4 (four) hours as needed for wheezing or shortness of breath. [name] Morale, MD Taking Active Self  ALPRAZolam Duanne Moron) 0.5 MG tablet [id number] No TAKE 1 TABLET (0.5 MG TOTAL) BY MOUTH 3 (THREE) TIMES DAILY AS NEEDED. [name] Morale, MD Taking Active   amLODipine (NORVASC) 10 MG tablet [id number] No TAKE 1 TABLET BY MOUTH EVERY DAY [name] Pain, MD Taking Active   atenolol (TENORMIN) 50 MG tablet [id number]  TAKE 1 TABLET BY MOUTH EVERY DAY [name] Pain, MD  Active   atorvastatin (LIPITOR) 20 MG tablet [id number] No Take 1 tablet (20 mg total) by mouth daily. [name] Pain, MD Taking Active   azelastine (ASTELIN) 0.1 % nasal spray [id number] No Place 1 spray into both nostrils at bedtime. [provider] Taking Active Self  Blood Glucose Monitoring Suppl ([place]) w/Device KIT [id number] No 1 each by Does not apply route daily at 12 noon. [name] Morale, MD Taking Active   budesonide-formoterol [facility]) 160-4.5 MCG/ACT inhaler [id number] No Inhale 2 puffs into the lungs 2 (two) times daily. [provider] Taking Active Self   Cholecalciferol (VITAMIN D-3) 5000 UNITS TABS [id number] No Take 5,000 Units by mouth daily.  [provider] Taking Active Self  clopidogrel (PLAVIX) 75 MG tablet [id number]  Take 1 tablet (75 mg total) by mouth daily. [name] Pain, MD  Active   diclofenac sodium (VOLTAREN) 1 % GEL [id number] No As needed for [name] [provider] Taking Active   diphenoxylate-atropine (LOMOTIL) 2.5-0.025 MG tablet [id number] No TAKE 1 TABLET BY MOUTH 3 TIMES A DAY AS NEEDED FOR DIARRHEA OR LOOSE STOOLS. Ladene Artist, MD Taking Active   Ferrous Sulfate (IRON) 325 (65 Fe) MG TABS [id number] No Take 1 tablet (325 mg total) by mouth 2 (two) times daily. Ladene Artist, MD Taking Active   fluticasone [facility]) 50 MCG/ACT nasal spray [id number] No PLACE 2 SPRAYS IN EACH NOSTRIL AT BEDTIME [name] Morale, MD Taking Active Self  furosemide (LASIX) 40 MG tablet [id number] No Take 1 tablet (40 mg total) by mouth 2 (two) times daily. [name] Morale, MD Taking Active   gabapentin (NEURONTIN) 600 MG tablet [id number] No Take 1 tablet (600 mg total) by mouth 3 (three) times daily. [name] Morale, MD Taking Active   glimepiride (AMARYL) 1 MG tablet [id number] No TAKE 1 TABLET BY MOUTH EVERY DAY BEFORE BREAKFAST [name] Morale, MD Taking Active   glucose blood [facility] VERIO) test strip [id number] No Use to check blood glucose once daily [name] Morale, MD Taking Active   glycopyrrolate (ROBINUL) 1 MG tablet [id number]  Take 1 tablet (1 mg total) by mouth 2 (two) times daily. Ladene Artist, MD  Active   HYDROcodone-acetaminophen [facility]) 10-325 MG tablet [id number] No Take 1 tablet by mouth every 6 (six) hours as needed for moderate [name]. [name] Morale, MD Taking Active   hydrOXYzine (VISTARIL) 25 MG capsule [id number] No TAKE 1 CAPSULE (25 MG TOTAL) BY MOUTH EVERY 6 (SIX) HOURS AS NEEDED. [name] Morale, MD Taking Active   ipratropium-albuterol (DUONEB) 0.5-2.5 (3) MG/3ML nebulizer solution 3 mL [id number]   Dorothyann Peng, NP  Active   isosorbide mononitrate (IMDUR) 60 MG 24 hr tablet [id number] No TAKE 1 TABLET BY MOUTH EVERY DAY [name] Pain, MD Taking Active   levocetirizine (XYZAL) 5 MG tablet [id number] No Take 5 mg by mouth every evening. [provider] Taking Active   LIDODERM 5 % [id number] No APPLY 1 PATCH TO SKIN FOR 12 HOURS THEN REMOVE AND DISCARD PATCH WITHIN 71 HOURS OR AS DIRECTED Noralee Space, MD Taking Active Self           Med Note [facility], CHASITIE R   Thu Sep 29, 2015 10:05 AM)    loperamide (IMODIUM) 2 MG capsule [id number] No Take 4-6 mg by mouth daily. [provider] Taking Active Self  losartan (COZAAR) 50 MG tablet [id number] No TAKE 1 TABLET BY MOUTH EVERY DAY [name] Pain, MD Taking Active   magnesium oxide (MAG-OX) 400 MG tablet [id number] No Take 2 tablets (800 mg total) by mouth daily. [name] Morale, MD Taking Active   mesalamine (LIALDA) 1.2 g EC tablet [id number] No Take 2 tablets (2.4 g total) by mouth 2 (two) times daily. Ladene Artist, MD Taking Active   metFORMIN (GLUCOPHAGE) 1000 MG tablet [id number] No TAKE 1 TABLET (1,000 MG TOTAL) BY MOUTH 2 (TWO) TIMES DAILY WITH A MEAL. [name] Morale, MD Taking Active   montelukast (SINGULAIR) 10 MG tablet [id number] No Take one every morning [name] Morale, MD Taking Active   nabumetone (RELAFEN) 750 MG tablet [id number]  TAKE 1 TABLET BY MOUTH  TWICE A DAY WITH MEAL [name] Morale, MD  Active   neomycin-polymyxin-hydrocortisone (CORTISPORIN) OTIC solution [id number] No Place 4 drops into both ears 4 (four) times daily. [name] Morale, MD Taking Active   nitroGLYCERIN (NITROSTAT) 0.4 MG SL tablet [id number] No PLACE 1 TABLET (0.4 MG TOTAL) UNDER THE TONGUE EVERY 5 (FIVE) MINUTES AS NEEDED FOR CHEST [name]. [name] Pain, MD Taking Active   Olopatadine HCl 0.7 % SOLN [id number] No Place 1 drop into both eyes daily as needed (itching/allergies). [provider] Taking Active Self  ondansetron (ZOFRAN-ODT) 4 MG  disintegrating tablet [id number] No TAKE 1 TABLET BY MOUTH EVERY 8 HOURS AS NEEDED FOR NAUSEA AND VOMITING Ladene Artist, MD Taking Active   OneTouch Delica Lancets 71H MISC [id number] No 1 each by Does not apply route daily at 12 noon. [name] Morale, MD Taking Active   Pancrelipase, Lip-Prot-Amyl, (ZENPEP) 20000-63000 units CPEP [id number] No TAKE 2 CAPSULES (40,000 UNITS) WITH MEALS AND 1 CAPSULE (20,000 UNITS) WITH A SNACK Ladene Artist, MD Taking Active   pantoprazole (PROTONIX) 40 MG tablet [id number] No Take 1 tablet (40 mg total) by mouth 2 (two) times daily. Ladene Artist, MD Taking Active   Polyethyl Glycol-Propyl Glycol [facility]) [id number] No Place 1 drop into both eyes daily. For dry eyes [provider] Taking Active Self  potassium chloride SA (KLOR-CON M20) 20 MEQ tablet [id number] No Take 1 tablet (20 mEq total) by mouth 3 (three) times daily. [name] Pain, MD Taking Active   tiZANidine (ZANAFLEX) 4 MG tablet [id number] No TAKE 1 TABLET BY MOUTH TWICE A DAY [name] Morale, MD Taking Active   Med List Note Ladene Artist, MD 03/01/20 1414):  ++++++++++++++++++++++++++++++++++++++++++++++++++++++++++++++++++++++++++++++++++++++++++++++++++++++++++++++++++++ +++++++++ ++++++++++++ + + +++++++++++++++++++++ + + +            Patient Active Problem List   Diagnosis Date Noted   Leg cramps 07/18/2020   H. pylori infection 05/12/2019   Low back [name] 10/21/2018   Fibromyalgia 01/03/2017   Left sided colitis ([place]) 05/13/2015   Diarrhea 08/04/2013   Hyperlipidemia 12/07/2011   Hematuria 11/27/2011   Ulcerative colitis ([place]) 11/25/2011   Chronic diastolic CHF (congestive heart failure) ([place]) 11/25/2011   Lower extremity edema 11/25/2011   Nonischemic cardiomyopathy ([place]) 04/17/2011   CAD (coronary artery disease)    Bronchiectasis ([place])    Essential hypertension    Vitamin D deficiency 12/03/2009   Depression 08/10/2008   PULMONARY NODULE 07/15/2008    NEOPLASM, KIDNEY 03/16/2008   STRESS INCONTINENCE 03/16/2008   COLONIC POLYPS 07/19/2007   Diabetes mellitus without complication ([place]) 90/21/1155   OBESITY 07/19/2007   ANEMIA 07/19/2007   Asthma 07/19/2007   DIVERTICULOSIS OF COLON 07/19/2007   ANXIETY 03/19/2007   GERD 03/19/2007   Osteoarthritis 03/19/2007   FIBROMYALGIA 03/19/2007   Allergic state 03/19/2007    Immunization History  Administered Date(s) Administered   Influenza, High Dose Seasonal PF 02/17/2019   PFIZER(Purple Top)SARS-COV-2 Vaccination 03/17/2020, 04/09/2020   Pneumococcal Conjugate-13 03/15/2015   Pneumococcal Polysaccharide-23 05/21/2006, 02/23/2015, 05/21/2016, 08/14/2018, 02/17/2019, 03/17/2020   Patient reports her biggest concern right now is her GI discomfort. Her gastroenterologist just prescribed a new medication that she started yesterday. She is wondering if any of her medications could be contributing.  Conditions to be addressed/monitored:  Hypertension, Hyperlipidemia, Diabetes, Heart Failure, Coronary Artery Disease, GERD, Asthma, Depression, Anxiety, Osteoarthritis, and Allergic Rhinitis  Care Plan : [place]  Updates made by [name],  [place] since 11/30/2020 12:00 AM     Problem: Problem: Hypertension, Hyperlipidemia, Diabetes, Heart Failure, Coronary Artery Disease, GERD, Asthma, Depression, Anxiety, Osteoarthritis, and Allergic Rhinitis      Long-Range Goal: Patient-Specific Goal   Start Date: 11/15/2020  Expected End Date: 11/15/2021  This Visit's Progress: On track  Priority: High  Note:   Current Barriers:  Unable to independently monitor therapeutic efficacy Unable to achieve control of diabetes and cholesterol   Pharmacist Clinical Goal(s):  Patient will achieve adherence to monitoring guidelines and medication adherence to achieve therapeutic efficacy achieve control of diabetes as evidenced by A1c  through collaboration with PharmD and provider.    Interventions: 1:1 collaboration with [name] Morale, MD regarding development and update of comprehensive plan of care as evidenced by provider attestation and co-signature Inter-disciplinary care team collaboration (see longitudinal plan of care) Comprehensive medication review performed; medication list updated in electronic medical record  Hypertension  (Status:Goal on track: YES.)   Med Management Intervention:  No medication changes  (BP goal <130/80) -Controlled -Current treatment: Losartan 50 mg 1 tablet daily Amlodipine 10 mg 1 tablet daily Atenolol 50 mg 1 tablet every day Furosemide 40 mg 1 tablet twice daily -Medications previously tried: n/a  -Current home readings: none -Current dietary habits: limits salt intake -Current exercise habits: limited -Denies hypotensive/hypertensive symptoms -Educated on Importance of home blood pressure monitoring; Proper BP monitoring technique; -Counseled to monitor BP at home weekly, document, and provide log at future appointments -Counseled on diet and exercise extensively Recommended to continue current medication  Hyperlipidemia: (LDL goal < 70) -Uncontrolled -Current treatment: Atorvastatin 20 mg 1 tablet daily -Medications previously tried: none  -Current dietary patterns: not eating out as much -Current exercise habits: limited -Educated on Cholesterol goals;  Benefits of statin for ASCVD risk reduction; Importance of limiting foods high in cholesterol; -Counseled on diet and exercise extensively Recommended to continue current medication  CAD (Goal: prevent heart events) -Controlled -Current treatment  Atorvastatin 20 mg 1 tablet daily Clopidogrel 75 mg 1 tablet daily Isosorbide 60 mg 1 tablet daily Nitroglycerin 0.4 mg SL tablet as needed -Medications previously tried: aspirin (N/V) -Recommended to continue current medication   Diabetes (A1c goal <7%) -Uncontrolled -Current medications: Glimepiride 1 mg  1 tablet daily before breakfast Metformin 1000 mg 1 tablet twice daily -Medications previously tried: none  -Current home glucose readings fasting glucose: usually in the 100s; checking twice a week; higher when she takes the steroids post prandial glucose: does not check -Denies hypoglycemic/hyperglycemic symptoms -Current meal patterns:  breakfast: did not discuss  lunch: did not discuss  dinner: did not discuss; hard to eat greens snacks: cut out the cookies but enjoys ice cream drinks: did not discuss -Current exercise: limited -Educated on A1c and blood sugar goals; Benefits of routine self-monitoring of blood sugar; Carbohydrate counting and/or plate method -Counseled to check feet daily and get yearly eye exams -Counseled on diet and exercise extensively Recommended switching to metformin XR to see if this helps with GI symptoms  Heart Failure (Goal: manage symptoms and prevent exacerbations) -Controlled -Last ejection fraction: 55-60 (Date: 12/27/16) -HF type: Diastolic -NYHA Class: II (slight limitation of activity) -AHA HF Stage: C (Heart disease and symptoms present) -Current treatment: Losartan 50 mg 1 tablet daily Atenolol 50 mg 1 tablet every day Furosemide 40 mg 1 tablet twice daily -Medications previously tried: none  -Current home BP/HR readings: does not check -Current dietary habits: limits salt intake -Current exercise habits: limited -Educated on Importance  of weighing daily; if you gain more than 3 pounds in one day or 5 pounds in one week, call cardiologist -Counseled on diet and exercise extensively Recommended to continue current medication  Asthma (Goal: control symptoms) -Controlled -Current treatment  Symbicort 160-4.5 mcg/act inhale 2 puffs twice daily Albuterol HFA 1 puff as needed -Medications previously tried: none  -Pulmonary function testing: none -Patient reports consistent use of maintenance inhaler -Frequency of rescue inhaler use:  not often -Counseled on Benefits of consistent maintenance inhaler use When to use rescue inhaler -Recommended to continue current medication  Depression/Anxiety (Goal: minimize symptoms) -Not ideally controlled -Current treatment: Alprazolam 0.5 mg 1 tablet three times daily as needed -Medications previously tried/failed: n/a -PHQ9: 0 -GAD7: n/a -Educated on Benefits of medication for symptom control - Plan to reassess at next visit.  Allergic rhinitis (Goal: minimize symptoms) -Controlled -Current treatment  Fluticasone 50 mcg/act 2 sprays in each nostril at bedtime Levocetirizine 5 mg 1 tablet daily Azelsatine 0.1% 1 spray in both nostrils at bedtime -Medications previously tried: levocetirizine  -Recommended to continue current medication  Osteoarthritis/[name] (Goal: minimize [name]) -Uncontrolled -Current treatment  Diclofenac gel 1% as needed for [name] Gabapentin 600 mg 1 tablet three times daily Hydrocodone-APAP 10-325 mg 1 tablet every 6 hours as needed Tizanidine 4 mg 1 tablet twice daily Nabumetone 750 mg 1 tablet twice daily with a meal Lidoderm 5% apply 1 patch for 12 hours -Medications previously tried: several other medications (unknown names)  -Counseled on avoidance of other NSAIDs.  GI symptoms (Goal: minimize symptoms) -Uncontrolled -Current treatment  Zenpep 20,000 units 2 capsules with a meal and 1 capsule with a snack Pantoprazole 40 mg 1 tablet twice daily Lialda 1.2 g 2 tablets twice daily Loperamide 2 mg Glycopyrrolate 1 mg 1 tablet twice daily -Medications previously tried: n/a  -Counseled on timing of medications - recommended taking Lialda with food, Zenpep with food and pantoprazole prior to meals.   Anemia (Goal: ferritin 24-336 ) -Controlled -Current treatment  Ferrous sulfate 325 mg 1 tablet twice daily -Medications previously tried: none  -Recommended to continue current medication  Low vitamin D (Goal: 30-100) -Uncontrolled -Current  treatment  Vitamin D 50000 units once a week -Medications previously tried: none  -Recommended repeat vitamin D level   Health Maintenance -Vaccine gaps: shingrix, tetanus, COVID booster -Current therapy:  None -Educated on Cost vs benefit of each product must be carefully weighed by individual consumer -Patient is satisfied with current therapy and denies issues -Recommended to continue current medication  Patient Goals/Self-Care Activities Patient will:  - take medications as prescribed check glucose 2-3 times a week, document, and provide at future appointments check blood pressure weekly, document, and provide at future appointments target a minimum of 150 minutes of moderate intensity exercise weekly engage in dietary modifications by limiting sweets  Follow Up Plan: Telephone follow up appointment with care management team member scheduled for: 3 months       Medication Assistance: None required.  Patient affirms current coverage meets needs.  Compliance/Adherence/Medication fill history: Care Gaps: Hep C screening, shingrix, tetanus, foot exam, eye exam, COVID booster  Star-Rating Drugs: Atorvastatin 20 mg - [facility] Losartan 50 mg - last [facility]04[facility] Metformin 1000 mg - last [facility]25/[facility]  [id number] [address] [place] [address]Phone: [phone]: [phone] Uses  pill box? Yes Pt endorses 95% compliance  We discussed: Current pharmacy is preferred with insurance plan and patient is satisfied with pharmacy services Patient decided to: Continue current medication management strategy  Care Plan and Follow Up Patient Decision:  Patient agrees to Care Plan and Follow-up.  Plan: Telephone follow up appointment with care management team member scheduled for:  3 months  Jeni Salles, PharmD,  [facility] [place] [facility] [place] [phone]

## 2020-11-18 ENCOUNTER — Encounter: Payer: Self-pay | Admitting: Gastroenterology

## 2020-11-23 ENCOUNTER — Telehealth (INDEPENDENT_AMBULATORY_CARE_PROVIDER_SITE_OTHER): Payer: PPO | Admitting: Family Medicine

## 2020-11-23 ENCOUNTER — Encounter: Payer: Self-pay | Admitting: Family Medicine

## 2020-11-23 DIAGNOSIS — M5442 Lumbago with sciatica, left side: Secondary | ICD-10-CM

## 2020-11-23 DIAGNOSIS — M5441 Lumbago with sciatica, right side: Secondary | ICD-10-CM

## 2020-11-23 DIAGNOSIS — G8929 Other chronic pain: Secondary | ICD-10-CM

## 2020-11-23 DIAGNOSIS — F1190 Opioid use, unspecified, uncomplicated: Secondary | ICD-10-CM | POA: Diagnosis not present

## 2020-11-23 MED ORDER — HYDROCODONE-ACETAMINOPHEN 10-325 MG PO TABS: 1.0000 | ORAL_TABLET | Freq: Four times a day (QID) | ORAL | 0 refills | Status: AC | PRN

## 2020-11-23 MED ORDER — HYDROCODONE-ACETAMINOPHEN 10-325 MG PO TABS: 1.0000 | ORAL_TABLET | Freq: Four times a day (QID) | ORAL | 0 refills | Status: DC | PRN

## 2020-11-23 NOTE — Progress Notes (Signed)
   Subjective:    Patient ID: [name], female    DOB: 10-02-1940, 80 y.o.   MRN: [id number]  HPI Virtual Visit via Telephone Note  I connected with the patient on 11/23/20 at  2:00 PM EDT by telephone and verified that I am speaking with the correct person using two identifiers.   I discussed the limitations, risks, security and privacy concerns of performing an evaluation and management service by telephone and the availability of in person appointments. I also discussed with the patient that there may be a patient responsible charge related to this service. The patient expressed understanding and agreed to proceed.  Location patient: home Location provider: work or home office Participants present for the call: patient, provider Patient did not have a visit in the prior 7 days to address this/these issue(s).   History of Present Illness: Here for pain management, she is doing well.    Observations/Objective: Patient sounds cheerful and well on the phone. I do not appreciate any SOB. Speech and thought processing are grossly intact. Patient reported vitals:  Assessment and Plan: Pain management.  Indication for chronic opioid: low back pain Medication and dose: Norco 10-325 # pills per month: 120 Last UDS date: 01-13-20 Opioid Treatment Agreement signed (Y/N): 10-28-18 Opioid Treatment Agreement last reviewed with patient:  11-23-20 [facility] reviewed this encounter (include red flags): Yes Meds were refilled.  Alysia Penna, MD   Follow Up Instructions:     [phone] 5-10 [phone] 11-20 [phone] 21-30 I did not refer this patient for an OV in the next 24 hours for this/these issue(s).  I discussed the assessment and treatment plan with the patient. The patient was provided an opportunity to ask questions and all were answered. The patient agreed with the plan and demonstrated an understanding of the instructions.   The patient was advised to call back or seek an in-person  evaluation if the symptoms worsen or if the condition fails to improve as anticipated.  I provided 12 minutes of non-face-to-face time during this encounter.   Alysia Penna, MD     Review of Systems     Objective:   Physical Exam        Assessment & Plan:

## 2020-11-25 NOTE — Telephone Encounter (Signed)
Inbound call from patient stating she is still experiencing diarrhea.  Please advise.

## 2020-11-28 MED ORDER — DIPHENOXYLATE-ATROPINE 2.5-0.025 MG PO TABS: ORAL_TABLET | ORAL | 2 refills | Status: DC

## 2020-11-28 NOTE — Telephone Encounter (Signed)
Pt called to inform that [facility] and was told that no [facility] was received for Lomotil. Pt was in tears over the phone and is requesting to pls help her to get this medicine asap.

## 2020-11-28 NOTE — Telephone Encounter (Signed)
Inbound call from patient. [facility] [address]/

## 2020-11-28 NOTE — Telephone Encounter (Signed)
Prescription for Lomotil faxed to patient's pharmacy. Patient informed.

## 2020-11-28 NOTE — Telephone Encounter (Signed)
[facility]

## 2020-11-28 NOTE — Addendum Note (Signed)
Addended by: Dorisann Frames L on: 11/28/2020 09:03 AM   Modules accepted: Orders

## 2020-11-28 NOTE — Telephone Encounter (Signed)
Informed patient that I have faxed it multiple times but will contact the pharmacy. Called pharmacy and they said it has been received and they will work on filling the medication. Informed patient she can now pick the prescription.

## 2020-11-28 NOTE — Addendum Note (Signed)
Addended by: Dorisann Frames L on: 11/28/2020 11:13 AM   Modules accepted: Orders

## 2020-11-28 NOTE — Telephone Encounter (Signed)
Please be sure she is taking: Zenpep 20,000U lipase 2 po with meals and 1 with snacks Lialda 2.4 g twice daily   Glycopyrrolate 1 mg p.o. twice daily Imodium 1-2 po tid prn diarrhea  If above not effective then OK to refill Lomotil 2.5 - 0.025 mg 1 po bid, #60, 2 refills

## 2020-11-28 NOTE — Telephone Encounter (Signed)
Patient states she is still experiencing loose stools even with below recommendations. Patient has an appt scheduled on 12/19/20. Dr. Fuller Plan, can we refill Lomotil?

## 2020-12-01 DIAGNOSIS — M19012 Primary osteoarthritis, left shoulder: Secondary | ICD-10-CM | POA: Diagnosis not present

## 2020-12-01 DIAGNOSIS — M47812 Spondylosis without myelopathy or radiculopathy, cervical region: Secondary | ICD-10-CM | POA: Diagnosis not present

## 2020-12-02 ENCOUNTER — Telehealth: Payer: Self-pay

## 2020-12-02 ENCOUNTER — Other Ambulatory Visit: Payer: Self-pay

## 2020-12-02 MED ORDER — METFORMIN HCL ER 750 MG PO TB24: 750.0000 mg | ORAL_TABLET | Freq: Every day | ORAL | 3 refills | Status: DC

## 2020-12-02 NOTE — Telephone Encounter (Signed)
Pt Rx for Metformin was changed to Metformin XR 750 mg daily by Dr Sarajane Jews. Pt was notified and verbalized understanding

## 2020-12-02 NOTE — Telephone Encounter (Signed)
-----  Message from [name], MD sent at 12/02/2020 10:18 AM EDT ----- Regarding: RE: Metformin to XR version Please cancel the Metformin and call in Metformin XR 1000 mg once daily, #90 with 3 rf  ----- Message ----- From: Viona Gilmore, [facility] Sent: 11/30/2020   9:54 PM EDT To: [name], MD Subject: Metformin to XR version                        Hi,  Upon speaking with Ms. Frederico Hamman, she noted a lot of GI discomfort. I do think this is an ongoing thing but what do you think about switching her to the XR version of metformin to see if that helps some? I don't think it will solve her problem completely but may minimize the potential for metformin to contribute.  Let me know!  Thanks, Maddie

## 2020-12-12 DIAGNOSIS — M5416 Radiculopathy, lumbar region: Secondary | ICD-10-CM | POA: Diagnosis not present

## 2020-12-19 ENCOUNTER — Encounter: Payer: Self-pay | Admitting: Gastroenterology

## 2020-12-19 ENCOUNTER — Ambulatory Visit (INDEPENDENT_AMBULATORY_CARE_PROVIDER_SITE_OTHER): Payer: PPO | Admitting: Gastroenterology

## 2020-12-19 VITALS — BP 130/70 | HR 74 | Ht 60.0 in | Wt 198.4 lb

## 2020-12-19 DIAGNOSIS — K580 Irritable bowel syndrome with diarrhea: Secondary | ICD-10-CM | POA: Diagnosis not present

## 2020-12-19 DIAGNOSIS — K8689 Other specified diseases of pancreas: Secondary | ICD-10-CM

## 2020-12-19 DIAGNOSIS — K5150 Left sided colitis without complications: Secondary | ICD-10-CM

## 2020-12-19 DIAGNOSIS — K219 Gastro-esophageal reflux disease without esophagitis: Secondary | ICD-10-CM

## 2020-12-19 MED ORDER — ZENPEP 20000-63000 UNITS PO CPEP: ORAL_CAPSULE | ORAL | 11 refills | Status: DC

## 2020-12-19 NOTE — Patient Instructions (Signed)
Increase your Zenpep to take 3 capsules by mouth before meals and 2 capsules before snacks.   Thank you for choosing me and [facility].  Pricilla Riffle. Dagoberto Ligas., MD., Marval Regal

## 2020-12-19 NOTE — Progress Notes (Signed)
    History of Present Illness: This is a 80 year old female returning for follow-up of pancreatic insufficiency, left-sided ulcerative colitis, IBS-D, GERD and persistent diarrhea.  Her diarrhea has improved after reducing her metformin 750 mg daily and decreasing Mg oxide to 400 mg daily.  Glycopyrrolate has also reduced her diarrhea.  She still requires Lomotil as needed.  Unfortunately her diarrhea persists and she occasionally has episodes of incontinence.  Her reflux symptoms are under good control.  Her weight is stable.  Appetite is good.  Current Medications, Allergies, Past Medical History, Past Surgical History, Family History and Social History were reviewed in [employer] record.   Physical Exam: General: Well developed, well nourished, no acute distress Head: Normocephalic and atraumatic Eyes: Sclerae anicteric, EOMI Ears: Normal auditory acuity Mouth: Not examined, mask on during Covid-19 pandemic Lungs: Clear throughout to auscultation Heart: Regular rate and rhythm; no murmurs, rubs or bruits Abdomen: Soft, non tender and non distended. No masses, hepatosplenomegaly or hernias noted. Normal Bowel sounds Rectal: Not done Musculoskeletal: Symmetrical with no gross deformities  Pulses:  Normal pulses noted Extremities: No clubbing, cyanosis, edema or deformities noted Neurological: Alert oriented x 4, grossly nonfocal Psychological:  Alert and cooperative. Normal mood and affect   Assessment and Recommendations:  Pancreatic insufficiency.  Maintain a carb modified, fat modified diet long-term.  Given ongoing difficulties with diarrhea will increase Zenpep 40,000 units to 3 before meals and 2 before snacks for 2 months and assess response.  If her diarrhea is not improved will resume 2 before meals and 1 before snacks. Left sided UC.  Continue Lialda 2.4 g p.o. twice daily.  Given age, comorbidities and no inflammation noted on last colonoscopy we do not  plan to proceed with future surveillance colonoscopies. Personal history of tubular adenomas and sessile serrated polyps. Given age and comorbidities we do not plan to proceed with future surveillance colonoscopies. History of H. pylori gastritis treated in December 2020.  Given difficulty controlling her reflux we did not obtain stool H. pylori antigen as it would be very difficult for her to discontinue PPIs for a couple weeks. IBS-D.  Continue glycopyrrolate 1 mg p.o. twice daily.  If diarrhea cannot be adequately controlled consider discontinuing metformin and mag oxide if possible.  Continue Lomotil 2.5-0.025 mg 1 twice daily as needed. Trial of a low FODMAP diet.  GERD.  Follow antireflux measures long term.  Continue pantoprazole 40 mg p.o. twice daily.  Zofran 4 mg p.o. 3 times daily as needed nausea, vomiting. History of IDA.  Hemoglobin 12.0 in March 2022.  She continues on iron once daily.  Defer management to PCP.

## 2020-12-24 ENCOUNTER — Other Ambulatory Visit: Payer: Self-pay | Admitting: Cardiology

## 2020-12-26 ENCOUNTER — Other Ambulatory Visit: Payer: Self-pay | Admitting: Family Medicine

## 2021-01-09 ENCOUNTER — Telehealth: Payer: Self-pay | Admitting: Pharmacist

## 2021-01-09 NOTE — Chronic Care Management (AMB) (Signed)
Chronic Care Management Pharmacy Assistant   Name: [name]  MRN: [id number] DOB: 1940-10-06   Reason for Encounter: Disease State General Assessment Call    Conditions to be addressed/monitored:  Diabetes  Recent office visits:  11-23-2020 [name] Morale, MD- Patient presented  via tele-health for Chronic narcotic use. Changed NORCO To 1 tablet every 6 hours PRN  Recent consult visits:  12-19-2020 [name] Artist, MD - Patient presented for Pancreatic insufficiency and other concerns. Increased Zenpep to TAKE 3 CAPSULES (40,000 UNITS) WITH MEALS AND 2 CAPSULE (20,000 UNITS) WITH A SNACK.   Hospital visits:  None in previous 6 months  Medications: Outpatient Encounter Medications as of 01/09/2021  Medication Sig   acetaminophen (TYLENOL) 500 MG tablet Take 500 mg by mouth every 6 (six) hours as needed for headache (pain).    albuterol (PROVENTIL) (2.5 MG/3ML) 0.083% nebulizer solution Take 3 mLs (2.5 mg total) by nebulization every 4 (four) hours as needed for wheezing or shortness of breath.   albuterol (VENTOLIN HFA) 108 (90 BASE) MCG/ACT inhaler Inhale 2 puffs into the lungs every 4 (four) hours as needed for wheezing or shortness of breath.   ALPRAZolam (XANAX) 0.5 MG tablet TAKE 1 TABLET (0.5 MG TOTAL) BY MOUTH 3 (THREE) TIMES DAILY AS NEEDED.   amLODipine (NORVASC) 10 MG tablet TAKE 1 TABLET BY MOUTH EVERY DAY   atenolol (TENORMIN) 50 MG tablet TAKE 1 TABLET BY MOUTH EVERY DAY   atorvastatin (LIPITOR) 20 MG tablet Take 1 tablet (20 mg total) by mouth daily.   azelastine (ASTELIN) 0.1 % nasal spray Place 1 spray into both nostrils at bedtime.   Blood Glucose Monitoring Suppl (ONETOUCH VERIO FLEX SYSTEM) w/Device KIT 1 each by Does not apply route daily at 12 noon.   budesonide-formoterol (SYMBICORT) 160-4.5 MCG/ACT inhaler Inhale 2 puffs into the lungs 2 (two) times daily.   Cholecalciferol (VITAMIN D-3) 5000 UNITS TABS Take 5,000 Units by mouth daily.   clopidogrel  (PLAVIX) 75 MG tablet Take 1 tablet (75 mg total) by mouth daily.   diclofenac sodium (VOLTAREN) 1 % GEL As needed for pain   diphenoxylate-atropine (LOMOTIL) 2.5-0.025 MG tablet TAKE 1 TABLET BY MOUTH 2 TIMES A DAY AS NEEDED FOR DIARRHEA OR LOOSE STOOLS.   Ferrous Sulfate (IRON) 325 (65 Fe) MG TABS Take 1 tablet (325 mg total) by mouth 2 (two) times daily.   fluticasone (FLONASE) 50 MCG/ACT nasal spray PLACE 2 SPRAYS IN EACH NOSTRIL AT BEDTIME   furosemide (LASIX) 40 MG tablet Take 1 tablet (40 mg total) by mouth 2 (two) times daily.   gabapentin (NEURONTIN) 600 MG tablet Take 1 tablet (600 mg total) by mouth 3 (three) times daily.   glimepiride (AMARYL) 1 MG tablet TAKE 1 TABLET BY MOUTH EVERY DAY BEFORE BREAKFAST   glucose blood (ONETOUCH VERIO) test strip Use to check blood glucose once daily   glycopyrrolate (ROBINUL) 1 MG tablet Take 1 tablet (1 mg total) by mouth 2 (two) times daily.   [START ON 01/28/2021] HYDROcodone-acetaminophen (NORCO) 10-325 MG tablet Take 1 tablet by mouth every 6 (six) hours as needed for moderate pain.   hydrOXYzine (VISTARIL) 25 MG capsule TAKE 1 CAPSULE (25 MG TOTAL) BY MOUTH EVERY 6 (SIX) HOURS AS NEEDED.   isosorbide mononitrate (IMDUR) 60 MG 24 hr tablet Take 1 tablet (60 mg total) by mouth daily. Please keep upcoming appt in November 2022 with Dr. Marlou [name] for future refills. Thank you   levocetirizine (XYZAL) 5 MG tablet  Take 5 mg by mouth every evening.   LIDODERM 5 % APPLY 1 PATCH TO SKIN FOR 12 HOURS THEN REMOVE AND DISCARD PATCH WITHIN 72 HOURS OR AS DIRECTED   loperamide (IMODIUM) 2 MG capsule Take 4-6 mg by mouth daily.   losartan (COZAAR) 50 MG tablet TAKE 1 TABLET BY MOUTH EVERY DAY   magnesium oxide (MAG-OX) 400 MG tablet Take 2 tablets (800 mg total) by mouth daily.   mesalamine (LIALDA) 1.2 g EC tablet Take 2 tablets (2.4 g total) by mouth 2 (two) times daily.   metFORMIN (GLUCOPHAGE XR) 750 MG 24 hr tablet Take 1 tablet (750 mg total) by mouth daily  with breakfast.   montelukast (SINGULAIR) 10 MG tablet Take one every morning   nabumetone (RELAFEN) 750 MG tablet TAKE 1 TABLET BY MOUTH TWICE A DAY WITH MEAL   neomycin-polymyxin-hydrocortisone (CORTISPORIN) OTIC solution Place 4 drops into both ears 4 (four) times daily.   nitroGLYCERIN (NITROSTAT) 0.4 MG SL tablet PLACE 1 TABLET (0.4 MG TOTAL) UNDER THE TONGUE EVERY 5 (FIVE) MINUTES AS NEEDED FOR CHEST PAIN.   Olopatadine HCl 0.7 % SOLN Place 1 drop into both eyes daily as needed (itching/allergies).   ondansetron (ZOFRAN-ODT) 4 MG disintegrating tablet TAKE 1 TABLET BY MOUTH EVERY 8 HOURS AS NEEDED FOR NAUSEA AND VOMITING   OneTouch Delica Lancets 96P MISC 1 each by Does not apply route daily at 12 noon.   Pancrelipase, Lip-Prot-Amyl, (ZENPEP) 20000-63000 units CPEP TAKE 3 CAPSULES (40,000 UNITS) WITH MEALS AND 2 CAPSULE (20,000 UNITS) WITH A SNACK   pantoprazole (PROTONIX) 40 MG tablet Take 1 tablet (40 mg total) by mouth 2 (two) times daily.   Polyethyl Glycol-Propyl Glycol (SYSTANE OP) Place 1 drop into both eyes daily. For dry eyes   potassium chloride SA (KLOR-CON M20) 20 MEQ tablet Take 1 tablet (20 mEq total) by mouth 3 (three) times daily.   tiZANidine (ZANAFLEX) 4 MG tablet TAKE 1 TABLET BY MOUTH TWICE A DAY   Facility-Administered Encounter Medications as of 01/09/2021  Medication   ipratropium-albuterol (DUONEB) 0.5-2.5 (3) MG/3ML nebulizer solution 3 mL    Recent Relevant Labs: Lab Results  Component Value Date/Time   HGBA1C 7.7 (H) 07/18/2020 11:43 AM   HGBA1C 6.6 (H) 09/08/2019 11:34 AM    Kidney Function Lab Results  Component Value Date/Time   CREATININE 0.60 07/18/2020 11:43 AM   CREATININE 0.59 09/08/2019 11:34 AM   GFR 86.24 07/18/2020 11:43 AM   GFRNONAA 89 09/08/2019 11:34 AM   GFRAA 102 09/08/2019 11:34 AM    Current antihyperglycemic regimen:  Glimepiride 1 mg 1 tablet daily before breakfast Metformin 750 mg 1 tablet once daily (XR) What recent  interventions/DTPs have been made to improve glycemic control:  Patient reports she did switch to the XR of her Metformin  Have there been any recent hospitalizations or ED visits since last visit with CPP? None Patient denies hypoglycemic symptoms, including Pale, Sweaty, Shaky, Hungry, Nervous/irritable, and Vision changes Patient denies hyperglycemic symptoms, including blurry vision, excessive thirst, fatigue, polyuria, and weakness How often are you checking your blood sugar? once daily What are your blood sugars ranging?  Fasting: 105; 120 During the week, how often does your blood glucose drop below 70? Never Are you checking your feet daily/regularly? Patient reports her feet are fine she sees a Podiatrist but has to make another appointment.  Adherence Review: Is the patient currently on a STATIN medication? Yes Is the patient currently on ACE/ARB medication? Yes Does the  patient have >5 day gap between last estimated fill dates? No   Notes: Patient reports she is doing fine, she reports she is tolerating the XR formulation well, has minimized GI side effects. She also reports she is taking the Protonix before and sometimes after meals and everything seems ok. She reports she does not eat much of anything and does not have a good appetite. Breakfast includes cereal for Lunch a sandwich and dinners varies. She does not eat out all the time. Offered to reschedule her follow up appointment with Jeni Salles on 10/27 as Watt Climes will be at a different office on thursdays and patient declined a this time.  Care Gaps: Hepatitis C Screening - Overdue TDAP - Overdue Zoster Vaccine - Overdue Foot Exam - Overdue Eye Exam - Overdue COVID Booster #3(Pfizer) - Overdue Colonoscopy - Overdue CCM - Declined reschedule AWV - 05-12-21  Star Rating Drugs: Metformin (Glucophage) 750 mg - [facility]  Atorvastatin (Lipitor) 20 mg - [facility] Glimepiride ( Amaryl)  1 mg - [facility]  Losartan (Cozaar) 50 mg - Last filled 12-09-2020 90 DS at [place] [place] Pharmacist Assistant [phone]

## 2021-01-11 ENCOUNTER — Other Ambulatory Visit: Payer: Self-pay

## 2021-01-11 MED ORDER — ONDANSETRON 4 MG PO TBDP: ORAL_TABLET | ORAL | 1 refills | Status: DC

## 2021-01-17 DIAGNOSIS — M48062 Spinal stenosis, lumbar region with neurogenic claudication: Secondary | ICD-10-CM | POA: Diagnosis not present

## 2021-01-17 DIAGNOSIS — M5416 Radiculopathy, lumbar region: Secondary | ICD-10-CM | POA: Diagnosis not present

## 2021-01-26 ENCOUNTER — Telehealth: Payer: Self-pay | Admitting: Family Medicine

## 2021-01-26 MED ORDER — METHYLPREDNISOLONE 4 MG PO TBPK: ORAL_TABLET | ORAL | 0 refills | Status: DC

## 2021-01-26 NOTE — Telephone Encounter (Signed)
Patient called because last week her legs gave out and she dropped to the ground and landed on her bottom. Patient is sore from the drop and would like some prednisone to help with soreness and inflammation.    [facility]  [id number] [address] Phone:  [phone] Fax:  [phone]     Good callback number is [phone]    Please Advise

## 2021-01-26 NOTE — Telephone Encounter (Signed)
I sent in a Medrol dose pack  

## 2021-01-26 NOTE — Telephone Encounter (Signed)
Spoke with pt verbalized understanding to pick up Rx for Medro pack from her pharmacy

## 2021-01-26 NOTE — Telephone Encounter (Signed)
Please advise 

## 2021-01-27 DIAGNOSIS — M25512 Pain in left shoulder: Secondary | ICD-10-CM | POA: Diagnosis not present

## 2021-02-02 DIAGNOSIS — M545 Low back pain, unspecified: Secondary | ICD-10-CM | POA: Diagnosis not present

## 2021-02-03 DIAGNOSIS — M25512 Pain in left shoulder: Secondary | ICD-10-CM | POA: Diagnosis not present

## 2021-02-06 ENCOUNTER — Other Ambulatory Visit: Payer: Self-pay | Admitting: Gastroenterology

## 2021-02-06 ENCOUNTER — Other Ambulatory Visit: Payer: Self-pay | Admitting: Cardiology

## 2021-02-09 ENCOUNTER — Telehealth: Payer: Self-pay | Admitting: *Deleted

## 2021-02-09 DIAGNOSIS — M48062 Spinal stenosis, lumbar region with neurogenic claudication: Secondary | ICD-10-CM | POA: Diagnosis not present

## 2021-02-09 NOTE — Telephone Encounter (Signed)
Patient has an appointment scheduled to see Dr Marlou Porch on 03/13/21 at 10:40AM. I spoke with her and offered her an appointment for tomorrow but she cannot come in tomorrow as she has an appointment scheduled elsewhere. She will wait to be cleared when she comes in to be seen in November. Will route to requesting surgeons office to make them aware.

## 2021-02-09 NOTE — Telephone Encounter (Signed)
   [facility] Pre-operative Risk Assessment    Patient Name: Doneisha Ivey  DOB: December 18, 1940 MRN: [id number]  HEARTCARE STAFF:  - IMPORTANT!!!!!! Under Visit Info/Reason for Call, type in Other and utilize the format Clearance MM/DD/YY or Clearance TBD. Do not use dashes or single digits. - Please review there is not already an duplicate clearance open for this procedure. - If request is for dental extraction, please clarify the # of teeth to be extracted. - If the patient is currently at the dentist's office, call Pre-Op Callback Staff (MA/nurse) to input urgent request.  - If the patient is not currently in the dentist office, please route to the Pre-Op pool.  Request for surgical clearance:  What type of surgery is being performed? Lumbar epidural interlaminar steroid injection  When is this surgery scheduled? TBD  What type of clearance is required (medical clearance vs. Pharmacy clearance to hold med vs. Both)? Both  Are there any medications that need to be held prior to surgery and how long? Plavix for 7 days prior  Practice name and name of physician performing surgery? [facility], Dr Laroy Apple  What is the office phone number? [phone]   7.   What is the office fax number? [phone] Attn:X-ray  8.   Anesthesia type (None, local, MAC, general) ? None listed   Juventino Slovak 02/09/2021, 2:32 PM  _________________________________________________________________   (provider comments below)

## 2021-02-09 NOTE — Telephone Encounter (Signed)
Primary Cardiologist:Mark Marlou Porch, MD  Chart reviewed as part of pre-operative protocol coverage. Because of [name]'s past medical history and time since last visit, he/she will require a follow-up visit in order to better assess preoperative cardiovascular risk.  Pre-op covering staff: - Please schedule appointment and call patient to inform them. - Please contact requesting surgeon's office via preferred method (i.e, phone, fax) to inform them of need for appointment prior to surgery.  If applicable, this message will also be routed to pharmacy pool and/or primary cardiologist for input on holding anticoagulant/antiplatelet agent as requested below so that this information is available at time of patient's appointment.   Deberah Pelton, NP  02/09/2021, 2:41 PM

## 2021-02-12 ENCOUNTER — Emergency Department (HOSPITAL_COMMUNITY)
Admission: EM | Admit: 2021-02-12 | Discharge: 2021-02-12 | Disposition: A | Discharge status: Home / Self Care | Payer: PPO | Attending: Emergency Medicine | Admitting: Emergency Medicine

## 2021-02-12 ENCOUNTER — Emergency Department (HOSPITAL_COMMUNITY): Payer: PPO

## 2021-02-12 ENCOUNTER — Other Ambulatory Visit: Payer: Self-pay

## 2021-02-12 DIAGNOSIS — I2510 Atherosclerotic heart disease of native coronary artery without angina pectoris: Secondary | ICD-10-CM | POA: Insufficient documentation

## 2021-02-12 DIAGNOSIS — J0110 Acute frontal sinusitis, unspecified: Secondary | ICD-10-CM

## 2021-02-12 DIAGNOSIS — R739 Hyperglycemia, unspecified: Secondary | ICD-10-CM

## 2021-02-12 DIAGNOSIS — R404 Transient alteration of awareness: Secondary | ICD-10-CM

## 2021-02-12 DIAGNOSIS — G4489 Other headache syndrome: Secondary | ICD-10-CM | POA: Diagnosis not present

## 2021-02-12 DIAGNOSIS — Z7984 Long term (current) use of oral hypoglycemic drugs: Secondary | ICD-10-CM | POA: Insufficient documentation

## 2021-02-12 DIAGNOSIS — E1165 Type 2 diabetes mellitus with hyperglycemia: Secondary | ICD-10-CM | POA: Diagnosis not present

## 2021-02-12 DIAGNOSIS — I110 Hypertensive heart disease with heart failure: Secondary | ICD-10-CM | POA: Diagnosis not present

## 2021-02-12 DIAGNOSIS — Z79899 Other long term (current) drug therapy: Secondary | ICD-10-CM | POA: Diagnosis not present

## 2021-02-12 DIAGNOSIS — Z20822 Contact with and (suspected) exposure to covid-19: Secondary | ICD-10-CM | POA: Diagnosis not present

## 2021-02-12 DIAGNOSIS — Z87891 Personal history of nicotine dependence: Secondary | ICD-10-CM | POA: Insufficient documentation

## 2021-02-12 DIAGNOSIS — R519 Headache, unspecified: Secondary | ICD-10-CM | POA: Diagnosis not present

## 2021-02-12 DIAGNOSIS — R059 Cough, unspecified: Secondary | ICD-10-CM | POA: Diagnosis not present

## 2021-02-12 DIAGNOSIS — I5032 Chronic diastolic (congestive) heart failure: Secondary | ICD-10-CM | POA: Insufficient documentation

## 2021-02-12 DIAGNOSIS — I517 Cardiomegaly: Secondary | ICD-10-CM | POA: Diagnosis not present

## 2021-02-12 DIAGNOSIS — R4182 Altered mental status, unspecified: Secondary | ICD-10-CM | POA: Diagnosis not present

## 2021-02-12 LAB — COMPREHENSIVE METABOLIC PANEL
ALT: 14 U/L (ref 0–44)
AST: 15 U/L (ref 15–41)
Albumin: 3.6 g/dL (ref 3.5–5.0)
Alkaline Phosphatase: 61 U/L (ref 38–126)
Anion gap: 9 (ref 5–15)
BUN: 23 mg/dL (ref 8–23)
CO2: 24 mmol/L (ref 22–32)
Calcium: 9.2 mg/dL (ref 8.9–10.3)
Chloride: 100 mmol/L (ref 98–111)
Creatinine, Ser: 0.82 mg/dL (ref 0.44–1.00)
GFR, Estimated: >60 mL/min (ref >60)
Glucose, Bld: 517 mg/dL (ref 70–99)
Potassium: 5.3 mmol/L — ABNORMAL HIGH (ref 3.5–5.1)
Sodium: 133 mmol/L — ABNORMAL LOW (ref 135–145)
Total Bilirubin: 1.2 mg/dL (ref 0.3–1.2)
Total Protein: 6.7 g/dL (ref 6.5–8.1)

## 2021-02-12 LAB — CBC WITH DIFFERENTIAL/PLATELET
Abs Immature Granulocytes: 0.08 10*3/uL — ABNORMAL HIGH (ref 0.00–0.07)
Basophils Absolute: 0 10*3/uL (ref 0.0–0.1)
Basophils Relative: 0 %
Eosinophils Absolute: 0 10*3/uL (ref 0.0–0.5)
Eosinophils Relative: 0 %
HCT: 41.3 % (ref 36.0–46.0)
Hemoglobin: 13.5 g/dL (ref 12.0–15.0)
Immature Granulocytes: 1 %
Lymphocytes Relative: 8 %
Lymphs Abs: 1.4 10*3/uL (ref 0.7–4.0)
MCH: 30.2 pg (ref 26.0–34.0)
MCHC: 32.7 g/dL (ref 30.0–36.0)
MCV: 92.4 fL (ref 80.0–100.0)
Monocytes Absolute: 1.1 10*3/uL — ABNORMAL HIGH (ref 0.1–1.0)
Monocytes Relative: 7 %
Neutro Abs: 14.9 10*3/uL — ABNORMAL HIGH (ref 1.7–7.7)
Neutrophils Relative %: 84 %
Platelets: 456 10*3/uL — ABNORMAL HIGH (ref 150–400)
RBC: 4.47 MIL/uL (ref 3.87–5.11)
RDW: 13.1 % (ref 11.5–15.5)
WBC: 17.6 10*3/uL — ABNORMAL HIGH (ref 4.0–10.5)
nRBC: 0 % (ref 0.0–0.2)

## 2021-02-12 LAB — RESP PANEL BY RT-PCR (FLU A&B, COVID) ARPGX2
Influenza A by PCR: NEGATIVE
Influenza B by PCR: NEGATIVE
SARS Coronavirus 2 by RT PCR: NEGATIVE

## 2021-02-12 LAB — CBG MONITORING, ED
Glucose-Capillary: 447 mg/dL — ABNORMAL HIGH (ref 70–99)
Glucose-Capillary: 386 mg/dL — ABNORMAL HIGH (ref 70–99)

## 2021-02-12 MED ORDER — INSULIN ASPART 100 UNIT/ML IJ SOLN
15.0000 [IU] | Freq: Once | INTRAMUSCULAR | Status: AC
  Administered 2021-02-12: 15 [IU] via INTRAVENOUS

## 2021-02-12 MED ORDER — SODIUM CHLORIDE 0.9 % IV BOLUS
1000.0000 mL | Freq: Once | INTRAVENOUS | Status: AC
  Administered 2021-02-12: 1000 mL via INTRAVENOUS

## 2021-02-12 MED ORDER — AMOXICILLIN-POT CLAVULANATE 875-125 MG PO TABS: 1.0000 | ORAL_TABLET | Freq: Two times a day (BID) | ORAL | 0 refills | Status: DC

## 2021-02-12 NOTE — ED Provider Notes (Signed)
[facility] EMERGENCY DEPARTMENT Provider Note   CSN: [id number] Arrival date & time: 02/12/21  1013     History Chief Complaint  Patient presents with   Hearing Problem   Headache    [name] is a 80 y.o. female.  80 yo F with a chief complaints of sudden onset hearing loss.  Noted yesterday associated with a headache.  Patient with worsening this morning and so family brought her here to be evaluated.  But she is having trouble speaking as well.  Having a bit of a cough.  Difficult discussing with the patient that she is extremely hard of hearing level 5 caveat.   Headache Associated symptoms: congestion   Associated symptoms: no dizziness, no fever, no myalgias, no nausea and no vomiting       Past Medical History:  Diagnosis Date   Adenomatous colon polyp 02/2006   Allergy    Allergy, unspecified not elsewhere classified    Anemia    Anxiety    Arthritis    Asthma    Blood transfusion without reported diagnosis    Bronchiectasis    CAD (coronary artery disease)    BMS to the LAD, 2002; cath 12/07/11 patent LAD stent and mild nonobstructive disease, EF 65%; Medical management   Cataract    removed both eyes   CHF (congestive heart failure) (HCC)    Diastolic   Clotting disorder ([place])    in legs per pt    Diabetes mellitus    Diverticulosis of colon    DJD (degenerative joint disease)    Fibromyalgia    GERD (gastroesophageal reflux disease)    Hypercholesterolemia    Hypertension    Microscopic hematuria    Neoplasm of kidney    Obesity    Other diseases of lung, not elsewhere classified    Pinched vertebral nerve    Pulmonary nodule    Negative PET in 2010   Stress incontinence, female    Syncope    Ulcerative colitis, left sided ([place]) 2008   Hx of   UTI (lower urinary tract infection)    Vitamin D deficiency disease     Patient Active Problem List   Diagnosis Date Noted   Leg cramps 07/18/2020   H. pylori infection  05/12/2019   Low back pain 10/21/2018   Fibromyalgia 01/03/2017   Left sided colitis ([place]) 05/13/2015   Diarrhea 08/04/2013   Hyperlipidemia 12/07/2011   Hematuria 11/27/2011   Ulcerative colitis ([place]) 11/25/2011   Chronic diastolic CHF (congestive heart failure) ([place]) 11/25/2011   Lower extremity edema 11/25/2011   Nonischemic cardiomyopathy ([place]) 04/17/2011   CAD (coronary artery disease)    Bronchiectasis ([place])    Essential hypertension    Vitamin D deficiency 12/03/2009   Depression 08/10/2008   PULMONARY NODULE 07/15/2008   NEOPLASM, KIDNEY 03/16/2008   STRESS INCONTINENCE 03/16/2008   COLONIC POLYPS 07/19/2007   Diabetes mellitus without complication ([place]) 65/68/1275   OBESITY 07/19/2007   ANEMIA 07/19/2007   Asthma 07/19/2007   DIVERTICULOSIS OF COLON 07/19/2007   ANXIETY 03/19/2007   GERD 03/19/2007   Osteoarthritis 03/19/2007   FIBROMYALGIA 03/19/2007   Allergic state 03/19/2007    Past Surgical History:  Procedure Laterality Date   ABDOMINAL HYSTERECTOMY     CARDIAC CATHETERIZATION  11/2011   CATARACT EXTRACTION, BILATERAL  2020   CHOLECYSTECTOMY     COLONOSCOPY     COLONOSCOPY W/ BIOPSIES  11-12-12   per Dr. Fuller Plan, diverticulosis only, repeat in 5  yrs   CORONARY STENT PLACEMENT  2002   BMS to the LAD   ESOPHAGOGASTRODUODENOSCOPY  12/2008   HAND SURGERY  01/2006   Right Hand Surgery by Dr. Amedeo Plenty   LEFT HEART CATHETERIZATION WITH CORONARY ANGIOGRAM N/A 12/07/2011   Procedure: LEFT HEART CATHETERIZATION WITH CORONARY ANGIOGRAM;  Surgeon: Peter M Martinique, MD;  Location: [facility] CATH LAB;  Service: Cardiovascular;  Laterality: N/A;   POLYPECTOMY     UPPER GASTROINTESTINAL ENDOSCOPY       OB History   No obstetric history on file.     Family History  Problem Relation Age of Onset   Pneumonia Father    Heart attack Father    Colon polyps Father    Parkinsonism Mother    Diabetes Brother    Sarcoidosis Brother    Hypertension Sister    Diabetes Sister     Breast cancer Daughter    Colon cancer Neg Hx    Esophageal cancer Neg Hx    Rectal cancer Neg Hx    Stomach cancer Neg Hx     Social History   Tobacco Use   Smoking status: Former    Packs/day: 2.00    Years: 15.00    Pack years: 30.00    Types: Cigarettes    Quit date: 04/30/1973    Years since quitting: 47.8   Smokeless tobacco: Never   Tobacco comments:    ex-smoker: smoked for 10-15 years up to 2ppd, quit 1975.  Vaping Use   Vaping Use: Never used  Substance Use Topics   Alcohol use: Not Currently    Alcohol/week: 0.0 standard drinks    Comment: none now    Drug use: No    Home Medications Prior to Admission medications   Medication Sig Start Date End Date Taking? Authorizing Provider  acetaminophen (TYLENOL) 500 MG tablet Take 500 mg by mouth every 6 (six) hours as needed for headache (pain).     [provider]  albuterol (PROVENTIL) (2.5 MG/3ML) 0.083% nebulizer solution Take 3 mLs (2.5 mg total) by nebulization every 4 (four) hours as needed for wheezing or shortness of breath. 02/12/17   [name], MD  albuterol (VENTOLIN HFA) 108 (90 BASE) MCG/ACT inhaler Inhale 2 puffs into the lungs every 4 (four) hours as needed for wheezing or shortness of breath. 05/25/14   [name], MD  ALPRAZolam Duanne Moron) 0.5 MG tablet TAKE 1 TABLET (0.5 MG TOTAL) BY MOUTH 3 (THREE) TIMES DAILY AS NEEDED. 07/20/20   [name], MD  amLODipine (NORVASC) 10 MG tablet TAKE 1 TABLET BY MOUTH EVERY DAY 05/12/20   Jerline Pain, MD  atenolol (TENORMIN) 50 MG tablet TAKE 1 TABLET BY MOUTH EVERY DAY 11/04/20   Jerline Pain, MD  atorvastatin (LIPITOR) 20 MG tablet Take 1 tablet (20 mg total) by mouth daily. Please keep upcoming appt for future refills. 02/06/21   Jerline Pain, MD  azelastine (ASTELIN) 0.1 % nasal spray Place 1 spray into both nostrils at bedtime. 12/08/16   [provider]  Blood Glucose Monitoring Suppl ([place]) w/Device KIT 1 each by  Does not apply route daily at 12 noon. 07/14/20   [name], MD  budesonide-formoterol [facility]) 160-4.5 MCG/ACT inhaler Inhale 2 puffs into the lungs 2 (two) times daily.    [provider]  Cholecalciferol (VITAMIN D-3) 5000 UNITS TABS Take 5,000 Units by mouth daily.    [provider]  clopidogrel (PLAVIX) 75 MG  tablet Take 1 tablet (75 mg total) by mouth daily. Please keep upcoming appt for future refills. 02/06/21   Jerline Pain, MD  diclofenac sodium (VOLTAREN) 1 % GEL As needed for pain 06/18/18   [provider]  diphenoxylate-atropine (LOMOTIL) 2.5-0.025 MG tablet TAKE 1 TABLET BY MOUTH 2 TIMES A DAY AS NEEDED FOR DIARRHEA OR LOOSE STOOLS. 11/28/20   Ladene Artist, MD  Ferrous Sulfate (IRON) 325 (65 Fe) MG TABS Take 1 tablet (325 mg total) by mouth 2 (two) times daily. 02/27/19   Ladene Artist, MD  fluticasone [facility]) 50 MCG/ACT nasal spray PLACE 2 SPRAYS IN EACH NOSTRIL AT BEDTIME 07/26/15   [name], MD  furosemide (LASIX) 40 MG tablet Take 1 tablet (40 mg total) by mouth 2 (two) times daily. 07/18/20   [name], MD  gabapentin (NEURONTIN) 600 MG tablet Take 1 tablet (600 mg total) by mouth 3 (three) times daily. 01/14/20   [name], MD  glimepiride (AMARYL) 1 MG tablet TAKE 1 TABLET BY MOUTH EVERY DAY BEFORE BREAKFAST 08/04/20   [name], MD  glucose blood [facility] VERIO) test strip Use to check blood glucose once daily 07/14/20   [name], MD  glycopyrrolate (ROBINUL) 1 MG tablet Take 1 tablet (1 mg total) by mouth 2 (two) times daily. 11/11/20   Ladene Artist, MD  HYDROcodone-acetaminophen (NORCO) 10-325 MG tablet Take 1 tablet by mouth every 6 (six) hours as needed for moderate pain. 01/28/21 02/27/21  [name], MD  hydrOXYzine (VISTARIL) 25 MG capsule TAKE 1 CAPSULE (25 MG TOTAL) BY MOUTH EVERY 6 (SIX) HOURS AS NEEDED. 07/18/20   [name], MD  isosorbide mononitrate (IMDUR) 60 MG 24 hr tablet Take 1 tablet  (60 mg total) by mouth daily. Please keep upcoming appt for future refills. 02/06/21   Jerline Pain, MD  levocetirizine (XYZAL) 5 MG tablet Take 5 mg by mouth every evening.    [provider]  LIDODERM 5 % APPLY 1 PATCH TO SKIN FOR 12 HOURS THEN REMOVE AND DISCARD PATCH WITHIN 72 HOURS OR AS DIRECTED 03/10/13   Noralee Space, MD  loperamide (IMODIUM) 2 MG capsule Take 4-6 mg by mouth daily.    [provider]  losartan (COZAAR) 50 MG tablet TAKE 1 TABLET BY MOUTH EVERY DAY 06/08/20   Jerline Pain, MD  magnesium oxide (MAG-OX) 400 MG tablet Take 2 tablets (800 mg total) by mouth daily. 07/18/20   [name], MD  mesalamine (LIALDA) 1.2 g EC tablet TAKE 2 TABLETS (2.4 G TOTAL) BY MOUTH 2 (TWO) TIMES DAILY. 02/06/21   Ladene Artist, MD  metFORMIN (GLUCOPHAGE XR) 750 MG 24 hr tablet Take 1 tablet (750 mg total) by mouth daily with breakfast. 12/02/20   [name], MD  methylPREDNISolone (MEDROL DOSEPAK) 4 MG TBPK tablet As directed 01/26/21   [name], MD  montelukast (SINGULAIR) 10 MG tablet Take one every morning 10/28/18   [name], MD  nabumetone (RELAFEN) 750 MG tablet TAKE 1 TABLET BY MOUTH TWICE A DAY WITH MEAL 10/13/20   [name], MD  neomycin-polymyxin-hydrocortisone (CORTISPORIN) OTIC solution Place 4 drops into both ears 4 (four) times daily. 05/25/20   [name], MD  nitroGLYCERIN (NITROSTAT) 0.4 MG SL tablet PLACE 1 TABLET (0.4 MG TOTAL) UNDER THE TONGUE EVERY 5 (FIVE) MINUTES AS NEEDED FOR CHEST PAIN. 11/11/19   Jerline Pain, MD  Olopatadine HCl 0.7 % SOLN Place 1 drop into both eyes daily as needed (itching/allergies).    [provider]  ondansetron (ZOFRAN-ODT) 4 MG disintegrating tablet TAKE 1 TABLET BY MOUTH EVERY 8 HOURS AS NEEDED FOR NAUSEA AND VOMITING 01/11/21   Ladene Artist, MD  OneTouch Delica Lancets 44I MISC 1 each by Does not apply route daily at 12 noon. 07/14/20   [name], MD  Pancrelipase, Lip-Prot-Amyl,  (ZENPEP) 20000-63000 units CPEP TAKE 3 CAPSULES (40,000 UNITS) WITH MEALS AND 2 CAPSULE (20,000 UNITS) WITH A SNACK 12/19/20   Ladene Artist, MD  pantoprazole (PROTONIX) 40 MG tablet Take 1 tablet (40 mg total) by mouth 2 (two) times daily. 11/14/20   Ladene Artist, MD  Polyethyl Glycol-Propyl Glycol (SYSTANE OP) Place 1 drop into both eyes daily. For dry eyes    [provider]  potassium chloride SA (KLOR-CON M20) 20 MEQ tablet Take 1 tablet (20 mEq total) by mouth 3 (three) times daily. Please keep upcoming appt for future refills. 02/06/21   Jerline Pain, MD  tiZANidine (ZANAFLEX) 4 MG tablet TAKE 1 TABLET BY MOUTH TWICE A DAY 06/28/20   [name], MD    Allergies    Doxycycline, Aspirin, Azithromycin, Caffeine, Ciprofloxacin, Codeine, Oxycodone, Sulfamethizole, and Tramadol hcl  Review of Systems   Review of Systems  Constitutional:  Negative for chills and fever.  HENT:  Positive for congestion, rhinorrhea and sinus pain.   Eyes:  Negative for redness and visual disturbance.  Respiratory:  Negative for shortness of breath and wheezing.   Cardiovascular:  Negative for chest pain and palpitations.  Gastrointestinal:  Negative for nausea and vomiting.  Genitourinary:  Negative for dysuria and urgency.  Musculoskeletal:  Negative for arthralgias and myalgias.  Skin:  Negative for pallor and wound.  Neurological:  Positive for headaches. Negative for dizziness.   Physical Exam Updated Vital Signs BP (!) 150/95   Pulse 94   Temp 98.5 F (36.9 C) (Oral)   Resp (!) 24   LMP  (LMP Unknown)   SpO2 92%   Physical Exam Vitals and nursing note reviewed.  Constitutional:      General: She is not in acute distress.    Appearance: She is well-developed. She is not diaphoretic.  HENT:     Head: Normocephalic and atraumatic.     Comments: Swollen turbinates, posterior nasal drip, Frontal sinus exquisitely tender to percussion, tm normal bilaterally.   Eyes:      Pupils: Pupils are equal, round, and reactive to light.  Cardiovascular:     Rate and Rhythm: Normal rate and regular rhythm.     Heart sounds: No murmur heard.   No friction rub. No gallop.  Pulmonary:     Effort: Pulmonary effort is normal.     Breath sounds: No wheezing or rales.  Abdominal:     General: There is no distension.     Palpations: Abdomen is soft.     Tenderness: There is no abdominal tenderness.  Musculoskeletal:        General: No tenderness.     Cervical back: Normal range of motion and neck supple.  Skin:    General: Skin is warm and dry.  Neurological:     Mental Status: She is alert.  Psychiatric:        Behavior: Behavior normal.    ED Results / Procedures / Treatments   Labs (all labs ordered are listed, but only abnormal results are  displayed) Labs Reviewed  CBC WITH DIFFERENTIAL/PLATELET - Abnormal; Notable for the following components:      Result Value   WBC 17.6 (*)    Platelets 456 (*)    Neutro Abs 14.9 (*)    Monocytes Absolute 1.1 (*)    Abs Immature Granulocytes 0.08 (*)    All other components within normal limits  COMPREHENSIVE METABOLIC PANEL - Abnormal; Notable for the following components:   Sodium 133 (*)    Potassium 5.3 (*)    Glucose, Bld 517 (*)    All other components within normal limits  CBG MONITORING, ED - Abnormal; Notable for the following components:   Glucose-Capillary 447 (*)    All other components within normal limits  CBG MONITORING, ED - Abnormal; Notable for the following components:   Glucose-Capillary 386 (*)    All other components within normal limits  RESP PANEL BY RT-PCR (FLU A&B, COVID) [id number]    EKG None  Radiology CT Head Wo Contrast  Result Date: 02/12/2021 CLINICAL DATA:  Hearing loss, sudden onset, bad headache. EXAM: CT HEAD WITHOUT CONTRAST TECHNIQUE: Contiguous axial images were obtained from the base of the skull through the vertex without intravenous contrast. COMPARISON:  Head CT dated  11/25/2011. FINDINGS: Brain: Mild generalized age related parenchymal volume loss with commensurate dilatation of the ventricles and sulci. Chronic small vessel ischemic changes within the bilateral periventricular and subcortical white matter regions. No hydrocephalus. No mass, hemorrhage, edema or other evidence of acute parenchymal abnormality. No extra-axial hemorrhage. Vascular: Chronic calcified atherosclerotic changes of the large vessels at the skull base. No unexpected hyperdense vessel. Skull: Normal. Negative for fracture or focal lesion. Sinuses/Orbits: No acute finding. Other: Questionable tiny colloid cyst at the upper margin of the third ventricle, foramen of Monro region, versus additional vascular calcification, of unlikely clinical significance. IMPRESSION: 1. No acute findings. No intracranial hemorrhage or edema. 2. Chronic small vessel ischemic changes in the white matter. Electronically Signed   By: Franki Cabot [name]   On: 02/12/2021 12:17   DG Chest Port 1 View  Result Date: 02/12/2021 CLINICAL DATA:  Cough. EXAM: PORTABLE CHEST 1 VIEW COMPARISON:  Chest x-rays dated 09/10/2017 and 12/16/2016. FINDINGS: Stable cardiomegaly. Continued central pulmonary vascular prominence and bibasilar interstitial prominence. Upper lungs are clear. No confluent opacity to suggest a consolidating pneumonia. No pleural effusion or pneumothorax is seen. No acute-appearing osseous abnormality. IMPRESSION: 1. Mild CHF/volume overload, likely chronic. 2. No evidence of pneumonia or pulmonary edema. Electronically Signed   By: Franki Cabot [name]   On: 02/12/2021 11:13    Procedures Procedures   Medications Ordered in ED Medications  sodium chloride 0.9 % bolus 1,000 mL (0 mLs Intravenous Stopped 02/12/21 1440)  insulin aspart (novoLOG) injection 15 Units (15 Units Intravenous Given 02/12/21 1455)    ED Course  I have reviewed the triage vital signs and the nursing notes.  Pertinent labs & imaging  results that were available during my care of the patient were reviewed by me and considered in my medical decision making (see chart for details).    MDM Rules/Calculators/A&P                           66 yoF with a chief complaint difficulty hearing.  This started yesterday coinciding with a headache.  Clinically the patient looks like she might have sinusitis.  There was some concern for stroke by the family and EMS.  No obvious  neurologic deficit though having some trouble communicating.  We will obtain a CT scan of the head.  Also was notably hyperglycemic with EMS blood work bolus of IV fluids reassess.  Family has arrived and provides further history.  Patient has been having some headaches off and on for a couple days now.  Worse in the frontal area also with congestion and cough.  She is recently been started on steroids for left shoulder pain.  I think this may be the cause of her hyperglycemia.  [name] mental status is improved significantly.  Chest x-ray viewed by me without focal infiltrate.  Will start on antibiotics for possible sinusitis.  She has pseudohyponatremia and hyperglycemia.  Blood sugars improved significantly with a liter of IV fluids.  We will discharge the patient.  Have her follow-up with her PCP.  We will have them stop the steroids as this also could cause acute confusion.  3:09 PM:  I have discussed the diagnosis/risks/treatment options with the patient and family and believe the pt to be eligible for discharge home to follow-up with PCP. We also discussed returning to the ED immediately if new or worsening sx occur. We discussed the sx which are most concerning (e.g., sudden worsening pain, fever, inability to tolerate by mouth ) that necessitate immediate return. Medications administered to the patient during their visit and any new prescriptions provided to the patient are listed below.  Medications given during this visit Medications  sodium chloride 0.9  % bolus 1,000 mL (0 mLs Intravenous Stopped 02/12/21 1440)  insulin aspart (novoLOG) injection 15 Units (15 Units Intravenous Given 02/12/21 1455)     The patient appears reasonably screen and/or stabilized for discharge and I doubt any other medical condition or other [facility] requiring further screening, evaluation, or treatment in the ED at this time prior to discharge.   Final Clinical Impression(s) / ED Diagnoses Final diagnoses:  Hyperglycemia  Transient alteration of awareness  Acute frontal sinusitis, recurrence not specified    Rx / DC Orders ED Discharge Orders     None        Deno Etienne, DO 02/12/21 1509

## 2021-02-12 NOTE — ED Notes (Signed)
Pt CBG 447. RN [place] notified

## 2021-02-12 NOTE — Discharge Instructions (Addendum)
Please stop the prednisone if he can, edema taking it longer than a week then discuss it with your family doctor prior to stopping.  I am to treat you with an antibiotic for possible sinus infection.  Please return for worsening headache fever worsening confusion.  Discuss with your PCP your blood sugar.

## 2021-02-12 NOTE — ED Triage Notes (Signed)
Pt BIB [facility] from home c/o a bad headache since last night and woke up not being able to hear this morning. [place] was 9pm.

## 2021-02-14 ENCOUNTER — Ambulatory Visit (INDEPENDENT_AMBULATORY_CARE_PROVIDER_SITE_OTHER): Payer: PPO | Admitting: Family Medicine

## 2021-02-14 ENCOUNTER — Encounter: Payer: Self-pay | Admitting: Family Medicine

## 2021-02-14 ENCOUNTER — Other Ambulatory Visit: Payer: Self-pay

## 2021-02-14 VITALS — BP 120/78 | HR 85 | Temp 98.5°F | Wt 204.0 lb

## 2021-02-14 DIAGNOSIS — J019 Acute sinusitis, unspecified: Secondary | ICD-10-CM

## 2021-02-14 DIAGNOSIS — E119 Type 2 diabetes mellitus without complications: Secondary | ICD-10-CM | POA: Diagnosis not present

## 2021-02-14 NOTE — Progress Notes (Signed)
   Subjective:    Patient ID: [name], female    DOB: 1940-09-10, 80 y.o.   MRN: [id number]  HPI Here with her son to follow up on an ED visit on 02-12-21. She presented with very high blood glucoses, a headache, and confusion. On arrival her glucose was 517. Other labs were unremarkable. Because of the headache she was given a head CT and this was clear. It was felt that she also had a sinus infection, so she was placed on a course of Augmentin. Of note, about 2 weeks ago her Orthopedist put two cortisone shots into her left shoulder. At the ED, she improved rapidly when she got IV fluids. Since going back home, her confusion has completely resolved, and the headache is gone. Her fasting glucose this morning was 455, and here in our clinic it is 308.    Review of Systems  Constitutional: Negative.   Respiratory: Negative.    Cardiovascular: Negative.   Gastrointestinal: Negative.   Genitourinary: Negative.       Objective:   Physical Exam Constitutional:      Appearance: Normal appearance.  Cardiovascular:     Rate and Rhythm: Normal rate and regular rhythm.     Pulses: Normal pulses.     Heart sounds: Normal heart sounds.  Pulmonary:     Effort: Pulmonary effort is normal.     Breath sounds: Normal breath sounds.  Lymphadenopathy:     Cervical: No cervical adenopathy.  Neurological:     General: No focal deficit present.     Mental Status: She is alert and oriented to person, place, and time.          Assessment & Plan:  She is having very high glucoses as the result of getting two steroid injections to the left shoulder. This should resolve over the next few weeks as the steroids wear off. Until then she will continue with Metformin XR 750 mg once each morning, and she will increase the Glimepiride to 1 mg TID. Once the glucoses settle down, she will go back down to taking one Glimepiride daily. She will also finish up the Augmentin for the sinusitis. We spent a total  of ( 32  ) minutes reviewing records and discussing these issues.   Alysia Penna, MD

## 2021-02-16 ENCOUNTER — Other Ambulatory Visit: Payer: Self-pay

## 2021-02-16 MED ORDER — AMLODIPINE BESYLATE 10 MG PO TABS: 10.0000 mg | ORAL_TABLET | Freq: Every day | ORAL | 2 refills | Status: DC

## 2021-02-18 ENCOUNTER — Other Ambulatory Visit: Payer: Self-pay | Admitting: Family Medicine

## 2021-02-21 ENCOUNTER — Other Ambulatory Visit: Payer: Self-pay

## 2021-02-21 ENCOUNTER — Telehealth: Payer: Self-pay

## 2021-02-21 MED ORDER — MAGNESIUM OXIDE 400 MG PO TABS: 2.0000 | ORAL_TABLET | Freq: Every day | ORAL | 0 refills | Status: DC

## 2021-02-21 NOTE — Telephone Encounter (Signed)
Called patient to make aware that requested refill has been sent to [facility] on [address].

## 2021-02-21 NOTE — Telephone Encounter (Signed)
Patient called requesting Rx refill  magnesium oxide (MAG-OX) 400 MG tablet Patient stated she has been out for a week and does not want to go back to hospital

## 2021-02-23 ENCOUNTER — Telehealth: Payer: PPO

## 2021-02-28 ENCOUNTER — Other Ambulatory Visit: Payer: Self-pay | Admitting: Gastroenterology

## 2021-03-02 ENCOUNTER — Telehealth: Payer: Self-pay | Admitting: Pharmacist

## 2021-03-02 NOTE — Chronic Care Management (AMB) (Signed)
Chronic Care Management Pharmacy Assistant   Name: [name]  MRN: [id number] DOB: February 13, 1941  Reason for Encounter: Disease State/ Diabetes Assessment Call   Conditions to be addressed/monitored: DMII  Recent office visits:  03/10/21 [name], MD - Patient presented for Chronic narcotic use and other concerns. Prescribed Hydrocodone- Acetaminophen 10-325 mg every 6 hours PRN  03/09/21 [name], MD - Patient presented for Diabetes mellitus without complication and other concerns.  Increased Glimepiride to  2 mg daily with breakfast  02/14/21 [name], MD - Patient presented for Diabetes mellitus without complication and other concerns. Stopped Methylprednisolone. Increased Glimepiride to 1 mg TID  Recent consult visits:  None  Hospital visits:  Medication Reconciliation was completed by comparing discharge summary, patient's EMR and Pharmacy list, and upon discussion with patient.  Patient presented to [facility] ED on 02/12/21 due to Hyperglycemia. She was there for 5 hours.  New?Medications Started at [facility] Discharge:?? -started  None  Medication Changes at Hospital Discharge: -Changed  None  Medications Discontinued at Hospital Discharge: -Stopped  None  Medications that remain the same after Hospital Discharge:??  -All other medications will remain the same.    Medications: Outpatient Encounter Medications as of 03/02/2021  Medication Sig   acetaminophen (TYLENOL) 500 MG tablet Take 500 mg by mouth every 6 (six) hours as needed for headache (pain).    albuterol (PROVENTIL) (2.5 MG/3ML) 0.083% nebulizer solution Take 3 mLs (2.5 mg total) by nebulization every 4 (four) hours as needed for wheezing or shortness of breath.   albuterol (VENTOLIN HFA) 108 (90 BASE) MCG/ACT inhaler Inhale 2 puffs into the lungs every 4 (four) hours as needed for wheezing or shortness of breath.   ALPRAZolam (XANAX) 0.5 MG tablet TAKE 1 TABLET  (0.5 MG TOTAL) BY MOUTH 3 (THREE) TIMES DAILY AS NEEDED.   amLODipine (NORVASC) 10 MG tablet Take 1 tablet (10 mg total) by mouth daily.   amoxicillin-clavulanate (AUGMENTIN) 875-125 MG tablet Take 1 tablet by mouth every 12 (twelve) hours.   atenolol (TENORMIN) 50 MG tablet TAKE 1 TABLET BY MOUTH EVERY DAY   atorvastatin (LIPITOR) 20 MG tablet Take 1 tablet (20 mg total) by mouth daily. Please keep upcoming appt for future refills.   azelastine (ASTELIN) 0.1 % nasal spray Place 1 spray into both nostrils at bedtime.   Blood Glucose Monitoring Suppl (ONETOUCH VERIO FLEX SYSTEM) w/Device KIT 1 each by Does not apply route daily at 12 noon.   budesonide-formoterol (SYMBICORT) 160-4.5 MCG/ACT inhaler Inhale 2 puffs into the lungs 2 (two) times daily.   Cholecalciferol (VITAMIN D-3) 5000 UNITS TABS Take 5,000 Units by mouth daily.   clopidogrel (PLAVIX) 75 MG tablet Take 1 tablet (75 mg total) by mouth daily. Please keep upcoming appt for future refills.   diclofenac sodium (VOLTAREN) 1 % GEL As needed for pain   diphenoxylate-atropine (LOMOTIL) 2.5-0.025 MG tablet TAKE 1 TABLET BY MOUTH TWICE DAILY AS NEEDED FOR DIARRHEA OR LOOSE STOOL   Ferrous Sulfate (IRON) 325 (65 Fe) MG TABS Take 1 tablet (325 mg total) by mouth 2 (two) times daily.   fluticasone (FLONASE) 50 MCG/ACT nasal spray PLACE 2 SPRAYS IN EACH NOSTRIL AT BEDTIME   furosemide (LASIX) 40 MG tablet Take 1 tablet (40 mg total) by mouth 2 (two) times daily.   gabapentin (NEURONTIN) 600 MG tablet Take 1 tablet (600 mg total) by mouth 3 (three) times daily.   glimepiride (AMARYL) 1 MG tablet TAKE  1 TABLET BY MOUTH EVERY DAY BEFORE BREAKFAST   glucose blood (ONETOUCH VERIO) test strip Use to check blood glucose once daily   glycopyrrolate (ROBINUL) 1 MG tablet Take 1 tablet (1 mg total) by mouth 2 (two) times daily.   hydrOXYzine (VISTARIL) 25 MG capsule TAKE 1 CAPSULE (25 MG TOTAL) BY MOUTH EVERY 6 (SIX) HOURS AS NEEDED.   isosorbide  mononitrate (IMDUR) 60 MG 24 hr tablet Take 1 tablet (60 mg total) by mouth daily. Please keep upcoming appt for future refills.   levocetirizine (XYZAL) 5 MG tablet Take 5 mg by mouth every evening.   LIDODERM 5 % APPLY 1 PATCH TO SKIN FOR 12 HOURS THEN REMOVE AND DISCARD PATCH WITHIN 72 HOURS OR AS DIRECTED   loperamide (IMODIUM) 2 MG capsule Take 4-6 mg by mouth daily.   losartan (COZAAR) 50 MG tablet TAKE 1 TABLET BY MOUTH EVERY DAY   magnesium oxide (MAG-OX) 400 MG tablet Take 2 tablets (800 mg total) by mouth daily.   mesalamine (LIALDA) 1.2 g EC tablet TAKE 2 TABLETS (2.4 G TOTAL) BY MOUTH 2 (TWO) TIMES DAILY.   metFORMIN (GLUCOPHAGE XR) 750 MG 24 hr tablet Take 1 tablet (750 mg total) by mouth daily with breakfast.   montelukast (SINGULAIR) 10 MG tablet Take one every morning   nabumetone (RELAFEN) 750 MG tablet TAKE 1 TABLET BY MOUTH TWICE A DAY WITH MEAL   neomycin-polymyxin-hydrocortisone (CORTISPORIN) OTIC solution Place 4 drops into both ears 4 (four) times daily.   nitroGLYCERIN (NITROSTAT) 0.4 MG SL tablet PLACE 1 TABLET (0.4 MG TOTAL) UNDER THE TONGUE EVERY 5 (FIVE) MINUTES AS NEEDED FOR CHEST PAIN.   Olopatadine HCl 0.7 % SOLN Place 1 drop into both eyes daily as needed (itching/allergies).   ondansetron (ZOFRAN-ODT) 4 MG disintegrating tablet TAKE 1 TABLET BY MOUTH EVERY 8 HOURS AS NEEDED FOR NAUSEA AND VOMITING   OneTouch Delica Lancets 81E MISC 1 each by Does not apply route daily at 12 noon.   Pancrelipase, Lip-Prot-Amyl, (ZENPEP) 20000-63000 units CPEP TAKE 3 CAPSULES (40,000 UNITS) WITH MEALS AND 2 CAPSULE (20,000 UNITS) WITH A SNACK   pantoprazole (PROTONIX) 40 MG tablet Take 1 tablet (40 mg total) by mouth 2 (two) times daily.   Polyethyl Glycol-Propyl Glycol (SYSTANE OP) Place 1 drop into both eyes daily. For dry eyes   potassium chloride SA (KLOR-CON M20) 20 MEQ tablet Take 1 tablet (20 mEq total) by mouth 3 (three) times daily. Please keep upcoming appt for future  refills.   tiZANidine (ZANAFLEX) 4 MG tablet TAKE 1 TABLET BY MOUTH TWICE A DAY   Facility-Administered Encounter Medications as of 03/02/2021  Medication   ipratropium-albuterol (DUONEB) 0.5-2.5 (3) MG/3ML nebulizer solution 3 mL  Recent Relevant Labs: Lab Results  Component Value Date/Time   HGBA1C 7.7 (H) 07/18/2020 11:43 AM   HGBA1C 6.6 (H) 09/08/2019 11:34 AM    Kidney Function Lab Results  Component Value Date/Time   CREATININE 0.82 02/12/2021 12:00 PM   CREATININE 0.60 07/18/2020 11:43 AM   GFR 86.24 07/18/2020 11:43 AM   GFRNONAA >60 02/12/2021 12:00 PM   GFRAA 102 09/08/2019 11:34 AM    Current antihyperglycemic regimen:  Glimepiride 1 mg 2 tablet daily  Metformin 1000 mg 1 tablet twice daily What recent interventions/DTPs have been made to improve glycemic control:  Patient reports her glimepiride was increased to 2 tabs a day Have there been any recent hospitalizations or ED visits since last visit with CPP? No Patient denies hypoglycemic symptoms, including  None Patient denies hyperglycemic symptoms, including none How often are you checking your blood sugar? Patient reports that she had just seen Dr Sarajane Jews she was unable to give me specific readings of her home checks What are your blood sugars ranging? Patient reports they are much better controlled During the week, how often does your blood glucose drop below 70? Never   Adherence Review: Is the patient currently on a STATIN medication? Yes Is the patient currently on ACE/ARB medication? Yes Does the patient have >5 day gap between last estimated fill dates? Yes  Glimepiride ( Amaryl)  1 mg - Last filled 10/31/2020 [facility]    Care Gaps: BP - 120/78 Hepatitis C Screening - Overdue TDAP - Overdue Zoster Vaccine - Overdue Foot Exam - Overdue Eye Exam - Overdue COVID Booster #3(Pfizer) - Overdue Colonoscopy - Overdue CCM - Declined at this time as she has just seen her PCP [facility] - 1/23 Lab Results   Component Value Date   HGBA1C 7.7 (H) 07/18/2020    Star Rating Drugs: Metformin (Glucophage) 750 mg - Last filled 02/28/2021 [facility] at [facility]  Atorvastatin (Lipitor) 20 mg - Last filled 02/15/2021 [facility] Glimepiride ( Amaryl)  1 mg - Last filled 10/31/2020 [facility]  Losartan (Cozaar) 50 mg - Last filled 12/09/2020 [facility]  Call to [facility] verified the above fill dates as accurate   03/02/21 Spoke to patient she reported now was not a good time she was not at home to call her back in a few days.  [place] Clinical Pharmacist Assistant [phone]

## 2021-03-03 ENCOUNTER — Other Ambulatory Visit: Payer: Self-pay | Admitting: Family Medicine

## 2021-03-07 ENCOUNTER — Other Ambulatory Visit: Payer: Self-pay

## 2021-03-07 DIAGNOSIS — I509 Heart failure, unspecified: Secondary | ICD-10-CM | POA: Diagnosis not present

## 2021-03-07 DIAGNOSIS — E119 Type 2 diabetes mellitus without complications: Secondary | ICD-10-CM | POA: Diagnosis not present

## 2021-03-08 ENCOUNTER — Ambulatory Visit: Payer: PPO | Admitting: Family Medicine

## 2021-03-09 ENCOUNTER — Encounter: Payer: Self-pay | Admitting: Family Medicine

## 2021-03-09 ENCOUNTER — Ambulatory Visit (INDEPENDENT_AMBULATORY_CARE_PROVIDER_SITE_OTHER): Payer: PPO | Admitting: Family Medicine

## 2021-03-09 VITALS — BP 120/76 | HR 78 | Temp 97.5°F | Wt 204.4 lb

## 2021-03-09 DIAGNOSIS — F1190 Opioid use, unspecified, uncomplicated: Secondary | ICD-10-CM

## 2021-03-09 DIAGNOSIS — R739 Hyperglycemia, unspecified: Secondary | ICD-10-CM

## 2021-03-09 DIAGNOSIS — E119 Type 2 diabetes mellitus without complications: Secondary | ICD-10-CM

## 2021-03-09 MED ORDER — GLIMEPIRIDE 1 MG PO TABS: 2.0000 mg | ORAL_TABLET | Freq: Every day | ORAL | 0 refills | Status: DC

## 2021-03-09 NOTE — Progress Notes (Signed)
   Subjective:    Patient ID: [name], female    DOB: [birth year], 80 y.o.   MRN: [id number]  HPI Here with her son to follow up on severe hyperglycemia. We saw her on 02-14-21 after she had been in the ER on 02-12-21 for spells of weakness and confusion due to severely high blood glucoses. She was having glucoses in the 500s for a day or two, and we determined this was caused by having 3 orthopedic steroid injections in the course of one week. She has been taking Metformin XR 750 mg one a day as well as Glimiperide 1 mg. At our last OV we told her to increase the Glimiperide to 3 tabs a day for one week and then to drop back to 1 tab a day. Her glucoses have come down nicely, though they are still a bit high in the 150s for am fasting levels. These will get up to 180 during the day. She feels better although her son says her cognition is still not quite back to baseline.    Review of Systems  Constitutional: Negative.   Respiratory: Negative.    Cardiovascular: Negative.   Neurological:  Negative for dizziness, tremors, speech difficulty, weakness, light-headedness and headaches.      Objective:   Physical Exam Constitutional:      Appearance: Normal appearance. She is not ill-appearing.     Comments: Using her walker   Cardiovascular:     Rate and Rhythm: Normal rate and regular rhythm.     Pulses: Normal pulses.     Heart sounds: Normal heart sounds.  Pulmonary:     Effort: Pulmonary effort is normal.     Breath sounds: Normal breath sounds.  Neurological:     General: No focal deficit present.     Mental Status: She is alert and oriented to person, place, and time. Mental status is at baseline.          Assessment & Plan:  Her diabetes has come under much better control, though the glucoses remain a bit high. She will increase the Glimiperide to 2 tabs a day, and we will check her back in 2 weeks.  Alysia Penna, MD

## 2021-03-10 ENCOUNTER — Telehealth (INDEPENDENT_AMBULATORY_CARE_PROVIDER_SITE_OTHER): Payer: PPO | Admitting: Family Medicine

## 2021-03-10 ENCOUNTER — Encounter: Payer: Self-pay | Admitting: Family Medicine

## 2021-03-10 DIAGNOSIS — G8929 Other chronic pain: Secondary | ICD-10-CM

## 2021-03-10 DIAGNOSIS — M5441 Lumbago with sciatica, right side: Secondary | ICD-10-CM

## 2021-03-10 DIAGNOSIS — R937 Abnormal findings on diagnostic imaging of other parts of musculoskeletal system: Secondary | ICD-10-CM

## 2021-03-10 DIAGNOSIS — F1190 Opioid use, unspecified, uncomplicated: Secondary | ICD-10-CM | POA: Diagnosis not present

## 2021-03-10 DIAGNOSIS — M5442 Lumbago with sciatica, left side: Secondary | ICD-10-CM

## 2021-03-10 MED ORDER — GABAPENTIN 600 MG PO TABS: 600.0000 mg | ORAL_TABLET | Freq: Three times a day (TID) | ORAL | 5 refills | Status: DC

## 2021-03-10 MED ORDER — HYDROCODONE-ACETAMINOPHEN 10-325 MG PO TABS: 1.0000 | ORAL_TABLET | Freq: Four times a day (QID) | ORAL | 0 refills | Status: DC | PRN

## 2021-03-10 MED ORDER — ALPRAZOLAM 0.5 MG PO TABS: 0.5000 mg | ORAL_TABLET | Freq: Three times a day (TID) | ORAL | 5 refills | Status: DC | PRN

## 2021-03-10 MED ORDER — HYDROCODONE-ACETAMINOPHEN 10-325 MG PO TABS
1.0000 | ORAL_TABLET | Freq: Four times a day (QID) | ORAL | 0 refills | Status: AC | PRN
Start: 2021-05-10 — End: 2021-06-09

## 2021-03-10 NOTE — Addendum Note (Signed)
Addended by: Amanda Cockayne on: 03/10/2021 10:07 AM   Modules accepted: Orders

## 2021-03-10 NOTE — Addendum Note (Signed)
Addended by: Alysia Penna A on: 03/10/2021 08:11 AM   Modules accepted: Orders

## 2021-03-10 NOTE — Progress Notes (Signed)
   Subjective:    Patient ID: [name], female    DOB: 01-Jul-1940, 80 y.o.   MRN: [id number]  HPI Virtual Visit via Telephone Note  I connected with the patient on 03/10/21 at  2:45 PM EST by telephone and verified that I am speaking with the correct person using two identifiers.   I discussed the limitations, risks, security and privacy concerns of performing an evaluation and management service by telephone and the availability of in person appointments. I also discussed with the patient that there may be a patient responsible charge related to this service. The patient expressed understanding and agreed to proceed.  Location patient: home Location provider: work or home office Participants present for the call: patient, provider Patient did not have a visit in the prior 7 days to address this/these issue(s).   History of Present Illness: Here for pain management, she is doing well.    Observations/Objective: Patient sounds cheerful and well on the phone. I do not appreciate any SOB. Speech and thought processing are grossly intact. Patient reported vitals:  Assessment and Plan: Pain management. Indication for chronic opioid: low back pain Medication and dose: Norco 10-325 # pills per month: 120 Last UDS date: 03-10-21 Opioid Treatment Agreement signed (Y/N): 10-28-18 Opioid Treatment Agreement last reviewed with patient:  03-10-21 [facility] reviewed this encounter (include red flags): Yes Meds were refilled.  Alysia Penna, MD   Follow Up Instructions:     [phone] 5-10 [phone] 11-20 [phone] 21-30 I did not refer this patient for an OV in the next 24 hours for this/these issue(s).  I discussed the assessment and treatment plan with the patient. The patient was provided an opportunity to ask questions and all were answered. The patient agreed with the plan and demonstrated an understanding of the instructions.   The patient was advised to call back or seek an in-person  evaluation if the symptoms worsen or if the condition fails to improve as anticipated.  I provided 13 minutes of non-face-to-face time during this encounter.   Alysia Penna, MD     Review of Systems     Objective:   Physical Exam        Assessment & Plan:

## 2021-03-13 ENCOUNTER — Ambulatory Visit (INDEPENDENT_AMBULATORY_CARE_PROVIDER_SITE_OTHER): Payer: PPO | Admitting: Cardiology

## 2021-03-13 ENCOUNTER — Encounter: Payer: Self-pay | Admitting: Cardiology

## 2021-03-13 ENCOUNTER — Other Ambulatory Visit: Payer: Self-pay

## 2021-03-13 DIAGNOSIS — Z01810 Encounter for preprocedural cardiovascular examination: Secondary | ICD-10-CM | POA: Insufficient documentation

## 2021-03-13 DIAGNOSIS — E7800 Pure hypercholesterolemia, unspecified: Secondary | ICD-10-CM | POA: Diagnosis not present

## 2021-03-13 DIAGNOSIS — R011 Cardiac murmur, unspecified: Secondary | ICD-10-CM | POA: Diagnosis not present

## 2021-03-13 DIAGNOSIS — I25119 Atherosclerotic heart disease of native coronary artery with unspecified angina pectoris: Secondary | ICD-10-CM

## 2021-03-13 DIAGNOSIS — R252 Cramp and spasm: Secondary | ICD-10-CM

## 2021-03-13 DIAGNOSIS — I10 Essential (primary) hypertension: Secondary | ICD-10-CM | POA: Diagnosis not present

## 2021-03-13 DIAGNOSIS — R600 Localized edema: Secondary | ICD-10-CM

## 2021-03-13 DIAGNOSIS — E119 Type 2 diabetes mellitus without complications: Secondary | ICD-10-CM | POA: Diagnosis not present

## 2021-03-13 LAB — DRUG MONITOR, PANEL 1, W/CONF, URINE
Alphahydroxyalprazolam: 746 ng/mL — ABNORMAL HIGH (ref <25)
Alphahydroxymidazolam: NEGATIVE ng/mL (ref <50)
Alphahydroxytriazolam: NEGATIVE ng/mL (ref <50)
Aminoclonazepam: NEGATIVE ng/mL (ref <25)
Amphetamines: NEGATIVE ng/mL (ref <500)
Barbiturates: NEGATIVE ng/mL (ref <300)
Benzodiazepines: POSITIVE ng/mL — AB (ref <100)
Cocaine Metabolite: NEGATIVE ng/mL (ref <150)
Codeine: NEGATIVE ng/mL (ref <50)
Creatinine: 219.6 mg/dL (ref >=20.0)
Hydrocodone: 281 ng/mL — ABNORMAL HIGH (ref <50)
Hydromorphone: 1135 ng/mL — ABNORMAL HIGH (ref <50)
Hydroxyethylflurazepam: NEGATIVE ng/mL (ref <50)
Lorazepam: NEGATIVE ng/mL (ref <50)
Marijuana Metabolite: NEGATIVE ng/mL (ref <20)
Methadone Metabolite: NEGATIVE ng/mL (ref <100)
Morphine: NEGATIVE ng/mL (ref <50)
Nordiazepam: NEGATIVE ng/mL (ref <50)
Norhydrocodone: 829 ng/mL — ABNORMAL HIGH (ref <50)
Opiates: POSITIVE ng/mL — AB (ref <100)
Oxazepam: NEGATIVE ng/mL (ref <50)
Oxidant: NEGATIVE ug/mL (ref <200)
Oxycodone: NEGATIVE ng/mL (ref <100)
Phencyclidine: NEGATIVE ng/mL (ref <25)
Temazepam: NEGATIVE ng/mL (ref <50)
pH: 5.8 (ref 4.5–9.0)

## 2021-03-13 LAB — DM TEMPLATE

## 2021-03-13 NOTE — Assessment & Plan Note (Addendum)
She may proceed with upcoming left eye surgery.  Low risk surgery.  If she must, she can hold her Plavix for 5 days prior to surgery.  Resume after.

## 2021-03-13 NOTE — Patient Instructions (Signed)
Medication Instructions:  The current medical regimen is effective;  continue present plan and medications.  *If you need a refill on your cardiac medications before your next appointment, please call your pharmacy*  Testing/Procedures: Your physician has requested that you have an echocardiogram. Echocardiography is a painless test that uses sound waves to create images of your heart. It provides your doctor with information about the size and shape of your heart and how well your heart's chambers and valves are working. This procedure takes approximately one hour. There are no restrictions for this procedure.  Follow-Up: At [facility], you and your health needs are our priority.  As part of our continuing mission to provide you with exceptional heart care, we have created designated Provider Care Teams.  These Care Teams include your primary Cardiologist (physician) and Advanced Practice Providers (APPs -  Physician Assistants and Nurse Practitioners) who all work together to provide you with the care you need, when you need it.  We recommend signing up for the patient portal called "MyChart".  Sign up information is provided on this After Visit Summary.  MyChart is used to connect with patients for Virtual Visits (Telemedicine).  Patients are able to view lab/test results, encounter notes, upcoming appointments, etc.  Non-urgent messages can be sent to your provider as well.   To learn more about what you can do with MyChart, go to [email or web address].    Your next appointment:   1 year(s)  The format for your next appointment:   In Person  Provider:   Candee Furbish, MD     Thank you for choosing [facility]!!

## 2021-03-13 NOTE — Progress Notes (Signed)
Cardiology Office Note:    Date:  03/13/2021   ID:  [name], DOB 28-Jul-1940, MRN [id number]  PCP:  [name], MD  [facility] Cardiologist:  Candee Furbish, MD  [facility] Electrophysiologist:  None   Referring MD: [name], MD     History of Present Illness:    [name] is a 80 y.o. female here for follow-up of CAD and pre-operative clearance.  Last visit with Truitt Merle reviewed.  Had CAD with bare-metal stent 3.5 x 20 mm express placed in the proximal LAD with 3 subsequent catheterization showing patent stent.  Intolerant to aspirin.  Ulcerative colitis.  At her last visit, she reported occasional NTG use. Laying on left side, she reported palpitations.   She was accompanied by her son who was on dialysis. He mentioned he had a donor possibility.   Since her last visit, she was seen in the ED on 02/12/21 for hyperglycemia. She was experiencing headaches with sudden hearing loss and aphasia.   Today, she is accompanied by her daughter and using a walker. She is expected to undergo surgery on her L eye because of poor eyesight.   Occasionally, she reports chest pain at night. Taking NTG improves the chest pain. She also reports her L leg will swell more than her R leg.   She believes her ED visit was caused by a different orthopedic doctor who gave her 2 new injections. She has not been driving since her ED visit.   The other day, she reports her L leg was severely swollen. Today, the swelling has gone down. Usually, her L leg tends to swell more than her R leg. Of note, she takes 3 potassium and 2 magnesium tablets per day.   She denies any palpitations or shortness of breath. No lightheadedness, headaches, syncope, orthopnea, PND, or exertional symptoms.  Past Medical History:  Diagnosis Date   Adenomatous colon polyp 02/2006   Allergy    Allergy, unspecified not elsewhere classified    Anemia    Anxiety    Arthritis    Asthma    Blood  transfusion without reported diagnosis    Bronchiectasis    CAD (coronary artery disease)    BMS to the LAD, 2002; cath 12/07/11 patent LAD stent and mild nonobstructive disease, EF 65%; Medical management   Cataract    removed both eyes   CHF (congestive heart failure) (HCC)    Diastolic   Clotting disorder ([place])    in legs per pt    Diabetes mellitus    Diverticulosis of colon    DJD (degenerative joint disease)    Fibromyalgia    GERD (gastroesophageal reflux disease)    Hypercholesterolemia    Hypertension    Microscopic hematuria    Neoplasm of kidney    Obesity    Other diseases of lung, not elsewhere classified    Pinched vertebral nerve    Pulmonary nodule    Negative PET in 2010   Stress incontinence, female    Syncope    Ulcerative colitis, left sided ([place]) 2008   Hx of   UTI (lower urinary tract infection)    Vitamin D deficiency disease     Past Surgical History:  Procedure Laterality Date   ABDOMINAL HYSTERECTOMY     CARDIAC CATHETERIZATION  11/2011   CATARACT EXTRACTION, BILATERAL  2020   CHOLECYSTECTOMY     COLONOSCOPY     COLONOSCOPY W/ BIOPSIES  11-12-12   per Dr. Fuller Plan,  diverticulosis only, repeat in 5 yrs   CORONARY STENT PLACEMENT  2002   BMS to the LAD   ESOPHAGOGASTRODUODENOSCOPY  12/2008   HAND SURGERY  01/2006   Right Hand Surgery by Dr. Amedeo Plenty   LEFT HEART CATHETERIZATION WITH CORONARY ANGIOGRAM N/A 12/07/2011   Procedure: LEFT HEART CATHETERIZATION WITH CORONARY ANGIOGRAM;  Surgeon: Peter M Martinique, MD;  Location: [facility] CATH LAB;  Service: Cardiovascular;  Laterality: N/A;   POLYPECTOMY     UPPER GASTROINTESTINAL ENDOSCOPY      Current Medications: Current Meds  Medication Sig   acetaminophen (TYLENOL) 500 MG tablet Take 500 mg by mouth every 6 (six) hours as needed for headache (pain).    albuterol (PROVENTIL) (2.5 MG/3ML) 0.083% nebulizer solution Take 3 mLs (2.5 mg total) by nebulization every 4 (four) hours as needed for wheezing or shortness  of breath.   albuterol (VENTOLIN HFA) 108 (90 BASE) MCG/ACT inhaler Inhale 2 puffs into the lungs every 4 (four) hours as needed for wheezing or shortness of breath.   ALPRAZolam (XANAX) 0.5 MG tablet Take 1 tablet (0.5 mg total) by mouth 3 (three) times daily as needed.   amLODipine (NORVASC) 10 MG tablet Take 1 tablet (10 mg total) by mouth daily.   amoxicillin-clavulanate (AUGMENTIN) 875-125 MG tablet Take 1 tablet by mouth every 12 (twelve) hours.   atenolol (TENORMIN) 50 MG tablet TAKE 1 TABLET BY MOUTH EVERY DAY   atorvastatin (LIPITOR) 20 MG tablet Take 1 tablet (20 mg total) by mouth daily. Please keep upcoming appt for future refills.   azelastine (ASTELIN) 0.1 % nasal spray Place 1 spray into both nostrils at bedtime.   Blood Glucose Monitoring Suppl (ONETOUCH VERIO FLEX SYSTEM) w/Device KIT 1 each by Does not apply route daily at 12 noon.   budesonide-formoterol (SYMBICORT) 160-4.5 MCG/ACT inhaler Inhale 2 puffs into the lungs 2 (two) times daily.   Cholecalciferol (VITAMIN D-3) 5000 UNITS TABS Take 5,000 Units by mouth daily.   clopidogrel (PLAVIX) 75 MG tablet Take 1 tablet (75 mg total) by mouth daily. Please keep upcoming appt for future refills.   diclofenac sodium (VOLTAREN) 1 % GEL As needed for pain   diphenoxylate-atropine (LOMOTIL) 2.5-0.025 MG tablet TAKE 1 TABLET BY MOUTH TWICE DAILY AS NEEDED FOR DIARRHEA OR LOOSE STOOL   Ferrous Sulfate (IRON) 325 (65 Fe) MG TABS Take 1 tablet (325 mg total) by mouth 2 (two) times daily.   fluticasone (FLONASE) 50 MCG/ACT nasal spray PLACE 2 SPRAYS IN EACH NOSTRIL AT BEDTIME   furosemide (LASIX) 40 MG tablet Take 1 tablet (40 mg total) by mouth 2 (two) times daily.   gabapentin (NEURONTIN) 600 MG tablet Take 1 tablet (600 mg total) by mouth 3 (three) times daily.   glimepiride (AMARYL) 1 MG tablet Take 2 tablets (2 mg total) by mouth daily with breakfast.   glucose blood (ONETOUCH VERIO) test strip Use to check blood glucose once daily    glycopyrrolate (ROBINUL) 1 MG tablet Take 1 tablet (1 mg total) by mouth 2 (two) times daily.   [START ON 05/10/2021] HYDROcodone-acetaminophen (NORCO) 10-325 MG tablet Take 1 tablet by mouth every 6 (six) hours as needed for moderate pain.   hydrOXYzine (VISTARIL) 25 MG capsule TAKE 1 CAPSULE (25 MG TOTAL) BY MOUTH EVERY 6 (SIX) HOURS AS NEEDED.   isosorbide mononitrate (IMDUR) 60 MG 24 hr tablet Take 1 tablet (60 mg total) by mouth daily. Please keep upcoming appt for future refills.   levocetirizine (XYZAL) 5 MG tablet Take  5 mg by mouth every evening.   LIDODERM 5 % APPLY 1 PATCH TO SKIN FOR 12 HOURS THEN REMOVE AND DISCARD PATCH WITHIN 72 HOURS OR AS DIRECTED   loperamide (IMODIUM) 2 MG capsule Take 4-6 mg by mouth daily.   losartan (COZAAR) 50 MG tablet TAKE 1 TABLET BY MOUTH EVERY DAY   magnesium oxide (MAG-OX) 400 MG tablet Take 2 tablets (800 mg total) by mouth daily.   mesalamine (LIALDA) 1.2 g EC tablet TAKE 2 TABLETS (2.4 G TOTAL) BY MOUTH 2 (TWO) TIMES DAILY.   metFORMIN (GLUCOPHAGE XR) 750 MG 24 hr tablet Take 1 tablet (750 mg total) by mouth daily with breakfast.   montelukast (SINGULAIR) 10 MG tablet Take one every morning   nabumetone (RELAFEN) 750 MG tablet TAKE 1 TABLET BY MOUTH TWICE A DAY WITH MEAL   neomycin-polymyxin-hydrocortisone (CORTISPORIN) OTIC solution Place 4 drops into both ears 4 (four) times daily.   nitroGLYCERIN (NITROSTAT) 0.4 MG SL tablet PLACE 1 TABLET (0.4 MG TOTAL) UNDER THE TONGUE EVERY 5 (FIVE) MINUTES AS NEEDED FOR CHEST PAIN.   Olopatadine HCl 0.7 % SOLN Place 1 drop into both eyes daily as needed (itching/allergies).   ondansetron (ZOFRAN-ODT) 4 MG disintegrating tablet TAKE 1 TABLET BY MOUTH EVERY 8 HOURS AS NEEDED FOR NAUSEA AND VOMITING   OneTouch Delica Lancets 23N MISC 1 each by Does not apply route daily at 12 noon.   Pancrelipase, Lip-Prot-Amyl, (ZENPEP) 20000-63000 units CPEP TAKE 3 CAPSULES (40,000 UNITS) WITH MEALS AND 2 CAPSULE (20,000 UNITS)  WITH A SNACK   pantoprazole (PROTONIX) 40 MG tablet Take 1 tablet (40 mg total) by mouth 2 (two) times daily.   Polyethyl Glycol-Propyl Glycol (SYSTANE OP) Place 1 drop into both eyes daily. For dry eyes   potassium chloride SA (KLOR-CON M20) 20 MEQ tablet Take 1 tablet (20 mEq total) by mouth 3 (three) times daily. Please keep upcoming appt for future refills.   tiZANidine (ZANAFLEX) 4 MG tablet TAKE 1 TABLET BY MOUTH TWICE A DAY   Current Facility-Administered Medications for the 03/13/21 encounter (Office Visit) with Jerline Pain, MD  Medication   ipratropium-albuterol (DUONEB) 0.5-2.5 (3) MG/3ML nebulizer solution 3 mL     Allergies:   Doxycycline, Aspirin, Azithromycin, Caffeine, Ciprofloxacin, Codeine, Oxycodone, Sulfamethizole, and Tramadol hcl   Social History   Socioeconomic History   Marital status: Divorced    Spouse name: Not on file   Number of children: 3   Years of education: Not on file   Highest education level: Not on file  Occupational History   Occupation: Retired    Fish farm manager: RETIRED  Tobacco Use   Smoking status: Former    Packs/day: 2.00    Years: 15.00    Pack years: 30.00    Types: Cigarettes    Quit date: 04/30/1973    Years since quitting: 47.9   Smokeless tobacco: Never   Tobacco comments:    ex-smoker: smoked for 10-15 years up to 2ppd, quit 1975.  Vaping Use   Vaping Use: Never used  Substance and Sexual Activity   Alcohol use: Not Currently    Alcohol/week: 0.0 standard drinks    Comment: none now    Drug use: No   Sexual activity: Never  Other Topics Concern   Not on file  Social History Narrative   Right handed   Social Determinants of Health   Financial Resource Strain: Low Risk    Difficulty of Paying Living Expenses: Not hard at all  Food  Insecurity: No Food Insecurity   Worried About Charity fundraiser in the Last Year: Never true   Ran Out of Food in the Last Year: Never true  Transportation Needs: No Transportation Needs    Lack of Transportation (Medical): No   Lack of Transportation (Non-Medical): No  Physical Activity: Inactive   Days of Exercise per Week: 0 days   Minutes of Exercise per Session: 0 min  Stress: No Stress Concern Present   Feeling of Stress : Not at all  Social Connections: Moderately Isolated   Frequency of Communication with Friends and Family: More than three times a week   Frequency of Social Gatherings with Friends and Family: More than three times a week   Attends Religious Services: More than 4 times per year   Active Member of [employer] or Organizations: No   Attends Archivist Meetings: Never   Marital Status: Widowed     Family History: [name] family history includes Breast cancer in her daughter; Colon polyps in her father; Diabetes in her brother and sister; Heart attack in her father; Hypertension in her sister; Parkinsonism in her mother; Pneumonia in her father; Sarcoidosis in her brother. There is no history of Colon cancer, Esophageal cancer, Rectal cancer, or Stomach cancer.  ROS:   Please see the history of present illness.    (+) Poor eyesight (L eye) (+) Chest pain (+) Bilateral LE swelling (L>R) all other systems reviewed and are negative.  EKGs/Labs/Other Studies Reviewed:    Lower Venous Study 02/26/19 Right: Findings consistent with chronic deep vein thrombosis involving the  right peroneal veins.  All other veins visualized appear fully compressible and demonstrate  appropriate Doppler characteristics.  Left: No evidence of deep vein thrombosis in the lower extremity. No  indirect evidence of obstruction proximal to the inguinal ligament.   Myocardial Lexiscan 12/27/16 The left ventricular ejection fraction is normal (55-65%). Nuclear stress EF: 65%. There was no ST segment deviation noted during stress. No T wave inversion was noted during stress. Defect 1: There is a small defect of moderate severity present in the basal anterior and  mid anterior location. This was consistent with artifact. The study is normal. This is a low risk study.  EKG:  EKG was personally reviewed 03/13/21: Sinus rhythm, rate 72 bpm 03/11/20: sinus rhythm 66 with nonspecific ST-T wave changes  Recent Labs: 07/18/2020: Magnesium 1.8; TSH 2.45 02/12/2021: ALT 14; BUN 23; Creatinine, Ser 0.82; Hemoglobin 13.5; Platelets 456; Potassium 5.3; Sodium 133  Recent Lipid Panel    Component Value Date/Time   CHOL 159 07/18/2020 1143   CHOL 127 12/10/2019 0744   TRIG 71.0 07/18/2020 1143   HDL 66.10 07/18/2020 1143   HDL 59 12/10/2019 0744   CHOLHDL 2 07/18/2020 1143   VLDL 14.2 07/18/2020 1143   LDLCALC 78 07/18/2020 1143   LDLCALC 51 12/10/2019 0744     Risk Assessment/Calculations:       Physical Exam:    VS:  BP 120/80 (BP Location: Left Arm, Patient Position: Sitting, Cuff Size: Normal)   Pulse 72   Ht 5' (1.524 m)   Wt 202 lb (91.6 kg)   LMP  (LMP Unknown)   BMI 39.45 kg/m     Wt Readings from Last 3 Encounters:  03/13/21 202 lb (91.6 kg)  03/09/21 204 lb 6.4 oz (92.7 kg)  02/14/21 204 lb (92.5 kg)     GEN:  Well nourished, well developed in no acute distress HEENT: Normal NECK:  No JVD; No carotid bruits LYMPHATICS: No lymphadenopathy CARDIAC: RRR, 1/6 systolic murmur, rubs, gallops RESPIRATORY:  Clear to auscultation without rales, wheezing or rhonchi  ABDOMEN: Soft, non-tender, non-distended MUSCULOSKELETAL:  No edema; No deformity  SKIN: Warm and dry NEUROLOGIC:  Alert and oriented x 3 PSYCHIATRIC:  Normal affect   ASSESSMENT:    1. Coronary artery disease involving native coronary artery of native heart with angina pectoris ([place])   2. Pure hypercholesterolemia   3. Morbid obesity ([place])   4. Diabetes mellitus with coincident hypertension ([place])   5. Heart murmur   6. Leg cramps   7. Preop cardiovascular exam   8. Lower extremity edema     PLAN:    Coronary artery disease involving native coronary artery of  native heart with angina pectoris (HCC) CAD with bare-metal stent 3.5 x 20 mm express stent placed in the proximal LAD with 3 subsequent catheterizations showing patent stent.  She has been intolerant in the past aspirin.  She has had anginal symptoms at times.  Nitroglycerin utilized.  Atenolol and isosorbide as well helping.  Continuing with atorvastatin.  Prior LDL in the 50s.  Excellent.  Diabetes under good control also by Dr. Sarajane Jews.  Continue with goal-directed medical therapy.  Pure hypercholesterolemia Currently on atorvastatin 20 mg a day.  Her LDL has been in the 50s.  Willing to continue to utilize this dose.  Excellent.  No myalgias.  Morbid obesity ([place]) Continue to encourage weight loss.  Has been challenging secondary to chronic pain.  Diabetes mellitus with coincident hypertension ([place]) Excellent control by Dr. Sarajane Jews.  Medications reviewed.  Heart murmur Systolic murmur appreciated.  We will go ahead and check an echocardiogram.  Has been about 10 years.  Leg cramps She takes potassium supplementation, magnesium as well.  Encourage stretching.  Preop cardiovascular exam She may proceed with upcoming left eye surgery.  Low risk surgery.  If she must, she can hold her Plavix for 5 days prior to surgery.  Resume after.  Lower extremity edema Left greater than right edema.  She takes Lasix to help her with this.  Also takes potassium supplementation with it.     Shared Decision Making/Informed Consent        Medication Adjustments/Labs and Tests Ordered: Current medicines are reviewed at length with the patient today.  Concerns regarding medicines are outlined above.  Orders Placed This Encounter  Procedures   ECHOCARDIOGRAM COMPLETE    No orders of the defined types were placed in this encounter.   Patient Instructions  Medication Instructions:  The current medical regimen is effective;  continue present plan and medications.  *If you need a refill on your  cardiac medications before your next appointment, please call your pharmacy*  Testing/Procedures: Your physician has requested that you have an echocardiogram. Echocardiography is a painless test that uses sound waves to create images of your heart. It provides your doctor with information about the size and shape of your heart and how well your heart's chambers and valves are working. This procedure takes approximately one hour. There are no restrictions for this procedure.  Follow-Up: At [facility], you and your health needs are our priority.  As part of our continuing mission to provide you with exceptional heart care, we have created designated Provider Care Teams.  These Care Teams include your primary Cardiologist (physician) and Advanced Practice Providers (APPs -  Physician Assistants and Nurse Practitioners) who all work together to provide you with the care you  need, when you need it.  We recommend signing up for the patient portal called "MyChart".  Sign up information is provided on this After Visit Summary.  MyChart is used to connect with patients for Virtual Visits (Telemedicine).  Patients are able to view lab/test results, encounter notes, upcoming appointments, etc.  Non-urgent messages can be sent to your provider as well.   To learn more about what you can do with MyChart, go to [email or web address].    Your next appointment:   1 year(s)  The format for your next appointment:   In Person  Provider:   Candee Furbish, MD     Thank you for choosing [facility]!!     Wilhemina Bonito as a scribe for Candee Furbish, MD.,have documented all relevant documentation on the behalf of Candee Furbish, MD,as directed by  Candee Furbish, MD while in the presence of Candee Furbish, MD.  I, Candee Furbish, MD, have reviewed all documentation for this visit. The documentation on 03/13/21 for the exam, diagnosis, procedures, and orders are all accurate and complete.    Signed, Candee Furbish, MD  03/13/2021 11:36 AM    [place]

## 2021-03-13 NOTE — Assessment & Plan Note (Signed)
She takes potassium supplementation, magnesium as well.  Encourage stretching.

## 2021-03-13 NOTE — Assessment & Plan Note (Signed)
Left greater than right edema.  She takes Lasix to help her with this.  Also takes potassium supplementation with it.

## 2021-03-13 NOTE — Assessment & Plan Note (Signed)
CAD with bare-metal stent 3.5 x 20 mm express stent placed in the proximal LAD with 3 subsequent catheterizations showing patent stent.  She has been intolerant in the past aspirin.  She has had anginal symptoms at times.  Nitroglycerin utilized.  Atenolol and isosorbide as well helping.  Continuing with atorvastatin.  Prior LDL in the 50s.  Excellent.  Diabetes under good control also by Dr. Sarajane Jews.  Continue with goal-directed medical therapy.

## 2021-03-13 NOTE — Assessment & Plan Note (Signed)
Systolic murmur appreciated.  We will go ahead and check an echocardiogram.  Has been about 10 years.

## 2021-03-13 NOTE — Assessment & Plan Note (Signed)
Excellent control by Dr. Sarajane Jews.  Medications reviewed.

## 2021-03-13 NOTE — Assessment & Plan Note (Signed)
Currently on atorvastatin 20 mg a day.  Her LDL has been in the 50s.  Willing to continue to utilize this dose.  Excellent.  No myalgias.

## 2021-03-13 NOTE — Assessment & Plan Note (Signed)
Continue to encourage weight loss.  Has been challenging secondary to chronic pain.

## 2021-03-15 ENCOUNTER — Telehealth: Payer: Self-pay | Admitting: Family Medicine

## 2021-03-15 NOTE — Telephone Encounter (Signed)
Please contact [facility] home [facility] to assess the patient for ADL assitance. She cannot dress, bathe, or prepare food with out help

## 2021-03-17 ENCOUNTER — Telehealth: Payer: Self-pay

## 2021-03-17 DIAGNOSIS — E119 Type 2 diabetes mellitus without complications: Secondary | ICD-10-CM

## 2021-03-17 DIAGNOSIS — G8929 Other chronic pain: Secondary | ICD-10-CM

## 2021-03-17 DIAGNOSIS — I10 Essential (primary) hypertension: Secondary | ICD-10-CM

## 2021-03-17 DIAGNOSIS — I5032 Chronic diastolic (congestive) heart failure: Secondary | ICD-10-CM

## 2021-03-17 DIAGNOSIS — M5441 Lumbago with sciatica, right side: Secondary | ICD-10-CM

## 2021-03-17 NOTE — Telephone Encounter (Signed)
Home health referral placed

## 2021-03-17 NOTE — Telephone Encounter (Signed)
Pt referral placed

## 2021-03-22 ENCOUNTER — Telehealth (INDEPENDENT_AMBULATORY_CARE_PROVIDER_SITE_OTHER): Payer: PPO | Admitting: Physician Assistant

## 2021-03-22 ENCOUNTER — Encounter: Payer: Self-pay | Admitting: Physician Assistant

## 2021-03-22 DIAGNOSIS — J45901 Unspecified asthma with (acute) exacerbation: Secondary | ICD-10-CM | POA: Diagnosis not present

## 2021-03-22 MED ORDER — LEVOFLOXACIN 500 MG PO TABS: 500.0000 mg | ORAL_TABLET | Freq: Every day | ORAL | 0 refills | Status: AC

## 2021-03-22 NOTE — Progress Notes (Signed)
Virtual Visit via Telephone Note  I connected with [name] on 03/22/21 at 11:30 AM EST by telephone and verified that I am speaking with the correct person using two identifiers.  Location: Patient: Home Provider: Therapist, music at [employer]    I discussed the limitations, risks, security and privacy concerns of performing an evaluation and management service by telephone and the availability of in person appointments. I also discussed with the patient that there may be a patient responsible charge related to this service. The patient expressed understanding and agreed to proceed.   History of Present Illness:  80 yo patient with CHF, DM, CAD, Asthma presents for video visit due to the following:   Chief complaint: Cough & congestion Symptom onset: in the last week Pertinent positives: Some SOB improved with albuterol neb Pertinent negatives: Fever, weakness, chest pain, body aches, n/v/d  Treatments tried: Inhaler, nasal spray Vaccine status: Says she had COVID-19 booster; cannot take flu shots Sick exposure: Family member with similar symptoms recently   She did not take a COVID-19 home test.    Observations/Objective:  Speaking clearly without pauses or gasps for breath.  No way to check her oxygen saturation at home.  Harsh cough, sounds congested.   Assessment and Plan:  1. Moderate asthma with exacerbation, unspecified whether persistent -Discussed with patient the difficulty in evaluating over the telephone. Very low threshold for her to seek in-person care, esp if she develops any sudden SOB, fever, CP, severe fatigue, etc. Pt agreeable. -States this feels like her normal asthma exacerbation that she has one to two times per year. She does well with Levaquin. Rx filled for her to take, caution with antibiotic use provided. -She will continue using her asthma nebs and inhalers as directed. She does not need refills at this time. -She will call and  update PCP on Monday with how she is doing. ED / urgent care this weekend if acutely worse.   Follow Up Instructions:    I discussed the assessment and treatment plan with the patient. The patient was provided an opportunity to ask questions and all were answered. The patient agreed with the plan and demonstrated an understanding of the instructions.   The patient was advised to call back or seek an in-person evaluation if the symptoms worsen or if the condition fails to improve as anticipated.  I provided 11 minutes and 4 seconds of non-face-to-face time during this encounter.   Jerrik Housholder M Elpidio Thielen, PA-C

## 2021-03-27 ENCOUNTER — Telehealth: Payer: Self-pay | Admitting: Cardiology

## 2021-03-27 NOTE — Telephone Encounter (Signed)
I faxed Dr. Marlou Porch' note.

## 2021-03-27 NOTE — Telephone Encounter (Signed)
Please send over plavix clearance included in after visit notes to Raliegh Ip @ Fax# [phone]

## 2021-03-29 ENCOUNTER — Other Ambulatory Visit: Payer: Self-pay | Admitting: Gastroenterology

## 2021-03-30 NOTE — Telephone Encounter (Signed)
Follow Up:       Still no clearance for patient's Plavix. She need this asap, please fax to [phone].

## 2021-03-30 NOTE — Telephone Encounter (Addendum)
    Patient Name: [name]  DOB: 08/30/1940 MRN: [id number]  Primary Cardiologist: Candee Furbish, MD  Chart reviewed as part of pre-operative protocol coverage. Patient was recently seen by Dr. Marlou Porch on Given past medical history and time since last visit, based on 03/13/2021 at which time patient noted occasional chest pain at night but was overall stable from a cardiac standpoint. She was advised to follow-up in 1 year. At that visit, she mentioned need for potential left eye surgery. Dr. Marlou Porch felt like she was at acceptable risk for that low risk procedure and stated she could hold Plavix for 5 days if she must. Spinal injections are also low risk procedures and usually done under local anesthesia so she should also be at acceptable risk for this. Dr. Marlou Porch has previously given the OK to hold Plavix for 7 days for spinal injections in 2019. She has not had any new cardiac events since that time so we can use the same recommendation. OK to hold Plavix for 5-7 days prior to procedure. Please restart as soon as safely possible afterwards.  I will route this recommendation to the requesting party via Epic fax function and remove from pre-op pool.  Please call with questions.  Darreld Mclean, PA-C 03/30/2021, 3:08 PM

## 2021-03-31 ENCOUNTER — Other Ambulatory Visit: Payer: Self-pay

## 2021-03-31 DIAGNOSIS — E119 Type 2 diabetes mellitus without complications: Secondary | ICD-10-CM

## 2021-03-31 MED ORDER — ONETOUCH VERIO VI STRP: ORAL_STRIP | 1 refills | Status: DC

## 2021-04-11 ENCOUNTER — Other Ambulatory Visit: Payer: Self-pay

## 2021-04-11 ENCOUNTER — Ambulatory Visit (HOSPITAL_COMMUNITY): Payer: PPO | Attending: Internal Medicine

## 2021-04-11 DIAGNOSIS — R011 Cardiac murmur, unspecified: Secondary | ICD-10-CM | POA: Insufficient documentation

## 2021-04-11 LAB — ECHOCARDIOGRAM COMPLETE
Area-P 1/2: 3.22 cm2
S' Lateral: 2.7 cm

## 2021-04-18 ENCOUNTER — Telehealth: Payer: Self-pay | Admitting: Cardiology

## 2021-04-18 DIAGNOSIS — I77810 Thoracic aortic ectasia: Secondary | ICD-10-CM

## 2021-04-18 DIAGNOSIS — I10 Essential (primary) hypertension: Secondary | ICD-10-CM

## 2021-04-18 NOTE — Telephone Encounter (Signed)
Normal pump function  Turbulent flow when blood leaving heart is causing murmur - benign.  Dilatated aorta 42 mm.  Repeat echo in one year to monitor aorta size.  Candee Furbish, MD   Reviewed results with pt who states understanding.  She will repeat in 1 year as instructed.    Order placed for 1 year

## 2021-04-18 NOTE — Telephone Encounter (Signed)
° °  Pt is returning call to get echo result

## 2021-04-19 DIAGNOSIS — Z87891 Personal history of nicotine dependence: Secondary | ICD-10-CM | POA: Diagnosis not present

## 2021-04-19 DIAGNOSIS — I10 Essential (primary) hypertension: Secondary | ICD-10-CM | POA: Diagnosis not present

## 2021-04-19 NOTE — Addendum Note (Signed)
Addended by: Drue Novel I on: 04/19/2021 03:30 PM   Modules accepted: Orders

## 2021-04-21 ENCOUNTER — Other Ambulatory Visit: Payer: Self-pay | Admitting: Family Medicine

## 2021-05-01 DIAGNOSIS — R600 Localized edema: Secondary | ICD-10-CM | POA: Diagnosis not present

## 2021-05-01 DIAGNOSIS — G589 Mononeuropathy, unspecified: Secondary | ICD-10-CM | POA: Diagnosis not present

## 2021-05-02 ENCOUNTER — Encounter: Payer: Self-pay | Admitting: Family Medicine

## 2021-05-02 ENCOUNTER — Telehealth (INDEPENDENT_AMBULATORY_CARE_PROVIDER_SITE_OTHER): Payer: PPO | Admitting: Family Medicine

## 2021-05-02 ENCOUNTER — Other Ambulatory Visit: Payer: Self-pay

## 2021-05-02 DIAGNOSIS — E119 Type 2 diabetes mellitus without complications: Secondary | ICD-10-CM

## 2021-05-02 DIAGNOSIS — I10 Essential (primary) hypertension: Secondary | ICD-10-CM

## 2021-05-02 NOTE — Progress Notes (Signed)
Subjective:    Patient ID: [name], female    DOB: July 13, 1940, 81 y.o.   MRN: [id number]  HPI Here to ask whether she can start driving her car again. Her son and I agreed that she should not drive after she was hospitalized with uncontrolled diabetes last fall. She has not driven since then. I spoke to both her and her son today, and they say her diabetes is now under excellent control. Her am fasting glucoses have come down from the high 200's a month ago to the 80's to 110 range now. She feels better, and her son says her mental state is back to baseline. Her son thinks she can drive for short distances now (like going to the grocery store) and never at night.  Virtual Visit via Telephone Note  I connected with the patient on 05/02/21 at  3:45 PM EST by telephone and verified that I am speaking with the correct person using two identifiers.   I discussed the limitations, risks, security and privacy concerns of performing an evaluation and management service by telephone and the availability of in person appointments. I also discussed with the patient that there may be a patient responsible charge related to this service. The patient expressed understanding and agreed to proceed.  Location patient: home Location provider: work or home office Participants present for the call: patient, provider Patient did not have a visit in the prior 7 days to address this/these issue(s).   History of Present Illness:    Observations/Objective: Patient sounds cheerful and well on the phone. I do not appreciate any SOB. Speech and thought processing are grossly intact. Patient reported vitals:  Assessment and Plan: Her diabetes is now well controlled. I agreed that she can drive short distances again. However the first time or two she drives I asked that a family member ride with her to make sure she can drive safely.  Alysia Penna, MD   Follow Up Instructions:     [phone]  [phone] 21-30 I did not refer this patient for an OV in the next 24 hours for this/these issue(s).  I discussed the assessment and treatment plan with the patient. The patient was provided an opportunity to ask questions and all were answered. The patient agreed with the plan and demonstrated an understanding of the instructions.   The patient was advised to call back or seek an in-person evaluation if the symptoms worsen or if the condition fails to improve as anticipated.  I provided 24 minutes of non-face-to-face time during this encounter.   Alysia Penna, MD    Review of Systems     Objective:   Physical Exam        Assessment & Plan:

## 2021-05-04 ENCOUNTER — Other Ambulatory Visit: Payer: Self-pay | Admitting: Cardiology

## 2021-05-08 ENCOUNTER — Other Ambulatory Visit: Payer: Self-pay | Admitting: Gastroenterology

## 2021-05-11 DIAGNOSIS — G589 Mononeuropathy, unspecified: Secondary | ICD-10-CM | POA: Diagnosis not present

## 2021-05-11 DIAGNOSIS — R600 Localized edema: Secondary | ICD-10-CM | POA: Diagnosis not present

## 2021-05-12 ENCOUNTER — Ambulatory Visit: Payer: PPO

## 2021-05-15 ENCOUNTER — Other Ambulatory Visit: Payer: Self-pay | Admitting: Cardiology

## 2021-05-16 DIAGNOSIS — M47812 Spondylosis without myelopathy or radiculopathy, cervical region: Secondary | ICD-10-CM | POA: Diagnosis not present

## 2021-05-16 DIAGNOSIS — M7542 Impingement syndrome of left shoulder: Secondary | ICD-10-CM | POA: Diagnosis not present

## 2021-05-16 DIAGNOSIS — M25512 Pain in left shoulder: Secondary | ICD-10-CM | POA: Diagnosis not present

## 2021-05-22 ENCOUNTER — Ambulatory Visit (INDEPENDENT_AMBULATORY_CARE_PROVIDER_SITE_OTHER): Payer: PPO

## 2021-05-22 VITALS — Ht 61.0 in | Wt 202.0 lb

## 2021-05-22 DIAGNOSIS — Z0000 Encounter for general adult medical examination without abnormal findings: Secondary | ICD-10-CM

## 2021-05-22 NOTE — Patient Instructions (Addendum)
[name] , Thank you for taking time to come for your Medicare Wellness Visit. I appreciate your ongoing commitment to your health goals. Please review the following plan we discussed and let me know if I can assist you in the future.   These are the goals we discussed:  Goals      Exercise 3x per week (30 min per time)     Patient Stated     I would like to walk better so I don't have to use my walker.         This is a list of the screening recommended for you and due dates:  Health Maintenance  Topic Date Due   Complete foot exam   07/28/2019   Eye exam for diabetics  05/22/2021*   COVID-19 Vaccine (3 - Booster for Pfizer series) 06/07/2021*   Hemoglobin A1C  06/22/2021*   Zoster (Shingles) Vaccine (1 of 2) 08/20/2021*   Colon Cancer Screening  05/22/2022*   Tetanus Vaccine  05/22/2022*   Hepatitis C Screening: USPSTF Recommendation to screen - Ages 18-79 yo.  05/22/2022*   Pneumonia Vaccine  Completed   DEXA scan (bone density measurement)  Completed   HPV Vaccine  Aged Out   Flu Shot  Discontinued  *Topic was postponed. The date shown is not the original due date.   Opioid Pain Medicine Management Opioids are powerful medicines that are used to treat moderate to severe pain. When used for short periods of time, they can help you to: Sleep better. Do better in physical or occupational therapy. Feel better in the first few days after an injury. Recover from surgery. Opioids should be taken with the supervision of a trained health care provider. They should be taken for the shortest period of time possible. This is because opioids can be addictive, and the longer you take opioids, the greater your risk of addiction. This addiction can also be called opioid use disorder. What are the risks? Using opioid pain medicines for longer than 3 days increases your risk of side effects. Side effects include: Constipation. Nausea and vomiting. Breathing difficulties (respiratory  depression). Drowsiness. Confusion. Opioid use disorder. Itching. Taking opioid pain medicine for a long period of time can affect your ability to do daily tasks. It also puts you at risk for: Motor vehicle crashes. Depression. Suicide. Heart attack. Overdose, which can be life-threatening. What is a pain treatment plan? A pain treatment plan is an agreement between you and your health care provider. Pain is unique to each person, and treatments vary depending on your condition. To manage your pain, you and your health care provider need to work together. To help you do this: Discuss the goals of your treatment, including how much pain you might expect to have and how you will manage the pain. Review the risks and benefits of taking opioid medicines. Remember that a good treatment plan uses more than one approach and minimizes the chance of side effects. Be honest about the amount of medicines you take and about any drug or alcohol use. Get pain medicine prescriptions from only one health care provider. Pain can be managed with many types of alternative treatments. Ask your health care provider to refer you to one or more specialists who can help you manage pain through: Physical or occupational therapy. Counseling (cognitive behavioral therapy). Good nutrition. Biofeedback. Massage. Meditation. Non-opioid medicine. Following a gentle exercise program. How to use opioid pain medicine Taking medicine Take your pain medicine exactly as told  by your health care provider. Take it only when you need it. If your pain gets less severe, you may take less than your prescribed dose if your health care provider approves. If you are not having pain, do nottake pain medicine unless your health care provider tells you to take it. If your pain is severe, do nottry to treat it yourself by taking more pills than instructed on your prescription. Contact your health care provider for help. Write down  the times when you take your pain medicine. It is easy to become confused while on pain medicine. Writing the time can help you avoid overdose. Take other over-the-counter or prescription medicines only as told by your health care provider. Keeping yourself and others safe  While you are taking opioid pain medicine: Do not drive, use machinery, or power tools. Do not sign legal documents. Do not drink alcohol. Do not take sleeping pills. Do not supervise children by yourself. Do not do activities that require climbing or being in high places. Do not go to a lake, river, ocean, spa, or swimming pool. Do not share your pain medicine with anyone. Keep pain medicine in a locked cabinet or in a secure area where pets and children cannot reach it. Stopping your use of opioids If you have been taking opioid medicine for more than a few weeks, you may need to slowly decrease (taper) how much you take until you stop completely. Tapering your use of opioids can decrease your risk of symptoms of withdrawal, such as: Pain and cramping in the abdomen. Nausea. Sweating. Sleepiness. Restlessness. Uncontrollable shaking (tremors). Cravings for the medicine. Do not attempt to taper your use of opioids on your own. Talk with your health care provider about how to do this. Your health care provider may prescribe a step-down schedule based on how much medicine you are taking and how long you have been taking it. Getting rid of leftover pills Do not save any leftover pills. Get rid of leftover pills safely by: Taking the medicine to a prescription take-back program. This is usually offered by the county or law enforcement. Bringing them to a pharmacy that has a drug disposal container. Flushing them down the toilet. Check the label or package insert of your medicine to see whether this is safe to do. Throwing them out in the trash. Check the label or package insert of your medicine to see whether this is  safe to do. If it is safe to throw it out, remove the medicine from the original container, put it into a sealable bag or container, and mix it with used coffee grounds, food scraps, dirt, or cat litter before putting it in the trash. Follow these instructions at home: Activity Do exercises as told by your health care provider. Avoid activities that make your pain worse. Return to your normal activities as told by your health care provider. Ask your health care provider what activities are safe for you. General instructions You may need to take these actions to prevent or treat constipation: Drink enough fluid to keep your urine pale yellow. Take over-the-counter or prescription medicines. Eat foods that are high in fiber, such as beans, whole grains, and fresh fruits and vegetables. Limit foods that are high in fat and processed sugars, such as fried or sweet foods. Keep all follow-up visits. This is important. Where to find support If you have been taking opioids for a long time, you may benefit from receiving support for quitting from a local  support group or counselor. Ask your health care provider for a referral to these resources in your area. Where to find more information Centers for Disease Control and Prevention (CDC): http://www.wolf.info/ [place] Food and Drug Administration (FDA): [place] Get help right away if: You may have taken too much of an opioid (overdosed). Common symptoms of an overdose: Your breathing is slower or more shallow than normal. You have a very slow heartbeat (pulse). You have slurred speech. You have nausea and vomiting. Your pupils become very small. You have other potential symptoms: You are very confused. You faint or feel like you will faint. You have cold, clammy skin. You have blue lips or fingernails. You have thoughts of harming yourself or harming others. These symptoms may represent a serious problem that is an emergency. Do not wait to see if the  symptoms will go away. Get medical help right away. Call your local emergency services (911 in the [place] Do not drive yourself to the hospital.  If you ever feel like you may hurt yourself or others, or have thoughts about taking your own life, get help right away. Go to your nearest emergency department or: Call your local emergency services (911 in the [place] Call the [name] ([facility]) 276 469 6231 in the [place] Call a suicide crisis helpline, such as the [place] at 838-727-0345 or 988 in the [place]. This is open 24 hours a day in the [place] Text the Crisis Text Line at 608-144-7073 (in the [place].). Summary Opioid medicines can help you manage moderate to severe pain for a short period of time. A pain treatment plan is an agreement between you and your health care provider. Discuss the goals of your treatment, including how much pain you might expect to have and how you will manage the pain. If you think that you or someone else may have taken too much of an opioid, get medical help right away. This information is not intended to replace advice given to you by your health care provider. Make sure you discuss any questions you have with your health care provider. Document Revised: 11/09/2020 Document Reviewed: 07/27/2020 Elsevier Patient Education  [place] directives: No  Conditions/risks identified: None  Next appointment: Follow up in one year for your annual wellness visit   Preventive Care 65 Years and Older, Female Preventive care refers to lifestyle choices and visits with your health care provider that can promote health and wellness. What does preventive care include? A yearly physical exam. This is also called an annual well check. Dental exams once or twice a year. Routine eye exams. Ask your health care provider how often you should have your eyes checked. Personal lifestyle choices, including: Daily care of your teeth  and gums. Regular physical activity. Eating a healthy diet. Avoiding tobacco and drug use. Limiting alcohol use. Practicing safe sex. Taking low-dose aspirin every day. Taking vitamin and mineral supplements as recommended by your health care provider. What happens during an annual well check? The services and screenings done by your health care provider during your annual well check will depend on your age, overall health, lifestyle risk factors, and family history of disease. Counseling  Your health care provider may ask you questions about your: Alcohol use. Tobacco use. Drug use. Emotional well-being. Home and relationship well-being. Sexual activity. Eating habits. History of falls. Memory and ability to understand (cognition). Work and work Statistician. Reproductive health. Screening  You may have the following tests or measurements:  Height, weight, and BMI. Blood pressure. Lipid and cholesterol levels. These may be checked every 5 years, or more frequently if you are over 66 years old. Skin check. Lung cancer screening. You may have this screening every year starting at age 12 if you have a 30-pack-year history of smoking and currently smoke or have quit within the past 15 years. Fecal occult blood test (FOBT) of the stool. You may have this test every year starting at age 49. Flexible sigmoidoscopy or colonoscopy. You may have a sigmoidoscopy every 5 years or a colonoscopy every 10 years starting at age 82. Hepatitis C blood test. Hepatitis B blood test. Sexually transmitted disease (STD) testing. Diabetes screening. This is done by checking your blood sugar (glucose) after you have not eaten for a while (fasting). You may have this done every 1-3 years. Bone density scan. This is done to screen for osteoporosis. You may have this done starting at age 27. Mammogram. This may be done every 1-2 years. Talk to your health care provider about how often you should have regular  mammograms. Talk with your health care provider about your test results, treatment options, and if necessary, the need for more tests. Vaccines  Your health care provider may recommend certain vaccines, such as: Influenza vaccine. This is recommended every year. Tetanus, diphtheria, and acellular pertussis (Tdap, Td) vaccine. You may need a Td booster every 10 years. Zoster vaccine. You may need this after age 23. Pneumococcal 13-valent conjugate (PCV13) vaccine. One dose is recommended after age 69. Pneumococcal polysaccharide (PPSV23) vaccine. One dose is recommended after age 33. Talk to your health care provider about which screenings and vaccines you need and how often you need them. This information is not intended to replace advice given to you by your health care provider. Make sure you discuss any questions you have with your health care provider. Document Released: 05/13/2015 Document Revised: 01/04/2016 Document Reviewed: 02/15/2015 Elsevier Interactive Patient Education  2017 [place] Prevention in the Home Falls can cause injuries. They can happen to people of all ages. There are many things you can do to make your home safe and to help prevent falls. What can I do on the outside of my home? Regularly fix the edges of walkways and driveways and fix any cracks. Remove anything that might make you trip as you walk through a door, such as a raised step or threshold. Trim any bushes or trees on the path to your home. Use bright outdoor lighting. Clear any walking paths of anything that might make someone trip, such as rocks or tools. Regularly check to see if handrails are loose or broken. Make sure that both sides of any steps have handrails. Any raised decks and porches should have guardrails on the edges. Have any leaves, snow, or ice cleared regularly. Use sand or salt on walking paths during winter. Clean up any spills in your garage right away. This includes oil  or grease spills. What can I do in the bathroom? Use night lights. Install grab bars by the toilet and in the tub and shower. Do not use towel bars as grab bars. Use non-skid mats or decals in the tub or shower. If you need to sit down in the shower, use a plastic, non-slip stool. Keep the floor dry. Clean up any water that spills on the floor as soon as it happens. Remove soap buildup in the tub or shower regularly. Attach bath mats securely with double-sided non-slip rug  tape. Do not have throw rugs and other things on the floor that can make you trip. What can I do in the bedroom? Use night lights. Make sure that you have a light by your bed that is easy to reach. Do not use any sheets or blankets that are too big for your bed. They should not hang down onto the floor. Have a firm chair that has side arms. You can use this for support while you get dressed. Do not have throw rugs and other things on the floor that can make you trip. What can I do in the kitchen? Clean up any spills right away. Avoid walking on wet floors. Keep items that you use a lot in easy-to-reach places. If you need to reach something above you, use a strong step stool that has a grab bar. Keep electrical cords out of the way. Do not use floor polish or wax that makes floors slippery. If you must use wax, use non-skid floor wax. Do not have throw rugs and other things on the floor that can make you trip. What can I do with my stairs? Do not leave any items on the stairs. Make sure that there are handrails on both sides of the stairs and use them. Fix handrails that are broken or loose. Make sure that handrails are as long as the stairways. Check any carpeting to make sure that it is firmly attached to the stairs. Fix any carpet that is loose or worn. Avoid having throw rugs at the top or bottom of the stairs. If you do have throw rugs, attach them to the floor with carpet tape. Make sure that you have a light  switch at the top of the stairs and the bottom of the stairs. If you do not have them, ask someone to add them for you. What else can I do to help prevent falls? Wear shoes that: Do not have high heels. Have rubber bottoms. Are comfortable and fit you well. Are closed at the toe. Do not wear sandals. If you use a stepladder: Make sure that it is fully opened. Do not climb a closed stepladder. Make sure that both sides of the stepladder are locked into place. Ask someone to hold it for you, if possible. Clearly mark and make sure that you can see: Any grab bars or handrails. First and last steps. Where the edge of each step is. Use tools that help you move around (mobility aids) if they are needed. These include: Canes. Walkers. Scooters. Crutches. Turn on the lights when you go into a dark area. Replace any light bulbs as soon as they burn out. Set up your furniture so you have a clear path. Avoid moving your furniture around. If any of your floors are uneven, fix them. If there are any pets around you, be aware of where they are. Review your medicines with your doctor. Some medicines can make you feel dizzy. This can increase your chance of falling. Ask your doctor what other things that you can do to help prevent falls. This information is not intended to replace advice given to you by your health care provider. Make sure you discuss any questions you have with your health care provider. Document Released: 02/10/2009 Document Revised: 09/22/2015 Document Reviewed: 05/21/2014 Elsevier Interactive Patient Education  2017 [employer].

## 2021-05-22 NOTE — Progress Notes (Signed)
Subjective:   [name] is a 81 y.o. female who presents for Medicare Annual (Subsequent) preventive examination.  Review of Systems    No ROS      Objective:    Today's Vitals   05/22/21 0900  Weight: 202 lb (91.6 kg)  Height: _0  (1.549 m)   Body mass index is 38.17 kg/m.  Advanced Directives 05/22/2021 05/11/2020 06/08/2019 12/16/2016 12/16/2016 06/24/2014 06/24/2014  Does Patient Have a Medical Advance Directive? _1  - No  Does patient want to make changes to medical advance directive? No - Patient declined - - - - - -  Would patient like information on creating a medical advance directive? - No - Patient declined - No - Patient declined - No - patient declined information No - patient declined information  Pre-existing out of facility DNR order (yellow form or pink MOST form) - - - - - - -    Current Medications (verified) Outpatient Encounter Medications as of 05/22/2021  Medication Sig   acetaminophen (TYLENOL) 500 MG tablet Take 500 mg by mouth every 6 (six) hours as needed for headache (pain).    albuterol (PROVENTIL) (2.5 MG/3ML) 0.083% nebulizer solution Take 3 mLs (2.5 mg total) by nebulization every 4 (four) hours as needed for wheezing or shortness of breath.   albuterol (VENTOLIN HFA) 108 (90 BASE) MCG/ACT inhaler Inhale 2 puffs into the lungs every 4 (four) hours as needed for wheezing or shortness of breath.   ALPRAZolam (XANAX) 0.5 MG tablet Take 1 tablet (0.5 mg total) by mouth 3 (three) times daily as needed.   amLODipine (NORVASC) 10 MG tablet Take 1 tablet (10 mg total) by mouth daily.   atenolol (TENORMIN) 50 MG tablet TAKE 1 TABLET BY MOUTH EVERY DAY   atorvastatin (LIPITOR) 20 MG tablet TAKE 1 TABLET (20 MG TOTAL) BY MOUTH DAILY. PLEASE KEEP UPCOMING APPT FOR FUTURE REFILLS.   azelastine (ASTELIN) 0.1 % nasal spray Place 1 spray into both nostrils at bedtime.   Blood Glucose Monitoring Suppl (ONETOUCH VERIO FLEX SYSTEM) w/Device KIT 1 each by  Does not apply route daily at 12 noon.   budesonide-formoterol (SYMBICORT) 160-4.5 MCG/ACT inhaler Inhale 2 puffs into the lungs 2 (two) times daily.   Cholecalciferol (VITAMIN D-3) 5000 UNITS TABS Take 5,000 Units by mouth daily.   clopidogrel (PLAVIX) 75 MG tablet Take 1 tablet (75 mg total) by mouth daily.   diclofenac sodium (VOLTAREN) 1 % GEL As needed for pain   diphenoxylate-atropine (LOMOTIL) 2.5-0.025 MG tablet TAKE 1 TABLET BY MOUTH TWICE DAILY AS NEEDED FOR DIARRHEA OR LOOSE STOOL   Ferrous Sulfate (IRON) 325 (65 Fe) MG TABS Take 1 tablet (325 mg total) by mouth 2 (two) times daily.   fluticasone (FLONASE) 50 MCG/ACT nasal spray PLACE 2 SPRAYS IN EACH NOSTRIL AT BEDTIME   furosemide (LASIX) 40 MG tablet Take 1 tablet (40 mg total) by mouth 2 (two) times daily.   gabapentin (NEURONTIN) 600 MG tablet Take 1 tablet (600 mg total) by mouth 3 (three) times daily.   glimepiride (AMARYL) 1 MG tablet Take 2 tablets (2 mg total) by mouth daily with breakfast.   glucose blood (ONETOUCH VERIO) test strip Use to check blood glucose once daily   glycopyrrolate (ROBINUL) 1 MG tablet TAKE 1 TABLET BY MOUTH TWICE A DAY   HYDROcodone-acetaminophen (NORCO) 10-325 MG tablet Take 1 tablet by mouth every 6 (six) hours as needed for moderate pain.   hydrOXYzine (VISTARIL) 25 MG  capsule TAKE 1 CAPSULE (25 MG TOTAL) BY MOUTH EVERY 6 (SIX) HOURS AS NEEDED.   isosorbide mononitrate (IMDUR) 60 MG 24 hr tablet Take 1 tablet (60 mg total) by mouth daily. Please keep upcoming appt for future refills.   KLOR-CON M20 20 MEQ tablet TAKE 1 TABLET BY MOUTH 3 TIMES DAILY. PLEASE KEEP UPCOMING APPT FOR FUTURE REFILLS.   levocetirizine (XYZAL) 5 MG tablet Take 5 mg by mouth every evening.   LIDODERM 5 % APPLY 1 PATCH TO SKIN FOR 12 HOURS THEN REMOVE AND DISCARD PATCH WITHIN 72 HOURS OR AS DIRECTED   loperamide (IMODIUM) 2 MG capsule Take 4-6 mg by mouth daily.   losartan (COZAAR) 50 MG tablet TAKE 1 TABLET BY MOUTH EVERY  DAY   magnesium oxide (MAG-OX) 400 MG tablet TAKE 2 TABLETS BY MOUTH DAILY.   mesalamine (LIALDA) 1.2 g EC tablet TAKE 2 TABLETS (2.4 G TOTAL) BY MOUTH 2 (TWO) TIMES DAILY.   metFORMIN (GLUCOPHAGE XR) 750 MG 24 hr tablet Take 1 tablet (750 mg total) by mouth daily with breakfast.   montelukast (SINGULAIR) 10 MG tablet Take one every morning   nabumetone (RELAFEN) 750 MG tablet TAKE 1 TABLET BY MOUTH TWICE A DAY WITH MEAL   neomycin-polymyxin-hydrocortisone (CORTISPORIN) OTIC solution Place 4 drops into both ears 4 (four) times daily.   nitroGLYCERIN (NITROSTAT) 0.4 MG SL tablet PLACE 1 TABLET (0.4 MG TOTAL) UNDER THE TONGUE EVERY 5 (FIVE) MINUTES AS NEEDED FOR CHEST PAIN.   Olopatadine HCl 0.7 % SOLN Place 1 drop into both eyes daily as needed (itching/allergies).   ondansetron (ZOFRAN-ODT) 4 MG disintegrating tablet TAKE 1 TABLET BY MOUTH EVERY 8 HOURS AS NEEDED FOR NAUSEA AND VOMITING   OneTouch Delica Lancets 50D MISC 1 each by Does not apply route daily at 12 noon.   Pancrelipase, Lip-Prot-Amyl, (ZENPEP) 20000-63000 units CPEP TAKE 2 CAPSULES (40,000 UNITS) WITH MEALS AND 1 CAPSULE (20,000 UNITS) WITH A SNACK   pantoprazole (PROTONIX) 40 MG tablet Take 1 tablet (40 mg total) by mouth 2 (two) times daily.   Polyethyl Glycol-Propyl Glycol (SYSTANE OP) Place 1 drop into both eyes daily. For dry eyes   tiZANidine (ZANAFLEX) 4 MG tablet TAKE 1 TABLET BY MOUTH TWICE A DAY   Facility-Administered Encounter Medications as of 05/22/2021  Medication   ipratropium-albuterol (DUONEB) 0.5-2.5 (3) MG/3ML nebulizer solution 3 mL    Allergies (verified) Doxycycline, Aspirin, Azithromycin, Caffeine, Ciprofloxacin, Codeine, Oxycodone, Sulfamethizole, and Tramadol hcl   History: Past Medical History:  Diagnosis Date   Adenomatous colon polyp 02/2006   Allergy    Allergy, unspecified not elsewhere classified    Anemia    Anxiety    Arthritis    Asthma    Blood transfusion without reported diagnosis     Bronchiectasis    CAD (coronary artery disease)    BMS to the LAD, 2002; cath 12/07/11 patent LAD stent and mild nonobstructive disease, EF 65%; Medical management   Cataract    removed both eyes   CHF (congestive heart failure) (HCC)    Diastolic   Clotting disorder (HCC)    in legs per pt    Diabetes mellitus    Diverticulosis of colon    DJD (degenerative joint disease)    Fibromyalgia    GERD (gastroesophageal reflux disease)    Hypercholesterolemia    Hypertension    Microscopic hematuria    Neoplasm of kidney    Obesity    Other diseases of lung, not elsewhere classified  Pinched vertebral nerve    Pulmonary nodule    Negative PET in 2010   Stress incontinence, female    Syncope    Ulcerative colitis, left sided ([place]) 2008   Hx of   UTI (lower urinary tract infection)    Vitamin D deficiency disease    Past Surgical History:  Procedure Laterality Date   ABDOMINAL HYSTERECTOMY     CARDIAC CATHETERIZATION  11/2011   CATARACT EXTRACTION, BILATERAL  2020   CHOLECYSTECTOMY     COLONOSCOPY     COLONOSCOPY W/ BIOPSIES  11-12-12   per Dr. Fuller Plan, diverticulosis only, repeat in 5 yrs   CORONARY STENT PLACEMENT  2002   BMS to the LAD   ESOPHAGOGASTRODUODENOSCOPY  12/2008   HAND SURGERY  01/2006   Right Hand Surgery by Dr. Amedeo Plenty   LEFT HEART CATHETERIZATION WITH CORONARY ANGIOGRAM N/A 12/07/2011   Procedure: LEFT HEART CATHETERIZATION WITH CORONARY ANGIOGRAM;  Surgeon: Peter M Martinique, MD;  Location: [facility] CATH LAB;  Service: Cardiovascular;  Laterality: N/A;   POLYPECTOMY     UPPER GASTROINTESTINAL ENDOSCOPY     Family History  Problem Relation Age of Onset   Pneumonia Father    Heart attack Father    Colon polyps Father    Parkinsonism Mother    Diabetes Brother    Sarcoidosis Brother    Hypertension Sister    Diabetes Sister    Breast cancer Daughter    Colon cancer Neg Hx    Esophageal cancer Neg Hx    Rectal cancer Neg Hx    Stomach cancer Neg Hx    Social  History   Socioeconomic History   Marital status: Divorced    Spouse name: Not on file   Number of children: 3   Years of education: Not on file   Highest education level: Not on file  Occupational History   Occupation: Retired    Fish farm manager: RETIRED  Tobacco Use   Smoking status: Former    Packs/day: 2.00    Years: 15.00    Pack years: 30.00    Types: Cigarettes    Quit date: 04/30/1973    Years since quitting: 48.0   Smokeless tobacco: Never   Tobacco comments:    ex-smoker: smoked for 10-15 years up to 2ppd, quit 1975.  Vaping Use   Vaping Use: Never used  Substance and Sexual Activity   Alcohol use: Not Currently    Alcohol/week: 0.0 standard drinks    Comment: none now    Drug use: No   Sexual activity: Never  Other Topics Concern   Not on file  Social History Narrative   Right handed   Social Determinants of Health   Financial Resource Strain: Low Risk    Difficulty of Paying Living Expenses: Not hard at all  Food Insecurity: No Food Insecurity   Worried About Charity fundraiser in the Last Year: Never true   Ran Out of Food in the Last Year: Never true  Transportation Needs: No Transportation Needs   Lack of Transportation (Medical): No   Lack of Transportation (Non-Medical): No  Physical Activity: Inactive   Days of Exercise per Week: 0 days   Minutes of Exercise per Session: 0 min  Stress: No Stress Concern Present   Feeling of Stress : Not at all  Social Connections: Moderately Integrated   Frequency of Communication with Friends and Family: Three times a week   Frequency of Social Gatherings with Friends and Family: Three times a  week   Attends Religious Services: More than 4 times per year   Active Member of Clubs or Organizations: Yes   Attends Archivist Meetings: More than 4 times per year   Marital Status: Widowed    Clinical Intake: Nutrition Risk Assessment:  Has the patient had any N/V/D within the last 2 months?  No  Does the  patient have any non-healing wounds?  No  Has the patient had any unintentional weight loss or weight gain?  No   Diabetes:  Is the patient diabetic?  Yes  If diabetic, was a CBG obtained today?  No  Audio Visit Did the patient bring in their glucometer from home?  No Audio Visit How often do you monitor your CBG's? Daily.   Financial Strains and Diabetes Management:  Are you having any financial strains with the device, your supplies or your medication? No .  Does the patient want to be seen by Chronic Care Management for management of their diabetes?  No  Would the patient like to be referred to a Nutritionist or for Diabetic Management?  No Followed by PCP  Diabetic Exams:  Diabetic Eye Exam: Completed Yes.    Diabetic Foot Exam: Completed Yes. Pt has been advised about the importance in completing this exam. Pt is scheduled for diabetic foot exam on Followed by PCP.   Pre-visit preparation completed: Yes  Activities of Daily Living In your present state of health, do you have any difficulty performing the following activities: 05/22/2021  Hearing? N  Vision? N  Difficulty concentrating or making decisions? N  Walking or climbing stairs? N  Dressing or bathing? N  Doing errands, shopping? N  Preparing Food and eating ? N  Using the Toilet? N  In the past six months, have you accidently leaked urine? N  Do you have problems with loss of bowel control? N  Managing your Medications? N  Managing your Finances? N  Housekeeping or managing your Housekeeping? N  Some recent data might be hidden    Patient Care Team: [name], MD as PCP - General (Family Medicine) Jerline Pain, MD as PCP - Cardiology (Cardiology) Pieter Partridge, DO as Consulting Physician (Neurology) Viona Gilmore, [facility] as Pharmacist (Pharmacist)  Indicate any recent Medical Services you may have received from other than [facility] providers in the past year (date may be approximate).     Assessment:    This is a routine wellness examination for Kailene.  Virtual Visit via Telephone Note  I connected with  [name] on 05/22/21 at  9:00 AM EST by telephone and verified that I am speaking with the correct person using two identifiers.  Location: Patient: Home Provider: Office Persons participating in the virtual visit: patient/Nurse Health Advisor   I discussed the limitations, risks, security and privacy concerns of performing an evaluation and management service by telephone and the availability of in person appointments. The patient expressed understanding and agreed to proceed.  Interactive audio and video telecommunications were attempted between this nurse and patient, however failed, due to patient having technical difficulties OR patient did not have access to video capability.  We continued and completed visit with audio only.  Some vital signs may be absent or patient reported.   Criselda Peaches, LPN   Hearing/Vision screen Hearing Screening - Comments:: No hearing difficulty Vision Screening - Comments:: Wears reading glasses. Followed by [facility]  Dietary issues and exercise activities discussed: Current Exercise Habits: The patient does  not participate in regular exercise at present, Exercise limited by: None identified   Goals Addressed             This Visit's Progress    Patient Stated       I would like to walk better so I don't have to use my walker.        Depression Screen PHQ 2/9 Scores 05/22/2021 05/11/2020 02/06/2018 05/25/2014  PHQ - 2 Score 0 0 0 0  PHQ- 9 Score - 0 - -    Fall Risk Fall Risk  05/22/2021 05/11/2020 06/08/2019 05/12/2019 10/28/2018  Falls in the past year? 0 0 _0 Comment - - - - -  Number falls in past yr: 0 0 1 0 1  Injury with Fall? 0 0 0 1 0  Risk for fall due to : - Impaired balance/gait;Impaired mobility;Medication side effect - History of fall(s);Impaired balance/gait;Impaired mobility;Other (Comment) Impaired  balance/gait;Impaired mobility  Risk for fall due to: Comment - - - diarrhea -  Follow up - Falls evaluation completed;Falls prevention discussed - Falls evaluation completed -    FALL RISK PREVENTION PERTAINING TO THE HOME:  Any stairs in or around the home? Yes  If so, are there any without handrails? No  Home free of loose throw rugs in walkways, pet beds, electrical cords, etc? Yes  Adequate lighting in your home to reduce risk of falls? Yes   ASSISTIVE DEVICES UTILIZED TO PREVENT FALLS:  Life alert? No  Use of a cane, walker or w/c? Yes  Grab bars in the bathroom? Yes  Shower chair or bench in shower? Yes  Elevated toilet seat or a handicapped toilet? Yes   TIMED UP AND GO:  Was the test performed? No . Audio Visit  Cognitive Function:     6CIT Screen 05/22/2021 05/11/2020  What Year? 0 points 0 points  What month? 0 points 0 points  What time? 0 points 0 points  Count back from 20 0 points 0 points  Months in reverse 0 points 2 points  Repeat phrase 0 points 4 points  Total Score 0 6    Immunizations Immunization History  Administered Date(s) Administered   Influenza, High Dose Seasonal PF 02/17/2019   PFIZER(Purple Top)SARS-COV-2 Vaccination 03/17/2020, 04/09/2020   Pneumococcal Conjugate-13 03/15/2015   Pneumococcal Polysaccharide-23 05/21/2006, 02/23/2015, 05/21/2016, 08/14/2018, 02/17/2019, 03/17/2020    TDAP status: Due, Education has been provided regarding the importance of this vaccine. Advised may receive this vaccine at local pharmacy or Health Dept. Aware to provide a copy of the vaccination record if obtained from local pharmacy or Health Dept. Verbalized acceptance and understanding.   Covid-19 vaccine status: Completed vaccines  Qualifies for Shingles Vaccine? Yes   Zostavax completed No   Shingrix Completed?: No.    Education has been provided regarding the importance of this vaccine. Patient has been advised to call insurance company to  determine out of pocket expense if they have not yet received this vaccine. Advised may also receive vaccine at local pharmacy or Health Dept. Verbalized acceptance and understanding.  Screening Tests Health Maintenance  Topic Date Due   FOOT EXAM  07/28/2019   OPHTHALMOLOGY EXAM  05/22/2021 (Originally 08/19/2020)   COVID-19 Vaccine (3 - Booster for Pfizer series) 06/07/2021 (Originally 06/04/2020)   HEMOGLOBIN A1C  06/22/2021 (Originally 01/18/2021)   Zoster Vaccines- Shingrix (1 of 2) 08/20/2021 (Originally 07/23/1992)   COLONOSCOPY (Pts 45-42yr Insurance coverage will need to be confirmed)  05/22/2022 (Originally 01/09/2021)  TETANUS/TDAP  05/22/2022 (Originally 07/23/1961)   Hepatitis C Screening  05/22/2022 (Originally 07/23/1960)   Pneumonia Vaccine 4+ Years old  Completed   DEXA SCAN  Completed   HPV VACCINES  Aged Out   INFLUENZA VACCINE  Discontinued    Health Maintenance  Health Maintenance Due  Topic Date Due   FOOT EXAM  07/28/2019    Colonoscopy: Patient Deferred   Additional Screening:  Hepatitis C Screening: does qualify; Completed Patient deferred  Vision Screening: Recommended annual ophthalmology exams for early detection of glaucoma and other disorders of the eye. Is the patient up to date with their annual eye exam?  Yes  Who is the provider or what is the name of the office in which the patient attends annual eye exams? Followed by Dr Newman Nip  Dental Screening: Recommended annual dental exams for proper oral hygiene  Community Resource Referral / Chronic Care Management:  CRR required this visit?  No   CCM required this visit?  No      Plan:     I have personally reviewed and noted the following in the patients chart:   Medical and social history Use of alcohol, tobacco or illicit drugs Patient currently taking opioids Current medications and supplements including opioid prescriptions.  Functional ability and status Nutritional  status Physical activity Advanced directives List of other physicians Hospitalizations, surgeries, and ER visits in previous 12 months Vitals Screenings to include cognitive, depression, and falls Referrals and appointments  In addition, I have reviewed and discussed with patient certain preventive protocols, quality metrics, and best practice recommendations. A written personalized care plan for preventive services as well as general preventive health recommendations were provided to patient.     Criselda Peaches, LPN   0/92/3300

## 2021-05-29 ENCOUNTER — Telehealth: Payer: Self-pay

## 2021-05-29 NOTE — Telephone Encounter (Signed)
Initiated PA for ondansetron 4 mg tablets with healthteam advantage on cover my meds. PA coverage was approved through 04/29/22.

## 2021-06-07 ENCOUNTER — Other Ambulatory Visit: Payer: Self-pay | Admitting: Cardiology

## 2021-06-16 ENCOUNTER — Encounter: Payer: Self-pay | Admitting: Family Medicine

## 2021-06-16 ENCOUNTER — Ambulatory Visit (INDEPENDENT_AMBULATORY_CARE_PROVIDER_SITE_OTHER): Payer: PPO | Admitting: Family Medicine

## 2021-06-16 VITALS — BP 120/70 | HR 83 | Temp 98.3°F | Ht 61.0 in | Wt 214.0 lb

## 2021-06-16 DIAGNOSIS — Z0000 Encounter for general adult medical examination without abnormal findings: Secondary | ICD-10-CM

## 2021-06-16 LAB — BASIC METABOLIC PANEL
BUN: 16 mg/dL (ref 6–23)
CO2: 33 mEq/L — ABNORMAL HIGH (ref 19–32)
Calcium: 9.8 mg/dL (ref 8.4–10.5)
Chloride: 99 mEq/L (ref 96–112)
Creatinine, Ser: 0.67 mg/dL (ref 0.40–1.20)
GFR: 83.44 mL/min (ref >60.00)
Glucose, Bld: 147 mg/dL — ABNORMAL HIGH (ref 70–99)
Potassium: 4.2 mEq/L (ref 3.5–5.1)
Sodium: 140 mEq/L (ref 135–145)

## 2021-06-16 LAB — CBC WITH DIFFERENTIAL/PLATELET
Basophils Absolute: 0 10*3/uL (ref 0.0–0.1)
Basophils Relative: 0.4 % (ref 0.0–3.0)
Eosinophils Absolute: 0.4 10*3/uL (ref 0.0–0.7)
Eosinophils Relative: 4.8 % (ref 0.0–5.0)
HCT: 37.3 % (ref 36.0–46.0)
Hemoglobin: 12.1 g/dL (ref 12.0–15.0)
Lymphocytes Relative: 14.5 % (ref 12.0–46.0)
Lymphs Abs: 1.3 10*3/uL (ref 0.7–4.0)
MCHC: 32.4 g/dL (ref 30.0–36.0)
MCV: 92.2 fl (ref 78.0–100.0)
Monocytes Absolute: 0.5 10*3/uL (ref 0.1–1.0)
Monocytes Relative: 6 % (ref 3.0–12.0)
Neutro Abs: 6.7 10*3/uL (ref 1.4–7.7)
Neutrophils Relative %: 74.3 % (ref 43.0–77.0)
Platelets: 424 10*3/uL — ABNORMAL HIGH (ref 150.0–400.0)
RBC: 4.04 Mil/uL (ref 3.87–5.11)
RDW: 14.3 % (ref 11.5–15.5)
WBC: 9.1 10*3/uL (ref 4.0–10.5)

## 2021-06-16 LAB — LIPID PANEL
Cholesterol: 146 mg/dL (ref 0–200)
HDL: 58.6 mg/dL (ref >39.00)
LDL Cholesterol: 66 mg/dL (ref 0–99)
NonHDL: 86.97
Total CHOL/HDL Ratio: 2
Triglycerides: 104 mg/dL (ref 0.0–149.0)
VLDL: 20.8 mg/dL (ref 0.0–40.0)

## 2021-06-16 LAB — MAGNESIUM: Magnesium: 2 mg/dL (ref 1.5–2.5)

## 2021-06-16 LAB — HEPATIC FUNCTION PANEL
ALT: 5 U/L (ref 0–35)
AST: 14 U/L (ref 0–37)
Albumin: 4.6 g/dL (ref 3.5–5.2)
Alkaline Phosphatase: 58 U/L (ref 39–117)
Bilirubin, Direct: 0.1 mg/dL (ref 0.0–0.3)
Total Bilirubin: 0.5 mg/dL (ref 0.2–1.2)
Total Protein: 7.6 g/dL (ref 6.0–8.3)

## 2021-06-16 LAB — HEMOGLOBIN A1C: Hgb A1c MFr Bld: 7.8 % — ABNORMAL HIGH (ref 4.6–6.5)

## 2021-06-16 LAB — TSH: TSH: 1.27 u[IU]/mL (ref 0.35–5.50)

## 2021-06-16 MED ORDER — GLIMEPIRIDE 1 MG PO TABS: 2.0000 mg | ORAL_TABLET | Freq: Every day | ORAL | 3 refills | Status: DC

## 2021-06-16 MED ORDER — FUROSEMIDE 40 MG PO TABS: 40.0000 mg | ORAL_TABLET | Freq: Two times a day (BID) | ORAL | 3 refills | Status: DC

## 2021-06-16 NOTE — Progress Notes (Signed)
Subjective:    Patient ID: [name], female    DOB: [birth year], 81 y.o.   MRN: [id number]  HPI Here for a well exam. She feels well except for her back pain. She is scheduled for an epidural steroid injection sometime soon.    Review of Systems  Constitutional: Negative.   HENT: Negative.    Eyes: Negative.   Respiratory: Negative.    Cardiovascular: Negative.   Gastrointestinal: Negative.   Genitourinary:  Negative for decreased urine volume, difficulty urinating, dyspareunia, dysuria, enuresis, flank pain, frequency, hematuria, pelvic pain and urgency.  Musculoskeletal:  Positive for back pain.  Skin: Negative.   Neurological: Negative.  Negative for headaches.  Psychiatric/Behavioral: Negative.        Objective:   Physical Exam Constitutional:      General: She is not in acute distress.    Appearance: She is well-developed. She is obese.     Comments: Uses a rolling walker   HENT:     Head: Normocephalic and atraumatic.     Right Ear: External ear normal.     Left Ear: External ear normal.     Nose: Nose normal.     Mouth/Throat:     Pharynx: No oropharyngeal exudate.  Eyes:     General: No scleral icterus.    Conjunctiva/sclera: Conjunctivae normal.     Pupils: Pupils are equal, round, and reactive to light.  Neck:     Thyroid: No thyromegaly.     Vascular: No JVD.  Cardiovascular:     Rate and Rhythm: Normal rate and regular rhythm.     Heart sounds: Normal heart sounds. No murmur heard.   No friction rub. No gallop.  Pulmonary:     Effort: Pulmonary effort is normal. No respiratory distress.     Breath sounds: Normal breath sounds. No wheezing or rales.  Chest:     Chest wall: No tenderness.  Abdominal:     General: Bowel sounds are normal. There is no distension.     Palpations: Abdomen is soft. There is no mass.     Tenderness: There is no abdominal tenderness. There is no guarding or rebound.  Musculoskeletal:        General: No tenderness.  Normal range of motion.     Cervical back: Normal range of motion and neck supple.     Comments: 3+ edema in the left lower leg and 2+ edema in the right lower leg   Lymphadenopathy:     Cervical: No cervical adenopathy.  Skin:    General: Skin is warm and dry.     Findings: No erythema or rash.  Neurological:     Mental Status: She is alert and oriented to person, place, and time.     Cranial Nerves: No cranial nerve deficit.     Motor: No abnormal muscle tone.     Coordination: Coordination normal.     Deep Tendon Reflexes: Reflexes are normal and symmetric. Reflexes normal.  Psychiatric:        Behavior: Behavior normal.        Thought Content: Thought content normal.        Judgment: Judgment normal.          Assessment & Plan:  Well exam. We discussed diet and exercise. Get fasting labs. We asked again about whether she can drive a car by herself, and I again told her that she should avoid driving for a number of reasons.  Alysia Penna, MD

## 2021-06-17 ENCOUNTER — Other Ambulatory Visit: Payer: Self-pay | Admitting: Family Medicine

## 2021-06-19 ENCOUNTER — Other Ambulatory Visit: Payer: Self-pay

## 2021-06-19 DIAGNOSIS — E119 Type 2 diabetes mellitus without complications: Secondary | ICD-10-CM

## 2021-06-19 DIAGNOSIS — I10 Essential (primary) hypertension: Secondary | ICD-10-CM

## 2021-06-19 MED ORDER — SITAGLIPTIN PHOSPHATE 100 MG PO TABS: 100.0000 mg | ORAL_TABLET | Freq: Every day | ORAL | 3 refills | Status: DC

## 2021-06-26 DIAGNOSIS — M5416 Radiculopathy, lumbar region: Secondary | ICD-10-CM | POA: Diagnosis not present

## 2021-06-27 DIAGNOSIS — H18413 Arcus senilis, bilateral: Secondary | ICD-10-CM | POA: Diagnosis not present

## 2021-06-27 DIAGNOSIS — H26493 Other secondary cataract, bilateral: Secondary | ICD-10-CM | POA: Diagnosis not present

## 2021-06-27 DIAGNOSIS — Z961 Presence of intraocular lens: Secondary | ICD-10-CM | POA: Diagnosis not present

## 2021-06-27 DIAGNOSIS — H26492 Other secondary cataract, left eye: Secondary | ICD-10-CM | POA: Diagnosis not present

## 2021-06-28 ENCOUNTER — Telehealth (INDEPENDENT_AMBULATORY_CARE_PROVIDER_SITE_OTHER): Payer: PPO | Admitting: Family Medicine

## 2021-06-28 ENCOUNTER — Encounter: Payer: Self-pay | Admitting: Family Medicine

## 2021-06-28 DIAGNOSIS — M5442 Lumbago with sciatica, left side: Secondary | ICD-10-CM | POA: Diagnosis not present

## 2021-06-28 DIAGNOSIS — M5441 Lumbago with sciatica, right side: Secondary | ICD-10-CM

## 2021-06-28 DIAGNOSIS — F1190 Opioid use, unspecified, uncomplicated: Secondary | ICD-10-CM | POA: Diagnosis not present

## 2021-06-28 DIAGNOSIS — G8929 Other chronic pain: Secondary | ICD-10-CM | POA: Diagnosis not present

## 2021-06-28 MED ORDER — HYDROCODONE-ACETAMINOPHEN 10-325 MG PO TABS: 1.0000 | ORAL_TABLET | Freq: Four times a day (QID) | ORAL | 0 refills | Status: DC | PRN

## 2021-06-28 MED ORDER — HYDROCODONE-ACETAMINOPHEN 10-325 MG PO TABS
1.0000 | ORAL_TABLET | Freq: Four times a day (QID) | ORAL | 0 refills | Status: AC | PRN
Start: 2021-08-28 — End: 2021-09-27

## 2021-06-28 MED ORDER — HYDROCODONE-ACETAMINOPHEN 10-325 MG PO TABS
1.0000 | ORAL_TABLET | Freq: Four times a day (QID) | ORAL | 0 refills | Status: DC | PRN
Start: 2021-07-29 — End: 2021-06-28

## 2021-06-28 NOTE — Progress Notes (Signed)
? ?  Subjective:  ? ? Patient ID: [name], female    DOB: 1941/01/01, 81 y.o.   MRN: [id number] ? ?HPI ?Virtual Visit via Telephone Note ? ?I connected with the patient on 06/28/21 at  1:45 PM EST by telephone and verified that I am speaking with the correct person using two identifiers. ?  ?I discussed the limitations, risks, security and privacy concerns of performing an evaluation and management service by telephone and the availability of in person appointments. I also discussed with the patient that there may be a patient responsible charge related to this service. The patient expressed understanding and agreed to proceed. ? ?Location patient: home ?Location provider: work or home office ?Participants present for the call: patient, provider ?Patient did not have a visit in the prior 7 days to address this/these issue(s). ? ? ?History of Present Illness: ?Here for pain management. She is doing about like usual. We saw her recently for a well exam, and she was walking normally. Her low back was tender and her ROM was limited by pain. She received another right sided epidural steroid injection 2 days ago, but it is too early to tell if this will help.                                                                                 ?  ?Observations/Objective: ?Patient sounds cheerful and well on the phone. ?I do not appreciate any SOB. ?Speech and thought processing are grossly intact. ?Patient reported vitals: ? ?Assessment and Plan: ?Pain management.  ?Indication for chronic opioid: low back pain ?Medication and dose: Norco 10-325 ?# pills per month: 120 ?Last UDS date: 03-10-21 ?Opioid Treatment Agreement signed (Y/N): 10-28-18 ?Opioid Treatment Agreement last reviewed with patient:  06-28-21 ?NCCSRS reviewed this encounter (include red flags): Yes ?Meds were refilled.  ?Alysia Penna, MD ? ? ?Follow Up Instructions: ? ? ? ? ?62130 5-10 ?99442 11-20 ?9443 21-30 ?I did not refer this patient for an OV in the next 24  hours for this/these issue(s). ? ?I discussed the assessment and treatment plan with the patient. The patient was provided an opportunity to ask questions and all were answered. The patient agreed with the plan and demonstrated an understanding of the instructions. ?  ?The patient was advised to call back or seek an in-person evaluation if the symptoms worsen or if the condition fails to improve as anticipated. ? ?I provided 18 minutes of non-face-to-face time during this encounter. ? ? ?Alysia Penna, MD   ? ? ?Review of Systems ? ?   ?Objective:  ? Physical Exam ? ? ? ? ?   ?Assessment & Plan:  ? ? ?

## 2021-06-29 ENCOUNTER — Other Ambulatory Visit: Payer: Self-pay | Admitting: Family Medicine

## 2021-07-05 ENCOUNTER — Other Ambulatory Visit: Payer: Self-pay | Admitting: Gastroenterology

## 2021-07-18 DIAGNOSIS — M47812 Spondylosis without myelopathy or radiculopathy, cervical region: Secondary | ICD-10-CM | POA: Diagnosis not present

## 2021-07-19 ENCOUNTER — Telehealth: Payer: Self-pay | Admitting: Family Medicine

## 2021-07-19 DIAGNOSIS — M159 Polyosteoarthritis, unspecified: Secondary | ICD-10-CM

## 2021-07-19 NOTE — Telephone Encounter (Signed)
Pt call and stated she have been calling  the pharmacy for a week and they stated it was not sent in pt stated she need a refill on nabumetone (RELAFEN) 750 MG tablet  sent to  [id number] [place] Phone:  [phone] ?Fax:  [phone] ?  ? ?

## 2021-07-20 ENCOUNTER — Other Ambulatory Visit: Payer: Self-pay | Admitting: Family Medicine

## 2021-07-20 DIAGNOSIS — H26491 Other secondary cataract, right eye: Secondary | ICD-10-CM | POA: Diagnosis not present

## 2021-07-20 MED ORDER — NABUMETONE 750 MG PO TABS: ORAL_TABLET | ORAL | 1 refills | Status: DC

## 2021-07-20 NOTE — Telephone Encounter (Signed)
Requested refill [facility] to [facility] on [address].    Called patient to make aware. ?

## 2021-07-23 ENCOUNTER — Other Ambulatory Visit: Payer: Self-pay | Admitting: Gastroenterology

## 2021-07-23 ENCOUNTER — Other Ambulatory Visit: Payer: Self-pay | Admitting: Cardiology

## 2021-07-24 DIAGNOSIS — M542 Cervicalgia: Secondary | ICD-10-CM | POA: Diagnosis not present

## 2021-08-01 ENCOUNTER — Telehealth: Payer: PPO | Admitting: Family Medicine

## 2021-08-03 ENCOUNTER — Emergency Department (HOSPITAL_COMMUNITY): Payer: PPO

## 2021-08-03 ENCOUNTER — Inpatient Hospital Stay (HOSPITAL_COMMUNITY): Payer: PPO

## 2021-08-03 ENCOUNTER — Inpatient Hospital Stay (HOSPITAL_COMMUNITY)
Admission: EM | Admit: 2021-08-03 | Discharge: 2021-08-11 | DRG: 871 | Disposition: A | Discharge status: Home Health | Payer: PPO | Attending: Family Medicine | Admitting: Family Medicine

## 2021-08-03 ENCOUNTER — Other Ambulatory Visit: Payer: Self-pay

## 2021-08-03 ENCOUNTER — Encounter (HOSPITAL_COMMUNITY): Payer: Self-pay | Admitting: Emergency Medicine

## 2021-08-03 DIAGNOSIS — N179 Acute kidney failure, unspecified: Secondary | ICD-10-CM | POA: Diagnosis not present

## 2021-08-03 DIAGNOSIS — R4182 Altered mental status, unspecified: Secondary | ICD-10-CM | POA: Diagnosis not present

## 2021-08-03 DIAGNOSIS — Z79891 Long term (current) use of opiate analgesic: Secondary | ICD-10-CM | POA: Diagnosis not present

## 2021-08-03 DIAGNOSIS — E1165 Type 2 diabetes mellitus with hyperglycemia: Secondary | ICD-10-CM | POA: Diagnosis present

## 2021-08-03 DIAGNOSIS — D72829 Elevated white blood cell count, unspecified: Secondary | ICD-10-CM | POA: Diagnosis present

## 2021-08-03 DIAGNOSIS — R4020 Unspecified coma: Secondary | ICD-10-CM | POA: Diagnosis not present

## 2021-08-03 DIAGNOSIS — K861 Other chronic pancreatitis: Secondary | ICD-10-CM | POA: Diagnosis not present

## 2021-08-03 DIAGNOSIS — R109 Unspecified abdominal pain: Secondary | ICD-10-CM | POA: Diagnosis not present

## 2021-08-03 DIAGNOSIS — E8729 Other acidosis: Secondary | ICD-10-CM | POA: Diagnosis not present

## 2021-08-03 DIAGNOSIS — Z79899 Other long term (current) drug therapy: Secondary | ICD-10-CM

## 2021-08-03 DIAGNOSIS — K7200 Acute and subacute hepatic failure without coma: Secondary | ICD-10-CM | POA: Diagnosis present

## 2021-08-03 DIAGNOSIS — Z8371 Family history of colonic polyps: Secondary | ICD-10-CM

## 2021-08-03 DIAGNOSIS — J9690 Respiratory failure, unspecified, unspecified whether with hypoxia or hypercapnia: Secondary | ICD-10-CM | POA: Diagnosis not present

## 2021-08-03 DIAGNOSIS — Z86010 Personal history of colonic polyps: Secondary | ICD-10-CM

## 2021-08-03 DIAGNOSIS — I4891 Unspecified atrial fibrillation: Secondary | ICD-10-CM | POA: Diagnosis not present

## 2021-08-03 DIAGNOSIS — M5412 Radiculopathy, cervical region: Principal | ICD-10-CM

## 2021-08-03 DIAGNOSIS — E872 Acidosis, unspecified: Secondary | ICD-10-CM | POA: Diagnosis present

## 2021-08-03 DIAGNOSIS — M797 Fibromyalgia: Secondary | ICD-10-CM | POA: Diagnosis present

## 2021-08-03 DIAGNOSIS — Z90710 Acquired absence of both cervix and uterus: Secondary | ICD-10-CM

## 2021-08-03 DIAGNOSIS — E785 Hyperlipidemia, unspecified: Secondary | ICD-10-CM | POA: Diagnosis present

## 2021-08-03 DIAGNOSIS — Z20822 Contact with and (suspected) exposure to covid-19: Secondary | ICD-10-CM | POA: Diagnosis present

## 2021-08-03 DIAGNOSIS — I2723 Pulmonary hypertension due to lung diseases and hypoxia: Secondary | ICD-10-CM | POA: Diagnosis present

## 2021-08-03 DIAGNOSIS — J9811 Atelectasis: Secondary | ICD-10-CM | POA: Diagnosis present

## 2021-08-03 DIAGNOSIS — Z7984 Long term (current) use of oral hypoglycemic drugs: Secondary | ICD-10-CM

## 2021-08-03 DIAGNOSIS — J90 Pleural effusion, not elsewhere classified: Secondary | ICD-10-CM | POA: Diagnosis not present

## 2021-08-03 DIAGNOSIS — E559 Vitamin D deficiency, unspecified: Secondary | ICD-10-CM | POA: Diagnosis present

## 2021-08-03 DIAGNOSIS — Z9841 Cataract extraction status, right eye: Secondary | ICD-10-CM

## 2021-08-03 DIAGNOSIS — I071 Rheumatic tricuspid insufficiency: Secondary | ICD-10-CM | POA: Diagnosis not present

## 2021-08-03 DIAGNOSIS — I2721 Secondary pulmonary arterial hypertension: Secondary | ICD-10-CM | POA: Diagnosis not present

## 2021-08-03 DIAGNOSIS — I5021 Acute systolic (congestive) heart failure: Secondary | ICD-10-CM | POA: Diagnosis not present

## 2021-08-03 DIAGNOSIS — I2510 Atherosclerotic heart disease of native coronary artery without angina pectoris: Secondary | ICD-10-CM | POA: Diagnosis present

## 2021-08-03 DIAGNOSIS — Z886 Allergy status to analgesic agent status: Secondary | ICD-10-CM

## 2021-08-03 DIAGNOSIS — M4722 Other spondylosis with radiculopathy, cervical region: Secondary | ICD-10-CM | POA: Diagnosis not present

## 2021-08-03 DIAGNOSIS — J9601 Acute respiratory failure with hypoxia: Secondary | ICD-10-CM

## 2021-08-03 DIAGNOSIS — I5043 Acute on chronic combined systolic (congestive) and diastolic (congestive) heart failure: Secondary | ICD-10-CM | POA: Diagnosis not present

## 2021-08-03 DIAGNOSIS — Z7951 Long term (current) use of inhaled steroids: Secondary | ICD-10-CM

## 2021-08-03 DIAGNOSIS — Z791 Long term (current) use of non-steroidal anti-inflammatories (NSAID): Secondary | ICD-10-CM

## 2021-08-03 DIAGNOSIS — I493 Ventricular premature depolarization: Secondary | ICD-10-CM | POA: Diagnosis present

## 2021-08-03 DIAGNOSIS — Z87440 Personal history of urinary (tract) infections: Secondary | ICD-10-CM

## 2021-08-03 DIAGNOSIS — R937 Abnormal findings on diagnostic imaging of other parts of musculoskeletal system: Secondary | ICD-10-CM

## 2021-08-03 DIAGNOSIS — I10 Essential (primary) hypertension: Secondary | ICD-10-CM | POA: Diagnosis not present

## 2021-08-03 DIAGNOSIS — I517 Cardiomegaly: Secondary | ICD-10-CM | POA: Diagnosis not present

## 2021-08-03 DIAGNOSIS — I110 Hypertensive heart disease with heart failure: Secondary | ICD-10-CM | POA: Diagnosis present

## 2021-08-03 DIAGNOSIS — Z452 Encounter for adjustment and management of vascular access device: Secondary | ICD-10-CM | POA: Diagnosis not present

## 2021-08-03 DIAGNOSIS — Z6841 Body Mass Index (BMI) 40.0 and over, adult: Secondary | ICD-10-CM

## 2021-08-03 DIAGNOSIS — J45909 Unspecified asthma, uncomplicated: Secondary | ICD-10-CM | POA: Diagnosis present

## 2021-08-03 DIAGNOSIS — K5190 Ulcerative colitis, unspecified, without complications: Secondary | ICD-10-CM | POA: Diagnosis present

## 2021-08-03 DIAGNOSIS — E875 Hyperkalemia: Secondary | ICD-10-CM

## 2021-08-03 DIAGNOSIS — J189 Pneumonia, unspecified organism: Secondary | ICD-10-CM | POA: Diagnosis present

## 2021-08-03 DIAGNOSIS — R579 Shock, unspecified: Secondary | ICD-10-CM

## 2021-08-03 DIAGNOSIS — Z87891 Personal history of nicotine dependence: Secondary | ICD-10-CM

## 2021-08-03 DIAGNOSIS — Z885 Allergy status to narcotic agent status: Secondary | ICD-10-CM

## 2021-08-03 DIAGNOSIS — I509 Heart failure, unspecified: Secondary | ICD-10-CM | POA: Diagnosis not present

## 2021-08-03 DIAGNOSIS — E119 Type 2 diabetes mellitus without complications: Secondary | ICD-10-CM

## 2021-08-03 DIAGNOSIS — Z7902 Long term (current) use of antithrombotics/antiplatelets: Secondary | ICD-10-CM

## 2021-08-03 DIAGNOSIS — F419 Anxiety disorder, unspecified: Secondary | ICD-10-CM | POA: Diagnosis present

## 2021-08-03 DIAGNOSIS — R0902 Hypoxemia: Secondary | ICD-10-CM | POA: Diagnosis not present

## 2021-08-03 DIAGNOSIS — I499 Cardiac arrhythmia, unspecified: Secondary | ICD-10-CM | POA: Diagnosis not present

## 2021-08-03 DIAGNOSIS — G510 Bell's palsy: Secondary | ICD-10-CM | POA: Diagnosis present

## 2021-08-03 DIAGNOSIS — D75838 Other thrombocytosis: Secondary | ICD-10-CM | POA: Diagnosis present

## 2021-08-03 DIAGNOSIS — Z9049 Acquired absence of other specified parts of digestive tract: Secondary | ICD-10-CM

## 2021-08-03 DIAGNOSIS — R570 Cardiogenic shock: Secondary | ICD-10-CM | POA: Diagnosis not present

## 2021-08-03 DIAGNOSIS — K5790 Diverticulosis of intestine, part unspecified, without perforation or abscess without bleeding: Secondary | ICD-10-CM | POA: Diagnosis present

## 2021-08-03 DIAGNOSIS — A419 Sepsis, unspecified organism: Secondary | ICD-10-CM | POA: Diagnosis not present

## 2021-08-03 DIAGNOSIS — R079 Chest pain, unspecified: Secondary | ICD-10-CM | POA: Diagnosis not present

## 2021-08-03 DIAGNOSIS — K219 Gastro-esophageal reflux disease without esophagitis: Secondary | ICD-10-CM | POA: Diagnosis present

## 2021-08-03 DIAGNOSIS — G4733 Obstructive sleep apnea (adult) (pediatric): Secondary | ICD-10-CM | POA: Diagnosis not present

## 2021-08-03 DIAGNOSIS — R401 Stupor: Secondary | ICD-10-CM

## 2021-08-03 DIAGNOSIS — R6521 Severe sepsis with septic shock: Secondary | ICD-10-CM | POA: Diagnosis not present

## 2021-08-03 DIAGNOSIS — Z881 Allergy status to other antibiotic agents status: Secondary | ICD-10-CM

## 2021-08-03 DIAGNOSIS — R404 Transient alteration of awareness: Secondary | ICD-10-CM | POA: Diagnosis not present

## 2021-08-03 DIAGNOSIS — Z8249 Family history of ischemic heart disease and other diseases of the circulatory system: Secondary | ICD-10-CM

## 2021-08-03 DIAGNOSIS — Z4682 Encounter for fitting and adjustment of non-vascular catheter: Secondary | ICD-10-CM | POA: Diagnosis not present

## 2021-08-03 DIAGNOSIS — R918 Other nonspecific abnormal finding of lung field: Secondary | ICD-10-CM | POA: Diagnosis not present

## 2021-08-03 DIAGNOSIS — Z9842 Cataract extraction status, left eye: Secondary | ICD-10-CM

## 2021-08-03 DIAGNOSIS — R739 Hyperglycemia, unspecified: Secondary | ICD-10-CM | POA: Diagnosis not present

## 2021-08-03 DIAGNOSIS — Z882 Allergy status to sulfonamides status: Secondary | ICD-10-CM

## 2021-08-03 DIAGNOSIS — Z833 Family history of diabetes mellitus: Secondary | ICD-10-CM

## 2021-08-03 DIAGNOSIS — N170 Acute kidney failure with tubular necrosis: Secondary | ICD-10-CM | POA: Diagnosis not present

## 2021-08-03 DIAGNOSIS — I2609 Other pulmonary embolism with acute cor pulmonale: Secondary | ICD-10-CM | POA: Diagnosis present

## 2021-08-03 DIAGNOSIS — E662 Morbid (severe) obesity with alveolar hypoventilation: Secondary | ICD-10-CM | POA: Diagnosis not present

## 2021-08-03 DIAGNOSIS — Z955 Presence of coronary angioplasty implant and graft: Secondary | ICD-10-CM

## 2021-08-03 DIAGNOSIS — J9602 Acute respiratory failure with hypercapnia: Secondary | ICD-10-CM | POA: Diagnosis not present

## 2021-08-03 DIAGNOSIS — M1990 Unspecified osteoarthritis, unspecified site: Secondary | ICD-10-CM | POA: Diagnosis present

## 2021-08-03 DIAGNOSIS — Z91048 Other nonmedicinal substance allergy status: Secondary | ICD-10-CM

## 2021-08-03 LAB — CBC WITH DIFFERENTIAL/PLATELET
Abs Immature Granulocytes: 0.09 10*3/uL — ABNORMAL HIGH (ref 0.00–0.07)
Basophils Absolute: 0.1 10*3/uL (ref 0.0–0.1)
Basophils Relative: 0 %
Eosinophils Absolute: 0 10*3/uL (ref 0.0–0.5)
Eosinophils Relative: 0 %
HCT: 41.3 % (ref 36.0–46.0)
Hemoglobin: 12.6 g/dL (ref 12.0–15.0)
Immature Granulocytes: 1 %
Lymphocytes Relative: 11 %
Lymphs Abs: 1.5 10*3/uL (ref 0.7–4.0)
MCH: 29.4 pg (ref 26.0–34.0)
MCHC: 30.5 g/dL (ref 30.0–36.0)
MCV: 96.3 fL (ref 80.0–100.0)
Monocytes Absolute: 1 10*3/uL (ref 0.1–1.0)
Monocytes Relative: 7 %
Neutro Abs: 11.2 10*3/uL — ABNORMAL HIGH (ref 1.7–7.7)
Neutrophils Relative %: 81 %
Platelets: 428 10*3/uL — ABNORMAL HIGH (ref 150–400)
RBC: 4.29 MIL/uL (ref 3.87–5.11)
RDW: 14.8 % (ref 11.5–15.5)
WBC: 13.9 10*3/uL — ABNORMAL HIGH (ref 4.0–10.5)
nRBC: 0 % (ref 0.0–0.2)

## 2021-08-03 LAB — POCT I-STAT 7, (LYTES, BLD GAS, ICA,H+H)
Acid-base deficit: 2 mmol/L (ref 0.0–2.0)
Bicarbonate: 25.2 mmol/L (ref 20.0–28.0)
Calcium, Ion: 1.2 mmol/L (ref 1.15–1.40)
HCT: 40 % (ref 36.0–46.0)
Hemoglobin: 13.6 g/dL (ref 12.0–15.0)
O2 Saturation: 89 %
Patient temperature: 99.1
Potassium: 5.2 mmol/L — ABNORMAL HIGH (ref 3.5–5.1)
Sodium: 137 mmol/L (ref 135–145)
TCO2: 27 mmol/L (ref 22–32)
pCO2 arterial: 54.2 mmHg — ABNORMAL HIGH (ref 32–48)
pH, Arterial: 7.276 — ABNORMAL LOW (ref 7.35–7.45)
pO2, Arterial: 67 mmHg — ABNORMAL LOW (ref 83–108)

## 2021-08-03 LAB — COMPREHENSIVE METABOLIC PANEL
ALT: 60 U/L — ABNORMAL HIGH (ref 0–44)
AST: 239 U/L — ABNORMAL HIGH (ref 15–41)
Albumin: 3.7 g/dL (ref 3.5–5.0)
Alkaline Phosphatase: 106 U/L (ref 38–126)
Anion gap: 8 (ref 5–15)
BUN: 23 mg/dL (ref 8–23)
CO2: 24 mmol/L (ref 22–32)
Calcium: 8.6 mg/dL — ABNORMAL LOW (ref 8.9–10.3)
Chloride: 104 mmol/L (ref 98–111)
Creatinine, Ser: 1.69 mg/dL — ABNORMAL HIGH (ref 0.44–1.00)
GFR, Estimated: 31 mL/min — ABNORMAL LOW (ref >60)
Glucose, Bld: 226 mg/dL — ABNORMAL HIGH (ref 70–99)
Potassium: 6.4 mmol/L (ref 3.5–5.1)
Sodium: 136 mmol/L (ref 135–145)
Total Bilirubin: 0.8 mg/dL (ref 0.3–1.2)
Total Protein: 6.8 g/dL (ref 6.5–8.1)

## 2021-08-03 LAB — I-STAT ARTERIAL BLOOD GAS, ED
Acid-base deficit: 1 mmol/L (ref 0.0–2.0)
Bicarbonate: 26.7 mmol/L (ref 20.0–28.0)
Calcium, Ion: 1.19 mmol/L (ref 1.15–1.40)
HCT: 42 % (ref 36.0–46.0)
Hemoglobin: 14.3 g/dL (ref 12.0–15.0)
O2 Saturation: 81 %
Potassium: 6.6 mmol/L (ref 3.5–5.1)
Sodium: 136 mmol/L (ref 135–145)
TCO2: 28 mmol/L (ref 22–32)
pCO2 arterial: 57 mmHg — ABNORMAL HIGH (ref 32–48)
pH, Arterial: 7.278 — ABNORMAL LOW (ref 7.35–7.45)
pO2, Arterial: 52 mmHg — ABNORMAL LOW (ref 83–108)

## 2021-08-03 LAB — RESPIRATORY PANEL BY PCR

## 2021-08-03 LAB — ECHOCARDIOGRAM LIMITED
Area-P 1/2: 4.6 cm2
Calc EF: 37.9 %
Height: 61 in
S' Lateral: 2 cm
Single Plane A2C EF: 40.1 %
Single Plane A4C EF: 36.1 %

## 2021-08-03 LAB — I-STAT VENOUS BLOOD GAS, ED
Acid-Base Excess: 0 mmol/L (ref 0.0–2.0)
Bicarbonate: 26 mmol/L (ref 20.0–28.0)
Calcium, Ion: 0.97 mmol/L — ABNORMAL LOW (ref 1.15–1.40)
HCT: 41 % (ref 36.0–46.0)
Hemoglobin: 13.9 g/dL (ref 12.0–15.0)
O2 Saturation: 97 %
Potassium: 7.9 mmol/L (ref 3.5–5.1)
Sodium: 133 mmol/L — ABNORMAL LOW (ref 135–145)
TCO2: 27 mmol/L (ref 22–32)
pCO2, Ven: 46.1 mmHg (ref 44–60)
pH, Ven: 7.36 (ref 7.25–7.43)
pO2, Ven: 90 mmHg — ABNORMAL HIGH (ref 32–45)

## 2021-08-03 LAB — ACETAMINOPHEN LEVEL: Acetaminophen (Tylenol), Serum: <10 ug/mL — ABNORMAL LOW (ref 10–30)

## 2021-08-03 LAB — BASIC METABOLIC PANEL
Anion gap: 6 (ref 5–15)
BUN: 22 mg/dL (ref 8–23)
CO2: 22 mmol/L (ref 22–32)
Calcium: 8 mg/dL — ABNORMAL LOW (ref 8.9–10.3)
Chloride: 109 mmol/L (ref 98–111)
Creatinine, Ser: 1.26 mg/dL — ABNORMAL HIGH (ref 0.44–1.00)
GFR, Estimated: 43 mL/min — ABNORMAL LOW (ref >60)
Glucose, Bld: 118 mg/dL — ABNORMAL HIGH (ref 70–99)
Potassium: 4.7 mmol/L (ref 3.5–5.1)
Sodium: 137 mmol/L (ref 135–145)

## 2021-08-03 LAB — MRSA NEXT GEN BY PCR, NASAL
MRSA by PCR Next Gen: NOT DETECTED
MRSA by PCR Next Gen: NOT DETECTED

## 2021-08-03 LAB — URINALYSIS, MICROSCOPIC (REFLEX)

## 2021-08-03 LAB — COOXEMETRY PANEL
Carboxyhemoglobin: 2.2 % — ABNORMAL HIGH (ref 0.5–1.5)
Methemoglobin: <0.7 % (ref 0.0–1.5)
O2 Saturation: 56.7 %
Total hemoglobin: 12.4 g/dL (ref 12.0–16.0)

## 2021-08-03 LAB — URINALYSIS, ROUTINE W REFLEX MICROSCOPIC
Bilirubin Urine: NEGATIVE
Glucose, UA: NEGATIVE mg/dL
Hgb urine dipstick: NEGATIVE
Ketones, ur: NEGATIVE mg/dL
Leukocytes,Ua: NEGATIVE
Nitrite: NEGATIVE
Protein, ur: 100 mg/dL — AB
Specific Gravity, Urine: >1.03 — ABNORMAL HIGH (ref 1.005–1.030)
pH: 5.5 (ref 5.0–8.0)

## 2021-08-03 LAB — RESP PANEL BY RT-PCR (FLU A&B, COVID) ARPGX2
Influenza A by PCR: NEGATIVE
Influenza B by PCR: NEGATIVE
SARS Coronavirus 2 by RT PCR: NEGATIVE

## 2021-08-03 LAB — LACTIC ACID, PLASMA
Lactic Acid, Venous: 3 mmol/L (ref 0.5–1.9)
Lactic Acid, Venous: 3.3 mmol/L (ref 0.5–1.9)
Lactic Acid, Venous: 1.3 mmol/L (ref 0.5–1.9)

## 2021-08-03 LAB — TROPONIN I (HIGH SENSITIVITY)
Troponin I (High Sensitivity): 131 ng/L (ref <18)
Troponin I (High Sensitivity): 136 ng/L (ref <18)

## 2021-08-03 LAB — PROTIME-INR
INR: 1.2 (ref 0.8–1.2)
Prothrombin Time: 15.3 seconds — ABNORMAL HIGH (ref 11.4–15.2)

## 2021-08-03 LAB — SALICYLATE LEVEL: Salicylate Lvl: <7 mg/dL — ABNORMAL LOW (ref 7.0–30.0)

## 2021-08-03 LAB — AMMONIA: Ammonia: 32 umol/L (ref 9–35)

## 2021-08-03 LAB — PROCALCITONIN: Procalcitonin: 0.17 ng/mL

## 2021-08-03 LAB — MAGNESIUM: Magnesium: 2.4 mg/dL (ref 1.7–2.4)

## 2021-08-03 LAB — GLUCOSE, CAPILLARY
Glucose-Capillary: 128 mg/dL — ABNORMAL HIGH (ref 70–99)
Glucose-Capillary: 145 mg/dL — ABNORMAL HIGH (ref 70–99)
Glucose-Capillary: 200 mg/dL — ABNORMAL HIGH (ref 70–99)

## 2021-08-03 LAB — BRAIN NATRIURETIC PEPTIDE: B Natriuretic Peptide: 304.1 pg/mL — ABNORMAL HIGH (ref 0.0–100.0)

## 2021-08-03 MED ORDER — DOCUSATE SODIUM 50 MG/5ML PO LIQD
100.0000 mg | Freq: Two times a day (BID) | ORAL | Status: DC
  Administered 2021-08-03 – 2021-08-06 (×6): 100 mg
  Filled 2021-08-03 (×6): qty 10

## 2021-08-03 MED ORDER — VASOPRESSIN 20 UNITS/100 ML INFUSION FOR SHOCK: 0.0000 [IU]/min | INTRAVENOUS | Status: DC

## 2021-08-03 MED ORDER — SODIUM CHLORIDE 0.9% FLUSH
10.0000 mL | Freq: Two times a day (BID) | INTRAVENOUS | Status: DC
  Administered 2021-08-03 – 2021-08-06 (×7): 10 mL

## 2021-08-03 MED ORDER — PANTOPRAZOLE SODIUM 40 MG IV SOLR
40.0000 mg | Freq: Every day | INTRAVENOUS | Status: DC
  Administered 2021-08-03 – 2021-08-04 (×2): 40 mg via INTRAVENOUS
  Filled 2021-08-03 (×2): qty 10

## 2021-08-03 MED ORDER — LACTATED RINGERS IV SOLN: INTRAVENOUS | Status: DC

## 2021-08-03 MED ORDER — NOREPINEPHRINE 4 MG/250ML-% IV SOLN: 2.0000 ug/min | INTRAVENOUS | Status: DC

## 2021-08-03 MED ORDER — SODIUM CHLORIDE 0.9 % IV SOLN
250.0000 mL | INTRAVENOUS | Status: DC
  Administered 2021-08-03 – 2021-08-04 (×2): 250 mL via INTRAVENOUS

## 2021-08-03 MED ORDER — MIDAZOLAM HCL 2 MG/2ML IJ SOLN
INTRAMUSCULAR | Status: AC
  Administered 2021-08-03: 2 mg
  Filled 2021-08-03: qty 2

## 2021-08-03 MED ORDER — SODIUM CHLORIDE 0.9 % IV SOLN
INTRAVENOUS | Status: DC | PRN
Start: 2021-08-03 — End: 2021-08-07

## 2021-08-03 MED ORDER — HEPARIN SODIUM (PORCINE) 5000 UNIT/ML IJ SOLN
5000.0000 [IU] | Freq: Three times a day (TID) | INTRAMUSCULAR | Status: DC
  Administered 2021-08-04 – 2021-08-07 (×10): 5000 [IU] via SUBCUTANEOUS
  Filled 2021-08-03 (×10): qty 1

## 2021-08-03 MED ORDER — CHLORHEXIDINE GLUCONATE CLOTH 2 % EX PADS
6.0000 | MEDICATED_PAD | Freq: Every day | CUTANEOUS | Status: DC
  Administered 2021-08-04: 6 via TOPICAL

## 2021-08-03 MED ORDER — SODIUM CHLORIDE 0.9% FLUSH: 10.0000 mL | INTRAVENOUS | Status: DC | PRN

## 2021-08-03 MED ORDER — VANCOMYCIN HCL IN DEXTROSE 1-5 GM/200ML-% IV SOLN: 1000.0000 mg | Freq: Once | INTRAVENOUS | Status: DC

## 2021-08-03 MED ORDER — SODIUM CHLORIDE 0.9 % IV SOLN
2.0000 g | Freq: Two times a day (BID) | INTRAVENOUS | Status: DC
  Administered 2021-08-04 – 2021-08-07 (×8): 2 g via INTRAVENOUS
  Filled 2021-08-03 (×8): qty 12.5

## 2021-08-03 MED ORDER — POLYETHYLENE GLYCOL 3350 17 G PO PACK
17.0000 g | PACK | Freq: Every day | ORAL | Status: DC
  Administered 2021-08-05 – 2021-08-06 (×2): 17 g
  Filled 2021-08-03 (×2): qty 1

## 2021-08-03 MED ORDER — SODIUM ZIRCONIUM CYCLOSILICATE 10 G PO PACK
10.0000 g | PACK | Freq: Once | ORAL | Status: AC
  Administered 2021-08-03: 10 g
  Filled 2021-08-03: qty 1

## 2021-08-03 MED ORDER — CHLORHEXIDINE GLUCONATE 0.12% ORAL RINSE (MEDLINE KIT)
15.0000 mL | Freq: Two times a day (BID) | OROMUCOSAL | Status: DC
  Administered 2021-08-03 – 2021-08-06 (×7): 15 mL via OROMUCOSAL

## 2021-08-03 MED ORDER — CHLORHEXIDINE GLUCONATE CLOTH 2 % EX PADS
6.0000 | MEDICATED_PAD | Freq: Every day | CUTANEOUS | Status: DC
  Administered 2021-08-03 – 2021-08-07 (×4): 6 via TOPICAL

## 2021-08-03 MED ORDER — FENTANYL CITRATE (PF) 100 MCG/2ML IJ SOLN
INTRAMUSCULAR | Status: AC
  Administered 2021-08-03: 100 ug
  Filled 2021-08-03: qty 2

## 2021-08-03 MED ORDER — VANCOMYCIN HCL 10 G IV SOLR
2250.0000 mg | Freq: Once | INTRAVENOUS | Status: AC
  Administered 2021-08-03: 2250 mg via INTRAVENOUS
  Filled 2021-08-03: qty 22.5

## 2021-08-03 MED ORDER — NOREPINEPHRINE 4 MG/250ML-% IV SOLN: 0.0000 ug/min | INTRAVENOUS | Status: DC

## 2021-08-03 MED ORDER — NOREPINEPHRINE 4 MG/250ML-% IV SOLN
0.0000 ug/min | INTRAVENOUS | Status: DC
  Administered 2021-08-03: 20 ug/min via INTRAVENOUS
  Filled 2021-08-03: qty 250

## 2021-08-03 MED ORDER — FENTANYL CITRATE PF 50 MCG/ML IJ SOSY
25.0000 ug | PREFILLED_SYRINGE | INTRAMUSCULAR | Status: DC | PRN
  Administered 2021-08-05: 25 ug via INTRAVENOUS

## 2021-08-03 MED ORDER — VANCOMYCIN HCL IN DEXTROSE 1-5 GM/200ML-% IV SOLN: 1000.0000 mg | INTRAVENOUS | Status: DC

## 2021-08-03 MED ORDER — IOHEXOL 350 MG/ML SOLN
80.0000 mL | Freq: Once | INTRAVENOUS | Status: AC | PRN
  Administered 2021-08-03: 80 mL via INTRAVENOUS

## 2021-08-03 MED ORDER — METRONIDAZOLE 500 MG/100ML IV SOLN
500.0000 mg | Freq: Once | INTRAVENOUS | Status: AC
  Administered 2021-08-03: 500 mg via INTRAVENOUS
  Filled 2021-08-03: qty 100

## 2021-08-03 MED ORDER — ATROPINE SULFATE 1 MG/10ML IJ SOSY
1.0000 mg | PREFILLED_SYRINGE | Freq: Once | INTRAMUSCULAR | Status: AC
  Administered 2021-08-03: 1 mg via INTRAVENOUS

## 2021-08-03 MED ORDER — DEXMEDETOMIDINE HCL IN NACL 400 MCG/100ML IV SOLN
0.4000 ug/kg/h | INTRAVENOUS | Status: DC
  Administered 2021-08-03: 0.4 ug/kg/h via INTRAVENOUS
  Administered 2021-08-04: 1.2 ug/kg/h via INTRAVENOUS
  Administered 2021-08-04: 1 ug/kg/h via INTRAVENOUS
  Administered 2021-08-04: 0.3 ug/kg/h via INTRAVENOUS
  Administered 2021-08-04 – 2021-08-05 (×2): 1.2 ug/kg/h via INTRAVENOUS
  Administered 2021-08-05 (×3): 1 ug/kg/h via INTRAVENOUS
  Administered 2021-08-05: 1.2 ug/kg/h via INTRAVENOUS
  Administered 2021-08-06: 1 ug/kg/h via INTRAVENOUS
  Administered 2021-08-06: 1.2 ug/kg/h via INTRAVENOUS
  Administered 2021-08-06: 0.6 ug/kg/h via INTRAVENOUS
  Administered 2021-08-06: 1 ug/kg/h via INTRAVENOUS
  Filled 2021-08-03 (×16): qty 100

## 2021-08-03 MED ORDER — FENTANYL CITRATE PF 50 MCG/ML IJ SOSY
25.0000 ug | PREFILLED_SYRINGE | INTRAMUSCULAR | Status: DC | PRN
  Administered 2021-08-03 (×2): 100 ug via INTRAVENOUS
  Administered 2021-08-03 – 2021-08-04 (×7): 50 ug via INTRAVENOUS
  Administered 2021-08-05 (×2): 100 ug via INTRAVENOUS
  Administered 2021-08-05: 75 ug via INTRAVENOUS
  Administered 2021-08-05: 100 ug via INTRAVENOUS
  Filled 2021-08-03 (×5): qty 1
  Filled 2021-08-03: qty 2
  Filled 2021-08-03: qty 1
  Filled 2021-08-03: qty 2
  Filled 2021-08-03: qty 1

## 2021-08-03 MED ORDER — ORAL CARE MOUTH RINSE
15.0000 mL | OROMUCOSAL | Status: DC
  Administered 2021-08-03 – 2021-08-06 (×31): 15 mL via OROMUCOSAL

## 2021-08-03 MED ORDER — NOREPINEPHRINE 16 MG/250ML-% IV SOLN
0.0000 ug/min | INTRAVENOUS | Status: DC
  Administered 2021-08-03: 20 ug/min via INTRAVENOUS
  Filled 2021-08-03: qty 250

## 2021-08-03 MED ORDER — CALCIUM GLUCONATE-NACL 1-0.675 GM/50ML-% IV SOLN
1.0000 g | Freq: Once | INTRAVENOUS | Status: AC
Start: 2021-08-03 — End: 2021-08-03
  Administered 2021-08-03: 1000 mg via INTRAVENOUS
  Filled 2021-08-03: qty 50

## 2021-08-03 MED ORDER — NOREPINEPHRINE 4 MG/250ML-% IV SOLN
INTRAVENOUS | Status: AC
Start: 2021-08-03 — End: 2021-08-03
  Administered 2021-08-03: 5 ug/min
  Filled 2021-08-03: qty 250

## 2021-08-03 MED ORDER — ETOMIDATE 2 MG/ML IV SOLN
INTRAVENOUS | Status: DC | PRN
  Administered 2021-08-03: 30 mg via INTRAVENOUS

## 2021-08-03 MED ORDER — SODIUM CHLORIDE 0.9 % IV BOLUS
1000.0000 mL | Freq: Once | INTRAVENOUS | Status: AC
  Administered 2021-08-03: 1000 mL via INTRAVENOUS

## 2021-08-03 MED ORDER — FENTANYL CITRATE (PF) 100 MCG/2ML IJ SOLN
INTRAMUSCULAR | Status: AC
  Administered 2021-08-03: 150 ug
  Filled 2021-08-03: qty 4

## 2021-08-03 MED ORDER — LACTATED RINGERS IV BOLUS
1000.0000 mL | Freq: Once | INTRAVENOUS | Status: AC
  Administered 2021-08-03: 1000 mL via INTRAVENOUS

## 2021-08-03 MED ORDER — INSULIN ASPART 100 UNIT/ML IJ SOLN
0.0000 [IU] | INTRAMUSCULAR | Status: DC
  Administered 2021-08-03: 2 [IU] via SUBCUTANEOUS
  Administered 2021-08-03: 3 [IU] via SUBCUTANEOUS
  Administered 2021-08-04 (×2): 2 [IU] via SUBCUTANEOUS
  Administered 2021-08-04: 3 [IU] via SUBCUTANEOUS
  Administered 2021-08-04 (×2): 2 [IU] via SUBCUTANEOUS
  Administered 2021-08-05 (×2): 3 [IU] via SUBCUTANEOUS
  Administered 2021-08-05 (×2): 5 [IU] via SUBCUTANEOUS

## 2021-08-03 MED ORDER — SODIUM CHLORIDE 0.9 % IV SOLN
2.0000 g | Freq: Once | INTRAVENOUS | Status: AC
  Administered 2021-08-03: 2 g via INTRAVENOUS
  Filled 2021-08-03: qty 12.5

## 2021-08-03 MED ORDER — ACETAMINOPHEN 650 MG RE SUPP
650.0000 mg | Freq: Once | RECTAL | Status: AC
  Administered 2021-08-03: 650 mg via RECTAL
  Filled 2021-08-03: qty 1

## 2021-08-03 NOTE — Progress Notes (Signed)
eLink Physician-Brief Progress Note ?Patient Name: [name] ?DOB: 01/17/1941 ?MRN: [id number] ? ? ?Date of Service ? 08/03/2021  ?HPI/Events of Note ? Patient agitated and pulling on lines/tubes  ?eICU Interventions ? Precedex gtt ordered ?Wrist restraints ordered  ? ? ? ?Intervention Category ?Minor Interventions: Agitation / anxiety - evaluation and management ? ?[name] ?08/03/2021, 9:59 PM ?

## 2021-08-03 NOTE — ED Notes (Signed)
Help get patient on the monitor did ekg  ?

## 2021-08-03 NOTE — ED Triage Notes (Signed)
Pt from home via EMS. Pt's family called d/t pt not waking up this morning. LSN 2100 last night. Completely unresponsive, although breathing spontaneously. Initial SpO2 40%, placed on NRB with NPA.  ?

## 2021-08-03 NOTE — Progress Notes (Signed)
Provided emotional and spiritual support to pt,s son and other family members. Pt is currently intubated. Chaplain Available as needed. ? ?Cristopher Peru, [facility], Pager [phone]  ?

## 2021-08-03 NOTE — Progress Notes (Signed)
?  Echocardiogram ?2D Echocardiogram has been performed. ? ?[name] ?08/03/2021, 2:30 PM ?

## 2021-08-03 NOTE — ED Provider Notes (Signed)
?[facility] ?Provider Note ? ? ?CSN: [id number] ?Arrival date & time: 08/03/21  1026 ? ?  ? ?History ? ?Chief Complaint  ?Patient presents with  ? Altered Mental Status  ? ? ?[name] is a 81 y.o. female. ? ?Patient is a 81 year old female with a history of CHF, CAD, diabetes, hypertension, anemia, asthma who is presenting today with EMS due to being unresponsive.  EMS gained history from family who reports that patient was seen last night at 9:00 and seemed okay but this morning she would not wake up.  No further details are present at this time except from external medical records which show that in the last week she has been calling her PCP due to back pain and getting prescription medication for it.  They were concerned that maybe she took an additional dose of her gabapentin but EMS reported her bottle appeared to have the correct amount of pills.  When fire arrived patient's oxygen saturation was 40.  With nasal cannula they had got it up into the 60s and with nonrebreather and in nasal airway EMS had gotten her O2 sats up into the mid 90s.  Patient initially had normal heart rate and blood pressure in the 836O systolic.  Patient's blood sugar was 300. ? ?The history is provided by medical records and the EMS personnel. The history is limited by the condition of the patient.  ? ?  ? ?Home Medications ?Prior to Admission medications   ?Medication Sig Start Date End Date Taking? Authorizing Provider  ?acetaminophen (TYLENOL) 500 MG tablet Take 500 mg by mouth every 6 (six) hours as needed for headache (pain).    Yes [provider]  ?albuterol (PROVENTIL) (2.5 MG/3ML) 0.083% nebulizer solution Take 3 mLs (2.5 mg total) by nebulization every 4 (four) hours as needed for wheezing or shortness of breath. 02/12/17  Yes [name], MD  ?albuterol (VENTOLIN HFA) 108 (90 BASE) MCG/ACT inhaler Inhale 2 puffs into the lungs every 4 (four) hours as needed for wheezing  or shortness of breath. 05/25/14  Yes [name], MD  ?ALPRAZolam Duanne Moron) 0.5 MG tablet Take 1 tablet (0.5 mg total) by mouth 3 (three) times daily as needed. ?Patient taking differently: Take 0.5 mg by mouth 3 (three) times daily as needed for anxiety. 03/10/21  Yes [name], MD  ?amLODipine (NORVASC) 10 MG tablet Take 1 tablet (10 mg total) by mouth daily. 02/16/21  Yes Jerline Pain, MD  ?atenolol (TENORMIN) 50 MG tablet TAKE 1 TABLET BY MOUTH EVERY DAY ?Patient taking differently: Take 50 mg by mouth daily. 07/24/21  Yes Jerline Pain, MD  ?atorvastatin (LIPITOR) 20 MG tablet TAKE 1 TABLET (20 MG TOTAL) BY MOUTH DAILY. PLEASE KEEP UPCOMING APPT FOR FUTURE REFILLS. 05/16/21  Yes Jerline Pain, MD  ?azelastine (ASTELIN) 0.1 % nasal spray Place 1 spray into both nostrils at bedtime. 12/08/16  Yes [provider]  ?budesonide-formoterol (SYMBICORT) 160-4.5 MCG/ACT inhaler Inhale 2 puffs into the lungs 2 (two) times daily.   Yes [provider]  ?Cholecalciferol (VITAMIN D-3) 5000 UNITS TABS Take 5,000 Units by mouth daily.   Yes [provider]  ?clopidogrel (PLAVIX) 75 MG tablet Take 1 tablet (75 mg total) by mouth daily. 05/04/21  Yes Jerline Pain, MD  ?diclofenac sodium (VOLTAREN) 1 % GEL Apply 2 g topically 4 (four) times daily as needed (Pain). 06/18/18  Yes [provider]  ?diphenoxylate-atropine (LOMOTIL) 2.5-0.025 MG tablet  TAKE 1 TABLET BY MOUTH TWICE DAILY AS NEEDED FOR DIARRHEA OR LOOSE STOOL ?Patient taking differently: Take 1 tablet by mouth 2 (two) times daily as needed for diarrhea or loose stools. 02/28/21  Yes Ladene Artist, MD  ?Ferrous Sulfate (IRON) 325 (65 Fe) MG TABS Take 1 tablet (325 mg total) by mouth 2 (two) times daily. 02/27/19  Yes Ladene Artist, MD  ?fluticasone (FLONASE) 50 MCG/ACT nasal spray PLACE 2 SPRAYS IN EACH NOSTRIL AT BEDTIME ?Patient taking differently: Place 2 sprays into both nostrils at bedtime. 07/26/15  Yes [name],  MD  ?furosemide (LASIX) 40 MG tablet Take 1 tablet (40 mg total) by mouth 2 (two) times daily. 06/16/21  Yes [name], MD  ?gabapentin (NEURONTIN) 600 MG tablet Take 1 tablet (600 mg total) by mouth 3 (three) times daily. 03/10/21  Yes [name], MD  ?glimepiride (AMARYL) 1 MG tablet Take 2 tablets (2 mg total) by mouth daily with breakfast. 06/16/21  Yes [name], MD  ?glycopyrrolate (ROBINUL) 1 MG tablet TAKE 1 TABLET BY MOUTH TWICE A DAY ?Patient taking differently: Take 1 mg by mouth 2 (two) times daily. 07/05/21  Yes Ladene Artist, MD  ?HYDROcodone-acetaminophen (NORCO) 10-325 MG tablet Take 1 tablet by mouth every 6 (six) hours as needed for severe pain. 08/28/21 09/27/21 Yes [name], MD  ?hydrOXYzine (VISTARIL) 25 MG capsule TAKE 1 CAPSULE (25 MG TOTAL) BY MOUTH EVERY 6 (SIX) HOURS AS NEEDED. ?Patient taking differently: Take 25 mg by mouth every 6 (six) hours as needed for anxiety. 07/20/21  Yes [name], MD  ?isosorbide mononitrate (IMDUR) 60 MG 24 hr tablet Take 1 tablet (60 mg total) by mouth daily. 06/07/21  Yes Jerline Pain, MD  ?KLOR-CON M20 20 MEQ tablet TAKE 1 TABLET BY MOUTH 3 TIMES DAILY. PLEASE KEEP UPCOMING APPT FOR FUTURE REFILLS. ?Patient taking differently: Take 20 mEq by mouth 3 (three) times daily. Please keep upcoming appt for future refills 05/16/21  Yes Jerline Pain, MD  ?levocetirizine (XYZAL) 5 MG tablet Take 5 mg by mouth every evening.   Yes [provider]  ?loperamide (IMODIUM) 2 MG capsule Take 4-6 mg by mouth daily.   Yes [provider]  ?losartan (COZAAR) 50 MG tablet TAKE 1 TABLET BY MOUTH EVERY DAY ?Patient taking differently: Take 50 mg by mouth daily. 06/07/21  Yes Jerline Pain, MD  ?magnesium oxide (MAG-OX) 400 MG tablet TAKE 2 TABLETS BY MOUTH DAILY. ?Patient taking differently: Take 400 mg by mouth daily. 04/21/21  Yes [name], MD  ?mesalamine (LIALDA) 1.2 g EC tablet TAKE 2 TABLETS (2.4 G TOTAL) BY MOUTH 2 (TWO) TIMES  DAILY. 07/24/21  Yes Ladene Artist, MD  ?metFORMIN (GLUCOPHAGE XR) 750 MG 24 hr tablet Take 1 tablet (750 mg total) by mouth daily with breakfast. 12/02/20  Yes [name], MD  ?montelukast (SINGULAIR) 10 MG tablet Take one every morning ?Patient taking differently: Take 10 mg by mouth every morning. 10/28/18  Yes [name], MD  ?nabumetone (RELAFEN) 750 MG tablet Take 1 tablet by mouth 2 times per day with a meal. 07/20/21  Yes [name], MD  ?neomycin-polymyxin-hydrocortisone (CORTISPORIN) OTIC solution Place 4 drops into both ears 4 (four) times daily. 05/25/20  Yes [name], MD  ?nitroGLYCERIN (NITROSTAT) 0.4 MG SL tablet PLACE 1 TABLET (0.4 MG TOTAL) UNDER THE TONGUE EVERY 5 (FIVE) MINUTES AS NEEDED FOR CHEST PAIN. 11/11/19  Yes Jerline Pain, MD  ?Olopatadine HCl 0.7 % SOLN Place 1 drop into both eyes daily as needed (itching/allergies).   Yes [provider]  ?Pancrelipase, Lip-Prot-Amyl, (ZENPEP) 20000-63000 units CPEP TAKE 2 CAPSULES (40,000 UNITS) WITH MEALS AND 1 CAPSULE (20,000 UNITS) WITH A SNACK ?Patient taking differently: Take 20,000-40,000 Units by mouth 3 (three) times daily. Take 2 capsules (40,000 units) with meals and 1 capsule (20,000 units) with a snack 03/29/21  Yes Ladene Artist, MD  ?pantoprazole (PROTONIX) 40 MG tablet Take 1 tablet (40 mg total) by mouth 2 (two) times daily. 11/14/20  Yes Ladene Artist, MD  ?Polyethyl Glycol-Propyl Glycol (SYSTANE OP) Place 1 drop into both eyes daily. For dry eyes   Yes [provider]  ?sitaGLIPtin (JANUVIA) 100 MG tablet Take 1 tablet (100 mg total) by mouth daily. 06/19/21  Yes [name], MD  ?tiZANidine (ZANAFLEX) 4 MG tablet TAKE 1 TABLET BY MOUTH TWICE A DAY ?Patient taking differently: Take 4 mg by mouth 2 (two) times daily. 06/19/21  Yes [name], MD  ?[facility] ophthalmic solution Place 1 drop into both eyes 4 (four) times daily. 07/11/21  Yes [provider]  ?Blood Glucose Monitoring  Suppl ([place]) w/Device KIT 1 each by Does not apply route daily at 12 noon. 07/14/20   [name], MD  ?glucose blood (ONETOUCH VERIO) test strip Use to check blood glucose once da

## 2021-08-03 NOTE — Progress Notes (Signed)
Pt transported to CT then to 8M21 without complications. ?

## 2021-08-03 NOTE — Procedures (Signed)
Arterial Catheter Insertion Procedure Note ? ?[name]  ?[id number]  ?[birth year] ? ?Date:08/03/21  ?Time:4:45 PM  ? ? ?Provider Performing: Ned Grace  ? ? ?Procedure: Insertion of Arterial Line [id number]) without US guidance ? ?Indication(s) ?Blood pressure monitoring and/or need for frequent ABGs ? ?Consent ?Risks of the procedure as well as the alternatives and risks of each were explained to the patient and/or caregiver.  Consent for the procedure was obtained and is signed in the bedside chart ? ?Anesthesia ?None ? ? ?Time Out ?Verified patient identification, verified procedure, site/side was marked, verified correct patient position, special equipment/implants available, medications/allergies/relevant history reviewed, required imaging and test results available. ? ? ?Sterile Technique ?Maximal sterile technique including full sterile barrier drape, hand hygiene, sterile gown, sterile gloves, mask, hair covering, sterile ultrasound probe cover (if used). ? ? ?Procedure Description ?Area of catheter insertion was cleaned with chlorhexidine and draped in sterile fashion. With an arterial catheter was placed into the right radial artery.  Appropriate arterial tracings confirmed on monitor.  Good blood return, waveform. ? ? ?Complications/Tolerance ?None; patient tolerated the procedure well. ? ? ?EBL ?Minimal ? ? ?Specimen(s) ?None ? ?

## 2021-08-03 NOTE — ED Notes (Signed)
Trauma Event Note ? ? ?Event Summary: ? ?Unresponsive patient, NPA in place by EMS. Spontaneous respirations, NRB at 15L with SpO2 in the 80s. Assisted with medication administration for intubation, OG placement. Total time approx 45 minutes providing patient care. ? ? ?[place] Kyle Stansell  ?Trauma Response RN ? ?Please call TRN at [phone] for further assistance. ? ? ?  ?

## 2021-08-03 NOTE — Progress Notes (Signed)
?  Transition of Care (TOC) Screening Note ? ? ?Patient Details  ?Name: [name] ?Date of Birth: 1940/11/12 ? ? ?Transition of Care (TOC) CM/SW Contact:    ?Renji Berwick, LCSW ?Phone Number: ?08/03/2021, 4:39 PM ? ? ? ?Transition of Care Department [facility]) has reviewed patient and no TOC needs have been identified at this time. We will continue to monitor patient advancement through interdisciplinary progression rounds. Patient will benefit from PT/OT consult for disposition recommendations. If new patient transition needs arise, please place a TOC consult. ?  ?

## 2021-08-03 NOTE — Sepsis Progress Note (Signed)
Sepsis protocol monitored by eLink 

## 2021-08-03 NOTE — ED Provider Notes (Signed)
Procedure Name: Intubation ?Date/Time: 08/03/2021 11:22 AM ?Performed by: Tedd Sias, PA ?Pre-anesthesia Checklist: Patient identified, Patient being monitored, Emergency Drugs available, Timeout performed and Suction available ?Oxygen Delivery Method: Ambu bag ?Preoxygenation: Pre-oxygenation with 100% oxygen ?Induction Type: IV induction ?Ventilation: Mask ventilation without difficulty ?Laryngoscope Size: 3 ?Tube size: 7.5 mm ?Number of attempts: 1 ?Airway Equipment and Method: Video-laryngoscopy ?Placement Confirmation: ETT inserted through vocal cords under direct vision, CO2 detector and Breath sounds checked- equal and bilateral ?Secured at: 25 (teeth) cm ?Tube secured with: ETT holder ?Comments: Intubated with Dr. Maryan Rued who is attending physician. ? ? ?  ?  ?Tedd Sias, [place] ?08/03/21 1133 ? ?  ?Blanchie Dessert, MD ?08/04/21 [phone] ? ?

## 2021-08-03 NOTE — Progress Notes (Signed)
Awake, MAE to command, agitated and pulling at ETT, trying to pull off safety mitts. Responded well to fentanyl 50 mcg IV per prn order earlier but awake and agitated again. Reoriented and reassured. Called eLink to discuss need for sedation and bilat soft limb wrist restraints for pt safety.  ?

## 2021-08-03 NOTE — Progress Notes (Incomplete)
Pharmacy Antibiotic Note ? ?[name] is a 81 y.o. female admitted on 08/03/2021 with sepsis.  Pharmacy has been consulted for cefepime and vancomycin dosing. ? ?Plan: ?Cefepime 2g  ?Vancomycin 2265m IV x 1, followed by  ?Vancomycin  ? ?Height: _0  (154.9 cm) ?IBW/kg (Calculated) : 47.8 ? ?Temp (24hrs), Avg:98.6 ?F (37 ?C), Min:94.1 ?F (34.5 ?C), Max:101.9 ?F (38.8 ?C) ? ?Recent Labs  ?Lab 08/03/21 ?1035  ?WBC 13.9*  ?LATICACIDVEN 3.0*  ?  ?CrCl cannot be calculated (Patient's most recent lab result is older than the maximum 21 days allowed.).   ? ?Allergies  ?Allergen Reactions  ? Doxycycline Other (See Comments)  ?  Unsteady gait  ? Aspirin Nausea And Vomiting  ? Azithromycin Other (See Comments)  ?  REACTION: pt states "ZPak doesn't work"  ? Caffeine Nausea And Vomiting  ? Ciprofloxacin Nausea And Vomiting  ?  Tolerates levaquin  ? Codeine Nausea And Vomiting  ? Oxycodone   ?  Pt stated, "upsets my stomach" (11/19/17)  ? Sulfamethizole Other (See Comments)  ?  Unknown reaction  ? Tramadol Hcl Other (See Comments)  ?  REACTION: pt states "spaced-out"  ? ? ?Antimicrobials this admission: ?Cefepime 4/6 >>  ?Metronidazole 4/6 >>  ?Vancomycin 4/6 >>  ? ?Dose adjustments this admission: ?N/A ? ?Microbiology results: ?4/6 BCx: *** ?4/6 UCx: ***  ?4/6 Sputum: ***  ?4/6 MRSA PCR: *** ? ?Thank you for allowing pharmacy to be a part of this patient?s care. ? ?CKaleen Mask?08/03/2021 12:06 PM ? ?

## 2021-08-03 NOTE — Consult Note (Addendum)
?  ?Advanced Heart Failure Team Consult Note ? ? ?Primary Physician: [name], MD ?PCP-Cardiologist:  Candee Furbish, MD ? ?Reason for Consultation: Heart Failure  ? ?HPI:   ? ?[name] is seen today for evaluation of heart failure  at the request of Dr Erin Fulling.  ? ?[name] is a 81 year old with a h/o ulcerative colitis, CAD BMS prox LAD with 3 subsequent cath showing patent stent, arthritis, HFpEF, DMII, and fibromyalgia. Intolerant aspirin.  ? ?Found unresponsive by family. EMS called .Family saw her last night and she was doing ok. Family concerned she took additional dose of gabapentin. In the field O2 sats in 40s  --> NRB with improved sats. Due to inability to protect airway, intubated in the ED. CXR - small vessel chronic ischemic changes. CT head negative. CTA- negative PE with dilated pulmonary trunk suggestive of pulmonary HTN. . BNP 304, lactic acid 3, K 7.9, ammonia 32, HS trop 131>136. Hypotensive and bradycardiac in the ED. Placed on norepi, IV fluids, and atropine. Blood cultures obtained. Started on flagyl, cefepime, and vanc.  ? ?Arrived to [place] on norepi 30 mcg and intubated.  In SR 7s.  ? ?POCUS by Dr Haroldine Laws --->  Echo LV 55%. RV moderately reduced  ? ?Review of Systems: [y] = yes, _0  = no Patient is encephalopathic and or intubated. Therefore history has been obtained from chart review.  ? ? ?General: Weight gain _1 ; Weight loss _2 ; Anorexia _3 ; Fatigue [Y ]; Fever _4 ; Chills _5 ; Weakness [ Y]  ?Cardiac: Chest pain/pressure _6 ; Resting SOB _7 ; Exertional SOB _8 ; Orthopnea _9 ; Pedal Edema _10 ; Palpitations _11 ; Syncope _12 ; Presyncope _13 ; Paroxysmal nocturnal dyspnea_14   ?Pulmonary: Cough _15 ; Wheezing_16 ; Hemoptysis_17 ; Sputum _18 ; Snoring _19   ?GI: Vomiting_20 ; Dysphagia_21 ; Melena_22 ; Hematochezia _23 ; Heartburn_24 ; Abdominal pain _25 ; Constipation _26 ; Diarrhea _27 ; BRBPR _28   ?GU: Hematuria_29 ; Dysuria _30 ; Nocturia_31   ?Vascular: Pain in legs with walking _32 ; Pain in  feet with lying flat _33 ; Non-healing sores _34 ; Stroke _35 ; TIA _36 ; Slurred speech _37 ;  ?Neuro: Headaches_38 ; Vertigo_39 ; Seizures_40 ; Paresthesias_41 ;Blurred vision _42 ; Diplopia _43 ; Vision changes _44   ?Ortho/Skin: Arthritis _45 ; Joint pain [Y ]; Muscle pain _46 ; Joint swelling _47 ; Back Pain [Y ]; Rash _48   ?Psych: Depression_49 ; Anxiety_50   ?Heme: Bleeding problems _51 ; Clotting disorders _52 ; Anemia _53   ?Endocrine: Diabetes [ Y]; Thyroid dysfunction_54  ? ?Home Medications ?Prior to Admission medications   ?Medication Sig Start Date End Date Taking? Authorizing Provider  ?acetaminophen (TYLENOL) 500 MG tablet Take 500 mg by mouth every 6 (six) hours as needed for headache (pain).    Yes [provider]  ?albuterol (PROVENTIL) (2.5 MG/3ML) 0.083% nebulizer solution Take 3 mLs (2.5 mg total) by nebulization every 4 (four) hours as needed for wheezing or shortness of breath. 02/12/17  Yes [name], MD  ?albuterol (VENTOLIN HFA) 108 (90 BASE) MCG/ACT inhaler Inhale 2 puffs into the lungs every 4 (four) hours as needed for wheezing or shortness of breath. 05/25/14  Yes [name], MD  ?ALPRAZolam Duanne Moron) 0.5 MG tablet Take 1 tablet (0.5 mg total) by mouth 3 (three) times daily as needed. ?Patient taking differently: Take 0.5 mg by mouth 3 (three)  times daily as needed for anxiety. 03/10/21  Yes [name], MD  ?amLODipine (NORVASC) 10 MG tablet Take 1 tablet (10 mg total) by mouth daily. 02/16/21  Yes Jerline Pain, MD  ?atenolol (TENORMIN) 50 MG tablet TAKE 1 TABLET BY MOUTH EVERY DAY ?Patient taking differently: Take 50 mg by mouth daily. 07/24/21  Yes Jerline Pain, MD  ?atorvastatin (LIPITOR) 20 MG tablet TAKE 1 TABLET (20 MG TOTAL) BY MOUTH DAILY. PLEASE KEEP UPCOMING APPT FOR FUTURE REFILLS. 05/16/21  Yes Jerline Pain, MD  ?azelastine (ASTELIN) 0.1 % nasal spray Place 1 spray into both nostrils at bedtime. 12/08/16  Yes [provider]  ?budesonide-formoterol (SYMBICORT)  160-4.5 MCG/ACT inhaler Inhale 2 puffs into the lungs 2 (two) times daily.   Yes [provider]  ?Cholecalciferol (VITAMIN D-3) 5000 UNITS TABS Take 5,000 Units by mouth daily.   Yes [provider]  ?clopidogrel (PLAVIX) 75 MG tablet Take 1 tablet (75 mg total) by mouth daily. 05/04/21  Yes Jerline Pain, MD  ?diclofenac sodium (VOLTAREN) 1 % GEL Apply 2 g topically 4 (four) times daily as needed (Pain). 06/18/18  Yes [provider]  ?diphenoxylate-atropine (LOMOTIL) 2.5-0.025 MG tablet TAKE 1 TABLET BY MOUTH TWICE DAILY AS NEEDED FOR DIARRHEA OR LOOSE STOOL ?Patient taking differently: Take 1 tablet by mouth 2 (two) times daily as needed for diarrhea or loose stools. 02/28/21  Yes Ladene Artist, MD  ?Ferrous Sulfate (IRON) 325 (65 Fe) MG TABS Take 1 tablet (325 mg total) by mouth 2 (two) times daily. 02/27/19  Yes Ladene Artist, MD  ?fluticasone (FLONASE) 50 MCG/ACT nasal spray PLACE 2 SPRAYS IN EACH NOSTRIL AT BEDTIME ?Patient taking differently: Place 2 sprays into both nostrils at bedtime. 07/26/15  Yes [name], MD  ?furosemide (LASIX) 40 MG tablet Take 1 tablet (40 mg total) by mouth 2 (two) times daily. 06/16/21  Yes [name], MD  ?gabapentin (NEURONTIN) 600 MG tablet Take 1 tablet (600 mg total) by mouth 3 (three) times daily. 03/10/21  Yes [name], MD  ?glimepiride (AMARYL) 1 MG tablet Take 2 tablets (2 mg total) by mouth daily with breakfast. 06/16/21  Yes [name], MD  ?glycopyrrolate (ROBINUL) 1 MG tablet TAKE 1 TABLET BY MOUTH TWICE A DAY ?Patient taking differently: Take 1 mg by mouth 2 (two) times daily. 07/05/21  Yes Ladene Artist, MD  ?HYDROcodone-acetaminophen (NORCO) 10-325 MG tablet Take 1 tablet by mouth every 6 (six) hours as needed for severe pain. 08/28/21 09/27/21 Yes [name], MD  ?hydrOXYzine (VISTARIL) 25 MG capsule TAKE 1 CAPSULE (25 MG TOTAL) BY MOUTH EVERY 6 (SIX) HOURS AS NEEDED. ?Patient taking differently: Take 25 mg by  mouth every 6 (six) hours as needed for anxiety. 07/20/21  Yes [name], MD  ?isosorbide mononitrate (IMDUR) 60 MG 24 hr tablet Take 1 tablet (60 mg total) by mouth daily. 06/07/21  Yes Jerline Pain, MD  ?KLOR-CON M20 20 MEQ tablet TAKE 1 TABLET BY MOUTH 3 TIMES DAILY. PLEASE KEEP UPCOMING APPT FOR FUTURE REFILLS. ?Patient taking differently: Take 20 mEq by mouth 3 (three) times daily. Please keep upcoming appt for future refills 05/16/21  Yes Jerline Pain, MD  ?levocetirizine (XYZAL) 5 MG tablet Take 5 mg by mouth every evening.   Yes [provider]  ?loperamide (IMODIUM) 2 MG capsule Take 4-6 mg by mouth daily.   Yes [provider]  ?losartan (COZAAR) 50  MG tablet TAKE 1 TABLET BY MOUTH EVERY DAY ?Patient taking differently: Take 50 mg by mouth daily. 06/07/21  Yes Jerline Pain, MD  ?magnesium oxide (MAG-OX) 400 MG tablet TAKE 2 TABLETS BY MOUTH DAILY. ?Patient taking differently: Take 400 mg by mouth daily. 04/21/21  Yes [name], MD  ?mesalamine (LIALDA) 1.2 g EC tablet TAKE 2 TABLETS (2.4 G TOTAL) BY MOUTH 2 (TWO) TIMES DAILY. 07/24/21  Yes Ladene Artist, MD  ?metFORMIN (GLUCOPHAGE XR) 750 MG 24 hr tablet Take 1 tablet (750 mg total) by mouth daily with breakfast. 12/02/20  Yes [name], MD  ?montelukast (SINGULAIR) 10 MG tablet Take one every morning ?Patient taking differently: Take 10 mg by mouth every morning. 10/28/18  Yes [name], MD  ?nabumetone (RELAFEN) 750 MG tablet Take 1 tablet by mouth 2 times per day with a meal. 07/20/21  Yes [name], MD  ?neomycin-polymyxin-hydrocortisone (CORTISPORIN) OTIC solution Place 4 drops into both ears 4 (four) times daily. 05/25/20  Yes [name], MD  ?nitroGLYCERIN (NITROSTAT) 0.4 MG SL tablet PLACE 1 TABLET (0.4 MG TOTAL) UNDER THE TONGUE EVERY 5 (FIVE) MINUTES AS NEEDED FOR CHEST PAIN. 11/11/19  Yes Jerline Pain, MD  ?Olopatadine HCl 0.7 % SOLN Place 1 drop into both eyes daily as needed (itching/allergies).    Yes [provider]  ?Pancrelipase, Lip-Prot-Amyl, (ZENPEP) 20000-63000 units CPEP TAKE 2 CAPSULES (40,000 UNITS) WITH MEALS AND 1 CAPSULE (20,000 UNITS) WITH A SNACK ?Patient taking differently: Ta

## 2021-08-03 NOTE — Progress Notes (Signed)
PCCM Interval Progress Note ? ?Contacted by Dr. Debara Pickett via Secure Chat re: STAT Limited Echo result: ? ?STAT Echo result - LVEF 37.9%, global hypokinesis, dilated aorta to 41 mm, moderate to severe TR. ? ?Myself and Dr. Erin Fulling notified. ? ?Lestine Mount, PA-C ?[facility] ?08/03/21 2:47 PM ? ?Please see [email or web address] for pager details. ? ?From 7A-7P if no response, please call [phone] ?After hours, please call ELink [phone] ?

## 2021-08-03 NOTE — Progress Notes (Signed)
Pharmacy Antibiotic Note ? ?[name] is a 81 y.o. female admitted on 08/03/2021 with sepsis.  Pharmacy has been consulted for cefepime and vancomycin dosing. ? ?Plan: ?Cefepime 2g IV q12h ?Vancomycin 2259m IV x 1, followed by  ?Vancomycin 10026mIV q48h  ?Monitor CBC and BMP daily ?Follow up cultures ?Monitor for other clinical s/sx of infection ? ?Height: _0  (154.9 cm) ?IBW/kg (Calculated) : 47.8 ? ?Temp (24hrs), Avg:100.4 ?F (38 ?C), Min:94.1 ?F (34.5 ?C), Max:101.9 ?F (38.8 ?C) ? ?Recent Labs  ?Lab 08/03/21 ?1035 08/03/21 ?1159  ?WBC 13.9*  --   ?CREATININE  --  1.69*  ?LATICACIDVEN 3.0*  --   ?  ?CrCl cannot be calculated (Unknown ideal weight.).   ? ?Allergies  ?Allergen Reactions  ? Doxycycline Other (See Comments)  ?  Unsteady gait  ? Aspirin Nausea And Vomiting  ? Azithromycin Other (See Comments)  ?  REACTION: pt states "ZPak doesn't work"  ? Caffeine Nausea And Vomiting  ? Ciprofloxacin Nausea And Vomiting  ?  Tolerates levaquin  ? Codeine Nausea And Vomiting  ? Oxycodone Other (See Comments)  ?  Pt stated, "upsets my stomach" (11/19/17)  ? Sulfamethizole Other (See Comments)  ?  Unknown reaction  ? Tramadol Hcl Other (See Comments)  ?  REACTION: pt states "spaced-out"  ? ? ?Antimicrobials this admission: ?Cefepime 4/6 >>  ?Metronidazole 4/6 >>  ?Vancomycin 4/6 >>  ? ?Dose adjustments this admission: ?N/A ? ?Microbiology results: ?4/6 BCx: ordered ?4/6 MRSA PCR: ordered ? ?Thank you for allowing pharmacy to be a part of this patient?s care. ? ?CaKaleen Mask4/09/2021 2:29 PM ? ?

## 2021-08-03 NOTE — H&P (Signed)
? ?NAME:  [name], MRN:  [id number], DOB:  Apr 11, 1941, LOS: 0 ?ADMISSION DATE:  08/03/2021 CONSULTATION DATE:  08/03/2021 ?REFERRING MD:  Maryan Rued - EDP CHIEF COMPLAINT:  AMS, hypoxia, shock  ? ?History of Present Illness:  ?81 year old woman who presented to [facility] ED 4/6 via EMS for AMS/unresponsiveness. LKN 2100 4/5. Found unresponsive by family, breathing spontaneously, hypoxic with SpO2 40%. NPA/NRB placed by EMS. PMHx significant for HTN, HLD, CHF (Echo 03/2021 with EF 65-70%, grade 1 DD), CAD (s/p LAD stent 2002), asthma, T2DM, diverticulosis, renal lesion (stable, RCC vs. angiomyolipoma), GERD, fibromyalgia. ? ?Patient is intubated and sedated, therefore history is obtained from patient's daughter at bedside.  Patient's daughter had been with her all day yesterday (4/5) shopping and seemingly normal, patient's only complaint was nausea which caused her to vomit x 1.  She reportedly felt better after vomiting and did not have any further episodes.  No complaints of upper respiratory symptoms, fever/chills, CP/SOB, changes in bowel habits or known sick contacts. Patient was staying with her son 20/5PM; LKN 2100.  ? ?This morning, patient was found minimally responsive.  She was spontaneously breathing but hypoxic to 40s.  On EMS arrival, nasopharyngeal airway was placed and NRB.  Patient was brought to ED and subsequently intubated on arrival.  She was febrile to Tmax 101.54F, bradycardic in the 40s, hypotensive with SBP 50s.  RIJ CVC was placed and Levophed was initiated at 36mg. Labs were notable for WBC 13.9, normal H&H/Plt, INR 1.2. Na 136, K 6.4, Cr 1.69 (baseline 0.6-0.8), AST/ALT 239/60. Trop 136 (131), BNP 304.1. LA 3.0. UA unremarkable. Empiric antibiotics initiated. Initial ABG 7.27/57/52/26.7 (pre-intubation). Co-ox 56.7%. Limited Echo with EF 37.9%, global hypokinesis, mod-severe TR. Cardiology consulted. CT Head, CTA Chest and CT A/P ordered. ? ?PCCM was consulted for admission and further  management. ? ?Pertinent Medical History:  ? ?Past Medical History:  ?Diagnosis Date  ? Adenomatous colon polyp 02/2006  ? Allergy   ? Allergy, unspecified not elsewhere classified   ? Anemia   ? Anxiety   ? Arthritis   ? Asthma   ? Blood transfusion without reported diagnosis   ? Bronchiectasis   ? CAD (coronary artery disease)   ? BMS to the LAD, 2002; cath 12/07/11 patent LAD stent and mild nonobstructive disease, EF 65%; Medical management  ? Cataract   ? removed both eyes  ? [place]   ? Diastolic  ? Clotting disorder ([place]   ? in legs per pt   ? Diabetes mellitus   ? Diverticulosis of colon   ? DJD (degenerative joint disease)   ? Fibromyalgia   ? GERD (gastroesophageal reflux disease)   ? Hypercholesterolemia   ? Hypertension   ? Microscopic hematuria   ? Neoplasm of kidney   ? Obesity   ? Other diseases of lung, not elsewhere classified   ? Pinched vertebral nerve   ? Pulmonary nodule   ? Negative PET in 2010  ? Stress incontinence, female   ? Syncope   ? Ulcerative colitis, left sided ([facility] 2008  ? Hx of  ? UTI (lower urinary tract infection)   ? Vitamin D deficiency disease   ? ?Significant Hospital Events: ?Including procedures, antibiotic start and stop dates in addition to other pertinent events   ?4/5 2100 LKW ?4/6 Unresponsive at home, hypoxic to 40%. BIB EMS to ED, febrile/hypotensive, bradycardic/hypoxic. Intubated, RIJ CVC placed. Levo and Vaso started. Limited Echo with EF 37.9%, new HF. CT Head,  CTA Chest, CT A/P pending. ? ?Interim History / Subjective:  ?PCCM consulted for admission ? ?Objective:  ?Blood pressure (!) 91/59, pulse (!) 36, temperature (!) 101.5 ?F (38.6 ?C), resp. rate (!) 22, height _0  (1.549 m), SpO2 91 %. ?   ?FiO2 (%):  [100 %] 100 % ?Set Rate:  [18 bmp-22 bmp] 22 bmp ?Vt Set:  [380 mL] 380 mL ?PEEP:  [5 cmH20] 5 cmH20 ?Plateau Pressure:  [22 cmH20] 22 cmH20  ? ?Intake/Output Summary (Last 24 hours) at 08/03/2021 1317 ?Last data filed at 08/03/2021  1307 ?Gross per 24 hour  ?Intake 161.12 ml  ?Output --  ?Net 161.12 ml  ? ?There were no vitals filed for this visit. ? ?Physical Examination: ?General: Acutely ill-appearing elderly woman in NAD. Grossly edematous. ?HEENT: [place]/AT, anicteric sclera, PERRL, periorbital edema noted. Moist mucous membranes. ETT/OGT in place. ?Neuro: Sedated. Withdraws to pain in all 4 extremities. Does not respond to verbal, tactile or noxious stimuli. Not following commands. +Corneal, +Cough, and +Gag  ?CV: Bradycardic to 30s, no m/g/r. ?PULM: Breathing even and unlabored on vent (PEEP 8, FiO2 100%). Lung fields with scattered rhonchi, coarse throughout. ?GI: Soft, nontender, mildly distended. Hypoactive bowel sounds. ?Extremities: Bilateral asymmetric LE edema noted (1-2+, L > R baseline per daughter). ?Skin: Warm/dry, no rashes. ? ?Resolved Hospital Problem List:  ? ? ?Assessment & Plan:  ? ?Undifferentiated shock, suspect cardiogenic vs. Septic ?Lactic acidosis ?Fever of unknown origin ?Presented to ED with AMS, hypoxia, bradycardia, hypotension 4/6. UA unremarkable. CT Head NAICA. CTA Chest negative for PE, demonstrated cardiomegaly with extensive coronary calcifications, marked dilatation of main pulmonary trunk, small bilateral pleural effusions/bilateral infiltrates. CT A/P with stable known L renal lesion. ?- Admit to [facility] ICU ?- Goal MAP > 65 ?- Levophed initiated, titrated to goal MAP ?- Vasopressin ?- Avoid copious fluid administration, as volume overloaded on exam and new heart failure ?- Trend LA, WBC, fever curve ?- F/u PCT ?- F/u Cx data ?- Empiric antibiotics (cefepime, vanc, flagyl), likely discontinue pending PCT ? ?Acute systolic heart failure ?Bradycardia ?History of CHF ?Echo 03/2021 with EF 65-70%, grade 1 DD. New onset HF with Limited Echo 4/6 demonstrating LVEF 37.9%, global hypokinesis, dilated aorta to 41 mm, moderate to severe TR. Received atropine 7m x 1 in ED for bradycardia in 30s, no acute findings. ?-  Cardiology consulted, appreciate assistance ?- F/u Echo Complete ?- Trend Co-ox ?- Cardiac monitoring ?- F/u additional Cards recs ? ?Acute hypoxemic respiratory failure ?- Continue full vent support (4-8cc/kg IBW) ?- Wean FiO2 for O2 sat > 90% ?- Daily WUA/SBT, currently precluded by mental status and hemodynamic instability ?- VAP bundle ?- Pulmonary hygiene ?- Bronchodilators as needed ?- PAD protocol for sedation: Fentanyl for goal RASS 0 to -1 ?- F/u AM CXR ?- F/u post-intubation ABG ? ?Acute kidney injury likely ATN secondary to hypotension ?Hyperkalemia ?- Trend BMP ?- [place] VT ?- Replete electrolytes as indicated ?- Monitor I&Os ?- F/u urine studies ?- Avoid nephrotoxic agents as able ?- Ensure adequate renal perfusion ?- Consider Nephro consult if worsening renal dysfunction, may need temporary RRT ? ?Mild transaminitis ?Likely secondary to shock liver in the setting of severe hypotension ?- Trend LFTs ? ?T2DM ?Poorly controlled, A1C 05/2021 7.8%. ?- SSI, moderate scale ?- CBGs Q4H ? ?GERD ?- PPI ? ?Best Practice: (right click and "Reselect all SmartList Selections" daily)  ? ?Diet/type: NPO ?DVT prophylaxis: prophylactic heparin  ?GI prophylaxis: PPI ?Lines: Central line - RIJ CVC ?Foley:  Yes, and it is still needed ?Code Status:  full code ?Last date of multidisciplinary goals of care discussion [Pending] ? ?Labs:  ?CBC: ?Recent Labs  ?Lab 08/03/21 ?1035 08/03/21 ?1109 08/03/21 ?1151  ?WBC 13.9*  --   --   ?NEUTROABS 11.2*  --   --   ?HGB 12.6 13.9 14.3  ?HCT 41.3 41.0 42.0  ?MCV 96.3  --   --   ?PLT 428*  --   --   ? ?Basic Metabolic Panel: ?Recent Labs  ?Lab 08/03/21 ?1109 08/03/21 ?1151  ?NA 133* 136  ?K 7.9* 6.6*  ? ?GFR: ?CrCl cannot be calculated (Patient's most recent lab result is older than the maximum 21 days allowed.). ?Recent Labs  ?Lab 08/03/21 ?1035  ?WBC 13.9*  ?LATICACIDVEN 3.0*  ? ?Liver Function Tests: ?No results for input(s): AST, ALT, ALKPHOS, BILITOT, PROT, ALBUMIN in the last 168  hours. ?No results for input(s): LIPASE, AMYLASE in the last 168 hours. ?Recent Labs  ?Lab 08/03/21 ?1159  ?AMMONIA 32  ? ?ABG: ?   ?Component Value Date/Time  ? PHART 7.278 (L) 08/03/2021 1151  ? PCO2ART 57.0 (H) 0

## 2021-08-04 ENCOUNTER — Inpatient Hospital Stay (HOSPITAL_COMMUNITY): Payer: PPO

## 2021-08-04 DIAGNOSIS — R6521 Severe sepsis with septic shock: Secondary | ICD-10-CM | POA: Diagnosis not present

## 2021-08-04 DIAGNOSIS — R579 Shock, unspecified: Secondary | ICD-10-CM | POA: Diagnosis not present

## 2021-08-04 DIAGNOSIS — A419 Sepsis, unspecified organism: Secondary | ICD-10-CM | POA: Diagnosis not present

## 2021-08-04 LAB — POCT I-STAT 7, (LYTES, BLD GAS, ICA,H+H)
Acid-Base Excess: 0 mmol/L (ref 0.0–2.0)
Acid-base deficit: 1 mmol/L (ref 0.0–2.0)
Acid-base deficit: 1 mmol/L (ref 0.0–2.0)
Bicarbonate: 24.3 mmol/L (ref 20.0–28.0)
Bicarbonate: 24.4 mmol/L (ref 20.0–28.0)
Bicarbonate: 23.5 mmol/L (ref 20.0–28.0)
Calcium, Ion: 1.17 mmol/L (ref 1.15–1.40)
Calcium, Ion: 1.18 mmol/L (ref 1.15–1.40)
Calcium, Ion: 1.17 mmol/L (ref 1.15–1.40)
HCT: 36 % (ref 36.0–46.0)
HCT: 35 % — ABNORMAL LOW (ref 36.0–46.0)
HCT: 35 % — ABNORMAL LOW (ref 36.0–46.0)
Hemoglobin: 12.2 g/dL (ref 12.0–15.0)
Hemoglobin: 11.9 g/dL — ABNORMAL LOW (ref 12.0–15.0)
Hemoglobin: 11.9 g/dL — ABNORMAL LOW (ref 12.0–15.0)
O2 Saturation: 87 %
O2 Saturation: 95 %
O2 Saturation: 91 %
Patient temperature: 37
Patient temperature: 37.1
Patient temperature: 37.3
Potassium: 4.5 mmol/L (ref 3.5–5.1)
Potassium: 4.5 mmol/L (ref 3.5–5.1)
Potassium: 4.3 mmol/L (ref 3.5–5.1)
Sodium: 139 mmol/L (ref 135–145)
Sodium: 139 mmol/L (ref 135–145)
Sodium: 140 mmol/L (ref 135–145)
TCO2: 25 mmol/L (ref 22–32)
TCO2: 26 mmol/L (ref 22–32)
TCO2: 25 mmol/L (ref 22–32)
pCO2 arterial: 38.1 mmHg (ref 32–48)
pCO2 arterial: 42.2 mmHg (ref 32–48)
pCO2 arterial: 38.1 mmHg (ref 32–48)
pH, Arterial: 7.414 (ref 7.35–7.45)
pH, Arterial: 7.37 (ref 7.35–7.45)
pH, Arterial: 7.4 (ref 7.35–7.45)
pO2, Arterial: 52 mmHg — ABNORMAL LOW (ref 83–108)
pO2, Arterial: 81 mmHg — ABNORMAL LOW (ref 83–108)
pO2, Arterial: 61 mmHg — ABNORMAL LOW (ref 83–108)

## 2021-08-04 LAB — CBC
HCT: 36.8 % (ref 36.0–46.0)
Hemoglobin: 11.7 g/dL — ABNORMAL LOW (ref 12.0–15.0)
MCH: 29.9 pg (ref 26.0–34.0)
MCHC: 31.8 g/dL (ref 30.0–36.0)
MCV: 94.1 fL (ref 80.0–100.0)
Platelets: 333 10*3/uL (ref 150–400)
RBC: 3.91 MIL/uL (ref 3.87–5.11)
RDW: 14.2 % (ref 11.5–15.5)
WBC: 13.4 10*3/uL — ABNORMAL HIGH (ref 4.0–10.5)
nRBC: 0 % (ref 0.0–0.2)

## 2021-08-04 LAB — COMPREHENSIVE METABOLIC PANEL
ALT: 49 U/L — ABNORMAL HIGH (ref 0–44)
AST: 108 U/L — ABNORMAL HIGH (ref 15–41)
Albumin: 3 g/dL — ABNORMAL LOW (ref 3.5–5.0)
Alkaline Phosphatase: 92 U/L (ref 38–126)
Anion gap: 6 (ref 5–15)
BUN: 24 mg/dL — ABNORMAL HIGH (ref 8–23)
CO2: 22 mmol/L (ref 22–32)
Calcium: 8.1 mg/dL — ABNORMAL LOW (ref 8.9–10.3)
Chloride: 110 mmol/L (ref 98–111)
Creatinine, Ser: 1.13 mg/dL — ABNORMAL HIGH (ref 0.44–1.00)
GFR, Estimated: 49 mL/min — ABNORMAL LOW (ref >60)
Glucose, Bld: 121 mg/dL — ABNORMAL HIGH (ref 70–99)
Potassium: 4.5 mmol/L (ref 3.5–5.1)
Sodium: 138 mmol/L (ref 135–145)
Total Bilirubin: 0.7 mg/dL (ref 0.3–1.2)
Total Protein: 5.6 g/dL — ABNORMAL LOW (ref 6.5–8.1)

## 2021-08-04 LAB — MAGNESIUM
Magnesium: 2.3 mg/dL (ref 1.7–2.4)
Magnesium: 2.4 mg/dL (ref 1.7–2.4)
Magnesium: 2.4 mg/dL (ref 1.7–2.4)

## 2021-08-04 LAB — GLUCOSE, CAPILLARY
Glucose-Capillary: 109 mg/dL — ABNORMAL HIGH (ref 70–99)
Glucose-Capillary: 120 mg/dL — ABNORMAL HIGH (ref 70–99)
Glucose-Capillary: 128 mg/dL — ABNORMAL HIGH (ref 70–99)
Glucose-Capillary: 138 mg/dL — ABNORMAL HIGH (ref 70–99)
Glucose-Capillary: 147 mg/dL — ABNORMAL HIGH (ref 70–99)
Glucose-Capillary: 193 mg/dL — ABNORMAL HIGH (ref 70–99)

## 2021-08-04 LAB — PHOSPHORUS
Phosphorus: 4.1 mg/dL (ref 2.5–4.6)
Phosphorus: 3.6 mg/dL (ref 2.5–4.6)
Phosphorus: 3.5 mg/dL (ref 2.5–4.6)

## 2021-08-04 LAB — COOXEMETRY PANEL
Carboxyhemoglobin: 0.4 % — ABNORMAL LOW (ref 0.5–1.5)
Methemoglobin: <0.7 % (ref 0.0–1.5)
O2 Saturation: 67.7 %
Total hemoglobin: 11.3 g/dL — ABNORMAL LOW (ref 12.0–16.0)

## 2021-08-04 MED ORDER — LORAZEPAM 2 MG/ML IJ SOLN
2.0000 mg | INTRAMUSCULAR | Status: DC | PRN
  Administered 2021-08-04: 2 mg via INTRAVENOUS
  Filled 2021-08-04: qty 1

## 2021-08-04 MED ORDER — FENTANYL 2500MCG IN NS 250ML (10MCG/ML) PREMIX INFUSION
0.0000 ug/h | INTRAVENOUS | Status: DC
  Administered 2021-08-04 – 2021-08-05 (×2): 200 ug/h via INTRAVENOUS
  Administered 2021-08-05: 150 ug/h via INTRAVENOUS
  Administered 2021-08-06: 175 ug/h via INTRAVENOUS
  Filled 2021-08-04 (×4): qty 250

## 2021-08-04 MED ORDER — VANCOMYCIN HCL IN DEXTROSE 1-5 GM/200ML-% IV SOLN
1000.0000 mg | INTRAVENOUS | Status: DC
  Administered 2021-08-04 – 2021-08-06 (×3): 1000 mg via INTRAVENOUS
  Filled 2021-08-04 (×3): qty 200

## 2021-08-04 MED ORDER — VITAL 1.5 CAL PO LIQD
1000.0000 mL | ORAL | Status: DC
  Administered 2021-08-04 (×2): 1000 mL
  Filled 2021-08-04 (×3): qty 1000

## 2021-08-04 MED ORDER — PROSOURCE TF PO LIQD
45.0000 mL | Freq: Two times a day (BID) | ORAL | Status: DC
  Administered 2021-08-04 – 2021-08-06 (×5): 45 mL
  Filled 2021-08-04 (×5): qty 45

## 2021-08-04 MED ORDER — SODIUM CHLORIDE 0.9% FLUSH
3.0000 mL | Freq: Two times a day (BID) | INTRAVENOUS | Status: DC
  Administered 2021-08-04 – 2021-08-06 (×6): 3 mL via INTRAVENOUS

## 2021-08-04 MED ORDER — VITAL HIGH PROTEIN PO LIQD: 1000.0000 mL | ORAL | Status: DC

## 2021-08-04 MED ORDER — QUETIAPINE FUMARATE 25 MG PO TABS
25.0000 mg | ORAL_TABLET | Freq: Every day | ORAL | Status: DC
  Administered 2021-08-04 – 2021-08-05 (×2): 25 mg
  Filled 2021-08-04 (×2): qty 1

## 2021-08-04 NOTE — H&P (Signed)
? ?NAME:  [name], MRN:  [id number], DOB:  August 08, 1940, LOS: 1 ?ADMISSION DATE:  08/03/2021 CONSULTATION DATE:  08/03/2021 ?REFERRING MD:  Maryan Rued - EDP CHIEF COMPLAINT:  AMS, hypoxia, shock  ? ?History of Present Illness:  ?81 year old woman who presented to [facility] ED 4/6 via EMS.  LKN 2100 4/5. Found unresponsive by family, breathing spontaneously, hypoxic with SpO2 40%. .  ? ?PMHx significant for HTN, HLD, CHF (Echo 03/2021 with EF 65-70%, grade 1 DD), CAD (s/p LAD stent 2002), asthma, T2DM, diverticulosis, renal lesion (stable, RCC vs. angiomyolipoma), GERD, fibromyalgia. ? ? ? Patient's daughter had been with her all day yesterday (4/5) shopping and seemingly normal, patient's only complaint was nausea which caused her to vomit x 1.  She reportedly felt better after vomiting and did not have any further episodes.  No complaints of upper respiratory symptoms, fever/chills, CP/SOB, changes in bowel habits or known sick contacts ? ?Required intubation and initiation of NE.  ? ?Pertinent Medical History:  ? ?Past Medical History:  ?Diagnosis Date  ? Adenomatous colon polyp 02/2006  ? Allergy   ? Allergy, unspecified not elsewhere classified   ? Anemia   ? Anxiety   ? Arthritis   ? Asthma   ? Blood transfusion without reported diagnosis   ? Bronchiectasis   ? CAD (coronary artery disease)   ? BMS to the LAD, 2002; cath 12/07/11 patent LAD stent and mild nonobstructive disease, EF 65%; Medical management  ? Cataract   ? removed both eyes  ? CHF (congestive heart failure) ([place])   ? Diastolic  ? Clotting disorder ([place])   ? in legs per pt   ? Diabetes mellitus   ? Diverticulosis of colon   ? DJD (degenerative joint disease)   ? Fibromyalgia   ? GERD (gastroesophageal reflux disease)   ? Hypercholesterolemia   ? Hypertension   ? Microscopic hematuria   ? Neoplasm of kidney   ? Obesity   ? Other diseases of lung, not elsewhere classified   ? Pinched vertebral nerve   ? Pulmonary nodule   ? Negative PET in 2010  ? Stress  incontinence, female   ? Syncope   ? Ulcerative colitis, left sided [facility]) 2008  ? Hx of  ? UTI (lower urinary tract infection)   ? Vitamin D deficiency disease   ? ?Significant Hospital Events: ?Including procedures, antibiotic start and stop dates in addition to other pertinent events   ?4/5 2100 LKW ?4/6 Unresponsive at home, hypoxic to 40%. BIB EMS to ED, febrile/hypotensive, bradycardic/hypoxic. Intubated, RIJ CVC placed. Levo and Vaso started. Limited Echo with EF 37.9%, new HF.  ?4/6 CT chest shows no pulmonary embolism. Obese patient. Bibasilar and RML infiltrates. RV not enlarged but RA is on CT  ?4/6 CT head is negative, Repeat abdominal CT for cyst evaluation recommended in 3 months. ? ?Interim History / Subjective:  ? ?Patient is awake and complaining of discomfort from the ETT. ? ?Objective:  ?Blood pressure 125/70, pulse (!) 57, temperature 98.8 ?F (37.1 ?C), resp. rate (!) 26, height _0  (1.549 m), weight 103.2 kg, SpO2 94 %. ?CVP:  [10 mmHg-25 mmHg] 10 mmHg  ?Vent Mode: PRVC ?FiO2 (%):  [0 %-100 %] 80 % ?Set Rate:  [18 bmp-26 bmp] 26 bmp ?Vt Set:  [380 mL] 380 mL ?PEEP:  [5 cmH20-10 cmH20] 10 cmH20 ?Plateau Pressure:  [19 cmH20-24 cmH20] 23 cmH20  ? ?Intake/Output Summary (Last 24 hours) at 08/04/2021 1027 ?Last data filed at 08/04/2021 0800 ?  Gross per 24 hour  ?Intake 2634.9 ml  ?Output 1255 ml  ?Net 1379.9 ml  ? ? ?Filed Weights  ? 08/03/21 2207 08/04/21 0500  ?Weight: 103 kg 103.2 kg  ? ? ?Physical Examination: ?General: Acutely ill-appearing elderly woman in NAD. Grossly edematous. ?HEENT:. ETT/OGT in place. ?Neuro: Sedated but follows commands and attempts to communicate verbally.  ?CV: HS normal, extremities warm.  ?PULM: Breathing even and unlabored on vent. Crackles at right base. On  ?GI: Soft, nontender, mildly distended. Hypoactive bowel sounds. ?Extremities: Bilateral asymmetric LE edema noted (1-2+, L > R baseline per daughter). ?Skin: Warm/dry, no rashes. ? ?Ancillary testing personally  reviewed:  ?CXR today re-demonstrates RML infiltrate ?ABG pO2 81 and pCO2 42 ?Creatinine has improved to 1.13 ?Assessment & Plan:  ? ?Critically ill due to mixed septic and cardiogenic shock from CAP and acute on chronic cor pulmonale ?Critically ill due to acute hypoxic respiratory failure requiring mechanical ventilation.  ?Suspect that patient may have untreated sleep apnea.  ?Community acquired pneumonia.  ?CAD ?Acute systolic heart failure ?AKI ?Mild transaminitis - likely from congestion from RV dysfunction with TR +++ ?T2DM ?GERD ? ?Plan:  ? ?- Given that shock resolved with mechanical ventilation, suspect significant component of acute cor pulmonale causing cardiogenic shock. ?- As such best to clear acute respiratory failure component prior to evaluating chronic component of RV failure/PH.  ?- Continue empiric treatment for CAP  ?- Wean FiO2 to <0.6 prior to weaning PEEP per ARDS protocol ?- AKI also improving.  ? ?Best Practice: (right click and "Reselect all SmartList Selections" daily)  ? ?Diet/type: NPO will start tube feeds.  ?DVT prophylaxis: prophylactic heparin  ?GI prophylaxis: PPI ?Lines: Central line - RIJ CVC ?Foley:  Yes, and it is still needed ?Code Status:  full code ?Last date of multidisciplinary goals of care discussion [Family updated at bedside 4/7 by Drs Lynetta Mare and BenSimhon] ? ?CRITICAL CARE ?Performed by: Kipp Brood ? ? ?Total critical care time: 50 minutes ? ?Critical care time was exclusive of separately billable procedures and treating other patients. ? ?Critical care was necessary to treat or prevent imminent or life-threatening deterioration. ? ?Critical care was time spent personally by me on the following activities: development of treatment plan with patient and/or surrogate as well as nursing, discussions with consultants, evaluation of patient's response to treatment, examination of patient, obtaining history from patient or surrogate, ordering and performing treatments  and interventions, ordering and review of laboratory studies, ordering and review of radiographic studies, pulse oximetry, re-evaluation of patient's condition and participation in multidisciplinary rounds. ? ?Kipp Brood, MD FRCPC ?ICU Physician ?[place]  ?Pager: [phone] ?Mobile: [phone] ?After hours: [phone]. ? ?

## 2021-08-04 NOTE — Progress Notes (Signed)
RT at bedside for rounds. Pt continues to pull at ETT holder mouthing that it feels uncomfortable in her throat. During that time, pt's aline came out. Bandage applied to site. RT notified RN. ?

## 2021-08-04 NOTE — Progress Notes (Addendum)
? ? Advanced Heart Failure Rounding Note ? ?PCP-Cardiologist: Candee Furbish, MD  ? ?Subjective:   ? ?Improved today. Now off NE. MAPs 70s. Remains intubated but responding to commands. FiO2 80%.  ? ?On Vanc + cefepime. Fevers resolved. Afebrile overnight. WBC stable at 13K.  ? ?Respiratory panel negative  ?UA negative  ?PCT 0.17  ?BCx pending  ? ?LA 3.0>>3.3>>1.3  ? ?Co-ox 68%, CVP 9-10  ? ?SCr 1.69>>1.26>>1.13 ?K normalized to 4.5 today  ? ?LFTs trending down  ? ?SB on tele, 37s.  ? ? ?Objective:   ?Weight Range: ?103.2 kg ?Body mass index is 42.99 kg/m?.  ? ?Vital Signs:   ?Temp:  [94.1 ?F (34.5 ?C)-101.9 ?F (38.8 ?C)] 99 ?F (37.2 ?C) (04/07 0715) ?Pulse Rate:  [35-77] 57 (04/07 0745) ?Resp:  [0-26] 26 (04/07 0745) ?BP: (58-136)/(26-87) 108/64 (04/07 0700) ?SpO2:  [75 %-100 %] 96 % (04/07 0745) ?Arterial Line BP: (89-174)/(50-89) 115/60 (04/07 0715) ?FiO2 (%):  [0 %-100 %] 80 % (04/07 0745) ?Weight:  [103 kg-103.2 kg] 103.2 kg (04/07 0500) ?Last BM Date :  (prior to admit) ? ?Weight change: ?Filed Weights  ? 08/03/21 2207 08/04/21 0500  ?Weight: 103 kg 103.2 kg  ? ? ?Intake/Output:  ? ?Intake/Output Summary (Last 24 hours) at 08/04/2021 0801 ?Last data filed at 08/04/2021 0700 ?Gross per 24 hour  ?Intake 2619.78 ml  ?Output 1155 ml  ?Net 1464.78 ml  ?  ? ? ?Physical Exam  ?  ?CVP 9-10  ?General:  intubated, responds to commands. No distress ?HEENT: Normal ?Neck: Supple. JVP 10 cm . Carotids 2+ bilat; no bruits. No lymphadenopathy or thyromegaly appreciated. ?Cor: PMI nondisplaced. Regular rhythm, slow rate. No rubs, gallops or murmurs. ?Lungs: Intubated and clear ?Abdomen: Soft, nontender, nondistended. No hepatosplenomegaly. No bruits or masses. Good bowel sounds. ?Extremities: No cyanosis, clubbing, rash, no edema ?Neuro: Alert & orientedx3, cranial nerves grossly intact. moves all 4 extremities w/o difficulty. Affect pleasant ? ? ?Telemetry  ? ?Sinus brady, mid 17s, personally reviewed  ? ?EKG  ?  ?No new EKG to  review  ? ?Labs  ?  ?CBC ?Recent Labs  ?  08/03/21 ?1035 08/03/21 ?1109 08/04/21 ?0145 08/04/21 ?0149 08/04/21 ?0303  ?WBC 13.9*  --  13.4*  --   --   ?NEUTROABS 11.2*  --   --   --   --   ?HGB 12.6   < > 11.7* 12.2 11.9*  ?HCT 41.3   < > 36.8 36.0 35.0*  ?MCV 96.3  --  94.1  --   --   ?PLT 428*  --  333  --   --   ? < > = values in this interval not displayed.  ? ?Basic Metabolic Panel ?Recent Labs  ?  08/03/21 ?1356 08/03/21 ?1646 08/03/21 ?2150 08/04/21 ?0145 08/04/21 ?0149 08/04/21 ?0303  ?NA  --    < > 137 138 139 139  ?K  --    < > 4.7 4.5 4.5 4.5  ?CL  --   --  109 110  --   --   ?CO2  --   --  22 22  --   --   ?GLUCOSE  --   --  118* 121*  --   --   ?BUN  --   --  22 24*  --   --   ?CREATININE  --   --  1.26* 1.13*  --   --   ?CALCIUM  --   --  8.0*  8.1*  --   --   ?MG 2.4  --   --  2.3  --   --   ?PHOS  --   --   --  4.1  --   --   ? < > = values in this interval not displayed.  ? ?Liver Function Tests ?Recent Labs  ?  08/03/21 ?1159 08/04/21 ?0145  ?AST 239* 108*  ?ALT 60* 49*  ?ALKPHOS 106 92  ?BILITOT 0.8 0.7  ?PROT 6.8 5.6*  ?ALBUMIN 3.7 3.0*  ? ?No results for input(s): LIPASE, AMYLASE in the last 72 hours. ?Cardiac Enzymes ?No results for input(s): CKTOTAL, CKMB, CKMBINDEX, TROPONINI in the last 72 hours. ? ?BNP: ?BNP (last 3 results) ?Recent Labs  ?  08/03/21 ?1035  ?BNP 304.1*  ? ? ?ProBNP (last 3 results) ?No results for input(s): PROBNP in the last 8760 hours. ? ? ?D-Dimer ?No results for input(s): DDIMER in the last 72 hours. ?Hemoglobin A1C ?No results for input(s): HGBA1C in the last 72 hours. ?Fasting Lipid Panel ?No results for input(s): CHOL, HDL, LDLCALC, TRIG, CHOLHDL, LDLDIRECT in the last 72 hours. ?Thyroid Function Tests ?No results for input(s): TSH, T4TOTAL, T3FREE, THYROIDAB in the last 72 hours. ? ?Invalid input(s): FREET3 ? ?Other results: ? ? ?Imaging  ? ? ?DG Abd 1 View ? ?Result Date: 08/03/2021 ?CLINICAL DATA:  Orogastric tube placement. EXAM: ABDOMEN - 1 VIEW COMPARISON:  CT  examination performed earlier on the same date. FINDINGS: The bowel gas pattern is normal. Feeding tube coursing below the diaphragm with side port and distal tip projecting over the stomach. IMPRESSION: Feeding tube with distal tip and side port projecting over the body of the stomach. Electronically Signed   By: Keane Police D.O.   On: 08/03/2021 17:53  ? ?CT Head Wo Contrast ? ?Result Date: 08/03/2021 ?CLINICAL DATA:  Mental status changes of unknown cause EXAM: CT HEAD WITHOUT CONTRAST TECHNIQUE: Contiguous axial images were obtained from the base of the skull through the vertex without intravenous contrast. RADIATION DOSE REDUCTION: This exam was performed according to the departmental dose-optimization program which includes automated exposure control, adjustment of the mA and/or kV according to patient size and/or use of iterative reconstruction technique. COMPARISON:  02/12/2021 FINDINGS: Brain: Generalized atrophy. Normal ventricular morphology. No midline shift or mass effect. Small vessel chronic ischemic changes of deep cerebral white matter. No intracranial hemorrhage, mass lesion, evidence of acute infarction, or extra-axial fluid collection. Vascular: Atherosclerotic calcification of internal carotid and vertebral arteries at skull base Skull: Intact Sinuses/Orbits: Clear Other: N/A IMPRESSION: Atrophy with small vessel chronic ischemic changes of deep cerebral white matter. No acute intracranial abnormalities. Electronically Signed   By: Lavonia Dana [name]   On: 08/03/2021 15:13  ? ?CT Angio Chest Pulmonary Embolism (PE) W or WO Contrast ? ?Result Date: 08/03/2021 ?CLINICAL DATA:  Chest and abdominal pain. EXAM: CT ANGIOGRAPHY CHEST CT ABDOMEN AND PELVIS WITH CONTRAST TECHNIQUE: Multidetector CT imaging of the chest was performed using the standard protocol during bolus administration of intravenous contrast. Multiplanar CT image reconstructions and MIPs were obtained to evaluate the vascular anatomy.  Multidetector CT imaging of the abdomen and pelvis was performed using the standard protocol during bolus administration of intravenous contrast. RADIATION DOSE REDUCTION: This exam was performed according to the departmental dose-optimization program which includes automated exposure control, adjustment of the mA and/or kV according to patient size and/or use of iterative reconstruction technique. CONTRAST:  75m OMNIPAQUE IOHEXOL 350 MG/ML SOLN COMPARISON:  Abdominal CT scan from 03/03/2019. FINDINGS: CTA CHEST FINDINGS Cardiovascular: The heart is enlarged. No pericardial effusion. The aorta is normal in caliber. No dissection. Moderate atherosclerotic calcifications. Extensive coronary artery calcifications. The pulmonary arterial tree is well opacified. No filling defects to suggest pulmonary embolism. There is marked dilatation of the main pancreatic trunk which measures 5.6 cm in diameter. Findings highly suspicious for pulmonary hypertension. Mediastinum/Nodes: Borderline enlarged mediastinal and hilar lymph nodes likely related to the underlying lung findings. The esophagus is grossly normal. There is an NG tube coursing down the esophagus and into the stomach. ET tube is also present in the mid tracheal level. Lungs/Pleura: Small bilateral pleural effusions and bilateral infiltrates. Aspiration would certainly be a consideration. No worrisome pulmonary lesions. Musculoskeletal: No significant bony findings. Review of the MIP images confirms the above findings. CT ABDOMEN and PELVIS FINDINGS Hepatobiliary: No hepatic lesions are identified. Moderate periportal edema noted. No biliary dilatation. The gallbladder is surgically absent. No common bile duct dilatation. Pancreas: No mass, inflammation or ductal dilatation. Spleen: Normal size.  No focal lesions. Adrenals/Urinary Tract: Adrenal glands and kidneys are unremarkable and stable. Stable bilateral renal calculi. 3 cm low-attenuation lesion involving the  left kidney measures 36 Hounsfield units. This could be due to hemorrhage or enhancement. There was also present on the prior CT scan from 2020 and measured 1.7 cm. It measures as simple fluid on that study.

## 2021-08-04 NOTE — Progress Notes (Signed)
Initial Nutrition Assessment ? ?DOCUMENTATION CODES:  ? ?Obesity unspecified ? ?INTERVENTION:  ? ?Initiate tube feeds via OG tube: ?- Vital 1.5 @ 55 ml/hr (1320 ml/day) ?- ProSource TF 45 ml BID ? ?Tube feeding regimen provides 2060 kcal, 111 grams of protein, and 1008 ml of H2O.  ? ?NUTRITION DIAGNOSIS:  ? ?Inadequate oral intake related to inability to eat as evidenced by NPO status. ? ?GOAL:  ? ?Patient will meet greater than or equal to 90% of their needs ? ?MONITOR:  ? ?Vent status, Labs, Weight trends, TF tolerance, I & O's ? ?REASON FOR ASSESSMENT:  ? ?Ventilator, Consult ?Enteral/tube feeding initiation and management ? ?ASSESSMENT:  ? ?81 year old female who presented to the ED on 4/06 with AMS. PMH of HTN, CHF, CAD, asthma, T2DM, GERD, ulcerative colitis, fibromyalgia. Pt required intubation in the ED and found to have mixed septic and cardiogenic shock, acute systolic heart failure, CAP, AKI. ? ?Consult received for tube feeding initiation and management. Pt with OG tube tip and side port in the stomach. ? ?Spoke with pt's family member at bedside. They report pt had a fair appetite PTA and would try to eat 3 meals daily. Pt's portion sizes have gotten smaller as she has gotten older. Pt typically eats cereal and a banana for breakfast. Lunch typically includes something light like a sandwich. Pt usually eats a balanced dinner. Pt's family member notes pt has lost weight over time but no recent drastic weight changes. ? ?Reviewed weight history in chart. Current weight is up slightly compared to weights over the last year. Pt with non-pitting edema to BLE so suspect current weight is above dry weight. Suspect EDW is closer to 90-95 kg based on weights from the last year. ? ?Patient is currently intubated on ventilator support ?MV: 10.3 L/min ?Temp (24hrs), Avg:98.9 ?F (37.2 ?C), Min:98.4 ?F (36.9 ?C), Max:100.8 ?F (38.2 ?C) ? ?Drips: ?Precedex ? ?Medications reviewed and include: colace, SSI q 4 hours, IV  protonix, miralax, IV abx ? ?Labs reviewed: BUN 24, creatinine 1.13, elevated LFTs, WBC 13.4 ?CBG's: 109-200 x 24 hours ? ?UOP: 1105 ml x 24 hours ?OG tube: 50 ml x 12 hours ?I/O's: +1.3 L since admit ? ?NUTRITION - FOCUSED PHYSICAL EXAM: ? ?Flowsheet Row Most Recent Value  ?Orbital Region No depletion  ?Upper Arm Region No depletion  ?Thoracic and Lumbar Region No depletion  ?Buccal Region Unable to assess  ?Temple Region No depletion  ?Clavicle Bone Region Mild depletion  ?Clavicle and Acromion Bone Region Mild depletion  ?Scapular Bone Region Mild depletion  ?Dorsal Hand No depletion  ?Patellar Region No depletion  ?Anterior Thigh Region Mild depletion  ?Posterior Calf Region No depletion  ?Edema (RD Assessment) Mild  [BLE]  ?Hair Reviewed  ?Eyes Reviewed  ?Mouth Reviewed  ?Skin Reviewed  ?Nails Reviewed  ? ?  ? ? ?Diet Order:   ?Diet Order   ? ?       ?  Diet NPO time specified  Diet effective now       ?  ? ?  ?  ? ?  ? ? ?EDUCATION NEEDS:  ? ?No education needs have been identified at this time ? ?Skin:  Skin Assessment: Reviewed RN Assessment ? ?Last BM:  no documented BM ? ?Height:  ? ?Ht Readings from Last 1 Encounters:  ?08/03/21 5' 1" (1.549 m)  ? ? ?Weight:  ? ?Wt Readings from Last 1 Encounters:  ?08/04/21 103.2 kg  ? ? ?Ideal Body Weight:  47.7 kg ? ?BMI:  Body mass index is 42.99 kg/m?. ? ?Estimated Nutritional Needs:  ? ?Kcal:  1900-2100 ? ?Protein:  100-120 grams ? ?Fluid:  >1.8 L ? ? ? ?Gustavus Bryant, MS, RD, LDN ?Inpatient Clinical Dietitian ?Please see AMiON for contact information. ? ?

## 2021-08-04 NOTE — Progress Notes (Signed)
Pharmacy Antibiotic Note ? ?[name] is a 81 y.o. female admitted on 08/03/2021 with sepsis.  CT with pneumonia WBC 13 Tm 101.3. Pharmacy has been consulted for cefepime and vancomycin dosing. ?Cr improved 1.7>1.1  Crcl 2m/min ? ?Plan: ?Cefepime 2g IV q12h ?Vancomycin 10034mIV q24 ?Monitor CBC and BMP daily ?Follow up cultures ?Monitor for other clinical s/sx of infection ? ?Height: _0  (154.9 cm) ?Weight: 103.2 kg (227 lb 8.2 oz) ?IBW/kg (Calculated) : 47.8 ? ?Temp (24hrs), Avg:98.9 ?F (37.2 ?C), Min:98.4 ?F (36.9 ?C), Max:99.5 ?F (37.5 ?C) ? ?Recent Labs  ?Lab 08/03/21 ?1035 08/03/21 ?1159 08/03/21 ?1356 08/03/21 ?1802 08/03/21 ?2150 08/04/21 ?0145  ?WBC 13.9*  --   --   --   --  13.4*  ?CREATININE  --  1.69*  --   --  1.26* 1.13*  ?LATICACIDVEN 3.0*  --  3.3* 1.3  --   --   ? ?  ?Estimated Creatinine Clearance: 44.6 mL/min (A) (by C-G formula based on SCr of 1.13 mg/dL (H)).   ? ?Allergies  ?Allergen Reactions  ? Doxycycline Other (See Comments)  ?  Unsteady gait  ? Aspirin Nausea And Vomiting  ? Azithromycin Other (See Comments)  ?  REACTION: pt states "ZPak doesn't work"  ? Caffeine Nausea And Vomiting  ? Ciprofloxacin Nausea And Vomiting  ?  Tolerates levaquin  ? Codeine Nausea And Vomiting  ? Oxycodone Other (See Comments)  ?  Pt stated, "upsets my stomach" (11/19/17)  ? Sulfamethizole Other (See Comments)  ?  Unknown reaction  ? Tramadol Hcl Other (See Comments)  ?  REACTION: pt states "spaced-out"  ? ? ?Antimicrobials this admission: ?Cefepime 4/6 >>  ?Metronidazole 4/6 >>  ?Vancomycin 4/6 >>  ? ?Dose adjustments this admission: ?N/A ? ?Microbiology results: ?4/6 BCx: ordered ?4/6 MRSA PCR: ordered ? ? ?LiBonnita Nasutiharm.D. CPP, BCPS ?Clinical Pharmacist ?[phone]/10/2021 3:44 PM  ? ? ?

## 2021-08-04 NOTE — Plan of Care (Signed)
Stable during shift. Woke up, MAE to command, nods yes/no. Agitation from ETT, attempting to pull out ETT despite safety mitts, bilat soft limb wrist restraints on for pt safety. Responded well to fentanyl 50 mcg IV x 3 and low dose precedex gtt. Calm and cooperative. BP stable, levo gtt weaned off. SB/SR on monitor (paused and decr precedex at times d/t bradycardia).  ? ?Foley, 1,155 ml/24 hr.  ? ?Hypoxemia on ABG, incr FiO2 on vent. Sats on monitor stable. Coox incr to 67.7. CVP 10-13.  ? ?Turning q 2 for comfort and skin integrity.  ? ? ?Problem: Safety: ?Goal: Non-violent Restraint(s) ?Outcome: Progressing ?  ?Problem: Clinical Measurements: ?Goal: Will remain free from infection ?Outcome: Progressing ?Goal: Respiratory complications will improve ?Outcome: Progressing ?Goal: Cardiovascular complication will be avoided ?Outcome: Progressing ?  ?Problem: Pain Managment: ?Goal: General experience of comfort will improve ?Outcome: Progressing ?  ?Problem: Safety: ?Goal: Ability to remain free from injury will improve ?Outcome: Progressing ?  ?Problem: Skin Integrity: ?Goal: Risk for impaired skin integrity will decrease ?Outcome: Progressing ?  ?

## 2021-08-04 NOTE — TOC Progression Note (Signed)
Transition of Care (TOC) - Progression Note  ? ? ?Patient Details  ?Name: [name] ?MRN: [id number] ?Date of Birth: 12-05-1940 ? ?Transition of Care (TOC) CM/SW Contact  ?Zenon Mayo, RN ?Phone Number: ?08/04/2021, 8:12 AM ? ?Clinical Narrative:    ? ?Transition of Care (TOC) Screening Note ? ? ?Patient Details  ?Name: [name] ?Date of Birth: 1941/03/22 ? ? ?Transition of Care (TOC) CM/SW Contact:    ?Zenon Mayo, RN ?Phone Number: ?08/04/2021, 8:12 AM ? ? ? ?Transition of Care Department [facility]) has reviewed patient and no TOC needs have been identified at this time. We will continue to monitor patient advancement through interdisciplinary progression rounds. If new patient transition needs arise, please place a TOC consult. ?  ? ? ?  ?  ? ?Expected Discharge Plan and Services ?  ?  ?  ?  ?  ?                ?  ?  ?  ?  ?  ?  ?  ?  ?  ?  ? ? ?Social Determinants of Health (SDOH) Interventions ?  ? ?Readmission Risk Interventions ?   ? View : No data to display.  ?  ?  ?  ? ? ?

## 2021-08-05 ENCOUNTER — Inpatient Hospital Stay (HOSPITAL_COMMUNITY): Payer: PPO

## 2021-08-05 DIAGNOSIS — A419 Sepsis, unspecified organism: Secondary | ICD-10-CM | POA: Diagnosis not present

## 2021-08-05 DIAGNOSIS — R6521 Severe sepsis with septic shock: Secondary | ICD-10-CM | POA: Diagnosis not present

## 2021-08-05 DIAGNOSIS — R579 Shock, unspecified: Secondary | ICD-10-CM | POA: Diagnosis not present

## 2021-08-05 LAB — MAGNESIUM
Magnesium: 2.3 mg/dL (ref 1.7–2.4)
Magnesium: 1.8 mg/dL (ref 1.7–2.4)

## 2021-08-05 LAB — COOXEMETRY PANEL
Carboxyhemoglobin: 1.1 % (ref 0.5–1.5)
Methemoglobin: 0.7 % (ref 0.0–1.5)
O2 Saturation: 64.5 %
Total hemoglobin: 12.1 g/dL (ref 12.0–16.0)

## 2021-08-05 LAB — BASIC METABOLIC PANEL
Anion gap: 4 — ABNORMAL LOW (ref 5–15)
BUN: 16 mg/dL (ref 8–23)
CO2: 24 mmol/L (ref 22–32)
Calcium: 8 mg/dL — ABNORMAL LOW (ref 8.9–10.3)
Chloride: 111 mmol/L (ref 98–111)
Creatinine, Ser: 0.54 mg/dL (ref 0.44–1.00)
GFR, Estimated: >60 mL/min (ref >60)
Glucose, Bld: 239 mg/dL — ABNORMAL HIGH (ref 70–99)
Potassium: 4 mmol/L (ref 3.5–5.1)
Sodium: 139 mmol/L (ref 135–145)

## 2021-08-05 LAB — GLUCOSE, CAPILLARY
Glucose-Capillary: 192 mg/dL — ABNORMAL HIGH (ref 70–99)
Glucose-Capillary: 222 mg/dL — ABNORMAL HIGH (ref 70–99)
Glucose-Capillary: 196 mg/dL — ABNORMAL HIGH (ref 70–99)
Glucose-Capillary: 203 mg/dL — ABNORMAL HIGH (ref 70–99)
Glucose-Capillary: 156 mg/dL — ABNORMAL HIGH (ref 70–99)

## 2021-08-05 LAB — PHOSPHORUS
Phosphorus: 3.1 mg/dL (ref 2.5–4.6)
Phosphorus: 3.5 mg/dL (ref 2.5–4.6)

## 2021-08-05 MED ORDER — FUROSEMIDE 10 MG/ML IJ SOLN
40.0000 mg | Freq: Once | INTRAMUSCULAR | Status: AC
Start: 2021-08-05 — End: 2021-08-05
  Administered 2021-08-05: 40 mg via INTRAVENOUS
  Filled 2021-08-05: qty 4

## 2021-08-05 MED ORDER — FENTANYL BOLUS VIA INFUSION
25.0000 ug | INTRAVENOUS | Status: DC | PRN
  Administered 2021-08-06: 50 ug via INTRAVENOUS
  Administered 2021-08-06 (×2): 100 ug via INTRAVENOUS
  Filled 2021-08-05: qty 100

## 2021-08-05 MED ORDER — FUROSEMIDE 10 MG/ML IJ SOLN
40.0000 mg | Freq: Once | INTRAMUSCULAR | Status: AC
  Administered 2021-08-05: 40 mg via INTRAVENOUS
  Filled 2021-08-05: qty 4

## 2021-08-05 MED ORDER — INSULIN ASPART 100 UNIT/ML IJ SOLN
2.0000 [IU] | INTRAMUSCULAR | Status: DC
  Administered 2021-08-05: 4 [IU] via SUBCUTANEOUS
  Administered 2021-08-06 (×2): 6 [IU] via SUBCUTANEOUS
  Administered 2021-08-06: 4 [IU] via SUBCUTANEOUS
  Administered 2021-08-06: 6 [IU] via SUBCUTANEOUS
  Administered 2021-08-06 – 2021-08-07 (×3): 2 [IU] via SUBCUTANEOUS

## 2021-08-05 MED ORDER — GABAPENTIN 600 MG PO TABS: 300.0000 mg | ORAL_TABLET | Freq: Two times a day (BID) | ORAL | Status: DC

## 2021-08-05 MED ORDER — PANTOPRAZOLE 2 MG/ML SUSPENSION
40.0000 mg | Freq: Every day | ORAL | Status: DC
  Administered 2021-08-05 – 2021-08-06 (×2): 40 mg
  Filled 2021-08-05 (×2): qty 20

## 2021-08-05 MED ORDER — GABAPENTIN 250 MG/5ML PO SOLN
300.0000 mg | Freq: Two times a day (BID) | ORAL | Status: DC
  Administered 2021-08-05 – 2021-08-06 (×3): 300 mg
  Filled 2021-08-05 (×4): qty 6

## 2021-08-05 NOTE — Progress Notes (Signed)
? ?NAME:  [name], MRN:  [id number], DOB:  October 19, 1940, LOS: 2 ?ADMISSION DATE:  08/03/2021 CONSULTATION DATE:  08/03/2021 ?REFERRING MD:  Maryan Rued - EDP CHIEF COMPLAINT:  AMS, hypoxia, shock  ? ?History of Present Illness:  ?81 year old woman who presented to [facility] ED 4/6 via EMS.  LKN 2100 4/5. Found unresponsive by family, breathing spontaneously, hypoxic with SpO2 40%. .  ? ?PMHx significant for HTN, HLD, CHF (Echo 03/2021 with EF 65-70%, grade 1 DD), CAD (s/p LAD stent 2002), asthma, T2DM, diverticulosis, renal lesion (stable, RCC vs. angiomyolipoma), GERD, fibromyalgia. ? ? ? Patient's daughter had been with her all day yesterday (4/5) shopping and seemingly normal, patient's only complaint was nausea which caused her to vomit x 1.  She reportedly felt better after vomiting and did not have any further episodes.  No complaints of upper respiratory symptoms, fever/chills, CP/SOB, changes in bowel habits or known sick contacts ? ?Required intubation and initiation of NE.  ? ?Pertinent Medical History:  ? ?Past Medical History:  ?Diagnosis Date  ? Adenomatous colon polyp 02/2006  ? Allergy   ? Allergy, unspecified not elsewhere classified   ? Anemia   ? Anxiety   ? Arthritis   ? Asthma   ? Blood transfusion without reported diagnosis   ? Bronchiectasis   ? CAD (coronary artery disease)   ? BMS to the LAD, 2002; cath 12/07/11 patent LAD stent and mild nonobstructive disease, EF 65%; Medical management  ? Cataract   ? removed both eyes  ? CHF (congestive heart failure) ([place])   ? Diastolic  ? Clotting disorder ([place])   ? in legs per pt   ? Diabetes mellitus   ? Diverticulosis of colon   ? DJD (degenerative joint disease)   ? Fibromyalgia   ? GERD (gastroesophageal reflux disease)   ? Hypercholesterolemia   ? Hypertension   ? Microscopic hematuria   ? Neoplasm of kidney   ? Obesity   ? Other diseases of lung, not elsewhere classified   ? Pinched vertebral nerve   ? Pulmonary nodule   ? Negative PET in 2010  ? Stress  incontinence, female   ? Syncope   ? Ulcerative colitis, left sided [name]) 2008  ? Hx of  ? UTI (lower urinary tract infection)   ? Vitamin D deficiency disease   ? ?Significant Hospital Events: ?Including procedures, antibiotic start and stop dates in addition to other pertinent events   ?4/5 2100 LKW ?4/6 Unresponsive at home, hypoxic to 40%. BIB EMS to ED, febrile/hypotensive, bradycardic/hypoxic. Intubated, RIJ CVC placed. Levo and Vaso started. Limited Echo with EF 37.9%, new HF.  ?4/6 CT chest shows no pulmonary embolism. Obese patient. Bibasilar and RML infiltrates. RV not enlarged but RA is on CT  ?4/6 CT head is negative, Repeat abdominal CT for cyst evaluation recommended in 3 months. ?4/7 increased sedation for agitation.  ? ?Interim History / Subjective:  ? ?Increased sedation for agitation. Pulled arterial line. Ventilator wean to 0.5/10 but sat 90%.  ? ?Objective:  ?Blood pressure (!) 144/67, pulse (!) 52, temperature 98.8 ?F (37.1 ?C), resp. rate (!) 26, height _0  (1.549 m), weight 104.2 kg, SpO2 93 %. ?CVP:  [8 mmHg-34 mmHg] 34 mmHg  ?Vent Mode: PRVC ?FiO2 (%):  [40 %-60 %] 50 % ?Set Rate:  [26 bmp] 26 bmp ?Vt Set:  [380 mL] 380 mL ?PEEP:  [10 cmH20] 10 cmH20 ?Plateau Pressure:  [21 cmH20-23 cmH20] 21 cmH20  ? ?Intake/Output Summary (Last 24  hours) at 08/05/2021 5056 ?Last data filed at 08/05/2021 0600 ?Gross per 24 hour  ?Intake 2150.37 ml  ?Output 850 ml  ?Net 1300.37 ml  ? ? ?Filed Weights  ? 08/03/21 2207 08/04/21 0500 08/05/21 0340  ?Weight: 103 kg 103.2 kg 104.2 kg  ? ? ?Physical Examination: ?General: Acutely ill-appearing elderly woman in NAD. Grossly edematous. ?HEENT:. ETT/OGT in place. ?Neuro: Sedated but follows commands and attempts to communicate verbally.  ?CV: HS normal, extremities warm.  ?PULM: Breathing even and unlabored on vent. Crackles at right base. On  ?GI: Soft, nontender, mildly distended. Hypoactive bowel sounds. ?Extremities: Bilateral asymmetric LE edema noted (1-2+, L > R  baseline per daughter). ?Skin: Warm/dry, no rashes. ? ?Ancillary testing personally reviewed:  ? ?Assessment & Plan:  ? ?Critically ill due to mixed septic and cardiogenic shock from CAP and acute on chronic cor pulmonale ?Critically ill due to acute hypoxic respiratory failure requiring mechanical ventilation.  ?Suspect that patient may have untreated sleep apnea.  ?Community acquired pneumonia.  ?CAD ?Acute systolic heart failure ?AKI ?Mild transaminitis - likely from congestion from RV dysfunction with TR +++ ?T2DM ?GERD ? ?Plan:  ? ?- Given that shock resolved with mechanical ventilation, suspect significant component of acute cor pulmonale causing cardiogenic shock. ?- As such best to clear acute respiratory failure component prior to evaluating chronic component of RV failure/PH.  ?- Continue empiric treatment for CAP  ?- Diurese today ?- Increased enteral sedation, restarted home gabapentin for left arm pain from cervical spondylosis.  ? ?Best Practice: (right click and "Reselect all SmartList Selections" daily)  ? ?Diet/type: on tube feeds.  ?DVT prophylaxis: prophylactic heparin  ?GI prophylaxis: PPI ?Lines: Central line - RIJ CVC ?Foley:  Yes, and it is still needed ?Code Status:  full code ?Last date of multidisciplinary goals of care discussion [Family updated at bedside 4/8 by Drs Lynetta Mare and BenSimhon] ? ?CRITICAL CARE ?Performed by: Kipp Brood ? ? ?Total critical care time: 35 minutes ? ?Critical care time was exclusive of separately billable procedures and treating other patients. ? ?Critical care was necessary to treat or prevent imminent or life-threatening deterioration. ? ?Critical care was time spent personally by me on the following activities: development of treatment plan with patient and/or surrogate as well as nursing, discussions with consultants, evaluation of patient's response to treatment, examination of patient, obtaining history from patient or surrogate, ordering and performing  treatments and interventions, ordering and review of laboratory studies, ordering and review of radiographic studies, pulse oximetry, re-evaluation of patient's condition and participation in multidisciplinary rounds. ? ?Kipp Brood, MD FRCPC ?ICU Physician ?[place]  ?Pager: [phone] ?Mobile: [phone] ?After hours: [phone]. ? ?

## 2021-08-05 NOTE — Plan of Care (Signed)

## 2021-08-05 NOTE — Progress Notes (Signed)
eLink Physician-Brief Progress Note ?Patient Name: [name] ?DOB: 15-Mar-1941 ?MRN: [id number] ? ? ?Date of Service ? 08/05/2021  ?HPI/Events of Note ? Received request to changed Fentanyl bolus via infusion  ?eICU Interventions ? Order placed. Discontinued the previously ordered Fentanyl pushes. ?Discussed with bedside RN  ? ? ? ?Intervention Category ?Intermediate Interventions: Other: ? ?Judd Lien ?08/05/2021, 11:14 PM ?

## 2021-08-05 NOTE — Progress Notes (Signed)
? ? Advanced Heart Failure Rounding Note ? ?PCP-Cardiologist: Candee Furbish, MD  ? ?Subjective:   ? ?Remains on vent. Sedated. Off NE.  ? ?Vent at 50% (down from 80%) Sats 91%  Co-ox 65% ? ?Renal function has normalized. CVP 11. (Measured personally) ? ? ?Objective:   ?Weight Range: ?104.2 kg ?Body mass index is 43.41 kg/m?.  ? ?Vital Signs:   ?Temp:  [98.8 ?F (37.1 ?C)-99.5 ?F (37.5 ?C)] 99 ?F (37.2 ?C) (04/08 1030) ?Pulse Rate:  [51-173] 53 (04/08 1030) ?Resp:  [17-32] 27 (04/08 1030) ?BP: (133-160)/(66-81) 141/76 (04/08 1000) ?SpO2:  [88 %-100 %] 91 % (04/08 1030) ?Arterial Line BP: (157)/(75) 157/75 (04/07 1200) ?FiO2 (%):  [40 %-60 %] 50 % (04/08 0805) ?Weight:  [104.2 kg] 104.2 kg (04/08 0340) ?Last BM Date :  (prior to admit) ? ?Weight change: ?Filed Weights  ? 08/03/21 2207 08/04/21 0500 08/05/21 0340  ?Weight: 103 kg 103.2 kg 104.2 kg  ? ? ?Intake/Output:  ? ?Intake/Output Summary (Last 24 hours) at 08/05/2021 1044 ?Last data filed at 08/05/2021 0600 ?Gross per 24 hour  ?Intake 2090.7 ml  ?Output 850 ml  ?Net 1240.7 ml  ? ?  ? ? ?Physical Exam  ?  ?General:  Sedated on vent ?HEENT: normal +ETT ?Neck: supple.JVP jaw  Carotids 2+ bilat; no bruits. No lymphadenopathy or thryomegaly appreciated. ?Cor: PMI nondisplaced. Regular rate & rhythm. No rubs, gallops or murmurs. ?Lungs: clear ?Abdomen: obese soft, nontender, nondistended. No hepatosplenomegaly. No bruits or masses. Good bowel sounds. ?Extremities: no cyanosis, clubbing, rash, edema ?Neuro: sedated on vent ?_0 ?Telemetry  ? ?Sinus brady, 50-60 Personally reviewed ? ? ?Labs  ?  ?CBC ?Recent Labs  ?  08/03/21 ?1035 08/03/21 ?1109 08/04/21 ?0145 08/04/21 ?0149 08/04/21 ?0303 08/04/21 ?1144  ?WBC 13.9*  --  13.4*  --   --   --   ?NEUTROABS 11.2*  --   --   --   --   --   ?HGB 12.6   < > 11.7*   < > 11.9* 11.9*  ?HCT 41.3   < > 36.8   < > 35.0* 35.0*  ?MCV 96.3  --  94.1  --   --   --   ?PLT 428*  --  333  --   --   --   ? < > = values in this interval not  displayed.  ? ? ?Basic Metabolic Panel ?Recent Labs  ?  08/04/21 ?0145 08/04/21 ?0149 08/04/21 ?1144 08/04/21 ?1338 08/04/21 ?1641 08/05/21 ?0345 08/05/21 ?0938  ?NA 138   < > 140  --   --   --  139  ?K 4.5   < > 4.3  --   --   --  4.0  ?CL 110  --   --   --   --   --  111  ?CO2 22  --   --   --   --   --  24  ?GLUCOSE 121*  --   --   --   --   --  239*  ?BUN 24*  --   --   --   --   --  16  ?CREATININE 1.13*  --   --   --   --   --  0.54  ?CALCIUM 8.1*  --   --   --   --   --  8.0*  ?MG 2.3  --   --    < > 2.4 2.3  --   ?  PHOS 4.1  --   --    < > 3.5 3.1  --   ? < > = values in this interval not displayed.  ? ? ?Liver Function Tests ?Recent Labs  ?  08/03/21 ?1159 08/04/21 ?0145  ?AST 239* 108*  ?ALT 60* 49*  ?ALKPHOS 106 92  ?BILITOT 0.8 0.7  ?PROT 6.8 5.6*  ?ALBUMIN 3.7 3.0*  ? ? ?No results for input(s): LIPASE, AMYLASE in the last 72 hours. ?Cardiac Enzymes ?No results for input(s): CKTOTAL, CKMB, CKMBINDEX, TROPONINI in the last 72 hours. ? ?BNP: ?BNP (last 3 results) ?Recent Labs  ?  08/03/21 ?1035  ?BNP 304.1*  ? ? ? ?ProBNP (last 3 results) ?No results for input(s): PROBNP in the last 8760 hours. ? ? ?D-Dimer ?No results for input(s): DDIMER in the last 72 hours. ?Hemoglobin A1C ?No results for input(s): HGBA1C in the last 72 hours. ?Fasting Lipid Panel ?No results for input(s): CHOL, HDL, LDLCALC, TRIG, CHOLHDL, LDLDIRECT in the last 72 hours. ?Thyroid Function Tests ?No results for input(s): TSH, T4TOTAL, T3FREE, THYROIDAB in the last 72 hours. ? ?Invalid input(s): FREET3 ? ?Other results: ? ? ?Imaging  ? ? ?DG CHEST PORT 1 VIEW ? ?Result Date: 08/05/2021 ?CLINICAL DATA:  Respiratory failure with hypoxia EXAM: PORTABLE CHEST 1 VIEW COMPARISON:  Yesterday FINDINGS: Endotracheal tube with tip between the clavicular heads and carina. The enteric tube tip is at the level of the distal esophagus. Right IJ line with tip at the SVC. Cardiomegaly and vascular pedicle widening with enlarged main pulmonary artery  contour. Haziness of the bilateral chest attributed to atelectasis and pleural fluid, likely with pulmonary edema. These results will be called to the ordering clinician or representative by the Radiologist Assistant, and communication documented in the PACS or [employer]. IMPRESSION: 1. Shortening of the enteric tube with tip and side port at the esophagus. 2. Cardiomegaly with pleural fluid and atelectasis. Vascular congestion/edema. Electronically Signed   By: Jorje Guild [name]   On: 08/05/2021 10:35   ? ? ?Medications:   ? ? ?Scheduled Medications: ? chlorhexidine gluconate (MEDLINE KIT)  15 mL Mouth Rinse BID  ? Chlorhexidine Gluconate Cloth  6 each Topical Daily  ? Chlorhexidine Gluconate Cloth  6 each Topical Daily  ? docusate  100 mg Per Tube BID  ? feeding supplement (PROSource TF)  45 mL Per Tube BID  ? gabapentin  300 mg Per Tube Q12H  ? heparin  5,000 Units Subcutaneous Q8H  ? insulin aspart  0-15 Units Subcutaneous Q4H  ? mouth rinse  15 mL Mouth Rinse 10 times per day  ? pantoprazole sodium  40 mg Per Tube Daily  ? polyethylene glycol  17 g Per Tube Daily  ? QUEtiapine  25 mg Per Tube QHS  ? sodium chloride flush  10-40 mL Intracatheter Q12H  ? sodium chloride flush  3 mL Intravenous Q12H  ? ? ?Infusions: ? sodium chloride Stopped (08/05/21 0013)  ? sodium chloride    ? sodium chloride    ? ceFEPime (MAXIPIME) IV 2 g (08/05/21 1001)  ? dexmedetomidine (PRECEDEX) IV infusion 1 mcg/kg/hr (08/05/21 0930)  ? feeding supplement (VITAL 1.5 CAL) 1,000 mL (08/04/21 2354)  ? fentaNYL infusion INTRAVENOUS 200 mcg/hr (08/05/21 0600)  ? vancomycin Stopped (08/04/21 2136)  ? ? ?PRN Medications: ?Place/Maintain arterial line **AND** sodium chloride, Place/Maintain arterial line **AND** sodium chloride, etomidate, fentaNYL (SUBLIMAZE) injection, fentaNYL (SUBLIMAZE) injection, LORazepam, sodium chloride flush ? ? ? ?Patient Profile  ? ?81  y/o woman with DM2, UC, obesity, HFPEF, CAD with remote LAD stents,  fibromyalgia admitted with altered mental status and respiratory arrest. Found to have lactic acidosis and AKI. WBC 14K. Echo EF 45-50% with dilated and hypokinetic RV. CT no PE. +Evidence of significant PH. Developed junctional bradycardia in setting of hyperkalemia w/ k > 7. Admitted to CCU.  ? ?Assessment/Plan  ? ?1. Shock , unclear etiology. ? Septic +/- cardiogenic ?- Hypotensive on arrival. Placed on Norepi and given IV fluids.  ?- CTA negative for PE.  ?- Echo  45-50% RV dilated , septal flattening. Concern for RV failure/pulmonary HTN. Suspect preload dependent.  ?- Lactic Acid 3.0>3.3>1.3 ?- Suspect due to PNA in setting of underlying PAH/cor pulmonale  ?- Now off NE.  ?- Will need R/L heart cath once extubated ?  ?2. Acute Hypoxic Respiratory Failure with RML PNA ?- remains intubated, FiO2 50% ?- vent wean per PCCM  ?- Continue abx ?- will give lasix ?- D/w Dr. Lynetta Mare at bedside ? ?3. Pulmonary HTN -> cor pulmonale ?- Marked RV strain on echo  ?  ?4 Bradycardia  ?- Due to hyperkalemia  ?- Rate in the 30s on admit -->given atropine with improved rate. Now SB 54s  ?  ?5. Hyperkalemia  ?-K 7.9 on admit. Given calcium gluconate + Lokelma  ?-Improved, K 4.0 today  ?  ?6. H/O CAD ?-BMS prox LAD with 3 subsequent cath showing patent stent ?-HS Trop 131>136 ?-Plan R/LHC  when extubated ?  ?7. DMII ?Per CCM ? ?CRITICAL CARE ?Performed by: Glori Bickers ? ?Total critical care time: 35 minutes ? ?Critical care time was exclusive of separately billable procedures and treating other patients. ? ?Critical care was necessary to treat or prevent imminent or life-threatening deterioration. ? ?Critical care was time spent personally by me (independent of midlevel providers or residents) on the following activities: development of treatment plan with patient and/or surrogate as well as nursing, discussions with consultants, evaluation of patient's response to treatment, examination of patient, obtaining history from  patient or surrogate, ordering and performing treatments and interventions, ordering and review of laboratory studies, ordering and review of radiographic studies, pulse oximetry and re-evaluation of patient's condi

## 2021-08-06 ENCOUNTER — Encounter (HOSPITAL_COMMUNITY): Payer: Self-pay | Admitting: Pulmonary Disease

## 2021-08-06 DIAGNOSIS — I5021 Acute systolic (congestive) heart failure: Secondary | ICD-10-CM | POA: Diagnosis present

## 2021-08-06 DIAGNOSIS — G4733 Obstructive sleep apnea (adult) (pediatric): Secondary | ICD-10-CM | POA: Diagnosis present

## 2021-08-06 DIAGNOSIS — I2609 Other pulmonary embolism with acute cor pulmonale: Secondary | ICD-10-CM | POA: Diagnosis present

## 2021-08-06 DIAGNOSIS — R579 Shock, unspecified: Secondary | ICD-10-CM | POA: Diagnosis not present

## 2021-08-06 DIAGNOSIS — R6521 Severe sepsis with septic shock: Secondary | ICD-10-CM | POA: Diagnosis not present

## 2021-08-06 DIAGNOSIS — A419 Sepsis, unspecified organism: Secondary | ICD-10-CM | POA: Diagnosis not present

## 2021-08-06 DIAGNOSIS — J9601 Acute respiratory failure with hypoxia: Secondary | ICD-10-CM | POA: Diagnosis present

## 2021-08-06 DIAGNOSIS — M5412 Radiculopathy, cervical region: Secondary | ICD-10-CM | POA: Diagnosis present

## 2021-08-06 DIAGNOSIS — J189 Pneumonia, unspecified organism: Secondary | ICD-10-CM | POA: Diagnosis present

## 2021-08-06 LAB — CULTURE, RESPIRATORY W GRAM STAIN: Culture: NORMAL

## 2021-08-06 LAB — CBC WITH DIFFERENTIAL/PLATELET
Abs Immature Granulocytes: 0.05 10*3/uL (ref 0.00–0.07)
Basophils Absolute: 0 10*3/uL (ref 0.0–0.1)
Basophils Relative: 0 %
Eosinophils Absolute: 0.5 10*3/uL (ref 0.0–0.5)
Eosinophils Relative: 4 %
HCT: 37.4 % (ref 36.0–46.0)
Hemoglobin: 11.8 g/dL — ABNORMAL LOW (ref 12.0–15.0)
Immature Granulocytes: 0 %
Lymphocytes Relative: 7 %
Lymphs Abs: 0.9 10*3/uL (ref 0.7–4.0)
MCH: 29.2 pg (ref 26.0–34.0)
MCHC: 31.6 g/dL (ref 30.0–36.0)
MCV: 92.6 fL (ref 80.0–100.0)
Monocytes Absolute: 0.9 10*3/uL (ref 0.1–1.0)
Monocytes Relative: 7 %
Neutro Abs: 10.1 10*3/uL — ABNORMAL HIGH (ref 1.7–7.7)
Neutrophils Relative %: 82 %
Platelets: 355 10*3/uL (ref 150–400)
RBC: 4.04 MIL/uL (ref 3.87–5.11)
RDW: 14 % (ref 11.5–15.5)
WBC: 12.5 10*3/uL — ABNORMAL HIGH (ref 4.0–10.5)
nRBC: 0 % (ref 0.0–0.2)

## 2021-08-06 LAB — BASIC METABOLIC PANEL
Anion gap: 6 (ref 5–15)
BUN: 13 mg/dL (ref 8–23)
CO2: 28 mmol/L (ref 22–32)
Calcium: 8.5 mg/dL — ABNORMAL LOW (ref 8.9–10.3)
Chloride: 104 mmol/L (ref 98–111)
Creatinine, Ser: 0.58 mg/dL (ref 0.44–1.00)
GFR, Estimated: >60 mL/min (ref >60)
Glucose, Bld: 226 mg/dL — ABNORMAL HIGH (ref 70–99)
Potassium: 3.7 mmol/L (ref 3.5–5.1)
Sodium: 138 mmol/L (ref 135–145)

## 2021-08-06 LAB — COOXEMETRY PANEL
Carboxyhemoglobin: 1.4 % (ref 0.5–1.5)
Methemoglobin: <0.7 % (ref 0.0–1.5)
O2 Saturation: 73.8 %
Total hemoglobin: 12.1 g/dL (ref 12.0–16.0)

## 2021-08-06 LAB — GLUCOSE, CAPILLARY
Glucose-Capillary: 112 mg/dL — ABNORMAL HIGH (ref 70–99)
Glucose-Capillary: 136 mg/dL — ABNORMAL HIGH (ref 70–99)
Glucose-Capillary: 187 mg/dL — ABNORMAL HIGH (ref 70–99)
Glucose-Capillary: 203 mg/dL — ABNORMAL HIGH (ref 70–99)
Glucose-Capillary: 227 mg/dL — ABNORMAL HIGH (ref 70–99)
Glucose-Capillary: 227 mg/dL — ABNORMAL HIGH (ref 70–99)

## 2021-08-06 LAB — MAGNESIUM: Magnesium: 1.7 mg/dL (ref 1.7–2.4)

## 2021-08-06 MED ORDER — GABAPENTIN 300 MG PO CAPS
300.0000 mg | ORAL_CAPSULE | Freq: Two times a day (BID) | ORAL | Status: DC
  Administered 2021-08-06 – 2021-08-11 (×10): 300 mg via ORAL
  Filled 2021-08-06 (×10): qty 1

## 2021-08-06 MED ORDER — MAGNESIUM SULFATE 2 GM/50ML IV SOLN
2.0000 g | Freq: Once | INTRAVENOUS | Status: AC
  Administered 2021-08-06: 2 g via INTRAVENOUS
  Filled 2021-08-06: qty 50

## 2021-08-06 MED ORDER — PHENOL 1.4 % MT LIQD
1.0000 | OROMUCOSAL | Status: DC | PRN
  Administered 2021-08-06: 1 via OROMUCOSAL
  Filled 2021-08-06: qty 177

## 2021-08-06 MED ORDER — FUROSEMIDE 10 MG/ML IJ SOLN
40.0000 mg | Freq: Once | INTRAMUSCULAR | Status: AC
  Administered 2021-08-06: 40 mg via INTRAVENOUS
  Filled 2021-08-06: qty 4

## 2021-08-06 MED ORDER — SODIUM CHLORIDE 0.9 % IV SOLN: INTRAVENOUS | Status: DC

## 2021-08-06 MED ORDER — SODIUM CHLORIDE 0.9% FLUSH
3.0000 mL | INTRAVENOUS | Status: DC | PRN
Start: 2021-08-06 — End: 2021-08-07

## 2021-08-06 MED ORDER — SODIUM CHLORIDE 0.9 % IV SOLN: 250.0000 mL | INTRAVENOUS | Status: DC | PRN

## 2021-08-06 MED ORDER — ORAL CARE MOUTH RINSE
15.0000 mL | Freq: Two times a day (BID) | OROMUCOSAL | Status: DC
  Administered 2021-08-07 – 2021-08-11 (×9): 15 mL via OROMUCOSAL

## 2021-08-06 MED ORDER — POTASSIUM CHLORIDE 20 MEQ PO PACK
40.0000 meq | PACK | Freq: Once | ORAL | Status: AC
  Administered 2021-08-06: 40 meq
  Filled 2021-08-06: qty 2

## 2021-08-06 MED ORDER — ASPIRIN 81 MG PO CHEW
81.0000 mg | CHEWABLE_TABLET | ORAL | Status: AC
  Administered 2021-08-07: 81 mg via ORAL
  Filled 2021-08-06: qty 1

## 2021-08-06 MED ORDER — POTASSIUM CHLORIDE CRYS ER 20 MEQ PO TBCR: 20.0000 meq | EXTENDED_RELEASE_TABLET | Freq: Once | ORAL | Status: DC

## 2021-08-06 MED ORDER — POTASSIUM CHLORIDE CRYS ER 20 MEQ PO TBCR: 40.0000 meq | EXTENDED_RELEASE_TABLET | Freq: Once | ORAL | Status: DC

## 2021-08-06 NOTE — Progress Notes (Signed)
? ? Advanced Heart Failure Rounding Note ? ?PCP-Cardiologist: Candee Furbish, MD  ? ?Subjective:   ? ?Awake on vent. FiO2 remains at 50%  ? ?On abx for RML PNA. Received lasix yesterday for volume overload. Weight down 5 pounds.  ? ?Renal function stable. No CXR today. Yesterday CXR with PNA and evidence of edema/effusions ? ?Objective:   ?Weight Range: ?102 kg ?Body mass index is 42.49 kg/m?.  ? ?Vital Signs:   ?Temp:  [99 ?F (37.2 ?C)-99.9 ?F (37.7 ?C)] 99 ?F (37.2 ?C) (04/09 1000) ?Pulse Rate:  [52-73] 63 (04/09 1000) ?Resp:  [12-29] 19 (04/09 1000) ?BP: (120-159)/(65-92) 136/67 (04/09 1000) ?SpO2:  [90 %-97 %] 93 % (04/09 1000) ?FiO2 (%):  [50 %] 50 % (04/09 0914) ?Weight:  [102 kg] 102 kg (04/09 0500) ?Last BM Date :  (PTA) ? ?Weight change: ?Filed Weights  ? 08/04/21 0500 08/05/21 0340 08/06/21 0500  ?Weight: 103.2 kg 104.2 kg 102 kg  ? ? ?Intake/Output:  ? ?Intake/Output Summary (Last 24 hours) at 08/06/2021 1030 ?Last data filed at 08/06/2021 1000 ?Gross per 24 hour  ?Intake 3177.22 ml  ?Output 3495 ml  ?Net -317.78 ml  ? ? ? ?Physical Exam  ?  ?General:  Awake on vent  No resp difficulty ?HEENT: normal + ETT ?Neck: supple. JVP to jaw  Carotids 2+ bilat; no bruits. No lymphadenopathy or thryomegaly appreciated. ?Cor: PMI nondisplaced. Regular rate & rhythm. No rubs, gallops or murmurs. ?Lungs: clear ?Abdomen: soft, nontender, nondistended. No hepatosplenomegaly. No bruits or masses. Good bowel sounds. ?Extremities: no cyanosis, clubbing, rash, edema ?Neuro: awake on vent. Follows commands ? ?Telemetry  ? ?Sinus 60s Personally reviewed ? ? ?Labs  ?  ?CBC ?Recent Labs  ?  08/03/21 ?1035 08/03/21 ?1109 08/04/21 ?0145 08/04/21 ?0149 08/04/21 ?1144 08/06/21 ?0037  ?WBC 13.9*  --  13.4*  --   --  12.5*  ?NEUTROABS 11.2*  --   --   --   --  10.1*  ?HGB 12.6   < > 11.7*   < > 11.9* 11.8*  ?HCT 41.3   < > 36.8   < > 35.0* 37.4  ?MCV 96.3  --  94.1  --   --  92.6  ?PLT 428*  --  333  --   --  355  ? < > = values in this  interval not displayed.  ? ? ?Basic Metabolic Panel ?Recent Labs  ?  08/05/21 ?0345 08/05/21 ?9767 08/05/21 ?1704 08/06/21 ?0037  ?NA  --  139  --  138  ?K  --  4.0  --  3.7  ?CL  --  111  --  104  ?CO2  --  24  --  28  ?GLUCOSE  --  239*  --  226*  ?BUN  --  16  --  13  ?CREATININE  --  0.54  --  0.58  ?CALCIUM  --  8.0*  --  8.5*  ?MG 2.3  --  1.8 1.7  ?PHOS 3.1  --  3.5  --   ? ? ?Liver Function Tests ?Recent Labs  ?  08/03/21 ?1159 08/04/21 ?0145  ?AST 239* 108*  ?ALT 60* 49*  ?ALKPHOS 106 92  ?BILITOT 0.8 0.7  ?PROT 6.8 5.6*  ?ALBUMIN 3.7 3.0*  ? ? ?No results for input(s): LIPASE, AMYLASE in the last 72 hours. ?Cardiac Enzymes ?No results for input(s): CKTOTAL, CKMB, CKMBINDEX, TROPONINI in the last 72 hours. ? ?BNP: ?BNP (last 3 results) ?Recent Labs  ?  08/03/21 ?1035  ?BNP 304.1*  ? ? ? ?ProBNP (last 3 results) ?No results for input(s): PROBNP in the last 8760 hours. ? ? ?D-Dimer ?No results for input(s): DDIMER in the last 72 hours. ?Hemoglobin A1C ?No results for input(s): HGBA1C in the last 72 hours. ?Fasting Lipid Panel ?No results for input(s): CHOL, HDL, LDLCALC, TRIG, CHOLHDL, LDLDIRECT in the last 72 hours. ?Thyroid Function Tests ?No results for input(s): TSH, T4TOTAL, T3FREE, THYROIDAB in the last 72 hours. ? ?Invalid input(s): FREET3 ? ?Other results: ? ? ?Imaging  ? ? ?No results found. ? ? ?Medications:   ? ? ?Scheduled Medications: ? chlorhexidine gluconate (MEDLINE KIT)  15 mL Mouth Rinse BID  ? Chlorhexidine Gluconate Cloth  6 each Topical Daily  ? Chlorhexidine Gluconate Cloth  6 each Topical Daily  ? docusate  100 mg Per Tube BID  ? feeding supplement (PROSource TF)  45 mL Per Tube BID  ? gabapentin  300 mg Per Tube Q12H  ? heparin  5,000 Units Subcutaneous Q8H  ? insulin aspart  2-6 Units Subcutaneous Q4H  ? mouth rinse  15 mL Mouth Rinse 10 times per day  ? pantoprazole sodium  40 mg Per Tube Daily  ? polyethylene glycol  17 g Per Tube Daily  ? QUEtiapine  25 mg Per Tube QHS  ? sodium  chloride flush  10-40 mL Intracatheter Q12H  ? sodium chloride flush  3 mL Intravenous Q12H  ? ? ?Infusions: ? sodium chloride Stopped (08/05/21 0013)  ? sodium chloride    ? sodium chloride    ? ceFEPime (MAXIPIME) IV Stopped (08/06/21 0950)  ? dexmedetomidine (PRECEDEX) IV infusion 0.6 mcg/kg/hr (08/06/21 1000)  ? feeding supplement (VITAL 1.5 CAL) 1,000 mL (08/04/21 2354)  ? fentaNYL infusion INTRAVENOUS 125 mcg/hr (08/06/21 1000)  ? vancomycin Stopped (08/05/21 2149)  ? ? ?PRN Medications: ?Place/Maintain arterial line **AND** sodium chloride, Place/Maintain arterial line **AND** sodium chloride, etomidate, fentaNYL, LORazepam, sodium chloride flush ? ? ? ?Patient Profile  ? ?81 y/o woman with DM2, UC, obesity, HFPEF, CAD with remote LAD stents, fibromyalgia admitted with altered mental status and respiratory arrest. Found to have lactic acidosis and AKI. WBC 14K. Echo EF 45-50% with dilated and hypokinetic RV. CT no PE. +Evidence of significant PH. Developed junctional bradycardia in setting of hyperkalemia w/ k > 7. Admitted to CCU.  ? ?Assessment/Plan  ? ?1. Shock , unclear etiology. ? Septic +/- cardiogenic ?- Hypotensive on arrival. Placed on Norepi and given IV fluids.  ?- CTA negative for PE.  ?- Echo  45-50% RV dilated , septal flattening. Concern for RV failure/pulmonary HTN. Suspect preload dependent.  ?- Suspect due to PNA in setting of underlying PAH/cor pulmonale  ?- Now off NE.  ?- Will need R/L heart cath once extubated ?  ?2. Acute Hypoxic Respiratory Failure with RML PNA ?- remains intubated, FiO2 50% ?- vent wean per PCCM  ?- Continue abx ?- Continue to diurese. Will repeat lasix 40 IV ?- D/w Dr. Lynetta Mare at bedside ? ?3. Pulmonary HTN -> cor pulmonale ?- Marked RV strain on echo  ?  ?4 Bradycardia  ?- Due to hyperkalemia  ?- Rate in the 30s on admit -->given atropine with improved rate. Now SB 40s  ?  ?5. Hyperkalemia  ?- K 7.9 on admit. Given calcium gluconate + Lokelma  ?- Resolved ?  ?6.  H/O CAD ?- BMS prox LAD with 3 subsequent cath showing patent stent ?- HS Trop 131>136 ?- No s/s  angina ?- Plan [facility]  when extubated ?  ?7. DMII ?Per CCM ? ?CRITICAL CARE ?Performed by: Glori Bickers ? ?Total critical care time: 35 minutes ? ?Critical care time was exclusive of separately billable procedures and treating other patients. ? ?Critical care was necessary to treat or prevent imminent or life-threatening deterioration. ? ?Critical care was time spent personally by me (independent of midlevel providers or residents) on the following activities: development of treatment plan with patient and/or surrogate as well as nursing, discussions with consultants, evaluation of patient's response to treatment, examination of patient, obtaining history from patient or surrogate, ordering and performing treatments and interventions, ordering and review of laboratory studies, ordering and review of radiographic studies, pulse oximetry and re-evaluation of patient's condition. ? ? ?Length of Stay: 3 ? ?Glori Bickers, MD  ?08/06/2021, 10:30 AM ? ?Advanced Heart Failure Team ?Pager [phone] (M-F; 7a - 5p)  ?Please contact [facility] for night-coverage after hours (5p -7a ) and weekends on [email or web address] ? ? ? ? ? ? ?

## 2021-08-06 NOTE — Progress Notes (Signed)
RT Note: Pt tried on CPAP with nasal mask on 4 cm H2O. Pt did not tolerate, I told her if she would like to try again later RT would check back. Pt on 8L [place] at this time tolerating well ?

## 2021-08-06 NOTE — Progress Notes (Signed)
[facility] ADULT ICU REPLACEMENT PROTOCOL ? ? ?The patient does apply for the [facility] Adult ICU Electrolyte Replacment Protocol based on the criteria listed below:  ? ?1.Exclusion criteria: TCTS patients, ECMO patients, and Dialysis patients ?2. Is GFR >/= 30 ml/min? Yes.    ?Patient's GFR today is >60 ?3. Is SCr </= 2? Yes.   ?Patient's SCr is 0.58 mg/dL ?4. Did SCr increase >/= 0.5 in 24 hours? No. ?5.Pt's weight >40kg  Yes.   ?6. Abnormal electrolyte(s): Mag 1.7  ?7. Electrolytes replaced per protocol ?8.  Call MD STAT for K+ </= 2.5, Phos </= 1, or Mag </= 1 ?Physician:  Dr Genevive Bi ? ?Carlisle Beers 08/06/2021 3:29 AM  ?

## 2021-08-06 NOTE — Procedures (Signed)
Extubation Procedure Note ? ?Patient Details:   ?Name: [name] ?DOB: 1941-03-17 ?MRN: [id number] ?  ?Airway Documentation:  ?  ?Vent end date: 08/06/21 Vent end time: 1315  ? ?Evaluation ? O2 sats: stable throughout ?Complications: No apparent complications ?Patient did tolerate procedure well. ?Bilateral Breath Sounds: Clear, Diminished ?  ?Yes ? ?Esperanza Sheets T ?08/06/2021, 1:27 PM ? ?

## 2021-08-06 NOTE — Progress Notes (Signed)
? ?NAME:  [name], MRN:  [id number], DOB:  1941-02-08, LOS: 3 ?ADMISSION DATE:  08/03/2021 CONSULTATION DATE:  08/03/2021 ?REFERRING MD:  Maryan Rued - EDP CHIEF COMPLAINT:  AMS, hypoxia, shock  ? ?History of Present Illness:  ?81 year old woman who presented to [facility] ED 4/6 via EMS.  LKN 2100 4/5. Found unresponsive by family, breathing spontaneously, hypoxic with SpO2 40%. .  ? ?PMHx significant for HTN, HLD, CHF (Echo 03/2021 with EF 65-70%, grade 1 DD), CAD (s/p LAD stent 2002), asthma, T2DM, diverticulosis, renal lesion (stable, RCC vs. angiomyolipoma), GERD, fibromyalgia. ? ? ? Patient's daughter had been with her all day yesterday (4/5) shopping and seemingly normal, patient's only complaint was nausea which caused her to vomit x 1.  She reportedly felt better after vomiting and did not have any further episodes.  No complaints of upper respiratory symptoms, fever/chills, CP/SOB, changes in bowel habits or known sick contacts ? ?Required intubation and initiation of NE.  ? ?Pertinent Medical History:  ? ?Past Medical History:  ?Diagnosis Date  ? Adenomatous colon polyp 02/2006  ? Allergy   ? Allergy, unspecified not elsewhere classified   ? Anemia   ? Anxiety   ? Arthritis   ? Asthma   ? Blood transfusion without reported diagnosis   ? Bronchiectasis   ? CAD (coronary artery disease)   ? BMS to the LAD, 2002; cath 12/07/11 patent LAD stent and mild nonobstructive disease, EF 65%; Medical management  ? Cataract   ? removed both eyes  ? CHF (congestive heart failure) ([place])   ? Diastolic  ? Clotting disorder ([place])   ? in legs per pt   ? Diabetes mellitus   ? Diverticulosis of colon   ? DJD (degenerative joint disease)   ? Fibromyalgia   ? GERD (gastroesophageal reflux disease)   ? Hypercholesterolemia   ? Hypertension   ? Microscopic hematuria   ? Neoplasm of kidney   ? Obesity   ? Other diseases of lung, not elsewhere classified   ? Pinched vertebral nerve   ? Pulmonary nodule   ? Negative PET in 2010  ? Stress  incontinence, female   ? Syncope   ? Ulcerative colitis, left sided [facility]) 2008  ? Hx of  ? UTI (lower urinary tract infection)   ? Vitamin D deficiency disease   ? ?Significant Hospital Events: ?Including procedures, antibiotic start and stop dates in addition to other pertinent events   ?4/5 2100 LKW ?4/6 Unresponsive at home, hypoxic to 40%. BIB EMS to ED, febrile/hypotensive, bradycardic/hypoxic. Intubated, RIJ CVC placed. Levo and Vaso started. Limited Echo with EF 37.9%, new HF.  ?4/6 CT chest shows no pulmonary embolism. Obese patient. Bibasilar and RML infiltrates. RV not enlarged but RA is on CT  ?4/6 CT head is negative, Repeat abdominal CT for cyst evaluation recommended in 3 months. ?4/7 increased sedation for agitation.  ?4/9 extubated.  ? ?Interim History / Subjective:  ? ?Has done well with SBT.  ? ?Objective:  ?Blood pressure 129/66, pulse 61, temperature 99.1 ?F (37.3 ?C), resp. rate 19, height _0  (1.549 m), weight 102 kg, SpO2 91 %. ?CVP:  [3 mmHg-49 mmHg] 8 mmHg  ?Vent Mode: PSV;CPAP ?FiO2 (%):  [50 %] 50 % ?Set Rate:  [26 bmp] 26 bmp ?Vt Set:  [380 mL] 380 mL ?PEEP:  [5 cmH20-10 cmH20] 5 cmH20 ?Pressure Support:  [5 cmH20] 5 cmH20 ?Plateau Pressure:  [18 cmH20-22 cmH20] 22 cmH20  ? ?Intake/Output Summary (Last 24 hours) at  08/06/2021 1307 ?Last data filed at 08/06/2021 1300 ?Gross per 24 hour  ?Intake 3039.69 ml  ?Output 2725 ml  ?Net 314.69 ml  ? ? ?Filed Weights  ? 08/04/21 0500 08/05/21 0340 08/06/21 0500  ?Weight: 103.2 kg 104.2 kg 102 kg  ? ? ?Physical Examination: ?General: Acutely ill-appearing elderly woman in NAD. Grossly edematous. ?HEENT:. ETT/OGT in place. ?Neuro: Sedated but follows commands and attempts to communicate verbally.  ?CV: HS normal, extremities warm.  ?PULM: Breathing even and unlabored on vent. Crackles at right base. On  ?GI: Soft, nontender, mildly distended. Hypoactive bowel sounds. ?Extremities: Bilateral asymmetric LE edema noted (1-2+, L > R baseline per  daughter). ?Skin: Warm/dry, no rashes. ? ?Ancillary testing personally reviewed:  ? ?Assessment & Plan:  ? ?Principal Problem: ?  Cardiogenic shock ([place]) ?Active Problems: ?  Acute respiratory failure with hypoxia ([place]) ?  Community acquired pneumonia of right lung ?  Cor pulmonale, acute ([place]) ?  Acute HFrEF (heart failure with reduced ejection fraction) ([place]) ?  OSA (obstructive sleep apnea) ?  Cervical radicular pain ?  Diabetes mellitus with coincident hypertension ([place]) ?  ?Plan:  ? ?- Given that shock resolved with mechanical ventilation, suspect significant component of acute cor pulmonale causing cardiogenic shock. ?- Now off all vasoactives ?- Proceed to extubation.  ?- CPAP qhs, will need outpatient sleep study.  ?- Clinically nearly euvolemic. Diurese today again ?- For cardiac cath tomorrow ?- Complete empiric treatment for CAP  ?- Continue gabapentin for left am pain.  ? ? ?Best Practice: (right click and "Reselect all SmartList Selections" daily)  ? ?Diet/type: on tube feeds. Transition to regular post extubation.  ?DVT prophylaxis: prophylactic heparin  ?GI prophylaxis: PPI ?Lines: Central line - RIJ CVC ?Foley:  Yes, and it is still needed ?Code Status:  full code ?Last date of multidisciplinary goals of care discussion [Family updated at bedside 4/8 by Drs Lynetta Mare and BenSimhon] ? ?CRITICAL CARE ?Performed by: Kipp Brood ? ? ?Total critical care time: 40 minutes ? ?Critical care time was exclusive of separately billable procedures and treating other patients. ? ?Critical care was necessary to treat or prevent imminent or life-threatening deterioration. ? ?Critical care was time spent personally by me on the following activities: development of treatment plan with patient and/or surrogate as well as nursing, discussions with consultants, evaluation of patient's response to treatment, examination of patient, obtaining history from patient or surrogate, ordering and performing treatments and  interventions, ordering and review of laboratory studies, ordering and review of radiographic studies, pulse oximetry, re-evaluation of patient's condition and participation in multidisciplinary rounds. ? ?Kipp Brood, MD FRCPC ?ICU Physician ?[place]  ?Pager: [phone] ?Mobile: [phone] ?After hours: [phone]. ? ?

## 2021-08-07 ENCOUNTER — Encounter (HOSPITAL_COMMUNITY): Payer: Self-pay | Admitting: Internal Medicine

## 2021-08-07 ENCOUNTER — Inpatient Hospital Stay (HOSPITAL_COMMUNITY): Payer: PPO

## 2021-08-07 ENCOUNTER — Encounter (HOSPITAL_COMMUNITY)
Admission: EM | Disposition: A | Discharge status: Home Health | Payer: Self-pay | Source: Home / Self Care | Attending: Pulmonary Disease

## 2021-08-07 DIAGNOSIS — J9602 Acute respiratory failure with hypercapnia: Secondary | ICD-10-CM | POA: Diagnosis not present

## 2021-08-07 DIAGNOSIS — E662 Morbid (severe) obesity with alveolar hypoventilation: Secondary | ICD-10-CM

## 2021-08-07 DIAGNOSIS — R570 Cardiogenic shock: Secondary | ICD-10-CM | POA: Diagnosis not present

## 2021-08-07 DIAGNOSIS — I2721 Secondary pulmonary arterial hypertension: Secondary | ICD-10-CM | POA: Diagnosis not present

## 2021-08-07 DIAGNOSIS — I2510 Atherosclerotic heart disease of native coronary artery without angina pectoris: Secondary | ICD-10-CM | POA: Diagnosis not present

## 2021-08-07 DIAGNOSIS — Z79899 Other long term (current) drug therapy: Secondary | ICD-10-CM | POA: Diagnosis not present

## 2021-08-07 DIAGNOSIS — J9601 Acute respiratory failure with hypoxia: Secondary | ICD-10-CM | POA: Diagnosis not present

## 2021-08-07 DIAGNOSIS — G4733 Obstructive sleep apnea (adult) (pediatric): Secondary | ICD-10-CM | POA: Diagnosis not present

## 2021-08-07 DIAGNOSIS — Z79891 Long term (current) use of opiate analgesic: Secondary | ICD-10-CM

## 2021-08-07 HISTORY — PX: RIGHT/LEFT HEART CATH AND CORONARY ANGIOGRAPHY: CATH118266

## 2021-08-07 LAB — POCT I-STAT EG7
Acid-Base Excess: 5 mmol/L — ABNORMAL HIGH (ref 0.0–2.0)
Acid-Base Excess: 6 mmol/L — ABNORMAL HIGH (ref 0.0–2.0)
Bicarbonate: 31 mmol/L — ABNORMAL HIGH (ref 20.0–28.0)
Bicarbonate: 31.6 mmol/L — ABNORMAL HIGH (ref 20.0–28.0)
Calcium, Ion: 1.22 mmol/L (ref 1.15–1.40)
Calcium, Ion: 1.24 mmol/L (ref 1.15–1.40)
HCT: 40 % (ref 36.0–46.0)
HCT: 40 % (ref 36.0–46.0)
Hemoglobin: 13.6 g/dL (ref 12.0–15.0)
Hemoglobin: 13.6 g/dL (ref 12.0–15.0)
O2 Saturation: 66 %
O2 Saturation: 67 %
Potassium: 3.9 mmol/L (ref 3.5–5.1)
Potassium: 4 mmol/L (ref 3.5–5.1)
Sodium: 139 mmol/L (ref 135–145)
Sodium: 141 mmol/L (ref 135–145)
TCO2: 32 mmol/L (ref 22–32)
TCO2: 33 mmol/L — ABNORMAL HIGH (ref 22–32)
pCO2, Ven: 48.9 mmHg (ref 44–60)
pCO2, Ven: 49.7 mmHg (ref 44–60)
pH, Ven: 7.41 (ref 7.25–7.43)
pH, Ven: 7.411 (ref 7.25–7.43)
pO2, Ven: 34 mmHg (ref 32–45)
pO2, Ven: 35 mmHg (ref 32–45)

## 2021-08-07 LAB — POCT I-STAT 7, (LYTES, BLD GAS, ICA,H+H)
Acid-Base Excess: 3 mmol/L — ABNORMAL HIGH (ref 0.0–2.0)
Bicarbonate: 27.5 mmol/L (ref 20.0–28.0)
Calcium, Ion: 1.07 mmol/L — ABNORMAL LOW (ref 1.15–1.40)
HCT: 37 % (ref 36.0–46.0)
Hemoglobin: 12.6 g/dL (ref 12.0–15.0)
O2 Saturation: 92 %
Potassium: 3.7 mmol/L (ref 3.5–5.1)
Sodium: 142 mmol/L (ref 135–145)
TCO2: 29 mmol/L (ref 22–32)
pCO2 arterial: 41.3 mmHg (ref 32–48)
pH, Arterial: 7.432 (ref 7.35–7.45)
pO2, Arterial: 62 mmHg — ABNORMAL LOW (ref 83–108)

## 2021-08-07 LAB — CBC WITH DIFFERENTIAL/PLATELET
Abs Immature Granulocytes: 0.05 10*3/uL (ref 0.00–0.07)
Basophils Absolute: 0 10*3/uL (ref 0.0–0.1)
Basophils Relative: 0 %
Eosinophils Absolute: 0.5 10*3/uL (ref 0.0–0.5)
Eosinophils Relative: 4 %
HCT: 39.5 % (ref 36.0–46.0)
Hemoglobin: 12.5 g/dL (ref 12.0–15.0)
Immature Granulocytes: 0 %
Lymphocytes Relative: 10 %
Lymphs Abs: 1.3 10*3/uL (ref 0.7–4.0)
MCH: 29.3 pg (ref 26.0–34.0)
MCHC: 31.6 g/dL (ref 30.0–36.0)
MCV: 92.5 fL (ref 80.0–100.0)
Monocytes Absolute: 1.1 10*3/uL — ABNORMAL HIGH (ref 0.1–1.0)
Monocytes Relative: 8 %
Neutro Abs: 10.4 10*3/uL — ABNORMAL HIGH (ref 1.7–7.7)
Neutrophils Relative %: 78 %
Platelets: 404 10*3/uL — ABNORMAL HIGH (ref 150–400)
RBC: 4.27 MIL/uL (ref 3.87–5.11)
RDW: 14.1 % (ref 11.5–15.5)
WBC: 13.4 10*3/uL — ABNORMAL HIGH (ref 4.0–10.5)
nRBC: 0 % (ref 0.0–0.2)

## 2021-08-07 LAB — BASIC METABOLIC PANEL
Anion gap: 9 (ref 5–15)
BUN: 8 mg/dL (ref 8–23)
CO2: 27 mmol/L (ref 22–32)
Calcium: 9.1 mg/dL (ref 8.9–10.3)
Chloride: 103 mmol/L (ref 98–111)
Creatinine, Ser: 0.5 mg/dL (ref 0.44–1.00)
GFR, Estimated: >60 mL/min (ref >60)
Glucose, Bld: 147 mg/dL — ABNORMAL HIGH (ref 70–99)
Potassium: 3.9 mmol/L (ref 3.5–5.1)
Sodium: 139 mmol/L (ref 135–145)

## 2021-08-07 LAB — GLUCOSE, CAPILLARY
Glucose-Capillary: 141 mg/dL — ABNORMAL HIGH (ref 70–99)
Glucose-Capillary: 139 mg/dL — ABNORMAL HIGH (ref 70–99)
Glucose-Capillary: 140 mg/dL — ABNORMAL HIGH (ref 70–99)
Glucose-Capillary: 142 mg/dL — ABNORMAL HIGH (ref 70–99)
Glucose-Capillary: 130 mg/dL — ABNORMAL HIGH (ref 70–99)
Glucose-Capillary: 124 mg/dL — ABNORMAL HIGH (ref 70–99)

## 2021-08-07 LAB — COOXEMETRY PANEL
Carboxyhemoglobin: 1.9 % — ABNORMAL HIGH (ref 0.5–1.5)
Methemoglobin: <0.7 % (ref 0.0–1.5)
O2 Saturation: 69.8 %
Total hemoglobin: 13 g/dL (ref 12.0–16.0)

## 2021-08-07 LAB — MAGNESIUM: Magnesium: 1.9 mg/dL (ref 1.7–2.4)

## 2021-08-07 SURGERY — RIGHT/LEFT HEART CATH AND CORONARY ANGIOGRAPHY
Anesthesia: LOCAL

## 2021-08-07 MED ORDER — POTASSIUM CHLORIDE 20 MEQ PO PACK
40.0000 meq | PACK | Freq: Once | ORAL | Status: AC
  Administered 2021-08-07: 40 meq via ORAL
  Filled 2021-08-07: qty 2

## 2021-08-07 MED ORDER — FENTANYL CITRATE (PF) 100 MCG/2ML IJ SOLN
INTRAMUSCULAR | Status: DC | PRN
  Administered 2021-08-07: 25 ug via INTRAVENOUS

## 2021-08-07 MED ORDER — PANTOPRAZOLE SODIUM 40 MG PO TBEC
40.0000 mg | DELAYED_RELEASE_TABLET | Freq: Every day | ORAL | Status: DC
  Administered 2021-08-07: 40 mg via ORAL
  Filled 2021-08-07: qty 1

## 2021-08-07 MED ORDER — LABETALOL HCL 5 MG/ML IV SOLN
10.0000 mg | INTRAVENOUS | Status: AC | PRN
  Administered 2021-08-07 (×2): 10 mg via INTRAVENOUS
  Filled 2021-08-07 (×2): qty 4

## 2021-08-07 MED ORDER — SODIUM CHLORIDE 0.9 % IV SOLN: 250.0000 mL | INTRAVENOUS | Status: DC | PRN

## 2021-08-07 MED ORDER — ATENOLOL 25 MG PO TABS
50.0000 mg | ORAL_TABLET | Freq: Two times a day (BID) | ORAL | Status: DC
  Administered 2021-08-07 – 2021-08-11 (×8): 50 mg via ORAL
  Filled 2021-08-07 (×5): qty 2
  Filled 2021-08-07: qty 1
  Filled 2021-08-07 (×3): qty 2

## 2021-08-07 MED ORDER — VERAPAMIL HCL 2.5 MG/ML IV SOLN
INTRAVENOUS | Status: AC
  Filled 2021-08-07: qty 2

## 2021-08-07 MED ORDER — HYDRALAZINE HCL 20 MG/ML IJ SOLN
INTRAMUSCULAR | Status: AC
  Filled 2021-08-07: qty 1

## 2021-08-07 MED ORDER — HYDRALAZINE HCL 20 MG/ML IJ SOLN: 10.0000 mg | INTRAMUSCULAR | Status: AC | PRN

## 2021-08-07 MED ORDER — MAGNESIUM SULFATE IN D5W 1-5 GM/100ML-% IV SOLN
1.0000 g | Freq: Once | INTRAVENOUS | Status: AC
  Administered 2021-08-07: 1 g via INTRAVENOUS
  Filled 2021-08-07: qty 100

## 2021-08-07 MED ORDER — SODIUM CHLORIDE 0.9% FLUSH
3.0000 mL | Freq: Two times a day (BID) | INTRAVENOUS | Status: DC
  Administered 2021-08-07 – 2021-08-11 (×9): 3 mL via INTRAVENOUS

## 2021-08-07 MED ORDER — VERAPAMIL HCL 2.5 MG/ML IV SOLN
INTRAVENOUS | Status: DC | PRN
  Administered 2021-08-07: 10 mL via INTRA_ARTERIAL

## 2021-08-07 MED ORDER — HEPARIN SODIUM (PORCINE) 1000 UNIT/ML IJ SOLN
INTRAMUSCULAR | Status: DC | PRN
  Administered 2021-08-07: 5000 [IU] via INTRAVENOUS

## 2021-08-07 MED ORDER — HYDROCODONE-ACETAMINOPHEN 5-325 MG PO TABS
1.0000 | ORAL_TABLET | Freq: Four times a day (QID) | ORAL | Status: DC | PRN
  Administered 2021-08-07: 1 via ORAL
  Filled 2021-08-07: qty 1

## 2021-08-07 MED ORDER — MIDAZOLAM HCL 2 MG/2ML IJ SOLN
INTRAMUSCULAR | Status: DC | PRN
  Administered 2021-08-07: 1 mg via INTRAVENOUS

## 2021-08-07 MED ORDER — LIDOCAINE HCL (PF) 1 % IJ SOLN
INTRAMUSCULAR | Status: DC | PRN
Start: 2021-08-07 — End: 2021-08-07
  Administered 2021-08-07 (×2): 2 mL via SUBCUTANEOUS

## 2021-08-07 MED ORDER — HYDRALAZINE HCL 20 MG/ML IJ SOLN
INTRAMUSCULAR | Status: DC | PRN
  Administered 2021-08-07: 5 mg via INTRAVENOUS

## 2021-08-07 MED ORDER — SODIUM CHLORIDE 0.9 % IV SOLN: INTRAVENOUS | Status: AC

## 2021-08-07 MED ORDER — SODIUM CHLORIDE 0.9% FLUSH: 3.0000 mL | INTRAVENOUS | Status: DC | PRN

## 2021-08-07 MED ORDER — HEPARIN (PORCINE) IN NACL 1000-0.9 UT/500ML-% IV SOLN
INTRAVENOUS | Status: DC | PRN
  Administered 2021-08-07 (×2): 500 mL

## 2021-08-07 MED ORDER — SODIUM CHLORIDE 0.9 % IV SOLN
2.0000 g | Freq: Three times a day (TID) | INTRAVENOUS | Status: DC
  Administered 2021-08-07 – 2021-08-08 (×2): 2 g via INTRAVENOUS
  Filled 2021-08-07 (×2): qty 12.5

## 2021-08-07 MED ORDER — MIDAZOLAM HCL 2 MG/2ML IJ SOLN
INTRAMUSCULAR | Status: AC
  Filled 2021-08-07: qty 2

## 2021-08-07 MED ORDER — INSULIN ASPART 100 UNIT/ML IJ SOLN
0.0000 [IU] | INTRAMUSCULAR | Status: DC
  Administered 2021-08-07 – 2021-08-08 (×5): 2 [IU] via SUBCUTANEOUS

## 2021-08-07 MED ORDER — LIDOCAINE HCL (PF) 1 % IJ SOLN
INTRAMUSCULAR | Status: AC
Start: 2021-08-07 — End: ?
  Filled 2021-08-07: qty 30

## 2021-08-07 MED ORDER — ONDANSETRON HCL 4 MG/2ML IJ SOLN: 4.0000 mg | Freq: Four times a day (QID) | INTRAMUSCULAR | Status: DC | PRN

## 2021-08-07 MED ORDER — FENTANYL CITRATE (PF) 100 MCG/2ML IJ SOLN
INTRAMUSCULAR | Status: AC
  Filled 2021-08-07: qty 2

## 2021-08-07 MED ORDER — POTASSIUM CHLORIDE 20 MEQ PO PACK: 40.0000 meq | PACK | Freq: Once | ORAL | Status: DC

## 2021-08-07 MED ORDER — IOHEXOL 350 MG/ML SOLN
INTRAVENOUS | Status: DC | PRN
  Administered 2021-08-07: 80 mL

## 2021-08-07 MED ORDER — HEPARIN (PORCINE) IN NACL 1000-0.9 UT/500ML-% IV SOLN
INTRAVENOUS | Status: AC
  Filled 2021-08-07: qty 1000

## 2021-08-07 MED ORDER — AMLODIPINE BESYLATE 10 MG PO TABS
10.0000 mg | ORAL_TABLET | Freq: Every day | ORAL | Status: DC
  Administered 2021-08-07 – 2021-08-11 (×5): 10 mg via ORAL
  Filled 2021-08-07 (×5): qty 1

## 2021-08-07 MED ORDER — HEPARIN SODIUM (PORCINE) 1000 UNIT/ML IJ SOLN
INTRAMUSCULAR | Status: AC
  Filled 2021-08-07: qty 10

## 2021-08-07 MED ORDER — FUROSEMIDE 10 MG/ML IJ SOLN
40.0000 mg | Freq: Every day | INTRAMUSCULAR | Status: DC
  Administered 2021-08-07 – 2021-08-08 (×2): 40 mg via INTRAVENOUS
  Filled 2021-08-07 (×2): qty 4

## 2021-08-07 MED ORDER — PANTOPRAZOLE 2 MG/ML SUSPENSION: 40.0000 mg | Freq: Every day | ORAL | Status: DC

## 2021-08-07 MED ORDER — LIDOCAINE 5 % EX PTCH
1.0000 | MEDICATED_PATCH | CUTANEOUS | Status: DC
  Administered 2021-08-07: 2 via TRANSDERMAL
  Administered 2021-08-09 – 2021-08-10 (×2): 1 via TRANSDERMAL
  Filled 2021-08-07: qty 2
  Filled 2021-08-07 (×3): qty 1

## 2021-08-07 MED ORDER — ENOXAPARIN SODIUM 40 MG/0.4ML IJ SOSY
40.0000 mg | PREFILLED_SYRINGE | INTRAMUSCULAR | Status: DC
  Administered 2021-08-08 – 2021-08-09 (×2): 40 mg via SUBCUTANEOUS
  Filled 2021-08-07 (×2): qty 0.4

## 2021-08-07 MED ORDER — ACETAMINOPHEN 325 MG PO TABS: 650.0000 mg | ORAL_TABLET | ORAL | Status: DC | PRN

## 2021-08-07 SURGICAL SUPPLY — 13 items
BAND ZEPHYR COMPRESS 30 LONG (HEMOSTASIS) ×1 IMPLANT
CATH 5FR JL3.5 JR4 ANG PIG MP (CATHETERS) ×1 IMPLANT
CATH BALLN WEDGE 5F 110CM (CATHETERS) ×1 IMPLANT
CATH INFINITI 5 FR 3DRC (CATHETERS) ×1 IMPLANT
GLIDESHEATH SLEND SS 6F .021 (SHEATH) ×1 IMPLANT
GUIDEWIRE .025 260CM (WIRE) ×1 IMPLANT
GUIDEWIRE INQWIRE 1.5J.035X260 (WIRE) IMPLANT
INQWIRE 1.5J .035X260CM (WIRE) ×2
KIT HEART LEFT (KITS) ×1 IMPLANT
MAT PREVALON FULL STRYKER (MISCELLANEOUS) ×1 IMPLANT
PACK CARDIAC CATHETERIZATION (CUSTOM PROCEDURE TRAY) ×2 IMPLANT
SHEATH GLIDE SLENDER 4/5FR (SHEATH) ×1 IMPLANT
TRANSDUCER W/STOPCOCK (MISCELLANEOUS) ×2 IMPLANT

## 2021-08-07 NOTE — Progress Notes (Signed)
? ? Advanced Heart Failure Rounding Note ? ?PCP-Cardiologist: Candee Furbish, MD  ? ?Subjective:   ? ?Extubated yesterday.  Sitting in chair. Denies CP or SOB.  ? ?Objective:   ?Weight Range: ?100.1 kg ?Body mass index is 41.7 kg/m?.  ? ?Vital Signs:   ?Temp:  [98.8 ?F (37.1 ?C)-100 ?F (37.8 ?C)] 99.1 ?F (37.3 ?C) (04/10 0745) ?Pulse Rate:  [59-86] 75 (04/10 0700) ?Resp:  [14-23] 21 (04/10 0700) ?BP: (109-166)/(66-94) 163/87 (04/10 0700) ?SpO2:  [90 %-98 %] 93 % (04/10 0700) ?FiO2 (%):  [50 %] 50 % (04/09 1115) ?Weight:  [100.1 kg-100.8 kg] 100.1 kg (04/10 0500) ?Last BM Date :  (PTA) ? ?Weight change: ?Filed Weights  ? 08/06/21 0500 08/06/21 2200 08/07/21 0500  ?Weight: 102 kg 100.8 kg 100.1 kg  ? ? ?Intake/Output:  ? ?Intake/Output Summary (Last 24 hours) at 08/07/2021 0903 ?Last data filed at 08/07/2021 0700 ?Gross per 24 hour  ?Intake 873.84 ml  ?Output 2516 ml  ?Net -1642.16 ml  ? ? ? ?Physical Exam  ?  ?General:  Well appearing. No resp difficulty ?HEENT: normal ?Neck: supple. no JVD. Carotids 2+ bilat; no bruits. No lymphadenopathy or thryomegaly appreciated. ?Cor: PMI nondisplaced. Regular rate & rhythm. No rubs, gallops or murmurs. ?Lungs: clear ?Abdomen: soft, nontender, nondistended. No hepatosplenomegaly. No bruits or masses. Good bowel sounds. ?Extremities: no cyanosis, clubbing, rash, edema ?Neuro: alert & orientedx3, cranial nerves grossly intact. moves all 4 extremities w/o difficulty. Affect pleasant ? ?Telemetry  ? ?Sinus 70-80s Personally reviewed ? ? ?Labs  ?  ?CBC ?Recent Labs  ?  08/06/21 ?1324 08/07/21 ?4010  ?WBC 12.5* 13.4*  ?NEUTROABS 10.1* 10.4*  ?HGB 11.8* 12.5  ?HCT 37.4 39.5  ?MCV 92.6 92.5  ?PLT 355 404*  ? ? ?Basic Metabolic Panel ?Recent Labs  ?  08/05/21 ?0345 08/05/21 ?2725 08/05/21 ?1704 08/06/21 ?3664 08/07/21 ?4034  ?NA  --    < >  --  138 139  ?K  --    < >  --  3.7 3.9  ?CL  --    < >  --  104 103  ?CO2  --    < >  --  28 27  ?GLUCOSE  --    < >  --  226* 147*  ?BUN  --    < >   --  13 8  ?CREATININE  --    < >  --  0.58 0.50  ?CALCIUM  --    < >  --  8.5* 9.1  ?MG 2.3  --  1.8 1.7 1.9  ?PHOS 3.1  --  3.5  --   --   ? < > = values in this interval not displayed.  ? ? ?Liver Function Tests ?No results for input(s): AST, ALT, ALKPHOS, BILITOT, PROT, ALBUMIN in the last 72 hours. ? ?No results for input(s): LIPASE, AMYLASE in the last 72 hours. ?Cardiac Enzymes ?No results for input(s): CKTOTAL, CKMB, CKMBINDEX, TROPONINI in the last 72 hours. ? ?BNP: ?BNP (last 3 results) ?Recent Labs  ?  08/03/21 ?1035  ?BNP 304.1*  ? ? ? ?ProBNP (last 3 results) ?No results for input(s): PROBNP in the last 8760 hours. ? ? ?D-Dimer ?No results for input(s): DDIMER in the last 72 hours. ?Hemoglobin A1C ?No results for input(s): HGBA1C in the last 72 hours. ?Fasting Lipid Panel ?No results for input(s): CHOL, HDL, LDLCALC, TRIG, CHOLHDL, LDLDIRECT in the last 72 hours. ?Thyroid Function Tests ?No results for input(s):  TSH, T4TOTAL, T3FREE, THYROIDAB in the last 72 hours. ? ?Invalid input(s): FREET3 ? ?Other results: ? ? ?Imaging  ? ? ?DG CHEST PORT 1 VIEW ? ?Result Date: 08/07/2021 ?CLINICAL DATA:  81 year old female with hypoxia. EXAM: PORTABLE CHEST 1 VIEW COMPARISON:  08/05/2021 portable chest and earlier. FINDINGS: Portable AP semi upright view at 0511 hours. Extubated, enteric tube removed and right IJ central line removed. Stable lung volumes. Stable cardiomegaly and mediastinal contours. Improved ventilation bilaterally with mild residual lung base veiling opacity now. No pulmonary edema or consolidation. No acute osseous abnormality identified. IMPRESSION: 1. Extubated, enteric tube, and right IJ central line removed. 2. Improved bilateral lung volumes and ventilation. Mild residual lung base veiling opacity probably reflects continued small pleural effusions. Electronically Signed   By: Genevie Ann [name]   On: 08/07/2021 05:58   ? ? ?Medications:   ? ? ?Scheduled Medications: ? Chlorhexidine Gluconate Cloth   6 each Topical Daily  ? gabapentin  300 mg Oral Q12H  ? heparin  5,000 Units Subcutaneous Q8H  ? mouth rinse  15 mL Mouth Rinse BID  ? pantoprazole sodium  40 mg Per Tube Daily  ? sodium chloride flush  10-40 mL Intracatheter Q12H  ? sodium chloride flush  3 mL Intravenous Q12H  ? ? ?Infusions: ? sodium chloride Stopped (08/05/21 0013)  ? sodium chloride    ? sodium chloride    ? sodium chloride 10 mL/hr at 08/07/21 0813  ? ceFEPime (MAXIPIME) IV Stopped (08/07/21 0001)  ? feeding supplement (VITAL 1.5 CAL) 1,000 mL (08/04/21 2354)  ? vancomycin Stopped (08/06/21 2043)  ? ? ?PRN Medications: ?[CANCELED] Place/Maintain arterial line **AND** sodium chloride, sodium chloride, etomidate, LORazepam, phenol, sodium chloride flush, sodium chloride flush ? ? ? ?Patient Profile  ? ?81 y/o woman with DM2, UC, obesity, HFPEF, CAD with remote LAD stents, fibromyalgia admitted with altered mental status and respiratory arrest. Found to have lactic acidosis and AKI. WBC 14K. Echo EF 45-50% with dilated and hypokinetic RV. CT no PE. +Evidence of significant PH. Developed junctional bradycardia in setting of hyperkalemia w/ k > 7. Admitted to CCU.  ? ?Assessment/Plan  ? ?1. Shock , unclear etiology. ? Septic +/- cardiogenic ?- Hypotensive on arrival. Placed on Norepi and given IV fluids.  ?- CTA negative for PE.  ?- Echo  45-50% RV dilated , septal flattening. Concern for RV failure/pulmonary HTN. Suspect preload dependent.  ?- Suspect due to PNA in setting of underlying PAH/cor pulmonale  ?- Off pressors. Hemodynamically stable. BP elevated. Can likely begin to resume home meds after cath ?  ?2. Acute Hypoxic Respiratory Failure with RML PNA ?- Extubated 4/9 ?- Continue abx ?- CCM managing ? ?3. Pulmonary HTN -> cor pulmonale ?- Marked RV strain on echo  ?- Suspect OSA/OHS ?- For R/L cath today ?  ?4 Bradycardia  ?- Due to hyperkalemia  ?- Resolved ?  ?5. Hyperkalemia  ?- K 7.9 on admit. Given calcium gluconate + Lokelma  ?-  Resolved ?  ?6. H/O CAD ?- BMS prox LAD with 3 subsequent caths showing patent stent ?- HS Trop 131>136 ?- No s.s angina ?- Plan R/LHC  today ?  ?7. DMII ?Per CCM ? ? ? ?Length of Stay: 4 ? ?Glori Bickers, MD  ?08/07/2021, 9:03 AM ? ?Advanced Heart Failure Team ?Pager [phone] (M-F; 7a - 5p)  ?Please contact [facility] for night-coverage after hours (5p -7a ) and weekends on [email or web address] ? ? ? ? ? ? ?

## 2021-08-07 NOTE — Interval H&P Note (Signed)
History and Physical Interval Note: ? ?08/07/2021 ?9:29 AM ? ?[name]  has presented today for surgery, with the diagnosis of hf.  The various methods of treatment have been discussed with the patient and family. After consideration of risks, benefits and other options for treatment, the patient has consented to  Procedure(s): ?RIGHT/LEFT HEART CATH AND CORONARY ANGIOGRAPHY (N/A) and possible coronary angioplasty as a surgical intervention.  [name] history has been reviewed, patient examined, no change in status, stable for surgery.  I have reviewed the patient's chart and labs.  Questions were answered to the patient's satisfaction.   ? ? ?[name] ? ? ?

## 2021-08-07 NOTE — Progress Notes (Signed)
PHARMACY NOTE:  ANTIMICROBIAL RENAL DOSAGE ADJUSTMENT ? ?Current antimicrobial regimen includes a mismatch between antimicrobial dosage and estimated renal function.  As per policy approved by the Pharmacy & Therapeutics and Medical Executive Committees, the antimicrobial dosage will be adjusted accordingly. ? ?Current antimicrobial dosage:  cefepime 2g q12h ? ?Indication: PNA ? ?Renal Function: CrCl 61.8 ml/min ? ?Estimated Creatinine Clearance: 61.8 mL/min (by C-G formula based on SCr of 0.5 mg/dL). ?_0      On intermittent HD, scheduled: ?_1      On CRRT ?   ?Antimicrobial dosage has been changed to:  cefepime 2g q8h ? ?Additional comments: ? ? ?Thank you for allowing pharmacy to be a part of this patient's care. ? ?Aleene Davidson, [place] ?08/07/2021 2:59 PM ?

## 2021-08-07 NOTE — Progress Notes (Signed)
? ?NAME:  [name], MRN:  [id number], DOB:  May 03, 1940, LOS: 4 ?ADMISSION DATE:  08/03/2021 CONSULTATION DATE:  08/03/2021 ?REFERRING MD:  Maryan Rued - EDP CHIEF COMPLAINT:  AMS, hypoxia, shock  ? ?History of Present Illness:  ?81 year old woman who presented to [facility] ED 4/6 via EMS.  LKN 2100 4/5. Found unresponsive by family, breathing spontaneously, hypoxic with SpO2 40%. .  ? ?PMHx significant for HTN, HLD, CHF (Echo 03/2021 with EF 65-70%, grade 1 DD), CAD (s/p LAD stent 2002), asthma, T2DM, diverticulosis, renal lesion (stable, RCC vs. angiomyolipoma), GERD, fibromyalgia. ? ? ? Patient's daughter had been with her all day yesterday (4/5) shopping and seemingly normal, patient's only complaint was nausea which caused her to vomit x 1.  She reportedly felt better after vomiting and did not have any further episodes.  No complaints of upper respiratory symptoms, fever/chills, CP/SOB, changes in bowel habits or known sick contacts ? ?Required intubation and initiation of NE.  ? ?Pertinent Medical History:  ? ?Past Medical History:  ?Diagnosis Date  ? Adenomatous colon polyp 02/2006  ? Allergy   ? Allergy, unspecified not elsewhere classified   ? Anemia   ? Anxiety   ? Arthritis   ? Asthma   ? Blood transfusion without reported diagnosis   ? Bronchiectasis   ? CAD (coronary artery disease)   ? BMS to the LAD, 2002; cath 12/07/11 patent LAD stent and mild nonobstructive disease, EF 65%; Medical management  ? Cataract   ? removed both eyes  ? CHF (congestive heart failure) ([place])   ? Diastolic  ? Chronic diastolic CHF (congestive heart failure) ([place]) 11/25/2011  ? Clotting disorder ([place])   ? in legs per pt   ? Diabetes mellitus   ? Diverticulosis of colon   ? DJD (degenerative joint disease)   ? Fibromyalgia   ? GERD (gastroesophageal reflux disease)   ? H. pylori infection 05/12/2019  ? Hypercholesterolemia   ? Hypertension   ? Microscopic hematuria   ? Neoplasm of kidney   ? Obesity   ? Other diseases of lung, not elsewhere  classified   ? Pinched vertebral nerve   ? Pulmonary nodule   ? Negative PET in 2010  ? Stress incontinence, female   ? Syncope   ? Ulcerative colitis, left sided [facility]) 2008  ? Hx of  ? UTI (lower urinary tract infection)   ? Vitamin D deficiency disease   ? ?Significant Hospital Events: ?Including procedures, antibiotic start and stop dates in addition to other pertinent events   ?4/5 2100 LKW ?4/6 Unresponsive at home, hypoxic to 40%. BIB EMS to ED, febrile/hypotensive, bradycardic/hypoxic. Intubated, RIJ CVC placed. Levo and Vaso started. Limited Echo with EF 37.9%, new HF.  ?4/6 CT chest shows no pulmonary embolism. Obese patient. Bibasilar and RML infiltrates. RV not enlarged but RA is on CT  ?4/6 CT head is negative, Repeat abdominal CT for cyst evaluation recommended in 3 months. ?4/7 increased sedation for agitation.  ?4/9 extubated.  ?4/10 R/LHC  ? ?Interim History / Subjective:  ? ?NAEO ? ?Going for heart cath this morning ? ?Small volumes on IS. 8L HFNC  ? ?Objective:  ?Blood pressure (!) 163/87, pulse 75, temperature 99.1 ?F (37.3 ?C), temperature source Oral, resp. rate (!) 21, height _0  (1.549 m), weight 100.1 kg, SpO2 93 %. ?CVP:  [3 mmHg-9 mmHg] 3 mmHg  ?Vent Mode: PSV;CPAP ?FiO2 (%):  [50 %] 50 % ?PEEP:  [5 cmH20] 5 cmH20 ?Pressure Support:  [6  cmH20] 5 cmH20  ? ?Intake/Output Summary (Last 24 hours) at 08/07/2021 0756 ?Last data filed at 08/07/2021 0700 ?Gross per 24 hour  ?Intake 1039.7 ml  ?Output 2616 ml  ?Net -1576.3 ml  ? ?Filed Weights  ? 08/06/21 0500 08/06/21 2200 08/07/21 0500  ?Weight: 102 kg 100.8 kg 100.1 kg  ? ? ?Physical Examination: ?General: Obese older adult F seated in recliner  ?HEENT: NCAT pink mm [place] in place  ?Neuro: Awake alert oriented x3 following commands  ?CV: s1s2 no rgm cap refill < 3 sec  ?PULM: Diminished bibasilar sounds, no adventitious sounds. Even unlabored. Shallow  ?GI: Obese soft normoactive x4 ?Extremities: BLE edema. No cyanosis or clubbing  ?Skin: c/d/w no  rash  ? ?Assessment & Plan:  ?Resolved problems: ?Cardiogenic shock  ?Hyperkalemia ?Bradycardia due to hyperkalemia ? ? ?Acute respiratory failure with hypoxia ?RML CAP ?Atelectasis ?Pleural effusion ?OSA ?P ?-Wean O2 support as able ?-IS, PT/ mobility ?-up to chair  ?-qHS CPAP  ?-cont cefepime for tx CAP (5d course with stop date ordered). Dc vanc  ?-Lasix 5m  ? ?Acute HFrEF  ?pHTN  with cor pulmonale ?Hx CAD  ?P ?-R/LHC 4/10  ?-may be candidate for transfer out of ICU later 4/10  ?-likely dc foley 4/10 after cath  ? ?DM ?P ?-SSI ? ? ?Best Practice: (right click and "Reselect all SmartList Selections" daily)  ? ?Diet/type: Advance as tolerated to Heart healthy  ?DVT prophylaxis: prophylactic heparin  ?GI prophylaxis: PPI ?Lines: n/a ?Foley:  Yes, and it is still needed  ?Code Status:  full code ?Last date of multidisciplinary goals of care discussion [Daughter updated at bedside 4/10. RN present]  ? ? ?CCT: n/a  ? ? ?GEliseo GumMSN, AGACNP-BC ?[facility] ?Amion for pager  ?08/07/2021, 10:31 AM ? ?

## 2021-08-07 NOTE — Progress Notes (Signed)
Pt refused CPAP for bed tonight due to nausea. RT will cont to monitor as needed.  ?

## 2021-08-07 NOTE — H&P (View-Only) (Signed)
? ? Advanced Heart Failure Rounding Note ? ?PCP-Cardiologist: Mark Skains, MD  ? ?Subjective:   ? ?Extubated yesterday.  Sitting in chair. Denies CP or SOB.  ? ?Objective:   ?Weight Range: ?100.1 kg ?Body mass index is 41.7 kg/m?.  ? ?Vital Signs:   ?Temp:  [98.8 ?F (37.1 ?C)-100 ?F (37.8 ?C)] 99.1 ?F (37.3 ?C) (04/10 0745) ?Pulse Rate:  [59-86] 75 (04/10 0700) ?Resp:  [14-23] 21 (04/10 0700) ?BP: (109-166)/(66-94) 163/87 (04/10 0700) ?SpO2:  [90 %-98 %] 93 % (04/10 0700) ?FiO2 (%):  [50 %] 50 % (04/09 1115) ?Weight:  [100.1 kg-100.8 kg] 100.1 kg (04/10 0500) ?Last BM Date :  (PTA) ? ?Weight change: ?Filed Weights  ? 08/06/21 0500 08/06/21 2200 08/07/21 0500  ?Weight: 102 kg 100.8 kg 100.1 kg  ? ? ?Intake/Output:  ? ?Intake/Output Summary (Last 24 hours) at 08/07/2021 0903 ?Last data filed at 08/07/2021 0700 ?Gross per 24 hour  ?Intake 873.84 ml  ?Output 2516 ml  ?Net -1642.16 ml  ? ? ? ?Physical Exam  ?  ?General:  Well appearing. No resp difficulty ?HEENT: normal ?Neck: supple. no JVD. Carotids 2+ bilat; no bruits. No lymphadenopathy or thryomegaly appreciated. ?Cor: PMI nondisplaced. Regular rate & rhythm. No rubs, gallops or murmurs. ?Lungs: clear ?Abdomen: soft, nontender, nondistended. No hepatosplenomegaly. No bruits or masses. Good bowel sounds. ?Extremities: no cyanosis, clubbing, rash, edema ?Neuro: alert & orientedx3, cranial nerves grossly intact. moves all 4 extremities w/o difficulty. Affect pleasant ? ?Telemetry  ? ?Sinus 70-80s Personally reviewed ? ? ?Labs  ?  ?CBC ?Recent Labs  ?  08/06/21 ?0037 08/07/21 ?0624  ?WBC 12.5* 13.4*  ?NEUTROABS 10.1* 10.4*  ?HGB 11.8* 12.5  ?HCT 37.4 39.5  ?MCV 92.6 92.5  ?PLT 355 404*  ? ? ?Basic Metabolic Panel ?Recent Labs  ?  08/05/21 ?0345 08/05/21 ?0938 08/05/21 ?1704 08/06/21 ?0037 08/07/21 ?0624  ?NA  --    < >  --  138 139  ?K  --    < >  --  3.7 3.9  ?CL  --    < >  --  104 103  ?CO2  --    < >  --  28 27  ?GLUCOSE  --    < >  --  226* 147*  ?BUN  --    < >   --  13 8  ?CREATININE  --    < >  --  0.58 0.50  ?CALCIUM  --    < >  --  8.5* 9.1  ?MG 2.3  --  1.8 1.7 1.9  ?PHOS 3.1  --  3.5  --   --   ? < > = values in this interval not displayed.  ? ? ?Liver Function Tests ?No results for input(s): AST, ALT, ALKPHOS, BILITOT, PROT, ALBUMIN in the last 72 hours. ? ?No results for input(s): LIPASE, AMYLASE in the last 72 hours. ?Cardiac Enzymes ?No results for input(s): CKTOTAL, CKMB, CKMBINDEX, TROPONINI in the last 72 hours. ? ?BNP: ?BNP (last 3 results) ?Recent Labs  ?  08/03/21 ?1035  ?BNP 304.1*  ? ? ? ?ProBNP (last 3 results) ?No results for input(s): PROBNP in the last 8760 hours. ? ? ?D-Dimer ?No results for input(s): DDIMER in the last 72 hours. ?Hemoglobin A1C ?No results for input(s): HGBA1C in the last 72 hours. ?Fasting Lipid Panel ?No results for input(s): CHOL, HDL, LDLCALC, TRIG, CHOLHDL, LDLDIRECT in the last 72 hours. ?Thyroid Function Tests ?No results for input(s):   TSH, T4TOTAL, T3FREE, THYROIDAB in the last 72 hours. ? ?Invalid input(s): FREET3 ? ?Other results: ? ? ?Imaging  ? ? ?DG CHEST PORT 1 VIEW ? ?Result Date: 08/07/2021 ?CLINICAL DATA:  81 year old female with hypoxia. EXAM: PORTABLE CHEST 1 VIEW COMPARISON:  08/05/2021 portable chest and earlier. FINDINGS: Portable AP semi upright view at 0511 hours. Extubated, enteric tube removed and right IJ central line removed. Stable lung volumes. Stable cardiomegaly and mediastinal contours. Improved ventilation bilaterally with mild residual lung base veiling opacity now. No pulmonary edema or consolidation. No acute osseous abnormality identified. IMPRESSION: 1. Extubated, enteric tube, and right IJ central line removed. 2. Improved bilateral lung volumes and ventilation. Mild residual lung base veiling opacity probably reflects continued small pleural effusions. Electronically Signed   By: Genevie Ann [name]   On: 08/07/2021 05:58   ? ? ?Medications:   ? ? ?Scheduled Medications: ? Chlorhexidine Gluconate Cloth   6 each Topical Daily  ? gabapentin  300 mg Oral Q12H  ? heparin  5,000 Units Subcutaneous Q8H  ? mouth rinse  15 mL Mouth Rinse BID  ? pantoprazole sodium  40 mg Per Tube Daily  ? sodium chloride flush  10-40 mL Intracatheter Q12H  ? sodium chloride flush  3 mL Intravenous Q12H  ? ? ?Infusions: ? sodium chloride Stopped (08/05/21 0013)  ? sodium chloride    ? sodium chloride    ? sodium chloride 10 mL/hr at 08/07/21 0813  ? ceFEPime (MAXIPIME) IV Stopped (08/07/21 0001)  ? feeding supplement (VITAL 1.5 CAL) 1,000 mL (08/04/21 2354)  ? vancomycin Stopped (08/06/21 2043)  ? ? ?PRN Medications: ?[CANCELED] Place/Maintain arterial line **AND** sodium chloride, sodium chloride, etomidate, LORazepam, phenol, sodium chloride flush, sodium chloride flush ? ? ? ?Patient Profile  ? ?81 y/o woman with DM2, UC, obesity, HFPEF, CAD with remote LAD stents, fibromyalgia admitted with altered mental status and respiratory arrest. Found to have lactic acidosis and AKI. WBC 14K. Echo EF 45-50% with dilated and hypokinetic RV. CT no PE. +Evidence of significant PH. Developed junctional bradycardia in setting of hyperkalemia w/ k > 7. Admitted to CCU.  ? ?Assessment/Plan  ? ?1. Shock , unclear etiology. ? Septic +/- cardiogenic ?- Hypotensive on arrival. Placed on Norepi and given IV fluids.  ?- CTA negative for PE.  ?- Echo  45-50% RV dilated , septal flattening. Concern for RV failure/pulmonary HTN. Suspect preload dependent.  ?- Suspect due to PNA in setting of underlying PAH/cor pulmonale  ?- Off pressors. Hemodynamically stable. BP elevated. Can likely begin to resume home meds after cath ?  ?2. Acute Hypoxic Respiratory Failure with RML PNA ?- Extubated 4/9 ?- Continue abx ?- CCM managing ? ?3. Pulmonary HTN -> cor pulmonale ?- Marked RV strain on echo  ?- Suspect OSA/OHS ?- For R/L cath today ?  ?4 Bradycardia  ?- Due to hyperkalemia  ?- Resolved ?  ?5. Hyperkalemia  ?- K 7.9 on admit. Given calcium gluconate + Lokelma  ?-  Resolved ?  ?6. H/O CAD ?- BMS prox LAD with 3 subsequent caths showing patent stent ?- HS Trop 131>136 ?- No s.s angina ?- Plan R/LHC  today ?  ?7. DMII ?Per CCM ? ? ? ?Length of Stay: 4 ? ?Glori Bickers, MD  ?08/07/2021, 9:03 AM ? ?Advanced Heart Failure Team ?Pager [phone] (M-F; 7a - 5p)  ?Please contact [facility] for night-coverage after hours (5p -7a ) and weekends on [email or web address] ? ? ? ? ? ? ?

## 2021-08-08 DIAGNOSIS — I5021 Acute systolic (congestive) heart failure: Secondary | ICD-10-CM | POA: Diagnosis not present

## 2021-08-08 DIAGNOSIS — J9601 Acute respiratory failure with hypoxia: Secondary | ICD-10-CM | POA: Diagnosis not present

## 2021-08-08 DIAGNOSIS — R570 Cardiogenic shock: Secondary | ICD-10-CM | POA: Diagnosis not present

## 2021-08-08 DIAGNOSIS — I2609 Other pulmonary embolism with acute cor pulmonale: Secondary | ICD-10-CM | POA: Diagnosis not present

## 2021-08-08 LAB — CBC WITH DIFFERENTIAL/PLATELET
Abs Immature Granulocytes: 0.03 10*3/uL (ref 0.00–0.07)
Basophils Absolute: 0 10*3/uL (ref 0.0–0.1)
Basophils Relative: 0 %
Eosinophils Absolute: 0.5 10*3/uL (ref 0.0–0.5)
Eosinophils Relative: 5 %
HCT: 36.8 % (ref 36.0–46.0)
Hemoglobin: 11.7 g/dL — ABNORMAL LOW (ref 12.0–15.0)
Immature Granulocytes: 0 %
Lymphocytes Relative: 11 %
Lymphs Abs: 1.2 10*3/uL (ref 0.7–4.0)
MCH: 30.1 pg (ref 26.0–34.0)
MCHC: 31.8 g/dL (ref 30.0–36.0)
MCV: 94.6 fL (ref 80.0–100.0)
Monocytes Absolute: 0.9 10*3/uL (ref 0.1–1.0)
Monocytes Relative: 9 %
Neutro Abs: 7.5 10*3/uL (ref 1.7–7.7)
Neutrophils Relative %: 75 %
Platelets: UNDETERMINED 10*3/uL (ref 150–400)
RBC: 3.89 MIL/uL (ref 3.87–5.11)
RDW: 14.3 % (ref 11.5–15.5)
WBC: 10.1 10*3/uL (ref 4.0–10.5)
nRBC: 0 % (ref 0.0–0.2)

## 2021-08-08 LAB — CULTURE, BLOOD (ROUTINE X 2)
Culture: NO GROWTH
Culture: NO GROWTH
Special Requests: ADEQUATE

## 2021-08-08 LAB — CBC
HCT: 39.8 % (ref 36.0–46.0)
Hemoglobin: 12.8 g/dL (ref 12.0–15.0)
MCH: 29.5 pg (ref 26.0–34.0)
MCHC: 32.2 g/dL (ref 30.0–36.0)
MCV: 91.7 fL (ref 80.0–100.0)
Platelets: 443 10*3/uL — ABNORMAL HIGH (ref 150–400)
RBC: 4.34 MIL/uL (ref 3.87–5.11)
RDW: 14.1 % (ref 11.5–15.5)
WBC: 12.5 10*3/uL — ABNORMAL HIGH (ref 4.0–10.5)
nRBC: 0 % (ref 0.0–0.2)

## 2021-08-08 LAB — BASIC METABOLIC PANEL
Anion gap: 10 (ref 5–15)
Anion gap: 8 (ref 5–15)
BUN: 10 mg/dL (ref 8–23)
BUN: 10 mg/dL (ref 8–23)
CO2: 21 mmol/L — ABNORMAL LOW (ref 22–32)
CO2: 28 mmol/L (ref 22–32)
Calcium: 8.7 mg/dL — ABNORMAL LOW (ref 8.9–10.3)
Calcium: 8.9 mg/dL (ref 8.9–10.3)
Chloride: 105 mmol/L (ref 98–111)
Chloride: 101 mmol/L (ref 98–111)
Creatinine, Ser: 0.41 mg/dL — ABNORMAL LOW (ref 0.44–1.00)
Creatinine, Ser: 0.41 mg/dL — ABNORMAL LOW (ref 0.44–1.00)
GFR, Estimated: >60 mL/min (ref >60)
GFR, Estimated: >60 mL/min (ref >60)
Glucose, Bld: 123 mg/dL — ABNORMAL HIGH (ref 70–99)
Glucose, Bld: 191 mg/dL — ABNORMAL HIGH (ref 70–99)
Potassium: 5.4 mmol/L — ABNORMAL HIGH (ref 3.5–5.1)
Potassium: 4 mmol/L (ref 3.5–5.1)
Sodium: 136 mmol/L (ref 135–145)
Sodium: 137 mmol/L (ref 135–145)

## 2021-08-08 LAB — GLUCOSE, CAPILLARY
Glucose-Capillary: 136 mg/dL — ABNORMAL HIGH (ref 70–99)
Glucose-Capillary: 141 mg/dL — ABNORMAL HIGH (ref 70–99)
Glucose-Capillary: 142 mg/dL — ABNORMAL HIGH (ref 70–99)
Glucose-Capillary: 143 mg/dL — ABNORMAL HIGH (ref 70–99)
Glucose-Capillary: 191 mg/dL — ABNORMAL HIGH (ref 70–99)

## 2021-08-08 LAB — CULTURE, BLOOD (SINGLE): Culture: NO GROWTH

## 2021-08-08 MED ORDER — OLOPATADINE HCL 0.1 % OP SOLN
1.0000 [drp] | Freq: Two times a day (BID) | OPHTHALMIC | Status: DC | PRN
Start: 2021-08-08 — End: 2021-08-11
  Filled 2021-08-08: qty 5

## 2021-08-08 MED ORDER — PANTOPRAZOLE SODIUM 40 MG PO TBEC
40.0000 mg | DELAYED_RELEASE_TABLET | Freq: Two times a day (BID) | ORAL | Status: DC
  Administered 2021-08-08 – 2021-08-11 (×7): 40 mg via ORAL
  Filled 2021-08-08 (×7): qty 1

## 2021-08-08 MED ORDER — PANCRELIPASE (LIP-PROT-AMYL) 20000-63000 UNITS PO CPEP
20000.0000 [IU] | ORAL_CAPSULE | Freq: Three times a day (TID) | ORAL | Status: DC
Start: 2021-08-08 — End: 2021-08-08

## 2021-08-08 MED ORDER — OLOPATADINE HCL 0.7 % OP SOLN: 1.0000 [drp] | Freq: Every day | OPHTHALMIC | Status: DC | PRN

## 2021-08-08 MED ORDER — MOMETASONE FURO-FORMOTEROL FUM 200-5 MCG/ACT IN AERO
2.0000 | INHALATION_SPRAY | Freq: Two times a day (BID) | RESPIRATORY_TRACT | Status: DC
  Administered 2021-08-09 – 2021-08-11 (×5): 2 via RESPIRATORY_TRACT
  Filled 2021-08-08 (×3): qty 8.8

## 2021-08-08 MED ORDER — FERROUS SULFATE 325 (65 FE) MG PO TABS
325.0000 mg | ORAL_TABLET | Freq: Two times a day (BID) | ORAL | Status: DC
  Administered 2021-08-08 – 2021-08-11 (×7): 325 mg via ORAL
  Filled 2021-08-08 (×7): qty 1

## 2021-08-08 MED ORDER — ATORVASTATIN CALCIUM 10 MG PO TABS
20.0000 mg | ORAL_TABLET | Freq: Every day | ORAL | Status: DC
  Administered 2021-08-08 – 2021-08-11 (×4): 20 mg via ORAL
  Filled 2021-08-08 (×4): qty 2

## 2021-08-08 MED ORDER — MESALAMINE 1.2 G PO TBEC
2.4000 g | DELAYED_RELEASE_TABLET | Freq: Two times a day (BID) | ORAL | Status: DC
  Administered 2021-08-08 – 2021-08-11 (×7): 2.4 g via ORAL
  Filled 2021-08-08 (×9): qty 2

## 2021-08-08 MED ORDER — PANCRELIPASE (LIP-PROT-AMYL) 12000-38000 UNITS PO CPEP: 12000.0000 [IU] | ORAL_CAPSULE | Freq: Two times a day (BID) | ORAL | Status: DC | PRN

## 2021-08-08 MED ORDER — TORSEMIDE 20 MG PO TABS
20.0000 mg | ORAL_TABLET | Freq: Two times a day (BID) | ORAL | Status: DC
Start: 2021-08-08 — End: 2021-08-11
  Administered 2021-08-08 – 2021-08-11 (×6): 20 mg via ORAL
  Filled 2021-08-08 (×6): qty 1

## 2021-08-08 MED ORDER — PANCRELIPASE (LIP-PROT-AMYL) 12000-38000 UNITS PO CPEP
12000.0000 [IU] | ORAL_CAPSULE | ORAL | Status: DC
  Filled 2021-08-08: qty 1

## 2021-08-08 MED ORDER — CLOPIDOGREL BISULFATE 75 MG PO TABS
75.0000 mg | ORAL_TABLET | Freq: Every day | ORAL | Status: DC
  Administered 2021-08-08 – 2021-08-11 (×4): 75 mg via ORAL
  Filled 2021-08-08 (×4): qty 1

## 2021-08-08 MED ORDER — PANCRELIPASE (LIP-PROT-AMYL) 12000-38000 UNITS PO CPEP
24000.0000 [IU] | ORAL_CAPSULE | Freq: Three times a day (TID) | ORAL | Status: DC
  Administered 2021-08-08 – 2021-08-11 (×10): 24000 [IU] via ORAL
  Filled 2021-08-08 (×10): qty 2

## 2021-08-08 MED ORDER — ALPRAZOLAM 0.25 MG PO TABS: 0.2500 mg | ORAL_TABLET | Freq: Two times a day (BID) | ORAL | Status: DC | PRN

## 2021-08-08 NOTE — Progress Notes (Addendum)
? ? Advanced Heart Failure Rounding Note ? ?PCP-Cardiologist: Candee Furbish, MD  ? ?Subjective:   ? ?R/LHC 4/10 w/ mild to moderate non-obstructive CAD with 90% lesion in apical LAD (medical therapy) + mod PAH and preserved output. PWP 15, RAP 8 >> continued on IV Lasix w/ 1.7 L in UOP  ? ?CXR yesterday + mild improved bilateral lung volumes and ventilation. Mild residual ?lung base veiling opacity probably reflects continued small pleural ?effusions. Abx discontinued  ? ? ?Moved out of ICU yesterday. Reports feeling better but c/w O2 requirements, currently on 6L/min Fate. C/w cough. No chest pain.  ? ?? Accuracy of morning labs. Hgb 13.6>>11.7. Platelets clumped. K 3.9>>5.4 ?STAT repeat CBC and BMP pending ? ?BPs moderately elevated. ? ?Sitting  up on side of bed. No other complaints. Family at bedside.  ? ?RHC hemodynamics ? ?Ao = 167/84 (118) ?LV = 152/11 ?RA =  8 ?RV = 64/11 ?PA = 68/26 (45) ?PCW = 15 ?Fick cardiac output/index = 6.0/3.0 ?PVR = 5.0 WU ?FA sat = 92% ?PA sat = 66%, 67% ? ? ?Objective:   ?Weight Range: ?99.8 kg ?Body mass index is 41.59 kg/m?.  ? ?Vital Signs:   ?Temp:  [98.6 ?F (37 ?C)-99.7 ?F (37.6 ?C)] 99.1 ?F (37.3 ?C) (04/11 0431) ?Pulse Rate:  [63-102] 63 (04/11 0840) ?Resp:  [14-25] 14 (04/11 0721) ?BP: (134-174)/(67-106) 139/78 (04/11 0840) ?SpO2:  [94 %-100 %] 97 % (04/11 0721) ?Weight:  [99.7 kg-99.8 kg] 99.8 kg (04/11 0431) ?Last BM Date :  (PTA) ? ?Weight change: ?Filed Weights  ? 08/07/21 0500 08/07/21 2001 08/08/21 0431  ?Weight: 100.1 kg 99.7 kg 99.8 kg  ? ? ?Intake/Output:  ? ?Intake/Output Summary (Last 24 hours) at 08/08/2021 1025 ?Last data filed at 08/07/2021 1900 ?Gross per 24 hour  ?Intake 540.05 ml  ?Output 2030 ml  ?Net -1489.95 ml  ? ? ?Physical Exam  ?  ?General:  Well appearing, moderately obese. No resp difficulty ?HEENT: normal ?Neck: supple. Thick neck, JVD not well visualized Carotids 2+ bilat; no bruits. No lymphadenopathy or thryomegaly appreciated. ?Cor: PMI  nondisplaced. Regular rate & rhythm. No rubs, gallops or murmurs. ?Lungs: + b/u upper expiratory wheezing b/l  ?Abdomen: soft, nontender, nondistended. No hepatosplenomegaly. No bruits or masses. Good bowel sounds. ?Extremities: no cyanosis, clubbing, rash, edema + SCDs  ?Neuro: alert & orientedx3, cranial nerves grossly intact. moves all 4 extremities w/o difficulty. Affect pleasant ? ?Telemetry  ? ?Sinus 70-80s Personally reviewed ? ? ?Labs  ?  ?CBC ?Recent Labs  ?  08/07/21 ?0624 08/07/21 ?1001 08/07/21 ?1005 08/08/21 ?0223  ?WBC 13.4*  --   --  10.1  ?NEUTROABS 10.4*  --   --  7.5  ?HGB 12.5   < > 13.6 11.7*  ?HCT 39.5   < > 40.0 36.8  ?MCV 92.5  --   --  94.6  ?PLT 404*  --   --  PLATELET CLUMPS NOTED ON SMEAR, UNABLE TO ESTIMATE  ? < > = values in this interval not displayed.  ? ?Basic Metabolic Panel ?Recent Labs  ?  08/05/21 ?1704 08/05/21 ?1704 08/06/21 ?0037 08/07/21 ?4010 08/07/21 ?1001 08/07/21 ?1005 08/08/21 ?0223  ?NA  --    < > 138 139   < > 141 136  ?K  --    < > 3.7 3.9   < > 3.9 5.4*  ?CL  --    < > 104 103  --   --  105  ?CO2  --    < >  28 27  --   --  21*  ?GLUCOSE  --    < > 226* 147*  --   --  123*  ?BUN  --    < > 13 8  --   --  10  ?CREATININE  --    < > 0.58 0.50  --   --  0.41*  ?CALCIUM  --    < > 8.5* 9.1  --   --  8.7*  ?MG 1.8  --  1.7 1.9  --   --   --   ?PHOS 3.5  --   --   --   --   --   --   ? < > = values in this interval not displayed.  ? ?Liver Function Tests ?No results for input(s): AST, ALT, ALKPHOS, BILITOT, PROT, ALBUMIN in the last 72 hours. ? ?No results for input(s): LIPASE, AMYLASE in the last 72 hours. ?Cardiac Enzymes ?No results for input(s): CKTOTAL, CKMB, CKMBINDEX, TROPONINI in the last 72 hours. ? ?BNP: ?BNP (last 3 results) ?Recent Labs  ?  08/03/21 ?1035  ?BNP 304.1*  ? ? ?ProBNP (last 3 results) ?No results for input(s): PROBNP in the last 8760 hours. ? ? ?D-Dimer ?No results for input(s): DDIMER in the last 72 hours. ?Hemoglobin A1C ?No results for input(s):  HGBA1C in the last 72 hours. ?Fasting Lipid Panel ?No results for input(s): CHOL, HDL, LDLCALC, TRIG, CHOLHDL, LDLDIRECT in the last 72 hours. ?Thyroid Function Tests ?No results for input(s): TSH, T4TOTAL, T3FREE, THYROIDAB in the last 72 hours. ? ?Invalid input(s): FREET3 ? ?Other results: ? ? ?Imaging  ? ? ?No results found. ? ? ?Medications:   ? ? ?Scheduled Medications: ? amLODipine  10 mg Oral Daily  ? atenolol  50 mg Oral BID  ? atorvastatin  20 mg Oral Daily  ? clopidogrel  75 mg Oral Daily  ? enoxaparin (LOVENOX) injection  40 mg Subcutaneous Q24H  ? ferrous sulfate  325 mg Oral BID  ? furosemide  40 mg Intravenous Daily  ? gabapentin  300 mg Oral Q12H  ? lidocaine  1-2 patch Transdermal Q24H  ? lipase/protease/amylase  12,000 Units Oral With snacks  ? lipase/protease/amylase  24,000 Units Oral TID AC  ? mouth rinse  15 mL Mouth Rinse BID  ? mesalamine  2.4 g Oral BID  ? mometasone-formoterol  2 puff Inhalation BID  ? pantoprazole  40 mg Oral BID  ? sodium chloride flush  3 mL Intravenous Q12H  ? ? ?Infusions: ? sodium chloride Stopped (08/05/21 0013)  ? sodium chloride    ? ? ?PRN Medications: ?sodium chloride, acetaminophen, ALPRAZolam, HYDROcodone-acetaminophen, olopatadine, ondansetron (ZOFRAN) IV, phenol, sodium chloride flush ? ? ? ?Patient Profile  ? ?81 y/o woman with DM2, UC, obesity, HFPEF, CAD with remote LAD stents, fibromyalgia admitted with altered mental status and respiratory arrest. Found to have lactic acidosis and AKI. WBC 14K. Echo EF 45-50% with dilated and hypokinetic RV. CT no PE. +Evidence of significant PH. Developed junctional bradycardia in setting of hyperkalemia w/ k > 7. Admitted to CCU.  ? ?Assessment/Plan  ? ?1. Shock , unclear etiology. ? Septic +/- cardiogenic ?- Hypotensive on arrival. Placed on Norepi and given IV fluids.  ?- CTA negative for PE.  ?- Echo  45-50% RV dilated , septal flattening. Concern for RV failure/pulmonary HTN. Suspect preload dependent.  ?- Suspect  due to PNA in setting of underlying PAH/cor pulmonale. Completed Abx ?- Resolved, hemodynamically stable  off pressors  ? ?2. Acute Hypoxic Respiratory Failure with RML PNA ?- Extubated 4/9 ?- Completed Abx  ?- CXR 4/10 w/ improved bilateral lung volumes and ventilation ?- Continues on high O2 requirements, currently on 6L, will try to wean today  ?- CCM has s/o  ? ?3. Pulmonary HTN -> cor pulmonale ?- Marked RV strain on echo  ?- RHC c/w Moderate PAH, Likely Group 3 PAH ?- will need eval for OSA/OHS  ?- R/L filling pressures not markedly high on cath, will switch to PO torsemide today  ?  ?4 Bradycardia  ?- Due to hyperkalemia  ?- Resolved ?  ?5. Hyperkalemia  ?- K 7.9 on admit. Given calcium gluconate + Lokelma  ?- K 5.2 on BMP this morning ? Hemolyzed. Repeat BMP pending  ?  ?6. H/O CAD ?- BMS prox LAD with 3 subsequent caths showing patent stent ?- HS Trop 131>136 ?- No s/s angina ?- LHC this admit w/ mild to moderate non-obstructive CAD with 90% lesion in apical LAD ?- medical management ?  ?7. DMII ?- per IM  ? ? ?Length of Stay: 5 ? ?Lyda Jester, PA-C  ?08/08/2021, 10:25 AM ? ?Advanced Heart Failure Team ?Pager [phone] (M-F; 7a - 5p)  ?Please contact [facility] for night-coverage after hours (5p -7a ) and weekends on [email or web address] ?Patient seen and examined with the above-signed Advanced Practice Provider and/or Housestaff. I personally reviewed laboratory data, imaging studies and relevant notes. I independently examined the patient and formulated the important aspects of the plan. I have edited the note to reflect any of my changes or salient points. I have personally discussed the plan with the patient and/or family. ? ?Still with O2 requirement and SOB. Cath results reviewed ? ?General:  Sitting up No resp difficulty ?HEENT: normal ?Neck: supple. no JVD. Carotids 2+ bilat; no bruits. No lymphadenopathy or thryomegaly appreciated. ?Cor: PMI nondisplaced. Regular rate & rhythm. No rubs, gallops or  murmurs. ?Lungs: clear ?Abdomen: obese soft, nontender, nondistended. No hepatosplenomegaly. No bruits or masses. Good bowel sounds. ?Extremities: no cyanosis, clubbing, rash, edema ?Neuro: alert & orientedx3, cran

## 2021-08-08 NOTE — Progress Notes (Signed)
Nutrition Follow-up ? ?DOCUMENTATION CODES:  ?Obesity unspecified ? ?INTERVENTION:  ?Liberalize diet to regular with no added salt packets ?Encourage PO intake ? ?NUTRITION DIAGNOSIS:  ?Inadequate oral intake related to inability to eat as evidenced by NPO status. ?- remains applicable,  ? ?GOAL:  ?Patient will meet greater than or equal to 90% of their needs ?- progressing, diet advanced ? ?MONITOR:  ?PO intake, Weight trends, Labs, I & O's ? ?REASON FOR ASSESSMENT:  ?Ventilator, Consult ?Enteral/tube feeding initiation and management ? ?ASSESSMENT:  ?81 year old female who presented to the ED on 4/06 with AMS. PMH of HTN, CHF, CAD, asthma, T2DM, GERD, ulcerative colitis, fibromyalgia. Pt required intubation in the ED and found to have mixed septic and cardiogenic shock, acute systolic heart failure, CAP, AKI. ? ?4/6 - Intubated ?4/9 - extubated ?4/10 - right/left heart cath and coronary angiography  ? ?Pt resting in bed at the time of visit, several family members at bedside answered nutrition questions for pt. Pt awake and alert but soft spoken.  ? ?Family reports that pt has a small appetite at baseline, but is currently eating less than normal. Typically eats two meals and snacks throughout the day at baseline.  ? ?States that pt does not like her options and is taking in minimal amounts from dining services. Will liberalize diet to encourage adequate intake. Will request no salt packets be added to tray. Family reports that pt does not add salt to foods at baseline. ? ?Pt is unsure of recent weight changes, states that she has not noticed any and is unsure if edema is at baseline. Pt declines nutrition supplements at this time. Does not like ensure-type drinks. Stressed the importance of adequate intake in healing and recovery. Pt expresses understanding. ? ?Net IO Since Admission: -939.48 mL [08/08/21 1248] ? ?Average Meal Intake: ?4/9-4/11: 70% intake x 2 recorded meals ? ?Nutritionally Relevant  Medications: ?Scheduled Meds: ? atorvastatin  20 mg Oral Daily  ? ferrous sulfate  325 mg Oral BID  ? furosemide  40 mg Intravenous Daily  ? lipase/protease/amylase  12,000 Units Oral With snacks  ? lipase/protease/amylase  24,000 Units Oral TID AC  ? mesalamine  2.4 g Oral BID  ? pantoprazole  40 mg Oral BID  ? ?PRN Meds: ondansetron, phenol ? ?Labs Reviewed: ?Potassium 5.4 ?Creatinine .41  ?CBG ranges from 124-142 mg/dL over the last 24 hours ?HgbA1c 7.8% (06/16/21) ? ?NUTRITION - FOCUSED PHYSICAL EXAM: ?Flowsheet Row Most Recent Value  ?Orbital Region No depletion  ?Upper Arm Region No depletion  ?Thoracic and Lumbar Region No depletion  ?Buccal Region Unable to assess  ?Temple Region No depletion  ?Clavicle Bone Region Mild depletion  ?Clavicle and Acromion Bone Region Mild depletion  ?Scapular Bone Region Mild depletion  ?Dorsal Hand No depletion  ?Patellar Region No depletion  ?Anterior Thigh Region Mild depletion  ?Posterior Calf Region No depletion  ?Edema (RD Assessment) Mild  [BLE]  ?Hair Reviewed  ?Eyes Reviewed  ?Mouth Reviewed  ?Skin Reviewed  ?Nails Reviewed  ? ?Diet Order:   ?Diet Order   ? ?       ?  Diet regular Room service appropriate? Yes with Assist; Fluid consistency: Thin  Diet effective now       ?  ? ?  ?  ? ?  ? ? ?EDUCATION NEEDS:  ?Not appropriate for education at this time ? ?Skin:  Skin Assessment: Reviewed RN Assessment ? ?Last BM:  4/11 - per I&O ? ?Height:  ?Ht Readings  from Last 1 Encounters:  ?08/07/21 _0  (1.549 m)  ? ? ?Weight:  ?Wt Readings from Last 1 Encounters:  ?08/08/21 99.8 kg  ? ? ?Ideal Body Weight:  47.7 kg ? ?BMI:  Body mass index is 41.59 kg/m?. ? ?Estimated Nutritional Needs:  ?Kcal:  1600-1800 kcal/d ?Protein:  85-100 g/d ?Fluid:  1.8-2 L/d ? ? ? ?Ranell Patrick, RD, LDN ?Clinical Dietitian ?RD pager # available in [place]  ?After hours/weekend pager # available in [place] ?

## 2021-08-08 NOTE — Care Management Important Message (Signed)
Important Message ? ?Patient Details  ?Name: [name] ?MRN: [id number] ?Date of Birth: [birth year] ? ? ?Medicare Important Message Given:  Yes ? ? ? ? ?Shelda Altes ?08/08/2021, 8:36 AM ?

## 2021-08-08 NOTE — Hospital Course (Signed)
PMHx significant for HTN, HLD, CHF (Echo 03/2021 with EF 65-70%, grade 1 DD), CAD (s/p LAD stent 2002), asthma, T2DM, diverticulosis, renal lesion (stable, RCC vs. angiomyolipoma), GERD, fibromyalgia.  who presented to [facility] ED 4/6 via EMS.  LKN 2100 4/5. Found unresponsive by family, breathing spontaneously, hypoxic with SpO2 40%. ? ?4/5 2100 LKW ?4/6 Unresponsive at home, hypoxic to 40%. BIB EMS to ED, febrile/hypotensive, bradycardic/hypoxic. Intubated, RIJ CVC placed. Levo and Vaso started. Limited Echo with low EF, new HF.  ?4/6 CT chest shows no pulmonary embolism. Obese patient. Bibasilar and RML infiltrates. RV not enlarged but RA is on CT  ?4/6 CT head is negative, Repeat abdominal CT for cyst evaluation recommended in 3 months. ?4/9 extubated.  ?4/10 underwent left and right heart cath, mild to moderate nonobstructive CAD-medical therapy recommended.  Group 3 PAH.  Normal LV function. ?

## 2021-08-08 NOTE — Progress Notes (Signed)
PT on 3LNC. Pt refusing CPAP; none in room. No resp distress noted. Will continue to monitor.  ?

## 2021-08-08 NOTE — Progress Notes (Signed)
Pt.has voided yet since 1900 ,bladder scan done  & obtained 199 .PCCM on call made aware & ordered to bladder scan again later this morning . ?

## 2021-08-08 NOTE — Plan of Care (Signed)
?  Problem: Coping: ?Goal: Level of anxiety will decrease ?Outcome: Progressing ?  ?

## 2021-08-08 NOTE — Progress Notes (Signed)
?Progress Note ?Patient: Aniylah [name] WGN:[id number] DOB: [birth year] DOA: 08/03/2021  ?DOS: the patient was seen and examined on 08/08/2021 ? ?Brief hospital course: ?PMHx significant for HTN, HLD, CHF (Echo 03/2021 with EF 65-70%, grade 1 DD), CAD (s/p LAD stent 2002), asthma, T2DM, diverticulosis, renal lesion (stable, RCC vs. angiomyolipoma), GERD, fibromyalgia.  who presented to [facility] ED 4/6 via EMS.  LKN 2100 4/5. Found unresponsive by family, breathing spontaneously, hypoxic with SpO2 40%. ? ?4/5 2100 LKW ?4/6 Unresponsive at home, hypoxic to 40%. BIB EMS to ED, febrile/hypotensive, bradycardic/hypoxic. Intubated, RIJ CVC placed. Levo and Vaso started. Limited Echo with low EF, new HF.  ?4/6 CT chest shows no pulmonary embolism. Obese patient. Bibasilar and RML infiltrates. RV not enlarged but RA is on CT  ?4/6 CT head is negative, Repeat abdominal CT for cyst evaluation recommended in 3 months. ?4/9 extubated.  ?4/10 underwent left and right heart cath, mild to moderate nonobstructive CAD-medical therapy recommended.  Group 3 PAH.  Normal LV function. ? ?Assessment and Plan: ?Shock likely combination of cardiogenic and septic shock. ?Acute hypoxic respiratory failure secondary to right-sided pneumonia. ?Pulmonary hypertension with cor pulmonale. ?Advanced heart failure service consulted. ?Appreciate assistance. ?Patient initially presented to the ICU.  Intubated on arrival due to unresponsive event. ?CTA negative for pulmonary embolism. ?Echocardiogram shows EF of 45 to 50% with dilated RV and septal flattening. ?Underwent right heart cath.  Shows evidence of pulmonary hypertension. ?Patient was on vasopressors currently off of them. ?Treated with IV antibiotics for 5 days. ?Cultures so far negative. ?Does not use oxygen at home.  Currently on 6 L of oxygen to maintain saturation above 92%. ?Per pulmonary recommend BiPAP nightly. ?Monitor on telemetry. ? ?Essential hypertension ?Sinus bradycardia. ?Home  medications currently resume. ?Monitor. ? ?Hyperkalemia. ?Treated. ?Echo responsible plan is bradycardia.  Monitor. ? ?CAD ?Underwent left heart catheter nonobstructive CAD.  Continue Plavix. ? ?Type 2 diabetes mellitus, uncontrolled with hyperglycemia ?Continue sliding scale insulin. ? ?Chronic left eye ptosis and left facial angle droopy. ?Family showed me pictures of her few months prior with similar findings. ?Patient has generalized weakness on bilateral upper and lower extremities. ?Continue PT OT therapy. ?CT head unremarkable. ?Patient and family would not like to pursue MRI as this appears to be chronic finding and I agree. ? ?Intractable nausea and vomiting. ?No diarrhea. ?Continue Zofran as needed. ?Continue on current diet. ? ?Morbid obesity, BMI more than 40 ?Placing the patient at [facility] outcome. ?Also at risk for sleep apnea.  Will require further sleep study outpatient. ?Will require BiPAP on discharge if possible. ? ?Anxiety ?On Xanax and gabapentin. ?Monitor. ? ?Ulcerative colitis ?Continue home regimen medication. ? ?Left kidney lesion ?Will require repeat CT scan with and without contrast in 3 months. ? ?Subjective: No nausea no vomiting.  Reports fatigue and tiredness.  No fever no chills. ? ?Physical Exam: ?Vitals:  ? 08/08/21 1159 08/08/21 1619 08/08/21 2023 08/08/21 2144  ?BP: (!) 150/85 (!) 156/82 (!) 150/80 (!) 150/80  ?Pulse: (!) 58 62 65 71  ?Resp: 19 (!) 21 17   ?Temp: 97.8 ?F (36.6 ?C) 98.2 ?F (36.8 ?C) 97.9 ?F (36.6 ?C)   ?TempSrc: Oral Oral Oral   ?SpO2: 97% 95% 95%   ?Weight:      ?Height:      ? ?General: Appear in mild distress; no visible Abnormal Neck Mass Or lumps, Conjunctiva normal ?Cardiovascular: S1 and S2 Present, no Murmur, ?Respiratory: good respiratory effort, Bilateral Air entry present and CTA, no Crackles, no  wheezes ?Abdomen: Bowel Sound present, Non tender ?Extremities: no Pedal edema ?Neurology: alert and oriented to time, place, and person, left eye and facial  droopiness ?Gait not checked due to patient safety concerns  ? ?Data Reviewed: ?I have Reviewed nursing notes, Vitals, and Lab results since pt's last encounter. Pertinent lab results CBC and BMP ?I have ordered test including CBC and BMP ?I have reviewed the last note from cardiology,   ? ?Family Communication: Daughter and niece at bedside ? ?Disposition: ?Status is: Inpatient ?Remains inpatient appropriate because: Severely hypoxic requiring 6 L of oxygen right now. ? ?Author: ?Berle Mull, MD ?08/08/2021 9:52 PM ? ?For on call review [email or web address]. ?

## 2021-08-08 NOTE — Evaluation (Signed)
Physical Therapy Evaluation ?Patient Details ?Name: [name] ?MRN: [id number] ?DOB: 1940-05-20 ?Today's Date: 08/08/2021 ? ?History of Present Illness ? Pt is 81 yo female admitted 08/03/21 with resp failure, shock, and encephalopathy.  Pt intubated 08/03/21-08/06/21.  Heart catherization 08/06/21.  Pt with hx including but not limited to HTN, HLD, CHF (Echo 03/2021 with EF 65-70%, grade 1 DD), CAD (s/p LAD stent 2002), asthma, T2DM, diverticulosis, renal lesion (stable, RCC vs. angiomyolipoma), GERD, fibromyalgia.  ?Clinical Impression ? Pt admitted with above diagnosis. At baseline, pt ambulates in home with rollator. She lives with son and daughter-in-law and has 24 hr care.  Today, pt requiring mod A transfers and took a few steps to the chair - she tolerated on 4 L O2. Considering events of hospitalization (intubated 3 days), pt did very well today and should progress well with therapy. Feel that pt could be good rehab candidate and has support at d/c. Pt currently with functional limitations due to the deficits listed below (see PT Problem List). Pt will benefit from skilled PT to increase their independence and safety with mobility to allow discharge to the venue listed below.   ?   ?   ? ?Recommendations for follow up therapy are one component of a multi-disciplinary discharge planning process, led by the attending physician.  Recommendations may be updated based on patient status, additional functional criteria and insurance authorization. ? ?Follow Up Recommendations Acute inpatient rehab (3hours/day) ? ?  ?Assistance Recommended at Discharge Frequent or constant Supervision/Assistance  ?Patient can return home with the following ? A lot of help with walking and/or transfers;A lot of help with bathing/dressing/bathroom;Assistance with cooking/housework ? ?  ?Equipment Recommendations None recommended by PT  ?Recommendations for Other Services ?    ?  ?Functional Status Assessment Patient has had a recent decline  in their functional status and demonstrates the ability to make significant improvements in function in a reasonable and predictable amount of time.  ? ?  ?Precautions / Restrictions Precautions ?Precautions: Fall  ? ?  ? ?Mobility ? Bed Mobility ?Overal bed mobility: Needs Assistance ?Bed Mobility: Supine to Sit ?  ?  ?Supine to sit: Mod assist, HOB elevated ?  ?  ?General bed mobility comments: increased time with multimodal cues and assist for legs and trunk ?  ? ?Transfers ?Overall transfer level: Needs assistance ?Equipment used: Rolling walker (2 wheels) ?Transfers: Sit to/from Stand ?Sit to Stand: Mod assist ?  ?  ?  ?  ?  ?General transfer comment: cues for hand placement with mod A to rise ?  ? ?Ambulation/Gait ?Ambulation/Gait assistance: Min assist ?Gait Distance (Feet): 3 Feet ?Assistive device: Rolling walker (2 wheels) ?Gait Pattern/deviations: Step-to pattern, Shuffle ?Gait velocity: decreased ?  ?  ?General Gait Details: Pt able to take small steps to chair ; fatigued Easily; assist with RW ? ?Stairs ?  ?  ?  ?  ?  ? ?Wheelchair Mobility ?  ? ?Modified Rankin (Stroke Patients Only) ?  ? ?  ? ?Balance Overall balance assessment: Needs assistance ?Sitting-balance support: No upper extremity supported ?Sitting balance-Leahy Scale: Good ?  ?  ?Standing balance support: Bilateral upper extremity supported, Reliant on assistive device for balance ?Standing balance-Leahy Scale: Poor ?  ?  ?  ?  ?  ?  ?  ?  ?  ?  ?  ?  ?   ? ? ? ?Pertinent Vitals/Pain Pain Assessment ?Pain Assessment: No/denies pain  ? ? ?Home Living Family/patient expects to be  discharged to:: Private residence ?Living Arrangements: Children ?Available Help at Discharge: Family;Available 24 hours/day ?Type of Home: House ?Home Access: Ramped entrance ?  ?  ?  ?Home Layout: One level ?Home Equipment: Air cabin crew (4 wheels);BSC/3in1 ?   ?  ?Prior Function   ?  ?  ?  ?  ?  ?  ?Mobility Comments: Pt could ambulate in home and short  community ambulation with rollator ?ADLs Comments: Independent with ADLs; family does IALD ?  ? ? ?Hand Dominance  ?   ? ?  ?Extremity/Trunk Assessment  ? Upper Extremity Assessment ?Upper Extremity Assessment: LUE deficits/detail;RUE deficits/detail ?RUE Deficits / Details: ROM WFL; MMT 3/5 ?LUE Deficits / Details: ROM WFL; MMT 3/5 ?  ? ?Lower Extremity Assessment ?Lower Extremity Assessment: LLE deficits/detail;RLE deficits/detail ?RLE Deficits / Details: ROM WFL; MMT 3/5 ?LLE Deficits / Details: ROM WFL; MMT 3/5 ?  ? ?Cervical / Trunk Assessment ?Cervical / Trunk Assessment: Normal  ?Communication  ? Communication: No difficulties  ?Cognition Arousal/Alertness: Awake/alert ?Behavior During Therapy: Flat affect ?Overall Cognitive Status: Within Functional Limits for tasks assessed ?  ?  ?  ?  ?  ?  ?  ?  ?  ?  ?  ?  ?  ?  ?  ?  ?  ?  ?  ? ?  ?General Comments General comments (skin integrity, edema, etc.): Pt on 4 L O2 with sats 97% rest; 89% transfers ? ?  ?Exercises    ? ?Assessment/Plan  ?  ?PT Assessment Patient needs continued PT services  ?PT Problem List Decreased mobility;Decreased strength;Decreased activity tolerance;Decreased balance;Cardiopulmonary status limiting activity;Decreased knowledge of use of DME ? ?   ?  ?PT Treatment Interventions DME instruction;Therapeutic activities;Gait training;Therapeutic exercise;Patient/family education;Balance training;Functional mobility training   ? ?PT Goals (Current goals can be found in the Care Plan section)  ?Acute Rehab PT Goals ?Patient Stated Goal: return to walking ?PT Goal Formulation: With patient/family ?Time For Goal Achievement: 08/22/21 ?Potential to Achieve Goals: Good ? ?  ?Frequency Min 3X/week ?  ? ? ?Co-evaluation   ?  ?  ?  ?  ? ? ?  ?AM-PAC PT "6 Clicks" Mobility  ?Outcome Measure Help needed turning from your back to your side while in a flat bed without using bedrails?: A Little ?Help needed moving from lying on your back to sitting on the  side of a flat bed without using bedrails?: A Lot ?Help needed moving to and from a bed to a chair (including a wheelchair)?: A Little ?Help needed standing up from a chair using your arms (e.g., wheelchair or bedside chair)?: A Lot ?Help needed to walk in hospital room?: Total ?Help needed climbing 3-5 steps with a railing? : Total ?6 Click Score: 12 ? ?  ?End of Session Equipment Utilized During Treatment: Gait belt;Oxygen ?Activity Tolerance: Patient tolerated treatment well ?Patient left: in chair;with call bell/phone within reach;with family/visitor present ?Nurse Communication: Mobility status ?PT Visit Diagnosis: Other abnormalities of gait and mobility (R26.89);Muscle weakness (generalized) (M62.81) ?  ? ?Time: 9211-9417 ?PT Time Calculation (min) (ACUTE ONLY): 22 min ? ? ?Charges:   PT Evaluation ?$PT Eval Moderate Complexity: 1 Mod ?  ?  ?   ? ? ?Abran Richard, PT ?Acute Rehab Services ?Pager [phone] ?[facility] [phone] ? ? ?[name] ?08/08/2021, 5:13 PM ? ?

## 2021-08-09 DIAGNOSIS — R570 Cardiogenic shock: Secondary | ICD-10-CM | POA: Diagnosis not present

## 2021-08-09 LAB — CBC WITH DIFFERENTIAL/PLATELET
Abs Immature Granulocytes: 0.05 10*3/uL (ref 0.00–0.07)
Basophils Absolute: 0.1 10*3/uL (ref 0.0–0.1)
Basophils Relative: 0 %
Eosinophils Absolute: 0.7 10*3/uL — ABNORMAL HIGH (ref 0.0–0.5)
Eosinophils Relative: 6 %
HCT: 40 % (ref 36.0–46.0)
Hemoglobin: 12.9 g/dL (ref 12.0–15.0)
Immature Granulocytes: 0 %
Lymphocytes Relative: 10 %
Lymphs Abs: 1.3 10*3/uL (ref 0.7–4.0)
MCH: 29.3 pg (ref 26.0–34.0)
MCHC: 32.3 g/dL (ref 30.0–36.0)
MCV: 90.7 fL (ref 80.0–100.0)
Monocytes Absolute: 1.1 10*3/uL — ABNORMAL HIGH (ref 0.1–1.0)
Monocytes Relative: 8 %
Neutro Abs: 10 10*3/uL — ABNORMAL HIGH (ref 1.7–7.7)
Neutrophils Relative %: 76 %
Platelets: 462 10*3/uL — ABNORMAL HIGH (ref 150–400)
RBC: 4.41 MIL/uL (ref 3.87–5.11)
RDW: 13.9 % (ref 11.5–15.5)
WBC: 13.2 10*3/uL — ABNORMAL HIGH (ref 4.0–10.5)
nRBC: 0 % (ref 0.0–0.2)

## 2021-08-09 LAB — BASIC METABOLIC PANEL
Anion gap: 10 (ref 5–15)
BUN: 10 mg/dL (ref 8–23)
CO2: 29 mmol/L (ref 22–32)
Calcium: 9.1 mg/dL (ref 8.9–10.3)
Chloride: 99 mmol/L (ref 98–111)
Creatinine, Ser: 0.45 mg/dL (ref 0.44–1.00)
GFR, Estimated: >60 mL/min (ref >60)
Glucose, Bld: 137 mg/dL — ABNORMAL HIGH (ref 70–99)
Potassium: 3.8 mmol/L (ref 3.5–5.1)
Sodium: 138 mmol/L (ref 135–145)

## 2021-08-09 LAB — GLUCOSE, CAPILLARY
Glucose-Capillary: 144 mg/dL — ABNORMAL HIGH (ref 70–99)
Glucose-Capillary: 146 mg/dL — ABNORMAL HIGH (ref 70–99)
Glucose-Capillary: 150 mg/dL — ABNORMAL HIGH (ref 70–99)
Glucose-Capillary: 157 mg/dL — ABNORMAL HIGH (ref 70–99)
Glucose-Capillary: 199 mg/dL — ABNORMAL HIGH (ref 70–99)
Glucose-Capillary: 228 mg/dL — ABNORMAL HIGH (ref 70–99)

## 2021-08-09 MED ORDER — LOSARTAN POTASSIUM 25 MG PO TABS
25.0000 mg | ORAL_TABLET | Freq: Every day | ORAL | Status: DC
  Administered 2021-08-09 – 2021-08-11 (×3): 25 mg via ORAL
  Filled 2021-08-09 (×3): qty 1

## 2021-08-09 MED ORDER — ENOXAPARIN SODIUM 40 MG/0.4ML IJ SOSY
40.0000 mg | PREFILLED_SYRINGE | INTRAMUSCULAR | Status: DC
  Administered 2021-08-10: 40 mg via SUBCUTANEOUS
  Filled 2021-08-09: qty 0.4

## 2021-08-09 MED ORDER — POTASSIUM CHLORIDE CRYS ER 20 MEQ PO TBCR
20.0000 meq | EXTENDED_RELEASE_TABLET | Freq: Once | ORAL | Status: AC
  Administered 2021-08-09: 20 meq via ORAL
  Filled 2021-08-09: qty 1

## 2021-08-09 NOTE — Progress Notes (Signed)
?PROGRESS NOTE ? ? ?[name]  SWN:[id number] DOB: Nov 01, 1940 DOA: 08/03/2021 ?PCP: [name], MD  ?Brief Narrative:  ?81 year old black female ?LAD with BMS 2002-last echo EF 65-70% 03/2021, ulcerative colitis, DM TY 2, renal cyst?  RCC versus angiomyolipoma followed by Dr.Wrenn of urology ?Admitted by critical care medicine with nausea vomiting but then found unresponsive 08/03/2020 required intubation and pressor initiation ?4/5 2100 LKW ?4/6 Unresponsive at home, hypoxic to 40%. BIB EMS to ED, febrile/hypotensive, bradycardic/hypoxic. Intubated, RIJ CVC placed. Levo and Vaso started. Limited Echo with EF 37.9%, new HF.  ?4/6 CT chest shows no pulmonary embolism. Obese patient. Bibasilar and RML infiltrates. RV not enlarged but RA is on CT  ?4/6 CT head is negative, Repeat abdominal CT for cyst evaluation recommended in 3 months. ?4/7 increased sedation for agitation.  ?4/9 extubated.  ?4/10 R/LHC showed pulmonary hypertension ? ?Hospital-Problem based course ? ?Combined shock-septic + cardiogenic-CT chest negative for PE  ?decrease in EF this admission 45-50% with RV failure ?Cath 4/10 shows moderate PAH group 3 ?Cardiology feels patient is preload dependent and they are managing meds ?Current meds = losartan 25 torsemide 20 twice daily atenolol 50 twice daily amlodipine 10, continue Plavix 75 daily ?Weights inaccurate--3.5 L today ?Possible pneumonia on admission ?completed 5 days cefepime vancomycin ending 4/10--- leukocytosis stable in the 11-12 range with reactive thrombocytosis ?Repeat CBC in a.m. ?Bradycardia HTN ?Careful with beta-blockade atenolol ?Resolved hyperkalemia ?Potassium low 2.8 ?Chronic left eye ptosis left facial angle droop ?Probably underlying Bell's palsy-this is chronic ?Prior angiomyolipoma versus RCC followed by Dr. Jeffie Pollock ?I will CC Dr. Jeffie Pollock with regards to this finding ?Diverticulosis, chronic pancreatitis ?Continue Creon as well as mesalamine as per home regimen ? ? ?DVT  prophylaxis: SCD ?Code Status: Full ?Family Communication: Discussed with daughter-in-law and patient's son at the bedside as well as patient's son on the phone Mr. Gwyndolyn Saxon ?Disposition:  ?Status is: Inpatient ?Remains inpatient appropriate because:  ? ?Disposition planning ?  ?Consultants:  ?Cardiology ?Pulmonary ? ?Procedures:  ? ?Antimicrobials: Completed as above ? ? ?Subjective: ?Slightly sleepy this afternoon and was difficult to arouse by therapy but working with therapy diligently ?No chest pain no fever ?She is able to mobilize with moderate assistance ?Family definitely thinks they want to take her home-she has [facility] services at home already and they feel she would do better at home ? ?Objective: ?Vitals:  ? 08/09/21 0334 08/09/21 0428 08/09/21 0824 08/09/21 0912  ?BP: (!) 153/81 138/83 (!) 156/82 (!) 156/82  ?Pulse: 61 (!) 58 (!) 58 72  ?Resp: 17 (!) _0 ?Temp:  98.4 ?F (36.9 ?C) 98.6 ?F (37 ?C)   ?TempSrc:  Oral Oral   ?SpO2: 94% 94% 94% 97%  ?Weight:  103 kg    ?Height:      ? ? ?Intake/Output Summary (Last 24 hours) at 08/09/2021 1348 ?Last data filed at 08/09/2021 0500 ?Gross per 24 hour  ?Intake 390 ml  ?Output 2400 ml  ?Net -2010 ml  ? ?Filed Weights  ? 08/07/21 2001 08/08/21 0431 08/09/21 0428  ?Weight: 99.7 kg 99.8 kg 103 kg  ? ? ?Examination: ? ?EOMI NCAT thick neck Mallampati 4 cannot appreciate JVD 2/2 habitus moderate dentition ?S1-S2 no murmur PVCs on monitors ?Chest is clear posterolaterally abdomen soft no rebound ?Trace lower extremity edema ?Neurologically intact no focal deficit ? ?Data Reviewed: personally reviewed  ? ?CBC ?   ?Component Value Date/Time  ? WBC 13.2 (H) 08/09/2021 0259  ? RBC 4.41 08/09/2021  0259  ? HGB 12.9 08/09/2021 0259  ? HGB 12.5 09/08/2019 1134  ? HCT 40.0 08/09/2021 0259  ? HCT 38.0 09/08/2019 1134  ? PLT 462 (H) 08/09/2021 0259  ? PLT 462 (H) 09/08/2019 1134  ? MCV 90.7 08/09/2021 0259  ? MCV 92 09/08/2019 1134  ? MCH 29.3 08/09/2021 0259  ? MCHC 32.3  08/09/2021 0259  ? RDW 13.9 08/09/2021 0259  ? RDW 12.1 09/08/2019 1134  ? LYMPHSABS 1.3 08/09/2021 0259  ? MONOABS 1.1 (H) 08/09/2021 0259  ? EOSABS 0.7 (H) 08/09/2021 0259  ? BASOSABS 0.1 08/09/2021 0259  ? ? ?  Latest Ref Rng & Units 08/09/2021  ?  2:59 AM 08/08/2021  ? 11:42 AM 08/08/2021  ?  2:23 AM  ?CMP  ?Glucose 70 - 99 mg/dL 137   191   123    ?BUN 8 - 23 mg/dL _0 ?Creatinine 0.44 - 1.00 mg/dL 0.45   0.41   0.41    ?Sodium 135 - 145 mmol/L 138   137   136    ?Potassium 3.5 - 5.1 mmol/L 3.8   4.0   5.4    ?Chloride 98 - 111 mmol/L 99   101   105    ?CO2 22 - 32 mmol/L _1 ?Calcium 8.9 - 10.3 mg/dL 9.1   8.9   8.7    ? ? ? ?Radiology Studies: ?No results found. ? ? ?Scheduled Meds: ? amLODipine  10 mg Oral Daily  ? atenolol  50 mg Oral BID  ? atorvastatin  20 mg Oral Daily  ? clopidogrel  75 mg Oral Daily  ? enoxaparin (LOVENOX) injection  40 mg Subcutaneous Q24H  ? ferrous sulfate  325 mg Oral BID  ? gabapentin  300 mg Oral Q12H  ? lidocaine  1-2 patch Transdermal Q24H  ? lipase/protease/amylase  24,000 Units Oral TID AC  ? losartan  25 mg Oral Daily  ? mouth rinse  15 mL Mouth Rinse BID  ? mesalamine  2.4 g Oral BID  ? mometasone-formoterol  2 puff Inhalation BID  ? pantoprazole  40 mg Oral BID  ? sodium chloride flush  3 mL Intravenous Q12H  ? torsemide  20 mg Oral BID  ? ?Continuous Infusions: ? sodium chloride Stopped (08/05/21 0013)  ? sodium chloride    ? ? ? LOS: 6 days  ? ?Time spent: 50 ? ?Nita Sells, MD ?[facility] ?To contact the attending provider between 7A-7P or the covering provider during after hours 7P-7A, please log into the web site [email or web address] and access using universal [place] password for that web site. If you do not have the password, please call the hospital operator. ? ?08/09/2021, 1:48 PM  ? ? ?

## 2021-08-09 NOTE — Progress Notes (Addendum)
Physical Therapy Treatment ?Patient Details ?Name: [name] ?MRN: [id number] ?DOB: [birth year] ?Today's Date: 08/09/2021 ? ? ?History of Present Illness Pt is 81 yo female admitted 08/03/21 with resp failure, shock, and encephalopathy.  Pt intubated 08/03/21-08/06/21.  Heart catherization 08/06/21.  Pt with hx including but not limited to HTN, HLD, CHF (Echo 03/2021 with EF 65-70%, grade 1 DD), CAD (s/p LAD stent 2002), asthma, T2DM, diverticulosis, renal lesion (stable, RCC vs. angiomyolipoma), GERD, fibromyalgia. ? ?  ?PT Comments  ? ? Pt was very lethargic upon arrival, needing increased time and stimulation to awaken enough to safely ambulate. Pt was able to progress to performing transfers with minA initial 2 reps to with only min guard assist the subsequent 5 reps during the session. Pt was also able to progress to ambulating ~20 ft in one bout with a RW and min guard assist, but displayed knee instability suggestive of lower extremity muscular weakness and endurance deficits. She is at risk for falls. Educated pt and her family on her risk for falls and need for assistance with standing to ensure safety along with her need for supplemental O2 at this time. They verbalized understanding. Discussed the differences between HHPT, OPPT, and AIR with pt and family, and they plan to discuss it amongst themselves further whether they would like to pursue AIR or take pt home with HHPT instead. Will continue to follow acutely. ? ?   ?Recommendations for follow up therapy are one component of a multi-disciplinary discharge planning process, led by the attending physician.  Recommendations may be updated based on patient status, additional functional criteria and insurance authorization. ? ?Follow Up Recommendations ? Acute inpatient rehab (3hours/day) (if declines AIR, then HHPT) ?  ?  ?Assistance Recommended at Discharge Frequent or constant Supervision/Assistance  ?Patient can return home with the following A lot of help  with bathing/dressing/bathroom;Assistance with cooking/housework;A little help with walking and/or transfers;Assist for transportation;Help with stairs or ramp for entrance ?  ?Equipment Recommendations ? BSC/3in1  ?  ?Recommendations for Other Services   ? ? ?  ?Precautions / Restrictions Precautions ?Precautions: Fall ?Precaution Comments: watch SpO2 ?Restrictions ?Weight Bearing Restrictions: No  ?  ? ?Mobility ? Bed Mobility ?Overal bed mobility: Needs Assistance ?Bed Mobility: Supine to Sit, Sit to Supine ?  ?  ?Supine to sit: Mod assist, HOB elevated ?Sit to supine: Min assist ?  ?General bed mobility comments: Pt lethargic and needing modA to ascend trunk and scoot hips to EOB initially. MinA to manage R leg up onto bed with return to supine. ?  ? ?Transfers ?Overall transfer level: Needs assistance ?Equipment used: Rolling walker (2 wheels) ?Transfers: Sit to/from Stand ?Sit to Stand: Min assist, Min guard ?  ?  ?  ?  ?  ?General transfer comment: Pt attempting to pull up on RW repeatedly, needing reminders to push up from bed. Initial x2 reps pt required minA to power up to stand. Final x5 reps pt required min guard assist ?  ? ?Ambulation/Gait ?Ambulation/Gait assistance: Min guard ?Gait Distance (Feet): 20 Feet (x2 bouts of ~20 ft > ~12 ft) ?Assistive device: Rolling walker (2 wheels) ?Gait Pattern/deviations: Step-to pattern, Shuffle, Trunk flexed ?Gait velocity: reduced ?Gait velocity interpretation: <1.31 ft/sec, indicative of household ambulator ?  ?General Gait Details: Pt with slow, shuffling steps in the room. Increased knee instability noted, but no buckling, as distance progressed. Min guard for safety, no LOB., ? ? ?Stairs ?  ?  ?  ?  ?  ? ? ?  Wheelchair Mobility ?  ? ?Modified Rankin (Stroke Patients Only) ?  ? ? ?  ?Balance Overall balance assessment: Needs assistance ?Sitting-balance support: No upper extremity supported, Single extremity supported, Bilateral upper extremity supported, Feet  supported ?Sitting balance-Leahy Scale: Fair ?Sitting balance - Comments: UE support intermittently. ?  ?Standing balance support: Bilateral upper extremity supported, Reliant on assistive device for balance ?Standing balance-Leahy Scale: Poor ?Standing balance comment: Reliant on RW ?  ?  ?  ?  ?  ?  ?  ?  ?  ?  ?  ?  ? ?  ?Cognition Arousal/Alertness: Awake/alert, Lethargic ?Behavior During Therapy: Flat affect ?Overall Cognitive Status: Within Functional Limits for tasks assessed ?  ?  ?  ?  ?  ?  ?  ?  ?  ?  ?  ?  ?  ?  ?  ?  ?General Comments: Pt very lethargic having a difficult time to awaken and keep awake initially. Eventually, pt able to maintain eyes open and follow directions appropriately. ?  ?  ? ?  ?Exercises Other Exercises ?Other Exercises: Sit <> stand 5x in a row from EOB using UE ? ?  ?General Comments General comments (skin integrity, edema, etc.): SpO2 down to 81% on RA at rest, >/= 90% on 2L with gait but decreased to 87% with quick rebound to >/= 90% once sitting after gait bout; educated pt and family on AIR vs HHPT vs OPPT and that the pt is at risk for falls, easily fatigues, and will need someone guarding her anytime she is standing for safety, they verbalized understanding ?  ?  ? ?Pertinent Vitals/Pain Pain Assessment ?Pain Assessment: Faces ?Faces Pain Scale: Hurts a little bit ?Pain Location: generalized with mobility ?Pain Descriptors / Indicators: Grimacing ?Pain Intervention(s): Limited activity within patient's tolerance, Monitored during session, Repositioned  ? ? ?Home Living Family/patient expects to be discharged to:: Private residence ?Living Arrangements: Children ?Available Help at Discharge: Family;Available 24 hours/day ?Type of Home: House ?Home Access: Ramped entrance ?  ?  ?  ?Home Layout: One level ?Home Equipment: Air cabin crew (4 wheels) ?   ?  ?Prior Function    ?  ?  ?   ? ?PT Goals (current goals can now be found in the care plan section) Acute Rehab PT  Goals ?Patient Stated Goal: to get better and go home ?PT Goal Formulation: With patient/family ?Time For Goal Achievement: 08/22/21 ?Potential to Achieve Goals: Good ?Progress towards PT goals: Progressing toward goals ? ?  ?Frequency ? ? ? Min 3X/week ? ? ? ?  ?PT Plan Equipment recommendations need to be updated  ? ? ?Co-evaluation   ?  ?  ?  ?  ? ?  ?AM-PAC PT "6 Clicks" Mobility   ?Outcome Measure ? Help needed turning from your back to your side while in a flat bed without using bedrails?: A Little ?Help needed moving from lying on your back to sitting on the side of a flat bed without using bedrails?: A Lot ?Help needed moving to and from a bed to a chair (including a wheelchair)?: A Little ?Help needed standing up from a chair using your arms (e.g., wheelchair or bedside chair)?: A Little ?Help needed to walk in hospital room?: A Little ?Help needed climbing 3-5 steps with a railing? : Total ?6 Click Score: 15 ? ?  ?End of Session Equipment Utilized During Treatment: Gait belt;Oxygen ?Activity Tolerance: Patient tolerated treatment well ?Patient left: in bed;with call bell/phone  within reach;with bed alarm set;with family/visitor present ?Nurse Communication: Mobility status;Other (comment) (sats) ?PT Visit Diagnosis: Other abnormalities of gait and mobility (R26.89);Muscle weakness (generalized) (M62.81);Unsteadiness on feet (R26.81);Difficulty in walking, not elsewhere classified (R26.2) ?  ? ? ?Time: 4739-5844 ?PT Time Calculation (min) (ACUTE ONLY): 61 min ? ?Charges:  $Gait Training: 8-22 mins ?$Therapeutic Exercise: 8-22 mins ?$Therapeutic Activity: 23-37 mins          ?          ? ?Moishe Spice, PT, DPT ?Acute Rehabilitation Services  ?Pager: [phone] ?Office: [phone] ? ? ? ?Maretta Bees Pettis ?08/09/2021, 3:31 PM ? ?

## 2021-08-09 NOTE — Progress Notes (Signed)
Inpatient Rehabilitation Admissions Coordinator  ? ?I met with patient , her daughter in law, Butch Penny, granddaughter, Aniceto Boss and spoke via phone with son , Gwyndolyn Saxon. They prefer direct discharge home and have set up Home health with [facility]. They do no wish to pursue CIR admit. They would like a projected discharge date to complete arrangements. We will not pursue Cir and will sign off. I will alert acute team and TOC. ? ?Danne Baxter, RN, MSN ?Rehab Admissions Coordinator ?[phone] ?08/09/2021 11:14 AM ? ?

## 2021-08-09 NOTE — Progress Notes (Signed)
Inpatient Rehab Admissions Coordinator:  ? ?Per therapy recommendations,  patient was screened for CIR candidacy by Clemens Catholic, MS, CCC-SLP. At this time, Pt. Appears to be a a potential candidate for CIR. I will place   order for rehab consult per protocol for full assessment. Please contact me any with questions. ? ?Clemens Catholic, MS, CCC-SLP ?Rehab Admissions Coordinator  ?[phone] (celll) ?[phone] (office) ? ?

## 2021-08-09 NOTE — Evaluation (Signed)
Occupational Therapy Evaluation ?Patient Details ?Name: [name] ?MRN: [id number] ?DOB: 26-Feb-1941 ?Today's Date: 08/09/2021 ? ? ?History of Present Illness Pt is 81 yo female admitted 08/03/21 with resp failure, shock, and encephalopathy.  Pt intubated 08/03/21-08/06/21.  Heart catherization 08/06/21.  Pt with hx including but not limited to HTN, HLD, CHF (Echo 03/2021 with EF 65-70%, grade 1 DD), CAD (s/p LAD stent 2002), asthma, T2DM, diverticulosis, renal lesion (stable, RCC vs. angiomyolipoma), GERD, fibromyalgia.  ? ?Clinical Impression ?  ?Pt reports independence at baseline with ADLs, uses rollator for mobility (reports using it more as a seat than mobility device). Pt currently min-mod A for ADLs, minA  for transfers with RW. Pt min guard -mod A for bed mobility, needing increased assist to bring BLE into bed. Pt with SpO2 in 90's on 2L O2 with variable reading, reapplied new sensor. Pt SpO2 reading 78% with good wave form for ~30 seconds when sitting EOB on RA. Reapplied O2, spO2 back up into low 90's. RN notified. Pt presenting with impairments listed below, will follow acutely. Noted pt declining AIR, recommend HHOT at d/c.  ?   ? ?Recommendations for follow up therapy are one component of a multi-disciplinary discharge planning process, led by the attending physician.  Recommendations may be updated based on patient status, additional functional criteria and insurance authorization.  ? ?Follow Up Recommendations ? Home health OT  ?  ?Assistance Recommended at Discharge Intermittent Supervision/Assistance  ?Patient can return home with the following A little help with walking and/or transfers;A lot of help with bathing/dressing/bathroom;Assistance with cooking/housework;Assist for transportation;Help with stairs or ramp for entrance ? ?  ?Functional Status Assessment ? Patient has had a recent decline in their functional status and demonstrates the ability to make significant improvements in function in a  reasonable and predictable amount of time.  ?Equipment Recommendations ? BSC/3in1  ?  ?Recommendations for Other Services   ? ? ?  ?Precautions / Restrictions Precautions ?Precautions: Fall ?Restrictions ?Weight Bearing Restrictions: No  ? ?  ? ?Mobility Bed Mobility ?Overal bed mobility: Needs Assistance ?Bed Mobility: Supine to Sit, Sit to Supine ?  ?  ?Supine to sit: Min guard ?Sit to supine: Mod assist ?  ?General bed mobility comments: mod A to bring BLE's into bed ?  ? ?Transfers ?Overall transfer level: Needs assistance ?Equipment used: Rolling walker (2 wheels) ?Transfers: Sit to/from Stand ?Sit to Stand: Min assist ?  ?  ?  ?  ?  ?General transfer comment: cues for hand placement on RW ?  ? ?  ?Balance Overall balance assessment: Needs assistance ?Sitting-balance support: No upper extremity supported ?Sitting balance-Leahy Scale: Good ?  ?  ?Standing balance support: Bilateral upper extremity supported, Reliant on assistive device for balance ?Standing balance-Leahy Scale: Poor ?  ?  ?  ?  ?  ?  ?  ?  ?  ?  ?  ?  ?   ? ?ADL either performed or assessed with clinical judgement  ? ?ADL Overall ADL's : Needs assistance/impaired ?Eating/Feeding: Sitting;Minimal assistance ?  ?Grooming: Sitting;Minimal assistance ?  ?Upper Body Bathing: Minimal assistance;Sitting ?  ?Lower Body Bathing: Moderate assistance;Sitting/lateral leans ?  ?Upper Body Dressing : Minimal assistance;Sitting ?  ?Lower Body Dressing: Moderate assistance;Sitting/lateral leans ?  ?Toilet Transfer: Minimal assistance;Rolling walker (2 wheels);Regular Toilet ?  ?Toileting- Clothing Manipulation and Hygiene: Supervision/safety ?  ?  ?  ?Functional mobility during ADLs: Minimal assistance;Rolling walker (2 wheels) ?   ? ? ? ?Vision   ?  Vision Assessment?: No apparent visual deficits  ?   ?Perception   ?  ?Praxis   ?  ? ?Pertinent Vitals/Pain Pain Assessment ?Pain Assessment: No/denies pain  ? ? ? ?Hand Dominance   ?  ?Extremity/Trunk Assessment  Upper Extremity Assessment ?Upper Extremity Assessment: Generalized weakness ?  ?Lower Extremity Assessment ?Lower Extremity Assessment: Defer to PT evaluation ?  ?Cervical / Trunk Assessment ?Cervical / Trunk Assessment: Normal ?  ?Communication Communication ?Communication: No difficulties ?  ?Cognition Arousal/Alertness: Awake/alert ?Behavior During Therapy: Flat affect ?Overall Cognitive Status: Within Functional Limits for tasks assessed ?  ?  ?  ?  ?  ?  ?  ?  ?  ?  ?  ?  ?  ?  ?  ?  ?  ?  ?  ?General Comments  Pt on 2L O2 upon arrival, applied new O2 sensor for reading, SpO2 in  90's, removed O2, reading at 78% sitting EOB with good wave form, however intermittently reading SpO2. Pt asymptomatic ? ?  ?Exercises   ?  ?Shoulder Instructions    ? ? ?Home Living Family/patient expects to be discharged to:: Private residence ?Living Arrangements: Children ?Available Help at Discharge: Family;Available 24 hours/day ?Type of Home: House ?Home Access: Ramped entrance ?  ?  ?Home Layout: One level ?  ?  ?Bathroom Shower/Tub: Tub/shower unit ?  ?  ?  ?  ?Home Equipment: Air cabin crew (4 wheels) ?  ?  ?  ? ?  ?Prior Functioning/Environment Prior Level of Function : Independent/Modified Independent ?  ?  ?  ?  ?  ?  ?Mobility Comments: Uses rollator in home for more as a seat vs support ?ADLs Comments: does IADLs ?  ? ?  ?  ?OT Problem List: Decreased strength;Decreased range of motion;Decreased activity tolerance;Impaired balance (sitting and/or standing);Decreased safety awareness;Decreased knowledge of use of DME or AE ?  ?   ?OT Treatment/Interventions: Self-care/ADL training;Therapeutic exercise;DME and/or AE instruction;Patient/family education;Balance training;Therapeutic activities  ?  ?OT Goals(Current goals can be found in the care plan section) Acute Rehab OT Goals ?Patient Stated Goal: none stated ?OT Goal Formulation: With patient ?Time For Goal Achievement: 08/23/21 ?Potential to Achieve Goals: Fair   ?OT Frequency: Min 3X/week ?  ? ?Co-evaluation   ?  ?  ?  ?  ? ?  ?AM-PAC OT "6 Clicks" Daily Activity     ?Outcome Measure Help from another person eating meals?: None ?Help from another person taking care of personal grooming?: A Little ?Help from another person toileting, which includes using toliet, bedpan, or urinal?: A Little ?Help from another person bathing (including washing, rinsing, drying)?: A Lot ?Help from another person to put on and taking off regular upper body clothing?: A Little ?Help from another person to put on and taking off regular lower body clothing?: A Lot ?6 Click Score: 17 ?  ?End of Session Equipment Utilized During Treatment: Gait belt;Rolling walker (2 wheels);Oxygen ?Nurse Communication: Mobility status;Other (comment) (O2) ? ?Activity Tolerance: Patient limited by fatigue;Patient limited by lethargy ?Patient left: in bed;with call bell/phone within reach;with bed alarm set;with family/visitor present ? ?OT Visit Diagnosis: Unsteadiness on feet (R26.81);Other abnormalities of gait and mobility (R26.89);Muscle weakness (generalized) (M62.81)  ?              ?Time: 1110-1141 ?OT Time Calculation (min): 31 min ?Charges:  OT General Charges ?$OT Visit: 1 Visit ?OT Evaluation ?$OT Eval Moderate Complexity: 1 Mod ?OT Treatments ?$Therapeutic Activity: 8-22 mins ? ?Lynnda Child,  OTD, OTR/L ?Acute Rehab ?[phone] ? ? ?Kaylyn Lim ?08/09/2021, 1:22 PM ?

## 2021-08-09 NOTE — Progress Notes (Addendum)
? ? Advanced Heart Failure Rounding Note ? ?PCP-Cardiologist: Candee Furbish, MD  ? ?Subjective:   ? ?R/LHC 4/10 w/ mild to moderate non-obstructive CAD with 90% lesion in apical LAD (medical therapy) + mod PAH and preserved output. PWP 15, RAP 8  ? ?No BMET this am. Check Stat. ? ? ?2.9L UOP charted yesterday. ? Weight up 7 lb. Weighed in bed. ? ?Denies shortness of breath. Feels tired today. Hasn't slept well with staff entering room throughout the night. Ambulated 3 feet yesterday. ? ? ?RHC hemodynamics ? ?Ao = 167/84 (118) ?LV = 152/11 ?RA =  8 ?RV = 64/11 ?PA = 68/26 (45) ?PCW = 15 ?Fick cardiac output/index = 6.0/3.0 ?PVR = 5.0 WU ?FA sat = 92% ?PA sat = 66%, 67% ? ? ?Objective:   ?Weight Range: ?103 kg ?Body mass index is 42.91 kg/m?.  ? ?Vital Signs:   ?Temp:  [97.8 ?F (36.6 ?C)-99 ?F (37.2 ?C)] 98.6 ?F (37 ?C) (04/12 5320) ?Pulse Rate:  [58-72] 72 (04/12 0912) ?Resp:  [15-21] 15 (04/12 0912) ?BP: (138-156)/(80-97) 156/82 (04/12 0912) ?SpO2:  [94 %-97 %] 97 % (04/12 0912) ?FiO2 (%):  [24 %] 24 % (04/12 0912) ?Weight:  [103 kg] 103 kg (04/12 0428) ?Last BM Date : 08/08/21 ? ?Weight change: ?Filed Weights  ? 08/07/21 2001 08/08/21 0431 08/09/21 0428  ?Weight: 99.7 kg 99.8 kg 103 kg  ? ? ?Intake/Output:  ? ?Intake/Output Summary (Last 24 hours) at 08/09/2021 1111 ?Last data filed at 08/09/2021 0500 ?Gross per 24 hour  ?Intake 390 ml  ?Output 2900 ml  ?Net -2510 ml  ? ? ?Physical Exam  ?  ?General:  Sitting up on side of bed. No distress. ?HEENT: normal ?Neck: supple. JVD difficult to assess. Carotids 2+ bilat; no bruits.  ?Cor: PMI nondisplaced. Regular rate & rhythm. No rubs, gallops or murmurs. ?Lungs: diminished ?Abdomen: obese, soft, nontender, nondistended.  ?Extremities: no cyanosis, clubbing, rash, trace edema ?Neuro: alert & orientedx3, cranial nerves grossly intact. moves all 4 extremities w/o difficulty. Affect pleasant ? ? ?Telemetry  ?SR with PACs, 60s-70s ? ? ?Labs  ?  ?CBC ?Recent Labs  ?   08/08/21 ?0223 08/08/21 ?1142 08/09/21 ?0259  ?WBC 10.1 12.5* 13.2*  ?NEUTROABS 7.5  --  10.0*  ?HGB 11.7* 12.8 12.9  ?HCT 36.8 39.8 40.0  ?MCV 94.6 91.7 90.7  ?PLT PLATELET CLUMPS NOTED ON SMEAR, UNABLE TO ESTIMATE 443* 462*  ? ?Basic Metabolic Panel ?Recent Labs  ?  08/07/21 ?0624 08/07/21 ?1001 08/08/21 ?0223 08/08/21 ?1142  ?NA 139   < > 136 137  ?K 3.9   < > 5.4* 4.0  ?CL 103  --  105 101  ?CO2 27  --  21* 28  ?GLUCOSE 147*  --  123* 191*  ?BUN 8  --  10 10  ?CREATININE 0.50  --  0.41* 0.41*  ?CALCIUM 9.1  --  8.7* 8.9  ?MG 1.9  --   --   --   ? < > = values in this interval not displayed.  ? ?Liver Function Tests ?No results for input(s): AST, ALT, ALKPHOS, BILITOT, PROT, ALBUMIN in the last 72 hours. ? ?No results for input(s): LIPASE, AMYLASE in the last 72 hours. ?Cardiac Enzymes ?No results for input(s): CKTOTAL, CKMB, CKMBINDEX, TROPONINI in the last 72 hours. ? ?BNP: ?BNP (last 3 results) ?Recent Labs  ?  08/03/21 ?1035  ?BNP 304.1*  ? ? ?ProBNP (last 3 results) ?No results for input(s): PROBNP in the last  8760 hours. ? ? ?D-Dimer ?No results for input(s): DDIMER in the last 72 hours. ?Hemoglobin A1C ?No results for input(s): HGBA1C in the last 72 hours. ?Fasting Lipid Panel ?No results for input(s): CHOL, HDL, LDLCALC, TRIG, CHOLHDL, LDLDIRECT in the last 72 hours. ?Thyroid Function Tests ?No results for input(s): TSH, T4TOTAL, T3FREE, THYROIDAB in the last 72 hours. ? ?Invalid input(s): FREET3 ? ?Other results: ? ? ?Imaging  ? ? ?No results found. ? ? ?Medications:   ? ? ?Scheduled Medications: ? amLODipine  10 mg Oral Daily  ? atenolol  50 mg Oral BID  ? atorvastatin  20 mg Oral Daily  ? clopidogrel  75 mg Oral Daily  ? enoxaparin (LOVENOX) injection  40 mg Subcutaneous Q24H  ? ferrous sulfate  325 mg Oral BID  ? gabapentin  300 mg Oral Q12H  ? lidocaine  1-2 patch Transdermal Q24H  ? lipase/protease/amylase  24,000 Units Oral TID AC  ? mouth rinse  15 mL Mouth Rinse BID  ? mesalamine  2.4 g Oral BID   ? mometasone-formoterol  2 puff Inhalation BID  ? pantoprazole  40 mg Oral BID  ? sodium chloride flush  3 mL Intravenous Q12H  ? torsemide  20 mg Oral BID  ? ? ?Infusions: ? sodium chloride Stopped (08/05/21 0013)  ? sodium chloride    ? ? ?PRN Medications: ?sodium chloride, acetaminophen, ALPRAZolam, HYDROcodone-acetaminophen, lipase/protease/amylase, olopatadine, ondansetron (ZOFRAN) IV, phenol, sodium chloride flush ? ? ? ?Patient Profile  ? ?80 y/o woman with DM2, UC, obesity, HFPEF, CAD with remote LAD stents, fibromyalgia admitted with altered mental status and respiratory arrest. Found to have lactic acidosis and AKI. WBC 14K. Echo EF 45-50% with dilated and hypokinetic RV. CT no PE. +Evidence of significant PH. Developed junctional bradycardia in setting of hyperkalemia w/ k > 7. Admitted to CCU.  ? ?Assessment/Plan  ? ?1. Shock , unclear etiology. ? Septic +/- cardiogenic ?- Hypotensive on arrival. Placed on Norepi and given IV fluids.  ?- CTA negative for PE.  ?- Echo  45-50% RV dilated , septal flattening. Concern for RV failure/pulmonary HTN. Suspect preload dependent.  ?- Suspect due to PNA in setting of underlying PAH/cor pulmonale. Completed Abx ?- Resolved, hemodynamically stable off pressors  ? ?2. Acute Hypoxic Respiratory Failure with RML PNA ?- Extubated 4/9 ?- Completed Abx  ?- CXR 4/10 w/ improved bilateral lung volumes and ventilation ?- O2 requirements improving, down to 1L [place] this am ?- CCM has s/o  ? ?3. Pulmonary HTN -> cor pulmonale ?- Marked RV strain on echo  ?- RHC c/w Moderate PAH, Likely Group 3 PAH ?- will need eval for OSA/OHS, patient has refused CPAP at night while here ?- Will ask TOC to assist with arranging home CPAP at discharge ?- R/L filling pressures not markedly high on cath, now on po Torsemide 20 mg BID. Good UOP. Doubt weight acurate. ?- Add losartan 25 mg daily. BP elevated. ?- BMET today ?  ?4 Bradycardia  ?- Due to hyperkalemia  ?- Resolved ?  ?5. Hyperkalemia  ?-  K 7.9 on admit. Given calcium gluconate + Lokelma  ?- BMP pending. ?  ?6. H/O CAD ?- BMS prox LAD with 3 subsequent caths showing patent stent ?- HS Trop 131>136 ?- No s/s angina ?- LHC this admit w/ mild to moderate non-obstructive CAD with 90% lesion in apical LAD ?- medical management ?  ?7. DMII ?- per IM  ? ?8. Hypertension ?- On amlodipine 10 mg daily +  atenolol 50 mg daily ?- Add losartan 25 mg daily ? ? ?PT has seen. Recommending acute inpatient rehab at discharge. Encouraged her to mobilize.  ? ? ?Length of Stay: 6 ? ?FINCH, LINDSAY N, PA-C  ?08/09/2021, 11:11 AM ? ?Advanced Heart Failure Team ?Pager [phone] (M-F; 7a - 5p)  ?Please contact [facility] for night-coverage after hours (5p -7a ) and weekends on [email or web address] ? ? ?Patient seen and examined with the above-signed Advanced Practice Provider and/or Housestaff. I personally reviewed laboratory data, imaging studies and relevant notes. I independently examined the patient and formulated the important aspects of the plan. I have edited the note to reflect any of my changes or salient points. I have personally discussed the plan with the patient and/or family. ? ?General:  Sitting up in bed. No resp difficulty ?HEENT: normal ?Neck: supple. JVP hard to see. Carotids 2+ bilat; no bruits. No lymphadenopathy or thryomegaly appreciated. ?Cor: PMI nondisplaced. Regular rate & rhythm. No rubs, gallops or murmurs. ?Lungs: clear ?Abdomen: obese soft, nontender, nondistended. No hepatosplenomegaly. No bruits or masses. Good bowel sounds. ?Extremities: no cyanosis, clubbing, rash, edema ?Neuro: alert & orientedx3, cranial nerves grossly intact. moves all 4 extremities w/o difficulty. Affect pleasant ? ?Overall doing well. Volume status looks good. Will need to get her on CPAP/BIPAP +/- weight loss and then can reassess need for selective pulmonary vasodilators.  ? ?We will follow from a distance.  ? ?Glori Bickers, MD  ?6:29 PM ? ? ? ? ? ? ?

## 2021-08-09 NOTE — Progress Notes (Signed)
SATURATION QUALIFICATIONS: (This note is used to comply with regulatory documentation for home oxygen) ? ?Patient Saturations on Room Air at Rest = 81% ? ?Patient Saturations on Room Air while Ambulating = N/A ? ?Patient Saturations on 2 Liters of oxygen while Ambulating = 90% ? ?Please briefly explain why patient needs home oxygen: Pt requires supplemental O2 to maintain sats >/= 90% at rest and with mobility. ? ? ?Moishe Spice, PT, DPT ?Acute Rehabilitation Services  ?Pager: [phone] ?Office: [phone] ? ?

## 2021-08-09 NOTE — TOC Progression Note (Signed)
Transition of Care (TOC) - Progression Note  ? ? ?Patient Details  ?Name: [name] ?MRN: [id number] ?Date of Birth: 1940-05-29 ? ?Transition of Care (TOC) CM/SW Contact  ?Tom-Johnson, Renea Ee, RN ?Phone Number: ?08/09/2021, 3:52 PM ? ?Clinical Narrative:    ? ?CM notified by CSW that patient abd son, Gwyndolyn Saxon declined AIR and requests to go home with home health. CM called and spoke with Gwyndolyn Saxon [phone] and he stated that he has arranged home health with [facility]. CM called [facility] [phone] and spoke with [place] and she states that they do not render therapy services, they only do medical management. Son notified and requested AIR. CM spoke with Pamala Hurry with AIR and she states patient's insurance might not approve. Son does not have preference for home health. CM sent referral to [facility] and Delsa Sale voiced acceptance but will not start till the weekend. MD and RN notified via secure chat. Information on AVS.  ?CM will continue to follow with needs.  ? ? ?Expected Discharge Plan: [place] ?Barriers to Discharge: Continued Medical Work up ? ?Expected Discharge Plan and Services ?Expected Discharge Plan: [place] ?  ?  ?  ?  ?                ?  ?  ?  ?  ?  ?HH Arranged: PT, OT ?[place] Agency: [facility] ?Date HH Agency Contacted: 08/09/21 ?Time [place]: 1500 ?Representative spoke with at [place]: Delsa Sale ? ? ?Social Determinants of Health (SDOH) Interventions ?  ? ?Readmission Risk Interventions ?   ? View : No data to display.  ?  ?  ?  ? ? ?

## 2021-08-09 NOTE — Progress Notes (Signed)
Overnight pulse oximetry placed on patient. Currently on 3L HFNC. ?

## 2021-08-09 NOTE — Progress Notes (Signed)
Patient states she does not wear CPAP at home. Overnight pulse ox ordered for HS. Will hold CPAP for O2 study. Patient currently on 3L HFNC. ?

## 2021-08-10 ENCOUNTER — Other Ambulatory Visit: Payer: Self-pay | Admitting: Gastroenterology

## 2021-08-10 DIAGNOSIS — R570 Cardiogenic shock: Secondary | ICD-10-CM | POA: Diagnosis not present

## 2021-08-10 LAB — RENAL FUNCTION PANEL
Albumin: 3.3 g/dL — ABNORMAL LOW (ref 3.5–5.0)
Anion gap: 6 (ref 5–15)
BUN: 14 mg/dL (ref 8–23)
CO2: 29 mmol/L (ref 22–32)
Calcium: 8.7 mg/dL — ABNORMAL LOW (ref 8.9–10.3)
Chloride: 101 mmol/L (ref 98–111)
Creatinine, Ser: 0.49 mg/dL (ref 0.44–1.00)
GFR, Estimated: >60 mL/min (ref >60)
Glucose, Bld: 154 mg/dL — ABNORMAL HIGH (ref 70–99)
Phosphorus: 4.2 mg/dL (ref 2.5–4.6)
Potassium: 3.4 mmol/L — ABNORMAL LOW (ref 3.5–5.1)
Sodium: 136 mmol/L (ref 135–145)

## 2021-08-10 LAB — GLUCOSE, CAPILLARY
Glucose-Capillary: 153 mg/dL — ABNORMAL HIGH (ref 70–99)
Glucose-Capillary: 160 mg/dL — ABNORMAL HIGH (ref 70–99)
Glucose-Capillary: 171 mg/dL — ABNORMAL HIGH (ref 70–99)
Glucose-Capillary: 206 mg/dL — ABNORMAL HIGH (ref 70–99)
Glucose-Capillary: 209 mg/dL — ABNORMAL HIGH (ref 70–99)

## 2021-08-10 LAB — CBC WITH DIFFERENTIAL/PLATELET
Abs Immature Granulocytes: 0.07 10*3/uL (ref 0.00–0.07)
Basophils Absolute: 0.1 10*3/uL (ref 0.0–0.1)
Basophils Relative: 0 %
Eosinophils Absolute: 0.6 10*3/uL — ABNORMAL HIGH (ref 0.0–0.5)
Eosinophils Relative: 5 %
HCT: 39.7 % (ref 36.0–46.0)
Hemoglobin: 13 g/dL (ref 12.0–15.0)
Immature Granulocytes: 1 %
Lymphocytes Relative: 12 %
Lymphs Abs: 1.6 10*3/uL (ref 0.7–4.0)
MCH: 29.5 pg (ref 26.0–34.0)
MCHC: 32.7 g/dL (ref 30.0–36.0)
MCV: 90.2 fL (ref 80.0–100.0)
Monocytes Absolute: 1.1 10*3/uL — ABNORMAL HIGH (ref 0.1–1.0)
Monocytes Relative: 9 %
Neutro Abs: 9.7 10*3/uL — ABNORMAL HIGH (ref 1.7–7.7)
Neutrophils Relative %: 73 %
Platelets: 481 10*3/uL — ABNORMAL HIGH (ref 150–400)
RBC: 4.4 MIL/uL (ref 3.87–5.11)
RDW: 13.9 % (ref 11.5–15.5)
WBC: 13.1 10*3/uL — ABNORMAL HIGH (ref 4.0–10.5)
nRBC: 0 % (ref 0.0–0.2)

## 2021-08-10 NOTE — Progress Notes (Signed)
Pts notes state she needs spirometry before she wears the CPAP.  She wants to make sure she has everything done before wearing one. ?

## 2021-08-10 NOTE — Progress Notes (Signed)
SATURATION QUALIFICATIONS: (This note is used to comply with regulatory documentation for home oxygen) ? ?Patient Saturations on Room Air at Rest = 86% ? ?Please briefly explain why patient needs home oxygen: ?Pt will be requiring 2L of supplemental oxygen to keep SpO2 >90  ?

## 2021-08-10 NOTE — Care Management Important Message (Signed)
Important Message ? ?Patient Details  ?Name: [name] ?MRN: [id number] ?Date of Birth: 06/14/1940 ? ? ?Medicare Important Message Given:  Yes ? ? ? ? ?Shelda Altes ?08/10/2021, 10:49 AM ?

## 2021-08-10 NOTE — Progress Notes (Signed)
Physical Therapy Treatment ?Patient Details ?Name: [name] ?MRN: [id number] ?DOB: Sep 13, 1940 ?Today's Date: 08/10/2021 ? ? ?History of Present Illness Pt is 81 yo female admitted 08/03/21 with resp failure, shock, and encephalopathy.  Pt intubated 08/03/21-08/06/21.  Heart catherization 08/06/21.  Pt with hx including but not limited to HTN, HLD, CHF (Echo 03/2021 with EF 65-70%, grade 1 DD), CAD (s/p LAD stent 2002), asthma, T2DM, diverticulosis, renal lesion (stable, RCC vs. angiomyolipoma), GERD, fibromyalgia. ? ?  ?PT Comments  ? ? The pt was agreeable to session with focus on progression of mobility as the pt and her family now state they would like to return home with HHPT rather than pursue AIR. The pt continues to present with flat affect and need significantly increased time to process and respond to any questions or directions provided in session. The pt's daughters were present and state this is not the pt's usual demeanor, but that they feel equipped to return home and care for the pt. The pt was able to ambulate ~35 ft in single bout with minG, SpO2 > 93% on 2L. Then completed additional bout after 2 min seated rest. The pt voiced no questions or concerns about d/c home, will continue to benefit from skilled PT to progress endurance, stability, and independence with transfers.  ?   ?Recommendations for follow up therapy are one component of a multi-disciplinary discharge planning process, led by the attending physician.  Recommendations may be updated based on patient status, additional functional criteria and insurance authorization. ? ?Follow Up Recommendations ? Home health PT ?  ?  ?Assistance Recommended at Discharge Frequent or constant Supervision/Assistance  ?Patient can return home with the following A little help with walking and/or transfers;A lot of help with bathing/dressing/bathroom;Assistance with cooking/housework;Direct supervision/assist for medications management;Help with stairs or ramp  for entrance;Assist for transportation ?  ?Equipment Recommendations ? BSC/3in1  ?  ?Recommendations for Other Services   ? ? ?  ?Precautions / Restrictions Precautions ?Precautions: Fall ?Precaution Comments: watch SpO2, on 2L for gait ?Restrictions ?Weight Bearing Restrictions: No  ?  ? ?Mobility ? Bed Mobility ?Overal bed mobility: Needs Assistance ?Bed Mobility: Supine to Sit ?  ?  ?Supine to sit: Min assist, HOB elevated ?  ?  ?General bed mobility comments: minA to initiate movement of LE and to pull up to sitting. pt using bed rail, increased time ?  ? ?Transfers ?Overall transfer level: Needs assistance ?Equipment used: Rolling walker (2 wheels) ?Transfers: Sit to/from Stand ?Sit to Stand: Min guard ?  ?  ?  ?  ?  ?General transfer comment: significantly increased time to rise, cues for hand placement each time. no physical assist given ?  ? ?Ambulation/Gait ?Ambulation/Gait assistance: Min guard ?Gait Distance (Feet): 35 Feet (+ 10 ft) ?Assistive device: Rolling walker (2 wheels) ?Gait Pattern/deviations: Step-to pattern, Shuffle, Trunk flexed ?Gait velocity: reduced ?  ?  ?General Gait Details: pt with slow, shuffling steps, minG for safety and line management. VSS on 2L wigh gait (low of 93%), to 87% on RA and 1L ? ? ? ?  ?Balance Overall balance assessment: Needs assistance ?Sitting-balance support: No upper extremity supported, Single extremity supported, Bilateral upper extremity supported, Feet supported ?Sitting balance-Leahy Scale: Fair ?Sitting balance - Comments: UE support intermittently. ?  ?Standing balance support: Bilateral upper extremity supported, Reliant on assistive device for balance ?Standing balance-Leahy Scale: Poor ?Standing balance comment: Reliant on RW ?  ?  ?  ?  ?  ?  ?  ?  ?  ?  ?  ?  ? ?  ?  Cognition Arousal/Alertness: Awake/alert, Lethargic ?Behavior During Therapy: Flat affect ?Overall Cognitive Status: Impaired/Different from baseline ?Area of Impairment: Problem solving ?   ?  ?  ?  ?  ?  ?  ?  ?  ?  ?  ?  ?  ?  ?Problem Solving: Slow processing, Decreased initiation, Difficulty sequencing, Requires verbal cues ?General Comments: pt with slowed responses and needing increased time for all questions. family present and states this is not the pt's typical demeanor. ?  ?  ? ?  ?Exercises   ? ?  ?General Comments General comments (skin integrity, edema, etc.): SpO2 to low of 87% on RA at rest and with gait, needed 2L to maintain >90% ?  ?  ? ?Pertinent Vitals/Pain Pain Assessment ?Pain Assessment: No/denies pain ?Pain Intervention(s): Monitored during session  ? ? ? ?PT Goals (current goals can now be found in the care plan section) Acute Rehab PT Goals ?Patient Stated Goal: to get better and go home ?PT Goal Formulation: With patient/family ?Time For Goal Achievement: 08/22/21 ?Potential to Achieve Goals: Good ?Progress towards PT goals: Progressing toward goals ? ?  ?Frequency ? ? ? Min 3X/week ? ? ? ?  ?PT Plan Discharge plan needs to be updated  ? ? ?   ?AM-PAC PT "6 Clicks" Mobility   ?Outcome Measure ? Help needed turning from your back to your side while in a flat bed without using bedrails?: A Little ?Help needed moving from lying on your back to sitting on the side of a flat bed without using bedrails?: A Lot ?Help needed moving to and from a bed to a chair (including a wheelchair)?: A Little ?Help needed standing up from a chair using your arms (e.g., wheelchair or bedside chair)?: A Little ?Help needed to walk in hospital room?: A Little ?Help needed climbing 3-5 steps with a railing? : Total ?6 Click Score: 15 ? ?  ?End of Session Equipment Utilized During Treatment: Gait belt;Oxygen ?Activity Tolerance: Patient tolerated treatment well ?Patient left: with call bell/phone within reach;with family/visitor present;in chair ?Nurse Communication: Mobility status ?PT Visit Diagnosis: Other abnormalities of gait and mobility (R26.89);Muscle weakness (generalized)  (M62.81);Unsteadiness on feet (R26.81);Difficulty in walking, not elsewhere classified (R26.2) ?  ? ? ?Time: 7782-4235 ?PT Time Calculation (min) (ACUTE ONLY): 32 min ? ?Charges:  $Therapeutic Exercise: 23-37 mins          ?          ? ?West Carbo, PT, DPT  ? ?Acute Rehabilitation Department ?Pager #: [phone] - [phone] ? ? ?Sandra Cockayne ?08/10/2021, 3:02 PM ? ?

## 2021-08-10 NOTE — Progress Notes (Signed)
?PROGRESS NOTE ? ? ?[name]  UYQ:[id number] DOB: 14-Apr-1941 DOA: 08/03/2021 ?PCP: [name], MD  ?Brief Narrative:  ?81 year old black female ?LAD with BMS 2002-last echo EF 65-70% 03/2021, ulcerative colitis, DM TY 2, renal cyst?  RCC versus angiomyolipoma followed by Dr.Wrenn of urology ?Admitted by critical care medicine with nausea vomiting but then found unresponsive 08/03/2020 required intubation and pressor initiation ?4/5 2100 LKW ?4/6 Unresponsive at home, hypoxic to 40%. BIB EMS to ED, febrile/hypotensive, bradycardic/hypoxic. Intubated, RIJ CVC placed. Levo and Vaso started. Limited Echo with EF 37.9%, new HF.  ?4/6 CT chest shows no pulmonary embolism. Obese patient. Bibasilar and RML infiltrates. RV not enlarged but RA is on CT  ?4/6 CT head is negative, Repeat abdominal CT for cyst evaluation recommended in 3 months. ?4/7 increased sedation for agitation.  ?4/9 extubated.  ?4/10 R/LHC showed pulmonary hypertension ? ?Hospital-Problem based course ? ?Combined shock-septic + cardiogenic-CT chest negative for PE  ?decrease in EF this admission 45-50% with RV failure ?Cath 4/10 shows moderate PAH group 3 ?Cardiology feels patient is preload dependent and they are managing meds ?Current meds = losartan 25 torsemide 20 twice daily atenolol 50 twice daily amlodipine 10, continue Plavix 75 daily ?Weights inaccurate-- minus 4.2 L today, weight is down to 96.3--we will need to ensure that the patient has standing weights performed ?Patient qualifies for oxygen as she desats to 86% without the same ?Possible pneumonia on admission ?completed 5 days cefepime vancomycin ending 4/10--- leukocytosis stable in the 11-13 range with reactive thrombocytosis ?I do not know why the patient has a leukocytosis I do not think it is infectious ?We will follow ?Likely OSA OHSS requiring CPAP at night ?Group 3 pulmonary hypertension ?RT will complete necessary testing including spirometry in a.m. ?She desats and has OHSS  habitus and probably would benefit from obtaining a CPAP machine prior to discharge ?Bradycardia HTN ?Careful with beta-blockade atenolol ?Resolved hyperkalemia ?Potassium low 2.8 initially and now in the 3.4 range-monitor trends periodically ?Chronic left eye ptosis left facial angle droop ?Probably underlying Bell's palsy-this is chronic ?Prior angiomyolipoma versus RCC followed by Dr. Jeffie Pollock ?I will CC Dr. Jeffie Pollock with regards to this finding ?Diverticulosis, chronic pancreatitis ?Continue Creon as well as mesalamine as per home regimen ? ? ?DVT prophylaxis: SCD ?Code Status: Full ?Family Communication:  no family present today ?Disposition:  ?Status is: Inpatient ?Remains inpatient appropriate because:  ? ?Disposition planning ?  ?Consultants:  ?Cardiology ?Pulmonary ? ?Procedures:  ? ?Antimicrobials: Completed as above ? ? ?Subjective: ? ?She is sitting up in bed today looking fair ?When looking at her dry weight she is about 200 pounds in the clinic and today she is 213 ?She still needs several days of diuresis ?She has no chest pain  ?She is mobilizing some --therapy is recommending home health PT with bedside commode and 3 and 1 no family present today ? ? ?Objective: ?Vitals:  ? 08/10/21 0407 08/10/21 0736 08/10/21 0802 08/10/21 1100  ?BP: (!) 152/81 (!) 138/94 (!) 147/90 131/81  ?Pulse: 67 75 61 82  ?Resp: (!) _0 ?Temp: 98.9 ?F (37.2 ?C) 98.1 ?F (36.7 ?C) 98 ?F (36.7 ?C) 97.6 ?F (36.4 ?C)  ?TempSrc: Oral Oral Oral Oral  ?SpO2: 90% 93% 98% 93%  ?Weight: 96.3 kg     ?Height:      ? ? ?Intake/Output Summary (Last 24 hours) at 08/10/2021 1621 ?Last data filed at 08/10/2021 1500 ?Gross per 24 hour  ?Intake 300 ml  ?  Output 1100 ml  ?Net -800 ml  ? ? ?Filed Weights  ? 08/08/21 0431 08/09/21 0428 08/10/21 0407  ?Weight: 99.8 kg 103 kg 96.3 kg  ? ? ?Examination: ? ?Coherent awake ?Mild JVD but difficult to examine secondary to the patient's habitus ?She is on nasal oxygen and I have asked nursing to place her  on regular flow instead of high flow ?She has some mild crackles posterolaterally ?On monitors she is in sinus predominantly ?Trace lower extremity edema ?Abdomen is soft cannot really appreciate hepatosplenomegaly given her habitus there is no hepatojugular reflux on exam ? ?Data Reviewed: personally reviewed  ? ?CBC ?   ?Component Value Date/Time  ? WBC 13.1 (H) 08/10/2021 4975  ? RBC 4.40 08/10/2021 0307  ? HGB 13.0 08/10/2021 0307  ? HGB 12.5 09/08/2019 1134  ? HCT 39.7 08/10/2021 0307  ? HCT 38.0 09/08/2019 1134  ? PLT 481 (H) 08/10/2021 3005  ? PLT 462 (H) 09/08/2019 1134  ? MCV 90.2 08/10/2021 0307  ? MCV 92 09/08/2019 1134  ? MCH 29.5 08/10/2021 0307  ? MCHC 32.7 08/10/2021 0307  ? RDW 13.9 08/10/2021 0307  ? RDW 12.1 09/08/2019 1134  ? LYMPHSABS 1.6 08/10/2021 0307  ? MONOABS 1.1 (H) 08/10/2021 1102  ? EOSABS 0.6 (H) 08/10/2021 1117  ? BASOSABS 0.1 08/10/2021 0307  ? ? ?  Latest Ref Rng & Units 08/10/2021  ?  3:07 AM 08/09/2021  ?  2:59 AM 08/08/2021  ? 11:42 AM  ?CMP  ?Glucose 70 - 99 mg/dL 154   137   191    ?BUN 8 - 23 mg/dL _0 ?Creatinine 0.44 - 1.00 mg/dL 0.49   0.45   0.41    ?Sodium 135 - 145 mmol/L 136   138   137    ?Potassium 3.5 - 5.1 mmol/L 3.4   3.8   4.0    ?Chloride 98 - 111 mmol/L 101   99   101    ?CO2 22 - 32 mmol/L _1 ?Calcium 8.9 - 10.3 mg/dL 8.7   9.1   8.9    ? ? ? ?Radiology Studies: ?No results found. ? ? ?Scheduled Meds: ? amLODipine  10 mg Oral Daily  ? atenolol  50 mg Oral BID  ? atorvastatin  20 mg Oral Daily  ? clopidogrel  75 mg Oral Daily  ? enoxaparin (LOVENOX) injection  40 mg Subcutaneous Q24H  ? ferrous sulfate  325 mg Oral BID  ? gabapentin  300 mg Oral Q12H  ? lidocaine  1-2 patch Transdermal Q24H  ? lipase/protease/amylase  24,000 Units Oral TID AC  ? losartan  25 mg Oral Daily  ? mouth rinse  15 mL Mouth Rinse BID  ? mesalamine  2.4 g Oral BID  ? mometasone-formoterol  2 puff Inhalation BID  ? pantoprazole  40 mg Oral BID  ? sodium chloride flush   3 mL Intravenous Q12H  ? torsemide  20 mg Oral BID  ? ?Continuous Infusions: ? sodium chloride Stopped (08/05/21 0013)  ? sodium chloride    ? ? ? LOS: 7 days  ? ?Time spent: 24 ? ?Nita Sells, MD ?[facility] ?To contact the attending provider between 7A-7P or the covering provider during after hours 7P-7A, please log into the web site [email or web address] and access using universal [place] password for that web site. If you do not have the password,  please call the hospital operator. ? ?08/10/2021, 4:21 PM  ? ? ?

## 2021-08-10 NOTE — TOC CM/SW Note (Addendum)
Spoke to RT, Arbie Cookey and received order for basic spirometry in am. Will complete and results will show in under procedures. Updated [employer] rep, Zach. Jonnie Finner RN3 CCM, Heart Failure TOC CM [phone]  ? ?HF TOC CM spoke to attending and pt may qualify for NIV. [employer] rep, [place] fax completed order. Waiting for RT note on PFT. Jonnie Finner RN3 CCM, Heart Failure TOC CM [phone]  ? ?HF TOC CM contacted [place] for oxygen for home. Will evaluate to see if pt will qualify for Bipap. Pt will need outpt sleep study for CPAP for home. Spoke to pt's son, Gwyndolyn Saxon, she has RW with seat but will need 3n1 bedside commode. Explained she may need outpt sleep study for CPAP. Son verbalized understanding.  Attending updated. Jonnie Finner RN3 CCM, Heart Failure TOC CM [phone]  ?

## 2021-08-11 ENCOUNTER — Inpatient Hospital Stay (HOSPITAL_COMMUNITY): Payer: PPO

## 2021-08-11 DIAGNOSIS — R570 Cardiogenic shock: Secondary | ICD-10-CM | POA: Diagnosis not present

## 2021-08-11 LAB — CBC WITH DIFFERENTIAL/PLATELET
Abs Immature Granulocytes: 0.05 10*3/uL (ref 0.00–0.07)
Basophils Absolute: 0 10*3/uL (ref 0.0–0.1)
Basophils Relative: 0 %
Eosinophils Absolute: 0.6 10*3/uL — ABNORMAL HIGH (ref 0.0–0.5)
Eosinophils Relative: 5 %
HCT: 39.5 % (ref 36.0–46.0)
Hemoglobin: 12.4 g/dL (ref 12.0–15.0)
Immature Granulocytes: 0 %
Lymphocytes Relative: 12 %
Lymphs Abs: 1.6 10*3/uL (ref 0.7–4.0)
MCH: 28.7 pg (ref 26.0–34.0)
MCHC: 31.4 g/dL (ref 30.0–36.0)
MCV: 91.4 fL (ref 80.0–100.0)
Monocytes Absolute: 1.2 10*3/uL — ABNORMAL HIGH (ref 0.1–1.0)
Monocytes Relative: 9 %
Neutro Abs: 9.3 10*3/uL — ABNORMAL HIGH (ref 1.7–7.7)
Neutrophils Relative %: 74 %
Platelets: 413 10*3/uL — ABNORMAL HIGH (ref 150–400)
RBC: 4.32 MIL/uL (ref 3.87–5.11)
RDW: 13.9 % (ref 11.5–15.5)
WBC: 12.7 10*3/uL — ABNORMAL HIGH (ref 4.0–10.5)
nRBC: 0 % (ref 0.0–0.2)

## 2021-08-11 LAB — GLUCOSE, CAPILLARY
Glucose-Capillary: 162 mg/dL — ABNORMAL HIGH (ref 70–99)
Glucose-Capillary: 168 mg/dL — ABNORMAL HIGH (ref 70–99)
Glucose-Capillary: 171 mg/dL — ABNORMAL HIGH (ref 70–99)
Glucose-Capillary: 177 mg/dL — ABNORMAL HIGH (ref 70–99)

## 2021-08-11 LAB — SPIROMETRY WITH GRAPH
FEF 25-75 Pre: 0.53 L/sec
FEF2575-%Pred-Pre: 46 %
FEV1-%Pred-Pre: 65 %
FEV1-Pre: 0.87 L
FEV1FVC-%Pred-Pre: 101 %
FEV6-%Pred-Pre: 66 %
FEV6-Pre: 1.1 L
FEV6FVC-%Pred-Pre: 102 %
FVC-%Pred-Pre: 65 %
FVC-Pre: 1.14 L
Pre FEV1/FVC ratio: 77 %
Pre FEV6/FVC Ratio: 97 %

## 2021-08-11 LAB — BASIC METABOLIC PANEL
Anion gap: 12 (ref 5–15)
BUN: 17 mg/dL (ref 8–23)
CO2: 24 mmol/L (ref 22–32)
Calcium: 8.7 mg/dL — ABNORMAL LOW (ref 8.9–10.3)
Chloride: 98 mmol/L (ref 98–111)
Creatinine, Ser: 0.55 mg/dL (ref 0.44–1.00)
GFR, Estimated: >60 mL/min (ref >60)
Glucose, Bld: 198 mg/dL — ABNORMAL HIGH (ref 70–99)
Potassium: 3.4 mmol/L — ABNORMAL LOW (ref 3.5–5.1)
Sodium: 134 mmol/L — ABNORMAL LOW (ref 135–145)

## 2021-08-11 LAB — MAGNESIUM: Magnesium: 1.5 mg/dL — ABNORMAL LOW (ref 1.7–2.4)

## 2021-08-11 MED ORDER — METOPROLOL SUCCINATE ER 100 MG PO TB24
100.0000 mg | ORAL_TABLET | Freq: Every day | ORAL | Status: DC
Start: 2021-08-12 — End: 2021-08-11

## 2021-08-11 MED ORDER — METOPROLOL SUCCINATE ER 100 MG PO TB24: 100.0000 mg | ORAL_TABLET | Freq: Every day | ORAL | 11 refills | Status: DC

## 2021-08-11 MED ORDER — ATENOLOL 50 MG PO TABS: 50.0000 mg | ORAL_TABLET | Freq: Two times a day (BID) | ORAL | 0 refills | Status: DC

## 2021-08-11 MED ORDER — GABAPENTIN 300 MG PO CAPS: 300.0000 mg | ORAL_CAPSULE | Freq: Two times a day (BID) | ORAL | 0 refills | Status: DC

## 2021-08-11 MED ORDER — LOSARTAN POTASSIUM 25 MG PO TABS: 25.0000 mg | ORAL_TABLET | Freq: Every day | ORAL | 0 refills | Status: DC

## 2021-08-11 MED ORDER — MAGNESIUM SULFATE 4 GM/100ML IV SOLN
4.0000 g | Freq: Once | INTRAVENOUS | Status: AC
  Administered 2021-08-11: 4 g via INTRAVENOUS
  Filled 2021-08-11: qty 100

## 2021-08-11 MED ORDER — POTASSIUM CHLORIDE CRYS ER 20 MEQ PO TBCR
40.0000 meq | EXTENDED_RELEASE_TABLET | ORAL | Status: DC
  Administered 2021-08-11: 40 meq via ORAL
  Filled 2021-08-11: qty 2

## 2021-08-11 MED ORDER — POTASSIUM CHLORIDE CRYS ER 20 MEQ PO TBCR
20.0000 meq | EXTENDED_RELEASE_TABLET | Freq: Every day | ORAL | Status: DC
  Administered 2021-08-11: 20 meq via ORAL
  Filled 2021-08-11: qty 1

## 2021-08-11 MED ORDER — TORSEMIDE 40 MG PO TABS: 40.0000 mg | ORAL_TABLET | Freq: Every day | ORAL | 2 refills | Status: DC

## 2021-08-11 NOTE — TOC Initial Note (Signed)
Transition of Care (TOC) - Initial/Assessment Note  ? ? ?Patient Details  ?Name: [name] ?MRN: [id number] ?Date of Birth: 1941/01/11 ? ?Transition of Care [facility]) CM/SW Contact:    ?Marcheta Grammes Rexene Alberts, RN ?Phone Number: [phone] ?08/11/2021, 2:44 PM ? ?Clinical Narrative:                 ?HF TOC CM spoke to pt and son at bedside. Pt lives in home with son and DIL. Her DME was delivered to room, 3n1 bsc and oxygen. Pt has RW with seat at home. [employer] had to bring another portable because family took her portable home. Updated family that [place], Thedore Mins will submit order for NIV/Trilogy to [facility], explained auth process may take a few days. Explained he will need to follow up with pt's PCP or cardiologist if not approved to determine next step. [facility] rep, Anderson [place] to make aware of dc home today.  ? ?Expected Discharge Plan: [place] ?Barriers to Discharge: Continued Medical Work up ? ? ?Patient Goals and CMS Choice ?Patient states their goals for this hospitalization and ongoing recovery are:: To return home ?CMS Medicare.gov Compare Post Acute Care list provided to:: Patient ?Choice offered to / list presented to : Patient, Adult Children (Son, Gwyndolyn Saxon) ? ?Expected Discharge Plan and Services ?Expected Discharge Plan: [place] ?  ?  ?  ?  ?Expected Discharge Date: 08/11/21               ?  ?  ?  ?  ?  ?HH Arranged: PT, OT ?[place] Agency: [facility] ?Date HH Agency Contacted: 08/09/21 ?Time [place]: 1500 ?Representative spoke with at [place]: Delsa Sale ? ?Prior Living Arrangements/Services ?  ?  ?Patient language and need for interpreter reviewed:: Yes ?Do you feel safe going back to the place where you live?: Yes      ?Need for Family Participation in Patient Care: Yes (Comment) ?Care giver support system in place?: Yes (comment) ?  ?Criminal Activity/Legal Involvement Pertinent to Current Situation/Hospitalization: No -  Comment as needed ? ?Activities of Daily Living ?  ?  ? ?Permission Sought/Granted ?Permission sought to share information with : Case Manager, Customer service manager, Family Supports ?Permission granted to share information with : Yes, Verbal Permission Granted ? Share Information with NAME: Delsa Sale ? Permission granted to share info w AGENCY: [facility] ?   ?   ? ?Emotional Assessment ?Appearance:: Appears stated age ?Attitude/Demeanor/Rapport: Engaged, Gracious ?Affect (typically observed): Accepting, Calm, Appropriate, Hopeful ?Orientation: : Oriented to Self, Oriented to Place, Oriented to  Time, Oriented to Situation ?Alcohol / Substance Use: Not Applicable ?Psych Involvement: No (comment) ? ?Admission diagnosis:  Hyperkalemia [E87.5] ?Stupor [R40.1] ?Shock ([place]) [R57.9] ?Acute respiratory failure with hypoxia ([place]) [J96.01] ?AKI (acute kidney injury) ([place]) [N17.9] ?Septic shock ([place]) [A41.9, R65.21] ?Patient Active Problem List  ? Diagnosis Date Noted  ? Acute respiratory failure with hypoxia ([place]) 08/06/2021  ? Community acquired pneumonia of right lung 08/06/2021  ? Cor pulmonale, acute ([place]) 08/06/2021  ? Acute HFrEF (heart failure with reduced ejection fraction) ([place]) 08/06/2021  ? OSA (obstructive sleep apnea) 08/06/2021  ? Cervical radicular pain 08/06/2021  ? Cardiogenic shock ([place]) 08/03/2021  ? Leg cramps 07/18/2020  ? Low back pain 10/21/2018  ? Fibromyalgia 01/03/2017  ? Ulcerative colitis ([place]) 11/25/2011  ? Coronary artery disease involving native coronary artery of native heart with angina pectoris ([place])   ?  Bronchiectasis ([place])   ? Essential hypertension   ? Vitamin D deficiency 12/03/2009  ? Depression 08/10/2008  ? PULMONARY NODULE 07/15/2008  ? NEOPLASM, KIDNEY 03/16/2008  ? STRESS INCONTINENCE 03/16/2008  ? COLONIC POLYPS 07/19/2007  ? Diabetes mellitus with coincident hypertension ([place]) 07/19/2007  ? Morbid obesity ([place]) 07/19/2007  ? Asthma 07/19/2007  ? DIVERTICULOSIS OF COLON  07/19/2007  ? ANXIETY 03/19/2007  ? GERD 03/19/2007  ? Osteoarthritis 03/19/2007  ? FIBROMYALGIA 03/19/2007  ? ?PCP:  [name], MD ?Pharmacy:   ?[id number] [place] [place] ?[address] [id number]?Phone: [phone]: [phone]? ?[id number] [address]Phone: [phone]: [phone] ? ? ? ?Social Determinants of Health (SDOH) Interventions ?  ? ?Readmission Risk Interventions ?   ? View : No data to display.  ?  ?  ?  ? ? ? ?

## 2021-08-11 NOTE — Discharge Summary (Addendum)
?Physician Discharge Summary ?  ?Patient: [name] MRN: [id number] DOB: Jul 15, 1940  ?Admit date:     08/03/2021  ?Discharge date: 08/11/21  ?Discharge Physician: Nita Sells  ? ?PCP: [name], MD  ? ?Recommendations at discharge:  ? ?Needs Chem-12 CBC and magnesium in about 1 week at PCP office---Was given Magnesium 4 g prior to d/c 2/2 PAC's ?Will need initiation of CPAP this admission--we have filled out forms for the same and patient should follow-up with primary care physician to ensure that she is titrated on this and patient will discharge home on oxygen 3 L ?We will order home health PT on discharge ?Will need chest x-ray in about 1 month-she was treated this hospitalization for possible pneumonia in addition to cardiogenic shock and received 5 days of treatment-her leukocytosis may need to be followed I do not think this is driven by infection given her improvement ?Patient will need interval follow-[facility] Dr. Jeffie Pollock and for angiomyolipoma versus RCC and we need scanning in the outpatient setting ? ?Discharge Diagnoses: ?Principal Problem: ?  Cardiogenic shock ([place]) ?Active Problems: ?  Diabetes mellitus with coincident hypertension ([place]) ?  Acute respiratory failure with hypoxia ([place]) ?  Community acquired pneumonia of right lung ?  Cor pulmonale, acute ([place]) ?  Acute HFrEF (heart failure with reduced ejection fraction) ([place]) ?  OSA (obstructive sleep apnea) ?  Cervical radicular pain ? ?Resolved Problems: ?  * No resolved hospital problems. * ? ?Hospital Course: ?81 year old black female ?LAD with BMS 2002-last echo EF 65-70% 03/2021, ulcerative colitis, DM TY 2, renal cyst?  RCC versus angiomyolipoma followed by Dr.Wrenn of urology ?Admitted by critical care medicine with nausea vomiting but then found unresponsive 08/03/2020 required intubation and pressor initiation ?4/5 2100 LKW ?4/6 Unresponsive at home, hypoxic to 40%. BIB EMS to ED, febrile/hypotensive,  bradycardic/hypoxic. Intubated, RIJ CVC placed. Levo and Vaso started. Limited Echo with EF 37.9%, new HF.  ?4/6 CT chest shows no pulmonary embolism. Obese patient. Bibasilar and RML infiltrates. RV not enlarged but RA is on CT  ?4/6 CT head is negative, Repeat abdominal CT for cyst evaluation recommended in 3 months. ?4/7 increased sedation for agitation.  ?4/9 extubated.  ?4/10 R/LHC showed pulmonary hypertension ?  ?Hospital-Problem based course ?  ?Combined shock-septic + cardiogenic-CT chest negative for PE  ?decrease in EF this admission 45-50% with RV failure ?Cath 4/10 shows moderate PAH group 3 ?Cardiology feels patient is preload dependent and they are managing meds ?Current meds = losartan 25 torsemide 40 daily,  ?atenolol 50 twice daily was changed this admit to Torpol XL 100 qd ?amlodipine 10, continue Plavix 75 daily ?Weights inaccurate-- minus 6.5 L today, weight is down to 95 ?Patient qualifies for oxygen as she desats to 86% without the same ?Patient is insistent on going home I have discussed with heart failure team they have discussed med changes as above and patient will get TOC visit ?Possible pneumonia on admission ?completed 5 days cefepime vancomycin ending 4/10--- leukocytosis stable in the 11-13 range with reactive thrombocytosis ?I do not know why the patient has a leukocytosis and she does not seem infectious despite the same- ?She will need to repeat chest x-ray in about 1 month's time-I am not convinced that she has pneumonia given her improvement and she has received the standard duration of 5 days treatment ?Likely OSA OHSS requiring CPAP at night ?Group 3 pulmonary hypertension ?RT will complete necessary testing including spirometry in a.m. ?She desats and  has OHSS habitus and probably would benefit from obtaining a CPAP machine if able to be performed at this hospitalization ?I have mentioned this clearly to family that she will need follow-up with Dr. Sarajane Jews to ensure that these  things are done and they understand the same ?Bradycardia HTN ?Careful with beta-blockade atenolol-has some PVCs here and there but overall heart rate is controlled ?Resolved hyperkalemia ?Potassium low 2.8 initially and now in the 3.4 range-monitor trends periodically in the outpatient setting and will need labs in about 1 week ?Chronic left eye ptosis left facial angle droop ?Probably underlying Bell's palsy-this is chronic ?Prior angiomyolipoma versus RCC followed by Dr. Jeffie Pollock ?I will CC Dr. Jeffie Pollock with regards to this finding ?Diverticulosis, chronic pancreatitis ?Continue Creon as well as mesalamine as per home regimen ?DM ty ii ?Resumed on metformin, januvia and glipizide on d/c and will continue Gabapentin as well--careful with these meds---check Bmet and ensure Creat stable and mentation fair on Gabapentin ?  ? ? ?Consultants: Cardiology ?Procedures performed: Echo's ?Disposition: Home health ?Diet recommendation:  ?Discharge Diet Orders (From admission, onward)  ? ?  Start     Ordered  ? 08/11/21 0000  Diet - low sodium heart healthy       ? 08/11/21 1039  ? ?  ?  ? ?  ? ?Cardiac and Carb modified diet ?DISCHARGE MEDICATION: ?Allergies as of 08/11/2021   ? ?   Reactions  ? Doxycycline Other (See Comments)  ? Unsteady gait  ? Aspirin Nausea And Vomiting  ? Azithromycin Other (See Comments)  ? REACTION: pt states "ZPak doesn't work"  ? Caffeine Nausea And Vomiting  ? Ciprofloxacin Nausea And Vomiting  ? Tolerates levaquin  ? Codeine Nausea And Vomiting  ? Oxycodone Other (See Comments)  ? Pt stated, "upsets my stomach" (11/19/17)  ? Sulfamethizole Other (See Comments)  ? Unknown reaction  ? Tramadol Hcl Other (See Comments)  ? REACTION: pt states "spaced-out"  ? ?  ? ?  ?Medication List  ?  ? ?STOP taking these medications   ? ?atenolol 50 MG tablet ?Commonly known as: TENORMIN ?  ?diphenoxylate-atropine 2.5-0.025 MG tablet ?Commonly known as: LOMOTIL ?  ?furosemide 40 MG tablet ?Commonly known as: LASIX ?   ?gabapentin 600 MG tablet ?Commonly known as: NEURONTIN ?Replaced by: gabapentin 300 MG capsule ?  ?glycopyrrolate 1 MG tablet ?Commonly known as: ROBINUL ?  ?isosorbide mononitrate 60 MG 24 hr tablet ?Commonly known as: IMDUR ?  ?Klor-Con M20 20 MEQ tablet ?Generic drug: potassium chloride SA ?  ?nabumetone 750 MG tablet ?Commonly known as: RELAFEN ?  ? ?  ? ?TAKE these medications   ? ?acetaminophen 500 MG tablet ?Commonly known as: TYLENOL ?Take 500 mg by mouth every 6 (six) hours as needed for headache (pain). ?  ?albuterol 108 (90 Base) MCG/ACT inhaler ?Commonly known as: Ventolin HFA ?Inhale 2 puffs into the lungs every 4 (four) hours as needed for wheezing or shortness of breath. ?  ?albuterol (2.5 MG/3ML) 0.083% nebulizer solution ?Commonly known as: PROVENTIL ?Take 3 mLs (2.5 mg total) by nebulization every 4 (four) hours as needed for wheezing or shortness of breath. ?  ?ALPRAZolam 0.5 MG tablet ?Commonly known as: Duanne Moron ?Take 1 tablet (0.5 mg total) by mouth 3 (three) times daily as needed. ?What changed: reasons to take this ?  ?amLODipine 10 MG tablet ?Commonly known as: NORVASC ?Take 1 tablet (10 mg total) by mouth daily. ?  ?atorvastatin 20 MG tablet ?Commonly known as: LIPITOR ?  TAKE 1 TABLET (20 MG TOTAL) BY MOUTH DAILY. PLEASE KEEP UPCOMING APPT FOR FUTURE REFILLS. ?  ?azelastine 0.1 % nasal spray ?Commonly known as: ASTELIN ?Place 1 spray into both nostrils at bedtime. ?  ?budesonide-formoterol 160-4.5 MCG/ACT inhaler ?Commonly known as: SYMBICORT ?Inhale 2 puffs into the lungs 2 (two) times daily. ?  ?clopidogrel 75 MG tablet ?Commonly known as: PLAVIX ?Take 1 tablet (75 mg total) by mouth daily. ?  ?diclofenac sodium 1 % Gel ?Commonly known as: VOLTAREN ?Apply 2 g topically 4 (four) times daily as needed (Pain). ?  ?fluticasone 50 MCG/ACT nasal spray ?Commonly known as: FLONASE ?PLACE 2 SPRAYS IN EACH NOSTRIL AT BEDTIME ?What changed: See the new instructions. ?  ?gabapentin 300 MG  capsule ?Commonly known as: NEURONTIN ?Take 1 capsule (300 mg total) by mouth 2 (two) times daily. ?Replaces: gabapentin 600 MG tablet ?  ?glimepiride 1 MG tablet ?Commonly known as: AMARYL ?Take 2 tablets (2 mg total) by mouth daily

## 2021-08-11 NOTE — TOC CM/SW Note (Signed)
[name] presents with morbid obesity that is causing thoracic restriction.  The use of the NIV will treat this patient's ventilatory defects and can reduce risk of exacerbations and future hospitalizations when used at night and during the day. All alternate devices [phone] and [id number]) have been considered and ruled out as volume requirements are not met by BiLevel devices.  Interruption or failure to provide NIV would quickly lead to exacerbation of the patient's condition, hospital admission, and likely harm to the patient.  Continued use is preferred.  Patient is able to protect their airways and clear secretions on their own. ? ?

## 2021-08-11 NOTE — Progress Notes (Addendum)
? ? Advanced Heart Failure Rounding Note ? ?PCP-Cardiologist: Candee Furbish, MD  ? ?Subjective:   ? ?R/LHC 4/10 w/ mild to moderate non-obstructive CAD with 90% lesion in apical LAD (medical therapy) + mod PAH and preserved output. PWP 15, RAP 8  ? ?Scr stable at 0.49 yesterday, K 3.4. ? ?AM labs pending. ? ?On 20 mg Torsemide BID.  ? ?Ambulated 35 feet yesterday with PT. Reports energy improving slowly. Denies dyspnea, orthopnea and PND. ? ?Tele: SR 80s with frequent PACs, ? Short atrial runs ? ?RHC hemodynamics ? ?Ao = 167/84 (118) ?LV = 152/11 ?RA =  8 ?RV = 64/11 ?PA = 68/26 (45) ?PCW = 15 ?Fick cardiac output/index = 6.0/3.0 ?PVR = 5.0 WU ?FA sat = 92% ?PA sat = 66%, 67% ? ? ?Objective:   ?Weight Range: ?95.2 kg ?Body mass index is 39.66 kg/m?.  ? ?Vital Signs:   ?Temp:  [97.6 ?F (36.4 ?C)-98.1 ?F (36.7 ?C)] 98.1 ?F (36.7 ?C) (04/14 0754) ?Pulse Rate:  [61-82] 71 (04/14 0905) ?Resp:  [16-20] 16 (04/14 0754) ?BP: (119-146)/(64-96) 146/96 (04/14 0905) ?SpO2:  [90 %-97 %] 92 % (04/14 0754) ?Weight:  [95.2 kg] 95.2 kg (04/14 0500) ?Last BM Date : 08/10/21 ? ?Weight change: ?Filed Weights  ? 08/09/21 0428 08/10/21 0407 08/11/21 0500  ?Weight: 103 kg 96.3 kg 95.2 kg  ? ? ?Intake/Output:  ? ?Intake/Output Summary (Last 24 hours) at 08/11/2021 1052 ?Last data filed at 08/11/2021 0410 ?Gross per 24 hour  ?Intake 360 ml  ?Output 700 ml  ?Net -340 ml  ? ? ?Physical Exam  ?  ?General:  Sitting up in chair. No distress. ?HEENT: normal ?Neck: supple. JVP difficult but does not appear elevated. Carotids 2+ bilat; no bruits.  ?Cor: PMI nondisplaced. Regular rate & rhythm with ectopy. No rubs, gallops or murmurs. ?Lungs: clear ?Abdomen: obese, soft, nontender, nondistended.  ?Extremities: no cyanosis, clubbing, rash, trace edema ?Neuro: alert & orientedx3, cranial nerves grossly intact. moves all 4 extremities w/o difficulty. Affect pleasant ? ? ? ?Telemetry  ?SR with PACs, 70s ? ? ?Labs  ?  ?CBC ?Recent Labs  ?  08/10/21 ?0307  08/11/21 ?0234  ?WBC 13.1* 12.7*  ?NEUTROABS 9.7* 9.3*  ?HGB 13.0 12.4  ?HCT 39.7 39.5  ?MCV 90.2 91.4  ?PLT 481* 413*  ? ?Basic Metabolic Panel ?Recent Labs  ?  08/09/21 ?0259 08/10/21 ?0307  ?NA 138 136  ?K 3.8 3.4*  ?CL 99 101  ?CO2 29 29  ?GLUCOSE 137* 154*  ?BUN 10 14  ?CREATININE 0.45 0.49  ?CALCIUM 9.1 8.7*  ?PHOS  --  4.2  ? ?Liver Function Tests ?Recent Labs  ?  08/10/21 ?0307  ?ALBUMIN 3.3*  ? ? ?No results for input(s): LIPASE, AMYLASE in the last 72 hours. ?Cardiac Enzymes ?No results for input(s): CKTOTAL, CKMB, CKMBINDEX, TROPONINI in the last 72 hours. ? ?BNP: ?BNP (last 3 results) ?Recent Labs  ?  08/03/21 ?1035  ?BNP 304.1*  ? ? ?ProBNP (last 3 results) ?No results for input(s): PROBNP in the last 8760 hours. ? ? ?D-Dimer ?No results for input(s): DDIMER in the last 72 hours. ?Hemoglobin A1C ?No results for input(s): HGBA1C in the last 72 hours. ?Fasting Lipid Panel ?No results for input(s): CHOL, HDL, LDLCALC, TRIG, CHOLHDL, LDLDIRECT in the last 72 hours. ?Thyroid Function Tests ?No results for input(s): TSH, T4TOTAL, T3FREE, THYROIDAB in the last 72 hours. ? ?Invalid input(s): FREET3 ? ?Other results: ? ? ?Imaging  ? ? ?No results found. ? ? ?  Medications:   ? ? ?Scheduled Medications: ? amLODipine  10 mg Oral Daily  ? atenolol  50 mg Oral BID  ? atorvastatin  20 mg Oral Daily  ? clopidogrel  75 mg Oral Daily  ? enoxaparin (LOVENOX) injection  40 mg Subcutaneous Q24H  ? ferrous sulfate  325 mg Oral BID  ? gabapentin  300 mg Oral Q12H  ? lidocaine  1-2 patch Transdermal Q24H  ? lipase/protease/amylase  24,000 Units Oral TID AC  ? losartan  25 mg Oral Daily  ? mouth rinse  15 mL Mouth Rinse BID  ? mesalamine  2.4 g Oral BID  ? mometasone-formoterol  2 puff Inhalation BID  ? pantoprazole  40 mg Oral BID  ? sodium chloride flush  3 mL Intravenous Q12H  ? torsemide  20 mg Oral BID  ? ? ?Infusions: ? sodium chloride Stopped (08/05/21 0013)  ? sodium chloride    ? ? ?PRN Medications: ?sodium chloride,  acetaminophen, ALPRAZolam, HYDROcodone-acetaminophen, lipase/protease/amylase, olopatadine, ondansetron (ZOFRAN) IV, phenol, sodium chloride flush ? ? ? ?Patient Profile  ? ?81 y/o woman with DM2, UC, obesity, HFPEF, CAD with remote LAD stents, fibromyalgia admitted with altered mental status and respiratory arrest. Found to have lactic acidosis and AKI. WBC 14K. Echo EF 45-50% with dilated and hypokinetic RV. CT no PE. +Evidence of significant PH. Developed junctional bradycardia in setting of hyperkalemia w/ k > 7. Admitted to CCU.  ? ?Assessment/Plan  ? ?1. Shock , unclear etiology. ? Septic +/- cardiogenic ?- Hypotensive on arrival. Placed on Norepi and given IV fluids.  ?- CTA negative for PE.  ?- Echo  45-50% RV dilated , septal flattening. Concern for RV failure/pulmonary HTN. Suspect preload dependent.  ?- Suspect due to PNA in setting of underlying PAH/cor pulmonale. Completed Abx ?- Resolved, hemodynamically stable off pressors  ? ?2. Acute Hypoxic Respiratory Failure with RML PNA ?- Extubated 4/9 ?- Completed Abx  ?- CXR 4/10 w/ improved bilateral lung volumes and ventilation ?- O2 requirements improving, down to 2L [place]. Will discharge with home O2. ?- CCM has s/o  ? ?3. Pulmonary HTN -> cor pulmonale ?- Marked RV strain on echo  ?- RHC c/w Moderate PAH, Likely Group 3 PAH ?- will need eval for OSA/OHS, patient has refused CPAP at night while here ?- R/L filling pressures not markedly high on cath, now on po Torsemide 20 mg BID. Volume looks good on exam. Will likely need K supplement at discharge. BMET today. ?- Continue losartan 25 mg daily. ?  ?4 Bradycardia  ?- Due to hyperkalemia  ?- Resolved ?  ?5. Hyperkalemia  ?- K 7.9 on admit. Given calcium gluconate + Lokelma  ?- K 3.4 yesterday. BMET today. Will likely need supplement with diuretic at discahrge ?  ?6. H/O CAD ?- BMS prox LAD with 3 subsequent caths showing patent stent ?- HS Trop 131>136 ?- No s/s angina ?- LHC this admit w/ mild to moderate  non-obstructive CAD with 90% lesion in apical LAD ?- medical management ?  ?7. DMII ?- per IM  ? ?8. Hypertension ?- On amlodipine 10 mg daily + atenolol 50 mg daily and losartan 25 mg daily. ? ?9. PACs/? Short atrial runs ?- Untreated OSA likely contributing ?- On atenolol 50 mg BID, will switch to metoprolol xl 100 mg daily ? ? ?Okay for discharge from HF perspective ? ?Has f/u scheduled ? ?Meds: ?-Torsemide 20 mg BID ?-Metoprolol xl 100 mg daily ?-Losartan 25 mg daily ?-Amlodipine 10 mg  daily ?-Plavix 75 mg daily ?-Atorvastatin 20 mg daily ?-Will likely need KCL 20 mEq daily depending on results of BMET ? ?Length of Stay: 8 ? ?FINCH, LINDSAY N, PA-C  ?08/11/2021, 10:52 AM ? ?Advanced Heart Failure Team ?Pager [phone] (M-F; 7a - 5p)  ?Please contact [facility] for night-coverage after hours (5p -7a ) and weekends on [email or web address] ? ? ?Patient seen and examined with the above-signed Advanced Practice Provider and/or Housestaff. I personally reviewed laboratory data, imaging studies and relevant notes. I independently examined the patient and formulated the important aspects of the plan. I have edited the note to reflect any of my changes or salient points. I have personally discussed the plan with the patient and/or family. ? ?Eager to go home. Denies CP or SOB/  ? ?General:  Sitting in chair . No resp difficulty ?HEENT: normal ?Neck: supple. no JVD. Carotids 2+ bilat; no bruits. No lymphadenopathy or thryomegaly appreciated. ?Cor: PMI nondisplaced. Regular rate & rhythm. No rubs, gallops or murmurs. ?Lungs: clear ?Abdomen: obese soft, nontender, nondistended. No hepatosplenomegaly. No bruits or masses. Good bowel sounds. ?Extremities: no cyanosis, clubbing, rash, edema ?Neuro: alert & orientedx3, cranial nerves grossly intact. moves all 4 extremities w/o difficulty. Affect pleasant ? ?Volume status looks ok. [place] for d/c. Continue torsemide 40 daily. Will need BIPAP and weight loss. If has residual PAH can try  sildenafil.  ? ?Glori Bickers, MD  ?3:34 PM ? ? ? ? ? ?

## 2021-08-14 ENCOUNTER — Telehealth: Payer: Self-pay | Admitting: Gastroenterology

## 2021-08-14 ENCOUNTER — Telehealth: Payer: Self-pay

## 2021-08-14 DIAGNOSIS — Z9981 Dependence on supplemental oxygen: Secondary | ICD-10-CM | POA: Diagnosis not present

## 2021-08-14 DIAGNOSIS — G4733 Obstructive sleep apnea (adult) (pediatric): Secondary | ICD-10-CM | POA: Diagnosis not present

## 2021-08-14 DIAGNOSIS — Z8679 Personal history of other diseases of the circulatory system: Secondary | ICD-10-CM | POA: Diagnosis not present

## 2021-08-14 DIAGNOSIS — I509 Heart failure, unspecified: Secondary | ICD-10-CM | POA: Diagnosis not present

## 2021-08-14 NOTE — Telephone Encounter (Signed)
Transition Care Management Follow-up Telephone Call ?Date of discharge and from where: [facility] 08-11-21 Dx: Cardiogenic shock ?How have you been since you were released from the hospital? Doing good  ?Any questions or concerns? No ? ?Items Reviewed: ?Did the pt receive and understand the discharge instructions provided? Yes  ?Medications obtained and verified? Yes  ?Other? No  ?Any new allergies since your discharge? No  ?Dietary orders reviewed? Yes ?Do you have support at home? Yes  ? ?Home Care and Equipment/Supplies: ?Were home health services ordered? Yes PT ?If so, what is the name of the agency? [facility]   ?Has the agency set up a time to come to the patient's home? no ?Were any new equipment or medical supplies ordered?  Yes: oxygen ?What [facility] the [facility] of the medical supply agency? [facility]  ?Were you able to get the supplies/equipment? yes ?Do you have any questions related to the use of the equipment or supplies? No ? ?Functional Questionnaire: (I = Independent and D = Dependent) ?ADLs: D ? ?Bathing/Dressing- D ? ?Meal Prep- D ? ?Eating- I ? ?Maintaining continence- I ? ?Transferring/Ambulation- Selena Lesser  ? ?Managing Meds- D ? ?Follow up appointments reviewed: ? ?PCP Hospital f/u appt confirmed? Yes  Scheduled to see Dr Sarajane Jews on 08-21-21 @ 1pm. ?[facility] f/u appt confirmed? Yes  Scheduled to see heart failure clinic on 08-16-21 @ 2pm. ?Are transportation arrangements needed? No  ?If their condition worsens, is the pt aware to call PCP or go to the Emergency Dept.? Yes ?Was the patient provided with contact information for the PCP's office or ED? Yes ?Was to pt encouraged to call back with questions or concerns? Yes  ?

## 2021-08-14 NOTE — Telephone Encounter (Signed)
Patient son called regarding medications. Per son, patient was seen at the ED 08/03/21 and was taken off some medications prescribed by Dr. Fuller Plan. Patient son has questions about that. Please advise.  ?

## 2021-08-14 NOTE — Telephone Encounter (Signed)
Left message on machine to call back  ?

## 2021-08-15 DIAGNOSIS — J189 Pneumonia, unspecified organism: Secondary | ICD-10-CM | POA: Diagnosis not present

## 2021-08-15 DIAGNOSIS — J9601 Acute respiratory failure with hypoxia: Secondary | ICD-10-CM | POA: Diagnosis not present

## 2021-08-15 DIAGNOSIS — I2609 Other pulmonary embolism with acute cor pulmonale: Secondary | ICD-10-CM | POA: Diagnosis not present

## 2021-08-15 NOTE — Telephone Encounter (Signed)
Pt's called back son didn't get a message with a callback request. Is requesting a call regarding this issue at [phone] can leave a message if no answer.  ?

## 2021-08-15 NOTE — Progress Notes (Addendum)
? ?ADVANCED HF CLINIC CONSULT NOTE ? ? ?Primary Care: [name], MD ?Primary Cardiologist: Dr. Marlou Porch ?HF Cardiologist: Dr. Haroldine Laws ? ?HPI: ?[name] is a 81 y.o. with a h/o ulcerative colitis, CAD BMS prox LAD with 3 subsequent cath sshowing patent stent, arthritis, HFpEF, DMII, and fibromyalgia. Intolerant aspirin.  ?  ?Found unresponsive by family, EMS called and intubated in ED. Hypotensive and bradycardiac. Started on NE, IVF and atropine.  CXR showed RML PNA, started on IV abx. Admitted to ICU, POCUS by Dr Haroldine Laws --->  Echo LV 55%. RV moderately reduced. Concern for sepsis +/- CGS, PNA in setting of underlying PAH/cor pulmonale. Underwent R/LHC showing mild to moderate non-obstructive CAD with 90% lesion in apical LAD, normal LV function and moderate PAH with preserved output. Needs eval of OSA/OHS. Pressors weaned off and patient extubated. Patient had atrial runs/PACs, and metoprolol switched to atenolol. Discharged home on 3L oxygen, weight 209 lbs. ? ?Today she returns for post hospital HF follow up with her son. Overall feeling tired and weak. Walks with a rollator around the house but not very active. She had episode of atypical chest pain at night, resolved spontaneously. No significant SOB with walking. Denies palpitations, dizziness, edema, or PND/Orthopnea. Appetite ok. No fever or chills. She is not weighing at home. Taking all medications. [place] agency is [facility]. Lives with son and DIL. Trying to wear 2L oxygen at night. PT starts tomorrow. ? ?Cardiac Studies: ?- Ltd Echo (4/23): EF 35-40%, LV moderately down with global LV HK, moderate LVH moderate to severe TR ? ?- R/LHC (4/23): ?  Dist LAD lesion is 90% stenosed. ?  Prox RCA lesion is 30% stenosed. ?  Dist RCA lesion is 30% stenosed. ?  Prox LAD to Mid LAD lesion is 30% stenosed. ?  The left ventricular ejection fraction is 55-65% by visual estimate. ?  ?Ao = 167/84 (118) ?LV = 152/11 ?RA =  8 ?RV = 64/11 ?PA = 68/26 (45) ?PCW =  15 ?Fick cardiac output/index = 6.0/3.0 ?PVR = 5.0 WU ?FA sat = 92% ?PA sat = 66%, 67% ?  ?1. Predominantly mild to moderate non-obstructive CAD with 90% lesion in apical LAD ?2. Normal LV function ?3. Moderate PAH ? ?Review of Systems: [y] = yes, _0  = no  ? ?General: Weight gain _1 ; Weight loss _2 ; Anorexia _3 ; Fatigue Blue.Reese ]; Fever _4 ; Chills _5 ; Weakness Blue.Reese ]  ?Cardiac: Chest pain/pressure _6 ; Resting SOB _7 ; Exertional SOB Blue.Reese ]; Orthopnea _8 ; Pedal Edema _9 ; Palpitations _10 ; Syncope _11 ; Presyncope _12 ; Paroxysmal nocturnal dyspnea_13   ?Pulmonary: Cough _14 ; Wheezing_15 ; Hemoptysis_16 ; Sputum _17 ; Snoring _18   ?GI: Vomiting_19 ; Dysphagia_20 ; Melena_21 ; Hematochezia _22 ; Heartburn_23 ; Abdominal pain _24 ; Constipation _25 ; Diarrhea _26 ; BRBPR _27   ?GU: Hematuria_28 ; Dysuria _29 ; Nocturia_30   ?Vascular: Pain in legs with walking _31 ; Pain in feet with lying flat _32 ; Non-healing sores _33 ; Stroke _34 ; TIA _35 ; Slurred speech _36 ;  ?Neuro: Headaches_37 ; Vertigo_38 ; Seizures_39 ; Paresthesias_40 ;Blurred vision _41 ; Diplopia _42 ; Vision changes _43   ?Ortho/Skin: Arthritis _44 ; Joint pain _45 ; Muscle pain _46 ; Joint swelling _47 ; Back Pain _48 ; Rash _49   ?Psych: Depression[y ]; Anxiety[y ]  ?Heme: Bleeding problems _50 ; Clotting disorders _51 ; Anemia _52   ?  Endocrine: Diabetes Blue.Reese ]; Thyroid dysfunction_0  ? ?Past Medical History:  ?Diagnosis Date  ? Adenomatous colon polyp 02/2006  ? Allergy   ? Allergy, unspecified not elsewhere classified   ? Anemia   ? Anxiety   ? Arthritis   ? Asthma   ? Blood transfusion without reported diagnosis   ? Bronchiectasis   ? CAD (coronary artery disease)   ? BMS to the LAD, 2002; cath 12/07/11 patent LAD stent and mild nonobstructive disease, EF 65%; Medical management  ? Cataract   ? removed both eyes  ? CHF (congestive heart failure) ([place])   ? Diastolic  ? Chronic diastolic CHF (congestive heart failure) ([place]) 11/25/2011  ? Clotting disorder ([place])   ? in legs per pt   ? Diabetes  mellitus   ? Diverticulosis of colon   ? DJD (degenerative joint disease)   ? Fibromyalgia   ? GERD (gastroesophageal reflux disease)   ? H. pylori infection 05/12/2019  ? Hypercholesterolemia   ? Hypertension   ? Microscopic hematuria   ? Neoplasm of kidney   ? Obesity   ? Other diseases of lung, not elsewhere classified   ? Pinched vertebral nerve   ? Pulmonary nodule   ? Negative PET in 2010  ? Stress incontinence, female   ? Syncope   ? Ulcerative colitis, left sided [facility]) 2008  ? Hx of  ? UTI (lower urinary tract infection)   ? Vitamin D deficiency disease   ? ? ?Current Outpatient Medications  ?Medication Sig Dispense Refill  ? acetaminophen (TYLENOL) 500 MG tablet Take 500 mg by mouth every 6 (six) hours as needed for headache (pain).     ? albuterol (PROVENTIL) (2.5 MG/3ML) 0.083% nebulizer solution Take 3 mLs (2.5 mg total) by nebulization every 4 (four) hours as needed for wheezing or shortness of breath. 75 mL 3  ? albuterol (VENTOLIN HFA) 108 (90 BASE) MCG/ACT inhaler Inhale 2 puffs into the lungs every 4 (four) hours as needed for wheezing or shortness of breath. 18 each 11  ? ALPRAZolam (XANAX) 0.5 MG tablet Take 1 tablet (0.5 mg total) by mouth 3 (three) times daily as needed. (Patient taking differently: Take 0.5 mg by mouth 3 (three) times daily as needed for anxiety.) 90 tablet 5  ? amLODipine (NORVASC) 10 MG tablet Take 1 tablet (10 mg total) by mouth daily. 90 tablet 2  ? atorvastatin (LIPITOR) 20 MG tablet TAKE 1 TABLET (20 MG TOTAL) BY MOUTH DAILY. PLEASE KEEP UPCOMING APPT FOR FUTURE REFILLS. 90 tablet 3  ? azelastine (ASTELIN) 0.1 % nasal spray Place 1 spray into both nostrils at bedtime.    ? Blood Glucose Monitoring Suppl (ONETOUCH VERIO FLEX SYSTEM) w/Device KIT 1 each by Does not apply route daily at 12 noon. 1 kit 0  ? Cholecalciferol (VITAMIN D-3) 5000 UNITS TABS Patient takes 1 tablet by mouth once a week on Mondays.    ? clopidogrel (PLAVIX) 75 MG tablet Take 1 tablet (75 mg total) by  mouth daily. 90 tablet 3  ? diclofenac sodium (VOLTAREN) 1 % GEL Apply 2 g topically 4 (four) times daily as needed (Pain).    ? fluticasone (FLONASE) 50 MCG/ACT nasal spray PLACE 2 SPRAYS IN EACH NOSTRIL AT BEDTIME (Patient taking differently: Place 2 sprays into both nostrils at bedtime.) 16 g 6  ? gabapentin (NEURONTIN) 300 MG capsule Take 1 capsule (300 mg total) by mouth 2 (two) times daily. 60 capsule 0  ? glimepiride (AMARYL) 1 MG  tablet Take 2 tablets (2 mg total) by mouth daily with breakfast. 90 tablet 3  ? glucose blood (ONETOUCH VERIO) test strip Use to check blood glucose once daily 100 each 1  ? [START ON 08/28/2021] HYDROcodone-acetaminophen (NORCO) 10-325 MG tablet Take 1 tablet by mouth every 6 (six) hours as needed for severe pain. 120 tablet 0  ? hydrOXYzine (VISTARIL) 25 MG capsule TAKE 1 CAPSULE (25 MG TOTAL) BY MOUTH EVERY 6 (SIX) HOURS AS NEEDED. 360 capsule 0  ? levocetirizine (XYZAL) 5 MG tablet Take 5 mg by mouth every evening.    ? LIDODERM 5 % APPLY 1 PATCH TO SKIN FOR 12 HOURS THEN REMOVE AND DISCARD PATCH WITHIN 72 HOURS OR AS DIRECTED 30 patch 2  ? loperamide (IMODIUM) 2 MG capsule Take 4-6 mg by mouth daily.    ? losartan (COZAAR) 25 MG tablet Take 1 tablet (25 mg total) by mouth daily. 30 tablet 0  ? magnesium oxide (MAG-OX) 400 MG tablet Patient takes 2 tablets by mouth twice a day.    ? mesalamine (LIALDA) 1.2 g EC tablet TAKE 2 TABLETS (2.4 G TOTAL) BY MOUTH 2 (TWO) TIMES DAILY. 360 tablet 1  ? metFORMIN (GLUCOPHAGE XR) 750 MG 24 hr tablet Take 1 tablet (750 mg total) by mouth daily with breakfast. 90 tablet 3  ? metoprolol succinate (TOPROL XL) 100 MG 24 hr tablet Take 1 tablet (100 mg total) by mouth daily. Take with or immediately following a meal. 30 tablet 11  ? montelukast (SINGULAIR) 10 MG tablet Take one every morning 90 tablet 3  ? neomycin-polymyxin-hydrocortisone (CORTISPORIN) OTIC solution Place 4 drops into both ears 4 (four) times daily. 10 mL 2  ? nitroGLYCERIN  (NITROSTAT) 0.4 MG SL tablet PLACE 1 TABLET (0.4 MG TOTAL) UNDER THE TONGUE EVERY 5 (FIVE) MINUTES AS NEEDED FOR CHEST PAIN. 75 tablet 1  ? Olopatadine HCl 0.7 % SOLN Place 1 drop into both eyes daily as neede

## 2021-08-16 ENCOUNTER — Ambulatory Visit (HOSPITAL_COMMUNITY)
Admit: 2021-08-16 | Discharge: 2021-08-16 | Disposition: A | Discharge status: Home / Self Care | Payer: PPO | Attending: Family Medicine | Admitting: Family Medicine

## 2021-08-16 ENCOUNTER — Encounter (HOSPITAL_COMMUNITY): Payer: Self-pay

## 2021-08-16 ENCOUNTER — Telehealth: Payer: Self-pay | Admitting: Gastroenterology

## 2021-08-16 VITALS — BP 134/84 | HR 83 | Wt 204.0 lb

## 2021-08-16 DIAGNOSIS — I10 Essential (primary) hypertension: Secondary | ICD-10-CM | POA: Diagnosis not present

## 2021-08-16 DIAGNOSIS — M6281 Muscle weakness (generalized): Secondary | ICD-10-CM | POA: Diagnosis not present

## 2021-08-16 DIAGNOSIS — I5022 Chronic systolic (congestive) heart failure: Secondary | ICD-10-CM | POA: Diagnosis not present

## 2021-08-16 DIAGNOSIS — E119 Type 2 diabetes mellitus without complications: Secondary | ICD-10-CM | POA: Diagnosis not present

## 2021-08-16 DIAGNOSIS — M797 Fibromyalgia: Secondary | ICD-10-CM | POA: Diagnosis not present

## 2021-08-16 DIAGNOSIS — Z79899 Other long term (current) drug therapy: Secondary | ICD-10-CM | POA: Insufficient documentation

## 2021-08-16 DIAGNOSIS — I2781 Cor pulmonale (chronic): Secondary | ICD-10-CM

## 2021-08-16 DIAGNOSIS — I25119 Atherosclerotic heart disease of native coronary artery with unspecified angina pectoris: Secondary | ICD-10-CM

## 2021-08-16 DIAGNOSIS — G4733 Obstructive sleep apnea (adult) (pediatric): Secondary | ICD-10-CM | POA: Diagnosis not present

## 2021-08-16 DIAGNOSIS — Z955 Presence of coronary angioplasty implant and graft: Secondary | ICD-10-CM | POA: Insufficient documentation

## 2021-08-16 DIAGNOSIS — I491 Atrial premature depolarization: Secondary | ICD-10-CM

## 2021-08-16 DIAGNOSIS — Z7984 Long term (current) use of oral hypoglycemic drugs: Secondary | ICD-10-CM | POA: Diagnosis not present

## 2021-08-16 DIAGNOSIS — R5381 Other malaise: Secondary | ICD-10-CM

## 2021-08-16 DIAGNOSIS — I5032 Chronic diastolic (congestive) heart failure: Secondary | ICD-10-CM | POA: Insufficient documentation

## 2021-08-16 DIAGNOSIS — K5190 Ulcerative colitis, unspecified, without complications: Secondary | ICD-10-CM | POA: Insufficient documentation

## 2021-08-16 DIAGNOSIS — I2510 Atherosclerotic heart disease of native coronary artery without angina pectoris: Secondary | ICD-10-CM | POA: Diagnosis not present

## 2021-08-16 DIAGNOSIS — E875 Hyperkalemia: Secondary | ICD-10-CM

## 2021-08-16 DIAGNOSIS — R001 Bradycardia, unspecified: Secondary | ICD-10-CM

## 2021-08-16 DIAGNOSIS — J9611 Chronic respiratory failure with hypoxia: Secondary | ICD-10-CM | POA: Diagnosis not present

## 2021-08-16 DIAGNOSIS — I110 Hypertensive heart disease with heart failure: Secondary | ICD-10-CM | POA: Insufficient documentation

## 2021-08-16 LAB — BASIC METABOLIC PANEL
Anion gap: 11 (ref 5–15)
BUN: 12 mg/dL (ref 8–23)
CO2: 26 mmol/L (ref 22–32)
Calcium: 9.9 mg/dL (ref 8.9–10.3)
Chloride: 101 mmol/L (ref 98–111)
Creatinine, Ser: 0.66 mg/dL (ref 0.44–1.00)
GFR, Estimated: >60 mL/min (ref >60)
Glucose, Bld: 122 mg/dL — ABNORMAL HIGH (ref 70–99)
Potassium: 3.9 mmol/L (ref 3.5–5.1)
Sodium: 138 mmol/L (ref 135–145)

## 2021-08-16 LAB — CBC
HCT: 42.9 % (ref 36.0–46.0)
Hemoglobin: 13.8 g/dL (ref 12.0–15.0)
MCH: 29.5 pg (ref 26.0–34.0)
MCHC: 32.2 g/dL (ref 30.0–36.0)
MCV: 91.7 fL (ref 80.0–100.0)
Platelets: 577 10*3/uL — ABNORMAL HIGH (ref 150–400)
RBC: 4.68 MIL/uL (ref 3.87–5.11)
RDW: 13.9 % (ref 11.5–15.5)
WBC: 13.6 10*3/uL — ABNORMAL HIGH (ref 4.0–10.5)
nRBC: 0 % (ref 0.0–0.2)

## 2021-08-16 NOTE — Telephone Encounter (Signed)
The pt's son wanted to make sure that none of the medications that Dr Fuller Plan prescribed was discontinued during recent hospitalization.  He read out the medications that were stopped and all were PCP or cardiology prescribed.  He did mention glycopyrrolate, and lialda.  She is not taking either of those.  She has a follow up on  5/23 and will discuss further at that time. The son is just now taking over as caregiver and wants to make sure she is taking everything she is suppose to.    Dr Fuller Plan do you have any recommendations on the lialda or glycopyrrolate now or discuss at her office visit? ?

## 2021-08-16 NOTE — Telephone Encounter (Signed)
Inbound call from Gwyndolyn Saxon patients son, stating that patient was needing a hospital follow up. Patient was scheduled for 5/23. ? ?He also stated that he would like to discuss with the nurse that while she was in the hospital that they change her medications and he is wanting to see if Dr. Fuller Plan is okay with it. Please advise.  ?

## 2021-08-16 NOTE — Progress Notes (Signed)
Called Thedore Mins at [facility] [phone]) to follow up on Trilogy PA, he states it is still pending and may take a while for approval.  ? ?Allena Katz, NP aware, she will go ahead and order split night sleep study, order placed ?

## 2021-08-16 NOTE — Telephone Encounter (Signed)
Please review my 12/19/2020 office note and resume Zenpep, Lialda and glycopyrrolate at doses and schedules outlined.  ?

## 2021-08-16 NOTE — Patient Instructions (Signed)
Labs done today, your results will be available in MyChart, we will contact you for abnormal readings. ? ?Your physician has recommended that you have a sleep study. This test records several body functions during sleep, including: brain activity, eye movement, oxygen and carbon dioxide blood levels, heart rate and rhythm, breathing rate and rhythm, the flow of air through your mouth and nose, snoring, body muscle movements, and chest and belly movement. ONCE APPROVED BY YOUR INSURANCE WE WILL CALL YOU TO SCHEDULE THIS ? ?Your physician recommends that you schedule a follow-up appointment in: 6 weeks and again in 3 months ? ?If you have any questions or concerns before your next appointment please send [place] a message through [place] or call our office at [phone].   ? ?TO LEAVE A MESSAGE FOR THE NURSE SELECT OPTION 2, PLEASE LEAVE A MESSAGE INCLUDING: ?YOUR NAME ?DATE OF BIRTH ?CALL BACK NUMBER ?REASON FOR CALL**this is important as we prioritize the call backs ? ?YOU WILL RECEIVE A CALL BACK THE SAME DAY AS LONG AS YOU CALL BEFORE 4:00 PM ? ?At the [facility], you and your health needs are our priority. As part of our continuing mission to provide you with exceptional heart care, we have created designated Provider Care Teams. These Care Teams include your primary Cardiologist (physician) and Advanced Practice Providers (APPs- Physician Assistants and Nurse Practitioners) who all work together to provide you with the care you need, when you need it.  ? ?You may see any of the following providers on your designated Care Team at your next follow up: ?Dr Glori Bickers ?Dr Loralie Champagne ?Darrick Grinder, NP ?Lyda Jester, PA ?Jessica Milford,NP ?Marlyce Huge, PA ?Audry Riles, PharmD ? ? ?Please be sure to bring in all your medications bottles to every appointment.  ? ?

## 2021-08-16 NOTE — Telephone Encounter (Signed)
See alternate phone note 4/19 ?

## 2021-08-16 NOTE — Addendum Note (Signed)
Encounter addended by: Rafael Bihari, FNP on: 08/16/2021 3:27 PM ? Actions taken: Visit diagnoses modified, Clinical Note Signed

## 2021-08-17 DIAGNOSIS — K861 Other chronic pancreatitis: Secondary | ICD-10-CM | POA: Diagnosis not present

## 2021-08-17 DIAGNOSIS — I2609 Other pulmonary embolism with acute cor pulmonale: Secondary | ICD-10-CM | POA: Diagnosis not present

## 2021-08-17 DIAGNOSIS — Z9981 Dependence on supplemental oxygen: Secondary | ICD-10-CM | POA: Diagnosis not present

## 2021-08-17 DIAGNOSIS — J470 Bronchiectasis with acute lower respiratory infection: Secondary | ICD-10-CM | POA: Diagnosis not present

## 2021-08-17 DIAGNOSIS — Z7902 Long term (current) use of antithrombotics/antiplatelets: Secondary | ICD-10-CM | POA: Diagnosis not present

## 2021-08-17 DIAGNOSIS — K219 Gastro-esophageal reflux disease without esophagitis: Secondary | ICD-10-CM | POA: Diagnosis not present

## 2021-08-17 DIAGNOSIS — I2510 Atherosclerotic heart disease of native coronary artery without angina pectoris: Secondary | ICD-10-CM | POA: Diagnosis not present

## 2021-08-17 DIAGNOSIS — I5023 Acute on chronic systolic (congestive) heart failure: Secondary | ICD-10-CM | POA: Diagnosis not present

## 2021-08-17 DIAGNOSIS — F419 Anxiety disorder, unspecified: Secondary | ICD-10-CM | POA: Diagnosis not present

## 2021-08-17 DIAGNOSIS — J189 Pneumonia, unspecified organism: Secondary | ICD-10-CM | POA: Diagnosis not present

## 2021-08-17 DIAGNOSIS — G4733 Obstructive sleep apnea (adult) (pediatric): Secondary | ICD-10-CM | POA: Diagnosis not present

## 2021-08-17 DIAGNOSIS — E119 Type 2 diabetes mellitus without complications: Secondary | ICD-10-CM | POA: Diagnosis not present

## 2021-08-17 DIAGNOSIS — H02402 Unspecified ptosis of left eyelid: Secondary | ICD-10-CM | POA: Diagnosis not present

## 2021-08-17 DIAGNOSIS — E559 Vitamin D deficiency, unspecified: Secondary | ICD-10-CM | POA: Diagnosis not present

## 2021-08-17 DIAGNOSIS — I110 Hypertensive heart disease with heart failure: Secondary | ICD-10-CM | POA: Diagnosis not present

## 2021-08-17 DIAGNOSIS — D649 Anemia, unspecified: Secondary | ICD-10-CM | POA: Diagnosis not present

## 2021-08-17 DIAGNOSIS — N289 Disorder of kidney and ureter, unspecified: Secondary | ICD-10-CM | POA: Diagnosis not present

## 2021-08-17 DIAGNOSIS — E669 Obesity, unspecified: Secondary | ICD-10-CM | POA: Diagnosis not present

## 2021-08-17 DIAGNOSIS — J9601 Acute respiratory failure with hypoxia: Secondary | ICD-10-CM | POA: Diagnosis not present

## 2021-08-17 DIAGNOSIS — E7800 Pure hypercholesterolemia, unspecified: Secondary | ICD-10-CM | POA: Diagnosis not present

## 2021-08-17 DIAGNOSIS — K5790 Diverticulosis of intestine, part unspecified, without perforation or abscess without bleeding: Secondary | ICD-10-CM | POA: Diagnosis not present

## 2021-08-17 DIAGNOSIS — I2723 Pulmonary hypertension due to lung diseases and hypoxia: Secondary | ICD-10-CM | POA: Diagnosis not present

## 2021-08-17 DIAGNOSIS — M797 Fibromyalgia: Secondary | ICD-10-CM | POA: Diagnosis not present

## 2021-08-17 DIAGNOSIS — M5412 Radiculopathy, cervical region: Secondary | ICD-10-CM | POA: Diagnosis not present

## 2021-08-21 ENCOUNTER — Encounter: Payer: Self-pay | Admitting: Cardiology

## 2021-08-21 ENCOUNTER — Ambulatory Visit (INDEPENDENT_AMBULATORY_CARE_PROVIDER_SITE_OTHER): Payer: PPO | Admitting: Family Medicine

## 2021-08-21 ENCOUNTER — Telehealth: Payer: Self-pay | Admitting: Pharmacist

## 2021-08-21 ENCOUNTER — Ambulatory Visit (INDEPENDENT_AMBULATORY_CARE_PROVIDER_SITE_OTHER): Payer: PPO | Admitting: Cardiology

## 2021-08-21 ENCOUNTER — Encounter: Payer: Self-pay | Admitting: Family Medicine

## 2021-08-21 ENCOUNTER — Other Ambulatory Visit: Payer: Self-pay

## 2021-08-21 VITALS — BP 118/72 | HR 78 | Temp 98.6°F | Wt 200.0 lb

## 2021-08-21 DIAGNOSIS — E119 Type 2 diabetes mellitus without complications: Secondary | ICD-10-CM | POA: Diagnosis not present

## 2021-08-21 DIAGNOSIS — J9601 Acute respiratory failure with hypoxia: Secondary | ICD-10-CM | POA: Diagnosis not present

## 2021-08-21 DIAGNOSIS — G4733 Obstructive sleep apnea (adult) (pediatric): Secondary | ICD-10-CM

## 2021-08-21 DIAGNOSIS — I5021 Acute systolic (congestive) heart failure: Secondary | ICD-10-CM | POA: Diagnosis not present

## 2021-08-21 DIAGNOSIS — I25119 Atherosclerotic heart disease of native coronary artery with unspecified angina pectoris: Secondary | ICD-10-CM

## 2021-08-21 DIAGNOSIS — R570 Cardiogenic shock: Secondary | ICD-10-CM | POA: Diagnosis not present

## 2021-08-21 DIAGNOSIS — I10 Essential (primary) hypertension: Secondary | ICD-10-CM

## 2021-08-21 LAB — MAGNESIUM: Magnesium: 1.5 mg/dL (ref 1.5–2.5)

## 2021-08-21 MED ORDER — POTASSIUM CHLORIDE CRYS ER 20 MEQ PO TBCR: 20.0000 meq | EXTENDED_RELEASE_TABLET | Freq: Two times a day (BID) | ORAL | 11 refills | Status: DC

## 2021-08-21 MED ORDER — GLYCOPYRROLATE 1 MG PO TABS: 1.0000 mg | ORAL_TABLET | Freq: Three times a day (TID) | ORAL | 0 refills | Status: DC

## 2021-08-21 NOTE — Assessment & Plan Note (Signed)
Soon to get home sleep study.  Has PAH. ?

## 2021-08-21 NOTE — Assessment & Plan Note (Signed)
Continuing with Toprol, torsemide 40 mg a day.  Lab work is stable.  On losartan 25 mg a day as well.  Appreciate Dr. Ebbie Latus heart failure clinic.  Heart catheterization reviewed.  Pulmonary hypertension noted.  Secondary.  Continue with weight loss.  Conditioning efforts.  Sleep study upcoming. ?

## 2021-08-21 NOTE — Telephone Encounter (Signed)
Spoke with patient's son & he is aware of the medications that patient should resume. No further questions. ?

## 2021-08-21 NOTE — Progress Notes (Signed)
? ?  Subjective:  ? ? Patient ID: [name], female    DOB: 05-23-1940, 81 y.o.   MRN: [id number] ? ?HPI ?Here with her son to follow up a hospital stay from 08-03-21 to 08-11-21 for acute respiratory and cardiac failure. She was found breathing but unresponsive by the family and EMS was called. She was intubated at the ED. A chest CT ruled out PE but she had bilateral infiltrates. There was some debate about whether this was pulmonary edema or the result of a viral pneumonia. Her WBC remained elevated throughout her stay (WBC was 13.6 at DC), so she was presumed to have an infection of some sort. She was treated with antibiotics until DC. A bedside ECHO revealed an EF of 35% at first, but a few days later a cardiac catheterization showed this to be 55-60%. She was also found to have global coronary artery disease, with the worst in the LAD (had a 90% stenosis). Her renal function remained normal , and her creatinine was 0.66 at DC. The cath also revealed significant pulmonary HTN, so her medications were adjusted accordingly. She definitely has a hypoventilation syndrome, and she may have sleep apnea in addition to that. She is scheduled for a sleep study on May 23. She was sent home on 3 liters of oxygen, but this has been changed to using 2 liters at bedtime. She saw Cardiology last week and they rechecked labs which were stable. She had a low magnesium in the hospital and this was replaced per IV. Today she denies any chest pain or SOB, she simply feels tired. ? ? ?Review of Systems  ?Constitutional:  Positive for fatigue.  ?Respiratory: Negative.    ?Cardiovascular: Negative.   ?Gastrointestinal: Negative.   ?Genitourinary: Negative.   ?Neurological: Negative.   ? ?   ?Objective:  ? Physical Exam ?Constitutional:   ?   Comments: Walks with a rolling walker   ?Cardiovascular:  ?   Rate and Rhythm: Normal rate and regular rhythm.  ?   Pulses: Normal pulses.  ?   Heart sounds: Normal heart sounds.  ?   Comments:  Occasional ectopy  ?Pulmonary:  ?   Effort: Pulmonary effort is normal.  ?   Breath sounds: Normal breath sounds.  ?Neurological:  ?   Mental Status: She is alert and oriented to person, place, and time.  ? ? ? ? ? ?   ?Assessment & Plan:  ?She is recovering from an acute respiratory and cardiac failure, possibly triggerd by a viral pneumonia. She will have a sleep study as noted above and she she ill continue to wear oxygen at night. We will check a magnesium level today. Her HTN is stable. We spent a total of ( 34  ) minutes reviewing records and discussing these issues.  ?Alysia Penna, MD ? ? ?

## 2021-08-21 NOTE — Assessment & Plan Note (Signed)
Prior stent to the LAD in 2002.  Stent remains patent.  Nonobstructive coronary disease elsewhere.  Continue with current medical management.  She is off of isosorbide.  Overall doing well. ?

## 2021-08-21 NOTE — Patient Instructions (Signed)
Medication Instructions:  ?The current medical regimen is effective;  continue present plan and medications. ? ?*If you need a refill on your cardiac medications before your next appointment, please call your pharmacy* ? ?Follow-Up: ?At [facility], you and your health needs are our priority.  As part of our continuing mission to provide you with exceptional heart care, we have created designated Provider Care Teams.  These Care Teams include your primary Cardiologist (physician) and Advanced Practice Providers (APPs -  Physician Assistants and Nurse Practitioners) who all work together to provide you with the care you need, when you need it. ? ?We recommend signing up for the patient portal called "MyChart".  Sign up information is provided on this After Visit Summary.  MyChart is used to connect with patients for Virtual Visits (Telemedicine).  Patients are able to view lab/test results, encounter notes, upcoming appointments, etc.  Non-urgent messages can be sent to your provider as well.   ?To learn more about what you can do with MyChart, go to [email or web address].   ? ?Your next appointment:   ?6 month(s) ? ?The format for your next appointment:   ?In Person ? ?Provider:   ?Robbie Lis, PA-C, Nicholes Rough, PA-C, Christen Bame, NP, or Richardson Dopp, PA-C       ? ?Thank you for choosing [facility]!! ? ? ? ?Important Information About [place] ? ? ? ? ?  ?

## 2021-08-21 NOTE — Progress Notes (Signed)
?Cardiology Office Note:   ? ?Date:  08/21/2021  ? ?ID:  [name], DOB August 10, 1940, MRN [id number] ? ?PCP:  [name], MD ?  ?[facility] Providers ?Cardiologist:  Candee Furbish, MD    ? ?Referring MD: [name], MD  ? ? ?History of Present Illness:   ? ?[name] is a 81 y.o. female here for follow-up coronary artery disease seen last by me on 03/13/2021 in the office setting, CAD bare-metal stent 3.5 x 20 mm express in the proximal LAD with 3 subsequent cardiac catheterizations showing patent stent.  Intolerant to aspirin.  Has ulcerative colitis.  Her son is with her again.  On dialysis. ? ?She was hospitalized recently, found unresponsive by family, EMS was called intubated in the emergency department hypotensive and bradycardic.  She was on norepinephrine atropine was given.  Dr. Haroldine Laws saw her, RV was reduced.  There was concern for sepsis.  She had pneumonia in the setting of underlying pulmonary arterial hypertension/cor pulmonale. ? ?She underwent a heart catheterization that showed moderate nonobstructive coronary artery disease with moderate pulmonary arterial hypertension with preserved output. ? ?Recommendation was for evaluation of obstructive sleep apnea.  Pressors were weaned off. ? ?She has been feeling tired and weak atypical chest pain episodes.  [facility].  Lives with her son.  Physical therapy. ? ?She has been ordered for NIV/trilogy.  Home sleep study has been ordered. For May 23. On 2L at night ?Weight loss.  If she has residual PAH can try sildenafil. ?NYHA class III limited by deconditioning and body habitus.  Volume overall reassuring. ? ?On torsemide 40 mg losartan 25 mg Toprol 100 mg ? ?Off atenolol now on metoprolol ?Off Imdur ?Off lasix and on tosedmide ? ?Slowly getting better.  She is down another 5 pounds.  Blood work reassuring at last check on 08/16/2021 with creatinine of 0.6 potassium of 3.9 hemoglobin of 13.8 ? ? ?Past Medical History:   ?Diagnosis Date  ? Adenomatous colon polyp 02/2006  ? Allergy   ? Allergy, unspecified not elsewhere classified   ? Anemia   ? Anxiety   ? Arthritis   ? Asthma   ? Blood transfusion without reported diagnosis   ? Bronchiectasis   ? CAD (coronary artery disease)   ? BMS to the LAD, 2002; cath 12/07/11 patent LAD stent and mild nonobstructive disease, EF 65%; Medical management  ? Cataract   ? removed both eyes  ? CHF (congestive heart failure) ([place])   ? Diastolic  ? Chronic diastolic CHF (congestive heart failure) ([place]) 11/25/2011  ? Clotting disorder ([place])   ? in legs per pt   ? Diabetes mellitus   ? Diverticulosis of colon   ? DJD (degenerative joint disease)   ? Fibromyalgia   ? GERD (gastroesophageal reflux disease)   ? H. pylori infection 05/12/2019  ? Hypercholesterolemia   ? Hypertension   ? Microscopic hematuria   ? Neoplasm of kidney   ? Obesity   ? Other diseases of lung, not elsewhere classified   ? Pinched vertebral nerve   ? Pulmonary nodule   ? Negative PET in 2010  ? Stress incontinence, female   ? Syncope   ? Ulcerative colitis, left sided [facility]) 2008  ? Hx of  ? UTI (lower urinary tract infection)   ? Vitamin D deficiency disease   ? ? ?Past Surgical History:  ?Procedure Laterality Date  ? ABDOMINAL HYSTERECTOMY    ? CARDIAC CATHETERIZATION  11/2011  ? CATARACT EXTRACTION, BILATERAL  2020  ? CHOLECYSTECTOMY    ? COLONOSCOPY  01/09/2018  ? per Dr. Fuller Plan, adenomatous polyps, repeat in 3 yrs  ? CORONARY STENT PLACEMENT  2002  ? BMS to the LAD  ? ESOPHAGOGASTRODUODENOSCOPY  12/2008  ? HAND SURGERY  01/2006  ? Right Hand Surgery by Dr. Amedeo Plenty  ? LEFT HEART CATHETERIZATION WITH CORONARY ANGIOGRAM N/A 12/07/2011  ? Procedure: LEFT HEART CATHETERIZATION WITH CORONARY ANGIOGRAM;  Surgeon: Peter M Martinique, MD;  Location: [facility] CATH LAB;  Service: Cardiovascular;  Laterality: N/A;  ? RIGHT/LEFT HEART CATH AND CORONARY ANGIOGRAPHY N/A 08/07/2021  ? Procedure: RIGHT/LEFT HEART CATH AND CORONARY ANGIOGRAPHY;  Surgeon:  Jolaine Artist, MD;  Location: [facility] CV LAB;  Service: Cardiovascular;  Laterality: N/A;  ? UPPER GASTROINTESTINAL ENDOSCOPY    ? ? ?Current Medications: ?Current Meds  ?Medication Sig  ? acetaminophen (TYLENOL) 500 MG tablet Take 500 mg by mouth every 6 (six) hours as needed for headache (pain).   ? albuterol (PROVENTIL) (2.5 MG/3ML) 0.083% nebulizer solution Take 3 mLs (2.5 mg total) by nebulization every 4 (four) hours as needed for wheezing or shortness of breath.  ? albuterol (VENTOLIN HFA) 108 (90 BASE) MCG/ACT inhaler Inhale 2 puffs into the lungs every 4 (four) hours as needed for wheezing or shortness of breath.  ? ALPRAZolam (XANAX) 0.5 MG tablet Take 1 tablet (0.5 mg total) by mouth 3 (three) times daily as needed. (Patient taking differently: Take 0.5 mg by mouth 3 (three) times daily as needed for anxiety.)  ? amLODipine (NORVASC) 10 MG tablet Take 1 tablet (10 mg total) by mouth daily.  ? Amylase-Lipase-Protease (PANCRELIPASE PO) Take 20,000-40,000 Units by mouth. Patient takes 2 capsules by mouth twice a day.  ? atorvastatin (LIPITOR) 20 MG tablet TAKE 1 TABLET (20 MG TOTAL) BY MOUTH DAILY. PLEASE KEEP UPCOMING APPT FOR FUTURE REFILLS.  ? azelastine (ASTELIN) 0.1 % nasal spray Place 1 spray into both nostrils at bedtime.  ? Blood Glucose Monitoring Suppl (ONETOUCH VERIO FLEX SYSTEM) w/Device KIT 1 each by Does not apply route daily at 12 noon.  ? Cholecalciferol (VITAMIN D-3) 5000 UNITS TABS Patient takes 1 tablet by mouth once a week on Mondays.  ? clopidogrel (PLAVIX) 75 MG tablet Take 1 tablet (75 mg total) by mouth daily.  ? diclofenac sodium (VOLTAREN) 1 % GEL Apply 2 g topically 4 (four) times daily as needed (Pain).  ? fluticasone (FLONASE) 50 MCG/ACT nasal spray PLACE 2 SPRAYS IN EACH NOSTRIL AT BEDTIME  ? gabapentin (NEURONTIN) 300 MG capsule Take 1 capsule (300 mg total) by mouth 2 (two) times daily.  ? glimepiride (AMARYL) 1 MG tablet Take 2 tablets (2 mg total) by mouth daily  with breakfast.  ? glucose blood (ONETOUCH VERIO) test strip Use to check blood glucose once daily  ? [START ON 08/28/2021] HYDROcodone-acetaminophen (NORCO) 10-325 MG tablet Take 1 tablet by mouth every 6 (six) hours as needed for severe pain.  ? hydrOXYzine (VISTARIL) 25 MG capsule TAKE 1 CAPSULE (25 MG TOTAL) BY MOUTH EVERY 6 (SIX) HOURS AS NEEDED.  ? levocetirizine (XYZAL) 5 MG tablet Take 5 mg by mouth every evening.  ? LIDODERM 5 % APPLY 1 PATCH TO SKIN FOR 12 HOURS THEN REMOVE AND DISCARD PATCH WITHIN 72 HOURS OR AS DIRECTED  ? loperamide (IMODIUM) 2 MG capsule Take 4-6 mg by mouth daily.  ? losartan (COZAAR) 25 MG tablet Take 1 tablet (25 mg total) by mouth daily.  ?  magnesium oxide (MAG-OX) 400 MG tablet Patient takes 2 tablets by mouth twice a day.  ? mesalamine (LIALDA) 1.2 g EC tablet TAKE 2 TABLETS (2.4 G TOTAL) BY MOUTH 2 (TWO) TIMES DAILY.  ? metFORMIN (GLUCOPHAGE XR) 750 MG 24 hr tablet Take 1 tablet (750 mg total) by mouth daily with breakfast.  ? metoprolol succinate (TOPROL XL) 100 MG 24 hr tablet Take 1 tablet (100 mg total) by mouth daily. Take with or immediately following a meal.  ? montelukast (SINGULAIR) 10 MG tablet Take one every morning  ? neomycin-polymyxin-hydrocortisone (CORTISPORIN) OTIC solution Place 4 drops into both ears 4 (four) times daily.  ? nitroGLYCERIN (NITROSTAT) 0.4 MG SL tablet PLACE 1 TABLET (0.4 MG TOTAL) UNDER THE TONGUE EVERY 5 (FIVE) MINUTES AS NEEDED FOR CHEST PAIN.  ? Olopatadine HCl 0.7 % SOLN Place 1 drop into both eyes daily as needed (itching/allergies).  ? ondansetron (ZOFRAN-ODT) 4 MG disintegrating tablet TAKE 1 TABLET BY MOUTH EVERY 8 HOURS AS NEEDED FOR NAUSEA AND VOMITING  ? OneTouch Delica Lancets 45Y MISC 1 each by Does not apply route daily at 12 noon.  ? pantoprazole (PROTONIX) 40 MG tablet TAKE 1 TABLET BY MOUTH TWICE A DAY  ? Polyethyl Glycol-Propyl Glycol (SYSTANE OP) Place 1 drop into both eyes daily. For dry eyes  ? sitaGLIPtin (JANUVIA) 100 MG  tablet Take 1 tablet (100 mg total) by mouth daily.  ? tiZANidine (ZANAFLEX) 4 MG tablet TAKE 1 TABLET BY MOUTH TWICE A DAY  ? TOBRADEX ophthalmic solution Place 1 drop into both eyes 4 (four) times daily.  ? t

## 2021-08-21 NOTE — Chronic Care Management (AMB) (Signed)
? ? ?Chronic Care Management ?Pharmacy Assistant  ? ?Name: [name]  MRN: [id number] DOB: [birth year] ? ?Reason for Encounter: Disease State ?  ?Conditions to be addressed/monitored: ?DMII ? ?Recent office visits:  ?08/21/21 [name], MD - Patient presented for Acute HFrEF and other concerns. Prescribed Glycopyrrolate 1 mg. Prescribed Potassium Chloride 20 Meq. Stopped Amylase-Lipase-Protease.  ? ?06/28/21  [name], MD - Patient presented via video for Chronic narcotic use and other concerns. Prescribed Hydrocodone - Acetaminophen 10-325 mg. ? ?06/16/21  [name], MD - Patient presented for Preventative health care. No medication changes. ? ?05/22/21 Criselda Peaches, LPN - Patient presented for Medicare Annual Wellness Exam. No medication changes. Pt goal of exercise 30 min 3 times weekly. ? ?05/02/21 [name], MD - Patient presented via video for Diabetes mellitus with coincident hypertension. Stopped Amoxicillin.  ? ?03/10/21 [name], MD - Patient presented via video for Chronic narcotic use and other concerns. Prescribed Hydrocodone - Acetaminophen 10-325 mg. ? ?03/09/21 [name], MD - Patient presented for Diabetes mellitus without complication and other concerns. Changed Glimepiride. ? ? ?Recent consult visits:  ?08/21/21 Candee Furbish C (Cardiology) - Patient presented for CAD involving native coronary artery of native heart with angina pectoris and other concerns. No medication changes. ? ?08/16/21 Rafael Bihari, FNP - Patient presented to [facility] for Cor pulmonale chronic and other concerns. No medication changes. ? ?08/07/21 Bensimhon, Shaune Pascal, MD (Cardio) - Patient presented for Procedure R/L Heart Cath and Coronary Angiography. ? ?05/16/21 Verner Chol (Family Med) - Claims encounter for Spondylosis without myelopathy or radiculopathy cervical region and other concerns. No other visit details available. ? ?04/19/21  Kennon Portela - Claims encounter for Personal history of nicotine dependence and other concerns. No other visit details available. ? ?03/22/21 Allwardt, Randa Evens, PA-C - Patient presented for Moderate asthma with exacerbation unspecified whether persistent. Prescribed Levofloxacin 500 mg. ? ?03/13/21 Jerline Pain, MD (Cardiology) - Patient presented for CAD involving native Coronary artery of native heart with angina pectoris and other concerns. No medication changes. ? ?03/07/21 Kennon Portela - Claims encounter for Type 2 diabetes mellitus without complications and other concerns. No other visit details available. ? ? ?Hospital visits:  ?Medication Reconciliation was completed by comparing discharge summary, patient?s EMR and Pharmacy list, and upon discussion with patient. ? ?Patient presented to [facility] on 08/03/21 due to Cardiogenic shock. Patient was present for 8 days. ? ?New?Medications Started at [facility] Discharge:?? ?-started  ?gabapentin (NEURONTIN) ?metoprolol succinate (Toprol XL) ?Torsemide ? ?Medication Changes at Hospital Discharge: ?-Changed  ?losartan (COZAAR ? ?Medications Discontinued at Hospital Discharge: ?-Stopped  ?atenolol 50 MG tablet (TENORMIN) ?diphenoxylate-atropine 2.5-0.025 MG tablet (LOMOTIL) ?furosemide 40 MG tablet (LASIX) ?gabapentin 600 MG tablet (NEURONTIN) ?glycopyrrolate 1 MG tablet (ROBINUL) ?isosorbide mononitrate 60 MG 24 hr tablet (IMDUR) ?Klor-Con M20 20 MEQ tablet (potassium chloride SA) ?nabumetone 750 MG tablet (RELAFEN) ? ?Medications that remain the same after Hospital Discharge:??  ?-All other medications will remain the same.   ? ?Medications: ?Outpatient Encounter Medications as of 08/21/2021  ?Medication Sig  ? acetaminophen (TYLENOL) 500 MG tablet Take 500 mg by mouth every 6 (six) hours as needed for headache (pain).   ? albuterol (PROVENTIL) (2.5 MG/3ML) 0.083% nebulizer solution Take 3 mLs (2.5 mg total) by nebulization every 4 (four) hours  as needed for wheezing or shortness of breath.  ? albuterol (VENTOLIN HFA) 108 (90  BASE) MCG/ACT inhaler Inhale 2 puffs into the lungs every 4 (four) hours as needed for wheezing or shortness of breath.  ? ALPRAZolam (XANAX) 0.5 MG tablet Take 1 tablet (0.5 mg total) by mouth 3 (three) times daily as needed. (Patient taking differently: Take 0.5 mg by mouth 3 (three) times daily as needed for anxiety.)  ? amLODipine (NORVASC) 10 MG tablet Take 1 tablet (10 mg total) by mouth daily.  ? Amylase-Lipase-Protease (PANCRELIPASE PO) Take 20,000-40,000 Units by mouth. Patient takes 2 capsules by mouth twice a day.  ? atorvastatin (LIPITOR) 20 MG tablet TAKE 1 TABLET (20 MG TOTAL) BY MOUTH DAILY. PLEASE KEEP UPCOMING APPT FOR FUTURE REFILLS.  ? azelastine (ASTELIN) 0.1 % nasal spray Place 1 spray into both nostrils at bedtime.  ? Blood Glucose Monitoring Suppl (ONETOUCH VERIO FLEX SYSTEM) w/Device KIT 1 each by Does not apply route daily at 12 noon.  ? Cholecalciferol (VITAMIN D-3) 5000 UNITS TABS Patient takes 1 tablet by mouth once a week on Mondays.  ? clopidogrel (PLAVIX) 75 MG tablet Take 1 tablet (75 mg total) by mouth daily.  ? diclofenac sodium (VOLTAREN) 1 % GEL Apply 2 g topically 4 (four) times daily as needed (Pain).  ? fluticasone (FLONASE) 50 MCG/ACT nasal spray PLACE 2 SPRAYS IN EACH NOSTRIL AT BEDTIME  ? gabapentin (NEURONTIN) 300 MG capsule Take 1 capsule (300 mg total) by mouth 2 (two) times daily.  ? glimepiride (AMARYL) 1 MG tablet Take 2 tablets (2 mg total) by mouth daily with breakfast.  ? glucose blood (ONETOUCH VERIO) test strip Use to check blood glucose once daily  ? [START ON 08/28/2021] HYDROcodone-acetaminophen (NORCO) 10-325 MG tablet Take 1 tablet by mouth every 6 (six) hours as needed for severe pain.  ? hydrOXYzine (VISTARIL) 25 MG capsule TAKE 1 CAPSULE (25 MG TOTAL) BY MOUTH EVERY 6 (SIX) HOURS AS NEEDED.  ? levocetirizine (XYZAL) 5 MG tablet Take 5 mg by mouth every evening.  ? LIDODERM 5 %  APPLY 1 PATCH TO SKIN FOR 12 HOURS THEN REMOVE AND DISCARD PATCH WITHIN 72 HOURS OR AS DIRECTED  ? loperamide (IMODIUM) 2 MG capsule Take 4-6 mg by mouth daily.  ? losartan (COZAAR) 25 MG tablet Take 1 tablet (25 mg total) by mouth daily.  ? magnesium oxide (MAG-OX) 400 MG tablet Patient takes 2 tablets by mouth twice a day.  ? mesalamine (LIALDA) 1.2 g EC tablet TAKE 2 TABLETS (2.4 G TOTAL) BY MOUTH 2 (TWO) TIMES DAILY.  ? metFORMIN (GLUCOPHAGE XR) 750 MG 24 hr tablet Take 1 tablet (750 mg total) by mouth daily with breakfast.  ? metoprolol succinate (TOPROL XL) 100 MG 24 hr tablet Take 1 tablet (100 mg total) by mouth daily. Take with or immediately following a meal.  ? montelukast (SINGULAIR) 10 MG tablet Take one every morning  ? neomycin-polymyxin-hydrocortisone (CORTISPORIN) OTIC solution Place 4 drops into both ears 4 (four) times daily.  ? nitroGLYCERIN (NITROSTAT) 0.4 MG SL tablet PLACE 1 TABLET (0.4 MG TOTAL) UNDER THE TONGUE EVERY 5 (FIVE) MINUTES AS NEEDED FOR CHEST PAIN.  ? Olopatadine HCl 0.7 % SOLN Place 1 drop into both eyes daily as needed (itching/allergies).  ? ondansetron (ZOFRAN-ODT) 4 MG disintegrating tablet TAKE 1 TABLET BY MOUTH EVERY 8 HOURS AS NEEDED FOR NAUSEA AND VOMITING  ? OneTouch Delica Lancets 09U MISC 1 each by Does not apply route daily at 12 noon.  ? pantoprazole (PROTONIX) 40 MG tablet TAKE 1 TABLET BY MOUTH TWICE A DAY  ?  Polyethyl Glycol-Propyl Glycol (SYSTANE OP) Place 1 drop into both eyes daily. For dry eyes  ? sitaGLIPtin (JANUVIA) 100 MG tablet Take 1 tablet (100 mg total) by mouth daily.  ? tiZANidine (ZANAFLEX) 4 MG tablet TAKE 1 TABLET BY MOUTH TWICE A DAY  ? TOBRADEX ophthalmic solution Place 1 drop into both eyes 4 (four) times daily.  ? torsemide 40 MG TABS Take 40 mg by mouth daily.  ? ?Facility-Administered Encounter Medications as of 08/21/2021  ?Medication  ? ipratropium-albuterol (DUONEB) 0.5-2.5 (3) MG/3ML nebulizer solution 3 mL  ?Recent Relevant Labs: ?Lab  Results  ?Component Value Date/Time  ? HGBA1C 7.8 (H) 06/16/2021 11:26 AM  ? HGBA1C 7.7 (H) 07/18/2020 11:43 AM  ?  ?Kidney Function ?Lab Results  ?Component Value Date/Time  ? CREATININE 0.66 08/16/2021

## 2021-08-22 ENCOUNTER — Telehealth: Payer: Self-pay | Admitting: *Deleted

## 2021-08-23 ENCOUNTER — Telehealth: Payer: Self-pay | Admitting: Family Medicine

## 2021-08-23 DIAGNOSIS — J189 Pneumonia, unspecified organism: Secondary | ICD-10-CM | POA: Diagnosis not present

## 2021-08-23 DIAGNOSIS — I2609 Other pulmonary embolism with acute cor pulmonale: Secondary | ICD-10-CM | POA: Diagnosis not present

## 2021-08-23 DIAGNOSIS — E669 Obesity, unspecified: Secondary | ICD-10-CM

## 2021-08-23 DIAGNOSIS — I50811 Acute right heart failure: Secondary | ICD-10-CM

## 2021-08-23 DIAGNOSIS — I5023 Acute on chronic systolic (congestive) heart failure: Secondary | ICD-10-CM | POA: Diagnosis not present

## 2021-08-23 DIAGNOSIS — Z9981 Dependence on supplemental oxygen: Secondary | ICD-10-CM

## 2021-08-23 DIAGNOSIS — Z86010 Personal history of colonic polyps: Secondary | ICD-10-CM

## 2021-08-23 DIAGNOSIS — K861 Other chronic pancreatitis: Secondary | ICD-10-CM

## 2021-08-23 DIAGNOSIS — G4733 Obstructive sleep apnea (adult) (pediatric): Secondary | ICD-10-CM | POA: Diagnosis not present

## 2021-08-23 DIAGNOSIS — K219 Gastro-esophageal reflux disease without esophagitis: Secondary | ICD-10-CM

## 2021-08-23 DIAGNOSIS — H02402 Unspecified ptosis of left eyelid: Secondary | ICD-10-CM

## 2021-08-23 DIAGNOSIS — D649 Anemia, unspecified: Secondary | ICD-10-CM

## 2021-08-23 DIAGNOSIS — Z7902 Long term (current) use of antithrombotics/antiplatelets: Secondary | ICD-10-CM

## 2021-08-23 DIAGNOSIS — Z87440 Personal history of urinary (tract) infections: Secondary | ICD-10-CM

## 2021-08-23 DIAGNOSIS — J9601 Acute respiratory failure with hypoxia: Secondary | ICD-10-CM | POA: Diagnosis not present

## 2021-08-23 DIAGNOSIS — K5790 Diverticulosis of intestine, part unspecified, without perforation or abscess without bleeding: Secondary | ICD-10-CM

## 2021-08-23 DIAGNOSIS — M5412 Radiculopathy, cervical region: Secondary | ICD-10-CM | POA: Diagnosis not present

## 2021-08-23 DIAGNOSIS — Z955 Presence of coronary angioplasty implant and graft: Secondary | ICD-10-CM

## 2021-08-23 DIAGNOSIS — F419 Anxiety disorder, unspecified: Secondary | ICD-10-CM

## 2021-08-23 DIAGNOSIS — Z7984 Long term (current) use of oral hypoglycemic drugs: Secondary | ICD-10-CM

## 2021-08-23 DIAGNOSIS — I2510 Atherosclerotic heart disease of native coronary artery without angina pectoris: Secondary | ICD-10-CM | POA: Diagnosis not present

## 2021-08-23 DIAGNOSIS — Z79899 Other long term (current) drug therapy: Secondary | ICD-10-CM

## 2021-08-23 DIAGNOSIS — E7800 Pure hypercholesterolemia, unspecified: Secondary | ICD-10-CM

## 2021-08-23 DIAGNOSIS — E559 Vitamin D deficiency, unspecified: Secondary | ICD-10-CM

## 2021-08-23 DIAGNOSIS — J470 Bronchiectasis with acute lower respiratory infection: Secondary | ICD-10-CM | POA: Diagnosis not present

## 2021-08-23 DIAGNOSIS — I2723 Pulmonary hypertension due to lung diseases and hypoxia: Secondary | ICD-10-CM | POA: Diagnosis not present

## 2021-08-23 DIAGNOSIS — Z9181 History of falling: Secondary | ICD-10-CM

## 2021-08-23 DIAGNOSIS — I110 Hypertensive heart disease with heart failure: Secondary | ICD-10-CM | POA: Diagnosis not present

## 2021-08-23 DIAGNOSIS — N289 Disorder of kidney and ureter, unspecified: Secondary | ICD-10-CM

## 2021-08-23 DIAGNOSIS — E119 Type 2 diabetes mellitus without complications: Secondary | ICD-10-CM | POA: Diagnosis not present

## 2021-08-23 DIAGNOSIS — M797 Fibromyalgia: Secondary | ICD-10-CM | POA: Diagnosis not present

## 2021-08-23 NOTE — Telephone Encounter (Signed)
Please okay these orders  ?

## 2021-08-23 NOTE — Telephone Encounter (Signed)
Please advise for verbal.  ?

## 2021-08-23 NOTE — Telephone Encounter (Signed)
Tillie Rung PT with [facility] is calling and needs verbal orders for 1x4 and then 1 eow x 4 ?

## 2021-08-24 ENCOUNTER — Telehealth: Payer: Self-pay

## 2021-08-24 NOTE — Telephone Encounter (Signed)
Pt message sent to  PCP for advise ?

## 2021-08-24 NOTE — Telephone Encounter (Signed)
Left a detailed message on [name]'s voicemail with the approval for orders as below.  ?

## 2021-08-25 NOTE — Telephone Encounter (Signed)
Left detailed message for [name] with [facility] regarding pt approved verbal orders, advised to call the office with any questions ?

## 2021-08-25 NOTE — Telephone Encounter (Signed)
Please okay these orders  ?

## 2021-08-28 ENCOUNTER — Other Ambulatory Visit: Payer: Self-pay

## 2021-08-28 ENCOUNTER — Other Ambulatory Visit: Payer: Self-pay | Admitting: Family Medicine

## 2021-08-28 DIAGNOSIS — K861 Other chronic pancreatitis: Secondary | ICD-10-CM | POA: Diagnosis not present

## 2021-08-28 DIAGNOSIS — I110 Hypertensive heart disease with heart failure: Secondary | ICD-10-CM | POA: Diagnosis not present

## 2021-08-28 DIAGNOSIS — K219 Gastro-esophageal reflux disease without esophagitis: Secondary | ICD-10-CM | POA: Diagnosis not present

## 2021-08-28 DIAGNOSIS — N289 Disorder of kidney and ureter, unspecified: Secondary | ICD-10-CM | POA: Diagnosis not present

## 2021-08-28 DIAGNOSIS — E119 Type 2 diabetes mellitus without complications: Secondary | ICD-10-CM | POA: Diagnosis not present

## 2021-08-28 DIAGNOSIS — E7800 Pure hypercholesterolemia, unspecified: Secondary | ICD-10-CM | POA: Diagnosis not present

## 2021-08-28 DIAGNOSIS — E559 Vitamin D deficiency, unspecified: Secondary | ICD-10-CM | POA: Diagnosis not present

## 2021-08-28 DIAGNOSIS — I5023 Acute on chronic systolic (congestive) heart failure: Secondary | ICD-10-CM | POA: Diagnosis not present

## 2021-08-28 DIAGNOSIS — H02402 Unspecified ptosis of left eyelid: Secondary | ICD-10-CM | POA: Diagnosis not present

## 2021-08-28 DIAGNOSIS — M797 Fibromyalgia: Secondary | ICD-10-CM | POA: Diagnosis not present

## 2021-08-28 DIAGNOSIS — I2510 Atherosclerotic heart disease of native coronary artery without angina pectoris: Secondary | ICD-10-CM | POA: Diagnosis not present

## 2021-08-28 DIAGNOSIS — I2609 Other pulmonary embolism with acute cor pulmonale: Secondary | ICD-10-CM | POA: Diagnosis not present

## 2021-08-28 DIAGNOSIS — J189 Pneumonia, unspecified organism: Secondary | ICD-10-CM | POA: Diagnosis not present

## 2021-08-28 DIAGNOSIS — J9601 Acute respiratory failure with hypoxia: Secondary | ICD-10-CM | POA: Diagnosis not present

## 2021-08-28 DIAGNOSIS — F419 Anxiety disorder, unspecified: Secondary | ICD-10-CM | POA: Diagnosis not present

## 2021-08-28 DIAGNOSIS — J470 Bronchiectasis with acute lower respiratory infection: Secondary | ICD-10-CM | POA: Diagnosis not present

## 2021-08-28 DIAGNOSIS — E669 Obesity, unspecified: Secondary | ICD-10-CM | POA: Diagnosis not present

## 2021-08-28 DIAGNOSIS — I2723 Pulmonary hypertension due to lung diseases and hypoxia: Secondary | ICD-10-CM | POA: Diagnosis not present

## 2021-08-28 DIAGNOSIS — D649 Anemia, unspecified: Secondary | ICD-10-CM | POA: Diagnosis not present

## 2021-08-28 DIAGNOSIS — Z9981 Dependence on supplemental oxygen: Secondary | ICD-10-CM | POA: Diagnosis not present

## 2021-08-28 DIAGNOSIS — K5790 Diverticulosis of intestine, part unspecified, without perforation or abscess without bleeding: Secondary | ICD-10-CM | POA: Diagnosis not present

## 2021-08-28 DIAGNOSIS — M5412 Radiculopathy, cervical region: Secondary | ICD-10-CM | POA: Diagnosis not present

## 2021-08-28 DIAGNOSIS — G4733 Obstructive sleep apnea (adult) (pediatric): Secondary | ICD-10-CM | POA: Diagnosis not present

## 2021-08-28 DIAGNOSIS — Z7902 Long term (current) use of antithrombotics/antiplatelets: Secondary | ICD-10-CM | POA: Diagnosis not present

## 2021-08-28 MED ORDER — MAGNESIUM OXIDE 400 MG PO TABS: 400.0000 mg | ORAL_TABLET | Freq: Every day | ORAL | 3 refills | Status: DC

## 2021-08-29 DIAGNOSIS — I110 Hypertensive heart disease with heart failure: Secondary | ICD-10-CM | POA: Diagnosis not present

## 2021-08-29 DIAGNOSIS — Z87891 Personal history of nicotine dependence: Secondary | ICD-10-CM | POA: Diagnosis not present

## 2021-08-29 DIAGNOSIS — I509 Heart failure, unspecified: Secondary | ICD-10-CM | POA: Diagnosis not present

## 2021-08-29 DIAGNOSIS — Z6839 Body mass index (BMI) 39.0-39.9, adult: Secondary | ICD-10-CM | POA: Diagnosis not present

## 2021-08-31 DIAGNOSIS — J984 Other disorders of lung: Secondary | ICD-10-CM | POA: Diagnosis not present

## 2021-09-05 ENCOUNTER — Telehealth: Payer: Self-pay | Admitting: Family Medicine

## 2021-09-05 MED ORDER — GABAPENTIN 300 MG PO CAPS: 300.0000 mg | ORAL_CAPSULE | Freq: Two times a day (BID) | ORAL | 5 refills | Status: DC

## 2021-09-05 NOTE — Telephone Encounter (Signed)
Last refill sent to pharmacy on 08/11/21 by Dr. Darylene Price.     Please advise if okay for refill. ?

## 2021-09-05 NOTE — Telephone Encounter (Signed)
Done

## 2021-09-05 NOTE — Telephone Encounter (Signed)
Called pt son Gwyndolyn Saxon, aware refill has been sent. ?

## 2021-09-05 NOTE — Telephone Encounter (Signed)
Pt need a refill on gabapentin (NEURONTIN) 300 MG capsule  ?[id number] [place] Phone:  [phone] ?Fax:  [phone] ?  ? ?

## 2021-09-07 NOTE — Telephone Encounter (Signed)
Spoke with [name] with [facility] state that she received verbal orders for pt ?

## 2021-09-13 DIAGNOSIS — N281 Cyst of kidney, acquired: Secondary | ICD-10-CM | POA: Diagnosis not present

## 2021-09-16 ENCOUNTER — Other Ambulatory Visit: Payer: Self-pay | Admitting: Family Medicine

## 2021-09-16 DIAGNOSIS — E119 Type 2 diabetes mellitus without complications: Secondary | ICD-10-CM

## 2021-09-18 ENCOUNTER — Encounter (HOSPITAL_BASED_OUTPATIENT_CLINIC_OR_DEPARTMENT_OTHER): Payer: PPO | Admitting: Cardiology

## 2021-09-19 ENCOUNTER — Ambulatory Visit (INDEPENDENT_AMBULATORY_CARE_PROVIDER_SITE_OTHER): Payer: PPO | Admitting: Gastroenterology

## 2021-09-19 ENCOUNTER — Encounter: Payer: Self-pay | Admitting: Gastroenterology

## 2021-09-19 VITALS — BP 142/80 | HR 76 | Ht 61.0 in | Wt 202.0 lb

## 2021-09-19 DIAGNOSIS — K5150 Left sided colitis without complications: Secondary | ICD-10-CM

## 2021-09-19 DIAGNOSIS — K580 Irritable bowel syndrome with diarrhea: Secondary | ICD-10-CM | POA: Diagnosis not present

## 2021-09-19 DIAGNOSIS — K8689 Other specified diseases of pancreas: Secondary | ICD-10-CM | POA: Diagnosis not present

## 2021-09-19 DIAGNOSIS — K219 Gastro-esophageal reflux disease without esophagitis: Secondary | ICD-10-CM | POA: Diagnosis not present

## 2021-09-19 NOTE — Patient Instructions (Signed)
Continue current medications.   Follow up with Dr. Fuller Plan in one year.  The [place] [facility] providers would like to encourage you to use [facility] to communicate with providers for non-urgent requests or questions.  Due to long hold times on the telephone, sending your provider a message by [facility] may be a faster and more efficient way to get a response.  Please allow 48 business hours for a response.  Please remember that this is for non-urgent requests.   Thank you for choosing me and [facility].  Pricilla Riffle. Dagoberto Ligas., MD., [facility] Regal

## 2021-09-19 NOTE — Progress Notes (Signed)
    Assessment     Left sided UC Pancreatic insufficiency  GERD IBS-D Personal history of tubular adenomas and sessile serrated polyps   Recommendations    Continue Lialda 2.4 g p.o. twice daily Continue Zenpep 40,000 2 p.o. before meals 1 p.o. before snacks Continue pantoprazole 40 mg twice daily and follow standard antireflux measures Continue glycopyrrolate 1 mg p.o. twice daily Continue Imodium 3 times daily as needed or Lomotil twice daily as needed Avoid foods and beverages that exacerbate GI symptoms Given age and comorbidities she is no longer under UC or polyp surveillance  REV in 1 year   HPI    This is a 81 year old female returns for follow-up accompanied by her son.  She was hospitalized from April 6 to April 14 with cardiogenic shock, acute respiratory failure, right lung pneumonia, cor pulmonale, OSA.  She has made a very good recovery.  Her GI symptoms are under good control on her current medication regimen.  Lomotil and glycopyrrolate were temporarily discontinued on discharge and we recommended she resume them when postprandial urgent diarrhea returned.  Her appetite is fair but improving.   Labs / Imaging       Latest Ref Rng & Units 08/10/2021    3:07 AM 08/04/2021    1:45 AM 08/03/2021   11:59 AM  Hepatic Function  Total Protein 6.5 - 8.1 g/dL  5.6   6.8    Albumin 3.5 - 5.0 g/dL 3.3   3.0   3.7    AST 15 - 41 U/L  108   239    ALT 0 - 44 U/L  49   60    Alk Phosphatase 38 - 126 U/L  92   106    Total Bilirubin 0.3 - 1.2 mg/dL  0.7   0.8         Latest Ref Rng & Units 08/16/2021    2:00 PM 08/11/2021    2:34 AM 08/10/2021    3:07 AM  CBC  WBC 4.0 - 10.5 K/uL 13.6   12.7   13.1    Hemoglobin 12.0 - 15.0 g/dL 13.8   12.4   13.0    Hematocrit 36.0 - 46.0 % 42.9   39.5   39.7    Platelets 150 - 400 K/uL 577   413   481       Current Medications, Allergies, Past Medical History, Past Surgical History, Family History and Social History were reviewed in  [employer] record.   Physical Exam: General: Well developed, well nourished, no acute distress Head: Normocephalic and atraumatic Eyes: Sclerae anicteric, EOMI Ears: Normal auditory acuity Mouth: Not examined, mask on during Covid-19 pandemic Lungs: Clear throughout to auscultation Heart: Regular rate and rhythm; no murmurs, rubs or bruits Abdomen: Soft, non tender and non distended. No masses, hepatosplenomegaly or hernias noted. Normal Bowel sounds Rectal: Not done Musculoskeletal: Symmetrical with no gross deformities  Pulses:  Normal pulses noted Extremities: No clubbing, cyanosis, edema or deformities noted Neurological: Alert oriented x 4, grossly nonfocal Psychological:  Alert and cooperative. Normal mood and affect   Beya Tipps T. Fuller Plan, MD 09/19/2021, 10:15 AM

## 2021-09-26 NOTE — Progress Notes (Signed)
ADVANCED HF CLINIC NOTE   Primary Care: [name], MD Primary Cardiologist: Dr. Marlou Porch HF Cardiologist: Dr. Haroldine Laws  HPI: [name] is a 81 y.o. with a h/o ulcerative colitis, CAD BMS prox LAD with 3 subsequent cath sshowing patent stent, arthritis, HFpEF, DMII, and fibromyalgia. Intolerant aspirin.    Found unresponsive by family, EMS called and intubated in ED. Hypotensive and bradycardiac. Started on NE, IVF and atropine.  CXR showed RML PNA, started on IV abx. Admitted to ICU, POCUS by Dr Haroldine Laws --->  Echo LV 55%. RV moderately reduced. Concern for sepsis +/- CGS, PNA in setting of underlying PAH/cor pulmonale. Underwent R/LHC showing mild to moderate non-obstructive CAD with 90% lesion in apical LAD, normal LV function and moderate PAH with preserved output. Needs eval of OSA/OHS. Pressors weaned off and patient extubated. Patient had atrial runs/PACs, and metoprolol switched to atenolol. Discharged home on 3L oxygen, weight 209 lbs.  Post hospital follow up 4/23, NYHA III, remained physically deconditioned, volume stable. Arranged for sleep study.  Today she returns for HF follow up with her son. Overall feeling good. Mobility improving with PT, uses rolling walker to get around. Not having to use oxygen as much during the day now, aiming to keep sats < 90%.  Wearing Trelogy at night. Denies palpitations, CP, dizziness, edema, or PND/Orthopnea. Appetite ok. No fever or chills. Weight at home 200-202 pounds. Taking all medications. Lives with son and daughter in law.  Cardiac Studies: - Ltd Echo (4/23): EF 35-40%, LV moderately down with global LV HK, moderate LVH moderate to severe TR  - R/LHC (4/23):   Dist LAD lesion is 90% stenosed.   Prox RCA lesion is 30% stenosed.   Dist RCA lesion is 30% stenosed.   Prox LAD to Mid LAD lesion is 30% stenosed.   The left ventricular ejection fraction is 55-65% by visual estimate.   Ao = 167/84 (118) LV = 152/11 RA =  8 RV =  64/11 PA = 68/26 (45) PCW = 15 Fick cardiac output/index = 6.0/3.0 PVR = 5.0 WU FA sat = 92% PA sat = 66%, 67%   1. Predominantly mild to moderate non-obstructive CAD with 90% lesion in apical LAD 2. Normal LV function 3. Moderate PAH  Past Medical History:  Diagnosis Date   Adenomatous colon polyp 02/2006   Allergy    Allergy, unspecified not elsewhere classified    Anemia    Anxiety    Arthritis    Asthma    Blood transfusion without reported diagnosis    Bronchiectasis    CAD (coronary artery disease)    BMS to the LAD, 2002; cath 12/07/11 patent LAD stent and mild nonobstructive disease, EF 65%; Medical management   Cataract    removed both eyes   CHF (congestive heart failure) (HCC)    Diastolic   Chronic diastolic CHF (congestive heart failure) ([place]) 11/25/2011   Clotting disorder ([place])    in legs per pt    Diabetes mellitus    Diverticulosis of colon    DJD (degenerative joint disease)    Fibromyalgia    GERD (gastroesophageal reflux disease)    H. pylori infection 05/12/2019   Hypercholesterolemia    Hypertension    Microscopic hematuria    Neoplasm of kidney    Obesity    Other diseases of lung, not elsewhere classified    Pinched vertebral nerve    Pulmonary nodule    Negative PET in 2010   Stress incontinence,  female    Syncope    Ulcerative colitis, left sided ([place]) 2008   Hx of   UTI (lower urinary tract infection)    Vitamin D deficiency disease    Current Outpatient Medications  Medication Sig Dispense Refill   acetaminophen (TYLENOL) 500 MG tablet Take 500 mg by mouth every 6 (six) hours as needed for headache (pain).      albuterol (PROVENTIL) (2.5 MG/3ML) 0.083% nebulizer solution Take 3 mLs (2.5 mg total) by nebulization every 4 (four) hours as needed for wheezing or shortness of breath. 75 mL 3   albuterol (VENTOLIN HFA) 108 (90 BASE) MCG/ACT inhaler Inhale 2 puffs into the lungs every 4 (four) hours as needed for wheezing or shortness of  breath. 18 each 11   ALPRAZolam (XANAX) 0.5 MG tablet Take 1 tablet (0.5 mg total) by mouth 3 (three) times daily as needed. 90 tablet 5   amLODipine (NORVASC) 10 MG tablet Take 1 tablet (10 mg total) by mouth daily. 90 tablet 2   atorvastatin (LIPITOR) 20 MG tablet TAKE 1 TABLET (20 MG TOTAL) BY MOUTH DAILY. PLEASE KEEP UPCOMING APPT FOR FUTURE REFILLS. 90 tablet 3   azelastine (ASTELIN) 0.1 % nasal spray Place 1 spray into both nostrils at bedtime.     Blood Glucose Monitoring Suppl (ONETOUCH VERIO FLEX SYSTEM) w/Device KIT 1 each by Does not apply route daily at 12 noon. 1 kit 0   Cholecalciferol (VITAMIN D-3) 5000 UNITS TABS Patient takes 1 tablet by mouth once a week on Mondays.     clopidogrel (PLAVIX) 75 MG tablet Take 1 tablet (75 mg total) by mouth daily. 90 tablet 3   diclofenac sodium (VOLTAREN) 1 % GEL Apply 2 g topically 4 (four) times daily as needed (Pain).     fluticasone (FLONASE) 50 MCG/ACT nasal spray PLACE 2 SPRAYS IN EACH NOSTRIL AT BEDTIME 16 g 6   gabapentin (NEURONTIN) 300 MG capsule Take 1 capsule (300 mg total) by mouth 2 (two) times daily. 60 capsule 5   glimepiride (AMARYL) 1 MG tablet Take 2 tablets (2 mg total) by mouth daily with breakfast. 90 tablet 3   glycopyrrolate (ROBINUL) 1 MG tablet Take 1 tablet (1 mg total) by mouth 3 (three) times daily. 3 tablet 0   HYDROcodone-acetaminophen (NORCO) 10-325 MG tablet Take 1 tablet by mouth every 6 (six) hours as needed for severe pain. 120 tablet 0   hydrOXYzine (VISTARIL) 25 MG capsule TAKE 1 CAPSULE (25 MG TOTAL) BY MOUTH EVERY 6 (SIX) HOURS AS NEEDED. 360 capsule 0   levocetirizine (XYZAL) 5 MG tablet Take 5 mg by mouth every evening.     LIDODERM 5 % APPLY 1 PATCH TO SKIN FOR 12 HOURS THEN REMOVE AND DISCARD PATCH WITHIN 72 HOURS OR AS DIRECTED 30 patch 2   loperamide (IMODIUM) 2 MG capsule Take 4-6 mg by mouth daily.     losartan (COZAAR) 25 MG tablet Take 1 tablet (25 mg total) by mouth daily. 30 tablet 0   magnesium  oxide (MAG-OX) 400 MG tablet Take 400 mg by mouth 3 (three) times daily.     mesalamine (LIALDA) 1.2 g EC tablet TAKE 2 TABLETS (2.4 G TOTAL) BY MOUTH 2 (TWO) TIMES DAILY. 360 tablet 1   metFORMIN (GLUCOPHAGE XR) 750 MG 24 hr tablet Take 1 tablet (750 mg total) by mouth daily with breakfast. 90 tablet 3   metoprolol succinate (TOPROL XL) 100 MG 24 hr tablet Take 1 tablet (100 mg total) by mouth  daily. Take with or immediately following a meal. 30 tablet 11   montelukast (SINGULAIR) 10 MG tablet Take one every morning 90 tablet 3   neomycin-polymyxin-hydrocortisone (CORTISPORIN) OTIC solution Place 4 drops into both ears 4 (four) times daily. (Patient taking differently: Place 4 drops into both ears 4 (four) times daily. As needed) 10 mL 2   nitroGLYCERIN (NITROSTAT) 0.4 MG SL tablet PLACE 1 TABLET (0.4 MG TOTAL) UNDER THE TONGUE EVERY 5 (FIVE) MINUTES AS NEEDED FOR CHEST PAIN. 75 tablet 1   Olopatadine HCl 0.7 % SOLN Place 1 drop into both eyes daily as needed (itching/allergies).     ondansetron (ZOFRAN-ODT) 4 MG disintegrating tablet TAKE 1 TABLET BY MOUTH EVERY 8 HOURS AS NEEDED FOR NAUSEA AND VOMITING 30 tablet 1   OneTouch Delica Lancets 58P MISC 1 each by Does not apply route daily at 12 noon. 100 each 0   ONETOUCH VERIO test strip USE TO CHECK BLOOD GLUCOSE ONCE DAILY 100 strip 1   Pancrelipase, Lip-Prot-Amyl, (ZENPEP) 40000-126000 units CPEP Take by mouth. Take 2 tablets by mouth before meals and 1 tablet before snacks     pantoprazole (PROTONIX) 40 MG tablet TAKE 1 TABLET BY MOUTH TWICE A DAY 180 tablet 0   Polyethyl Glycol-Propyl Glycol (SYSTANE OP) Place 1 drop into both eyes daily. For dry eyes     potassium chloride SA (KLOR-CON M) 20 MEQ tablet Take 1 tablet (20 mEq total) by mouth 2 (two) times daily. 60 tablet 11   sitaGLIPtin (JANUVIA) 100 MG tablet Take 1 tablet (100 mg total) by mouth daily. 90 tablet 3   tiZANidine (ZANAFLEX) 4 MG tablet TAKE 1 TABLET BY MOUTH TWICE A DAY 180  tablet 3   TOBRADEX ophthalmic solution Place 1 drop into both eyes 4 (four) times daily. As needed     torsemide 40 MG TABS Take 40 mg by mouth daily. 30 tablet 2   Current Facility-Administered Medications  Medication Dose Route Frequency Provider Last Rate Last Admin   ipratropium-albuterol (DUONEB) 0.5-2.5 (3) MG/3ML nebulizer solution 3 mL  3 mL Nebulization Once Nafziger, Tommi Rumps, NP        Allergies  Allergen Reactions   Doxycycline Other (See Comments)    Unsteady gait   Aspirin Nausea And Vomiting   Azithromycin Other (See Comments)    REACTION: pt states "ZPak doesn't work"   Caffeine Nausea And Vomiting   Ciprofloxacin Nausea And Vomiting    Tolerates levaquin   Codeine Nausea And Vomiting   Oxycodone Other (See Comments)    Pt stated, "upsets my stomach" (11/19/17)   Sulfamethizole Other (See Comments)    Unknown reaction   Tramadol Hcl Other (See Comments)    REACTION: pt states "spaced-out"   Social History   Socioeconomic History   Marital status: Divorced    Spouse name: Not on file   Number of children: 3   Years of education: Not on file   Highest education level: Not on file  Occupational History   Occupation: Retired    Fish farm manager: RETIRED  Tobacco Use   Smoking status: Former    Packs/day: 2.00    Years: 15.00    Pack years: 30.00    Types: Cigarettes    Quit date: 04/30/1973    Years since quitting: 48.4   Smokeless tobacco: Never   Tobacco comments:    ex-smoker: smoked for 10-15 years up to 2ppd, quit 1975.  Vaping Use   Vaping Use: Never used  Substance and Sexual  Activity   Alcohol use: Not Currently    Alcohol/week: 0.0 standard drinks    Comment: none now    Drug use: No   Sexual activity: Not Currently  Other Topics Concern   Not on file  Social History Narrative   Right handed   Social Determinants of Health   Financial Resource Strain: Low Risk    Difficulty of Paying Living Expenses: Not hard at all  Food Insecurity: No Food  Insecurity   Worried About Charity fundraiser in the Last Year: Never true   Ran Out of Food in the Last Year: Never true  Transportation Needs: No Transportation Needs   Lack of Transportation (Medical): No   Lack of Transportation (Non-Medical): No  Physical Activity: Inactive   Days of Exercise per Week: 0 days   Minutes of Exercise per Session: 0 min  Stress: No Stress Concern Present   Feeling of Stress : Not at all  Social Connections: Moderately Integrated   Frequency of Communication with Friends and Family: Three times a week   Frequency of Social Gatherings with Friends and Family: Three times a week   Attends Religious Services: More than 4 times per year   Active Member of Clubs or Organizations: Yes   Attends Archivist Meetings: More than 4 times per year   Marital Status: Widowed  Human resources officer Violence: Not At Risk   Fear of Current or Ex-Partner: No   Emotionally Abused: No   Physically Abused: No   Sexually Abused: No   Family History  Problem Relation Age of Onset   Pneumonia Father    Heart attack Father    Colon polyps Father    Parkinsonism Mother    Diabetes Brother    Sarcoidosis Brother    Hypertension Sister    Diabetes Sister    Breast cancer Daughter    Colon cancer Neg Hx    Esophageal cancer Neg Hx    Rectal cancer Neg Hx    Stomach cancer Neg Hx    BP (!) 146/94   Pulse 72   Wt 92.6 kg (204 lb 3.2 oz)   LMP  (LMP Unknown)   SpO2 91%   BMI 38.58 kg/m   Wt Readings from Last 3 Encounters:  09/27/21 92.6 kg (204 lb 3.2 oz)  09/19/21 91.6 kg (202 lb)  08/21/21 90.7 kg (200 lb)   PHYSICAL EXAM: General:  NAD. No resp difficulty, walked into clinic with rolling walker HEENT: Normal Neck: Supple. No JVD. Carotids 2+ bilat; no bruits. No lymphadenopathy or thryomegaly appreciated. Cor: PMI nondisplaced. Regular rate & rhythm. No rubs, gallops or murmurs. Lungs: Clear Abdomen: Obese, nontender, nondistended. No  hepatosplenomegaly. No bruits or masses. Good bowel sounds. Extremities: No cyanosis, clubbing, rash, edema Neuro: Alert & oriented x 3, cranial nerves grossly intact. Moves all 4 extremities w/o difficulty. Flat affect  ECG (personally reviewed): SR with PACs, 74 bpm  ASSESSMENT & PLAN: 1. Pulmonary HTN -> cor pulmonale - Marked RV strain on echo.  - Echo (4/23): EF45-50% RV dilated , septal flattening. Concern for RV failure/pulmonary HTN. Suspect preload dependent.  - RHC (4/23) c/w moderate PAH, Likely Group 3 PAH. R/L filling pressures not markedly high. - Will need eval for OSA/OHS. Now using NIV/Trelogy at home. - Needs weight loss. - If she has residual PAH, can try sildenafil. - Improved NYHA II-early III, limited by deconditioning and body habitus. Volume looks good on exam. - We discussed starting SGLT2i  today, she does not want to start any new meds yet. - Continue torsemide 40 mg daily. - Continue losartan 25 mg daily. Will hold off starting Entresto today as she has not had her AM meds yet. - Continue Toprol XL 100 mg daily. - Labs today.  2. CAD - BMS prox LAD with 3 subsequent caths showing patent stent - LHC (4/23) w/ mild to moderate non-obstructive CAD with 90% lesion in apical LAD - No s/s angina. - Continue medical management.  3. Chronic Hypoxic Respiratory Failure, with RML PNA - Completed abx.  - CXR 08/07/21 w/ improved bilateral lung volumes and ventilation - We discussed careful use of her xanax, gabapentin, oxycodone, hydroxyzine as it can lead to respiratory depression. - Continue home oxygen.   4. DM2 - On sitagliptin and glimepiride. - A1c 7.8 (2/23). - Consider SGLT2i use in future if she is agreeable (no hx of UTIs/yeast infections)   5. HTN - Mildly elevated today but has not had AM meds yet - Consider stopping losartan and starting Entresto next.  6. h/o Bradycardia  - Due to hyperkalemia. - Resolved.   7. PACs/? Short atrial runs -  Previously untreated OSA likely contributing. - Continue Toprol.  8. Physical Deconditioning - Improving. - Continue PT   Follow up in 2 months with Dr. Denice Paradise as scheduled.  Allena Katz, FNP-BC 09/27/21

## 2021-09-27 ENCOUNTER — Ambulatory Visit (HOSPITAL_COMMUNITY)
Admission: RE | Admit: 2021-09-27 | Discharge: 2021-09-27 | Disposition: A | Discharge status: Home / Self Care | Payer: PPO | Source: Ambulatory Visit | Attending: Family Medicine | Admitting: Family Medicine

## 2021-09-27 ENCOUNTER — Encounter (HOSPITAL_COMMUNITY): Payer: Self-pay

## 2021-09-27 VITALS — BP 146/94 | HR 72 | Wt 204.2 lb

## 2021-09-27 DIAGNOSIS — E875 Hyperkalemia: Secondary | ICD-10-CM | POA: Insufficient documentation

## 2021-09-27 DIAGNOSIS — I491 Atrial premature depolarization: Secondary | ICD-10-CM | POA: Diagnosis not present

## 2021-09-27 DIAGNOSIS — E119 Type 2 diabetes mellitus without complications: Secondary | ICD-10-CM | POA: Diagnosis not present

## 2021-09-27 DIAGNOSIS — Z955 Presence of coronary angioplasty implant and graft: Secondary | ICD-10-CM | POA: Insufficient documentation

## 2021-09-27 DIAGNOSIS — J9611 Chronic respiratory failure with hypoxia: Secondary | ICD-10-CM

## 2021-09-27 DIAGNOSIS — Z79899 Other long term (current) drug therapy: Secondary | ICD-10-CM | POA: Insufficient documentation

## 2021-09-27 DIAGNOSIS — G4733 Obstructive sleep apnea (adult) (pediatric): Secondary | ICD-10-CM | POA: Diagnosis not present

## 2021-09-27 DIAGNOSIS — I2729 Other secondary pulmonary hypertension: Secondary | ICD-10-CM | POA: Diagnosis not present

## 2021-09-27 DIAGNOSIS — I25119 Atherosclerotic heart disease of native coronary artery with unspecified angina pectoris: Secondary | ICD-10-CM

## 2021-09-27 DIAGNOSIS — I110 Hypertensive heart disease with heart failure: Secondary | ICD-10-CM | POA: Diagnosis not present

## 2021-09-27 DIAGNOSIS — I10 Essential (primary) hypertension: Secondary | ICD-10-CM

## 2021-09-27 DIAGNOSIS — I5032 Chronic diastolic (congestive) heart failure: Secondary | ICD-10-CM

## 2021-09-27 DIAGNOSIS — R001 Bradycardia, unspecified: Secondary | ICD-10-CM

## 2021-09-27 DIAGNOSIS — Z7984 Long term (current) use of oral hypoglycemic drugs: Secondary | ICD-10-CM | POA: Insufficient documentation

## 2021-09-27 DIAGNOSIS — I959 Hypotension, unspecified: Secondary | ICD-10-CM | POA: Insufficient documentation

## 2021-09-27 DIAGNOSIS — M797 Fibromyalgia: Secondary | ICD-10-CM | POA: Diagnosis not present

## 2021-09-27 DIAGNOSIS — I2510 Atherosclerotic heart disease of native coronary artery without angina pectoris: Secondary | ICD-10-CM | POA: Diagnosis not present

## 2021-09-27 DIAGNOSIS — R5381 Other malaise: Secondary | ICD-10-CM | POA: Diagnosis not present

## 2021-09-27 DIAGNOSIS — I272 Pulmonary hypertension, unspecified: Secondary | ICD-10-CM

## 2021-09-27 LAB — BASIC METABOLIC PANEL
Anion gap: 7 (ref 5–15)
BUN: 21 mg/dL (ref 8–23)
CO2: 28 mmol/L (ref 22–32)
Calcium: 9.4 mg/dL (ref 8.9–10.3)
Chloride: 102 mmol/L (ref 98–111)
Creatinine, Ser: 0.87 mg/dL (ref 0.44–1.00)
GFR, Estimated: >60 mL/min (ref >60)
Glucose, Bld: 135 mg/dL — ABNORMAL HIGH (ref 70–99)
Potassium: 4.2 mmol/L (ref 3.5–5.1)
Sodium: 137 mmol/L (ref 135–145)

## 2021-09-27 LAB — BRAIN NATRIURETIC PEPTIDE: B Natriuretic Peptide: 71.2 pg/mL (ref 0.0–100.0)

## 2021-09-27 NOTE — Patient Instructions (Signed)
It was great to see you today! No medication changes are needed at this time.  Labs today We will only contact you if something comes back abnormal or we need to make some changes. Otherwise no news is good news!  Keep cardiology follow up as scheduled  Do the following things EVERYDAY: Weigh yourself in the morning before breakfast. Write it down and keep it in a log. Take your medicines as prescribed Eat low salt foods--Limit salt (sodium) to 2000 mg per day.  Stay as active as you can everyday Limit all fluids for the day to less than 2 liters  At the [facility], you and your health needs are our priority. As part of our continuing mission to provide you with exceptional heart care, we have created designated Provider Care Teams. These Care Teams include your primary Cardiologist (physician) and Advanced Practice Providers (APPs- Physician Assistants and Nurse Practitioners) who all work together to provide you with the care you need, when you need it.   You may see any of the following providers on your designated Care Team at your next follow up: Dr Glori Bickers Dr Haynes Kerns, NP Lyda Jester, [place] [facility] [place], [place] Audry Riles, PharmD   Please be sure to bring in all your medications bottles to every appointment.   If you have any questions or concerns before your next appointment please send [place] a message through [place] or call our office at [phone].    TO LEAVE A MESSAGE FOR THE NURSE SELECT OPTION 2, PLEASE LEAVE A MESSAGE INCLUDING: YOUR NAME DATE OF BIRTH CALL BACK NUMBER REASON FOR CALL**this is important as we prioritize the call backs  YOU WILL RECEIVE A CALL BACK THE SAME DAY AS LONG AS YOU CALL BEFORE 4:00 PM

## 2021-09-28 DIAGNOSIS — E669 Obesity, unspecified: Secondary | ICD-10-CM | POA: Diagnosis not present

## 2021-09-28 DIAGNOSIS — E7800 Pure hypercholesterolemia, unspecified: Secondary | ICD-10-CM | POA: Diagnosis not present

## 2021-09-28 DIAGNOSIS — E119 Type 2 diabetes mellitus without complications: Secondary | ICD-10-CM | POA: Diagnosis not present

## 2021-09-28 DIAGNOSIS — I2723 Pulmonary hypertension due to lung diseases and hypoxia: Secondary | ICD-10-CM | POA: Diagnosis not present

## 2021-09-28 DIAGNOSIS — K219 Gastro-esophageal reflux disease without esophagitis: Secondary | ICD-10-CM | POA: Diagnosis not present

## 2021-09-28 DIAGNOSIS — G4733 Obstructive sleep apnea (adult) (pediatric): Secondary | ICD-10-CM | POA: Diagnosis not present

## 2021-09-28 DIAGNOSIS — I110 Hypertensive heart disease with heart failure: Secondary | ICD-10-CM | POA: Diagnosis not present

## 2021-09-28 DIAGNOSIS — J470 Bronchiectasis with acute lower respiratory infection: Secondary | ICD-10-CM | POA: Diagnosis not present

## 2021-09-28 DIAGNOSIS — H02402 Unspecified ptosis of left eyelid: Secondary | ICD-10-CM | POA: Diagnosis not present

## 2021-09-28 DIAGNOSIS — E559 Vitamin D deficiency, unspecified: Secondary | ICD-10-CM | POA: Diagnosis not present

## 2021-09-28 DIAGNOSIS — J189 Pneumonia, unspecified organism: Secondary | ICD-10-CM | POA: Diagnosis not present

## 2021-09-28 DIAGNOSIS — K5790 Diverticulosis of intestine, part unspecified, without perforation or abscess without bleeding: Secondary | ICD-10-CM | POA: Diagnosis not present

## 2021-09-28 DIAGNOSIS — I2510 Atherosclerotic heart disease of native coronary artery without angina pectoris: Secondary | ICD-10-CM | POA: Diagnosis not present

## 2021-09-28 DIAGNOSIS — M797 Fibromyalgia: Secondary | ICD-10-CM | POA: Diagnosis not present

## 2021-09-28 DIAGNOSIS — I5023 Acute on chronic systolic (congestive) heart failure: Secondary | ICD-10-CM | POA: Diagnosis not present

## 2021-09-28 DIAGNOSIS — K861 Other chronic pancreatitis: Secondary | ICD-10-CM | POA: Diagnosis not present

## 2021-09-28 DIAGNOSIS — J9601 Acute respiratory failure with hypoxia: Secondary | ICD-10-CM | POA: Diagnosis not present

## 2021-09-28 DIAGNOSIS — Z7902 Long term (current) use of antithrombotics/antiplatelets: Secondary | ICD-10-CM | POA: Diagnosis not present

## 2021-09-28 DIAGNOSIS — I2609 Other pulmonary embolism with acute cor pulmonale: Secondary | ICD-10-CM | POA: Diagnosis not present

## 2021-09-28 DIAGNOSIS — N289 Disorder of kidney and ureter, unspecified: Secondary | ICD-10-CM | POA: Diagnosis not present

## 2021-09-28 DIAGNOSIS — Z9981 Dependence on supplemental oxygen: Secondary | ICD-10-CM | POA: Diagnosis not present

## 2021-09-28 DIAGNOSIS — F419 Anxiety disorder, unspecified: Secondary | ICD-10-CM | POA: Diagnosis not present

## 2021-09-28 DIAGNOSIS — D649 Anemia, unspecified: Secondary | ICD-10-CM | POA: Diagnosis not present

## 2021-09-28 DIAGNOSIS — M5412 Radiculopathy, cervical region: Secondary | ICD-10-CM | POA: Diagnosis not present

## 2021-09-29 ENCOUNTER — Other Ambulatory Visit: Payer: Self-pay | Admitting: Gastroenterology

## 2021-10-03 ENCOUNTER — Encounter: Payer: Self-pay | Admitting: Family Medicine

## 2021-10-03 ENCOUNTER — Telehealth (INDEPENDENT_AMBULATORY_CARE_PROVIDER_SITE_OTHER): Payer: PPO | Admitting: Family Medicine

## 2021-10-03 DIAGNOSIS — F1190 Opioid use, unspecified, uncomplicated: Secondary | ICD-10-CM | POA: Diagnosis not present

## 2021-10-03 DIAGNOSIS — M5441 Lumbago with sciatica, right side: Secondary | ICD-10-CM

## 2021-10-03 DIAGNOSIS — M5442 Lumbago with sciatica, left side: Secondary | ICD-10-CM

## 2021-10-03 DIAGNOSIS — G8929 Other chronic pain: Secondary | ICD-10-CM | POA: Diagnosis not present

## 2021-10-03 MED ORDER — HYDROCODONE-ACETAMINOPHEN 10-325 MG PO TABS: 1.0000 | ORAL_TABLET | Freq: Four times a day (QID) | ORAL | 0 refills | Status: DC | PRN

## 2021-10-03 MED ORDER — HYDROCODONE-ACETAMINOPHEN 10-325 MG PO TABS: 1.0000 | ORAL_TABLET | Freq: Four times a day (QID) | ORAL | 0 refills | Status: AC | PRN

## 2021-10-03 MED ORDER — HYDROCODONE-ACETAMINOPHEN 10-325 MG PO TABS
1.0000 | ORAL_TABLET | Freq: Four times a day (QID) | ORAL | 0 refills | Status: DC | PRN
Start: 2021-11-02 — End: 2021-10-03

## 2021-10-03 NOTE — Progress Notes (Signed)
Subjective:    Patient ID: [name], female    DOB: 1940/10/21, 81 y.o.   MRN: [id number]  HPI Virtual Visit via Telephone Note  I connected with the patient on 10/03/21 at  2:00 PM EDT by telephone and verified that I am speaking with the correct person using two identifiers.   I discussed the limitations, risks, security and privacy concerns of performing an evaluation and management service by telephone and the availability of in person appointments. I also discussed with the patient that there may be a patient responsible charge related to this service. The patient expressed understanding and agreed to proceed.  Location patient: home Location provider: work or home office Participants present for the call: patient, provider Patient did not have a visit in the prior 7 days to address this/these issue(s).   History of Present Illness: Here for pain management, she is doing well. She has no new concerns.    Observations/Objective: Patient sounds cheerful and well on the phone. I do not appreciate any SOB. Speech and thought processing are grossly intact. Patient reported vitals:  Assessment and Plan: Pain management. Indication for chronic opioid: low back pain  Medication and dose: Norco 10-325 # pills per month: 120 Last UDS date: 03-10-21 Opioid Treatment Agreement signed (Y/N): 10-28-18 Opioid Treatment Agreement last reviewed with patient:  10-03-21 [facility] reviewed this encounter (include red flags): Yes Meds were refilled.  Alysia Penna, MD   Follow Up Instructions:     [phone] 5-10 [phone] 11-20 [id number] 21-30 I did not refer this patient for an OV in the next 24 hours for this/these issue(s).  I discussed the assessment and treatment plan with the patient. The patient was provided an opportunity to ask questions and all were answered. The patient agreed with the plan and demonstrated an understanding of the instructions.   The patient was advised to call back  or seek an in-person evaluation if the symptoms worsen or if the condition fails to improve as anticipated.  I provided 12 minutes of non-face-to-face time during this encounter.   Alysia Penna, MD     Review of Systems     Objective:   Physical Exam        Assessment & Plan:

## 2021-10-14 ENCOUNTER — Other Ambulatory Visit: Payer: Self-pay | Admitting: Family Medicine

## 2021-10-17 DIAGNOSIS — J309 Allergic rhinitis, unspecified: Secondary | ICD-10-CM | POA: Diagnosis not present

## 2021-10-17 DIAGNOSIS — K219 Gastro-esophageal reflux disease without esophagitis: Secondary | ICD-10-CM | POA: Diagnosis not present

## 2021-10-17 DIAGNOSIS — J4540 Moderate persistent asthma, uncomplicated: Secondary | ICD-10-CM | POA: Diagnosis not present

## 2021-10-27 ENCOUNTER — Other Ambulatory Visit: Payer: Self-pay | Admitting: Gastroenterology

## 2021-10-27 MED ORDER — DIPHENOXYLATE-ATROPINE 2.5-0.025 MG PO TABS: ORAL_TABLET | ORAL | 0 refills | Status: DC

## 2021-10-27 NOTE — Addendum Note (Signed)
Addended by: Dorisann Frames L on: 10/27/2021 12:38 PM   Modules accepted: Orders

## 2021-10-28 ENCOUNTER — Other Ambulatory Visit: Payer: Self-pay | Admitting: Family Medicine

## 2021-10-28 ENCOUNTER — Other Ambulatory Visit: Payer: Self-pay | Admitting: Cardiology

## 2021-10-28 DIAGNOSIS — J189 Pneumonia, unspecified organism: Secondary | ICD-10-CM | POA: Diagnosis not present

## 2021-10-28 DIAGNOSIS — J9601 Acute respiratory failure with hypoxia: Secondary | ICD-10-CM | POA: Diagnosis not present

## 2021-10-28 DIAGNOSIS — J984 Other disorders of lung: Secondary | ICD-10-CM | POA: Diagnosis not present

## 2021-10-28 DIAGNOSIS — I2609 Other pulmonary embolism with acute cor pulmonale: Secondary | ICD-10-CM | POA: Diagnosis not present

## 2021-11-05 ENCOUNTER — Other Ambulatory Visit: Payer: Self-pay | Admitting: Family Medicine

## 2021-11-06 ENCOUNTER — Other Ambulatory Visit: Payer: Self-pay | Admitting: Gastroenterology

## 2021-11-06 NOTE — Telephone Encounter (Signed)
Last refill Xanax- 03/10/21-90 tabs, 5 refills Last VV- 10/03/2021  No future OV scheduled.

## 2021-11-08 ENCOUNTER — Other Ambulatory Visit: Payer: Self-pay | Admitting: Family Medicine

## 2021-11-12 ENCOUNTER — Other Ambulatory Visit: Payer: Self-pay | Admitting: Gastroenterology

## 2021-11-15 ENCOUNTER — Telehealth: Payer: Self-pay | Admitting: Pharmacist

## 2021-11-15 NOTE — Chronic Care Management (AMB) (Signed)
Chronic Care Management Pharmacy Assistant   Name: [name]  MRN: [id number] DOB: 19-Jul-1940  Reason for Encounter: Disease State   Conditions to be addressed/monitored: DMII  Recent office visits:  10/03/21 [name], MD - Patient presented via video visit for Chronic narcotic use and other concerns. Prescribed Hydrocodone-Acetaminophen PRN.   08/21/21 [name], MD - Patient presented for Acute HFrEF and other concerns. Prescribed Glycopyrrolate. Prescribed Potassium Chloride Crys ER 20 MEQ. Stopped Amylase- Lipase-Protease.  Recent consult visits:  10/18/21 Darrick Grinder. Harold Hedge - Patient presented for Allergic Rhinitis and other concerns. No medication changes.  10/17/21 Darrick Grinder. Harold Hedge - Patient presented for GERD and other concerns. No medication changes.  09/27/21 MilfordMaricela Bo, FNP (Cardiology) - Patient presented to [facility] for Chronic diastolic heart failure and other concerns.  09/19/21 Ladene Artist, MD  Gertie Fey) - Patient presented for Left sided ulcerative colitis and other concerns. No medication changes.  Hospital visits:  Medication Reconciliation was completed by comparing discharge summary, patient's EMR and Pharmacy list, and upon discussion with patient.   Patient presented to [facility] on 08/03/21 due to Cardiogenic shock. Patient was present for 8 days.   New?Medications Started at [facility] Discharge:?? -started  gabapentin (NEURONTIN) metoprolol succinate (Toprol XL) Torsemide   Medication Changes at Hospital Discharge: -Changed  losartan (COZAAR   Medications Discontinued at Hospital Discharge: -Stopped  atenolol 50 MG tablet (TENORMIN) diphenoxylate-atropine 2.5-0.025 MG tablet (LOMOTIL) furosemide 40 MG tablet (LASIX) gabapentin 600 MG tablet (NEURONTIN) glycopyrrolate 1 MG tablet (ROBINUL) isosorbide mononitrate 60 MG 24 hr tablet (IMDUR) Klor-Con M20 20 MEQ tablet  (potassium chloride SA) nabumetone 750 MG tablet (RELAFEN)   Medications that remain the same after Hospital Discharge:??  -All other medications will remain the same.      Medications: Outpatient Encounter Medications as of 11/15/2021  Medication Sig   acetaminophen (TYLENOL) 500 MG tablet Take 500 mg by mouth every 6 (six) hours as needed for headache (pain).    albuterol (PROVENTIL) (2.5 MG/3ML) 0.083% nebulizer solution Take 3 mLs (2.5 mg total) by nebulization every 4 (four) hours as needed for wheezing or shortness of breath.   albuterol (VENTOLIN HFA) 108 (90 BASE) MCG/ACT inhaler Inhale 2 puffs into the lungs every 4 (four) hours as needed for wheezing or shortness of breath.   ALPRAZolam (XANAX) 0.5 MG tablet TAKE 1 TABLET BY MOUTH 3 TIMES DAILY AS NEEDED.   amLODipine (NORVASC) 10 MG tablet TAKE 1 TABLET BY MOUTH EVERY DAY   atorvastatin (LIPITOR) 20 MG tablet TAKE 1 TABLET (20 MG TOTAL) BY MOUTH DAILY. PLEASE KEEP UPCOMING APPT FOR FUTURE REFILLS.   azelastine (ASTELIN) 0.1 % nasal spray Place 1 spray into both nostrils at bedtime.   Blood Glucose Monitoring Suppl (ONETOUCH VERIO FLEX SYSTEM) w/Device KIT 1 each by Does not apply route daily at 12 noon.   Cholecalciferol (VITAMIN D-3) 5000 UNITS TABS Patient takes 1 tablet by mouth once a week on Mondays.   clopidogrel (PLAVIX) 75 MG tablet Take 1 tablet (75 mg total) by mouth daily.   diclofenac sodium (VOLTAREN) 1 % GEL Apply 2 g topically 4 (four) times daily as needed (Pain).   diphenoxylate-atropine (LOMOTIL) 2.5-0.025 MG tablet TAKE 1 TABLET BY MOUTH TWICE A DAY AS NEEDED DIARRHEA OR LOOSE STOOL   fluticasone (FLONASE) 50 MCG/ACT nasal spray PLACE 2 SPRAYS IN EACH NOSTRIL AT BEDTIME   gabapentin (NEURONTIN) 300 MG capsule Take  1 capsule (300 mg total) by mouth 2 (two) times daily.   glimepiride (AMARYL) 1 MG tablet Take 2 tablets (2 mg total) by mouth daily with breakfast.   glycopyrrolate (ROBINUL) 1 MG tablet Take 1 tablet  (1 mg total) by mouth 3 (three) times daily.   [START ON 12/03/2021] HYDROcodone-acetaminophen (NORCO) 10-325 MG tablet Take 1 tablet by mouth every 6 (six) hours as needed for moderate pain.   hydrOXYzine (VISTARIL) 25 MG capsule TAKE 1 CAPSULE (25 MG TOTAL) BY MOUTH EVERY 6 (SIX) HOURS AS NEEDED.   levocetirizine (XYZAL) 5 MG tablet Take 5 mg by mouth every evening.   LIDODERM 5 % APPLY 1 PATCH TO SKIN FOR 12 HOURS THEN REMOVE AND DISCARD PATCH WITHIN 72 HOURS OR AS DIRECTED   loperamide (IMODIUM) 2 MG capsule Take 4-6 mg by mouth daily.   losartan (COZAAR) 25 MG tablet Take 1 tablet (25 mg total) by mouth daily.   magnesium oxide (MAG-OX) 400 MG tablet Take 400 mg by mouth 3 (three) times daily.   mesalamine (LIALDA) 1.2 g EC tablet TAKE 2 TABLETS (2.4 G TOTAL) BY MOUTH 2 (TWO) TIMES DAILY.   metFORMIN (GLUCOPHAGE-XR) 750 MG 24 hr tablet TAKE 1 TABLET BY MOUTH EVERY DAY WITH BREAKFAST   metoprolol succinate (TOPROL XL) 100 MG 24 hr tablet Take 1 tablet (100 mg total) by mouth daily. Take with or immediately following a meal.   montelukast (SINGULAIR) 10 MG tablet Take one every morning   neomycin-polymyxin-hydrocortisone (CORTISPORIN) OTIC solution Place 4 drops into both ears 4 (four) times daily. (Patient taking differently: Place 4 drops into both ears 4 (four) times daily. As needed)   nitroGLYCERIN (NITROSTAT) 0.4 MG SL tablet PLACE 1 TABLET (0.4 MG TOTAL) UNDER THE TONGUE EVERY 5 (FIVE) MINUTES AS NEEDED FOR CHEST PAIN.   Olopatadine HCl 0.7 % SOLN Place 1 drop into both eyes daily as needed (itching/allergies).   ondansetron (ZOFRAN-ODT) 4 MG disintegrating tablet TAKE 1 TABLET BY MOUTH EVERY 8 HOURS AS NEEDED FOR NAUSEA AND VOMITING   OneTouch Delica Lancets 10F MISC 1 each by Does not apply route daily at 12 noon.   ONETOUCH VERIO test strip USE TO CHECK BLOOD GLUCOSE ONCE DAILY   Pancrelipase, Lip-Prot-Amyl, (ZENPEP) 40000-126000 units CPEP Take by mouth. Take 2 tablets by mouth before  meals and 1 tablet before snacks   pantoprazole (PROTONIX) 40 MG tablet TAKE 1 TABLET BY MOUTH TWICE A DAY   Polyethyl Glycol-Propyl Glycol (SYSTANE OP) Place 1 drop into both eyes daily. For dry eyes   potassium chloride SA (KLOR-CON M) 20 MEQ tablet Take 1 tablet (20 mEq total) by mouth 2 (two) times daily.   sitaGLIPtin (JANUVIA) 100 MG tablet Take 1 tablet (100 mg total) by mouth daily.   tiZANidine (ZANAFLEX) 4 MG tablet TAKE 1 TABLET BY MOUTH TWICE A DAY   TOBRADEX ophthalmic solution Place 1 drop into both eyes 4 (four) times daily. As needed   torsemide (DEMADEX) 20 MG tablet TAKE 2 TABLETS DAILY   torsemide 40 MG TABS Take 40 mg by mouth daily.   Facility-Administered Encounter Medications as of 11/15/2021  Medication   ipratropium-albuterol (DUONEB) 0.5-2.5 (3) MG/3ML nebulizer solution 3 mL   Recent Relevant Labs: Lab Results  Component Value Date/Time   HGBA1C 7.8 (H) 06/16/2021 11:26 AM   HGBA1C 7.7 (H) 07/18/2020 11:43 AM    Kidney Function Lab Results  Component Value Date/Time   CREATININE 0.87 09/27/2021 09:30 AM   CREATININE 0.66  08/16/2021 02:00 PM   GFR 83.44 06/16/2021 11:26 AM   GFRNONAA >60 09/27/2021 09:30 AM   GFRAA 102 09/08/2019 11:34 AM    Current antihyperglycemic regimen:  Glimepiride 1 mg 2 tablet daily  Metformin 1000 mg 1 tablet twice daily sitaGLIPtin (JANUVIA) 100 MG tablet What recent interventions/DTPs have been made to improve glycemic control:  Patient reports no changes Have there been any recent hospitalizations or ED visits since last visit with CPP? Yes Patient denies hypoglycemic symptoms, including None Patient denies hyperglycemic symptoms, including none How often are you checking your blood sugar? 3-4 times daily What are your blood sugars ranging?  Son reports he is checking for her several times during the day he reports on the higher end she was at 175 one afternoon at 1:30 and another day last week was lower around 76. He  reports she sometimes has a snack in the evening but not always. Patient reports she has for breakfast everyday frosted flakes a banana and a cup of 50% sugar orange juice, other meals throughout the day vary. Patient considering switching frosted flakes for unfrosted but thinks they are bland. Patient reports she is still using her rollator at home and her oxygen, goes out to dinner every so often with the smaller tank, is still adjusting to using it but understands she needs to and will. Patient also goes out with her daughter to get her hair done every other week as she still is unable to drive her car and looks forward to that time. She also reports her weight has not changed is checking at home. She reports she is uninterested in a follow up appointment with Pharmacist at this time.   Adherence Review: Is the patient currently on a STATIN medication? Yes Is the patient currently on ACE/ARB medication? No Does the patient have >5 day gap between last estimated fill dates? No    Care Gaps: Zoster Vaccine - Overdue Foot Exam - Overdue COVID Booster - Overdue Eye Exam - Overdue Colonoscopy - Postponed  TDAP - Postponed Hepatitis C - Postponed BP- 146/94 09/27/21 AWV- 1/23 CCM- Declined at this time Lab Results  Component Value Date   HGBA1C 7.8 (H) 06/16/2021    Star Rating Drugs: Metformin (Glucophage) 750 mg - Last filled 10/30/21 90 [facility] at [facility]  Atorvastatin (Lipitor) 20 mg - Last filled 10/19/21 90 [facility] at [facility] Glimepiride ( Amaryl)  1 mg - Last filled 09/30/21 90 DS at [facility]  Losartan (Cozaar) 50 mg - Last filled 09/04/21 90 DS at [place] Pharmacist Assistant [phone]

## 2021-11-22 ENCOUNTER — Ambulatory Visit (HOSPITAL_COMMUNITY)
Admission: RE | Admit: 2021-11-22 | Discharge: 2021-11-22 | Disposition: A | Discharge status: Home / Self Care | Payer: PPO | Source: Ambulatory Visit | Attending: Internal Medicine | Admitting: Internal Medicine

## 2021-11-22 ENCOUNTER — Encounter (HOSPITAL_COMMUNITY): Payer: Self-pay | Admitting: Internal Medicine

## 2021-11-22 VITALS — BP 128/80 | HR 61 | Wt 207.0 lb

## 2021-11-22 DIAGNOSIS — J9611 Chronic respiratory failure with hypoxia: Secondary | ICD-10-CM | POA: Insufficient documentation

## 2021-11-22 DIAGNOSIS — Z7901 Long term (current) use of anticoagulants: Secondary | ICD-10-CM | POA: Diagnosis not present

## 2021-11-22 DIAGNOSIS — M1990 Unspecified osteoarthritis, unspecified site: Secondary | ICD-10-CM | POA: Diagnosis not present

## 2021-11-22 DIAGNOSIS — Z79899 Other long term (current) drug therapy: Secondary | ICD-10-CM | POA: Diagnosis not present

## 2021-11-22 DIAGNOSIS — I5020 Unspecified systolic (congestive) heart failure: Secondary | ICD-10-CM | POA: Diagnosis not present

## 2021-11-22 DIAGNOSIS — I10 Essential (primary) hypertension: Secondary | ICD-10-CM

## 2021-11-22 DIAGNOSIS — I5032 Chronic diastolic (congestive) heart failure: Secondary | ICD-10-CM | POA: Insufficient documentation

## 2021-11-22 DIAGNOSIS — M797 Fibromyalgia: Secondary | ICD-10-CM | POA: Diagnosis not present

## 2021-11-22 DIAGNOSIS — I110 Hypertensive heart disease with heart failure: Secondary | ICD-10-CM | POA: Diagnosis not present

## 2021-11-22 DIAGNOSIS — Z7984 Long term (current) use of oral hypoglycemic drugs: Secondary | ICD-10-CM | POA: Insufficient documentation

## 2021-11-22 DIAGNOSIS — E119 Type 2 diabetes mellitus without complications: Secondary | ICD-10-CM | POA: Diagnosis not present

## 2021-11-22 DIAGNOSIS — I2510 Atherosclerotic heart disease of native coronary artery without angina pectoris: Secondary | ICD-10-CM

## 2021-11-22 DIAGNOSIS — G4733 Obstructive sleep apnea (adult) (pediatric): Secondary | ICD-10-CM

## 2021-11-22 DIAGNOSIS — I272 Pulmonary hypertension, unspecified: Secondary | ICD-10-CM | POA: Diagnosis not present

## 2021-11-22 DIAGNOSIS — Z9981 Dependence on supplemental oxygen: Secondary | ICD-10-CM | POA: Diagnosis not present

## 2021-11-22 DIAGNOSIS — Z7902 Long term (current) use of antithrombotics/antiplatelets: Secondary | ICD-10-CM | POA: Insufficient documentation

## 2021-11-22 DIAGNOSIS — Z9989 Dependence on other enabling machines and devices: Secondary | ICD-10-CM | POA: Diagnosis not present

## 2021-11-22 DIAGNOSIS — E1136 Type 2 diabetes mellitus with diabetic cataract: Secondary | ICD-10-CM | POA: Insufficient documentation

## 2021-11-22 LAB — BASIC METABOLIC PANEL
Anion gap: 8 (ref 5–15)
BUN: 17 mg/dL (ref 8–23)
CO2: 27 mmol/L (ref 22–32)
Calcium: 9.4 mg/dL (ref 8.9–10.3)
Chloride: 102 mmol/L (ref 98–111)
Creatinine, Ser: 0.85 mg/dL (ref 0.44–1.00)
GFR, Estimated: >60 mL/min (ref >60)
Glucose, Bld: 133 mg/dL — ABNORMAL HIGH (ref 70–99)
Potassium: 4 mmol/L (ref 3.5–5.1)
Sodium: 137 mmol/L (ref 135–145)

## 2021-11-22 NOTE — Progress Notes (Addendum)
ADVANCED HF CLINIC NOTE   Primary Care: [name], MD Primary Cardiologist: Dr. Marlou Porch HF Cardiologist: Dr. Haroldine Laws  HPI: Ms [name] is a 81 y.o. with a h/o ulcerative colitis, CAD s/p BMS prox LAD with 3 subsequent cath showing patent stent, arthritis, HFpEF, DMII, and fibromyalgia. Intolerant to aspirin.    Found unresponsive by family in April 2023, EMS called and intubated in ED. Hypotensive and bradycardiac. Started on NE, IVF and atropine.  She was treated for RML PNA. Admitted to ICU.  Concern for sepsis +/- CGS, PNA in setting of underlying PAH/cor pulmonale and untreated OSA. POCUS -> Echo LVEF 55%, RV moderately reduced. R/LHC mild to moderate non-obstructive CAD with 90% lesion in apical LAD, normal LV function and moderate PAH with preserved output.  Pressors weaned off and patient extubated. Discharged home on 3L oxygen, weight 209 lbs. Discharged home with Trilogy BiPAP machine.  She is here today for 2 month follow-up.  She is accompanied by her son who assists with the history. Patient ambulates with a walker. Her son monitors her O2 sats regularly and has her wear O2 PRN for sats < 90%. Denies dyspnea, orthopnea, PND or lower extremity edema. Home weight stable between 204-207 lb. Wearing BiPAP night. Family reports she's had more energy since starting BiPAP. Taking medications as prescribed, her son assists her with them.  Cardiac Studies: - Ltd Echo (4/23): EF 35-40%, LV moderately down with global LV HK, moderate LVH moderate to severe TR  - R/LHC (4/23):   Dist LAD lesion is 90% stenosed.   Prox RCA lesion is 30% stenosed.   Dist RCA lesion is 30% stenosed.   Prox LAD to Mid LAD lesion is 30% stenosed.   The left ventricular ejection fraction is 55-65% by visual estimate.   Ao = 167/84 (118) LV = 152/11 RA =  8 RV = 64/11 PA = 68/26 (45) PCW = 15 Fick cardiac output/index = 6.0/3.0 PVR = 5.0 WU FA sat = 92% PA sat = 66%, 67%   1. Predominantly mild  to moderate non-obstructive CAD with 90% lesion in apical LAD 2. Normal LV function 3. Moderate PAH  Past Medical History:  Diagnosis Date   Adenomatous colon polyp 02/2006   Allergy    Allergy, unspecified not elsewhere classified    Anemia    Anxiety    Arthritis    Asthma    Blood transfusion without reported diagnosis    Bronchiectasis    CAD (coronary artery disease)    BMS to the LAD, 2002; cath 12/07/11 patent LAD stent and mild nonobstructive disease, EF 65%; Medical management   Cataract    removed both eyes   CHF (congestive heart failure) (HCC)    Diastolic   Chronic diastolic CHF (congestive heart failure) ([place]) 11/25/2011   Clotting disorder ([place])    in legs per pt    Diabetes mellitus    Diverticulosis of colon    DJD (degenerative joint disease)    Fibromyalgia    GERD (gastroesophageal reflux disease)    H. pylori infection 05/12/2019   Hypercholesterolemia    Hypertension    Microscopic hematuria    Neoplasm of kidney    Obesity    Other diseases of lung, not elsewhere classified    Pinched vertebral nerve    Pulmonary nodule    Negative PET in 2010   Stress incontinence, female    Syncope    Ulcerative colitis, left sided ([place]) 2008   Hx  of   UTI (lower urinary tract infection)    Vitamin D deficiency disease    Current Outpatient Medications  Medication Sig Dispense Refill   acetaminophen (TYLENOL) 500 MG tablet Take 500 mg by mouth every 6 (six) hours as needed for headache (pain).      albuterol (PROVENTIL) (2.5 MG/3ML) 0.083% nebulizer solution Take 3 mLs (2.5 mg total) by nebulization every 4 (four) hours as needed for wheezing or shortness of breath. 75 mL 3   albuterol (VENTOLIN HFA) 108 (90 BASE) MCG/ACT inhaler Inhale 2 puffs into the lungs every 4 (four) hours as needed for wheezing or shortness of breath. 18 each 11   ALPRAZolam (XANAX) 0.5 MG tablet TAKE 1 TABLET BY MOUTH 3 TIMES DAILY AS NEEDED. 90 tablet 5   amLODipine (NORVASC) 10 MG  tablet TAKE 1 TABLET BY MOUTH EVERY DAY 90 tablet 3   atorvastatin (LIPITOR) 20 MG tablet TAKE 1 TABLET (20 MG TOTAL) BY MOUTH DAILY. PLEASE KEEP UPCOMING APPT FOR FUTURE REFILLS. 90 tablet 3   azelastine (ASTELIN) 0.1 % nasal spray Place 1 spray into both nostrils at bedtime.     Blood Glucose Monitoring Suppl (ONETOUCH VERIO FLEX SYSTEM) w/Device KIT 1 each by Does not apply route daily at 12 noon. 1 kit 0   Cholecalciferol (VITAMIN D-3) 5000 UNITS TABS Patient takes 1 tablet by mouth once a week on Mondays.     clopidogrel (PLAVIX) 75 MG tablet Take 1 tablet (75 mg total) by mouth daily. 90 tablet 3   diclofenac sodium (VOLTAREN) 1 % GEL Apply 2 g topically 4 (four) times daily as needed (Pain).     diphenoxylate-atropine (LOMOTIL) 2.5-0.025 MG tablet TAKE 1 TABLET BY MOUTH TWICE A DAY AS NEEDED DIARRHEA OR LOOSE STOOL 28 tablet 0   fluticasone (FLONASE) 50 MCG/ACT nasal spray PLACE 2 SPRAYS IN EACH NOSTRIL AT BEDTIME 16 g 6   gabapentin (NEURONTIN) 300 MG capsule Take 1 capsule (300 mg total) by mouth 2 (two) times daily. 60 capsule 5   glimepiride (AMARYL) 1 MG tablet Take 2 tablets (2 mg total) by mouth daily with breakfast. 90 tablet 3   glycopyrrolate (ROBINUL) 1 MG tablet Take 1 tablet (1 mg total) by mouth 3 (three) times daily. 3 tablet 0   [START ON 12/03/2021] HYDROcodone-acetaminophen (NORCO) 10-325 MG tablet Take 1 tablet by mouth every 6 (six) hours as needed for moderate pain. 120 tablet 0   hydrOXYzine (VISTARIL) 25 MG capsule TAKE 1 CAPSULE (25 MG TOTAL) BY MOUTH EVERY 6 (SIX) HOURS AS NEEDED. 360 capsule 0   levocetirizine (XYZAL) 5 MG tablet Take 5 mg by mouth every evening.     LIDODERM 5 % APPLY 1 PATCH TO SKIN FOR 12 HOURS THEN REMOVE AND DISCARD PATCH WITHIN 72 HOURS OR AS DIRECTED 30 patch 2   loperamide (IMODIUM) 2 MG capsule Take 4-6 mg by mouth daily.     losartan (COZAAR) 25 MG tablet Take 1 tablet (25 mg total) by mouth daily. 30 tablet 0   magnesium oxide (MAG-OX) 400  MG tablet Take 400 mg by mouth 3 (three) times daily.     mesalamine (LIALDA) 1.2 g EC tablet TAKE 2 TABLETS (2.4 G TOTAL) BY MOUTH 2 (TWO) TIMES DAILY. 360 tablet 1   metFORMIN (GLUCOPHAGE-XR) 750 MG 24 hr tablet TAKE 1 TABLET BY MOUTH EVERY DAY WITH BREAKFAST 90 tablet 0   metoprolol succinate (TOPROL XL) 100 MG 24 hr tablet Take 1 tablet (100 mg total) by  mouth daily. Take with or immediately following a meal. 30 tablet 11   montelukast (SINGULAIR) 10 MG tablet Take one every morning 90 tablet 3   neomycin-polymyxin-hydrocortisone (CORTISPORIN) OTIC solution Place 4 drops into both ears 4 (four) times daily. 10 mL 2   nitroGLYCERIN (NITROSTAT) 0.4 MG SL tablet PLACE 1 TABLET (0.4 MG TOTAL) UNDER THE TONGUE EVERY 5 (FIVE) MINUTES AS NEEDED FOR CHEST PAIN. 75 tablet 1   Olopatadine HCl 0.7 % SOLN Place 1 drop into both eyes daily as needed (itching/allergies).     ondansetron (ZOFRAN-ODT) 4 MG disintegrating tablet TAKE 1 TABLET BY MOUTH EVERY 8 HOURS AS NEEDED FOR NAUSEA AND VOMITING 30 tablet 1   OneTouch Delica Lancets 29F MISC 1 each by Does not apply route daily at 12 noon. 100 each 0   ONETOUCH VERIO test strip USE TO CHECK BLOOD GLUCOSE ONCE DAILY 100 strip 1   Pancrelipase, Lip-Prot-Amyl, (ZENPEP) 40000-126000 units CPEP Take by mouth. Take 2 tablets by mouth before meals and 1 tablet before snacks     pantoprazole (PROTONIX) 40 MG tablet TAKE 1 TABLET BY MOUTH TWICE A DAY 180 tablet 0   Polyethyl Glycol-Propyl Glycol (SYSTANE OP) Place 1 drop into both eyes daily. For dry eyes     potassium chloride SA (KLOR-CON M) 20 MEQ tablet Take 1 tablet (20 mEq total) by mouth 2 (two) times daily. 60 tablet 11   sitaGLIPtin (JANUVIA) 100 MG tablet Take 1 tablet (100 mg total) by mouth daily. 90 tablet 3   tiZANidine (ZANAFLEX) 4 MG tablet TAKE 1 TABLET BY MOUTH TWICE A DAY 180 tablet 3   TOBRADEX ophthalmic solution Place 1 drop into both eyes 4 (four) times daily. As needed     torsemide (DEMADEX)  20 MG tablet TAKE 2 TABLETS DAILY 180 tablet 3   Current Facility-Administered Medications  Medication Dose Route Frequency Provider Last Rate Last Admin   ipratropium-albuterol (DUONEB) 0.5-2.5 (3) MG/3ML nebulizer solution 3 mL  3 mL Nebulization Once Nafziger, Tommi Rumps, NP        Allergies  Allergen Reactions   Doxycycline Other (See Comments)    Unsteady gait   Aspirin Nausea And Vomiting   Azithromycin Other (See Comments)    REACTION: pt states "ZPak doesn't work"   Caffeine Nausea And Vomiting   Ciprofloxacin Nausea And Vomiting    Tolerates levaquin   Codeine Nausea And Vomiting   Oxycodone Other (See Comments)    Pt stated, "upsets my stomach" (11/19/17)   Sulfamethizole Other (See Comments)    Unknown reaction   Tramadol Hcl Other (See Comments)    REACTION: pt states "spaced-out"   Social History   Socioeconomic History   Marital status: Divorced    Spouse name: Not on file   Number of children: 3   Years of education: Not on file   Highest education level: Not on file  Occupational History   Occupation: Retired    Fish farm manager: RETIRED  Tobacco Use   Smoking status: Former    Packs/day: 2.00    Years: 15.00    Total pack years: 30.00    Types: Cigarettes    Quit date: 04/30/1973    Years since quitting: 48.5   Smokeless tobacco: Never   Tobacco comments:    ex-smoker: smoked for 10-15 years up to 2ppd, quit 1975.  Vaping Use   Vaping Use: Never used  Substance and Sexual Activity   Alcohol use: Not Currently    Alcohol/week: 0.0 standard drinks  of alcohol    Comment: none now    Drug use: No   Sexual activity: Not Currently  Other Topics Concern   Not on file  Social History Narrative   Right handed   Social Determinants of Health   Financial Resource Strain: Low Risk  (05/22/2021)   Overall Financial Resource Strain (CARDIA)    Difficulty of Paying Living Expenses: Not hard at all  Food Insecurity: No Food Insecurity (05/22/2021)   Hunger Vital Sign     Worried About Running Out of Food in the Last Year: Never true    Ran Out of Food in the Last Year: Never true  Transportation Needs: No Transportation Needs (05/22/2021)   PRAPARE - Hydrologist (Medical): No    Lack of Transportation (Non-Medical): No  Physical Activity: Inactive (05/22/2021)   Exercise Vital Sign    Days of Exercise per Week: 0 days    Minutes of Exercise per Session: 0 min  Stress: No Stress Concern Present (05/22/2021)   [place]    Feeling of Stress : Not at all  Social Connections: Moderately Integrated (05/22/2021)   Social Connection and Isolation Panel [NHANES]    Frequency of Communication with Friends and Family: Three times a week    Frequency of Social Gatherings with Friends and Family: Three times a week    Attends Religious Services: More than 4 times per year    Active Member of Clubs or Organizations: Yes    Attends Archivist Meetings: More than 4 times per year    Marital Status: Widowed  Intimate Partner Violence: Not At Risk (05/22/2021)   Humiliation, Afraid, Rape, and Kick questionnaire    Fear of Current or Ex-Partner: No    Emotionally Abused: No    Physically Abused: No    Sexually Abused: No   Family History  Problem Relation Age of Onset   Pneumonia Father    Heart attack Father    Colon polyps Father    Parkinsonism Mother    Diabetes Brother    Sarcoidosis Brother    Hypertension Sister    Diabetes Sister    Breast cancer Daughter    Colon cancer Neg Hx    Esophageal cancer Neg Hx    Rectal cancer Neg Hx    Stomach cancer Neg Hx    BP 128/80   Pulse 61   Wt 93.9 kg (207 lb)   LMP  (LMP Unknown)   SpO2 92%   BMI 39.11 kg/m   Wt Readings from Last 3 Encounters:  11/22/21 93.9 kg (207 lb)  09/27/21 92.6 kg (204 lb 3.2 oz)  09/19/21 91.6 kg (202 lb)   PHYSICAL EXAM: General:  Well appearing elderly female.  Ambulated into clinic with rolling walker. HEENT: normal Neck: supple. no JVD. Carotids 2+ bilat; no bruits.  Cor: PMI nondisplaced. Regular rate & rhythm. No rubs, gallops or murmurs. Lungs: clear Abdomen: obese, soft, nontender, nondistended.  Extremities: no cyanosis, clubbing, rash, edema Neuro: alert & orientedx3, cranial nerves grossly intact. moves all 4 extremities w/o difficulty. Affect flat.   ASSESSMENT & PLAN:  1. Pulmonary HTN -> cor pulmonale - Marked RV strain on echo.  - Echo (08/03/21): EF 45-50% RV dilated and hypokinetic, septal flattening.  - RHC (4/23) c/w moderate PAH, Likely Group 3 PAH. R/L filling pressures not markedly high. - PFTs (04/23): FEV1 0.87, 65% predicted - Now using Triology at  home for OSA - Discussed weight loss - Repeat echo in 2-3 months to reassess RV function and PA pressures - Improved NYHA II/early III, difficult to assess d/t baseline functional impairment. Volume looks good. Continue Torsemide 40 mg daily - Declined starting SGLT2i today. Does not want to make any medication changes. - Continue losartan 25 mg daily.  - Continue Toprol XL 100 mg daily (previously added for PACs/atrial runs) - Labs today.  2. CAD - BMS prox LAD with 3 subsequent caths showing patent stent - LHC (4/23) w/ mild to moderate non-obstructive CAD with 90% lesion in apical LAD - No s/s angina. - Continue plavix and atovastatin - Continue medical management.  3. Chronic Hypoxic Respiratory Failure OSA - Continue home O2 PRN for O2 for sats < 90%. - Using Trilogy BiPAP. Will refer to Dr. Elsworth Soho for management.   4. DM2 - On sitagliptin and glimepiride. - A1c 7.8 (2/23). - Discussed benefit of SGLT2i in CHF population. Does not want to start any new medications as above.   5. HTN - BP at goal - Continue current regiment  Follow-up: 2-3 months with echo  [name] Huge, PA-C 11/22/21  Patient seen and examined with the above-signed Advanced Practice  Provider and/or Housestaff. I personally reviewed laboratory data, imaging studies and relevant notes. I independently examined the patient and formulated the important aspects of the plan. I have edited the note to reflect any of my changes or salient points. I have personally discussed the plan with the patient and/or family.  81 y/o with CAD, moderate pulmonary HTN, DM2, obesity, severe obstructive/restrictive lung disease and untreated OSA/OHS  Here with her son. Feeling better. No edema, SOB, orthopnea or PND. Wearing Triology at night (started in hospital)  Son tracks her sats during the day an puts on O2 when they fall under 90  General:  Elderly obese female No resp difficulty HEENT: normal Neck: supple. no JVD. Carotids 2+ bilat; no bruits. No lymphadenopathy or thryomegaly appreciated. Cor: PMI nondisplaced. Regular rate & rhythm. 2/6 TR Lungs: clear Abdomen: obese soft, nontender, nondistended. No hepatosplenomegaly. No bruits or masses. Good bowel sounds. Extremities: no cyanosis, clubbing, rash, edema Neuro: alert & orientedx3, cranial nerves grossly intact. moves all 4 extremities w/o difficulty. Affect pleasant  Suspect she has WHO Group III pulmonary HTN. Not candidate for selective pulmonary artery vasodilators. Volume status looks good. Will refer to Dr. Elsworth Soho for help with management of her Triology device.   Glori Bickers, MD  6:43 PM

## 2021-11-22 NOTE — Patient Instructions (Signed)
Medication Changes:  None, continue current medications  Lab Work:  Labs done today, your results will be available in MyChart, we will contact you for abnormal readings.  Testing/Procedures:  Your physician has requested that you have an echocardiogram. Echocardiography is a painless test that uses sound waves to create images of your heart. It provides your doctor with information about the size and shape of your heart and how well your heart's chambers and valves are working. This procedure takes approximately one hour. There are no restrictions for this procedure. IN 3 MONTHS WITH FOLLOW UP  Referrals:  You have been referred to Dr Elsworth Soho at [facility]  Special Instructions // Education:  Do the following things EVERYDAY: Weigh yourself in the morning before breakfast. Write it down and keep it in a log. Take your medicines as prescribed Eat low salt foods--Limit salt (sodium) to 2000 mg per day.  Stay as active as you can everyday Limit all fluids for the day to less than 2 liters   Follow-Up in: 3 months with an echocardiogram  At the [facility], you and your health needs are our priority. We have a designated team specialized in the treatment of Heart Failure. This Care Team includes your primary Heart Failure Specialized Cardiologist (physician), Advanced Practice Providers (APPs- Physician Assistants and Nurse Practitioners), and Pharmacist who all work together to provide you with the care you need, when you need it.   You may see any of the following providers on your designated Care Team at your next follow up:  Dr Glori Bickers Dr Haynes Kerns, NP Lyda Jester, [place] [facility] [place], [place] Audry Riles, PharmD   Please be sure to bring in all your medications bottles to every appointment.   Need to Contact us:  If you have any questions or concerns before your next appointment please send [place] a message  through [place] or call our office at [phone].    TO LEAVE A MESSAGE FOR THE NURSE SELECT OPTION 2, PLEASE LEAVE A MESSAGE INCLUDING: YOUR NAME DATE OF BIRTH CALL BACK NUMBER REASON FOR CALL**this is important as we prioritize the call backs  YOU WILL RECEIVE A CALL BACK THE SAME DAY AS LONG AS YOU CALL BEFORE 4:00 PM

## 2021-11-24 ENCOUNTER — Other Ambulatory Visit: Payer: Self-pay

## 2021-11-24 ENCOUNTER — Ambulatory Visit
Admission: EM | Admit: 2021-11-24 | Discharge: 2021-11-24 | Disposition: A | Discharge status: Home / Self Care | Payer: PPO | Attending: Physician Assistant | Admitting: Physician Assistant

## 2021-11-24 ENCOUNTER — Ambulatory Visit (INDEPENDENT_AMBULATORY_CARE_PROVIDER_SITE_OTHER): Payer: PPO

## 2021-11-24 DIAGNOSIS — S6992XA Unspecified injury of left wrist, hand and finger(s), initial encounter: Secondary | ICD-10-CM | POA: Diagnosis not present

## 2021-11-24 DIAGNOSIS — M79645 Pain in left finger(s): Secondary | ICD-10-CM

## 2021-11-24 DIAGNOSIS — M7989 Other specified soft tissue disorders: Secondary | ICD-10-CM | POA: Diagnosis not present

## 2021-11-24 MED ORDER — DICLOFENAC SODIUM 1.6 % EX GEL
1.0000 | Freq: Three times a day (TID) | CUTANEOUS | 0 refills | Status: AC | PRN
Start: 2021-11-24 — End: 2021-12-04

## 2021-11-24 NOTE — ED Provider Notes (Addendum)
[facility]    CSN: [id number] Arrival date & time: 11/24/21  1020      History   Chief Complaint Chief Complaint  Patient presents with   left ring finger swollen and painful    HPI [name] is a 81 y.o. female.   Patient presents today with a 2-day history of left ring finger pain and swelling.  She reports that pain is rated 10 on a 0-10 pain scale, described as throbbing, worse with palpation or movement, no alleviating factors identified.  She denies any known injury or increase in activity prior to symptom onset.  She has not been taking any over-the-counter medication but has been using prescribed pain medicine without improvement of symptoms.  She is prescribed hydrocodone which has been ineffective in managing pain.  She denies history of gout or rheumatoid arthritis.  She does have a history of osteoarthritis.  She denies any fever, nausea, vomiting, chest pain, shortness of breath.    Past Medical History:  Diagnosis Date   Adenomatous colon polyp 02/2006   Allergy    Allergy, unspecified not elsewhere classified    Anemia    Anxiety    Arthritis    Asthma    Blood transfusion without reported diagnosis    Bronchiectasis    CAD (coronary artery disease)    BMS to the LAD, 2002; cath 12/07/11 patent LAD stent and mild nonobstructive disease, EF 65%; Medical management   Cataract    removed both eyes   CHF (congestive heart failure) (HCC)    Diastolic   Chronic diastolic CHF (congestive heart failure) ([place]) 11/25/2011   Clotting disorder ([place])    in legs per pt    Diabetes mellitus    Diverticulosis of colon    DJD (degenerative joint disease)    Fibromyalgia    GERD (gastroesophageal reflux disease)    H. pylori infection 05/12/2019   Hypercholesterolemia    Hypertension    Microscopic hematuria    Neoplasm of kidney    Obesity    Other diseases of lung, not elsewhere classified    Pinched vertebral nerve    Pulmonary nodule    Negative  PET in 2010   Stress incontinence, female    Syncope    Ulcerative colitis, left sided ([place]) 2008   Hx of   UTI (lower urinary tract infection)    Vitamin D deficiency disease     Patient Active Problem List   Diagnosis Date Noted   Hypomagnesemia 08/21/2021   Acute respiratory failure with hypoxia ([place]) 08/06/2021   Community acquired pneumonia of right lung 08/06/2021   Cor pulmonale, acute ([place]) 08/06/2021   Acute HFrEF (heart failure with reduced ejection fraction) ([place]) 08/06/2021   OSA (obstructive sleep apnea) 08/06/2021   Cervical radicular pain 08/06/2021   Cardiogenic shock ([place]) 08/03/2021   Leg cramps 07/18/2020   Low back pain 10/21/2018   Fibromyalgia 01/03/2017   Ulcerative colitis ([place]) 11/25/2011   Coronary artery disease involving native coronary artery of native heart with angina pectoris ([place])    Bronchiectasis ([place])    Essential hypertension    Vitamin D deficiency 12/03/2009   Depression 08/10/2008   PULMONARY NODULE 07/15/2008   NEOPLASM, KIDNEY 03/16/2008   STRESS INCONTINENCE 03/16/2008   COLONIC POLYPS 07/19/2007   Diabetes mellitus with coincident hypertension ([place]) 07/19/2007   Morbid obesity ([place]) 07/19/2007   Asthma 07/19/2007   DIVERTICULOSIS OF COLON 07/19/2007   ANXIETY 03/19/2007   GERD 03/19/2007   Osteoarthritis  03/19/2007   FIBROMYALGIA 03/19/2007    Past Surgical History:  Procedure Laterality Date   ABDOMINAL HYSTERECTOMY     CARDIAC CATHETERIZATION  11/2011   CATARACT EXTRACTION, BILATERAL  2020   CHOLECYSTECTOMY     COLONOSCOPY  01/09/2018   per Dr. Fuller Plan, adenomatous polyps, repeat in 3 yrs   CORONARY STENT PLACEMENT  2002   BMS to the LAD   ESOPHAGOGASTRODUODENOSCOPY  12/2008   HAND SURGERY  01/2006   Right Hand Surgery by Dr. Amedeo Plenty   LEFT HEART CATHETERIZATION WITH CORONARY ANGIOGRAM N/A 12/07/2011   Procedure: LEFT HEART CATHETERIZATION WITH CORONARY ANGIOGRAM;  Surgeon: Peter M Martinique, MD;  Location: [facility] C[facility] LAB;   Service: Cardiovascular;  Laterality: N/A;   RIGHT/LEFT HEART CATH AND CORONARY ANGIOGRAPHY N/A 08/07/2021   Procedure: RIGHT/LEFT HEART CATH AND CORONARY ANGIOGRAPHY;  Surgeon: Jolaine Artist, MD;  Location: [place] [facility] LAB;  Service: Cardiovascular;  Laterality: N/A;   UPPER GASTROINTESTINAL ENDOSCOPY      OB History   No obstetric history on file.      Home Medications    Prior to Admission medications   Medication Sig Start Date End Date Taking? Authorizing Provider  Diclofenac Sodium 1.6 % GEL Apply 1 Application topically 3 (three) times daily as needed for up to 10 days. 11/24/21 12/04/21 Yes , Derry Skill, PA-C  acetaminophen (TYLENOL) 500 MG tablet Take 500 mg by mouth every 6 (six) hours as needed for headache (pain).     [provider]  albuterol (PROVENTIL) (2.5 MG/3ML) 0.083% nebulizer solution Take 3 mLs (2.5 mg total) by nebulization every 4 (four) hours as needed for wheezing or shortness of breath. 02/12/17   [name], MD  albuterol (VENTOLIN HFA) 108 (90 BASE) MCG/ACT inhaler Inhale 2 puffs into the lungs every 4 (four) hours as needed for wheezing or shortness of breath. 05/25/14   [name], MD  ALPRAZolam Duanne Moron) 0.5 MG tablet TAKE 1 TABLET BY MOUTH 3 TIMES DAILY AS NEEDED. 11/06/21   [name], MD  amLODipine (NORVASC) 10 MG tablet TAKE 1 TABLET BY MOUTH EVERY DAY 10/30/21   Jerline Pain, MD  atorvastatin (LIPITOR) 20 MG tablet TAKE 1 TABLET (20 MG TOTAL) BY MOUTH DAILY. PLEASE KEEP UPCOMING APPT FOR FUTURE REFILLS. 05/16/21   Jerline Pain, MD  azelastine (ASTELIN) 0.1 % nasal spray Place 1 spray into both nostrils at bedtime. 12/08/16   [provider]  Blood Glucose Monitoring Suppl ([place]) w/Device KIT 1 each by Does not apply route daily at 12 noon. 07/14/20   [name], MD  Cholecalciferol (VITAMIN D-3) 5000 UNITS TABS Patient takes 1 tablet by mouth once a week on Mondays.    [provider]   clopidogrel (PLAVIX) 75 MG tablet Take 1 tablet (75 mg total) by mouth daily. 05/04/21   Jerline Pain, MD  diphenoxylate-atropine (LOMOTIL) 2.5-0.025 MG tablet TAKE 1 TABLET BY MOUTH TWICE A DAY AS NEEDED DIARRHEA OR LOOSE STOOL 11/13/21   Ladene Artist, MD  fluticasone [facility]) 50 MCG/ACT nasal spray PLACE 2 SPRAYS IN EACH NOSTRIL AT BEDTIME 07/26/15   [name], MD  gabapentin (NEURONTIN) 300 MG capsule Take 1 capsule (300 mg total) by mouth 2 (two) times daily. 09/05/21   [name], MD  glimepiride (AMARYL) 1 MG tablet Take 2 tablets (2 mg total) by mouth daily with breakfast. 06/16/21   [name], MD  glycopyrrolate (ROBINUL) 1  MG tablet Take 1 tablet (1 mg total) by mouth 3 (three) times daily. 08/21/21   [name], MD  HYDROcodone-acetaminophen (NORCO) 10-325 MG tablet Take 1 tablet by mouth every 6 (six) hours as needed for moderate pain. 12/03/21 01/02/22  [name], MD  hydrOXYzine (VISTARIL) 25 MG capsule TAKE 1 CAPSULE (25 MG TOTAL) BY MOUTH EVERY 6 (SIX) HOURS AS NEEDED. 10/16/21   [name], MD  levocetirizine (XYZAL) 5 MG tablet Take 5 mg by mouth every evening.    [provider]  LIDODERM 5 % APPLY 1 PATCH TO SKIN FOR 12 HOURS THEN REMOVE AND DISCARD PATCH WITHIN 72 HOURS OR AS DIRECTED 03/10/13   Noralee Space, MD  loperamide (IMODIUM) 2 MG capsule Take 4-6 mg by mouth daily.    [provider]  losartan (COZAAR) 25 MG tablet Take 1 tablet (25 mg total) by mouth daily. 08/11/21   Nita Sells, MD  magnesium oxide (MAG-OX) 400 MG tablet Take 400 mg by mouth 3 (three) times daily.    [provider]  mesalamine (LIALDA) 1.2 g EC tablet TAKE 2 TABLETS (2.4 G TOTAL) BY MOUTH 2 (TWO) TIMES DAILY. 07/24/21   Ladene Artist, MD  metFORMIN (GLUCOPHAGE-XR) 750 MG 24 hr tablet TAKE 1 TABLET BY MOUTH EVERY DAY WITH BREAKFAST 10/30/21   [name], MD  metoprolol succinate (TOPROL XL) 100 MG 24 hr tablet Take 1 tablet (100 mg total)  by mouth daily. Take with or immediately following a meal. 08/11/21 08/11/22  Nita Sells, MD  montelukast (SINGULAIR) 10 MG tablet Take one every morning 10/28/18   [name], MD  neomycin-polymyxin-hydrocortisone (CORTISPORIN) OTIC solution Place 4 drops into both ears 4 (four) times daily. 05/25/20   [name], MD  nitroGLYCERIN (NITROSTAT) 0.4 MG SL tablet PLACE 1 TABLET (0.4 MG TOTAL) UNDER THE TONGUE EVERY 5 (FIVE) MINUTES AS NEEDED FOR CHEST PAIN. 11/11/19   Jerline Pain, MD  Olopatadine HCl 0.7 % SOLN Place 1 drop into both eyes daily as needed (itching/allergies).    [provider]  ondansetron (ZOFRAN-ODT) 4 MG disintegrating tablet TAKE 1 TABLET BY MOUTH EVERY 8 HOURS AS NEEDED FOR NAUSEA AND VOMITING 01/11/21   Ladene Artist, MD  OneTouch Delica Lancets 94W MISC 1 each by Does not apply route daily at 12 noon. 07/14/20   [name], MD  [facility] VERIO test strip USE TO CHECK BLOOD GLUCOSE ONCE DAILY 09/19/21   [name], MD  Pancrelipase, Lip-Prot-Amyl, (ZENPEP) 40000-126000 units CPEP Take by mouth. Take 2 tablets by mouth before meals and 1 tablet before snacks    [provider]  pantoprazole (PROTONIX) 40 MG tablet TAKE 1 TABLET BY MOUTH TWICE A DAY 11/06/21   Ladene Artist, MD  Polyethyl Glycol-Propyl Glycol (SYSTANE OP) Place 1 drop into both eyes daily. For dry eyes    [provider]  potassium chloride SA (KLOR-CON M) 20 MEQ tablet Take 1 tablet (20 mEq total) by mouth 2 (two) times daily. 08/21/21   [name], MD  sitaGLIPtin (JANUVIA) 100 MG tablet Take 1 tablet (100 mg total) by mouth daily. 06/19/21   [name], MD  tiZANidine (ZANAFLEX) 4 MG tablet TAKE 1 TABLET BY MOUTH TWICE A DAY 06/19/21   [name], MD  TOBRADEX ophthalmic solution Place 1 drop into both eyes 4 (four) times daily. As needed 07/11/21   [provider]  torsemide (DEMADEX) 20  MG tablet TAKE 2 TABLETS DAILY 11/06/21   [name],  MD    Family History Family History  Problem Relation Age of Onset   Pneumonia Father    Heart attack Father    Colon polyps Father    Parkinsonism Mother    Diabetes Brother    Sarcoidosis Brother    Hypertension Sister    Diabetes Sister    Breast cancer Daughter    Colon cancer Neg Hx    Esophageal cancer Neg Hx    Rectal cancer Neg Hx    Stomach cancer Neg Hx     Social History Social History   Tobacco Use   Smoking status: Former    Packs/day: 2.00    Years: 15.00    Total pack years: 30.00    Types: Cigarettes    Quit date: 04/30/1973    Years since quitting: 48.6   Smokeless tobacco: Never   Tobacco comments:    ex-smoker: smoked for 10-15 years up to 2ppd, quit 1975.  Vaping Use   Vaping Use: Never used  Substance Use Topics   Alcohol use: Not Currently    Alcohol/week: 0.0 standard drinks of alcohol    Comment: none now    Drug use: No     Allergies   Doxycycline, Aspirin, Azithromycin, Caffeine, Ciprofloxacin, Codeine, Oxycodone, Sulfamethizole, and Tramadol hcl   Review of Systems Review of Systems  Constitutional:  Positive for activity change. Negative for appetite change, fatigue and fever.  Respiratory:  Negative for cough and shortness of breath.   Cardiovascular:  Negative for chest pain, palpitations and leg swelling.  Gastrointestinal:  Negative for abdominal pain, diarrhea, nausea and vomiting.  Musculoskeletal:  Positive for arthralgias and joint swelling. Negative for myalgias.  Skin:  Negative for color change and wound.  Neurological:  Negative for weakness and numbness.     Physical Exam Triage Vital Signs ED Triage Vitals  Enc Vitals Group     BP 11/24/21 1035 130/80     Pulse Rate 11/24/21 1035 80     Resp 11/24/21 1035 18     Temp 11/24/21 1035 98 F (36.7 C)     Temp Source 11/24/21 1035 Oral     SpO2 11/24/21 1035 96 %     Weight --      Height --      Head Circumference --      Peak Flow --      Pain Score  11/24/21 1034 0     Pain Loc --      Pain Edu? --      Excl. in [place]? --    No data found.  Updated Vital Signs BP 130/80 (BP Location: Left Arm)   Pulse 80   Temp 98 F (36.7 C) (Oral)   Resp 18   LMP  (LMP Unknown)   SpO2 96%   Visual Acuity Right Eye Distance:   Left Eye Distance:   Bilateral Distance:    Right Eye Near:   Left Eye Near:    Bilateral Near:     Physical Exam Vitals reviewed.  Constitutional:      General: She is awake. She is not in acute distress.    Appearance: Normal appearance. She is well-developed. She is not ill-appearing.     Interventions: Nasal cannula in place.     Comments: Very pleasant female appears stated age in no acute distress sitting comfortably in exam room  HENT:     Head:  Normocephalic and atraumatic.     Mouth/Throat:     Pharynx: Uvula midline. No oropharyngeal exudate or posterior oropharyngeal erythema.  Cardiovascular:     Rate and Rhythm: Normal rate. Rhythm regularly irregular.     Heart sounds: Normal heart sounds, S1 normal and S2 normal. No murmur heard. Pulmonary:     Effort: Pulmonary effort is normal.     Breath sounds: Normal breath sounds. No wheezing, rhonchi or rales.  Abdominal:     Palpations: Abdomen is soft.     Tenderness: There is no abdominal tenderness.  Musculoskeletal:     Left hand: Swelling, tenderness and bony tenderness present. Decreased range of motion. Normal strength. Normal sensation. There is no disruption of two-point discrimination. Normal capillary refill.     Comments: Swelling of left ring finger worse over PIP joint.  Tenderness palpation over left ring finger PIP.  Decreased range of motion with flexion.  Finger neurovascularly intact.  Psychiatric:        Behavior: Behavior is cooperative.      UC Treatments / Results  Labs (all labs ordered are listed, but only abnormal results are displayed) Labs Reviewed  CBC WITH DIFFERENTIAL/PLATELET  URIC ACID  SEDIMENTATION RATE     EKG   Radiology DG Finger Ring Left  Result Date: 11/24/2021 CLINICAL DATA:  Left ring finger pain and injury beginning yesterday. EXAM: LEFT RING FINGER 2+V COMPARISON:  None Available. FINDINGS: There is no evidence of fracture or dislocation. There is no evidence of arthropathy or other focal bone abnormality. Ligamentous calcification is seen along the radial aspect of the PIP joint, consistent with chronic ligamentous injury. IMPRESSION: No acute findings. Electronically Signed   By: Marlaine Hind [name]   On: 11/24/2021 11:06    Procedures Procedures (including critical care time)  Medications Ordered in UC Medications - No data to display  Initial Impression / Assessment and Plan / UC Course  I have reviewed the triage vital signs and the nursing notes.  Pertinent labs & imaging results that were available during my care of the patient were reviewed by me and considered in my medical decision making (see chart for details).     Patient is afebrile, nontoxic, nontachycardic, well-appearing.  X-ray was obtained which showed no acute abnormalities but did show evidence of remote ligamentous injury which could be contributing to symptoms.  Patient denies history of gout or rheumatoid arthritis.  Will obtain basic blood work including CBC, uric acid, sed rate.  Patient had BMP that was normal several days ago.  If she has significantly elevated uric acid we will consider colchicine for treatment.  She is prescribed chronic opioids was encouraged to continue taking hydrocodone for pain.  Will use topical diclofenac 1.6% for additional pain relief that she is unable to take NSAIDs due to cardiovascular history.  Patient has a history of diabetes and so is not candidate for steroids.  Low suspicion for septic joint given patient is well-appearing.  Will consider addition of antibiotics if she has significant leukocytosis and abnormal sed rate.  Discussed that she should follow-up closely with  her primary care.  Also recommend follow-up with orthopedics.  She was given contact information for local provider with instruction to call to schedule an appointment.  Discussed that if she has any worsening symptoms she needs to be seen immediately.  Strict return precautions given.  On exam patient was noted to have irregularly irregular heart rate.  EKG was obtained that showed sinus rhythm with  ventricular rate of 80 bpm with premature atrial complexes similar to 09/27/2021 tracing without ischemic changes.  She was encouraged to follow-up with her cardiologist as scheduled.  Addendum: Unable to obtain blood specimen from patient.  We will treat symptomatically with diclofenac and recommended that she follow-up closely with orthopedics.  She is to call and schedule appoint with them soon as possible.  Discussed that if she has any worsening symptoms she must go to the emergency room immediately to which she expressed understanding.  Final Clinical Impressions(s) / UC Diagnoses   Final diagnoses:  Finger pain, left  Swelling of left ring finger     Discharge Instructions      Your x-ray was normal.  Please use diclofenac gel.  Continue your prescription pain medication as prescribed.  Keep the finger elevated.  We will contact you with your lab work if anything is abnormal we need to change our treatment plan.  Follow-up with orthopedics; call to schedule an appointment.  If anything worsens and you have additional pain or swelling, fever, nausea, vomiting you need to be seen immediately.  Your EKG was stable compared to 09/27/2021 tracing with premature atrial complexes.  Follow-up with your cardiologist as scheduled.     ED Prescriptions     Medication Sig Dispense Auth. Provider   Diclofenac Sodium 1.6 % GEL Apply 1 Application topically 3 (three) times daily as needed for up to 10 days. 75 g Renald Haithcock, Derry Skill, PA-C      PDMP not reviewed this encounter.   Terrilee Croak,  PA-C 11/24/21 1149    Dammon Makarewicz, Derry Skill, PA-C 11/24/21 1155

## 2021-11-24 NOTE — Discharge Instructions (Addendum)
Your x-ray was normal.  Please use diclofenac gel.  Continue your prescription pain medication as prescribed.  Keep the finger elevated.  We will contact you with your lab work if anything is abnormal we need to change our treatment plan.  Follow-up with orthopedics; call to schedule an appointment.  If anything worsens and you have additional pain or swelling, fever, nausea, vomiting you need to be seen immediately.  Your EKG was stable compared to 09/27/2021 tracing with premature atrial complexes.  Follow-up with your cardiologist as scheduled.

## 2021-11-24 NOTE — ED Triage Notes (Signed)
Pt c/o left ring finger being painful and swollen since yesterday

## 2021-11-26 ENCOUNTER — Other Ambulatory Visit: Payer: Self-pay | Admitting: Gastroenterology

## 2021-11-28 DIAGNOSIS — M25522 Pain in left elbow: Secondary | ICD-10-CM | POA: Diagnosis not present

## 2021-11-28 DIAGNOSIS — R5383 Other fatigue: Secondary | ICD-10-CM | POA: Diagnosis not present

## 2021-11-28 DIAGNOSIS — M109 Gout, unspecified: Secondary | ICD-10-CM | POA: Diagnosis not present

## 2021-11-28 DIAGNOSIS — M79641 Pain in right hand: Secondary | ICD-10-CM | POA: Diagnosis not present

## 2021-11-28 DIAGNOSIS — M25521 Pain in right elbow: Secondary | ICD-10-CM | POA: Diagnosis not present

## 2021-11-28 DIAGNOSIS — M2550 Pain in unspecified joint: Secondary | ICD-10-CM | POA: Diagnosis not present

## 2021-11-28 DIAGNOSIS — E559 Vitamin D deficiency, unspecified: Secondary | ICD-10-CM | POA: Diagnosis not present

## 2021-12-01 ENCOUNTER — Telehealth: Payer: Self-pay | Admitting: Gastroenterology

## 2021-12-01 DIAGNOSIS — J984 Other disorders of lung: Secondary | ICD-10-CM | POA: Diagnosis not present

## 2021-12-01 NOTE — Telephone Encounter (Signed)
Inbound call from patient stating she needs refill of LOMOTIL. Patient is requesting it be sent today due to needing the medication over the weekend. Please advise.

## 2021-12-03 ENCOUNTER — Other Ambulatory Visit: Payer: Self-pay | Admitting: Gastroenterology

## 2021-12-04 MED ORDER — DIPHENOXYLATE-ATROPINE 2.5-0.025 MG PO TABS: ORAL_TABLET | ORAL | 0 refills | Status: DC

## 2021-12-04 NOTE — Telephone Encounter (Signed)
Prescription faxed to patient's pharmacy. 

## 2021-12-05 DIAGNOSIS — M79641 Pain in right hand: Secondary | ICD-10-CM | POA: Diagnosis not present

## 2021-12-05 DIAGNOSIS — M25521 Pain in right elbow: Secondary | ICD-10-CM | POA: Diagnosis not present

## 2021-12-11 DIAGNOSIS — N281 Cyst of kidney, acquired: Secondary | ICD-10-CM | POA: Diagnosis not present

## 2021-12-13 DIAGNOSIS — N281 Cyst of kidney, acquired: Secondary | ICD-10-CM | POA: Diagnosis not present

## 2021-12-13 DIAGNOSIS — N2889 Other specified disorders of kidney and ureter: Secondary | ICD-10-CM | POA: Diagnosis not present

## 2021-12-13 DIAGNOSIS — I700 Atherosclerosis of aorta: Secondary | ICD-10-CM | POA: Diagnosis not present

## 2021-12-13 DIAGNOSIS — Z9049 Acquired absence of other specified parts of digestive tract: Secondary | ICD-10-CM | POA: Diagnosis not present

## 2021-12-13 DIAGNOSIS — K5730 Diverticulosis of large intestine without perforation or abscess without bleeding: Secondary | ICD-10-CM | POA: Diagnosis not present

## 2021-12-15 ENCOUNTER — Other Ambulatory Visit: Payer: Self-pay | Admitting: Gastroenterology

## 2021-12-15 DIAGNOSIS — I2609 Other pulmonary embolism with acute cor pulmonale: Secondary | ICD-10-CM | POA: Diagnosis not present

## 2021-12-15 DIAGNOSIS — J9601 Acute respiratory failure with hypoxia: Secondary | ICD-10-CM | POA: Diagnosis not present

## 2021-12-15 DIAGNOSIS — J189 Pneumonia, unspecified organism: Secondary | ICD-10-CM | POA: Diagnosis not present

## 2021-12-18 ENCOUNTER — Other Ambulatory Visit: Payer: Self-pay

## 2021-12-18 ENCOUNTER — Institutional Professional Consult (permissible substitution): Payer: PPO | Admitting: Adult Health

## 2021-12-18 DIAGNOSIS — N281 Cyst of kidney, acquired: Secondary | ICD-10-CM | POA: Diagnosis not present

## 2021-12-18 MED ORDER — DIPHENOXYLATE-ATROPINE 2.5-0.025 MG PO TABS: 1.0000 | ORAL_TABLET | Freq: Two times a day (BID) | ORAL | 0 refills | Status: DC | PRN

## 2021-12-18 NOTE — Telephone Encounter (Signed)
Inbound call from patient requesting refill for Lomotil .Please give a call back if needing to further advise.  Thank you

## 2021-12-18 NOTE — Telephone Encounter (Signed)
Received automated refill request this morning. Forwarded request to Dr. Fuller Plan to get his approval. Dr. Fuller Plan approved but pharmacy never received the refill. Re-faxed prescription to [facility]. Informed patient that the prescription was faxed. Patient verbalized understanding.

## 2021-12-18 NOTE — Telephone Encounter (Signed)
Received automated refill request this morning. Forwarded request to Dr. Fuller Plan to get his approval. Dr. Fuller Plan approved but pharmacy never received the refill. Re-faxed prescription to [facility].

## 2021-12-18 NOTE — Addendum Note (Signed)
Addended by: Dorisann Frames L on: 12/18/2021 02:06 PM   Modules accepted: Orders

## 2021-12-19 DIAGNOSIS — T560X1A Toxic effect of lead and its compounds, accidental (unintentional), initial encounter: Secondary | ICD-10-CM | POA: Diagnosis not present

## 2021-12-26 ENCOUNTER — Ambulatory Visit (INDEPENDENT_AMBULATORY_CARE_PROVIDER_SITE_OTHER): Payer: PPO | Admitting: Adult Health

## 2021-12-26 ENCOUNTER — Encounter: Payer: Self-pay | Admitting: Adult Health

## 2021-12-26 ENCOUNTER — Telehealth: Payer: Self-pay

## 2021-12-26 DIAGNOSIS — J9611 Chronic respiratory failure with hypoxia: Secondary | ICD-10-CM

## 2021-12-26 DIAGNOSIS — G4733 Obstructive sleep apnea (adult) (pediatric): Secondary | ICD-10-CM

## 2021-12-26 NOTE — Progress Notes (Signed)
_0  ID: [name], female    DOB: [birth year], 81 y.o.   MRN: [id number]  Chief Complaint  Patient presents with   Consult    Referring provider: Jolaine Artist, MD  HPI: Patient presents for a sleep consult December 26, 2021 for suspected sleep apnea and OHS. Hospitalized April 2023 with altered mental status and acute respiratory failure requiring intubation.  Found to have significant pulmonary hypertension she was discharged on a trilogy noninvasive vent and home oxygen  Medical history significant for congestive heart failure, coronary artery disease, diabetes  TEST/EVENTS :  Hospital 07/2021  Found unresponsive by family in April 2023, EMS called and intubated in ED. Hypotensive and bradycardiac. Started on NE, IVF and atropine.  She was treated for RML PNA. Admitted to ICU.  Concern for sepsis +/- CGS, PNA in setting of underlying PAH/cor pulmonale and untreated OSA. POCUS -> Echo LVEF 55%, RV moderately reduced. R/LHC mild to moderate non-obstructive CAD with 90% lesion in apical LAD, normal LV function and moderate PAH with preserved output.  Pressors weaned off and patient extubated. Discharged home on 3L oxygen, weight 209 lbs. Discharged home with Trilogy  machine.  12/26/2021 Sleep consult  Patient presents for a sleep consult.  Patient was admitted in April 2023 for altered mental status.  Patient was found unresponsive at home.  She was transported to the emergency room via EMS.  Found to be hypotensive, bradycardic.  She required pressor support.  Work-up revealed a probable right middle lobe pneumonia.  Concerning for septic shock.  Cardiac work-up revealed nonobstructive coronary disease.  Moderate pulmonary hypertension.  She did require intubation and vent support.  She was extubated.  She did require oxygen at discharge at 3 L.  She was also started on home trilogy device at bedtime.  Patient is accompanied by family member.  Patient says since discharge she is  doing better.  She is wearing her trilogy noninvasive vent at bedtime.  Says it is made a huge difference.  She has decreased confusion and daytime sleepiness.  Family member says it is made a huge difference in her quality of life. Patient was suspected to have underlying obstructive sleep apnea and OHS.  She has had no previous sleep study. Says that she typically gets in about 6 hours of sleep each night with trilogy.  She says occasionally she does fall asleep during the daytime and does not wear it but for the most part wears it every single night and feels so much better since she started wearing it.  Patient is on multiple sedating medications including hydrocodone, Xanax, gabapentin, Robinul, hydroxyzine, Zanaflex. She does have a history of asthma is managed at asthma and allergy with Dr. Fredderick Phenix.  She is maintained on Symbicort and Singulair. Patient was discharged on oxygen currently on 2 L of oxygen with activity and at bedtime with her trilogy device.  She typically goes to bed about 10 PM.  Takes about 30 minutes to go to sleep.  Is up 2 times each night.  Is up about 8 AM.  She does live with her family.  Weight is down about 10 pounds.  Current weight is at 205 pounds with a BMI of 40.  Epworth score is 8 out of 24.  Typically gets sleepy if she sits down to watch TV or rest.       12/26/2021    9:00 AM  Results of the Epworth flowsheet  Sitting and reading 2  Watching TV 2  Sitting, inactive in a public place (e.g. a theatre or a meeting) 3  As a passenger in a car for an hour without a break 0  Lying down to rest in the afternoon when circumstances permit 0  Sitting and talking to someone 1  Sitting quietly after a lunch without alcohol 0  In a car, while stopped for a few minutes in traffic 0  Total score 8       Allergies  Allergen Reactions   Doxycycline Other (See Comments)    Unsteady gait   Aspirin Nausea And Vomiting   Azithromycin Other (See Comments)     REACTION: pt states "ZPak doesn't work"   Caffeine Nausea And Vomiting   Ciprofloxacin Nausea And Vomiting    Tolerates levaquin   Codeine Nausea And Vomiting   Oxycodone Other (See Comments)    Pt stated, "upsets my stomach" (11/19/17)   Sulfamethizole Other (See Comments)    Unknown reaction   Tramadol Hcl Other (See Comments)    REACTION: pt states "spaced-out"    Immunization History  Administered Date(s) Administered   Influenza, High Dose Seasonal PF 02/17/2019   PFIZER(Purple Top)SARS-COV-2 Vaccination 03/17/2020, 04/09/2020   Pneumococcal Conjugate-13 03/15/2015   Pneumococcal Polysaccharide-23 05/21/2006, 02/23/2015, 05/21/2016, 08/14/2018, 02/17/2019, 03/17/2020, 10/17/2020    Past Medical History:  Diagnosis Date   Adenomatous colon polyp 02/2006   Allergy    Allergy, unspecified not elsewhere classified    Anemia    Anxiety    Arthritis    Asthma    Blood transfusion without reported diagnosis    Bronchiectasis    CAD (coronary artery disease)    BMS to the LAD, 2002; cath 12/07/11 patent LAD stent and mild nonobstructive disease, EF 65%; Medical management   Cataract    removed both eyes   CHF (congestive heart failure) (HCC)    Diastolic   Chronic diastolic CHF (congestive heart failure) ([place]) 11/25/2011   Clotting disorder ([place])    in legs per pt    Diabetes mellitus    Diverticulosis of colon    DJD (degenerative joint disease)    Fibromyalgia    GERD (gastroesophageal reflux disease)    H. pylori infection 05/12/2019   Hypercholesterolemia    Hypertension    Microscopic hematuria    Neoplasm of kidney    Obesity    Other diseases of lung, not elsewhere classified    Pinched vertebral nerve    Pulmonary nodule    Negative PET in 2010   Stress incontinence, female    Syncope    Ulcerative colitis, left sided ([place]) 2008   Hx of   UTI (lower urinary tract infection)    Vitamin D deficiency disease     Tobacco History: Social History   Tobacco  Use  Smoking Status Former   Packs/day: 2.00   Years: 15.00   Total pack years: 30.00   Types: Cigarettes   Quit date: 04/30/1973   Years since quitting: 48.6  Smokeless Tobacco Never  Tobacco Comments   ex-smoker: smoked for 10-15 years up to 2ppd, quit 1975.   Counseling given: Not Answered Tobacco comments: ex-smoker: smoked for 10-15 years up to 2ppd, quit 1975.   Outpatient Medications Prior to Visit  Medication Sig Dispense Refill   acetaminophen (TYLENOL) 500 MG tablet Take 500 mg by mouth every 6 (six) hours as needed for headache (pain).      albuterol (PROVENTIL) (2.5 MG/3ML) 0.083% nebulizer solution Take 3 mLs (2.5 mg total) by nebulization  every 4 (four) hours as needed for wheezing or shortness of breath. 75 mL 3   albuterol (VENTOLIN HFA) 108 (90 BASE) MCG/ACT inhaler Inhale 2 puffs into the lungs every 4 (four) hours as needed for wheezing or shortness of breath. 18 each 11   ALPRAZolam (XANAX) 0.5 MG tablet TAKE 1 TABLET BY MOUTH 3 TIMES DAILY AS NEEDED. 90 tablet 5   amLODipine (NORVASC) 10 MG tablet TAKE 1 TABLET BY MOUTH EVERY DAY 90 tablet 3   atorvastatin (LIPITOR) 20 MG tablet TAKE 1 TABLET (20 MG TOTAL) BY MOUTH DAILY. PLEASE KEEP UPCOMING APPT FOR FUTURE REFILLS. 90 tablet 3   azelastine (ASTELIN) 0.1 % nasal spray Place 1 spray into both nostrils at bedtime.     Blood Glucose Monitoring Suppl (ONETOUCH VERIO FLEX SYSTEM) w/Device KIT 1 each by Does not apply route daily at 12 noon. 1 kit 0   Cholecalciferol (VITAMIN D-3) 5000 UNITS TABS Patient takes 1 tablet by mouth once a week on Mondays.     clopidogrel (PLAVIX) 75 MG tablet Take 1 tablet (75 mg total) by mouth daily. 90 tablet 3   diphenoxylate-atropine (LOMOTIL) 2.5-0.025 MG tablet Take 1 tablet by mouth 2 (two) times daily as needed. 60 tablet 0   fluticasone (FLONASE) 50 MCG/ACT nasal spray PLACE 2 SPRAYS IN EACH NOSTRIL AT BEDTIME 16 g 6   gabapentin (NEURONTIN) 300 MG capsule Take 1 capsule (300 mg  total) by mouth 2 (two) times daily. 60 capsule 5   glimepiride (AMARYL) 1 MG tablet Take 2 tablets (2 mg total) by mouth daily with breakfast. 90 tablet 3   glycopyrrolate (ROBINUL) 1 MG tablet Take 1 tablet (1 mg total) by mouth 3 (three) times daily. 3 tablet 0   HYDROcodone-acetaminophen (NORCO) 10-325 MG tablet Take 1 tablet by mouth every 6 (six) hours as needed for moderate pain. 120 tablet 0   hydrOXYzine (VISTARIL) 25 MG capsule TAKE 1 CAPSULE (25 MG TOTAL) BY MOUTH EVERY 6 (SIX) HOURS AS NEEDED. 360 capsule 0   levocetirizine (XYZAL) 5 MG tablet Take 5 mg by mouth every evening.     LIDODERM 5 % APPLY 1 PATCH TO SKIN FOR 12 HOURS THEN REMOVE AND DISCARD PATCH WITHIN 72 HOURS OR AS DIRECTED 30 patch 2   loperamide (IMODIUM) 2 MG capsule Take 4-6 mg by mouth daily.     losartan (COZAAR) 25 MG tablet Take 1 tablet (25 mg total) by mouth daily. 30 tablet 0   magnesium oxide (MAG-OX) 400 MG tablet Take 400 mg by mouth 3 (three) times daily.     mesalamine (LIALDA) 1.2 g EC tablet TAKE 2 TABLETS (2.4 G TOTAL) BY MOUTH 2 (TWO) TIMES DAILY. 360 tablet 1   metFORMIN (GLUCOPHAGE-XR) 750 MG 24 hr tablet TAKE 1 TABLET BY MOUTH EVERY DAY WITH BREAKFAST 90 tablet 0   metoprolol succinate (TOPROL XL) 100 MG 24 hr tablet Take 1 tablet (100 mg total) by mouth daily. Take with or immediately following a meal. 30 tablet 11   montelukast (SINGULAIR) 10 MG tablet Take one every morning 90 tablet 3   neomycin-polymyxin-hydrocortisone (CORTISPORIN) OTIC solution Place 4 drops into both ears 4 (four) times daily. 10 mL 2   nitroGLYCERIN (NITROSTAT) 0.4 MG SL tablet PLACE 1 TABLET (0.4 MG TOTAL) UNDER THE TONGUE EVERY 5 (FIVE) MINUTES AS NEEDED FOR CHEST PAIN. 75 tablet 1   Olopatadine HCl 0.7 % SOLN Place 1 drop into both eyes daily as needed (itching/allergies).  ondansetron (ZOFRAN-ODT) 4 MG disintegrating tablet TAKE 1 TABLET BY MOUTH EVERY 8 HOURS AS NEEDED FOR NAUSEA AND VOMITING 30 tablet 1   OneTouch  Delica Lancets 18A MISC 1 each by Does not apply route daily at 12 noon. 100 each 0   ONETOUCH VERIO test strip USE TO CHECK BLOOD GLUCOSE ONCE DAILY 100 strip 1   Pancrelipase, Lip-Prot-Amyl, (ZENPEP) 40000-126000 units CPEP Take by mouth. Take 2 tablets by mouth before meals and 1 tablet before snacks     pantoprazole (PROTONIX) 40 MG tablet TAKE 1 TABLET BY MOUTH TWICE A DAY 180 tablet 0   Polyethyl Glycol-Propyl Glycol (SYSTANE OP) Place 1 drop into both eyes daily. For dry eyes     potassium chloride SA (KLOR-CON M) 20 MEQ tablet Take 1 tablet (20 mEq total) by mouth 2 (two) times daily. 60 tablet 11   sitaGLIPtin (JANUVIA) 100 MG tablet Take 1 tablet (100 mg total) by mouth daily. 90 tablet 3   tiZANidine (ZANAFLEX) 4 MG tablet TAKE 1 TABLET BY MOUTH TWICE A DAY 180 tablet 3   TOBRADEX ophthalmic solution Place 1 drop into both eyes 4 (four) times daily. As needed     torsemide (DEMADEX) 20 MG tablet TAKE 2 TABLETS DAILY 180 tablet 3   Facility-Administered Medications Prior to Visit  Medication Dose Route Frequency Provider Last Rate Last Admin   ipratropium-albuterol (DUONEB) 0.5-2.5 (3) MG/3ML nebulizer solution 3 mL  3 mL Nebulization Once Nafziger, Cory, NP         Review of Systems:   Constitutional:   No  weight loss, night sweats,  Fevers, chills, + fatigue, or  lassitude.  HEENT:   No headaches,  Difficulty swallowing,  Tooth/dental problems, or  Sore throat,                No sneezing, itching, ear ache, nasal congestion, post nasal drip,   CV:  No chest pain,  Orthopnea, PND, swelling in lower extremities, anasarca, dizziness, palpitations, syncope.   GI  No heartburn, indigestion, abdominal pain, nausea, vomiting, diarrhea, change in bowel habits, loss of appetite, bloody stools.   Resp:   No excess mucus, no productive cough,  No non-productive cough,  No coughing up of blood.  No change in color of mucus.  No wheezing.  No chest wall deformity  Skin: no rash or  lesions.  GU: no dysuria, change in color of urine, no urgency or frequency.  No flank pain, no hematuria   MS:  No joint pain or swelling.  No decreased range of motion.  No back pain.    Physical Exam  BP 128/78 (BP Location: Left Arm, Cuff Size: Large)   Pulse 71   Ht 5' (1.524 m)   Wt 205 lb (93 kg)   LMP  (LMP Unknown)   SpO2 92%   BMI 40.04 kg/m   GEN: A/Ox3; pleasant , NAD, elderly, chronically ill-appearing.  Walks with a walker   HEENT:  [place]/AT,  EACs-clear, TMs-wnl, NOSE-clear, THROAT-clear, no lesions, no postnasal drip or exudate noted.   NECK:  Supple w/ fair ROM; no JVD; normal carotid impulses w/o bruits; no thyromegaly or nodules palpated; no lymphadenopathy.    RESP  Clear  P & A; w/o, wheezes/ rales/ or rhonchi. no accessory muscle use, no dullness to percussion  CARD:  RRR, no m/r/g, trace to 1+ peripheral edema, pulses intact, no cyanosis or clubbing.  GI:   Soft & nt; nml bowel sounds; no organomegaly or  masses detected.   Musco: Warm bil, no deformities or joint swelling noted.   Neuro: alert, no focal deficits noted.    Skin: Warm, no lesions or rashes    Lab Results:     BNP   Imaging: No results found.       Latest Ref Rng & Units 08/10/2021    2:52 PM  PFT Results  FVC-Pre L 1.14   FVC-Predicted Pre % 65   Pre FEV1/FVC % % 77   FEV1-Pre L 0.87   FEV1-Predicted Pre % 65     No results found for: "NITRICOXIDE"      Assessment & Plan:   OSA (obstructive sleep apnea) Suspected underlying obstructive sleep apnea and OHS.  Patient had severe episode of altered mental status during hospitalization in April 2023 with acute respiratory failure requiring intubation and vent support.  Patient was suspected to have underlying sleep apnea and OHS.  She was started on trilogy device at discharge.  Work-up revealed moderate pulmonary hypertension.  DME was contacted.  Patient has been approved for the next year for her trilogy device.   Clinically patient has had significant improvement in symptom burden.  Have recommend that she have a split-night sleep study to evaluate severity of her underlying sleep apnea, however patient declines going for this at this time. Patient is on numerous sedating medications.  Advised to use caution with sedating meds.  - discussed how weight can impact sleep and risk for sleep disordered breathing - discussed options to assist with weight loss: combination of diet modification, cardiovascular and strength training exercises   - had an extensive discussion regarding the adverse health consequences related to untreated sleep disordered breathing - specifically discussed the risks for hypertension, coronary artery disease, cardiac dysrhythmias, cerebrovascular disease, and diabetes - lifestyle modification discussed   - discussed how sleep disruption can increase risk of accidents, particularly when driving - safe driving practices were discussed    .plan  Patient Instructions  Continue on TRILOGY At bedtime with Oxygen 2l/m , wear at bedtime and with naps.  TRILOGY download .  Activity as tolerated.  Continue on Oxygen 2l/m with activity to keep sats >88-90%.  Follow up with Dr. Elsworth Soho in 3 months and As needed   Please contact office for sooner follow up if symptoms do not improve or worsen or seek emergency care        Chronic respiratory failure with hypoxia ([place]) Continue on oxygen to maintain O2 saturations greater than 88 to 90%.  Oxygen demands have decreased since discharge.  Today in the office walk test shows no significant desaturations with ambulation O2 saturations remained 94% on room air with ambulation.  Plan  Patient Instructions  Continue on TRILOGY At bedtime with Oxygen 2l/m , wear at bedtime and with naps.  TRILOGY download .  Activity as tolerated.  Continue on Oxygen 2l/m with activity to keep sats >88-90%.  Follow up with Dr. Elsworth Soho in 3 months and As needed    Please contact office for sooner follow up if symptoms do not improve or worsen or seek emergency care          Rexene Edison, NP 12/26/2021

## 2021-12-26 NOTE — Assessment & Plan Note (Addendum)
Suspected underlying obstructive sleep apnea and OHS.  Patient had severe episode of altered mental status during hospitalization in April 2023 with acute respiratory failure requiring intubation and vent support.  Patient was suspected to have underlying sleep apnea and OHS.  She was started on trilogy device at discharge.  Work-up revealed moderate pulmonary hypertension.  DME was contacted.  Patient has been approved for the next year for her trilogy device.  Clinically patient has had significant improvement in symptom burden.  Have recommend that she have a split-night sleep study to evaluate severity of her underlying sleep apnea, however patient declines going for this at this time. Patient is on numerous sedating medications.  Advised to use caution with sedating meds.  - discussed how weight can impact sleep and risk for sleep disordered breathing - discussed options to assist with weight loss: combination of diet modification, cardiovascular and strength training exercises   - had an extensive discussion regarding the adverse health consequences related to untreated sleep disordered breathing - specifically discussed the risks for hypertension, coronary artery disease, cardiac dysrhythmias, cerebrovascular disease, and diabetes - lifestyle modification discussed   - discussed how sleep disruption can increase risk of accidents, particularly when driving - safe driving practices were discussed    .plan  Patient Instructions  Continue on TRILOGY At bedtime with Oxygen 2l/m , wear at bedtime and with naps.  TRILOGY download .  Activity as tolerated.  Continue on Oxygen 2l/m with activity to keep sats >88-90%.  Follow up with Dr. Elsworth Soho in 3 months and As needed   Please contact office for sooner follow up if symptoms do not improve or worsen or seek emergency care

## 2021-12-26 NOTE — Telephone Encounter (Signed)
Called [facility] with Verdis Frederickson who stated TRILOGY Josem Kaufmann is good until April 2024 and download is being faxed to [phone]

## 2021-12-26 NOTE — Assessment & Plan Note (Signed)
Continue on oxygen to maintain O2 saturations greater than 88 to 90%.  Oxygen demands have decreased since discharge.  Today in the office walk test shows no significant desaturations with ambulation O2 saturations remained 94% on room air with ambulation.  Plan  Patient Instructions  Continue on TRILOGY At bedtime with Oxygen 2l/m , wear at bedtime and with naps.  TRILOGY download .  Activity as tolerated.  Continue on Oxygen 2l/m with activity to keep sats >88-90%.  Follow up with Dr. Elsworth Soho in 3 months and As needed   Please contact office for sooner follow up if symptoms do not improve or worsen or seek emergency care

## 2021-12-26 NOTE — Patient Instructions (Signed)
Continue on TRILOGY At bedtime with Oxygen 2l/m , wear at bedtime and with naps.  TRILOGY download .  Activity as tolerated.  Continue on Oxygen 2l/m with activity to keep sats >88-90%.  Follow up with Dr. Elsworth Soho in 3 months and As needed   Please contact office for sooner follow up if symptoms do not improve or worsen or seek emergency care

## 2021-12-29 ENCOUNTER — Other Ambulatory Visit: Payer: Self-pay | Admitting: Family Medicine

## 2022-01-01 DIAGNOSIS — J984 Other disorders of lung: Secondary | ICD-10-CM | POA: Diagnosis not present

## 2022-01-12 ENCOUNTER — Other Ambulatory Visit: Payer: Self-pay | Admitting: Family Medicine

## 2022-01-15 ENCOUNTER — Telehealth: Payer: Self-pay | Admitting: Gastroenterology

## 2022-01-15 DIAGNOSIS — J189 Pneumonia, unspecified organism: Secondary | ICD-10-CM | POA: Diagnosis not present

## 2022-01-15 DIAGNOSIS — I2609 Other pulmonary embolism with acute cor pulmonale: Secondary | ICD-10-CM | POA: Diagnosis not present

## 2022-01-15 DIAGNOSIS — J9601 Acute respiratory failure with hypoxia: Secondary | ICD-10-CM | POA: Diagnosis not present

## 2022-01-15 MED ORDER — DIPHENOXYLATE-ATROPINE 2.5-0.025 MG PO TABS: 1.0000 | ORAL_TABLET | Freq: Two times a day (BID) | ORAL | 0 refills | Status: DC | PRN

## 2022-01-15 NOTE — Telephone Encounter (Signed)
Inbound call from patient son stating patient needs a prescription refill for Lomotil. Verifying pharmacy is [facility] on [address].

## 2022-01-15 NOTE — Telephone Encounter (Signed)
Re-faxed prescription to patient's pharmacy.

## 2022-01-16 ENCOUNTER — Encounter: Payer: Self-pay | Admitting: Family Medicine

## 2022-01-16 ENCOUNTER — Telehealth (INDEPENDENT_AMBULATORY_CARE_PROVIDER_SITE_OTHER): Payer: PPO | Admitting: Family Medicine

## 2022-01-16 DIAGNOSIS — M5441 Lumbago with sciatica, right side: Secondary | ICD-10-CM

## 2022-01-16 DIAGNOSIS — M5442 Lumbago with sciatica, left side: Secondary | ICD-10-CM

## 2022-01-16 DIAGNOSIS — G8929 Other chronic pain: Secondary | ICD-10-CM | POA: Diagnosis not present

## 2022-01-16 DIAGNOSIS — F1190 Opioid use, unspecified, uncomplicated: Secondary | ICD-10-CM | POA: Diagnosis not present

## 2022-01-16 MED ORDER — HYDROCODONE-ACETAMINOPHEN 10-325 MG PO TABS: 1.0000 | ORAL_TABLET | Freq: Four times a day (QID) | ORAL | 0 refills | Status: DC | PRN

## 2022-01-16 NOTE — Progress Notes (Signed)
Subjective:    Patient ID: [name], female    DOB: 07-Jul-1940, 81 y.o.   MRN: [id number]  HPI Virtual Visit via Video Note  I connected with the patient on 01/16/22 at  1:00 PM EDT by a video enabled telemedicine application and verified that I am speaking with the correct person using two identifiers.  Location patient: home Location provider:work or home office Persons participating in the virtual visit: patient, provider  I discussed the limitations of evaluation and management by telemedicine and the availability of in person appointments. The patient expressed understanding and agreed to proceed.   HPI: Here for pain management. She is doing well. Using her walker to get around the house.    ROS: See pertinent positives and negatives per HPI.  Past Medical History:  Diagnosis Date   Adenomatous colon polyp 02/2006   Allergy    Allergy, unspecified not elsewhere classified    Anemia    Anxiety    Arthritis    Asthma    Blood transfusion without reported diagnosis    Bronchiectasis    CAD (coronary artery disease)    BMS to the LAD, 2002; cath 12/07/11 patent LAD stent and mild nonobstructive disease, EF 65%; Medical management   Cataract    removed both eyes   CHF (congestive heart failure) (HCC)    Diastolic   Chronic diastolic CHF (congestive heart failure) ([place]) 11/25/2011   Clotting disorder ([place])    in legs per pt    Diabetes mellitus    Diverticulosis of colon    DJD (degenerative joint disease)    Fibromyalgia    GERD (gastroesophageal reflux disease)    H. pylori infection 05/12/2019   Hypercholesterolemia    Hypertension    Microscopic hematuria    Neoplasm of kidney    Obesity    Other diseases of lung, not elsewhere classified    Pinched vertebral nerve    Pulmonary nodule    Negative PET in 2010   Stress incontinence, female    Syncope    Ulcerative colitis, left sided ([place]) 2008   Hx of   UTI (lower urinary tract infection)    Vitamin  D deficiency disease     Past Surgical History:  Procedure Laterality Date   ABDOMINAL HYSTERECTOMY     CARDIAC CATHETERIZATION  11/2011   CATARACT EXTRACTION, BILATERAL  2020   CHOLECYSTECTOMY     COLONOSCOPY  01/09/2018   per Dr. Fuller Plan, adenomatous polyps, repeat in 3 yrs   CORONARY STENT PLACEMENT  2002   BMS to the LAD   ESOPHAGOGASTRODUODENOSCOPY  12/2008   HAND SURGERY  01/2006   Right Hand Surgery by Dr. Amedeo Plenty   LEFT HEART CATHETERIZATION WITH CORONARY ANGIOGRAM N/A 12/07/2011   Procedure: LEFT HEART CATHETERIZATION WITH CORONARY ANGIOGRAM;  Surgeon: Peter M Martinique, MD;  Location: [facility] CATH LAB;  Service: Cardiovascular;  Laterality: N/A;   RIGHT/LEFT HEART CATH AND CORONARY ANGIOGRAPHY N/A 08/07/2021   Procedure: RIGHT/LEFT HEART CATH AND CORONARY ANGIOGRAPHY;  Surgeon: Jolaine Artist, MD;  Location: [facility] CV LAB;  Service: Cardiovascular;  Laterality: N/A;   UPPER GASTROINTESTINAL ENDOSCOPY      Family History  Problem Relation Age of Onset   Pneumonia Father    Heart attack Father    Colon polyps Father    Parkinsonism Mother    Diabetes Brother    Sarcoidosis Brother    Hypertension Sister    Diabetes Sister    Breast cancer Daughter  Colon cancer Neg Hx    Esophageal cancer Neg Hx    Rectal cancer Neg Hx    Stomach cancer Neg Hx      Current Outpatient Medications:    acetaminophen (TYLENOL) 500 MG tablet, Take 500 mg by mouth every 6 (six) hours as needed for headache (pain). , Disp: , Rfl:    albuterol (PROVENTIL) (2.5 MG/3ML) 0.083% nebulizer solution, Take 3 mLs (2.5 mg total) by nebulization every 4 (four) hours as needed for wheezing or shortness of breath., Disp: 75 mL, Rfl: 3   albuterol (VENTOLIN HFA) 108 (90 BASE) MCG/ACT inhaler, Inhale 2 puffs into the lungs every 4 (four) hours as needed for wheezing or shortness of breath., Disp: 18 each, Rfl: 11   ALPRAZolam (XANAX) 0.5 MG tablet, TAKE 1 TABLET BY MOUTH 3 TIMES DAILY AS NEEDED., Disp:  90 tablet, Rfl: 5   amLODipine (NORVASC) 10 MG tablet, TAKE 1 TABLET BY MOUTH EVERY DAY, Disp: 90 tablet, Rfl: 3   atorvastatin (LIPITOR) 20 MG tablet, TAKE 1 TABLET (20 MG TOTAL) BY MOUTH DAILY. PLEASE KEEP UPCOMING APPT FOR FUTURE REFILLS., Disp: 90 tablet, Rfl: 3   azelastine (ASTELIN) 0.1 % nasal spray, Place 1 spray into both nostrils at bedtime., Disp: , Rfl:    Blood Glucose Monitoring Suppl (ONETOUCH VERIO FLEX SYSTEM) w/Device KIT, 1 each by Does not apply route daily at 12 noon., Disp: 1 kit, Rfl: 0   Cholecalciferol (VITAMIN D-3) 5000 UNITS TABS, Patient takes 1 tablet by mouth once a week on Mondays., Disp: , Rfl:    clopidogrel (PLAVIX) 75 MG tablet, Take 1 tablet (75 mg total) by mouth daily., Disp: 90 tablet, Rfl: 3   diphenoxylate-atropine (LOMOTIL) 2.5-0.025 MG tablet, Take 1 tablet by mouth 2 (two) times daily as needed., Disp: 60 tablet, Rfl: 0   fluticasone (FLONASE) 50 MCG/ACT nasal spray, PLACE 2 SPRAYS IN EACH NOSTRIL AT BEDTIME, Disp: 16 g, Rfl: 6   gabapentin (NEURONTIN) 300 MG capsule, Take 1 capsule (300 mg total) by mouth 2 (two) times daily., Disp: 60 capsule, Rfl: 5   glimepiride (AMARYL) 1 MG tablet, TAKE 2 TABLETS (2 MG TOTAL) BY MOUTH DAILY WITH BREAKFAST, Disp: 180 tablet, Rfl: 1   glycopyrrolate (ROBINUL) 1 MG tablet, Take 1 tablet (1 mg total) by mouth 3 (three) times daily., Disp: 3 tablet, Rfl: 0   hydrOXYzine (VISTARIL) 25 MG capsule, TAKE 1 CAPSULE (25 MG TOTAL) BY MOUTH EVERY 6 (SIX) HOURS AS NEEDED., Disp: 360 capsule, Rfl: 0   levocetirizine (XYZAL) 5 MG tablet, Take 5 mg by mouth every evening., Disp: , Rfl:    LIDODERM 5 %, APPLY 1 PATCH TO SKIN FOR 12 HOURS THEN REMOVE AND DISCARD PATCH WITHIN 72 HOURS OR AS DIRECTED, Disp: 30 patch, Rfl: 2   loperamide (IMODIUM) 2 MG capsule, Take 4-6 mg by mouth daily., Disp: , Rfl:    losartan (COZAAR) 25 MG tablet, Take 1 tablet (25 mg total) by mouth daily., Disp: 30 tablet, Rfl: 0   magnesium oxide (MAG-OX) 400 MG  tablet, Take 400 mg by mouth 3 (three) times daily., Disp: , Rfl:    mesalamine (LIALDA) 1.2 g EC tablet, TAKE 2 TABLETS (2.4 G TOTAL) BY MOUTH 2 (TWO) TIMES DAILY., Disp: 360 tablet, Rfl: 1   metFORMIN (GLUCOPHAGE-XR) 750 MG 24 hr tablet, TAKE 1 TABLET BY MOUTH EVERY DAY WITH BREAKFAST, Disp: 90 tablet, Rfl: 0   metoprolol succinate (TOPROL XL) 100 MG 24 hr tablet, Take 1 tablet (100  mg total) by mouth daily. Take with or immediately following a meal., Disp: 30 tablet, Rfl: 11   montelukast (SINGULAIR) 10 MG tablet, Take one every morning, Disp: 90 tablet, Rfl: 3   neomycin-polymyxin-hydrocortisone (CORTISPORIN) OTIC solution, Place 4 drops into both ears 4 (four) times daily., Disp: 10 mL, Rfl: 2   nitroGLYCERIN (NITROSTAT) 0.4 MG SL tablet, PLACE 1 TABLET (0.4 MG TOTAL) UNDER THE TONGUE EVERY 5 (FIVE) MINUTES AS NEEDED FOR CHEST PAIN., Disp: 75 tablet, Rfl: 1   Olopatadine HCl 0.7 % SOLN, Place 1 drop into both eyes daily as needed (itching/allergies)., Disp: , Rfl:    ondansetron (ZOFRAN-ODT) 4 MG disintegrating tablet, TAKE 1 TABLET BY MOUTH EVERY 8 HOURS AS NEEDED FOR NAUSEA AND VOMITING, Disp: 30 tablet, Rfl: 1   OneTouch Delica Lancets 21V MISC, 1 each by Does not apply route daily at 12 noon., Disp: 100 each, Rfl: 0   ONETOUCH VERIO test strip, USE TO CHECK BLOOD GLUCOSE ONCE DAILY, Disp: 100 strip, Rfl: 1   Pancrelipase, Lip-Prot-Amyl, (ZENPEP) 40000-126000 units CPEP, Take by mouth. Take 2 tablets by mouth before meals and 1 tablet before snacks, Disp: , Rfl:    pantoprazole (PROTONIX) 40 MG tablet, TAKE 1 TABLET BY MOUTH TWICE A DAY, Disp: 180 tablet, Rfl: 0   Polyethyl Glycol-Propyl Glycol (SYSTANE OP), Place 1 drop into both eyes daily. For dry eyes, Disp: , Rfl:    potassium chloride SA (KLOR-CON M) 20 MEQ tablet, Take 1 tablet (20 mEq total) by mouth 2 (two) times daily., Disp: 60 tablet, Rfl: 11   sitaGLIPtin (JANUVIA) 100 MG tablet, Take 1 tablet (100 mg total) by mouth daily., Disp:  90 tablet, Rfl: 3   tiZANidine (ZANAFLEX) 4 MG tablet, TAKE 1 TABLET BY MOUTH TWICE A DAY, Disp: 180 tablet, Rfl: 3   TOBRADEX ophthalmic solution, Place 1 drop into both eyes 4 (four) times daily. As needed, Disp: , Rfl:    torsemide (DEMADEX) 20 MG tablet, TAKE 2 TABLETS DAILY, Disp: 180 tablet, Rfl: 3   [START ON 03/18/2022] HYDROcodone-acetaminophen (NORCO) 10-325 MG tablet, Take 1 tablet by mouth every 6 (six) hours as needed for moderate pain., Disp: 120 tablet, Rfl: 0  Current Facility-Administered Medications:    ipratropium-albuterol (DUONEB) 0.5-2.5 (3) MG/3ML nebulizer solution 3 mL, 3 mL, Nebulization, Once, Nafziger, Tommi Rumps, NP  EXAM:  VITALS per patient if applicable:  GENERAL: alert, oriented, appears well and in no acute distress  HEENT: atraumatic, conjunttiva clear, no obvious abnormalities on inspection of external nose and ears  NECK: normal movements of the head and neck  LUNGS: on inspection no signs of respiratory distress, breathing rate appears normal, no obvious gross SOB, gasping or wheezing  CV: no obvious cyanosis  MS: moves all visible extremities without noticeable abnormality  PSYCH/NEURO: pleasant and cooperative, no obvious depression or anxiety, speech and thought processing grossly intact  ASSESSMENT AND PLAN: Pain management. Indication for chronic opioid: low back pain  Medication and dose: Norco 10-325 # pills per month: 120 Last UDS date: 03-10-21 Opioid Treatment Agreement signed (Y/N): 10-28-18 Opioid Treatment Agreement last reviewed with patient:  01-16-22 [facility] reviewed this encounter (include red flags): Yes Meds were refilled.  Alysia Penna, MD  Discussed the following assessment and plan:  No diagnosis found.     I discussed the assessment and treatment plan with the patient. The patient was provided an opportunity to ask questions and all were answered. The patient agreed with the plan and demonstrated an understanding of  the  instructions.   The patient was advised to call back or seek an in-person evaluation if the symptoms worsen or if the condition fails to improve as anticipated.      Review of Systems     Objective:   Physical Exam        Assessment & Plan:

## 2022-01-21 ENCOUNTER — Other Ambulatory Visit: Payer: Self-pay | Admitting: Family Medicine

## 2022-01-24 ENCOUNTER — Other Ambulatory Visit: Payer: Self-pay | Admitting: Gastroenterology

## 2022-01-24 ENCOUNTER — Telehealth: Payer: Self-pay | Admitting: Family Medicine

## 2022-01-28 DIAGNOSIS — J9601 Acute respiratory failure with hypoxia: Secondary | ICD-10-CM | POA: Diagnosis not present

## 2022-01-28 DIAGNOSIS — I2609 Other pulmonary embolism with acute cor pulmonale: Secondary | ICD-10-CM | POA: Diagnosis not present

## 2022-01-28 DIAGNOSIS — J189 Pneumonia, unspecified organism: Secondary | ICD-10-CM | POA: Diagnosis not present

## 2022-01-29 NOTE — Telephone Encounter (Signed)
Pt son is call back for her refill request for her ALPRAZolam Duanne Moron) 0.5 MG tablet  he stated she is out.

## 2022-01-29 NOTE — Addendum Note (Signed)
Addended by: Otilio Miu on: 01/29/2022 02:25 PM   Modules accepted: Orders

## 2022-01-29 NOTE — Telephone Encounter (Signed)
I contacted [facility].  No prescription on file for Alprazolam 0.50m.   Please resend.

## 2022-01-30 ENCOUNTER — Telehealth: Payer: Self-pay | Admitting: Cardiology

## 2022-01-30 NOTE — Telephone Encounter (Signed)
Chronic Hypoxic Respiratory Failure OSA - Continue home O2 PRN for O2 for sats < 90%. - Using Trilogy BiPAP. Will refer to Dr. Elsworth Soho for management.

## 2022-01-30 NOTE — Telephone Encounter (Signed)
Pt's son aware he should contact pulmonary division to discuss need for portable O2 tank.

## 2022-01-30 NOTE — Telephone Encounter (Signed)
Pt's son is calling in regards about a smaller oxygen tank. He says this was discussed at last appt, but hasn't heard anything else about it since and would like a call back to discuss this.

## 2022-01-31 DIAGNOSIS — J984 Other disorders of lung: Secondary | ICD-10-CM | POA: Diagnosis not present

## 2022-02-03 ENCOUNTER — Other Ambulatory Visit: Payer: Self-pay | Admitting: Gastroenterology

## 2022-02-05 ENCOUNTER — Telehealth: Payer: Self-pay | Admitting: Adult Health

## 2022-02-05 MED ORDER — ALPRAZOLAM 0.5 MG PO TABS: 0.5000 mg | ORAL_TABLET | Freq: Three times a day (TID) | ORAL | 5 refills | Status: DC | PRN

## 2022-02-05 NOTE — Telephone Encounter (Addendum)
See messages below.   Please resend

## 2022-02-05 NOTE — Addendum Note (Signed)
Addended by: Alysia Penna A on: 02/05/2022 05:27 PM   Modules accepted: Orders

## 2022-02-05 NOTE — Telephone Encounter (Signed)
Pt is calling checking on the status of her alprazolam  [id number]- [place], [place] - [address] Phone:  [phone] Fax:  [phone]

## 2022-02-05 NOTE — Telephone Encounter (Signed)
Called and spoke with pt's son [name] letting him know that we would need to have pt come in for an appt and to be walked at that appt to get things taken care of for a POC and he verbalized understanding. Appt scheduled for pt. Nothing further needed.

## 2022-02-05 NOTE — Telephone Encounter (Signed)
I sent this in again

## 2022-02-14 DIAGNOSIS — J189 Pneumonia, unspecified organism: Secondary | ICD-10-CM | POA: Diagnosis not present

## 2022-02-14 DIAGNOSIS — J9601 Acute respiratory failure with hypoxia: Secondary | ICD-10-CM | POA: Diagnosis not present

## 2022-02-14 DIAGNOSIS — I2609 Other pulmonary embolism with acute cor pulmonale: Secondary | ICD-10-CM | POA: Diagnosis not present

## 2022-02-20 ENCOUNTER — Telehealth: Payer: Self-pay | Admitting: Gastroenterology

## 2022-02-20 MED ORDER — DIPHENOXYLATE-ATROPINE 2.5-0.025 MG PO TABS: 1.0000 | ORAL_TABLET | Freq: Two times a day (BID) | ORAL | 0 refills | Status: DC | PRN

## 2022-02-20 NOTE — Telephone Encounter (Signed)
Inbound call from patients son stating that patient needs a refill for LOMOTIL. Please advise.

## 2022-02-20 NOTE — Telephone Encounter (Signed)
Prescription faxed to patient's pharmacy. 

## 2022-02-22 ENCOUNTER — Ambulatory Visit (INDEPENDENT_AMBULATORY_CARE_PROVIDER_SITE_OTHER): Payer: PPO

## 2022-02-22 ENCOUNTER — Encounter: Payer: Self-pay | Admitting: Adult Health

## 2022-02-22 ENCOUNTER — Ambulatory Visit: Payer: PPO | Admitting: Adult Health

## 2022-02-22 ENCOUNTER — Telehealth: Payer: Self-pay | Admitting: *Deleted

## 2022-02-22 VITALS — BP 108/70 | HR 95 | Temp 98.1°F | Ht 60.0 in | Wt 208.8 lb

## 2022-02-22 DIAGNOSIS — J9611 Chronic respiratory failure with hypoxia: Secondary | ICD-10-CM

## 2022-02-22 DIAGNOSIS — G4733 Obstructive sleep apnea (adult) (pediatric): Secondary | ICD-10-CM | POA: Diagnosis not present

## 2022-02-22 DIAGNOSIS — J4540 Moderate persistent asthma, uncomplicated: Secondary | ICD-10-CM | POA: Diagnosis not present

## 2022-02-22 DIAGNOSIS — J45909 Unspecified asthma, uncomplicated: Secondary | ICD-10-CM | POA: Diagnosis not present

## 2022-02-22 NOTE — Assessment & Plan Note (Signed)
On oxygen to maintain O2 saturations greater than 88 to 90%

## 2022-02-22 NOTE — Progress Notes (Signed)
_0  ID: [name], female    DOB: Oct 04, 1940, 81 y.o.   MRN: [id number]  Chief Complaint  Patient presents with   Follow-up    Referring provider: [name], MD  HPI: 81 year old female seen for sleep consult December 26, 2021 for suspected sleep apnea and OHS Patient was hospitalized April 2023 with altered mental status and acute hypoxic respiratory failure requiring intubation.  Found to have significant pulmonary hypertension discharged on trilogy noninvasive vent and home oxygen Medical history significant for congestive heart failure, coronary disease and diabetes  TEST/EVENTS :  Ltd Echo (4/23): EF 35-40%, LV moderately down with global LV HK, moderate LVH moderate to severe TR  - R/LHC (4/23):Predominantly mild to moderate non-obstructive CAD with 90% lesion in apical LAD 2. Normal LV function 3. Moderate PAH    Dist LAD lesion is 90% stenosed.   Prox RCA lesion is 30% stenosed.   Dist RCA lesion is 30% stenosed.   Prox LAD to Mid LAD lesion is 30% stenosed.   The left ventricular ejection fraction is 55-65% by visual estimate.  --Adapt Verdis Frederickson who stated TRILOGY Josem Kaufmann is good until April 2024   Hospital 07/2021  Found unresponsive by family in April 2023, EMS called and intubated in ED. Hypotensive and bradycardiac. Started on NE, IVF and atropine.  She was treated for RML PNA. Admitted to ICU.  Concern for sepsis +/- CGS, PNA in setting of underlying PAH/cor pulmonale and untreated OSA. POCUS -> Echo LVEF 55%, RV moderately reduced. R/LHC mild to moderate non-obstructive CAD with 90% lesion in apical LAD, normal LV function and moderate PAH with preserved output.  Pressors weaned off and patient extubated. Discharged home on 3L oxygen, weight 209 lbs. Discharged home with Trilogy  machine.  02/22/2022 Follow up : OHS/Respiratory Failure  Patient returns for 41-monthfollow-up.  Patient was admitted April 2023 for altered mental status found unresponsive at home.   On arrival was hypotensive and bradycardic.  Required pressor support and intubation.  She was treated for possible underlying pneumonia with sepsis.  Cardiac work-up revealed nonobstructive coronary disease, moderate pulmonary hypertension and moderate to severe TR.  And hypokinesis with EF of 35 to 40%.  She required discharged on oxygen.  And noninvasive vent for bedtime use for suspected underlying OHS and or sleep apnea..  Says started nocturnal trilogy says that she feels significantly better.  No further confusion.  I discussed setting up for sleep study however her trilogy device is approved through 2024.  She has chronic pain and is on multiple sedating medications including hydrocodone, Xanax, gabapentin, Robinul, hydroxyzine and Zanaflex.  Since last visit patient says she is doing okay.  She is weak with low energy but overall is doing well on trilogy device.  Last visit we recommend a split-night sleep study however patient declined.  She does use oxygen during the daytime and with her trilogy device.  Patient does want a POC.  However today walking in the office she did not have significant desaturations with walking on room air.  She is have underlying asthma and is followed at asthma and allergy.  She remains on Symbicort and [place].  Denies any increased cough or congestion.     Allergies  Allergen Reactions   Doxycycline Other (See Comments)    Unsteady gait   Aspirin Nausea And Vomiting   Azithromycin Other (See Comments)    REACTION: pt states "ZPak doesn't work"   Caffeine Nausea And Vomiting   Ciprofloxacin Nausea And  Vomiting    Tolerates levaquin   Codeine Nausea And Vomiting   Oxycodone Other (See Comments)    Pt stated, "upsets my stomach" (11/19/17)   Sulfamethizole Other (See Comments)    Unknown reaction   Tramadol Hcl Other (See Comments)    REACTION: pt states "spaced-out"    Immunization History  Administered Date(s) Administered   Influenza, High Dose  Seasonal PF 02/17/2019   PFIZER(Purple Top)SARS-COV-2 Vaccination 03/17/2020, 04/09/2020   Pneumococcal Conjugate-13 03/15/2015   Pneumococcal Polysaccharide-23 05/21/2006, 02/23/2015, 05/21/2016, 08/14/2018, 02/17/2019, 03/17/2020, 10/17/2020    Past Medical History:  Diagnosis Date   Adenomatous colon polyp 02/2006   Allergy    Allergy, unspecified not elsewhere classified    Anemia    Anxiety    Arthritis    Asthma    Blood transfusion without reported diagnosis    Bronchiectasis    CAD (coronary artery disease)    BMS to the LAD, 2002; cath 12/07/11 patent LAD stent and mild nonobstructive disease, EF 65%; Medical management   Cataract    removed both eyes   CHF (congestive heart failure) (HCC)    Diastolic   Chronic diastolic CHF (congestive heart failure) ([place]) 11/25/2011   Clotting disorder ([place])    in legs per pt    Diabetes mellitus    Diverticulosis of colon    DJD (degenerative joint disease)    Fibromyalgia    GERD (gastroesophageal reflux disease)    H. pylori infection 05/12/2019   Hypercholesterolemia    Hypertension    Microscopic hematuria    Neoplasm of kidney    Obesity    Other diseases of lung, not elsewhere classified    Pinched vertebral nerve    Pulmonary nodule    Negative PET in 2010   Stress incontinence, female    Syncope    Ulcerative colitis, left sided ([place]) 2008   Hx of   UTI (lower urinary tract infection)    Vitamin D deficiency disease     Tobacco History: Social History   Tobacco Use  Smoking Status Former   Packs/day: 2.00   Years: 15.00   Total pack years: 30.00   Types: Cigarettes   Quit date: 04/30/1973   Years since quitting: 48.8  Smokeless Tobacco Never  Tobacco Comments   ex-smoker: smoked for 10-15 years up to 2ppd, quit 1975.   Counseling given: Not Answered Tobacco comments: ex-smoker: smoked for 10-15 years up to 2ppd, quit 1975.   Outpatient Medications Prior to Visit  Medication Sig Dispense Refill    acetaminophen (TYLENOL) 500 MG tablet Take 500 mg by mouth every 6 (six) hours as needed for headache (pain).      albuterol (PROVENTIL) (2.5 MG/3ML) 0.083% nebulizer solution Take 3 mLs (2.5 mg total) by nebulization every 4 (four) hours as needed for wheezing or shortness of breath. 75 mL 3   albuterol (VENTOLIN HFA) 108 (90 BASE) MCG/ACT inhaler Inhale 2 puffs into the lungs every 4 (four) hours as needed for wheezing or shortness of breath. 18 each 11   ALPRAZolam (XANAX) 0.5 MG tablet Take 1 tablet (0.5 mg total) by mouth 3 (three) times daily as needed. 90 tablet 5   amLODipine (NORVASC) 10 MG tablet TAKE 1 TABLET BY MOUTH EVERY DAY 90 tablet 3   atorvastatin (LIPITOR) 20 MG tablet TAKE 1 TABLET (20 MG TOTAL) BY MOUTH DAILY. PLEASE KEEP UPCOMING APPT FOR FUTURE REFILLS. 90 tablet 3   azelastine (ASTELIN) 0.1 % nasal spray Place 1 spray into  both nostrils at bedtime.     Blood Glucose Monitoring Suppl (ONETOUCH VERIO FLEX SYSTEM) w/Device KIT 1 each by Does not apply route daily at 12 noon. 1 kit 0   Cholecalciferol (VITAMIN D-3) 5000 UNITS TABS Patient takes 1 tablet by mouth once a week on Mondays.     clopidogrel (PLAVIX) 75 MG tablet Take 1 tablet (75 mg total) by mouth daily. 90 tablet 3   diphenoxylate-atropine (LOMOTIL) 2.5-0.025 MG tablet Take 1 tablet by mouth 2 (two) times daily as needed. 60 tablet 0   fluticasone (FLONASE) 50 MCG/ACT nasal spray PLACE 2 SPRAYS IN EACH NOSTRIL AT BEDTIME 16 g 6   gabapentin (NEURONTIN) 300 MG capsule Take 1 capsule (300 mg total) by mouth 2 (two) times daily. 60 capsule 5   glimepiride (AMARYL) 1 MG tablet TAKE 2 TABLETS (2 MG TOTAL) BY MOUTH DAILY WITH BREAKFAST 180 tablet 1   glycopyrrolate (ROBINUL) 1 MG tablet Take 1 tablet (1 mg total) by mouth 3 (three) times daily. 3 tablet 0   [START ON 03/18/2022] HYDROcodone-acetaminophen (NORCO) 10-325 MG tablet Take 1 tablet by mouth every 6 (six) hours as needed for moderate pain. 120 tablet 0    hydrOXYzine (VISTARIL) 25 MG capsule TAKE 1 CAPSULE (25 MG TOTAL) BY MOUTH EVERY 6 (SIX) HOURS AS NEEDED. 360 capsule 0   levocetirizine (XYZAL) 5 MG tablet Take 5 mg by mouth every evening.     LIDODERM 5 % APPLY 1 PATCH TO SKIN FOR 12 HOURS THEN REMOVE AND DISCARD PATCH WITHIN 72 HOURS OR AS DIRECTED 30 patch 2   loperamide (IMODIUM) 2 MG capsule Take 4-6 mg by mouth daily.     losartan (COZAAR) 25 MG tablet Take 1 tablet (25 mg total) by mouth daily. 30 tablet 0   magnesium oxide (MAG-OX) 400 MG tablet Take 400 mg by mouth 3 (three) times daily.     mesalamine (LIALDA) 1.2 g EC tablet TAKE 2 TABLETS BY MOUTH 2 TIMES DAILY. 360 tablet 1   metFORMIN (GLUCOPHAGE-XR) 750 MG 24 hr tablet TAKE 1 TABLET BY MOUTH EVERY DAY WITH BREAKFAST 90 tablet 0   metoprolol succinate (TOPROL XL) 100 MG 24 hr tablet Take 1 tablet (100 mg total) by mouth daily. Take with or immediately following a meal. 30 tablet 11   montelukast (SINGULAIR) 10 MG tablet Take one every morning 90 tablet 3   neomycin-polymyxin-hydrocortisone (CORTISPORIN) OTIC solution Place 4 drops into both ears 4 (four) times daily. 10 mL 2   nitroGLYCERIN (NITROSTAT) 0.4 MG SL tablet PLACE 1 TABLET (0.4 MG TOTAL) UNDER THE TONGUE EVERY 5 (FIVE) MINUTES AS NEEDED FOR CHEST PAIN. 75 tablet 1   Olopatadine HCl 0.7 % SOLN Place 1 drop into both eyes daily as needed (itching/allergies).     ondansetron (ZOFRAN-ODT) 4 MG disintegrating tablet TAKE 1 TABLET BY MOUTH EVERY 8 HOURS AS NEEDED FOR NAUSEA AND VOMITING 30 tablet 1   OneTouch Delica Lancets 24M MISC 1 each by Does not apply route daily at 12 noon. 100 each 0   ONETOUCH VERIO test strip USE TO CHECK BLOOD GLUCOSE ONCE DAILY 100 strip 1   Pancrelipase, Lip-Prot-Amyl, (ZENPEP) 40000-126000 units CPEP Take by mouth. Take 2 tablets by mouth before meals and 1 tablet before snacks     pantoprazole (PROTONIX) 40 MG tablet TAKE 1 TABLET BY MOUTH TWICE A DAY 180 tablet 0   Polyethyl Glycol-Propyl Glycol  (SYSTANE OP) Place 1 drop into both eyes daily. For dry eyes  potassium chloride SA (KLOR-CON M) 20 MEQ tablet Take 1 tablet (20 mEq total) by mouth 2 (two) times daily. 60 tablet 11   sitaGLIPtin (JANUVIA) 100 MG tablet Take 1 tablet (100 mg total) by mouth daily. 90 tablet 3   tiZANidine (ZANAFLEX) 4 MG tablet TAKE 1 TABLET BY MOUTH TWICE A DAY 180 tablet 3   TOBRADEX ophthalmic solution Place 1 drop into both eyes 4 (four) times daily. As needed     torsemide (DEMADEX) 20 MG tablet TAKE 2 TABLETS DAILY 180 tablet 3   Facility-Administered Medications Prior to Visit  Medication Dose Route Frequency Provider Last Rate Last Admin   ipratropium-albuterol (DUONEB) 0.5-2.5 (3) MG/3ML nebulizer solution 3 mL  3 mL Nebulization Once Nafziger, Cory, NP         Review of Systems:   Constitutional:   No  weight loss, night sweats,  Fevers, chills,  +fatigue, or  lassitude.  HEENT:   No headaches,  Difficulty swallowing,  Tooth/dental problems, or  Sore throat,                No sneezing, itching, ear ache, nasal congestion, post nasal drip,   CV:  No chest pain,  Orthopnea, PND, swelling in lower extremities, anasarca, dizziness, palpitations, syncope.   GI  No heartburn, indigestion, abdominal pain, nausea, vomiting, diarrhea, change in bowel habits, loss of appetite, bloody stools.   Resp:   No chest wall deformity  Skin: no rash or lesions.  GU: no dysuria, change in color of urine, no urgency or frequency.  No flank pain, no hematuria   MS:  No joint pain or swelling.  No decreased range of motion.  No back pain.    Physical Exam  BP 108/70 (BP Location: Right Arm, Patient Position: Sitting, Cuff Size: Large)   Pulse 95   Temp 98.1 F (36.7 C) (Oral)   Ht 5' (1.524 m)   Wt 208 lb 12.8 oz (94.7 kg)   LMP  (LMP Unknown)   BMI 40.78 kg/m   GEN: A/Ox3; pleasant , NAD, elderly    HEENT:  [place]/AT,   NOSE-clear, THROAT-clear, no lesions, no postnasal drip or exudate noted.    NECK:  Supple w/ fair ROM; no JVD; normal carotid impulses w/o bruits; no thyromegaly or nodules palpated; no lymphadenopathy.    RESP  Clear  P & A; w/o, wheezes/ rales/ or rhonchi. no accessory muscle use, no dullness to percussion  CARD:  RRR, no m/r/g, tr  peripheral edema, pulses intact, no cyanosis or clubbing.  GI:   Soft & nt; nml bowel sounds; no organomegaly or masses detected.   Musco: Warm bil, no deformities or joint swelling noted.   Neuro: alert, no focal deficits noted.    Skin: Warm, no lesions or rashes    Lab Results:       Imaging: No results found.       Latest Ref Rng & Units 08/10/2021    2:52 PM  PFT Results  FVC-Pre L 1.14   FVC-Predicted Pre % 65   Pre FEV1/FVC % % 77   FEV1-Pre L 0.87   FEV1-Predicted Pre % 65     No results found for: "NITRICOXIDE"      Assessment & Plan:   OSA (obstructive sleep apnea) Suspected sleep apnea and OHS.  Patient had a significant severe episode with altered mental status during hospitalization in April 2023 with acute respiratory failure requiring intubation and vent support.  She was started on  nocturnal trilogy device which is made a significant improvement.  Work-up did reveal moderate pulmonary hypertension.  Patient declines split-night sleep study.  She does have approval for her trilogy device through 2024.  We will continue on current settings.  Trilogy download has been requested. Use caution as she is on multiple sedating medications  Plan  Patient Instructions  Continue on TRILOGY At bedtime with Oxygen 2l/m , wear at bedtime and with naps.  TRILOGY download .  Activity as tolerated.  Continue on Symbicort 2 puffs Twice daily. Rinse after use.  Albuterol inhaler or neb As needed   Chest xray today .  Continue on Oxygen 2l/m with activity to keep sats >88-90%.  Follow up with Dr. Elsworth Soho in 3 months and As needed   Please contact office for sooner follow up if symptoms do not improve or  worsen or seek emergency care        Asthma Compensated on present regimen.  Continue to follow with asthma and allergy  Chronic respiratory failure with hypoxia ([place]) On oxygen to maintain O2 saturations greater than 88 to 90%     Rexene Edison, NP 02/22/2022

## 2022-02-22 NOTE — Telephone Encounter (Signed)
LVM for Brad with [employer] requesting a download for patient's vent.

## 2022-02-22 NOTE — Assessment & Plan Note (Signed)
Compensated on present regimen.  Continue to follow with asthma and allergy

## 2022-02-22 NOTE — Patient Instructions (Addendum)
Continue on TRILOGY At bedtime with Oxygen 2l/m , wear at bedtime and with naps.  TRILOGY download .  Activity as tolerated.  Continue on Symbicort 2 puffs Twice daily. Rinse after use.  Albuterol inhaler or neb As needed   Chest xray today .  Continue on Oxygen 2l/m with activity to keep sats >88-90%.  Follow up with Dr. Elsworth Soho in 3 months and As needed   Please contact office for sooner follow up if symptoms do not improve or worsen or seek emergency care

## 2022-02-22 NOTE — Assessment & Plan Note (Signed)
Suspected sleep apnea and OHS.  Patient had a significant severe episode with altered mental status during hospitalization in April 2023 with acute respiratory failure requiring intubation and vent support.  She was started on nocturnal trilogy device which is made a significant improvement.  Work-up did reveal moderate pulmonary hypertension.  Patient declines split-night sleep study.  She does have approval for her trilogy device through 2024.  We will continue on current settings.  Trilogy download has been requested. Use caution as she is on multiple sedating medications  Plan  Patient Instructions  Continue on TRILOGY At bedtime with Oxygen 2l/m , wear at bedtime and with naps.  TRILOGY download .  Activity as tolerated.  Continue on Symbicort 2 puffs Twice daily. Rinse after use.  Albuterol inhaler or neb As needed   Chest xray today .  Continue on Oxygen 2l/m with activity to keep sats >88-90%.  Follow up with Dr. Elsworth Soho in 3 months and As needed   Please contact office for sooner follow up if symptoms do not improve or worsen or seek emergency care

## 2022-03-01 ENCOUNTER — Other Ambulatory Visit: Payer: Self-pay | Admitting: Cardiology

## 2022-03-01 ENCOUNTER — Other Ambulatory Visit: Payer: Self-pay | Admitting: Family Medicine

## 2022-03-03 DIAGNOSIS — J984 Other disorders of lung: Secondary | ICD-10-CM | POA: Diagnosis not present

## 2022-03-07 ENCOUNTER — Telehealth: Payer: Self-pay | Admitting: Gastroenterology

## 2022-03-07 ENCOUNTER — Other Ambulatory Visit: Payer: Self-pay

## 2022-03-07 MED ORDER — GLYCOPYRROLATE 1 MG PO TABS: 1.0000 mg | ORAL_TABLET | Freq: Two times a day (BID) | ORAL | 3 refills | Status: DC

## 2022-03-07 NOTE — Telephone Encounter (Signed)
Patient'[phone] son [name] called states that he has question regarding patient medication. Requesting a call back on [phone].Please call to advise.

## 2022-03-07 NOTE — Telephone Encounter (Signed)
Spoke with patient's son & he was asking for refill to be sent in for patient's robinUl. Refill sent in as prescribed at time of last OV.

## 2022-03-12 ENCOUNTER — Other Ambulatory Visit: Payer: Self-pay

## 2022-03-12 MED ORDER — GLYCOPYRROLATE 1 MG PO TABS: 1.0000 mg | ORAL_TABLET | Freq: Two times a day (BID) | ORAL | 3 refills | Status: DC

## 2022-03-12 NOTE — Telephone Encounter (Signed)
Patient called states the robinUI requires PA.

## 2022-03-13 ENCOUNTER — Other Ambulatory Visit (HOSPITAL_COMMUNITY): Payer: Self-pay

## 2022-03-13 ENCOUNTER — Telehealth: Payer: Self-pay | Admitting: Pharmacy Technician

## 2022-03-13 NOTE — Telephone Encounter (Signed)
Received a fax regarding Prior Authorization from [employer] for GLYCOPYRROLATE 1MG. Authorization has been DENIED because:     Received notification from [place] that prior authorization for GLYCOPYRROLATE 1MG is required.   PA submitted on 11.14.23 Key [id number] Status is pending    Luciano Cutter, CPhT Patient Advocate Phone: [phone]

## 2022-03-14 ENCOUNTER — Telehealth: Payer: Self-pay | Admitting: Adult Health

## 2022-03-14 NOTE — Telephone Encounter (Signed)
Please see denial reason

## 2022-03-14 NOTE — Telephone Encounter (Signed)
Trilogy download September 24 through February 20, 2022 shows 77% usage.  Daily average usage at 7 hours.  Continue to wear your trilogy ventilator at nighttime and try to wear all night long.  Keep up the good work

## 2022-03-14 NOTE — Telephone Encounter (Signed)
Patient's glycopyrrolate has been denied. Do you want to switch her to another antispasmodic?

## 2022-03-15 ENCOUNTER — Other Ambulatory Visit: Payer: Self-pay | Admitting: Family Medicine

## 2022-03-15 DIAGNOSIS — E119 Type 2 diabetes mellitus without complications: Secondary | ICD-10-CM

## 2022-03-17 DIAGNOSIS — J189 Pneumonia, unspecified organism: Secondary | ICD-10-CM | POA: Diagnosis not present

## 2022-03-17 DIAGNOSIS — J9601 Acute respiratory failure with hypoxia: Secondary | ICD-10-CM | POA: Diagnosis not present

## 2022-03-17 DIAGNOSIS — I2609 Other pulmonary embolism with acute cor pulmonale: Secondary | ICD-10-CM | POA: Diagnosis not present

## 2022-03-19 ENCOUNTER — Telehealth: Payer: Self-pay | Admitting: Family Medicine

## 2022-03-19 MED ORDER — HYDROCODONE-ACETAMINOPHEN 10-325 MG PO TABS: 1.0000 | ORAL_TABLET | Freq: Four times a day (QID) | ORAL | 0 refills | Status: AC | PRN

## 2022-03-19 NOTE — Telephone Encounter (Signed)
Done for 30 days

## 2022-03-19 NOTE — Telephone Encounter (Signed)
Hyoscyamine 0.375 mg po bid

## 2022-03-19 NOTE — Telephone Encounter (Signed)
Disregard this message

## 2022-03-19 NOTE — Telephone Encounter (Signed)
Pt son is calling and [facility] is out of stock HYDROcodone-acetaminophen ([place]) 10-325 MG tablet  please send new rx to [facility] [place] [facility] [address] [place] Phone: [phone]  Fax: [phone]

## 2022-03-19 NOTE — Telephone Encounter (Signed)
Pt daughter call and stated pt  pharmacy is out of the HYDROcodone-acetaminophen (NORCO) 10-325 MG tablet and stated [facility] on [address] have it and want you to sent a new RX to [facility]

## 2022-03-20 MED ORDER — HYOSCYAMINE SULFATE ER 0.375 MG PO TB12: 0.3750 mg | ORAL_TABLET | Freq: Two times a day (BID) | ORAL | 5 refills | Status: DC

## 2022-03-20 NOTE — Telephone Encounter (Signed)
Informed patient the glycopyrrolate was denied by her insurance company. Informed patient that we will send in another medication in it's place and to let us know if this medication does not work as well. Patient verbalized understanding.

## 2022-03-26 ENCOUNTER — Telehealth: Payer: Self-pay | Admitting: Gastroenterology

## 2022-03-26 MED ORDER — DIPHENOXYLATE-ATROPINE 2.5-0.025 MG PO TABS: 1.0000 | ORAL_TABLET | Freq: Two times a day (BID) | ORAL | 0 refills | Status: DC | PRN

## 2022-03-26 NOTE — Telephone Encounter (Signed)
Patient's son is calling states his mother is in need of her monthly refill of Lomotil. Please advise

## 2022-03-26 NOTE — Telephone Encounter (Signed)
Prescription faxed to patient's pharmacy.

## 2022-03-28 NOTE — Telephone Encounter (Signed)
Called and spoke with patient, advised of results/recommendation per Rexene Edison NP.  Nothing further needed.

## 2022-04-02 ENCOUNTER — Other Ambulatory Visit (HOSPITAL_COMMUNITY): Payer: PPO

## 2022-04-02 DIAGNOSIS — J984 Other disorders of lung: Secondary | ICD-10-CM | POA: Diagnosis not present

## 2022-04-04 ENCOUNTER — Other Ambulatory Visit (HOSPITAL_COMMUNITY): Payer: Self-pay

## 2022-04-04 ENCOUNTER — Other Ambulatory Visit: Payer: Self-pay | Admitting: Family Medicine

## 2022-04-13 ENCOUNTER — Other Ambulatory Visit: Payer: Self-pay | Admitting: Cardiology

## 2022-04-16 DIAGNOSIS — J9601 Acute respiratory failure with hypoxia: Secondary | ICD-10-CM | POA: Diagnosis not present

## 2022-04-16 DIAGNOSIS — I2609 Other pulmonary embolism with acute cor pulmonale: Secondary | ICD-10-CM | POA: Diagnosis not present

## 2022-04-16 DIAGNOSIS — J189 Pneumonia, unspecified organism: Secondary | ICD-10-CM | POA: Diagnosis not present

## 2022-04-17 MED ORDER — DIPHENOXYLATE-ATROPINE 2.5-0.025 MG PO TABS: 1.0000 | ORAL_TABLET | Freq: Two times a day (BID) | ORAL | 0 refills | Status: DC | PRN

## 2022-04-17 NOTE — Telephone Encounter (Signed)
Patient son in calling to get patient's monthly refill of Lomotil. Please advise

## 2022-04-17 NOTE — Telephone Encounter (Signed)
Prescription for Lomotil sent to patient's pharmacy.

## 2022-04-17 NOTE — Addendum Note (Signed)
Addended by: Dorisann Frames L on: 04/17/2022 11:19 AM   Modules accepted: Orders

## 2022-04-24 ENCOUNTER — Other Ambulatory Visit: Payer: Self-pay | Admitting: Family Medicine

## 2022-04-25 ENCOUNTER — Other Ambulatory Visit: Payer: Self-pay | Admitting: Family Medicine

## 2022-04-25 ENCOUNTER — Other Ambulatory Visit: Payer: Self-pay | Admitting: Gastroenterology

## 2022-04-25 NOTE — Telephone Encounter (Signed)
Ok to refill 

## 2022-04-25 NOTE — Telephone Encounter (Signed)
Last refill for Magnesium last send by historical provider.    Last VV-01/16/22  Okay for refill?

## 2022-04-25 NOTE — Telephone Encounter (Signed)
As [place] please advise Sir. Patient was seen in May 2023 by Dr Fuller Plan.

## 2022-04-26 ENCOUNTER — Ambulatory Visit (HOSPITAL_COMMUNITY): Payer: PPO | Attending: Cardiology

## 2022-04-26 DIAGNOSIS — I5020 Unspecified systolic (congestive) heart failure: Secondary | ICD-10-CM | POA: Diagnosis not present

## 2022-04-26 LAB — ECHOCARDIOGRAM COMPLETE
Area-P 1/2: 3.89 cm2
MV M vel: 5.28 m/s
MV Peak grad: 111.5 mmHg
S' Lateral: 2.7 cm

## 2022-04-29 ENCOUNTER — Other Ambulatory Visit: Payer: Self-pay | Admitting: Cardiology

## 2022-05-03 DIAGNOSIS — J984 Other disorders of lung: Secondary | ICD-10-CM | POA: Diagnosis not present

## 2022-05-04 ENCOUNTER — Other Ambulatory Visit: Payer: Self-pay | Admitting: Gastroenterology

## 2022-05-08 DIAGNOSIS — M25512 Pain in left shoulder: Secondary | ICD-10-CM | POA: Diagnosis not present

## 2022-05-09 ENCOUNTER — Other Ambulatory Visit: Payer: Self-pay | Admitting: Cardiology

## 2022-05-17 DIAGNOSIS — J189 Pneumonia, unspecified organism: Secondary | ICD-10-CM | POA: Diagnosis not present

## 2022-05-17 DIAGNOSIS — I2609 Other pulmonary embolism with acute cor pulmonale: Secondary | ICD-10-CM | POA: Diagnosis not present

## 2022-05-17 DIAGNOSIS — J9601 Acute respiratory failure with hypoxia: Secondary | ICD-10-CM | POA: Diagnosis not present

## 2022-05-22 ENCOUNTER — Other Ambulatory Visit: Payer: Self-pay | Admitting: Urology

## 2022-05-22 DIAGNOSIS — N281 Cyst of kidney, acquired: Secondary | ICD-10-CM

## 2022-05-24 ENCOUNTER — Telehealth: Payer: Self-pay | Admitting: Gastroenterology

## 2022-05-24 ENCOUNTER — Ambulatory Visit (INDEPENDENT_AMBULATORY_CARE_PROVIDER_SITE_OTHER): Payer: PPO

## 2022-05-24 VITALS — Ht 60.0 in | Wt 205.0 lb

## 2022-05-24 DIAGNOSIS — Z0000 Encounter for general adult medical examination without abnormal findings: Secondary | ICD-10-CM

## 2022-05-24 MED ORDER — DIPHENOXYLATE-ATROPINE 2.5-0.025 MG PO TABS: ORAL_TABLET | ORAL | 0 refills | Status: DC

## 2022-05-24 NOTE — Progress Notes (Signed)
Subjective:   [name] is a 82 y.o. female who presents for Medicare Annual (Subsequent) preventive examination.  Review of Systems    Virtual Visit via Telephone Note  I connected with  [name] on 05/24/22 at  9:00 AM EST by telephone and verified that I am speaking with the correct person using two identifiers.  Location: Patient: Home Provider: Office Persons participating in the virtual visit: patient/Nurse Health Advisor   I discussed the limitations, risks, security and privacy concerns of performing an evaluation and management service by telephone and the availability of in person appointments. The patient expressed understanding and agreed to proceed.  Interactive audio and video telecommunications were attempted between this nurse and patient, however failed, due to patient having technical difficulties OR patient did not have access to video capability.  We continued and completed visit with audio only.  Some vital signs may be absent or patient reported.   Criselda Peaches, LPN  Cardiac Risk Factors include: advanced age (>45mn, >>20women);diabetes mellitus;hypertension     Objective:    Today's Vitals   05/24/22 0901  Weight: 205 lb (93 kg)  Height: 5' (1.524 m)   Body mass index is 40.04 kg/m.     05/24/2022    9:15 AM 05/22/2021    9:15 AM 05/11/2020    9:54 AM 06/08/2019   12:51 PM 12/16/2016    8:06 PM 12/16/2016    2:35 PM 06/24/2014    6:47 AM  Advanced Directives  Does Patient Have a Medical Advance Directive? _0  No   Does patient want to make changes to medical advance directive?  No - Patient declined       Would patient like information on creating a medical advance directive? No - Patient declined  No - Patient declined  No - Patient declined  No - patient declined information    Current Medications (verified) Outpatient Encounter Medications as of 05/24/2022  Medication Sig   acetaminophen (TYLENOL) 500 MG tablet Take 500  mg by mouth every 6 (six) hours as needed for headache (pain).    albuterol (PROVENTIL) (2.5 MG/3ML) 0.083% nebulizer solution Take 3 mLs (2.5 mg total) by nebulization every 4 (four) hours as needed for wheezing or shortness of breath.   albuterol (VENTOLIN HFA) 108 (90 BASE) MCG/ACT inhaler Inhale 2 puffs into the lungs every 4 (four) hours as needed for wheezing or shortness of breath.   ALPRAZolam (XANAX) 0.5 MG tablet Take 1 tablet (0.5 mg total) by mouth 3 (three) times daily as needed.   amLODipine (NORVASC) 10 MG tablet TAKE 1 TABLET BY MOUTH EVERY DAY   atorvastatin (LIPITOR) 20 MG tablet TAKE 1 TABLET (20 MG TOTAL) BY MOUTH DAILY. PLEASE CALL [phone] SCHEDULE AN APPOINTMENT FOR FUTURE REFILLS. THANK YOU. 1ST ATTEMPT.   azelastine (ASTELIN) 0.1 % nasal spray Place 1 spray into both nostrils at bedtime.   Blood Glucose Monitoring Suppl (ONETOUCH VERIO FLEX SYSTEM) w/Device KIT 1 each by Does not apply route daily at 12 noon.   Cholecalciferol (VITAMIN D-3) 5000 UNITS TABS Patient takes 1 tablet by mouth once a week on Mondays.   clopidogrel (PLAVIX) 75 MG tablet TAKE 1 TABLET BY MOUTH EVERY DAY   diphenoxylate-atropine (LOMOTIL) 2.5-0.025 MG tablet TAKE 1 TABLET BY MOUTH 2 TIMES DAILY AS NEEDED   fluticasone (FLONASE) 50 MCG/ACT nasal spray PLACE 2 SPRAYS IN EACH NOSTRIL AT BEDTIME   gabapentin (NEURONTIN) 300 MG capsule TAKE 1 CAPSULE BY  MOUTH TWICE A DAY   glimepiride (AMARYL) 1 MG tablet TAKE 2 TABLETS (2 MG TOTAL) BY MOUTH DAILY WITH BREAKFAST   hydrOXYzine (VISTARIL) 25 MG capsule TAKE 1 CAPSULE (25 MG TOTAL) BY MOUTH EVERY 6 (SIX) HOURS AS NEEDED.   hyoscyamine (LEVBID) 0.375 MG 12 hr tablet Take 1 tablet (0.375 mg total) by mouth 2 (two) times daily.   levocetirizine (XYZAL) 5 MG tablet Take 5 mg by mouth every evening.   LIDODERM 5 % APPLY 1 PATCH TO SKIN FOR 12 HOURS THEN REMOVE AND DISCARD PATCH WITHIN 72 HOURS OR AS DIRECTED   loperamide (IMODIUM) 2 MG capsule Take 4-6 mg  by mouth daily.   losartan (COZAAR) 25 MG tablet Take 1 tablet (25 mg total) by mouth daily.   magnesium oxide (MAG-OX) 400 MG tablet Take 3 tablets (1,200 mg total) by mouth daily.   mesalamine (LIALDA) 1.2 g EC tablet TAKE 2 TABLETS BY MOUTH 2 TIMES DAILY.   metFORMIN (GLUCOPHAGE-XR) 750 MG 24 hr tablet Take 1 tablet (750 mg total) by mouth daily with breakfast.   metoprolol succinate (TOPROL XL) 100 MG 24 hr tablet Take 1 tablet (100 mg total) by mouth daily. Take with or immediately following a meal.   montelukast (SINGULAIR) 10 MG tablet Take one every morning   neomycin-polymyxin-hydrocortisone (CORTISPORIN) OTIC solution Place 4 drops into both ears 4 (four) times daily.   nitroGLYCERIN (NITROSTAT) 0.4 MG SL tablet PLACE 1 TABLET (0.4 MG TOTAL) UNDER THE TONGUE EVERY 5 (FIVE) MINUTES AS NEEDED FOR CHEST PAIN.   Olopatadine HCl 0.7 % SOLN Place 1 drop into both eyes daily as needed (itching/allergies).   ondansetron (ZOFRAN-ODT) 4 MG disintegrating tablet TAKE 1 TABLET BY MOUTH EVERY 8 HOURS AS NEEDED FOR NAUSEA AND VOMITING   OneTouch Delica Lancets 16W MISC 1 each by Does not apply route daily at 12 noon.   ONETOUCH VERIO test strip USE TO CHECK BLOOD GLUCOSE ONCE DAILY   Pancrelipase, Lip-Prot-Amyl, (ZENPEP) 40000-126000 units CPEP Take by mouth. Take 2 tablets by mouth before meals and 1 tablet before snacks   pantoprazole (PROTONIX) 40 MG tablet TAKE 1 TABLET BY MOUTH TWICE A DAY   Polyethyl Glycol-Propyl Glycol (SYSTANE OP) Place 1 drop into both eyes daily. For dry eyes   potassium chloride SA (KLOR-CON M) 20 MEQ tablet Take 1 tablet (20 mEq total) by mouth 2 (two) times daily.   sitaGLIPtin (JANUVIA) 100 MG tablet Take 1 tablet (100 mg total) by mouth daily.   tiZANidine (ZANAFLEX) 4 MG tablet TAKE 1 TABLET BY MOUTH TWICE A DAY   TOBRADEX ophthalmic solution Place 1 drop into both eyes 4 (four) times daily. As needed   torsemide (DEMADEX) 20 MG tablet TAKE 2 TABLETS DAILY    Facility-Administered Encounter Medications as of 05/24/2022  Medication   ipratropium-albuterol (DUONEB) 0.5-2.5 (3) MG/3ML nebulizer solution 3 mL    Allergies (verified) Doxycycline, Aspirin, Azithromycin, Caffeine, Ciprofloxacin, Codeine, Oxycodone, Sulfamethizole, and Tramadol hcl   History: Past Medical History:  Diagnosis Date   Adenomatous colon polyp 02/2006   Allergy    Allergy, unspecified not elsewhere classified    Anemia    Anxiety    Arthritis    Asthma    Blood transfusion without reported diagnosis    Bronchiectasis    CAD (coronary artery disease)    BMS to the LAD, 2002; cath 12/07/11 patent LAD stent and mild nonobstructive disease, EF 65%; Medical management   Cataract    removed both eyes  CHF (congestive heart failure) (HCC)    Diastolic   Chronic diastolic CHF (congestive heart failure) ([place]) 11/25/2011   Clotting disorder ([place])    in legs per pt    Diabetes mellitus    Diverticulosis of colon    DJD (degenerative joint disease)    Fibromyalgia    GERD (gastroesophageal reflux disease)    H. pylori infection 05/12/2019   Hypercholesterolemia    Hypertension    Microscopic hematuria    Neoplasm of kidney    Obesity    Other diseases of lung, not elsewhere classified    Pinched vertebral nerve    Pulmonary nodule    Negative PET in 2010   Stress incontinence, female    Syncope    Ulcerative colitis, left sided ([place]) 2008   Hx of   UTI (lower urinary tract infection)    Vitamin D deficiency disease    Past Surgical History:  Procedure Laterality Date   ABDOMINAL HYSTERECTOMY     CARDIAC CATHETERIZATION  11/2011   CATARACT EXTRACTION, BILATERAL  2020   CHOLECYSTECTOMY     COLONOSCOPY  01/09/2018   per Dr. Fuller Plan, adenomatous polyps, repeat in 3 yrs   CORONARY STENT PLACEMENT  2002   BMS to the LAD   ESOPHAGOGASTRODUODENOSCOPY  12/2008   HAND SURGERY  01/2006   Right Hand Surgery by Dr. Amedeo Plenty   LEFT HEART CATHETERIZATION WITH CORONARY  ANGIOGRAM N/A 12/07/2011   Procedure: LEFT HEART CATHETERIZATION WITH CORONARY ANGIOGRAM;  Surgeon: Peter M Martinique, MD;  Location: [facility] CATH LAB;  Service: Cardiovascular;  Laterality: N/A;   RIGHT/LEFT HEART CATH AND CORONARY ANGIOGRAPHY N/A 08/07/2021   Procedure: RIGHT/LEFT HEART CATH AND CORONARY ANGIOGRAPHY;  Surgeon: Jolaine Artist, MD;  Location: [facility] CV LAB;  Service: Cardiovascular;  Laterality: N/A;   UPPER GASTROINTESTINAL ENDOSCOPY     Family History  Problem Relation Age of Onset   Pneumonia Father    Heart attack Father    Colon polyps Father    Parkinsonism Mother    Diabetes Brother    Sarcoidosis Brother    Hypertension Sister    Diabetes Sister    Breast cancer Daughter    Colon cancer Neg Hx    Esophageal cancer Neg Hx    Rectal cancer Neg Hx    Stomach cancer Neg Hx    Social History   Socioeconomic History   Marital status: Divorced    Spouse name: Not on file   Number of children: 3   Years of education: Not on file   Highest education level: Not on file  Occupational History   Occupation: Retired    Fish farm manager: RETIRED  Tobacco Use   Smoking status: Former    Packs/day: 2.00    Years: 15.00    Total pack years: 30.00    Types: Cigarettes    Quit date: 04/30/1973    Years since quitting: 49.0   Smokeless tobacco: Never   Tobacco comments:    ex-smoker: smoked for 10-15 years up to 2ppd, quit 1975.  Vaping Use   Vaping Use: Never used  Substance and Sexual Activity   Alcohol use: Not Currently    Alcohol/week: 0.0 standard drinks of alcohol    Comment: none now    Drug use: No   Sexual activity: Not Currently  Other Topics Concern   Not on file  Social History Narrative   Right handed   Social Determinants of Health   Financial Resource Strain: Low Risk  (  05/24/2022)   Overall Financial Resource Strain (CARDIA)    Difficulty of Paying Living Expenses: Not hard at all  Food Insecurity: No Food Insecurity (05/24/2022)   Hunger Vital  Sign    Worried About Running Out of Food in the Last Year: Never true    Ran Out of Food in the Last Year: Never true  Transportation Needs: No Transportation Needs (05/24/2022)   PRAPARE - Hydrologist (Medical): No    Lack of Transportation (Non-Medical): No  Physical Activity: Inactive (05/24/2022)   Exercise Vital Sign    Days of Exercise per Week: 0 days    Minutes of Exercise per Session: 0 min  Stress: No Stress Concern Present (05/24/2022)   [place]    Feeling of Stress : Not at all  Social Connections: Moderately Integrated (05/24/2022)   Social Connection and Isolation Panel [NHANES]    Frequency of Communication with Friends and Family: More than three times a week    Frequency of Social Gatherings with Friends and Family: More than three times a week    Attends Religious Services: More than 4 times per year    Active Member of [employer] or Organizations: Yes    Attends Music therapist: More than 4 times per year    Marital Status: Divorced    Tobacco Counseling Counseling given: Not Answered Tobacco comments: ex-smoker: smoked for 10-15 years up to 2ppd, quit 1975.   Clinical Intake:  Pre-visit preparation completed: No  Pain : No/denies pain     BMI - recorded: 40.04 Nutritional Status: BMI > 30  Obese Nutritional Risks: None Diabetes: Yes CBG done?: No Did pt. bring in CBG monitor from home?: No Nutrition Risk Assessment:  Has the patient had any N/V/D within the last 2 months?  No  Does the patient have any non-healing wounds?  No  Has the patient had any unintentional weight loss or weight gain?  No   Diabetes:  Is the patient diabetic?  Yes  If diabetic, was a CBG obtained today?  No  Did the patient bring in their glucometer from home?  No  How often do you monitor your CBG's? 2 X Daily.   Financial Strains and Diabetes Management:  Are  you having any financial strains with the device, your supplies or your medication? No .  Does the patient want to be seen by Chronic Care Management for management of their diabetes?  No  Would the patient like to be referred to a Nutritionist or for Diabetic Management?  No   Diabetic Exams:  Diabetic Eye Exam: Completed No. Overdue for diabetic eye exam. Pt has been advised about the importance in completing this exam. A referral has been placed today. Message sent to referral coordinator for scheduling purposes. Advised pt to expect a call from office referred to regarding appt.  Diabetic Foot Exam: Completed No. Pt has been advised about the importance in completing this exam. Pt is scheduled for diabetic foot exam on Followed by PCP.   How often do you need to have someone help you when you read instructions, pamphlets, or other written materials from your doctor or pharmacy?: 3 - Sometimes (Son Assist)  Diabetic?  Yes  Interpreter Needed?: No  Information entered by :: Rolene Arbour LPN   Activities of Daily Living    05/24/2022    9:11 AM  In your present state of health, do you have  any difficulty performing the following activities:  Hearing? 0  Vision? 0  Difficulty concentrating or making decisions? 0  Walking or climbing stairs? 0  Dressing or bathing? 0  Doing errands, shopping? 0  Preparing Food and eating ? N  Using the Toilet? N  In the past six months, have you accidently leaked urine? Y  Comment Wears Depends Followed by Copy and Urologist  Do you have problems with loss of bowel control? Y  Comment Wears Depends, Followed by Gastrologist  Managing your Medications? N  Comment Son Assist  Managing your Finances? N  Housekeeping or managing your Housekeeping? N  Comment Son Assist    Patient Care Team: [name], MD as PCP - General (Family Medicine) Jerline Pain, MD as PCP - Cardiology (Cardiology) Pieter Partridge, DO as Consulting  Physician (Neurology) Viona Gilmore, [facility] (Inactive) as Pharmacist (Pharmacist)  Indicate any recent Medical Services you may have received from other than [facility] providers in the past year (date may be approximate).     Assessment:   This is a routine wellness examination for Ying.  Hearing/Vision screen Hearing Screening - Comments:: Denies hearing difficulties   Vision Screening - Comments:: Wears rx glasses - up to date with routine eye exams with  Dr Einar Gip  Dietary issues and exercise activities discussed: Exercise limited by: None identified   Goals Addressed               This Visit's Progress     Patient Stated (pt-stated)        I would like to walk better so I don't have to use my walker.       Depression Screen    05/24/2022    9:10 AM 08/21/2021    3:10 PM 06/16/2021   10:21 AM 05/22/2021    9:06 AM 05/11/2020    9:56 AM 02/06/2018    9:01 AM 05/25/2014    4:28 PM  PHQ 2/9 Scores  PHQ - 2 Score 0 0 1 0 0 0 0  PHQ- 9 Score   9  0      Fall Risk    05/24/2022    9:14 AM 08/21/2021    3:09 PM 06/16/2021   10:20 AM 05/22/2021    9:11 AM 05/11/2020    9:55 AM  Fall Risk   Falls in the past year? _0 0 0  Number falls in past yr: 0 1 1 0 0  Injury with Fall? 0 0 0 0 0  Risk for fall due to : No Fall Risks History of fall(s);Impaired balance/gait   Impaired balance/gait;Impaired mobility;Medication side effect  Follow up Falls prevention discussed Falls evaluation completed;Falls prevention discussed Falls evaluation completed  Falls evaluation completed;Falls prevention discussed    FALL RISK PREVENTION PERTAINING TO THE HOME:  Any stairs in or around the home? Yes  If so, are there any without handrails? No  Home free of loose throw rugs in walkways, pet beds, electrical cords, etc? Yes  Adequate lighting in your home to reduce risk of falls? Yes   ASSISTIVE DEVICES UTILIZED TO PREVENT FALLS:  Life alert? No  Use of a cane, walker or w/c? Yes   Grab bars in the bathroom? Yes  Shower chair or bench in shower? Yes  Elevated toilet seat or a handicapped toilet? No   TIMED UP AND GO:  Was the test performed? No . Audio Visit   Cognitive Function:  05/24/2022    9:15 AM 05/22/2021    9:12 AM 05/11/2020    9:59 AM  6CIT Screen  What Year? 0 points 0 points 0 points  What month? 0 points 0 points 0 points  What time? 0 points 0 points 0 points  Count back from 20 0 points 0 points 0 points  Months in reverse 0 points 0 points 2 points  Repeat phrase 0 points 0 points 4 points  Total Score 0 points 0 points 6 points    Immunizations Immunization History  Administered Date(s) Administered   Influenza, High Dose Seasonal PF 02/17/2019   PFIZER(Purple Top)SARS-COV-2 Vaccination 03/17/2020, 04/09/2020   Pneumococcal Conjugate-13 03/15/2015   Pneumococcal Polysaccharide-23 05/21/2006, 02/23/2015, 05/21/2016, 08/14/2018, 02/17/2019, 03/17/2020, 10/17/2020    TDAP status: Due, Education has been provided regarding the importance of this vaccine. Advised may receive this vaccine at local pharmacy or Health Dept. Aware to provide a copy of the vaccination record if obtained from local pharmacy or Health Dept. Verbalized acceptance and understanding.  Flu Vaccine status: Declined, Education has been provided regarding the importance of this vaccine but patient still declined. Advised may receive this vaccine at local pharmacy or Health Dept. Aware to provide a copy of the vaccination record if obtained from local pharmacy or Health Dept. Verbalized acceptance and understanding.  Pneumococcal vaccine status: Up to date  Covid-19 vaccine status: Completed vaccines  Qualifies for Shingles Vaccine? Yes   Zostavax completed No   Shingrix Completed?: No.    Education has been provided regarding the importance of this vaccine. Patient has been advised to call insurance company to determine out of pocket expense if they have not  yet received this vaccine. Advised may also receive vaccine at local pharmacy or Health Dept. Verbalized acceptance and understanding.  Screening Tests Health Maintenance  Topic Date Due   DTaP/Tdap/Td (1 - Tdap) Never done   HEMOGLOBIN A1C  12/14/2021   OPHTHALMOLOGY EXAM  05/24/2022 (Originally 08/19/2020)   Diabetic kidney evaluation - Urine ACR  05/25/2022 (Originally 07/23/1958)   FOOT EXAM  05/25/2022 (Originally 07/28/2019)   COVID-19 Vaccine (3 - Pfizer risk series) 06/09/2022 (Originally 05/07/2020)   Zoster Vaccines- Shingrix (1 of 2) 08/23/2022 (Originally 07/23/1959)   COLONOSCOPY (Pts 45-64yr Insurance coverage will need to be confirmed)  05/25/2023 (Originally 01/09/2021)   Hepatitis C Screening  05/25/2023 (Originally 07/23/1958)   Diabetic kidney evaluation - eGFR measurement  11/23/2022   Medicare Annual Wellness (AWV)  05/25/2023   Pneumonia Vaccine 80 Years old  Completed   DEXA SCAN  Completed   HPV VACCINES  Aged Out   INFLUENZA VACCINE  Discontinued    Health Maintenance  Health Maintenance Due  Topic Date Due   DTaP/Tdap/Td (1 - Tdap) Never done   HEMOGLOBIN A1C  12/14/2021    Colorectal cancer screening: Type of screening: Colonoscopy. Completed 01/09/18. Repeat every 3 years  Mammogram status: No longer required due to  .  Bone Density status: Completed 02/04/20. Results reflect: Bone density results: NORMAL. Repeat every   years.  Lung Cancer Screening: (Low Dose CT Chest recommended if Age 80-80years, 30 pack-year currently smoking OR have quit w/in 15years.) does not qualify.     Additional Screening:  Hepatitis C Screening: does qualify; Completed Deferred  Vision Screening: Recommended annual ophthalmology exams for early detection of glaucoma and other disorders of the eye. Is the patient up to date with their annual eye exam?  Yes  Who is the provider or what is  the name of the office in which the patient attends annual eye exams? Dr  Einar Gip If pt is not established with a provider, would they like to be referred to a provider to establish care? No .   Dental Screening: Recommended annual dental exams for proper oral hygiene  Community Resource Referral / Chronic Care Management:  CRR required this visit?  No   CCM required this visit?  No      Plan:     I have personally reviewed and noted the following in the patient's chart:   Medical and social history Use of alcohol, tobacco or illicit drugs  Current medications and supplements including opioid prescriptions. Patient is not currently taking opioid prescriptions. Functional ability and status Nutritional status Physical activity Advanced directives List of other physicians Hospitalizations, surgeries, and ER visits in previous 12 months Vitals Screenings to include cognitive, depression, and falls Referrals and appointments  In addition, I have reviewed and discussed with patient certain preventive protocols, quality metrics, and best practice recommendations. A written personalized care plan for preventive services as well as general preventive health recommendations were provided to patient.     Criselda Peaches, LPN   [id number]/92/9090   Nurse Notes: Patient due Hep-C Screening and Diabetic Kidney Evaluation- Urine ACR

## 2022-05-24 NOTE — Telephone Encounter (Signed)
Patient son is calling to get refill for mother's Lomotil. It needs to be sent to [facility] on [address].

## 2022-05-24 NOTE — Patient Instructions (Addendum)
[name] , Thank you for taking time to come for your Medicare Wellness Visit. I appreciate your ongoing commitment to your health goals. Please review the following plan we discussed and let me know if I can assist you in the future.   These are the goals we discussed:  Goals       Exercise 3x per week (30 min per time)      Patient Stated (pt-stated)      I would like to walk better so I don't have to use my walker.        This is a list of the screening recommended for you and due dates:  Health Maintenance  Topic Date Due   DTaP/Tdap/Td vaccine (1 - Tdap) Never done   Hemoglobin A1C  12/14/2021   Eye exam for diabetics  05/24/2022*   Yearly kidney health urinalysis for diabetes  05/25/2022*   Complete foot exam   05/25/2022*   COVID-19 Vaccine (3 - Pfizer risk series) 06/09/2022*   Zoster (Shingles) Vaccine (1 of 2) 08/23/2022*   Colon Cancer Screening  05/25/2023*   Hepatitis C Screening: USPSTF Recommendation to screen - Ages 18-79 yo.  05/25/2023*   Yearly kidney function blood test for diabetes  11/23/2022   Medicare Annual Wellness Visit  05/25/2023   Pneumonia Vaccine  Completed   DEXA scan (bone density measurement)  Completed   HPV Vaccine  Aged Out   Flu Shot  Discontinued  *Topic was postponed. The date shown is not the original due date.    Advanced directives: Advance directive discussed with you today. Even though you declined this today, please call our office should you change your mind, and we can give you the proper paperwork for you to fill out.   Conditions/risks identified: None  Next appointment: Follow up in one year for your annual wellness visit     Preventive Care 65 Years and Older, Female Preventive care refers to lifestyle choices and visits with your health care provider that can promote health and wellness. What does preventive care include? A yearly physical exam. This is also called an annual well check. Dental exams once or twice a  year. Routine eye exams. Ask your health care provider how often you should have your eyes checked. Personal lifestyle choices, including: Daily care of your teeth and gums. Regular physical activity. Eating a healthy diet. Avoiding tobacco and drug use. Limiting alcohol use. Practicing safe sex. Taking low-dose aspirin every day. Taking vitamin and mineral supplements as recommended by your health care provider. What happens during an annual well check? The services and screenings done by your health care provider during your annual well check will depend on your age, overall health, lifestyle risk factors, and family history of disease. Counseling  Your health care provider may ask you questions about your: Alcohol use. Tobacco use. Drug use. Emotional well-being. Home and relationship well-being. Sexual activity. Eating habits. History of falls. Memory and ability to understand (cognition). Work and work Statistician. Reproductive health. Screening  You may have the following tests or measurements: Height, weight, and BMI. Blood pressure. Lipid and cholesterol levels. These may be checked every 5 years, or more frequently if you are over 74 years old. Skin check. Lung cancer screening. You may have this screening every year starting at age 31 if you have a 30-pack-year history of smoking and currently smoke or have quit within the past 15 years. Fecal occult blood test (FOBT) of the stool. You may have  this test every year starting at age 46. Flexible sigmoidoscopy or colonoscopy. You may have a sigmoidoscopy every 5 years or a colonoscopy every 10 years starting at age 51. Hepatitis C blood test. Hepatitis B blood test. Sexually transmitted disease (STD) testing. Diabetes screening. This is done by checking your blood sugar (glucose) after you have not eaten for a while (fasting). You may have this done every 1-3 years. Bone density scan. This is done to screen for  osteoporosis. You may have this done starting at age 33. Mammogram. This may be done every 1-2 years. Talk to your health care provider about how often you should have regular mammograms. Talk with your health care provider about your test results, treatment options, and if necessary, the need for more tests. Vaccines  Your health care provider may recommend certain vaccines, such as: Influenza vaccine. This is recommended every year. Tetanus, diphtheria, and acellular pertussis (Tdap, Td) vaccine. You may need a Td booster every 10 years. Zoster vaccine. You may need this after age 49. Pneumococcal 13-valent conjugate (PCV13) vaccine. One dose is recommended after age 62. Pneumococcal polysaccharide (PPSV23) vaccine. One dose is recommended after age 69. Talk to your health care provider about which screenings and vaccines you need and how often you need them. This information is not intended to replace advice given to you by your health care provider. Make sure you discuss any questions you have with your health care provider. Document Released: 05/13/2015 Document Revised: 01/04/2016 Document Reviewed: 02/15/2015 Elsevier Interactive Patient Education  2017 [place] Prevention in the Home Falls can cause injuries. They can happen to people of all ages. There are many things you can do to make your home safe and to help prevent falls. What can I do on the outside of my home? Regularly fix the edges of walkways and driveways and fix any cracks. Remove anything that might make you trip as you walk through a door, such as a raised step or threshold. Trim any bushes or trees on the path to your home. Use bright outdoor lighting. Clear any walking paths of anything that might make someone trip, such as rocks or tools. Regularly check to see if handrails are loose or broken. Make sure that both sides of any steps have handrails. Any raised decks and porches should have guardrails on  the edges. Have any leaves, snow, or ice cleared regularly. Use sand or salt on walking paths during winter. Clean up any spills in your garage right away. This includes oil or grease spills. What can I do in the bathroom? Use night lights. Install grab bars by the toilet and in the tub and shower. Do not use towel bars as grab bars. Use non-skid mats or decals in the tub or shower. If you need to sit down in the shower, use a plastic, non-slip stool. Keep the floor dry. Clean up any water that spills on the floor as soon as it happens. Remove soap buildup in the tub or shower regularly. Attach bath mats securely with double-sided non-slip rug tape. Do not have throw rugs and other things on the floor that can make you trip. What can I do in the bedroom? Use night lights. Make sure that you have a light by your bed that is easy to reach. Do not use any sheets or blankets that are too big for your bed. They should not hang down onto the floor. Have a firm chair that has side arms. You  can use this for support while you get dressed. Do not have throw rugs and other things on the floor that can make you trip. What can I do in the kitchen? Clean up any spills right away. Avoid walking on wet floors. Keep items that you use a lot in easy-to-reach places. If you need to reach something above you, use a strong step stool that has a grab bar. Keep electrical cords out of the way. Do not use floor polish or wax that makes floors slippery. If you must use wax, use non-skid floor wax. Do not have throw rugs and other things on the floor that can make you trip. What can I do with my stairs? Do not leave any items on the stairs. Make sure that there are handrails on both sides of the stairs and use them. Fix handrails that are broken or loose. Make sure that handrails are as long as the stairways. Check any carpeting to make sure that it is firmly attached to the stairs. Fix any carpet that is loose  or worn. Avoid having throw rugs at the top or bottom of the stairs. If you do have throw rugs, attach them to the floor with carpet tape. Make sure that you have a light switch at the top of the stairs and the bottom of the stairs. If you do not have them, ask someone to add them for you. What else can I do to help prevent falls? Wear shoes that: Do not have high heels. Have rubber bottoms. Are comfortable and fit you well. Are closed at the toe. Do not wear sandals. If you use a stepladder: Make sure that it is fully opened. Do not climb a closed stepladder. Make sure that both sides of the stepladder are locked into place. Ask someone to hold it for you, if possible. Clearly mark and make sure that you can see: Any grab bars or handrails. First and last steps. Where the edge of each step is. Use tools that help you move around (mobility aids) if they are needed. These include: Canes. Walkers. Scooters. Crutches. Turn on the lights when you go into a dark area. Replace any light bulbs as soon as they burn out. Set up your furniture so you have a clear path. Avoid moving your furniture around. If any of your floors are uneven, fix them. If there are any pets around you, be aware of where they are. Review your medicines with your doctor. Some medicines can make you feel dizzy. This can increase your chance of falling. Ask your doctor what other things that you can do to help prevent falls. This information is not intended to replace advice given to you by your health care provider. Make sure you discuss any questions you have with your health care provider. Document Released: 02/10/2009 Document Revised: 09/22/2015 Document Reviewed: 05/21/2014 Elsevier Interactive Patient Education  2017 [employer].

## 2022-05-24 NOTE — Telephone Encounter (Signed)
Prescription sent to patient's pharmacy.

## 2022-05-29 DIAGNOSIS — M25512 Pain in left shoulder: Secondary | ICD-10-CM | POA: Diagnosis not present

## 2022-05-30 ENCOUNTER — Other Ambulatory Visit: Payer: Self-pay | Admitting: Family Medicine

## 2022-05-30 ENCOUNTER — Other Ambulatory Visit: Payer: Self-pay | Admitting: Cardiology

## 2022-05-30 DIAGNOSIS — E119 Type 2 diabetes mellitus without complications: Secondary | ICD-10-CM

## 2022-05-31 ENCOUNTER — Ambulatory Visit (INDEPENDENT_AMBULATORY_CARE_PROVIDER_SITE_OTHER): Payer: PPO | Admitting: Adult Health

## 2022-05-31 ENCOUNTER — Encounter: Payer: Self-pay | Admitting: Adult Health

## 2022-05-31 VITALS — BP 118/80 | HR 77 | Temp 98.8°F | Ht 60.0 in | Wt 213.6 lb

## 2022-05-31 DIAGNOSIS — J4540 Moderate persistent asthma, uncomplicated: Secondary | ICD-10-CM | POA: Diagnosis not present

## 2022-05-31 DIAGNOSIS — J9611 Chronic respiratory failure with hypoxia: Secondary | ICD-10-CM

## 2022-05-31 DIAGNOSIS — G4733 Obstructive sleep apnea (adult) (pediatric): Secondary | ICD-10-CM

## 2022-05-31 NOTE — Assessment & Plan Note (Signed)
Chronic hypoxic and hypercarbic respiratory failure.  Continue on oxygen to maintain O2 saturation greater than 88 to 90%

## 2022-05-31 NOTE — Progress Notes (Signed)
$'@Patient'J$  ID: [name], female    DOB: May 27, 1940, 82 y.o.   MRN: [id number]  Chief Complaint  Patient presents with   Follow-up    Referring provider: [name], MD  HPI: 82 year old female seen for sleep consult December 26, 2021 for suspected sleep apnea and OHS.  Patient has chronic hypoxic and hypercarbic respiratory failure on nocturnal trilogy noninvasive vent and oxygen with activity and at bedtime, and Asthma  Hospital patient April 2023 with altered mental status and acute hypercarbic and hypoxic respiratory failure requiring intubation.  Found to have significant pulmonary hypertension, discharged on trilogy noninvasive vent and home oxygen Medical history significant for congestive heart failure, coronary disease and diabetes  TEST/EVENTS :  Hospital 07/2021  Found unresponsive by family in April 2023, EMS called and intubated in ED. Hypotensive and bradycardiac. Started on NE, IVF and atropine.  She was treated for RML PNA. Admitted to ICU.  Concern for sepsis +/- CGS, PNA in setting of underlying PAH/cor pulmonale and untreated OSA. POCUS -> Echo LVEF 55%, RV moderately reduced. R/LHC mild to moderate non-obstructive CAD with 90% lesion in apical LAD, normal LV function and moderate PAH with preserved output.  Pressors weaned off and patient extubated. Discharged home on 3L oxygen, weight 209 lbs. Discharged home with Trilogy machine.  Ltd Echo (4/23): EF 35-40%, LV moderately down with global LV HK, moderate LVH moderate to severe TR   - R/LHC (4/23):Predominantly mild to moderate non-obstructive CAD with 90% lesion in apical LAD 2. Normal LV function 3. Moderate PAH     Dist LAD lesion is 90% stenosed.   Prox RCA lesion is 30% stenosed.   Dist RCA lesion is 30% stenosed.   Prox LAD to Mid LAD lesion is 30% stenosed.   The left ventricular ejection fraction is 55-65% by visual estimate.   --[employer] Verdis Frederickson who stated TRILOGY auth is good until April 2024    April 26, 2022 echo showed improvement with EF at 50 to 86%, grade 1 diastolic dysfunction, mildly elevated pulmonary artery systolic pressure.   Hospital 07/2021  Found unresponsive by family in April 2023, EMS called and intubated in ED. Hypotensive and bradycardiac. Started on NE, IVF and atropine.  She was treated for RML PNA. Admitted to ICU.  Concern for sepsis +/- CGS, PNA in setting of underlying PAH/cor pulmonale and untreated OSA. POCUS -> Echo LVEF 55%, RV moderately reduced. R/LHC mild to moderate non-obstructive CAD with 90% lesion in apical LAD, normal LV function and moderate PAH with preserved output.  Pressors weaned off and patient extubated. Discharged home on 3L oxygen, weight 209 lbs. Discharged home with Trilogy  machine.  05/31/2022 Follow up : OSA/OHS, chronic hypoxic and hypercarbic respiratory failure  Patient returns for a 21-monthfollow-up.  Since last visit patient says she is doing okay.  She continues on her trilogy noninvasive vent at bedtime.  Says she wears it every night.  Gets in about 8 hours.  Patient says it is made a huge difference for her.  She says her machine did break in her DME company had to change it out.  Says it is working good now. She declines a flu shot. She does have asthma and allergies.  She is followed by the allergy center.  She remains on Symbicort twice daily.  She denies any increased cough or wheezing.  No increased albuterol use.  Patient remains on oxygen 2 L with activity.   Allergies  Allergen Reactions   Doxycycline Other (  See Comments)    Unsteady gait   Aspirin Nausea And Vomiting   Azithromycin Other (See Comments)    REACTION: pt states "ZPak doesn't work"   Caffeine Nausea And Vomiting   Ciprofloxacin Nausea And Vomiting    Tolerates levaquin   Codeine Nausea And Vomiting   Oxycodone Other (See Comments)    Pt stated, "upsets my stomach" (11/19/17)   Sulfamethizole Other (See Comments)    Unknown reaction   Tramadol  Hcl Other (See Comments)    REACTION: pt states "spaced-out"    Immunization History  Administered Date(s) Administered   Influenza, High Dose Seasonal PF 02/17/2019   PFIZER(Purple Top)SARS-COV-2 Vaccination 03/17/2020, 04/09/2020   Pneumococcal Conjugate-13 03/15/2015   Pneumococcal Polysaccharide-23 05/21/2006, 02/23/2015, 05/21/2016, 08/14/2018, 02/17/2019, 03/17/2020, 10/17/2020    Past Medical History:  Diagnosis Date   Adenomatous colon polyp 02/2006   Allergy    Allergy, unspecified not elsewhere classified    Anemia    Anxiety    Arthritis    Asthma    Blood transfusion without reported diagnosis    Bronchiectasis    CAD (coronary artery disease)    BMS to the LAD, 2002; cath 12/07/11 patent LAD stent and mild nonobstructive disease, EF 65%; Medical management   Cataract    removed both eyes   CHF (congestive heart failure) (HCC)    Diastolic   Chronic diastolic CHF (congestive heart failure) ([place]) 11/25/2011   Clotting disorder ([place])    in legs per pt    Diabetes mellitus    Diverticulosis of colon    DJD (degenerative joint disease)    Fibromyalgia    GERD (gastroesophageal reflux disease)    H. pylori infection 05/12/2019   Hypercholesterolemia    Hypertension    Microscopic hematuria    Neoplasm of kidney    Obesity    Other diseases of lung, not elsewhere classified    Pinched vertebral nerve    Pulmonary nodule    Negative PET in 2010   Stress incontinence, female    Syncope    Ulcerative colitis, left sided ([place]) 2008   Hx of   UTI (lower urinary tract infection)    Vitamin D deficiency disease     Tobacco History: Social History   Tobacco Use  Smoking Status Former   Packs/day: 2.00   Years: 15.00   Total pack years: 30.00   Types: Cigarettes   Quit date: 04/30/1973   Years since quitting: 49.1  Smokeless Tobacco Never  Tobacco Comments   ex-smoker: smoked for 10-15 years up to 2ppd, quit 1975.   Counseling given: Not Answered Tobacco  comments: ex-smoker: smoked for 10-15 years up to 2ppd, quit 1975.   Outpatient Medications Prior to Visit  Medication Sig Dispense Refill   acetaminophen (TYLENOL) 500 MG tablet Take 500 mg by mouth every 6 (six) hours as needed for headache (pain).      albuterol (PROVENTIL) (2.5 MG/3ML) 0.083% nebulizer solution Take 3 mLs (2.5 mg total) by nebulization every 4 (four) hours as needed for wheezing or shortness of breath. 75 mL 3   albuterol (VENTOLIN HFA) 108 (90 BASE) MCG/ACT inhaler Inhale 2 puffs into the lungs every 4 (four) hours as needed for wheezing or shortness of breath. 18 each 11   ALPRAZolam (XANAX) 0.5 MG tablet Take 1 tablet (0.5 mg total) by mouth 3 (three) times daily as needed. 90 tablet 5   amLODipine (NORVASC) 10 MG tablet TAKE 1 TABLET BY MOUTH EVERY DAY 90 tablet  3   atorvastatin (LIPITOR) 20 MG tablet TAKE 1 TABLET (20 MG TOTAL) BY MOUTH DAILY. PLEASE CALL [phone] TO SCHEDULE AN APPOINTMENT FOR FUTURE REFILLS. THANK YOU. 1ST ATTEMPT. 90 tablet 1   azelastine (ASTELIN) 0.1 % nasal spray Place 1 spray into both nostrils at bedtime.     Blood Glucose Monitoring Suppl (ONETOUCH VERIO FLEX SYSTEM) w/Device KIT 1 each by Does not apply route daily at 12 noon. 1 kit 0   Cholecalciferol (VITAMIN D-3) 5000 UNITS TABS Patient takes 1 tablet by mouth once a week on Mondays.     clopidogrel (PLAVIX) 75 MG tablet TAKE 1 TABLET BY MOUTH EVERY DAY 90 tablet 0   diphenoxylate-atropine (LOMOTIL) 2.5-0.025 MG tablet TAKE 1 TABLET BY MOUTH 2 TIMES DAILY AS NEEDED 60 tablet 0   fluticasone (FLONASE) 50 MCG/ACT nasal spray PLACE 2 SPRAYS IN EACH NOSTRIL AT BEDTIME 16 g 6   gabapentin (NEURONTIN) 300 MG capsule TAKE 1 CAPSULE BY MOUTH TWICE A DAY 60 capsule 5   glimepiride (AMARYL) 1 MG tablet TAKE 2 TABLETS (2 MG TOTAL) BY MOUTH DAILY WITH BREAKFAST 180 tablet 1   hydrOXYzine (VISTARIL) 25 MG capsule TAKE 1 CAPSULE (25 MG TOTAL) BY MOUTH EVERY 6 (SIX) HOURS AS NEEDED. 360 capsule 0    hyoscyamine (LEVBID) 0.375 MG 12 hr tablet Take 1 tablet (0.375 mg total) by mouth 2 (two) times daily. 60 tablet 5   levocetirizine (XYZAL) 5 MG tablet Take 5 mg by mouth every evening.     LIDODERM 5 % APPLY 1 PATCH TO SKIN FOR 12 HOURS THEN REMOVE AND DISCARD PATCH WITHIN 72 HOURS OR AS DIRECTED 30 patch 2   loperamide (IMODIUM) 2 MG capsule Take 4-6 mg by mouth daily.     losartan (COZAAR) 25 MG tablet Take 1 tablet (25 mg total) by mouth daily. 30 tablet 0   magnesium oxide (MAG-OX) 400 MG tablet Take 3 tablets (1,200 mg total) by mouth daily. 90 tablet 5   mesalamine (LIALDA) 1.2 g EC tablet TAKE 2 TABLETS BY MOUTH 2 TIMES DAILY. 360 tablet 1   metFORMIN (GLUCOPHAGE-XR) 750 MG 24 hr tablet Take 1 tablet (750 mg total) by mouth daily with breakfast. 90 tablet 3   metoprolol succinate (TOPROL XL) 100 MG 24 hr tablet Take 1 tablet (100 mg total) by mouth daily. Take with or immediately following a meal. 30 tablet 11   montelukast (SINGULAIR) 10 MG tablet Take one every morning 90 tablet 3   neomycin-polymyxin-hydrocortisone (CORTISPORIN) OTIC solution Place 4 drops into both ears 4 (four) times daily. 10 mL 2   nitroGLYCERIN (NITROSTAT) 0.4 MG SL tablet PLACE 1 TABLET (0.4 MG TOTAL) UNDER THE TONGUE EVERY 5 (FIVE) MINUTES AS NEEDED FOR CHEST PAIN. 75 tablet 1   Olopatadine HCl 0.7 % SOLN Place 1 drop into both eyes daily as needed (itching/allergies).     ondansetron (ZOFRAN-ODT) 4 MG disintegrating tablet TAKE 1 TABLET BY MOUTH EVERY 8 HOURS AS NEEDED FOR NAUSEA AND VOMITING 30 tablet 1   OneTouch Delica Lancets 09N MISC 1 each by Does not apply route daily at 12 noon. 100 each 0   ONETOUCH VERIO test strip USE TO CHECK BLOOD GLUCOSE ONCE DAILY 100 strip 1   Pancrelipase, Lip-Prot-Amyl, (ZENPEP) 40000-126000 units CPEP Take by mouth. Take 2 tablets by mouth before meals and 1 tablet before snacks     pantoprazole (PROTONIX) 40 MG tablet TAKE 1 TABLET BY MOUTH TWICE A DAY 180 tablet 0  Polyethyl Glycol-Propyl Glycol (SYSTANE OP) Place 1 drop into both eyes daily. For dry eyes     potassium chloride SA (KLOR-CON M) 20 MEQ tablet Take 1 tablet (20 mEq total) by mouth 2 (two) times daily. 60 tablet 11   tiZANidine (ZANAFLEX) 4 MG tablet TAKE 1 TABLET BY MOUTH TWICE A DAY 180 tablet 3   TOBRADEX ophthalmic solution Place 1 drop into both eyes 4 (four) times daily. As needed     torsemide (DEMADEX) 20 MG tablet TAKE 2 TABLETS DAILY 180 tablet 3   sitaGLIPtin (JANUVIA) 100 MG tablet Take 1 tablet (100 mg total) by mouth daily. 90 tablet 3   Facility-Administered Medications Prior to Visit  Medication Dose Route Frequency Provider Last Rate Last Admin   ipratropium-albuterol (DUONEB) 0.5-2.5 (3) MG/3ML nebulizer solution 3 mL  3 mL Nebulization Once Nafziger, Cory, NP         Review of Systems:   Constitutional:   No  weight loss, night sweats,  Fevers, chills, + fatigue, or  lassitude.  HEENT:   No headaches,  Difficulty swallowing,  Tooth/dental problems, or  Sore throat,                No sneezing, itching, ear ache, nasal congestion, post nasal drip,   CV:  No chest pain,  Orthopnea, PND,  anasarca, dizziness, palpitations, syncope.   GI  No heartburn, indigestion, abdominal pain, nausea, vomiting, diarrhea, change in bowel habits, loss of appetite, bloody stools.   Resp: No shortness of breath with exertion or at rest.  No excess mucus, no productive cough,  No non-productive cough,  No coughing up of blood.  No change in color of mucus.  No wheezing.  No chest wall deformity  Skin: no rash or lesions.  GU: no dysuria, change in color of urine, no urgency or frequency.  No flank pain, no hematuria   MS:  No joint pain or swelling.  No decreased range of motion.  No back pain.    Physical Exam  BP 118/80 (BP Location: Right Arm, Patient Position: Sitting, Cuff Size: Large)   Pulse 77   Temp 98.8 F (37.1 C) (Oral)   Ht 5' (1.524 m)   Wt 213 lb 9.6 oz (96.9  kg)   LMP  (LMP Unknown)   SpO2 94%   BMI 41.72 kg/m   GEN: A/Ox3; pleasant , NAD, well nourished    HEENT:  [place]/AT,  NOSE-clear, THROAT-clear, no lesions, no postnasal drip or exudate noted.   NECK:  Supple w/ fair ROM; no JVD; normal carotid impulses w/o bruits; no thyromegaly or nodules palpated; no lymphadenopathy.    RESP  Clear  P & A; w/o, wheezes/ rales/ or rhonchi. no accessory muscle use, no dullness to percussion  CARD:  RRR, no m/r/g, no peripheral edema, pulses intact, no cyanosis or clubbing.  GI:   Soft & nt; nml bowel sounds; no organomegaly or masses detected.   Musco: Warm bil, no deformities or joint swelling noted.   Neuro: alert, no focal deficits noted.    Skin: Warm, no lesions or rashes    Lab Results:  CBC       Imaging: No results found.       Latest Ref Rng & Units 08/10/2021    2:52 PM  PFT Results  FVC-Pre L 1.14   FVC-Predicted Pre % 65   Pre FEV1/FVC % % 77   FEV1-Pre L 0.87   FEV1-Predicted Pre % 65  No results found for: "NITRICOXIDE"      Assessment & Plan:   Chronic respiratory failure with hypoxia (HCC) Chronic hypoxic and hypercarbic respiratory failure.  Continue on oxygen to maintain O2 saturation greater than 88 to 90%  Asthma Appears controlled on present regimen.  OSA (obstructive sleep apnea) Suspected sleep apnea and obesity hypoventilation syndrome.  Previous episode with altered mental status April 2023 with acute hypoxic and hypercarbic respiratory failure requiring intubation.  Patient has been maintained on noninvasive ventilation at bedtime with significant improvement.  Patient has excellent compliance.  Continue on current regimen.  Consider sleep study going forward if indicated     Rexene Edison, NP 05/31/2022

## 2022-05-31 NOTE — Patient Instructions (Addendum)
Continue on TRILOGY At bedtime with Oxygen 2l/m , wear at bedtime and with naps.  Activity as tolerated.  Continue on Symbicort 2 puffs Twice daily. Rinse after use.  Albuterol inhaler or neb As needed   Continue on Oxygen 2l/m with activity to keep sats >88-90%.  Follow up with Dr. Ander Slade  in 4  months and As needed   Please contact office for sooner follow up if symptoms do not improve or worsen or seek emergency care

## 2022-05-31 NOTE — Assessment & Plan Note (Signed)
Suspected sleep apnea and obesity hypoventilation syndrome.  Previous episode with altered mental status April 2023 with acute hypoxic and hypercarbic respiratory failure requiring intubation.  Patient has been maintained on noninvasive ventilation at bedtime with significant improvement.  Patient has excellent compliance.  Continue on current regimen.  Consider sleep study going forward if indicated

## 2022-05-31 NOTE — Assessment & Plan Note (Signed)
Appears controlled on present regimen.

## 2022-06-03 DIAGNOSIS — J984 Other disorders of lung: Secondary | ICD-10-CM | POA: Diagnosis not present

## 2022-06-12 ENCOUNTER — Other Ambulatory Visit: Payer: Self-pay | Admitting: Family Medicine

## 2022-06-17 DIAGNOSIS — J189 Pneumonia, unspecified organism: Secondary | ICD-10-CM | POA: Diagnosis not present

## 2022-06-17 DIAGNOSIS — J9601 Acute respiratory failure with hypoxia: Secondary | ICD-10-CM | POA: Diagnosis not present

## 2022-06-17 DIAGNOSIS — I2609 Other pulmonary embolism with acute cor pulmonale: Secondary | ICD-10-CM | POA: Diagnosis not present

## 2022-06-19 ENCOUNTER — Ambulatory Visit
Admission: RE | Admit: 2022-06-19 | Discharge: 2022-06-19 | Disposition: A | Discharge status: Home / Self Care | Payer: PPO | Source: Ambulatory Visit | Attending: Urology | Admitting: Urology

## 2022-06-19 DIAGNOSIS — N281 Cyst of kidney, acquired: Secondary | ICD-10-CM

## 2022-06-19 MED ORDER — GADOPICLENOL 0.5 MMOL/ML IV SOLN
10.0000 mL | Freq: Once | INTRAVENOUS | Status: AC | PRN
  Administered 2022-06-19: 10 mL via INTRAVENOUS

## 2022-06-24 ENCOUNTER — Other Ambulatory Visit: Payer: Self-pay | Admitting: Gastroenterology

## 2022-06-24 ENCOUNTER — Other Ambulatory Visit: Payer: Self-pay | Admitting: Family Medicine

## 2022-06-28 ENCOUNTER — Telehealth: Payer: Self-pay | Admitting: Gastroenterology

## 2022-06-28 MED ORDER — DIPHENOXYLATE-ATROPINE 2.5-0.025 MG PO TABS: ORAL_TABLET | ORAL | 1 refills | Status: DC

## 2022-06-28 NOTE — Telephone Encounter (Signed)
Prescription faxed to patient's pharmacy. 

## 2022-06-28 NOTE — Telephone Encounter (Signed)
Patient son is calling to get refill for mother's Lomotil. [facility] [address]     Please advise.

## 2022-06-29 ENCOUNTER — Other Ambulatory Visit: Payer: Self-pay

## 2022-06-29 ENCOUNTER — Other Ambulatory Visit: Payer: PPO

## 2022-06-29 DIAGNOSIS — G8929 Other chronic pain: Secondary | ICD-10-CM | POA: Diagnosis not present

## 2022-06-29 DIAGNOSIS — F1190 Opioid use, unspecified, uncomplicated: Secondary | ICD-10-CM

## 2022-07-01 LAB — DRUG MONITORING, PANEL 8 WITH CONFIRMATION, URINE
6 Acetylmorphine: NEGATIVE ng/mL (ref <10)
Alcohol Metabolites: NEGATIVE ng/mL (ref <500)
Amphetamines: NEGATIVE ng/mL (ref <500)
Benzodiazepines: NEGATIVE ng/mL (ref <100)
Buprenorphine, Urine: NEGATIVE ng/mL (ref <5)
Cocaine Metabolite: NEGATIVE ng/mL (ref <150)
Codeine: NEGATIVE ng/mL (ref <50)
Creatinine: 67.3 mg/dL (ref >=20.0)
Hydrocodone: NEGATIVE ng/mL (ref <50)
Hydromorphone: 62 ng/mL — ABNORMAL HIGH (ref <50)
MDMA: NEGATIVE ng/mL (ref <500)
Marijuana Metabolite: NEGATIVE ng/mL (ref <20)
Morphine: NEGATIVE ng/mL (ref <50)
Norhydrocodone: 162 ng/mL — ABNORMAL HIGH (ref <50)
Opiates: POSITIVE ng/mL — AB (ref <100)
Oxidant: NEGATIVE ug/mL (ref <200)
Oxycodone: NEGATIVE ng/mL (ref <100)
pH: 7.4 (ref 4.5–9.0)

## 2022-07-01 LAB — DM TEMPLATE

## 2022-07-02 DIAGNOSIS — J984 Other disorders of lung: Secondary | ICD-10-CM | POA: Diagnosis not present

## 2022-07-03 ENCOUNTER — Encounter: Payer: Self-pay | Admitting: Family Medicine

## 2022-07-03 ENCOUNTER — Telehealth (INDEPENDENT_AMBULATORY_CARE_PROVIDER_SITE_OTHER): Payer: PPO | Admitting: Family Medicine

## 2022-07-03 DIAGNOSIS — G8929 Other chronic pain: Secondary | ICD-10-CM

## 2022-07-03 DIAGNOSIS — F1190 Opioid use, unspecified, uncomplicated: Secondary | ICD-10-CM

## 2022-07-03 DIAGNOSIS — M5442 Lumbago with sciatica, left side: Secondary | ICD-10-CM

## 2022-07-03 DIAGNOSIS — M5441 Lumbago with sciatica, right side: Secondary | ICD-10-CM

## 2022-07-03 MED ORDER — HYDROCODONE-ACETAMINOPHEN 10-325 MG PO TABS: 1.0000 | ORAL_TABLET | Freq: Four times a day (QID) | ORAL | 0 refills | Status: AC | PRN

## 2022-07-03 MED ORDER — HYDROCODONE-ACETAMINOPHEN 10-325 MG PO TABS: 1.0000 | ORAL_TABLET | Freq: Four times a day (QID) | ORAL | 0 refills | Status: DC | PRN

## 2022-07-03 NOTE — Progress Notes (Signed)
Subjective:    Patient ID: [name], female    DOB: 10-30-1940, 82 y.o.   MRN: [id number]  HPI Virtual Visit via Video Note  I connected with the patient on 07/03/22 at 10:15 AM EST by a video enabled telemedicine application and verified that I am speaking with the correct person using two identifiers.  Location patient: home Location provider:work or home office Persons participating in the virtual visit: patient, provider  I discussed the limitations of evaluation and management by telemedicine and the availability of in person appointments. The patient expressed understanding and agreed to proceed.   HPI: Here for pain management. She is doing well although she has a lot of back pain if she is on her feet for long periods of time.    ROS: See pertinent positives and negatives per HPI.  Past Medical History:  Diagnosis Date   Adenomatous colon polyp 02/2006   Allergy    Allergy, unspecified not elsewhere classified    Anemia    Anxiety    Arthritis    Asthma    Blood transfusion without reported diagnosis    Bronchiectasis    CAD (coronary artery disease)    BMS to the LAD, 2002; cath 12/07/11 patent LAD stent and mild nonobstructive disease, EF 65%; Medical management   Cataract    removed both eyes   CHF (congestive heart failure) (HCC)    Diastolic   Chronic diastolic CHF (congestive heart failure) ([place]) 11/25/2011   Clotting disorder ([place])    in legs per pt    Diabetes mellitus    Diverticulosis of colon    DJD (degenerative joint disease)    Fibromyalgia    GERD (gastroesophageal reflux disease)    H. pylori infection 05/12/2019   Hypercholesterolemia    Hypertension    Microscopic hematuria    Neoplasm of kidney    Obesity    Other diseases of lung, not elsewhere classified    Pinched vertebral nerve    Pulmonary nodule    Negative PET in 2010   Stress incontinence, female    Syncope    Ulcerative colitis, left sided ([place]) 2008   Hx of   UTI  (lower urinary tract infection)    Vitamin D deficiency disease     Past Surgical History:  Procedure Laterality Date   ABDOMINAL HYSTERECTOMY     CARDIAC CATHETERIZATION  11/2011   CATARACT EXTRACTION, BILATERAL  2020   CHOLECYSTECTOMY     COLONOSCOPY  01/09/2018   per Dr. Fuller Plan, adenomatous polyps, repeat in 3 yrs   CORONARY STENT PLACEMENT  2002   BMS to the LAD   ESOPHAGOGASTRODUODENOSCOPY  12/2008   HAND SURGERY  01/2006   Right Hand Surgery by Dr. Amedeo Plenty   LEFT HEART CATHETERIZATION WITH CORONARY ANGIOGRAM N/A 12/07/2011   Procedure: LEFT HEART CATHETERIZATION WITH CORONARY ANGIOGRAM;  Surgeon: Peter M Martinique, MD;  Location: [facility] CATH LAB;  Service: Cardiovascular;  Laterality: N/A;   RIGHT/LEFT HEART CATH AND CORONARY ANGIOGRAPHY N/A 08/07/2021   Procedure: RIGHT/LEFT HEART CATH AND CORONARY ANGIOGRAPHY;  Surgeon: Jolaine Artist, MD;  Location: [facility] CV LAB;  Service: Cardiovascular;  Laterality: N/A;   UPPER GASTROINTESTINAL ENDOSCOPY      Family History  Problem Relation Age of Onset   Pneumonia Father    Heart attack Father    Colon polyps Father    Parkinsonism Mother    Diabetes Brother    Sarcoidosis Brother    Hypertension Sister  Diabetes Sister    Breast cancer Daughter    Colon cancer Neg Hx    Esophageal cancer Neg Hx    Rectal cancer Neg Hx    Stomach cancer Neg Hx      Current Outpatient Medications:    acetaminophen (TYLENOL) 500 MG tablet, Take 500 mg by mouth every 6 (six) hours as needed for headache (pain). , Disp: , Rfl:    albuterol (PROVENTIL) (2.5 MG/3ML) 0.083% nebulizer solution, Take 3 mLs (2.5 mg total) by nebulization every 4 (four) hours as needed for wheezing or shortness of breath., Disp: 75 mL, Rfl: 3   albuterol (VENTOLIN HFA) 108 (90 BASE) MCG/ACT inhaler, Inhale 2 puffs into the lungs every 4 (four) hours as needed for wheezing or shortness of breath., Disp: 18 each, Rfl: 11   ALPRAZolam (XANAX) 0.5 MG tablet, Take 1  tablet (0.5 mg total) by mouth 3 (three) times daily as needed., Disp: 90 tablet, Rfl: 5   amLODipine (NORVASC) 10 MG tablet, TAKE 1 TABLET BY MOUTH EVERY DAY, Disp: 90 tablet, Rfl: 3   atorvastatin (LIPITOR) 20 MG tablet, TAKE 1 TABLET (20 MG TOTAL) BY MOUTH DAILY. PLEASE CALL [phone] TO SCHEDULE AN APPOINTMENT FOR FUTURE REFILLS. THANK YOU. 1ST ATTEMPT., Disp: 90 tablet, Rfl: 1   azelastine (ASTELIN) 0.1 % nasal spray, Place 1 spray into both nostrils at bedtime., Disp: , Rfl:    Blood Glucose Monitoring Suppl (ONETOUCH VERIO FLEX SYSTEM) w/Device KIT, 1 each by Does not apply route daily at 12 noon., Disp: 1 kit, Rfl: 0   Cholecalciferol (VITAMIN D-3) 5000 UNITS TABS, Patient takes 1 tablet by mouth once a week on Mondays., Disp: , Rfl:    clopidogrel (PLAVIX) 75 MG tablet, TAKE 1 TABLET BY MOUTH EVERY DAY, Disp: 90 tablet, Rfl: 0   diphenoxylate-atropine (LOMOTIL) 2.5-0.025 MG tablet, TAKE 1 TABLET BY MOUTH 2 TIMES DAILY AS NEEDED, Disp: 60 tablet, Rfl: 1   fluticasone (FLONASE) 50 MCG/ACT nasal spray, PLACE 2 SPRAYS IN EACH NOSTRIL AT BEDTIME, Disp: 16 g, Rfl: 6   gabapentin (NEURONTIN) 300 MG capsule, TAKE 1 CAPSULE BY MOUTH TWICE A DAY, Disp: 60 capsule, Rfl: 5   glimepiride (AMARYL) 1 MG tablet, TAKE 2 TABLETS (2 MG TOTAL) BY MOUTH DAILY WITH BREAKFAST, Disp: 180 tablet, Rfl: 1   hydrOXYzine (VISTARIL) 25 MG capsule, TAKE 1 CAPSULE (25 MG TOTAL) BY MOUTH EVERY 6 (SIX) HOURS AS NEEDED., Disp: 360 capsule, Rfl: 0   hyoscyamine (LEVBID) 0.375 MG 12 hr tablet, TAKE 1 TABLET (0.375 MG TOTAL) BY MOUTH 2 (TWO) TIMES DAILY., Disp: 180 tablet, Rfl: 0   JANUVIA 100 MG tablet, TAKE 1 TABLET BY MOUTH EVERY DAY, Disp: 90 tablet, Rfl: 3   levocetirizine (XYZAL) 5 MG tablet, Take 5 mg by mouth every evening., Disp: , Rfl:    LIDODERM 5 %, APPLY 1 PATCH TO SKIN FOR 12 HOURS THEN REMOVE AND DISCARD PATCH WITHIN 72 HOURS OR AS DIRECTED, Disp: 30 patch, Rfl: 2   loperamide (IMODIUM) 2 MG capsule, Take 4-6  mg by mouth daily., Disp: , Rfl:    losartan (COZAAR) 25 MG tablet, Take 1 tablet (25 mg total) by mouth daily., Disp: 30 tablet, Rfl: 0   magnesium oxide (MAG-OX) 400 MG tablet, Take 3 tablets (1,200 mg total) by mouth daily., Disp: 90 tablet, Rfl: 5   mesalamine (LIALDA) 1.2 g EC tablet, TAKE 2 TABLETS BY MOUTH 2 TIMES DAILY., Disp: 360 tablet, Rfl: 1   metFORMIN (GLUCOPHAGE-XR) 750 MG  24 hr tablet, Take 1 tablet (750 mg total) by mouth daily with breakfast., Disp: 90 tablet, Rfl: 3   metoprolol succinate (TOPROL XL) 100 MG 24 hr tablet, Take 1 tablet (100 mg total) by mouth daily. Take with or immediately following a meal., Disp: 30 tablet, Rfl: 11   montelukast (SINGULAIR) 10 MG tablet, Take one every morning, Disp: 90 tablet, Rfl: 3   neomycin-polymyxin-hydrocortisone (CORTISPORIN) OTIC solution, Place 4 drops into both ears 4 (four) times daily., Disp: 10 mL, Rfl: 2   nitroGLYCERIN (NITROSTAT) 0.4 MG SL tablet, PLACE 1 TABLET (0.4 MG TOTAL) UNDER THE TONGUE EVERY 5 (FIVE) MINUTES AS NEEDED FOR CHEST PAIN., Disp: 75 tablet, Rfl: 1   Olopatadine HCl 0.7 % SOLN, Place 1 drop into both eyes daily as needed (itching/allergies)., Disp: , Rfl:    ondansetron (ZOFRAN-ODT) 4 MG disintegrating tablet, TAKE 1 TABLET BY MOUTH EVERY 8 HOURS AS NEEDED FOR NAUSEA AND VOMITING, Disp: 30 tablet, Rfl: 1   OneTouch Delica Lancets 99991111 MISC, 1 each by Does not apply route daily at 12 noon., Disp: 100 each, Rfl: 0   ONETOUCH VERIO test strip, USE TO CHECK BLOOD GLUCOSE ONCE DAILY, Disp: 100 strip, Rfl: 1   Pancrelipase, Lip-Prot-Amyl, (ZENPEP) 40000-126000 units CPEP, Take by mouth. Take 2 tablets by mouth before meals and 1 tablet before snacks, Disp: , Rfl:    pantoprazole (PROTONIX) 40 MG tablet, TAKE 1 TABLET BY MOUTH TWICE A DAY, Disp: 180 tablet, Rfl: 0   Polyethyl Glycol-Propyl Glycol (SYSTANE OP), Place 1 drop into both eyes daily. For dry eyes, Disp: , Rfl:    potassium chloride SA (KLOR-CON M) 20 MEQ  tablet, Take 1 tablet (20 mEq total) by mouth 2 (two) times daily., Disp: 60 tablet, Rfl: 11   tiZANidine (ZANAFLEX) 4 MG tablet, TAKE 1 TABLET BY MOUTH TWICE A DAY, Disp: 180 tablet, Rfl: 3   TOBRADEX ophthalmic solution, Place 1 drop into both eyes 4 (four) times daily. As needed, Disp: , Rfl:    torsemide (DEMADEX) 20 MG tablet, TAKE 2 TABLETS DAILY, Disp: 180 tablet, Rfl: 3  Current Facility-Administered Medications:    ipratropium-albuterol (DUONEB) 0.5-2.5 (3) MG/3ML nebulizer solution 3 mL, 3 mL, Nebulization, Once, Nafziger, Tommi Rumps, NP  EXAM:  VITALS per patient if applicable:  GENERAL: alert, oriented, appears well and in no acute distress  HEENT: atraumatic, conjunttiva clear, no obvious abnormalities on inspection of external nose and ears  NECK: normal movements of the head and neck  LUNGS: on inspection no signs of respiratory distress, breathing rate appears normal, no obvious gross SOB, gasping or wheezing  CV: no obvious cyanosis  MS: moves all visible extremities without noticeable abnormality  PSYCH/NEURO: pleasant and cooperative, no obvious depression or anxiety, speech and thought processing grossly intact  ASSESSMENT AND PLAN: Pain management. Indication for chronic opioid: low back pain Medication and dose: Norco 10-325 # pills per month: 120 Last UDS date: 06-29-22 Opioid Treatment Agreement signed (Y/N): 10-28-18 Opioid Treatment Agreement last reviewed with patient:  07-03-22 [facility] reviewed this encounter (include red flags): Yes Meds were refilled.  Alysia Penna, MD  Discussed the following assessment and plan:  No diagnosis found.     I discussed the assessment and treatment plan with the patient. The patient was provided an opportunity to ask questions and all were answered. The patient agreed with the plan and demonstrated an understanding of the instructions.   The patient was advised to call back or seek an in-person evaluation if  the symptoms  worsen or if the condition fails to improve as anticipated.      Review of Systems     Objective:   Physical Exam        Assessment & Plan:

## 2022-07-12 ENCOUNTER — Other Ambulatory Visit: Payer: Self-pay | Admitting: Family Medicine

## 2022-07-16 DIAGNOSIS — J189 Pneumonia, unspecified organism: Secondary | ICD-10-CM | POA: Diagnosis not present

## 2022-07-16 DIAGNOSIS — J9601 Acute respiratory failure with hypoxia: Secondary | ICD-10-CM | POA: Diagnosis not present

## 2022-07-16 DIAGNOSIS — I2609 Other pulmonary embolism with acute cor pulmonale: Secondary | ICD-10-CM | POA: Diagnosis not present

## 2022-07-26 ENCOUNTER — Other Ambulatory Visit: Payer: Self-pay | Admitting: Cardiology

## 2022-07-26 ENCOUNTER — Other Ambulatory Visit: Payer: Self-pay | Admitting: Gastroenterology

## 2022-07-26 MED ORDER — LOSARTAN POTASSIUM 25 MG PO TABS: 25.0000 mg | ORAL_TABLET | Freq: Every day | ORAL | 0 refills | Status: DC

## 2022-08-02 DIAGNOSIS — J984 Other disorders of lung: Secondary | ICD-10-CM | POA: Diagnosis not present

## 2022-08-06 ENCOUNTER — Telehealth: Payer: Self-pay

## 2022-08-06 NOTE — Progress Notes (Signed)
Patient ID: [name], female   DOB: [birth year], 82 y.o.   MRN: [id number]  Care Management & Coordination Services Pharmacy Team  Reason for Encounter: General adherence update   Contacted patient for general health update and medication adherence call.  Spoke with caregiver Verlin Dike on 08/06/2022    What concerns do you have about your medications? Son reports none  The patient denies side effects with their medications.   How often do you forget or accidentally miss a dose? Never  Do you use a pillbox? Yes  Are you having any problems getting your medications from your pharmacy? No  Has the cost of your medications been a concern? No  The patient has not had an ED visit since last contact.   The patient denies problems with their health.   Patient denies concerns or questions for Milas Kocher, PharmD at this time.   Counseled patient on: Benefits of adherence packaging or a pillbox and Access to carecoordination team for any cost, medication or pharmacy concerns. Spoke with son whom advised he is the primary contact for her as she may not understand what is being asked of her, he was in agreement for pharmacist call appointment with him in June.   Chart Updates:  Recent office visits:  07/03/22 Nelwyn Salisbury, MD - Patient presented for Chronic narcotic use and other concerns Prescribed Hydrocodone- Acetaminophen.  05/24/22 Tillie Rung, LPN - Patient presented for Medicare Annual Wellness exam. No medication changes.   Recent consult visits:  05/31/22 Parrett, Virgel Bouquet, NP (Pulmonology) - Patient presented for Chronic respiratory failure with hypoxia and other concerns. No medication changes.   02/22/22 Parrett, Virgel Bouquet, NP (Pulmonology) - Patient presented for Chronic respiratory failure with hypoxia and other concerns. No medication changes  Hospital visits:  None in previous 6 months  Medications: Outpatient Encounter Medications as of 08/06/2022  Medication Sig    acetaminophen (TYLENOL) 500 MG tablet Take 500 mg by mouth every 6 (six) hours as needed for headache (pain).    albuterol (PROVENTIL) (2.5 MG/3ML) 0.083% nebulizer solution Take 3 mLs (2.5 mg total) by nebulization every 4 (four) hours as needed for wheezing or shortness of breath.   albuterol (VENTOLIN HFA) 108 (90 BASE) MCG/ACT inhaler Inhale 2 puffs into the lungs every 4 (four) hours as needed for wheezing or shortness of breath.   ALPRAZolam (XANAX) 0.5 MG tablet Take 1 tablet (0.5 mg total) by mouth 3 (three) times daily as needed.   amLODipine (NORVASC) 10 MG tablet TAKE 1 TABLET BY MOUTH EVERY DAY   atorvastatin (LIPITOR) 20 MG tablet TAKE 1 TABLET (20 MG TOTAL) BY MOUTH DAILY. PLEASE CALL [phone] TO SCHEDULE AN APPOINTMENT FOR FUTURE REFILLS. THANK YOU. 1ST ATTEMPT.   azelastine (ASTELIN) 0.1 % nasal spray Place 1 spray into both nostrils at bedtime.   Blood Glucose Monitoring Suppl (ONETOUCH VERIO FLEX SYSTEM) w/Device KIT 1 each by Does not apply route daily at 12 noon.   Cholecalciferol (VITAMIN D-3) 5000 UNITS TABS Patient takes 1 tablet by mouth once a week on Mondays.   clopidogrel (PLAVIX) 75 MG tablet TAKE 1 TABLET BY MOUTH EVERY DAY   diphenoxylate-atropine (LOMOTIL) 2.5-0.025 MG tablet TAKE 1 TABLET BY MOUTH 2 TIMES DAILY AS NEEDED   fluticasone (FLONASE) 50 MCG/ACT nasal spray PLACE 2 SPRAYS IN EACH NOSTRIL AT BEDTIME   gabapentin (NEURONTIN) 300 MG capsule TAKE 1 CAPSULE BY MOUTH TWICE A DAY   glimepiride (AMARYL) 1 MG tablet TAKE 2 TABLETS (  2 MG TOTAL) BY MOUTH DAILY WITH BREAKFAST   [START ON 09/02/2022] HYDROcodone-acetaminophen (NORCO) 10-325 MG tablet Take 1 tablet by mouth every 6 (six) hours as needed for moderate pain.   hydrOXYzine (VISTARIL) 25 MG capsule TAKE 1 CAPSULE (25 MG TOTAL) BY MOUTH EVERY 6 (SIX) HOURS AS NEEDED.   hyoscyamine (LEVBID) 0.375 MG 12 hr tablet TAKE 1 TABLET (0.375 MG TOTAL) BY MOUTH 2 (TWO) TIMES DAILY.   JANUVIA 100 MG tablet TAKE 1 TABLET  BY MOUTH EVERY DAY   levocetirizine (XYZAL) 5 MG tablet Take 5 mg by mouth every evening.   LIDODERM 5 % APPLY 1 PATCH TO SKIN FOR 12 HOURS THEN REMOVE AND DISCARD PATCH WITHIN 72 HOURS OR AS DIRECTED   loperamide (IMODIUM) 2 MG capsule Take 4-6 mg by mouth daily.   losartan (COZAAR) 25 MG tablet Take 1 tablet (25 mg total) by mouth daily.   magnesium oxide (MAG-OX) 400 MG tablet Take 3 tablets (1,200 mg total) by mouth daily.   mesalamine (LIALDA) 1.2 g EC tablet Take 2 tablets (2.4 g total) by mouth 2 (two) times daily. NEEDS OFFICE VISIT FOR FURTHER REFILLS   metFORMIN (GLUCOPHAGE-XR) 750 MG 24 hr tablet Take 1 tablet (750 mg total) by mouth daily with breakfast.   metoprolol succinate (TOPROL XL) 100 MG 24 hr tablet Take 1 tablet (100 mg total) by mouth daily. Take with or immediately following a meal.   montelukast (SINGULAIR) 10 MG tablet Take one every morning   neomycin-polymyxin-hydrocortisone (CORTISPORIN) OTIC solution Place 4 drops into both ears 4 (four) times daily.   nitroGLYCERIN (NITROSTAT) 0.4 MG SL tablet PLACE 1 TABLET (0.4 MG TOTAL) UNDER THE TONGUE EVERY 5 (FIVE) MINUTES AS NEEDED FOR CHEST PAIN.   Olopatadine HCl 0.7 % SOLN Place 1 drop into both eyes daily as needed (itching/allergies).   ondansetron (ZOFRAN-ODT) 4 MG disintegrating tablet TAKE 1 TABLET BY MOUTH EVERY 8 HOURS AS NEEDED FOR NAUSEA AND VOMITING   OneTouch Delica Lancets 33G MISC 1 each by Does not apply route daily at 12 noon.   ONETOUCH VERIO test strip USE TO CHECK BLOOD GLUCOSE ONCE DAILY   Pancrelipase, Lip-Prot-Amyl, (ZENPEP) 40000-126000 units CPEP Take by mouth. Take 2 tablets by mouth before meals and 1 tablet before snacks   pantoprazole (PROTONIX) 40 MG tablet Take 1 tablet (40 mg total) by mouth 2 (two) times daily. NEEDS OFFICE VISIT FOR FURTHER REFILLS   Polyethyl Glycol-Propyl Glycol (SYSTANE OP) Place 1 drop into both eyes daily. For dry eyes   potassium chloride SA (KLOR-CON M) 20 MEQ tablet  Take 1 tablet (20 mEq total) by mouth 2 (two) times daily.   tiZANidine (ZANAFLEX) 4 MG tablet TAKE 1 TABLET BY MOUTH TWICE A DAY   TOBRADEX ophthalmic solution Place 1 drop into both eyes 4 (four) times daily. As needed   torsemide (DEMADEX) 20 MG tablet TAKE 2 TABLETS DAILY   Facility-Administered Encounter Medications as of 08/06/2022  Medication   ipratropium-albuterol (DUONEB) 0.5-2.5 (3) MG/3ML nebulizer solution 3 mL    Recent vitals BP Readings from Last 3 Encounters:  05/31/22 118/80  02/22/22 108/70  12/26/21 128/78   Pulse Readings from Last 3 Encounters:  05/31/22 77  02/22/22 95  12/26/21 71   Wt Readings from Last 3 Encounters:  05/31/22 213 lb 9.6 oz (96.9 kg)  05/24/22 205 lb (93 kg)  02/22/22 208 lb 12.8 oz (94.7 kg)   BMI Readings from Last 3 Encounters:  05/31/22 41.72 kg/m  05/24/22 40.04 kg/m  02/22/22 40.78 kg/m    Recent lab results    Component Value Date/Time   NA 137 11/22/2021 1130   NA 142 09/08/2019 1134   K 4.0 11/22/2021 1130   CL 102 11/22/2021 1130   CO2 27 11/22/2021 1130   GLUCOSE 133 (H) 11/22/2021 1130   BUN 17 11/22/2021 1130   BUN 16 09/08/2019 1134   CREATININE 0.85 11/22/2021 1130   CALCIUM 9.4 11/22/2021 1130    Lab Results  Component Value Date   CREATININE 0.85 11/22/2021   GFR 83.44 06/16/2021   GFRNONAA >60 11/22/2021   GFRAA 102 09/08/2019   Lab Results  Component Value Date/Time   HGBA1C 7.8 (H) 06/16/2021 11:26 AM   HGBA1C 7.7 (H) 07/18/2020 11:43 AM    Lab Results  Component Value Date   CHOL 146 06/16/2021   HDL 58.60 06/16/2021   LDLCALC 66 06/16/2021   TRIG 104.0 06/16/2021   CHOLHDL 2 06/16/2021    Care Gaps: Diabetic kidney urine - Overdue TDAP - Overdue Foot Exam - Overdue COVID Booster - Overdue Eye Exam - Overdue A1C - Overdue Zoster Vaccine - Postponed Colonoscopy - Overdue AWV - 05/24/22  Star Rating Drugs:  Atorvastatin 20 mg - [facility] Glimepiride 1 mg -  Last filled [facility]/13/22 [facility] Januvia 100 mg - Last filled 05/31/22 [facility] Losartan 25 mg - Last filled [facility]/26/22 [facility] Metformin 750 mg - Last [facility]   Pamala Duffel CMA Clinical Pharmacist Assistant [phone]

## 2022-08-07 ENCOUNTER — Telehealth: Payer: Self-pay | Admitting: Family Medicine

## 2022-08-07 NOTE — Telephone Encounter (Signed)
Prescription Request  08/07/2022  LOV: 08/21/2021  What is the name of the medication or equipment? metoprolol succinate (TOPROL XL) 100 MG 24 hr tablet  Have you contacted your pharmacy to request a refill? No   Which pharmacy would you like this sent to?  [id number] [place], [place] - [address] [place] [address] Phone: [phone] Fax: [phone]    Patient notified that their request is being sent to the clinical staff for review and that they should receive a response within 2 business days.   Please advise at Mobile [phone] (mobile)

## 2022-08-08 MED ORDER — METOPROLOL SUCCINATE ER 100 MG PO TB24: 100.0000 mg | ORAL_TABLET | Freq: Every day | ORAL | 1 refills | Status: DC

## 2022-08-08 NOTE — Addendum Note (Signed)
Addended byConrad [place] D on: 08/08/2022 11:16 AM   Modules accepted: Orders

## 2022-08-08 NOTE — Telephone Encounter (Signed)
Rx sent 

## 2022-08-16 DIAGNOSIS — I2609 Other pulmonary embolism with acute cor pulmonale: Secondary | ICD-10-CM | POA: Diagnosis not present

## 2022-08-16 DIAGNOSIS — J189 Pneumonia, unspecified organism: Secondary | ICD-10-CM | POA: Diagnosis not present

## 2022-08-16 DIAGNOSIS — J9601 Acute respiratory failure with hypoxia: Secondary | ICD-10-CM | POA: Diagnosis not present

## 2022-08-18 ENCOUNTER — Other Ambulatory Visit: Payer: Self-pay | Admitting: Cardiology

## 2022-08-20 ENCOUNTER — Other Ambulatory Visit: Payer: Self-pay | Admitting: Cardiology

## 2022-08-27 ENCOUNTER — Encounter: Payer: Self-pay | Admitting: Gastroenterology

## 2022-08-27 ENCOUNTER — Telehealth: Payer: Self-pay | Admitting: Gastroenterology

## 2022-08-27 MED ORDER — DIPHENOXYLATE-ATROPINE 2.5-0.025 MG PO TABS: ORAL_TABLET | ORAL | 1 refills | Status: DC

## 2022-08-27 NOTE — Telephone Encounter (Signed)
Inbound call from patient son needing medication refill for Lomotil. Please advise.

## 2022-08-27 NOTE — Telephone Encounter (Signed)
Prescription sent to patient's pharmacy.

## 2022-08-28 ENCOUNTER — Other Ambulatory Visit: Payer: Self-pay | Admitting: Family Medicine

## 2022-08-29 ENCOUNTER — Other Ambulatory Visit: Payer: Self-pay | Admitting: Gastroenterology

## 2022-08-31 ENCOUNTER — Other Ambulatory Visit: Payer: Self-pay | Admitting: Cardiology

## 2022-09-01 DIAGNOSIS — J984 Other disorders of lung: Secondary | ICD-10-CM | POA: Diagnosis not present

## 2022-09-04 ENCOUNTER — Other Ambulatory Visit (INDEPENDENT_AMBULATORY_CARE_PROVIDER_SITE_OTHER): Payer: PPO

## 2022-09-04 ENCOUNTER — Ambulatory Visit: Payer: PPO | Admitting: Gastroenterology

## 2022-09-04 ENCOUNTER — Encounter: Payer: Self-pay | Admitting: Gastroenterology

## 2022-09-04 VITALS — BP 98/62 | HR 68 | Ht 60.0 in | Wt 194.0 lb

## 2022-09-04 DIAGNOSIS — R197 Diarrhea, unspecified: Secondary | ICD-10-CM | POA: Diagnosis not present

## 2022-09-04 DIAGNOSIS — K580 Irritable bowel syndrome with diarrhea: Secondary | ICD-10-CM

## 2022-09-04 DIAGNOSIS — K5150 Left sided colitis without complications: Secondary | ICD-10-CM

## 2022-09-04 LAB — C-REACTIVE PROTEIN: CRP: <1 mg/dL (ref 0.5–20.0)

## 2022-09-04 LAB — COMPREHENSIVE METABOLIC PANEL
ALT: 5 U/L (ref 0–35)
AST: 10 U/L (ref 0–37)
Albumin: 4 g/dL (ref 3.5–5.2)
Alkaline Phosphatase: 79 U/L (ref 39–117)
BUN: 15 mg/dL (ref 6–23)
CO2: 28 mEq/L (ref 19–32)
Calcium: 9.3 mg/dL (ref 8.4–10.5)
Chloride: 99 mEq/L (ref 96–112)
Creatinine, Ser: 0.8 mg/dL (ref 0.40–1.20)
GFR: 69.74 mL/min (ref >60.00)
Glucose, Bld: 153 mg/dL — ABNORMAL HIGH (ref 70–99)
Potassium: 3.9 mEq/L (ref 3.5–5.1)
Sodium: 136 mEq/L (ref 135–145)
Total Bilirubin: 0.4 mg/dL (ref 0.2–1.2)
Total Protein: 7.4 g/dL (ref 6.0–8.3)

## 2022-09-04 LAB — CBC WITH DIFFERENTIAL/PLATELET
Basophils Absolute: 0 10*3/uL (ref 0.0–0.1)
Basophils Relative: 0.4 % (ref 0.0–3.0)
Eosinophils Absolute: 0.7 10*3/uL (ref 0.0–0.7)
Eosinophils Relative: 6.5 % — ABNORMAL HIGH (ref 0.0–5.0)
HCT: 35.4 % — ABNORMAL LOW (ref 36.0–46.0)
Hemoglobin: 11.8 g/dL — ABNORMAL LOW (ref 12.0–15.0)
Lymphocytes Relative: 12.2 % (ref 12.0–46.0)
Lymphs Abs: 1.4 10*3/uL (ref 0.7–4.0)
MCHC: 33.4 g/dL (ref 30.0–36.0)
MCV: 90.7 fl (ref 78.0–100.0)
Monocytes Absolute: 0.7 10*3/uL (ref 0.1–1.0)
Monocytes Relative: 6.7 % (ref 3.0–12.0)
Neutro Abs: 8.3 10*3/uL — ABNORMAL HIGH (ref 1.4–7.7)
Neutrophils Relative %: 74.2 % (ref 43.0–77.0)
Platelets: 532 10*3/uL — ABNORMAL HIGH (ref 150.0–400.0)
RBC: 3.91 Mil/uL (ref 3.87–5.11)
RDW: 14.3 % (ref 11.5–15.5)
WBC: 11.2 10*3/uL — ABNORMAL HIGH (ref 4.0–10.5)

## 2022-09-04 LAB — TSH: TSH: 1.41 u[IU]/mL (ref 0.35–5.50)

## 2022-09-04 LAB — SEDIMENTATION RATE: Sed Rate: 54 mm/hr — ABNORMAL HIGH (ref 0–30)

## 2022-09-04 NOTE — Progress Notes (Signed)
    Assessment     Worsening diarrhea - R/O infectious causes, ulcerative colitis flare, IBS flare Left sided UC Pancreatic insufficiency  GERD IBS-D Personal history of tubular adenomas and sessile serrated polyps   Recommendations    Blood work, stool studies for infection and fecal calprotectin Continue Lialda 2.4 g p.o. twice daily Continue Zenpep 40,000 take 2 p.o. before meals 1 p.o. before snacks Continue pantoprazole 40 mg twice daily and follow standard antireflux measures Continue Levbid 0.375 mg p.o. twice daily Continue Imodium 3 times daily as needed or Lomotil twice daily as needed Avoid foods and beverages that exacerbate GI symptoms Given age and comorbidities she is no longer under UC or polyp surveillance  REV in 1 month   HPI    [name] with worsening diarrhea.  She is accompanied by her daughter.  She relates of multiple loose bowel movements daily and bowel movements at night for the past 2 weeks.  She notes no recent medication changes or antibiotic use.  She has mild abdominal pain that is relieved with bowel movements.  She feels Levbid has not been as successful in controlling her symptoms as glycopyrrolate which was no longer covered by her insurance.  She notes a small amount of rectal bleeding occasionally when wiping.  She denies fevers chills.    Labs / Imaging       Latest Ref Rng & Units 08/10/2021    3:07 AM 08/04/2021    1:45 AM 08/03/2021   11:59 AM  Hepatic Function  Total Protein 6.5 - 8.1 g/dL  5.6  6.8   Albumin 3.5 - 5.0 g/dL 3.3  3.0  3.7   AST 15 - 41 U/L  108  239   ALT 0 - 44 U/L  49  60   Alk Phosphatase 38 - 126 U/L  92  106   Total Bilirubin 0.3 - 1.2 mg/dL  0.7  0.8        Latest Ref Rng & Units 08/16/2021    2:00 PM 08/11/2021    2:34 AM 08/10/2021    3:07 AM  CBC  WBC 4.0 - 10.5 K/uL 13.6  12.7  13.1   Hemoglobin 12.0 - 15.0 g/dL 16.1  09.6  04.5   Hematocrit 36.0 - 46.0 % 42.9  39.5  39.7    Platelets 150 - 400 K/uL 577  413  481     Current Medications, Allergies, Past Medical History, Past Surgical History, Family History and Social History were reviewed in [employer] record.   Physical Exam: General: Well developed, well nourished, no acute distress Head: Normocephalic and atraumatic Eyes: Sclerae anicteric, EOMI Ears: Normal auditory acuity Mouth: No deformities or lesions noted Lungs: Clear throughout to auscultation Heart: Regular rate and rhythm; No murmurs, rubs or bruits Abdomen: Soft, non tender and non distended. No masses, hepatosplenomegaly or hernias noted. Normal Bowel sounds Rectal: Not done Musculoskeletal: Symmetrical with no gross deformities  Pulses:  Normal pulses noted Extremities: No edema or deformities noted Neurological: Alert oriented x 4, grossly nonfocal Psychological:  Alert and cooperative. Normal mood and affect   Mathayus Stanbery T. Russella Dar, MD 09/04/2022, 9:59 AM

## 2022-09-04 NOTE — Patient Instructions (Signed)
Your provider has requested that you go to the basement level for lab work before leaving today. Press "B" on the elevator. The lab is located at the first door on the left as you exit the elevator.  Your provider has ordered "[employer]" stool testing for you. You have received a kit from our office today containing all necessary supplies to complete this test. Please carefully read the stool collection instructions provided in the kit before opening the accompanying materials. In addition, be sure to place the label from the top left corner of the laboratory request sheet onto the "puritan opti-swab" tube that is supplied in the kit. This label should include your full name and date of birth. After completing the test, you should secure the purtian tube into the specimen biohazard bag. The laboratory request information sheet (including date and time of specimen collection) should be placed into the outside pocket of the specimen biohazard bag and returned to the [facility] lab with 2 days of collection.   The [facility] providers would like to encourage you to use [facility] to communicate with providers for non-urgent requests or questions.  Due to long hold times on the telephone, sending your provider a message by [facility] may be a faster and more efficient way to get a response.  Please allow 48 business hours for a response.  Please remember that this is for non-urgent requests.   Due to recent changes in healthcare laws, you may see the results of your imaging and laboratory studies on MyChart before your provider has had a chance to review them.  We understand that in some cases there may be results that are confusing or concerning to you. Not all laboratory results come back in the same time frame and the provider may be waiting for multiple results in order to interpret others.  Please give [place] 48 hours in order for your provider to thoroughly review all the results before contacting the office for  clarification of your results.   Thank you for choosing me and [facility].  Venita Lick. Pleas Koch., MD., Clementeen Graham

## 2022-09-05 ENCOUNTER — Other Ambulatory Visit: Payer: PPO

## 2022-09-05 DIAGNOSIS — R197 Diarrhea, unspecified: Secondary | ICD-10-CM | POA: Diagnosis not present

## 2022-09-05 DIAGNOSIS — K5150 Left sided colitis without complications: Secondary | ICD-10-CM

## 2022-09-05 DIAGNOSIS — K580 Irritable bowel syndrome with diarrhea: Secondary | ICD-10-CM | POA: Diagnosis not present

## 2022-09-05 LAB — TISSUE TRANSGLUTAMINASE, IGA: (tTG) Ab, IgA: <1 U/mL

## 2022-09-05 LAB — IGA: Immunoglobulin A: 227 mg/dL (ref 70–320)

## 2022-09-06 ENCOUNTER — Telehealth: Payer: Self-pay | Admitting: Gastroenterology

## 2022-09-06 NOTE — Telephone Encounter (Signed)
Good morning, patient is returning your call. Please advise

## 2022-09-06 NOTE — Telephone Encounter (Signed)
Inbound call from patient, returning call in regards to lab results.

## 2022-09-06 NOTE — Telephone Encounter (Signed)
See result note.  

## 2022-09-13 LAB — CALPROTECTIN, FECAL: Calprotectin, Fecal: 943 ug/g — ABNORMAL HIGH (ref 0–120)

## 2022-09-15 DIAGNOSIS — I2609 Other pulmonary embolism with acute cor pulmonale: Secondary | ICD-10-CM | POA: Diagnosis not present

## 2022-09-15 DIAGNOSIS — J189 Pneumonia, unspecified organism: Secondary | ICD-10-CM | POA: Diagnosis not present

## 2022-09-15 DIAGNOSIS — J9601 Acute respiratory failure with hypoxia: Secondary | ICD-10-CM | POA: Diagnosis not present

## 2022-09-16 ENCOUNTER — Other Ambulatory Visit: Payer: Self-pay | Admitting: Cardiology

## 2022-09-16 ENCOUNTER — Other Ambulatory Visit: Payer: Self-pay | Admitting: Family Medicine

## 2022-09-17 ENCOUNTER — Other Ambulatory Visit: Payer: Self-pay | Admitting: *Deleted

## 2022-09-17 MED ORDER — LOSARTAN POTASSIUM 25 MG PO TABS: 25.0000 mg | ORAL_TABLET | Freq: Every day | ORAL | 0 refills | Status: DC

## 2022-09-19 ENCOUNTER — Telehealth: Payer: Self-pay

## 2022-09-19 NOTE — Telephone Encounter (Signed)
Entocort 9 mg qd x 3 months  Contact us if symptoms are not significantly improved of the next 2-3 weeks

## 2022-09-19 NOTE — Telephone Encounter (Signed)
Dr. Russella Dar has reviewed [facility] GI panel with C. Diff and the results are negative. Left message for patient to return my call.

## 2022-09-19 NOTE — Telephone Encounter (Signed)
Left message for patient to return my call.

## 2022-09-19 NOTE — Telephone Encounter (Signed)
Informed patient of stool study results. Patient still complains of occasional rectal bleeding and mucus with a BM. After reviewing the result note from previous labs, Dr. Russella Dar do you want patient to start Entocort?

## 2022-09-20 NOTE — Telephone Encounter (Signed)
Left message for patient to return my call.

## 2022-09-21 MED ORDER — BUDESONIDE 3 MG PO CPEP: 9.0000 mg | ORAL_CAPSULE | Freq: Every day | ORAL | 2 refills | Status: DC

## 2022-09-21 NOTE — Telephone Encounter (Signed)
Left message for patient to return my call.

## 2022-09-21 NOTE — Telephone Encounter (Signed)
Informed patient and patient's son that I sent a prescription to the pharmacy to treat the inflammation. Also, to contact our office if her symptoms are not better in 2-3 weeks. They both verbalized understanding.

## 2022-09-22 ENCOUNTER — Other Ambulatory Visit: Payer: Self-pay | Admitting: Gastroenterology

## 2022-09-23 NOTE — Progress Notes (Signed)
Cardiology Office Note:    Date:  09/25/2022   ID:  [name], DOB Apr 01, 1941, MRN [id number]  PCP:  Nelwyn Salisbury, MD   [facility] Providers Cardiologist:  Donato Schultz, MD     Referring MD: Nelwyn Salisbury, MD   Chief Complaint: chronic HFpEF and pulmonary hypertension  History of Present Illness:    [name] is a 82 y.o. female with a hx of chronic HFpEF, CAD, HTN, hyperlipidemia, diabetes, chronic hypoxic respiratory failure, OSA, pulmonary hypertension, ulcerative colitis, and morbid obesity.   History of CAD with bare-metal stent 3.5 x 20 mm express placed in the proximal LAD with 3 subsequent catheterizations showing patent stent.  She has been intolerant to aspirin. Previously followed by Dr. Myrtis Ser, now followed by Dr. Anne Fu.   Found unresponsive by family April 2023, EMS called and intubated in ED.  Hypotensive and bradycardic.  Started on NAC, IVF, and atropine.  She was treated for RML PNA, admitted to ICU.  Concern for sepsis +/- CGS, PNA in setting of underlying PAH/cor pulmonale and untreated OSA.  Echo LVEF 35-40%, RV moderately reduced.  R/LHC mild to moderate nonobstructive CAD with 90% lesion in apical LAD, normal LV function and moderate PAH with preserved output. Recommended medical therapy  Pressors weaned off and patient extubated.  Discharged home on 3L oxygen, weight 209 lbs.,  Discharged with trilogy BiPAP machine.  Seen by Dr. Gala Romney on 11/22/2021 for 32-month follow-up.  Accompanied by her son who assists with history.  She ambulates with a walker.  Her son monitors her O2 sats regularly and has her wear O2 as needed for sats < 90%.  Home weight stable between 204 to 207 lb.  Wearing BiPAP at night.  Felt to be euvolemic on exam. Declined starting SGLT2i, did not want to make any medication changes. PAH likely group 3. Advised to return in 2-3 months with echo prior.  Echo 04/26/2022 revealed LVEF 50 to 55%, G1 DD, mildly elevated PASP, normal RV  function, right ventricular systolic pressure 39.5 mmHg, moderately dilated LA, mildly dilated RA, mild MR, mild to moderate TR, mild dilatation of the ascending aorta measuring 41 mm, frequent PACs throughout examination.  Today, she is here with her daughter. She was not aware that she missed an appointment with Dr. Gala Romney but knew that she had gotten the echo 03/2022. Lives with her son and his wife, does not do any house work.  Goes shopping on occasion. Reports occasional chest pain and palpitations. These are not frequent and are not concerning to her. No recent weight gain at home to her awareness. Does not weigh daily. Has shortness of breath that she feels is chronic. No orthopnea or PND.  She answers questions yes and no but does not provide additional details. Most pressing concern is chronic stomach problems including diarrhea.    Past Medical History:  Diagnosis Date   Adenomatous colon polyp 02/2006   Allergy    Allergy, unspecified not elsewhere classified    Anemia    Anxiety    Arthritis    Asthma    Blood transfusion without reported diagnosis    Bronchiectasis    CAD (coronary artery disease)    BMS to the LAD, 2002; cath 12/07/11 patent LAD stent and mild nonobstructive disease, EF 65%; Medical management   Cataract    removed both eyes   CHF (congestive heart failure) (HCC)    Diastolic   Chronic diastolic CHF (congestive heart failure) (HCC) 11/25/2011  Clotting disorder (HCC)    in legs per pt    Diabetes mellitus    Diverticulosis of colon    DJD (degenerative joint disease)    Fibromyalgia    GERD (gastroesophageal reflux disease)    H. pylori infection 05/12/2019   Hypercholesterolemia    Hypertension    Microscopic hematuria    Neoplasm of kidney    Obesity    Other diseases of lung, not elsewhere classified    Pinched vertebral nerve    Pulmonary nodule    Negative PET in 2010   Stress incontinence, female    Syncope    Ulcerative colitis, left  sided (HCC) 2008   Hx of   UTI (lower urinary tract infection)    Vitamin D deficiency disease     Past Surgical History:  Procedure Laterality Date   ABDOMINAL HYSTERECTOMY     CARDIAC CATHETERIZATION  11/2011   CATARACT EXTRACTION, BILATERAL  2020   CHOLECYSTECTOMY     COLONOSCOPY  01/09/2018   per Dr. Russella Dar, adenomatous polyps, repeat in 3 yrs   CORONARY STENT PLACEMENT  2002   BMS to the LAD   ESOPHAGOGASTRODUODENOSCOPY  12/2008   HAND SURGERY  01/2006   Right Hand Surgery by Dr. Amanda Pea   LEFT HEART CATHETERIZATION WITH CORONARY ANGIOGRAM N/A 12/07/2011   Procedure: LEFT HEART CATHETERIZATION WITH CORONARY ANGIOGRAM;  Surgeon: Peter M Swaziland, MD;  Location: [facility] CATH LAB;  Service: Cardiovascular;  Laterality: N/A;   RIGHT/LEFT HEART CATH AND CORONARY ANGIOGRAPHY N/A 08/07/2021   Procedure: RIGHT/LEFT HEART CATH AND CORONARY ANGIOGRAPHY;  Surgeon: Dolores Patty, MD;  Location: [facility] INVASIVE CV LAB;  Service: Cardiovascular;  Laterality: N/A;   UPPER GASTROINTESTINAL ENDOSCOPY      Current Medications: Current Meds  Medication Sig   acetaminophen (TYLENOL) 500 MG tablet Take 500 mg by mouth every 6 (six) hours as needed for headache (pain).    albuterol (PROVENTIL) (2.5 MG/3ML) 0.083% nebulizer solution Take 3 mLs (2.5 mg total) by nebulization every 4 (four) hours as needed for wheezing or shortness of breath.   albuterol (VENTOLIN HFA) 108 (90 BASE) MCG/ACT inhaler Inhale 2 puffs into the lungs every 4 (four) hours as needed for wheezing or shortness of breath.   ALPRAZolam (XANAX) 0.5 MG tablet Take 1 tablet (0.5 mg total) by mouth 3 (three) times daily as needed.   amLODipine (NORVASC) 10 MG tablet TAKE 1 TABLET BY MOUTH EVERY DAY   atorvastatin (LIPITOR) 20 MG tablet TAKE 1 TABLET (20 MG TOTAL) BY MOUTH DAILY. PLEASE CALL [phone] TO SCHEDULE AN APPOINTMENT FOR FUTURE REFILLS. THANK YOU. 1ST ATTEMPT.   azelastine (ASTELIN) 0.1 % nasal spray Place 1 spray into both  nostrils at bedtime.   Blood Glucose Monitoring Suppl (ONETOUCH VERIO FLEX SYSTEM) w/Device KIT 1 each by Does not apply route daily at 12 noon.   budesonide (ENTOCORT EC) 3 MG 24 hr capsule Take 3 capsules (9 mg total) by mouth daily.   Cholecalciferol (VITAMIN D-3) 5000 UNITS TABS Patient takes 1 tablet by mouth once a week on Mondays.   clopidogrel (PLAVIX) 75 MG tablet Take 1 tablet (75 mg total) by mouth daily. Please call [phone] to schedule an overdue appointment with Dr. Donato Schultz for future refills. Thank you. 2nd attempt.   diphenoxylate-atropine (LOMOTIL) 2.5-0.025 MG tablet TAKE 1 TABLET BY MOUTH 2 TIMES DAILY AS NEEDED   fluticasone (FLONASE) 50 MCG/ACT nasal spray PLACE 2 SPRAYS IN EACH NOSTRIL AT BEDTIME   gabapentin (NEURONTIN)  300 MG capsule TAKE 1 CAPSULE BY MOUTH TWICE A DAY   glimepiride (AMARYL) 1 MG tablet TAKE 2 TABLETS (2 MG TOTAL) BY MOUTH DAILY WITH BREAKFAST   HYDROcodone-acetaminophen (NORCO) 10-325 MG tablet Take 1 tablet by mouth every 6 (six) hours as needed for moderate pain.   hyoscyamine (LEVBID) 0.375 MG 12 hr tablet TAKE 1 TABLET (0.375 MG TOTAL) BY MOUTH 2 (TWO) TIMES DAILY.   JANUVIA 100 MG tablet TAKE 1 TABLET BY MOUTH EVERY DAY   KLOR-CON M20 20 MEQ tablet TAKE 1 TABLET BY MOUTH TWICE A DAY   levocetirizine (XYZAL) 5 MG tablet Take 5 mg by mouth every evening.   LIDODERM 5 % APPLY 1 PATCH TO SKIN FOR 12 HOURS THEN REMOVE AND DISCARD PATCH WITHIN 72 HOURS OR AS DIRECTED   loperamide (IMODIUM) 2 MG capsule Take 4-6 mg by mouth daily.   losartan (COZAAR) 25 MG tablet Take 1 tablet (25 mg total) by mouth daily. Please call [phone] to schedule an overdue appointment with Dr. Donato Schultz for future refills. Thank you. 2nd attempt.   magnesium oxide (MAG-OX) 400 MG tablet Take 3 tablets (1,200 mg total) by mouth daily.   mesalamine (LIALDA) 1.2 g EC tablet Take 2 tablets (2.4 g total) by mouth 2 (two) times daily. NEEDS OFFICE VISIT FOR FURTHER REFILLS    metFORMIN (GLUCOPHAGE-XR) 750 MG 24 hr tablet Take 1 tablet (750 mg total) by mouth daily with breakfast.   metoprolol succinate (TOPROL XL) 100 MG 24 hr tablet Take 1 tablet (100 mg total) by mouth daily. Take with or immediately following a meal.   montelukast (SINGULAIR) 10 MG tablet Take one every morning   neomycin-polymyxin-hydrocortisone (CORTISPORIN) OTIC solution Place 4 drops into both ears 4 (four) times daily.   nitroGLYCERIN (NITROSTAT) 0.4 MG SL tablet PLACE 1 TABLET (0.4 MG TOTAL) UNDER THE TONGUE EVERY 5 (FIVE) MINUTES AS NEEDED FOR CHEST PAIN.   Olopatadine HCl 0.7 % SOLN Place 1 drop into both eyes daily as needed (itching/allergies).   OneTouch Delica Lancets 33G MISC 1 each by Does not apply route daily at 12 noon.   ONETOUCH VERIO test strip USE TO CHECK BLOOD GLUCOSE ONCE DAILY   Pancrelipase, Lip-Prot-Amyl, (ZENPEP) 20000-63000 units CPEP TAKE 2 CAPSULES (40,000 UNITS) WITH MEALS AND 1 CAPSULE (20,000 UNITS) WITH A SNACK   Pancrelipase, Lip-Prot-Amyl, (ZENPEP) 40000-126000 units CPEP Take by mouth. Take 2 tablets by mouth before meals and 1 tablet before snacks   pantoprazole (PROTONIX) 40 MG tablet Take 1 tablet (40 mg total) by mouth 2 (two) times daily. NEEDS OFFICE VISIT FOR FURTHER REFILLS   Polyethyl Glycol-Propyl Glycol (SYSTANE OP) Place 1 drop into both eyes daily. For dry eyes   TOBRADEX ophthalmic solution Place 1 drop into both eyes 4 (four) times daily. As needed   torsemide (DEMADEX) 20 MG tablet TAKE 2 TABLETS DAILY   [DISCONTINUED] hydrOXYzine (VISTARIL) 25 MG capsule TAKE 1 CAPSULE (25 MG TOTAL) BY MOUTH EVERY 6 (SIX) HOURS AS NEEDED.   [DISCONTINUED] ondansetron (ZOFRAN-ODT) 4 MG disintegrating tablet TAKE 1 TABLET BY MOUTH EVERY 8 HOURS AS NEEDED FOR NAUSEA AND VOMITING   [DISCONTINUED] tiZANidine (ZANAFLEX) 4 MG tablet TAKE 1 TABLET BY MOUTH TWICE A DAY   Current Facility-Administered Medications for the 09/25/22 encounter (Office Visit) with Levi Aland, NP  Medication   ipratropium-albuterol (DUONEB) 0.5-2.5 (3) MG/3ML nebulizer solution 3 mL     Allergies:   Doxycycline, Aspirin, Azithromycin, Caffeine, Ciprofloxacin, Codeine, Oxycodone, Sulfamethizole, and  Tramadol hcl   Social History   Socioeconomic History   Marital status: Divorced    Spouse name: Not on file   Number of children: 3   Years of education: Not on file   Highest education level: Not on file  Occupational History   Occupation: Retired    Associate Professor: RETIRED  Tobacco Use   Smoking status: Former    Packs/day: 2.00    Years: 15.00    Additional pack years: 0.00    Total pack years: 30.00    Types: Cigarettes    Quit date: 04/30/1973    Years since quitting: 49.4   Smokeless tobacco: Never   Tobacco comments:    ex-smoker: smoked for 10-15 years up to 2ppd, quit 1975.  Vaping Use   Vaping Use: Never used  Substance and Sexual Activity   Alcohol use: Not Currently    Alcohol/week: 0.0 standard drinks of alcohol    Comment: none now    Drug use: No   Sexual activity: Not Currently  Other Topics Concern   Not on file  Social History Narrative   Right handed   Social Determinants of Health   Financial Resource Strain: Low Risk  (05/24/2022)   Overall Financial Resource Strain (CARDIA)    Difficulty of Paying Living Expenses: Not hard at all  Food Insecurity: No Food Insecurity (05/24/2022)   Hunger Vital Sign    Worried About Running Out of Food in the Last Year: Never true    Ran Out of Food in the Last Year: Never true  Transportation Needs: No Transportation Needs (05/24/2022)   PRAPARE - Administrator, Civil Service (Medical): No    Lack of Transportation (Non-Medical): No  Physical Activity: Inactive (05/24/2022)   Exercise Vital Sign    Days of Exercise per Week: 0 days    Minutes of Exercise per Session: 0 min  Stress: No Stress Concern Present (05/24/2022)   [employer] of Occupational Health - Occupational Stress  Questionnaire    Feeling of Stress : Not at all  Social Connections: Moderately Integrated (05/24/2022)   Social Connection and Isolation Panel [NHANES]    Frequency of Communication with Friends and Family: More than three times a week    Frequency of Social Gatherings with Friends and Family: More than three times a week    Attends Religious Services: More than 4 times per year    Active Member of [employer] or Organizations: Yes    Attends Engineer, structural: More than 4 times per year    Marital Status: Divorced     Family History: [name] family history includes Breast cancer in her daughter; Colon polyps in her father; Diabetes in her brother and sister; Heart attack in her father; Hypertension in her sister; Parkinsonism in her mother; Pneumonia in her father; Sarcoidosis in her brother. There is no history of Colon cancer, Esophageal cancer, Rectal cancer, or Stomach cancer.  ROS:   Please see the history of present illness.  All other systems reviewed and are negative.  Labs/Other Studies Reviewed:    The following studies were reviewed today:  Echo 04/26/22 1. Left ventricular ejection fraction, by estimation, is 50 to 55%. The  left ventricle has low normal function. The left ventricle has no regional  wall motion abnormalities. Left ventricular diastolic parameters are  consistent with Grade I diastolic  dysfunction (impaired relaxation).   2. Right ventricular systolic function is normal. The right ventricular  size is mildly  enlarged. There is mildly elevated pulmonary artery  systolic pressure. The estimated right ventricular systolic pressure is  39.5 mmHg.   3. Left atrial size was moderately dilated.   4. Right atrial size was mildly dilated.   5. The mitral valve is degenerative. Mild mitral valve regurgitation.  Moderate mitral annular calcification.   6. Tricuspid valve regurgitation is mild to moderate.   7. The aortic valve was not well  visualized. Aortic valve regurgitation  is trivial. Aortic valve sclerosis/calcification is present, without any  evidence of aortic stenosis.   8. Aortic dilatation noted. There is mild dilatation of the ascending  aorta, measuring 41 mm.   9. The inferior vena cava is normal in size with greater than 50%  respiratory variability, suggesting right atrial pressure of 3 mmHg.  10. Frequent PACs throughout examination.   Comparison(s): Compared to prior TTE on 07/2021, the EF appears improved  to 50-55% (previously reported 35-40%). The filling pressures also look  significantly improved. TR now appears mild to moderate (previously  moderate to severe).  [facility] 08/07/21   Dist LAD lesion is 90% stenosed.   Prox RCA lesion is 30% stenosed.   Dist RCA lesion is 30% stenosed.   Prox LAD to Mid LAD lesion is 30% stenosed.   The left ventricular ejection fraction is 55-65% by visual estimate.   Findings:   Ao = 167/84 (118) LV = 152/11 RA =  8 RV = 64/11 PA = 68/26 (45) PCW = 15 Fick cardiac output/index = 6.0/3.0 PVR = 5.0 WU FA sat = 92% PA sat = 66%, 67%   Assessment: 1. Predominantly mild to moderate non-obstructive CAD with 90% lesion in apical LAD 2. Normal LV function 3. Moderate PAH   Plan/Discussion:    Medical therapy for CAD. Likely Group 3 PAH. Will need evaluation for OSA/OHS.  Recent Labs: 09/27/2021: B Natriuretic Peptide 71.2 09/04/2022: ALT 5; BUN 15; Creatinine, Ser 0.80; Hemoglobin 11.8; Platelets 532.0; Potassium 3.9; Sodium 136; TSH 1.41  Recent Lipid Panel    Component Value Date/Time   CHOL 146 06/16/2021 1126   CHOL 127 12/10/2019 0744   TRIG 104.0 06/16/2021 1126   HDL 58.60 06/16/2021 1126   HDL 59 12/10/2019 0744   CHOLHDL 2 06/16/2021 1126   VLDL 20.8 06/16/2021 1126   LDLCALC 66 06/16/2021 1126   LDLCALC 51 12/10/2019 0744     Risk Assessment/Calculations:           Physical Exam:    VS:  BP 108/86   Pulse 67   Ht 5' (1.524 m)    Wt 209 lb 9.6 oz (95.1 kg)   LMP  (LMP Unknown)   SpO2 97%   BMI 40.93 kg/m     Wt Readings from Last 3 Encounters:  09/25/22 209 lb 9.6 oz (95.1 kg)  09/04/22 194 lb (88 kg)  05/31/22 213 lb 9.6 oz (96.9 kg)     GEN:  Obese, well developed in no acute distress HEENT: Normal NECK: No JVD; No carotid bruits CARDIAC: RRR, no murmurs, rubs, gallops RESPIRATORY:  Clear to auscultation without rales, wheezing or rhonchi  ABDOMEN: Soft, non-tender, non-distended MUSCULOSKELETAL:  Bilateral LE edema; No deformity. 2+ pedal pulses, equal bilaterally SKIN: Warm and dry NEUROLOGIC:  Alert and oriented x 3 PSYCHIATRIC:  Normal affect   EKG:  EKG is ordered today.  The ekg ordered today demonstrates sinus rhythm with PAC at 82 bpm, nonspecific ST abnormality, prolonged QT with QTc 514 (by Bazett calculator)  Diagnoses:    1. Chronic combined systolic and diastolic CHF, NYHA class 3 (HCC)   2. OSA (obstructive sleep apnea)   3. Pulmonary hypertension, unspecified (HCC)   4. Coronary artery disease involving native coronary artery of native heart without angina pectoris   5. QT prolongation   6. Diarrhea, unspecified type   7. Pure hypercholesterolemia    Assessment and Plan:     QT prolongation: She has prolonged QT on EKG with QTc of 514 using Bazett calculator. Reviewed with Dr. Shari Prows, DOD along with medication list reviewed with Malena Peer, [facility].  Patient and her son who handles her medication were advised that QT prolonging agents include Zofran, hydroxyzine, loperamide, Albuterol, and Zanaflex. Encouraged to d/c any of these medications if possible but if needed for symptom management to use sparingly. Patient is asymptomatic. We will have her return in 2 weeks for repeat EKG.  Chronic combined CHF: NYHA Class III-IV. She does not provide specific details about activity tolerance and functional status. Lives with son who handles medications. Only reported activity is  occasional shopping. Echo 04/26/2022 revealed mildly reduced LVEF 50 to 55%, G1 DD, mildly enlarged RV with normal function. Volume status is difficult to assess due to body habitus.  She reports chronic stable dyspnea on exertion and lower extremity edema.  No orthopnea or PND.  Feels that home weight is stable. 2L fluid limit, low sodium diet, and daily weight encouraged.  Continue torsemide, losartan, metoprolol.   Pulmonary hypertension/OSA: Right heart cath 07/2021 consistent with moderate PAH, likely group 3 with right and left filling pressures not markedly high per Dr. Gala Romney.  Reports compliance with CPAP.  Advised to have repeat echo and follow-up with Dr. Gala Romney October 2023. TTE 04/26/22 revealed mildly elevated PASP, RVSP 39.5, RV normal size. She reports stable dyspnea.  No orthopnea, PND.  Functional capacity is difficult to assess. Recommend follow-up in 6 months, sooner if symptoms worsen.   Diarrhea: Reports having frequent bouts of diarrhea, seeing GI for this. Is holding magnesium oxide and metformin to see if this improves symptoms. Taking Imodium 2-3 times daily. Advised to limit use of Imodium and Zofran due to QT prolongation. Continue to follow-up with GI.   CAD without angina: History of stent to LAD in 2002. Moderate nonobstructive CAD by cath 08/07/2021 with recommendation for medical management.  She reports occasional chest pain, does not seem bothered by these. No symptoms concerning for worsening angina.  No bleeding concerns.  Continue GDMT including metoprolol, losartan, Plavix, atorvastatin.  Hyperlipidemia LDL goal < 55: LDL 66 on 06/16/21. Not specifically addressed today.      Disposition: 2 weeks for repeat EKG/6 months with Dr. Anne Fu  Medication Adjustments/Labs and Tests Ordered: Current medicines are reviewed at length with the patient today.  Concerns regarding medicines are outlined above.  Orders Placed This Encounter  Procedures   EKG 12-Lead   No  orders of the defined types were placed in this encounter.   Patient Instructions  Medication Instructions:   DISCONTINUE Hydroxine.  Please try to only take 2 tablets of imodium daily or less.   Your physician recommends that you continue on your current medications as directed. Please refer to the Current Medication list given to you today.   *If you need a refill on your cardiac medications before your next appointment, please call your pharmacy*   Lab Work:  None ordered.  If you have labs (blood work) drawn today and your tests are completely normal, you  will receive your results only by: MyChart Message (if you have MyChart) OR A paper copy in the mail If you have any lab test that is abnormal or we need to change your treatment, we will call you to review the results.   Testing/Procedures:  None ordered.   Follow-Up: At [facility], you and your health needs are our priority.  As part of our continuing mission to provide you with exceptional heart care, we have created designated Provider Care Teams.  These Care Teams include your primary Cardiologist (physician) and Advanced Practice Providers (APPs -  Physician Assistants and Nurse Practitioners) who all work together to provide you with the care you need, when you need it.  We recommend signing up for the patient portal called "MyChart".  Sign up information is provided on this After Visit Summary.  MyChart is used to connect with patients for Virtual Visits (Telemedicine).  Patients are able to view lab/test results, encounter notes, upcoming appointments, etc.  Non-urgent messages can be sent to your provider as well.   To learn more about what you can do with MyChart, go to [email or web address].    Your next appointment:   6 month(s)  Provider:   Donato Schultz, MD     Other Instructions  Nurse visit in two weeks. June 11 @ 3:00 pm.        Hyman Bible, NP  09/25/2022 5:26 PM     [facility]

## 2022-09-25 ENCOUNTER — Other Ambulatory Visit: Payer: Self-pay | Admitting: Family Medicine

## 2022-09-25 ENCOUNTER — Ambulatory Visit: Payer: PPO | Attending: Nurse Practitioner | Admitting: Nurse Practitioner

## 2022-09-25 ENCOUNTER — Encounter: Payer: Self-pay | Admitting: Nurse Practitioner

## 2022-09-25 ENCOUNTER — Other Ambulatory Visit: Payer: Self-pay | Admitting: Gastroenterology

## 2022-09-25 VITALS — BP 108/86 | HR 67 | Ht 60.0 in | Wt 209.6 lb

## 2022-09-25 DIAGNOSIS — G4733 Obstructive sleep apnea (adult) (pediatric): Secondary | ICD-10-CM

## 2022-09-25 DIAGNOSIS — I5042 Chronic combined systolic (congestive) and diastolic (congestive) heart failure: Secondary | ICD-10-CM

## 2022-09-25 DIAGNOSIS — R197 Diarrhea, unspecified: Secondary | ICD-10-CM

## 2022-09-25 DIAGNOSIS — E7800 Pure hypercholesterolemia, unspecified: Secondary | ICD-10-CM

## 2022-09-25 DIAGNOSIS — I2510 Atherosclerotic heart disease of native coronary artery without angina pectoris: Secondary | ICD-10-CM

## 2022-09-25 DIAGNOSIS — R9431 Abnormal electrocardiogram [ECG] [EKG]: Secondary | ICD-10-CM | POA: Diagnosis not present

## 2022-09-25 DIAGNOSIS — I272 Pulmonary hypertension, unspecified: Secondary | ICD-10-CM | POA: Diagnosis not present

## 2022-09-25 NOTE — Patient Instructions (Addendum)
Medication Instructions:   DISCONTINUE Hydroxine.  Please try to only take 2 tablets of imodium daily or less.   Your physician recommends that you continue on your current medications as directed. Please refer to the Current Medication list given to you today.   *If you need a refill on your cardiac medications before your next appointment, please call your pharmacy*   Lab Work:  None ordered.  If you have labs (blood work) drawn today and your tests are completely normal, you will receive your results only by: MyChart Message (if you have MyChart) OR A paper copy in the mail If you have any lab test that is abnormal or we need to change your treatment, we will call you to review the results.   Testing/Procedures:  None ordered.   Follow-Up: At [facility], you and your health needs are our priority.  As part of our continuing mission to provide you with exceptional heart care, we have created designated Provider Care Teams.  These Care Teams include your primary Cardiologist (physician) and Advanced Practice Providers (APPs -  Physician Assistants and Nurse Practitioners) who all work together to provide you with the care you need, when you need it.  We recommend signing up for the patient portal called "MyChart".  Sign up information is provided on this After Visit Summary.  MyChart is used to connect with patients for Virtual Visits (Telemedicine).  Patients are able to view lab/test results, encounter notes, upcoming appointments, etc.  Non-urgent messages can be sent to your provider as well.   To learn more about what you can do with MyChart, go to [email or web address].    Your next appointment:   6 month(s)  Provider:   Donato Schultz, MD     Other Instructions  Nurse visit in two weeks. June 11 @ 3:00 pm.

## 2022-09-26 ENCOUNTER — Telehealth: Payer: Self-pay | Admitting: *Deleted

## 2022-09-26 NOTE — Telephone Encounter (Signed)
-----   Message from Levi Aland, NP sent at 09/25/2022  5:06 PM EDT ----- Please call patient and notify her son that Zanaflex is another medication that can cause QT prolongation that we did not discuss today. We recommend that she discontinue the medication.   Thanks!

## 2022-09-26 NOTE — Telephone Encounter (Signed)
S/w pt's son per (DPR) changed pt's medication according to yesterday's ov. Son advised pt only to take 1 imodium today.  Will remove or discuss the tinazidine/zanaflex.

## 2022-09-27 ENCOUNTER — Telehealth: Payer: Self-pay | Admitting: Gastroenterology

## 2022-09-27 MED ORDER — DIPHENOXYLATE-ATROPINE 2.5-0.025 MG PO TABS: ORAL_TABLET | ORAL | 1 refills | Status: DC

## 2022-09-27 NOTE — Telephone Encounter (Signed)
Prescription faxed to patient's pharmacy. 

## 2022-09-27 NOTE — Progress Notes (Signed)
Care Management & Coordination Services Pharmacy Note  10/01/2022 Name:  [name] MRN:  [id number] DOB:  [birth year]  Summary: BP at goal <140/90 A1C not at goal <7 --has stopped Metformin due to stomach upset, reports sugars WNL at home  Recommendations/Changes made from today's visit: -Counseled to check BP once weekly and keep a log  -Counseled on importance of med adherence and taking medications as directed -Counseled to check BG at least once daily at varying times and reminded of BG goals  Follow up plan: DM review call in 1 and 2 months Pharmacist visit in 3 months   Subjective: [name] is an 82 y.o. year old female who is a primary patient of Nelwyn Salisbury, MD.  The care coordination team was consulted for assistance with disease management and care coordination needs.    Engaged with patient by telephone for follow up visit.  Recent office visits: 07/03/22 Nelwyn Salisbury, MD - Patient presented for Chronic narcotic use and other concerns Prescribed Hydrocodone- Acetaminophen.   05/24/22 Tillie Rung, LPN - Patient presented for Medicare Annual Wellness exam. No medication changes.   Recent consult visits: 09/26/22 Brien Mates [facility]) - Phone Call - stop tizanidine, decrease imodium. Only use zofran, hydroxyzine, and albuterol sparingly and try to stop completely if possible  09/25/22 Eligha Bridegroom, NP (Cardio) - CHF and other concerns  09/04/22 Claudette Head, MD [facility]ure[facility] Schimke) - For diarrhea. No medication changes  05/31/22 Parrett, Virgel Bouquet, NP (Pulmonology) - Patient presented for Chronic respiratory failure with hypoxia and other concerns. No medication changes.    02/22/22 Parrett, Virgel Bouquet, NP (Pulmonology) - Patient presented for Chronic respiratory failure with hypoxia and other concerns. No medication changes  Hospital visits: None in previous 6 months   Objective:  Lab Results  Component Value Date   CREATININE 0.80 09/04/2022   BUN 15  09/04/2022   GFR 69.74 09/04/2022   GFRNONAA >60 11/22/2021   GFRAA 102 09/08/2019   NA 136 09/04/2022   K 3.9 09/04/2022   CALCIUM 9.3 09/04/2022   CO2 28 09/04/2022   GLUCOSE 153 (H) 09/04/2022    Lab Results  Component Value Date/Time   HGBA1C 7.8 (H) 06/16/2021 11:26 AM   HGBA1C 7.7 (H) 07/18/2020 11:43 AM   GFR 69.74 09/04/2022 10:30 AM   GFR 83.44 06/16/2021 11:26 AM    Last diabetic Eye exam:  Lab Results  Component Value Date/Time   HMDIABEYEEXA No Retinopathy 08/20/2019 12:00 AM    Last diabetic Foot exam: No results found for: "HMDIABFOOTEX"   Lab Results  Component Value Date   CHOL 146 06/16/2021   HDL 58.60 06/16/2021   LDLCALC 66 06/16/2021   TRIG 104.0 06/16/2021   CHOLHDL 2 06/16/2021       Latest Ref Rng & Units 09/04/2022   10:30 AM 08/10/2021    3:07 AM 08/04/2021    1:45 AM  Hepatic Function  Total Protein 6.0 - 8.3 g/dL 7.4   5.6   Albumin 3.5 - 5.2 g/dL 4.0  3.3  3.0   AST 0 - 37 U/L 10   108   ALT 0 - 35 U/L 5   49   Alk Phosphatase 39 - 117 U/L 79   92   Total Bilirubin 0.2 - 1.2 mg/dL 0.4   0.7     Lab Results  Component Value Date/Time   TSH 1.41 09/04/2022 10:30 AM   TSH 1.27 06/16/2021 11:26 AM   FREET4 0.86 07/18/2020 11:43 AM  Latest Ref Rng & Units 09/04/2022   10:30 AM 08/16/2021    2:00 PM 08/11/2021    2:34 AM  CBC  WBC 4.0 - 10.5 K/uL 11.2  13.6  12.7   Hemoglobin 12.0 - 15.0 g/dL 86.5  78.4  69.6   Hematocrit 36.0 - 46.0 % 35.4  42.9  39.5   Platelets 150.0 - 400.0 K/uL 532.0  577  413     Lab Results  Component Value Date/Time   VD25OH 29 (L) 08/11/2013 11:46 AM   VD25OH 39 08/06/2012 11:15 AM   VITAMINB12 323 02/25/2019 04:31 PM    Clinical ASCVD: Yes  The ASCVD Risk score (Arnett DK, et al., 2019) failed to calculate for the following reasons:   The 2019 ASCVD risk score is only valid for ages 49 to 82       05/24/2022    9:10 AM 08/21/2021    3:10 PM 06/16/2021   10:21 AM  Depression screen PHQ 2/9   Decreased Interest 0 0 1  Down, Depressed, Hopeless 0  0  PHQ - 2 Score 0 0 1  Altered sleeping   2  Tired, decreased energy  1 3  Change in appetite  3 3  Feeling bad or failure about yourself   0   Trouble concentrating  0 0  Moving slowly or fidgety/restless  0 0  Suicidal thoughts  0 0  PHQ-9 Score   9     Social History   Tobacco Use  Smoking Status Former   Packs/day: 2.00   Years: 15.00   Additional pack years: 0.00   Total pack years: 30.00   Types: Cigarettes   Quit date: 04/30/1973   Years since quitting: 49.4  Smokeless Tobacco Never  Tobacco Comments   ex-smoker: smoked for 10-15 years up to 2ppd, quit 1975.   BP Readings from Last 3 Encounters:  09/25/22 108/86  09/04/22 98/62  05/31/22 118/80   Pulse Readings from Last 3 Encounters:  09/25/22 67  09/04/22 68  05/31/22 77   Wt Readings from Last 3 Encounters:  09/25/22 209 lb 9.6 oz (95.1 kg)  09/04/22 194 lb (88 kg)  05/31/22 213 lb 9.6 oz (96.9 kg)   BMI Readings from Last 3 Encounters:  09/25/22 40.93 kg/m  09/04/22 37.89 kg/m  05/31/22 41.72 kg/m    Allergies  Allergen Reactions   Doxycycline Other (See Comments)    Unsteady gait   Aspirin Nausea And Vomiting   Azithromycin Other (See Comments)    REACTION: pt states "ZPak doesn't work"   Caffeine Nausea And Vomiting   Ciprofloxacin Nausea And Vomiting    Tolerates levaquin   Codeine Nausea And Vomiting   Oxycodone Other (See Comments)    Pt stated, "upsets my stomach" (11/19/17)   Sulfamethizole Other (See Comments)    Unknown reaction   Tramadol Hcl Other (See Comments)    REACTION: pt states "spaced-out"    Medications Reviewed Today     Reviewed by Levi Aland, NP (Nurse Practitioner) on 09/25/22 at 1726  Med List Status: <None>   Medication Order Taking? Sig Documenting Provider Last Dose Status Informant  acetaminophen (TYLENOL) 500 MG tablet [id number] Yes Take 500 mg by mouth every 6 (six) hours as needed for  headache (pain).  [provider] Taking Active Family Member  albuterol (PROVENTIL) (2.5 MG/3ML) 0.083% nebulizer solution [id number] Yes Take 3 mLs (2.5 mg total) by nebulization every 4 (four) hours as needed for wheezing or shortness  of breath. Nelwyn Salisbury, MD Taking Active Family Member  albuterol (VENTOLIN HFA) 108 (90 BASE) MCG/ACT inhaler [id number] Yes Inhale 2 puffs into the lungs every 4 (four) hours as needed for wheezing or shortness of breath. Nelwyn Salisbury, MD Taking Active Family Member  ALPRAZolam Prudy Feeler) 0.5 MG tablet [id number] Yes Take 1 tablet (0.5 mg total) by mouth 3 (three) times daily as needed. Nelwyn Salisbury, MD Taking Active   amLODipine (NORVASC) 10 MG tablet [id number] Yes TAKE 1 TABLET BY MOUTH EVERY DAY Jake Bathe, MD Taking Active   atorvastatin (LIPITOR) 20 MG tablet [id number] Yes TAKE 1 TABLET (20 MG TOTAL) BY MOUTH DAILY. PLEASE CALL [phone] TO SCHEDULE AN APPOINTMENT FOR FUTURE REFILLS. THANK YOU. 1ST ATTEMPT. Jake Bathe, MD Taking Active   azelastine (ASTELIN) 0.1 % nasal spray [id number] Yes Place 1 spray into both nostrils at bedtime. [provider] Taking Active Family Member  Blood Glucose Monitoring Suppl (ONETOUCH VERIO FLEX SYSTEM) w/Device KIT [id number] Yes 1 each by Does not apply route daily at 12 noon. Nelwyn Salisbury, MD Taking Active Family Member  budesonide (ENTOCORT EC) 3 MG 24 hr capsule [id number] Yes Take 3 capsules (9 mg total) by mouth daily. Meryl Dare, MD Taking Active   Cholecalciferol (VITAMIN D-3) 5000 UNITS TABS [id number] Yes Patient takes 1 tablet by mouth once a week on Mondays. [provider] Taking Active Family Member  clopidogrel (PLAVIX) 75 MG tablet [id number] Yes Take 1 tablet (75 mg total) by mouth daily. Please call [phone] to schedule an overdue appointment with Dr. Donato Schultz for future refills. Thank you. 2nd attempt. Jake Bathe, MD Taking Active   diphenoxylate-atropine  (LOMOTIL) 2.5-0.025 MG tablet [id number] Yes TAKE 1 TABLET BY MOUTH 2 TIMES DAILY AS NEEDED Meryl Dare, MD Taking Active   fluticasone [facility]) 50 MCG/ACT nasal spray [id number] Yes PLACE 2 SPRAYS IN EACH NOSTRIL AT BEDTIME Nelwyn Salisbury, MD Taking Active Family Member  gabapentin (NEURONTIN) 300 MG capsule [id number] Yes TAKE 1 CAPSULE BY MOUTH TWICE A DAY Nelwyn Salisbury, MD Taking Active   glimepiride (AMARYL) 1 MG tablet [id number] Yes TAKE 2 TABLETS (2 MG TOTAL) BY MOUTH DAILY WITH BREAKFAST Nelwyn Salisbury, MD Taking Active   HYDROcodone-acetaminophen [facility]) 10-325 MG tablet [id number] Yes Take 1 tablet by mouth every 6 (six) hours as needed for moderate pain. Nelwyn Salisbury, MD Taking Active   hyoscyamine (LEVBID) 0.375 MG 12 hr tablet [id number] Yes TAKE 1 TABLET (0.375 MG TOTAL) BY MOUTH 2 (TWO) TIMES DAILY. Meryl Dare, MD Taking Active   ipratropium-albuterol (DUONEB) 0.5-2.5 (3) MG/3ML nebulizer solution 3 mL [id number]   Shirline Frees, NP  Active   JANUVIA 100 MG tablet [id number] Yes TAKE 1 TABLET BY MOUTH EVERY DAY Nelwyn Salisbury, MD Taking Active   KLOR-CON M20 20 MEQ tablet [id number] Yes TAKE 1 TABLET BY MOUTH TWICE A DAY Nelwyn Salisbury, MD Taking Active   levocetirizine (XYZAL) 5 MG tablet [id number] Yes Take 5 mg by mouth every evening. [provider] Taking Active Family Member  LIDODERM 5 % [id number] Yes APPLY 1 PATCH TO SKIN FOR 12 HOURS THEN REMOVE AND DISCARD PATCH WITHIN 72 HOURS OR AS DIRECTED Michele Mcalpine, MD Taking Active Family Member           Med Note [facility], CHASITIE R   Thu Sep 29, 2015 10:05 AM)    loperamide (IMODIUM) 2 MG capsule [id number] Yes Take 4-6  mg by mouth daily. [provider] Taking Active Family Member  losartan (COZAAR) 25 MG tablet [id number] Yes Take 1 tablet (25 mg total) by mouth daily. Please call [phone] to schedule an overdue appointment with Dr. Donato Schultz for future refills. Thank you. 2nd attempt. Jake Bathe, MD  Taking Active   magnesium oxide (MAG-OX) 400 MG tablet [id number] Yes Take 3 tablets (1,200 mg total) by mouth daily. Nelwyn Salisbury, MD Taking Active   mesalamine (LIALDA) 1.2 g EC tablet [id number] Yes Take 2 tablets (2.4 g total) by mouth 2 (two) times daily. NEEDS OFFICE VISIT FOR FURTHER REFILLS Meryl Dare, MD Taking Active   metFORMIN (GLUCOPHAGE-XR) 750 MG 24 hr tablet [id number] Yes Take 1 tablet (750 mg total) by mouth daily with breakfast. Nelwyn Salisbury, MD Taking Active   metoprolol succinate (TOPROL XL) 100 MG 24 hr tablet [id number] Yes Take 1 tablet (100 mg total) by mouth daily. Take with or immediately following a meal. Nelwyn Salisbury, MD Taking Active   montelukast (SINGULAIR) 10 MG tablet [id number] Yes Take one every morning Nelwyn Salisbury, MD Taking Active Family Member  neomycin-polymyxin-hydrocortisone [facility]) OTIC solution [id number] Yes Place 4 drops into both ears 4 (four) times daily. Nelwyn Salisbury, MD Taking Active Family Member  nitroGLYCERIN (NITROSTAT) 0.4 MG SL tablet [id number] Yes PLACE 1 TABLET (0.4 MG TOTAL) UNDER THE TONGUE EVERY 5 (FIVE) MINUTES AS NEEDED FOR CHEST PAIN. Jake Bathe, MD Taking Active Family Member  Olopatadine HCl 0.7 % SOLN [id number] Yes Place 1 drop into both eyes daily as needed (itching/allergies). [provider] Taking Active Family Member  OneTouch Delica Lancets 33G [place] [id number] Yes 1 each by Does not apply route daily at 12 noon. Nelwyn Salisbury, MD Taking Active Family Member  [facility] VERIO test strip [id number] Yes USE TO CHECK BLOOD GLUCOSE ONCE DAILY Nelwyn Salisbury, MD Taking Active   Pancrelipase, Lip-Prot-Amyl, (ZENPEP) 20000-63000 units CPEP [id number] Yes TAKE 2 CAPSULES (40,000 UNITS) WITH MEALS AND 1 CAPSULE (20,000 UNITS) WITH A Waneta Martins, MD Taking Active   Pancrelipase, Lip-Prot-Amyl, (ZENPEP) 40000-126000 units CPEP [id number] Yes Take by mouth. Take 2 tablets by mouth before meals and 1 tablet  before snacks [provider] Taking Active   pantoprazole (PROTONIX) 40 MG tablet [id number] Yes Take 1 tablet (40 mg total) by mouth 2 (two) times daily. NEEDS OFFICE VISIT FOR FURTHER REFILLS Meryl Dare, MD Taking Active   Polyethyl Glycol-Propyl Glycol [facility]) [id number] Yes Place 1 drop into both eyes daily. For dry eyes [provider] Taking Active Family Member  TOBRADEX ophthalmic solution [id number] Yes Place 1 drop into both eyes 4 (four) times daily. As needed [provider] Taking Active Family Member  torsemide (DEMADEX) 20 MG tablet [id number] Yes TAKE 2 TABLETS DAILY Nelwyn Salisbury, MD Taking Active             SDOH:  (Social Determinants of Health) assessments and interventions performed: Yes SDOH Interventions    Flowsheet Row Care Coordination from 10/01/2022 in [facility] Clinical Support from 05/24/2022 in [facility] at [place] Clinical Support from 05/11/2020 in [facility] [place] Chronic Care Management from 09/10/2019 in [facility] [place]  SDOH Interventions      Food Insecurity Interventions Intervention Not Indicated Intervention Not Indicated Intervention Not Indicated --  Housing Interventions Intervention Not Indicated Intervention Not Indicated Intervention Not Indicated --  Transportation Interventions -- Intervention Not Indicated Intervention Not Indicated Intervention Not Indicated  Utilities Interventions -- Intervention Not Indicated -- --  Alcohol Usage Interventions -- Intervention Not Indicated (Score <7) -- --  Depression Interventions/Treatment  -- -- MVH8-4 Score <4 Follow-up Not Indicated --  Financial Strain Interventions -- Intervention Not Indicated Intervention Not Indicated Intervention Not Indicated  Physical Activity Interventions -- Intervention Not Indicated Intervention Not Indicated --  Stress Interventions --  Intervention Not Indicated Intervention Not Indicated --  Social Connections Interventions -- Intervention Not Indicated Intervention Not Indicated --       Medication Assistance: None required.  Patient affirms current coverage meets needs.  Medication Access: Name and location of current pharmacy:  [id number] [place] [place] - [address] [place] [address] Phone: [phone] Fax: [phone]  [id number] [place], [place] - [address] [place] [address] Phone: [phone] Fax: [phone]  [id number] [place],  - [address] AT [facility] [address] [place] [address] Phone: [phone] Fax: [phone]  [id number] [place], [place] - [address] AT [address] [place] [address] Phone: [phone] Fax: [phone]  Within the past 30 days, how often has patient missed a dose of medication? None Is a pillbox or other method used to improve adherence? Yes  Factors that may affect medication adherence?  Pill burden, memory (son helps manage meds) Are meds synced by current pharmacy? No  Are meds delivered by current pharmacy? No  Does patient experience delays in picking up medications due to transportation concerns? No   Compliance/Adherence/Medication fill history: Care Gaps: Diabetic Kidney Eval - never done Tdap vaccine  -never done Shingles Vaccine  -never done Foot exam - last 07/01/2018 A1C - last 06/16/21 Eye Exam - last 07/31/19 COVID Vaccine -  last 03/2020  Star-Rating Drugs: Atorvastatin 20mg  PDC 100% Glimepiride 1mg  PDC 88% LF 09/26/22 90 DS Januvia 100mg  PDC 100% Losartan 25mg  PDC 64% - last filled 09/17/22 15 DS Metformin 750mg  PDC 100%   Assessment/Plan Hypertension (BP goal <140/90) -Controlled -Current treatment: Amlodipine 10mg  1 qd Appropriate, Effective,  Safe, Accessible Losartan 25mg  1 qd Appropriate, Effective, Safe, Accessible Metoprolol XL 100mg  1 qd Appropriate, Effective, Safe, Accessible Torsemide 20mg  2 tabs qd Appropriate, Effective, Safe, Accessible -Medications previously tried: Atenolol, Lasix -Current home readings: no blood pressure machine at home -Current dietary habits: mindful of salt intake -Current exercise habits: not discussed -Denies hypotensive/hypertensive symptoms -Educated on BP goals and benefits of medications for prevention of heart attack, stroke and kidney damage; Daily salt intake goal < 2300 mg; Importance of home blood pressure monitoring; Proper BP monitoring technique; Symptoms of hypotension and importance of maintaining adequate hydration; -Counseled to monitor BP at home weekly, document, and provide log at future appointments -Recommended to continue current medication  Diabetes (A1c goal <7%) -Uncontrolled -Current medications: Metformin XR 750mg  1 qd ---stopped due to diarrhea -  was taking consistently Januvia 100mg  1 qd Appropriate, Effective, Safe, Accessible Glimepiride 1 mg 2 qd with breakfast Appropriate, Effective, Safe, Accessible -Medications previously tried: None  -Current home glucose readings - checking about once a day  Denies any sugars less than 70 fasting glucose: denies any readings above 130 post prandial glucose: denies any above 200 -Denies hypoglycemic/hyperglycemic symptoms -Has eye exam scheduled for tmrw -Current meal patterns:  Tries to be mindful of  carbs in her food drinks: water -Current exercise: Not discussed -Educated on A1c and blood sugar goals; Complications of diabetes including kidney damage, retinal damage, and cardiovascular disease; Prevention and management of hypoglycemic episodes; Benefits of routine self-monitoring of blood sugar; -Counseled to check feet daily and get yearly eye exams -Counseled on diet and exercise extensively Recommended  to continue current medication  Sherrill Raring Clinical Pharmacist [phone]

## 2022-09-27 NOTE — Telephone Encounter (Signed)
Patient's son called requesting a [facility]ill [facility] [address] Please advise, thank you.

## 2022-09-28 ENCOUNTER — Telehealth: Payer: Self-pay

## 2022-09-28 NOTE — Progress Notes (Signed)
Patient ID: [name], female   DOB: July 13, 1940, 82 y.o.   MRN: [id number] Care Management & Coordination Services Pharmacy Team  Reason for Encounter: Appointment Reminder  Contacted patient to confirm telephone appointment with Milas Kocher, PharmD on 10/01/22 at 9. Spoke with Son Will  on 09/28/2022     Hospital visits:  None in previous 6 months   Star Rating Drugs:  Atorvastatin 20 mg - [facility] Glimepiride 1 mg - Last filled  [facility]/26/22 [facility] Losartan 25 mg - Last filled [facility]/17/22 15 [facility] Metformin 750 mg - [facility]22[facility]   Care Gaps: Diabetic Urine - Overdue TDAP - Overdue Zoster Vaccine - Overdue Foot Exam - Overdue Eye Exam - Overdue A1C - Overdue COVID Booster - Overdue Colonoscopy - Postponed AWV - 05/24/22   Pamala Duffel CMA Clinical Pharmacist Assistant [phone]

## 2022-10-01 ENCOUNTER — Telehealth: Payer: Self-pay | Admitting: Gastroenterology

## 2022-10-01 ENCOUNTER — Ambulatory Visit: Payer: PPO

## 2022-10-01 ENCOUNTER — Other Ambulatory Visit: Payer: Self-pay

## 2022-10-01 MED ORDER — LOSARTAN POTASSIUM 25 MG PO TABS: 25.0000 mg | ORAL_TABLET | Freq: Every day | ORAL | 3 refills | Status: DC

## 2022-10-01 NOTE — Telephone Encounter (Signed)
I spoke with the pt husband and advised that he should speak with his insurance and see what med they will cover and we can send that in if ok'd by Dr Russella Dar.  The pt has been advised of the information and verbalized understanding.

## 2022-10-01 NOTE — Telephone Encounter (Signed)
Insurance will no longer cover hyoscyamine and they need a replacement. [facility] [address] Requesting call back

## 2022-10-02 DIAGNOSIS — J984 Other disorders of lung: Secondary | ICD-10-CM | POA: Diagnosis not present

## 2022-10-04 ENCOUNTER — Encounter: Payer: Self-pay | Admitting: Gastroenterology

## 2022-10-08 ENCOUNTER — Ambulatory Visit (INDEPENDENT_AMBULATORY_CARE_PROVIDER_SITE_OTHER): Payer: PPO | Admitting: Gastroenterology

## 2022-10-08 ENCOUNTER — Encounter: Payer: Self-pay | Admitting: Gastroenterology

## 2022-10-08 VITALS — BP 122/70 | HR 68 | Ht 60.0 in | Wt 192.0 lb

## 2022-10-08 DIAGNOSIS — K5150 Left sided colitis without complications: Secondary | ICD-10-CM

## 2022-10-08 DIAGNOSIS — K219 Gastro-esophageal reflux disease without esophagitis: Secondary | ICD-10-CM

## 2022-10-08 DIAGNOSIS — K8689 Other specified diseases of pancreas: Secondary | ICD-10-CM | POA: Diagnosis not present

## 2022-10-08 MED ORDER — DICYCLOMINE HCL 10 MG PO CAPS: 10.0000 mg | ORAL_CAPSULE | Freq: Three times a day (TID) | ORAL | 3 refills | Status: DC | PRN

## 2022-10-08 NOTE — Patient Instructions (Signed)
We have sent the following medications to your pharmacy for you to pick up at your convenience:  Dicyclomine  Please follow up in one year.  _______________________________________________________  If your blood pressure at your visit was 140/90 or greater, please contact your primary care physician to follow up on this.  _______________________________________________________  If you are age 80 or older, your body mass index should be between 23-30. Your Body mass index is 37.5 kg/m. If this is out of the aforementioned range listed, please consider follow up with your Primary Care Provider.  If you are age 44 or younger, your body mass index should be between 19-25. Your Body mass index is 37.5 kg/m. If this is out of the aformentioned range listed, please consider follow up with your Primary Care Provider.   ________________________________________________________  The [place] [facility] providers would like to encourage you to use [facility] to communicate with providers for non-urgent requests or questions.  Due to long hold times on the telephone, sending your provider a message by [facility] may be a faster and more efficient way to get a response.  Please allow 48 business hours for a response.  Please remember that this is for non-urgent requests.  _______________________________________________________

## 2022-10-08 NOTE — Progress Notes (Signed)
    Assessment     Left-sided UC flare, improved on budesonide Pancreatic insufficiency  Mild normocytic anemia GERD IBS-D Personal history of tubular adenomas and sessile serrated polyps   Recommendations    Continue budesonide 9 mg qd for 3 months then discontinue Continue Lialda 2.4 g p.o. twice daily Continue Zenpep 40,000 take 2 p.o. before meals 1 p.o. before snacks Fe, TIBC, ferritine Continue pantoprazole 40 mg twice daily and follow standard antireflux measures Change to dicyclomine 10 mg p.o. 3 times daily as needed Continue Imodium 3 times daily as needed or Lomotil twice daily as needed Avoid foods and beverages that exacerbate GI symptoms Given age and comorbidities she is no longer under UC or polyp surveillance  REV in 1 year   HPI    [name] with left sided UC, pancreatic insufficiency.  She is accompanied by her daughter.  Her son closely supervises her medications.  Her fecal calprotectin was elevated at 943. Stool studies were otherwise were negative. Budesonide started in on May 20 and her diarrhea has resolved.  She relates chronic problems with lower abdominal pain associated with several morning bowel movements which has been typical of her IBS.  Unfortunately she relates that glycopyrrolate and hyoscyamine are not covered by her insurance.  Glycopyrrolate was her preferred medication for IBS.   Labs / Imaging       Latest Ref Rng & Units 09/04/2022   10:30 AM 08/10/2021    3:07 AM 08/04/2021    1:45 AM  Hepatic Function  Total Protein 6.0 - 8.3 g/dL 7.4   5.6   Albumin 3.5 - 5.2 g/dL 4.0  3.3  3.0   AST 0 - 37 U/L 10   108   ALT 0 - 35 U/L 5   49   Alk Phosphatase 39 - 117 U/L 79   92   Total Bilirubin 0.2 - 1.2 mg/dL 0.4   0.7        Latest Ref Rng & Units 09/04/2022   10:30 AM 08/16/2021    2:00 PM 08/11/2021    2:34 AM  CBC  WBC 4.0 - 10.5 K/uL 11.2  13.6  12.7   Hemoglobin 12.0 - 15.0 g/dL 16.1  09.6  04.5   Hematocrit  36.0 - 46.0 % 35.4  42.9  39.5   Platelets 150.0 - 400.0 K/uL 532.0  577  413    Current Medications, Allergies, Past Medical History, Past Surgical History, Family History and Social History were reviewed in [employer] record.   Physical Exam: General: Well developed, well nourished, no acute distress Head: Normocephalic and atraumatic Eyes: Sclerae anicteric, EOMI Ears: Normal auditory acuity Mouth: No deformities or lesions noted Lungs: Clear throughout to auscultation Heart: Regular rate and rhythm; No murmurs, rubs or bruits Abdomen: Soft, non tender and non distended. No masses, hepatosplenomegaly or hernias noted. Normal Bowel sounds Rectal: Not done Musculoskeletal: Symmetrical with no gross deformities  Pulses:  Normal pulses noted Extremities: No edema or deformities noted Neurological: Alert oriented x 4, grossly nonfocal Psychological:  Alert and cooperative. Normal mood and affect   [name] T. Russella Dar, MD 10/08/2022, 11:10 AM

## 2022-10-09 ENCOUNTER — Ambulatory Visit: Payer: PPO

## 2022-10-11 ENCOUNTER — Ambulatory Visit (INDEPENDENT_AMBULATORY_CARE_PROVIDER_SITE_OTHER): Payer: PPO | Admitting: Family Medicine

## 2022-10-11 ENCOUNTER — Encounter: Payer: Self-pay | Admitting: Family Medicine

## 2022-10-11 VITALS — BP 130/80 | HR 71 | Temp 98.9°F | Ht 60.0 in | Wt 209.4 lb

## 2022-10-11 DIAGNOSIS — E119 Type 2 diabetes mellitus without complications: Secondary | ICD-10-CM | POA: Diagnosis not present

## 2022-10-11 DIAGNOSIS — J40 Bronchitis, not specified as acute or chronic: Secondary | ICD-10-CM | POA: Diagnosis not present

## 2022-10-11 DIAGNOSIS — R059 Cough, unspecified: Secondary | ICD-10-CM

## 2022-10-11 DIAGNOSIS — Z7984 Long term (current) use of oral hypoglycemic drugs: Secondary | ICD-10-CM | POA: Diagnosis not present

## 2022-10-11 DIAGNOSIS — I10 Essential (primary) hypertension: Secondary | ICD-10-CM

## 2022-10-11 LAB — POCT GLYCOSYLATED HEMOGLOBIN (HGB A1C): Hemoglobin A1C: 6.8 % — AB (ref 4.0–5.6)

## 2022-10-11 LAB — POC COVID19 BINAXNOW: SARS Coronavirus 2 Ag: NEGATIVE

## 2022-10-11 MED ORDER — DAPAGLIFLOZIN PROPANEDIOL 10 MG PO TABS: 10.0000 mg | ORAL_TABLET | Freq: Every day | ORAL | 5 refills | Status: DC

## 2022-10-11 MED ORDER — HYDROCODONE BIT-HOMATROP MBR 5-1.5 MG/5ML PO SOLN
5.0000 mL | ORAL | 0 refills | Status: DC | PRN
Start: 2022-10-11 — End: 2023-01-09

## 2022-10-11 MED ORDER — NEOMYCIN-POLYMYXIN-HC 3.5-10000-1 OT SOLN: 4.0000 [drp] | Freq: Four times a day (QID) | OTIC | 2 refills | Status: DC

## 2022-10-11 MED ORDER — LEVOFLOXACIN 500 MG PO TABS: 500.0000 mg | ORAL_TABLET | Freq: Every day | ORAL | 0 refills | Status: AC

## 2022-10-11 NOTE — Progress Notes (Signed)
   Subjective:    Patient ID: [name], female    DOB: 06-14-1940, 82 y.o.   MRN: [id number]  HPI Here with her son for 4 days of a deep dry cough. No SOB or fever. Using Robitussin with no relief. She also wants to discuss her diabetes. This has been fairly well controlled, and her A1c today is 6.8%. She had been taking Metformin, Januvia, and Glimiperide, but her nephrologist recently told her to stop taking Metformin.    Review of Systems  Constitutional: Negative.   HENT: Negative.    Eyes: Negative.   Respiratory:  Positive for cough, chest tightness and wheezing. Negative for shortness of breath.   Cardiovascular: Negative.        Objective:   Physical Exam Constitutional:      Appearance: Normal appearance. She is not ill-appearing.  Cardiovascular:     Rate and Rhythm: Normal rate and regular rhythm.     Pulses: Normal pulses.     Heart sounds: Normal heart sounds.  Pulmonary:     Effort: Pulmonary effort is normal.     Breath sounds: Wheezing and rhonchi present. No rales.  Neurological:     Mental Status: She is alert.           Assessment & Plan:  For the bronchitis, we will treat with 10 days of Levaquin. For the diabetes, we will stop Metformin and start her on Farxiga 10 mg daily. Recheck an A1c in 90 days.  Gershon Crane, MD

## 2022-10-12 ENCOUNTER — Other Ambulatory Visit: Payer: Self-pay | Admitting: Gastroenterology

## 2022-10-12 ENCOUNTER — Ambulatory Visit: Payer: PPO | Attending: Cardiovascular Disease | Admitting: *Deleted

## 2022-10-12 VITALS — BP 112/68 | HR 91 | Wt 209.6 lb

## 2022-10-12 DIAGNOSIS — R9431 Abnormal electrocardiogram [ECG] [EKG]: Secondary | ICD-10-CM | POA: Diagnosis not present

## 2022-10-12 NOTE — Progress Notes (Signed)
   Nurse Visit   Date of Encounter: 10/12/2022 ID: [name], DOB 01/02/1941, MRN [id number]  PCP:  Nelwyn Salisbury, MD   [facility] Providers Cardiologist:  Donato Schultz, MD      Visit Details   VS:  BP 112/68 (BP Location: Left Arm, Patient Position: Sitting, Cuff Size: Normal)   Pulse 91   Wt 209 lb 9.6 oz (95.1 kg)   LMP  (LMP Unknown)   BMI 40.93 kg/m  , BMI Body mass index is 40.93 kg/m.  Wt Readings from Last 3 Encounters:  10/12/22 209 lb 9.6 oz (95.1 kg)  10/11/22 209 lb 6.4 oz (95 kg)  10/08/22 192 lb (87.1 kg)     Reason for visit: recheck EKG for QT prolongation Performed today: Vitals, EKG, Provider consulted:Dr. [place] Emms (DOD), and Education Changes (medications, testing, etc.) : No changes, QT improved per DOD Length of Visit: 10 minutes  Pt diagnosed w bronchitis yesterday by PCP.  Prescribed Levaquin and Hycodan - reviewed these medicatins w DOD, no concerns for further QT prolongation.  Medications Adjustments/Labs and Tests Ordered: No orders of the defined types were placed in this encounter.  No orders of the defined types were placed in this encounter.    Jerald Kief, RN  10/12/2022 12:20 PM

## 2022-10-16 DIAGNOSIS — J9601 Acute respiratory failure with hypoxia: Secondary | ICD-10-CM | POA: Diagnosis not present

## 2022-10-16 DIAGNOSIS — J189 Pneumonia, unspecified organism: Secondary | ICD-10-CM | POA: Diagnosis not present

## 2022-10-16 DIAGNOSIS — I2609 Other pulmonary embolism with acute cor pulmonale: Secondary | ICD-10-CM | POA: Diagnosis not present

## 2022-10-17 DIAGNOSIS — M7581 Other shoulder lesions, right shoulder: Secondary | ICD-10-CM | POA: Diagnosis not present

## 2022-10-17 DIAGNOSIS — M7551 Bursitis of right shoulder: Secondary | ICD-10-CM | POA: Diagnosis not present

## 2022-10-18 ENCOUNTER — Telehealth: Payer: Self-pay | Admitting: Family Medicine

## 2022-10-18 NOTE — Telephone Encounter (Signed)
Pt son is calling and pt is somewhat better and has one abx left and per son md told him to call if another round of abx is needed. Please send abx levofloxacin (LEVAQUIN) 500 MG tablet  [id number] - [place], [place] - [address] Phone: [phone]  Fax: [phone]

## 2022-10-19 NOTE — Telephone Encounter (Signed)
Call in Levaquin 500 mg daily for 10 days  

## 2022-10-22 ENCOUNTER — Other Ambulatory Visit: Payer: Self-pay

## 2022-10-22 MED ORDER — LEVOFLOXACIN 500 MG PO TABS: 500.0000 mg | ORAL_TABLET | Freq: Every day | ORAL | 0 refills | Status: DC

## 2022-10-22 NOTE — Telephone Encounter (Signed)
Pt Rx sent pt son notified

## 2022-10-22 NOTE — Telephone Encounter (Signed)
Pt son is calling and he is aware abx will be sent to phar

## 2022-10-23 ENCOUNTER — Other Ambulatory Visit: Payer: Self-pay | Admitting: Family Medicine

## 2022-10-23 ENCOUNTER — Telehealth: Payer: Self-pay | Admitting: Family Medicine

## 2022-10-23 ENCOUNTER — Other Ambulatory Visit: Payer: Self-pay | Admitting: Cardiology

## 2022-10-23 NOTE — Telephone Encounter (Signed)
Asking if he can give the patient Mucinex DM if it would interact with her other medications

## 2022-10-24 ENCOUNTER — Other Ambulatory Visit: Payer: Self-pay | Admitting: Cardiology

## 2022-10-24 ENCOUNTER — Telehealth: Payer: Self-pay | Admitting: Gastroenterology

## 2022-10-24 NOTE — Telephone Encounter (Signed)
Cannot refill before 10/28/22. Request too early.

## 2022-10-24 NOTE — Telephone Encounter (Signed)
PT is calling to get refill on lomotil sent to [facility] [address]

## 2022-10-24 NOTE — Telephone Encounter (Signed)
Attempted to call  Danowski,William  But the voice mailbox is full.

## 2022-10-24 NOTE — Telephone Encounter (Signed)
Mucinex DM would be fine to give her

## 2022-10-24 NOTE — Telephone Encounter (Signed)
Husband is aware.

## 2022-10-29 MED ORDER — DIPHENOXYLATE-ATROPINE 2.5-0.025 MG PO TABS: ORAL_TABLET | ORAL | 1 refills | Status: DC

## 2022-10-29 NOTE — Telephone Encounter (Signed)
Prescription faxed to patient's pharmacy. 

## 2022-11-01 DIAGNOSIS — J984 Other disorders of lung: Secondary | ICD-10-CM | POA: Diagnosis not present

## 2022-11-07 ENCOUNTER — Encounter: Payer: Self-pay | Admitting: Family Medicine

## 2022-11-07 ENCOUNTER — Other Ambulatory Visit: Payer: Self-pay | Admitting: Cardiology

## 2022-11-07 ENCOUNTER — Telehealth (INDEPENDENT_AMBULATORY_CARE_PROVIDER_SITE_OTHER): Payer: PPO | Admitting: Family Medicine

## 2022-11-07 DIAGNOSIS — F1190 Opioid use, unspecified, uncomplicated: Secondary | ICD-10-CM | POA: Diagnosis not present

## 2022-11-07 DIAGNOSIS — G8929 Other chronic pain: Secondary | ICD-10-CM

## 2022-11-07 DIAGNOSIS — M5442 Lumbago with sciatica, left side: Secondary | ICD-10-CM

## 2022-11-07 DIAGNOSIS — M5441 Lumbago with sciatica, right side: Secondary | ICD-10-CM | POA: Diagnosis not present

## 2022-11-07 MED ORDER — HYDROCODONE-ACETAMINOPHEN 10-325 MG PO TABS: 1.0000 | ORAL_TABLET | Freq: Four times a day (QID) | ORAL | 0 refills | Status: DC | PRN

## 2022-11-07 MED ORDER — GABAPENTIN 300 MG PO CAPS: 300.0000 mg | ORAL_CAPSULE | Freq: Two times a day (BID) | ORAL | 11 refills | Status: DC

## 2022-11-07 NOTE — Progress Notes (Signed)
Subjective:    Patient ID: [name], female    DOB: Feb 17, 1941, 82 y.o.   MRN: [id number]  HPI Virtual Visit via Video Note  I connected with the patient on 11/07/22 at 11:00 AM EDT by a video enabled telemedicine application and verified that I am speaking with the correct person using two identifiers.  Location patient: home Location provider:work or home office Persons participating in the virtual visit: patient, provider  I discussed the limitations of evaluation and management by telemedicine and the availability of in person appointments. The patient expressed understanding and agreed to proceed.   HPI: Here for pain management. She is doing fairly well.    ROS: See pertinent positives and negatives per HPI.  Past Medical History:  Diagnosis Date   Adenomatous colon polyp 02/2006   Allergy    Allergy, unspecified not elsewhere classified    Anemia    Anxiety    Arthritis    Asthma    Blood transfusion without reported diagnosis    Bronchiectasis    CAD (coronary artery disease)    BMS to the LAD, 2002; cath 12/07/11 patent LAD stent and mild nonobstructive disease, EF 65%; Medical management   Cataract    removed both eyes   CHF (congestive heart failure) (HCC)    Diastolic   Chronic diastolic CHF (congestive heart failure) (HCC) 11/25/2011   Clotting disorder (HCC)    in legs per pt    Diabetes mellitus    Diverticulosis of colon    DJD (degenerative joint disease)    Fibromyalgia    GERD (gastroesophageal reflux disease)    H. pylori infection 05/12/2019   Hypercholesterolemia    Hypertension    Microscopic hematuria    Neoplasm of kidney    Obesity    Other diseases of lung, not elsewhere classified    Pinched vertebral nerve    Pulmonary nodule    Negative PET in 2010   Stress incontinence, female    Syncope    Ulcerative colitis, left sided (HCC) 2008   Hx of   UTI (lower urinary tract infection)    Vitamin D deficiency disease     Past  Surgical History:  Procedure Laterality Date   ABDOMINAL HYSTERECTOMY     CARDIAC CATHETERIZATION  11/2011   CATARACT EXTRACTION, BILATERAL  2020   CHOLECYSTECTOMY     COLONOSCOPY  01/09/2018   per Dr. Russella Dar, adenomatous polyps, repeat in 3 yrs   CORONARY STENT PLACEMENT  2002   BMS to the LAD   ESOPHAGOGASTRODUODENOSCOPY  12/2008   HAND SURGERY  01/2006   Right Hand Surgery by Dr. Amanda Pea   LEFT HEART CATHETERIZATION WITH CORONARY ANGIOGRAM N/A 12/07/2011   Procedure: LEFT HEART CATHETERIZATION WITH CORONARY ANGIOGRAM;  Surgeon: Peter M Swaziland, MD;  Location: [facility] CATH LAB;  Service: Cardiovascular;  Laterality: N/A;   RIGHT/LEFT HEART CATH AND CORONARY ANGIOGRAPHY N/A 08/07/2021   Procedure: RIGHT/LEFT HEART CATH AND CORONARY ANGIOGRAPHY;  Surgeon: Dolores Patty, MD;  Location: [facility] INVASIVE CV LAB;  Service: Cardiovascular;  Laterality: N/A;   UPPER GASTROINTESTINAL ENDOSCOPY      Family History  Problem Relation Age of Onset   Pneumonia Father    Heart attack Father    Colon polyps Father    Parkinsonism Mother    Diabetes Brother    Sarcoidosis Brother    Hypertension Sister    Diabetes Sister    Breast cancer Daughter    Colon cancer Neg Hx  Esophageal cancer Neg Hx    Rectal cancer Neg Hx    Stomach cancer Neg Hx      Current Outpatient Medications:    acetaminophen (TYLENOL) 500 MG tablet, Take 500 mg by mouth every 6 (six) hours as needed for headache (pain). , Disp: , Rfl:    albuterol (PROVENTIL) (2.5 MG/3ML) 0.083% nebulizer solution, Take 3 mLs (2.5 mg total) by nebulization every 4 (four) hours as needed for wheezing or shortness of breath., Disp: 75 mL, Rfl: 3   albuterol (VENTOLIN HFA) 108 (90 BASE) MCG/ACT inhaler, Inhale 2 puffs into the lungs every 4 (four) hours as needed for wheezing or shortness of breath., Disp: 18 each, Rfl: 11   ALPRAZolam (XANAX) 0.5 MG tablet, Take 1 tablet (0.5 mg total) by mouth 3 (three) times daily as needed., Disp: 90  tablet, Rfl: 5   amLODipine (NORVASC) 10 MG tablet, TAKE 1 TABLET BY MOUTH EVERY DAY, Disp: 90 tablet, Rfl: 3   atorvastatin (LIPITOR) 20 MG tablet, Take 1 tablet (20 mg total) by mouth daily., Disp: 90 tablet, Rfl: 3   azelastine (ASTELIN) 0.1 % nasal spray, Place 1 spray into both nostrils at bedtime., Disp: , Rfl:    Blood Glucose Monitoring Suppl (ONETOUCH VERIO FLEX SYSTEM) w/Device KIT, 1 each by Does not apply route daily at 12 noon., Disp: 1 kit, Rfl: 0   budesonide (ENTOCORT EC) 3 MG 24 hr capsule, Take 3 capsules (9 mg total) by mouth daily., Disp: 90 capsule, Rfl: 2   Cholecalciferol (VITAMIN D-3) 5000 UNITS TABS, Patient takes 1 tablet by mouth once a week on Mondays., Disp: , Rfl:    clopidogrel (PLAVIX) 75 MG tablet, Take 1 tablet (75 mg total) by mouth daily. Please call [phone] to schedule an overdue appointment with Dr. Donato Schultz for future refills. Thank you. 2nd attempt., Disp: 15 tablet, Rfl: 0   dapagliflozin propanediol (FARXIGA) 10 MG TABS tablet, Take 1 tablet (10 mg total) by mouth daily., Disp: 30 tablet, Rfl: 5   dicyclomine (BENTYL) 10 MG capsule, Take 1 capsule (10 mg total) by mouth 3 (three) times daily as needed for spasms., Disp: 90 capsule, Rfl: 3   diphenoxylate-atropine (LOMOTIL) 2.5-0.025 MG tablet, TAKE 1 TABLET BY MOUTH 2 TIMES DAILY AS NEEDED, Disp: 60 tablet, Rfl: 1   fluticasone (FLONASE) 50 MCG/ACT nasal spray, PLACE 2 SPRAYS IN EACH NOSTRIL AT BEDTIME, Disp: 16 g, Rfl: 6   gabapentin (NEURONTIN) 300 MG capsule, TAKE 1 CAPSULE BY MOUTH TWICE A DAY, Disp: 60 capsule, Rfl: 2   glimepiride (AMARYL) 1 MG tablet, TAKE 2 TABLETS (2 MG TOTAL) BY MOUTH DAILY WITH BREAKFAST, Disp: 180 tablet, Rfl: 0   HYDROcodone bit-homatropine (HYCODAN) 5-1.5 MG/5ML syrup, Take 5 mLs by mouth every 4 (four) hours as needed., Disp: 240 mL, Rfl: 0   hyoscyamine (LEVBID) 0.375 MG 12 hr tablet, TAKE 1 TABLET (0.375 MG TOTAL) BY MOUTH 2 (TWO) TIMES DAILY., Disp: 180 tablet, Rfl:  0   JANUVIA 100 MG tablet, TAKE 1 TABLET BY MOUTH EVERY DAY, Disp: 90 tablet, Rfl: 3   KLOR-CON M20 20 MEQ tablet, TAKE 1 TABLET BY MOUTH TWICE A DAY, Disp: 180 tablet, Rfl: 3   levocetirizine (XYZAL) 5 MG tablet, Take 5 mg by mouth every evening., Disp: , Rfl:    levofloxacin (LEVAQUIN) 500 MG tablet, Take 1 tablet (500 mg total) by mouth daily., Disp: 10 tablet, Rfl: 0   LIDODERM 5 %, APPLY 1 PATCH TO SKIN FOR 12 HOURS  THEN REMOVE AND DISCARD PATCH WITHIN 72 HOURS OR AS DIRECTED, Disp: 30 patch, Rfl: 2   loperamide (IMODIUM) 2 MG capsule, Take 4-6 mg by mouth daily. Taking as needed only, Disp: , Rfl:    losartan (COZAAR) 25 MG tablet, Take 1 tablet (25 mg total) by mouth daily., Disp: 90 tablet, Rfl: 3   magnesium oxide (MAG-OX) 400 MG tablet, Take 3 tablets (1,200 mg total) by mouth daily., Disp: 90 tablet, Rfl: 5   mesalamine (LIALDA) 1.2 g EC tablet, TAKE 2 TABLETS (2.4 G TOTAL) BY MOUTH 2 (TWO) TIMES DAILY. NEEDS OFFICE VISIT FOR FURTHER REFILLS, Disp: 360 tablet, Rfl: 0   metoprolol succinate (TOPROL XL) 100 MG 24 hr tablet, Take 1 tablet (100 mg total) by mouth daily. Take with or immediately following a meal., Disp: 90 tablet, Rfl: 1   montelukast (SINGULAIR) 10 MG tablet, Take one every morning, Disp: 90 tablet, Rfl: 3   neomycin-polymyxin-hydrocortisone (CORTISPORIN) OTIC solution, Place 4 drops into both ears 4 (four) times daily., Disp: 10 mL, Rfl: 2   nitroGLYCERIN (NITROSTAT) 0.4 MG SL tablet, PLACE 1 TABLET (0.4 MG TOTAL) UNDER THE TONGUE EVERY 5 (FIVE) MINUTES AS NEEDED FOR CHEST PAIN., Disp: 75 tablet, Rfl: 1   Olopatadine HCl 0.7 % SOLN, Place 1 drop into both eyes daily as needed (itching/allergies)., Disp: , Rfl:    OneTouch Delica Lancets 33G MISC, 1 each by Does not apply route daily at 12 noon., Disp: 100 each, Rfl: 0   ONETOUCH VERIO test strip, USE TO CHECK BLOOD GLUCOSE ONCE DAILY, Disp: 100 strip, Rfl: 1   Pancrelipase, Lip-Prot-Amyl, (ZENPEP) 20000-63000 units CPEP,  TAKE 2 CAPSULES (40,000 UNITS) WITH MEALS AND 1 CAPSULE (20,000 UNITS) WITH A SNACK, Disp: 240 capsule, Rfl: 1   Pancrelipase, Lip-Prot-Amyl, (ZENPEP) 40000-126000 units CPEP, Take by mouth. Take 2 tablets by mouth before meals and 1 tablet before snacks, Disp: , Rfl:    pantoprazole (PROTONIX) 40 MG tablet, TAKE 1 TABLET (40 MG TOTAL) BY MOUTH 2 (TWO) TIMES DAILY. NEEDS OFFICE VISIT FOR FURTHER REFILLS, Disp: 180 tablet, Rfl: 0   Polyethyl Glycol-Propyl Glycol (SYSTANE OP), Place 1 drop into both eyes daily. For dry eyes, Disp: , Rfl:    TOBRADEX ophthalmic solution, Place 1 drop into both eyes 4 (four) times daily. As needed, Disp: , Rfl:    torsemide (DEMADEX) 20 MG tablet, TAKE 2 TABLETS BY MOUTH EVERY DAY, Disp: 180 tablet, Rfl: 0  Current Facility-Administered Medications:    ipratropium-albuterol (DUONEB) 0.5-2.5 (3) MG/3ML nebulizer solution 3 mL, 3 mL, Nebulization, Once, Nafziger, Kandee Keen, NP  EXAM:  VITALS per patient if applicable:  GENERAL: alert, oriented, appears well and in no acute distress  HEENT: atraumatic, conjunttiva clear, no obvious abnormalities on inspection of external nose and ears  NECK: normal movements of the head and neck  LUNGS: on inspection no signs of respiratory distress, breathing rate appears normal, no obvious gross SOB, gasping or wheezing  CV: no obvious cyanosis  MS: moves all visible extremities without noticeable abnormality  PSYCH/NEURO: pleasant and cooperative, no obvious depression or anxiety, speech and thought processing grossly intact  ASSESSMENT AND PLAN: Pain management. Indication for chronic opioid: low back pain Medication and dose: Norco 10-325 and Gabapentin 300 mg  # pills per month: 120 and 60 Last UDS date: 06-29-22 Opioid Treatment Agreement signed (Y/N): 10-28-18 Opioid Treatment Agreement last reviewed with patient:  11-07-22 [facility] reviewed this encounter (include red flags): Yes Meds were refilled.  Gershon Crane,  MD  Discussed the following assessment and plan:  No diagnosis found.     I discussed the assessment and treatment plan with the patient. The patient was provided an opportunity to ask questions and all were answered. The patient agreed with the plan and demonstrated an understanding of the instructions.   The patient was advised to call back or seek an in-person evaluation if the symptoms worsen or if the condition fails to improve as anticipated.      Review of Systems     Objective:   Physical Exam        Assessment & Plan:

## 2022-11-15 ENCOUNTER — Other Ambulatory Visit: Payer: Self-pay | Admitting: Gastroenterology

## 2022-11-15 DIAGNOSIS — I2609 Other pulmonary embolism with acute cor pulmonale: Secondary | ICD-10-CM | POA: Diagnosis not present

## 2022-11-15 DIAGNOSIS — J9601 Acute respiratory failure with hypoxia: Secondary | ICD-10-CM | POA: Diagnosis not present

## 2022-11-15 DIAGNOSIS — J189 Pneumonia, unspecified organism: Secondary | ICD-10-CM | POA: Diagnosis not present

## 2022-11-29 ENCOUNTER — Telehealth: Payer: Self-pay | Admitting: Gastroenterology

## 2022-11-29 MED ORDER — DIPHENOXYLATE-ATROPINE 2.5-0.025 MG PO TABS: ORAL_TABLET | ORAL | 1 refills | Status: DC

## 2022-11-29 NOTE — Telephone Encounter (Signed)
Prescription faxed to patient's pharmacy. 

## 2022-11-29 NOTE — Telephone Encounter (Signed)
Medication refill on lomotil. Please advise.  Thank you

## 2022-12-02 DIAGNOSIS — J984 Other disorders of lung: Secondary | ICD-10-CM | POA: Diagnosis not present

## 2022-12-11 ENCOUNTER — Ambulatory Visit: Payer: PPO | Admitting: Podiatry

## 2022-12-11 DIAGNOSIS — B353 Tinea pedis: Secondary | ICD-10-CM

## 2022-12-11 DIAGNOSIS — B351 Tinea unguium: Secondary | ICD-10-CM

## 2022-12-11 DIAGNOSIS — M79674 Pain in right toe(s): Secondary | ICD-10-CM

## 2022-12-11 DIAGNOSIS — M79675 Pain in left toe(s): Secondary | ICD-10-CM

## 2022-12-11 MED ORDER — CLOTRIMAZOLE-BETAMETHASONE 1-0.05 % EX CREA
1.0000 | TOPICAL_CREAM | Freq: Every day | CUTANEOUS | 3 refills | Status: DC
Start: 2022-12-11 — End: 2023-03-25

## 2022-12-11 NOTE — Progress Notes (Signed)
Subjective:  Patient ID: [name], female    DOB: [birth year],  MRN: [id number]   [name] presents to clinic today for:  Chief Complaint  Patient presents with   Diabetes    [facility] BS - 119  A1C - 6   Rash    RASH BILAT CAME UP ABOUT 2 MONTHS AGO IT ITCHES   Patient notes nails are thick, discolored, elongated and painful in shoegear when trying to ambulate.  She also has an itchy rash on the bottom of both feet extending up the outer portion of the feet and ankles.  She states it has been spreading since she has been scratching it.  She has not tried anything at home for this previously  PCP is Nelwyn Salisbury, MD. date last seen was 10/11/2022  Allergies  Allergen Reactions   Doxycycline Other (See Comments)    Unsteady gait   Aspirin Nausea And Vomiting   Azithromycin Other (See Comments)    REACTION: pt states "ZPak doesn't work"   Caffeine Nausea And Vomiting   Ciprofloxacin Nausea And Vomiting    Tolerates levaquin   Codeine Nausea And Vomiting   Oxycodone Other (See Comments)    Pt stated, "upsets my stomach" (11/19/17)   Sulfamethizole Other (See Comments)    Unknown reaction   Tramadol Hcl Other (See Comments)    REACTION: pt states "spaced-out"    Review of Systems: Negative except as noted in the HPI.  Objective:  There were no vitals filed for this visit.  [name] is a pleasant 82 y.o. female in NAD. AAO x 3.  Vascular Examination: Capillary refill time is 3-5 seconds to toes bilateral. Palpable pedal pulses b/l LE. Digital hair present b/l.  +1 pitting edema b/l ankles and lower legs. Skin temperature gradient WNL b/l.  No cyanosis or clubbing noted b/l.   Dermatological Examination: Pedal skin with normal turgor, texture and tone b/l. No open wounds. No interdigital macerations b/l. Toenails x10 are 3mm thick, discolored, dystrophic with subungual debris. There is pain with compression of the nail plates.  They are elongated  x10  Neurological Examination: Epicritic sensation intact bilateral LE.   Musculoskeletal Examination: Muscle strength 5/5 to all LE muscle groups b/l.      Latest Ref Rng & Units 10/11/2022   10:59 AM  Hemoglobin A1C  Hemoglobin-A1c 4.0 - 5.6 % 6.8     Assessment/Plan: 1. Tinea pedis of both feet   2. Pain due to onychomycosis of toenails of both feet     Meds ordered this encounter  Medications   clotrimazole-betamethasone (LOTRISONE) cream    Sig: Apply 1 Application topically daily. Apply twice daily to affected areas of feet and ankles for 4 weeks    Dispense:  45 g    Refill:  3   The mycotic toenails were sharply debrided x10 with sterile nail nippers and a power debriding burr to decrease bulk/thickness and length.    Prescription for topical Lotrisone was sent to her pharmacy.  She is advised on how to apply this.  She will continue applying it for the next 3 to 4 weeks.  She will call if the skin fungus begins to spread superiorly up the leg further or does not resolve at all with the current prescription.  Return in about 3 months (around 03/13/2023) for [facility].   Clerance Lav, DPM, FACFAS [facility]     [address]  [place], [place] [address]                Office [phone]  Fax [phone]

## 2022-12-14 ENCOUNTER — Other Ambulatory Visit: Payer: Self-pay | Admitting: Family Medicine

## 2022-12-16 ENCOUNTER — Other Ambulatory Visit: Payer: Self-pay | Admitting: Gastroenterology

## 2022-12-16 DIAGNOSIS — J9601 Acute respiratory failure with hypoxia: Secondary | ICD-10-CM | POA: Diagnosis not present

## 2022-12-16 DIAGNOSIS — J189 Pneumonia, unspecified organism: Secondary | ICD-10-CM | POA: Diagnosis not present

## 2022-12-16 DIAGNOSIS — I2609 Other pulmonary embolism with acute cor pulmonale: Secondary | ICD-10-CM | POA: Diagnosis not present

## 2022-12-20 ENCOUNTER — Other Ambulatory Visit: Payer: Self-pay

## 2022-12-20 MED ORDER — NITROGLYCERIN 0.4 MG SL SUBL: 0.4000 mg | SUBLINGUAL_TABLET | SUBLINGUAL | 2 refills | Status: DC | PRN

## 2022-12-25 ENCOUNTER — Telehealth: Payer: Self-pay | Admitting: Gastroenterology

## 2022-12-25 DIAGNOSIS — M545 Low back pain, unspecified: Secondary | ICD-10-CM | POA: Diagnosis not present

## 2022-12-25 DIAGNOSIS — M7551 Bursitis of right shoulder: Secondary | ICD-10-CM | POA: Diagnosis not present

## 2022-12-25 NOTE — Telephone Encounter (Signed)
Informed patient and patient's son I sent in a refill on 11/29/22 with one additional refill. Informed them if they cannot get it refilled to let me know and I will send a new prescription. They both verbalized understanding.

## 2022-12-25 NOTE — Telephone Encounter (Signed)
Patient called to request refill on Lomotil

## 2022-12-27 ENCOUNTER — Other Ambulatory Visit: Payer: Self-pay | Admitting: Cardiology

## 2023-01-02 DIAGNOSIS — J984 Other disorders of lung: Secondary | ICD-10-CM | POA: Diagnosis not present

## 2023-01-09 ENCOUNTER — Other Ambulatory Visit: Payer: Self-pay | Admitting: Cardiology

## 2023-01-09 ENCOUNTER — Other Ambulatory Visit: Payer: Self-pay | Admitting: Gastroenterology

## 2023-01-09 ENCOUNTER — Encounter: Payer: Self-pay | Admitting: Family Medicine

## 2023-01-09 ENCOUNTER — Telehealth (INDEPENDENT_AMBULATORY_CARE_PROVIDER_SITE_OTHER): Payer: PPO | Admitting: Family Medicine

## 2023-01-09 DIAGNOSIS — J40 Bronchitis, not specified as acute or chronic: Secondary | ICD-10-CM | POA: Diagnosis not present

## 2023-01-09 MED ORDER — HYDROCODONE BIT-HOMATROP MBR 5-1.5 MG/5ML PO SOLN: 5.0000 mL | ORAL | 0 refills | Status: DC | PRN

## 2023-01-09 MED ORDER — LEVOFLOXACIN 500 MG PO TABS: 500.0000 mg | ORAL_TABLET | Freq: Every day | ORAL | 0 refills | Status: DC

## 2023-01-09 NOTE — Progress Notes (Signed)
Subjective:    Patient ID: [name], female    DOB: 11/10/1940, 82 y.o.   MRN: [id number]  HPI Virtual Visit via Video Note  I connected with the patient on 01/09/23 at 11:00 AM EDT by a video enabled telemedicine application and verified that I am speaking with the correct person using two identifiers.  Location patient: home Location provider:work or home office Persons participating in the virtual visit: patient, provider  I discussed the limitations of evaluation and management by telemedicine and the availability of in person appointments. The patient expressed understanding and agreed to proceed.   HPI: Here for 3 days of PND, ST, and coughing up yellow sputum. No fever or SOB. Using Tylenol and Mucinex.    ROS: See pertinent positives and negatives per HPI.  Past Medical History:  Diagnosis Date   Adenomatous colon polyp 02/2006   Allergy    Allergy, unspecified not elsewhere classified    Anemia    Anxiety    Arthritis    Asthma    Blood transfusion without reported diagnosis    Bronchiectasis    CAD (coronary artery disease)    BMS to the LAD, 2002; cath 12/07/11 patent LAD stent and mild nonobstructive disease, EF 65%; Medical management   Cataract    removed both eyes   CHF (congestive heart failure) (HCC)    Diastolic   Chronic diastolic CHF (congestive heart failure) (HCC) 11/25/2011   Clotting disorder (HCC)    in legs per pt    Diabetes mellitus    Diverticulosis of colon    DJD (degenerative joint disease)    Fibromyalgia    GERD (gastroesophageal reflux disease)    H. pylori infection 05/12/2019   Hypercholesterolemia    Hypertension    Microscopic hematuria    Neoplasm of kidney    Obesity    Other diseases of lung, not elsewhere classified    Pinched vertebral nerve    Pulmonary nodule    Negative PET in 2010   Stress incontinence, female    Syncope    Ulcerative colitis, left sided (HCC) 2008   Hx of   UTI (lower urinary tract  infection)    Vitamin D deficiency disease     Past Surgical History:  Procedure Laterality Date   ABDOMINAL HYSTERECTOMY     CARDIAC CATHETERIZATION  11/2011   CATARACT EXTRACTION, BILATERAL  2020   CHOLECYSTECTOMY     COLONOSCOPY  01/09/2018   per Dr. Russella Dar, adenomatous polyps, repeat in 3 yrs   CORONARY STENT PLACEMENT  2002   BMS to the LAD   ESOPHAGOGASTRODUODENOSCOPY  12/2008   HAND SURGERY  01/2006   Right Hand Surgery by Dr. Amanda Pea   LEFT HEART CATHETERIZATION WITH CORONARY ANGIOGRAM N/A 12/07/2011   Procedure: LEFT HEART CATHETERIZATION WITH CORONARY ANGIOGRAM;  Surgeon: Peter M Swaziland, MD;  Location: [facility] CATH LAB;  Service: Cardiovascular;  Laterality: N/A;   RIGHT/LEFT HEART CATH AND CORONARY ANGIOGRAPHY N/A 08/07/2021   Procedure: RIGHT/LEFT HEART CATH AND CORONARY ANGIOGRAPHY;  Surgeon: Dolores Patty, MD;  Location: [facility] INVASIVE CV LAB;  Service: Cardiovascular;  Laterality: N/A;   UPPER GASTROINTESTINAL ENDOSCOPY      Family History  Problem Relation Age of Onset   Pneumonia Father    Heart attack Father    Colon polyps Father    Parkinsonism Mother    Diabetes Brother    Sarcoidosis Brother    Hypertension Sister    Diabetes Sister    Breast  cancer Daughter    Colon cancer Neg Hx    Esophageal cancer Neg Hx    Rectal cancer Neg Hx    Stomach cancer Neg Hx      Current Outpatient Medications:    acetaminophen (TYLENOL) 500 MG tablet, Take 500 mg by mouth every 6 (six) hours as needed for headache (pain). , Disp: , Rfl:    albuterol (PROVENTIL) (2.5 MG/3ML) 0.083% nebulizer solution, Take 3 mLs (2.5 mg total) by nebulization every 4 (four) hours as needed for wheezing or shortness of breath., Disp: 75 mL, Rfl: 3   albuterol (VENTOLIN HFA) 108 (90 BASE) MCG/ACT inhaler, Inhale 2 puffs into the lungs every 4 (four) hours as needed for wheezing or shortness of breath., Disp: 18 each, Rfl: 11   ALPRAZolam (XANAX) 0.5 MG tablet, Take 1 tablet (0.5 mg total) by  mouth 3 (three) times daily as needed., Disp: 90 tablet, Rfl: 5   amLODipine (NORVASC) 10 MG tablet, TAKE 1 TABLET BY MOUTH EVERY DAY, Disp: 90 tablet, Rfl: 3   atorvastatin (LIPITOR) 20 MG tablet, Take 1 tablet (20 mg total) by mouth daily., Disp: 90 tablet, Rfl: 3   azelastine (ASTELIN) 0.1 % nasal spray, Place 1 spray into both nostrils at bedtime., Disp: , Rfl:    Blood Glucose Monitoring Suppl (ONETOUCH VERIO FLEX SYSTEM) w/Device KIT, 1 each by Does not apply route daily at 12 noon., Disp: 1 kit, Rfl: 0   budesonide (ENTOCORT EC) 3 MG 24 hr capsule, Take 3 capsules (9 mg total) by mouth daily. Please take for one more month then discontinue., Disp: 90 capsule, Rfl: 0   Cholecalciferol (VITAMIN D-3) 5000 UNITS TABS, Patient takes 1 tablet by mouth once a week on Mondays., Disp: , Rfl:    clopidogrel (PLAVIX) 75 MG tablet, TAKE 1 TABLET BY MOUTH DAILY. PLEASE CALL [phone] TO SCHEDULE AN OVERDUE APPOINTMENT WITH DR. MARK SKAINS FOR FUTURE REFILLS. THANK YOU. 2ND ATTEMPT., Disp: 90 tablet, Rfl: 3   clotrimazole-betamethasone (LOTRISONE) cream, Apply 1 Application topically daily. Apply twice daily to affected areas of feet and ankles for 4 weeks, Disp: 45 g, Rfl: 3   dapagliflozin propanediol (FARXIGA) 10 MG TABS tablet, Take 1 tablet (10 mg total) by mouth daily., Disp: 30 tablet, Rfl: 5   dicyclomine (BENTYL) 10 MG capsule, Take 1 capsule (10 mg total) by mouth 3 (three) times daily as needed for spasms., Disp: 90 capsule, Rfl: 3   diphenoxylate-atropine (LOMOTIL) 2.5-0.025 MG tablet, TAKE 1 TABLET BY MOUTH 2 TIMES DAILY AS NEEDED, Disp: 60 tablet, Rfl: 1   fluticasone (FLONASE) 50 MCG/ACT nasal spray, PLACE 2 SPRAYS IN EACH NOSTRIL AT BEDTIME, Disp: 16 g, Rfl: 6   gabapentin (NEURONTIN) 300 MG capsule, Take 1 capsule (300 mg total) by mouth 2 (two) times daily., Disp: 60 capsule, Rfl: 11   glimepiride (AMARYL) 1 MG tablet, TAKE 2 TABLETS (2 MG TOTAL) BY MOUTH DAILY WITH BREAKFAST, Disp: 180  tablet, Rfl: 0   HYDROcodone bit-homatropine (HYCODAN) 5-1.5 MG/5ML syrup, Take 5 mLs by mouth every 4 (four) hours as needed., Disp: 240 mL, Rfl: 0   HYDROcodone-acetaminophen (NORCO) 10-325 MG tablet, Take 1 tablet by mouth every 6 (six) hours as needed for moderate pain., Disp: 120 tablet, Rfl: 0   HYDROcodone-acetaminophen (NORCO) 10-325 MG tablet, Take 1 tablet by mouth every 6 (six) hours as needed for moderate pain., Disp: 120 tablet, Rfl: 0   HYDROcodone-acetaminophen (NORCO) 10-325 MG tablet, Take 1 tablet by mouth every 6 (six)  hours as needed for moderate pain., Disp: 120 tablet, Rfl: 0   hyoscyamine (LEVBID) 0.375 MG 12 hr tablet, TAKE 1 TABLET (0.375 MG TOTAL) BY MOUTH 2 (TWO) TIMES DAILY., Disp: 180 tablet, Rfl: 0   JANUVIA 100 MG tablet, TAKE 1 TABLET BY MOUTH EVERY DAY, Disp: 90 tablet, Rfl: 3   KLOR-CON M20 20 MEQ tablet, TAKE 1 TABLET BY MOUTH TWICE A DAY, Disp: 180 tablet, Rfl: 3   levocetirizine (XYZAL) 5 MG tablet, Take 5 mg by mouth every evening., Disp: , Rfl:    levofloxacin (LEVAQUIN) 500 MG tablet, Take 1 tablet (500 mg total) by mouth daily., Disp: 10 tablet, Rfl: 0   LIDODERM 5 %, APPLY 1 PATCH TO SKIN FOR 12 HOURS THEN REMOVE AND DISCARD PATCH WITHIN 72 HOURS OR AS DIRECTED, Disp: 30 patch, Rfl: 2   loperamide (IMODIUM) 2 MG capsule, Take 4-6 mg by mouth daily. Taking as needed only, Disp: , Rfl:    losartan (COZAAR) 25 MG tablet, Take 1 tablet (25 mg total) by mouth daily., Disp: 90 tablet, Rfl: 3   magnesium oxide (MAG-OX) 400 MG tablet, Take 3 tablets (1,200 mg total) by mouth daily., Disp: 90 tablet, Rfl: 5   mesalamine (LIALDA) 1.2 g EC tablet, TAKE 2 TABLETS (2.4 G TOTAL) BY MOUTH 2 (TWO) TIMES DAILY. NEEDS OFFICE VISIT FOR FURTHER REFILLS, Disp: 360 tablet, Rfl: 0   metoprolol succinate (TOPROL XL) 100 MG 24 hr tablet, Take 1 tablet (100 mg total) by mouth daily. Take with or immediately following a meal., Disp: 90 tablet, Rfl: 1   montelukast (SINGULAIR) 10 MG  tablet, Take one every morning, Disp: 90 tablet, Rfl: 3   neomycin-polymyxin-hydrocortisone (CORTISPORIN) OTIC solution, Place 4 drops into both ears 4 (four) times daily., Disp: 10 mL, Rfl: 2   nitroGLYCERIN (NITROSTAT) 0.4 MG SL tablet, PLACE 1 TABLET UNDER THE TONGUE EVERY 5 MINUTES AS NEEDED FOR CHEST PAIN., Disp: 75 tablet, Rfl: 2   Olopatadine HCl 0.7 % SOLN, Place 1 drop into both eyes daily as needed (itching/allergies)., Disp: , Rfl:    OneTouch Delica Lancets 33G MISC, 1 each by Does not apply route daily at 12 noon., Disp: 100 each, Rfl: 0   ONETOUCH VERIO test strip, USE TO CHECK BLOOD GLUCOSE ONCE DAILY, Disp: 100 strip, Rfl: 1   Pancrelipase, Lip-Prot-Amyl, (ZENPEP) 40000-126000 units CPEP, Take by mouth. Take 2 tablets by mouth before meals and 1 tablet before snacks, Disp: , Rfl:    pantoprazole (PROTONIX) 40 MG tablet, TAKE 1 TABLET (40 MG TOTAL) BY MOUTH 2 (TWO) TIMES DAILY. NEEDS OFFICE VISIT FOR FURTHER REFILLS, Disp: 180 tablet, Rfl: 0   Polyethyl Glycol-Propyl Glycol (SYSTANE OP), Place 1 drop into both eyes daily. For dry eyes, Disp: , Rfl:    TOBRADEX ophthalmic solution, Place 1 drop into both eyes 4 (four) times daily. As needed, Disp: , Rfl:    torsemide (DEMADEX) 20 MG tablet, TAKE 2 TABLETS BY MOUTH EVERY DAY, Disp: 180 tablet, Rfl: 0   ZENPEP 20000-63000 units CPEP, TAKE 2 CAPSULES (40,000 UNITS) WITH MEALS AND 1 CAPSULE (20,000 UNITS) WITH A SNACK, Disp: 240 capsule, Rfl: 1  Current Facility-Administered Medications:    ipratropium-albuterol (DUONEB) 0.5-2.5 (3) MG/3ML nebulizer solution 3 mL, 3 mL, Nebulization, Once, Nafziger, Kandee Keen, NP  EXAM:  VITALS per patient if applicable:  GENERAL: alert, oriented, appears well and in no acute distress  HEENT: atraumatic, conjunttiva clear, no obvious abnormalities on inspection of external nose and ears  NECK:  normal movements of the head and neck  LUNGS: on inspection no signs of respiratory distress, breathing rate  appears normal, no obvious gross SOB, gasping or wheezing  CV: no obvious cyanosis  MS: moves all visible extremities without noticeable abnormality  PSYCH/NEURO: pleasant and cooperative, no obvious depression or anxiety, speech and thought processing grossly intact  ASSESSMENT AND PLAN: Bronchitis, treat with 10 days of Levaquin. Gershon Crane, MD  Discussed the following assessment and plan:  No diagnosis found.     I discussed the assessment and treatment plan with the patient. The patient was provided an opportunity to ask questions and all were answered. The patient agreed with the plan and demonstrated an understanding of the instructions.   The patient was advised to call back or seek an in-person evaluation if the symptoms worsen or if the condition fails to improve as anticipated.      Review of Systems     Objective:   Physical Exam        Assessment & Plan:

## 2023-01-10 ENCOUNTER — Other Ambulatory Visit: Payer: Self-pay | Admitting: Gastroenterology

## 2023-01-13 ENCOUNTER — Other Ambulatory Visit: Payer: Self-pay | Admitting: Gastroenterology

## 2023-01-16 DIAGNOSIS — J189 Pneumonia, unspecified organism: Secondary | ICD-10-CM | POA: Diagnosis not present

## 2023-01-16 DIAGNOSIS — J9601 Acute respiratory failure with hypoxia: Secondary | ICD-10-CM | POA: Diagnosis not present

## 2023-01-16 DIAGNOSIS — I2609 Other pulmonary embolism with acute cor pulmonale: Secondary | ICD-10-CM | POA: Diagnosis not present

## 2023-01-20 ENCOUNTER — Other Ambulatory Visit: Payer: Self-pay | Admitting: Family Medicine

## 2023-02-01 DIAGNOSIS — J984 Other disorders of lung: Secondary | ICD-10-CM | POA: Diagnosis not present

## 2023-02-02 ENCOUNTER — Other Ambulatory Visit: Payer: Self-pay | Admitting: Family Medicine

## 2023-02-08 ENCOUNTER — Other Ambulatory Visit: Payer: Self-pay | Admitting: Gastroenterology

## 2023-02-15 DIAGNOSIS — J189 Pneumonia, unspecified organism: Secondary | ICD-10-CM | POA: Diagnosis not present

## 2023-02-15 DIAGNOSIS — I2609 Other pulmonary embolism with acute cor pulmonale: Secondary | ICD-10-CM | POA: Diagnosis not present

## 2023-02-15 DIAGNOSIS — J9601 Acute respiratory failure with hypoxia: Secondary | ICD-10-CM | POA: Diagnosis not present

## 2023-02-18 ENCOUNTER — Telehealth: Payer: Self-pay | Admitting: Gastroenterology

## 2023-02-18 NOTE — Telephone Encounter (Signed)
Generally we discontinue budesonide after a 57-month course as it often resolves an ulcerative colitis flare allowing medication such as Lialda to maintain control of ulcerative colitis.  Lialda 2.4 g twice daily will be her maintenance medication for ulcerative colitis after completing budesonide.  We can provide another 1 month course of budesonide.  I recommend she sees Dr. Maren Beach following my retirement.

## 2023-02-18 NOTE — Telephone Encounter (Signed)
Patients son called stating he is about to pick up the patients last script for Budesonide and is requesting a refill.

## 2023-02-18 NOTE — Telephone Encounter (Signed)
Dr Russella Dar, I returned sons call, he is fixing to pick up the last dose refill of the Budesonide and he is concerned that his mother has done so well on the 9 mg Budesonide he doesn't want her to stop taking it because this will be the last month she is suppose to take it. Dr Russella Dar, he wants to know what his mother is suppose to do when she completes the budesonide.  He is scared she will get sick again without it, he really wants to know can she continue it. He said he would really like her to have an appointment with you before you retire but I told him that you are extremely booked up. She has Lomotil she can take but it really doesn't help per the son. Please advise

## 2023-02-18 NOTE — Telephone Encounter (Signed)
Spoke with patients son about Dr Ardell Isaacs recommendations he still has one refill of the Budesonide he is going to hold on to in case his mom goes into a flare because he said she really doesn't need it now but will have her take the Lialda as prescribed. So I will not be sending in another rx of Budesonide since it is not needed. Patient will call back as needed also will call back in a few weeks to schedule a follow up with Dr Maren Beach

## 2023-02-25 ENCOUNTER — Other Ambulatory Visit: Payer: Self-pay | Admitting: Gastroenterology

## 2023-02-25 DIAGNOSIS — J4540 Moderate persistent asthma, uncomplicated: Secondary | ICD-10-CM | POA: Diagnosis not present

## 2023-02-25 DIAGNOSIS — K219 Gastro-esophageal reflux disease without esophagitis: Secondary | ICD-10-CM | POA: Diagnosis not present

## 2023-02-25 DIAGNOSIS — J309 Allergic rhinitis, unspecified: Secondary | ICD-10-CM | POA: Diagnosis not present

## 2023-03-04 DIAGNOSIS — J984 Other disorders of lung: Secondary | ICD-10-CM | POA: Diagnosis not present

## 2023-03-05 ENCOUNTER — Other Ambulatory Visit: Payer: Self-pay | Admitting: Gastroenterology

## 2023-03-06 ENCOUNTER — Encounter: Payer: Self-pay | Admitting: Family Medicine

## 2023-03-06 ENCOUNTER — Telehealth: Payer: PPO | Admitting: Family Medicine

## 2023-03-06 DIAGNOSIS — F1190 Opioid use, unspecified, uncomplicated: Secondary | ICD-10-CM

## 2023-03-06 DIAGNOSIS — M5442 Lumbago with sciatica, left side: Secondary | ICD-10-CM

## 2023-03-06 DIAGNOSIS — M5441 Lumbago with sciatica, right side: Secondary | ICD-10-CM | POA: Diagnosis not present

## 2023-03-06 DIAGNOSIS — G8929 Other chronic pain: Secondary | ICD-10-CM

## 2023-03-06 MED ORDER — HYDROCODONE-ACETAMINOPHEN 10-325 MG PO TABS: 1.0000 | ORAL_TABLET | Freq: Four times a day (QID) | ORAL | 0 refills | Status: DC | PRN

## 2023-03-06 MED ORDER — HYDROCODONE-ACETAMINOPHEN 10-325 MG PO TABS
1.0000 | ORAL_TABLET | Freq: Four times a day (QID) | ORAL | 0 refills | Status: DC | PRN
Start: 2023-03-06 — End: 2023-07-02

## 2023-03-06 NOTE — Progress Notes (Signed)
Subjective:    Patient ID: [name], female    DOB: Feb 08, 1941, 82 y.o.   MRN: [id number]  HPI Virtual Visit via Video Note  I connected with the patient on 03/06/23 at  1:30 PM EST by a video enabled telemedicine application and verified that I am speaking with the correct person using two identifiers.  Location patient: home Location provider:work or home office Persons participating in the virtual visit: patient, provider  I discussed the limitations of evaluation and management by telemedicine and the availability of in person appointments. The patient expressed understanding and agreed to proceed.   HPI: Here for pain management. She is doing well.    ROS: See pertinent positives and negatives per HPI.  Past Medical History:  Diagnosis Date   Adenomatous colon polyp 02/2006   Allergy    Allergy, unspecified not elsewhere classified    Anemia    Anxiety    Arthritis    Asthma    Blood transfusion without reported diagnosis    Bronchiectasis    CAD (coronary artery disease)    BMS to the LAD, 2002; cath 12/07/11 patent LAD stent and mild nonobstructive disease, EF 65%; Medical management   Cataract    removed both eyes   CHF (congestive heart failure) (HCC)    Diastolic   Chronic diastolic CHF (congestive heart failure) (HCC) 11/25/2011   Clotting disorder (HCC)    in legs per pt    Diabetes mellitus    Diverticulosis of colon    DJD (degenerative joint disease)    Fibromyalgia    GERD (gastroesophageal reflux disease)    H. pylori infection 05/12/2019   Hypercholesterolemia    Hypertension    Microscopic hematuria    Neoplasm of kidney    Obesity    Other diseases of lung, not elsewhere classified    Pinched vertebral nerve    Pulmonary nodule    Negative PET in 2010   Stress incontinence, female    Syncope    Ulcerative colitis, left sided (HCC) 2008   Hx of   UTI (lower urinary tract infection)    Vitamin D deficiency disease     Past Surgical  History:  Procedure Laterality Date   ABDOMINAL HYSTERECTOMY     CARDIAC CATHETERIZATION  11/2011   CATARACT EXTRACTION, BILATERAL  2020   CHOLECYSTECTOMY     COLONOSCOPY  01/09/2018   per Dr. Russella Dar, adenomatous polyps, repeat in 3 yrs   CORONARY STENT PLACEMENT  2002   BMS to the LAD   ESOPHAGOGASTRODUODENOSCOPY  12/2008   HAND SURGERY  01/2006   Right Hand Surgery by Dr. Amanda Pea   LEFT HEART CATHETERIZATION WITH CORONARY ANGIOGRAM N/A 12/07/2011   Procedure: LEFT HEART CATHETERIZATION WITH CORONARY ANGIOGRAM;  Surgeon: Peter M Swaziland, MD;  Location: [facility] CATH LAB;  Service: Cardiovascular;  Laterality: N/A;   RIGHT/LEFT HEART CATH AND CORONARY ANGIOGRAPHY N/A 08/07/2021   Procedure: RIGHT/LEFT HEART CATH AND CORONARY ANGIOGRAPHY;  Surgeon: Dolores Patty, MD;  Location: [facility] INVASIVE CV LAB;  Service: Cardiovascular;  Laterality: N/A;   UPPER GASTROINTESTINAL ENDOSCOPY      Family History  Problem Relation Age of Onset   Pneumonia Father    Heart attack Father    Colon polyps Father    Parkinsonism Mother    Diabetes Brother    Sarcoidosis Brother    Hypertension Sister    Diabetes Sister    Breast cancer Daughter    Colon cancer Neg Hx  Esophageal cancer Neg Hx    Rectal cancer Neg Hx    Stomach cancer Neg Hx      Current Outpatient Medications:    acetaminophen (TYLENOL) 500 MG tablet, Take 500 mg by mouth every 6 (six) hours as needed for headache (pain). , Disp: , Rfl:    albuterol (PROVENTIL) (2.5 MG/3ML) 0.083% nebulizer solution, Take 3 mLs (2.5 mg total) by nebulization every 4 (four) hours as needed for wheezing or shortness of breath., Disp: 75 mL, Rfl: 3   albuterol (VENTOLIN HFA) 108 (90 BASE) MCG/ACT inhaler, Inhale 2 puffs into the lungs every 4 (four) hours as needed for wheezing or shortness of breath., Disp: 18 each, Rfl: 11   ALPRAZolam (XANAX) 0.5 MG tablet, Take 1 tablet (0.5 mg total) by mouth 3 (three) times daily as needed., Disp: 90 tablet, Rfl:  5   amLODipine (NORVASC) 10 MG tablet, TAKE 1 TABLET BY MOUTH EVERY DAY, Disp: 90 tablet, Rfl: 3   atorvastatin (LIPITOR) 20 MG tablet, Take 1 tablet (20 mg total) by mouth daily., Disp: 90 tablet, Rfl: 3   azelastine (ASTELIN) 0.1 % nasal spray, Place 1 spray into both nostrils at bedtime., Disp: , Rfl:    Blood Glucose Monitoring Suppl (ONETOUCH VERIO FLEX SYSTEM) w/Device KIT, 1 each by Does not apply route daily at 12 noon., Disp: 1 kit, Rfl: 0   budesonide (ENTOCORT EC) 3 MG 24 hr capsule, TAKE 3 CAPSULES (9 MG TOTAL) BY MOUTH DAILY. PLEASE TAKE FOR ONE MORE MONTH THEN DISCONTINUE., Disp: 270 capsule, Rfl: 1   Cholecalciferol (VITAMIN D-3) 5000 UNITS TABS, Patient takes 1 tablet by mouth once a week on Mondays., Disp: , Rfl:    clopidogrel (PLAVIX) 75 MG tablet, TAKE 1 TABLET BY MOUTH DAILY. PLEASE CALL [phone] TO SCHEDULE AN OVERDUE APPOINTMENT WITH DR. MARK SKAINS FOR FUTURE REFILLS. THANK YOU. 2ND ATTEMPT., Disp: 90 tablet, Rfl: 3   clotrimazole-betamethasone (LOTRISONE) cream, Apply 1 Application topically daily. Apply twice daily to affected areas of feet and ankles for 4 weeks, Disp: 45 g, Rfl: 3   dapagliflozin propanediol (FARXIGA) 10 MG TABS tablet, Take 1 tablet (10 mg total) by mouth daily., Disp: 30 tablet, Rfl: 5   dicyclomine (BENTYL) 10 MG capsule, Take 1 capsule (10 mg total) by mouth 3 (three) times daily as needed for spasms., Disp: 90 capsule, Rfl: 3   diphenoxylate-atropine (LOMOTIL) 2.5-0.025 MG tablet, TAKE 1 TABLET BY MOUTH 2 TIMES DAILY AS NEEDED, Disp: 60 tablet, Rfl: 1   fluticasone (FLONASE) 50 MCG/ACT nasal spray, PLACE 2 SPRAYS IN EACH NOSTRIL AT BEDTIME, Disp: 16 g, Rfl: 6   gabapentin (NEURONTIN) 300 MG capsule, Take 1 capsule (300 mg total) by mouth 2 (two) times daily., Disp: 60 capsule, Rfl: 11   glimepiride (AMARYL) 1 MG tablet, TAKE 2 TABLETS (2 MG TOTAL) BY MOUTH DAILY WITH BREAKFAST, Disp: 180 tablet, Rfl: 0   HYDROcodone bit-homatropine (HYCODAN) 5-1.5  MG/5ML syrup, Take 5 mLs by mouth every 4 (four) hours as needed., Disp: 240 mL, Rfl: 0   HYDROcodone-acetaminophen (NORCO) 10-325 MG tablet, Take 1 tablet by mouth every 6 (six) hours as needed for moderate pain., Disp: 120 tablet, Rfl: 0   HYDROcodone-acetaminophen (NORCO) 10-325 MG tablet, Take 1 tablet by mouth every 6 (six) hours as needed for moderate pain., Disp: 120 tablet, Rfl: 0   HYDROcodone-acetaminophen (NORCO) 10-325 MG tablet, Take 1 tablet by mouth every 6 (six) hours as needed for moderate pain., Disp: 120 tablet, Rfl: 0  JANUVIA 100 MG tablet, TAKE 1 TABLET BY MOUTH EVERY DAY, Disp: 90 tablet, Rfl: 3   KLOR-CON M20 20 MEQ tablet, TAKE 1 TABLET BY MOUTH TWICE A DAY, Disp: 180 tablet, Rfl: 3   levocetirizine (XYZAL) 5 MG tablet, Take 5 mg by mouth every evening., Disp: , Rfl:    levofloxacin (LEVAQUIN) 500 MG tablet, Take 1 tablet (500 mg total) by mouth daily., Disp: 10 tablet, Rfl: 0   LIDODERM 5 %, APPLY 1 PATCH TO SKIN FOR 12 HOURS THEN REMOVE AND DISCARD PATCH WITHIN 72 HOURS OR AS DIRECTED, Disp: 30 patch, Rfl: 2   loperamide (IMODIUM) 2 MG capsule, Take 4-6 mg by mouth daily. Taking as needed only, Disp: , Rfl:    losartan (COZAAR) 25 MG tablet, Take 1 tablet (25 mg total) by mouth daily., Disp: 90 tablet, Rfl: 3   magnesium oxide (MAG-OX) 400 MG tablet, Take 3 tablets (1,200 mg total) by mouth daily., Disp: 90 tablet, Rfl: 5   mesalamine (LIALDA) 1.2 g EC tablet, TAKE 2 TABLETS (2.4 G TOTAL) BY MOUTH 2 (TWO) TIMES DAILY. NEEDS OFFICE VISIT FOR FURTHER REFILLS, Disp: 360 tablet, Rfl: 0   metoprolol succinate (TOPROL-XL) 100 MG 24 hr tablet, TAKE 1 TABLET BY MOUTH DAILY. TAKE WITH OR IMMEDIATELY FOLLOWING A MEAL., Disp: 90 tablet, Rfl: 1   montelukast (SINGULAIR) 10 MG tablet, Take one every morning, Disp: 90 tablet, Rfl: 3   neomycin-polymyxin-hydrocortisone (CORTISPORIN) OTIC solution, Place 4 drops into both ears 4 (four) times daily., Disp: 10 mL, Rfl: 2   nitroGLYCERIN  (NITROSTAT) 0.4 MG SL tablet, PLACE 1 TABLET UNDER THE TONGUE EVERY 5 MINUTES AS NEEDED FOR CHEST PAIN., Disp: 75 tablet, Rfl: 2   Olopatadine HCl 0.7 % SOLN, Place 1 drop into both eyes daily as needed (itching/allergies)., Disp: , Rfl:    OneTouch Delica Lancets 33G MISC, 1 each by Does not apply route daily at 12 noon., Disp: 100 each, Rfl: 0   ONETOUCH VERIO test strip, USE TO CHECK BLOOD GLUCOSE ONCE DAILY, Disp: 100 strip, Rfl: 1   Pancrelipase, Lip-Prot-Amyl, (ZENPEP) 40000-126000 units CPEP, Take by mouth. Take 2 tablets by mouth before meals and 1 tablet before snacks, Disp: , Rfl:    pantoprazole (PROTONIX) 40 MG tablet, Take 1 tablet (40 mg total) by mouth 2 (two) times daily., Disp: 180 tablet, Rfl: 0   Polyethyl Glycol-Propyl Glycol (SYSTANE OP), Place 1 drop into both eyes daily. For dry eyes, Disp: , Rfl:    TOBRADEX ophthalmic solution, Place 1 drop into both eyes 4 (four) times daily. As needed, Disp: , Rfl:    torsemide (DEMADEX) 20 MG tablet, TAKE 2 TABLETS BY MOUTH EVERY DAY, Disp: 180 tablet, Rfl: 0   ZENPEP 20000-63000 units CPEP, TAKE 2 CAPSULES (40,000 UNITS) WITH MEALS AND 1 CAPSULE (20,000 UNITS) WITH A SNACK, Disp: 240 capsule, Rfl: 1  Current Facility-Administered Medications:    ipratropium-albuterol (DUONEB) 0.5-2.5 (3) MG/3ML nebulizer solution 3 mL, 3 mL, Nebulization, Once, Nafziger, Kandee Keen, NP  EXAM:  VITALS per patient if applicable:  GENERAL: alert, oriented, appears well and in no acute distress  HEENT: atraumatic, conjunttiva clear, no obvious abnormalities on inspection of external nose and ears  NECK: normal movements of the head and neck  LUNGS: on inspection no signs of respiratory distress, breathing rate appears normal, no obvious gross SOB, gasping or wheezing  CV: no obvious cyanosis  MS: moves all visible extremities without noticeable abnormality  PSYCH/NEURO: pleasant and cooperative, no obvious depression  or anxiety, speech and thought  processing grossly intact  ASSESSMENT AND PLAN: Pain management. Indication for chronic opioid: low back pain Medication and dose: Norco 10-325 # pills per month: 120 Last UDS date: 06-29-22 Opioid Treatment Agreement signed (Y/N): 10-28-18 Opioid Treatment Agreement last reviewed with patient:  03-06-23 [facility] reviewed this encounter (include red flags): Yes Meds were refilled.  Gershon Crane, MD  Discussed the following assessment and plan:  No diagnosis found.     I discussed the assessment and treatment plan with the patient. The patient was provided an opportunity to ask questions and all were answered. The patient agreed with the plan and demonstrated an understanding of the instructions.   The patient was advised to call back or seek an in-person evaluation if the symptoms worsen or if the condition fails to improve as anticipated.      Review of Systems     Objective:   Physical Exam        Assessment & Plan:

## 2023-03-09 ENCOUNTER — Other Ambulatory Visit: Payer: Self-pay | Admitting: Family Medicine

## 2023-03-12 ENCOUNTER — Encounter: Payer: Self-pay | Admitting: Cardiology

## 2023-03-12 ENCOUNTER — Ambulatory Visit: Payer: PPO | Attending: Cardiology | Admitting: Cardiology

## 2023-03-12 ENCOUNTER — Ambulatory Visit: Payer: PPO | Admitting: Cardiology

## 2023-03-12 VITALS — BP 116/76 | HR 70 | Ht 60.0 in | Wt 220.0 lb

## 2023-03-12 DIAGNOSIS — G4733 Obstructive sleep apnea (adult) (pediatric): Secondary | ICD-10-CM | POA: Diagnosis not present

## 2023-03-12 DIAGNOSIS — I5042 Chronic combined systolic (congestive) and diastolic (congestive) heart failure: Secondary | ICD-10-CM

## 2023-03-12 DIAGNOSIS — I272 Pulmonary hypertension, unspecified: Secondary | ICD-10-CM | POA: Diagnosis not present

## 2023-03-12 DIAGNOSIS — I10 Essential (primary) hypertension: Secondary | ICD-10-CM

## 2023-03-12 DIAGNOSIS — E119 Type 2 diabetes mellitus without complications: Secondary | ICD-10-CM

## 2023-03-12 DIAGNOSIS — I2510 Atherosclerotic heart disease of native coronary artery without angina pectoris: Secondary | ICD-10-CM | POA: Diagnosis not present

## 2023-03-12 DIAGNOSIS — E7800 Pure hypercholesterolemia, unspecified: Secondary | ICD-10-CM | POA: Diagnosis not present

## 2023-03-12 NOTE — Patient Instructions (Signed)
Medication Instructions:  May take an extra dose or Torsemide for 3 days then return to your normal dose. Continue all other medications as listed.  *If you need a refill on your cardiac medications before your next appointment, please call your pharmacy*  Follow-Up: At [facility], you and your health needs are our priority.  As part of our continuing mission to provide you with exceptional heart care, we have created designated Provider Care Teams.  These Care Teams include your primary Cardiologist (physician) and Advanced Practice Providers (APPs -  Physician Assistants and Nurse Practitioners) who all work together to provide you with the care you need, when you need it.  We recommend signing up for the patient portal called "MyChart".  Sign up information is provided on this After Visit Summary.  MyChart is used to connect with patients for Virtual Visits (Telemedicine).  Patients are able to view lab/test results, encounter notes, upcoming appointments, etc.  Non-urgent messages can be sent to your provider as well.   To learn more about what you can do with MyChart, go to [email or web address].    Your next appointment:   6 month(s)  Provider:   Eligha Bridegroom, NP     Then, Donato Schultz, MD will plan to see you again in 1 year(s).

## 2023-03-12 NOTE — Progress Notes (Signed)
Cardiology Office Note:  .   Date:  03/12/2023  ID:  [name], DOB 1941/02/20, MRN [id number] PCP: Nelwyn Salisbury, MD  [facility] Providers Cardiologist:  Donato Schultz, MD     History of Present Illness: .   [name] is a 82 y.o. female Discussed with the use of AI scribe   History of Present Illness   Miss Helliwell, an 82 year old female with a complex medical history including chronic diastolic heart failure, secondary pulmonary hypertension, hyperlipidemia, diabetes, ulcerative colitis, and morbid obesity, presents for follow-up. She has a history of bare metal stent placement in the proximal LAD, with subsequent cardiac catheterizations showing a patent stent. In April 2023, she experienced a significant health event where she was found unresponsive, hypotensive, and bradycardic, with concerns for sepsis. Her ejection fraction at that time was 35-40%, with moderately reduced RV function. Cardiac catheterization revealed nonobstructive CAD with a 90% lesion in the apical/distal LAD, normal overall LV function, and moderate pulmonary artery hypertension. She was eventually weaned off pressors and discharged home with BiPAP.  She has been managing her chronic combined heart failure with losartan, metoprolol, and torsemide. Her coronary disease, with a history of LAD stent placement and apical LAD disease, is being managed medically with metoprolol, losartan, Plavix, and atorvastatin. Her hyperlipidemia is being managed with atorvastatin, with a goal LDL of less than 55. Her last LDL was 66 in February 2023.  She reports feeling okay overall, with the exception of back problems. Her breathing has improved with the use of Trelegy. She uses a BiPAP machine for about 5-6 hours daily and supplemental oxygen when she lays down for a nap. She has been experiencing some lower extremity edema and a slight increase in weight. She is conscious about her diet, checks her blood sugar throughout  the day, and avoids excessive salt intake. Despite her back problems, she remains active within her home.            Studies Reviewed: .        Results LABS Creatinine: 0.8 Hemoglobin: 11.8 Hemoglobin A1c: 6.8 LDL: 66 (06/16/2021)  DIAGNOSTIC Ejection Fraction: 35-40% (April 2023) Right and Left Heart Catheterization: Nonobstructive CAD with 90% lesion in apical/distal LAD, normal overall LV function, moderate pulmonary artery hypertension (April 2023) EKG: Prolonged QT interval up to 514 ms  Risk Assessment/Calculations:            Physical Exam:   VS:  BP 116/76   Pulse 70   Ht 5' (1.524 m)   Wt 220 lb (99.8 kg)   LMP  (LMP Unknown)   SpO2 93%   BMI 42.97 kg/m    Wt Readings from Last 3 Encounters:  03/12/23 220 lb (99.8 kg)  10/12/22 209 lb 9.6 oz (95.1 kg)  10/11/22 209 lb 6.4 oz (95 kg)    GEN: Well nourished, well developed in no acute distress NECK: No JVD; No carotid bruits CARDIAC: RRR, no murmurs, no rubs, no gallops RESPIRATORY:  Clear to auscultation without rales, wheezing or rhonchi  ABDOMEN: Soft, non-tender, non-distended EXTREMITIES:  2+ LLE edema; No deformity   ASSESSMENT AND PLAN: .    Assessment and Plan    Chronic Diastolic Heart Failure Chronic diastolic heart failure with secondary pulmonary hypertension. EF 35-40% (April 2023). RV moderately reduced. Right and left heart catheterization showed nonobstructive CAD with 90% lesion in the apical/distal LAD. Patient declined SGLT2 inhibitor due to prolonged QT (up to 514 ms). - Continue losartan 25 mg daily -  Continue metoprolol succinate 100 mg daily - Continue torsemide 20 mg, two tablets daily - Take an extra 20 mg torsemide for 3-4 days if fluid accumulation is noted - Follow up in 6 months  Pulmonary Hypertension Moderate pulmonary artery hypertension likely group 3. Improved breathing with BiPAP and Trelegy inhaler. Avoid Zofran, hydroxyzine, loperamide, Zanaflex due to prolonged  QT interval. - Continue BiPAP - Continue Trelegy inhaler - Follow up in 6 months  Hyperlipidemia Hyperlipidemia with LDL goal <55. Prior LDL 66 (06/16/2021). On atorvastatin. - Continue atorvastatin 20 mg daily - Follow up in 6 months  Diabetes Mellitus Diabetes mellitus with hemoglobin A1c of 6.8. Well-managed with current regimen. - Continue current diabetes management - Follow up in 6 months  Morbid Obesity Morbid obesity, weight 220 lbs. Conscious about diet and exercise despite back problems. Encouraged to continue physical activity within limits of back pain. - Continue current diet and exercise regimen - Monitor weight and fluid intake - Follow up in 6 months  Ulcerative Colitis Ulcerative colitis, no specific issues discussed. - Continue current management - Follow up as needed  General Health Maintenance Creatinine 0.8, hemoglobin 11.8, no significant anemia. - Continue monitoring kidney function and hemoglobin levels - Follow up in 6 months  Follow-up - Follow up in 6 months with Rafael Bihari - Follow up in 1 year with primary doctor.               Signed, Donato Schultz, MD

## 2023-03-14 ENCOUNTER — Other Ambulatory Visit: Payer: Self-pay | Admitting: Family Medicine

## 2023-03-14 ENCOUNTER — Telehealth: Payer: Self-pay | Admitting: *Deleted

## 2023-03-14 NOTE — Telephone Encounter (Signed)
   Pre-operative Risk Assessment    Patient Name: [name]  DOB: 1941/02/10 MRN: [id number]  Last OV: Dr. Anne Fu 03/12/2023 Upcoming OV: None      Request for Surgical Clearance    Procedure:   Right L5-S1 IL ESI  Date of Surgery:  Clearance TBD                                 Surgeon:  Dr. Romero Belling Surgeon's Group or Practice Name:  [facility] Phone number:  [phone] Fax number:  [phone]   Type of Clearance Requested:   - Medical  - Pharmacy:  Hold Clopidogrel (Plavix) Not Indicated.   Type of Anesthesia:  Not Indicated   Additional requests/questions:    Signed, Emmit Pomfret   03/14/2023, 9:28 AM

## 2023-03-18 ENCOUNTER — Telehealth: Payer: Self-pay | Admitting: Cardiology

## 2023-03-18 DIAGNOSIS — I2609 Other pulmonary embolism with acute cor pulmonale: Secondary | ICD-10-CM | POA: Diagnosis not present

## 2023-03-18 DIAGNOSIS — J189 Pneumonia, unspecified organism: Secondary | ICD-10-CM | POA: Diagnosis not present

## 2023-03-18 DIAGNOSIS — J9601 Acute respiratory failure with hypoxia: Secondary | ICD-10-CM | POA: Diagnosis not present

## 2023-03-18 NOTE — Telephone Encounter (Signed)
   Patient Name: [name]  DOB: 1940-08-13 MRN: [id number]  Primary Cardiologist: Donato Schultz, MD  Chart reviewed as part of pre-operative protocol coverage. Given past medical history and time since last visit, based on ACC/AHA guidelines, Tamyrah Blancett is at acceptable risk for the planned procedure without further cardiovascular testing.   The patient was advised that if she develops new symptoms prior to surgery to contact our office to arrange for a follow-up visit, and she verbalized understanding.  Patient can hold Plavix 5 days prior to procedure and should restart postprocedure when surgically safe and hemostasis is achieved.  I will route this recommendation to the requesting party via Epic fax function and remove from pre-op pool.  Please call with questions.  Napoleon Form, Leodis Rains, NP 03/18/2023, 8:13 AM

## 2023-03-18 NOTE — Telephone Encounter (Signed)
Darl Pikes from [facility] is calling because they would like the clearance to be extended to 7 days instead of 5 days. Darl Pikes requested we fax it to [phone] with Attn Dr. Romero Belling.

## 2023-03-19 ENCOUNTER — Encounter: Payer: Self-pay | Admitting: Podiatry

## 2023-03-19 ENCOUNTER — Ambulatory Visit (INDEPENDENT_AMBULATORY_CARE_PROVIDER_SITE_OTHER): Payer: PPO | Admitting: Podiatry

## 2023-03-19 DIAGNOSIS — B351 Tinea unguium: Secondary | ICD-10-CM | POA: Diagnosis not present

## 2023-03-19 DIAGNOSIS — M79674 Pain in right toe(s): Secondary | ICD-10-CM | POA: Diagnosis not present

## 2023-03-19 DIAGNOSIS — M79675 Pain in left toe(s): Secondary | ICD-10-CM

## 2023-03-19 DIAGNOSIS — B353 Tinea pedis: Secondary | ICD-10-CM

## 2023-03-19 MED ORDER — KETOCONAZOLE 2 % EX CREA: 1.0000 | TOPICAL_CREAM | Freq: Every day | CUTANEOUS | 2 refills | Status: DC

## 2023-03-19 NOTE — Progress Notes (Unsigned)
Subjective:  Patient ID: [name], female    DOB: Sep 15, 1940,  MRN: [id number]  [name] presents to clinic today for:  Chief Complaint  Patient presents with   Diabetes    PATIENT STATES THAT SHE JUST NEEDS HER TOE NAILS CUT , PATIENT STATES SHE DOES NOT WANT HER RF HALLUX CUT BUT SHE WILL LIKE THE REST OF HER TOE NAILS CUT . PATIENT STATES SHE HAS A RASH ON BILATERAL FEET AND IT IS GOING UP HER LEG SHE WILL LIKE A RE ORDER OF CREAM THAT DOCTOR PRESCRIBED TO HER.   Patient notes nails are thick, discolored, elongated and painful in shoegear when trying to ambulate.  She doesn't want the right hallux nail trimmed because she felt it was trimmed too short last time and didn't grow much.  States the Lotrisone cream has not been working so well for her and is open to trying something else.  Notes there is moderate itching as well.  PCP is Nelwyn Salisbury, MD.  Past Medical History:  Diagnosis Date   Adenomatous colon polyp 02/2006   Allergy    Allergy, unspecified not elsewhere classified    Anemia    Anxiety    Arthritis    Asthma    Blood transfusion without reported diagnosis    Bronchiectasis    CAD (coronary artery disease)    BMS to the LAD, 2002; cath 12/07/11 patent LAD stent and mild nonobstructive disease, EF 65%; Medical management   Cataract    removed both eyes   CHF (congestive heart failure) (HCC)    Diastolic   Chronic diastolic CHF (congestive heart failure) (HCC) 11/25/2011   Clotting disorder (HCC)    in legs per pt    Diabetes mellitus    Diverticulosis of colon    DJD (degenerative joint disease)    Fibromyalgia    GERD (gastroesophageal reflux disease)    H. pylori infection 05/12/2019   Hypercholesterolemia    Hypertension    Microscopic hematuria    Neoplasm of kidney    Obesity    Other diseases of lung, not elsewhere classified    Pinched vertebral nerve    Pulmonary nodule    Negative PET in 2010   Stress incontinence, female     Syncope    Ulcerative colitis, left sided (HCC) 2008   Hx of   UTI (lower urinary tract infection)    Vitamin D deficiency disease     Past Surgical History:  Procedure Laterality Date   ABDOMINAL HYSTERECTOMY     CARDIAC CATHETERIZATION  11/2011   CATARACT EXTRACTION, BILATERAL  2020   CHOLECYSTECTOMY     COLONOSCOPY  01/09/2018   per Dr. Russella Dar, adenomatous polyps, repeat in 3 yrs   CORONARY STENT PLACEMENT  2002   BMS to the LAD   ESOPHAGOGASTRODUODENOSCOPY  12/2008   HAND SURGERY  01/2006   Right Hand Surgery by Dr. Amanda Pea   LEFT HEART CATHETERIZATION WITH CORONARY ANGIOGRAM N/A 12/07/2011   Procedure: LEFT HEART CATHETERIZATION WITH CORONARY ANGIOGRAM;  Surgeon: Peter M Swaziland, MD;  Location: [facility] CATH LAB;  Service: Cardiovascular;  Laterality: N/A;   RIGHT/LEFT HEART CATH AND CORONARY ANGIOGRAPHY N/A 08/07/2021   Procedure: RIGHT/LEFT HEART CATH AND CORONARY ANGIOGRAPHY;  Surgeon: Dolores Patty, MD;  Location: [facility] INVASIVE CV LAB;  Service: Cardiovascular;  Laterality: N/A;   UPPER GASTROINTESTINAL ENDOSCOPY      Allergies  Allergen Reactions   Doxycycline Other (See Comments)    Unsteady  gait   Aspirin Nausea And Vomiting   Azithromycin Other (See Comments)    REACTION: pt states "ZPak doesn't work"   Caffeine Nausea And Vomiting   Ciprofloxacin Nausea And Vomiting    Tolerates levaquin   Codeine Nausea And Vomiting   Oxycodone Other (See Comments)    Pt stated, "upsets my stomach" (11/19/17)   Sulfamethizole Other (See Comments)    Unknown reaction   Tramadol Hcl Other (See Comments)    REACTION: pt states "spaced-out"   Review of Systems: Negative except as noted in the HPI.  Objective:  [name] is a pleasant 82 y.o. female in NAD. AAO x 3.  Vascular Examination: Capillary refill time is 3-5 seconds to toes bilateral. Palpable pedal pulses b/l LE. Digital hair present b/l.  Skin temperature gradient WNL b/l. No varicosities b/l. No cyanosis noted  b/l.   Dermatological Examination: Pedal skin with normal turgor, texture and tone b/l. No open wounds. No interdigital macerations b/l. Toenails x10 are 3mm thick, discolored, dystrophic with subungual debris. There is pain with compression of the nail plates.  They are elongated x10.  Peeling and scaling of plantar feet bilateral.     Latest Ref Rng & Units 10/11/2022   10:59 AM  Hemoglobin A1C  Hemoglobin-A1c 4.0 - 5.6 % 6.8    Assessment/Plan: 1. Pain due to onychomycosis of toenails of both feet   2. Tinea pedis of both feet     Meds ordered this encounter  Medications   ketoconazole (NIZORAL) 2 % cream    Sig: Apply 1 Application topically daily. Apply 1gm to bilateral foot  once daily    Dispense:  60 g    Refill:  2   The mycotic toenails were sharply debrided x10 with sterile nail nippers and a power debriding burr to decrease bulk/thickness and length.    Will have patient d/c ketoconazole cream and will send in Rx Lotrisone cream.   Apply BID to affected areas of feet for 4 weeks.  Return in about 3 months (around 06/19/2023) for [facility].   Clerance Lav, DPM, FACFAS [facility]     [address] [place], [place] [address]                Office [phone]  Fax [phone]

## 2023-03-20 NOTE — Telephone Encounter (Signed)
I will send to preop APP for review

## 2023-03-25 ENCOUNTER — Other Ambulatory Visit: Payer: Self-pay | Admitting: Podiatry

## 2023-03-25 DIAGNOSIS — B353 Tinea pedis: Secondary | ICD-10-CM

## 2023-03-25 NOTE — Telephone Encounter (Signed)
   Patient Name: [name]  DOB: 12-26-1940 MRN: [id number]  Primary Cardiologist: Donato Schultz, MD  Chart reviewed as part of pre-operative protocol coverage. Pre-op clearance already addressed by colleagues in earlier phone notes. To summarize recommendations:  -Okay to hold ASA x 7 day pre-procedure and resume when medically safe to do so. -Dr. Anne Fu  Will route this bundled recommendation to requesting provider via Epic fax function and remove from pre-op pool. Please call with questions.  Sharlene Dory, PA-C 03/25/2023, 1:29 PM

## 2023-04-03 DIAGNOSIS — J984 Other disorders of lung: Secondary | ICD-10-CM | POA: Diagnosis not present

## 2023-04-05 ENCOUNTER — Other Ambulatory Visit: Payer: Self-pay | Admitting: Gastroenterology

## 2023-04-09 ENCOUNTER — Telehealth: Payer: Self-pay | Admitting: Cardiology

## 2023-04-09 NOTE — Telephone Encounter (Signed)
New Message:     Pt c/o medication issue:  1. Name of Medication:   2. How are you currently taking this medication (dosage and times per day)?   3. Are you having a reaction (difficulty breathing--STAT)?   4. What is your medication issue? Patient wants to know if she is on a blood thinner

## 2023-04-09 NOTE — Telephone Encounter (Signed)
Returned call to patient, she states she already found her answer and states she is taking Plavix which is a kind of blood thinner.  No other needs voiced at this time, patient expressed appreciation for follow-up.

## 2023-04-13 ENCOUNTER — Other Ambulatory Visit: Payer: Self-pay | Admitting: Family Medicine

## 2023-04-14 ENCOUNTER — Other Ambulatory Visit: Payer: Self-pay | Admitting: Gastroenterology

## 2023-04-17 DIAGNOSIS — J9601 Acute respiratory failure with hypoxia: Secondary | ICD-10-CM | POA: Diagnosis not present

## 2023-04-17 DIAGNOSIS — J189 Pneumonia, unspecified organism: Secondary | ICD-10-CM | POA: Diagnosis not present

## 2023-04-17 DIAGNOSIS — I2609 Other pulmonary embolism with acute cor pulmonale: Secondary | ICD-10-CM | POA: Diagnosis not present

## 2023-04-17 DIAGNOSIS — M5416 Radiculopathy, lumbar region: Secondary | ICD-10-CM | POA: Diagnosis not present

## 2023-04-20 ENCOUNTER — Other Ambulatory Visit: Payer: Self-pay | Admitting: Family Medicine

## 2023-04-22 ENCOUNTER — Ambulatory Visit: Payer: Self-pay | Admitting: Family Medicine

## 2023-04-22 ENCOUNTER — Telehealth: Payer: Self-pay

## 2023-04-22 ENCOUNTER — Encounter: Payer: Self-pay | Admitting: Internal Medicine

## 2023-04-22 ENCOUNTER — Ambulatory Visit (INDEPENDENT_AMBULATORY_CARE_PROVIDER_SITE_OTHER): Payer: PPO | Admitting: Internal Medicine

## 2023-04-22 VITALS — BP 110/70 | HR 76 | Temp 98.5°F | Ht 60.0 in

## 2023-04-22 DIAGNOSIS — H9201 Otalgia, right ear: Secondary | ICD-10-CM | POA: Diagnosis not present

## 2023-04-22 DIAGNOSIS — H6120 Impacted cerumen, unspecified ear: Secondary | ICD-10-CM | POA: Diagnosis not present

## 2023-04-22 MED ORDER — LEVOFLOXACIN 500 MG PO TABS
500.0000 mg | ORAL_TABLET | Freq: Every day | ORAL | 0 refills | Status: AC
Start: 2023-04-22 — End: 2023-04-29

## 2023-04-22 NOTE — Progress Notes (Signed)
Patient Care Team: Nelwyn Salisbury, MD as PCP - General (Family Medicine) Jake Bathe, MD as PCP - Cardiology (Cardiology) Drema Dallas, DO as Consulting Physician (Neurology) Sherrill Raring, [facility] (Pharmacist)  Visit Date: 04/22/23  Subjective:    Patient ID: [name] , Female   DOB: 09-02-1940, 82 y.o.    MRN: [id number]   82 y.o. Female presents today for right ear pain, malodorous cerumen right ear since 04/15/23. Denies fever, sore throat, cough. Ear pain is disrupting her sleep. She has a remote history of ear pain years ago but this is worse. She has not tried to remove cerumen from right ear. She sees Dr. Clent Ridges for primary care and was sent here today by the call center regarding ear pain.   History of chronic rhinorrhea, bronchitis. She is on chronic anticoagulation with Plavix.   History of chronic respiratory failure, sleep apnea and followed by pulmonology. History of asthma, allergies followed by allergist. She is on chronic narcotic medication for musculoskeletal pain. She uses Symbicort, albuterol. Hospitalized April 2023 with altered mental status and acute hypercarbic and hypoxic respiratory failure requiring intubation. Found to have significant pulmonary hypertension, discharged on trilogy noninvasive vent and home oxygen. Smoked 10-15 years up to 2 packs per day and quit in 1975. Patient remains on oxygen 2 L with activity. History of pneumonia infection 09/2021.  History of ulcerative colitis, pancreatic insufficiency and has seen Dr. Russella Dar. Started on budesonide 2024 and diarrhea resolved.   Medical history significant for CHF, coronary artery disease, Type 2 diabetes mellitus. History of moderate to severe tricuspid regurgitation. History of coronary disease, right heart cath in 2023.  Past Medical History:  Diagnosis Date   Adenomatous colon polyp 02/2006   Allergy    Allergy, unspecified not elsewhere classified    Anemia    Anxiety    Arthritis     Asthma    Blood transfusion without reported diagnosis    Bronchiectasis    CAD (coronary artery disease)    BMS to the LAD, 2002; cath 12/07/11 patent LAD stent and mild nonobstructive disease, EF 65%; Medical management   Cataract    removed both eyes   CHF (congestive heart failure) (HCC)    Diastolic   Chronic diastolic CHF (congestive heart failure) (HCC) 11/25/2011   Clotting disorder (HCC)    in legs per pt    Diabetes mellitus    Diverticulosis of colon    DJD (degenerative joint disease)    Fibromyalgia    GERD (gastroesophageal reflux disease)    H. pylori infection 05/12/2019   Hypercholesterolemia    Hypertension    Microscopic hematuria    Neoplasm of kidney    Obesity    Other diseases of lung, not elsewhere classified    Pinched vertebral nerve    Pulmonary nodule    Negative PET in 2010   Stress incontinence, female    Syncope    Ulcerative colitis, left sided (HCC) 2008   Hx of   UTI (lower urinary tract infection)    Vitamin D deficiency disease      Family History  Problem Relation Age of Onset   Pneumonia Father    Heart attack Father    Colon polyps Father    Parkinsonism Mother    Diabetes Brother    Sarcoidosis Brother    Hypertension Sister    Diabetes Sister    Breast cancer Daughter    Colon cancer Neg Hx  Esophageal cancer Neg Hx    Rectal cancer Neg Hx    Stomach cancer Neg Hx     Social hx: accompanied by her son today.     Review of Systems  Constitutional:  Negative for fever and malaise/fatigue.  HENT:  Positive for ear pain (Right). Negative for congestion and sore throat.   Eyes:  Negative for blurred vision.  Respiratory:  Negative for cough and shortness of breath.   Cardiovascular:  Negative for chest pain, palpitations and leg swelling.  Gastrointestinal:  Negative for vomiting.  Musculoskeletal:  Negative for back pain.  Skin:  Negative for rash.  Neurological:  Negative for loss of consciousness and headaches.         Objective:   Vitals: BP 110/70   Pulse 76   Temp 98.5 F (36.9 C)   Ht 5' (1.524 m)   LMP  (LMP Unknown)   SpO2 94%   BMI 42.97 kg/m    Physical Exam Vitals and nursing note reviewed.  Constitutional:      Appearance: Normal appearance.  HENT:     Head: Normocephalic and atraumatic.     Right Ear: Hearing, ear canal and external ear normal.     Left Ear: Hearing, ear canal and external ear normal.     Ears:     Comments: Left TM obscured by cerumen. Right TM not well observed-- only upper area visible. Some cerumen right external ear canal. Right TM dull, centrally injected.    Mouth/Throat:     Pharynx: Oropharynx is clear.  Pulmonary:     Effort: Pulmonary effort is normal.  Skin:    General: Skin is warm and dry.  Neurological:     Mental Status: She is alert and oriented to person, place, and time. Mental status is at baseline.  Psychiatric:        Mood and Affect: Mood normal.        Behavior: Behavior normal.        Thought Content: Thought content normal.        Judgment: Judgment normal.       Results:   Studies obtained and personally reviewed by me:   Labs:       Component Value Date/Time   NA 136 09/04/2022 1030   NA 142 09/08/2019 1134   K 3.9 09/04/2022 1030   CL 99 09/04/2022 1030   CO2 28 09/04/2022 1030   GLUCOSE 153 (H) 09/04/2022 1030   BUN 15 09/04/2022 1030   BUN 16 09/08/2019 1134   CREATININE 0.80 09/04/2022 1030   CALCIUM 9.3 09/04/2022 1030   PROT 7.4 09/04/2022 1030   PROT 6.6 12/10/2019 0744   ALBUMIN 4.0 09/04/2022 1030   ALBUMIN 4.5 12/10/2019 0744   AST 10 09/04/2022 1030   ALT 5 09/04/2022 1030   ALKPHOS 79 09/04/2022 1030   BILITOT 0.4 09/04/2022 1030   BILITOT 0.5 12/10/2019 0744   GFRNONAA >60 11/22/2021 1130   GFRAA 102 09/08/2019 1134     Lab Results  Component Value Date   WBC 11.2 (H) 09/04/2022   HGB 11.8 (L) 09/04/2022   HCT 35.4 (L) 09/04/2022   MCV 90.7 09/04/2022   PLT 532.0 (H)  09/04/2022    Lab Results  Component Value Date   CHOL 146 06/16/2021   HDL 58.60 06/16/2021   LDLCALC 66 06/16/2021   TRIG 104.0 06/16/2021   CHOLHDL 2 06/16/2021    Lab Results  Component Value Date   HGBA1C 6.8 (A)  10/11/2022     Lab Results  Component Value Date   TSH 1.41 09/04/2022      Assessment & Plan:   Right otalgia Cerumen in right ear  Possible Acute right otitis media:    Plan: prescribed levofloxacin 500 mg daily for 7 days. Contact us if symptoms worsen or fail to improve.    I,Alexander Ruley,acting as a Neurosurgeon for Margaree Mackintosh, MD.,have documented all relevant documentation on the behalf of Margaree Mackintosh, MD,as directed by  Margaree Mackintosh, MD while in the presence of Margaree Mackintosh, MD.   I, Margaree Mackintosh, MD, have reviewed all documentation for this visit. The documentation on 04/27/23 for the exam, diagnosis, procedures, and orders are all accurate and complete.

## 2023-04-22 NOTE — Telephone Encounter (Signed)
Patient would like a call back from the nurse today

## 2023-04-22 NOTE — Telephone Encounter (Signed)
Copied from CRM [id number]. Topic: Clinical - Medical Advice >> Apr 22, 2023 10:41 AM Florestine Avers wrote: Reason for CRM: Pt states that she has a ear infection, says its getting worse and may need antibiotic. Is requesting a call from the nurse and a rx.

## 2023-04-22 NOTE — Telephone Encounter (Signed)
  Chief Complaint: Right ear pain Symptoms: brown runny discharge from right ear with odor, right ear pain Frequency: x 4 days Pertinent Negatives: Patient denies fever, headache, dizziness, nausea or vomiting, cough, congestion, submerging her ears or head in water, recent facial or head injury Disposition: [] ED /[] Urgent Care (no appt availability in office) / [x] Appointment(In office/virtual)/ []  [facility]/ [] Home Care/ [] Refused Recommended Disposition /[] [facility]/ []  Follow-up with PCP Additional Notes: Pt c/o right ear pain, brown discharge and odor x 4 days. Pt requesting virtual appt, explained limited assessment as she will need her ears examined. No available appts with [facility], pt and her family member agreeable to appt this after with Dr Sharlet Salina.  Copied from CRM [id number]. Topic: Clinical - Red Word Triage >> Apr 22, 2023 11:21 AM Lennart Pall wrote: Red Word that prompted transfer to Nurse Triage: Very bad ear infection, has an odor to it too. Reason for Disposition  Diabetes mellitus or weak immune system (e.g., HIV positive, cancer chemo, splenectomy, organ transplant, chronic steroids)  Answer Assessment - Initial Assessment Questions 1. LOCATION: "Which ear is involved?"      Right 2. COLOR: "What is the color of the discharge?"      Brown 3. CONSISTENCY: "How runny is the discharge? Could it be water?"      Runny. 4. ONSET: "When did you first notice the discharge?"     X 4 days 5. PAIN: "Is there any earache?" "How bad is it?"  (Scale 1-10; or mild, moderate, severe)     10/10 pain in right ear, sharp. 6. OBJECTS: "Have you put anything in your ear?" (e.g., Q-tip, other object)      Pt states she placed a Q tip in there to see the fluid and noticed an odor.  7. OTHER SYMPTOMS: "Do you have any other symptoms?" (e.g., headache, fever, dizziness, vomiting, runny nose)     Runny nose on and off chronically per pt.  Protocols  used: Ear - Discharge-A-AH

## 2023-04-23 NOTE — Telephone Encounter (Signed)
Spoke to patient and she has been seen and given an antibiotic.

## 2023-05-01 ENCOUNTER — Other Ambulatory Visit: Payer: Self-pay | Admitting: Family Medicine

## 2023-05-02 ENCOUNTER — Other Ambulatory Visit: Payer: Self-pay | Admitting: Urology

## 2023-05-02 ENCOUNTER — Other Ambulatory Visit: Payer: Self-pay | Admitting: Family Medicine

## 2023-05-02 ENCOUNTER — Telehealth: Payer: Self-pay

## 2023-05-02 DIAGNOSIS — E119 Type 2 diabetes mellitus without complications: Secondary | ICD-10-CM

## 2023-05-02 DIAGNOSIS — H9201 Otalgia, right ear: Secondary | ICD-10-CM

## 2023-05-02 DIAGNOSIS — N281 Cyst of kidney, acquired: Secondary | ICD-10-CM

## 2023-05-02 NOTE — Telephone Encounter (Signed)
 Duplicate message.

## 2023-05-02 NOTE — Addendum Note (Signed)
 Addended by: Gershon Crane A on: 05/02/2023 12:27 PM   Modules accepted: Orders

## 2023-05-02 NOTE — Telephone Encounter (Signed)
 I read Dr. Beryle Quant note and I think Yelena should see an ENT now. I did the referral

## 2023-05-02 NOTE — Telephone Encounter (Signed)
Spoke with pt advised of Dr Fry recommendation, verbalized understanding 

## 2023-05-02 NOTE — Telephone Encounter (Signed)
 Copied from CRM [id number]. Topic: Clinical - Medical Advice >> Apr 30, 2023 10:03 AM Merlynn LABOR wrote: Reason for CRM: Pt son Shauna Bodkins) called in stating the pt was seen by [facility] on 12/23 for her ear and she was given antibotics for seven days. Pt son stated the pt is not feeling better and he does not know if the provider wants the pt to be referred to an ear doctor [phone]# [phone]

## 2023-05-02 NOTE — Telephone Encounter (Signed)
 Copied from CRM [id number]. Topic: General - Other >> Apr 29, 2023 10:26 AM Rosina BIRCH wrote: Reason for CRM: Pt son Arrayah Connors) called in stating the pt was seen by [facility]ler on 12/23 for her ear and she was given antibotics for seven days. Pt son stated the pt is not feeling better and he does not know if the provider wants the pt to be referred to an ear doctor CB# [phone]

## 2023-05-04 DIAGNOSIS — J984 Other disorders of lung: Secondary | ICD-10-CM | POA: Diagnosis not present

## 2023-05-14 ENCOUNTER — Ambulatory Visit
Admission: EM | Admit: 2023-05-14 | Discharge: 2023-05-14 | Disposition: A | Discharge status: Home / Self Care | Payer: PPO

## 2023-05-14 ENCOUNTER — Encounter: Payer: Self-pay | Admitting: Emergency Medicine

## 2023-05-14 DIAGNOSIS — H9201 Otalgia, right ear: Secondary | ICD-10-CM

## 2023-05-14 NOTE — Discharge Instructions (Addendum)
 May take tylenol as label directed for pain, do not stick anything in ears, no water in ears. Keep appt as scheduled with ENT, I have also included [facility] in case they can see you sooner

## 2023-05-14 NOTE — ED Provider Notes (Signed)
 [facility]    CSN: [id number] Arrival date & time: 05/14/23  1129      History   Chief Complaint Chief Complaint  Patient presents with   Otalgia    HPI [name] is a 83 y.o. female.   83 year old female, [name], presents to urgent care for right ear pain x 1 month. Seen 04/22/2023 and treated with Levaquin . Pt has follow up appt with ENT end of month,but wanted to be seen sooner. No rash, no fever,no trauma or fall/no injury to right ear.  The history is provided by the patient. No language interpreter was used.  Otalgia Associated symptoms: no ear discharge, no fever and no rash     Past Medical History:  Diagnosis Date   Adenomatous colon polyp 02/2006   Allergy    Allergy, unspecified not elsewhere classified    Anemia    Anxiety    Arthritis    Asthma    Blood transfusion without reported diagnosis    Bronchiectasis    CAD (coronary artery disease)    BMS to the LAD, 2002; cath 12/07/11 patent LAD stent and mild nonobstructive disease, EF 65%; Medical management   Cataract    removed both eyes   CHF (congestive heart failure) (HCC)    Diastolic   Chronic diastolic CHF (congestive heart failure) (HCC) 11/25/2011   Clotting disorder (HCC)    in legs per pt    Diabetes mellitus    Diverticulosis of colon    DJD (degenerative joint disease)    Fibromyalgia    GERD (gastroesophageal reflux disease)    H. pylori infection 05/12/2019   Hypercholesterolemia    Hypertension    Microscopic hematuria    Neoplasm of kidney    Obesity    Other diseases of lung, not elsewhere classified    Pinched vertebral nerve    Pulmonary nodule    Negative PET in 2010   Stress incontinence, female    Syncope    Ulcerative colitis, left sided (HCC) 2008   Hx of   UTI (lower urinary tract infection)    Vitamin D  deficiency disease     Patient Active Problem List   Diagnosis Date Noted   Acute otalgia, right 05/14/2023   Chronic respiratory  failure with hypoxia (HCC) 12/26/2021   Hypomagnesemia 08/21/2021   Acute respiratory failure with hypoxia (HCC) 08/06/2021   Community acquired pneumonia of right lung 08/06/2021   Cor pulmonale, acute (HCC) 08/06/2021   Acute HFrEF (heart failure with reduced ejection fraction) (HCC) 08/06/2021   OSA (obstructive sleep apnea) 08/06/2021   Cervical radicular pain 08/06/2021   Cardiogenic shock (HCC) 08/03/2021   Leg cramps 07/18/2020   Low back pain 10/21/2018   Fibromyalgia 01/03/2017   Ulcerative colitis (HCC) 11/25/2011   Coronary artery disease involving native coronary artery of native heart with angina pectoris (HCC)    Bronchiectasis (HCC)    Essential hypertension    Vitamin D  deficiency 12/03/2009   Depression 08/10/2008   PULMONARY NODULE 07/15/2008   NEOPLASM, KIDNEY 03/16/2008   STRESS INCONTINENCE 03/16/2008   COLONIC POLYPS 07/19/2007   Diabetes mellitus with coincident hypertension (HCC) 07/19/2007   Morbid obesity (HCC) 07/19/2007   Asthma 07/19/2007   DIVERTICULOSIS OF COLON 07/19/2007   ANXIETY 03/19/2007   GERD 03/19/2007   Osteoarthritis 03/19/2007   FIBROMYALGIA 03/19/2007    Past Surgical History:  Procedure Laterality Date   ABDOMINAL HYSTERECTOMY     CARDIAC CATHETERIZATION  11/2011  CATARACT EXTRACTION, BILATERAL  2020   CHOLECYSTECTOMY     COLONOSCOPY  01/09/2018   per Dr. Aneita, adenomatous polyps, repeat in 3 yrs   CORONARY STENT PLACEMENT  2002   BMS to the LAD   ESOPHAGOGASTRODUODENOSCOPY  12/2008   HAND SURGERY  01/2006   Right Hand Surgery by Dr. Camella   LEFT HEART CATHETERIZATION WITH CORONARY ANGIOGRAM N/A 12/07/2011   Procedure: LEFT HEART CATHETERIZATION WITH CORONARY ANGIOGRAM;  Surgeon: Peter M Jordan, MD;  Location: [facility] CATH LAB;  Service: Cardiovascular;  Laterality: N/A;   RIGHT/LEFT HEART CATH AND CORONARY ANGIOGRAPHY N/A 08/07/2021   Procedure: RIGHT/LEFT HEART CATH AND CORONARY ANGIOGRAPHY;  Surgeon: Cherrie Toribio SAUNDERS, MD;   Location: [facility] INVASIVE CV LAB;  Service: Cardiovascular;  Laterality: N/A;   UPPER GASTROINTESTINAL ENDOSCOPY      OB History   No obstetric history on file.      Home Medications    Prior to Admission medications   Medication Sig Start Date End Date Taking? Authorizing Provider  acetaminophen  (TYLENOL ) 500 MG tablet Take 500 mg by mouth every 6 (six) hours as needed for headache (pain).    Yes [provider]  albuterol  (PROVENTIL ) (2.5 MG/3ML) 0.083% nebulizer solution Take 3 mLs (2.5 mg total) by nebulization every 4 (four) hours as needed for wheezing or shortness of breath. 02/12/17  Yes [name], MD  albuterol  (VENTOLIN  HFA) 108 (90 BASE) MCG/ACT inhaler Inhale 2 puffs into the lungs every 4 (four) hours as needed for wheezing or shortness of breath. 05/25/14  Yes [name], MD  ALPRAZolam  (XANAX ) 0.5 MG tablet Take 1 tablet (0.5 mg total) by mouth 3 (three) times daily as needed. 02/05/22  Yes [name], MD  amLODipine  (NORVASC ) 10 MG tablet TAKE 1 TABLET BY MOUTH EVERY DAY 10/23/22  Yes Jeffrie Oneil BROCKS, MD  atorvastatin  (LIPITOR) 20 MG tablet Take 1 tablet (20 mg total) by mouth daily. 10/24/22  Yes Jeffrie Oneil BROCKS, MD  azelastine (ASTELIN) 0.1 % nasal spray Place 1 spray into both nostrils at bedtime. 12/08/16  Yes [provider]  Blood Glucose Monitoring Suppl (ONETOUCH VERIO FLEX SYSTEM) w/Device KIT 1 each by Does not apply route daily at 12 noon. 07/14/20  Yes [name], MD  budesonide  (ENTOCORT EC ) 3 MG 24 hr capsule TAKE 3 CAPSULES (9 MG TOTAL) BY MOUTH DAILY. PLEASE TAKE FOR ONE MORE MONTH THEN DISCONTINUE. 02/08/23  Yes Aneita Gwendlyn DASEN, MD  budesonide -formoterol  (SYMBICORT) 160-4.5 MCG/ACT inhaler Inhale 2 puffs into the lungs 2 (two) times daily.   Yes [provider]  Cholecalciferol  (VITAMIN D -3) 5000 UNITS TABS Patient takes 1 tablet by mouth once a week on Mondays.   Yes [provider]  clopidogrel  (PLAVIX ) 75 MG  tablet TAKE 1 TABLET BY MOUTH DAILY. PLEASE CALL [phone] TO SCHEDULE AN OVERDUE APPOINTMENT WITH DR. MARK SKAINS FOR FUTURE REFILLS. THANK YOU. 2ND ATTEMPT. 11/07/22  Yes Jeffrie Oneil BROCKS, MD  clotrimazole -betamethasone  (LOTRISONE ) cream APPLY 1 APPLICATION TOPICALLY TWICE DAILY TO AFFECTED AREAS OF FEET AND ANKLES FOR 4 WEEKS 03/25/23  Yes McCaughan, Dia D, DPM  colchicine 0.6 MG tablet Take 0.6 mg by mouth 2 (two) times daily. 04/06/23  Yes [provider]  dicyclomine  (BENTYL ) 10 MG capsule Take 1 capsule (10 mg total) by mouth 3 (three) times daily as needed for spasms. 10/08/22  Yes Aneita Gwendlyn DASEN, MD  FARXIGA  10 MG TABS tablet TAKE 1 TABLET BY MOUTH EVERY DAY 04/15/23  Yes [name], MD  gabapentin  (NEURONTIN ) 300 MG capsule Take 1 capsule (300 mg total) by mouth 2 (two) times daily. 11/07/22  Yes [name], MD  glimepiride  (AMARYL ) 1 MG tablet TAKE 2 TABLETS (2 MG TOTAL) BY MOUTH DAILY WITH BREAKFAST 03/15/23  Yes [name], MD  HYDROcodone  bit-homatropine (HYCODAN) 5-1.5 MG/5ML syrup Take 5 mLs by mouth every 4 (four) hours as needed. 01/09/23  Yes [name], MD  HYDROcodone -acetaminophen  (NORCO) 10-325 MG tablet Take 1 tablet by mouth every 6 (six) hours as needed for moderate pain (pain score 4-6). 03/06/23  Yes [name], MD  JANUVIA  100 MG tablet TAKE 1 TABLET BY MOUTH EVERY DAY 05/02/23  Yes [name], MD  KLOR-CON  M20 20 MEQ tablet TAKE 1 TABLET BY MOUTH TWICE A DAY 08/29/22  Yes [name], MD  levocetirizine (XYZAL ) 5 MG tablet Take 5 mg by mouth every evening.   Yes [provider]  losartan  (COZAAR ) 25 MG tablet Take 1 tablet (25 mg total) by mouth daily. 10/01/22  Yes Swinyer, Rosaline HERO, NP  magnesium  oxide (MAG-OX) 400 MG tablet TAKE 3 TABLETS (1,200 MG TOTAL) BY MOUTH DAILY. 03/11/23  Yes [name], MD  metoprolol  succinate (TOPROL -XL) 100 MG 24 hr tablet TAKE 1 TABLET BY MOUTH DAILY. TAKE WITH OR IMMEDIATELY FOLLOWING A MEAL.  02/05/23  Yes [name], MD  montelukast  (SINGULAIR ) 10 MG tablet Take one every morning 10/28/18  Yes [name], MD  pantoprazole  (PROTONIX ) 40 MG tablet TAKE 1 TABLET BY MOUTH TWICE A DAY 04/15/23  Yes Aneita Gwendlyn DASEN, MD  tiZANidine  (ZANAFLEX ) 4 MG tablet Take 4 mg by mouth 2 (two) times daily. 03/17/23  Yes [provider]  torsemide  (DEMADEX ) 20 MG tablet TAKE 2 TABLETS BY MOUTH EVERY DAY 05/02/23  Yes [name], MD  Vitamin D , Ergocalciferol , (DRISDOL) 1.25 MG (50000 UNIT) CAPS capsule Take 50,000 Units by mouth every 14 (fourteen) days. 02/15/23  Yes [provider]  diphenoxylate -atropine  (LOMOTIL ) 2.5-0.025 MG tablet TAKE 1 TABLET BY MOUTH 2 TIMES DAILY AS NEEDED 03/05/23   Aneita Gwendlyn DASEN, MD  fluticasone  (FLONASE ) 50 MCG/ACT nasal spray PLACE 2 SPRAYS IN EACH NOSTRIL AT BEDTIME 07/26/15   [name], MD  HYDROcodone -acetaminophen  (NORCO) 10-325 MG tablet Take 1 tablet by mouth every 6 (six) hours as needed for moderate pain (pain score 4-6). 03/06/23   [name], MD  HYDROcodone -acetaminophen  (NORCO) 10-325 MG tablet Take 1 tablet by mouth every 6 (six) hours as needed for moderate pain (pain score 4-6). 03/06/23   [name], MD  hydrOXYzine  (ATARAX ) 10 MG tablet Take 10 mg by mouth every 6 (six) hours as needed.    [provider]  ketoconazole  (NIZORAL ) 2 % cream Apply 1 Application topically daily. Apply 1gm to bilateral foot  once daily 03/19/23   McCaughan, Dia D, DPM  levofloxacin  (LEVAQUIN ) 500 MG tablet Take 1 tablet (500 mg total) by mouth daily. 01/09/23   [name], MD  LIDODERM  5 % APPLY 1 PATCH TO SKIN FOR 12 HOURS THEN REMOVE AND DISCARD PATCH WITHIN 72 HOURS OR AS DIRECTED 03/10/13   Christi Glendia HERO, MD  loperamide (IMODIUM) 2 MG capsule Take 4-6 mg by mouth daily. Taking as needed only    [provider]  mesalamine  (LIALDA ) 1.2 g EC tablet Take 2 tablets (2.4 g total) by mouth 2 (two) times daily. 04/05/23    Aneita,  Gwendlyn DASEN, MD  neomycin -polymyxin-hydrocortisone  (CORTISPORIN) OTIC solution Place 4 drops into both ears 4 (four) times daily. 10/11/22   [name], MD  nitroGLYCERIN  (NITROSTAT ) 0.4 MG SL tablet PLACE 1 TABLET UNDER THE TONGUE EVERY 5 MINUTES AS NEEDED FOR CHEST PAIN. 12/27/22   Jeffrie Oneil BROCKS, MD  Olopatadine  HCl 0.7 % SOLN Place 1 drop into both eyes daily as needed (itching/allergies).    [provider]  OneTouch Delica Lancets 33G MISC 1 each by Does not apply route daily at 12 noon. 07/14/20   [name], MD  [facility] VERIO test strip USE TO CHECK BLOOD GLUCOSE ONCE DAILY 03/16/22   [name], MD  Pancrelipase , Lip-Prot-Amyl, (ZENPEP ) 40000-126000 units CPEP Take by mouth. Take 2 tablets by mouth before meals and 1 tablet before snacks    [provider]  Polyethyl Glycol-Propyl Glycol (SYSTANE OP) Place 1 drop into both eyes daily. For dry eyes    [provider]  TOBRADEX ophthalmic solution Place 1 drop into both eyes 4 (four) times daily. As needed 07/11/21   [provider]  ZENPEP  20000-63000 units CPEP TAKE 2 CAPSULES (40,000 UNITS) WITH MEALS AND 1 CAPSULE (20,000 UNITS) WITH A SNACK 02/25/23   Aneita Gwendlyn DASEN, MD    Family History Family History  Problem Relation Age of Onset   Pneumonia Father    Heart attack Father    Colon polyps Father    Parkinsonism Mother    Diabetes Brother    Sarcoidosis Brother    Hypertension Sister    Diabetes Sister    Breast cancer Daughter    Colon cancer Neg Hx    Esophageal cancer Neg Hx    Rectal cancer Neg Hx    Stomach cancer Neg Hx     Social History Social History   Tobacco Use   Smoking status: Former    Current packs/day: 0.00    Average packs/day: 2.0 packs/day for 15.0 years (30.0 ttl pk-yrs)    Types: Cigarettes    Start date: 04/30/1958    Quit date: 04/30/1973    Years since quitting: 50.0    Passive exposure: Past   Smokeless tobacco: Never   Tobacco comments:     ex-smoker: smoked for 10-15 years up to 2ppd, quit 1975.  Vaping Use   Vaping status: Never Used  Substance Use Topics   Alcohol  use: Not Currently    Alcohol /week: 0.0 standard drinks of alcohol     Comment: none now    Drug use: No     Allergies   Doxycycline, Aspirin , Azithromycin, Caffeine, Ciprofloxacin, Codeine, Oxycodone , Sulfamethizole, and Tramadol hcl   Review of Systems Review of Systems  Constitutional:  Negative for fever.  HENT:  Positive for ear pain. Negative for ear discharge and facial swelling.   Skin:  Negative for rash.  All other systems reviewed and are negative.    Physical Exam Triage Vital Signs ED Triage Vitals  Encounter Vitals Group     BP 05/14/23 1303 (!) 143/85     Systolic BP Percentile --      Diastolic BP Percentile --      Pulse Rate 05/14/23 1303 74     Resp 05/14/23 1303 18     Temp 05/14/23 1303 98.1 F (36.7 C)     Temp Source 05/14/23 1303 Oral     SpO2 05/14/23 1303 94 %     Weight 05/14/23 1259 220 lb 0.3 oz (99.8 kg)  Height 05/14/23 1259 5' (1.524 m)     Head Circumference --      Peak Flow --      Pain Score 05/14/23 1258 10     Pain Loc --      Pain Education --      Exclude from Growth Chart --    No data found.  Updated Vital Signs BP (!) 143/85 (BP Location: Left Arm)   Pulse 74   Temp 98.1 F (36.7 C) (Oral)   Resp 18   Ht 5' (1.524 m)   Wt 220 lb 0.3 oz (99.8 kg)   LMP  (LMP Unknown)   SpO2 94%   BMI 42.97 kg/m   Visual Acuity Right Eye Distance:   Left Eye Distance:   Bilateral Distance:    Right Eye Near:   Left Eye Near:    Bilateral Near:     Physical Exam Vitals and nursing note reviewed.  Constitutional:      Appearance: Normal appearance. She is well-developed and well-groomed.  HENT:     Head: Normocephalic.     Right Ear: Tympanic membrane normal.     Left Ear: Tympanic membrane normal.     Ears:     Comments: No mastoid tenderness or erythema, no trismus    Nose: Nose  normal.     Mouth/Throat:     Lips: Pink.     Mouth: Mucous membranes are moist.     Pharynx: Oropharynx is clear. Uvula midline.  Cardiovascular:     Rate and Rhythm: Normal rate.  Pulmonary:     Effort: Pulmonary effort is normal.     Breath sounds: Normal breath sounds and air entry.  Skin:    General: Skin is warm.     Capillary Refill: Capillary refill takes less than 2 seconds.     Findings: No rash.  Psychiatric:        Behavior: Behavior is cooperative.      UC Treatments / Results  Labs (all labs ordered are listed, but only abnormal results are displayed) Labs Reviewed - No data to display  EKG   Radiology No results found.  Procedures Procedures (including critical care time)  Medications Ordered in UC Medications - No data to display  Initial Impression / Assessment and Plan / UC Course  I have reviewed the triage vital signs and the nursing notes.  Pertinent labs & imaging results that were available during my care of the patient were reviewed by me and considered in my medical decision making (see chart for details).    Discussed exam findings and plan of care with patient, strict go to ER precautions given.   Patient verbalized understanding to this provider.  Ddx: Right otalgia,AOE,AOM, shingles, trigeminal neuralgia Final Clinical Impressions(s) / UC Diagnoses   Final diagnoses:  Acute otalgia, right     Discharge Instructions      May take tylenol  as label directed for pain, do not stick anything in ears, no water in ears. Keep appt as scheduled with ENT, I have also included  ENT in case they can see you sooner     ED Prescriptions   None    PDMP not reviewed this encounter.   Aminta Loose, NP 05/14/23 1614

## 2023-05-14 NOTE — ED Triage Notes (Signed)
 Symptoms onset before Christmas. Saw provider on 04/22/23. Was prescribed RX for 7 days antibiotics that the pt did complete. Symptoms did not change. Pt was referred to ENT but was not able to be scheduled until 05/30/23. Pt's son says she cannot wait until then do to discomfort.

## 2023-05-15 ENCOUNTER — Ambulatory Visit: Payer: PPO | Admitting: Family Medicine

## 2023-05-16 ENCOUNTER — Telehealth: Payer: Self-pay | Admitting: Gastroenterology

## 2023-05-16 MED ORDER — DIPHENOXYLATE-ATROPINE 2.5-0.025 MG PO TABS: ORAL_TABLET | ORAL | 1 refills | Status: DC

## 2023-05-16 NOTE — Telephone Encounter (Signed)
Spoke with patient's son Chrissie Noa) and informed him that we will refill the prescription and fax it to the pharmacy. Chrissie Noa verbalized understanding.

## 2023-05-16 NOTE — Telephone Encounter (Signed)
Good afternoon Dr. Leone Payor, This is a patient of Dr. Ardell Isaacs. Patient is requesting a refill of Lomotil. You are DOD this afternoon. Can we refill prescription?

## 2023-05-16 NOTE — Telephone Encounter (Signed)
OK to refill - I tried to do it but it printed not sure where - if you can do it go ahed w/ 4 RF's max before 09/29/23  If I need to do something else let me know

## 2023-05-16 NOTE — Telephone Encounter (Signed)
Inbound call from patient's son requesting a refill for Lomotil for patient. Please advise, thank you.

## 2023-05-18 DIAGNOSIS — I2609 Other pulmonary embolism with acute cor pulmonale: Secondary | ICD-10-CM | POA: Diagnosis not present

## 2023-05-18 DIAGNOSIS — J189 Pneumonia, unspecified organism: Secondary | ICD-10-CM | POA: Diagnosis not present

## 2023-05-18 DIAGNOSIS — J9601 Acute respiratory failure with hypoxia: Secondary | ICD-10-CM | POA: Diagnosis not present

## 2023-05-29 ENCOUNTER — Telehealth (INDEPENDENT_AMBULATORY_CARE_PROVIDER_SITE_OTHER): Payer: Self-pay | Admitting: Otolaryngology

## 2023-05-29 NOTE — Telephone Encounter (Signed)
Reminder Call: Date: 05/30/2023 Status: Sch  Time: 9:45 AM [address] [place], [place] [address]  Confirmed time and location w/patient.

## 2023-05-30 ENCOUNTER — Ambulatory Visit (INDEPENDENT_AMBULATORY_CARE_PROVIDER_SITE_OTHER): Payer: PPO | Admitting: Otolaryngology

## 2023-05-30 ENCOUNTER — Encounter (INDEPENDENT_AMBULATORY_CARE_PROVIDER_SITE_OTHER): Payer: Self-pay

## 2023-05-30 VITALS — BP 147/82 | Resp 19 | Wt 220.0 lb

## 2023-05-30 DIAGNOSIS — H6121 Impacted cerumen, right ear: Secondary | ICD-10-CM | POA: Diagnosis not present

## 2023-05-30 DIAGNOSIS — H9201 Otalgia, right ear: Secondary | ICD-10-CM | POA: Diagnosis not present

## 2023-05-30 DIAGNOSIS — H9190 Unspecified hearing loss, unspecified ear: Secondary | ICD-10-CM

## 2023-05-30 MED ORDER — OFLOXACIN 0.3 % OT SOLN: 4.0000 [drp] | Freq: Every day | OTIC | 1 refills | Status: AC

## 2023-05-30 MED ORDER — CLINDAMYCIN HCL 300 MG PO CAPS: 300.0000 mg | ORAL_CAPSULE | Freq: Three times a day (TID) | ORAL | 0 refills | Status: AC

## 2023-05-30 NOTE — Patient Instructions (Signed)
Use ofloxacin ear drops - 4 drops twice per day starting on Feburary 6 Use clindamycin antibiotic - 3 times per day - for 2 weeks

## 2023-05-30 NOTE — Progress Notes (Signed)
Dear Dr. Clent Ridges, Here is my assessment for our mutual patient, [name]. Thank you for allowing me the opportunity to care for your patient. Please do not hesitate to contact me should you have any other questions. Sincerely, Dr. Jovita Kussmaul  Otolaryngology Clinic Note Referring provider: Dr. Clent Ridges HPI:  [name] is a 83 y.o. female kindly referred by Dr. Clent Ridges for evaluation of right ear pain.  Initial visit (05/2023): Patient reports: right ear pain, fullness ("feeling like seashell over the ear") ongoing for at least a month. Right ear also itching a lot. Some hearing decline -- worse over past few months. She had some drainage as well (unclear color), but stopped stopped a few weeks ago (2-3). She has been using cortisporin drops - sparingly. Was prescribed levofloxacin but no improvement. Uses q-tips sparingly now after told stopped using them - does not report related to q-tip use. No URI or other antecedent event. No ear surgery, no recent audio  Patient denies: vertigo, tinnitus Patient additionally denies: deep pain in ear canal, eustachian tube symptoms such as popping/crackling, sensitive to pressure changes Patient also denies barotrauma, vestibular suppressant use, ototoxic medication use Prior ear surgery: no Ears not normally a problem. No recent ear infections or UR Does have diabetes, reports well controlled.  H&N Surgery: no Personal or FHx of bleeding dz or anesthesia difficulty: no   GLP-1: no AP/AC: Plavix  Tobacco: no current use but priro smoker, quit 1975  PMHx: Chronic respiratory failure, Asthma, Allergies, Chronic pain, pHTN, UC, Pancreatic Insufficiency, CHF, T2DM, CAD Independent Review of Additional Tests or Records:  Dr. Marlan Palau ([facility]) 04/22/2023 - right ear pain, drainage from ears (black-ish?); tried to remove cerumen but could not. Got Levofloxacin. ED visit (05/14/2023): Clancy Gourd, NP - noted right ear pain, no injury; no cerumen noted,  Dx: AOM; Rx: supportive, f/u with ENT HgA1c 10/11/2022: 6.8 CBC, and CMP 09/04/2022: WBC 11.2, Hgb 11.8, Plt 532, BUN/Cr wnl CTH 02/12/2021: independently reviewed, cuts thick but mastoids and ME well aerated; ossicles unremarkable, no erosive changes in EAC noted  PMH/Meds/All/SocHx/FamHx/ROS:   Past Medical History:  Diagnosis Date   Adenomatous colon polyp 02/2006   Allergy    Allergy, unspecified not elsewhere classified    Anemia    Anxiety    Arthritis    Asthma    Blood transfusion without reported diagnosis    Bronchiectasis    CAD (coronary artery disease)    BMS to the LAD, 2002; cath 12/07/11 patent LAD stent and mild nonobstructive disease, EF 65%; Medical management   Cataract    removed both eyes   CHF (congestive heart failure) (HCC)    Diastolic   Chronic diastolic CHF (congestive heart failure) (HCC) 11/25/2011   Clotting disorder (HCC)    in legs per pt    Diabetes mellitus    Diverticulosis of colon    DJD (degenerative joint disease)    Fibromyalgia    GERD (gastroesophageal reflux disease)    H. pylori infection 05/12/2019   Hypercholesterolemia    Hypertension    Microscopic hematuria    Neoplasm of kidney    Obesity    Other diseases of lung, not elsewhere classified    Pinched vertebral nerve    Pulmonary nodule    Negative PET in 2010   Stress incontinence, female    Syncope    Ulcerative colitis, left sided (HCC) 2008   Hx of   UTI (lower urinary tract infection)    Vitamin D  deficiency disease      Past Surgical History:  Procedure Laterality Date   ABDOMINAL HYSTERECTOMY     CARDIAC CATHETERIZATION  11/2011   CATARACT EXTRACTION, BILATERAL  2020   CHOLECYSTECTOMY     COLONOSCOPY  01/09/2018   per Dr. Russella Dar, adenomatous polyps, repeat in 3 yrs   CORONARY STENT PLACEMENT  2002   BMS to the LAD   ESOPHAGOGASTRODUODENOSCOPY  12/2008   HAND SURGERY  01/2006   Right Hand Surgery by Dr. Amanda Pea   LEFT HEART CATHETERIZATION WITH CORONARY  ANGIOGRAM N/A 12/07/2011   Procedure: LEFT HEART CATHETERIZATION WITH CORONARY ANGIOGRAM;  Surgeon: Peter M Swaziland, MD;  Location: [facility] CATH LAB;  Service: Cardiovascular;  Laterality: N/A;   RIGHT/LEFT HEART CATH AND CORONARY ANGIOGRAPHY N/A 08/07/2021   Procedure: RIGHT/LEFT HEART CATH AND CORONARY ANGIOGRAPHY;  Surgeon: Dolores Patty, MD;  Location: [facility] INVASIVE CV LAB;  Service: Cardiovascular;  Laterality: N/A;   UPPER GASTROINTESTINAL ENDOSCOPY      Family History  Problem Relation Age of Onset   Pneumonia Father    Heart attack Father    Colon polyps Father    Parkinsonism Mother    Diabetes Brother    Sarcoidosis Brother    Hypertension Sister    Diabetes Sister    Breast cancer Daughter    Colon cancer Neg Hx    Esophageal cancer Neg Hx    Rectal cancer Neg Hx    Stomach cancer Neg Hx      Social Connections: Socially Integrated (05/31/2023)   Social Connection and Isolation Panel [NHANES]    Frequency of Communication with Friends and Family: More than three times a week    Frequency of Social Gatherings with Friends and Family: More than three times a week    Attends Religious Services: More than 4 times per year    Active Member of [employer] or Organizations: Yes    Attends Engineer, structural: More than 4 times per year    Marital Status: Married      Current Outpatient Medications:    acetaminophen (TYLENOL) 500 MG tablet, Take 500 mg by mouth every 6 (six) hours as needed for headache (pain). , Disp: , Rfl:    albuterol (PROVENTIL) (2.5 MG/3ML) 0.083% nebulizer solution, Take 3 mLs (2.5 mg total) by nebulization every 4 (four) hours as needed for wheezing or shortness of breath., Disp: 75 mL, Rfl: 3   albuterol (VENTOLIN HFA) 108 (90 BASE) MCG/ACT inhaler, Inhale 2 puffs into the lungs every 4 (four) hours as needed for wheezing or shortness of breath., Disp: 18 each, Rfl: 11   ALPRAZolam (XANAX) 0.5 MG tablet, Take 1 tablet (0.5 mg total) by mouth 3  (three) times daily as needed., Disp: 90 tablet, Rfl: 5   amLODipine (NORVASC) 10 MG tablet, TAKE 1 TABLET BY MOUTH EVERY DAY, Disp: 90 tablet, Rfl: 3   atorvastatin (LIPITOR) 20 MG tablet, Take 1 tablet (20 mg total) by mouth daily., Disp: 90 tablet, Rfl: 3   azelastine (ASTELIN) 0.1 % nasal spray, Place 1 spray into both nostrils at bedtime., Disp: , Rfl:    Blood Glucose Monitoring Suppl (ONETOUCH VERIO FLEX SYSTEM) w/Device KIT, 1 each by Does not apply route daily at 12 noon., Disp: 1 kit, Rfl: 0   budesonide (ENTOCORT EC) 3 MG 24 hr capsule, TAKE 3 CAPSULES (9 MG TOTAL) BY MOUTH DAILY. PLEASE TAKE FOR ONE MORE MONTH THEN DISCONTINUE., Disp: 270 capsule, Rfl: 1   budesonide-formoterol (SYMBICORT) 160-4.5  MCG/ACT inhaler, Inhale 2 puffs into the lungs 2 (two) times daily., Disp: , Rfl:    Cholecalciferol (VITAMIN D-3) 5000 UNITS TABS, Patient takes 1 tablet by mouth once a week on Mondays., Disp: , Rfl:    clindamycin (CLEOCIN) 300 MG capsule, Take 1 capsule (300 mg total) by mouth 3 (three) times daily for 14 days., Disp: 42 capsule, Rfl: 0   clopidogrel (PLAVIX) 75 MG tablet, TAKE 1 TABLET BY MOUTH DAILY. PLEASE CALL [phone] TO SCHEDULE AN OVERDUE APPOINTMENT WITH DR. MARK SKAINS FOR FUTURE REFILLS. THANK YOU. 2ND ATTEMPT., Disp: 90 tablet, Rfl: 3   clotrimazole-betamethasone (LOTRISONE) cream, APPLY 1 APPLICATION TOPICALLY TWICE DAILY TO AFFECTED AREAS OF FEET AND ANKLES FOR 4 WEEKS, Disp: 45 g, Rfl: 3   colchicine 0.6 MG tablet, Take 0.6 mg by mouth 2 (two) times daily., Disp: , Rfl:    dicyclomine (BENTYL) 10 MG capsule, Take 1 capsule (10 mg total) by mouth 3 (three) times daily as needed for spasms., Disp: 90 capsule, Rfl: 3   diphenoxylate-atropine (LOMOTIL) 2.5-0.025 MG tablet, TAKE 1 TABLET BY MOUTH 2 TIMES DAILY AS NEEDED, Disp: 60 tablet, Rfl: 1   FARXIGA 10 MG TABS tablet, TAKE 1 TABLET BY MOUTH EVERY DAY, Disp: 30 tablet, Rfl: 5   fluticasone (FLONASE) 50 MCG/ACT nasal spray,  PLACE 2 SPRAYS IN EACH NOSTRIL AT BEDTIME, Disp: 16 g, Rfl: 6   gabapentin (NEURONTIN) 300 MG capsule, Take 1 capsule (300 mg total) by mouth 2 (two) times daily., Disp: 60 capsule, Rfl: 11   glimepiride (AMARYL) 1 MG tablet, TAKE 2 TABLETS (2 MG TOTAL) BY MOUTH DAILY WITH BREAKFAST, Disp: 180 tablet, Rfl: 0   HYDROcodone bit-homatropine (HYCODAN) 5-1.5 MG/5ML syrup, Take 5 mLs by mouth every 4 (four) hours as needed., Disp: 240 mL, Rfl: 0   HYDROcodone-acetaminophen (NORCO) 10-325 MG tablet, Take 1 tablet by mouth every 6 (six) hours as needed for moderate pain (pain score 4-6)., Disp: 120 tablet, Rfl: 0   HYDROcodone-acetaminophen (NORCO) 10-325 MG tablet, Take 1 tablet by mouth every 6 (six) hours as needed for moderate pain (pain score 4-6)., Disp: 120 tablet, Rfl: 0   HYDROcodone-acetaminophen (NORCO) 10-325 MG tablet, Take 1 tablet by mouth every 6 (six) hours as needed for moderate pain (pain score 4-6)., Disp: 120 tablet, Rfl: 0   hydrOXYzine (ATARAX) 10 MG tablet, Take 10 mg by mouth every 6 (six) hours as needed., Disp: , Rfl:    JANUVIA 100 MG tablet, TAKE 1 TABLET BY MOUTH EVERY DAY, Disp: 90 tablet, Rfl: 3   ketoconazole (NIZORAL) 2 % cream, Apply 1 Application topically daily. Apply 1gm to bilateral foot  once daily, Disp: 60 g, Rfl: 2   KLOR-CON M20 20 MEQ tablet, TAKE 1 TABLET BY MOUTH TWICE A DAY, Disp: 180 tablet, Rfl: 3   levocetirizine (XYZAL) 5 MG tablet, Take 5 mg by mouth every evening., Disp: , Rfl:    levofloxacin (LEVAQUIN) 500 MG tablet, Take 1 tablet (500 mg total) by mouth daily., Disp: 10 tablet, Rfl: 0   LIDODERM 5 %, APPLY 1 PATCH TO SKIN FOR 12 HOURS THEN REMOVE AND DISCARD PATCH WITHIN 72 HOURS OR AS DIRECTED, Disp: 30 patch, Rfl: 2   loperamide (IMODIUM) 2 MG capsule, Take 4-6 mg by mouth daily. Taking as needed only, Disp: , Rfl:    losartan (COZAAR) 25 MG tablet, Take 1 tablet (25 mg total) by mouth daily., Disp: 90 tablet, Rfl: 3   magnesium oxide (MAG-OX) 400 MG  tablet, TAKE 3 TABLETS (1,200 MG TOTAL) BY MOUTH DAILY., Disp: 90 tablet, Rfl: 5   mesalamine (LIALDA) 1.2 g EC tablet, Take 2 tablets (2.4 g total) by mouth 2 (two) times daily., Disp: 360 tablet, Rfl: 1   metoprolol succinate (TOPROL-XL) 100 MG 24 hr tablet, TAKE 1 TABLET BY MOUTH DAILY. TAKE WITH OR IMMEDIATELY FOLLOWING A MEAL., Disp: 90 tablet, Rfl: 1   montelukast (SINGULAIR) 10 MG tablet, Take one every morning, Disp: 90 tablet, Rfl: 3   nitroGLYCERIN (NITROSTAT) 0.4 MG SL tablet, PLACE 1 TABLET UNDER THE TONGUE EVERY 5 MINUTES AS NEEDED FOR CHEST PAIN., Disp: 75 tablet, Rfl: 2   [START ON 06/06/2023] ofloxacin (FLOXIN) 0.3 % OTIC solution, Place 4 drops into the right ear daily for 14 days., Disp: 10 mL, Rfl: 1   Olopatadine HCl 0.7 % SOLN, Place 1 drop into both eyes daily as needed (itching/allergies)., Disp: , Rfl:    OneTouch Delica Lancets 33G MISC, 1 each by Does not apply route daily at 12 noon., Disp: 100 each, Rfl: 0   ONETOUCH VERIO test strip, USE TO CHECK BLOOD GLUCOSE ONCE DAILY, Disp: 100 strip, Rfl: 1   Pancrelipase, Lip-Prot-Amyl, (ZENPEP) 40000-126000 units CPEP, Take by mouth. Take 2 tablets by mouth before meals and 1 tablet before snacks, Disp: , Rfl:    pantoprazole (PROTONIX) 40 MG tablet, TAKE 1 TABLET BY MOUTH TWICE A DAY, Disp: 180 tablet, Rfl: 0   Polyethyl Glycol-Propyl Glycol (SYSTANE OP), Place 1 drop into both eyes daily. For dry eyes, Disp: , Rfl:    tiZANidine (ZANAFLEX) 4 MG tablet, Take 4 mg by mouth 2 (two) times daily., Disp: , Rfl:    torsemide (DEMADEX) 20 MG tablet, TAKE 2 TABLETS BY MOUTH EVERY DAY, Disp: 180 tablet, Rfl: 0   Vitamin D, Ergocalciferol, (DRISDOL) 1.25 MG (50000 UNIT) CAPS capsule, Take 50,000 Units by mouth every 14 (fourteen) days., Disp: , Rfl:    ZENPEP 20000-63000 units CPEP, TAKE 2 CAPSULES (40,000 UNITS) WITH MEALS AND 1 CAPSULE (20,000 UNITS) WITH A SNACK, Disp: 240 capsule, Rfl: 1  Current Facility-Administered Medications:     ipratropium-albuterol (DUONEB) 0.5-2.5 (3) MG/3ML nebulizer solution 3 mL, 3 mL, Nebulization, Once, Nafziger, Cory, NP   Physical Exam:   BP (!) 147/82 (BP Location: Right Arm, Patient Position: Sitting, Cuff Size: Normal)   Resp 19   Wt 220 lb (99.8 kg)   LMP  (LMP Unknown)   SpO2 90%   BMI 42.97 kg/m   Salient findings:  CN II-XII intact Given history and complaints, ear microscopy was indicated and performed for evaluation with findings as below in physical exam section and in procedures. Left EAC clear and TM intact with well pneumatized middle ear space; Right EAC cerumen impaction; after clearance, noted to have small tender area anterior bony canal (not granulation it apperas but small polypoid area) right over where cerumen impaction was - query from qtip use(?) or trauma(?); distal to it, canal is intact no exposed bone, TM intact and ME well aerated; canal filled with mupirocin and steroid ointment Weber 512: mid Rinne 512: AC > BC b/l  Anterior rhinoscopy: Septum intact; bilateral inferior turbinates without significant hypertrophy No lesions of oral cavity/oropharynx No obviously palpable neck masses/lymphadenopathy/thyromegaly No respiratory distress or stridor  Seprately Identifiable Procedures:  Procedure: Bilateral ear microscopy and cerumen removal using microscope (CPT [id number]) - Mod 25 Pre-procedure diagnosis: Cerumen impaction right external ears Post-procedure diagnosis: same Indication: right ear cerumen impaction; given patient's otologic  complaints and history as well as for improved and comprehensive examination of external ear and tympanic membrane, bilateral otologic examination using microscope was performed and impacted cerumen removed  Procedure: Patient was placed semi-recumbent. Both ear canals were examined using the microscope with findings above. Cerumen removed on right using suction and currette with improvement in EAC examination and patency. Findings  as above Patient tolerated the procedure well.  Impression & Plans:  [name] is a 83 y.o. female with h/o T2DM now with:  1. Acute otalgia, right   2. Impacted cerumen of right ear   3. Subjective hearing loss    Ongoing for at least 6 weeks, small polypoid area over right bony canal noted which is tender; DDX is wide and though does not appear to be granulation, MOE in differential; carcinoma as well but unlikely; trauma lreated lesion also possible 2/2 qtip use Discussed options, and will treat aggressively given diabetes  Ofloxacin BID x14d (start in 1 week) Clindamycin TID x14d  F/u 3-4 weeks with audiogram  See below regarding exact medications prescribed this encounter including dosages and route: Meds ordered this encounter  Medications   ofloxacin (FLOXIN) 0.3 % OTIC solution    Sig: Place 4 drops into the right ear daily for 14 days.    Dispense:  10 mL    Refill:  1   clindamycin (CLEOCIN) 300 MG capsule    Sig: Take 1 capsule (300 mg total) by mouth 3 (three) times daily for 14 days.    Dispense:  42 capsule    Refill:  0      Thank you for allowing me the opportunity to care for your patient. Please do not hesitate to contact me should you have any other questions.  Sincerely, Jovita Kussmaul, MD Otolaryngologist (ENT), [facility] Phone: [phone] Fax: [phone]  06/02/2023, 11:47 AM   MDM:  Level 4 Complexity/Problems addressed: mod - acute and chronic problem Data complexity: mod- independent review of notes, labs; independent CT interp - Morbidity: mod  - Prescription Drug prescribed or managed: yes

## 2023-05-31 ENCOUNTER — Ambulatory Visit (INDEPENDENT_AMBULATORY_CARE_PROVIDER_SITE_OTHER): Payer: PPO

## 2023-05-31 VITALS — Ht 60.0 in | Wt 217.0 lb

## 2023-05-31 DIAGNOSIS — Z0000 Encounter for general adult medical examination without abnormal findings: Secondary | ICD-10-CM

## 2023-05-31 NOTE — Progress Notes (Signed)
Subjective:   [name] is a 83 y.o. female who presents for Medicare Annual (Subsequent) preventive examination.  Visit Complete: Virtual I connected with  [name] on 05/31/23 by a audio enabled telemedicine application and verified that I am speaking with the correct person using two identifiers.  Patient Location: Home  Provider Location: Home Office  I discussed the limitations of evaluation and management by telemedicine. The patient expressed understanding and agreed to proceed.  Vital Signs: Because this visit was a virtual/telehealth visit, some criteria may be missing or patient reported. Any vitals not documented were not able to be obtained and vitals that have been documented are patient reported.      Objective:    Today's Vitals   05/31/23 0811  Weight: 217 lb (98.4 kg)  Height: 5' (1.524 m)  PainSc: 0-No pain   Body mass index is 42.38 kg/m.     05/31/2023    8:24 AM 05/24/2022    9:15 AM 05/22/2021    9:15 AM 05/11/2020    9:54 AM 06/08/2019   12:51 PM 12/16/2016    8:06 PM 12/16/2016    2:35 PM  Advanced Directives  Does Patient Have a Medical Advance Directive? No No No No No No No  Does patient want to make changes to medical advance directive?   No - Patient declined      Would patient like information on creating a medical advance directive? No - Patient declined No - Patient declined  No - Patient declined  No - Patient declined     Current Medications (verified) Outpatient Encounter Medications as of 05/31/2023  Medication Sig   acetaminophen (TYLENOL) 500 MG tablet Take 500 mg by mouth every 6 (six) hours as needed for headache (pain).    albuterol (PROVENTIL) (2.5 MG/3ML) 0.083% nebulizer solution Take 3 mLs (2.5 mg total) by nebulization every 4 (four) hours as needed for wheezing or shortness of breath.   albuterol (VENTOLIN HFA) 108 (90 BASE) MCG/ACT inhaler Inhale 2 puffs into the lungs every 4 (four) hours as needed for wheezing or  shortness of breath.   ALPRAZolam (XANAX) 0.5 MG tablet Take 1 tablet (0.5 mg total) by mouth 3 (three) times daily as needed.   amLODipine (NORVASC) 10 MG tablet TAKE 1 TABLET BY MOUTH EVERY DAY   atorvastatin (LIPITOR) 20 MG tablet Take 1 tablet (20 mg total) by mouth daily.   azelastine (ASTELIN) 0.1 % nasal spray Place 1 spray into both nostrils at bedtime.   Blood Glucose Monitoring Suppl (ONETOUCH VERIO FLEX SYSTEM) w/Device KIT 1 each by Does not apply route daily at 12 noon.   budesonide (ENTOCORT EC) 3 MG 24 hr capsule TAKE 3 CAPSULES (9 MG TOTAL) BY MOUTH DAILY. PLEASE TAKE FOR ONE MORE MONTH THEN DISCONTINUE.   budesonide-formoterol (SYMBICORT) 160-4.5 MCG/ACT inhaler Inhale 2 puffs into the lungs 2 (two) times daily.   Cholecalciferol (VITAMIN D-3) 5000 UNITS TABS Patient takes 1 tablet by mouth once a week on Mondays.   clindamycin (CLEOCIN) 300 MG capsule Take 1 capsule (300 mg total) by mouth 3 (three) times daily for 14 days.   clopidogrel (PLAVIX) 75 MG tablet TAKE 1 TABLET BY MOUTH DAILY. PLEASE CALL [phone] TO SCHEDULE AN OVERDUE APPOINTMENT WITH DR. MARK SKAINS FOR FUTURE REFILLS. THANK YOU. 2ND ATTEMPT.   clotrimazole-betamethasone (LOTRISONE) cream APPLY 1 APPLICATION TOPICALLY TWICE DAILY TO AFFECTED AREAS OF FEET AND ANKLES FOR 4 WEEKS   colchicine 0.6 MG tablet Take 0.6 mg by  mouth 2 (two) times daily.   dicyclomine (BENTYL) 10 MG capsule Take 1 capsule (10 mg total) by mouth 3 (three) times daily as needed for spasms.   diphenoxylate-atropine (LOMOTIL) 2.5-0.025 MG tablet TAKE 1 TABLET BY MOUTH 2 TIMES DAILY AS NEEDED   FARXIGA 10 MG TABS tablet TAKE 1 TABLET BY MOUTH EVERY DAY   fluticasone (FLONASE) 50 MCG/ACT nasal spray PLACE 2 SPRAYS IN EACH NOSTRIL AT BEDTIME   gabapentin (NEURONTIN) 300 MG capsule Take 1 capsule (300 mg total) by mouth 2 (two) times daily.   glimepiride (AMARYL) 1 MG tablet TAKE 2 TABLETS (2 MG TOTAL) BY MOUTH DAILY WITH BREAKFAST    HYDROcodone bit-homatropine (HYCODAN) 5-1.5 MG/5ML syrup Take 5 mLs by mouth every 4 (four) hours as needed.   HYDROcodone-acetaminophen (NORCO) 10-325 MG tablet Take 1 tablet by mouth every 6 (six) hours as needed for moderate pain (pain score 4-6).   HYDROcodone-acetaminophen (NORCO) 10-325 MG tablet Take 1 tablet by mouth every 6 (six) hours as needed for moderate pain (pain score 4-6).   HYDROcodone-acetaminophen (NORCO) 10-325 MG tablet Take 1 tablet by mouth every 6 (six) hours as needed for moderate pain (pain score 4-6).   hydrOXYzine (ATARAX) 10 MG tablet Take 10 mg by mouth every 6 (six) hours as needed.   JANUVIA 100 MG tablet TAKE 1 TABLET BY MOUTH EVERY DAY   ketoconazole (NIZORAL) 2 % cream Apply 1 Application topically daily. Apply 1gm to bilateral foot  once daily   KLOR-CON M20 20 MEQ tablet TAKE 1 TABLET BY MOUTH TWICE A DAY   levocetirizine (XYZAL) 5 MG tablet Take 5 mg by mouth every evening.   levofloxacin (LEVAQUIN) 500 MG tablet Take 1 tablet (500 mg total) by mouth daily.   LIDODERM 5 % APPLY 1 PATCH TO SKIN FOR 12 HOURS THEN REMOVE AND DISCARD PATCH WITHIN 72 HOURS OR AS DIRECTED   loperamide (IMODIUM) 2 MG capsule Take 4-6 mg by mouth daily. Taking as needed only   losartan (COZAAR) 25 MG tablet Take 1 tablet (25 mg total) by mouth daily.   magnesium oxide (MAG-OX) 400 MG tablet TAKE 3 TABLETS (1,200 MG TOTAL) BY MOUTH DAILY.   mesalamine (LIALDA) 1.2 g EC tablet Take 2 tablets (2.4 g total) by mouth 2 (two) times daily.   metoprolol succinate (TOPROL-XL) 100 MG 24 hr tablet TAKE 1 TABLET BY MOUTH DAILY. TAKE WITH OR IMMEDIATELY FOLLOWING A MEAL.   montelukast (SINGULAIR) 10 MG tablet Take one every morning   nitroGLYCERIN (NITROSTAT) 0.4 MG SL tablet PLACE 1 TABLET UNDER THE TONGUE EVERY 5 MINUTES AS NEEDED FOR CHEST PAIN.   [START ON 06/06/2023] ofloxacin (FLOXIN) 0.3 % OTIC solution Place 4 drops into the right ear daily for 14 days.   Olopatadine HCl 0.7 % SOLN Place 1  drop into both eyes daily as needed (itching/allergies).   OneTouch Delica Lancets 33G MISC 1 each by Does not apply route daily at 12 noon.   ONETOUCH VERIO test strip USE TO CHECK BLOOD GLUCOSE ONCE DAILY   Pancrelipase, Lip-Prot-Amyl, (ZENPEP) 40000-126000 units CPEP Take by mouth. Take 2 tablets by mouth before meals and 1 tablet before snacks   pantoprazole (PROTONIX) 40 MG tablet TAKE 1 TABLET BY MOUTH TWICE A DAY   Polyethyl Glycol-Propyl Glycol (SYSTANE OP) Place 1 drop into both eyes daily. For dry eyes   tiZANidine (ZANAFLEX) 4 MG tablet Take 4 mg by mouth 2 (two) times daily.   torsemide (DEMADEX) 20 MG tablet TAKE  2 TABLETS BY MOUTH EVERY DAY   Vitamin D, Ergocalciferol, (DRISDOL) 1.25 MG (50000 UNIT) CAPS capsule Take 50,000 Units by mouth every 14 (fourteen) days.   ZENPEP 20000-63000 units CPEP TAKE 2 CAPSULES (40,000 UNITS) WITH MEALS AND 1 CAPSULE (20,000 UNITS) WITH A SNACK   Facility-Administered Encounter Medications as of 05/31/2023  Medication   ipratropium-albuterol (DUONEB) 0.5-2.5 (3) MG/3ML nebulizer solution 3 mL    Allergies (verified) Doxycycline, Aspirin, Azithromycin, Caffeine, Ciprofloxacin, Codeine, Oxycodone, Sulfamethizole, and Tramadol hcl   History: Past Medical History:  Diagnosis Date   Adenomatous colon polyp 02/2006   Allergy    Allergy, unspecified not elsewhere classified    Anemia    Anxiety    Arthritis    Asthma    Blood transfusion without reported diagnosis    Bronchiectasis    CAD (coronary artery disease)    BMS to the LAD, 2002; cath 12/07/11 patent LAD stent and mild nonobstructive disease, EF 65%; Medical management   Cataract    removed both eyes   CHF (congestive heart failure) (HCC)    Diastolic   Chronic diastolic CHF (congestive heart failure) (HCC) 11/25/2011   Clotting disorder (HCC)    in legs per pt    Diabetes mellitus    Diverticulosis of colon    DJD (degenerative joint disease)    Fibromyalgia    GERD  (gastroesophageal reflux disease)    H. pylori infection 05/12/2019   Hypercholesterolemia    Hypertension    Microscopic hematuria    Neoplasm of kidney    Obesity    Other diseases of lung, not elsewhere classified    Pinched vertebral nerve    Pulmonary nodule    Negative PET in 2010   Stress incontinence, female    Syncope    Ulcerative colitis, left sided (HCC) 2008   Hx of   UTI (lower urinary tract infection)    Vitamin D deficiency disease    Past Surgical History:  Procedure Laterality Date   ABDOMINAL HYSTERECTOMY     CARDIAC CATHETERIZATION  11/2011   CATARACT EXTRACTION, BILATERAL  2020   CHOLECYSTECTOMY     COLONOSCOPY  01/09/2018   per Dr. Russella Dar, adenomatous polyps, repeat in 3 yrs   CORONARY STENT PLACEMENT  2002   BMS to the LAD   ESOPHAGOGASTRODUODENOSCOPY  12/2008   HAND SURGERY  01/2006   Right Hand Surgery by Dr. Amanda Pea   LEFT HEART CATHETERIZATION WITH CORONARY ANGIOGRAM N/A 12/07/2011   Procedure: LEFT HEART CATHETERIZATION WITH CORONARY ANGIOGRAM;  Surgeon: Peter M Swaziland, MD;  Location: [facility] CATH LAB;  Service: Cardiovascular;  Laterality: N/A;   RIGHT/LEFT HEART CATH AND CORONARY ANGIOGRAPHY N/A 08/07/2021   Procedure: RIGHT/LEFT HEART CATH AND CORONARY ANGIOGRAPHY;  Surgeon: Dolores Patty, MD;  Location: [facility] INVASIVE CV LAB;  Service: Cardiovascular;  Laterality: N/A;   UPPER GASTROINTESTINAL ENDOSCOPY     Family History  Problem Relation Age of Onset   Pneumonia Father    Heart attack Father    Colon polyps Father    Parkinsonism Mother    Diabetes Brother    Sarcoidosis Brother    Hypertension Sister    Diabetes Sister    Breast cancer Daughter    Colon cancer Neg Hx    Esophageal cancer Neg Hx    Rectal cancer Neg Hx    Stomach cancer Neg Hx    Social History   Socioeconomic History   Marital status: Divorced    Spouse name: Not on  file   Number of children: 3   Years of education: Not on file   Highest education level: Not on  file  Occupational History   Occupation: Retired    Associate Professor: RETIRED  Tobacco Use   Smoking status: Former    Current packs/day: 0.00    Average packs/day: 2.0 packs/day for 15.0 years (30.0 ttl pk-yrs)    Types: Cigarettes    Start date: 04/30/1958    Quit date: 04/30/1973    Years since quitting: 50.1    Passive exposure: Past   Smokeless tobacco: Never   Tobacco comments:    ex-smoker: smoked for 10-15 years up to 2ppd, quit 1975.  Vaping Use   Vaping status: Never Used  Substance and Sexual Activity   Alcohol use: Not Currently    Alcohol/week: 0.0 standard drinks of alcohol    Comment: none now    Drug use: No   Sexual activity: Not Currently  Other Topics Concern   Not on file  Social History Narrative   Right handed   Social Drivers of Health   Financial Resource Strain: Low Risk  (05/31/2023)   Overall Financial Resource Strain (CARDIA)    Difficulty of Paying Living Expenses: Not hard at all  Food Insecurity: No Food Insecurity (05/31/2023)   Hunger Vital Sign    Worried About Running Out of Food in the Last Year: Never true    Ran Out of Food in the Last Year: Never true  Transportation Needs: No Transportation Needs (05/31/2023)   PRAPARE - Administrator, Civil Service (Medical): No    Lack of Transportation (Non-Medical): No  Physical Activity: Inactive (05/31/2023)   Exercise Vital Sign    Days of Exercise per Week: 0 days    Minutes of Exercise per Session: 0 min  Stress: No Stress Concern Present (05/31/2023)   [employer] of Occupational Health - Occupational Stress Questionnaire    Feeling of Stress : Not at all  Social Connections: Socially Integrated (05/31/2023)   Social Connection and Isolation Panel [NHANES]    Frequency of Communication with Friends and Family: More than three times a week    Frequency of Social Gatherings with Friends and Family: More than three times a week    Attends Religious Services: More than 4 times per year     Active Member of [employer] or Organizations: Yes    Attends Engineer, structural: More than 4 times per year    Marital Status: Married    Tobacco Counseling Counseling given: Not Answered Tobacco comments: ex-smoker: smoked for 10-15 years up to 2ppd, quit 1975.   Clinical Intake:  Pre-visit preparation completed: Yes  Pain : No/denies pain Pain Score: 0-No pain Pain Location: Arm Pain Orientation: Left Pain Descriptors / Indicators: Jabbing Pain Frequency: Intermittent Effect of Pain on Daily Activities: Effects daily activities     BMI - recorded: 42.38 Nutritional Status: BMI > 30  Obese Nutritional Risks: None Diabetes: Yes CBG done?: No Did pt. bring in CBG monitor from home?: No  How often do you need to have someone help you when you read instructions, pamphlets, or other written materials from your doctor or pharmacy?: 3 - Sometimes (Son Assist)  Interpreter Needed?: No  Information entered by :: Theresa Mulligan LPN   Activities of Daily Living    05/31/2023    8:20 AM  In your present state of health, do you have any difficulty performing the following activities:  Hearing? 0  Vision? 0  Difficulty concentrating or making decisions? 0  Walking or climbing stairs? 0  Dressing or bathing? 0  Doing errands, shopping? 0  Preparing Food and eating ? N  Using the Toilet? N  In the past six months, have you accidently leaked urine? Y  Comment Wears Depends. Followed by Urologist  Do you have problems with loss of bowel control? Y  Comment Followed by Gastrologist  Managing your Medications? N  Comment Son Assist  Managing your Finances? Y  Comment Son Product/process development scientist or managing your Housekeeping? Y  Comment Aide assist    Patient Care Team: Nelwyn Salisbury, MD as PCP - General (Family Medicine) Jake Bathe, MD as PCP - Cardiology (Cardiology) Drema Dallas, DO as Consulting Physician (Neurology) Sherrill Raring, [facility]  (Pharmacist)  Indicate any recent Medical Services you may have received from other than [facility] providers in the past year (date may be approximate).     Assessment:   This is a routine wellness examination for Jozalynn.  Hearing/Vision screen Hearing Screening - Comments:: Denies hearing difficulties   Vision Screening - Comments:: Wears rx glasses - up to date with routine eye exams with  [facility]   Goals Addressed   None    Depression Screen    05/31/2023    8:20 AM 05/24/2022    9:10 AM 08/21/2021    3:10 PM 06/16/2021   10:21 AM 05/22/2021    9:06 AM 05/11/2020    9:56 AM 02/06/2018    9:01 AM  PHQ 2/9 Scores  PHQ - 2 Score 0 0 0 1 0 0 0  PHQ- 9 Score    9  0     Fall Risk    05/31/2023    8:23 AM 05/24/2022    9:14 AM 08/21/2021    3:09 PM 06/16/2021   10:20 AM 05/22/2021    9:11 AM  Fall Risk   Falls in the past year? 0 1 1 1  0  Number falls in past yr: 0 0 1 1 0  Injury with Fall? 0 0 0 0 0  Risk for fall due to : No Fall Risks No Fall Risks History of fall(s);Impaired balance/gait    Follow up Falls prevention discussed Falls prevention discussed Falls evaluation completed;Falls prevention discussed Falls evaluation completed     MEDICARE RISK AT HOME: Medicare Risk at Home Any stairs in or around the home?: Yes If so, are there any without handrails?: No Home free of loose throw rugs in walkways, pet beds, electrical cords, etc?: Yes Adequate lighting in your home to reduce risk of falls?: Yes Life alert?: No Use of a cane, walker or w/c?: Yes Grab bars in the bathroom?: Yes Shower chair or bench in shower?: Yes Elevated toilet seat or a handicapped toilet?: Yes  TIMED UP AND GO:  Was the test performed?  No    Cognitive Function:        05/31/2023    8:24 AM 05/24/2022    9:15 AM 05/22/2021    9:12 AM 05/11/2020    9:59 AM  6CIT Screen  What Year? 4 points 0 points 0 points 0 points  What month? 0 points 0 points 0 points 0 points  What time? 0  points 0 points 0 points 0 points  Count back from 20 0 points 0 points 0 points 0 points  Months in reverse 0 points 0 points 0 points 2 points  Repeat phrase 0  points 0 points 0 points 4 points  Total Score 4 points 0 points 0 points 6 points    Immunizations Immunization History  Administered Date(s) Administered   Influenza, High Dose Seasonal PF 02/17/2019   PFIZER(Purple Top)SARS-COV-2 Vaccination 03/17/2020, 04/09/2020   Pneumococcal Conjugate-13 03/15/2015   Pneumococcal Polysaccharide-23 05/21/2006, 02/23/2015, 05/21/2016, 08/14/2018, 02/17/2019, 03/17/2020, 10/17/2020    TDAP status: Due, Education has been provided regarding the importance of this vaccine. Advised may receive this vaccine at local pharmacy or Health Dept. Aware to provide a copy of the vaccination record if obtained from local pharmacy or Health Dept. Verbalized acceptance and understanding.    Pneumococcal vaccine status: Up to date  Covid-19 vaccine status: Declined, Education has been provided regarding the importance of this vaccine but patient still declined. Advised may receive this vaccine at local pharmacy or Health Dept.or vaccine clinic. Aware to provide a copy of the vaccination record if obtained from local pharmacy or Health Dept. Verbalized acceptance and understanding.  Qualifies for Shingles Vaccine? Yes   Zostavax completed No   Shingrix Completed?: No.    Education has been provided regarding the importance of this vaccine. Patient has been advised to call insurance company to determine out of pocket expense if they have not yet received this vaccine. Advised may also receive vaccine at local pharmacy or Health Dept. Verbalized acceptance and understanding.  Screening Tests Health Maintenance  Topic Date Due   Diabetic kidney evaluation - Urine ACR  Never done   DTaP/Tdap/Td (1 - Tdap) Never done   Zoster Vaccines- Shingrix (1 of 2) Never done   FOOT EXAM  07/28/2019   OPHTHALMOLOGY  EXAM  08/19/2020   Colonoscopy  01/09/2021   COVID-19 Vaccine (3 - 2024-25 season) 12/30/2022   HEMOGLOBIN A1C  04/12/2023   Diabetic kidney evaluation - eGFR measurement  09/04/2023   Medicare Annual Wellness (AWV)  05/30/2024   Pneumonia Vaccine 19+ Years old  Completed   DEXA SCAN  Completed   HPV VACCINES  Aged Out   INFLUENZA VACCINE  Discontinued    Health Maintenance  Health Maintenance Due  Topic Date Due   Diabetic kidney evaluation - Urine ACR  Never done   DTaP/Tdap/Td (1 - Tdap) Never done   Zoster Vaccines- Shingrix (1 of 2) Never done   FOOT EXAM  07/28/2019   OPHTHALMOLOGY EXAM  08/19/2020   Colonoscopy  01/09/2021   COVID-19 Vaccine (3 - 2024-25 season) 12/30/2022   HEMOGLOBIN A1C  04/12/2023    Colorectal cancer screening: Referral to GI placed Deferred. Pt aware the office will call re: appt.    Bone Density status: Completed 02/04/20. Results reflect: Bone density results: OSTEOPOROSIS. Repeat every   years.     Additional Screening:    Vision Screening: Recommended annual ophthalmology exams for early detection of glaucoma and other disorders of the eye. Is the patient up to date with their annual eye exam?  Yes  Who is the provider or what is the name of the office in which the patient attends annual eye exams? [facility] If pt is not established with a provider, would they like to be referred to a provider to establish care? No .   Dental Screening: Recommended annual dental exams for proper oral hygiene  Diabetic Foot Exam: Diabetic Foot Exam: Overdue, Pt has been advised about the importance in completing this exam. Pt is scheduled for diabetic foot exam on Deferred.  Community Resource Referral / Chronic Care Management:  CRR required  this visit?  No   CCM required this visit?  No     Plan:     I have personally reviewed and noted the following in the patient's chart:   Medical and social history Use of alcohol, tobacco or  illicit drugs  Current medications and supplements including opioid prescriptions. Patient is currently taking opioid prescriptions. Information provided to patient regarding non-opioid alternatives. Patient advised to discuss non-opioid treatment plan with their provider. Functional ability and status Nutritional status Physical activity Advanced directives List of other physicians Hospitalizations, surgeries, and ER visits in previous 12 months Vitals Screenings to include cognitive, depression, and falls Referrals and appointments  In addition, I have reviewed and discussed with patient certain preventive protocols, quality metrics, and best practice recommendations. A written personalized care plan for preventive services as well as general preventive health recommendations were provided to patient.     Tillie Rung, LPN   [id number]/[id number]   After Visit Summary: (MyChart) Due to this being a telephonic visit, the after visit summary with patients personalized plan was offered to patient via MyChart   Nurse Notes: Patient due labs Hemoglobin A1C and Diabetic kidney evaluation Urine ACR

## 2023-05-31 NOTE — Patient Instructions (Addendum)
[name] , Thank you for taking time to come for your Medicare Wellness Visit. I appreciate your ongoing commitment to your health goals. Please review the following plan we discussed and let me know if I can assist you in the future.   Referrals/Orders/Follow-Ups/Clinician Recommendations:   This is a list of the screening recommended for you and due dates:  Health Maintenance  Topic Date Due   Yearly kidney health urinalysis for diabetes  Never done   DTaP/Tdap/Td vaccine (1 - Tdap) Never done   Zoster (Shingles) Vaccine (1 of 2) Never done   Complete foot exam   07/28/2019   Eye exam for diabetics  08/19/2020   Colon Cancer Screening  01/09/2021   COVID-19 Vaccine (3 - 2024-25 season) 12/30/2022   Hemoglobin A1C  04/12/2023   Yearly kidney function blood test for diabetes  09/04/2023   Medicare Annual Wellness Visit  05/30/2024   Pneumonia Vaccine  Completed   DEXA scan (bone density measurement)  Completed   HPV Vaccine  Aged Out   Flu Shot  Discontinued   Opioid Pain Medicine Management Opioids are powerful medicines that are used to treat moderate to severe pain. When used for short periods of time, they can help you to: Sleep better. Do better in physical or occupational therapy. Feel better in the first few days after an injury. Recover from surgery. Opioids should be taken with the supervision of a trained health care provider. They should be taken for the shortest period of time possible. This is because opioids can be addictive, and the longer you take opioids, the greater your risk of addiction. This addiction can also be called opioid use disorder. What are the risks? Using opioid pain medicines for longer than 3 days increases your risk of side effects. Side effects include: Constipation. Nausea and vomiting. Breathing difficulties (respiratory depression). Drowsiness. Confusion. Opioid use disorder. Itching. Taking opioid pain medicine for a long period of  time can affect your ability to do daily tasks. It also puts you at risk for: Motor vehicle crashes. Depression. Suicide. Heart attack. Overdose, which can be life-threatening. What is a pain treatment plan? A pain treatment plan is an agreement between you and your health care provider. Pain is unique to each person, and treatments vary depending on your condition. To manage your pain, you and your health care provider need to work together. To help you do this: Discuss the goals of your treatment, including how much pain you might expect to have and how you will manage the pain. Review the risks and benefits of taking opioid medicines. Remember that a good treatment plan uses more than one approach and minimizes the chance of side effects. Be honest about the amount of medicines you take and about any drug or alcohol use. Get pain medicine prescriptions from only one health care provider. Pain can be managed with many types of alternative treatments. Ask your health care provider to refer you to one or more specialists who can help you manage pain through: Physical or occupational therapy. Counseling (cognitive behavioral therapy). Good nutrition. Biofeedback. Massage. Meditation. Non-opioid medicine. Following a gentle exercise program. How to use opioid pain medicine Taking medicine Take your pain medicine exactly as told by your health care provider. Take it only when you need it. If your pain gets less severe, you may take less than your prescribed dose if your health care provider approves. If you are not having pain, do nottake pain medicine unless your health  care provider tells you to take it. If your pain is severe, do nottry to treat it yourself by taking more pills than instructed on your prescription. Contact your health care provider for help. Write down the times when you take your pain medicine. It is easy to become confused while on pain medicine. Writing the time can  help you avoid overdose. Take other over-the-counter or prescription medicines only as told by your health care provider. Keeping yourself and others safe  While you are taking opioid pain medicine: Do not drive, use machinery, or power tools. Do not sign legal documents. Do not drink alcohol. Do not take sleeping pills. Do not supervise children by yourself. Do not do activities that require climbing or being in high places. Do not go to a lake, river, ocean, spa, or swimming pool. Do not share your pain medicine with anyone. Keep pain medicine in a locked cabinet or in a secure area where pets and children cannot reach it. Stopping your use of opioids If you have been taking opioid medicine for more than a few weeks, you may need to slowly decrease (taper) how much you take until you stop completely. Tapering your use of opioids can decrease your risk of symptoms of withdrawal, such as: Pain and cramping in the abdomen. Nausea. Sweating. Sleepiness. Restlessness. Uncontrollable shaking (tremors). Cravings for the medicine. Do not attempt to taper your use of opioids on your own. Talk with your health care provider about how to do this. Your health care provider may prescribe a step-down schedule based on how much medicine you are taking and how long you have been taking it. Getting rid of leftover pills Do not save any leftover pills. Get rid of leftover pills safely by: Taking the medicine to a prescription take-back program. This is usually offered by the county or law enforcement. Bringing them to a pharmacy that has a drug disposal container. Flushing them down the toilet. Check the label or package insert of your medicine to see whether this is safe to do. Throwing them out in the trash. Check the label or package insert of your medicine to see whether this is safe to do. If it is safe to throw it out, remove the medicine from the original container, put it into a sealable bag or  container, and mix it with used coffee grounds, food scraps, dirt, or cat litter before putting it in the trash. Follow these instructions at home: Activity Do exercises as told by your health care provider. Avoid activities that make your pain worse. Return to your normal activities as told by your health care provider. Ask your health care provider what activities are safe for you. General instructions You may need to take these actions to prevent or treat constipation: Drink enough fluid to keep your urine pale yellow. Take over-the-counter or prescription medicines. Eat foods that are high in fiber, such as beans, whole grains, and fresh fruits and vegetables. Limit foods that are high in fat and processed sugars, such as fried or sweet foods. Keep all follow-up visits. This is important. Where to find support If you have been taking opioids for a long time, you may benefit from receiving support for quitting from a local support group or counselor. Ask your health care provider for a referral to these resources in your area. Where to find more information Centers for Disease Control and Prevention (CDC): FootballExhibition.com.br [place] Food and Drug Administration (FDA): PumpkinSearch.com.ee Get help right away if: You may  have taken too much of an opioid (overdosed). Common symptoms of an overdose: Your breathing is slower or more shallow than normal. You have a very slow heartbeat (pulse). You have slurred speech. You have nausea and vomiting. Your pupils become very small. You have other potential symptoms: You are very confused. You faint or feel like you will faint. You have cold, clammy skin. You have blue lips or fingernails. You have thoughts of harming yourself or harming others. These symptoms may represent a serious problem that is an emergency. Do not wait to see if the symptoms will go away. Get medical help right away. Call your local emergency services (911 in the [place] Do not drive  yourself to the hospital.  If you ever feel like you may hurt yourself or others, or have thoughts about taking your own life, get help right away. Go to your nearest emergency department or: Call your local emergency services (911 in the [place] Call the [facility] (3032187220 in the [place] Call a suicide crisis helpline, such as the National Suicide Prevention Lifeline at (606)510-9928 or 988 in the [place] This is open 24 hours a day in the [place] If you're a Veteran: Call 988 and press 1. This is open 24 hours a day. Text the [employer] at 401-832-5499. Summary Opioid medicines can help you manage moderate to severe pain for a short period of time. A pain treatment plan is an agreement between you and your health care provider. Discuss the goals of your treatment, including how much pain you might expect to have and how you will manage the pain. If you think that you or someone else may have taken too much of an opioid, get medical help right away. This information is not intended to replace advice given to you by your health care provider. Make sure you discuss any questions you have with your health care provider. Document Revised: 01/21/2023 Document Reviewed: 07/27/2020 [employer] Patient Education  2024 [employer] Advanced directives: (Declined) Advance directive discussed with you today. Even though you declined this today, please call our office should you change your mind, and we can give you the proper paperwork for you to fill out.  Next Medicare Annual Wellness Visit scheduled for next year: Yes

## 2023-06-04 DIAGNOSIS — J984 Other disorders of lung: Secondary | ICD-10-CM | POA: Diagnosis not present

## 2023-06-04 DIAGNOSIS — M48061 Spinal stenosis, lumbar region without neurogenic claudication: Secondary | ICD-10-CM | POA: Diagnosis not present

## 2023-06-04 DIAGNOSIS — E559 Vitamin D deficiency, unspecified: Secondary | ICD-10-CM | POA: Diagnosis not present

## 2023-06-04 DIAGNOSIS — R5383 Other fatigue: Secondary | ICD-10-CM | POA: Diagnosis not present

## 2023-06-06 ENCOUNTER — Telehealth (INDEPENDENT_AMBULATORY_CARE_PROVIDER_SITE_OTHER): Payer: Self-pay | Admitting: Otolaryngology

## 2023-06-06 NOTE — Telephone Encounter (Signed)
 Patient's son called in and stated that the patient was having reactions (getting sick) to the Clindamycin  300 mg that was prescribed to the patient.   They stopped giving her the med.  Please advise.  [phone]  - Son Sammie Crigler

## 2023-06-07 DIAGNOSIS — M48061 Spinal stenosis, lumbar region without neurogenic claudication: Secondary | ICD-10-CM | POA: Diagnosis not present

## 2023-06-07 DIAGNOSIS — M5459 Other low back pain: Secondary | ICD-10-CM | POA: Diagnosis not present

## 2023-06-15 ENCOUNTER — Other Ambulatory Visit: Payer: Self-pay | Admitting: Family Medicine

## 2023-06-17 ENCOUNTER — Telehealth (INDEPENDENT_AMBULATORY_CARE_PROVIDER_SITE_OTHER): Payer: Self-pay | Admitting: Otolaryngology

## 2023-06-17 NOTE — Telephone Encounter (Signed)
Left vm to confirm appt and address for 06/18/2023.

## 2023-06-18 ENCOUNTER — Encounter (INDEPENDENT_AMBULATORY_CARE_PROVIDER_SITE_OTHER): Payer: Self-pay

## 2023-06-18 ENCOUNTER — Ambulatory Visit (INDEPENDENT_AMBULATORY_CARE_PROVIDER_SITE_OTHER): Payer: PPO | Admitting: Otolaryngology

## 2023-06-18 VITALS — BP 153/70 | HR 68

## 2023-06-18 DIAGNOSIS — H6121 Impacted cerumen, right ear: Secondary | ICD-10-CM

## 2023-06-18 DIAGNOSIS — H9201 Otalgia, right ear: Secondary | ICD-10-CM

## 2023-06-18 DIAGNOSIS — H608X3 Other otitis externa, bilateral: Secondary | ICD-10-CM

## 2023-06-18 DIAGNOSIS — H9190 Unspecified hearing loss, unspecified ear: Secondary | ICD-10-CM

## 2023-06-18 DIAGNOSIS — J9601 Acute respiratory failure with hypoxia: Secondary | ICD-10-CM | POA: Diagnosis not present

## 2023-06-18 DIAGNOSIS — J189 Pneumonia, unspecified organism: Secondary | ICD-10-CM | POA: Diagnosis not present

## 2023-06-18 DIAGNOSIS — I2609 Other pulmonary embolism with acute cor pulmonale: Secondary | ICD-10-CM | POA: Diagnosis not present

## 2023-06-18 MED ORDER — BETAMETHASONE DIPROPIONATE 0.05 % EX CREA
TOPICAL_CREAM | Freq: Two times a day (BID) | CUTANEOUS | 0 refills | Status: AC
Start: 2023-06-18 — End: 2023-06-28

## 2023-06-18 NOTE — Patient Instructions (Signed)
Apply betamethasone ointment just on outside where ear canal opening is twice per day for 10 days Do not use qtips or scratch ear Keep using ofloxacin ear drops (4 drops twice daily) for 14 more days

## 2023-06-18 NOTE — Progress Notes (Signed)
Dear Dr. Clent Ridges, Here is my assessment for our mutual patient, [name]. Thank you for allowing me the opportunity to care for your patient. Please do not hesitate to contact me should you have any other questions. Sincerely, Dr. Jovita Kussmaul  Otolaryngology Clinic Note Referring provider: Dr. Clent Ridges HPI:  [name] is a 83 y.o. female kindly referred by Dr. Clent Ridges for evaluation of right ear pain.  Initial visit (05/2023): Patient reports: right ear pain, fullness ("feeling like seashell over the ear") ongoing for at least a month. Right ear also itching a lot. Some hearing decline -- worse over past few months. She had some drainage as well (unclear color), but stopped stopped a few weeks ago (2-3). She has been using cortisporin drops - sparingly. Was prescribed levofloxacin but no improvement. Uses q-tips sparingly now after told stopped using them - does not report related to q-tip use. No URI or other antecedent event. No ear surgery, no recent audio  Patient denies: vertigo, tinnitus Patient additionally denies: deep pain in ear canal, eustachian tube symptoms such as popping/crackling, sensitive to pressure changes Patient also denies barotrauma, vestibular suppressant use, ototoxic medication use Prior ear surgery: no Ears not normally a problem. No recent ear infections or UR Does have diabetes, reports well controlled.  --------------------------------------------------------- 06/18/2023 Doing much better. Had reaction to clindamycin but stopped it and back to normal. No fullness, pain. Some itching but now more so on left. No drainage. No recent audio; no vertigo or tinnitus. -------------------------------------------------------  H&N Surgery: no Personal or FHx of bleeding dz or anesthesia difficulty: no   GLP-1: no AP/AC: Plavix  Tobacco: no current use but priro smoker, quit 1975  PMHx: Chronic respiratory failure, Asthma, Allergies, Chronic pain, pHTN, UC, Pancreatic  Insufficiency, CHF, T2DM, CAD Independent Review of Additional Tests or Records:  Dr. Marlan Palau (IM) 04/22/2023 - right ear pain, drainage from ears (black-ish?); tried to remove cerumen but could not. Got Levofloxacin. ED visit (05/14/2023): Clancy Gourd, NP - noted right ear pain, no injury; no cerumen noted, Dx: AOM; Rx: supportive, f/u with ENT HgA1c 10/11/2022: 6.8 CBC, and CMP 09/04/2022: WBC 11.2, Hgb 11.8, Plt 532, BUN/Cr wnl CTH 02/12/2021: independently reviewed, cuts thick but mastoids and ME well aerated; ossicles unremarkable, no erosive changes in EAC noted  PMH/Meds/All/SocHx/FamHx/ROS:   Past Medical History:  Diagnosis Date   Adenomatous colon polyp 02/2006   Allergy    Allergy, unspecified not elsewhere classified    Anemia    Anxiety    Arthritis    Asthma    Blood transfusion without reported diagnosis    Bronchiectasis    CAD (coronary artery disease)    BMS to the LAD, 2002; cath 12/07/11 patent LAD stent and mild nonobstructive disease, EF 65%; Medical management   Cataract    removed both eyes   CHF (congestive heart failure) (HCC)    Diastolic   Chronic diastolic CHF (congestive heart failure) (HCC) 11/25/2011   Clotting disorder (HCC)    in legs per pt    Diabetes mellitus    Diverticulosis of colon    DJD (degenerative joint disease)    Fibromyalgia    GERD (gastroesophageal reflux disease)    H. pylori infection 05/12/2019   Hypercholesterolemia    Hypertension    Microscopic hematuria    Neoplasm of kidney    Obesity    Other diseases of lung, not elsewhere classified    Pinched vertebral nerve    Pulmonary nodule  Negative PET in 2010   Stress incontinence, female    Syncope    Ulcerative colitis, left sided (HCC) 2008   Hx of   UTI (lower urinary tract infection)    Vitamin D deficiency disease      Past Surgical History:  Procedure Laterality Date   ABDOMINAL HYSTERECTOMY     CARDIAC CATHETERIZATION  11/2011   CATARACT  EXTRACTION, BILATERAL  2020   CHOLECYSTECTOMY     COLONOSCOPY  01/09/2018   per Dr. Russella Dar, adenomatous polyps, repeat in 3 yrs   CORONARY STENT PLACEMENT  2002   BMS to the LAD   ESOPHAGOGASTRODUODENOSCOPY  12/2008   HAND SURGERY  01/2006   Right Hand Surgery by Dr. Amanda Pea   LEFT HEART CATHETERIZATION WITH CORONARY ANGIOGRAM N/A 12/07/2011   Procedure: LEFT HEART CATHETERIZATION WITH CORONARY ANGIOGRAM;  Surgeon: Peter M Swaziland, MD;  Location: [facility] CATH LAB;  Service: Cardiovascular;  Laterality: N/A;   RIGHT/LEFT HEART CATH AND CORONARY ANGIOGRAPHY N/A 08/07/2021   Procedure: RIGHT/LEFT HEART CATH AND CORONARY ANGIOGRAPHY;  Surgeon: Dolores Patty, MD;  Location: [facility] INVASIVE CV LAB;  Service: Cardiovascular;  Laterality: N/A;   UPPER GASTROINTESTINAL ENDOSCOPY      Family History  Problem Relation Age of Onset   Pneumonia Father    Heart attack Father    Colon polyps Father    Parkinsonism Mother    Diabetes Brother    Sarcoidosis Brother    Hypertension Sister    Diabetes Sister    Breast cancer Daughter    Colon cancer Neg Hx    Esophageal cancer Neg Hx    Rectal cancer Neg Hx    Stomach cancer Neg Hx      Social Connections: Socially Integrated (05/31/2023)   Social Connection and Isolation Panel [NHANES]    Frequency of Communication with Friends and Family: More than three times a week    Frequency of Social Gatherings with Friends and Family: More than three times a week    Attends Religious Services: More than 4 times per year    Active Member of [employer] or Organizations: Yes    Attends Engineer, structural: More than 4 times per year    Marital Status: Married      Current Outpatient Medications:    acetaminophen (TYLENOL) 500 MG tablet, Take 500 mg by mouth every 6 (six) hours as needed for headache (pain). , Disp: , Rfl:    albuterol (PROVENTIL) (2.5 MG/3ML) 0.083% nebulizer solution, Take 3 mLs (2.5 mg total) by nebulization every 4 (four) hours as  needed for wheezing or shortness of breath., Disp: 75 mL, Rfl: 3   albuterol (VENTOLIN HFA) 108 (90 BASE) MCG/ACT inhaler, Inhale 2 puffs into the lungs every 4 (four) hours as needed for wheezing or shortness of breath., Disp: 18 each, Rfl: 11   ALPRAZolam (XANAX) 0.5 MG tablet, Take 1 tablet (0.5 mg total) by mouth 3 (three) times daily as needed., Disp: 90 tablet, Rfl: 5   amLODipine (NORVASC) 10 MG tablet, TAKE 1 TABLET BY MOUTH EVERY DAY, Disp: 90 tablet, Rfl: 3   atorvastatin (LIPITOR) 20 MG tablet, Take 1 tablet (20 mg total) by mouth daily., Disp: 90 tablet, Rfl: 3   azelastine (ASTELIN) 0.1 % nasal spray, Place 1 spray into both nostrils at bedtime., Disp: , Rfl:    betamethasone dipropionate 0.05 % cream, Apply topically 2 (two) times daily for 10 days. Apply just on outside where ear canal opening is twice per  day, Disp: 45 g, Rfl: 0   Blood Glucose Monitoring Suppl (ONETOUCH VERIO FLEX SYSTEM) w/Device KIT, 1 each by Does not apply route daily at 12 noon., Disp: 1 kit, Rfl: 0   budesonide (ENTOCORT EC) 3 MG 24 hr capsule, TAKE 3 CAPSULES (9 MG TOTAL) BY MOUTH DAILY. PLEASE TAKE FOR ONE MORE MONTH THEN DISCONTINUE., Disp: 270 capsule, Rfl: 1   budesonide-formoterol (SYMBICORT) 160-4.5 MCG/ACT inhaler, Inhale 2 puffs into the lungs 2 (two) times daily., Disp: , Rfl:    Cholecalciferol (VITAMIN D-3) 5000 UNITS TABS, Patient takes 1 tablet by mouth once a week on Mondays., Disp: , Rfl:    clopidogrel (PLAVIX) 75 MG tablet, TAKE 1 TABLET BY MOUTH DAILY. PLEASE CALL [phone] TO SCHEDULE AN OVERDUE APPOINTMENT WITH DR. MARK SKAINS FOR FUTURE REFILLS. THANK YOU. 2ND ATTEMPT., Disp: 90 tablet, Rfl: 3   clotrimazole-betamethasone (LOTRISONE) cream, APPLY 1 APPLICATION TOPICALLY TWICE DAILY TO AFFECTED AREAS OF FEET AND ANKLES FOR 4 WEEKS, Disp: 45 g, Rfl: 3   colchicine 0.6 MG tablet, Take 0.6 mg by mouth 2 (two) times daily., Disp: , Rfl:    dicyclomine (BENTYL) 10 MG capsule, Take 1 capsule (10  mg total) by mouth 3 (three) times daily as needed for spasms., Disp: 90 capsule, Rfl: 3   diphenoxylate-atropine (LOMOTIL) 2.5-0.025 MG tablet, TAKE 1 TABLET BY MOUTH 2 TIMES DAILY AS NEEDED, Disp: 60 tablet, Rfl: 1   FARXIGA 10 MG TABS tablet, TAKE 1 TABLET BY MOUTH EVERY DAY, Disp: 30 tablet, Rfl: 5   fluticasone (FLONASE) 50 MCG/ACT nasal spray, PLACE 2 SPRAYS IN EACH NOSTRIL AT BEDTIME, Disp: 16 g, Rfl: 6   gabapentin (NEURONTIN) 300 MG capsule, Take 1 capsule (300 mg total) by mouth 2 (two) times daily., Disp: 60 capsule, Rfl: 11   glimepiride (AMARYL) 1 MG tablet, TAKE 2 TABLETS (2 MG TOTAL) BY MOUTH DAILY WITH BREAKFAST, Disp: 180 tablet, Rfl: 0   HYDROcodone bit-homatropine (HYCODAN) 5-1.5 MG/5ML syrup, Take 5 mLs by mouth every 4 (four) hours as needed., Disp: 240 mL, Rfl: 0   HYDROcodone-acetaminophen (NORCO) 10-325 MG tablet, Take 1 tablet by mouth every 6 (six) hours as needed for moderate pain (pain score 4-6)., Disp: 120 tablet, Rfl: 0   HYDROcodone-acetaminophen (NORCO) 10-325 MG tablet, Take 1 tablet by mouth every 6 (six) hours as needed for moderate pain (pain score 4-6)., Disp: 120 tablet, Rfl: 0   HYDROcodone-acetaminophen (NORCO) 10-325 MG tablet, Take 1 tablet by mouth every 6 (six) hours as needed for moderate pain (pain score 4-6)., Disp: 120 tablet, Rfl: 0   hydrOXYzine (ATARAX) 10 MG tablet, Take 10 mg by mouth every 6 (six) hours as needed., Disp: , Rfl:    JANUVIA 100 MG tablet, TAKE 1 TABLET BY MOUTH EVERY DAY, Disp: 90 tablet, Rfl: 3   ketoconazole (NIZORAL) 2 % cream, Apply 1 Application topically daily. Apply 1gm to bilateral foot  once daily, Disp: 60 g, Rfl: 2   KLOR-CON M20 20 MEQ tablet, TAKE 1 TABLET BY MOUTH TWICE A DAY, Disp: 180 tablet, Rfl: 3   levocetirizine (XYZAL) 5 MG tablet, Take 5 mg by mouth every evening., Disp: , Rfl:    levofloxacin (LEVAQUIN) 500 MG tablet, Take 1 tablet (500 mg total) by mouth daily., Disp: 10 tablet, Rfl: 0   LIDODERM 5 %, APPLY  1 PATCH TO SKIN FOR 12 HOURS THEN REMOVE AND DISCARD PATCH WITHIN 72 HOURS OR AS DIRECTED, Disp: 30 patch, Rfl: 2   loperamide (IMODIUM) 2  MG capsule, Take 4-6 mg by mouth daily. Taking as needed only, Disp: , Rfl:    losartan (COZAAR) 25 MG tablet, Take 1 tablet (25 mg total) by mouth daily., Disp: 90 tablet, Rfl: 3   magnesium oxide (MAG-OX) 400 MG tablet, TAKE 3 TABLETS (1,200 MG TOTAL) BY MOUTH DAILY., Disp: 90 tablet, Rfl: 5   mesalamine (LIALDA) 1.2 g EC tablet, Take 2 tablets (2.4 g total) by mouth 2 (two) times daily., Disp: 360 tablet, Rfl: 1   metoprolol succinate (TOPROL-XL) 100 MG 24 hr tablet, TAKE 1 TABLET BY MOUTH DAILY. TAKE WITH OR IMMEDIATELY FOLLOWING A MEAL., Disp: 90 tablet, Rfl: 1   montelukast (SINGULAIR) 10 MG tablet, Take one every morning, Disp: 90 tablet, Rfl: 3   nitroGLYCERIN (NITROSTAT) 0.4 MG SL tablet, PLACE 1 TABLET UNDER THE TONGUE EVERY 5 MINUTES AS NEEDED FOR CHEST PAIN., Disp: 75 tablet, Rfl: 2   ofloxacin (FLOXIN) 0.3 % OTIC solution, Place 4 drops into the right ear daily for 14 days., Disp: 10 mL, Rfl: 1   Olopatadine HCl 0.7 % SOLN, Place 1 drop into both eyes daily as needed (itching/allergies)., Disp: , Rfl:    OneTouch Delica Lancets 33G MISC, 1 each by Does not apply route daily at 12 noon., Disp: 100 each, Rfl: 0   ONETOUCH VERIO test strip, USE TO CHECK BLOOD GLUCOSE ONCE DAILY, Disp: 100 strip, Rfl: 1   Pancrelipase, Lip-Prot-Amyl, (ZENPEP) 40000-126000 units CPEP, Take by mouth. Take 2 tablets by mouth before meals and 1 tablet before snacks, Disp: , Rfl:    pantoprazole (PROTONIX) 40 MG tablet, TAKE 1 TABLET BY MOUTH TWICE A DAY, Disp: 180 tablet, Rfl: 0   Polyethyl Glycol-Propyl Glycol (SYSTANE OP), Place 1 drop into both eyes daily. For dry eyes, Disp: , Rfl:    tiZANidine (ZANAFLEX) 4 MG tablet, Take 4 mg by mouth 2 (two) times daily., Disp: , Rfl:    torsemide (DEMADEX) 20 MG tablet, TAKE 2 TABLETS BY MOUTH EVERY DAY, Disp: 180 tablet, Rfl: 0    Vitamin D, Ergocalciferol, (DRISDOL) 1.25 MG (50000 UNIT) CAPS capsule, Take 50,000 Units by mouth every 14 (fourteen) days., Disp: , Rfl:    ZENPEP 20000-63000 units CPEP, TAKE 2 CAPSULES (40,000 UNITS) WITH MEALS AND 1 CAPSULE (20,000 UNITS) WITH A SNACK, Disp: 240 capsule, Rfl: 1  Current Facility-Administered Medications:    ipratropium-albuterol (DUONEB) 0.5-2.5 (3) MG/3ML nebulizer solution 3 mL, 3 mL, Nebulization, Once, Nafziger, Cory, NP   Physical Exam:   BP (!) 153/70 (BP Location: Right Arm, Patient Position: Sitting, Cuff Size: Normal)   Pulse 68   LMP  (LMP Unknown)   SpO2 92%   Salient findings:  CN II-XII intact Given history and complaints, ear microscopy was indicated and performed for evaluation with findings as below in physical exam section and in procedures.  Left EAC clear and TM intact with well pneumatized middle ear space, some eczematoid change; Right EAC ceruminous debris impaction; after clearance, anterior bony canal polypoid area has resolved; canal is intact no exposed bone, TM intact and ME well aerated Weber 512: mid Rinne 512: AC > BC b/l  Anterior rhinoscopy: Septum intact; bilateral inferior turbinates without significant hypertrophy No lesions of oral cavity/oropharynx No obviously palpable neck masses/lymphadenopathy/thyromegaly No respiratory distress or stridor  Seprately Identifiable Procedures:  Procedure: Bilateral ear microscopy and cerumen removal using microscope (CPT 16109) - Mod 25 Pre-procedure diagnosis: Cerumen impaction right external ears Post-procedure diagnosis: same Indication: right ear cerumen impaction; given patient's  otologic complaints and history as well as for improved and comprehensive examination of external ear and tympanic membrane, bilateral otologic examination using microscope was performed and impacted cerumen removed  Procedure: Patient was placed semi-recumbent. Both ear canals were examined using the microscope  with findings above. Impacted debris and cerumen removed on right using suction and currette with improvement in EAC examination and patency. Some residual ointment suctioned. Findings as above Patient tolerated the procedure well.  Impression & Plans:  [name] is a 83 y.o. female with h/o T2DM now with:  1. Subjective hearing loss   2. Acute otalgia, right   3. Impacted cerumen of right ear   4. Chronic eczematous otitis externa of both ears    Ongoing for at least 6 weeks, small polypoid area over right bony canal noted which is tender - this has resolved after abx/steroids. Likely trauma related at this point(?), though DDX is wide and MOE in differential; carcinoma as well but unlikely; trauma lreated lesion also possible 2/2 qtip use The lesion has resolved and so have her symptoms besides some eczematoid change b/l - having itching. Will treat that Continue Ofloxacin BID x14d  Start betamethasone ointment to ears BID x10d Will schedule for audiogram F/u PRN - if audiogram shows significant loss, will discuss with audiologist and she will call us back  See below regarding exact medications prescribed this encounter including dosages and route: Meds ordered this encounter  Medications   betamethasone dipropionate 0.05 % cream    Sig: Apply topically 2 (two) times daily for 10 days. Apply just on outside where ear canal opening is twice per day    Dispense:  45 g    Refill:  0    Thank you for allowing me the opportunity to care for your patient. Please do not hesitate to contact me should you have any other questions.  Sincerely, Jovita Kussmaul, MD Otolaryngologist (ENT), [facility] Phone: [phone] Fax: [phone]  06/18/2023, 10:14 AM   MDM:  Level 4 - 99214 Complexity/Problems addressed: mod - multiple chronic problems Data complexity: low - Morbidity: mod  - Prescription Drug prescribed or managed: yes

## 2023-06-21 ENCOUNTER — Other Ambulatory Visit: Payer: Self-pay

## 2023-06-21 MED ORDER — BUDESONIDE 3 MG PO CPEP: 9.0000 mg | ORAL_CAPSULE | Freq: Every day | ORAL | 0 refills | Status: DC

## 2023-06-25 ENCOUNTER — Ambulatory Visit (INDEPENDENT_AMBULATORY_CARE_PROVIDER_SITE_OTHER): Payer: PPO | Admitting: Podiatry

## 2023-06-25 ENCOUNTER — Encounter: Payer: Self-pay | Admitting: Podiatry

## 2023-06-25 DIAGNOSIS — M79674 Pain in right toe(s): Secondary | ICD-10-CM | POA: Diagnosis not present

## 2023-06-25 DIAGNOSIS — B351 Tinea unguium: Secondary | ICD-10-CM | POA: Diagnosis not present

## 2023-06-25 DIAGNOSIS — B353 Tinea pedis: Secondary | ICD-10-CM | POA: Diagnosis not present

## 2023-06-25 DIAGNOSIS — M79675 Pain in left toe(s): Secondary | ICD-10-CM | POA: Diagnosis not present

## 2023-06-25 MED ORDER — CICLOPIROX 8 % EX SOLN: Freq: Every day | CUTANEOUS | 11 refills | Status: AC

## 2023-06-25 MED ORDER — KETOCONAZOLE 2 % EX CREA: 1.0000 | TOPICAL_CREAM | Freq: Every day | CUTANEOUS | 2 refills | Status: DC

## 2023-06-25 NOTE — Progress Notes (Unsigned)
 Subjective:  Patient ID: [name], female    DOB: 10-Jan-1941,  MRN: [id number]  [name] presents to clinic today for:  Chief Complaint  Patient presents with   Nail Problem    Patient is here for Routine [facility]   Patient notes nails are thick, discolored, elongated and painful in shoegear when trying to ambulate.  Patient with like to start treating the fungal toenails.  There is a family member with her today.  As she does not like the discoloration of the nails.  She did state that the prescription ketoconazole cream that was prescribed previously does seem to work better than the Lotrisone cream and would like additional refill sent in today.  PCP is Nelwyn Salisbury, MD.  Past Medical History:  Diagnosis Date   Adenomatous colon polyp 02/2006   Allergy    Allergy, unspecified not elsewhere classified    Anemia    Anxiety    Arthritis    Asthma    Blood transfusion without reported diagnosis    Bronchiectasis    CAD (coronary artery disease)    BMS to the LAD, 2002; cath 12/07/11 patent LAD stent and mild nonobstructive disease, EF 65%; Medical management   Cataract    removed both eyes   CHF (congestive heart failure) (HCC)    Diastolic   Chronic diastolic CHF (congestive heart failure) (HCC) 11/25/2011   Clotting disorder (HCC)    in legs per pt    Diabetes mellitus    Diverticulosis of colon    DJD (degenerative joint disease)    Fibromyalgia    GERD (gastroesophageal reflux disease)    H. pylori infection 05/12/2019   Hypercholesterolemia    Hypertension    Microscopic hematuria    Neoplasm of kidney    Obesity    Other diseases of lung, not elsewhere classified    Pinched vertebral nerve    Pulmonary nodule    Negative PET in 2010   Stress incontinence, female    Syncope    Ulcerative colitis, left sided (HCC) 2008   Hx of   UTI (lower urinary tract infection)    Vitamin D deficiency disease     Past Surgical History:  Procedure Laterality  Date   ABDOMINAL HYSTERECTOMY     CARDIAC CATHETERIZATION  11/2011   CATARACT EXTRACTION, BILATERAL  2020   CHOLECYSTECTOMY     COLONOSCOPY  01/09/2018   per Dr. Russella Dar, adenomatous polyps, repeat in 3 yrs   CORONARY STENT PLACEMENT  2002   BMS to the LAD   ESOPHAGOGASTRODUODENOSCOPY  12/2008   HAND SURGERY  01/2006   Right Hand Surgery by Dr. Amanda Pea   LEFT HEART CATHETERIZATION WITH CORONARY ANGIOGRAM N/A 12/07/2011   Procedure: LEFT HEART CATHETERIZATION WITH CORONARY ANGIOGRAM;  Surgeon: Peter M Swaziland, MD;  Location: [facility] CATH LAB;  Service: Cardiovascular;  Laterality: N/A;   RIGHT/LEFT HEART CATH AND CORONARY ANGIOGRAPHY N/A 08/07/2021   Procedure: RIGHT/LEFT HEART CATH AND CORONARY ANGIOGRAPHY;  Surgeon: Dolores Patty, MD;  Location: [facility] INVASIVE CV LAB;  Service: Cardiovascular;  Laterality: N/A;   UPPER GASTROINTESTINAL ENDOSCOPY      Allergies  Allergen Reactions   Doxycycline Other (See Comments)    Unsteady gait   Aspirin Nausea And Vomiting   Azithromycin Other (See Comments)    REACTION: pt states "ZPak doesn't work"   Caffeine Nausea And Vomiting   Ciprofloxacin Nausea And Vomiting    Tolerates levaquin   Codeine Nausea  And Vomiting   Oxycodone Other (See Comments)    Pt stated, "upsets my stomach" (11/19/17)   Sulfamethizole Other (See Comments)    Unknown reaction   Tramadol Hcl Other (See Comments)    REACTION: pt states "spaced-out"    Review of Systems: Negative except as noted in the HPI.  Objective:  [name] is a pleasant 83 y.o. female in NAD. AAO x 3.  Vascular Examination: Capillary refill time is 3-5 seconds to toes bilateral. Palpable pedal pulses b/l LE. Digital hair present b/l.  Skin temperature gradient WNL b/l. No varicosities b/l. No cyanosis noted b/l.   Dermatological Examination: Pedal skin with normal turgor, texture and tone b/l. No open wounds. No interdigital macerations b/l. Toenails x10 are 3mm thick, discolored,  dystrophic with subungual debris. There is pain with compression of the nail plates.  They are elongated x10.  There is minimal peeling and xerosis to the plantar aspect of both feet.     Latest Ref Rng & Units 10/11/2022   10:59 AM  Hemoglobin A1C  Hemoglobin-A1c 4.0 - 5.6 % 6.8    Assessment/Plan: 1. Pain due to onychomycosis of toenails of both feet   2. Tinea pedis of both feet     Meds ordered this encounter  Medications   ketoconazole (NIZORAL) 2 % cream    Sig: Apply 1 Application topically daily. Apply 1gm to bilateral foot  once daily    Dispense:  60 g    Refill:  2   ciclopirox (PENLAC) 8 % solution    Sig: Apply topically at bedtime. Apply over nail. Apply daily over previous coat. Remove weekly with polish remover.    Dispense:  6.6 mL    Refill:  11   The mycotic toenails were sharply debrided x10 with sterile nail nippers and a power debriding burr to decrease bulk/thickness and length.  Prescription for ciclopirox solution was sent to her pharmacy today.  Patient was instructed on how to apply the nail lacquer and how to remove it once weekly.  She was instructed it may take 10 months to 1 year of continued use to see improvement.  She just needs to get into the habit of applying it every day.  Will renew the ketoconazole 2% cream for her to apply once daily to the plantar aspect of both feet/skin.  Additional refills were also authorized with this new prescription.  Return in about 3 months (around 09/22/2023) for [facility].   Clerance Lav, DPM, FACFAS [facility]     [address] [place], [place] [address]                Office [phone]  Fax [phone]

## 2023-06-28 ENCOUNTER — Other Ambulatory Visit: Payer: Self-pay | Admitting: Family Medicine

## 2023-06-28 NOTE — Telephone Encounter (Signed)
 Copied from CRM [id number]. Topic: Clinical - Medication Refill >> Jun 28, 2023 10:15 AM Corin V wrote: Most Recent Primary Care Visit:  Provider: Tillie Rung  Department: [facility]  Visit Type: MEDICARE AWV, SEQUENTIAL  Date: 05/31/2023  Medication: HYDROcodone-acetaminophen (NORCO) 10-325 MG tablet  Has the patient contacted their pharmacy? Yes (Agent: If no, request that the patient contact the pharmacy for the refill. If patient does not wish to contact the pharmacy document the reason why and proceed with request.) (Agent: If yes, when and what did the pharmacy advise?)  Is this the correct pharmacy for this prescription? Yes If no, delete pharmacy and type the correct one.  This is the patient's preferred pharmacy:  [facility] [address] [place] - [address] AT [address] [place] [address] Phone: [phone] Fax: [phone]    Has the prescription been filled recently? No  Is the patient out of the medication? No  Has the patient been seen for an appointment in the last year OR does the patient have an upcoming appointment? Yes  Can we respond through MyChart? Yes  Agent: Please be advised that Rx refills may take up to 3 business days. We ask that you follow-up with your pharmacy.

## 2023-06-28 NOTE — Telephone Encounter (Signed)
 Last Fill: 03/06/23 3 scripts-120 tabs/0 RF  Last OV: 05/31/23 AWV               Next OV: 06/05/24 AWV  Routing to provider for review/authorization.

## 2023-07-01 ENCOUNTER — Telehealth: Payer: Self-pay

## 2023-07-01 ENCOUNTER — Other Ambulatory Visit: Payer: Self-pay | Admitting: Family Medicine

## 2023-07-01 DIAGNOSIS — E119 Type 2 diabetes mellitus without complications: Secondary | ICD-10-CM

## 2023-07-01 NOTE — Telephone Encounter (Signed)
 PA for ciclopirox 8% solution was denied for the following reason    An appeal can be done within 60 days of denial.

## 2023-07-02 ENCOUNTER — Telehealth: Admitting: Family Medicine

## 2023-07-02 ENCOUNTER — Encounter: Payer: Self-pay | Admitting: Family Medicine

## 2023-07-02 DIAGNOSIS — M5441 Lumbago with sciatica, right side: Secondary | ICD-10-CM

## 2023-07-02 DIAGNOSIS — M5442 Lumbago with sciatica, left side: Secondary | ICD-10-CM | POA: Diagnosis not present

## 2023-07-02 DIAGNOSIS — G8929 Other chronic pain: Secondary | ICD-10-CM | POA: Diagnosis not present

## 2023-07-02 DIAGNOSIS — F1190 Opioid use, unspecified, uncomplicated: Secondary | ICD-10-CM

## 2023-07-02 DIAGNOSIS — J984 Other disorders of lung: Secondary | ICD-10-CM | POA: Diagnosis not present

## 2023-07-02 MED ORDER — HYDROMORPHONE HCL 4 MG PO TABS: 4.0000 mg | ORAL_TABLET | Freq: Four times a day (QID) | ORAL | 0 refills | Status: DC | PRN

## 2023-07-02 NOTE — Progress Notes (Signed)
 Subjective:    Patient ID: [name], female    DOB: 07/18/1940, 83 y.o.   MRN: [id number]  HPI Virtual Visit via Video Note  I connected with the patient on 07/02/23 at  3:00 PM EST by a video enabled telemedicine application and verified that I am speaking with the correct person using two identifiers.  Location patient: home Location provider:work or home office Persons participating in the virtual visit: patient, provider  I discussed the limitations of evaluation and management by telemedicine and the availability of in person appointments. The patient expressed understanding and agreed to proceed.   HPI: Here for pain management. She says the Norco does not help her pain the way it used to. She asks to try something stronger.    ROS: See pertinent positives and negatives per HPI.  Past Medical History:  Diagnosis Date   Adenomatous colon polyp 02/2006   Allergy    Allergy, unspecified not elsewhere classified    Anemia    Anxiety    Arthritis    Asthma    Blood transfusion without reported diagnosis    Bronchiectasis    CAD (coronary artery disease)    BMS to the LAD, 2002; cath 12/07/11 patent LAD stent and mild nonobstructive disease, EF 65%; Medical management   Cataract    removed both eyes   CHF (congestive heart failure) (HCC)    Diastolic   Chronic diastolic CHF (congestive heart failure) (HCC) 11/25/2011   Clotting disorder (HCC)    in legs per pt    Diabetes mellitus    Diverticulosis of colon    DJD (degenerative joint disease)    Fibromyalgia    GERD (gastroesophageal reflux disease)    H. pylori infection 05/12/2019   Hypercholesterolemia    Hypertension    Microscopic hematuria    Neoplasm of kidney    Obesity    Other diseases of lung, not elsewhere classified    Pinched vertebral nerve    Pulmonary nodule    Negative PET in 2010   Stress incontinence, female    Syncope    Ulcerative colitis, left sided (HCC) 2008   Hx of   UTI (lower  urinary tract infection)    Vitamin D deficiency disease     Past Surgical History:  Procedure Laterality Date   ABDOMINAL HYSTERECTOMY     CARDIAC CATHETERIZATION  11/2011   CATARACT EXTRACTION, BILATERAL  2020   CHOLECYSTECTOMY     COLONOSCOPY  01/09/2018   per Dr. Russella Dar, adenomatous polyps, repeat in 3 yrs   CORONARY STENT PLACEMENT  2002   BMS to the LAD   ESOPHAGOGASTRODUODENOSCOPY  12/2008   HAND SURGERY  01/2006   Right Hand Surgery by Dr. Amanda Pea   LEFT HEART CATHETERIZATION WITH CORONARY ANGIOGRAM N/A 12/07/2011   Procedure: LEFT HEART CATHETERIZATION WITH CORONARY ANGIOGRAM;  Surgeon: Peter M Swaziland, MD;  Location: [facility] CATH LAB;  Service: Cardiovascular;  Laterality: N/A;   RIGHT/LEFT HEART CATH AND CORONARY ANGIOGRAPHY N/A 08/07/2021   Procedure: RIGHT/LEFT HEART CATH AND CORONARY ANGIOGRAPHY;  Surgeon: Dolores Patty, MD;  Location: [facility] INVASIVE CV LAB;  Service: Cardiovascular;  Laterality: N/A;   UPPER GASTROINTESTINAL ENDOSCOPY      Family History  Problem Relation Age of Onset   Pneumonia Father    Heart attack Father    Colon polyps Father    Parkinsonism Mother    Diabetes Brother    Sarcoidosis Brother    Hypertension Sister    Diabetes  Sister    Breast cancer Daughter    Colon cancer Neg Hx    Esophageal cancer Neg Hx    Rectal cancer Neg Hx    Stomach cancer Neg Hx      Current Outpatient Medications:    acetaminophen (TYLENOL) 500 MG tablet, Take 500 mg by mouth every 6 (six) hours as needed for headache (pain). , Disp: , Rfl:    albuterol (PROVENTIL) (2.5 MG/3ML) 0.083% nebulizer solution, Take 3 mLs (2.5 mg total) by nebulization every 4 (four) hours as needed for wheezing or shortness of breath., Disp: 75 mL, Rfl: 3   albuterol (VENTOLIN HFA) 108 (90 BASE) MCG/ACT inhaler, Inhale 2 puffs into the lungs every 4 (four) hours as needed for wheezing or shortness of breath., Disp: 18 each, Rfl: 11   ALPRAZolam (XANAX) 0.5 MG tablet, TAKE 1 TABLET BY  MOUTH 3 TIMES DAILY AS NEEDED., Disp: 90 tablet, Rfl: 5   amLODipine (NORVASC) 10 MG tablet, TAKE 1 TABLET BY MOUTH EVERY DAY, Disp: 90 tablet, Rfl: 3   atorvastatin (LIPITOR) 20 MG tablet, Take 1 tablet (20 mg total) by mouth daily., Disp: 90 tablet, Rfl: 3   azelastine (ASTELIN) 0.1 % nasal spray, Place 1 spray into both nostrils at bedtime., Disp: , Rfl:    Blood Glucose Monitoring Suppl (ONETOUCH VERIO FLEX SYSTEM) w/Device KIT, 1 each by Does not apply route daily at 12 noon., Disp: 1 kit, Rfl: 0   budesonide (ENTOCORT EC) 3 MG 24 hr capsule, Take 3 capsules (9 mg total) by mouth daily. Please take for one more month then discontinue., Disp: 270 capsule, Rfl: 0   budesonide-formoterol (SYMBICORT) 160-4.5 MCG/ACT inhaler, Inhale 2 puffs into the lungs 2 (two) times daily., Disp: , Rfl:    Cholecalciferol (VITAMIN D-3) 5000 UNITS TABS, Patient takes 1 tablet by mouth once a week on Mondays., Disp: , Rfl:    ciclopirox (PENLAC) 8 % solution, Apply topically at bedtime. Apply over nail. Apply daily over previous coat. Remove weekly with polish remover., Disp: 6.6 mL, Rfl: 11   clopidogrel (PLAVIX) 75 MG tablet, TAKE 1 TABLET BY MOUTH DAILY. PLEASE CALL [phone] TO SCHEDULE AN OVERDUE APPOINTMENT WITH DR. MARK SKAINS FOR FUTURE REFILLS. THANK YOU. 2ND ATTEMPT., Disp: 90 tablet, Rfl: 3   clotrimazole-betamethasone (LOTRISONE) cream, APPLY 1 APPLICATION TOPICALLY TWICE DAILY TO AFFECTED AREAS OF FEET AND ANKLES FOR 4 WEEKS, Disp: 45 g, Rfl: 3   colchicine 0.6 MG tablet, Take 0.6 mg by mouth 2 (two) times daily., Disp: , Rfl:    dicyclomine (BENTYL) 10 MG capsule, Take 1 capsule (10 mg total) by mouth 3 (three) times daily as needed for spasms., Disp: 90 capsule, Rfl: 3   diphenoxylate-atropine (LOMOTIL) 2.5-0.025 MG tablet, TAKE 1 TABLET BY MOUTH 2 TIMES DAILY AS NEEDED, Disp: 60 tablet, Rfl: 1   FARXIGA 10 MG TABS tablet, TAKE 1 TABLET BY MOUTH EVERY DAY, Disp: 30 tablet, Rfl: 5   fluticasone  (FLONASE) 50 MCG/ACT nasal spray, PLACE 2 SPRAYS IN EACH NOSTRIL AT BEDTIME, Disp: 16 g, Rfl: 6   gabapentin (NEURONTIN) 300 MG capsule, Take 1 capsule (300 mg total) by mouth 2 (two) times daily., Disp: 60 capsule, Rfl: 11   glimepiride (AMARYL) 1 MG tablet, TAKE 2 TABLETS (2 MG TOTAL) BY MOUTH DAILY WITH BREAKFAST, Disp: 180 tablet, Rfl: 0   HYDROcodone bit-homatropine (HYCODAN) 5-1.5 MG/5ML syrup, Take 5 mLs by mouth every 4 (four) hours as needed., Disp: 240 mL, Rfl: 0   HYDROcodone-acetaminophen (  NORCO) 10-325 MG tablet, Take 1 tablet by mouth every 6 (six) hours as needed for moderate pain (pain score 4-6)., Disp: 120 tablet, Rfl: 0   HYDROcodone-acetaminophen (NORCO) 10-325 MG tablet, Take 1 tablet by mouth every 6 (six) hours as needed for moderate pain (pain score 4-6)., Disp: 120 tablet, Rfl: 0   HYDROcodone-acetaminophen (NORCO) 10-325 MG tablet, Take 1 tablet by mouth every 6 (six) hours as needed for moderate pain (pain score 4-6)., Disp: 120 tablet, Rfl: 0   hydrOXYzine (ATARAX) 10 MG tablet, Take 10 mg by mouth every 6 (six) hours as needed., Disp: , Rfl:    JANUVIA 100 MG tablet, TAKE 1 TABLET BY MOUTH EVERY DAY, Disp: 90 tablet, Rfl: 3   ketoconazole (NIZORAL) 2 % cream, Apply 1 Application topically daily. Apply 1gm to bilateral foot  once daily, Disp: 60 g, Rfl: 2   KLOR-CON M20 20 MEQ tablet, TAKE 1 TABLET BY MOUTH TWICE A DAY, Disp: 180 tablet, Rfl: 3   levocetirizine (XYZAL) 5 MG tablet, Take 5 mg by mouth every evening., Disp: , Rfl:    levofloxacin (LEVAQUIN) 500 MG tablet, Take 1 tablet (500 mg total) by mouth daily., Disp: 10 tablet, Rfl: 0   LIDODERM 5 %, APPLY 1 PATCH TO SKIN FOR 12 HOURS THEN REMOVE AND DISCARD PATCH WITHIN 72 HOURS OR AS DIRECTED, Disp: 30 patch, Rfl: 2   loperamide (IMODIUM) 2 MG capsule, Take 4-6 mg by mouth daily. Taking as needed only, Disp: , Rfl:    losartan (COZAAR) 25 MG tablet, Take 1 tablet (25 mg total) by mouth daily., Disp: 90 tablet, Rfl:  3   magnesium oxide (MAG-OX) 400 MG tablet, TAKE 3 TABLETS (1,200 MG TOTAL) BY MOUTH DAILY., Disp: 90 tablet, Rfl: 5   mesalamine (LIALDA) 1.2 g EC tablet, Take 2 tablets (2.4 g total) by mouth 2 (two) times daily., Disp: 360 tablet, Rfl: 1   metoprolol succinate (TOPROL-XL) 100 MG 24 hr tablet, TAKE 1 TABLET BY MOUTH DAILY. TAKE WITH OR IMMEDIATELY FOLLOWING A MEAL., Disp: 90 tablet, Rfl: 1   montelukast (SINGULAIR) 10 MG tablet, Take one every morning, Disp: 90 tablet, Rfl: 3   nitroGLYCERIN (NITROSTAT) 0.4 MG SL tablet, PLACE 1 TABLET UNDER THE TONGUE EVERY 5 MINUTES AS NEEDED FOR CHEST PAIN., Disp: 75 tablet, Rfl: 2   Olopatadine HCl 0.7 % SOLN, Place 1 drop into both eyes daily as needed (itching/allergies)., Disp: , Rfl:    OneTouch Delica Lancets 33G MISC, 1 each by Does not apply route daily at 12 noon., Disp: 100 each, Rfl: 0   ONETOUCH VERIO test strip, USE TO CHECK BLOOD GLUCOSE ONCE DAILY, Disp: 100 strip, Rfl: 1   Pancrelipase, Lip-Prot-Amyl, (ZENPEP) 40000-126000 units CPEP, Take by mouth. Take 2 tablets by mouth before meals and 1 tablet before snacks, Disp: , Rfl:    pantoprazole (PROTONIX) 40 MG tablet, TAKE 1 TABLET BY MOUTH TWICE A DAY, Disp: 180 tablet, Rfl: 0   Polyethyl Glycol-Propyl Glycol (SYSTANE OP), Place 1 drop into both eyes daily. For dry eyes, Disp: , Rfl:    tiZANidine (ZANAFLEX) 4 MG tablet, Take 4 mg by mouth 2 (two) times daily., Disp: , Rfl:    torsemide (DEMADEX) 20 MG tablet, TAKE 2 TABLETS BY MOUTH EVERY DAY, Disp: 180 tablet, Rfl: 0   Vitamin D, Ergocalciferol, (DRISDOL) 1.25 MG (50000 UNIT) CAPS capsule, Take 50,000 Units by mouth every 14 (fourteen) days., Disp: , Rfl:    ZENPEP 20000-63000 units CPEP, TAKE 2  CAPSULES (40,000 UNITS) WITH MEALS AND 1 CAPSULE (20,000 UNITS) WITH A SNACK, Disp: 240 capsule, Rfl: 1  Current Facility-Administered Medications:    ipratropium-albuterol (DUONEB) 0.5-2.5 (3) MG/3ML nebulizer solution 3 mL, 3 mL, Nebulization, Once,  Nafziger, Kandee Keen, NP  EXAM:  VITALS per patient if applicable:  GENERAL: alert, oriented, appears well and in no acute distress  HEENT: atraumatic, conjunttiva clear, no obvious abnormalities on inspection of external nose and ears  NECK: normal movements of the head and neck  LUNGS: on inspection no signs of respiratory distress, breathing rate appears normal, no obvious gross SOB, gasping or wheezing  CV: no obvious cyanosis  MS: moves all visible extremities without noticeable abnormality  PSYCH/NEURO: pleasant and cooperative, no obvious depression or anxiety, speech and thought processing grossly intact  ASSESSMENT AND PLAN: Pain management.  Indication for chronic opioid: low back pain Medication and dose: Hydromorphone 4 mg # pills per month: 120 Last UDS date: 06-29-22 Opioid Treatment Agreement signed (Y/N): 10-28-18 Opioid Treatment Agreement last reviewed with patient:  07-02-23 [facility] reviewed this encounter (include red flags): Yes We will stop the Norco and she will try Hydromorphone 4mg  instead. She will come into the clinic to follow up in one month, and we will obtain another UDS at that time.  Gershon Crane, MD   Discussed the following assessment and plan:  No diagnosis found.     I discussed the assessment and treatment plan with the patient. The patient was provided an opportunity to ask questions and all were answered. The patient agreed with the plan and demonstrated an understanding of the instructions.   The patient was advised to call back or seek an in-person evaluation if the symptoms worsen or if the condition fails to improve as anticipated.      Review of Systems     Objective:   Physical Exam        Assessment & Plan:

## 2023-07-03 ENCOUNTER — Telehealth (INDEPENDENT_AMBULATORY_CARE_PROVIDER_SITE_OTHER): Payer: Self-pay | Admitting: Audiology

## 2023-07-03 ENCOUNTER — Encounter: Payer: Self-pay | Admitting: Podiatry

## 2023-07-03 NOTE — Telephone Encounter (Signed)
 Tried calling pt to confirm appt and address for 07/04/2023, answered but no response.

## 2023-07-04 ENCOUNTER — Ambulatory Visit (INDEPENDENT_AMBULATORY_CARE_PROVIDER_SITE_OTHER): Payer: PPO | Admitting: Audiology

## 2023-07-04 DIAGNOSIS — H903 Sensorineural hearing loss, bilateral: Secondary | ICD-10-CM | POA: Diagnosis not present

## 2023-07-04 NOTE — Progress Notes (Signed)
  [address], [address] [place], [place] [address] [phone]  Audiological Evaluation    Name: [name]     DOB:   1941-02-09      MRN:   [id number]                                                                                     Service Date: 07/04/2023     Accompanied by: son   Patient comes today after Dr. Allena Katz, ENT sent a referral for a hearing evaluation due to concerns with hearing loss.   Symptoms Yes Details  Hearing loss  [x]  Family reports she turns up the TV louder  Tinnitus  []    Ear pain/ infections/pressure  []    Balance problems  [x]  Reportedly due to back problems.  Noise exposure history  []    Previous ear surgeries  []    Family history of hearing loss  []    Amplification  []    Other  []      Otoscopy: Right ear: Clear external ear canals and notable landmarks visualized on the tympanic membrane. Left ear:  Clear external ear canals and notable landmarks visualized on the tympanic membrane.  Tympanometry: Right ear: Type A- Normal external ear canal volume with normal middle ear pressure and tympanic membrane compliance Left ear: Type Ad- Normal external ear canal volume with normal middle ear pressure and high tympanic membrane compliance    Pure tone Audiometry: Both ears- Normal to moderate sensorineural hearing loss from [phone] .    Speech Audiometry: Right ear- Speech Reception Threshold (SRT) was obtained at 25 dBHL. Left ear-Speech Reception Threshold (SRT) was obtained at 25 dBHL.   Word Recognition Score Tested using NU-6 (MLV) Right ear: 96% was obtained at a presentation level of 75 dBHL with contralateral masking which is deemed as  excellent. Left ear: 100% was obtained at a presentation level of 75 dBHL with contralateral masking which is deemed as  excellent.   The hearing test results were completed under headphones and results are deemed to be of good reliability. Test technique:  conventional      Recommendations: Follow up with ENT as scheduled for today. Return for a hearing evaluation if concerns with hearing changes arise or per MD recommendation. Consider a communication needs assessment, whenever patient is ready.   Roemello Speyer MARIE LEROUX-MARTINEZ, AUD

## 2023-07-05 ENCOUNTER — Telehealth: Payer: Self-pay | Admitting: Gastroenterology

## 2023-07-05 DIAGNOSIS — R197 Diarrhea, unspecified: Secondary | ICD-10-CM

## 2023-07-05 NOTE — Telephone Encounter (Signed)
 Per [employer], a prior authorization is required for patient's budesonide.

## 2023-07-05 NOTE — Telephone Encounter (Signed)
 The pt is now a Dr Leone Payor pt see last refill note

## 2023-07-05 NOTE — Telephone Encounter (Signed)
 [employer] office called requesting to speak with someone about Budesonide medication requesting a call back at [phone] option 2.

## 2023-07-07 ENCOUNTER — Other Ambulatory Visit: Payer: Self-pay | Admitting: Family Medicine

## 2023-07-10 ENCOUNTER — Encounter: Payer: Self-pay | Admitting: Audiology

## 2023-07-12 ENCOUNTER — Other Ambulatory Visit: Payer: Self-pay

## 2023-07-12 MED ORDER — PANTOPRAZOLE SODIUM 40 MG PO TBEC: 40.0000 mg | DELAYED_RELEASE_TABLET | Freq: Two times a day (BID) | ORAL | 0 refills | Status: DC

## 2023-07-12 NOTE — Telephone Encounter (Signed)
 Dr Leone Payor will be taking over seeing this patient since Dr Russella Dar retired. She was last seen 09/2022. I refilled her pantoprazole per pharmacy request.

## 2023-07-16 DIAGNOSIS — J9611 Chronic respiratory failure with hypoxia: Secondary | ICD-10-CM | POA: Diagnosis not present

## 2023-07-16 DIAGNOSIS — J479 Bronchiectasis, uncomplicated: Secondary | ICD-10-CM | POA: Diagnosis not present

## 2023-07-16 DIAGNOSIS — M25511 Pain in right shoulder: Secondary | ICD-10-CM | POA: Diagnosis not present

## 2023-07-21 ENCOUNTER — Encounter: Payer: Self-pay | Admitting: Pulmonary Disease

## 2023-07-21 ENCOUNTER — Other Ambulatory Visit: Payer: Self-pay | Admitting: Internal Medicine

## 2023-07-22 NOTE — Telephone Encounter (Signed)
 I was DOD and refilled a medication  The plan was for this patient to see Dr. Doy Hutching  She needs an appointment with her or an APP in her POD  The budesonide was a temporary medication I believe - please find out how the patient is doing and is she requesting the budesonide refill  See Oct 2024 Russella Dar phone note

## 2023-07-22 NOTE — Telephone Encounter (Signed)
 See the other phone note on her about budesonide  We need to talk to this lady and find out what is going on and she needs f/u Dr. Doy Hutching or an APP in her POD

## 2023-07-22 NOTE — Telephone Encounter (Signed)
 Please advise Sir, thank you.

## 2023-07-23 MED ORDER — DIPHENOXYLATE-ATROPINE 2.5-0.025 MG PO TABS: 1.0000 | ORAL_TABLET | Freq: Two times a day (BID) | ORAL | 0 refills | Status: DC | PRN

## 2023-07-23 NOTE — Telephone Encounter (Signed)
 Called and spoke with patient and her son. Pt is aware that we refilled Lomotil. She is aware that x-ray needs to be completed prior to her appt on 07/30/23. Patient was adamant that she is not constipated, pt states that "she knows when she is constipated". I explained reasoning for x-ray again. Pt will come by the x-ray department at her convenience this week.

## 2023-07-23 NOTE — Telephone Encounter (Signed)
 Returned call to patient and her son. They are needing refill of Lomotil since that is all that controls the patient's diarrhea. Patient only takes Budesonide if she is having a flare up, she just finished Budesonide this month. I advised that patient needs an appt so that they can come up with an updated regimen for her. Patient is not able to come in this week, she has been scheduled to see Dr. Doy Hutching on Tuesday, 07/30/23 at 9:30 am (9:15 am arrival). Will send Lomotil refill to [facility] on [address] Per patient's request. Please advise if OK. Thanks

## 2023-07-23 NOTE — Telephone Encounter (Signed)
 Will refill lomitil, looked at [facility], she last had 06/18/2023.  She had MRI AB 05/2022 that showed large stool burden and constipation. High risk for complication with number of controlled substances, possible overflow.  She is on hydromorphone 4 mg #120 and xanax filled 03/04 #90, previously hydrocodone 10 mg #120 in Jan/Dec from her PCP.  Will refill but please get [facility] to evaluate prior to the meeting with Dr. Doy Hutching for possible overflow, will refill this time but consider not filling next time and other forms of treatment.

## 2023-07-25 ENCOUNTER — Ambulatory Visit
Admission: RE | Admit: 2023-07-25 | Discharge: 2023-07-25 | Disposition: A | Discharge status: Home / Self Care | Source: Ambulatory Visit | Attending: Physician Assistant | Admitting: Physician Assistant

## 2023-07-25 DIAGNOSIS — K5790 Diverticulosis of intestine, part unspecified, without perforation or abscess without bleeding: Secondary | ICD-10-CM | POA: Diagnosis not present

## 2023-07-25 DIAGNOSIS — K529 Noninfective gastroenteritis and colitis, unspecified: Secondary | ICD-10-CM | POA: Diagnosis not present

## 2023-07-25 DIAGNOSIS — R1900 Intra-abdominal and pelvic swelling, mass and lump, unspecified site: Secondary | ICD-10-CM | POA: Diagnosis not present

## 2023-07-25 DIAGNOSIS — K5900 Constipation, unspecified: Secondary | ICD-10-CM | POA: Diagnosis not present

## 2023-07-25 DIAGNOSIS — R197 Diarrhea, unspecified: Secondary | ICD-10-CM

## 2023-07-25 NOTE — Telephone Encounter (Signed)
 See 07/05/23 telephone note for additional information.

## 2023-07-27 ENCOUNTER — Other Ambulatory Visit: Payer: Self-pay | Admitting: Family Medicine

## 2023-07-29 NOTE — Progress Notes (Unsigned)
 [facility] Return Visit   Referring Provider Nelwyn Salisbury, MD [address] [place],  [place] [address]  Primary Care Provider Nelwyn Salisbury, MD  Patient Profile: [name] is a 83 y.o. female with a past medical history noteworthy for CAD, CHF, T2 DM, fibromyalgia, HTN, HLD, OSA, who returns to the [facility] for follow-up of the problem(s) noted below.  Problem List: Left-sided ulcerative colitis diagnosed 2008 Pancreatic insufficiency IBS-D GERD H. pylori gastritis 03/2019  Personal history of tubular adenomas and sessile serrated polyps History of mild normocytic anemia Status post cholecystectomy   History of Present Illness   [name] was last seen in the GI office 10/08/2022 by Dr. Russella Dar   Current GI Meds  Lialda 2.4 g p.o. twice daily Zenpep 40,000 units 2 caps before meals, 1 cap before snacks Pantoprazole 40 mg p.o. twice daily Dicyclomine 10 mg p.o. 3 times daily Imodium 3 times daily as needed Lomotil twice daily as needed  Interval History    Last colonoscopy: 12/2017 -no active inflammation, 3 polyps in DC, AC, ICV (TA' s), 2 medium size lipomas in TC and AC, left-sided diverticula Last endoscopy:  03/2019 -normal esophagus, diffuse moderate inflammation in stomach, 2 gastric papules, normal duodenum -H. pylori gastritis on pathology  Last Abd CT/CTE/MRE: MRI 05/2022 -bilateral renal lesions-probable complex cysts-follow-up recommended 6 to 12 months, possible constipation  GI Review of Symptoms Significant for {GIROS:50592}. Otherwise negative.  General Review of Systems  Review of systems is significant for the pertinent positives and negatives as listed per the HPI.  Full ROS is otherwise negative.  Past Medical History   Past Medical History:  Diagnosis Date   Adenomatous colon polyp 02/2006   Allergy    Allergy, unspecified not elsewhere classified    Anemia    Anxiety    Arthritis     Asthma    Blood transfusion without reported diagnosis    Bronchiectasis    CAD (coronary artery disease)    BMS to the LAD, 2002; cath 12/07/11 patent LAD stent and mild nonobstructive disease, EF 65%; Medical management   Cataract    removed both eyes   CHF (congestive heart failure) (HCC)    Diastolic   Chronic diastolic CHF (congestive heart failure) (HCC) 11/25/2011   Clotting disorder (HCC)    in legs per pt    Diabetes mellitus    Diverticulosis of colon    DJD (degenerative joint disease)    Fibromyalgia    GERD (gastroesophageal reflux disease)    H. pylori infection 05/12/2019   Hypercholesterolemia    Hypertension    Microscopic hematuria    Neoplasm of kidney    Obesity    Other diseases of lung, not elsewhere classified    Pinched vertebral nerve    Pulmonary nodule    Negative PET in 2010   Stress incontinence, female    Syncope    Ulcerative colitis, left sided (HCC) 2008   Hx of   UTI (lower urinary tract infection)    Vitamin D deficiency disease      Past Surgical History   Past Surgical History:  Procedure Laterality Date   ABDOMINAL HYSTERECTOMY     CARDIAC CATHETERIZATION  11/2011   CATARACT EXTRACTION, BILATERAL  2020   CHOLECYSTECTOMY     COLONOSCOPY  01/09/2018   per Dr. Russella Dar, adenomatous polyps, repeat in 3 yrs   CORONARY STENT PLACEMENT  2002   BMS to the LAD   ESOPHAGOGASTRODUODENOSCOPY  12/2008   HAND SURGERY  01/2006   Right Hand Surgery by Dr. Amanda Pea   LEFT HEART CATHETERIZATION WITH CORONARY ANGIOGRAM N/A 12/07/2011   Procedure: LEFT HEART CATHETERIZATION WITH CORONARY ANGIOGRAM;  Surgeon: Peter M Swaziland, MD;  Location: [facility] CATH LAB;  Service: Cardiovascular;  Laterality: N/A;   RIGHT/LEFT HEART CATH AND CORONARY ANGIOGRAPHY N/A 08/07/2021   Procedure: RIGHT/LEFT HEART CATH AND CORONARY ANGIOGRAPHY;  Surgeon: Dolores Patty, MD;  Location: [facility] INVASIVE CV LAB;  Service: Cardiovascular;  Laterality: N/A;   UPPER GASTROINTESTINAL  ENDOSCOPY       Allergies and Medications   Allergies  Allergen Reactions   Doxycycline Other (See Comments)    Unsteady gait   Aspirin Nausea And Vomiting   Azithromycin Other (See Comments)    REACTION: pt states "ZPak doesn't work"   Caffeine Nausea And Vomiting   Ciprofloxacin Nausea And Vomiting    Tolerates levaquin   Codeine Nausea And Vomiting   Oxycodone Other (See Comments)    Pt stated, "upsets my stomach" (11/19/17)   Sulfamethizole Other (See Comments)    Unknown reaction   Tramadol Hcl Other (See Comments)    REACTION: pt states "spaced-out"    @MEDSTODAY @  Family History   Family History  Problem Relation Age of Onset   Pneumonia Father    Heart attack Father    Colon polyps Father    Parkinsonism Mother    Diabetes Brother    Sarcoidosis Brother    Hypertension Sister    Diabetes Sister    Breast cancer Daughter    Colon cancer Neg Hx    Esophageal cancer Neg Hx    Rectal cancer Neg Hx    Stomach cancer Neg Hx    GI Specific Family History: {gifamhx:50061}   Social History   Social History   Tobacco Use   Smoking status: Former    Current packs/day: 0.00    Average packs/day: 2.0 packs/day for 15.0 years (30.0 ttl pk-yrs)    Types: Cigarettes    Start date: 04/30/1958    Quit date: 04/30/1973    Years since quitting: 50.2    Passive exposure: Past   Smokeless tobacco: Never   Tobacco comments:    ex-smoker: smoked for 10-15 years up to 2ppd, quit 1975.  Vaping Use   Vaping status: Never Used  Substance Use Topics   Alcohol use: Not Currently    Alcohol/week: 0.0 standard drinks of alcohol    Comment: none now    Drug use: No   Fatemah reports that she quit smoking about 50 years ago. Her smoking use included cigarettes. She started smoking about 65 years ago. She has a 30 pack-year smoking history. She has been exposed to tobacco smoke. She has never used smokeless tobacco. She reports that she does not currently use alcohol. She reports  that she does not use drugs.  Vital Signs and Physical Examination  There were no vitals filed for this visit. There is no height or weight on file to calculate BMI.    General: Well developed, well nourished, no acute distress Head: Normocephalic and atraumatic Eyes: Sclerae anicteric, EOMI Ears: Normal auditory acuity Mouth: No deformities or lesions noted Lungs: Clear throughout to auscultation Heart: Regular rate and rhythm; No murmurs, rubs or bruits Abdomen: Soft, non tender and non distended. No masses, hepatosplenomegaly or hernias noted. Normal Bowel sounds Rectal: Musculoskeletal: Symmetrical with no gross deformities  Pulses:  Normal pulses noted Extremities: No edema or deformities noted Neurological:  Alert oriented x 4, grossly nonfocal Psychological:  Alert and cooperative. Normal mood and affect   Review of Data  The following data was reviewed at the time of this encounter:  Laboratory Studies      Latest Ref Rng & Units 09/04/2022   10:30 AM 08/16/2021    2:00 PM 08/11/2021    2:34 AM  CBC  WBC 4.0 - 10.5 K/uL 11.2  13.6  12.7   Hemoglobin 12.0 - 15.0 g/dL 62.9  52.8  41.3   Hematocrit 36.0 - 46.0 % 35.4  42.9  39.5   Platelets 150.0 - 400.0 K/uL 532.0  577  413     Lab Results  Component Value Date   LIPASE 25 12/07/2011      Latest Ref Rng & Units 09/04/2022   10:30 AM 11/22/2021   11:30 AM 09/27/2021    9:30 AM  CMP  Glucose 70 - 99 mg/dL 244  010  272   BUN 6 - 23 mg/dL 15  17  21    Creatinine 0.40 - 1.20 mg/dL 5.36  6.44  0.34   Sodium 135 - 145 mEq/L 136  137  137   Potassium 3.5 - 5.1 mEq/L 3.9  4.0  4.2   Chloride 96 - 112 mEq/L 99  102  102   CO2 19 - 32 mEq/L 28  27  28    Calcium 8.4 - 10.5 mg/dL 9.3  9.4  9.4   Total Protein 6.0 - 8.3 g/dL 7.4     Total Bilirubin 0.2 - 1.2 mg/dL 0.4     Alkaline Phos 39 - 117 U/L 79     AST 0 - 37 U/L 10     ALT 0 - 35 U/L 5        Imaging Studies    GI Procedures and Studies     Clinical  Impression  It is my clinical impression that [name] is a 83 y.o. female with;  ***  Plan  *** *** *** *** ***   Planned Follow Up No follow-ups on file.  The patient or caregiver verbalized understanding of the material covered, with no barriers to understanding. All questions were answered. Patient or caregiver is agreeable with the plan outlined above.    It was a pleasure to see Cloee.  If you have any questions or concerns regarding this evaluation, do not hesitate to contact me.  Maren Beach, MD [facility]

## 2023-07-30 ENCOUNTER — Encounter: Payer: Self-pay | Admitting: Pediatrics

## 2023-07-30 ENCOUNTER — Ambulatory Visit: Admitting: Pediatrics

## 2023-07-30 ENCOUNTER — Other Ambulatory Visit (INDEPENDENT_AMBULATORY_CARE_PROVIDER_SITE_OTHER)

## 2023-07-30 VITALS — BP 134/82 | HR 84 | Ht 60.0 in | Wt 216.0 lb

## 2023-07-30 DIAGNOSIS — Z8619 Personal history of other infectious and parasitic diseases: Secondary | ICD-10-CM | POA: Diagnosis not present

## 2023-07-30 DIAGNOSIS — A048 Other specified bacterial intestinal infections: Secondary | ICD-10-CM

## 2023-07-30 DIAGNOSIS — K580 Irritable bowel syndrome with diarrhea: Secondary | ICD-10-CM

## 2023-07-30 DIAGNOSIS — K5180 Other ulcerative colitis without complications: Secondary | ICD-10-CM | POA: Diagnosis not present

## 2023-07-30 DIAGNOSIS — Z9049 Acquired absence of other specified parts of digestive tract: Secondary | ICD-10-CM | POA: Diagnosis not present

## 2023-07-30 DIAGNOSIS — K529 Noninfective gastroenteritis and colitis, unspecified: Secondary | ICD-10-CM

## 2023-07-30 DIAGNOSIS — K219 Gastro-esophageal reflux disease without esophagitis: Secondary | ICD-10-CM | POA: Diagnosis not present

## 2023-07-30 DIAGNOSIS — Z862 Personal history of diseases of the blood and blood-forming organs and certain disorders involving the immune mechanism: Secondary | ICD-10-CM

## 2023-07-30 DIAGNOSIS — Z860101 Personal history of adenomatous and serrated colon polyps: Secondary | ICD-10-CM | POA: Diagnosis not present

## 2023-07-30 DIAGNOSIS — R197 Diarrhea, unspecified: Secondary | ICD-10-CM

## 2023-07-30 DIAGNOSIS — K8681 Exocrine pancreatic insufficiency: Secondary | ICD-10-CM | POA: Diagnosis not present

## 2023-07-30 LAB — CBC WITH DIFFERENTIAL/PLATELET
Basophils Absolute: 0.1 10*3/uL (ref 0.0–0.1)
Basophils Relative: 0.5 % (ref 0.0–3.0)
Eosinophils Absolute: 0.1 10*3/uL (ref 0.0–0.7)
Eosinophils Relative: 0.7 % (ref 0.0–5.0)
HCT: 39 % (ref 36.0–46.0)
Hemoglobin: 12.7 g/dL (ref 12.0–15.0)
Lymphocytes Relative: 9.3 % — ABNORMAL LOW (ref 12.0–46.0)
Lymphs Abs: 1.4 10*3/uL (ref 0.7–4.0)
MCHC: 32.5 g/dL (ref 30.0–36.0)
MCV: 91.8 fl (ref 78.0–100.0)
Monocytes Absolute: 1 10*3/uL (ref 0.1–1.0)
Monocytes Relative: 6.6 % (ref 3.0–12.0)
Neutro Abs: 12.7 10*3/uL — ABNORMAL HIGH (ref 1.4–7.7)
Neutrophils Relative %: 82.9 % — ABNORMAL HIGH (ref 43.0–77.0)
Platelets: 455 10*3/uL — ABNORMAL HIGH (ref 150.0–400.0)
RBC: 4.25 Mil/uL (ref 3.87–5.11)
RDW: 14.5 % (ref 11.5–15.5)
WBC: 15.3 10*3/uL — ABNORMAL HIGH (ref 4.0–10.5)

## 2023-07-30 LAB — SEDIMENTATION RATE: Sed Rate: 18 mm/h (ref 0–30)

## 2023-07-30 LAB — COMPREHENSIVE METABOLIC PANEL WITH GFR
ALT: 10 U/L (ref 0–35)
AST: 10 U/L (ref 0–37)
Albumin: 3.8 g/dL (ref 3.5–5.2)
Alkaline Phosphatase: 80 U/L (ref 39–117)
BUN: 24 mg/dL — ABNORMAL HIGH (ref 6–23)
CO2: 25 meq/L (ref 19–32)
Calcium: 8.8 mg/dL (ref 8.4–10.5)
Chloride: 99 meq/L (ref 96–112)
Creatinine, Ser: 0.78 mg/dL (ref 0.40–1.20)
GFR: 71.44 mL/min (ref >60.00)
Glucose, Bld: 357 mg/dL — ABNORMAL HIGH (ref 70–99)
Potassium: 4.1 meq/L (ref 3.5–5.1)
Sodium: 133 meq/L — ABNORMAL LOW (ref 135–145)
Total Bilirubin: 0.5 mg/dL (ref 0.2–1.2)
Total Protein: 7 g/dL (ref 6.0–8.3)

## 2023-07-30 LAB — C-REACTIVE PROTEIN: CRP: <1 mg/dL (ref 0.5–20.0)

## 2023-07-30 LAB — VITAMIN D 25 HYDROXY (VIT D DEFICIENCY, FRACTURES): VITD: 41.31 ng/mL (ref 30.00–100.00)

## 2023-07-30 NOTE — Patient Instructions (Signed)
 Your provider has requested that you go to the basement level for lab work before leaving today. Press "B" on the elevator. The lab is located at the first door on the left as you exit the elevator.   Due to recent changes in healthcare laws, you may see the results of your imaging and laboratory studies on MyChart before your provider has had a chance to review them.  We understand that in some cases there may be results that are confusing or concerning to you. Not all laboratory results come back in the same time frame and the provider may be waiting for multiple results in order to interpret others.  Please give [place] 48 hours in order for your provider to thoroughly review all the results before contacting the office for clarification of your results.    Thank you for entrusting me with your care and for choosing [facility], Dr. Maren Beach   _______________________________________________________  If your blood pressure at your visit was 140/90 or greater, please contact your primary care physician to follow up on this.  _______________________________________________________  If you are age 81 or older, your body mass index should be between 23-30. Your Body mass index is 42.18 kg/m. If this is out of the aforementioned range listed, please consider follow up with your Primary Care Provider.  If you are age 45 or younger, your body mass index should be between 19-25. Your Body mass index is 42.18 kg/m. If this is out of the aformentioned range listed, please consider follow up with your Primary Care Provider.   ________________________________________________________  The [facility] providers would like to encourage you to use [employer] to communicate with providers for non-urgent requests or questions.  Due to long hold times on the telephone, sending your provider a message by [facility] may be a faster and more efficient way to get a response.  Please allow 48 business hours for a  response.  Please remember that this is for non-urgent requests.  _______________________________________________________

## 2023-07-31 DIAGNOSIS — Z8619 Personal history of other infectious and parasitic diseases: Secondary | ICD-10-CM | POA: Diagnosis not present

## 2023-07-31 DIAGNOSIS — R197 Diarrhea, unspecified: Secondary | ICD-10-CM | POA: Diagnosis not present

## 2023-08-01 ENCOUNTER — Other Ambulatory Visit

## 2023-08-01 DIAGNOSIS — R197 Diarrhea, unspecified: Secondary | ICD-10-CM | POA: Diagnosis not present

## 2023-08-01 DIAGNOSIS — Z8619 Personal history of other infectious and parasitic diseases: Secondary | ICD-10-CM

## 2023-08-02 DIAGNOSIS — J984 Other disorders of lung: Secondary | ICD-10-CM | POA: Diagnosis not present

## 2023-08-06 ENCOUNTER — Encounter: Payer: Self-pay | Admitting: Pediatrics

## 2023-08-06 LAB — FECAL FAT, QUALITATIVE

## 2023-08-06 LAB — CALPROTECTIN, FECAL: Calprotectin, Fecal: 291 ug/g — ABNORMAL HIGH (ref 0–120)

## 2023-08-08 ENCOUNTER — Ambulatory Visit (INDEPENDENT_AMBULATORY_CARE_PROVIDER_SITE_OTHER): Admitting: Family Medicine

## 2023-08-08 ENCOUNTER — Encounter: Payer: Self-pay | Admitting: Pediatrics

## 2023-08-08 ENCOUNTER — Other Ambulatory Visit: Payer: Self-pay

## 2023-08-08 ENCOUNTER — Encounter: Payer: Self-pay | Admitting: Family Medicine

## 2023-08-08 VITALS — BP 118/70 | HR 68 | Temp 98.2°F | Wt 216.0 lb

## 2023-08-08 DIAGNOSIS — M5441 Lumbago with sciatica, right side: Secondary | ICD-10-CM

## 2023-08-08 DIAGNOSIS — G8929 Other chronic pain: Secondary | ICD-10-CM

## 2023-08-08 DIAGNOSIS — E119 Type 2 diabetes mellitus without complications: Secondary | ICD-10-CM

## 2023-08-08 DIAGNOSIS — F1190 Opioid use, unspecified, uncomplicated: Secondary | ICD-10-CM

## 2023-08-08 DIAGNOSIS — M5442 Lumbago with sciatica, left side: Secondary | ICD-10-CM

## 2023-08-08 LAB — PANCREATIC ELASTASE, FECAL: Pancreatic Elastase-1, Stool: >800 ug/g (ref >200)

## 2023-08-08 MED ORDER — ONETOUCH VERIO VI STRP: ORAL_STRIP | 1 refills | Status: DC

## 2023-08-08 MED ORDER — HYDROMORPHONE HCL 8 MG PO TABS: 8.0000 mg | ORAL_TABLET | Freq: Four times a day (QID) | ORAL | 0 refills | Status: DC | PRN

## 2023-08-08 MED ORDER — HYDROMORPHONE HCL 8 MG PO TABS
8.0000 mg | ORAL_TABLET | Freq: Four times a day (QID) | ORAL | 0 refills | Status: DC | PRN
Start: 2023-08-08 — End: 2023-08-13

## 2023-08-08 NOTE — Progress Notes (Signed)
   Subjective:    Patient ID: [name], female    DOB: 03-17-1941, 83 y.o.   MRN: [id number]  HPI Here with her son for pain management. She has been trying Hydromorphone 4 mg but she says this is not strong enough. She is providing a urine sample for a UDS today.    Review of Systems  Constitutional: Negative.   Musculoskeletal:  Positive for back pain.       Objective:   Physical Exam Constitutional:      Comments: Walks with a walker   Neurological:     Mental Status: She is alert.           Assessment & Plan:  Pain management.  Indication for chronic opioid: low back pain Medication and dose: Hydromorphone 8 mg # pills per month: 120 Last UDS date: 08-08-23 Opioid Treatment Agreement signed (Y/N): 10-28-18 Opioid Treatment Agreement last reviewed with patient:  08-08-23 [facility] reviewed this encounter (include red flags): Yes We will increase the Hydromorphone to 8 mg as needed.  Gershon Crane, MD

## 2023-08-10 LAB — DRUG MONITORING, PANEL 8 WITH CONFIRMATION, URINE
6 Acetylmorphine: NEGATIVE ng/mL (ref <10)
Alcohol Metabolites: POSITIVE ng/mL — AB (ref <500)
Amphetamines: NEGATIVE ng/mL (ref <500)
Benzodiazepines: NEGATIVE ng/mL (ref <100)
Buprenorphine, Urine: NEGATIVE ng/mL (ref <5)
Cocaine Metabolite: NEGATIVE ng/mL (ref <150)
Codeine: NEGATIVE ng/mL (ref <50)
Creatinine: 41.4 mg/dL (ref >=20.0)
Ethyl Glucuronide (ETG): 8179 ng/mL — ABNORMAL HIGH (ref <500)
Ethyl Sulfate (ETS): 2033 ng/mL — ABNORMAL HIGH (ref <100)
Hydrocodone: NEGATIVE ng/mL (ref <50)
Hydromorphone: 545 ng/mL — ABNORMAL HIGH (ref <50)
MDMA: NEGATIVE ng/mL (ref <500)
Marijuana Metabolite: NEGATIVE ng/mL (ref <20)
Morphine: NEGATIVE ng/mL (ref <50)
Norhydrocodone: NEGATIVE ng/mL (ref <50)
Opiates: POSITIVE ng/mL — AB (ref <100)
Oxidant: NEGATIVE ug/mL (ref <200)
Oxycodone: NEGATIVE ng/mL (ref <100)
pH: 5.3 (ref 4.5–9.0)

## 2023-08-10 LAB — DM TEMPLATE

## 2023-08-13 ENCOUNTER — Telehealth: Payer: Self-pay | Admitting: *Deleted

## 2023-08-13 MED ORDER — HYDROMORPHONE HCL 4 MG PO TABS: 4.0000 mg | ORAL_TABLET | Freq: Four times a day (QID) | ORAL | 0 refills | Status: DC | PRN

## 2023-08-13 NOTE — Telephone Encounter (Signed)
 Copied from CRM [id number]. Topic: Clinical - Medication Question >> Aug 12, 2023  1:40 PM Adonis Hoot wrote: Reason for CRM: Patients son [name] is requesting a phone call to discuss one of his moms medications that Dr Alyne Babinski just changed

## 2023-08-13 NOTE — Telephone Encounter (Signed)
 Pt son called the office state that pt tried taking Hydromorphone 8 mg but did not like how she felt. Request to have Rx changed back to 4 mg as before. Please advise

## 2023-08-13 NOTE — Telephone Encounter (Signed)
 Please call pt son on mobile

## 2023-08-13 NOTE — Telephone Encounter (Signed)
Done for one month  ?

## 2023-08-13 NOTE — Telephone Encounter (Signed)
 Attempted to call pt but the call keep dropping, will try later

## 2023-08-16 DIAGNOSIS — J9611 Chronic respiratory failure with hypoxia: Secondary | ICD-10-CM | POA: Diagnosis not present

## 2023-08-16 DIAGNOSIS — J479 Bronchiectasis, uncomplicated: Secondary | ICD-10-CM | POA: Diagnosis not present

## 2023-08-21 ENCOUNTER — Other Ambulatory Visit: Payer: Self-pay | Admitting: Family Medicine

## 2023-08-21 ENCOUNTER — Telehealth: Payer: Self-pay | Admitting: Pediatrics

## 2023-08-21 NOTE — Telephone Encounter (Signed)
 PT needs a refill for lomotil  sent to [facility] on [address].

## 2023-08-23 MED ORDER — DIPHENOXYLATE-ATROPINE 2.5-0.025 MG PO TABS: 1.0000 | ORAL_TABLET | Freq: Two times a day (BID) | ORAL | 0 refills | Status: DC | PRN

## 2023-08-23 NOTE — Telephone Encounter (Signed)
 Refill prescription faxed.

## 2023-08-24 ENCOUNTER — Other Ambulatory Visit: Payer: Self-pay | Admitting: Internal Medicine

## 2023-08-26 ENCOUNTER — Encounter: Payer: Self-pay | Admitting: Pediatrics

## 2023-08-27 ENCOUNTER — Other Ambulatory Visit (HOSPITAL_COMMUNITY): Payer: Self-pay

## 2023-08-27 ENCOUNTER — Telehealth: Payer: Self-pay

## 2023-08-27 DIAGNOSIS — A048 Other specified bacterial intestinal infections: Secondary | ICD-10-CM

## 2023-08-27 MED ORDER — TALICIA 250-12.5-10 MG PO CPDR: 4.0000 | DELAYED_RELEASE_CAPSULE | Freq: Three times a day (TID) | ORAL | 0 refills | Status: AC

## 2023-08-27 MED ORDER — TALICIA 250-12.5-10 MG PO CPDR: 4.0000 | DELAYED_RELEASE_CAPSULE | Freq: Three times a day (TID) | ORAL | Status: DC

## 2023-08-27 NOTE — Telephone Encounter (Signed)
 Called and spoke with patient's son [name] (patient was present for the call). We reviewed stool study and x-ray results as outlined below. [name] informed me that patient has been holding Budesonide  and symptoms are about the same, no better or worse. [name] states that he does not think that patient is constipated at this time, they think that it was likely due to her use of Imodium which they have since stopped. They are in agreeance to hold Magnesium  supplement. [name] wants to do whatever is necessary to help his mom get better. They would like to complete H. Pylori treatment first to see if that helps improve patient's symptoms. If patient has continued symptoms they may be interested in trying Colestid. I advised that [name] will likely need a PA, [name] stated that they have been having issues with prescription coverage. I informed him that we will have our PA team check if PA is needed and see what the co-pay will be. If it is not cost effective we will send in components of [name] instead. [name] has been advised that patient should continue Pantoprazole  40 mg BID. I informed [name] that we typically check for eradication about 4-8 weeks after patient completes treatment. [name] has been advised that I will send message to Dr. Yvone Herd with update to see if there is anything additional that she recommends at this time. [name] verbalized understanding and had no concerns at the end of the call.

## 2023-08-27 NOTE — Telephone Encounter (Signed)
 Hi Rouser family -   I received the last of [name]'s stool test today and wanted to send you an update on the results so we can coordinate a plan moving forward.   As we discussed at the time of her clinic visit, she carries multiple conditions that can all contribute to diarrhea: Ulcerative colitis Irritable bowel syndrome Exocrine pancreatic insufficiency Magnesium  use Lactose intolerance Constipation seen on MRI and abdominal x-ray that can lead to overflow diarrhea Prior gallbladder removal which can result in bile acid diarrhea   Laboratory testing for exocrine pancreatic insufficiency was normal -this does not appear to be contributing to her diarrhea at this time and she can continue on her current doses of Zenpep    Her fecal calprotectin test that measures inflammation in the bowel related to ulcerative colitis was slightly elevated at 291.  Her previous value in May 2024 was 943.  It is definitely lower than it was 1 year ago.  Inflammation related to her ulcerative colitis may or may not be contributing to some of her current symptoms.  When you were in the office we discussed holding her budesonide .  Please let me know what is happening with her symptoms since staying off of budesonide  -are things better, worse or the same?   The final result of her abdominal x-ray returned and the radiologist does think that she has constipation as was seen on her prior MRI.  When hard stool builds up in the colon, liquid stool can sometimes seep around hard stool formations and come out of the rectum as liquid or incontinence.  Patient's mistake this for diarrhea when what is really happening based on the x-ray that there is constipation.  The treatment for this is to do a bowel cleanout.  This may be something to consider to address the diarrhea.   I did communicate with Dr. Alyne Babinski and he was comfortable with discontinuing magnesium  oxide as this could be worsening diarrhea.  He thought it would be okay  to just stop the magnesium  supplements.  You can discuss this further with him if you would like.  If your mother does need magnesium  supplements we could transition to another formulation called magnesium  glycinate that contributes to less diarrhea.   The other thing I was thinking about was that your mother had her gallbladder removed many years ago.  Some patients can develop forms of bile acid diarrhea related to gallbladder removal that is treatable with a medication called Colestid.  If addressing some of the issues I have outlined above is not helpful we could explore this further.   Please send an update regarding how your mom is doing off of budesonide  and we can figure out next steps together.

## 2023-08-27 NOTE — Telephone Encounter (Signed)
 William notified. See alternate telephone encounter for details.

## 2023-08-27 NOTE — Telephone Encounter (Signed)
 Pharmacy Patient Advocate Encounter   Received notification from [facility] [employer] that Prior Authorization for Talicia 250-12.5-10MG  dr capsules has been APPROVED from 08-27-2023 to 04-29-2024    PA #/Case ID/Reference #: [name] and notified him of approval and cost of Talicia ($46.67). Will repeat H. Pylori stool test 4-8 weeks after completion of treatment. Reminder in epic.

## 2023-08-27 NOTE — Telephone Encounter (Signed)
 Pharmacy Patient Advocate Encounter   Received notification from Pt Calls Messages that prior authorization for Talicia 250-12.5-10MG  dr capsules is required/requested.   Insurance verification completed.   The patient is insured through [facility] [employer]ANT[employer]/RX ADVANCE .   Per test claim: PA required; PA submitted to above mentioned insurance via CoverMyMeds Key/confirmation #/EOC [id number] Status is pending

## 2023-08-27 NOTE — Telephone Encounter (Signed)
 PA request has been Submitted. New Encounter has been or will be created for follow up. For additional info see Pharmacy Prior Auth telephone encounter from 08-27-2023.

## 2023-08-27 NOTE — Telephone Encounter (Signed)
 PA team:   Please start PA for Talicia prescription. Please see previous failed regimen below, patient is also allergic to Ciprofloxacin. Thanks  H. pylori gastritis 2020 -- Diagnosed endoscopically on EGD biopsies -- Did not tolerate quadruple therapy -- Seen by ID and treated with amoxicillin  1 g p.o. twice daily, Levaquin  500 mg daily and PPI 40 mg p.o. twice daily -- No test of cure documented on chart -advised updated [facility] H. pylori stool antigen testing

## 2023-08-27 NOTE — Telephone Encounter (Signed)
 See 4/29 telephone encounter for details

## 2023-08-27 NOTE — Telephone Encounter (Signed)
 Do you happen to know how much this will cost the patient?

## 2023-08-27 NOTE — Telephone Encounter (Signed)
 I am writing to let you know that I received [name]'s H. pylori stool test result that was positive for H. pylori.  The test indicates that the strain of bacteria in her system is resistant to an antibiotic called clarithromycin .  Her last treatment regimen clued an antibiotic called levofloxacin  which is related to clarithromycin  and may explain her persistently positive test.   When patients have a persistently positive H. pylori test, it is recommended that they undergo treatment with an antibiotic regimen that is different from the prior one.  We will avoid antibiotics associated with clarithromycin  and levofloxacin  given that her strain is resistant to these.   In reviewing her chart, I see she did not tolerate bismuth  quadruple therapy.   I will request that my office send in a prescription for a different treatment that includes amoxicillin  and rifabutin.  There is a commercial formulation available called Talicia that contains both amoxicillin  and rifabutin.  If this is not covered by [employer] we can try to submit prescriptions for both antibiotics separately.

## 2023-08-27 NOTE — Telephone Encounter (Signed)
 Pharmacy Patient Advocate Encounter  Received notification from [facility] [employer]/RX [employer] that Prior Authorization for Talicia 250-12.5-10MG  dr capsules has been APPROVED from 08-27-2023 to 04-29-2024   PA #/Case ID/Reference #: [name]

## 2023-08-29 ENCOUNTER — Other Ambulatory Visit: Payer: Self-pay | Admitting: Family Medicine

## 2023-08-30 ENCOUNTER — Telehealth: Payer: Self-pay | Admitting: Podiatry

## 2023-08-30 NOTE — Telephone Encounter (Signed)
LVM TO  RESCHED

## 2023-09-03 ENCOUNTER — Other Ambulatory Visit: Payer: Self-pay | Admitting: Family Medicine

## 2023-09-03 NOTE — Telephone Encounter (Signed)
 Copied from CRM [id number]. Topic: Clinical - Medication Refill >> Sep 03, 2023  4:56 PM Luane Rumps D wrote: Most Recent Primary Care Visit:  Provider: Corita Diego A  Department: [facility]  Visit Type: OFFICE VISIT  Date: 08/08/2023  Medication: HYDROmorphone  (DILAUDID ) 4 MG tablet  Has the patient contacted their pharmacy? Yes (Agent: If no, request that the patient contact the pharmacy for the refill. If patient does not wish to contact the pharmacy document the reason why and proceed with request.) (Agent: If yes, when and what did the pharmacy advise?) Pharmacy out of medication  Is this the correct pharmacy for this prescription? Yes If no, delete pharmacy and type the correct one.  This is the patient's preferred pharmacy:   [facility] [address], [place] - [address] [place] [address] Phone: [phone] Fax: [phone]    Has the prescription been filled recently? Yes  Is the patient out of the medication? Yes  Has the patient been seen for an appointment in the last year OR does the patient have an upcoming appointment? Yes  Can we respond through MyChart? No  Agent: Please be advised that Rx refills may take up to 3 business days. We ask that you follow-up with your pharmacy.

## 2023-09-04 MED ORDER — HYDROMORPHONE HCL 4 MG PO TABS: 4.0000 mg | ORAL_TABLET | Freq: Four times a day (QID) | ORAL | 0 refills | Status: DC | PRN

## 2023-09-04 NOTE — Telephone Encounter (Signed)
 Last Fill: 08/13/23  Last OV: 08/08/23 Next OV: 06/05/24 AWV  Routing to provider for review/authorization.

## 2023-09-04 NOTE — Telephone Encounter (Signed)
 Done

## 2023-09-12 ENCOUNTER — Ambulatory Visit
Admission: RE | Admit: 2023-09-12 | Discharge: 2023-09-12 | Disposition: A | Discharge status: Home / Self Care | Source: Ambulatory Visit | Attending: Urology | Admitting: Urology

## 2023-09-12 DIAGNOSIS — N281 Cyst of kidney, acquired: Secondary | ICD-10-CM | POA: Diagnosis not present

## 2023-09-12 MED ORDER — GADOPICLENOL 0.5 MMOL/ML IV SOLN
10.0000 mL | Freq: Once | INTRAVENOUS | Status: AC | PRN
Start: 2023-09-12 — End: 2023-09-12
  Administered 2023-09-12: 10 mL via INTRAVENOUS

## 2023-09-13 ENCOUNTER — Other Ambulatory Visit: Payer: Self-pay | Admitting: Family Medicine

## 2023-09-13 ENCOUNTER — Telehealth: Payer: Self-pay

## 2023-09-13 ENCOUNTER — Other Ambulatory Visit: Payer: Self-pay | Admitting: Pediatrics

## 2023-09-13 ENCOUNTER — Telehealth: Payer: Self-pay | Admitting: Pulmonary Disease

## 2023-09-13 MED ORDER — ZENPEP 20000-63000 UNITS PO CPEP: ORAL_CAPSULE | ORAL | 1 refills | Status: DC

## 2023-09-13 NOTE — Telephone Encounter (Signed)
 Rc'd CMN for O2 but PT not seen in over a year. Fax'd CMN back with this info.

## 2023-09-13 NOTE — Telephone Encounter (Signed)
 Zenpep  sent to pharmacist.

## 2023-09-15 DIAGNOSIS — J479 Bronchiectasis, uncomplicated: Secondary | ICD-10-CM | POA: Diagnosis not present

## 2023-09-15 DIAGNOSIS — J9611 Chronic respiratory failure with hypoxia: Secondary | ICD-10-CM | POA: Diagnosis not present

## 2023-09-16 ENCOUNTER — Telehealth: Payer: Self-pay | Admitting: Pulmonary Disease

## 2023-09-16 NOTE — Telephone Encounter (Signed)
 Plan of care needed for this PT to renew resperator usage

## 2023-09-16 NOTE — Telephone Encounter (Signed)
 They will be faxing a request today to have Dr. Teddy Fear and do a renewed Plan of Care.

## 2023-09-17 ENCOUNTER — Ambulatory Visit: Payer: PPO | Admitting: Podiatry

## 2023-09-18 ENCOUNTER — Telehealth: Payer: Self-pay | Admitting: Adult Health

## 2023-09-18 NOTE — Telephone Encounter (Signed)
 Cmn received from [facility] for new order for ventilator.

## 2023-09-18 NOTE — Telephone Encounter (Signed)
 Patient son called and stated that his mother is out of her medication Zenpep . Patient stated that they pharmacist will not release the medication until they are able to hear back from the provider. Patient son is requesting that we call him back letting him know that the medication has been sent. Please advise.

## 2023-09-19 ENCOUNTER — Other Ambulatory Visit: Payer: Self-pay

## 2023-09-19 MED ORDER — ZENPEP 20000-63000 UNITS PO CPEP: ORAL_CAPSULE | ORAL | 1 refills | Status: DC

## 2023-09-19 NOTE — Telephone Encounter (Signed)
 I spoke to [name] and I advised him that the prescription was sent electronically today.  I also explained that the requested was sent back to the pharmacy on Friday.  I apologized to him that it has taken so many attempts to get this over to the pharmacy.  I advised him to call me back if the pharmacy still says they have not received anything from us .

## 2023-09-25 ENCOUNTER — Other Ambulatory Visit: Payer: Self-pay | Admitting: Family Medicine

## 2023-09-25 NOTE — Telephone Encounter (Signed)
 Copied from CRM [id number]. Topic: Clinical - Medication Refill >> Sep 25, 2023 11:59 AM [name] wrote: Medication: HYDROmorphone  (DILAUDID ) 4 MG tablet  Has the patient contacted their pharmacy? Yes (Agent: If no, request that the patient contact the pharmacy for the refill. If patient does not wish to contact the pharmacy document the reason why and proceed with request.) (Agent: If yes, when and what did the pharmacy advise?)  This is the patient's preferred pharmacy:   [facility] [address] [place] - [address] [place] [address] Phone: [phone] Fax: [phone]   Is this the correct pharmacy for this prescription? Yes If no, delete pharmacy and type the correct one.   Has the prescription been filled recently? Yes  Is the patient out of the medication? Yes  Has the patient been seen for an appointment in the last year OR does the patient have an upcoming appointment? Yes  Can we respond through MyChart? Yes  Agent: Please be advised that Rx refills may take up to 3 business days. We ask that you follow-up with your pharmacy.

## 2023-09-25 NOTE — Telephone Encounter (Signed)
 NFN

## 2023-09-27 ENCOUNTER — Other Ambulatory Visit: Payer: Self-pay | Admitting: Family Medicine

## 2023-09-27 NOTE — Telephone Encounter (Signed)
 Last Fill: 09/04/23  Last OV: 08/08/23 Next OV: 06/05/24 AWV  Routing to provider for review/authorization.   Copied from CRM [id number]. Topic: Clinical - Medication Refill >> Sep 25, 2023 11:59 AM Dimple Francis wrote: Medication: HYDROmorphone  (DILAUDID ) 4 MG tablet  Has the patient contacted their pharmacy? Yes (Agent: If no, request that the patient contact the pharmacy for the refill. If patient does not wish to contact the pharmacy document the reason why and proceed with request.) (Agent: If yes, when and what did the pharmacy advise?)  This is the patient's preferred pharmacy:   [facility] [address] [place] - [address] [place] [address] Phone: [phone] Fax: [phone]   Is this the correct pharmacy for this prescription? Yes If no, delete pharmacy and type the correct one.   Has the prescription been filled recently? Yes  Is the patient out of the medication? Yes  Has the patient been seen for an appointment in the last year OR does the patient have an upcoming appointment? Yes  Can we respond through MyChart? Yes  Agent: Please be advised that Rx refills may take up to 3 business days. We ask that you follow-up with your pharmacy. >> Sep 27, 2023 12:14 PM Alysia Jumbo S wrote: Patient's son, Sharmain Lastra, calling to check the status of med refill. Advised that med refills can take up to 3 business days. He request that medication be sent today, due to patient being out of medication.

## 2023-09-30 DIAGNOSIS — J4540 Moderate persistent asthma, uncomplicated: Secondary | ICD-10-CM | POA: Diagnosis not present

## 2023-09-30 DIAGNOSIS — J309 Allergic rhinitis, unspecified: Secondary | ICD-10-CM | POA: Diagnosis not present

## 2023-09-30 DIAGNOSIS — J019 Acute sinusitis, unspecified: Secondary | ICD-10-CM | POA: Diagnosis not present

## 2023-09-30 DIAGNOSIS — K219 Gastro-esophageal reflux disease without esophagitis: Secondary | ICD-10-CM | POA: Diagnosis not present

## 2023-09-30 NOTE — Telephone Encounter (Signed)
 FYI Done on 09/04/23, please sign encounter

## 2023-10-01 ENCOUNTER — Other Ambulatory Visit: Payer: Self-pay | Admitting: Pediatrics

## 2023-10-04 ENCOUNTER — Telehealth: Payer: Self-pay | Admitting: Pediatrics

## 2023-10-04 NOTE — Telephone Encounter (Signed)
 I spokr to [place] at [facility] and she confirmed that they received the faxed prescription for Lomotil .

## 2023-10-04 NOTE — Telephone Encounter (Signed)
 Patient son called and stated that his mother has ran out of Lomitol and his mother is needing this medication. Patient son stated that when he spoke to the pharmacy they advise him that his request to refill that medication has been denied. Patient is requesting a call back stated that the medication has been sent over to the pharmacy. Please advise.

## 2023-10-08 ENCOUNTER — Telehealth: Payer: Self-pay

## 2023-10-08 DIAGNOSIS — A048 Other specified bacterial intestinal infections: Secondary | ICD-10-CM

## 2023-10-08 NOTE — Telephone Encounter (Signed)
-----   Message from Nurse Mindoro B sent at 08/27/2023  4:46 PM EDT ----- Regarding: Stool test due H. Pylori stool antigen - need to order - [place]

## 2023-10-08 NOTE — Telephone Encounter (Signed)
 I called to confirm that they received the medication refill from [facility].  [name] also scheduled a FOV for his mom, [name].

## 2023-10-08 NOTE — Telephone Encounter (Signed)
 Duplicate CMN received on 10/08/23

## 2023-10-08 NOTE — Telephone Encounter (Signed)
 H. Pylori stool test order in epic. MyChart message sent to patient with reminder.

## 2023-10-09 ENCOUNTER — Encounter: Payer: Self-pay | Admitting: Pulmonary Disease

## 2023-10-09 ENCOUNTER — Ambulatory Visit: Admitting: Pulmonary Disease

## 2023-10-09 VITALS — BP 118/74 | HR 82 | Ht 60.0 in | Wt 216.2 lb

## 2023-10-09 DIAGNOSIS — J9611 Chronic respiratory failure with hypoxia: Secondary | ICD-10-CM | POA: Diagnosis not present

## 2023-10-09 DIAGNOSIS — J4540 Moderate persistent asthma, uncomplicated: Secondary | ICD-10-CM

## 2023-10-09 DIAGNOSIS — G4733 Obstructive sleep apnea (adult) (pediatric): Secondary | ICD-10-CM | POA: Diagnosis not present

## 2023-10-09 NOTE — Progress Notes (Signed)
 [name]    [id number]    1940/05/30  Primary Care Physician:Fry, Lenis Quin, MD  Referring Physician: Donley Furth, MD [address] [place],  [place] [address]  Chief complaint:   Patient in for follow-up chronic respiratory failure,  HPI:  Patient with a history of chronic hypoxic and hypercapnic respiratory failure, on nocturnal ventilator Tries to use the ventilator nightly  Was hospitalized in 2023 with hypercapnic respiratory failure discharged home on a trilogy ventilator at the time  She does have a history of asthma, chronic back pain, history of congestive heart failure, coronary artery disease, diabetes, pulmonary hypertension  She is doing well She was last seen in the office about 15 months ago Documentation from last visit to the office shows that she was using the BiPAP regularly  Compliant with Symbicort, uses albuterol  as needed   Outpatient Encounter Medications as of 10/09/2023  Medication Sig   acetaminophen  (TYLENOL ) 500 MG tablet Take 500 mg by mouth every 6 (six) hours as needed for headache (pain).    albuterol  (PROVENTIL ) (2.5 MG/3ML) 0.083% nebulizer solution Take 3 mLs (2.5 mg total) by nebulization every 4 (four) hours as needed for wheezing or shortness of breath.   albuterol  (VENTOLIN  HFA) 108 (90 BASE) MCG/ACT inhaler Inhale 2 puffs into the lungs every 4 (four) hours as needed for wheezing or shortness of breath.   ALPRAZolam  (XANAX ) 0.5 MG tablet TAKE 1 TABLET BY MOUTH 3 TIMES DAILY AS NEEDED.   amLODipine  (NORVASC ) 10 MG tablet TAKE 1 TABLET BY MOUTH EVERY DAY   atorvastatin  (LIPITOR) 20 MG tablet Take 1 tablet (20 mg total) by mouth daily.   azelastine (ASTELIN) 0.1 % nasal spray Place 1 spray into both nostrils at bedtime.   Blood Glucose Monitoring Suppl (ONETOUCH VERIO FLEX SYSTEM) w/Device KIT 1 each by Does not apply route daily at 12 noon.   budesonide  (ENTOCORT EC ) 3 MG 24 hr capsule Take 3 capsules (9 mg total)  by mouth daily. Please take for one more month then discontinue.   budesonide -formoterol  (SYMBICORT) 160-4.5 MCG/ACT inhaler Inhale 2 puffs into the lungs 2 (two) times daily.   Cholecalciferol  (VITAMIN D -3) 5000 UNITS TABS Patient takes 1 tablet by mouth once a week on Mondays.   ciclopirox  (PENLAC ) 8 % solution Apply topically at bedtime. Apply over nail. Apply daily over previous coat. Remove weekly with polish remover.   clopidogrel  (PLAVIX ) 75 MG tablet TAKE 1 TABLET BY MOUTH DAILY. PLEASE CALL [phone] TO SCHEDULE AN OVERDUE APPOINTMENT WITH DR. MARK SKAINS FOR FUTURE REFILLS. THANK YOU. 2ND ATTEMPT.   clotrimazole -betamethasone  (LOTRISONE ) cream APPLY 1 APPLICATION TOPICALLY TWICE DAILY TO AFFECTED AREAS OF FEET AND ANKLES FOR 4 WEEKS   colchicine 0.6 MG tablet Take 0.6 mg by mouth 2 (two) times daily.   dicyclomine  (BENTYL ) 10 MG capsule Take 1 capsule (10 mg total) by mouth 3 (three) times daily as needed for spasms.   diphenoxylate -atropine  (LOMOTIL ) 2.5-0.025 MG tablet TAKE 1 TABLET BY MOUTH TWICE A DAY AS NEEDED FOR DIARRHEA AND LOOSE STOOL   FARXIGA  10 MG TABS tablet TAKE 1 TABLET BY MOUTH EVERY DAY   fluticasone  (FLONASE ) 50 MCG/ACT nasal spray PLACE 2 SPRAYS IN EACH NOSTRIL AT BEDTIME   gabapentin  (NEURONTIN ) 300 MG capsule Take 1 capsule (300 mg total) by mouth 2 (two) times daily.   glimepiride  (AMARYL ) 1 MG tablet TAKE 2 TABLETS (2 MG TOTAL) BY MOUTH DAILY WITH BREAKFAST   glucose  blood (ONETOUCH VERIO) test strip Use to check blood glucose once daily   HYDROcodone  bit-homatropine (HYCODAN) 5-1.5 MG/5ML syrup Take 5 mLs by mouth every 4 (four) hours as needed.   HYDROmorphone  (DILAUDID ) 4 MG tablet Take 1 tablet (4 mg total) by mouth every 6 (six) hours as needed for severe pain (pain score 7-10).   HYDROmorphone  (DILAUDID ) 4 MG tablet Take 1 tablet (4 mg total) by mouth every 6 (six) hours as needed for severe pain (pain score 7-10).   HYDROmorphone  (DILAUDID ) 4 MG tablet Take  1 tablet (4 mg total) by mouth every 6 (six) hours as needed for severe pain (pain score 7-10).   hydrOXYzine  (ATARAX ) 10 MG tablet Take 10 mg by mouth every 6 (six) hours as needed.   JANUVIA  100 MG tablet TAKE 1 TABLET BY MOUTH EVERY DAY   ketoconazole  (NIZORAL ) 2 % cream Apply 1 Application topically daily. Apply 1gm to bilateral foot  once daily   levocetirizine (XYZAL ) 5 MG tablet Take 5 mg by mouth every evening.   levofloxacin  (LEVAQUIN ) 500 MG tablet Take 1 tablet (500 mg total) by mouth daily.   LIDODERM  5 % APPLY 1 PATCH TO SKIN FOR 12 HOURS THEN REMOVE AND DISCARD PATCH WITHIN 72 HOURS OR AS DIRECTED   loperamide (IMODIUM) 2 MG capsule Take 4-6 mg by mouth daily. Taking as needed only   losartan  (COZAAR ) 25 MG tablet Take 1 tablet (25 mg total) by mouth daily.   mesalamine  (LIALDA ) 1.2 g EC tablet Take 2 tablets (2.4 g total) by mouth 2 (two) times daily.   metoprolol  succinate (TOPROL -XL) 100 MG 24 hr tablet TAKE 1 TABLET BY MOUTH DAILY. TAKE WITH OR IMMEDIATELY FOLLOWING A MEAL.   montelukast  (SINGULAIR ) 10 MG tablet Take one every morning   nitroGLYCERIN  (NITROSTAT ) 0.4 MG SL tablet PLACE 1 TABLET UNDER THE TONGUE EVERY 5 MINUTES AS NEEDED FOR CHEST PAIN.   Olopatadine  HCl 0.7 % SOLN Place 1 drop into both eyes daily as needed (itching/allergies).   OneTouch Delica Lancets 33G MISC 1 each by Does not apply route daily at 12 noon.   Pancrelipase , Lip-Prot-Amyl, (ZENPEP ) 20000-63000 units CPEP TAKE 2 CAPSULES (40,000 UNITS) WITH MEALS AND 1 CAPSULE (20,000 UNITS) WITH A SNACK. Max daily dose of 8 capsules (160,000 units).   Pancrelipase , Lip-Prot-Amyl, (ZENPEP ) 40000-126000 units CPEP Take by mouth. Take 2 tablets by mouth before meals and 1 tablet before snacks   pantoprazole  (PROTONIX ) 40 MG tablet Take 1 tablet (40 mg total) by mouth 2 (two) times daily before a meal.   Polyethyl Glycol-Propyl Glycol (SYSTANE OP) Place 1 drop into both eyes daily. For dry eyes   potassium chloride   SA (KLOR-CON  M) 20 MEQ tablet TAKE 1 TABLET BY MOUTH TWICE A DAY   tiZANidine  (ZANAFLEX ) 4 MG tablet Take 4 mg by mouth 2 (two) times daily.   torsemide  (DEMADEX ) 20 MG tablet TAKE 2 TABLETS BY MOUTH EVERY DAY   Vitamin D , Ergocalciferol , (DRISDOL) 1.25 MG (50000 UNIT) CAPS capsule Take 50,000 Units by mouth every 14 (fourteen) days.   magnesium  oxide (MAG-OX) 400 MG tablet TAKE 3 TABLETS (1,200 MG TOTAL) BY MOUTH DAILY. (Patient not taking: Reported on 10/09/2023)   Facility-Administered Encounter Medications as of 10/09/2023  Medication   ipratropium-albuterol  (DUONEB) 0.5-2.5 (3) MG/3ML nebulizer solution 3 mL    Allergies as of 10/09/2023 - Review Complete 10/09/2023  Allergen Reaction Noted   Doxycycline Other (See Comments) 03/12/2012   Aspirin  Nausea And Vomiting 01/25/2009  Azithromycin Other (See Comments)    Caffeine Nausea And Vomiting 06/24/2014   Ciprofloxacin Nausea And Vomiting    Codeine Nausea And Vomiting    Oxycodone  Other (See Comments) 11/19/2017   Sulfamethizole Other (See Comments) 03/12/2012   Tramadol hcl Other (See Comments)     Past Medical History:  Diagnosis Date   Adenomatous colon polyp 02/2006   Allergy    Allergy, unspecified not elsewhere classified    Anemia    Anxiety    Arthritis    Asthma    Blood transfusion without reported diagnosis    Bronchiectasis    CAD (coronary artery disease)    BMS to the LAD, 2002; cath 12/07/11 patent LAD stent and mild nonobstructive disease, EF 65%; Medical management   Cataract    removed both eyes   CHF (congestive heart failure) (HCC)    Diastolic   Chronic diastolic CHF (congestive heart failure) (HCC) 11/25/2011   Clotting disorder (HCC)    in legs per pt    Diabetes mellitus    Diverticulosis of colon    DJD (degenerative joint disease)    Fibromyalgia    GERD (gastroesophageal reflux disease)    H. pylori infection 05/12/2019   Hypercholesterolemia    Hypertension    Microscopic hematuria     Neoplasm of kidney    Obesity    Other diseases of lung, not elsewhere classified    Pinched vertebral nerve    Pulmonary nodule    Negative PET in 2010   Stress incontinence, female    Syncope    Ulcerative colitis, left sided (HCC) 2008   Hx of   UTI (lower urinary tract infection)    Vitamin D  deficiency disease     Past Surgical History:  Procedure Laterality Date   ABDOMINAL HYSTERECTOMY     CARDIAC CATHETERIZATION  11/2011   CATARACT EXTRACTION, BILATERAL  2020   CHOLECYSTECTOMY     COLONOSCOPY  01/09/2018   per Dr. Sandrea Cruel, adenomatous polyps, repeat in 3 yrs   CORONARY STENT PLACEMENT  2002   BMS to the LAD   ESOPHAGOGASTRODUODENOSCOPY  12/2008   HAND SURGERY  01/2006   Right Hand Surgery by Dr. Aloha Arnold   LEFT HEART CATHETERIZATION WITH CORONARY ANGIOGRAM N/A 12/07/2011   Procedure: LEFT HEART CATHETERIZATION WITH CORONARY ANGIOGRAM;  Surgeon: Peter M Swaziland, MD;  Location: [facility] CATH LAB;  Service: Cardiovascular;  Laterality: N/A;   RIGHT/LEFT HEART CATH AND CORONARY ANGIOGRAPHY N/A 08/07/2021   Procedure: RIGHT/LEFT HEART CATH AND CORONARY ANGIOGRAPHY;  Surgeon: Mardell Shade, MD;  Location: [facility] INVASIVE CV LAB;  Service: Cardiovascular;  Laterality: N/A;   UPPER GASTROINTESTINAL ENDOSCOPY      Family History  Problem Relation Age of Onset   Pneumonia Father    Heart attack Father    Colon polyps Father    Parkinsonism Mother    Diabetes Brother    Sarcoidosis Brother    Hypertension Sister    Diabetes Sister    Breast cancer Daughter    Colon cancer Neg Hx    Esophageal cancer Neg Hx    Rectal cancer Neg Hx    Stomach cancer Neg Hx     Social History   Socioeconomic History   Marital status: Divorced    Spouse name: Not on file   Number of children: 3   Years of education: Not on file   Highest education level: Not on file  Occupational History   Occupation: Retired    Associate Professor:  RETIRED  Tobacco Use   Smoking status: Former    Current  packs/day: 0.00    Average packs/day: 2.0 packs/day for 15.0 years (30.0 ttl pk-yrs)    Types: Cigarettes    Start date: 04/30/1958    Quit date: 04/30/1973    Years since quitting: 50.4    Passive exposure: Past   Smokeless tobacco: Never   Tobacco comments:    ex-smoker: smoked for 10-15 years up to 2ppd, quit 1975.  Vaping Use   Vaping status: Never Used  Substance and Sexual Activity   Alcohol  use: Not Currently    Alcohol /week: 0.0 standard drinks of alcohol     Comment: none now    Drug use: No   Sexual activity: Not Currently  Other Topics Concern   Not on file  Social History Narrative   Right handed   Social Drivers of Health   Financial Resource Strain: Low Risk  (05/31/2023)   Overall Financial Resource Strain (CARDIA)    Difficulty of Paying Living Expenses: Not hard at all  Food Insecurity: No Food Insecurity (05/31/2023)   Hunger Vital Sign    Worried About Running Out of Food in the Last Year: Never true    Ran Out of Food in the Last Year: Never true  Transportation Needs: No Transportation Needs (05/31/2023)   PRAPARE - Administrator, Civil Service (Medical): No    Lack of Transportation (Non-Medical): No  Physical Activity: Inactive (05/31/2023)   Exercise Vital Sign    Days of Exercise per Week: 0 days    Minutes of Exercise per Session: 0 min  Stress: No Stress Concern Present (05/31/2023)   [employer] of Occupational Health - Occupational Stress Questionnaire    Feeling of Stress : Not at all  Social Connections: Socially Integrated (05/31/2023)   Social Connection and Isolation Panel [NHANES]    Frequency of Communication with Friends and Family: More than three times a week    Frequency of Social Gatherings with Friends and Family: More than three times a week    Attends Religious Services: More than 4 times per year    Active Member of [employer] or Organizations: Yes    Attends Engineer, structural: More than 4 times per year     Marital Status: Married  Catering manager Violence: Not At Risk (05/31/2023)   Humiliation, Afraid, Rape, and Kick questionnaire    Fear of Current or Ex-Partner: No    Emotionally Abused: No    Physically Abused: No    Sexually Abused: No    Review of Systems  Respiratory:  Positive for apnea and shortness of breath.   Psychiatric/Behavioral:  Positive for sleep disturbance.     Vitals:   10/09/23 1029  BP: 118/74  Pulse: 82  SpO2: 91%     Physical Exam Constitutional:      Appearance: She is obese.  HENT:     Head: Normocephalic.     Mouth/Throat:     Mouth: Mucous membranes are moist.   Eyes:     General: No scleral icterus.   Cardiovascular:     Rate and Rhythm: Normal rate and regular rhythm.     Heart sounds: No murmur heard.    No friction rub.  Pulmonary:     Effort: No respiratory distress.     Breath sounds: No stridor. No wheezing or rhonchi.   Musculoskeletal:     Cervical back: No rigidity or tenderness.   Neurological:  Mental Status: She is alert.   Psychiatric:        Mood and Affect: Mood normal.    Data Reviewed: Ventilator compliance is poor with about 33% compliance Patient states she uses tries to use it every night  Spirometry in April 2023 suggest restriction  Assessment:  Chronic respiratory failure - On nocturnal ventilator, oxygen  at 2 L into device - Continue oxygen  supplementation during the day  Obstructive sleep apnea - Continue ventilator at night  History of cor pulmonale History of HFrEF  Shortness of breath with activity - This is likely multifactorial  Background history of asthma - Continue inhalers - On Symbicort, albuterol  as needed  Deconditioning  Plan/Recommendations: Encouraged to use nocturnal ventilator  Continue inhalers  Graded activities as tolerated  Follow-up in about a year    Myer Artis MD [facility] 10/09/2023, 10:41 AM  CC: Donley Furth,  MD

## 2023-10-09 NOTE — Progress Notes (Signed)
 [facility] Return Visit   Referring Provider Donley Furth, MD [address] [place],  [place] [address]  Primary Care Provider Donley Furth, MD  Patient Profile: [name] is a 83 y.o. female with a past medical history noteworthy for CAD, CHF, T2 DM, fibromyalgia, HTN, HLD, OSA, who returns to the [facility] for follow-up of the problem(s) noted below.  Problem List: Left-sided ulcerative colitis diagnosed 2008 Pancreatic insufficiency dx'd 2020 IBS-D GERD H. pylori gastritis 03/2019  Personal history of tubular adenomas and sessile serrated polyps History of mild normocytic anemia Status post cholecystectomy   History of Present Illness   [name] was last seen in the GI office 07/30/2023   Current GI Meds  Lialda  2.4 g p.o. twice daily Zenpep  40,000 units 2 caps before meals, 1 cap before snacks Pantoprazole  40 mg p.o. twice daily Dicyclomine  10 mg p.o. 3 times daily Imodium 2-3 times daily as needed Lomotil  twice daily as needed  Interval History  [name] returns to the office today accompanied by her son [name] She and her children's main concern is regarding symptoms of fecal urgency and chronic diarrhea  As a summary of last visit we dicussed that [name] carries multiple GI conditions that could be contributing to loose stool and diarrhea as outlined below:  Left-sided UC -- Diagnosed with left-sided UC in 2008 -- On maintenance Lialda  2.4 g p.o. twice daily -- Colonoscopy 2019 normal confirming endoscopic remission of disease -- 08/2022-fecal calprotectin elevated 943 and advised to use budesonide  x 3 months -- Son reports that she has been using budesonide  on and off for management of symptoms of diarrhea -medication is quite costly and son queries if she needs it -- Fecal calprotectin 07/2023  291 -- Budesonide  discontinued after last visit  Exocrine pancreatic insufficiency -- Diagnosed with EPI 12/2018  with fecal elastase 170 -- Maintained on Zenpep  40,000 units 2 caps before meals and 1 cap before snacks -- Family indicated they were not aware of the diagnosis but also has not had updated testing in the last 5 years -recommended updated testing -- No known history of pancreatitis or other pancreatic disorders -- Fecal fat and pancreatic elastase 07/2023 normal  IBS-D -- On dicyclomine  10 mg p.o. 3 times daily -- Utilizes Imodium as needed -advised by her cardiologist to be cautious regarding dosing -- Utilizes Lomotil  as needed  Other potential contributing factors to diarrhea -- Notes sensitivity to dairy and states she is using Lactaid products such as Lactaid milk in her cereal -- Has a history of cholecystectomy and could be experiencing bile acid malabsorption -- Medications she is taking that could cause diarrhea - colchicine, magnesium  oxide  --> magnesium  oxide discontinued 07/2023 -- Thyroid  and celiac testing within the last year were normal -- Reports both urinary and fecal incontinence at times -discussed potential issues with pelvic floor  H. pylori gastritis 2020 -- Diagnosed endoscopically on EGD biopsies -- Did not tolerate quadruple therapy -- Seen by ID and treated with amoxicillin  1 g p.o. twice daily, Levaquin  500 mg daily and PPI 40 mg p.o. twice daily -- H. pylori stool antigen 07/2023 positive --> treated with Talicia  completed 2 weeks ago  GERD -- Currently stable on pantoprazole  40 mg orally daily   Today's visit -- As above, changes made include ceasing magnesium , discontinuing budesonide  -- Diarrhea has not resolved but she thinks that it is a little better compared to last visit -- Still needs to take Imodium before  she goes out to eat but not when she has at home --she is aware to use Imodium sparingly due to history of prolonged QT -- Stools are described as a mix of formed and loose stool -- KUB 06/2023 read as increased stool mass consistent with  constipation-family continues to maintain that they do not think she is constipated --she is on narcotic pain medicines -- Reviewed options of trialing Colestid /cholestyramine given history of cholecystectomy and she is open to this  -- Also reports intermittent episodes of emesis that are nonbloody and nonbilious in nature-she did not mention these at last visit -- Episodes occur out of the blue-she had 1 on Sunday morning before church and again on Wednesday -- Endorses nausea but no associated abdominal pain -- Cannot identify any specific triggers -- Query if it could be medication related -- She inquires about a prescription for ondansetron , however, chart review indicates that she was advised to limit the medication in the past due to prolonged QT -discussed trial of low-dose Phenergan  prn -- Due to submit her H. pylori stool antigen in 2 weeks   Last colonoscopy: 12/2017 -no active inflammation, 3 polyps in DC, AC, ICV (TA' s), 2 medium size lipomas in TC and AC, left-sided diverticula Last endoscopy:  03/2019 -normal esophagus, diffuse moderate inflammation in stomach, 2 gastric papules, normal duodenum -H. pylori gastritis on pathology  Last Abd CT/CTE/MRE: MRI 05/2022 -bilateral renal lesions-probable complex cysts-follow-up recommended 6 to 12 months, possible constipation  GI Review of Symptoms Significant for diarrhea. Otherwise negative.  General Review of Systems  Review of systems is significant for the pertinent positives and negatives as listed per the HPI.  Full ROS is otherwise negative.  Past Medical History   Past Medical History:  Diagnosis Date   Adenomatous colon polyp 02/2006   Allergy    Allergy, unspecified not elsewhere classified    Anemia    Anxiety    Arthritis    Asthma    Blood transfusion without reported diagnosis    Bronchiectasis    CAD (coronary artery disease)    BMS to the LAD, 2002; cath 12/07/11 patent LAD stent and mild nonobstructive  disease, EF 65%; Medical management   Cataract    removed both eyes   CHF (congestive heart failure) (HCC)    Diastolic   Chronic diastolic CHF (congestive heart failure) (HCC) 11/25/2011   Clotting disorder (HCC)    in legs per pt    Diabetes mellitus    Diverticulosis of colon    DJD (degenerative joint disease)    Fibromyalgia    GERD (gastroesophageal reflux disease)    H. pylori infection 05/12/2019   Hypercholesterolemia    Hypertension    Microscopic hematuria    Neoplasm of kidney    Obesity    Other diseases of lung, not elsewhere classified    Pinched vertebral nerve    Pulmonary nodule    Negative PET in 2010   Stress incontinence, female    Syncope    Ulcerative colitis, left sided (HCC) 2008   Hx of   UTI (lower urinary tract infection)    Vitamin D  deficiency disease      Past Surgical History   Past Surgical History:  Procedure Laterality Date   ABDOMINAL HYSTERECTOMY     CARDIAC CATHETERIZATION  11/2011   CATARACT EXTRACTION, BILATERAL  2020   CHOLECYSTECTOMY     COLONOSCOPY  01/09/2018   per Dr. Sandrea Cruel, adenomatous polyps, repeat in 3 yrs  CORONARY STENT PLACEMENT  2002   BMS to the LAD   ESOPHAGOGASTRODUODENOSCOPY  12/2008   HAND SURGERY  01/2006   Right Hand Surgery by Dr. Aloha Arnold   LEFT HEART CATHETERIZATION WITH CORONARY ANGIOGRAM N/A 12/07/2011   Procedure: LEFT HEART CATHETERIZATION WITH CORONARY ANGIOGRAM;  Surgeon: Peter M Swaziland, MD;  Location: [facility] C[facility];  Service: Cardiovascular;  Laterality: N/A;   RIGHT/LEFT HEART CATH AND CORONARY ANGIOGRAPHY N/A 08/07/2021   Procedure: RIGHT/LEFT HEART CATH AND CORONARY ANGIOGRAPHY;  Surgeon: Mardell Shade, MD;  Location: [facility] INVASIVE CV LAB;  Service: Cardiovascular;  Laterality: N/A;   UPPER GASTROINTESTINAL ENDOSCOPY       Allergies and Medications   Allergies  Allergen Reactions   Doxycycline Other (See Comments)    Unsteady gait   Aspirin  Nausea And Vomiting   Azithromycin Other  (See Comments)    REACTION: pt states ZPak doesn't work   Caffeine Nausea And Vomiting   Ciprofloxacin Nausea And Vomiting    Tolerates levaquin    Codeine Nausea And Vomiting   Oxycodone  Other (See Comments)    Pt stated, upsets my stomach (11/19/17)   Sulfamethizole Other (See Comments)    Unknown reaction   Tramadol Hcl Other (See Comments)    REACTION: pt states spaced-out    Current Meds  Medication Sig   acetaminophen  (TYLENOL ) 500 MG tablet Take 500 mg by mouth every 6 (six) hours as needed for headache (pain).    albuterol  (PROVENTIL ) (2.5 MG/3ML) 0.083% nebulizer solution Take 3 mLs (2.5 mg total) by nebulization every 4 (four) hours as needed for wheezing or shortness of breath.   albuterol  (VENTOLIN  HFA) 108 (90 BASE) MCG/ACT inhaler Inhale 2 puffs into the lungs every 4 (four) hours as needed for wheezing or shortness of breath.   ALPRAZolam  (XANAX ) 0.5 MG tablet TAKE 1 TABLET BY MOUTH 3 TIMES DAILY AS NEEDED.   amLODipine  (NORVASC ) 10 MG tablet TAKE 1 TABLET BY MOUTH EVERY DAY   atorvastatin  (LIPITOR) 20 MG tablet Take 1 tablet (20 mg total) by mouth daily.   azelastine (ASTELIN) 0.1 % nasal spray Place 1 spray into both nostrils at bedtime.   Blood Glucose Monitoring Suppl (ONETOUCH VERIO FLEX SYSTEM) w/Device KIT 1 each by Does not apply route daily at 12 noon.   budesonide  (ENTOCORT EC ) 3 MG 24 hr capsule Take 3 capsules (9 mg total) by mouth daily. Please take for one more month then discontinue.   budesonide -formoterol  (SYMBICORT) 160-4.5 MCG/ACT inhaler Inhale 2 puffs into the lungs 2 (two) times daily.   Cholecalciferol  (VITAMIN D -3) 5000 UNITS TABS Patient takes 1 tablet by mouth once a week on Mondays.   ciclopirox  (PENLAC ) 8 % solution Apply topically at bedtime. Apply over nail. Apply daily over previous coat. Remove weekly with polish remover.   clopidogrel  (PLAVIX ) 75 MG tablet TAKE 1 TABLET BY MOUTH DAILY. PLEASE CALL [phone] TO SCHEDULE AN OVERDUE  APPOINTMENT WITH DR. MARK SKAINS FOR FUTURE REFILLS. THANK YOU. 2ND ATTEMPT.   clotrimazole -betamethasone  (LOTRISONE ) cream APPLY 1 APPLICATION TOPICALLY TWICE DAILY TO AFFECTED AREAS OF FEET AND ANKLES FOR 4 WEEKS   colchicine 0.6 MG tablet Take 0.6 mg by mouth 2 (two) times daily.   colestipol  (COLESTID ) 1 g tablet Take 2 tablets (2 g total) by mouth 2 (two) times daily.   dicyclomine  (BENTYL ) 10 MG capsule Take 1 capsule (10 mg total) by mouth 3 (three) times daily as needed for spasms.   diphenoxylate -atropine  (LOMOTIL ) 2.5-0.025 MG tablet TAKE 1 TABLET  BY MOUTH TWICE A DAY AS NEEDED FOR DIARRHEA AND LOOSE STOOL   FARXIGA  10 MG TABS tablet TAKE 1 TABLET BY MOUTH EVERY DAY   fluticasone  (FLONASE ) 50 MCG/ACT nasal spray PLACE 2 SPRAYS IN EACH NOSTRIL AT BEDTIME   gabapentin  (NEURONTIN ) 300 MG capsule Take 1 capsule (300 mg total) by mouth 2 (two) times daily.   glimepiride  (AMARYL ) 1 MG tablet TAKE 2 TABLETS (2 MG TOTAL) BY MOUTH DAILY WITH BREAKFAST   glucose blood (ONETOUCH VERIO) test strip Use to check blood glucose once daily   HYDROcodone  bit-homatropine (HYCODAN) 5-1.5 MG/5ML syrup Take 5 mLs by mouth every 4 (four) hours as needed.   HYDROmorphone  (DILAUDID ) 4 MG tablet Take 1 tablet (4 mg total) by mouth every 6 (six) hours as needed for severe pain (pain score 7-10).   hydrOXYzine  (ATARAX ) 10 MG tablet Take 10 mg by mouth every 6 (six) hours as needed.   JANUVIA  100 MG tablet TAKE 1 TABLET BY MOUTH EVERY DAY   ketoconazole  (NIZORAL ) 2 % cream Apply 1 Application topically daily. Apply 1gm to bilateral foot  once daily   levocetirizine (XYZAL ) 5 MG tablet Take 5 mg by mouth every evening.   levofloxacin  (LEVAQUIN ) 500 MG tablet Take 1 tablet (500 mg total) by mouth daily.   LIDODERM  5 % APPLY 1 PATCH TO SKIN FOR 12 HOURS THEN REMOVE AND DISCARD PATCH WITHIN 72 HOURS OR AS DIRECTED   loperamide (IMODIUM) 2 MG capsule Take 4-6 mg by mouth daily. Taking as needed only   losartan  (COZAAR )  25 MG tablet Take 1 tablet (25 mg total) by mouth daily.   magnesium  oxide (MAG-OX) 400 MG tablet TAKE 3 TABLETS (1,200 MG TOTAL) BY MOUTH DAILY.   mesalamine  (LIALDA ) 1.2 g EC tablet Take 2 tablets (2.4 g total) by mouth 2 (two) times daily.   metoprolol  succinate (TOPROL -XL) 100 MG 24 hr tablet TAKE 1 TABLET BY MOUTH DAILY. TAKE WITH OR IMMEDIATELY FOLLOWING A MEAL.   montelukast  (SINGULAIR ) 10 MG tablet Take one every morning   nitroGLYCERIN  (NITROSTAT ) 0.4 MG SL tablet PLACE 1 TABLET UNDER THE TONGUE EVERY 5 MINUTES AS NEEDED FOR CHEST PAIN.   Olopatadine  HCl 0.7 % SOLN Place 1 drop into both eyes daily as needed (itching/allergies).   OneTouch Delica Lancets 33G MISC 1 each by Does not apply route daily at 12 noon.   Pancrelipase , Lip-Prot-Amyl, (ZENPEP ) 20000-63000 units CPEP TAKE 2 CAPSULES (40,000 UNITS) WITH MEALS AND 1 CAPSULE (20,000 UNITS) WITH A SNACK. Max daily dose of 8 capsules (160,000 units).   Pancrelipase , Lip-Prot-Amyl, (ZENPEP ) 40000-126000 units CPEP Take by mouth. Take 2 tablets by mouth before meals and 1 tablet before snacks   pantoprazole  (PROTONIX ) 40 MG tablet Take 1 tablet (40 mg total) by mouth 2 (two) times daily before a meal.   Polyethyl Glycol-Propyl Glycol (SYSTANE OP) Place 1 drop into both eyes daily. For dry eyes   potassium chloride  SA (KLOR-CON  M) 20 MEQ tablet TAKE 1 TABLET BY MOUTH TWICE A DAY   promethazine  (PHENERGAN ) 25 MG tablet Take 0.5 tablets (12.5 mg total) by mouth daily. As needed for nausea/ vomiting.   tiZANidine  (ZANAFLEX ) 4 MG tablet Take 4 mg by mouth 2 (two) times daily.   torsemide  (DEMADEX ) 20 MG tablet TAKE 2 TABLETS BY MOUTH EVERY DAY   Vitamin D , Ergocalciferol , (DRISDOL) 1.25 MG (50000 UNIT) CAPS capsule Take 50,000 Units by mouth every 14 (fourteen) days.   Current Facility-Administered Medications for the 10/10/23 encounter (Office  Visit) with Truddie Furrow, MD  Medication   ipratropium-albuterol  (DUONEB) 0.5-2.5 (3) MG/3ML  nebulizer solution 3 mL     Family History   Family History  Problem Relation Age of Onset   Pneumonia Father    Heart attack Father    Colon polyps Father    Parkinsonism Mother    Diabetes Brother    Sarcoidosis Brother    Hypertension Sister    Diabetes Sister    Breast cancer Daughter    Colon cancer Neg Hx    Esophageal cancer Neg Hx    Rectal cancer Neg Hx    Stomach cancer Neg Hx      Social History   Social History   Tobacco Use   Smoking status: Former    Current packs/day: 0.00    Average packs/day: 2.0 packs/day for 15.0 years (30.0 ttl pk-yrs)    Types: Cigarettes    Start date: 04/30/1958    Quit date: 04/30/1973    Years since quitting: 50.4    Passive exposure: Past   Smokeless tobacco: Never   Tobacco comments:    ex-smoker: smoked for 10-15 years up to 2ppd, quit 1975.  Vaping Use   Vaping status: Never Used  Substance Use Topics   Alcohol  use: Not Currently    Alcohol /week: 0.0 standard drinks of alcohol     Comment: none now    Drug use: No   Lashena reports that she quit smoking about 50 years ago. Her smoking use included cigarettes. She started smoking about 65 years ago. She has a 30 pack-year smoking history. She has been exposed to tobacco smoke. She has never used smokeless tobacco. She reports that she does not currently use alcohol . She reports that she does not use drugs.  Vital Signs and Physical Examination   Vitals:   10/10/23 1527  BP: 118/72  Pulse: 75    Body mass index is 38.28 kg/m. Weight: 196 lb (88.9 kg)  General: Elderly, sitting in wheelchair, memory recall seems somewhat impaired Head: Normocephalic and atraumatic Eyes: Sclerae anicteric, EOMI Lungs: Clear throughout to auscultation Heart: Regular rate and rhythm; No murmurs, rubs or bruits Abdomen: Soft, non tender and non distended. No masses, hepatosplenomegaly or hernias noted. Normal Bowel sounds Rectal: Deferred   Review of Data  The following data was  reviewed at the time of this encounter:  Laboratory Studies      Latest Ref Rng & Units 07/30/2023   10:51 AM 09/04/2022   10:30 AM 08/16/2021    2:00 PM  CBC  WBC 4.0 - 10.5 K/uL 15.3  11.2  13.6   Hemoglobin 12.0 - 15.0 g/dL 21.3  08.6  57.8   Hematocrit 36.0 - 46.0 % 39.0  35.4  42.9   Platelets 150.0 - 400.0 K/uL 455.0  532.0  577     Lab Results  Component Value Date   LIPASE 25 12/07/2011      Latest Ref Rng & Units 07/30/2023   10:51 AM 09/04/2022   10:30 AM 11/22/2021   11:30 AM  CMP  Glucose 70 - 99 mg/dL 469  629  528   BUN 6 - 23 mg/dL 24  15  17    Creatinine 0.40 - 1.20 mg/dL 4.13  2.44  0.10   Sodium 135 - 145 mEq/L 133  136  137   Potassium 3.5 - 5.1 mEq/L 4.1  3.9  4.0   Chloride 96 - 112 mEq/L 99  99  102   CO2  19 - 32 mEq/L 25  28  27    Calcium  8.4 - 10.5 mg/dL 8.8  9.3  9.4   Total Protein 6.0 - 8.3 g/dL 7.0  7.4    Total Bilirubin 0.2 - 1.2 mg/dL 0.5  0.4    Alkaline Phos 39 - 117 U/L 80  79    AST 0 - 37 U/L 10  10    ALT 0 - 35 U/L 10  5     Lab Results  Component Value Date   TSH 1.41 09/04/2022   Fecal calprotectin  08/2022  943 07/2023  291  Labs 07/2023 Fecal fat normal Fecal pancreatic elastase greater than 800  Imaging Studies  KUB 07/25/2023 Constipation. Diverticulosis.   MRI Abdomen 05/2022 1. Mild degradation secondary to patient body habitus. 2. Multiple bilateral renal lesions, including probable complex cysts as detailed above. Given above limitations, these are considered Bosniak 2 F and warrant follow-up with pre and post-contrast renal protocol CT or MRI at 6-12 months. On that exam, recommend special attention to a hypoenhancing 8 mm lesion for which a tiny neoplasm is a concern. 3.  Possible constipation. 4.  Aortic Atherosclerosis (ICD10-I70.0).  CTAP 07/2021 1. No pulmonary embolism. 2. Cardiomegaly with extensive coronary artery calcifications. 3. Marked dilatation of the main pulmonary trunk suggesting pulmonary  hypertension. 4. Small bilateral pleural effusions and bilateral infiltrates. Aspiration would certainly be a consideration. 5. 3 cm low-attenuation lesion involving the left kidney. This could be due to hemorrhage or enhancement. Pre and postcontrast abdominal CT scan suggested in 3 months to reassess. 6. Stable bilateral renal calculi. 7. No acute abdominal or pelvic findings.  GI Procedures and Studies  EGD 03/2019 Normal esophagus, diffuse moderate inflammation in stomach, 2 gastric papules, normal duodenum -H. pylori gastritis on pathology  Colonoscopy 12/2017 No active inflammation, 3 polyps in DC, AC, ICV (TA' s), 2 medium size lipomas in TC and AC, left-sided diverticula  Colonoscopy 10/2012 Mild diverticulosis of sigmoid colon, IH  EGD 10/2012 Gastric polyps, otherwise normal  Clinical Impression  It is my clinical impression that [name] is a 83 y.o. female with;  Left-sided ulcerative colitis diagnosed 2008 Pancreatic insufficiency dx'd 2020 IBS-D GERD H. pylori gastritis 03/2019  Personal history of tubular adenomas and sessile serrated polyps History of mild normocytic anemia Status post cholecystectomy  [name] returns to the office today with a primary complaint of chronic diarrhea and fecal urgency. At her last visit,  I explained to her family that she carries multiple diagnoses that could be contributing to diarrhea: Left-sided UC, pancreatic exocrine insufficiency, IBS-D.  She is also taking medications that could cause diarrhea in the form of colchicine and magnesium  oxide.  Also noteworthy that she has undergone cholecystectomy and could be experiencing a component of bile acid diarrhea.  After her last visit, fecal calprotectin was obtained and was slightly elevated.  It was unclear if budesonide  was making a difference and was quite costly.  Through shared decision making with her son, we agreed to stop budesonide  and monitor her symptoms.  Her diarrhea has not  dramatically worsened after ceasing budesonide .  Will obtain a follow-up fecal calprotectin in the future.  Testing for EPI was normal.  Magnesium  was discontinued after her last visit as well.  She thinks her symptoms may be slightly better but have not resolved.  Remains unclear if she is truly experiencing diarrhea versus constipation with overflow encopresis based upon last KUB.  She and her family maintain that she  is having diarrhea.  As such we discussed a trial of Colestid  for possible bile acid diarrhea status postcholecystectomy.  With her age and overall general health status, would aim to avoid colonoscopy but could be considered if symptoms are unremitting.  [name] was previously diagnosed with H. pylori in 2020.  She did not tolerate quadruple therapy.  She was seen by ID and treated with amoxicillin  1 g p.o. twice daily, Levaquin  500 mg daily and a PPI.  A test of cure was not obtained at that time.  She submitted an H. pylori stool antigen in April 2025 that was positive.  She has been treated with Talicia  and will submit a stool specimen in 2 weeks to evaluate for test of cure.  She has had recent episodes of vomiting -unclear if this is related to H. pylori, polypharmacy, delayed gastric emptying related to narcotic pain medication.  We will follow-up her H. pylori status.  Reviewed that we may need to consider an EGD in the future if symptoms are not improving.  Plan  Submit stool studies for H. pylori stool antigen and fecal calprotectin. Continue Lialda  2.4 g p.o. twice daily and continue holding budesonide  Continue dicyclomine  10 mg p.o. 3 times daily Continue Zenpep  40,000 units 2 caps before meals, 1 cap before snacks for now May continue Imodium and Lomotil  as needed -judicious use of Imodium given history of prolonged QT Continue Lactaid products Trial of Colestid  2 g p.o. twice daily Continue pantoprazole  40 mg orally daily  Planned Follow Up 3 months  The patient or  caregiver verbalized understanding of the material covered, with no barriers to understanding. All questions were answered. Patient or caregiver is agreeable with the plan outlined above.    It was a pleasure to see [name].  If you have any questions or concerns regarding this evaluation, do not hesitate to contact me.  Eugenia Hess, MD [facility]

## 2023-10-09 NOTE — Patient Instructions (Addendum)
 I will see you back in about a year  Call us  with significant concerns  Continue using your inhalers  Continue using your nighttime ventilator  Ensure you are using your ventilator every night  Graded activities as tolerated

## 2023-10-10 ENCOUNTER — Ambulatory Visit: Admitting: Pediatrics

## 2023-10-10 ENCOUNTER — Encounter: Payer: Self-pay | Admitting: Pediatrics

## 2023-10-10 VITALS — BP 118/72 | HR 75 | Ht 60.0 in | Wt 196.0 lb

## 2023-10-10 DIAGNOSIS — K9089 Other intestinal malabsorption: Secondary | ICD-10-CM

## 2023-10-10 DIAGNOSIS — K5180 Other ulcerative colitis without complications: Secondary | ICD-10-CM

## 2023-10-10 DIAGNOSIS — K219 Gastro-esophageal reflux disease without esophagitis: Secondary | ICD-10-CM | POA: Diagnosis not present

## 2023-10-10 DIAGNOSIS — A048 Other specified bacterial intestinal infections: Secondary | ICD-10-CM | POA: Diagnosis not present

## 2023-10-10 DIAGNOSIS — K51519 Left sided colitis with unspecified complications: Secondary | ICD-10-CM | POA: Diagnosis not present

## 2023-10-10 DIAGNOSIS — K580 Irritable bowel syndrome with diarrhea: Secondary | ICD-10-CM

## 2023-10-10 DIAGNOSIS — K8689 Other specified diseases of pancreas: Secondary | ICD-10-CM

## 2023-10-10 MED ORDER — COLESTIPOL HCL 1 G PO TABS: 2.0000 g | ORAL_TABLET | Freq: Two times a day (BID) | ORAL | 3 refills | Status: DC

## 2023-10-10 MED ORDER — PROMETHAZINE HCL 25 MG PO TABS: 12.5000 mg | ORAL_TABLET | ORAL | 1 refills | Status: DC

## 2023-10-10 NOTE — Patient Instructions (Signed)
 We have sent the following medications to your pharmacy for you to pick up at your convenience: Colested 1G tablets, take 2 tablets twice a day.  Phenergan  25 mg tablets, take 1/2 tablet once a day.  Your provider has requested that you go to the basement level for lab work before leaving today. Press B on the elevator. The lab is located at the first door on the left as you exit the elevator.   Due to recent changes in healthcare laws, you may see the results of your imaging and laboratory studies on MyChart before your provider has had a chance to review them.  We understand that in some cases there may be results that are confusing or concerning to you. Not all laboratory results come back in the same time frame and the provider may be waiting for multiple results in order to interpret others.  Please give us  48 hours in order for your provider to thoroughly review all the results before contacting the office for clarification of your results.    Follow up in 3 months.   Thank you for entrusting me with your care and for choosing [facility], Dr. Eugenia Hess  _______________________________________________________  If your blood pressure at your visit was 140/90 or greater, please contact your primary care physician to follow up on this.  _______________________________________________________  If you are age 76 or older, your body mass index should be between 23-30. Your Body mass index is 38.28 kg/m. If this is out of the aforementioned range listed, please consider follow up with your Primary Care Provider.  If you are age 92 or younger, your body mass index should be between 19-25. Your Body mass index is 38.28 kg/m. If this is out of the aformentioned range listed, please consider follow up with your Primary Care Provider.   ________________________________________________________  The [name] providers would like to encourage you to use MYCHART to communicate  with providers for non-urgent requests or questions.  Due to long hold times on the telephone, sending your provider a message by [facility] may be a faster and more efficient way to get a response.  Please allow 48 business hours for a response.  Please remember that this is for non-urgent requests.  _______________________________________________________

## 2023-10-14 ENCOUNTER — Telehealth: Payer: Self-pay

## 2023-10-14 DIAGNOSIS — K5150 Left sided colitis without complications: Secondary | ICD-10-CM

## 2023-10-14 DIAGNOSIS — K5180 Other ulcerative colitis without complications: Secondary | ICD-10-CM

## 2023-10-14 NOTE — Telephone Encounter (Signed)
-----   Message from Truddie Furrow sent at 10/12/2023  9:46 PM EDT ----- Regarding: Updated fecal calprotectin test Pod B team -  Ms. Hamelin will be submitting an H. pylori stool antigen as soon in the next 2 weeks.  Can you please contact her son and also have them submit stool specimens for fecal calprotectin?  We stopped her budesonide  for UC back in April because it was unclear if it was making a difference and was quite costly.  Her last fecal calprotectin was mildly elevated and I would like to see the trend in inflammation since she has been off of the drug.  Thanks,  Haskell Linker

## 2023-10-14 NOTE — Telephone Encounter (Signed)
 Fecal calprotectin order in epic. MyChart message sent to patient.

## 2023-10-16 DIAGNOSIS — J479 Bronchiectasis, uncomplicated: Secondary | ICD-10-CM | POA: Diagnosis not present

## 2023-10-16 DIAGNOSIS — J9611 Chronic respiratory failure with hypoxia: Secondary | ICD-10-CM | POA: Diagnosis not present

## 2023-10-16 NOTE — Telephone Encounter (Signed)
 Rc'd signed CMN. Will fax to [employer] at [phone]. Once confirmation rec'd will attach and send to scan.

## 2023-10-21 ENCOUNTER — Ambulatory Visit: Admitting: Pediatrics

## 2023-10-21 NOTE — Telephone Encounter (Signed)
 Called and spoke with Elsie since [place] message was not reviewed. They picked up the H. Pylori stool kit already but have not submitted sample. I was reviewing information when Elsie informed me that he was in a meeting and asked that I call him back. He confirmed that he has access to patient's MyChart and I informed him that I sent a message with information already. Elsie verbalized understanding.  MyChart message sent today with update

## 2023-10-23 ENCOUNTER — Telehealth (INDEPENDENT_AMBULATORY_CARE_PROVIDER_SITE_OTHER): Admitting: Family Medicine

## 2023-10-23 ENCOUNTER — Encounter: Payer: Self-pay | Admitting: Family Medicine

## 2023-10-23 DIAGNOSIS — M5441 Lumbago with sciatica, right side: Secondary | ICD-10-CM | POA: Diagnosis not present

## 2023-10-23 DIAGNOSIS — G8929 Other chronic pain: Secondary | ICD-10-CM

## 2023-10-23 DIAGNOSIS — M5442 Lumbago with sciatica, left side: Secondary | ICD-10-CM | POA: Diagnosis not present

## 2023-10-23 MED ORDER — MORPHINE SULFATE ER 30 MG PO TBCR: 30.0000 mg | EXTENDED_RELEASE_TABLET | Freq: Two times a day (BID) | ORAL | 0 refills | Status: DC

## 2023-10-23 NOTE — Progress Notes (Signed)
 Subjective:    Patient ID: [name], female    DOB: 1940-11-02, 83 y.o.   MRN: [id number]  HPI Virtual Visit via Video Note  I connected with the patient on 10/23/23 at 11:00 AM EDT by a video enabled telemedicine application and verified that I am speaking with the correct person using two identifiers.  Location patient: home Location provider:work or home office Persons participating in the virtual visit: patient, provider  I discussed the limitations of evaluation and management by telemedicine and the availability of in person appointments. The patient expressed understanding and agreed to proceed.   HPI: Here for pain management. She is doing well, however we have found out that no pharmacy in the area has the Hydromorphone  that she has been taking in any strength. She understands that we will need to try something else.    ROS: See pertinent positives and negatives per HPI.  Past Medical History:  Diagnosis Date   Adenomatous colon polyp 02/2006   Allergy    Allergy, unspecified not elsewhere classified    Anemia    Anxiety    Arthritis    Asthma    Blood transfusion without reported diagnosis    Bronchiectasis    CAD (coronary artery disease)    BMS to the LAD, 2002; cath 12/07/11 patent LAD stent and mild nonobstructive disease, EF 65%; Medical management   Cataract    removed both eyes   CHF (congestive heart failure) (HCC)    Diastolic   Chronic diastolic CHF (congestive heart failure) (HCC) 11/25/2011   Clotting disorder (HCC)    in legs per pt    Diabetes mellitus    Diverticulosis of colon    DJD (degenerative joint disease)    Fibromyalgia    GERD (gastroesophageal reflux disease)    H. pylori infection 05/12/2019   Hypercholesterolemia    Hypertension    Microscopic hematuria    Neoplasm of kidney    Obesity    Other diseases of lung, not elsewhere classified    Pinched vertebral nerve    Pulmonary nodule    Negative PET in 2010   Stress  incontinence, female    Syncope    Ulcerative colitis, left sided (HCC) 2008   Hx of   UTI (lower urinary tract infection)    Vitamin D  deficiency disease     Past Surgical History:  Procedure Laterality Date   ABDOMINAL HYSTERECTOMY     CARDIAC CATHETERIZATION  11/2011   CATARACT EXTRACTION, BILATERAL  2020   CHOLECYSTECTOMY     COLONOSCOPY  01/09/2018   per Dr. Aneita, adenomatous polyps, repeat in 3 yrs   CORONARY STENT PLACEMENT  2002   BMS to the LAD   ESOPHAGOGASTRODUODENOSCOPY  12/2008   HAND SURGERY  01/2006   Right Hand Surgery by Dr. Camella   LEFT HEART CATHETERIZATION WITH CORONARY ANGIOGRAM N/A 12/07/2011   Procedure: LEFT HEART CATHETERIZATION WITH CORONARY ANGIOGRAM;  Surgeon: Peter M Swaziland, MD;  Location: [facility] CATH LAB;  Service: Cardiovascular;  Laterality: N/A;   RIGHT/LEFT HEART CATH AND CORONARY ANGIOGRAPHY N/A 08/07/2021   Procedure: RIGHT/LEFT HEART CATH AND CORONARY ANGIOGRAPHY;  Surgeon: Cherrie Toribio SAUNDERS, MD;  Location: [facility] INVASIVE CV LAB;  Service: Cardiovascular;  Laterality: N/A;   UPPER GASTROINTESTINAL ENDOSCOPY      Family History  Problem Relation Age of Onset   Pneumonia Father    Heart attack Father    Colon polyps Father    Parkinsonism Mother    Diabetes  Brother    Sarcoidosis Brother    Hypertension Sister    Diabetes Sister    Breast cancer Daughter    Colon cancer Neg Hx    Esophageal cancer Neg Hx    Rectal cancer Neg Hx    Stomach cancer Neg Hx      Current Outpatient Medications:    acetaminophen  (TYLENOL ) 500 MG tablet, Take 500 mg by mouth every 6 (six) hours as needed for headache (pain). , Disp: , Rfl:    albuterol  (PROVENTIL ) (2.5 MG/3ML) 0.083% nebulizer solution, Take 3 mLs (2.5 mg total) by nebulization every 4 (four) hours as needed for wheezing or shortness of breath., Disp: 75 mL, Rfl: 3   albuterol  (VENTOLIN  HFA) 108 (90 BASE) MCG/ACT inhaler, Inhale 2 puffs into the lungs every 4 (four) hours as needed for wheezing  or shortness of breath., Disp: 18 each, Rfl: 11   ALPRAZolam  (XANAX ) 0.5 MG tablet, TAKE 1 TABLET BY MOUTH 3 TIMES DAILY AS NEEDED., Disp: 90 tablet, Rfl: 5   amLODipine  (NORVASC ) 10 MG tablet, TAKE 1 TABLET BY MOUTH EVERY DAY, Disp: 90 tablet, Rfl: 3   atorvastatin  (LIPITOR) 20 MG tablet, Take 1 tablet (20 mg total) by mouth daily., Disp: 90 tablet, Rfl: 3   azelastine (ASTELIN) 0.1 % nasal spray, Place 1 spray into both nostrils at bedtime., Disp: , Rfl:    Blood Glucose Monitoring Suppl (ONETOUCH VERIO FLEX SYSTEM) w/Device KIT, 1 each by Does not apply route daily at 12 noon., Disp: 1 kit, Rfl: 0   budesonide  (ENTOCORT EC ) 3 MG 24 hr capsule, Take 3 capsules (9 mg total) by mouth daily. Please take for one more month then discontinue., Disp: 270 capsule, Rfl: 0   budesonide -formoterol  (SYMBICORT) 160-4.5 MCG/ACT inhaler, Inhale 2 puffs into the lungs 2 (two) times daily., Disp: , Rfl:    Cholecalciferol  (VITAMIN D -3) 5000 UNITS TABS, Patient takes 1 tablet by mouth once a week on Mondays., Disp: , Rfl:    ciclopirox  (PENLAC ) 8 % solution, Apply topically at bedtime. Apply over nail. Apply daily over previous coat. Remove weekly with polish remover., Disp: 6.6 mL, Rfl: 11   clopidogrel  (PLAVIX ) 75 MG tablet, TAKE 1 TABLET BY MOUTH DAILY. PLEASE CALL [phone] TO SCHEDULE AN OVERDUE APPOINTMENT WITH DR. MARK SKAINS FOR FUTURE REFILLS. THANK YOU. 2ND ATTEMPT., Disp: 90 tablet, Rfl: 3   clotrimazole -betamethasone  (LOTRISONE ) cream, APPLY 1 APPLICATION TOPICALLY TWICE DAILY TO AFFECTED AREAS OF FEET AND ANKLES FOR 4 WEEKS, Disp: 45 g, Rfl: 3   colchicine 0.6 MG tablet, Take 0.6 mg by mouth 2 (two) times daily., Disp: , Rfl:    colestipol  (COLESTID ) 1 g tablet, Take 2 tablets (2 g total) by mouth 2 (two) times daily., Disp: 120 tablet, Rfl: 3   dicyclomine  (BENTYL ) 10 MG capsule, Take 1 capsule (10 mg total) by mouth 3 (three) times daily as needed for spasms., Disp: 90 capsule, Rfl: 3    diphenoxylate -atropine  (LOMOTIL ) 2.5-0.025 MG tablet, TAKE 1 TABLET BY MOUTH TWICE A DAY AS NEEDED FOR DIARRHEA AND LOOSE STOOL, Disp: 60 tablet, Rfl: 0   FARXIGA  10 MG TABS tablet, TAKE 1 TABLET BY MOUTH EVERY DAY, Disp: 30 tablet, Rfl: 5   fluticasone  (FLONASE ) 50 MCG/ACT nasal spray, PLACE 2 SPRAYS IN EACH NOSTRIL AT BEDTIME, Disp: 16 g, Rfl: 6   gabapentin  (NEURONTIN ) 300 MG capsule, Take 1 capsule (300 mg total) by mouth 2 (two) times daily., Disp: 60 capsule, Rfl: 11   glimepiride  (AMARYL ) 1 MG  tablet, TAKE 2 TABLETS (2 MG TOTAL) BY MOUTH DAILY WITH BREAKFAST, Disp: 180 tablet, Rfl: 0   glucose blood (ONETOUCH VERIO) test strip, Use to check blood glucose once daily, Disp: 100 strip, Rfl: 1   HYDROcodone  bit-homatropine (HYCODAN) 5-1.5 MG/5ML syrup, Take 5 mLs by mouth every 4 (four) hours as needed., Disp: 240 mL, Rfl: 0   HYDROmorphone  (DILAUDID ) 4 MG tablet, Take 1 tablet (4 mg total) by mouth every 6 (six) hours as needed for severe pain (pain score 7-10)., Disp: 120 tablet, Rfl: 0   hydrOXYzine  (ATARAX ) 10 MG tablet, Take 10 mg by mouth every 6 (six) hours as needed., Disp: , Rfl:    JANUVIA  100 MG tablet, TAKE 1 TABLET BY MOUTH EVERY DAY, Disp: 90 tablet, Rfl: 3   ketoconazole  (NIZORAL ) 2 % cream, Apply 1 Application topically daily. Apply 1gm to bilateral foot  once daily, Disp: 60 g, Rfl: 2   levocetirizine (XYZAL ) 5 MG tablet, Take 5 mg by mouth every evening., Disp: , Rfl:    levofloxacin  (LEVAQUIN ) 500 MG tablet, Take 1 tablet (500 mg total) by mouth daily., Disp: 10 tablet, Rfl: 0   LIDODERM  5 %, APPLY 1 PATCH TO SKIN FOR 12 HOURS THEN REMOVE AND DISCARD PATCH WITHIN 72 HOURS OR AS DIRECTED, Disp: 30 patch, Rfl: 2   loperamide (IMODIUM) 2 MG capsule, Take 4-6 mg by mouth daily. Taking as needed only, Disp: , Rfl:    losartan  (COZAAR ) 25 MG tablet, Take 1 tablet (25 mg total) by mouth daily., Disp: 90 tablet, Rfl: 3   magnesium  oxide (MAG-OX) 400 MG tablet, TAKE 3 TABLETS (1,200 MG  TOTAL) BY MOUTH DAILY., Disp: 120 tablet, Rfl: 4   mesalamine  (LIALDA ) 1.2 g EC tablet, Take 2 tablets (2.4 g total) by mouth 2 (two) times daily., Disp: 360 tablet, Rfl: 1   metoprolol  succinate (TOPROL -XL) 100 MG 24 hr tablet, TAKE 1 TABLET BY MOUTH DAILY. TAKE WITH OR IMMEDIATELY FOLLOWING A MEAL., Disp: 90 tablet, Rfl: 1   montelukast  (SINGULAIR ) 10 MG tablet, Take one every morning, Disp: 90 tablet, Rfl: 3   nitroGLYCERIN  (NITROSTAT ) 0.4 MG SL tablet, PLACE 1 TABLET UNDER THE TONGUE EVERY 5 MINUTES AS NEEDED FOR CHEST PAIN., Disp: 75 tablet, Rfl: 2   Olopatadine  HCl 0.7 % SOLN, Place 1 drop into both eyes daily as needed (itching/allergies)., Disp: , Rfl:    OneTouch Delica Lancets 33G MISC, 1 each by Does not apply route daily at 12 noon., Disp: 100 each, Rfl: 0   Pancrelipase , Lip-Prot-Amyl, (ZENPEP ) 20000-63000 units CPEP, TAKE 2 CAPSULES (40,000 UNITS) WITH MEALS AND 1 CAPSULE (20,000 UNITS) WITH A SNACK. Max daily dose of 8 capsules (160,000 units)., Disp: 240 capsule, Rfl: 1   Pancrelipase , Lip-Prot-Amyl, (ZENPEP ) 40000-126000 units CPEP, Take by mouth. Take 2 tablets by mouth before meals and 1 tablet before snacks, Disp: , Rfl:    pantoprazole  (PROTONIX ) 40 MG tablet, Take 1 tablet (40 mg total) by mouth 2 (two) times daily before a meal., Disp: 180 tablet, Rfl: 0   Polyethyl Glycol-Propyl Glycol (SYSTANE OP), Place 1 drop into both eyes daily. For dry eyes, Disp: , Rfl:    potassium chloride  SA (KLOR-CON  M) 20 MEQ tablet, TAKE 1 TABLET BY MOUTH TWICE A DAY, Disp: 180 tablet, Rfl: 3   promethazine  (PHENERGAN ) 25 MG tablet, Take 0.5 tablets (12.5 mg total) by mouth daily. As needed for nausea/ vomiting., Disp: 30 tablet, Rfl: 1   tiZANidine  (ZANAFLEX ) 4 MG tablet, Take 4  mg by mouth 2 (two) times daily., Disp: , Rfl:    torsemide  (DEMADEX ) 20 MG tablet, TAKE 2 TABLETS BY MOUTH EVERY DAY, Disp: 180 tablet, Rfl: 0   Vitamin D , Ergocalciferol , (DRISDOL) 1.25 MG (50000 UNIT) CAPS capsule, Take  50,000 Units by mouth every 14 (fourteen) days., Disp: , Rfl:    HYDROmorphone  (DILAUDID ) 4 MG tablet, Take 1 tablet (4 mg total) by mouth every 6 (six) hours as needed for severe pain (pain score 7-10). (Patient not taking: Reported on 10/23/2023), Disp: 120 tablet, Rfl: 0   HYDROmorphone  (DILAUDID ) 4 MG tablet, Take 1 tablet (4 mg total) by mouth every 6 (six) hours as needed for severe pain (pain score 7-10). (Patient not taking: Reported on 10/23/2023), Disp: 120 tablet, Rfl: 0  Current Facility-Administered Medications:    ipratropium-albuterol  (DUONEB) 0.5-2.5 (3) MG/3ML nebulizer solution 3 mL, 3 mL, Nebulization, Once, Nafziger, Darleene, NP  EXAM:  VITALS per patient if applicable:  GENERAL: alert, oriented, appears well and in no acute distress  HEENT: atraumatic, conjunttiva clear, no obvious abnormalities on inspection of external nose and ears  NECK: normal movements of the head and neck  LUNGS: on inspection no signs of respiratory distress, breathing rate appears normal, no obvious gross SOB, gasping or wheezing  CV: no obvious cyanosis  MS: moves all visible extremities without noticeable abnormality  PSYCH/NEURO: pleasant and cooperative, no obvious depression or anxiety, speech and thought processing grossly intact  ASSESSMENT AND PLAN: Pain management.  Indication for chronic opioid: low back pain Medication and dose: MS Contin  30 mg # pills per month: 60 Last UDS date: 08-08-23 Opioid Treatment Agreement signed (Y/N): 10-28-18 Opioid Treatment Agreement last reviewed with patient:  10-23-23 NCCSRS reviewed this encounter (include red flags): Yes We will send in a one month supply of this for her to try. She will report back to us  in a week or two. Garnette Olmsted, MD   Discussed the following assessment and plan:  No diagnosis found.     I discussed the assessment and treatment plan with the patient. The patient was provided an opportunity to ask questions and all  were answered. The patient agreed with the plan and demonstrated an understanding of the instructions.   The patient was advised to call back or seek an in-person evaluation if the symptoms worsen or if the condition fails to improve as anticipated.      Review of Systems     Objective:   Physical Exam        Assessment & Plan:

## 2023-10-24 ENCOUNTER — Other Ambulatory Visit: Payer: Self-pay | Admitting: Family Medicine

## 2023-10-27 ENCOUNTER — Other Ambulatory Visit: Payer: Self-pay | Admitting: Nurse Practitioner

## 2023-10-27 ENCOUNTER — Other Ambulatory Visit: Payer: Self-pay | Admitting: Cardiology

## 2023-11-01 DIAGNOSIS — E662 Morbid (severe) obesity with alveolar hypoventilation: Secondary | ICD-10-CM | POA: Diagnosis not present

## 2023-11-01 DIAGNOSIS — J9610 Chronic respiratory failure, unspecified whether with hypoxia or hypercapnia: Secondary | ICD-10-CM | POA: Diagnosis not present

## 2023-11-04 ENCOUNTER — Telehealth: Payer: Self-pay

## 2023-11-04 NOTE — Telephone Encounter (Signed)
 Copied from CRM [id number]. Topic: General - Other >> Nov 04, 2023  1:11 PM Henretta I wrote: Reason for CRM: Patients son devone bonilla would like a call back from the nurse to discuss patients health with new medication that was prescribed as doctor wanted to know how the medication was going.

## 2023-11-06 ENCOUNTER — Other Ambulatory Visit: Payer: Self-pay

## 2023-11-10 ENCOUNTER — Other Ambulatory Visit: Payer: Self-pay | Admitting: Family Medicine

## 2023-11-10 ENCOUNTER — Other Ambulatory Visit: Payer: Self-pay | Admitting: Cardiology

## 2023-11-10 ENCOUNTER — Other Ambulatory Visit: Payer: Self-pay | Admitting: Pediatrics

## 2023-11-12 ENCOUNTER — Telehealth: Payer: Self-pay

## 2023-11-12 ENCOUNTER — Encounter: Payer: Self-pay | Admitting: Internal Medicine

## 2023-11-12 ENCOUNTER — Other Ambulatory Visit

## 2023-11-12 DIAGNOSIS — K5150 Left sided colitis without complications: Secondary | ICD-10-CM | POA: Diagnosis not present

## 2023-11-12 DIAGNOSIS — A048 Other specified bacterial intestinal infections: Secondary | ICD-10-CM | POA: Diagnosis not present

## 2023-11-12 MED ORDER — MORPHINE SULFATE ER 30 MG PO TBCR: 30.0000 mg | EXTENDED_RELEASE_TABLET | Freq: Two times a day (BID) | ORAL | 0 refills | Status: DC

## 2023-11-12 NOTE — Addendum Note (Signed)
 Addended by: JOHNNY SENIOR A on: 11/12/2023 05:30 PM   Modules accepted: Orders

## 2023-11-12 NOTE — Telephone Encounter (Signed)
 That sounds good. I will send in refills for another 2 months to last until our next pain management visit (MS Contin )

## 2023-11-12 NOTE — Telephone Encounter (Signed)
 Copied from CRM [id number]. Topic: Clinical - Medication Question >> Nov 08, 2023  2:32 PM Jasmin G wrote: Reason for CRM: Patient's daughter stated that doctor put the patient on a medication tryout for 15 days and was told to call back the doctor to let him know if it was working well for her so she could get a refill. Patient's daughter stated that indeed it is working for her, please send refill to: [id number] - [place], [place] - [address] AT [address] [place] [address] Phone: [phone] Fax: [phone]

## 2023-11-13 ENCOUNTER — Telehealth: Payer: Self-pay | Admitting: Pediatrics

## 2023-11-13 MED ORDER — COLESTIPOL HCL 1 G PO TABS: 2.0000 g | ORAL_TABLET | Freq: Two times a day (BID) | ORAL | 0 refills | Status: DC

## 2023-11-13 NOTE — Telephone Encounter (Signed)
 PT son Elsie calling about getting another refill for colestipol ( 90 day supply) sent to [facility] on [address]. PT accidentally discarded the medication. Please advise.

## 2023-11-13 NOTE — Telephone Encounter (Signed)
 I spoke to [name], Tanvi's son, and I advised him that the refill was sent out.  He advised that she is out completely.

## 2023-11-13 NOTE — Telephone Encounter (Signed)
 Spoke with Elsie, patient's son and informed him of the message below.

## 2023-11-14 ENCOUNTER — Ambulatory Visit: Payer: Self-pay | Admitting: Pediatrics

## 2023-11-14 LAB — H. PYLORI ANTIGEN, STOOL: H pylori Ag, Stl: NEGATIVE

## 2023-11-14 LAB — CALPROTECTIN, FECAL: Calprotectin, Fecal: 122 ug/g — ABNORMAL HIGH (ref 0–120)

## 2023-11-15 DIAGNOSIS — J479 Bronchiectasis, uncomplicated: Secondary | ICD-10-CM | POA: Diagnosis not present

## 2023-11-15 DIAGNOSIS — J9611 Chronic respiratory failure with hypoxia: Secondary | ICD-10-CM | POA: Diagnosis not present

## 2023-11-25 ENCOUNTER — Other Ambulatory Visit: Payer: Self-pay | Admitting: Internal Medicine

## 2023-11-27 ENCOUNTER — Other Ambulatory Visit: Payer: Self-pay | Admitting: Family Medicine

## 2023-12-02 DIAGNOSIS — E662 Morbid (severe) obesity with alveolar hypoventilation: Secondary | ICD-10-CM | POA: Diagnosis not present

## 2023-12-02 DIAGNOSIS — J9610 Chronic respiratory failure, unspecified whether with hypoxia or hypercapnia: Secondary | ICD-10-CM | POA: Diagnosis not present

## 2023-12-03 NOTE — Progress Notes (Signed)
 Cardiology Office Note:  .   Date:  12/17/2023  ID:  [name], DOB 1940-06-08, MRN [id number] PCP: [name], MD   HeartCare Providers Cardiologist:  Oneil Parchment, MD    History of Present Illness: .   [name] is a 83 y.o. female with chronic diastolic heart failure, secondary pulmonary hypertension, hyperlipidemia, diabetes, ulcerative colitis, and morbid obesity,She has a history of bare metal stent placement in the proximal LAD, with subsequent cardiac catheterizations showing a patent stent. In April 2023, she experienced a significant health event where she was found unresponsive, hypotensive, and bradycardic, with concerns for sepsis. Her ejection fraction at that time was 35-40%, with moderately reduced RV function. Cardiac catheterization revealed nonobstructive CAD with a 90% lesion in the apical/distal LAD, normal overall LV function, and moderate pulmonary artery hypertension. She was eventually weaned off pressors and discharged home with BiPAP.  Patient comes in with her daughter. She says she had chest pain 3 times last week at rest. She says it was sharp shooting like a knife and took a NTG with relief. She had another episode at rest yesterday.  Denies chest pressure, shortness of breath, palpitations, dizziness. Walks with a walker around the house and does some cooking and no chest pain with activity.She doesn't remember what her prior angina felt like. Denies GI symptoms. She has chronic edema.She has lost 20 lbs since April. Not eating much.   ROS:    Studies Reviewed: SABRA         Prior CV Studies:   Echo 03/2022 IMPRESSIONS     1. Left ventricular ejection fraction, by estimation, is 50 to 55%. The  left ventricle has low normal function. The left ventricle has no regional  wall motion abnormalities. Left ventricular diastolic parameters are  consistent with Grade I diastolic  dysfunction (impaired relaxation).   2. Right ventricular systolic  function is normal. The right ventricular  size is mildly enlarged. There is mildly elevated pulmonary artery  systolic pressure. The estimated right ventricular systolic pressure is  39.5 mmHg.   3. Left atrial size was moderately dilated.   4. Right atrial size was mildly dilated.   5. The mitral valve is degenerative. Mild mitral valve regurgitation.  Moderate mitral annular calcification.   6. Tricuspid valve regurgitation is mild to moderate.   7. The aortic valve was not well visualized. Aortic valve regurgitation  is trivial. Aortic valve sclerosis/calcification is present, without any  evidence of aortic stenosis.   8. Aortic dilatation noted. There is mild dilatation of the ascending  aorta, measuring 41 mm.   9. The inferior vena cava is normal in size with greater than 50%  respiratory variability, suggesting right atrial pressure of 3 mmHg.  10. Frequent PACs throughout examination.   Comparison(s): Compared to prior TTE on 07/2021, the EF appears improved  to 50-55% (previously reported 35-40%). The filling pressures also look  significantly improved. TR now appears mild to moderate (previously  moderate to severe).    Risk Assessment/Calculations:             Physical Exam:   VS:  BP 130/78 (BP Location: Right Arm, Patient Position: Sitting, Cuff Size: Large)   Pulse 94   Ht 5' (1.524 m)   Wt 210 lb 9.6 oz (95.5 kg)   LMP  (LMP Unknown)   SpO2 96%   BMI 41.13 kg/m    Orhtostatics: No data found. Wt Readings from Last 3 Encounters:  12/17/23 210 lb  9.6 oz (95.5 kg)  10/10/23 196 lb (88.9 kg)  10/09/23 216 lb 3.2 oz (98.1 kg)    GEN: Well nourished, well developed in no acute distress NECK: No JVD; No carotid bruits CARDIAC:  RRR, no murmurs, rubs, gallops RESPIRATORY:  Clear to auscultation without rales, wheezing or rhonchi  ABDOMEN: Soft, non-tender, non-distended EXTREMITIES:  No edema; No deformity   ASSESSMENT AND PLAN: .     Chronic diastolic  heart failure with secondary pulmonary hypertension. EF 35-40% (April 2023). RV moderately reduced. Right and left heart catheterization showed nonobstructive CAD with 90% lesion in the apical/distal LAD. Patient declined SGLT2 inhibitor due to prolonged QT (up to 514 ms).Right and left heart catheterization showed nonobstructive CAD with 90% lesion in the apical/distal LAD.  Echo 03/2022 EF 50-55% - Continue losartan  25 mg daily - Continue metoprolol  succinate 100 mg daily - Continue torsemide  20 mg, two tablets daily - Take an extra 20 mg torsemide  for 3-4 days if fluid accumulation is noted -refills given   CAD Right and left heart catheterization showed nonobstructive CAD with 90% lesion in the apical/distal LAD 2023. BMS stent in pLAD patent -having chest pain in the past week. Atypical, sharp, at rest but relieved with NTG. Will add Imdur  30 mg once daily to see if this helps. Very sedentary lifestyle.   Pulmonary Hypertension Moderate pulmonary artery hypertension likely group 3. Improved breathing with BiPAP and Trelegy inhaler. Avoid Zofran , hydroxyzine , loperamide, Zanaflex  due to prolonged QT interval. - Continue BiPAP - Continue Trelegy inhaler    Hyperlipidemia Hyperlipidemia with LDL goal <55. Prior LDL 66 (06/16/2021). On atorvastatin . - Continue atorvastatin  20 mg daily -managed by PCP-no recent labs in computer    Diabetes Mellitus Diabetes mellitus with hemoglobin A1c of 6.8. Well-managed with current regimen. - Continue current diabetes management    Morbid Obesity Morbid obesity, has lost 20 lbs-not eating as much              Dispo: f/u Dr. Jeffrie 6 months or sooner if needed.  Signed, Olivia Pavy, PA-C

## 2023-12-15 ENCOUNTER — Other Ambulatory Visit: Payer: Self-pay | Admitting: Family Medicine

## 2023-12-16 ENCOUNTER — Other Ambulatory Visit: Payer: Self-pay

## 2023-12-16 ENCOUNTER — Telehealth: Payer: Self-pay | Admitting: Pediatrics

## 2023-12-16 ENCOUNTER — Ambulatory Visit: Admitting: Adult Health

## 2023-12-16 ENCOUNTER — Telehealth: Payer: Self-pay | Admitting: Cardiology

## 2023-12-16 DIAGNOSIS — J9611 Chronic respiratory failure with hypoxia: Secondary | ICD-10-CM | POA: Diagnosis not present

## 2023-12-16 DIAGNOSIS — J479 Bronchiectasis, uncomplicated: Secondary | ICD-10-CM | POA: Diagnosis not present

## 2023-12-16 MED ORDER — CLOPIDOGREL BISULFATE 75 MG PO TABS: 75.0000 mg | ORAL_TABLET | Freq: Every day | ORAL | 0 refills | Status: DC

## 2023-12-16 NOTE — Telephone Encounter (Signed)
*  STAT* If patient is at the pharmacy, call can be transferred to refill team.   1. Which medications need to be refilled? (please list name of each medication and dose if known)  clopidogrel  (PLAVIX ) 75 MG tablet    2. Would you like to learn more about the convenience, safety, & potential cost savings by using the [facility]?      3. Are you open to using the [facility] (Type [facility].  ).   4. Which pharmacy/location (including street and city if local pharmacy) is medication to be sent to? [id number] [place], [place] - [address]    5. Do they need a 30 day or 90 day supply? 90 day

## 2023-12-16 NOTE — Telephone Encounter (Signed)
 Inbound call from patient's son requesting a refill for Lomotil . Please advise, thank you

## 2023-12-17 ENCOUNTER — Encounter: Payer: Self-pay | Admitting: Physician Assistant

## 2023-12-17 ENCOUNTER — Ambulatory Visit: Attending: Physician Assistant | Admitting: Physician Assistant

## 2023-12-17 VITALS — BP 130/78 | HR 94 | Ht 60.0 in | Wt 210.6 lb

## 2023-12-17 DIAGNOSIS — E119 Type 2 diabetes mellitus without complications: Secondary | ICD-10-CM | POA: Diagnosis not present

## 2023-12-17 DIAGNOSIS — I272 Pulmonary hypertension, unspecified: Secondary | ICD-10-CM

## 2023-12-17 DIAGNOSIS — I5042 Chronic combined systolic (congestive) and diastolic (congestive) heart failure: Secondary | ICD-10-CM

## 2023-12-17 DIAGNOSIS — I10 Essential (primary) hypertension: Secondary | ICD-10-CM

## 2023-12-17 DIAGNOSIS — E7849 Other hyperlipidemia: Secondary | ICD-10-CM

## 2023-12-17 DIAGNOSIS — R079 Chest pain, unspecified: Secondary | ICD-10-CM | POA: Diagnosis not present

## 2023-12-17 MED ORDER — AMLODIPINE BESYLATE 10 MG PO TABS
10.0000 mg | ORAL_TABLET | Freq: Every day | ORAL | 3 refills | Status: AC
Start: 2023-12-17 — End: ?

## 2023-12-17 MED ORDER — METOPROLOL SUCCINATE ER 100 MG PO TB24
100.0000 mg | ORAL_TABLET | Freq: Every day | ORAL | 3 refills | Status: AC
Start: 2023-12-17 — End: ?

## 2023-12-17 MED ORDER — TORSEMIDE 20 MG PO TABS: 40.0000 mg | ORAL_TABLET | Freq: Every day | ORAL | 3 refills | Status: AC

## 2023-12-17 MED ORDER — CLOPIDOGREL BISULFATE 75 MG PO TABS: 75.0000 mg | ORAL_TABLET | Freq: Every day | ORAL | 3 refills | Status: AC

## 2023-12-17 MED ORDER — ATORVASTATIN CALCIUM 20 MG PO TABS: 20.0000 mg | ORAL_TABLET | Freq: Every day | ORAL | 3 refills | Status: AC

## 2023-12-17 MED ORDER — LOSARTAN POTASSIUM 25 MG PO TABS
25.0000 mg | ORAL_TABLET | Freq: Every day | ORAL | 3 refills | Status: AC
Start: 2023-12-17 — End: ?

## 2023-12-17 MED ORDER — POTASSIUM CHLORIDE CRYS ER 20 MEQ PO TBCR
20.0000 meq | EXTENDED_RELEASE_TABLET | Freq: Two times a day (BID) | ORAL | 3 refills | Status: AC
Start: 2023-12-17 — End: ?

## 2023-12-17 MED ORDER — ISOSORBIDE MONONITRATE ER 30 MG PO TB24: 30.0000 mg | ORAL_TABLET | Freq: Every day | ORAL | 3 refills | Status: AC

## 2023-12-17 NOTE — Patient Instructions (Signed)
 Medication Instructions:   START TAKING : IMDUR  30 MG ONCE A DAY   *If you need a refill on your cardiac medications before your next appointment, please call your pharmacy*  Lab Work: NONE ORDERED  TODAY   If you have labs (blood work) drawn today and your tests are completely normal, you will receive your results only by: MyChart Message (if you have MyChart) OR A paper copy in the mail If you have any lab test that is abnormal or we need to change your treatment, we will call you to review the results.   Testing/Procedures: NONE ORDERED  TODAY    Follow-Up: At [facility], you and your health needs are our priority.  As part of our continuing mission to provide you with exceptional heart care, our providers are all part of one team.  This team includes your primary Cardiologist (physician) and Advanced Practice Providers or APPs (Physician Assistants and Nurse Practitioners) who all work together to provide you with the care you need, when you need it.  Your next appointment:   6 month(s)  Provider:   Oneil Parchment, MD    We recommend signing up for the patient portal called MyChart.  Sign up information is provided on this After Visit Summary.  MyChart is used to connect with patients for Virtual Visits (Telemedicine).  Patients are able to view lab/test results, encounter notes, upcoming appointments, etc.  Non-urgent messages can be sent to your provider as well.   To learn more about what you can do with MyChart, go to [email or web address].   Other Instructions

## 2023-12-19 MED ORDER — DIPHENOXYLATE-ATROPINE 2.5-0.025 MG PO TABS: 1.0000 | ORAL_TABLET | Freq: Two times a day (BID) | ORAL | 5 refills | Status: AC

## 2023-12-20 NOTE — Telephone Encounter (Signed)
 New Rx for Lomotil  sent to patient's pharmacy.

## 2023-12-31 ENCOUNTER — Other Ambulatory Visit: Payer: Self-pay | Admitting: Pediatrics

## 2024-01-02 DIAGNOSIS — E662 Morbid (severe) obesity with alveolar hypoventilation: Secondary | ICD-10-CM | POA: Diagnosis not present

## 2024-01-02 DIAGNOSIS — J9610 Chronic respiratory failure, unspecified whether with hypoxia or hypercapnia: Secondary | ICD-10-CM | POA: Diagnosis not present

## 2024-01-16 DIAGNOSIS — J479 Bronchiectasis, uncomplicated: Secondary | ICD-10-CM | POA: Diagnosis not present

## 2024-01-16 DIAGNOSIS — J9611 Chronic respiratory failure with hypoxia: Secondary | ICD-10-CM | POA: Diagnosis not present

## 2024-01-21 ENCOUNTER — Encounter: Payer: Self-pay | Admitting: Pediatrics

## 2024-02-01 DIAGNOSIS — J9610 Chronic respiratory failure, unspecified whether with hypoxia or hypercapnia: Secondary | ICD-10-CM | POA: Diagnosis not present

## 2024-02-01 DIAGNOSIS — E662 Morbid (severe) obesity with alveolar hypoventilation: Secondary | ICD-10-CM | POA: Diagnosis not present

## 2024-02-02 ENCOUNTER — Other Ambulatory Visit: Payer: Self-pay | Admitting: Pediatrics

## 2024-02-06 ENCOUNTER — Ambulatory Visit: Admitting: Adult Health

## 2024-02-07 ENCOUNTER — Other Ambulatory Visit: Payer: Self-pay | Admitting: Family Medicine

## 2024-02-07 DIAGNOSIS — E119 Type 2 diabetes mellitus without complications: Secondary | ICD-10-CM

## 2024-02-15 DIAGNOSIS — J9611 Chronic respiratory failure with hypoxia: Secondary | ICD-10-CM | POA: Diagnosis not present

## 2024-02-18 ENCOUNTER — Other Ambulatory Visit: Payer: Self-pay | Admitting: Family Medicine

## 2024-02-18 ENCOUNTER — Other Ambulatory Visit: Payer: Self-pay | Admitting: Pediatrics

## 2024-02-26 ENCOUNTER — Telehealth: Payer: Self-pay | Admitting: Pediatrics

## 2024-02-26 NOTE — Progress Notes (Signed)
 [facility] Return Visit   Referring Provider [name], MD [address] [place],  [place] [address]  Primary Care Provider [name], MD  Patient Profile: [name] is a 83 y.o. female with a past medical history noteworthy for CAD, CHF, T2 DM, fibromyalgia, HTN, HLD, OSA, who returns to the [facility] for follow-up of the problem(s) noted below.  Problem List: Left-sided ulcerative colitis diagnosed 2008 Pancreatic insufficiency dx'd 2020 IBS-D GERD H. pylori gastritis 03/2019 Personal history of tubular adenomas and sessile serrated polyps History of mild normocytic anemia Status post cholecystectomy   History of Present Illness   [name] was last seen in the GI office 10/10/2023   Current GI Meds  Lialda  2.4 g p.o. twice daily Zenpep  40,000 units 2 caps before meals, 1 cap before snacks Pantoprazole  40 mg p.o. twice daily Dicyclomine  10 mg p.o. 3 times daily Imodium as needed-cautioned regarding frequent use due to potential for QT prolongation Lomotil  twice daily as needed Colestid  grams p.o. twice daily  Interval History  [name] returns to the office today accompanied by her daughter-son [name] was available by telephone. She and her children's main concern is regarding symptoms of fecal urgency and chronic diarrhea  Discussed the use of AI scribe software for clinical note transcription with the patient, who gave verbal consent to proceed.  History of Present Illness At previous visits we dicussed that [name] carries multiple GI conditions that could be contributing to loose stool and diarrhea as outlined below:  Left-sided UC -- Diagnosed with left-sided UC in 2008 -- On maintenance Lialda  2.4 g p.o. twice daily -- Colonoscopy 2019 normal confirming endoscopic remission of disease -- 08/2022-fecal calprotectin elevated 943 and advised to use budesonide  x 3 months -- Son reports that she has been  using budesonide  on and off for management of symptoms of diarrhea -medication is quite costly and son queries if she needs it -- Fecal calprotectin 07/2023  291 -- Budesonide  discontinued spring 2025  Exocrine pancreatic insufficiency -- Diagnosed with EPI 12/2018 with fecal elastase 170 -- Maintained on Zenpep  40,000 units 2 caps before meals and 1 cap before snacks -- Family indicated they were not aware of the diagnosis but also has not had updated testing in the last 5 years -recommended updated testing -- No known history of pancreatitis or other pancreatic disorders -- Fecal fat and pancreatic elastase 07/2023 normal  IBS-D -- On dicyclomine  10 mg p.o. 3 times daily -- Utilizes Imodium as needed -advised by her cardiologist to be cautious regarding dosing -- Utilizes Lomotil  as needed  Other potential contributing factors to diarrhea -- Notes sensitivity to dairy and states she is using Lactaid products such as Lactaid milk in her cereal -- Has a history of cholecystectomy and could be experiencing bile acid malabsorption -- Medications she is taking that could cause diarrhea - colchicine, magnesium  oxide  --> magnesium  oxide discontinued 07/2023 -- Thyroid  and celiac testing within the last year were normal -- Reports both urinary and fecal incontinence at times -discussed potential issues with pelvic floor  H. pylori gastritis 2020 -- Diagnosed endoscopically on EGD biopsies -- Did not tolerate quadruple therapy -- Seen by ID and treated with amoxicillin  1 g p.o. twice daily, Levaquin  500 mg daily and PPI 40 mg p.o. twice daily -- H. pylori stool antigen 07/2023 positive --> treated with Talicia   --> H. pylori stool antigen - 10/2023  GERD -- Currently stable on pantoprazole  40 mg orally daily  Today's visit -- At last visit, Colestid  2 g p.o. twice daily was started which her family thought was helpful  -- Diarrhea with fecal incontinence occurring without prior awareness  began 3 weeks ago -- Previously had regular bowel movements once or twice daily -- No nocturnal episodes of diarrhea or incontinence -- Endorses bright red blood in stool but history is vague and difficult to quantify frequency or volume -- One episode of vomiting the night before last -- Endorses abdominal cramping with defecation  -- KUB 06/2023 read as increased stool mass consistent with constipation-family continues to maintain that they do not think she is constipated --she is on narcotic pain medicines  -- Current medications include budesonide  -had 10-day supply left and started using medication again, mesalamine , and dicyclomine  -- Unable to discern from family if resumption of budesonide  has been helpful -- Potential shortage of dicyclomine  -- Attempting to maintain adequate hydration  -- H. pylori stool antigen 10/2023 checked after treatment with Talicia  negative  Last colonoscopy: 12/2017 -no active inflammation, 3 polyps in DC, AC, ICV (TA' s), 2 medium size lipomas in TC and AC, left-sided diverticula Last endoscopy:  03/2019 -normal esophagus, diffuse moderate inflammation in stomach, 2 gastric papules, normal duodenum -H. pylori gastritis on pathology  Last Abd CT/CTE/MRE: MRI 05/2022 -bilateral renal lesions-probable complex cysts-follow-up recommended 6 to 12 months, possible constipation  GI Review of Symptoms Significant for diarrhea, incontinence. Otherwise negative.  General Review of Systems  Review of systems is significant for the pertinent positives and negatives as listed per the HPI.  Full ROS is otherwise negative.  Past Medical History   Past Medical History:  Diagnosis Date   Adenomatous colon polyp 02/2006   Allergy    Allergy, unspecified not elsewhere classified    Anemia    Anxiety    Arthritis    Asthma    Blood transfusion without reported diagnosis    Bronchiectasis    CAD (coronary artery disease)    BMS to the LAD, 2002; cath 12/07/11 patent  LAD stent and mild nonobstructive disease, EF 65%; Medical management   Cataract    removed both eyes   CHF (congestive heart failure) (HCC)    Diastolic   Chronic diastolic CHF (congestive heart failure) (HCC) 11/25/2011   Clotting disorder    in legs per pt    Diabetes mellitus    Diverticulosis of colon    DJD (degenerative joint disease)    Fibromyalgia    GERD (gastroesophageal reflux disease)    H. pylori infection 05/12/2019   Hypercholesterolemia    Hypertension    Microscopic hematuria    Neoplasm of kidney    Obesity    Other diseases of lung, not elsewhere classified    Pinched vertebral nerve    Pulmonary nodule    Negative PET in 2010   Stress incontinence, female    Syncope    Ulcerative colitis, left sided (HCC) 2008   Hx of   UTI (lower urinary tract infection)    Vitamin D  deficiency disease      Past Surgical History   Past Surgical History:  Procedure Laterality Date   ABDOMINAL HYSTERECTOMY     CARDIAC CATHETERIZATION  11/2011   CATARACT EXTRACTION, BILATERAL  2020   CHOLECYSTECTOMY     COLONOSCOPY  01/09/2018   per Dr. Aneita, adenomatous polyps, repeat in 3 yrs   CORONARY STENT PLACEMENT  2002   BMS to the LAD   ESOPHAGOGASTRODUODENOSCOPY  12/2008   HAND  SURGERY  01/2006   Right Hand Surgery by Dr. Camella   LEFT HEART CATHETERIZATION WITH CORONARY ANGIOGRAM N/A 12/07/2011   Procedure: LEFT HEART CATHETERIZATION WITH CORONARY ANGIOGRAM;  Surgeon: Peter M Jordan, MD;  Location: [facility] CATH LAB;  Service: Cardiovascular;  Laterality: N/A;   RIGHT/LEFT HEART CATH AND CORONARY ANGIOGRAPHY N/A 08/07/2021   Procedure: RIGHT/LEFT HEART CATH AND CORONARY ANGIOGRAPHY;  Surgeon: Cherrie Toribio SAUNDERS, MD;  Location: [facility] INVASIVE CV LAB;  Service: Cardiovascular;  Laterality: N/A;   UPPER GASTROINTESTINAL ENDOSCOPY       Allergies and Medications   Allergies  Allergen Reactions   Doxycycline Other (See Comments)    Unsteady gait   Aspirin  Nausea And  Vomiting   Azithromycin Other (See Comments)    REACTION: pt states ZPak doesn't work   Caffeine Nausea And Vomiting   Ciprofloxacin Nausea And Vomiting    Tolerates levaquin    Codeine Nausea And Vomiting   Oxycodone  Other (See Comments)    Pt stated, upsets my stomach (11/19/17)   Sulfamethizole Other (See Comments)    Unknown reaction   Tramadol Hcl Other (See Comments)    REACTION: pt states spaced-out    Current Meds  Medication Sig   acetaminophen  (TYLENOL ) 500 MG tablet Take 500 mg by mouth every 6 (six) hours as needed for headache (pain).    AIRSUPRA 90-80 MCG/ACT AERO 2 puffs as needed Inhalation prn up to 12 puffs a day; Duration: 30 days   albuterol  (PROVENTIL ) (2.5 MG/3ML) 0.083% nebulizer solution Take 3 mLs (2.5 mg total) by nebulization every 4 (four) hours as needed for wheezing or shortness of breath.   albuterol  (VENTOLIN  HFA) 108 (90 BASE) MCG/ACT inhaler Inhale 2 puffs into the lungs every 4 (four) hours as needed for wheezing or shortness of breath.   ALPRAZolam  (XANAX ) 0.5 MG tablet TAKE 1 TABLET BY MOUTH THREE TIMES A DAY AS NEEDED   amLODipine  (NORVASC ) 10 MG tablet Take 1 tablet (10 mg total) by mouth daily.   atorvastatin  (LIPITOR) 20 MG tablet Take 1 tablet (20 mg total) by mouth daily.   azelastine (ASTELIN) 0.1 % nasal spray Place 1 spray into both nostrils at bedtime.   Blood Glucose Monitoring Suppl (ONETOUCH VERIO FLEX SYSTEM) w/Device KIT 1 each by Does not apply route daily at 12 noon.   budesonide -formoterol  (SYMBICORT) 160-4.5 MCG/ACT inhaler Inhale 2 puffs into the lungs 2 (two) times daily.   Cholecalciferol  (VITAMIN D -3) 5000 UNITS TABS Patient takes 1 tablet by mouth once a week on Mondays.   ciclopirox  (PENLAC ) 8 % solution Apply topically at bedtime. Apply over nail. Apply daily over previous coat. Remove weekly with polish remover.   clopidogrel  (PLAVIX ) 75 MG tablet Take 1 tablet (75 mg total) by mouth daily.   clotrimazole -betamethasone   (LOTRISONE ) cream APPLY 1 APPLICATION TOPICALLY TWICE DAILY TO AFFECTED AREAS OF FEET AND ANKLES FOR 4 WEEKS   colchicine 0.6 MG tablet Take 0.6 mg by mouth 2 (two) times daily.   colestipol  (COLESTID ) 1 g tablet TAKE 2 TABLETS BY MOUTH 2 TIMES DAILY.   diphenoxylate -atropine  (LOMOTIL ) 2.5-0.025 MG tablet Take 1 tablet by mouth in the morning and at bedtime.   FARXIGA  10 MG TABS tablet TAKE 1 TABLET BY MOUTH EVERY DAY   fluticasone  (FLONASE ) 50 MCG/ACT nasal spray PLACE 2 SPRAYS IN EACH NOSTRIL AT BEDTIME   gabapentin  (NEURONTIN ) 300 MG capsule TAKE 1 CAPSULE BY MOUTH TWICE A DAY   glimepiride  (AMARYL ) 1 MG tablet TAKE 2 TABLETS (2 MG  TOTAL) BY MOUTH DAILY WITH BREAKFAST   HYDROcodone  bit-homatropine (HYCODAN) 5-1.5 MG/5ML syrup Take 5 mLs by mouth every 4 (four) hours as needed.   hydrOXYzine  (ATARAX ) 10 MG tablet Take 10 mg by mouth every 6 (six) hours as needed.   isosorbide  mononitrate (IMDUR ) 30 MG 24 hr tablet Take 1 tablet (30 mg total) by mouth daily.   JANUVIA  100 MG tablet TAKE 1 TABLET BY MOUTH EVERY DAY   ketoconazole  (NIZORAL ) 2 % cream Apply 1 Application topically daily. Apply 1gm to bilateral foot  once daily   levocetirizine (XYZAL ) 5 MG tablet Take 5 mg by mouth every evening.   levofloxacin  (LEVAQUIN ) 500 MG tablet Take 1 tablet (500 mg total) by mouth daily.   LIDODERM  5 % APPLY 1 PATCH TO SKIN FOR 12 HOURS THEN REMOVE AND DISCARD PATCH WITHIN 72 HOURS OR AS DIRECTED   loperamide (IMODIUM) 2 MG capsule Take 4-6 mg by mouth daily. Taking as needed only   losartan  (COZAAR ) 25 MG tablet Take 1 tablet (25 mg total) by mouth daily.   metoprolol  succinate (TOPROL -XL) 100 MG 24 hr tablet Take 1 tablet (100 mg total) by mouth daily. TAKE WITH OR IMMEDIATELY FOLLOWING A MEAL.   montelukast  (SINGULAIR ) 10 MG tablet Take one every morning   morphine  (MS CONTIN ) 30 MG 12 hr tablet Take 1 tablet (30 mg total) by mouth every 12 (twelve) hours.   morphine  (MS CONTIN ) 30 MG 12 hr tablet Take  1 tablet (30 mg total) by mouth every 12 (twelve) hours.   nitroGLYCERIN  (NITROSTAT ) 0.4 MG SL tablet PLACE 1 TABLET UNDER THE TONGUE EVERY 5 MINUTES AS NEEDED FOR CHEST PAIN.   Olopatadine  HCl 0.7 % SOLN Place 1 drop into both eyes daily as needed (itching/allergies).   OneTouch Delica Lancets 33G MISC 1 each by Does not apply route daily at 12 noon.   ONETOUCH VERIO test strip USE TO CHECK BLOOD GLUCOSE ONCE DAILY   Pancrelipase , Lip-Prot-Amyl, (ZENPEP ) 20000-63000 units CPEP TAKE 2 CAPSULES (40,000 UNITS) WITH MEALS AND 1 CAPSULE (20,000 UNITS) WITH A SNACK. Max daily dose of 8 capsules (160,000 units).   Pancrelipase , Lip-Prot-Amyl, (ZENPEP ) 40000-126000 units CPEP Take by mouth. Take 2 tablets by mouth before meals and 1 tablet before snacks   pantoprazole  (PROTONIX ) 40 MG tablet Take 1 tablet (40 mg total) by mouth 2 (two) times daily.   Polyethyl Glycol-Propyl Glycol (SYSTANE OP) Place 1 drop into both eyes daily. For dry eyes   potassium chloride  SA (KLOR-CON  M) 20 MEQ tablet Take 1 tablet (20 mEq total) by mouth 2 (two) times daily.   promethazine  (PHENERGAN ) 25 MG tablet TAKE 0.5 TABLETS (12.5 MG TOTAL) BY MOUTH DAILY. AS NEEDED FOR NAUSEA/ VOMITING.   tiZANidine  (ZANAFLEX ) 4 MG tablet Take 4 mg by mouth 2 (two) times daily.   torsemide  (DEMADEX ) 20 MG tablet Take 2 tablets (40 mg total) by mouth daily.   Vitamin D , Ergocalciferol , (DRISDOL) 1.25 MG (50000 UNIT) CAPS capsule Take 50,000 Units by mouth every 14 (fourteen) days.   [DISCONTINUED] budesonide  (ENTOCORT EC ) 3 MG 24 hr capsule Take 3 capsules (9 mg total) by mouth daily. Please take for one more month then discontinue.   [DISCONTINUED] dicyclomine  (BENTYL ) 10 MG capsule Take 1 capsule (10 mg total) by mouth 3 (three) times daily as needed for spasms.   [DISCONTINUED] mesalamine  (LIALDA ) 1.2 g EC tablet Take 2 tablets (2.4 g total) by mouth 2 (two) times daily.   Current Facility-Administered Medications for the  02/27/24  encounter (Office Visit) with Suzann [name] HERO, MD  Medication   ipratropium-albuterol  (DUONEB) 0.5-2.5 (3) MG/3ML nebulizer solution 3 mL     Family History   Family History  Problem Relation Age of Onset   Pneumonia Father    Heart attack Father    Colon polyps Father    Parkinsonism Mother    Diabetes Brother    Sarcoidosis Brother    Hypertension Sister    Diabetes Sister    Breast cancer Daughter    Colon cancer Neg Hx    Esophageal cancer Neg Hx    Rectal cancer Neg Hx    Stomach cancer Neg Hx      Social History   Social History   Tobacco Use   Smoking status: Former    Current packs/day: 0.00    Average packs/day: 2.0 packs/day for 15.0 years (30.0 ttl pk-yrs)    Types: Cigarettes    Start date: 04/30/1958    Quit date: 04/30/1973    Years since quitting: 50.8    Passive exposure: Past   Smokeless tobacco: Never   Tobacco comments:    ex-smoker: smoked for 10-15 years up to 2ppd, quit 1975.  Vaping Use   Vaping status: Never Used  Substance Use Topics   Alcohol  use: Not Currently    Alcohol /week: 0.0 standard drinks of alcohol     Comment: none now    Drug use: No   Liberti reports that she quit smoking about 50 years ago. Her smoking use included cigarettes. She started smoking about 65 years ago. She has a 30 pack-year smoking history. She has been exposed to tobacco smoke. She has never used smokeless tobacco. She reports that she does not currently use alcohol . She reports that she does not use drugs.  Vital Signs and Physical Examination   Vitals:   02/27/24 1104  BP: 110/68  Pulse: 70     Body mass index is 36.58 kg/m. Weight: 187 lb 5 oz (85 kg)  General: Elderly, sitting in wheelchair, memory recall seems somewhat impaired Head: Normocephalic and atraumatic Eyes: Sclerae anicteric, EOMI Lungs: Clear throughout to auscultation Heart: Regular rate and rhythm; No murmurs, rubs or bruits Abdomen: Soft, non tender and non distended. No masses,  hepatosplenomegaly or hernias noted. Normal Bowel sounds Rectal: Deferred   Review of Data  The following data was reviewed at the time of this encounter:  Laboratory Studies      Latest Ref Rng & Units 02/27/2024   12:09 PM 07/30/2023   10:51 AM 09/04/2022   10:30 AM  CBC  WBC 4.0 - 10.5 K/uL 10.0  15.3  11.2   Hemoglobin 12.0 - 15.0 g/dL 87.2  87.2  88.1   Hematocrit 36.0 - 46.0 % 38.5  39.0  35.4   Platelets 150.0 - 400.0 K/uL 490.0  455.0  532.0     Lab Results  Component Value Date   LIPASE 25 12/07/2011      Latest Ref Rng & Units 02/27/2024   12:09 PM 07/30/2023   10:51 AM 09/04/2022   10:30 AM  CMP  Glucose 70 - 99 mg/dL 855  642  846   BUN 6 - 23 mg/dL 17  24  15    Creatinine 0.40 - 1.20 mg/dL 9.34  9.21  9.19   Sodium 135 - 145 mEq/L 136  133  136   Potassium 3.5 - 5.1 mEq/L 3.6  4.1  3.9   Chloride 96 - 112 mEq/L 100  99  99   CO2 19 - 32 mEq/L 25  25  28    Calcium  8.4 - 10.5 mg/dL 9.0  8.8  9.3   Total Protein 6.0 - 8.3 g/dL 7.6  7.0  7.4   Total Bilirubin 0.2 - 1.2 mg/dL 0.5  0.5  0.4   Alkaline Phos 39 - 117 U/L 87  80  79   AST 0 - 37 U/L 13  10  10    ALT 0 - 35 U/L 6  10  5     Lab Results  Component Value Date   TSH 1.41 09/04/2022   Fecal calprotectin  08/2022  943 07/2023  291  Labs 07/2023 Fecal fat normal Fecal pancreatic elastase greater than 800  Imaging Studies  KUB 07/25/2023 Constipation. Diverticulosis.   MRI Abdomen 05/2022 1. Mild degradation secondary to patient body habitus. 2. Multiple bilateral renal lesions, including probable complex cysts as detailed above. Given above limitations, these are considered Bosniak 2 F and warrant follow-up with pre and post-contrast renal protocol CT or MRI at 6-12 months. On that exam, recommend special attention to a hypoenhancing 8 mm lesion for which a tiny neoplasm is a concern. 3.  Possible constipation. 4.  Aortic Atherosclerosis (ICD10-I70.0).  CTAP 07/2021 1. No pulmonary  embolism. 2. Cardiomegaly with extensive coronary artery calcifications. 3. Marked dilatation of the main pulmonary trunk suggesting pulmonary hypertension. 4. Small bilateral pleural effusions and bilateral infiltrates. Aspiration would certainly be a consideration. 5. 3 cm low-attenuation lesion involving the left kidney. This could be due to hemorrhage or enhancement. Pre and postcontrast abdominal CT scan suggested in 3 months to reassess. 6. Stable bilateral renal calculi. 7. No acute abdominal or pelvic findings.  GI Procedures and Studies  EGD 03/2019 Normal esophagus, diffuse moderate inflammation in stomach, 2 gastric papules, normal duodenum -H. pylori gastritis on pathology  Colonoscopy 12/2017 No active inflammation, 3 polyps in DC, AC, ICV (TA' s), 2 medium size lipomas in TC and AC, left-sided diverticula  Colonoscopy 10/2012 Mild diverticulosis of sigmoid colon, IH  EGD 10/2012 Gastric polyps, otherwise normal  Clinical Impression  It is my clinical impression that [name] is a 83 y.o. female with;  Left-sided ulcerative colitis diagnosed 2008 Pancreatic insufficiency dx'd 2020 IBS-D GERD H. pylori gastritis 03/2019  Personal history of tubular adenomas and sessile serrated polyps History of mild normocytic anemia Status post cholecystectomy  [name] returns to the office today with a primary complaint of chronic diarrhea and fecal incontinence.  At prior visits, I explained to her family that she carries multiple diagnoses that could be contributing to diarrhea: Left-sided UC, pancreatic exocrine insufficiency, IBS-D, medications (colchicine), choleretic diarrhea secondary to cholecystectomy.  Previously it was unclear if budesonide  was truly providing any benefit and was quite costly.  Through shared decision making with her family budesonide  was discontinued in spring 2025 and they felt she was generally stable without the medication.  Also discontinued magnesium   oxide.  At her last visit in June 2025 I prescribed Colestid  for possible bile acid malabsorption related to cholecystectomy.  Her son and daughter feel that she was doing quite well until 3 weeks ago when she began having recurrent symptoms of diarrhea and fecal incontinence.  Reviewed that the differential diagnosis could include infectious diarrhea, IBD flare, worsening EPI or bile acid malabsorption.  Noteworthy that KUB for 2025 showed significant stool burden -constipation with overflow encopresis is in the differential diagnosis.  Discussed performing laboratory and stool studies to try to discern which  of these etiologies may be contributing to current symptomatology.  Cianna was previously diagnosed with H. pylori in 2020.  She did not tolerate quadruple therapy.  She was seen by ID and treated with amoxicillin  1 g p.o. twice daily, Levaquin  500 mg daily and a PPI.  A test of cure was not obtained at that time.  She submitted an H. pylori stool antigen in April 2025 that was positive.  She has been treated with Talicia  and repeat H. pylori stool antigen 10/2023 was negative.  Plan  Labs today: CBC, CMP, ESR, CRP Submit stool studies for stool culture, C. difficile, fecal calprotectin KUB to assess stool burden and evaluate for encopresis with overflow Continue Lialda  2.4 g p.o. twice daily and continue holding budesonide  Continue dicyclomine  10 mg p.o. 3 times daily Continue Zenpep  40,000 units 2 caps before meals, 1 cap before snacks for now May continue Imodium and Lomotil  as needed -judicious use of Imodium given history of prolonged QT Continue Lactaid products Continue Colestid  2 g p.o. twice daily Continue pantoprazole  40 mg orally daily Refill provided today of budesonide  9 mg while awaiting results of lab and stool testing  Planned Follow Up 3 months  The patient or caregiver verbalized understanding of the material covered, with no barriers to understanding. All questions were  answered. Patient or caregiver is agreeable with the plan outlined above.    It was a pleasure to see [name].  If you have any questions or concerns regarding this evaluation, do not hesitate to contact me.  [name] Hausen, MD [facility]   I spent total of 30 minutes in both face-to-face (20 minutes interview) and non-face-to-face (10 minutes chart review, care coordination, documentation)  activities, excluding procedures performed, for the visit on the date of this encounter.

## 2024-02-26 NOTE — Telephone Encounter (Addendum)
 Called & spoke with patient's son Elsie. Patient is overdue for an office visit, they preferred to see Dr. Suzann, no APP. Dr. Suzann had a cancellation at 11:10 am tomorrow and patient will come in at that time to discuss current symptoms. Elsie is aware that December appt has been cancelled since we were able to schedule a sooner appt. William verbalized understanding & had no concerns at the end of the call.   Demita, CMA notified of add on appt.

## 2024-02-26 NOTE — Telephone Encounter (Signed)
 Inbound call from patients son elsie requesting a call back from nurse to discuss his mom having a flare over the weekend and still having symptoms. He also wants to discuss refill of medication. Please advise.

## 2024-02-27 ENCOUNTER — Encounter: Payer: Self-pay | Admitting: Pediatrics

## 2024-02-27 ENCOUNTER — Ambulatory Visit
Admission: RE | Admit: 2024-02-27 | Discharge: 2024-02-27 | Disposition: A | Source: Ambulatory Visit | Attending: Pediatrics | Admitting: Pediatrics

## 2024-02-27 ENCOUNTER — Other Ambulatory Visit: Payer: Self-pay | Admitting: Pediatrics

## 2024-02-27 ENCOUNTER — Other Ambulatory Visit

## 2024-02-27 ENCOUNTER — Ambulatory Visit: Admitting: Pediatrics

## 2024-02-27 VITALS — BP 110/68 | HR 70 | Ht 60.0 in | Wt 187.3 lb

## 2024-02-27 DIAGNOSIS — K5190 Ulcerative colitis, unspecified, without complications: Secondary | ICD-10-CM | POA: Diagnosis not present

## 2024-02-27 DIAGNOSIS — K580 Irritable bowel syndrome with diarrhea: Secondary | ICD-10-CM | POA: Diagnosis not present

## 2024-02-27 DIAGNOSIS — K8689 Other specified diseases of pancreas: Secondary | ICD-10-CM

## 2024-02-27 DIAGNOSIS — K5130 Ulcerative (chronic) rectosigmoiditis without complications: Secondary | ICD-10-CM

## 2024-02-27 DIAGNOSIS — K219 Gastro-esophageal reflux disease without esophagitis: Secondary | ICD-10-CM

## 2024-02-27 DIAGNOSIS — Z8619 Personal history of other infectious and parasitic diseases: Secondary | ICD-10-CM

## 2024-02-27 LAB — COMPREHENSIVE METABOLIC PANEL WITH GFR
ALT: 6 U/L (ref 0–35)
AST: 13 U/L (ref 0–37)
Albumin: 4.3 g/dL (ref 3.5–5.2)
Alkaline Phosphatase: 87 U/L (ref 39–117)
BUN: 17 mg/dL (ref 6–23)
CO2: 25 meq/L (ref 19–32)
Calcium: 9 mg/dL (ref 8.4–10.5)
Chloride: 100 meq/L (ref 96–112)
Creatinine, Ser: 0.65 mg/dL (ref 0.40–1.20)
GFR: 82.47 mL/min (ref >60.00)
Glucose, Bld: 144 mg/dL — ABNORMAL HIGH (ref 70–99)
Potassium: 3.6 meq/L (ref 3.5–5.1)
Sodium: 136 meq/L (ref 135–145)
Total Bilirubin: 0.5 mg/dL (ref 0.2–1.2)
Total Protein: 7.6 g/dL (ref 6.0–8.3)

## 2024-02-27 LAB — CBC WITH DIFFERENTIAL/PLATELET
Basophils Absolute: 0.1 K/uL (ref 0.0–0.1)
Basophils Relative: 0.5 % (ref 0.0–3.0)
Eosinophils Absolute: 0.6 K/uL (ref 0.0–0.7)
Eosinophils Relative: 5.7 % — ABNORMAL HIGH (ref 0.0–5.0)
HCT: 38.5 % (ref 36.0–46.0)
Hemoglobin: 12.7 g/dL (ref 12.0–15.0)
Lymphocytes Relative: 16 % (ref 12.0–46.0)
Lymphs Abs: 1.6 K/uL (ref 0.7–4.0)
MCHC: 33.1 g/dL (ref 30.0–36.0)
MCV: 86.1 fl (ref 78.0–100.0)
Monocytes Absolute: 0.7 K/uL (ref 0.1–1.0)
Monocytes Relative: 7 % (ref 3.0–12.0)
Neutro Abs: 7.1 K/uL (ref 1.4–7.7)
Neutrophils Relative %: 70.8 % (ref 43.0–77.0)
Platelets: 490 K/uL — ABNORMAL HIGH (ref 150.0–400.0)
RBC: 4.48 Mil/uL (ref 3.87–5.11)
RDW: 15.4 % (ref 11.5–15.5)
WBC: 10 K/uL (ref 4.0–10.5)

## 2024-02-27 LAB — SEDIMENTATION RATE: Sed Rate: 29 mm/h (ref 0–30)

## 2024-02-27 LAB — C-REACTIVE PROTEIN: CRP: <0.5 mg/dL (ref 0.5–20.0)

## 2024-02-27 MED ORDER — MESALAMINE 1.2 G PO TBEC: 2.4000 g | DELAYED_RELEASE_TABLET | Freq: Two times a day (BID) | ORAL | 3 refills | Status: AC

## 2024-02-27 MED ORDER — DICYCLOMINE HCL 10 MG PO CAPS: 10.0000 mg | ORAL_CAPSULE | Freq: Three times a day (TID) | ORAL | 3 refills | Status: DC | PRN

## 2024-02-27 MED ORDER — BUDESONIDE 3 MG PO CPEP: 9.0000 mg | ORAL_CAPSULE | Freq: Every day | ORAL | 0 refills | Status: DC

## 2024-02-27 NOTE — Patient Instructions (Signed)
 Your provider has requested that you go to the basement level for lab work before leaving today. Press B on the elevator. The lab is located at the first door on the left as you exit the elevator.  Due to recent changes in healthcare laws, you may see the results of your imaging and laboratory studies on MyChart before your provider has had a chance to review them.  We understand that in some cases there may be results that are confusing or concerning to you. Not all laboratory results come back in the same time frame and the provider may be waiting for multiple results in order to interpret others.  Please give us  48 hours in order for your provider to thoroughly review all the results before contacting the office for clarification of your results.   Your provider has requested that you have an abdominal x ray before leaving today. Please go to the basement floor to our Radiology department for the test.  Refills have been sent in for Budesonide  3 mg (take 9 mg daily), Bentyl  10 mg (3 times a day as needed for pain), and Mesalamine  1.2 g (take 2 tablets twice a day).  Follow up in 3 months.  Thank you for entrusting me with your care and for choosing [facility], Dr. Inocente Hausen  _______________________________________________________  If your blood pressure at your visit was 140/90 or greater, please contact your primary care physician to follow up on this.  _______________________________________________________  If you are age 81 or older, your body mass index should be between 23-30. Your Body mass index is 36.58 kg/m. If this is out of the aforementioned range listed, please consider follow up with your Primary Care Provider.  If you are age 28 or younger, your body mass index should be between 19-25. Your Body mass index is 36.58 kg/m. If this is out of the aformentioned range listed, please consider follow up with your Primary Care Provider.    ________________________________________________________  The [facility] providers would like to encourage you to use MYCHART to communicate with providers for non-urgent requests or questions.  Due to long hold times on the telephone, sending your provider a message by [facility] may be a faster and more efficient way to get a response.  Please allow 48 business hours for a response.  Please remember that this is for non-urgent requests.  _______________________________________________________  [facility] is using a team-based approach to care.  Your team is made up of your doctor and two to three APPS. Our APPS (Nurse Practitioners and Physician Assistants) work with your physician to ensure care continuity for you. They are fully qualified to address your health concerns and develop a treatment plan. They communicate directly with your gastroenterologist to care for you. Seeing the Advanced Practice Practitioners on your physician's team can help you by facilitating care more promptly, often allowing for earlier appointments, access to diagnostic testing, procedures, and other specialty referrals.

## 2024-02-28 ENCOUNTER — Other Ambulatory Visit: Payer: Self-pay | Admitting: Pediatrics

## 2024-02-29 ENCOUNTER — Encounter: Payer: Self-pay | Admitting: Pediatrics

## 2024-03-01 ENCOUNTER — Ambulatory Visit: Payer: Self-pay | Admitting: Pediatrics

## 2024-03-02 ENCOUNTER — Other Ambulatory Visit

## 2024-03-02 DIAGNOSIS — K8689 Other specified diseases of pancreas: Secondary | ICD-10-CM | POA: Diagnosis not present

## 2024-03-02 DIAGNOSIS — K580 Irritable bowel syndrome with diarrhea: Secondary | ICD-10-CM

## 2024-03-02 DIAGNOSIS — K5130 Ulcerative (chronic) rectosigmoiditis without complications: Secondary | ICD-10-CM

## 2024-03-03 ENCOUNTER — Ambulatory Visit: Payer: Self-pay | Admitting: Pediatrics

## 2024-03-03 DIAGNOSIS — J9610 Chronic respiratory failure, unspecified whether with hypoxia or hypercapnia: Secondary | ICD-10-CM | POA: Diagnosis not present

## 2024-03-04 LAB — CALPROTECTIN, FECAL: Calprotectin, Fecal: 1660 ug/g — ABNORMAL HIGH (ref 0–120)

## 2024-03-06 ENCOUNTER — Telehealth: Payer: Self-pay

## 2024-03-06 ENCOUNTER — Other Ambulatory Visit: Payer: Self-pay

## 2024-03-06 DIAGNOSIS — R197 Diarrhea, unspecified: Secondary | ICD-10-CM

## 2024-03-06 LAB — STOOL CULTURE: E coli, Shiga toxin Assay: NEGATIVE

## 2024-03-06 NOTE — Telephone Encounter (Signed)
 New order placed & pt notified via mychart.

## 2024-03-06 NOTE — Telephone Encounter (Signed)
-----   Message from Inocente CHRISTELLA Hausen sent at 03/06/2024  4:19 PM EST ----- Regarding: RE: C. difficile test Yes, please ----- Message ----- From: Marc Nyla LABOR, RN Sent: 03/06/2024   4:14 PM EST To: Inocente CHRISTELLA Hausen, MD Subject: RE: C. difficile test                          We usually do the PCR through [facility]. I can do that & let patient know we will need a new sample if that works for you. ----- Message ----- From: Hausen Inocente CHRISTELLA, MD Sent: 03/06/2024   4:07 PM EST To: Nyla LABOR Marc, RN Subject: RE: C. difficile test                          C. Diff has two toxins - toxin A and B - most of the PCRs check for both toxins.  Do we do all the C. Diff texts through [facility] or are some done through [facility]?  Inocente ----- Message ----- From: Marc Nyla LABOR, RN Sent: 03/06/2024   2:59 PM EST To: Inocente CHRISTELLA Hausen, MD Subject: RE: C. difficile test                          I spoke with [facility] & they can add it on but it would need to be for C. Diff Toxin B PCR. If that is okay with you I can call them back & let them know to run it. From my understanding, I thought they were two different test with the toxin so just want to clarify with you first. ----- Message ----- From: Hausen Inocente CHRISTELLA, MD Sent: 03/06/2024   1:56 PM EST To: [facility] Pod [facility] Triage Subject: C. difficile test                              Hi POD B -  I was reviewing Ms. Krontz's lab results today and noted that her C. difficile test had not returned yet.  It appears it was ordered as a C. difficile culture and not as a C. difficile PCR -I overlooked that when I signed the orders.  Can you please contact the lab to see if there is a possibility of running a C. difficile PCR on the specimen or if too much time has elapsed and that is not feasible?  Thanks,  Inocente

## 2024-03-09 ENCOUNTER — Telehealth: Payer: Self-pay | Admitting: Internal Medicine

## 2024-03-09 ENCOUNTER — Ambulatory Visit: Payer: Self-pay

## 2024-03-09 LAB — CLOSTRIDIUM DIFFICILE CULTURE-FECAL

## 2024-03-09 LAB — C.DIFF TOXINB QL PCR: CLOSTRIDIUM DIFFICILE TOXINB,QL REAL TIME PCR: DETECTED — CR

## 2024-03-09 MED ORDER — FIDAXOMICIN 200 MG PO TABS: 200.0000 mg | ORAL_TABLET | Freq: Two times a day (BID) | ORAL | 0 refills | Status: DC

## 2024-03-09 NOTE — Telephone Encounter (Addendum)
 Received a call tonight that her stool study came back positive for C. difficile.  Called her and spoke to her and her son.  Sent in Fidaxomicin 200 mg twice daily x 10 days.  She does have azithromycin as an allergy in her allergy list, but this is only because medication is not effective which I confirmed with her.  They were informed by GI to pick up a new stool sample kit today and provide a new stool sample.  I advised to hold off on that unless Dr. Andy office called and advised them that was necessary.

## 2024-03-09 NOTE — Telephone Encounter (Signed)
 FYI Only or Action Required?: FYI only for provider: called on call provider.  Patient was last seen in primary care on 10/23/2023 by [name], MD.  Called Nurse Triage reporting Critical Lab Results.  Symptoms began n/a.  Interventions attempted: Other: n/a.  Symptoms are: n/a.  Triage Disposition: No disposition on file.  Patient/caregiver understands and will follow disposition?:  Reason for Disposition  [1] Caller is not with the adult (patient) AND [2] probable NON-URGENT symptoms  Answer Assessment - Initial Assessment Questions Reporting C diff PCR detected and C Diff Toxin B detected. This RN called on call provider Dr. Geofm and relayed results.   1. REASON FOR CALL: What is the main reason for your call? or How can I best help you?     Dilcia from [facility] calling to report critical lab  Protocols used: Information Only Call - No Triage-A-AH  Copied from CRM [id number]. Topic: Clinical - Red Word Triage >> Mar 09, 2024  6:00 PM Tysheama G wrote: [facility] that prompted transfer to Nurse Triage: Dilcia from [facility] regarding critical lab results for patient

## 2024-03-10 NOTE — Telephone Encounter (Signed)
 Called and spoke with [name]. He is aware that no new specimen is needed. [name] verbalized understanding.

## 2024-03-17 ENCOUNTER — Ambulatory Visit: Admitting: Adult Health

## 2024-03-17 DIAGNOSIS — J9611 Chronic respiratory failure with hypoxia: Secondary | ICD-10-CM | POA: Diagnosis not present

## 2024-03-17 DIAGNOSIS — N281 Cyst of kidney, acquired: Secondary | ICD-10-CM | POA: Diagnosis not present

## 2024-03-17 DIAGNOSIS — J479 Bronchiectasis, uncomplicated: Secondary | ICD-10-CM | POA: Diagnosis not present

## 2024-03-30 ENCOUNTER — Other Ambulatory Visit: Payer: Self-pay | Admitting: Adult Health

## 2024-03-30 DIAGNOSIS — N2889 Other specified disorders of kidney and ureter: Secondary | ICD-10-CM

## 2024-03-31 ENCOUNTER — Ambulatory Visit: Admitting: Adult Health

## 2024-03-31 MED ORDER — PREDNISONE 10 MG PO TABS: 40.0000 mg | ORAL_TABLET | Freq: Every day | ORAL | 0 refills | Status: DC

## 2024-04-01 DIAGNOSIS — M7542 Impingement syndrome of left shoulder: Secondary | ICD-10-CM | POA: Diagnosis not present

## 2024-04-02 DIAGNOSIS — J9610 Chronic respiratory failure, unspecified whether with hypoxia or hypercapnia: Secondary | ICD-10-CM | POA: Diagnosis not present

## 2024-04-02 DIAGNOSIS — E662 Morbid (severe) obesity with alveolar hypoventilation: Secondary | ICD-10-CM | POA: Diagnosis not present

## 2024-04-06 ENCOUNTER — Telehealth: Payer: Self-pay

## 2024-04-06 ENCOUNTER — Telehealth: Payer: Self-pay | Admitting: Family Medicine

## 2024-04-06 NOTE — Telephone Encounter (Unsigned)
 Copied from CRM [id number]. Topic: Clinical - Medication Refill >> Apr 06, 2024  9:13 AM Donna BRAVO wrote: Medication:  morphine  (MS CONTIN ) 30 MG 12 hr tablet [[id number]] DISCONTINUED  Has the patient contacted their pharmacy? No  (Agent: If no, request that the patient contact the pharmacy for the refill. If patient does not wish to contact the pharmacy document the reason why and proceed with request.) (Agent: If yes, when and what did the pharmacy advise?)  This is the patient's preferred pharmacy:     [facility] [address], [place] - [address] AT [address] [place] [address] Phone: [phone] Fax: [phone]  Is this the correct pharmacy for this prescription? Yes If no, delete pharmacy and type the correct one.   Has the prescription been filled recently? Yes  Is the patient out of the medication? Yes  Has the patient been seen for an appointment in the last year OR does the patient have an upcoming appointment? Yes  Can we respond through MyChart? No  Agent: Please be advised that Rx refills may take up to 3 business days. We ask that you follow-up with your pharmacy.

## 2024-04-06 NOTE — Telephone Encounter (Signed)
 Copied from CRM [id number]. Topic: Clinical - Medication Question >> Apr 06, 2024  9:15 AM Donna BRAVO wrote: Reason for CRM:  Patient requesting to speak Dr. Johnny nurse about morphine  (MS CONTIN ) 30 MG 12 hr tablet [[id number]] DISCONTINUED  Medication refill CRM request has been sent in. Patient insisting to speak with nurse.

## 2024-04-13 ENCOUNTER — Telehealth: Payer: Self-pay | Admitting: Family Medicine

## 2024-04-13 NOTE — Telephone Encounter (Unsigned)
 Copied from CRM #[id number]. Topic: Clinical - Refused Triage >> Apr 13, 2024 11:50 AM Alexandria E wrote: Patient/caller voiced complaints of back pain. Patient has been without her morphine  and still in a great deal of pain. Declined transfer to triage.

## 2024-04-14 NOTE — Telephone Encounter (Signed)
 Pt has been scheduled for a VV for her PMV

## 2024-04-14 NOTE — Telephone Encounter (Signed)
 Pt has VV appointment for her PMV

## 2024-04-15 ENCOUNTER — Encounter: Payer: Self-pay | Admitting: Family Medicine

## 2024-04-15 ENCOUNTER — Telehealth (INDEPENDENT_AMBULATORY_CARE_PROVIDER_SITE_OTHER): Admitting: Family Medicine

## 2024-04-15 DIAGNOSIS — M5441 Lumbago with sciatica, right side: Secondary | ICD-10-CM | POA: Diagnosis not present

## 2024-04-15 DIAGNOSIS — M5442 Lumbago with sciatica, left side: Secondary | ICD-10-CM

## 2024-04-15 DIAGNOSIS — G8929 Other chronic pain: Secondary | ICD-10-CM

## 2024-04-15 MED ORDER — MORPHINE SULFATE ER 30 MG PO TBCR: 30.0000 mg | EXTENDED_RELEASE_TABLET | Freq: Two times a day (BID) | ORAL | 0 refills | Status: DC

## 2024-04-15 NOTE — Progress Notes (Signed)
 Subjective:    Patient ID: [name], female    DOB: [birth year], 83 y.o.   MRN: [id number]  HPI Virtual Visit via Video Note  I connected with the patient on 04/15/2024 at  2:00 PM EST by a video enabled telemedicine application and verified that I am speaking with the correct person using two identifiers.  Location patient: home Location provider:work or home office Persons participating in the virtual visit: patient, provider  I discussed the limitations of evaluation and management by telemedicine and the availability of in person appointments. The patient expressed understanding and agreed to proceed.   HPI: Here for pain management. She is doing well.    ROS: See pertinent positives and negatives per HPI.  Past Medical History:  Diagnosis Date   Adenomatous colon polyp 02/2006   Allergy    Allergy, unspecified not elsewhere classified    Anemia    Anxiety    Arthritis    Asthma    Blood transfusion without reported diagnosis    Bronchiectasis    CAD (coronary artery disease)    BMS to the LAD, 2002; cath 12/07/11 patent LAD stent and mild nonobstructive disease, EF 65%; Medical management   Cataract    removed both eyes   CHF (congestive heart failure) (HCC)    Diastolic   Chronic diastolic CHF (congestive heart failure) (HCC) 11/25/2011   Clotting disorder    in legs per pt    Diabetes mellitus    Diverticulosis of colon    DJD (degenerative joint disease)    Fibromyalgia    GERD (gastroesophageal reflux disease)    H. pylori infection 05/12/2019   Hypercholesterolemia    Hypertension    Microscopic hematuria    Neoplasm of kidney    Obesity    Other diseases of lung, not elsewhere classified    Pinched vertebral nerve    Pulmonary nodule    Negative PET in 2010   Stress incontinence, female    Syncope    Ulcerative colitis, left sided (HCC) 2008   Hx of   UTI (lower urinary tract infection)    Vitamin D  deficiency disease     Past Surgical  History:  Procedure Laterality Date   ABDOMINAL HYSTERECTOMY     CARDIAC CATHETERIZATION  11/2011   CATARACT EXTRACTION, BILATERAL  2020   CHOLECYSTECTOMY     COLONOSCOPY  01/09/2018   per Dr. Aneita, adenomatous polyps, repeat in 3 yrs   CORONARY STENT PLACEMENT  2002   BMS to the LAD   ESOPHAGOGASTRODUODENOSCOPY  12/2008   HAND SURGERY  01/2006   Right Hand Surgery by Dr. Camella   LEFT HEART CATHETERIZATION WITH CORONARY ANGIOGRAM N/A 12/07/2011   Procedure: LEFT HEART CATHETERIZATION WITH CORONARY ANGIOGRAM;  Surgeon: Peter M Jordan, MD;  Location: [facility] CATH LAB;  Service: Cardiovascular;  Laterality: N/A;   RIGHT/LEFT HEART CATH AND CORONARY ANGIOGRAPHY N/A 08/07/2021   Procedure: RIGHT/LEFT HEART CATH AND CORONARY ANGIOGRAPHY;  Surgeon: Cherrie Toribio SAUNDERS, MD;  Location: [facility] INVASIVE CV LAB;  Service: Cardiovascular;  Laterality: N/A;   UPPER GASTROINTESTINAL ENDOSCOPY      Family History  Problem Relation Age of Onset   Pneumonia Father    Heart attack Father    Colon polyps Father    Parkinsonism Mother    Diabetes Brother    Sarcoidosis Brother    Hypertension Sister    Diabetes Sister    Breast cancer Daughter    Colon cancer Neg Hx  Esophageal cancer Neg Hx    Rectal cancer Neg Hx    Stomach cancer Neg Hx     Current Medications[1]  EXAM:  VITALS per patient if applicable:  GENERAL: alert, oriented, appears well and in no acute distress  HEENT: atraumatic, conjunttiva clear, no obvious abnormalities on inspection of external nose and ears  NECK: normal movements of the head and neck  LUNGS: on inspection no signs of respiratory distress, breathing rate appears normal, no obvious gross SOB, gasping or wheezing  CV: no obvious cyanosis  MS: moves all visible extremities without noticeable abnormality  PSYCH/NEURO: pleasant and cooperative, no obvious depression or anxiety, speech and thought processing grossly intact  ASSESSMENT AND PLAN: Pain  management. Indication for chronic opioid: low back pain Medication and dose: MS Contin  30 mg # pills per month: 60 Last UDS date: 08-08-23 Opioid Treatment Agreement signed (Y/N): 10-28-18 Opioid Treatment Agreement last reviewed with patient:  04-15-24 [facility] reviewed this encounter (include red flags): Yes Meds were refilled. Garnette Olmsted, MD  Discussed the following assessment and plan:  No diagnosis found.     I discussed the assessment and treatment plan with the patient. The patient was provided an opportunity to ask questions and all were answered. The patient agreed with the plan and demonstrated an understanding of the instructions.   The patient was advised to call back or seek an in-person evaluation if the symptoms worsen or if the condition fails to improve as anticipated.      Review of Systems     Objective:   Physical Exam        Assessment & Plan:       [1]  Current Outpatient Medications:    acetaminophen  (TYLENOL ) 500 MG tablet, Take 500 mg by mouth every 6 (six) hours as needed for headache (pain). , Disp: , Rfl:    AIRSUPRA 90-80 MCG/ACT AERO, 2 puffs as needed Inhalation prn up to 12 puffs a day; Duration: 30 days, Disp: , Rfl:    albuterol  (PROVENTIL ) (2.5 MG/3ML) 0.083% nebulizer solution, Take 3 mLs (2.5 mg total) by nebulization every 4 (four) hours as needed for wheezing or shortness of breath., Disp: 75 mL, Rfl: 3   albuterol  (VENTOLIN  HFA) 108 (90 BASE) MCG/ACT inhaler, Inhale 2 puffs into the lungs every 4 (four) hours as needed for wheezing or shortness of breath., Disp: 18 each, Rfl: 11   ALPRAZolam  (XANAX ) 0.5 MG tablet, TAKE 1 TABLET BY MOUTH THREE TIMES A DAY AS NEEDED, Disp: 90 tablet, Rfl: 5   amLODipine  (NORVASC ) 10 MG tablet, Take 1 tablet (10 mg total) by mouth daily., Disp: 90 tablet, Rfl: 3   atorvastatin  (LIPITOR) 20 MG tablet, Take 1 tablet (20 mg total) by mouth daily., Disp: 90 tablet, Rfl: 3   azelastine (ASTELIN) 0.1 %  nasal spray, Place 1 spray into both nostrils at bedtime., Disp: , Rfl:    Blood Glucose Monitoring Suppl (ONETOUCH VERIO FLEX SYSTEM) w/Device KIT, 1 each by Does not apply route daily at 12 noon., Disp: 1 kit, Rfl: 0   budesonide  (ENTOCORT EC ) 3 MG 24 hr capsule, Take 3 capsules (9 mg total) by mouth daily. Please take for one more month then discontinue., Disp: 270 capsule, Rfl: 0   budesonide -formoterol  (SYMBICORT) 160-4.5 MCG/ACT inhaler, Inhale 2 puffs into the lungs 2 (two) times daily., Disp: , Rfl:    Cholecalciferol  (VITAMIN D -3) 5000 UNITS TABS, Patient takes 1 tablet by mouth once a week on Mondays., Disp: , Rfl:  ciclopirox  (PENLAC ) 8 % solution, Apply topically at bedtime. Apply over nail. Apply daily over previous coat. Remove weekly with polish remover., Disp: 6.6 mL, Rfl: 11   clopidogrel  (PLAVIX ) 75 MG tablet, Take 1 tablet (75 mg total) by mouth daily., Disp: 90 tablet, Rfl: 3   clotrimazole -betamethasone  (LOTRISONE ) cream, APPLY 1 APPLICATION TOPICALLY TWICE DAILY TO AFFECTED AREAS OF FEET AND ANKLES FOR 4 WEEKS, Disp: 45 g, Rfl: 3   colchicine 0.6 MG tablet, Take 0.6 mg by mouth 2 (two) times daily., Disp: , Rfl:    colestipol  (COLESTID ) 1 g tablet, TAKE 2 TABLETS BY MOUTH 2 TIMES DAILY., Disp: 360 tablet, Rfl: 1   dicyclomine  (BENTYL ) 10 MG capsule, TAKE 1 CAPSULE (10 MG TOTAL) BY MOUTH 3 (THREE) TIMES DAILY AS NEEDED FOR SPASMS., Disp: 270 capsule, Rfl: 2   diphenoxylate -atropine  (LOMOTIL ) 2.5-0.025 MG tablet, Take 1 tablet by mouth in the morning and at bedtime., Disp: 60 tablet, Rfl: 5   FARXIGA  10 MG TABS tablet, TAKE 1 TABLET BY MOUTH EVERY DAY, Disp: 30 tablet, Rfl: 5   fidaxomicin  (DIFICID ) 200 MG TABS tablet, Take 1 tablet (200 mg total) by mouth 2 (two) times daily., Disp: 20 tablet, Rfl: 0   fluticasone  (FLONASE ) 50 MCG/ACT nasal spray, PLACE 2 SPRAYS IN EACH NOSTRIL AT BEDTIME, Disp: 16 g, Rfl: 6   gabapentin  (NEURONTIN ) 300 MG capsule, TAKE 1 CAPSULE BY MOUTH TWICE  A DAY, Disp: 60 capsule, Rfl: 11   glimepiride  (AMARYL ) 1 MG tablet, TAKE 2 TABLETS (2 MG TOTAL) BY MOUTH DAILY WITH BREAKFAST, Disp: 180 tablet, Rfl: 3   HYDROcodone  bit-homatropine (HYCODAN) 5-1.5 MG/5ML syrup, Take 5 mLs by mouth every 4 (four) hours as needed., Disp: 240 mL, Rfl: 0   hydrOXYzine  (ATARAX ) 10 MG tablet, Take 10 mg by mouth every 6 (six) hours as needed., Disp: , Rfl:    isosorbide  mononitrate (IMDUR ) 30 MG 24 hr tablet, Take 1 tablet (30 mg total) by mouth daily., Disp: 90 tablet, Rfl: 3   JANUVIA  100 MG tablet, TAKE 1 TABLET BY MOUTH EVERY DAY, Disp: 90 tablet, Rfl: 3   ketoconazole  (NIZORAL ) 2 % cream, Apply 1 Application topically daily. Apply 1gm to bilateral foot  once daily, Disp: 60 g, Rfl: 2   levocetirizine (XYZAL ) 5 MG tablet, Take 5 mg by mouth every evening., Disp: , Rfl:    levofloxacin  (LEVAQUIN ) 500 MG tablet, Take 1 tablet (500 mg total) by mouth daily., Disp: 10 tablet, Rfl: 0   LIDODERM  5 %, APPLY 1 PATCH TO SKIN FOR 12 HOURS THEN REMOVE AND DISCARD PATCH WITHIN 72 HOURS OR AS DIRECTED, Disp: 30 patch, Rfl: 2   loperamide (IMODIUM) 2 MG capsule, Take 4-6 mg by mouth daily. Taking as needed only, Disp: , Rfl:    losartan  (COZAAR ) 25 MG tablet, Take 1 tablet (25 mg total) by mouth daily., Disp: 90 tablet, Rfl: 3   mesalamine  (LIALDA ) 1.2 g EC tablet, Take 2 tablets (2.4 g total) by mouth 2 (two) times daily., Disp: 360 tablet, Rfl: 3   metoprolol  succinate (TOPROL -XL) 100 MG 24 hr tablet, Take 1 tablet (100 mg total) by mouth daily. TAKE WITH OR IMMEDIATELY FOLLOWING A MEAL., Disp: 90 tablet, Rfl: 3   montelukast  (SINGULAIR ) 10 MG tablet, Take one every morning, Disp: 90 tablet, Rfl: 3   morphine  (MS CONTIN ) 30 MG 12 hr tablet, Take 1 tablet (30 mg total) by mouth every 12 (twelve) hours., Disp: 60 tablet, Rfl: 0   morphine  (MS CONTIN )  30 MG 12 hr tablet, Take 1 tablet (30 mg total) by mouth every 12 (twelve) hours., Disp: 60 tablet, Rfl: 0   nitroGLYCERIN   (NITROSTAT ) 0.4 MG SL tablet, PLACE 1 TABLET UNDER THE TONGUE EVERY 5 MINUTES AS NEEDED FOR CHEST PAIN., Disp: 75 tablet, Rfl: 2   Olopatadine  HCl 0.7 % SOLN, Place 1 drop into both eyes daily as needed (itching/allergies)., Disp: , Rfl:    OneTouch Delica Lancets 33G MISC, 1 each by Does not apply route daily at 12 noon., Disp: 100 each, Rfl: 0   ONETOUCH VERIO test strip, USE TO CHECK BLOOD GLUCOSE ONCE DAILY, Disp: 100 strip, Rfl: 1   Pancrelipase , Lip-Prot-Amyl, (ZENPEP ) 20000-63000 units CPEP, TAKE 2 CAPSULES (40,000 UNITS) WITH MEALS AND 1 CAPSULE (20,000 UNITS) WITH A SNACK. Max daily dose of 8 capsules (160,000 units)., Disp: 200 capsule, Rfl: 3   Pancrelipase , Lip-Prot-Amyl, (ZENPEP ) 40000-126000 units CPEP, Take by mouth. Take 2 tablets by mouth before meals and 1 tablet before snacks, Disp: , Rfl:    pantoprazole  (PROTONIX ) 40 MG tablet, Take 1 tablet (40 mg total) by mouth 2 (two) times daily., Disp: 180 tablet, Rfl: 3   Polyethyl Glycol-Propyl Glycol (SYSTANE OP), Place 1 drop into both eyes daily. For dry eyes, Disp: , Rfl:    potassium chloride  SA (KLOR-CON  M) 20 MEQ tablet, Take 1 tablet (20 mEq total) by mouth 2 (two) times daily., Disp: 180 tablet, Rfl: 3   predniSONE  (DELTASONE ) 10 MG tablet, Take 4 tablets (40 mg total) by mouth daily with breakfast., Disp: 120 tablet, Rfl: 0   promethazine  (PHENERGAN ) 25 MG tablet, TAKE 0.5 TABLETS (12.5 MG TOTAL) BY MOUTH DAILY. AS NEEDED FOR NAUSEA/ VOMITING., Disp: 30 tablet, Rfl: 1   tiZANidine  (ZANAFLEX ) 4 MG tablet, Take 4 mg by mouth 2 (two) times daily., Disp: , Rfl:    torsemide  (DEMADEX ) 20 MG tablet, Take 2 tablets (40 mg total) by mouth daily., Disp: 180 tablet, Rfl: 3   Vitamin D , Ergocalciferol , (DRISDOL) 1.25 MG (50000 UNIT) CAPS capsule, Take 50,000 Units by mouth every 14 (fourteen) days., Disp: , Rfl:   Current Facility-Administered Medications:    ipratropium-albuterol  (DUONEB) 0.5-2.5 (3) MG/3ML nebulizer solution 3 mL, 3 mL,  Nebulization, Once, Nafziger, Darleene, NP

## 2024-04-16 ENCOUNTER — Ambulatory Visit: Admitting: Pediatrics

## 2024-04-28 LAB — OPHTHALMOLOGY REPORT-SCANNED

## 2024-05-05 ENCOUNTER — Telehealth: Payer: Self-pay

## 2024-05-05 MED ORDER — LEVOFLOXACIN 500 MG PO TABS: 500.0000 mg | ORAL_TABLET | Freq: Every day | ORAL | 0 refills | Status: DC

## 2024-05-05 NOTE — Telephone Encounter (Signed)
 I sent in the Levaquin 

## 2024-05-05 NOTE — Telephone Encounter (Signed)
 Copied from CRM [id number]. Topic: Clinical - Medication Question >> May 04, 2024  8:26 AM Burnard DEL wrote: Reason for CRM: Patient son Elsie would like to know if Dr Johnny could prescribe his mom some levofloxacin  (LEVAQUIN ) 500 MG tablet   for her cough and congestion.He stated that Dr Johnny usually prescribed this to his  mom when she get this way with cough and congestion. He would like a return call as well from Nancy.   [id number] - [place],  - [address]  Phone: [phone] Fax: [phone]

## 2024-05-08 ENCOUNTER — Other Ambulatory Visit: Payer: Self-pay

## 2024-05-08 ENCOUNTER — Emergency Department (HOSPITAL_COMMUNITY)

## 2024-05-08 ENCOUNTER — Inpatient Hospital Stay (HOSPITAL_COMMUNITY)
Admission: EM | Admit: 2024-05-08 | Discharge: 2024-05-18 | DRG: 286 | Disposition: A | Discharge status: Home Health | Attending: Internal Medicine | Admitting: Internal Medicine

## 2024-05-08 ENCOUNTER — Encounter (HOSPITAL_COMMUNITY): Payer: Self-pay

## 2024-05-08 DIAGNOSIS — F32A Depression, unspecified: Secondary | ICD-10-CM | POA: Diagnosis present

## 2024-05-08 DIAGNOSIS — R0609 Other forms of dyspnea: Secondary | ICD-10-CM | POA: Diagnosis not present

## 2024-05-08 DIAGNOSIS — I5021 Acute systolic (congestive) heart failure: Principal | ICD-10-CM

## 2024-05-08 DIAGNOSIS — T380X5A Adverse effect of glucocorticoids and synthetic analogues, initial encounter: Secondary | ICD-10-CM | POA: Diagnosis not present

## 2024-05-08 DIAGNOSIS — J9621 Acute and chronic respiratory failure with hypoxia: Secondary | ICD-10-CM | POA: Diagnosis present

## 2024-05-08 DIAGNOSIS — E1165 Type 2 diabetes mellitus with hyperglycemia: Secondary | ICD-10-CM | POA: Diagnosis not present

## 2024-05-08 DIAGNOSIS — Z8249 Family history of ischemic heart disease and other diseases of the circulatory system: Secondary | ICD-10-CM

## 2024-05-08 DIAGNOSIS — M797 Fibromyalgia: Secondary | ICD-10-CM | POA: Diagnosis present

## 2024-05-08 DIAGNOSIS — I272 Pulmonary hypertension, unspecified: Secondary | ICD-10-CM | POA: Diagnosis not present

## 2024-05-08 DIAGNOSIS — K8689 Other specified diseases of pancreas: Secondary | ICD-10-CM | POA: Diagnosis present

## 2024-05-08 DIAGNOSIS — Z79899 Other long term (current) drug therapy: Secondary | ICD-10-CM

## 2024-05-08 DIAGNOSIS — I5030 Unspecified diastolic (congestive) heart failure: Secondary | ICD-10-CM | POA: Diagnosis present

## 2024-05-08 DIAGNOSIS — K5190 Ulcerative colitis, unspecified, without complications: Secondary | ICD-10-CM | POA: Diagnosis present

## 2024-05-08 DIAGNOSIS — R0902 Hypoxemia: Secondary | ICD-10-CM | POA: Diagnosis present

## 2024-05-08 DIAGNOSIS — Z9981 Dependence on supplemental oxygen: Secondary | ICD-10-CM | POA: Diagnosis not present

## 2024-05-08 DIAGNOSIS — Z83719 Family history of colon polyps, unspecified: Secondary | ICD-10-CM

## 2024-05-08 DIAGNOSIS — Z833 Family history of diabetes mellitus: Secondary | ICD-10-CM

## 2024-05-08 DIAGNOSIS — Z1152 Encounter for screening for COVID-19: Secondary | ICD-10-CM

## 2024-05-08 DIAGNOSIS — E7800 Pure hypercholesterolemia, unspecified: Secondary | ICD-10-CM | POA: Diagnosis present

## 2024-05-08 DIAGNOSIS — Z8679 Personal history of other diseases of the circulatory system: Secondary | ICD-10-CM | POA: Diagnosis not present

## 2024-05-08 DIAGNOSIS — I110 Hypertensive heart disease with heart failure: Principal | ICD-10-CM | POA: Diagnosis present

## 2024-05-08 DIAGNOSIS — I2723 Pulmonary hypertension due to lung diseases and hypoxia: Secondary | ICD-10-CM | POA: Diagnosis present

## 2024-05-08 DIAGNOSIS — Z882 Allergy status to sulfonamides status: Secondary | ICD-10-CM

## 2024-05-08 DIAGNOSIS — I2510 Atherosclerotic heart disease of native coronary artery without angina pectoris: Secondary | ICD-10-CM | POA: Diagnosis present

## 2024-05-08 DIAGNOSIS — Z888 Allergy status to other drugs, medicaments and biological substances status: Secondary | ICD-10-CM

## 2024-05-08 DIAGNOSIS — G8929 Other chronic pain: Secondary | ICD-10-CM | POA: Diagnosis present

## 2024-05-08 DIAGNOSIS — G4733 Obstructive sleep apnea (adult) (pediatric): Secondary | ICD-10-CM | POA: Diagnosis present

## 2024-05-08 DIAGNOSIS — I2781 Cor pulmonale (chronic): Secondary | ICD-10-CM | POA: Diagnosis not present

## 2024-05-08 DIAGNOSIS — A419 Sepsis, unspecified organism: Principal | ICD-10-CM

## 2024-05-08 DIAGNOSIS — Z87891 Personal history of nicotine dependence: Secondary | ICD-10-CM

## 2024-05-08 DIAGNOSIS — Z955 Presence of coronary angioplasty implant and graft: Secondary | ICD-10-CM

## 2024-05-08 DIAGNOSIS — I5033 Acute on chronic diastolic (congestive) heart failure: Secondary | ICD-10-CM | POA: Diagnosis present

## 2024-05-08 DIAGNOSIS — Z9049 Acquired absence of other specified parts of digestive tract: Secondary | ICD-10-CM

## 2024-05-08 DIAGNOSIS — Z7902 Long term (current) use of antithrombotics/antiplatelets: Secondary | ICD-10-CM

## 2024-05-08 DIAGNOSIS — Z885 Allergy status to narcotic agent status: Secondary | ICD-10-CM

## 2024-05-08 DIAGNOSIS — J45909 Unspecified asthma, uncomplicated: Secondary | ICD-10-CM | POA: Diagnosis not present

## 2024-05-08 DIAGNOSIS — Z7951 Long term (current) use of inhaled steroids: Secondary | ICD-10-CM | POA: Diagnosis not present

## 2024-05-08 DIAGNOSIS — Z803 Family history of malignant neoplasm of breast: Secondary | ICD-10-CM

## 2024-05-08 DIAGNOSIS — J159 Unspecified bacterial pneumonia: Secondary | ICD-10-CM | POA: Diagnosis present

## 2024-05-08 DIAGNOSIS — B3731 Acute candidiasis of vulva and vagina: Secondary | ICD-10-CM | POA: Diagnosis present

## 2024-05-08 DIAGNOSIS — J9622 Acute and chronic respiratory failure with hypercapnia: Secondary | ICD-10-CM | POA: Diagnosis not present

## 2024-05-08 DIAGNOSIS — J189 Pneumonia, unspecified organism: Secondary | ICD-10-CM

## 2024-05-08 DIAGNOSIS — J470 Bronchiectasis with acute lower respiratory infection: Secondary | ICD-10-CM | POA: Diagnosis present

## 2024-05-08 DIAGNOSIS — Z886 Allergy status to analgesic agent status: Secondary | ICD-10-CM

## 2024-05-08 DIAGNOSIS — Z881 Allergy status to other antibiotic agents status: Secondary | ICD-10-CM

## 2024-05-08 DIAGNOSIS — Z7984 Long term (current) use of oral hypoglycemic drugs: Secondary | ICD-10-CM | POA: Diagnosis not present

## 2024-05-08 DIAGNOSIS — F411 Generalized anxiety disorder: Secondary | ICD-10-CM | POA: Diagnosis present

## 2024-05-08 DIAGNOSIS — J479 Bronchiectasis, uncomplicated: Secondary | ICD-10-CM | POA: Diagnosis not present

## 2024-05-08 LAB — CBC WITH DIFFERENTIAL/PLATELET
Abs Immature Granulocytes: 0.12 K/uL — ABNORMAL HIGH (ref 0.00–0.07)
Basophils Absolute: 0 K/uL (ref 0.0–0.1)
Basophils Relative: 0 %
Eosinophils Absolute: 0.1 K/uL (ref 0.0–0.5)
Eosinophils Relative: 1 %
HCT: 33.9 % — ABNORMAL LOW (ref 36.0–46.0)
Hemoglobin: 10.8 g/dL — ABNORMAL LOW (ref 12.0–15.0)
Immature Granulocytes: 1 %
Lymphocytes Relative: 10 %
Lymphs Abs: 1.3 K/uL (ref 0.7–4.0)
MCH: 28.9 pg (ref 26.0–34.0)
MCHC: 31.9 g/dL (ref 30.0–36.0)
MCV: 90.6 fL (ref 80.0–100.0)
Monocytes Absolute: 1 K/uL (ref 0.1–1.0)
Monocytes Relative: 9 %
Neutro Abs: 9.6 K/uL — ABNORMAL HIGH (ref 1.7–7.7)
Neutrophils Relative %: 79 %
Platelets: 382 K/uL (ref 150–400)
RBC: 3.74 MIL/uL — ABNORMAL LOW (ref 3.87–5.11)
RDW: 17.2 % — ABNORMAL HIGH (ref 11.5–15.5)
WBC: 12.2 K/uL — ABNORMAL HIGH (ref 4.0–10.5)
nRBC: 0 % (ref 0.0–0.2)

## 2024-05-08 LAB — COMPREHENSIVE METABOLIC PANEL WITH GFR
ALT: 7 U/L (ref 0–44)
AST: 22 U/L (ref 15–41)
Albumin: 3.5 g/dL (ref 3.5–5.0)
Alkaline Phosphatase: 78 U/L (ref 38–126)
Anion gap: 11 (ref 5–15)
BUN: 8 mg/dL (ref 8–23)
CO2: 23 mmol/L (ref 22–32)
Calcium: 8.6 mg/dL — ABNORMAL LOW (ref 8.9–10.3)
Chloride: 101 mmol/L (ref 98–111)
Creatinine, Ser: 0.89 mg/dL (ref 0.44–1.00)
GFR, Estimated: >60 mL/min
Glucose, Bld: 256 mg/dL — ABNORMAL HIGH (ref 70–99)
Potassium: 5.2 mmol/L — ABNORMAL HIGH (ref 3.5–5.1)
Sodium: 135 mmol/L (ref 135–145)
Total Bilirubin: 0.5 mg/dL (ref 0.0–1.2)
Total Protein: 6.7 g/dL (ref 6.5–8.1)

## 2024-05-08 LAB — URINALYSIS, W/ REFLEX TO CULTURE (INFECTION SUSPECTED)
Bilirubin Urine: NEGATIVE
Glucose, UA: >=500 mg/dL — AB
Ketones, ur: NEGATIVE mg/dL
Nitrite: NEGATIVE
Protein, ur: NEGATIVE mg/dL
Specific Gravity, Urine: 1.014 (ref 1.005–1.030)
pH: 5 (ref 5.0–8.0)

## 2024-05-08 LAB — RESP PANEL BY RT-PCR (RSV, FLU A&B, COVID)  RVPGX2
Influenza A by PCR: NEGATIVE
Influenza B by PCR: NEGATIVE
Resp Syncytial Virus by PCR: NEGATIVE
SARS Coronavirus 2 by RT PCR: NEGATIVE

## 2024-05-08 LAB — PROTIME-INR
INR: 1.1 (ref 0.8–1.2)
Prothrombin Time: 15 s (ref 11.4–15.2)

## 2024-05-08 LAB — I-STAT CG4 LACTIC ACID, ED: Lactic Acid, Venous: 2 mmol/L (ref 0.5–1.9)

## 2024-05-08 LAB — PRO BRAIN NATRIURETIC PEPTIDE: Pro Brain Natriuretic Peptide: 1166 pg/mL — ABNORMAL HIGH

## 2024-05-08 MED ORDER — MESALAMINE 1.2 G PO TBEC
2.4000 g | DELAYED_RELEASE_TABLET | Freq: Two times a day (BID) | ORAL | Status: DC
  Administered 2024-05-09 – 2024-05-18 (×19): 2.4 g via ORAL
  Filled 2024-05-08 (×21): qty 2

## 2024-05-08 MED ORDER — COLESTIPOL HCL 1 G PO TABS
2.0000 g | ORAL_TABLET | Freq: Two times a day (BID) | ORAL | Status: DC
  Administered 2024-05-09 – 2024-05-18 (×20): 2 g via ORAL
  Filled 2024-05-08 (×21): qty 2

## 2024-05-08 MED ORDER — IPRATROPIUM-ALBUTEROL 0.5-2.5 (3) MG/3ML IN SOLN
3.0000 mL | Freq: Once | RESPIRATORY_TRACT | Status: AC
  Administered 2024-05-08: 3 mL via RESPIRATORY_TRACT
  Filled 2024-05-08: qty 3

## 2024-05-08 MED ORDER — CLOPIDOGREL BISULFATE 75 MG PO TABS
75.0000 mg | ORAL_TABLET | Freq: Every day | ORAL | Status: DC
  Administered 2024-05-09 – 2024-05-18 (×10): 75 mg via ORAL
  Filled 2024-05-08 (×10): qty 1

## 2024-05-08 MED ORDER — METOPROLOL SUCCINATE ER 100 MG PO TB24
100.0000 mg | ORAL_TABLET | Freq: Every day | ORAL | Status: DC
  Administered 2024-05-09 – 2024-05-18 (×10): 100 mg via ORAL
  Filled 2024-05-08: qty 4
  Filled 2024-05-08 (×9): qty 1

## 2024-05-08 MED ORDER — SODIUM CHLORIDE 0.9 % IV SOLN
500.0000 mg | Freq: Once | INTRAVENOUS | Status: DC
  Filled 2024-05-08: qty 5

## 2024-05-08 MED ORDER — ISOSORBIDE MONONITRATE ER 30 MG PO TB24
30.0000 mg | ORAL_TABLET | Freq: Every day | ORAL | Status: DC
  Administered 2024-05-09 – 2024-05-18 (×10): 30 mg via ORAL
  Filled 2024-05-08 (×10): qty 1

## 2024-05-08 MED ORDER — ATORVASTATIN CALCIUM 10 MG PO TABS
20.0000 mg | ORAL_TABLET | Freq: Every day | ORAL | Status: DC
  Administered 2024-05-09 – 2024-05-18 (×10): 20 mg via ORAL
  Filled 2024-05-08 (×10): qty 2

## 2024-05-08 MED ORDER — ENOXAPARIN SODIUM 40 MG/0.4ML IJ SOSY
40.0000 mg | PREFILLED_SYRINGE | Freq: Every day | INTRAMUSCULAR | Status: DC
  Administered 2024-05-09 – 2024-05-18 (×10): 40 mg via SUBCUTANEOUS
  Filled 2024-05-08 (×10): qty 0.4

## 2024-05-08 MED ORDER — LACTATED RINGERS IV BOLUS
500.0000 mL | Freq: Once | INTRAVENOUS | Status: AC
  Administered 2024-05-08: 500 mL via INTRAVENOUS

## 2024-05-08 MED ORDER — MORPHINE SULFATE ER 15 MG PO TBCR: 30.0000 mg | EXTENDED_RELEASE_TABLET | Freq: Two times a day (BID) | ORAL | Status: DC

## 2024-05-08 MED ORDER — HYDROCODONE BIT-HOMATROP MBR 5-1.5 MG/5ML PO SOLN
5.0000 mL | ORAL | Status: DC | PRN
  Administered 2024-05-10: 5 mL via ORAL
  Filled 2024-05-08: qty 5

## 2024-05-08 MED ORDER — ACETAMINOPHEN 325 MG PO TABS
650.0000 mg | ORAL_TABLET | Freq: Four times a day (QID) | ORAL | Status: DC | PRN
  Administered 2024-05-09 – 2024-05-13 (×3): 650 mg via ORAL
  Filled 2024-05-08 (×3): qty 2

## 2024-05-08 MED ORDER — GABAPENTIN 300 MG PO CAPS
300.0000 mg | ORAL_CAPSULE | Freq: Two times a day (BID) | ORAL | Status: DC
  Administered 2024-05-09 – 2024-05-18 (×20): 300 mg via ORAL
  Filled 2024-05-08: qty 1
  Filled 2024-05-08: qty 3
  Filled 2024-05-08 (×18): qty 1

## 2024-05-08 MED ORDER — LACTATED RINGERS IV SOLN: INTRAVENOUS | Status: DC

## 2024-05-08 MED ORDER — SODIUM CHLORIDE 0.9 % IV SOLN
2.0000 g | Freq: Three times a day (TID) | INTRAVENOUS | Status: DC
  Administered 2024-05-09 – 2024-05-11 (×7): 2 g via INTRAVENOUS
  Filled 2024-05-08 (×7): qty 12.5

## 2024-05-08 MED ORDER — SODIUM CHLORIDE 0.9 % IV SOLN: 100.0000 mg | Freq: Once | INTRAVENOUS | Status: DC

## 2024-05-08 MED ORDER — ACETAMINOPHEN 650 MG RE SUPP: 650.0000 mg | Freq: Four times a day (QID) | RECTAL | Status: DC | PRN

## 2024-05-08 MED ORDER — ALPRAZOLAM 0.5 MG PO TABS: 0.5000 mg | ORAL_TABLET | Freq: Three times a day (TID) | ORAL | Status: DC | PRN

## 2024-05-08 MED ORDER — PANTOPRAZOLE SODIUM 40 MG PO TBEC
40.0000 mg | DELAYED_RELEASE_TABLET | Freq: Two times a day (BID) | ORAL | Status: DC
  Administered 2024-05-09 – 2024-05-18 (×20): 40 mg via ORAL
  Filled 2024-05-08 (×20): qty 1

## 2024-05-08 MED ORDER — MORPHINE SULFATE ER 15 MG PO TBCR
30.0000 mg | EXTENDED_RELEASE_TABLET | Freq: Two times a day (BID) | ORAL | Status: DC
  Administered 2024-05-09 – 2024-05-12 (×9): 30 mg via ORAL
  Filled 2024-05-08 (×9): qty 2

## 2024-05-08 MED ORDER — ONDANSETRON HCL 4 MG PO TABS: 4.0000 mg | ORAL_TABLET | Freq: Four times a day (QID) | ORAL | Status: DC | PRN

## 2024-05-08 MED ORDER — AMLODIPINE BESYLATE 10 MG PO TABS
10.0000 mg | ORAL_TABLET | Freq: Every day | ORAL | Status: DC
  Administered 2024-05-09 – 2024-05-18 (×10): 10 mg via ORAL
  Filled 2024-05-08: qty 2
  Filled 2024-05-08 (×9): qty 1

## 2024-05-08 MED ORDER — VANCOMYCIN HCL 1750 MG/350ML IV SOLN
1750.0000 mg | Freq: Once | INTRAVENOUS | Status: AC
  Administered 2024-05-09: 1750 mg via INTRAVENOUS
  Filled 2024-05-08 (×2): qty 350

## 2024-05-08 MED ORDER — LOSARTAN POTASSIUM 25 MG PO TABS
25.0000 mg | ORAL_TABLET | Freq: Every day | ORAL | Status: DC
  Administered 2024-05-09 – 2024-05-18 (×10): 25 mg via ORAL
  Filled 2024-05-08 (×10): qty 1

## 2024-05-08 MED ORDER — COLCHICINE 0.6 MG PO TABS
0.6000 mg | ORAL_TABLET | Freq: Two times a day (BID) | ORAL | Status: DC
  Administered 2024-05-09 – 2024-05-18 (×20): 0.6 mg via ORAL
  Filled 2024-05-08 (×20): qty 1

## 2024-05-08 MED ORDER — VANCOMYCIN HCL 1500 MG/300ML IV SOLN
1500.0000 mg | INTRAVENOUS | Status: DC
  Administered 2024-05-10: 1500 mg via INTRAVENOUS
  Filled 2024-05-08: qty 300

## 2024-05-08 MED ORDER — SODIUM CHLORIDE 0.9 % IV SOLN
2.0000 g | Freq: Once | INTRAVENOUS | Status: AC
  Administered 2024-05-08: 2 g via INTRAVENOUS
  Filled 2024-05-08: qty 20

## 2024-05-08 MED ORDER — LACTATED RINGERS IV BOLUS (SEPSIS)
1000.0000 mL | Freq: Once | INTRAVENOUS | Status: AC
  Administered 2024-05-08: 1000 mL via INTRAVENOUS

## 2024-05-08 MED ORDER — ONDANSETRON HCL 4 MG/2ML IJ SOLN: 4.0000 mg | Freq: Four times a day (QID) | INTRAMUSCULAR | Status: DC | PRN

## 2024-05-08 MED ORDER — LACTATED RINGERS IV BOLUS
1000.0000 mL | Freq: Once | INTRAVENOUS | Status: AC
  Administered 2024-05-08: 1000 mL via INTRAVENOUS

## 2024-05-08 MED ORDER — BUDESONIDE 3 MG PO CPEP
9.0000 mg | ORAL_CAPSULE | Freq: Every day | ORAL | Status: DC
  Administered 2024-05-09 – 2024-05-18 (×10): 9 mg via ORAL
  Filled 2024-05-08 (×10): qty 3

## 2024-05-08 MED ORDER — PANCRELIPASE (LIP-PROT-AMYL) 12000-38000 UNITS PO CPEP
24000.0000 [IU] | ORAL_CAPSULE | Freq: Three times a day (TID) | ORAL | Status: DC
  Administered 2024-05-09 – 2024-05-18 (×26): 24000 [IU] via ORAL
  Filled 2024-05-08 (×29): qty 2

## 2024-05-08 NOTE — H&P (Signed)
 " History and Physical    [name] FMW:[id number] DOB: [birth year] DOA: 05/08/2024  PCP: [name], MD   Chief Complaint: sepsis  HPI: [name] is a 84 y.o. female with medical history significant of CHF, hypertension who presents emergency department with shortness of breath.  Patient is a poor historian however on arrival was found to be satting 65%.  Was placed on supplemental oxygen  with improvement.  She was endorsing a profound cough and found to be febrile.  She was recently seen by primary care doctor and given Levaquin  for presumed pneumonia.  On arrival labs were obtained which showed respiratory viral panel negative, potassium 5.2, glucose 300, WBC 12.2, hemoglobin 10.8, INR 1.1, BNP 1100, lactic acid 2.0.  Patient underwent CT chest which demonstrated pulmonary opacities.  Chest x-ray showed volume overload.  Patient was started on IV antibiotics admitted for further workup.   Review of Systems: Review of Systems  All other systems reviewed and are negative.    As per HPI otherwise 10 point review of systems negative.   Allergies[1]  Past Medical History:  Diagnosis Date   Adenomatous colon polyp 02/2006   Allergy    Allergy, unspecified not elsewhere classified    Anemia    Anxiety    Arthritis    Asthma    Blood transfusion without reported diagnosis    Bronchiectasis    CAD (coronary artery disease)    BMS to the LAD, 2002; cath 12/07/11 patent LAD stent and mild nonobstructive disease, EF 65%; Medical management   Cataract    removed both eyes   CHF (congestive heart failure) (HCC)    Diastolic   Chronic diastolic CHF (congestive heart failure) (HCC) 11/25/2011   Clotting disorder    in legs per pt    Diabetes mellitus    Diverticulosis of colon    DJD (degenerative joint disease)    Fibromyalgia    GERD (gastroesophageal reflux disease)    H. pylori infection 05/12/2019   Hypercholesterolemia    Hypertension    Microscopic hematuria     Neoplasm of kidney    Obesity    Other diseases of lung, not elsewhere classified    Pinched vertebral nerve    Pulmonary nodule    Negative PET in 2010   Stress incontinence, female    Syncope    Ulcerative colitis, left sided (HCC) 2008   Hx of   UTI (lower urinary tract infection)    Vitamin D  deficiency disease     Past Surgical History:  Procedure Laterality Date   ABDOMINAL HYSTERECTOMY     CARDIAC CATHETERIZATION  11/2011   CATARACT EXTRACTION, BILATERAL  2020   CHOLECYSTECTOMY     COLONOSCOPY  01/09/2018   per Dr. Aneita, adenomatous polyps, repeat in 3 yrs   CORONARY STENT PLACEMENT  2002   BMS to the LAD   ESOPHAGOGASTRODUODENOSCOPY  12/2008   HAND SURGERY  01/2006   Right Hand Surgery by Dr. Camella   LEFT HEART CATHETERIZATION WITH CORONARY ANGIOGRAM N/A 12/07/2011   Procedure: LEFT HEART CATHETERIZATION WITH CORONARY ANGIOGRAM;  Surgeon: Peter M Jordan, MD;  Location: [facility] CATH LAB;  Service: Cardiovascular;  Laterality: N/A;   RIGHT/LEFT HEART CATH AND CORONARY ANGIOGRAPHY N/A 08/07/2021   Procedure: RIGHT/LEFT HEART CATH AND CORONARY ANGIOGRAPHY;  Surgeon: Cherrie Toribio SAUNDERS, MD;  Location: [facility] INVASIVE CV LAB;  Service: Cardiovascular;  Laterality: N/A;   UPPER GASTROINTESTINAL ENDOSCOPY       reports that she quit smoking  about 51 years ago. Her smoking use included cigarettes. She started smoking about 66 years ago. She has a 30 pack-year smoking history. She has been exposed to tobacco smoke. She has never used smokeless tobacco. She reports that she does not currently use alcohol . She reports that she does not use drugs.  Family History  Problem Relation Age of Onset   Pneumonia Father    Heart attack Father    Colon polyps Father    Parkinsonism Mother    Diabetes Brother    Sarcoidosis Brother    Hypertension Sister    Diabetes Sister    Breast cancer Daughter    Colon cancer Neg Hx    Esophageal cancer Neg Hx    Rectal cancer Neg Hx    Stomach  cancer Neg Hx     Prior to Admission medications  Medication Sig Start Date End Date Taking? Authorizing Provider  acetaminophen  (TYLENOL ) 500 MG tablet Take 500 mg by mouth every 6 (six) hours as needed for headache (pain).     [provider]  AIRSUPRA 90-80 MCG/ACT AERO 2 puffs as needed Inhalation prn up to 12 puffs a day; Duration: 30 days 09/30/23   [provider]  albuterol  (PROVENTIL ) (2.5 MG/3ML) 0.083% nebulizer solution Take 3 mLs (2.5 mg total) by nebulization every 4 (four) hours as needed for wheezing or shortness of breath. 02/12/17   [name], MD  albuterol  (VENTOLIN  HFA) 108 (90 BASE) MCG/ACT inhaler Inhale 2 puffs into the lungs every 4 (four) hours as needed for wheezing or shortness of breath. 05/25/14   [name], MD  ALPRAZolam  (XANAX ) 0.5 MG tablet TAKE 1 TABLET BY MOUTH THREE TIMES A DAY AS NEEDED 02/19/24   [name], MD  amLODipine  (NORVASC ) 10 MG tablet Take 1 tablet (10 mg total) by mouth daily. 12/17/23   Parthenia Olivia HERO, PA-C  atorvastatin  (LIPITOR) 20 MG tablet Take 1 tablet (20 mg total) by mouth daily. 12/17/23   Parthenia Olivia HERO, PA-C  azelastine (ASTELIN) 0.1 % nasal spray Place 1 spray into both nostrils at bedtime. 12/08/16   [provider]  Blood Glucose Monitoring Suppl (ONETOUCH VERIO FLEX SYSTEM) w/Device KIT 1 each by Does not apply route daily at 12 noon. 07/14/20   [name], MD  budesonide  (ENTOCORT EC ) 3 MG 24 hr capsule Take 3 capsules (9 mg total) by mouth daily. Please take for one more month then discontinue. 02/27/24   Suzann Inocente HERO, MD  budesonide -formoterol  (SYMBICORT) 160-4.5 MCG/ACT inhaler Inhale 2 puffs into the lungs 2 (two) times daily.    [provider]  Cholecalciferol  (VITAMIN D -3) 5000 UNITS TABS Patient takes 1 tablet by mouth once a week on Mondays.    [provider]  ciclopirox  (PENLAC ) 8 % solution Apply topically at bedtime. Apply over nail. Apply daily over previous  coat. Remove weekly with polish remover. 06/25/23   McCaughan, Dia D, DPM  clopidogrel  (PLAVIX ) 75 MG tablet Take 1 tablet (75 mg total) by mouth daily. 12/17/23   Parthenia Olivia HERO, PA-C  clotrimazole -betamethasone  (LOTRISONE ) cream APPLY 1 APPLICATION TOPICALLY TWICE DAILY TO AFFECTED AREAS OF FEET AND ANKLES FOR 4 WEEKS 03/25/23   McCaughan, Dia D, DPM  colchicine  0.6 MG tablet Take 0.6 mg by mouth 2 (two) times daily. 04/06/23   [provider]  colestipol  (COLESTID ) 1 g tablet TAKE 2 TABLETS BY MOUTH 2 TIMES DAILY. 12/31/23   McGreal, Inocente HERO, MD  dicyclomine  (BENTYL ) 10  MG capsule TAKE 1 CAPSULE (10 MG TOTAL) BY MOUTH 3 (THREE) TIMES DAILY AS NEEDED FOR SPASMS. 03/02/24   Suzann Inocente HERO, MD  diphenoxylate -atropine  (LOMOTIL ) 2.5-0.025 MG tablet Take 1 tablet by mouth in the morning and at bedtime. 12/19/23   Suzann Inocente HERO, MD  FARXIGA  10 MG TABS tablet TAKE 1 TABLET BY MOUTH EVERY DAY 11/11/23   [name], MD  fidaxomicin  (DIFICID ) 200 MG TABS tablet Take 1 tablet (200 mg total) by mouth 2 (two) times daily. 03/09/24   Geofm Glade PARAS, MD  fluticasone  (FLONASE ) 50 MCG/ACT nasal spray PLACE 2 SPRAYS IN EACH NOSTRIL AT BEDTIME 07/26/15   [name], MD  gabapentin  (NEURONTIN ) 300 MG capsule TAKE 1 CAPSULE BY MOUTH TWICE A DAY 11/27/23   [name], MD  glimepiride  (AMARYL ) 1 MG tablet TAKE 2 TABLETS (2 MG TOTAL) BY MOUTH DAILY WITH BREAKFAST 12/16/23   [name], MD  HYDROcodone  bit-homatropine (HYCODAN) 5-1.5 MG/5ML syrup Take 5 mLs by mouth every 4 (four) hours as needed. 01/09/23   [name], MD  hydrOXYzine  (ATARAX ) 10 MG tablet Take 10 mg by mouth every 6 (six) hours as needed.    [provider]  isosorbide  mononitrate (IMDUR ) 30 MG 24 hr tablet Take 1 tablet (30 mg total) by mouth daily. 12/17/23 04/15/24  Parthenia Olivia HERO, PA-C  JANUVIA  100 MG tablet TAKE 1 TABLET BY MOUTH EVERY DAY 05/02/23   [name], MD  ketoconazole  (NIZORAL ) 2 % cream Apply 1  Application topically daily. Apply 1gm to bilateral foot  once daily 06/25/23   McCaughan, Dia D, DPM  levocetirizine (XYZAL ) 5 MG tablet Take 5 mg by mouth every evening.    [provider]  levofloxacin  (LEVAQUIN ) 500 MG tablet Take 1 tablet (500 mg total) by mouth daily. 05/05/24   [name], MD  LIDODERM  5 % APPLY 1 PATCH TO SKIN FOR 12 HOURS THEN REMOVE AND DISCARD PATCH WITHIN 72 HOURS OR AS DIRECTED 03/10/13   Christi Glendia HERO, MD  loperamide (IMODIUM) 2 MG capsule Take 4-6 mg by mouth daily. Taking as needed only    [provider]  losartan  (COZAAR ) 25 MG tablet Take 1 tablet (25 mg total) by mouth daily. 12/17/23   Parthenia Olivia HERO, PA-C  mesalamine  (LIALDA ) 1.2 g EC tablet Take 2 tablets (2.4 g total) by mouth 2 (two) times daily. 02/27/24   Suzann Inocente HERO, MD  metoprolol  succinate (TOPROL -XL) 100 MG 24 hr tablet Take 1 tablet (100 mg total) by mouth daily. TAKE WITH OR IMMEDIATELY FOLLOWING A MEAL. 12/17/23   Parthenia Olivia HERO, PA-C  montelukast  (SINGULAIR ) 10 MG tablet Take one every morning 10/28/18   [name], MD  morphine  (MS CONTIN ) 30 MG 12 hr tablet Take 1 tablet (30 mg total) by mouth every 12 (twelve) hours. 04/15/24   [name], MD  morphine  (MS CONTIN ) 30 MG 12 hr tablet Take 1 tablet (30 mg total) by mouth every 12 (twelve) hours. 04/15/24   [name], MD  morphine  (MS CONTIN ) 30 MG 12 hr tablet Take 1 tablet (30 mg total) by mouth every 12 (twelve) hours. 04/15/24   [name], MD  nitroGLYCERIN  (NITROSTAT ) 0.4 MG SL tablet PLACE 1 TABLET UNDER THE TONGUE EVERY 5 MINUTES AS NEEDED FOR CHEST PAIN. 12/27/22   Jeffrie Oneil BROCKS, MD  Olopatadine  HCl 0.7 % SOLN Place 1 drop into both eyes daily as needed (itching/allergies).  [provider]  OneTouch Delica Lancets 33G MISC 1 each by Does not apply route daily at 12 noon. 07/14/20   [name], MD  [facility] VERIO test strip USE TO CHECK BLOOD GLUCOSE ONCE DAILY 02/07/24   [name], MD  Pancrelipase , Lip-Prot-Amyl, (ZENPEP ) 20000-63000 units CPEP TAKE 2 CAPSULES (40,000 UNITS) WITH MEALS AND 1 CAPSULE (20,000 UNITS) WITH A SNACK. Max daily dose of 8 capsules (160,000 units). 02/19/24   Suzann Inocente HERO, MD  Pancrelipase , Lip-Prot-Amyl, (ZENPEP ) 40000-126000 units CPEP Take by mouth. Take 2 tablets by mouth before meals and 1 tablet before snacks    [provider]  pantoprazole  (PROTONIX ) 40 MG tablet Take 1 tablet (40 mg total) by mouth 2 (two) times daily. 02/19/24   McGreal, Inocente HERO, MD  Polyethyl Glycol-Propyl Glycol (SYSTANE OP) Place 1 drop into both eyes daily. For dry eyes    [provider]  potassium chloride  SA (KLOR-CON  M) 20 MEQ tablet Take 1 tablet (20 mEq total) by mouth 2 (two) times daily. 12/17/23   Parthenia Olivia HERO, PA-C  predniSONE  (DELTASONE ) 10 MG tablet Take 4 tablets (40 mg total) by mouth daily with breakfast. 03/31/24   McGreal, Inocente HERO, MD  promethazine  (PHENERGAN ) 25 MG tablet TAKE 0.5 TABLETS (12.5 MG TOTAL) BY MOUTH DAILY. AS NEEDED FOR NAUSEA/ VOMITING. 02/03/24   McGreal, Inocente HERO, MD  tiZANidine  (ZANAFLEX ) 4 MG tablet Take 4 mg by mouth 2 (two) times daily. 03/17/23   [provider]  torsemide  (DEMADEX ) 20 MG tablet Take 2 tablets (40 mg total) by mouth daily. 12/17/23   Parthenia Olivia HERO, PA-C  Vitamin D , Ergocalciferol , (DRISDOL) 1.25 MG (50000 UNIT) CAPS capsule Take 50,000 Units by mouth every 14 (fourteen) days. 02/15/23   [provider]    Physical Exam: Vitals:   05/08/24 2133 05/08/24 2140 05/08/24 2201 05/08/24 2230  BP: 100/75 114/74 (!) 143/107 113/74  Pulse: 84 86 86 85  Resp: (!) 25 19 (!) 34 (!) 25  Temp:      TempSrc:      SpO2: 95% 93% 92% 93%   Physical Exam Constitutional:      Appearance: She is normal weight.  HENT:     Head: Normocephalic.     Mouth/Throat:     Mouth: Mucous membranes are moist.     Pharynx: Oropharynx is clear.  Eyes:     Pupils: Pupils are equal, round, and  reactive to light.  Cardiovascular:     Rate and Rhythm: Normal rate and regular rhythm.  Pulmonary:     Effort: Pulmonary effort is normal.     Breath sounds: Normal breath sounds.  Abdominal:     Palpations: Abdomen is soft.  Musculoskeletal:        General: Normal range of motion.     Cervical back: Normal range of motion.  Skin:    General: Skin is warm.     Capillary Refill: Capillary refill takes less than 2 seconds.  Neurological:     General: No focal deficit present.     Mental Status: She is alert.  Psychiatric:        Mood and Affect: Mood normal.        Behavior: Behavior normal.       Labs on Admission: I have personally reviewed the patients's labs and imaging studies.  Assessment/Plan Principal Problem:   Community acquired bacterial pneumonia   # Acute hypoxic respiratory failure most likely secondary to bacterial commune  acquired pneumonia # Sepsis secondary to bacterial commune acquired pneumonia - Opacities on chest imaging - Febrile and short of breath - Cough - Elevated BNP  Plan: Patient has numerous allergies to azithromycin  and doxycycline  and failed outpatient Levaquin  Start vancomycin  and cefepime  Continue IV hydration  # Generalized anxiety-continue Xanax   # Hypertension-continue amlodipine , Imdur , losartan , metoprolol   # Pancreatic insufficiency-continue Creon   # Chronic pain-continue gabapentin   # CAD-continue Plavix   # Ulcerative colitis-continue Lialda , budesonide , colestid   Admission status: Inpatient Progressive  Certification: The appropriate patient status for this patient is INPATIENT. Inpatient status is judged to be reasonable and necessary in order to provide the required intensity of service to ensure the patient's safety. [name] presenting symptoms, physical exam findings, and initial radiographic and laboratory data in the context of their chronic comorbidities is felt to place them at high risk for further  clinical deterioration. Furthermore, it is not anticipated that the patient will be medically stable for discharge from the hospital within 2 midnights of admission.   * I certify that at the point of admission it is my clinical judgment that the patient will require inpatient hospital care spanning beyond 2 midnights from the point of admission due to high intensity of service, high risk for further deterioration and high frequency of surveillance required.DEWAINE Lamar Dess MD [facility] If 7PM-7AM, please contact night-coverage [email or web address]  05/08/2024, 11:06 PM        [1]  Allergies Allergen Reactions   Doxycycline  Other (See Comments)    Unsteady gait   Aspirin  Nausea And Vomiting   Azithromycin  Other (See Comments)    Unknown reaction per family member messed her up   Caffeine Nausea And Vomiting   Ciprofloxacin Nausea And Vomiting    Tolerates levaquin    Codeine Nausea And Vomiting   Oxycodone  Other (See Comments)    Pt stated, upsets my stomach (11/19/17)   Sulfamethizole Other (See Comments)    Unknown reaction   Tramadol Hcl Other (See Comments)    REACTION: pt states spaced-out   "

## 2024-05-08 NOTE — ED Provider Notes (Signed)
 " [place] EMERGENCY DEPARTMENT AT [facility] Provider Note   CSN: [id number] Arrival date & time: 05/08/24  8057     Patient presents with: Shortness of Breath   [name] is a 84 y.o. female.   84 year old female with past medical history of CHF and hypertension presenting to the emergency department today with shortness of breath.  Medics were called due to shortness of breath with a call placed by the patient's family.  She somewhat of a poor historian.  She is normally not on oxygen .  When medics arrived her pulse ox was 65% on room air.  The patient is placed on CPAP.  She was up to 100% on arrival.  Was transition to a nonrebreather with sustained saturations there.  They report that she has had a cough over the past few days.  She was found to be febrile here.  She is unable to provide much further history here.   Shortness of Breath      Prior to Admission medications  Medication Sig Start Date End Date Taking? Authorizing Provider  acetaminophen  (TYLENOL ) 500 MG tablet Take 500 mg by mouth every 6 (six) hours as needed for headache (pain).     [provider]  AIRSUPRA 90-80 MCG/ACT AERO 2 puffs as needed Inhalation prn up to 12 puffs a day; Duration: 30 days 09/30/23   [provider]  albuterol  (PROVENTIL ) (2.5 MG/3ML) 0.083% nebulizer solution Take 3 mLs (2.5 mg total) by nebulization every 4 (four) hours as needed for wheezing or shortness of breath. 02/12/17   [name], MD  albuterol  (VENTOLIN  HFA) 108 (90 BASE) MCG/ACT inhaler Inhale 2 puffs into the lungs every 4 (four) hours as needed for wheezing or shortness of breath. 05/25/14   [name], MD  ALPRAZolam  (XANAX ) 0.5 MG tablet TAKE 1 TABLET BY MOUTH THREE TIMES A DAY AS NEEDED 02/19/24   [name], MD  amLODipine  (NORVASC ) 10 MG tablet Take 1 tablet (10 mg total) by mouth daily. 12/17/23   Parthenia Olivia HERO, PA-C  atorvastatin  (LIPITOR) 20 MG tablet Take 1 tablet (20 mg  total) by mouth daily. 12/17/23   Parthenia Olivia HERO, PA-C  azelastine (ASTELIN) 0.1 % nasal spray Place 1 spray into both nostrils at bedtime. 12/08/16   [provider]  Blood Glucose Monitoring Suppl (ONETOUCH VERIO FLEX SYSTEM) w/Device KIT 1 each by Does not apply route daily at 12 noon. 07/14/20   [name], MD  budesonide  (ENTOCORT EC ) 3 MG 24 hr capsule Take 3 capsules (9 mg total) by mouth daily. Please take for one more month then discontinue. 02/27/24   Suzann Inocente HERO, MD  budesonide -formoterol  (SYMBICORT) 160-4.5 MCG/ACT inhaler Inhale 2 puffs into the lungs 2 (two) times daily.    [provider]  Cholecalciferol  (VITAMIN D -3) 5000 UNITS TABS Patient takes 1 tablet by mouth once a week on Mondays.    [provider]  ciclopirox  (PENLAC ) 8 % solution Apply topically at bedtime. Apply over nail. Apply daily over previous coat. Remove weekly with polish remover. 06/25/23   McCaughan, Dia D, DPM  clopidogrel  (PLAVIX ) 75 MG tablet Take 1 tablet (75 mg total) by mouth daily. 12/17/23   Parthenia Olivia HERO, PA-C  clotrimazole -betamethasone  (LOTRISONE ) cream APPLY 1 APPLICATION TOPICALLY TWICE DAILY TO AFFECTED AREAS OF FEET AND ANKLES FOR 4 WEEKS 03/25/23   McCaughan, Dia D, DPM  colchicine  0.6 MG tablet Take 0.6 mg by mouth 2 (two) times daily.  04/06/23   [provider]  colestipol  (COLESTID ) 1 g tablet TAKE 2 TABLETS BY MOUTH 2 TIMES DAILY. 12/31/23   McGreal, Inocente HERO, MD  dicyclomine  (BENTYL ) 10 MG capsule TAKE 1 CAPSULE (10 MG TOTAL) BY MOUTH 3 (THREE) TIMES DAILY AS NEEDED FOR SPASMS. 03/02/24   Suzann Inocente HERO, MD  diphenoxylate -atropine  (LOMOTIL ) 2.5-0.025 MG tablet Take 1 tablet by mouth in the morning and at bedtime. 12/19/23   Suzann Inocente HERO, MD  FARXIGA  10 MG TABS tablet TAKE 1 TABLET BY MOUTH EVERY DAY 11/11/23   [name], MD  fidaxomicin  (DIFICID ) 200 MG TABS tablet Take 1 tablet (200 mg total) by mouth 2 (two) times daily. 03/09/24   Geofm Glade PARAS, MD  fluticasone  (FLONASE ) 50 MCG/ACT nasal spray PLACE 2 SPRAYS IN EACH NOSTRIL AT BEDTIME 07/26/15   [name], MD  gabapentin  (NEURONTIN ) 300 MG capsule TAKE 1 CAPSULE BY MOUTH TWICE A DAY 11/27/23   [name], MD  glimepiride  (AMARYL ) 1 MG tablet TAKE 2 TABLETS (2 MG TOTAL) BY MOUTH DAILY WITH BREAKFAST 12/16/23   [name], MD  HYDROcodone  bit-homatropine (HYCODAN) 5-1.5 MG/5ML syrup Take 5 mLs by mouth every 4 (four) hours as needed. 01/09/23   [name], MD  hydrOXYzine  (ATARAX ) 10 MG tablet Take 10 mg by mouth every 6 (six) hours as needed.    [provider]  isosorbide  mononitrate (IMDUR ) 30 MG 24 hr tablet Take 1 tablet (30 mg total) by mouth daily. 12/17/23 04/15/24  Parthenia Olivia HERO, PA-C  JANUVIA  100 MG tablet TAKE 1 TABLET BY MOUTH EVERY DAY 05/02/23   [name], MD  ketoconazole  (NIZORAL ) 2 % cream Apply 1 Application topically daily. Apply 1gm to bilateral foot  once daily 06/25/23   McCaughan, Dia D, DPM  levocetirizine (XYZAL ) 5 MG tablet Take 5 mg by mouth every evening.    [provider]  levofloxacin  (LEVAQUIN ) 500 MG tablet Take 1 tablet (500 mg total) by mouth daily. 05/05/24   [name], MD  LIDODERM  5 % APPLY 1 PATCH TO SKIN FOR 12 HOURS THEN REMOVE AND DISCARD PATCH WITHIN 72 HOURS OR AS DIRECTED 03/10/13   Christi Glendia HERO, MD  loperamide (IMODIUM) 2 MG capsule Take 4-6 mg by mouth daily. Taking as needed only    [provider]  losartan  (COZAAR ) 25 MG tablet Take 1 tablet (25 mg total) by mouth daily. 12/17/23   Parthenia Olivia HERO, PA-C  mesalamine  (LIALDA ) 1.2 g EC tablet Take 2 tablets (2.4 g total) by mouth 2 (two) times daily. 02/27/24   Suzann Inocente HERO, MD  metoprolol  succinate (TOPROL -XL) 100 MG 24 hr tablet Take 1 tablet (100 mg total) by mouth daily. TAKE WITH OR IMMEDIATELY FOLLOWING A MEAL. 12/17/23   Parthenia Olivia HERO, PA-C  montelukast  (SINGULAIR ) 10 MG tablet Take one every morning 10/28/18   [name], MD   morphine  (MS CONTIN ) 30 MG 12 hr tablet Take 1 tablet (30 mg total) by mouth every 12 (twelve) hours. 04/15/24   [name], MD  morphine  (MS CONTIN ) 30 MG 12 hr tablet Take 1 tablet (30 mg total) by mouth every 12 (twelve) hours. 04/15/24   [name], MD  morphine  (MS CONTIN ) 30 MG 12 hr tablet Take 1 tablet (30 mg total) by mouth every 12 (twelve) hours. 04/15/24   [name], MD  nitroGLYCERIN  (NITROSTAT ) 0.4 MG SL tablet PLACE 1 TABLET UNDER THE TONGUE  EVERY 5 MINUTES AS NEEDED FOR CHEST PAIN. 12/27/22   Jeffrie Oneil BROCKS, MD  Olopatadine  HCl 0.7 % SOLN Place 1 drop into both eyes daily as needed (itching/allergies).    [provider]  OneTouch Delica Lancets 33G MISC 1 each by Does not apply route daily at 12 noon. 07/14/20   [name], MD  [employer] VERIO test strip USE TO CHECK BLOOD GLUCOSE ONCE DAILY 02/07/24   [name], MD  Pancrelipase , Lip-Prot-Amyl, (ZENPEP ) 20000-63000 units CPEP TAKE 2 CAPSULES (40,000 UNITS) WITH MEALS AND 1 CAPSULE (20,000 UNITS) WITH A SNACK. Max daily dose of 8 capsules (160,000 units). 02/19/24   Suzann Inocente HERO, MD  Pancrelipase , Lip-Prot-Amyl, (ZENPEP ) 40000-126000 units CPEP Take by mouth. Take 2 tablets by mouth before meals and 1 tablet before snacks    [provider]  pantoprazole  (PROTONIX ) 40 MG tablet Take 1 tablet (40 mg total) by mouth 2 (two) times daily. 02/19/24   McGreal, Inocente HERO, MD  Polyethyl Glycol-Propyl Glycol (SYSTANE OP) Place 1 drop into both eyes daily. For dry eyes    [provider]  potassium chloride  SA (KLOR-CON  M) 20 MEQ tablet Take 1 tablet (20 mEq total) by mouth 2 (two) times daily. 12/17/23   Parthenia Olivia HERO, PA-C  predniSONE  (DELTASONE ) 10 MG tablet Take 4 tablets (40 mg total) by mouth daily with breakfast. 03/31/24   McGreal, Inocente HERO, MD  promethazine  (PHENERGAN ) 25 MG tablet TAKE 0.5 TABLETS (12.5 MG TOTAL) BY MOUTH DAILY. AS NEEDED FOR NAUSEA/ VOMITING. 02/03/24   McGreal, Inocente HERO,  MD  tiZANidine  (ZANAFLEX ) 4 MG tablet Take 4 mg by mouth 2 (two) times daily. 03/17/23   [provider]  torsemide  (DEMADEX ) 20 MG tablet Take 2 tablets (40 mg total) by mouth daily. 12/17/23   Parthenia Olivia HERO, PA-C  Vitamin D , Ergocalciferol , (DRISDOL) 1.25 MG (50000 UNIT) CAPS capsule Take 50,000 Units by mouth every 14 (fourteen) days. 02/15/23   [provider]    Allergies: Doxycycline , Aspirin , Azithromycin , Caffeine, Ciprofloxacin, Codeine, Oxycodone , Sulfamethizole, and Tramadol hcl    Review of Systems  Reason unable to perform ROS: Poor historian.  Respiratory:  Positive for shortness of breath.     Updated Vital Signs BP (!) 143/107   Pulse 86   Temp (!) 102.9 F (39.4 C) (Axillary)   Resp (!) 34   LMP  (LMP Unknown)   SpO2 92%   Physical Exam Vitals and nursing note reviewed.   Gen: Chronically ill-appearing, no significant respiratory distress noted although mild conversational dyspnea noted Eyes: PERRL, EOMI HEENT: no oropharyngeal swelling Neck: trachea midline Resp: Diminished at bilateral lung bases, exam difficult due to body habitus Card: RRR, no murmurs, rubs, or gallops Abd: nontender, nondistended Extremities: no calf tenderness, bilateral edema noted that is nonpitting, nontender Neuro: No focal deficits Vascular: 2+ radial pulses bilaterally, 2+ DP pulses bilaterally Skin: no rashes Psyc: acting appropriately   (all labs ordered are listed, but only abnormal results are displayed) Labs Reviewed  COMPREHENSIVE METABOLIC PANEL WITH GFR - Abnormal; Notable for the following components:      Result Value   Potassium 5.2 (*)    Glucose, Bld 256 (*)    Calcium  8.6 (*)    All other components within normal limits  CBC WITH DIFFERENTIAL/PLATELET - Abnormal; Notable for the following components:   WBC 12.2 (*)    RBC 3.74 (*)    Hemoglobin 10.8 (*)    HCT 33.9 (*)  RDW 17.2 (*)    Neutro Abs 9.6 (*)    Abs Immature Granulocytes  0.12 (*)    All other components within normal limits  I-STAT CG4 LACTIC ACID, ED - Abnormal; Notable for the following components:   Lactic Acid, Venous 2.0 (*)    All other components within normal limits  RESP PANEL BY RT-PCR (RSV, FLU A&B, COVID)  RVPGX2  CULTURE, BLOOD (ROUTINE X 2)  CULTURE, BLOOD (ROUTINE X 2)  PROTIME-INR  URINALYSIS, W/ REFLEX TO CULTURE (INFECTION SUSPECTED)  PRO BRAIN NATRIURETIC PEPTIDE  I-STAT CG4 LACTIC ACID, ED    EKG: EKG Interpretation Date/Time:  Friday May 08 2024 19:48:18 EST Ventricular Rate:  92 PR Interval:  160 QRS Duration:  114 QT Interval:  365 QTC Calculation: 452 R Axis:   89  Text Interpretation: Sinus rhythm Artifact Nonspecific ST abnormality Confirmed by Ula Barter [phone]) on 05/08/2024 8:38:24 PM  Radiology: CT Chest Wo Contrast Result Date: 05/08/2024 EXAM: CT CHEST WITHOUT CONTRAST 05/08/2024 09:55:00 PM TECHNIQUE: CT of the chest was performed without the administration of intravenous contrast. Multiplanar reformatted images are provided for review. Automated exposure control, iterative reconstruction, and/or weight based adjustment of the mA/kV was utilized to reduce the radiation dose to as low as reasonably achievable. COMPARISON: Chest radiograph earlier today and CTA chest dated 08/03/2021. CLINICAL HISTORY: Cough, chronic/persisting > 8 weeks, failed empiric treatment. FINDINGS: MEDIASTINUM: Cardiomegaly. Severe 3-vessel coronary atherosclerosis. Enlargement of the main pulmonary artery, suggesting pulmonary arterial hypertension. Mild thoracic aortic atherosclerosis. The pericardium is unremarkable. The central airways are clear. LYMPH NODES: No mediastinal, hilar or axillary lymphadenopathy. LUNGS AND PLEURA: Scattered patchy opacities in the lungs bilaterally, right lower lobe predominant, suggesting multifocal atelectasis. Superimposed right lower lobe pneumonia is possible but not favored. Bilateral lower lobe  bronchiectasis. No frank interstitial edema. No pleural effusion or pneumothorax. SOFT TISSUES/BONES: No acute abnormality of the bones or soft tissues. UPPER ABDOMEN: Limited images of the upper abdomen demonstrates no acute abnormality. IMPRESSION: 1. Scattered patchy pulmonary opacities, right lower lobe predominant, favoring multifocal atelectasis; superimposed right lower lobe pneumonia is possible but not favored. 2. Cardiomegaly. No interstitial edema. 3. Pulmonary arterial hypertension. Electronically signed by: Pinkie Pebbles MD MD 05/08/2024 10:02 PM EST RP Workstation: [id number]   DG Chest Port 1 View Result Date: 05/08/2024 CLINICAL DATA:  Sepsis, short of breath EXAM: PORTABLE CHEST 1 VIEW COMPARISON:  02/22/2022 FINDINGS: Single frontal view of the chest demonstrates an enlarged cardiac silhouette. There is pulmonary vascular congestion with bilateral perihilar airspace disease and small right pleural effusion, consistent with congestive heart failure. No pneumothorax. No acute bony abnormalities. IMPRESSION: 1. Congestive heart failure. Electronically Signed   By: Ozell Daring [name]   On: 05/08/2024 21:13     Procedures   Medications Ordered in the ED  lactated ringers  infusion (has no administration in time range)  lactated ringers  bolus 1,000 mL (1,000 mLs Intravenous New Bag/Given 05/08/24 2050)  lactated ringers  bolus 1,000 mL (has no administration in time range)  lactated ringers  bolus 500 mL (has no administration in time range)  cefTRIAXone  (ROCEPHIN ) 2 g in sodium chloride  0.9 % 100 mL IVPB (0 g Intravenous Stopped 05/08/24 2131)  ipratropium-albuterol  (DUONEB) 0.5-2.5 (3) MG/3ML nebulizer solution 3 mL (3 mLs Nebulization Given 05/08/24 2123)                                    Medical Decision  Making 84 year old female with past medical history of CHF and hypertension presenting to the emergency department today with cough and shortness of breath.  Patient was found to be  hypoxic initially with medics.  She is improved with supplemental oxygen  here.  I will further evaluate the patient here with a sepsis workup given her fever but suspect this very well may be due to the flu.  I will give the patient a DuoNeb here although I do not appreciate any obvious wheezing here on exam.  Will give the patient 1 L of fluids to start until we figure out her volume status based on x-ray as her exam is somewhat difficult due to body habitus.  Will hold off on the full 30 mL/kg as her maps are greater than 65 at this point.  She will require admission due to the hypoxia.  The patient does have a leukocytosis.  Lactate is mildly elevated.  Initial chest x-ray showed possible pulmonary edema.  With the fever and overall picture this seems more consistent with pneumonia.  CT chest is ordered and does show pneumonia with no pulmonary edema.  Sepsis fluids are ordered.  [name] blood pressures have improved with IV fluid administration.  A call is placed to the hospitalist service for admission.  CRITICAL CARE Performed by: Prentice JONELLE Medicus   Total critical care time: 40 minutes  Critical care time was exclusive of separately billable procedures and treating other patients.  Critical care was necessary to treat or prevent imminent or life-threatening deterioration.  Critical care was time spent personally by me on the following activities: development of treatment plan with patient and/or surrogate as well as nursing, discussions with consultants, evaluation of patient's response to treatment, examination of patient, obtaining history from patient or surrogate, ordering and performing treatments and interventions, ordering and review of laboratory studies, ordering and review of radiographic studies, pulse oximetry and re-evaluation of patient's condition.   Amount and/or Complexity of Data Reviewed Labs: ordered. Radiology: ordered.  Risk Prescription drug management. Decision  regarding hospitalization.        Final diagnoses:  Septic shock (HCC)  Pneumonia due to infectious organism, unspecified laterality, unspecified part of lung  Hypoxia    ED Discharge Orders     None          [facility] Prentice JONELLE, MD 05/08/24 2223  "

## 2024-05-08 NOTE — Progress Notes (Signed)
 Pharmacy Antibiotic Note  [name] is a 84 y.o. female for which pharmacy has been consulted for cefepime  and vancomycin  dosing for sepsis.  Patient with a history of HF, HTN. Patient presenting with SOB.  SCr 0.89 WBC 12.2; LA 2; T 102.9; HR 85; RR 25 COVID neg / flu neg  Rocephin  x 1 in ED  Plan: Cefepime  2g q8hr  Vancomycin  1750 mg once then 1500 mg q48hr (eAUC 485.4) unless change in renal function Monitor WBC, fever, renal function, cultures De-escalate when able Levels at steady state F/u MRSA PCR     Temp (24hrs), Avg:102.9 F (39.4 C), Min:102.9 F (39.4 C), Max:102.9 F (39.4 C)  Recent Labs  Lab 05/08/24 2016 05/08/24 2107  WBC 12.2*  --   CREATININE 0.89  --   LATICACIDVEN  --  2.0*    CrCl cannot be calculated (Unknown ideal weight.).    Allergies[1]  Microbiology results: Pending  Thank you for allowing pharmacy to be a part of this patients care.  Dorn Buttner, PharmD, BCPS 05/08/2024 11:09 PM ED Clinical Pharmacist -  [phone]      [1]  Allergies Allergen Reactions   Doxycycline  Other (See Comments)    Unsteady gait   Aspirin  Nausea And Vomiting   Azithromycin  Other (See Comments)    Unknown reaction per family member messed her up   Caffeine Nausea And Vomiting   Ciprofloxacin Nausea And Vomiting    Tolerates levaquin    Codeine Nausea And Vomiting   Oxycodone  Other (See Comments)    Pt stated, upsets my stomach (11/19/17)   Sulfamethizole Other (See Comments)    Unknown reaction   Tramadol Hcl Other (See Comments)    REACTION: pt states spaced-out

## 2024-05-08 NOTE — Sepsis Progress Note (Signed)
 Following for sepsis monitoring ?

## 2024-05-08 NOTE — ED Triage Notes (Signed)
 Per EMS pt coming form home after having sob with hypoxia. O2 sat 65% on RA, 80% on home CPAP, and after being placed on NRB by EMS 100% on RA. Pt has hx of CHF

## 2024-05-09 ENCOUNTER — Inpatient Hospital Stay (HOSPITAL_COMMUNITY)

## 2024-05-09 LAB — CBC
HCT: 32.3 % — ABNORMAL LOW (ref 36.0–46.0)
Hemoglobin: 10.2 g/dL — ABNORMAL LOW (ref 12.0–15.0)
MCH: 28.9 pg (ref 26.0–34.0)
MCHC: 31.6 g/dL (ref 30.0–36.0)
MCV: 91.5 fL (ref 80.0–100.0)
Platelets: 350 K/uL (ref 150–400)
RBC: 3.53 MIL/uL — ABNORMAL LOW (ref 3.87–5.11)
RDW: 17.2 % — ABNORMAL HIGH (ref 11.5–15.5)
WBC: 13 K/uL — ABNORMAL HIGH (ref 4.0–10.5)
nRBC: 0 % (ref 0.0–0.2)

## 2024-05-09 LAB — COMPREHENSIVE METABOLIC PANEL WITH GFR
ALT: 5 U/L (ref 0–44)
AST: 18 U/L (ref 15–41)
Albumin: 3.2 g/dL — ABNORMAL LOW (ref 3.5–5.0)
Alkaline Phosphatase: 72 U/L (ref 38–126)
Anion gap: 11 (ref 5–15)
BUN: 7 mg/dL — ABNORMAL LOW (ref 8–23)
CO2: 23 mmol/L (ref 22–32)
Calcium: 8.1 mg/dL — ABNORMAL LOW (ref 8.9–10.3)
Chloride: 104 mmol/L (ref 98–111)
Creatinine, Ser: 0.74 mg/dL (ref 0.44–1.00)
GFR, Estimated: >60 mL/min
Glucose, Bld: 244 mg/dL — ABNORMAL HIGH (ref 70–99)
Potassium: 4.1 mmol/L (ref 3.5–5.1)
Sodium: 138 mmol/L (ref 135–145)
Total Bilirubin: 0.4 mg/dL (ref 0.0–1.2)
Total Protein: 6.2 g/dL — ABNORMAL LOW (ref 6.5–8.1)

## 2024-05-09 LAB — ECHOCARDIOGRAM COMPLETE
AR max vel: 4.07 cm2
AV Peak grad: 5.3 mmHg
Ao pk vel: 1.15 m/s
Area-P 1/2: 3.48 cm2
Calc EF: 67.1 %
S' Lateral: 2.3 cm
Single Plane A2C EF: 64.6 %
Single Plane A4C EF: 69 %

## 2024-05-09 LAB — GLUCOSE, CAPILLARY: Glucose-Capillary: 293 mg/dL — ABNORMAL HIGH (ref 70–99)

## 2024-05-09 LAB — I-STAT CG4 LACTIC ACID, ED: Lactic Acid, Venous: 1.1 mmol/L (ref 0.5–1.9)

## 2024-05-09 MED ORDER — IPRATROPIUM-ALBUTEROL 0.5-2.5 (3) MG/3ML IN SOLN
3.0000 mL | RESPIRATORY_TRACT | Status: DC | PRN
  Administered 2024-05-09: 3 mL via RESPIRATORY_TRACT
  Filled 2024-05-09: qty 3

## 2024-05-09 MED ORDER — INSULIN ASPART 100 UNIT/ML IJ SOLN
0.0000 [IU] | Freq: Every day | INTRAMUSCULAR | Status: DC
  Administered 2024-05-09: 3 [IU] via SUBCUTANEOUS
  Administered 2024-05-10: 2 [IU] via SUBCUTANEOUS
  Administered 2024-05-11: 3 [IU] via SUBCUTANEOUS
  Administered 2024-05-12 – 2024-05-13 (×2): 2 [IU] via SUBCUTANEOUS
  Filled 2024-05-09 (×2): qty 1
  Filled 2024-05-09: qty 4
  Filled 2024-05-09 (×2): qty 1

## 2024-05-09 MED ORDER — FUROSEMIDE 10 MG/ML IJ SOLN
80.0000 mg | Freq: Every day | INTRAMUSCULAR | Status: DC
  Administered 2024-05-09: 80 mg via INTRAVENOUS
  Filled 2024-05-09 (×2): qty 8

## 2024-05-09 MED ORDER — FUROSEMIDE 10 MG/ML IJ SOLN
80.0000 mg | Freq: Once | INTRAMUSCULAR | Status: AC
  Administered 2024-05-09: 80 mg via INTRAVENOUS
  Filled 2024-05-09: qty 8

## 2024-05-09 MED ORDER — INSULIN ASPART 100 UNIT/ML IJ SOLN
0.0000 [IU] | Freq: Three times a day (TID) | INTRAMUSCULAR | Status: DC
  Administered 2024-05-10: 1 [IU] via SUBCUTANEOUS
  Administered 2024-05-10: 2 [IU] via SUBCUTANEOUS
  Administered 2024-05-10 – 2024-05-12 (×5): 1 [IU] via SUBCUTANEOUS
  Administered 2024-05-12 – 2024-05-13 (×2): 3 [IU] via SUBCUTANEOUS
  Administered 2024-05-13 (×2): 1 [IU] via SUBCUTANEOUS
  Administered 2024-05-14: 2 [IU] via SUBCUTANEOUS
  Filled 2024-05-09 (×11): qty 1

## 2024-05-09 NOTE — ED Notes (Signed)
 Pt states she does not want to take any of her daily meds until she has eaten her lunch tray due to not having eaten in a long time

## 2024-05-09 NOTE — Progress Notes (Signed)
 Echocardiogram 2D Echocardiogram has been performed.  [name] N Lisle Skillman,RDCS 05/09/2024, 3:29 PM

## 2024-05-09 NOTE — ED Notes (Signed)
 Pt sitting on side of bed eating lunch tray with family present and helping at this time

## 2024-05-09 NOTE — ED Notes (Signed)
 ED TO INPATIENT HANDOFF REPORT  ED Nurse Name and Phone #: Mitzie [phone]  S Name/Age/Gender [name] 84 y.o. female Room/Bed: 001C/001C  Code Status   Code Status: Full Code  Home/SNF/Other Home Patient oriented to: self, place, and time Is this baseline? Yes   Triage Complete: Triage complete  Chief Complaint Community acquired bacterial pneumonia [J15.9]  Triage Note Per EMS pt coming form home after having sob with hypoxia. O2 sat 65% on RA, 80% on home CPAP, and after being placed on NRB by EMS 100% on RA. Pt has hx of CHF   Allergies Allergies[1]  Level of Care/Admitting Diagnosis ED Disposition     ED Disposition  Admit   Condition  --   Comment  Hospital Area: [facility] [[id number]]  Level of Care: Progressive [102]  Admit to Progressive based on following criteria: MULTISYSTEM THREATS such as stable sepsis, metabolic/electrolyte imbalance with or without encephalopathy that is responding to early treatment.  May admit patient to [facility] or [facility] if equivalent level of care is available:: No  Diagnosis: Community acquired bacterial pneumonia [676178]  Admitting Physician: DENA CHARLESTON [[id number]]  Attending Physician: DENA CHARLESTON [[id number]]  Certification:: I certify this patient will need inpatient services for at least 2 midnights  Expected Medical Readiness: 05/12/2024          B Medical/Surgery History Past Medical History:  Diagnosis Date   Adenomatous colon polyp 02/2006   Allergy    Allergy, unspecified not elsewhere classified    Anemia    Anxiety    Arthritis    Asthma    Blood transfusion without reported diagnosis    Bronchiectasis    CAD (coronary artery disease)    BMS to the LAD, 2002; cath 12/07/11 patent LAD stent and mild nonobstructive disease, EF 65%; Medical management   Cataract    removed both eyes   CHF (congestive heart failure) (HCC)    Diastolic   Chronic diastolic CHF (congestive  heart failure) (HCC) 11/25/2011   Clotting disorder    in legs per pt    Diabetes mellitus    Diverticulosis of colon    DJD (degenerative joint disease)    Fibromyalgia    GERD (gastroesophageal reflux disease)    H. pylori infection 05/12/2019   Hypercholesterolemia    Hypertension    Microscopic hematuria    Neoplasm of kidney    Obesity    Other diseases of lung, not elsewhere classified    Pinched vertebral nerve    Pulmonary nodule    Negative PET in 2010   Stress incontinence, female    Syncope    Ulcerative colitis, left sided (HCC) 2008   Hx of   UTI (lower urinary tract infection)    Vitamin D  deficiency disease    Past Surgical History:  Procedure Laterality Date   ABDOMINAL HYSTERECTOMY     CARDIAC CATHETERIZATION  11/2011   CATARACT EXTRACTION, BILATERAL  2020   CHOLECYSTECTOMY     COLONOSCOPY  01/09/2018   per Dr. Aneita, adenomatous polyps, repeat in 3 yrs   CORONARY STENT PLACEMENT  2002   BMS to the LAD   ESOPHAGOGASTRODUODENOSCOPY  12/2008   HAND SURGERY  01/2006   Right Hand Surgery by Dr. Camella   LEFT HEART CATHETERIZATION WITH CORONARY ANGIOGRAM N/A 12/07/2011   Procedure: LEFT HEART CATHETERIZATION WITH CORONARY ANGIOGRAM;  Surgeon: Peter M Jordan, MD;  Location: [facility] CATH LAB;  Service: Cardiovascular;  Laterality: N/A;   RIGHT/LEFT  HEART CATH AND CORONARY ANGIOGRAPHY N/A 08/07/2021   Procedure: RIGHT/LEFT HEART CATH AND CORONARY ANGIOGRAPHY;  Surgeon: Cherrie Toribio SAUNDERS, MD;  Location: [facility] INVASIVE CV LAB;  Service: Cardiovascular;  Laterality: N/A;   UPPER GASTROINTESTINAL ENDOSCOPY       A IV Location/Drains/Wounds Patient Lines/Drains/Airways Status     Active Line/Drains/Airways     Name Placement date Placement time Site Days   Peripheral IV 05/08/24 20 G Anterior;Proximal;Right Antecubital 05/08/24  2049  Antecubital  1   Peripheral IV 05/08/24 18 G Left Antecubital 05/08/24  2000  Antecubital  1            Intake/Output Last 24  hours  Intake/Output Summary (Last 24 hours) at 05/09/2024 1441 Last data filed at 05/09/2024 1347 Gross per 24 hour  Intake 2033.3 ml  Output 2100 ml  Net -66.7 ml    Labs/Imaging Results for orders placed or performed during the hospital encounter of 05/08/24 (from the past 48 hours)  Resp panel by RT-PCR (RSV, Flu A&B, Covid) Anterior Nasal Swab     Status: None   Collection Time: 05/08/24  7:56 PM   Specimen: Anterior Nasal Swab  Result Value Ref Range   SARS Coronavirus 2 by RT PCR NEGATIVE NEGATIVE   Influenza A by PCR NEGATIVE NEGATIVE   Influenza B by PCR NEGATIVE NEGATIVE    Comment: (NOTE) The Xpert Xpress SARS-CoV-2/FLU/RSV plus assay is intended as an aid in the diagnosis of influenza from Nasopharyngeal swab specimens and should not be used as a sole basis for treatment. Nasal washings and aspirates are unacceptable for Xpert Xpress SARS-CoV-2/FLU/RSV testing.  Fact Sheet for Patients: bloggercourse.com  Fact Sheet for Healthcare Providers: seriousbroker.it  This test is not yet approved or cleared by the [place]  FDA and has been authorized for detection and/or diagnosis of SARS-CoV-2 by FDA under an Emergency Use Authorization (EUA). This EUA will remain in effect (meaning this test can be used) for the duration of the COVID-19 declaration under Section 564(b)(1) of the Act, 21 U.S.C. section 360bbb-3(b)(1), unless the authorization is terminated or revoked.     Resp Syncytial Virus by PCR NEGATIVE NEGATIVE    Comment: (NOTE) Fact Sheet for Patients: bloggercourse.com  Fact Sheet for Healthcare Providers: seriousbroker.it  This test is not yet approved or cleared by the [place]  FDA and has been authorized for detection and/or diagnosis of SARS-CoV-2 by FDA under an Emergency Use Authorization (EUA). This EUA will remain in effect (meaning this  test can be used) for the duration of the COVID-19 declaration under Section 564(b)(1) of the Act, 21 U.S.C. section 360bbb-3(b)(1), unless the authorization is terminated or revoked.  Performed at [facility], [address] [place], [place] [address]   Comprehensive metabolic panel     Status: Abnormal   Collection Time: 05/08/24  8:16 PM  Result Value Ref Range   Sodium 135 135 - 145 mmol/L   Potassium 5.2 (H) 3.5 - 5.1 mmol/L    Comment: HEMOLYSIS AT THIS LEVEL MAY AFFECT RESULT   Chloride 101 98 - 111 mmol/L   CO2 23 22 - 32 mmol/L   Glucose, Bld 256 (H) 70 - 99 mg/dL    Comment: Glucose reference range applies only to samples taken after fasting for at least 8 hours.   BUN 8 8 - 23 mg/dL   Creatinine, Ser 9.10 0.44 - 1.00 mg/dL   Calcium  8.6 (L) 8.9 - 10.3 mg/dL   Total Protein 6.7 6.5 -  8.1 g/dL   Albumin 3.5 3.5 - 5.0 g/dL   AST 22 15 - 41 U/L    Comment: HEMOLYSIS AT THIS LEVEL MAY AFFECT RESULT   ALT 7 0 - 44 U/L   Alkaline Phosphatase 78 38 - 126 U/L   Total Bilirubin 0.5 0.0 - 1.2 mg/dL   GFR, Estimated >39 >39 mL/min    Comment: (NOTE) Calculated using the CKD-EPI Creatinine Equation (2021)    Anion gap 11 5 - 15    Comment: Performed at [facility], [address], [place], [place] [address]  CBC with Differential     Status: Abnormal   Collection Time: 05/08/24  8:16 PM  Result Value Ref Range   WBC 12.2 (H) 4.0 - 10.5 K/uL   RBC 3.74 (L) 3.87 - 5.11 MIL/uL   Hemoglobin 10.8 (L) 12.0 - 15.0 g/dL   HCT 66.0 (L) 63.9 - 53.9 %   MCV 90.6 80.0 - 100.0 fL   MCH 28.9 26.0 - 34.0 pg   MCHC 31.9 30.0 - 36.0 g/dL   RDW 82.7 (H) 88.4 - 84.4 %   Platelets 382 150 - 400 K/uL   nRBC 0.0 0.0 - 0.2 %   Neutrophils Relative % 79 %   Neutro Abs 9.6 (H) 1.7 - 7.7 K/uL   Lymphocytes Relative 10 %   Lymphs Abs 1.3 0.7 - 4.0 K/uL   Monocytes Relative 9 %   Monocytes Absolute 1.0 0.1 - 1.0 K/uL   Eosinophils Relative 1 %   Eosinophils Absolute 0.1 0.0 -  0.5 K/uL   Basophils Relative 0 %   Basophils Absolute 0.0 0.0 - 0.1 K/uL   Immature Granulocytes 1 %   Abs Immature Granulocytes 0.12 (H) 0.00 - 0.07 K/uL    Comment: Performed at [facility], [address], [place], [place] [address]  Protime-INR     Status: None   Collection Time: 05/08/24  8:16 PM  Result Value Ref Range   Prothrombin Time 15.0 11.4 - 15.2 seconds   INR 1.1 0.8 - 1.2    Comment: (NOTE) INR goal varies based on device and disease states. Performed at [facility], [address] [place], [place] [address]   Blood Culture (routine x 2)     Status: None (Preliminary result)   Collection Time: 05/08/24  8:16 PM   Specimen: BLOOD RIGHT ARM  Result Value Ref Range   Specimen Description BLOOD RIGHT ARM    Special Requests      BOTTLES DRAWN AEROBIC AND ANAEROBIC Blood Culture results may not be optimal due to an inadequate volume of blood received in culture bottles   Culture      NO GROWTH < 12 HOURS Performed at [facility], [address] [place], [place] [address]    Report Status PENDING   Pro Brain natriuretic peptide     Status: Abnormal   Collection Time: 05/08/24  8:16 PM  Result Value Ref Range   Pro Brain Natriuretic Peptide 1,166.0 (H) <300.0 pg/mL    Comment: (NOTE) Age Group        Cut-Points    Interpretation  < 50 years     450 pg/mL       NT-proBNP > 450 pg/mL indicates                                ADHF is likely  50 to 75 years  900 pg/mL      NT-proBNP > 900 pg/mL indicates          ADHF is likely  > 75 years      1800 pg/mL     NT-proBNP > 1800 pg/mL indicates          ADHF is likely                           All ages    Results between       Indeterminate. Further clinical             300 and the cut-   information is needed to determine            point for age group   if ADHF is present.                                                             Elecsys proBNP II/ Elecsys proBNP II STAT            Cut-Point                       Interpretation  300 pg/mL                    NT-proBNP <300pg/mL indicates                             ADHF is not likely  Performed at [facility], [address], [place], [place] [address]   Blood Culture (routine x 2)     Status: None (Preliminary result)   Collection Time: 05/08/24  8:46 PM   Specimen: BLOOD RIGHT ARM  Result Value Ref Range   Specimen Description BLOOD RIGHT ARM    Special Requests      BOTTLES DRAWN AEROBIC AND ANAEROBIC Blood Culture results may not be optimal due to an inadequate volume of blood received in culture bottles   Culture      NO GROWTH < 12 HOURS Performed at [facility], [address] [place], [place] [address]    Report Status PENDING   I-Stat Lactic Acid, ED     Status: Abnormal   Collection Time: 05/08/24  9:07 PM  Result Value Ref Range   Lactic Acid, Venous 2.0 (HH) 0.5 - 1.9 mmol/L  Urinalysis, w/ Reflex to Culture (Infection Suspected) -Urine, Clean Catch     Status: Abnormal   Collection Time: 05/08/24 10:58 PM  Result Value Ref Range   Specimen Source URINE, CLEAN CATCH    Color, Urine YELLOW YELLOW   APPearance HAZY (A) CLEAR   Specific Gravity, Urine 1.014 1.005 - 1.030   pH 5.0 5.0 - 8.0   Glucose, UA >=500 (A) NEGATIVE mg/dL   Hgb urine dipstick SMALL (A) NEGATIVE   Bilirubin Urine NEGATIVE NEGATIVE   Ketones, ur NEGATIVE NEGATIVE mg/dL   Protein, ur NEGATIVE NEGATIVE mg/dL   Nitrite NEGATIVE NEGATIVE   Leukocytes,Ua LARGE (A) NEGATIVE   RBC / HPF 0-5 0 - 5 RBC/hpf   WBC, UA 11-20 0 - 5 WBC/hpf    Comment:  Reflex urine culture not performed if WBC <=10, OR if Squamous epithelial cells >5. If Squamous epithelial cells >5 suggest recollection.    Bacteria, UA RARE (A) NONE SEEN   Squamous Epithelial / HPF 0-5 0 - 5 /HPF    Comment: Performed at [facility], [address], [place], [place] [address]  I-Stat Lactic Acid, ED     Status: None    Collection Time: 05/09/24 12:25 AM  Result Value Ref Range   Lactic Acid, Venous 1.1 0.5 - 1.9 mmol/L  CBC     Status: Abnormal   Collection Time: 05/09/24  3:37 AM  Result Value Ref Range   WBC 13.0 (H) 4.0 - 10.5 K/uL   RBC 3.53 (L) 3.87 - 5.11 MIL/uL   Hemoglobin 10.2 (L) 12.0 - 15.0 g/dL   HCT 67.6 (L) 63.9 - 53.9 %   MCV 91.5 80.0 - 100.0 fL   MCH 28.9 26.0 - 34.0 pg   MCHC 31.6 30.0 - 36.0 g/dL   RDW 82.7 (H) 88.4 - 84.4 %   Platelets 350 150 - 400 K/uL   nRBC 0.0 0.0 - 0.2 %    Comment: Performed at [facility], [address], [place], [place] [address]  Comprehensive metabolic panel     Status: Abnormal   Collection Time: 05/09/24  3:37 AM  Result Value Ref Range   Sodium 138 135 - 145 mmol/L   Potassium 4.1 3.5 - 5.1 mmol/L    Comment: Delta check noted    Chloride 104 98 - 111 mmol/L   CO2 23 22 - 32 mmol/L   Glucose, Bld 244 (H) 70 - 99 mg/dL    Comment: Glucose reference range applies only to samples taken after fasting for at least 8 hours.   BUN 7 (L) 8 - 23 mg/dL   Creatinine, Ser 9.25 0.44 - 1.00 mg/dL   Calcium  8.1 (L) 8.9 - 10.3 mg/dL   Total Protein 6.2 (L) 6.5 - 8.1 g/dL   Albumin 3.2 (L) 3.5 - 5.0 g/dL   AST 18 15 - 41 U/L   ALT 5 0 - 44 U/L   Alkaline Phosphatase 72 38 - 126 U/L   Total Bilirubin 0.4 0.0 - 1.2 mg/dL   GFR, Estimated >39 >39 mL/min    Comment: (NOTE) Calculated using the CKD-EPI Creatinine Equation (2021)    Anion gap 11 5 - 15    Comment: Performed at [facility], [address] [place], [place] [address]   *Note: Due to a large number of results and/or encounters for the requested time period, some results have not been displayed. A complete set of results can be found in Results Review.   CT Chest Wo Contrast Result Date: 05/08/2024 EXAM: CT CHEST WITHOUT CONTRAST 05/08/2024 09:55:00 PM TECHNIQUE: CT of the chest was performed without the administration of intravenous contrast. Multiplanar reformatted images are  provided for review. Automated exposure control, iterative reconstruction, and/or weight based adjustment of the mA/kV was utilized to reduce the radiation dose to as low as reasonably achievable. COMPARISON: Chest radiograph earlier today and CTA chest dated 08/03/2021. CLINICAL HISTORY: Cough, chronic/persisting > 8 weeks, failed empiric treatment. FINDINGS: MEDIASTINUM: Cardiomegaly. Severe 3-vessel coronary atherosclerosis. Enlargement of the main pulmonary artery, suggesting pulmonary arterial hypertension. Mild thoracic aortic atherosclerosis. The pericardium is unremarkable. The central airways are clear. LYMPH NODES: No mediastinal, hilar or axillary lymphadenopathy. LUNGS AND PLEURA: Scattered patchy opacities in the lungs bilaterally, right lower lobe  predominant, suggesting multifocal atelectasis. Superimposed right lower lobe pneumonia is possible but not favored. Bilateral lower lobe bronchiectasis. No frank interstitial edema. No pleural effusion or pneumothorax. SOFT TISSUES/BONES: No acute abnormality of the bones or soft tissues. UPPER ABDOMEN: Limited images of the upper abdomen demonstrates no acute abnormality. IMPRESSION: 1. Scattered patchy pulmonary opacities, right lower lobe predominant, favoring multifocal atelectasis; superimposed right lower lobe pneumonia is possible but not favored. 2. Cardiomegaly. No interstitial edema. 3. Pulmonary arterial hypertension. Electronically signed by: Pinkie Pebbles MD MD 05/08/2024 10:02 PM EST RP Workstation: [id number]   DG Chest Port 1 View Result Date: 05/08/2024 CLINICAL DATA:  Sepsis, short of breath EXAM: PORTABLE CHEST 1 VIEW COMPARISON:  02/22/2022 FINDINGS: Single frontal view of the chest demonstrates an enlarged cardiac silhouette. There is pulmonary vascular congestion with bilateral perihilar airspace disease and small right pleural effusion, consistent with congestive heart failure. No pneumothorax. No acute bony abnormalities.  IMPRESSION: 1. Congestive heart failure. Electronically Signed   By: Ozell Daring [name]   On: 05/08/2024 21:13    Pending Labs Unresulted Labs (From admission, onward)     Start     Ordered   05/09/24 1142  Legionella Pneumophila Serogp 1 Ur Ag  Once,   R        05/09/24 1143   05/09/24 1142  Strep pneumoniae urinary antigen  Once,   R        05/09/24 1143   05/08/24 2317  MRSA Next Gen by PCR, Nasal  (MRSA Screening)  Once,   R        05/08/24 2316   05/08/24 2258  Urine Culture  Once,   R        05/08/24 2258            Vitals/Pain Today's Vitals   05/09/24 1036 05/09/24 1036 05/09/24 1145 05/09/24 1346  BP:   126/71   Pulse:   80   Resp:   (!) 23   Temp:    98 F (36.7 C)  TempSrc:    Oral  SpO2:   96%   PainSc: 0-No pain 0-No pain      Isolation Precautions No active isolations  Medications Medications  colchicine  tablet 0.6 mg (0.6 mg Oral Given 05/09/24 1340)  morphine  (MS CONTIN ) 12 hr tablet 30 mg (30 mg Oral Given 05/09/24 1342)  amLODipine  (NORVASC ) tablet 10 mg (10 mg Oral Given 05/09/24 1338)  atorvastatin  (LIPITOR) tablet 20 mg (20 mg Oral Given 05/09/24 1344)  colestipol  (COLESTID ) tablet 2 g (2 g Oral Given 05/09/24 1344)  isosorbide  mononitrate (IMDUR ) 24 hr tablet 30 mg (30 mg Oral Given 05/09/24 1339)  metoprolol  succinate (TOPROL -XL) 24 hr tablet 100 mg (100 mg Oral Given 05/09/24 1341)  losartan  (COZAAR ) tablet 25 mg (25 mg Oral Given 05/09/24 1340)  ALPRAZolam  (XANAX ) tablet 0.5 mg (has no administration in time range)  budesonide  (ENTOCORT EC ) 24 hr capsule 9 mg (9 mg Oral Given 05/09/24 1342)  mesalamine  (LIALDA ) EC tablet 2.4 g (2.4 g Oral Given 05/09/24 1344)  lipase/protease/amylase (CREON ) capsule 24,000 Units (24,000 Units Oral Given 05/09/24 1343)  pantoprazole  (PROTONIX ) EC tablet 40 mg (40 mg Oral Given 05/09/24 1341)  clopidogrel  (PLAVIX ) tablet 75 mg (75 mg Oral Given 05/09/24 1343)  gabapentin  (NEURONTIN ) capsule 300 mg (300 mg Oral Given  05/09/24 1339)  HYDROcodone  bit-homatropine (HYCODAN) 5-1.5 MG/5ML syrup 5 mL (has no administration in time range)  enoxaparin  (LOVENOX ) injection 40 mg (40 mg Subcutaneous Given 05/09/24 1110)  acetaminophen  (TYLENOL ) tablet 650 mg (650 mg Oral Given 05/09/24 0929)    Or  acetaminophen  (TYLENOL ) suppository 650 mg ( Rectal See Alternative 05/09/24 0929)  ondansetron  (ZOFRAN ) tablet 4 mg (has no administration in time range)    Or  ondansetron  (ZOFRAN ) injection 4 mg (has no administration in time range)  ceFEPIme  (MAXIPIME ) 2 g in sodium chloride  0.9 % 100 mL IVPB (2 g Intravenous New Bag/Given 05/09/24 1353)  vancomycin  (VANCOREADY) IVPB 1750 mg/350 mL (0 mg Intravenous Stopped 05/09/24 0255)    Followed by  vancomycin  (VANCOREADY) IVPB 1500 mg/300 mL (has no administration in time range)  furosemide  (LASIX ) injection 80 mg (80 mg Intravenous Given 05/09/24 1346)  ipratropium-albuterol  (DUONEB) 0.5-2.5 (3) MG/3ML nebulizer solution 3 mL (has no administration in time range)  lactated ringers  bolus 1,000 mL (0 mLs Intravenous Stopped 05/08/24 2243)  cefTRIAXone  (ROCEPHIN ) 2 g in sodium chloride  0.9 % 100 mL IVPB (0 g Intravenous Stopped 05/08/24 2131)  ipratropium-albuterol  (DUONEB) 0.5-2.5 (3) MG/3ML nebulizer solution 3 mL (3 mLs Nebulization Given 05/08/24 2123)  lactated ringers  bolus 1,000 mL (0 mLs Intravenous Stopped 05/09/24 0313)  lactated ringers  bolus 500 mL (0 mLs Intravenous Stopped 05/09/24 0313)  furosemide  (LASIX ) injection 80 mg (80 mg Intravenous Given 05/09/24 0324)  furosemide  (LASIX ) injection 80 mg (80 mg Intravenous Given 05/09/24 1110)    Mobility non-ambulatory     Focused Assessments Pulmonary Assessment Handoff:  Lung sounds: Bilateral Breath Sounds: Diminished O2 Device: High Flow Nasal Cannula O2 Flow Rate (L/min): 10 L/min    R Recommendations: See Admitting Provider Note  Report given to:   Additional Notes: Pt is on high flow oxygen . Pt removed [place] on her own  for a few mins while eating and desat to 70% so she cannot tolerate anything less.       [1]  Allergies Allergen Reactions   Doxycycline  Other (See Comments)    Unsteady gait   Aspirin  Nausea And Vomiting   Azithromycin  Other (See Comments)    Unknown reaction per family member messed her up   Caffeine Nausea And Vomiting   Ciprofloxacin Nausea And Vomiting    Tolerates levaquin    Codeine Nausea And Vomiting   Oxycodone  Other (See Comments)    Pt stated, upsets my stomach (11/19/17)   Sulfamethizole Other (See Comments)    Unknown reaction   Tramadol Hcl Other (See Comments)    REACTION: pt states spaced-out

## 2024-05-09 NOTE — Progress Notes (Addendum)
 "                                                                                                                                                                                                                                                                                PROGRESS NOTE     Patient Demographics:    [name], is a 84 y.o. female, DOB - 1941-04-28, FMW:[id number]  Outpatient Primary MD for the patient is [name], MD    LOS - 1  Admit date - 05/08/2024    Chief Complaint  Patient presents with   Shortness of Breath       Brief Narrative (HPI from H&P)    [name] is a 84 y.o. female with medical history significant of CHF, hypertension who presents emergency department with shortness of breath.  Patient is a poor historian however on arrival was found to be satting 65%.  Was placed on supplemental oxygen  with improvement.  She was endorsing a profound cough and found to be febrile.  She was recently seen by primary care doctor and given Levaquin  for presumed pneumonia.  On arrival labs were obtained which showed respiratory viral panel negative, potassium 5.2, glucose 300, WBC 12.2, hemoglobin 10.8, INR 1.1, BNP 1100, lactic acid 2.0.  Patient underwent CT chest which demonstrated pulmonary opacities.  Chest x-ray showed volume overload.  Patient was started on IV antibiotics admitted for further workup.  On evaluation patient had 3+ pitting edema and JVD.    Subjective:   No acute events overnight, patient was seen and evaluated at bedside.  Symptomatically improved.    Assessment  & Plan :  Principal Problem:   Community acquired bacterial pneumonia   #Acute hypoxic respiratory failure, improving #Community-acquired bacterial pneumonia # Sepsis, resolved Presented to ED due to shortness of breath, confusion.  Endorses a profound cough and noted to be febrile.  Found to be hypoxic with SpO2 65%.  Started on supplemental O2.  Opacities on chest imaging.  CT  chest shows pulmonary opacities. - Continue IV vancomycin , cefepime  - Blood cultures pending, check MRSA, Legionella, strep pneumo antigens - Continue symptomatic treatment - Titrate oxygen  as tolerated   #  Fluid overload #Acute on chronic diastolic heart failure #Pulmonary hypertension Echo in 07/2021 with EF 35 to 40%, global hypokinesis.  Follow-up echo performed in 12/23 with improved EF 50 to 55%.  Symptomatically fluid overloaded on presentation.  BNP 1166 on presentation. -Continue IV Lasix  80 mg daily -Echo pending -Continue GDMT: Losartan , metoprolol , Imdur    # Generalized anxiety-continue Xanax    # Hypertension-continue amlodipine , Imdur , losartan , metoprolol    # Pancreatic insufficiency-continue Creon    # Chronic pain-continue gabapentin    # CAD history of bare metal stent placement in the proximal LAD, with subsequent cardiac catheterizations showing a patent stent.  - continue Plavix    # Ulcerative colitis-continue Lialda , budesonide , colestid    Patient Lines/Drains/Airways Status     Active Line/Drains/Airways     Name Placement date Placement time Site Days   Peripheral IV 05/08/24 20 G Anterior;Proximal;Right Antecubital 05/08/24  2049  Antecubital  1   Peripheral IV 05/08/24 18 G Left Antecubital 05/08/24  2000  Antecubital  1              Nutrition Problem:        Obesity: Estimated body mass index is 36.58 kg/m as calculated from the following:   Height as of 02/27/24: 5' (1.524 m).   Weight as of 02/27/24: 85 kg.          Condition - Guarded  Family Communication  : Spoke with family at bedside  Code Status : Full code      Disposition Plan  :    Status is: Inpatient   DVT Prophylaxis  :    enoxaparin  (LOVENOX ) injection 40 mg Start: 05/09/24 1000 SCDs Start: 05/08/24 2300   Lab Results  Component Value Date   PLT 350 05/09/2024    Diet :  Diet Order             Diet Carb Modified Room service appropriate? Yes   Diet effective now                    Inpatient Medications  Scheduled Meds:  amLODipine   10 mg Oral Daily   atorvastatin   20 mg Oral Daily   budesonide   9 mg Oral Daily   clopidogrel   75 mg Oral Daily   colchicine   0.6 mg Oral BID   colestipol   2 g Oral BID   enoxaparin  (LOVENOX ) injection  40 mg Subcutaneous Daily   gabapentin   300 mg Oral BID   ipratropium-albuterol   3 mL Nebulization Once   isosorbide  mononitrate  30 mg Oral Daily   lipase/protease/amylase  24,000 Units Oral TID with meals   losartan   25 mg Oral Daily   mesalamine   2.4 g Oral BID   metoprolol  succinate  100 mg Oral Daily   morphine   30 mg Oral Q12H   pantoprazole   40 mg Oral BID   Continuous Infusions:  ceFEPime  (MAXIPIME ) IV Stopped (05/09/24 0547)   [START ON 05/10/2024] vancomycin      PRN Meds:.acetaminophen  **OR** acetaminophen , ALPRAZolam , HYDROcodone  bit-homatropine, ondansetron  **OR** ondansetron  (ZOFRAN ) IV  Antibiotics  :    Anti-infectives (From admission, onward)    Start     Dose/Rate Route Frequency Ordered Stop   05/10/24 2330  vancomycin  (VANCOREADY) IVPB 1500 mg/300 mL       Placed in Followed by Linked Group   1,500 mg 150 mL/hr over 120 Minutes Intravenous Every 48 hours 05/08/24 2316     05/09/24 0600  ceFEPIme  (MAXIPIME ) 2 g in sodium chloride  0.9 % 100  mL IVPB        2 g 200 mL/hr over 30 Minutes Intravenous Every 8 hours 05/08/24 2316     05/08/24 2330  vancomycin  (VANCOREADY) IVPB 1750 mg/350 mL       Placed in Followed by Linked Group   1,750 mg 175 mL/hr over 120 Minutes Intravenous  Once 05/08/24 2316 05/09/24 0255   05/08/24 2130  doxycycline  (VIBRAMYCIN ) 100 mg in sodium chloride  0.9 % 250 mL IVPB  Status:  Discontinued        100 mg 125 mL/hr over 120 Minutes Intravenous  Once 05/08/24 2119 05/08/24 2137   05/08/24 2000  cefTRIAXone  (ROCEPHIN ) 2 g in sodium chloride  0.9 % 100 mL IVPB        2 g 200 mL/hr over 30 Minutes Intravenous Once 05/08/24 1956 05/08/24  2131   05/08/24 2000  azithromycin  (ZITHROMAX ) 500 mg in sodium chloride  0.9 % 250 mL IVPB  Status:  Discontinued        500 mg 250 mL/hr over 60 Minutes Intravenous  Once 05/08/24 1956 05/08/24 2119         Objective:   Vitals:   05/09/24 0500 05/09/24 0548 05/09/24 0700 05/09/24 0827  BP: 133/70  (!) 148/72 (!) 155/82  Pulse: 86  89 98  Resp: (!) 27  (!) 25 20  Temp:  99.6 F (37.6 C)  (!) 100.8 F (38.2 C)  TempSrc:  Oral  Oral  SpO2: 94%  96% 93%    Wt Readings from Last 3 Encounters:  02/27/24 85 kg  12/17/23 95.5 kg  10/10/23 88.9 kg     Intake/Output Summary (Last 24 hours) at 05/09/2024 1127 Last data filed at 05/09/2024 0713 Gross per 24 hour  Intake 2033.3 ml  Output 900 ml  Net 1133.3 ml     Physical Exam  Awake Alert, No new F.N deficits, Normal affect [place].AT,PERRAL Symmetrical Chest wall movement, Good air movement bilaterally, CTAB RRR,No Gallops,Rubs or new Murmurs,  +ve B.Sounds, Abd Soft, No tenderness,   No Cyanosis, Clubbing or edema     RN pressure injury documentation:      Data Review:    Recent Labs  Lab 05/08/24 2016 05/09/24 0337  WBC 12.2* 13.0*  HGB 10.8* 10.2*  HCT 33.9* 32.3*  PLT 382 350  MCV 90.6 91.5  MCH 28.9 28.9  MCHC 31.9 31.6  RDW 17.2* 17.2*  LYMPHSABS 1.3  --   MONOABS 1.0  --   EOSABS 0.1  --   BASOSABS 0.0  --     Recent Labs  Lab 05/08/24 2016 05/08/24 2107 05/09/24 0025 05/09/24 0337  NA 135  --   --  138  K 5.2*  --   --  4.1  CL 101  --   --  104  CO2 23  --   --  23  ANIONGAP 11  --   --  11  GLUCOSE 256*  --   --  244*  BUN 8  --   --  7*  CREATININE 0.89  --   --  0.74  AST 22  --   --  18  ALT 7  --   --  5  ALKPHOS 78  --   --  72  BILITOT 0.5  --   --  0.4  ALBUMIN 3.5  --   --  3.2*  LATICACIDVEN  --  2.0* 1.1  --   INR 1.1  --   --   --  CALCIUM  8.6*  --   --  8.1*      Recent Labs  Lab 05/08/24 2016 05/08/24 2107 05/09/24 0025 05/09/24 0337  LATICACIDVEN  --   2.0* 1.1  --   INR 1.1  --   --   --   CALCIUM  8.6*  --   --  8.1*    --------------------------------------------------------------------------------------------------------------- Lab Results  Component Value Date   CHOL 146 06/16/2021   HDL 58.60 06/16/2021   LDLCALC 66 06/16/2021   TRIG 104.0 06/16/2021   CHOLHDL 2 06/16/2021    Lab Results  Component Value Date   HGBA1C 6.8 (A) 10/11/2022   No results for input(s): TSH, T4TOTAL, FREET4, T3FREE, THYROIDAB in the last 72 hours. No results for input(s): VITAMINB12, FOLATE, FERRITIN, TIBC, IRON , RETICCTPCT in the last 72 hours. ------------------------------------------------------------------------------------------------------------------ Cardiac Enzymes No results for input(s): CKMB, TROPONINI, MYOGLOBIN in the last 168 hours.  Invalid input(s): CK  Micro Results Recent Results (from the past 240 hours)  Resp panel by RT-PCR (RSV, Flu A&B, Covid) Anterior Nasal Swab     Status: None   Collection Time: 05/08/24  7:56 PM   Specimen: Anterior Nasal Swab  Result Value Ref Range Status   SARS Coronavirus 2 by RT PCR NEGATIVE NEGATIVE Final   Influenza A by PCR NEGATIVE NEGATIVE Final   Influenza B by PCR NEGATIVE NEGATIVE Final    Comment: (NOTE) The Xpert Xpress SARS-CoV-2/FLU/RSV plus assay is intended as an aid in the diagnosis of influenza from Nasopharyngeal swab specimens and should not be used as a sole basis for treatment. Nasal washings and aspirates are unacceptable for Xpert Xpress SARS-CoV-2/FLU/RSV testing.  Fact Sheet for Patients: bloggercourse.com  Fact Sheet for Healthcare Providers: seriousbroker.it  This test is not yet approved or cleared by the [place]  FDA and has been authorized for detection and/or diagnosis of SARS-CoV-2 by FDA under an Emergency Use Authorization (EUA). This EUA will remain in effect  (meaning this test can be used) for the duration of the COVID-19 declaration under Section 564(b)(1) of the Act, 21 U.S.C. section 360bbb-3(b)(1), unless the authorization is terminated or revoked.     Resp Syncytial Virus by PCR NEGATIVE NEGATIVE Final    Comment: (NOTE) Fact Sheet for Patients: bloggercourse.com  Fact Sheet for Healthcare Providers: seriousbroker.it  This test is not yet approved or cleared by the [place]  FDA and has been authorized for detection and/or diagnosis of SARS-CoV-2 by FDA under an Emergency Use Authorization (EUA). This EUA will remain in effect (meaning this test can be used) for the duration of the COVID-19 declaration under Section 564(b)(1) of the Act, 21 U.S.C. section 360bbb-3(b)(1), unless the authorization is terminated or revoked.  Performed at [facility], [address], [place], [place] [address]   Blood Culture (routine x 2)     Status: None (Preliminary result)   Collection Time: 05/08/24  8:16 PM   Specimen: BLOOD RIGHT ARM  Result Value Ref Range Status   Specimen Description BLOOD RIGHT ARM  Final   Special Requests   Final    BOTTLES DRAWN AEROBIC AND ANAEROBIC Blood Culture results may not be optimal due to an inadequate volume of blood received in culture bottles   Culture   Final    NO GROWTH < 12 HOURS Performed at [facility], [address], [place], [place] [address]    Report Status PENDING  Incomplete  Blood Culture (routine x 2)     Status: None (  Preliminary result)   Collection Time: 05/08/24  8:46 PM   Specimen: BLOOD RIGHT ARM  Result Value Ref Range Status   Specimen Description BLOOD RIGHT ARM  Final   Special Requests   Final    BOTTLES DRAWN AEROBIC AND ANAEROBIC Blood Culture results may not be optimal due to an inadequate volume of blood received in culture bottles   Culture   Final    NO GROWTH < 12 HOURS Performed at [facility], [address], [place], [place] [address]    Report Status PENDING  Incomplete    Radiology Report CT Chest Wo Contrast Result Date: 05/08/2024 EXAM: CT CHEST WITHOUT CONTRAST 05/08/2024 09:55:00 PM TECHNIQUE: CT of the chest was performed without the administration of intravenous contrast. Multiplanar reformatted images are provided for review. Automated exposure control, iterative reconstruction, and/or weight based adjustment of the mA/kV was utilized to reduce the radiation dose to as low as reasonably achievable. COMPARISON: Chest radiograph earlier today and CTA chest dated 08/03/2021. CLINICAL HISTORY: Cough, chronic/persisting > 8 weeks, failed empiric treatment. FINDINGS: MEDIASTINUM: Cardiomegaly. Severe 3-vessel coronary atherosclerosis. Enlargement of the main pulmonary artery, suggesting pulmonary arterial hypertension. Mild thoracic aortic atherosclerosis. The pericardium is unremarkable. The central airways are clear. LYMPH NODES: No mediastinal, hilar or axillary lymphadenopathy. LUNGS AND PLEURA: Scattered patchy opacities in the lungs bilaterally, right lower lobe predominant, suggesting multifocal atelectasis. Superimposed right lower lobe pneumonia is possible but not favored. Bilateral lower lobe bronchiectasis. No frank interstitial edema. No pleural effusion or pneumothorax. SOFT TISSUES/BONES: No acute abnormality of the bones or soft tissues. UPPER ABDOMEN: Limited images of the upper abdomen demonstrates no acute abnormality. IMPRESSION: 1. Scattered patchy pulmonary opacities, right lower lobe predominant, favoring multifocal atelectasis; superimposed right lower lobe pneumonia is possible but not favored. 2. Cardiomegaly. No interstitial edema. 3. Pulmonary arterial hypertension. Electronically signed by: Pinkie Pebbles MD MD 05/08/2024 10:02 PM EST RP Workstation: [id number]   DG Chest Port 1 View Result Date: 05/08/2024 CLINICAL DATA:  Sepsis, short of breath EXAM:  PORTABLE CHEST 1 VIEW COMPARISON:  02/22/2022 FINDINGS: Single frontal view of the chest demonstrates an enlarged cardiac silhouette. There is pulmonary vascular congestion with bilateral perihilar airspace disease and small right pleural effusion, consistent with congestive heart failure. No pneumothorax. No acute bony abnormalities. IMPRESSION: 1. Congestive heart failure. Electronically Signed   By: Ozell Daring [name]   On: 05/08/2024 21:13     Signature  -   Dyzyja F Treniece Holsclaw DO on 05/09/2024 at 11:27 AM   -  To page go to [email or web address]   "

## 2024-05-09 NOTE — ED Notes (Signed)
 Per family member at bedside, pharmacy tech requested patients med list be sent over and reviewed before patient given morning medications. Family has med list at this time, environmental manager.

## 2024-05-09 NOTE — Plan of Care (Signed)
  Problem: Clinical Measurements: Goal: Respiratory complications will improve Outcome: Progressing   Problem: Activity: Goal: Risk for activity intolerance will decrease Outcome: Progressing   Problem: Nutrition: Goal: Adequate nutrition will be maintained Outcome: Progressing   Problem: Elimination: Goal: Will not experience complications related to bowel motility Outcome: Progressing

## 2024-05-10 ENCOUNTER — Inpatient Hospital Stay (HOSPITAL_COMMUNITY)

## 2024-05-10 DIAGNOSIS — J159 Unspecified bacterial pneumonia: Secondary | ICD-10-CM | POA: Diagnosis not present

## 2024-05-10 LAB — BASIC METABOLIC PANEL WITH GFR
Anion gap: 13 (ref 5–15)
BUN: 14 mg/dL (ref 8–23)
CO2: 24 mmol/L (ref 22–32)
Calcium: 7.9 mg/dL — ABNORMAL LOW (ref 8.9–10.3)
Chloride: 99 mmol/L (ref 98–111)
Creatinine, Ser: 0.75 mg/dL (ref 0.44–1.00)
GFR, Estimated: >60 mL/min
Glucose, Bld: 221 mg/dL — ABNORMAL HIGH (ref 70–99)
Potassium: 3.7 mmol/L (ref 3.5–5.1)
Sodium: 135 mmol/L (ref 135–145)

## 2024-05-10 LAB — URINE CULTURE: Culture: 20000 — AB

## 2024-05-10 LAB — PROCALCITONIN: Procalcitonin: 0.1 ng/mL

## 2024-05-10 LAB — GLUCOSE, CAPILLARY
Glucose-Capillary: 197 mg/dL — ABNORMAL HIGH (ref 70–99)
Glucose-Capillary: 198 mg/dL — ABNORMAL HIGH (ref 70–99)
Glucose-Capillary: 217 mg/dL — ABNORMAL HIGH (ref 70–99)
Glucose-Capillary: 238 mg/dL — ABNORMAL HIGH (ref 70–99)

## 2024-05-10 LAB — C-REACTIVE PROTEIN: CRP: 8.7 mg/dL — ABNORMAL HIGH

## 2024-05-10 LAB — MRSA NEXT GEN BY PCR, NASAL: MRSA by PCR Next Gen: NOT DETECTED

## 2024-05-10 LAB — PRO BRAIN NATRIURETIC PEPTIDE: Pro Brain Natriuretic Peptide: 369 pg/mL — ABNORMAL HIGH

## 2024-05-10 MED ORDER — FLUCONAZOLE 100 MG PO TABS: 100.0000 mg | ORAL_TABLET | Freq: Every day | ORAL | Status: DC

## 2024-05-10 MED ORDER — CHLORHEXIDINE GLUCONATE CLOTH 2 % EX PADS
6.0000 | MEDICATED_PAD | Freq: Every day | CUTANEOUS | Status: DC
  Administered 2024-05-10 – 2024-05-18 (×9): 6 via TOPICAL

## 2024-05-10 MED ORDER — TAMSULOSIN HCL 0.4 MG PO CAPS
0.4000 mg | ORAL_CAPSULE | Freq: Every day | ORAL | Status: DC
  Administered 2024-05-10 – 2024-05-17 (×8): 0.4 mg via ORAL
  Filled 2024-05-10 (×8): qty 1

## 2024-05-10 MED ORDER — FUROSEMIDE 10 MG/ML IJ SOLN
80.0000 mg | Freq: Every day | INTRAMUSCULAR | Status: DC
  Administered 2024-05-10: 80 mg via INTRAVENOUS

## 2024-05-10 MED ORDER — NYSTATIN 100000 UNIT/GM EX POWD
Freq: Two times a day (BID) | CUTANEOUS | Status: DC
  Administered 2024-05-14: 1 via TOPICAL
  Filled 2024-05-10: qty 15

## 2024-05-10 MED ORDER — FLUCONAZOLE 150 MG PO TABS
150.0000 mg | ORAL_TABLET | Freq: Once | ORAL | Status: AC
  Administered 2024-05-10: 150 mg via ORAL
  Filled 2024-05-10: qty 1

## 2024-05-10 NOTE — Progress Notes (Addendum)
 "                                                                                                                                                                                                                                                                                PROGRESS NOTE     Patient Demographics:    [name], is a 84 y.o. female, DOB - 1941/04/13, FMW:[id number]  Outpatient Primary MD for the patient is [name], MD    LOS - 2  Admit date - 05/08/2024    Chief Complaint  Patient presents with   Shortness of Breath       Brief Narrative (HPI from H&P)    [name] is a 84 y.o. female with medical history significant of CHF, hypertension who presents emergency department with shortness of breath.  Patient is a poor historian however on arrival was found to be satting 65%.  Was placed on supplemental oxygen  with improvement.  She was endorsing a profound cough and found to be febrile.  She was recently seen by primary care doctor and given Levaquin  for presumed pneumonia.  On arrival labs were obtained which showed respiratory viral panel negative, potassium 5.2, glucose 300, WBC 12.2, hemoglobin 10.8, INR 1.1, BNP 1100, lactic acid 2.0.  Patient underwent CT chest which demonstrated pulmonary opacities.  Chest x-ray showed volume overload.  Patient was started on IV antibiotics admitted for further workup.  On evaluation patient had 3+ pitting edema and JVD.    Subjective:   Patient in bed, denies any headache chest and abdominal pain, shortness of breath slightly improved, no focal weakness.   Assessment  & Plan :   Acute hypoxic respiratory failure due to combination of CAP and acute on chronic diastolic CHF EF 60% -   Failed outpatient Levaquin  treatment, antibiotics have been escalated, also getting IV Lasix  for diuresis, echo shows an EF of about 60%  .  Continue supplemental oxygen , I-S and flutter valve for pulmonary toiletry, request cardiology to see the  patient.  For now continue antibiotics, Lasix , beta-blocker, ARB, Imdur  combination.  Follow cultures.   SpO2: 91 % O2 Flow Rate (L/min): 10 L/min  #  CAD history of bare metal stent placement in the proximal LAD, with subsequent cardiac catheterizations showing a patent stent, continue Plavix , beta-blocker and statin combination  # Generalized anxiety-continue Xanax    # Hypertension-continue amlodipine , Imdur , losartan , metoprolol    # Pancreatic insufficiency-continue Creon    # Chronic pain-continue gabapentin    # Ulcerative colitis-continue Lialda , budesonide , colestid    Patient Lines/Drains/Airways Status     Active Line/Drains/Airways     Name Placement date Placement time Site Days   Peripheral IV 05/08/24 20 G Anterior;Proximal;Right Antecubital 05/08/24  2049  Antecubital  2   Peripheral IV 05/08/24 18 G Left Antecubital 05/08/24  2000  Antecubital  2              Nutrition Problem:        Obesity: Estimated body mass index is 36.58 kg/m as calculated from the following:   Height as of 02/27/24: 5' (1.524 m).   Weight as of 02/27/24: 85 kg.          Condition - Guarded  Family Communication  : Called son Elsie [phone] - 05/10/2024 at 8:19 AM  Consults.  Cardiology   code Status : Full code      Procedures.    CT scan chest.  1. Scattered patchy pulmonary opacities, right lower lobe predominant, favoring multifocal atelectasis; superimposed right lower lobe pneumonia is possible but not favored. 2. Cardiomegaly. No interstitial edema. 3. Pulmonary arterial hypertension  Echocardiogram.  1. EF significantly improved since TTE done 04/26/22 . Left ventricular ejection fraction, by estimation, is 60 to 65%. The left ventricle has normal function. The left ventricle has no regional wall motion abnormalities. Left ventricular diastolic parameters are consistent with Grade I diastolic dysfunction (impaired relaxation). The average left ventricular  global longitudinal strain is -21.6 %. The global longitudinal strain is normal.  2. Right ventricular systolic function is normal. The right ventricular size is normal.  3. Left atrial size was mildly dilated.  4. Right atrial size was mildly dilated.  5. The mitral valve is normal in structure. No evidence of mitral valve regurgitation. No evidence of mitral stenosis.  6. The aortic valve is tricuspid. There is mild calcification of the aortic valve. Aortic valve regurgitation is trivial. Aortic valve sclerosis is present, with no evidence of aortic valve stenosis.  7. The inferior vena cava is normal in size with greater than 50% respiratory variability, suggesting right atrial pressure of 3 mmHg   Disposition Plan  :    Status is: Inpatient   DVT Prophylaxis  :    enoxaparin  (LOVENOX ) injection 40 mg Start: 05/09/24 1000 SCDs Start: 05/08/24 2300   Lab Results  Component Value Date   PLT 350 05/09/2024    Diet :  Diet Order             Diet Carb Modified Room service appropriate? Yes  Diet effective now                    Inpatient Medications  Scheduled Meds:  amLODipine   10 mg Oral Daily   atorvastatin   20 mg Oral Daily   budesonide   9 mg Oral Daily   clopidogrel   75 mg Oral Daily   colchicine   0.6 mg Oral BID   colestipol   2 g Oral BID   enoxaparin  (LOVENOX ) injection  40 mg Subcutaneous Daily   furosemide   80 mg Intravenous Daily   gabapentin   300 mg Oral BID   insulin  aspart  0-5 Units Subcutaneous QHS  insulin  aspart  0-6 Units Subcutaneous TID WC   isosorbide  mononitrate  30 mg Oral Daily   lipase/protease/amylase  24,000 Units Oral TID with meals   losartan   25 mg Oral Daily   mesalamine   2.4 g Oral BID   metoprolol  succinate  100 mg Oral Daily   morphine   30 mg Oral Q12H   pantoprazole   40 mg Oral BID   Continuous Infusions:  ceFEPime  (MAXIPIME ) IV 2 g (05/10/24 0439)   vancomycin      PRN Meds:.acetaminophen  **OR** acetaminophen , ALPRAZolam ,  HYDROcodone  bit-homatropine, ipratropium-albuterol , ondansetron  **OR** ondansetron  (ZOFRAN ) IV  Antibiotics  :    Anti-infectives (From admission, onward)    Start     Dose/Rate Route Frequency Ordered Stop   05/10/24 2330  vancomycin  (VANCOREADY) IVPB 1500 mg/300 mL       Placed in Followed by Linked Group   1,500 mg 150 mL/hr over 120 Minutes Intravenous Every 48 hours 05/08/24 2316     05/09/24 0600  ceFEPIme  (MAXIPIME ) 2 g in sodium chloride  0.9 % 100 mL IVPB        2 g 200 mL/hr over 30 Minutes Intravenous Every 8 hours 05/08/24 2316     05/08/24 2330  vancomycin  (VANCOREADY) IVPB 1750 mg/350 mL       Placed in Followed by Linked Group   1,750 mg 175 mL/hr over 120 Minutes Intravenous  Once 05/08/24 2316 05/09/24 0255   05/08/24 2130  doxycycline  (VIBRAMYCIN ) 100 mg in sodium chloride  0.9 % 250 mL IVPB  Status:  Discontinued        100 mg 125 mL/hr over 120 Minutes Intravenous  Once 05/08/24 2119 05/08/24 2137   05/08/24 2000  cefTRIAXone  (ROCEPHIN ) 2 g in sodium chloride  0.9 % 100 mL IVPB        2 g 200 mL/hr over 30 Minutes Intravenous Once 05/08/24 1956 05/08/24 2131   05/08/24 2000  azithromycin  (ZITHROMAX ) 500 mg in sodium chloride  0.9 % 250 mL IVPB  Status:  Discontinued        500 mg 250 mL/hr over 60 Minutes Intravenous  Once 05/08/24 1956 05/08/24 2119         Objective:   Vitals:   05/10/24 0019 05/10/24 0348 05/10/24 0400 05/10/24 0736  BP: 103/64 121/71 121/67 120/72  Pulse: 78 78 80 82  Resp: 19 17 18 16   Temp: 99.5 F (37.5 C) 99.5 F (37.5 C)  98.2 F (36.8 C)  TempSrc: Axillary Axillary  Oral  SpO2: 93% 93% 92% 91%    Wt Readings from Last 3 Encounters:  02/27/24 85 kg  12/17/23 95.5 kg  10/10/23 88.9 kg     Intake/Output Summary (Last 24 hours) at 05/10/2024 0833 Last data filed at 05/10/2024 0349 Gross per 24 hour  Intake 105.84 ml  Output 2200 ml  Net -2094.16 ml     Physical Exam  Awake Alert, No new F.N deficits, Normal  affect [place].AT,PERRAL Symmetrical Chest wall movement, Good air movement bilaterally, CTAB RRR,No Gallops,Rubs or new Murmurs,  +ve B.Sounds, Abd Soft, No tenderness,   No Cyanosis, Clubbing or edema     RN pressure injury documentation:      Data Review:    Recent Labs  Lab 05/08/24 2016 05/09/24 0337  WBC 12.2* 13.0*  HGB 10.8* 10.2*  HCT 33.9* 32.3*  PLT 382 350  MCV 90.6 91.5  MCH 28.9 28.9  MCHC 31.9 31.6  RDW 17.2* 17.2*  LYMPHSABS 1.3  --   MONOABS 1.0  --  EOSABS 0.1  --   BASOSABS 0.0  --     Recent Labs  Lab 05/08/24 2016 05/08/24 2107 05/09/24 0025 05/09/24 0337  NA 135  --   --  138  K 5.2*  --   --  4.1  CL 101  --   --  104  CO2 23  --   --  23  ANIONGAP 11  --   --  11  GLUCOSE 256*  --   --  244*  BUN 8  --   --  7*  CREATININE 0.89  --   --  0.74  AST 22  --   --  18  ALT 7  --   --  5  ALKPHOS 78  --   --  72  BILITOT 0.5  --   --  0.4  ALBUMIN 3.5  --   --  3.2*  LATICACIDVEN  --  2.0* 1.1  --   INR 1.1  --   --   --   CALCIUM  8.6*  --   --  8.1*      Recent Labs  Lab 05/08/24 2016 05/08/24 2107 05/09/24 0025 05/09/24 0337  LATICACIDVEN  --  2.0* 1.1  --   INR 1.1  --   --   --   CALCIUM  8.6*  --   --  8.1*    --------------------------------------------------------------------------------------------------------------- Lab Results  Component Value Date   CHOL 146 06/16/2021   HDL 58.60 06/16/2021   LDLCALC 66 06/16/2021   TRIG 104.0 06/16/2021   CHOLHDL 2 06/16/2021    Lab Results  Component Value Date   HGBA1C 6.8 (A) 10/11/2022   No results for input(s): TSH, T4TOTAL, FREET4, T3FREE, THYROIDAB in the last 72 hours. No results for input(s): VITAMINB12, FOLATE, FERRITIN, TIBC, IRON , RETICCTPCT in the last 72 hours. ------------------------------------------------------------------------------------------------------------------ Cardiac Enzymes No results for input(s): CKMB, TROPONINI,  MYOGLOBIN in the last 168 hours.  Invalid input(s): CK  Micro Results Recent Results (from the past 240 hours)  Resp panel by RT-PCR (RSV, Flu A&B, Covid) Anterior Nasal Swab     Status: None   Collection Time: 05/08/24  7:56 PM   Specimen: Anterior Nasal Swab  Result Value Ref Range Status   SARS Coronavirus 2 by RT PCR NEGATIVE NEGATIVE Final   Influenza A by PCR NEGATIVE NEGATIVE Final   Influenza B by PCR NEGATIVE NEGATIVE Final    Comment: (NOTE) The Xpert Xpress SARS-CoV-2/FLU/RSV plus assay is intended as an aid in the diagnosis of influenza from Nasopharyngeal swab specimens and should not be used as a sole basis for treatment. Nasal washings and aspirates are unacceptable for Xpert Xpress SARS-CoV-2/FLU/RSV testing.  Fact Sheet for Patients: bloggercourse.com  Fact Sheet for Healthcare Providers: seriousbroker.it  This test is not yet approved or cleared by the [place]  FDA and has been authorized for detection and/or diagnosis of SARS-CoV-2 by FDA under an Emergency Use Authorization (EUA). This EUA will remain in effect (meaning this test can be used) for the duration of the COVID-19 declaration under Section 564(b)(1) of the Act, 21 U.S.C. section 360bbb-3(b)(1), unless the authorization is terminated or revoked.     Resp Syncytial Virus by PCR NEGATIVE NEGATIVE Final    Comment: (NOTE) Fact Sheet for Patients: bloggercourse.com  Fact Sheet for Healthcare Providers: seriousbroker.it  This test is not yet approved or cleared by the [place]  FDA and has been authorized for detection and/or diagnosis of SARS-CoV-2 by FDA under an  Emergency Use Authorization (EUA). This EUA will remain in effect (meaning this test can be used) for the duration of the COVID-19 declaration under Section 564(b)(1) of the Act, 21 U.S.C. section 360bbb-3(b)(1), unless the  authorization is terminated or revoked.  Performed at [facility], [address] [place], [place] [address]   Blood Culture (routine x 2)     Status: None (Preliminary result)   Collection Time: 05/08/24  8:16 PM   Specimen: BLOOD RIGHT ARM  Result Value Ref Range Status   Specimen Description BLOOD RIGHT ARM  Final   Special Requests   Final    BOTTLES DRAWN AEROBIC AND ANAEROBIC Blood Culture results may not be optimal due to an inadequate volume of blood received in culture bottles   Culture   Final    NO GROWTH < 12 HOURS Performed at [facility], [address] [place], [place] [address]    Report Status PENDING  Incomplete  Blood Culture (routine x 2)     Status: None (Preliminary result)   Collection Time: 05/08/24  8:46 PM   Specimen: BLOOD RIGHT ARM  Result Value Ref Range Status   Specimen Description BLOOD RIGHT ARM  Final   Special Requests   Final    BOTTLES DRAWN AEROBIC AND ANAEROBIC Blood Culture results may not be optimal due to an inadequate volume of blood received in culture bottles   Culture   Final    NO GROWTH < 12 HOURS Performed at [facility], [address] [place], [place] [address]    Report Status PENDING  Incomplete  Urine Culture     Status: Abnormal   Collection Time: 05/08/24 10:58 PM   Specimen: Urine, Random  Result Value Ref Range Status   Specimen Description URINE, RANDOM  Final   Special Requests   Final    NONE Reflexed from [id number] Performed at [facility], [address] [place], [place] [address]    Culture (A)  Final    20,000 COLONIES/mL MULTIPLE SPECIES PRESENT, SUGGEST RECOLLECTION   Report Status 05/10/2024 FINAL  Final  MRSA Next Gen by PCR, Nasal     Status: None   Collection Time: 05/10/24  5:24 AM   Specimen: Nasal Mucosa; Nasal Swab  Result Value Ref Range Status   MRSA by PCR Next Gen NOT DETECTED NOT DETECTED Final    Comment: (NOTE) The GeneXpert MRSA Assay (FDA approved for NASAL  specimens only), is one component of a comprehensive MRSA colonization surveillance program. It is not intended to diagnose MRSA infection nor to guide or monitor treatment for MRSA infections. Test performance is not FDA approved in patients less than 34 years old. Performed at [facility], [address], [place], [place] [address]     Radiology Report DG Chest Port 1 View Result Date: 05/10/2024 EXAM: 1 VIEW XRAY OF THE CHEST 05/10/2024 06:32:31 AM COMPARISON: 05/08/2024 CLINICAL HISTORY: 84 year old female with shortness of breath. FINDINGS: LUNGS AND PLEURA: No significant effusions. Diffuse interstitial opacities with patchy airspace opacities. Band-like opacity in right mid lung. No pneumothorax. HEART AND MEDIASTINUM: Cardiomegaly. Aortic atherosclerotic calcifications. BONES AND SOFT TISSUES: No acute osseous abnormality. IMPRESSION: 1. Unresolved pulmonary edema with new atelectasis or trace pleural fluid along the right minor fissure. Electronically signed by: Helayne Hurst MD MD 05/10/2024 06:46 AM EST RP Workstation: [id number]   ECHOCARDIOGRAM COMPLETE Result Date: 05/09/2024    ECHOCARDIOGRAM REPORT   Patient Name:  Ritta Sammons Date of Exam: 05/09/2024 Medical Rec #:  [id number]     Height:       60.0 in Accession #:    [id number]    Weight:       187.3 lb Date of Birth:  27-Oct-1940     BSA:          1.815 m Patient Age:    83 years      BP:           144/77 mmHg Patient Gender: F             HR:           85 bpm. Exam Location:  Inpatient Procedure: 2D Echo, Cardiac Doppler, Color Doppler and Strain Analysis (Both            Spectral and Color Flow Doppler were utilized during procedure). Indications:    CHF-Acute Systolic  History:        Patient has prior history of Echocardiogram examinations, most                 recent 04/26/2022. CHF, CAD, Signs/Symptoms:Syncope; Risk                 Factors:Hypertension, Diabetes, Dyslipidemia and Former Smoker.  Sonographer:    Logan Shove  RDCS Referring Phys: [id number] [facility]  Sonographer Comments: Suboptimal parasternal window. IMPRESSIONS  1. EF significantly improved since TTE done 04/26/22 . Left ventricular ejection fraction, by estimation, is 60 to 65%. The left ventricle has normal function. The left ventricle has no regional wall motion abnormalities. Left ventricular diastolic parameters are consistent with Grade I diastolic dysfunction (impaired relaxation). The average left ventricular global longitudinal strain is -21.6 %. The global longitudinal strain is normal.  2. Right ventricular systolic function is normal. The right ventricular size is normal.  3. Left atrial size was mildly dilated.  4. Right atrial size was mildly dilated.  5. The mitral valve is normal in structure. No evidence of mitral valve regurgitation. No evidence of mitral stenosis.  6. The aortic valve is tricuspid. There is mild calcification of the aortic valve. Aortic valve regurgitation is trivial. Aortic valve sclerosis is present, with no evidence of aortic valve stenosis.  7. The inferior vena cava is normal in size with greater than 50% respiratory variability, suggesting right atrial pressure of 3 mmHg. FINDINGS  Left Ventricle: EF significantly improved since TTE done 04/26/22. Left ventricular ejection fraction, by estimation, is 60 to 65%. The left ventricle has normal function. The left ventricle has no regional wall motion abnormalities. The average left ventricular global longitudinal strain is -21.6 %. Strain was performed and the global longitudinal strain is normal. The left ventricular internal cavity size was normal in size. There is no left ventricular hypertrophy. Left ventricular diastolic parameters are consistent with Grade I diastolic dysfunction (impaired relaxation). Right Ventricle: The right ventricular size is normal. No increase in right ventricular wall thickness. Right ventricular systolic function is normal. Left Atrium: Left  atrial size was mildly dilated. Right Atrium: Right atrial size was mildly dilated. Pericardium: There is no evidence of pericardial effusion. Mitral Valve: The mitral valve is normal in structure. No evidence of mitral valve regurgitation. No evidence of mitral valve stenosis. Tricuspid Valve: The tricuspid valve is normal in structure. Tricuspid valve regurgitation is mild . No evidence of tricuspid stenosis. Aortic Valve: The aortic valve is tricuspid. There is mild calcification of the aortic valve. Aortic valve regurgitation is trivial.  Aortic valve sclerosis is present, with no evidence of aortic valve stenosis. Aortic valve peak gradient measures 5.3 mmHg. Pulmonic Valve: The pulmonic valve was normal in structure. Pulmonic valve regurgitation is mild. No evidence of pulmonic stenosis. Aorta: The aortic root is normal in size and structure. Venous: The inferior vena cava is normal in size with greater than 50% respiratory variability, suggesting right atrial pressure of 3 mmHg. IAS/Shunts: No atrial level shunt detected by color flow Doppler. Additional Comments: 3D was performed not requiring image post processing on an independent workstation and was normal.  LEFT VENTRICLE PLAX 2D LVIDd:         5.00 cm      Diastology LVIDs:         2.30 cm      LV e' medial:    5.66 cm/s LV PW:         0.90 cm      LV E/e' medial:  13.7 LV IVS:        1.00 cm      LV e' lateral:   7.72 cm/s LVOT diam:     2.20 cm      LV E/e' lateral: 10.0 LVOT Area:     3.80 cm LV IVRT:       106 msec     2D Longitudinal Strain                             2D Strain GLS Avg:     -21.6 %  LV Volumes (MOD) LV vol d, MOD A2C: 118.0 ml LV vol d, MOD A4C: 94.6 ml LV vol s, MOD A2C: 41.8 ml LV vol s, MOD A4C: 29.3 ml LV SV MOD A2C:     76.2 ml LV SV MOD A4C:     94.6 ml LV SV MOD BP:      72.0 ml RIGHT VENTRICLE             IVC RV Basal diam:  4.50 cm     IVC diam: 1.20 cm RV Mid diam:    3.20 cm RV S prime:     12.30 cm/s TAPSE (M-mode): 2.7  cm LEFT ATRIUM             Index        RIGHT ATRIUM           Index LA diam:        3.80 cm 2.09 cm/m   RA Area:     26.70 cm LA Vol (A2C):   84.6 ml 46.60 ml/m  RA Volume:   93.00 ml  51.23 ml/m LA Vol (A4C):   42.9 ml 23.63 ml/m LA Biplane Vol: 64.3 ml 35.42 ml/m  AORTIC VALVE AV Area (Vmax): 4.07 cm AV Vmax:        115.00 cm/s AV Peak Grad:   5.3 mmHg LVOT Vmax:      123.00 cm/s  AORTA Ao Root diam: 2.50 cm Ao Asc diam:  3.70 cm MITRAL VALVE MV Area (PHT): 3.48 cm     SHUNTS MV Decel Time: 218 msec     Systemic Diam: 2.20 cm MV E velocity: 77.50 cm/s MV A velocity: 103.00 cm/s MV E/A ratio:  0.75 Maude Emmer MD Electronically signed by Maude Emmer MD Signature Date/Time: 05/09/2024/4:22:04 PM    Final    CT Chest Wo Contrast Result Date: 05/08/2024 EXAM: CT CHEST WITHOUT CONTRAST 05/08/2024 09:55:00 PM TECHNIQUE: CT  of the chest was performed without the administration of intravenous contrast. Multiplanar reformatted images are provided for review. Automated exposure control, iterative reconstruction, and/or weight based adjustment of the mA/kV was utilized to reduce the radiation dose to as low as reasonably achievable. COMPARISON: Chest radiograph earlier today and CTA chest dated 08/03/2021. CLINICAL HISTORY: Cough, chronic/persisting > 8 weeks, failed empiric treatment. FINDINGS: MEDIASTINUM: Cardiomegaly. Severe 3-vessel coronary atherosclerosis. Enlargement of the main pulmonary artery, suggesting pulmonary arterial hypertension. Mild thoracic aortic atherosclerosis. The pericardium is unremarkable. The central airways are clear. LYMPH NODES: No mediastinal, hilar or axillary lymphadenopathy. LUNGS AND PLEURA: Scattered patchy opacities in the lungs bilaterally, right lower lobe predominant, suggesting multifocal atelectasis. Superimposed right lower lobe pneumonia is possible but not favored. Bilateral lower lobe bronchiectasis. No frank interstitial edema. No pleural effusion or pneumothorax.  SOFT TISSUES/BONES: No acute abnormality of the bones or soft tissues. UPPER ABDOMEN: Limited images of the upper abdomen demonstrates no acute abnormality. IMPRESSION: 1. Scattered patchy pulmonary opacities, right lower lobe predominant, favoring multifocal atelectasis; superimposed right lower lobe pneumonia is possible but not favored. 2. Cardiomegaly. No interstitial edema. 3. Pulmonary arterial hypertension. Electronically signed by: Pinkie Pebbles MD MD 05/08/2024 10:02 PM EST RP Workstation: [id number]   DG Chest Port 1 View Result Date: 05/08/2024 CLINICAL DATA:  Sepsis, short of breath EXAM: PORTABLE CHEST 1 VIEW COMPARISON:  02/22/2022 FINDINGS: Single frontal view of the chest demonstrates an enlarged cardiac silhouette. There is pulmonary vascular congestion with bilateral perihilar airspace disease and small right pleural effusion, consistent with congestive heart failure. No pneumothorax. No acute bony abnormalities. IMPRESSION: 1. Congestive heart failure. Electronically Signed   By: Ozell Daring [name]   On: 05/08/2024 21:13     Signature  -   Lavada Stank DO on 05/10/2024 at 8:33 AM   -  To page go to [email or web address]   "

## 2024-05-10 NOTE — Evaluation (Signed)
 Physical Therapy Evaluation Patient Details Name: [name] MRN: [id number] DOB: [birth year] Today's Date: 05/10/2024  History of Present Illness  [name] is a 84 y.o. female who presents emergency department with shortness of breath.  Patient is a poor historian however on arrival was found to be satting 65%.  Was placed on supplemental oxygen  with improvement.  She was endorsing a profound cough and found to be febrile.  She was recently seen by primary care doctor and given Levaquin  for presumed pneumonia; with medical history significant of CHF, hypertension  Clinical Impression   Pt admitted with above diagnosis. Lives at home with son, in a single-level home with a few steps to enter; Prior to admission, pt was able to at least manage in home amb, states without assistive device; Presents to PT with significant generalized weakness and decr activity tolerance; Needed 2 person assist to come to sitting EOB, give support in sitting EOB, and to lay back down to supine;  Marked decr activity tolerance, with elevated HR throughout session; Hopeful that pt will make considerable progress as she improves medically; if slow progress, must consider post-acute rehab; Pt currently with functional limitations due to the deficits listed below (see PT Problem List). Pt will benefit from skilled PT to increase their independence and safety with mobility to allow discharge to the venue listed below.           If plan is discharge home, recommend the following: A lot of help with walking and/or transfers;A lot of help with bathing/dressing/bathroom;Help with stairs or ramp for entrance;Assist for transportation   Can travel by private vehicle   No    Equipment Recommendations Rolling walker (2 wheels);BSC/3in1;Wheelchair (measurements PT);Wheelchair cushion (measurements PT)  Recommendations for Other Services  OT consult    Functional Status Assessment Patient has had a recent decline in their  functional status and demonstrates the ability to make significant improvements in function in a reasonable and predictable amount of time.     Precautions / Restrictions Precautions Precautions: Fall;Other (comment) Recall of Precautions/Restrictions: Impaired Precaution/Restrictions Comments: watch O2 sats Restrictions Weight Bearing Restrictions Per Provider Order: No      Mobility  Bed Mobility Overal bed mobility: Needs Assistance Bed Mobility: Supine to Sit, Sit to Supine     Supine to sit: +2 for physical assistance, Max assist Sit to supine: +2 for physical assistance, Max assist   General bed mobility comments: 2 person Max assist for all aspects of bed moiblity    Transfers                        Ambulation/Gait                  Stairs            Wheelchair Mobility     Tilt Bed    Modified Rankin (Stroke Patients Only)       Balance Overall balance assessment: Needs assistance   Sitting balance-Leahy Scale: Zero Sitting balance - Comments: Constant suport at back while sitting EOB Postural control: Posterior lean                                   Pertinent Vitals/Pain Pain Assessment Pain Assessment: No/denies pain    Home Living Family/patient expects to be discharged to:: Private residence Living Arrangements: Children Available Help at Discharge: Family;Available PRN/intermittently (near 24 hour?\) Type  of Home: House Home Access: Ramped entrance       Home Layout: One level Home Equipment: Shower seat (Possibly rollator)      Prior Function Prior Level of Function : Independent/Modified Independent             Mobility Comments: Uses rollator ADLs Comments: Reports independence with BADLs     Extremity/Trunk Assessment   Upper Extremity Assessment Upper Extremity Assessment: Defer to OT evaluation    Lower Extremity Assessment Lower Extremity Assessment: Generalized weakness        Communication   Communication Communication: Impaired Factors Affecting Communication: Reduced clarity of speech    Cognition Arousal: Lethargic Behavior During Therapy: Flat affect   PT - Cognitive impairments: Awareness, Attention, Initiation, Sequencing, Problem solving, Safety/Judgement                       PT - Cognition Comments: Pt takes long pauses to answer, occasionally needing to be re-awakened throughout session Following commands: Impaired Following commands impaired: Follows one step commands with increased time     Cueing Cueing Techniques: Verbal cues, Tactile cues     General Comments General comments (skin integrity, edema, etc.): Noting BP very high while sitting EOB -- RN notified and joined us  in room; Unsure that it isn't just artefact; once supine, BP returned to comparable to earlier supine BPs; HR tachy during session, 125bpm with minimal activity    Exercises     Assessment/Plan    PT Assessment Patient needs continued PT services  PT Problem List Decreased strength;Decreased range of motion;Decreased activity tolerance;Decreased balance;Decreased mobility;Decreased coordination;Decreased cognition;Decreased knowledge of use of DME;Decreased safety awareness;Decreased knowledge of precautions;Cardiopulmonary status limiting activity       PT Treatment Interventions DME instruction;Gait training;Therapeutic activities;Functional mobility training;Therapeutic exercise;Balance training;Neuromuscular re-education;Cognitive remediation;Patient/family education;Wheelchair mobility training;Manual techniques;Modalities    PT Goals (Current goals can be found in the Care Plan section)  Acute Rehab PT Goals Patient Stated Goal: Did not state PT Goal Formulation: Patient unable to participate in goal setting Time For Goal Achievement: 05/24/24 Potential to Achieve Goals: Fair    Frequency Min 2X/week     Co-evaluation                AM-PAC PT 6 Clicks Mobility  Outcome Measure Help needed turning from your back to your side while in a flat bed without using bedrails?: Total Help needed moving from lying on your back to sitting on the side of a flat bed without using bedrails?: Total Help needed moving to and from a bed to a chair (including a wheelchair)?: Total Help needed standing up from a chair using your arms (e.g., wheelchair or bedside chair)?: Total Help needed to walk in hospital room?: Total Help needed climbing 3-5 steps with a railing? : Total 6 Click Score: 6    End of Session Equipment Utilized During Treatment: Oxygen ;Other (comment) (bed pads) Activity Tolerance: Patient limited by fatigue;Patient limited by lethargy Patient left: in bed;with call bell/phone within reach;with bed alarm set Nurse Communication: Mobility status PT Visit Diagnosis: Muscle weakness (generalized) (M62.81);Adult, failure to thrive (R62.7);Difficulty in walking, not elsewhere classified (R26.2)    Time: 8444-8380 PT Time Calculation (min) (ACUTE ONLY): 24 min   Charges:   PT Evaluation $PT Eval Moderate Complexity: 1 Mod PT Treatments $Therapeutic Activity: 8-22 mins PT General Charges $$ ACUTE PT VISIT: 1 Visit         Silvano Currier, PT  Acute Rehabilitation Services Office  [phone] Secure Chat welcomed   Silvano VEAR Currier 05/10/2024, 6:05 PM

## 2024-05-10 NOTE — Consult Note (Signed)
 Reason for Consult:vaginal discharge and itching Referring Physician: Dr. [name] [name] is an 84 y.o. female. [id number] S/p TAH for benign disease hospitalized for SOB, hypoxia treated for pneumonia and CHF. She has been on antibiotics for several days and is noted to have vaginal discharge and itching Pertinent Gynecological History: Menses: post-menopausal Contraception: status post hysterectomy Blood transfusions: none Sexually transmitted diseases: no past history Previous GYN Procedures: TAH    Menstrual History:  No LMP recorded (lmp unknown). Patient has had a hysterectomy.    Past Medical History:  Diagnosis Date   Adenomatous colon polyp 02/2006   Allergy    Allergy, unspecified not elsewhere classified    Anemia    Anxiety    Arthritis    Asthma    Blood transfusion without reported diagnosis    Bronchiectasis    CAD (coronary artery disease)    BMS to the LAD, 2002; cath 12/07/11 patent LAD stent and mild nonobstructive disease, EF 65%; Medical management   Cataract    removed both eyes   CHF (congestive heart failure) (HCC)    Diastolic   Chronic diastolic CHF (congestive heart failure) (HCC) 11/25/2011   Clotting disorder    in legs per pt    Diabetes mellitus    Diverticulosis of colon    DJD (degenerative joint disease)    Fibromyalgia    GERD (gastroesophageal reflux disease)    H. pylori infection 05/12/2019   Hypercholesterolemia    Hypertension    Microscopic hematuria    Neoplasm of kidney    Obesity    Other diseases of lung, not elsewhere classified    Pinched vertebral nerve    Pulmonary nodule    Negative PET in 2010   Stress incontinence, female    Syncope    Ulcerative colitis, left sided (HCC) 2008   Hx of   UTI (lower urinary tract infection)    Vitamin D  deficiency disease     Past Surgical History:  Procedure Laterality Date   ABDOMINAL HYSTERECTOMY     CARDIAC CATHETERIZATION  11/2011   CATARACT EXTRACTION, BILATERAL   2020   CHOLECYSTECTOMY     COLONOSCOPY  01/09/2018   per Dr. Aneita, adenomatous polyps, repeat in 3 yrs   CORONARY STENT PLACEMENT  2002   BMS to the LAD   ESOPHAGOGASTRODUODENOSCOPY  12/2008   HAND SURGERY  01/2006   Right Hand Surgery by Dr. Camella   LEFT HEART CATHETERIZATION WITH CORONARY ANGIOGRAM N/A 12/07/2011   Procedure: LEFT HEART CATHETERIZATION WITH CORONARY ANGIOGRAM;  Surgeon: Peter M Jordan, MD;  Location: [facility] CATH LAB;  Service: Cardiovascular;  Laterality: N/A;   RIGHT/LEFT HEART CATH AND CORONARY ANGIOGRAPHY N/A 08/07/2021   Procedure: RIGHT/LEFT HEART CATH AND CORONARY ANGIOGRAPHY;  Surgeon: Cherrie Toribio SAUNDERS, MD;  Location: [facility] INVASIVE CV LAB;  Service: Cardiovascular;  Laterality: N/A;   UPPER GASTROINTESTINAL ENDOSCOPY      Family History  Problem Relation Age of Onset   Pneumonia Father    Heart attack Father    Colon polyps Father    Parkinsonism Mother    Diabetes Brother    Sarcoidosis Brother    Hypertension Sister    Diabetes Sister    Breast cancer Daughter    Colon cancer Neg Hx    Esophageal cancer Neg Hx    Rectal cancer Neg Hx    Stomach cancer Neg Hx     Social History:  reports that she quit smoking about 51 years ago. Her  smoking use included cigarettes. She started smoking about 66 years ago. She has a 30 pack-year smoking history. She has been exposed to tobacco smoke. She has never used smokeless tobacco. She reports that she does not currently use alcohol . She reports that she does not use drugs.  Allergies: Allergies[1]  Medications: I have reviewed the patient's current medications.  Review of Systems  Genitourinary:  Positive for vaginal discharge. Negative for difficulty urinating, frequency and pelvic pain.    Blood pressure 120/72, pulse 82, temperature 98.2 F (36.8 C), temperature source Oral, resp. rate 16, SpO2 91%. Physical Exam Vitals and nursing note reviewed. Exam conducted with a chaperone present.  Constitutional:       Appearance: She is obese. She is ill-appearing.  HENT:     Head: Normocephalic and atraumatic.  Pulmonary:     Effort: Pulmonary effort is normal.  Genitourinary:    General: Normal vulva.     Comments: Atrophy no lesion discharge not present, s/p cleaning by the nurse Skin:    General: Skin is warm and dry.  Neurological:     Mental Status: She is alert.  Psychiatric:        Mood and Affect: Mood normal.        Behavior: Behavior normal.     Results for orders placed or performed during the hospital encounter of 05/08/24 (from the past 48 hours)  Resp panel by RT-PCR (RSV, Flu A&B, Covid) Anterior Nasal Swab     Status: None   Collection Time: 05/08/24  7:56 PM   Specimen: Anterior Nasal Swab  Result Value Ref Range   SARS Coronavirus 2 by RT PCR NEGATIVE NEGATIVE   Influenza A by PCR NEGATIVE NEGATIVE   Influenza B by PCR NEGATIVE NEGATIVE    Comment: (NOTE) The Xpert Xpress SARS-CoV-2/FLU/RSV plus assay is intended as an aid in the diagnosis of influenza from Nasopharyngeal swab specimens and should not be used as a sole basis for treatment. Nasal washings and aspirates are unacceptable for Xpert Xpress SARS-CoV-2/FLU/RSV testing.  Fact Sheet for Patients: bloggercourse.com  Fact Sheet for Healthcare Providers: seriousbroker.it  This test is not yet approved or cleared by the [place]  FDA and has been authorized for detection and/or diagnosis of SARS-CoV-2 by FDA under an Emergency Use Authorization (EUA). This EUA will remain in effect (meaning this test can be used) for the duration of the COVID-19 declaration under Section 564(b)(1) of the Act, 21 U.S.C. section 360bbb-3(b)(1), unless the authorization is terminated or revoked.     Resp Syncytial Virus by PCR NEGATIVE NEGATIVE    Comment: (NOTE) Fact Sheet for Patients: bloggercourse.com  Fact Sheet for Healthcare  Providers: seriousbroker.it  This test is not yet approved or cleared by the [place]  FDA and has been authorized for detection and/or diagnosis of SARS-CoV-2 by FDA under an Emergency Use Authorization (EUA). This EUA will remain in effect (meaning this test can be used) for the duration of the COVID-19 declaration under Section 564(b)(1) of the Act, 21 U.S.C. section 360bbb-3(b)(1), unless the authorization is terminated or revoked.  Performed at [facility], [address], [place], [place] [address]   Comprehensive metabolic panel     Status: Abnormal   Collection Time: 05/08/24  8:16 PM  Result Value Ref Range   Sodium 135 135 - 145 mmol/L   Potassium 5.2 (H) 3.5 - 5.1 mmol/L    Comment: HEMOLYSIS AT THIS LEVEL MAY AFFECT RESULT   Chloride 101 98 - 111  mmol/L   CO2 23 22 - 32 mmol/L   Glucose, Bld 256 (H) 70 - 99 mg/dL    Comment: Glucose reference range applies only to samples taken after fasting for at least 8 hours.   BUN 8 8 - 23 mg/dL   Creatinine, Ser 9.10 0.44 - 1.00 mg/dL   Calcium  8.6 (L) 8.9 - 10.3 mg/dL   Total Protein 6.7 6.5 - 8.1 g/dL   Albumin 3.5 3.5 - 5.0 g/dL   AST 22 15 - 41 U/L    Comment: HEMOLYSIS AT THIS LEVEL MAY AFFECT RESULT   ALT 7 0 - 44 U/L   Alkaline Phosphatase 78 38 - 126 U/L   Total Bilirubin 0.5 0.0 - 1.2 mg/dL   GFR, Estimated >39 >39 mL/min    Comment: (NOTE) Calculated using the CKD-EPI Creatinine Equation (2021)    Anion gap 11 5 - 15    Comment: Performed at [facility], [address], [place], [place] [address]  CBC with Differential     Status: Abnormal   Collection Time: 05/08/24  8:16 PM  Result Value Ref Range   WBC 12.2 (H) 4.0 - 10.5 K/uL   RBC 3.74 (L) 3.87 - 5.11 MIL/uL   Hemoglobin 10.8 (L) 12.0 - 15.0 g/dL   HCT 66.0 (L) 63.9 - 53.9 %   MCV 90.6 80.0 - 100.0 fL   MCH 28.9 26.0 - 34.0 pg   MCHC 31.9 30.0 - 36.0 g/dL   RDW 82.7 (H) 88.4 - 84.4 %   Platelets 382 150 -  400 K/uL   nRBC 0.0 0.0 - 0.2 %   Neutrophils Relative % 79 %   Neutro Abs 9.6 (H) 1.7 - 7.7 K/uL   Lymphocytes Relative 10 %   Lymphs Abs 1.3 0.7 - 4.0 K/uL   Monocytes Relative 9 %   Monocytes Absolute 1.0 0.1 - 1.0 K/uL   Eosinophils Relative 1 %   Eosinophils Absolute 0.1 0.0 - 0.5 K/uL   Basophils Relative 0 %   Basophils Absolute 0.0 0.0 - 0.1 K/uL   Immature Granulocytes 1 %   Abs Immature Granulocytes 0.12 (H) 0.00 - 0.07 K/uL    Comment: Performed at [facility], [address], [place], [place] [address]  Protime-INR     Status: None   Collection Time: 05/08/24  8:16 PM  Result Value Ref Range   Prothrombin Time 15.0 11.4 - 15.2 seconds   INR 1.1 0.8 - 1.2    Comment: (NOTE) INR goal varies based on device and disease states. Performed at [facility], [address] [place], [place] [address]   Blood Culture (routine x 2)     Status: None (Preliminary result)   Collection Time: 05/08/24  8:16 PM   Specimen: BLOOD RIGHT ARM  Result Value Ref Range   Specimen Description BLOOD RIGHT ARM    Special Requests      BOTTLES DRAWN AEROBIC AND ANAEROBIC Blood Culture results may not be optimal due to an inadequate volume of blood received in culture bottles   Culture      NO GROWTH 2 DAYS Performed at [facility], [address] [place], [place] [address]    Report Status PENDING   Pro Brain natriuretic peptide     Status: Abnormal   Collection Time: 05/08/24  8:16 PM  Result Value Ref Range   Pro Brain Natriuretic Peptide 1,166.0 (H) <300.0 pg/mL    Comment: (NOTE)  Age Group        Cut-Points    Interpretation  < 50 years     450 pg/mL       NT-proBNP > 450 pg/mL indicates                                ADHF is likely              50 to 75 years  900 pg/mL      NT-proBNP > 900 pg/mL indicates          ADHF is likely  > 75 years      1800 pg/mL     NT-proBNP > 1800 pg/mL indicates          ADHF is likely                           All ages     Results between       Indeterminate. Further clinical             300 and the cut-   information is needed to determine            point for age group   if ADHF is present.                                                             Elecsys proBNP II/ Elecsys proBNP II STAT           Cut-Point                       Interpretation  300 pg/mL                    NT-proBNP <300pg/mL indicates                             ADHF is not likely  Performed at [facility], [address] [place], [place] [address]   Blood Culture (routine x 2)     Status: None (Preliminary result)   Collection Time: 05/08/24  8:46 PM   Specimen: BLOOD RIGHT ARM  Result Value Ref Range   Specimen Description BLOOD RIGHT ARM    Special Requests      BOTTLES DRAWN AEROBIC AND ANAEROBIC Blood Culture results may not be optimal due to an inadequate volume of blood received in culture bottles   Culture      NO GROWTH 2 DAYS Performed at [facility], [address], [place], [place] [address]    Report Status PENDING   I-Stat Lactic Acid, ED     Status: Abnormal   Collection Time: 05/08/24  9:07 PM  Result Value Ref Range   Lactic Acid, Venous 2.0 (HH) 0.5 - 1.9 mmol/L  Urinalysis, w/ Reflex to Culture (Infection Suspected) -Urine, Clean Catch     Status: Abnormal   Collection Time: 05/08/24 10:58 PM  Result Value Ref Range   Specimen Source URINE, CLEAN CATCH    Color, Urine YELLOW YELLOW   APPearance HAZY (A) CLEAR   Specific Gravity, Urine 1.014 1.005 -  1.030   pH 5.0 5.0 - 8.0   Glucose, UA >=500 (A) NEGATIVE mg/dL   Hgb urine dipstick SMALL (A) NEGATIVE   Bilirubin Urine NEGATIVE NEGATIVE   Ketones, ur NEGATIVE NEGATIVE mg/dL   Protein, ur NEGATIVE NEGATIVE mg/dL   Nitrite NEGATIVE NEGATIVE   Leukocytes,Ua LARGE (A) NEGATIVE   RBC / HPF 0-5 0 - 5 RBC/hpf   WBC, UA 11-20 0 - 5 WBC/hpf    Comment:        Reflex urine culture not performed if WBC <=10, OR if Squamous  epithelial cells >5. If Squamous epithelial cells >5 suggest recollection.    Bacteria, UA RARE (A) NONE SEEN   Squamous Epithelial / HPF 0-5 0 - 5 /HPF    Comment: Performed at [facility], [address] [place], [place] [address]  Urine Culture     Status: Abnormal   Collection Time: 05/08/24 10:58 PM   Specimen: Urine, Random  Result Value Ref Range   Specimen Description URINE, RANDOM    Special Requests      NONE Reflexed from [id number] Performed at [facility], [address] [place], [place] [address]    Culture (A)     20,000 COLONIES/mL MULTIPLE SPECIES PRESENT, SUGGEST RECOLLECTION   Report Status 05/10/2024 FINAL   I-Stat Lactic Acid, ED     Status: None   Collection Time: 05/09/24 12:25 AM  Result Value Ref Range   Lactic Acid, Venous 1.1 0.5 - 1.9 mmol/L  CBC     Status: Abnormal   Collection Time: 05/09/24  3:37 AM  Result Value Ref Range   WBC 13.0 (H) 4.0 - 10.5 K/uL   RBC 3.53 (L) 3.87 - 5.11 MIL/uL   Hemoglobin 10.2 (L) 12.0 - 15.0 g/dL   HCT 67.6 (L) 63.9 - 53.9 %   MCV 91.5 80.0 - 100.0 fL   MCH 28.9 26.0 - 34.0 pg   MCHC 31.6 30.0 - 36.0 g/dL   RDW 82.7 (H) 88.4 - 84.4 %   Platelets 350 150 - 400 K/uL   nRBC 0.0 0.0 - 0.2 %    Comment: Performed at [facility], [address], [place], [place] [address]  Comprehensive metabolic panel     Status: Abnormal   Collection Time: 05/09/24  3:37 AM  Result Value Ref Range   Sodium 138 135 - 145 mmol/L   Potassium 4.1 3.5 - 5.1 mmol/L    Comment: [place] check noted    Chloride 104 98 - 111 mmol/L   CO2 23 22 - 32 mmol/L   Glucose, Bld 244 (H) 70 - 99 mg/dL    Comment: Glucose reference range applies only to samples taken after fasting for at least 8 hours.   BUN 7 (L) 8 - 23 mg/dL   Creatinine, Ser 9.25 0.44 - 1.00 mg/dL   Calcium  8.1 (L) 8.9 - 10.3 mg/dL   Total Protein 6.2 (L) 6.5 - 8.1 g/dL   Albumin 3.2 (L) 3.5 - 5.0 g/dL   AST 18 15 - 41 U/L   ALT 5 0 - 44 U/L   Alkaline  Phosphatase 72 38 - 126 U/L   Total Bilirubin 0.4 0.0 - 1.2 mg/dL   GFR, Estimated >39 >39 mL/min    Comment: (NOTE) Calculated using the CKD-EPI Creatinine Equation (2021)    Anion gap 11 5 - 15    Comment: Performed at [facility], [address], [place], [place] [address]  Glucose, capillary     Status: Abnormal   Collection Time: 05/09/24  8:55 PM  Result Value Ref Range   Glucose-Capillary 293 (H) 70 - 99 mg/dL    Comment: Glucose reference range applies only to samples taken after fasting for at least 8 hours.   Comment 1 Notify RN    Comment 2 Document in Chart   MRSA Next Gen by PCR, Nasal     Status: None   Collection Time: 05/10/24  5:24 AM   Specimen: Nasal Mucosa; Nasal Swab  Result Value Ref Range   MRSA by PCR Next Gen NOT DETECTED NOT DETECTED    Comment: (NOTE) The GeneXpert MRSA Assay (FDA approved for NASAL specimens only), is one component of a comprehensive MRSA colonization surveillance program. It is not intended to diagnose MRSA infection nor to guide or monitor treatment for MRSA infections. Test performance is not FDA approved in patients less than 50 years old. Performed at [facility], [address] [place], [place] [address]   Glucose, capillary     Status: Abnormal   Collection Time: 05/10/24  7:46 AM  Result Value Ref Range   Glucose-Capillary 197 (H) 70 - 99 mg/dL    Comment: Glucose reference range applies only to samples taken after fasting for at least 8 hours.  Basic metabolic panel with GFR     Status: Abnormal   Collection Time: 05/10/24  9:34 AM  Result Value Ref Range   Sodium 135 135 - 145 mmol/L   Potassium 3.7 3.5 - 5.1 mmol/L   Chloride 99 98 - 111 mmol/L   CO2 24 22 - 32 mmol/L   Glucose, Bld 221 (H) 70 - 99 mg/dL    Comment: Glucose reference range applies only to samples taken after fasting for at least 8 hours.   BUN 14 8 - 23 mg/dL   Creatinine, Ser 9.24 0.44 - 1.00 mg/dL   Calcium  7.9 (L) 8.9 - 10.3 mg/dL    GFR, Estimated >39 >39 mL/min    Comment: (NOTE) Calculated using the CKD-EPI Creatinine Equation (2021)    Anion gap 13 5 - 15    Comment: Performed at [facility], [address], [place], [place] [address]  C-reactive protein     Status: Abnormal   Collection Time: 05/10/24  9:34 AM  Result Value Ref Range   CRP 8.7 (H) <1.0 mg/dL    Comment: Performed at [facility], [address], [place], [place] [address]  Pro Brain natriuretic peptide     Status: Abnormal   Collection Time: 05/10/24  9:34 AM  Result Value Ref Range   Pro Brain Natriuretic Peptide 369.0 (H) <300.0 pg/mL    Comment: (NOTE) Age Group        Cut-Points    Interpretation  < 50 years     450 pg/mL       NT-proBNP > 450 pg/mL indicates                                ADHF is likely              50 to 75 years  900 pg/mL      NT-proBNP > 900 pg/mL indicates          ADHF is likely  > 75 years      1800 pg/mL     NT-proBNP > 1800 pg/mL  indicates          ADHF is likely                           All ages    Results between       Indeterminate. Further clinical             300 and the cut-   information is needed to determine            point for age group   if ADHF is present.                                                             Elecsys proBNP II/ Elecsys proBNP II STAT           Cut-Point                       Interpretation  300 pg/mL                    NT-proBNP <300pg/mL indicates                             ADHF is not likely  Performed at [facility], [address] [place], [place] [address]   Procalcitonin     Status: None   Collection Time: 05/10/24  9:34 AM  Result Value Ref Range   Procalcitonin 0.10 ng/mL    Comment: (NOTE)   Sepsis PCT Algorithm          Lower Respiratory Tract Infection                                         PCT Algorithm -----------------------------------------------------------------  <0.5 ng/mL                    <0.10 ng/mL   Associated with low           Antibiotic therapy strongly   risk for progression          discouraged. Indicates absence   to severe sepsis              of bacteria infection  and/or septic shock             --------------------------------------------------------------  0.5-2.0 ng/mL                 0.10-0.25 ng/mL  Recommended to retest         Antibiotic therapy discouraged.  PCT within 6-24 hours         Bacterial infection unlikely  ------------------------------------------------------------  >2 ng/mL                      0.26-0.50 ng/mL  Associated with high risk     Antibiotic therapy encouraged.  for progression to severe     Bacterial infection possible  sepsis/and or septic shock    ------------------------------                                 >  0.50 ng/mL                                Antibiotic therapy strongly                                 encouraged.                                Suggestive of presence of                                 bacterial infection.                                 -------------------------------------------------------------------  < or = 0.50 ng/mL OR          < or = 0.25 OR 80% decrease in PCT  80% decrease in PCT           Antibiotic therapy   Antibiotic therapy may        may be discontinued  be discontinued                                 Performed at [facility], [address] [place], [place] [address]    *Note: Due to a large number of results and/or encounters for the requested time period, some results have not been displayed. A complete set of results can be found in Results Review.    DG Chest Port 1 View Result Date: 05/10/2024 EXAM: 1 VIEW XRAY OF THE CHEST 05/10/2024 06:32:31 AM COMPARISON: 05/08/2024 CLINICAL HISTORY: 84 year old female with shortness of breath. FINDINGS: LUNGS AND PLEURA: No significant effusions. Diffuse interstitial opacities with patchy airspace opacities. Band-like opacity in right mid lung.  No pneumothorax. HEART AND MEDIASTINUM: Cardiomegaly. Aortic atherosclerotic calcifications. BONES AND SOFT TISSUES: No acute osseous abnormality. IMPRESSION: 1. Unresolved pulmonary edema with new atelectasis or trace pleural fluid along the right minor fissure. Electronically signed by: Helayne Hurst MD MD 05/10/2024 06:46 AM EST RP Workstation: [id number]   ECHOCARDIOGRAM COMPLETE Result Date: 05/09/2024    ECHOCARDIOGRAM REPORT   Patient Name:   [name] Date of Exam: 05/09/2024 Medical Rec #:  [id number]     Height:       60.0 in Accession #:    [id number]    Weight:       187.3 lb Date of Birth:  16-Nov-1940     BSA:          1.815 m Patient Age:    83 years      BP:           144/77 mmHg Patient Gender: F             HR:           85 bpm. Exam Location:  Inpatient Procedure: 2D Echo, Cardiac Doppler, Color Doppler and Strain Analysis (Both            Spectral and Color Flow Doppler were utilized during procedure). Indications:    CHF-Acute Systolic  History:        Patient has prior history of  Echocardiogram examinations, most                 recent 04/26/2022. CHF, CAD, Signs/Symptoms:Syncope; Risk                 Factors:Hypertension, Diabetes, Dyslipidemia and Former Smoker.  Sonographer:    Logan Shove RDCS Referring Phys: [id number] [facility]  Sonographer Comments: Suboptimal parasternal window. IMPRESSIONS  1. EF significantly improved since TTE done 04/26/22 . Left ventricular ejection fraction, by estimation, is 60 to 65%. The left ventricle has normal function. The left ventricle has no regional wall motion abnormalities. Left ventricular diastolic parameters are consistent with Grade I diastolic dysfunction (impaired relaxation). The average left ventricular global longitudinal strain is -21.6 %. The global longitudinal strain is normal.  2. Right ventricular systolic function is normal. The right ventricular size is normal.  3. Left atrial size was mildly dilated.  4. Right atrial size was  mildly dilated.  5. The mitral valve is normal in structure. No evidence of mitral valve regurgitation. No evidence of mitral stenosis.  6. The aortic valve is tricuspid. There is mild calcification of the aortic valve. Aortic valve regurgitation is trivial. Aortic valve sclerosis is present, with no evidence of aortic valve stenosis.  7. The inferior vena cava is normal in size with greater than 50% respiratory variability, suggesting right atrial pressure of 3 mmHg. FINDINGS  Left Ventricle: EF significantly improved since TTE done 04/26/22. Left ventricular ejection fraction, by estimation, is 60 to 65%. The left ventricle has normal function. The left ventricle has no regional wall motion abnormalities. The average left ventricular global longitudinal strain is -21.6 %. Strain was performed and the global longitudinal strain is normal. The left ventricular internal cavity size was normal in size. There is no left ventricular hypertrophy. Left ventricular diastolic parameters are consistent with Grade I diastolic dysfunction (impaired relaxation). Right Ventricle: The right ventricular size is normal. No increase in right ventricular wall thickness. Right ventricular systolic function is normal. Left Atrium: Left atrial size was mildly dilated. Right Atrium: Right atrial size was mildly dilated. Pericardium: There is no evidence of pericardial effusion. Mitral Valve: The mitral valve is normal in structure. No evidence of mitral valve regurgitation. No evidence of mitral valve stenosis. Tricuspid Valve: The tricuspid valve is normal in structure. Tricuspid valve regurgitation is mild . No evidence of tricuspid stenosis. Aortic Valve: The aortic valve is tricuspid. There is mild calcification of the aortic valve. Aortic valve regurgitation is trivial. Aortic valve sclerosis is present, with no evidence of aortic valve stenosis. Aortic valve peak gradient measures 5.3 mmHg. Pulmonic Valve: The pulmonic valve was  normal in structure. Pulmonic valve regurgitation is mild. No evidence of pulmonic stenosis. Aorta: The aortic root is normal in size and structure. Venous: The inferior vena cava is normal in size with greater than 50% respiratory variability, suggesting right atrial pressure of 3 mmHg. IAS/Shunts: No atrial level shunt detected by color flow Doppler. Additional Comments: 3D was performed not requiring image post processing on an independent workstation and was normal.  LEFT VENTRICLE PLAX 2D LVIDd:         5.00 cm      Diastology LVIDs:         2.30 cm      LV e' medial:    5.66 cm/s LV PW:         0.90 cm      LV E/e' medial:  13.7 LV IVS:  1.00 cm      LV e' lateral:   7.72 cm/s LVOT diam:     2.20 cm      LV E/e' lateral: 10.0 LVOT Area:     3.80 cm LV IVRT:       106 msec     2D Longitudinal Strain                             2D Strain GLS Avg:     -21.6 %  LV Volumes (MOD) LV vol d, MOD A2C: 118.0 ml LV vol d, MOD A4C: 94.6 ml LV vol s, MOD A2C: 41.8 ml LV vol s, MOD A4C: 29.3 ml LV SV MOD A2C:     76.2 ml LV SV MOD A4C:     94.6 ml LV SV MOD BP:      72.0 ml RIGHT VENTRICLE             IVC RV Basal diam:  4.50 cm     IVC diam: 1.20 cm RV Mid diam:    3.20 cm RV S prime:     12.30 cm/s TAPSE (M-mode): 2.7 cm LEFT ATRIUM             Index        RIGHT ATRIUM           Index LA diam:        3.80 cm 2.09 cm/m   RA Area:     26.70 cm LA Vol (A2C):   84.6 ml 46.60 ml/m  RA Volume:   93.00 ml  51.23 ml/m LA Vol (A4C):   42.9 ml 23.63 ml/m LA Biplane Vol: 64.3 ml 35.42 ml/m  AORTIC VALVE AV Area (Vmax): 4.07 cm AV Vmax:        115.00 cm/s AV Peak Grad:   5.3 mmHg LVOT Vmax:      123.00 cm/s  AORTA Ao Root diam: 2.50 cm Ao Asc diam:  3.70 cm MITRAL VALVE MV Area (PHT): 3.48 cm     SHUNTS MV Decel Time: 218 msec     Systemic Diam: 2.20 cm MV E velocity: 77.50 cm/s MV A velocity: 103.00 cm/s MV E/A ratio:  0.75 Maude Emmer MD Electronically signed by Maude Emmer MD Signature Date/Time:  05/09/2024/4:22:04 PM    Final    CT Chest Wo Contrast Result Date: 05/08/2024 EXAM: CT CHEST WITHOUT CONTRAST 05/08/2024 09:55:00 PM TECHNIQUE: CT of the chest was performed without the administration of intravenous contrast. Multiplanar reformatted images are provided for review. Automated exposure control, iterative reconstruction, and/or weight based adjustment of the mA/kV was utilized to reduce the radiation dose to as low as reasonably achievable. COMPARISON: Chest radiograph earlier today and CTA chest dated 08/03/2021. CLINICAL HISTORY: Cough, chronic/persisting > 8 weeks, failed empiric treatment. FINDINGS: MEDIASTINUM: Cardiomegaly. Severe 3-vessel coronary atherosclerosis. Enlargement of the main pulmonary artery, suggesting pulmonary arterial hypertension. Mild thoracic aortic atherosclerosis. The pericardium is unremarkable. The central airways are clear. LYMPH NODES: No mediastinal, hilar or axillary lymphadenopathy. LUNGS AND PLEURA: Scattered patchy opacities in the lungs bilaterally, right lower lobe predominant, suggesting multifocal atelectasis. Superimposed right lower lobe pneumonia is possible but not favored. Bilateral lower lobe bronchiectasis. No frank interstitial edema. No pleural effusion or pneumothorax. SOFT TISSUES/BONES: No acute abnormality of the bones or soft tissues. UPPER ABDOMEN: Limited images of the upper abdomen demonstrates no acute abnormality. IMPRESSION: 1. Scattered patchy pulmonary opacities, right lower lobe predominant,  favoring multifocal atelectasis; superimposed right lower lobe pneumonia is possible but not favored. 2. Cardiomegaly. No interstitial edema. 3. Pulmonary arterial hypertension. Electronically signed by: Pinkie Pebbles MD MD 05/08/2024 10:02 PM EST RP Workstation: [id number]   DG Chest Port 1 View Result Date: 05/08/2024 CLINICAL DATA:  Sepsis, short of breath EXAM: PORTABLE CHEST 1 VIEW COMPARISON:  02/22/2022 FINDINGS: Single frontal view of  the chest demonstrates an enlarged cardiac silhouette. There is pulmonary vascular congestion with bilateral perihilar airspace disease and small right pleural effusion, consistent with congestive heart failure. No pneumothorax. No acute bony abnormalities. IMPRESSION: 1. Congestive heart failure. Electronically Signed   By: Ozell Daring [name]   On: 05/08/2024 21:13    Assessment/Plan: CHF, pneumonia on antibiotics likely with secondary yeast vulvovaginitis. Swab for vaginitis is ordered and I ordered Diflucan  for likely yeast infection. This may take a few days to resolve and if swab shows another diagnosis please call Gyn attending [phone]  Lynwood Solomons 05/10/2024       [1]  Allergies Allergen Reactions   Doxycycline  Other (See Comments)    Unsteady gait   Aspirin  Nausea And Vomiting   Azithromycin  Other (See Comments)    Unknown reaction per family member messed her up   Caffeine Nausea And Vomiting   Ciprofloxacin Nausea And Vomiting    Tolerates levaquin    Codeine Nausea And Vomiting   Oxycodone  Other (See Comments)    Pt stated, upsets my stomach (11/19/17)   Sulfamethizole Other (See Comments)    Unknown reaction   Tramadol Hcl Other (See Comments)    REACTION: pt states spaced-out

## 2024-05-10 NOTE — Plan of Care (Signed)
   Problem: Activity: Goal: Risk for activity intolerance will decrease Outcome: Progressing   Problem: Nutrition: Goal: Adequate nutrition will be maintained Outcome: Progressing   Problem: Elimination: Goal: Will not experience complications related to urinary retention Outcome: Progressing   Problem: Pain Managment: Goal: General experience of comfort will improve and/or be controlled Outcome: Progressing

## 2024-05-11 ENCOUNTER — Other Ambulatory Visit (HOSPITAL_COMMUNITY): Payer: Self-pay

## 2024-05-11 DIAGNOSIS — J159 Unspecified bacterial pneumonia: Secondary | ICD-10-CM | POA: Diagnosis not present

## 2024-05-11 LAB — CBC WITH DIFFERENTIAL/PLATELET
Abs Immature Granulocytes: 0.04 K/uL (ref 0.00–0.07)
Basophils Absolute: 0 K/uL (ref 0.0–0.1)
Basophils Relative: 0 %
Eosinophils Absolute: 0.2 K/uL (ref 0.0–0.5)
Eosinophils Relative: 2 %
HCT: 29.6 % — ABNORMAL LOW (ref 36.0–46.0)
Hemoglobin: 9.4 g/dL — ABNORMAL LOW (ref 12.0–15.0)
Immature Granulocytes: 0 %
Lymphocytes Relative: 11 %
Lymphs Abs: 1 K/uL (ref 0.7–4.0)
MCH: 29.2 pg (ref 26.0–34.0)
MCHC: 31.8 g/dL (ref 30.0–36.0)
MCV: 91.9 fL (ref 80.0–100.0)
Monocytes Absolute: 0.8 K/uL (ref 0.1–1.0)
Monocytes Relative: 9 %
Neutro Abs: 7.1 K/uL (ref 1.7–7.7)
Neutrophils Relative %: 78 %
Platelets: 376 K/uL (ref 150–400)
RBC: 3.22 MIL/uL — ABNORMAL LOW (ref 3.87–5.11)
RDW: 16.1 % — ABNORMAL HIGH (ref 11.5–15.5)
WBC: 9.1 K/uL (ref 4.0–10.5)
nRBC: 0 % (ref 0.0–0.2)

## 2024-05-11 LAB — CERVICOVAGINAL ANCILLARY ONLY
Chlamydia: NEGATIVE
Comment: NEGATIVE
Comment: NEGATIVE
Comment: NORMAL
Neisseria Gonorrhea: NEGATIVE
Trichomonas: NEGATIVE

## 2024-05-11 LAB — BASIC METABOLIC PANEL WITH GFR
Anion gap: 11 (ref 5–15)
BUN: 17 mg/dL (ref 8–23)
CO2: 24 mmol/L (ref 22–32)
Calcium: 7.9 mg/dL — ABNORMAL LOW (ref 8.9–10.3)
Chloride: 100 mmol/L (ref 98–111)
Creatinine, Ser: 0.72 mg/dL (ref 0.44–1.00)
GFR, Estimated: >60 mL/min
Glucose, Bld: 219 mg/dL — ABNORMAL HIGH (ref 70–99)
Potassium: 4.1 mmol/L (ref 3.5–5.1)
Sodium: 135 mmol/L (ref 135–145)

## 2024-05-11 LAB — GLUCOSE, CAPILLARY
Glucose-Capillary: 170 mg/dL — ABNORMAL HIGH (ref 70–99)
Glucose-Capillary: 145 mg/dL — ABNORMAL HIGH (ref 70–99)
Glucose-Capillary: 203 mg/dL — ABNORMAL HIGH (ref 70–99)
Glucose-Capillary: 266 mg/dL — ABNORMAL HIGH (ref 70–99)

## 2024-05-11 LAB — MAGNESIUM: Magnesium: 1.7 mg/dL (ref 1.7–2.4)

## 2024-05-11 LAB — PROCALCITONIN: Procalcitonin: <0.1 ng/mL

## 2024-05-11 LAB — C-REACTIVE PROTEIN: CRP: 5.5 mg/dL — ABNORMAL HIGH

## 2024-05-11 MED ORDER — FUROSEMIDE 10 MG/ML IJ SOLN
60.0000 mg | Freq: Two times a day (BID) | INTRAMUSCULAR | Status: AC
  Administered 2024-05-11 (×2): 60 mg via INTRAVENOUS
  Filled 2024-05-11 (×2): qty 6

## 2024-05-11 MED ORDER — DOXYCYCLINE HYCLATE 100 MG PO TABS
100.0000 mg | ORAL_TABLET | Freq: Two times a day (BID) | ORAL | Status: DC
  Administered 2024-05-11 – 2024-05-17 (×14): 100 mg via ORAL
  Filled 2024-05-11 (×15): qty 1

## 2024-05-11 MED ORDER — METOLAZONE 2.5 MG PO TABS
2.5000 mg | ORAL_TABLET | Freq: Once | ORAL | Status: AC
  Administered 2024-05-11: 2.5 mg via ORAL
  Filled 2024-05-11 (×2): qty 1

## 2024-05-11 MED ORDER — MAGNESIUM SULFATE 2 GM/50ML IV SOLN
2.0000 g | Freq: Once | INTRAVENOUS | Status: AC
  Administered 2024-05-11: 2 g via INTRAVENOUS
  Filled 2024-05-11: qty 50

## 2024-05-11 NOTE — Evaluation (Signed)
 Occupational Therapy Evaluation Patient Details Name: [name] MRN: [id number] DOB: 05/21/1940 Today's Date: 05/11/2024   History of Present Illness   [name] is a 84 y.o. female who presents emergency department with shortness of breath.  Patient is a poor historian however on arrival was found to be satting 65%.  Was placed on supplemental oxygen  with improvement.  She was endorsing a profound cough and found to be febrile.  She was recently seen by primary care doctor and given Levaquin  for presumed pneumonia; with medical history significant of CHF, hypertension.     Clinical Impressions Patient more alert and participatory than with PT previous day.  Oriented to person, place and situation.  Following commands well.  Able to sit at EOB with supervision, stand with Min A and take pivotal steps to recliner with RW.  Patient refuses to discuss SNF level rehab, wants to return home and is open to [facility] if needed.  OT will continue efforts in the acute setting to address deficits.       If plan is discharge home, recommend the following:   A lot of help with bathing/dressing/bathroom;A lot of help with walking and/or transfers;Assist for transportation     Functional Status Assessment   Patient has had a recent decline in their functional status and demonstrates the ability to make significant improvements in function in a reasonable and predictable amount of time.     Equipment Recommendations   None recommended by OT     Recommendations for Other Services         Precautions/Restrictions   Precautions Precautions: Fall Recall of Precautions/Restrictions: Intact Precaution/Restrictions Comments: watch O2 sats Restrictions Weight Bearing Restrictions Per Provider Order: No     Mobility Bed Mobility Overal bed mobility: Needs Assistance Bed Mobility: Supine to Sit     Supine to sit: Supervision, HOB elevated, Used rails          Transfers Overall  transfer level: Needs assistance Equipment used: Rolling walker (2 wheels) Transfers: Sit to/from Stand, Bed to chair/wheelchair/BSC Sit to Stand: Min assist Stand pivot transfers: Contact guard assist                Balance Overall balance assessment: Needs assistance Sitting-balance support: Feet supported Sitting balance-Leahy Scale: Fair     Standing balance support: Reliant on assistive device for balance Standing balance-Leahy Scale: Poor                             ADL either performed or assessed with clinical judgement   ADL       Grooming: Wash/dry hands;Wash/dry face;Supervision/safety;Sitting           Upper Body Dressing : Minimal assistance;Sitting   Lower Body Dressing: Moderate assistance;Sit to/from stand   Toilet Transfer: Contact guard assist;Minimal assistance;Rolling walker (2 wheels);Stand-pivot;BSC/3in1                   Vision Baseline Vision/History: 1 Wears glasses Patient Visual Report: No change from baseline       Perception Perception: Not tested       Praxis Praxis: Not tested       Pertinent Vitals/Pain Pain Assessment Pain Assessment: Faces Faces Pain Scale: Hurts a little bit Pain Location: Low back Pain Descriptors / Indicators: Sore Pain Intervention(s): Monitored during session     Extremity/Trunk Assessment Upper Extremity Assessment Upper Extremity Assessment: Right hand dominant;LUE deficits/detail LUE Deficits / Details: Decreased shoulder flexion,  can touch the top of her head. LUE Sensation: WNL LUE Coordination: WNL   Lower Extremity Assessment Lower Extremity Assessment: Defer to PT evaluation   Cervical / Trunk Assessment Cervical / Trunk Assessment: Kyphotic   Communication Communication Communication: No apparent difficulties   Cognition Arousal: Alert Behavior During Therapy: WFL for tasks assessed/performed Cognition: No family/caregiver present to determine baseline              OT - Cognition Comments: Short term memory deficits.  Mild confusion.                 Following commands: Intact Following commands impaired: Only follows one step commands consistently     Cueing  General Comments   Cueing Techniques: Verbal cues;Gestural cues   VSS on O2   Exercises     Shoulder Instructions      Home Living Family/patient expects to be discharged to:: Private residence Living Arrangements: Children Available Help at Discharge: Family;Available PRN/intermittently Type of Home: House Home Access: Ramped entrance     Home Layout: One level     Bathroom Shower/Tub: Chief Strategy Officer: Standard Bathroom Accessibility: Yes How Accessible: Accessible via walker Home Equipment: Educational Psychologist (4 wheels)   Additional Comments: Son works 3 days outside the home.  DIL is home, but needs assist from husband.  DIL cannot physically assist.      Prior Functioning/Environment Prior Level of Function : Independent/Modified Independent             Mobility Comments: Uses rollator ADLs Comments: Reports independence with BADLs    OT Problem List: Decreased strength;Decreased activity tolerance;Impaired balance (sitting and/or standing);Decreased safety awareness   OT Treatment/Interventions: Self-care/ADL training;Balance training;Therapeutic activities;DME and/or AE instruction;Energy conservation;Cognitive remediation/compensation;Patient/family education      OT Goals(Current goals can be found in the care plan section)   Acute Rehab OT Goals Patient Stated Goal: Return home OT Goal Formulation: With patient Time For Goal Achievement: 05/25/24 Potential to Achieve Goals: Fair ADL Goals Pt Will Perform Grooming: with modified independence;standing Pt Will Perform Upper Body Dressing: with set-up;sitting Pt Will Perform Lower Body Dressing: sit to/from stand;with set-up Pt Will Transfer to Toilet: with  modified independence;ambulating;regular height toilet   OT Frequency:  Min 2X/week    Co-evaluation              AM-PAC OT 6 Clicks Daily Activity     Outcome Measure Help from another person eating meals?: A Little Help from another person taking care of personal grooming?: A Little Help from another person toileting, which includes using toliet, bedpan, or urinal?: A Lot Help from another person bathing (including washing, rinsing, drying)?: A Lot Help from another person to put on and taking off regular upper body clothing?: A Lot Help from another person to put on and taking off regular lower body clothing?: A Lot 6 Click Score: 14   End of Session Equipment Utilized During Treatment: Rolling walker (2 wheels);Oxygen  Nurse Communication: Mobility status  Activity Tolerance: Patient tolerated treatment well Patient left: in chair;with call bell/phone within reach;with chair alarm set  OT Visit Diagnosis: Unsteadiness on feet (R26.81);Muscle weakness (generalized) (M62.81)                Time: 8693-8671 OT Time Calculation (min): 22 min Charges:  OT General Charges $OT Visit: 1 Visit OT Evaluation $OT Eval Moderate Complexity: 1 Mod  05/11/2024  RP, OTR/L  Acute Rehabilitation Services  Office:  [phone]   Charlie  D Ramar Nobrega 05/11/2024, 2:12 PM

## 2024-05-11 NOTE — TOC Initial Note (Signed)
 Transition of Care [facility]) - Initial/Assessment Note    Patient Details  Name: [name] MRN: [id number] Date of Birth: November 18, 1940  Transition of Care [facility]) CM/SW Contact:    Inocente GORMAN Kindle, LCSW Phone Number: 05/11/2024, 11:47 AM  Clinical Narrative:                 CSW received consult for possible SNF at time of discharge. CSW spoke with patient and daughter at bedside. Patient reported that she does not want to go to SNF and would like home health services instead. Daughter reported agreement with plan and confirms patient lives with patient's son.   Patient reports no preference for [facility] agency but has used [facility] a few years ago per chart review. CSW discussed equipment needs and patient has home O2, CPAP, and a rollator in the room. She declined BSC. CSW confirmed PCP and address.    Expected Discharge Plan: Home w Home Health Services Barriers to Discharge: Continued Medical Work up   Patient Goals and CMS Choice Patient states their goals for this hospitalization and ongoing recovery are:: Return home CMS Medicare.gov Compare Post Acute Care list provided to:: Patient Choice offered to / list presented to : Patient, Adult Children [place] ownership interest in [facility].provided to:: Patient    Expected Discharge Plan and Services In-house Referral: Clinical Social Work Discharge Planning Services: CM Consult Post Acute Care Choice: Home Health Living arrangements for the past 2 months: Single Family Home                                      Prior Living Arrangements/Services Living arrangements for the past 2 months: Single Family Home Lives with:: Adult Children Patient language and need for interpreter reviewed:: Yes Do you feel safe going back to the place where you live?: Yes      Need for Family Participation in Patient Care: Yes (Comment) Care giver support system in place?: Yes (comment) Current home services: DME (O2, cpap,  rollator) Criminal Activity/Legal Involvement Pertinent to Current Situation/Hospitalization: No - Comment as needed  Activities of Daily Living      Permission Sought/Granted Permission sought to share information with : Facility Medical Sales Representative, Family Supports Permission granted to share information with : Yes, Verbal Permission Granted  Share Information with NAME: Marry Conger -Daughter   [phone]  or [place], [place]  [phone] /[phone]  Permission granted to share info w AGENCY: [facility]        Emotional Assessment Appearance:: Appears stated age Attitude/Demeanor/Rapport: Engaged Affect (typically observed): Accepting Orientation: : Oriented to Self, Oriented to Place, Oriented to Situation Alcohol  / Substance Use: Not Applicable Psych Involvement: No (comment)  Admission diagnosis:  Hypoxia [R09.02] Community acquired bacterial pneumonia [J15.9] Septic shock (HCC) [A41.9, R65.21] Pneumonia due to infectious organism, unspecified laterality, unspecified part of lung [J18.9] Patient Active Problem List   Diagnosis Date Noted   Community acquired bacterial pneumonia 05/08/2024   Acute otalgia, right 05/14/2023   Chronic respiratory failure with hypoxia (HCC) 12/26/2021   Hypomagnesemia 08/21/2021   Acute respiratory failure with hypoxia (HCC) 08/06/2021   Community acquired pneumonia of right lung 08/06/2021   Cor pulmonale, acute (HCC) 08/06/2021   Acute HFrEF (heart failure with reduced ejection fraction) (HCC) 08/06/2021   OSA (obstructive sleep apnea) 08/06/2021   Cervical radicular pain 08/06/2021   Cardiogenic shock (HCC) 08/03/2021   Leg cramps 07/18/2020   Low  back pain 10/21/2018   Fibromyalgia 01/03/2017   Ulcerative colitis (HCC) 11/25/2011   Coronary artery disease involving native coronary artery of native heart with angina pectoris    Bronchiectasis [facility])    Essential hypertension    Vitamin D  deficiency 12/03/2009   Depression  08/10/2008   PULMONARY NODULE 07/15/2008   NEOPLASM, KIDNEY 03/16/2008   STRESS INCONTINENCE 03/16/2008   COLONIC POLYPS 07/19/2007   Diabetes mellitus with coincident hypertension (HCC) 07/19/2007   Morbid obesity (HCC) 07/19/2007   Asthma 07/19/2007   DIVERTICULOSIS OF COLON 07/19/2007   ANXIETY 03/19/2007   GERD 03/19/2007   Osteoarthritis 03/19/2007   FIBROMYALGIA 03/19/2007   PCP:  [name], MD Pharmacy:   [id number] [place], [place] - [address] [place] [address] Phone: [phone] Fax: [phone]  [id number] [place], [place] - [address] AT [facility] OF [facility] & [address] [place] [address] Phone: [phone] Fax: [phone]  [facility] [address] [place] - [address] AT [address] [place] [address] Phone: [phone] Fax: [phone]  [id number] [place], [place] - [address] [place] [address] Phone: [phone] Fax: [phone]     Social Drivers of Health (SDOH) Social History: SDOH Screenings   Food Insecurity: No Food Insecurity (05/09/2024)  Housing: Low Risk (05/09/2024)  Transportation Needs: No Transportation Needs (05/09/2024)  Utilities: Not At Risk (05/09/2024)  Alcohol  Screen: Low Risk (05/31/2023)  Depression (PHQ2-9): Low Risk (05/31/2023)  Financial Resource Strain: Low Risk (05/31/2023)  Physical Activity: Inactive (05/31/2023)  Social Connections: Moderately Integrated (05/09/2024)  Stress: No Stress Concern Present (05/31/2023)  Tobacco Use: Medium Risk (05/08/2024)  Health Literacy: Adequate Health Literacy (05/31/2023)   SDOH Interventions:     Readmission Risk Interventions     No data to display

## 2024-05-11 NOTE — Progress Notes (Signed)
 "                                                                                                                                                                                                                                                                                PROGRESS NOTE     Patient Demographics:    [name], is a 84 y.o. female, DOB - [birth year], FMW:[id number]  Outpatient Primary MD for the patient is [name], MD    LOS - 3  Admit date - 05/08/2024    Chief Complaint  Patient presents with   Shortness of Breath       Brief Narrative (HPI from H&P)    Kyria Bumgardner is a 84 y.o. female with medical history significant of CHF, hypertension who presents emergency department with shortness of breath.  Patient is a poor historian however on arrival was found to be satting 65%.  Was placed on supplemental oxygen  with improvement.  She was endorsing a profound cough and found to be febrile.  She was recently seen by primary care doctor and given Levaquin  for presumed pneumonia.  On arrival labs were obtained which showed respiratory viral panel negative, potassium 5.2, glucose 300, WBC 12.2, hemoglobin 10.8, INR 1.1, BNP 1100, lactic acid 2.0.  Patient underwent CT chest which demonstrated pulmonary opacities.  Chest x-ray showed volume overload.  Patient was started on IV antibiotics admitted for further workup.  On evaluation patient had 3+ pitting edema and JVD.    Subjective:   Patient in bed, appears comfortable, denies any headache, no fever, no chest pain or pressure, no shortness of breath , no abdominal pain. No focal weakness.   Assessment  & Plan :   Acute hypoxic respiratory failure due to combination of CAP and acute on chronic diastolic CHF EF 60% -   Failed outpatient Levaquin  treatment, antibiotics adjusted, main component appears to be of CHF, placed on IV Lasix  dose increased further on 05/11/2024, echo shows a EF of 60%, clinically improving with  diuresis, taper down antibiotics and continue diuresis with Lasix  IV.  Advance activity and monitor.  Encouraged to sit in chair use I-S and flutter valve for  pulmonary toiletry. For now continue antibiotics, Lasix , beta-blocker, ARB, Imdur  combination.  Follow cultures.   SpO2: 93 % O2 Flow Rate (L/min): 10 L/min  # CAD history of bare metal stent placement in the proximal LAD, with subsequent cardiac catheterizations showing a patent stent, continue Plavix , beta-blocker and statin combination  # Generalized anxiety-continue Xanax    # Hypertension-continue amlodipine , Imdur , losartan , metoprolol    # Pancreatic insufficiency-continue Creon    # Chronic pain-continue gabapentin    # Ulcerative colitis-continue Lialda , budesonide , colestid    Patient Lines/Drains/Airways Status     Active Line/Drains/Airways     Name Placement date Placement time Site Days   Peripheral IV 05/08/24 18 G Left Antecubital 05/08/24  2000  Antecubital  3   Urethral Catheter Alexis, RN Latex 14 Fr. 05/10/24  1830  Latex  1              Nutrition Problem:        Obesity: Estimated body mass index is 36.58 kg/m as calculated from the following:   Height as of 02/27/24: 5' (1.524 m).   Weight as of 02/27/24: 85 kg.          Condition - Guarded  Family Communication  : Called son Elsie [phone] - 05/10/2024 at 8:19 AM  Consults.  Cardiology   code Status : Full code      Procedures.    CT scan chest.  1. Scattered patchy pulmonary opacities, right lower lobe predominant, favoring multifocal atelectasis; superimposed right lower lobe pneumonia is possible but not favored. 2. Cardiomegaly. No interstitial edema. 3. Pulmonary arterial hypertension  Echocardiogram.  1. EF significantly improved since TTE done 04/26/22 . Left ventricular ejection fraction, by estimation, is 60 to 65%. The left ventricle has normal function. The left ventricle has no regional wall motion abnormalities.  Left ventricular diastolic parameters are consistent with Grade I diastolic dysfunction (impaired relaxation). The average left ventricular global longitudinal strain is -21.6 %. The global longitudinal strain is normal.  2. Right ventricular systolic function is normal. The right ventricular size is normal.  3. Left atrial size was mildly dilated.  4. Right atrial size was mildly dilated.  5. The mitral valve is normal in structure. No evidence of mitral valve regurgitation. No evidence of mitral stenosis.  6. The aortic valve is tricuspid. There is mild calcification of the aortic valve. Aortic valve regurgitation is trivial. Aortic valve sclerosis is present, with no evidence of aortic valve stenosis.  7. The inferior vena cava is normal in size with greater than 50% respiratory variability, suggesting right atrial pressure of 3 mmHg   Disposition Plan  :    Status is: Inpatient   DVT Prophylaxis  :    enoxaparin  (LOVENOX ) injection 40 mg Start: 05/09/24 1000 SCDs Start: 05/08/24 2300   Lab Results  Component Value Date   PLT 376 05/11/2024    Diet :  Diet Order             Diet Carb Modified Room service appropriate? Yes  Diet effective now                    Inpatient Medications  Scheduled Meds:  amLODipine   10 mg Oral Daily   atorvastatin   20 mg Oral Daily   budesonide   9 mg Oral Daily   Chlorhexidine  Gluconate Cloth  6 each Topical Daily   clopidogrel   75 mg Oral Daily   colchicine   0.6 mg Oral BID   colestipol   2 g  Oral BID   doxycycline   100 mg Oral Q12H   enoxaparin  (LOVENOX ) injection  40 mg Subcutaneous Daily   furosemide   60 mg Intravenous BID   gabapentin   300 mg Oral BID   insulin  aspart  0-5 Units Subcutaneous QHS   insulin  aspart  0-6 Units Subcutaneous TID WC   isosorbide  mononitrate  30 mg Oral Daily   lipase/protease/amylase  24,000 Units Oral TID with meals   losartan   25 mg Oral Daily   mesalamine   2.4 g Oral BID   metoprolol  succinate  100  mg Oral Daily   morphine   30 mg Oral Q12H   nystatin    Topical BID   pantoprazole   40 mg Oral BID   tamsulosin   0.4 mg Oral QPC supper   Continuous Infusions:   PRN Meds:.acetaminophen  **OR** acetaminophen , ALPRAZolam , HYDROcodone  bit-homatropine, ipratropium-albuterol , ondansetron  **OR** ondansetron  (ZOFRAN ) IV  Antibiotics  :    Anti-infectives (From admission, onward)    Start     Dose/Rate Route Frequency Ordered Stop   05/11/24 1130  doxycycline  (VIBRA -TABS) tablet 100 mg        100 mg Oral Every 12 hours 05/11/24 1034     05/10/24 2330  vancomycin  (VANCOREADY) IVPB 1500 mg/300 mL  Status:  Discontinued       Placed in Followed by Linked Group   1,500 mg 150 mL/hr over 120 Minutes Intravenous Every 48 hours 05/08/24 2316 05/11/24 1034   05/10/24 1200  fluconazole  (DIFLUCAN ) tablet 150 mg        150 mg Oral  Once 05/10/24 1109 05/10/24 1259   05/10/24 1145  fluconazole  (DIFLUCAN ) tablet 100 mg  Status:  Discontinued        100 mg Oral Daily 05/10/24 1047 05/10/24 1215   05/09/24 0600  ceFEPIme  (MAXIPIME ) 2 g in sodium chloride  0.9 % 100 mL IVPB  Status:  Discontinued        2 g 200 mL/hr over 30 Minutes Intravenous Every 8 hours 05/08/24 2316 05/11/24 1034   05/08/24 2330  vancomycin  (VANCOREADY) IVPB 1750 mg/350 mL       Placed in Followed by Linked Group   1,750 mg 175 mL/hr over 120 Minutes Intravenous  Once 05/08/24 2316 05/09/24 0255   05/08/24 2130  doxycycline  (VIBRAMYCIN ) 100 mg in sodium chloride  0.9 % 250 mL IVPB  Status:  Discontinued        100 mg 125 mL/hr over 120 Minutes Intravenous  Once 05/08/24 2119 05/08/24 2137   05/08/24 2000  cefTRIAXone  (ROCEPHIN ) 2 g in sodium chloride  0.9 % 100 mL IVPB        2 g 200 mL/hr over 30 Minutes Intravenous Once 05/08/24 1956 05/08/24 2131   05/08/24 2000  azithromycin  (ZITHROMAX ) 500 mg in sodium chloride  0.9 % 250 mL IVPB  Status:  Discontinued        500 mg 250 mL/hr over 60 Minutes Intravenous  Once 05/08/24 1956  05/08/24 2119         Objective:   Vitals:   05/10/24 2000 05/11/24 0000 05/11/24 0410 05/11/24 0755  BP: (!) 107/56 111/63 116/64 122/60  Pulse: 80 71 70 76  Resp: 20 15 16 18   Temp: 98 F (36.7 C) 98.1 F (36.7 C) 98 F (36.7 C) 98.2 F (36.8 C)  TempSrc: Oral Oral Oral Oral  SpO2: 92% 94% 94% 93%    Wt Readings from Last 3 Encounters:  02/27/24 85 kg  12/17/23 95.5 kg  10/10/23 88.9 kg  Intake/Output Summary (Last 24 hours) at 05/11/2024 1035 Last data filed at 05/11/2024 0524 Gross per 24 hour  Intake --  Output 1400 ml  Net -1400 ml     Physical Exam  Awake Alert, No new F.N deficits, Normal affect [place].AT,PERRAL Symmetrical Chest wall movement, Good air movement bilaterally, bibasilar crackles RRR,No Gallops,Rubs or new Murmurs,  +ve B.Sounds, Abd Soft, No tenderness,   No Cyanosis, Clubbing or edema      Data Review:    Recent Labs  Lab 05/08/24 2016 05/09/24 0337 05/11/24 0346  WBC 12.2* 13.0* 9.1  HGB 10.8* 10.2* 9.4*  HCT 33.9* 32.3* 29.6*  PLT 382 350 376  MCV 90.6 91.5 91.9  MCH 28.9 28.9 29.2  MCHC 31.9 31.6 31.8  RDW 17.2* 17.2* 16.1*  LYMPHSABS 1.3  --  1.0  MONOABS 1.0  --  0.8  EOSABS 0.1  --  0.2  BASOSABS 0.0  --  0.0    Recent Labs  Lab 05/08/24 2016 05/08/24 2107 05/09/24 0025 05/09/24 0337 05/10/24 0934 05/11/24 0346  NA 135  --   --  138 135 135  K 5.2*  --   --  4.1 3.7 4.1  CL 101  --   --  104 99 100  CO2 23  --   --  23 24 24   ANIONGAP 11  --   --  11 13 11   GLUCOSE 256*  --   --  244* 221* 219*  BUN 8  --   --  7* 14 17  CREATININE 0.89  --   --  0.74 0.75 0.72  AST 22  --   --  18  --   --   ALT 7  --   --  5  --   --   ALKPHOS 78  --   --  72  --   --   BILITOT 0.5  --   --  0.4  --   --   ALBUMIN 3.5  --   --  3.2*  --   --   CRP  --   --   --   --  8.7* 5.5*  PROCALCITON  --   --   --   --  0.10 <0.10  LATICACIDVEN  --  2.0* 1.1  --   --   --   INR 1.1  --   --   --   --   --   MG  --   --    --   --   --  1.7  CALCIUM  8.6*  --   --  8.1* 7.9* 7.9*      Recent Labs  Lab 05/08/24 2016 05/08/24 2107 05/09/24 0025 05/09/24 0337 05/10/24 0934 05/11/24 0346  CRP  --   --   --   --  8.7* 5.5*  PROCALCITON  --   --   --   --  0.10 <0.10  LATICACIDVEN  --  2.0* 1.1  --   --   --   INR 1.1  --   --   --   --   --   MG  --   --   --   --   --  1.7  CALCIUM  8.6*  --   --  8.1* 7.9* 7.9*    --------------------------------------------------------------------------------------------------------------- Lab Results  Component Value Date   CHOL 146 06/16/2021   HDL 58.60 06/16/2021   LDLCALC 66 06/16/2021   TRIG 104.0  06/16/2021   CHOLHDL 2 06/16/2021    Lab Results  Component Value Date   HGBA1C 6.8 (A) 10/11/2022   No results for input(s): TSH, T4TOTAL, FREET4, T3FREE, THYROIDAB in the last 72 hours. No results for input(s): VITAMINB12, FOLATE, FERRITIN, TIBC, IRON , RETICCTPCT in the last 72 hours. ------------------------------------------------------------------------------------------------------------------ Cardiac Enzymes No results for input(s): CKMB, TROPONINI, MYOGLOBIN in the last 168 hours.  Invalid input(s): CK  Micro Results Recent Results (from the past 240 hours)  Resp panel by RT-PCR (RSV, Flu A&B, Covid) Anterior Nasal Swab     Status: None   Collection Time: 05/08/24  7:56 PM   Specimen: Anterior Nasal Swab  Result Value Ref Range Status   SARS Coronavirus 2 by RT PCR NEGATIVE NEGATIVE Final   Influenza A by PCR NEGATIVE NEGATIVE Final   Influenza B by PCR NEGATIVE NEGATIVE Final    Comment: (NOTE) The Xpert Xpress SARS-CoV-2/FLU/RSV plus assay is intended as an aid in the diagnosis of influenza from Nasopharyngeal swab specimens and should not be used as a sole basis for treatment. Nasal washings and aspirates are unacceptable for Xpert Xpress SARS-CoV-2/FLU/RSV testing.  Fact Sheet for  Patients: bloggercourse.com  Fact Sheet for Healthcare Providers: seriousbroker.it  This test is not yet approved or cleared by the [place]  FDA and has been authorized for detection and/or diagnosis of SARS-CoV-2 by FDA under an Emergency Use Authorization (EUA). This EUA will remain in effect (meaning this test can be used) for the duration of the COVID-19 declaration under Section 564(b)(1) of the Act, 21 U.S.C. section 360bbb-3(b)(1), unless the authorization is terminated or revoked.     Resp Syncytial Virus by PCR NEGATIVE NEGATIVE Final    Comment: (NOTE) Fact Sheet for Patients: bloggercourse.com  Fact Sheet for Healthcare Providers: seriousbroker.it  This test is not yet approved or cleared by the [place]  FDA and has been authorized for detection and/or diagnosis of SARS-CoV-2 by FDA under an Emergency Use Authorization (EUA). This EUA will remain in effect (meaning this test can be used) for the duration of the COVID-19 declaration under Section 564(b)(1) of the Act, 21 U.S.C. section 360bbb-3(b)(1), unless the authorization is terminated or revoked.  Performed at [facility], [address], [place], [place] [address]   Blood Culture (routine x 2)     Status: None (Preliminary result)   Collection Time: 05/08/24  8:16 PM   Specimen: BLOOD RIGHT ARM  Result Value Ref Range Status   Specimen Description BLOOD RIGHT ARM  Final   Special Requests   Final    BOTTLES DRAWN AEROBIC AND ANAEROBIC Blood Culture results may not be optimal due to an inadequate volume of blood received in culture bottles   Culture   Final    NO GROWTH 3 DAYS Performed at [facility], [address], [place], [place] [address]    Report Status PENDING  Incomplete  Blood Culture (routine x 2)     Status: None (Preliminary result)   Collection Time: 05/08/24  8:46 PM    Specimen: BLOOD RIGHT ARM  Result Value Ref Range Status   Specimen Description BLOOD RIGHT ARM  Final   Special Requests   Final    BOTTLES DRAWN AEROBIC AND ANAEROBIC Blood Culture results may not be optimal due to an inadequate volume of blood received in culture bottles   Culture   Final    NO GROWTH 3 DAYS Performed at [facility], [address], [place],  [place] [address]    Report Status PENDING  Incomplete  Urine Culture     Status: Abnormal   Collection Time: 05/08/24 10:58 PM   Specimen: Urine, Random  Result Value Ref Range Status   Specimen Description URINE, RANDOM  Final   Special Requests   Final    NONE Reflexed from [id number] Performed at [facility], [address], [place], [place] [address]    Culture (A)  Final    20,000 COLONIES/mL MULTIPLE SPECIES PRESENT, SUGGEST RECOLLECTION   Report Status 05/10/2024 FINAL  Final  MRSA Next Gen by PCR, Nasal     Status: None   Collection Time: 05/10/24  5:24 AM   Specimen: Nasal Mucosa; Nasal Swab  Result Value Ref Range Status   MRSA by PCR Next Gen NOT DETECTED NOT DETECTED Final    Comment: (NOTE) The GeneXpert MRSA Assay (FDA approved for NASAL specimens only), is one component of a comprehensive MRSA colonization surveillance program. It is not intended to diagnose MRSA infection nor to guide or monitor treatment for MRSA infections. Test performance is not FDA approved in patients less than 42 years old. Performed at [facility], [address], [place], [place] [address]     Radiology Report DG Chest Port 1 View Result Date: 05/10/2024 EXAM: 1 VIEW XRAY OF THE CHEST 05/10/2024 06:32:31 AM COMPARISON: 05/08/2024 CLINICAL HISTORY: 84 year old female with shortness of breath. FINDINGS: LUNGS AND PLEURA: No significant effusions. Diffuse interstitial opacities with patchy airspace opacities. Band-like opacity in right mid lung. No pneumothorax. HEART AND MEDIASTINUM: Cardiomegaly. Aortic  atherosclerotic calcifications. BONES AND SOFT TISSUES: No acute osseous abnormality. IMPRESSION: 1. Unresolved pulmonary edema with new atelectasis or trace pleural fluid along the right minor fissure. Electronically signed by: Helayne Hurst MD MD 05/10/2024 06:46 AM EST RP Workstation: [id number]   ECHOCARDIOGRAM COMPLETE Result Date: 05/09/2024    ECHOCARDIOGRAM REPORT   Patient Name:   Abigial Degner Date of Exam: 05/09/2024 Medical Rec #:  [id number]     Height:       60.0 in Accession #:    [id number]    Weight:       187.3 lb Date of Birth:  1941-04-12     BSA:          1.815 m Patient Age:    83 years      BP:           144/77 mmHg Patient Gender: F             HR:           85 bpm. Exam Location:  Inpatient Procedure: 2D Echo, Cardiac Doppler, Color Doppler and Strain Analysis (Both            Spectral and Color Flow Doppler were utilized during procedure). Indications:    CHF-Acute Systolic  History:        Patient has prior history of Echocardiogram examinations, most                 recent 04/26/2022. CHF, CAD, Signs/Symptoms:Syncope; Risk                 Factors:Hypertension, Diabetes, Dyslipidemia and Former Smoker.  Sonographer:    Logan Shove RDCS Referring Phys: [id number] [facility]  Sonographer Comments: Suboptimal parasternal window. IMPRESSIONS  1. EF significantly improved since TTE done 04/26/22 . Left ventricular ejection fraction, by estimation, is 60 to 65%. The left ventricle has normal function. The left ventricle has  no regional wall motion abnormalities. Left ventricular diastolic parameters are consistent with Grade I diastolic dysfunction (impaired relaxation). The average left ventricular global longitudinal strain is -21.6 %. The global longitudinal strain is normal.  2. Right ventricular systolic function is normal. The right ventricular size is normal.  3. Left atrial size was mildly dilated.  4. Right atrial size was mildly dilated.  5. The mitral valve is normal in structure. No  evidence of mitral valve regurgitation. No evidence of mitral stenosis.  6. The aortic valve is tricuspid. There is mild calcification of the aortic valve. Aortic valve regurgitation is trivial. Aortic valve sclerosis is present, with no evidence of aortic valve stenosis.  7. The inferior vena cava is normal in size with greater than 50% respiratory variability, suggesting right atrial pressure of 3 mmHg. FINDINGS  Left Ventricle: EF significantly improved since TTE done 04/26/22. Left ventricular ejection fraction, by estimation, is 60 to 65%. The left ventricle has normal function. The left ventricle has no regional wall motion abnormalities. The average left ventricular global longitudinal strain is -21.6 %. Strain was performed and the global longitudinal strain is normal. The left ventricular internal cavity size was normal in size. There is no left ventricular hypertrophy. Left ventricular diastolic parameters are consistent with Grade I diastolic dysfunction (impaired relaxation). Right Ventricle: The right ventricular size is normal. No increase in right ventricular wall thickness. Right ventricular systolic function is normal. Left Atrium: Left atrial size was mildly dilated. Right Atrium: Right atrial size was mildly dilated. Pericardium: There is no evidence of pericardial effusion. Mitral Valve: The mitral valve is normal in structure. No evidence of mitral valve regurgitation. No evidence of mitral valve stenosis. Tricuspid Valve: The tricuspid valve is normal in structure. Tricuspid valve regurgitation is mild . No evidence of tricuspid stenosis. Aortic Valve: The aortic valve is tricuspid. There is mild calcification of the aortic valve. Aortic valve regurgitation is trivial. Aortic valve sclerosis is present, with no evidence of aortic valve stenosis. Aortic valve peak gradient measures 5.3 mmHg. Pulmonic Valve: The pulmonic valve was normal in structure. Pulmonic valve regurgitation is mild. No  evidence of pulmonic stenosis. Aorta: The aortic root is normal in size and structure. Venous: The inferior vena cava is normal in size with greater than 50% respiratory variability, suggesting right atrial pressure of 3 mmHg. IAS/Shunts: No atrial level shunt detected by color flow Doppler. Additional Comments: 3D was performed not requiring image post processing on an independent workstation and was normal.  LEFT VENTRICLE PLAX 2D LVIDd:         5.00 cm      Diastology LVIDs:         2.30 cm      LV e' medial:    5.66 cm/s LV PW:         0.90 cm      LV E/e' medial:  13.7 LV IVS:        1.00 cm      LV e' lateral:   7.72 cm/s LVOT diam:     2.20 cm      LV E/e' lateral: 10.0 LVOT Area:     3.80 cm LV IVRT:       106 msec     2D Longitudinal Strain                             2D Strain GLS Avg:     -21.6 %  LV Volumes (MOD) LV vol d, MOD A2C: 118.0 ml LV vol d, MOD A4C: 94.6 ml LV vol s, MOD A2C: 41.8 ml LV vol s, MOD A4C: 29.3 ml LV SV MOD A2C:     76.2 ml LV SV MOD A4C:     94.6 ml LV SV MOD BP:      72.0 ml RIGHT VENTRICLE             IVC RV Basal diam:  4.50 cm     IVC diam: 1.20 cm RV Mid diam:    3.20 cm RV S prime:     12.30 cm/s TAPSE (M-mode): 2.7 cm LEFT ATRIUM             Index        RIGHT ATRIUM           Index LA diam:        3.80 cm 2.09 cm/m   RA Area:     26.70 cm LA Vol (A2C):   84.6 ml 46.60 ml/m  RA Volume:   93.00 ml  51.23 ml/m LA Vol (A4C):   42.9 ml 23.63 ml/m LA Biplane Vol: 64.3 ml 35.42 ml/m  AORTIC VALVE AV Area (Vmax): 4.07 cm AV Vmax:        115.00 cm/s AV Peak Grad:   5.3 mmHg LVOT Vmax:      123.00 cm/s  AORTA Ao Root diam: 2.50 cm Ao Asc diam:  3.70 cm MITRAL VALVE MV Area (PHT): 3.48 cm     SHUNTS MV Decel Time: 218 msec     Systemic Diam: 2.20 cm MV E velocity: 77.50 cm/s MV A velocity: 103.00 cm/s MV E/A ratio:  0.75 Maude Emmer MD Electronically signed by Maude Emmer MD Signature Date/Time: 05/09/2024/4:22:04 PM    Final      Signature  -   Lavada Stank DO on  05/11/2024 at 10:35 AM   -  To page go to [email or web address]   "

## 2024-05-12 DIAGNOSIS — I272 Pulmonary hypertension, unspecified: Secondary | ICD-10-CM | POA: Diagnosis not present

## 2024-05-12 DIAGNOSIS — I2510 Atherosclerotic heart disease of native coronary artery without angina pectoris: Secondary | ICD-10-CM | POA: Diagnosis not present

## 2024-05-12 DIAGNOSIS — J159 Unspecified bacterial pneumonia: Secondary | ICD-10-CM | POA: Diagnosis not present

## 2024-05-12 LAB — GLUCOSE, CAPILLARY
Glucose-Capillary: 178 mg/dL — ABNORMAL HIGH (ref 70–99)
Glucose-Capillary: 187 mg/dL — ABNORMAL HIGH (ref 70–99)
Glucose-Capillary: 285 mg/dL — ABNORMAL HIGH (ref 70–99)
Glucose-Capillary: 248 mg/dL — ABNORMAL HIGH (ref 70–99)

## 2024-05-12 LAB — STREP PNEUMONIAE URINARY ANTIGEN: Strep Pneumo Urinary Antigen: NEGATIVE

## 2024-05-12 LAB — BASIC METABOLIC PANEL WITH GFR
Anion gap: 10 (ref 5–15)
BUN: 15 mg/dL (ref 8–23)
CO2: 27 mmol/L (ref 22–32)
Calcium: 8.1 mg/dL — ABNORMAL LOW (ref 8.9–10.3)
Chloride: 99 mmol/L (ref 98–111)
Creatinine, Ser: 0.66 mg/dL (ref 0.44–1.00)
GFR, Estimated: >60 mL/min
Glucose, Bld: 238 mg/dL — ABNORMAL HIGH (ref 70–99)
Potassium: 3.3 mmol/L — ABNORMAL LOW (ref 3.5–5.1)
Sodium: 135 mmol/L (ref 135–145)

## 2024-05-12 LAB — CBC WITH DIFFERENTIAL/PLATELET
Abs Immature Granulocytes: 0.05 K/uL (ref 0.00–0.07)
Basophils Absolute: 0 K/uL (ref 0.0–0.1)
Basophils Relative: 0 %
Eosinophils Absolute: 0.2 K/uL (ref 0.0–0.5)
Eosinophils Relative: 2 %
HCT: 29.4 % — ABNORMAL LOW (ref 36.0–46.0)
Hemoglobin: 9.5 g/dL — ABNORMAL LOW (ref 12.0–15.0)
Immature Granulocytes: 1 %
Lymphocytes Relative: 13 %
Lymphs Abs: 1.1 K/uL (ref 0.7–4.0)
MCH: 29.1 pg (ref 26.0–34.0)
MCHC: 32.3 g/dL (ref 30.0–36.0)
MCV: 90.2 fL (ref 80.0–100.0)
Monocytes Absolute: 0.8 K/uL (ref 0.1–1.0)
Monocytes Relative: 9 %
Neutro Abs: 6.5 K/uL (ref 1.7–7.7)
Neutrophils Relative %: 75 %
Platelets: 480 K/uL — ABNORMAL HIGH (ref 150–400)
RBC: 3.26 MIL/uL — ABNORMAL LOW (ref 3.87–5.11)
RDW: 15.8 % — ABNORMAL HIGH (ref 11.5–15.5)
WBC: 8.8 K/uL (ref 4.0–10.5)
nRBC: 0 % (ref 0.0–0.2)

## 2024-05-12 LAB — PROCALCITONIN: Procalcitonin: <0.1 ng/mL

## 2024-05-12 LAB — MAGNESIUM: Magnesium: 1.7 mg/dL (ref 1.7–2.4)

## 2024-05-12 LAB — C-REACTIVE PROTEIN: CRP: 2.9 mg/dL — ABNORMAL HIGH

## 2024-05-12 MED ORDER — POTASSIUM CHLORIDE CRYS ER 20 MEQ PO TBCR
40.0000 meq | EXTENDED_RELEASE_TABLET | Freq: Two times a day (BID) | ORAL | Status: AC
  Administered 2024-05-12 (×2): 40 meq via ORAL
  Filled 2024-05-12 (×2): qty 2

## 2024-05-12 MED ORDER — SPIRONOLACTONE 25 MG PO TABS
25.0000 mg | ORAL_TABLET | Freq: Once | ORAL | Status: AC
  Administered 2024-05-12: 25 mg via ORAL
  Filled 2024-05-12: qty 1

## 2024-05-12 MED ORDER — FUROSEMIDE 10 MG/ML IJ SOLN
60.0000 mg | Freq: Two times a day (BID) | INTRAMUSCULAR | Status: AC
  Administered 2024-05-12 (×2): 60 mg via INTRAVENOUS
  Filled 2024-05-12 (×2): qty 6

## 2024-05-12 MED ORDER — MAGNESIUM SULFATE IN D5W 1-5 GM/100ML-% IV SOLN
1.0000 g | Freq: Once | INTRAVENOUS | Status: AC
  Administered 2024-05-12: 1 g via INTRAVENOUS
  Filled 2024-05-12: qty 100

## 2024-05-12 NOTE — Progress Notes (Addendum)
 "                                                                                                                                                                                                                                                                                PROGRESS NOTE     Patient Demographics:    [name], is a 84 y.o. female, DOB - 08/16/1940, FMW:[id number]  Outpatient Primary MD for the patient is [name], MD    LOS - 4  Admit date - 05/08/2024    Chief Complaint  Patient presents with   Shortness of Breath       Brief Narrative (HPI from H&P)    [name] is a 84 y.o. female with medical history significant of CHF, hypertension who presents emergency department with shortness of breath.  Patient is a poor historian however on arrival was found to be satting 65%.  Was placed on supplemental oxygen  with improvement.  She was endorsing a profound cough and found to be febrile.  She was recently seen by primary care doctor and given Levaquin  for presumed pneumonia.  On arrival labs were obtained which showed respiratory viral panel negative, potassium 5.2, glucose 300, WBC 12.2, hemoglobin 10.8, INR 1.1, BNP 1100, lactic acid 2.0.  Patient underwent CT chest which demonstrated pulmonary opacities.  Chest x-ray showed volume overload.  Patient was started on IV antibiotics admitted for further workup.  On evaluation patient had 3+ pitting edema and JVD.    Subjective:   Patient in bed, appears comfortable, denies any headache, no fever, no chest pain or pressure, much improved shortness of breath , no abdominal pain. No new focal weakness.    Assessment  & Plan :   Acute hypoxic respiratory failure due to combination of CAP and acute on chronic diastolic CHF EF 60% -   Failed outpatient Levaquin  treatment, antibiotics adjusted, main component appears to be of CHF, placed on IV Lasix  dose increased further on 05/11/2024, echo shows a EF of 60%, clinically  improving with diuresis, taper down antibiotics and continue diuresis with Lasix  IV.  Advance activity and monitor.  Encouraged to sit in chair use I-S and  flutter valve for pulmonary toiletry. For now continue antibiotics, Lasix , beta-blocker, ARB, Imdur  combination.  Follow cultures.   SpO2: 93 % O2 Flow Rate (L/min): 2 L/min  # CAD history of bare metal stent placement in the proximal LAD, with subsequent cardiac catheterizations showing a patent stent, continue Plavix , beta-blocker and statin combination  # Generalized anxiety-continue Xanax    # Hypertension-continue amlodipine , Imdur , losartan , metoprolol    # Pancreatic insufficiency-continue Creon    # Chronic pain-continue gabapentin    # Ulcerative colitis-continue Lialda , budesonide , colestid   # Obesity.  BMI over 30.  Follow-up with PCP.  # Vaginal discharge.  Seen by [facility] physician, stable vaginal swab, empiric Diflucan  given.  Much improved.      Condition - Guarded  Family Communication  : Called son Elsie [phone] - 05/10/2024 at 8:19 AM, called again on 05/12/2024 at 10:27 AM.  Mailbox full, updated again at 11.10 am.  Consults.  Cardiology, OB  Code Status : Full code      Procedures.    CT scan chest.  1. Scattered patchy pulmonary opacities, right lower lobe predominant, favoring multifocal atelectasis; superimposed right lower lobe pneumonia is possible but not favored. 2. Cardiomegaly. No interstitial edema. 3. Pulmonary arterial hypertension  Echocardiogram.  1. EF significantly improved since TTE done 04/26/22 . Left ventricular ejection fraction, by estimation, is 60 to 65%. The left ventricle has normal function. The left ventricle has no regional wall motion abnormalities. Left ventricular diastolic parameters are consistent with Grade I diastolic dysfunction (impaired relaxation). The average left ventricular global longitudinal strain is -21.6 %. The global longitudinal strain is normal.  2. Right  ventricular systolic function is normal. The right ventricular size is normal.  3. Left atrial size was mildly dilated.  4. Right atrial size was mildly dilated.  5. The mitral valve is normal in structure. No evidence of mitral valve regurgitation. No evidence of mitral stenosis.  6. The aortic valve is tricuspid. There is mild calcification of the aortic valve. Aortic valve regurgitation is trivial. Aortic valve sclerosis is present, with no evidence of aortic valve stenosis.  7. The inferior vena cava is normal in size with greater than 50% respiratory variability, suggesting right atrial pressure of 3 mmHg   Disposition Plan  :    Status is: Inpatient   DVT Prophylaxis  :    enoxaparin  (LOVENOX ) injection 40 mg Start: 05/09/24 1000 SCDs Start: 05/08/24 2300   Lab Results  Component Value Date   PLT 480 (H) 05/12/2024    Diet :  Diet Order             Diet Carb Modified Room service appropriate? Yes  Diet effective now                    Inpatient Medications  Scheduled Meds:  amLODipine   10 mg Oral Daily   atorvastatin   20 mg Oral Daily   budesonide   9 mg Oral Daily   Chlorhexidine  Gluconate Cloth  6 each Topical Daily   clopidogrel   75 mg Oral Daily   colchicine   0.6 mg Oral BID   colestipol   2 g Oral BID   doxycycline   100 mg Oral Q12H   enoxaparin  (LOVENOX ) injection  40 mg Subcutaneous Daily   furosemide   60 mg Intravenous BID   gabapentin   300 mg Oral BID   insulin  aspart  0-5 Units Subcutaneous QHS   insulin  aspart  0-6 Units Subcutaneous TID WC   isosorbide  mononitrate  30 mg  Oral Daily   lipase/protease/amylase  24,000 Units Oral TID with meals   losartan   25 mg Oral Daily   mesalamine   2.4 g Oral BID   metoprolol  succinate  100 mg Oral Daily   morphine   30 mg Oral Q12H   nystatin    Topical BID   pantoprazole   40 mg Oral BID   potassium chloride   40 mEq Oral BID   tamsulosin   0.4 mg Oral QPC supper   Continuous Infusions:   PRN  Meds:.acetaminophen  **OR** acetaminophen , ALPRAZolam , HYDROcodone  bit-homatropine, ipratropium-albuterol , ondansetron  **OR** ondansetron  (ZOFRAN ) IV  Antibiotics  :    Anti-infectives (From admission, onward)    Start     Dose/Rate Route Frequency Ordered Stop   05/11/24 1130  doxycycline  (VIBRA -TABS) tablet 100 mg        100 mg Oral Every 12 hours 05/11/24 1034     05/10/24 2330  vancomycin  (VANCOREADY) IVPB 1500 mg/300 mL  Status:  Discontinued       Placed in Followed by Linked Group   1,500 mg 150 mL/hr over 120 Minutes Intravenous Every 48 hours 05/08/24 2316 05/11/24 1034   05/10/24 1200  fluconazole  (DIFLUCAN ) tablet 150 mg        150 mg Oral  Once 05/10/24 1109 05/10/24 1259   05/10/24 1145  fluconazole  (DIFLUCAN ) tablet 100 mg  Status:  Discontinued        100 mg Oral Daily 05/10/24 1047 05/10/24 1215   05/09/24 0600  ceFEPIme  (MAXIPIME ) 2 g in sodium chloride  0.9 % 100 mL IVPB  Status:  Discontinued        2 g 200 mL/hr over 30 Minutes Intravenous Every 8 hours 05/08/24 2316 05/11/24 1034   05/08/24 2330  vancomycin  (VANCOREADY) IVPB 1750 mg/350 mL       Placed in Followed by Linked Group   1,750 mg 175 mL/hr over 120 Minutes Intravenous  Once 05/08/24 2316 05/09/24 0255   05/08/24 2130  doxycycline  (VIBRAMYCIN ) 100 mg in sodium chloride  0.9 % 250 mL IVPB  Status:  Discontinued        100 mg 125 mL/hr over 120 Minutes Intravenous  Once 05/08/24 2119 05/08/24 2137   05/08/24 2000  cefTRIAXone  (ROCEPHIN ) 2 g in sodium chloride  0.9 % 100 mL IVPB        2 g 200 mL/hr over 30 Minutes Intravenous Once 05/08/24 1956 05/08/24 2131   05/08/24 2000  azithromycin  (ZITHROMAX ) 500 mg in sodium chloride  0.9 % 250 mL IVPB  Status:  Discontinued        500 mg 250 mL/hr over 60 Minutes Intravenous  Once 05/08/24 1956 05/08/24 2119         Objective:   Vitals:   05/11/24 2100 05/11/24 2315 05/12/24 0400 05/12/24 0740  BP:  114/74 127/68 131/73  Pulse: 78 75 74 78  Resp: 20 20  (!) 21 (!) 33  Temp:  98.5 F (36.9 C) 98.2 F (36.8 C) 98.7 F (37.1 C)  TempSrc:  Oral Oral Oral  SpO2: 93% 94% 92% 93%    Wt Readings from Last 3 Encounters:  02/27/24 85 kg  12/17/23 95.5 kg  10/10/23 88.9 kg     Intake/Output Summary (Last 24 hours) at 05/12/2024 1027 Last data filed at 05/12/2024 0519 Gross per 24 hour  Intake 240 ml  Output 2400 ml  Net -2160 ml     Physical Exam  Awake Alert, No new F.N deficits, Normal affect [place].AT,PERRAL Symmetrical Chest wall movement, Good air movement bilaterally,  bibasilar crackles RRR,No Gallops,Rubs or new Murmurs,  +ve B.Sounds, Abd Soft, No tenderness,   No Cyanosis, Clubbing or edema      Data Review:    Recent Labs  Lab 05/08/24 2016 05/09/24 0337 05/11/24 0346 05/12/24 0339  WBC 12.2* 13.0* 9.1 8.8  HGB 10.8* 10.2* 9.4* 9.5*  HCT 33.9* 32.3* 29.6* 29.4*  PLT 382 350 376 480*  MCV 90.6 91.5 91.9 90.2  MCH 28.9 28.9 29.2 29.1  MCHC 31.9 31.6 31.8 32.3  RDW 17.2* 17.2* 16.1* 15.8*  LYMPHSABS 1.3  --  1.0 1.1  MONOABS 1.0  --  0.8 0.8  EOSABS 0.1  --  0.2 0.2  BASOSABS 0.0  --  0.0 0.0    Recent Labs  Lab 05/08/24 2016 05/08/24 2107 05/09/24 0025 05/09/24 0337 05/10/24 0934 05/11/24 0346 05/12/24 0339  NA 135  --   --  138 135 135 135  K 5.2*  --   --  4.1 3.7 4.1 3.3*  CL 101  --   --  104 99 100 99  CO2 23  --   --  23 24 24 27   ANIONGAP 11  --   --  11 13 11 10   GLUCOSE 256*  --   --  244* 221* 219* 238*  BUN 8  --   --  7* 14 17 15   CREATININE 0.89  --   --  0.74 0.75 0.72 0.66  AST 22  --   --  18  --   --   --   ALT 7  --   --  5  --   --   --   ALKPHOS 78  --   --  72  --   --   --   BILITOT 0.5  --   --  0.4  --   --   --   ALBUMIN 3.5  --   --  3.2*  --   --   --   CRP  --   --   --   --  8.7* 5.5* 2.9*  PROCALCITON  --   --   --   --  0.10 <0.10 <0.10  LATICACIDVEN  --  2.0* 1.1  --   --   --   --   INR 1.1  --   --   --   --   --   --   MG  --   --   --   --   --  1.7 1.7   CALCIUM  8.6*  --   --  8.1* 7.9* 7.9* 8.1*      Recent Labs  Lab 05/08/24 2016 05/08/24 2107 05/09/24 0025 05/09/24 0337 05/10/24 0934 05/11/24 0346 05/12/24 0339  CRP  --   --   --   --  8.7* 5.5* 2.9*  PROCALCITON  --   --   --   --  0.10 <0.10 <0.10  LATICACIDVEN  --  2.0* 1.1  --   --   --   --   INR 1.1  --   --   --   --   --   --   MG  --   --   --   --   --  1.7 1.7  CALCIUM  8.6*  --   --  8.1* 7.9* 7.9* 8.1*    --------------------------------------------------------------------------------------------------------------- Lab Results  Component Value Date   CHOL 146 06/16/2021   HDL 58.60 06/16/2021  LDLCALC 66 06/16/2021   TRIG 104.0 06/16/2021   CHOLHDL 2 06/16/2021    Lab Results  Component Value Date   HGBA1C 6.8 (A) 10/11/2022   No results for input(s): TSH, T4TOTAL, FREET4, T3FREE, THYROIDAB in the last 72 hours. No results for input(s): VITAMINB12, FOLATE, FERRITIN, TIBC, IRON , RETICCTPCT in the last 72 hours. ------------------------------------------------------------------------------------------------------------------ Cardiac Enzymes No results for input(s): CKMB, TROPONINI, MYOGLOBIN in the last 168 hours.  Invalid input(s): CK  Micro Results Recent Results (from the past 240 hours)  Resp panel by RT-PCR (RSV, Flu A&B, Covid) Anterior Nasal Swab     Status: None   Collection Time: 05/08/24  7:56 PM   Specimen: Anterior Nasal Swab  Result Value Ref Range Status   SARS Coronavirus 2 by RT PCR NEGATIVE NEGATIVE Final   Influenza A by PCR NEGATIVE NEGATIVE Final   Influenza B by PCR NEGATIVE NEGATIVE Final    Comment: (NOTE) The Xpert Xpress SARS-CoV-2/FLU/RSV plus assay is intended as an aid in the diagnosis of influenza from Nasopharyngeal swab specimens and should not be used as a sole basis for treatment. Nasal washings and aspirates are unacceptable for Xpert Xpress SARS-CoV-2/FLU/RSV testing.  Fact  Sheet for Patients: bloggercourse.com  Fact Sheet for Healthcare Providers: seriousbroker.it  This test is not yet approved or cleared by the [place]  FDA and has been authorized for detection and/or diagnosis of SARS-CoV-2 by FDA under an Emergency Use Authorization (EUA). This EUA will remain in effect (meaning this test can be used) for the duration of the COVID-19 declaration under Section 564(b)(1) of the Act, 21 U.S.C. section 360bbb-3(b)(1), unless the authorization is terminated or revoked.     Resp Syncytial Virus by PCR NEGATIVE NEGATIVE Final    Comment: (NOTE) Fact Sheet for Patients: bloggercourse.com  Fact Sheet for Healthcare Providers: seriousbroker.it  This test is not yet approved or cleared by the [place]  FDA and has been authorized for detection and/or diagnosis of SARS-CoV-2 by FDA under an Emergency Use Authorization (EUA). This EUA will remain in effect (meaning this test can be used) for the duration of the COVID-19 declaration under Section 564(b)(1) of the Act, 21 U.S.C. section 360bbb-3(b)(1), unless the authorization is terminated or revoked.  Performed at [facility], [address] [place], [place] [address]   Blood Culture (routine x 2)     Status: None (Preliminary result)   Collection Time: 05/08/24  8:16 PM   Specimen: BLOOD RIGHT ARM  Result Value Ref Range Status   Specimen Description BLOOD RIGHT ARM  Final   Special Requests   Final    BOTTLES DRAWN AEROBIC AND ANAEROBIC Blood Culture results may not be optimal due to an inadequate volume of blood received in culture bottles   Culture   Final    NO GROWTH 3 DAYS Performed at [facility], [address], [place], [place] [address]    Report Status PENDING  Incomplete  Blood Culture (routine x 2)     Status: None (Preliminary result)   Collection Time: 05/08/24   8:46 PM   Specimen: BLOOD RIGHT ARM  Result Value Ref Range Status   Specimen Description BLOOD RIGHT ARM  Final   Special Requests   Final    BOTTLES DRAWN AEROBIC AND ANAEROBIC Blood Culture results may not be optimal due to an inadequate volume of blood received in culture bottles   Culture   Final    NO GROWTH 3 DAYS Performed at [facility], [address] [place], [place] [address]    Report Status PENDING  Incomplete  Urine Culture     Status: Abnormal   Collection Time: 05/08/24 10:58 PM   Specimen: Urine, Random  Result Value Ref Range Status   Specimen Description URINE, RANDOM  Final   Special Requests   Final    NONE Reflexed from [id number] Performed at [facility], [address] [place], [place] [address]    Culture (A)  Final    20,000 COLONIES/mL MULTIPLE SPECIES PRESENT, SUGGEST RECOLLECTION   Report Status 05/10/2024 FINAL  Final  MRSA Next Gen by PCR, Nasal     Status: None   Collection Time: 05/10/24  5:24 AM   Specimen: Nasal Mucosa; Nasal Swab  Result Value Ref Range Status   MRSA by PCR Next Gen NOT DETECTED NOT DETECTED Final    Comment: (NOTE) The GeneXpert MRSA Assay (FDA approved for NASAL specimens only), is one component of a comprehensive MRSA colonization surveillance program. It is not intended to diagnose MRSA infection nor to guide or monitor treatment for MRSA infections. Test performance is not FDA approved in patients less than 38 years old. Performed at [facility], [address] [place], [place] [address]     Radiology Report No results found.    Signature  -   Lavada Stank DO on 05/12/2024 at 10:27 AM   -  To page go to [email or web address]   "

## 2024-05-12 NOTE — Progress Notes (Signed)
 Physical Therapy Treatment Patient Details Name: [name] MRN: [id number] DOB: 03/05/1941 Today's Date: 05/12/2024   History of Present Illness [name] is a 84 y.o. female who presents emergency department with shortness of breath.  Patient is a poor historian however on arrival was found to be satting 65%.  Was placed on supplemental oxygen  with improvement.  She was endorsing a profound cough and found to be febrile.  She was recently seen by primary care doctor and given Levaquin  for presumed pneumonia; with medical history significant of CHF, hypertension.    PT Comments  Pt received in supine and agreeable to session. Pt reports dizziness once sitting EOB that persists during session, however pt adamant about walking. Pt able to stand with min A and begin walking around the bed, however backs quickly back to EOB for seated rest break. Pt able to complete distance to recliner during second attempt. Pt requests use of rollator instead of RW despite education on increased support with RW. Pt requires 8L O2 to maintain SpO2 mostly >90%, but intermittently drops to 86%. Pt continues to benefit from PT services to progress toward functional mobility goals.    If plan is discharge home, recommend the following: A lot of help with walking and/or transfers;A lot of help with bathing/dressing/bathroom;Help with stairs or ramp for entrance;Assist for transportation   Can travel by private vehicle     No  Equipment Recommendations  Rolling walker (2 wheels);BSC/3in1;Wheelchair (measurements PT);Wheelchair cushion (measurements PT)    Recommendations for Other Services       Precautions / Restrictions Precautions Precautions: Fall Recall of Precautions/Restrictions: Intact Precaution/Restrictions Comments: watch O2 sats Restrictions Weight Bearing Restrictions Per Provider Order: No     Mobility  Bed Mobility Overal bed mobility: Needs Assistance Bed Mobility: Supine to Sit      Supine to sit: Supervision, HOB elevated, Used rails     General bed mobility comments: increased time and use of bed features    Transfers Overall transfer level: Needs assistance Equipment used: Rollator (4 wheels) Transfers: Sit to/from Stand Sit to Stand: Min assist           General transfer comment: STS from low EOB with min A for power up and cues for hand placement    Ambulation/Gait Ambulation/Gait assistance: Min assist Gait Distance (Feet): 10 Feet Assistive device: Rollator (4 wheels) Gait Pattern/deviations: Step-through pattern, Decreased stride length, Trunk flexed, Wide base of support       General Gait Details: Pt demonstrates short, low steps with cues for safety and assist for balance and line management. During first trial pt backed quickly back to EOB for seated break due to dizziness   Stairs             Wheelchair Mobility     Tilt Bed    Modified Rankin (Stroke Patients Only)       Balance Overall balance assessment: Needs assistance Sitting-balance support: Feet supported Sitting balance-Leahy Scale: Fair Sitting balance - Comments: EOB   Standing balance support: Reliant on assistive device for balance, Bilateral upper extremity supported, During functional activity Standing balance-Leahy Scale: Poor Standing balance comment: reliant on rollator and external support dynamically                            Communication Communication Communication: No apparent difficulties  Cognition Arousal: Alert Behavior During Therapy: WFL for tasks assessed/performed   PT - Cognitive impairments: Awareness, Attention, Initiation, Sequencing,  Problem solving, Safety/Judgement                       PT - Cognition Comments: increased time for processing and responses requiring increased cues. At one point pt had both hands on rollator brakes for support to stand despite cues to place hands on handles and pt states i  don't understand with education Following commands: Intact Following commands impaired: Only follows one step commands consistently, Follows one step commands with increased time    Cueing Cueing Techniques: Verbal cues, Gestural cues  Exercises      General Comments General comments (skin integrity, edema, etc.): SpO2 95% on 7L at rest and >90% on 8L during mobility with intermittent drops to 86% requiring rest break and cues for pursed lip breathing      Pertinent Vitals/Pain Pain Assessment Pain Assessment: Faces Faces Pain Scale: Hurts a little bit Pain Location: generalized Pain Descriptors / Indicators: Discomfort Pain Intervention(s): Monitored during session     PT Goals (current goals can now be found in the care plan section) Acute Rehab PT Goals Patient Stated Goal: Did not state PT Goal Formulation: Patient unable to participate in goal setting Time For Goal Achievement: 05/24/24 Progress towards PT goals: Progressing toward goals    Frequency    Min 2X/week       AM-PAC PT 6 Clicks Mobility   Outcome Measure  Help needed turning from your back to your side while in a flat bed without using bedrails?: A Little Help needed moving from lying on your back to sitting on the side of a flat bed without using bedrails?: A Little Help needed moving to and from a bed to a chair (including a wheelchair)?: A Little Help needed standing up from a chair using your arms (e.g., wheelchair or bedside chair)?: A Little Help needed to walk in hospital room?: A Lot Help needed climbing 3-5 steps with a railing? : Total 6 Click Score: 15    End of Session Equipment Utilized During Treatment: Gait belt;Oxygen  Activity Tolerance: Patient limited by fatigue;Patient tolerated treatment well Patient left: in chair;with chair alarm set;with call bell/phone within reach Nurse Communication: Mobility status PT Visit Diagnosis: Muscle weakness (generalized) (M62.81);Adult,  failure to thrive (R62.7);Difficulty in walking, not elsewhere classified (R26.2)     Time: 8848-8783 PT Time Calculation (min) (ACUTE ONLY): 25 min  Charges:    $Gait Training: 8-22 mins $Therapeutic Activity: 8-22 mins PT General Charges $$ ACUTE PT VISIT: 1 Visit                    Darryle George, PTA Acute Rehabilitation Services Secure Chat Preferred  [phone]    Darryle George 05/12/2024, 1:25 PM

## 2024-05-12 NOTE — Care Management Important Message (Signed)
 Important Message  Patient Details  Name: [name] MRN: [id number] Date of Birth: 1940/05/25   Important Message Given:  Yes - Medicare IM     Claretta Deed 05/12/2024, 2:49 PM

## 2024-05-12 NOTE — Plan of Care (Signed)

## 2024-05-12 NOTE — Inpatient Diabetes Management (Signed)
 Inpatient Diabetes Program Recommendations  AACE/ADA: New Consensus Statement on Inpatient Glycemic Control   Target Ranges:  Prepandial:   less than 140 mg/dL      Peak postprandial:   less than 180 mg/dL (1-2 hours)      Critically ill patients:  140 - 180 mg/dL   Lab Results  Component Value Date   GLUCAP 178 (H) 05/12/2024   HGBA1C 6.8 (A) 10/11/2022    Latest Reference Range & Units 05/11/24 07:54 05/11/24 11:58 05/11/24 16:36 05/11/24 21:01 05/12/24 07:40  Glucose-Capillary 70 - 99 mg/dL 829 (H) 854 (H) 796 (H) 266 (H) 178 (H)   Review of Glycemic Control  Diabetes history: DM2  Outpatient Diabetes medications:  Januvia  100mg  daily  Farxiga  10mg  daily  Amaryla 2mg  daily   Current orders for Inpatient glycemic control:  Novolog  0-6 units TID + 0-5 units at bedtime   Noted patient receiving Entocort 9mg  daily   Inpatient Diabetes Program Recommendations:   Please consider:  - Increase Novolog  correction to 0-9 units TID - Order updated hemoglobin A1C   Thanks,  Lavanda Search, RN, MSN, [facility]  Inpatient Diabetes Coordinator  Pager [phone] (8a-5p)

## 2024-05-13 ENCOUNTER — Inpatient Hospital Stay (HOSPITAL_COMMUNITY)

## 2024-05-13 DIAGNOSIS — J159 Unspecified bacterial pneumonia: Secondary | ICD-10-CM | POA: Diagnosis not present

## 2024-05-13 DIAGNOSIS — I272 Pulmonary hypertension, unspecified: Secondary | ICD-10-CM

## 2024-05-13 LAB — CBC WITH DIFFERENTIAL/PLATELET
Abs Immature Granulocytes: 0.05 K/uL (ref 0.00–0.07)
Basophils Absolute: 0 K/uL (ref 0.0–0.1)
Basophils Relative: 0 %
Eosinophils Absolute: 0.2 K/uL (ref 0.0–0.5)
Eosinophils Relative: 3 %
HCT: 29.8 % — ABNORMAL LOW (ref 36.0–46.0)
Hemoglobin: 9.7 g/dL — ABNORMAL LOW (ref 12.0–15.0)
Immature Granulocytes: 1 %
Lymphocytes Relative: 18 %
Lymphs Abs: 1.7 K/uL (ref 0.7–4.0)
MCH: 29.3 pg (ref 26.0–34.0)
MCHC: 32.6 g/dL (ref 30.0–36.0)
MCV: 90 fL (ref 80.0–100.0)
Monocytes Absolute: 0.9 K/uL (ref 0.1–1.0)
Monocytes Relative: 10 %
Neutro Abs: 6.4 K/uL (ref 1.7–7.7)
Neutrophils Relative %: 68 %
Platelets: 535 K/uL — ABNORMAL HIGH (ref 150–400)
RBC: 3.31 MIL/uL — ABNORMAL LOW (ref 3.87–5.11)
RDW: 15.7 % — ABNORMAL HIGH (ref 11.5–15.5)
WBC: 9.3 K/uL (ref 4.0–10.5)
nRBC: 0 % (ref 0.0–0.2)

## 2024-05-13 LAB — CULTURE, BLOOD (ROUTINE X 2)
Culture: NO GROWTH
Culture: NO GROWTH

## 2024-05-13 LAB — BASIC METABOLIC PANEL WITH GFR
Anion gap: 8 (ref 5–15)
BUN: 17 mg/dL (ref 8–23)
CO2: 28 mmol/L (ref 22–32)
Calcium: 8.6 mg/dL — ABNORMAL LOW (ref 8.9–10.3)
Chloride: 100 mmol/L (ref 98–111)
Creatinine, Ser: 0.69 mg/dL (ref 0.44–1.00)
GFR, Estimated: >60 mL/min
Glucose, Bld: 144 mg/dL — ABNORMAL HIGH (ref 70–99)
Potassium: 4.1 mmol/L (ref 3.5–5.1)
Sodium: 136 mmol/L (ref 135–145)

## 2024-05-13 LAB — PRO BRAIN NATRIURETIC PEPTIDE: Pro Brain Natriuretic Peptide: 258 pg/mL

## 2024-05-13 LAB — GLUCOSE, CAPILLARY
Glucose-Capillary: 169 mg/dL — ABNORMAL HIGH (ref 70–99)
Glucose-Capillary: 194 mg/dL — ABNORMAL HIGH (ref 70–99)
Glucose-Capillary: 251 mg/dL — ABNORMAL HIGH (ref 70–99)
Glucose-Capillary: 278 mg/dL — ABNORMAL HIGH (ref 70–99)
Glucose-Capillary: 243 mg/dL — ABNORMAL HIGH (ref 70–99)

## 2024-05-13 LAB — BLOOD GAS, ARTERIAL
Acid-Base Excess: 5.5 mmol/L — ABNORMAL HIGH (ref 0.0–2.0)
Bicarbonate: 31.1 mmol/L — ABNORMAL HIGH (ref 20.0–28.0)
O2 Saturation: 95.4 %
Patient temperature: 37
pCO2 arterial: 48 mmHg (ref 32–48)
pH, Arterial: 7.42 (ref 7.35–7.45)
pO2, Arterial: 67 mmHg — ABNORMAL LOW (ref 83–108)

## 2024-05-13 LAB — MAGNESIUM: Magnesium: 1.9 mg/dL (ref 1.7–2.4)

## 2024-05-13 LAB — C-REACTIVE PROTEIN: CRP: 1.4 mg/dL — ABNORMAL HIGH

## 2024-05-13 LAB — PROCALCITONIN: Procalcitonin: <0.1 ng/mL

## 2024-05-13 MED ORDER — FUROSEMIDE 10 MG/ML IJ SOLN
80.0000 mg | Freq: Two times a day (BID) | INTRAMUSCULAR | Status: AC
  Administered 2024-05-13 (×2): 80 mg via INTRAVENOUS
  Filled 2024-05-13 (×2): qty 8

## 2024-05-13 MED ORDER — SALINE SPRAY 0.65 % NA SOLN
1.0000 | NASAL | Status: DC | PRN
  Filled 2024-05-13: qty 44

## 2024-05-13 MED ORDER — OXYMETAZOLINE HCL 0.05 % NA SOLN
1.0000 | Freq: Two times a day (BID) | NASAL | Status: AC
  Administered 2024-05-13 – 2024-05-16 (×6): 1 via NASAL
  Filled 2024-05-13: qty 30

## 2024-05-13 MED ORDER — MORPHINE SULFATE ER 15 MG PO TBCR
15.0000 mg | EXTENDED_RELEASE_TABLET | Freq: Two times a day (BID) | ORAL | Status: DC
  Administered 2024-05-13 – 2024-05-18 (×11): 15 mg via ORAL
  Filled 2024-05-13 (×11): qty 1

## 2024-05-13 MED ORDER — IOHEXOL 350 MG/ML SOLN
75.0000 mL | Freq: Once | INTRAVENOUS | Status: AC | PRN
  Administered 2024-05-13: 75 mL via INTRAVENOUS

## 2024-05-13 MED ORDER — METOLAZONE 2.5 MG PO TABS
2.5000 mg | ORAL_TABLET | Freq: Once | ORAL | Status: AC
  Administered 2024-05-13: 2.5 mg via ORAL
  Filled 2024-05-13: qty 1

## 2024-05-13 NOTE — Progress Notes (Addendum)
 "                                                                                                                                                                                                                                                                                PROGRESS NOTE     Patient Demographics:    [name], is a 84 y.o. female, DOB - [birth year], FMW:[id number]  Outpatient Primary MD for the patient is [name], MD    LOS - 5  Admit date - 05/08/2024    Chief Complaint  Patient presents with   Shortness of Breath       Brief Narrative (HPI from H&P)    [name] is a 84 y.o. female with medical history significant of CHF, hypertension who presents emergency department with shortness of breath.  Patient is a poor historian however on arrival was found to be satting 65%.  Was placed on supplemental oxygen  with improvement.  She was endorsing a profound cough and found to be febrile.  She was recently seen by primary care doctor and given Levaquin  for presumed pneumonia.  On arrival labs were obtained which showed respiratory viral panel negative, potassium 5.2, glucose 300, WBC 12.2, hemoglobin 10.8, INR 1.1, BNP 1100, lactic acid 2.0.  Patient underwent CT chest which demonstrated pulmonary opacities.  Chest x-ray showed volume overload.  Patient was started on IV antibiotics admitted for further workup.  On evaluation patient had 3+ pitting edema and JVD.    Subjective:   Patient in bed denies any fever or chills, no headache or chest pain, still has shortness of breath and orthopnea.  No abdominal pain no focal weakness.   Assessment  & Plan :   Acute hypoxic respiratory failure due to combination of CAP and acute on chronic diastolic CHF EF 60% -   Failed outpatient Levaquin  treatment, antibiotics adjusted, main component appears to be of CHF, placed on IV Lasix  dose increased further on 05/11/2024, echo shows a EF of 60%, clinically improving with diuresis,  taper down antibiotics and continue diuresis with Lasix  IV dose increased Zaroxolyn  added on 05/13/2024.  Advance activity and monitor.  Encouraged to sit in  chair use I-S and flutter valve for pulmonary toiletry. For now continue antibiotics, Lasix , beta-blocker, ARB, Imdur  combination.    Despite aggressive diuresis she is still quite hypoxic with crackles and pulmonary edema on chest x-ray, diuretic dose increased on 05/13/2024, will repeat CTA to rule out PE clinical probability low.  Will get cardiology input as well   SpO2: (!) 89 % O2 Flow Rate (L/min): 7 L/min  # CAD history of bare metal stent placement in the proximal LAD, with subsequent cardiac catheterizations showing a patent stent, continue Plavix , beta-blocker and statin combination  # Generalized anxiety-continue Xanax    # Hypertension-continue amlodipine , Imdur , losartan , metoprolol    # Pancreatic insufficiency-continue Creon    # Chronic pain-continue gabapentin    # Ulcerative colitis-continue Lialda , budesonide , colestid   # Obesity with OSA.  BMI over 30.  Follow-up with PCP. BiPAP at bedtime.  # H/O Cor Pulmonale, ? ILD - on 3lits o2 at home with night time Trilogy Vent - supportive Rx, may need Pulm input.  # Vaginal discharge.  Seen by [facility] physician, stable vaginal swab, empiric Diflucan  given.  Much improved.      Condition - Guarded  Family Communication  : Called son Elsie [phone] - 05/10/2024 at 8:19 AM, called again on 05/12/2024 at 10:27 AM.  Mailbox full, updated again at 11.10 am., updated 05/13/24  Consults.  Cardiology, OB  Code Status : Full code      Procedures.    CT scan chest.  1. Scattered patchy pulmonary opacities, right lower lobe predominant, favoring multifocal atelectasis; superimposed right lower lobe pneumonia is possible but not favored. 2. Cardiomegaly. No interstitial edema. 3. Pulmonary arterial hypertension  Echocardiogram.  1. EF significantly improved since TTE done  04/26/22 . Left ventricular ejection fraction, by estimation, is 60 to 65%. The left ventricle has normal function. The left ventricle has no regional wall motion abnormalities. Left ventricular diastolic parameters are consistent with Grade I diastolic dysfunction (impaired relaxation). The average left ventricular global longitudinal strain is -21.6 %. The global longitudinal strain is normal.  2. Right ventricular systolic function is normal. The right ventricular size is normal.  3. Left atrial size was mildly dilated.  4. Right atrial size was mildly dilated.  5. The mitral valve is normal in structure. No evidence of mitral valve regurgitation. No evidence of mitral stenosis.  6. The aortic valve is tricuspid. There is mild calcification of the aortic valve. Aortic valve regurgitation is trivial. Aortic valve sclerosis is present, with no evidence of aortic valve stenosis.  7. The inferior vena cava is normal in size with greater than 50% respiratory variability, suggesting right atrial pressure of 3 mmHg   Disposition Plan  :    Status is: Inpatient   DVT Prophylaxis  :    enoxaparin  (LOVENOX ) injection 40 mg Start: 05/09/24 1000 SCDs Start: 05/08/24 2300   Lab Results  Component Value Date   PLT 535 (H) 05/13/2024    Diet :  Diet Order             Diet Carb Modified Room service appropriate? Yes  Diet effective now                    Inpatient Medications  Scheduled Meds:  amLODipine   10 mg Oral Daily   atorvastatin   20 mg Oral Daily   budesonide   9 mg Oral Daily   Chlorhexidine  Gluconate Cloth  6 each Topical Daily   clopidogrel   75 mg Oral Daily  colchicine   0.6 mg Oral BID   colestipol   2 g Oral BID   doxycycline   100 mg Oral Q12H   enoxaparin  (LOVENOX ) injection  40 mg Subcutaneous Daily   furosemide   80 mg Intravenous BID   gabapentin   300 mg Oral BID   insulin  aspart  0-5 Units Subcutaneous QHS   insulin  aspart  0-6 Units Subcutaneous TID WC   isosorbide   mononitrate  30 mg Oral Daily   lipase/protease/amylase  24,000 Units Oral TID with meals   losartan   25 mg Oral Daily   mesalamine   2.4 g Oral BID   metolazone   2.5 mg Oral Once   metoprolol  succinate  100 mg Oral Daily   morphine   15 mg Oral Q12H   nystatin    Topical BID   pantoprazole   40 mg Oral BID   tamsulosin   0.4 mg Oral QPC supper   Continuous Infusions:   PRN Meds:.acetaminophen  **OR** acetaminophen , ALPRAZolam , HYDROcodone  bit-homatropine, ipratropium-albuterol , ondansetron  **OR** ondansetron  (ZOFRAN ) IV  Antibiotics  :    Anti-infectives (From admission, onward)    Start     Dose/Rate Route Frequency Ordered Stop   05/11/24 1130  doxycycline  (VIBRA -TABS) tablet 100 mg        100 mg Oral Every 12 hours 05/11/24 1034     05/10/24 2330  vancomycin  (VANCOREADY) IVPB 1500 mg/300 mL  Status:  Discontinued       Placed in Followed by Linked Group   1,500 mg 150 mL/hr over 120 Minutes Intravenous Every 48 hours 05/08/24 2316 05/11/24 1034   05/10/24 1200  fluconazole  (DIFLUCAN ) tablet 150 mg        150 mg Oral  Once 05/10/24 1109 05/10/24 1259   05/10/24 1145  fluconazole  (DIFLUCAN ) tablet 100 mg  Status:  Discontinued        100 mg Oral Daily 05/10/24 1047 05/10/24 1215   05/09/24 0600  ceFEPIme  (MAXIPIME ) 2 g in sodium chloride  0.9 % 100 mL IVPB  Status:  Discontinued        2 g 200 mL/hr over 30 Minutes Intravenous Every 8 hours 05/08/24 2316 05/11/24 1034   05/08/24 2330  vancomycin  (VANCOREADY) IVPB 1750 mg/350 mL       Placed in Followed by Linked Group   1,750 mg 175 mL/hr over 120 Minutes Intravenous  Once 05/08/24 2316 05/09/24 0255   05/08/24 2130  doxycycline  (VIBRAMYCIN ) 100 mg in sodium chloride  0.9 % 250 mL IVPB  Status:  Discontinued        100 mg 125 mL/hr over 120 Minutes Intravenous  Once 05/08/24 2119 05/08/24 2137   05/08/24 2000  cefTRIAXone  (ROCEPHIN ) 2 g in sodium chloride  0.9 % 100 mL IVPB        2 g 200 mL/hr over 30 Minutes Intravenous Once  05/08/24 1956 05/08/24 2131   05/08/24 2000  azithromycin  (ZITHROMAX ) 500 mg in sodium chloride  0.9 % 250 mL IVPB  Status:  Discontinued        500 mg 250 mL/hr over 60 Minutes Intravenous  Once 05/08/24 1956 05/08/24 2119         Objective:   Vitals:   05/12/24 2355 05/13/24 0332 05/13/24 0758 05/13/24 0815  BP: 128/72 137/78 (!) 152/80   Pulse: 71   86  Resp: (!) 21  20   Temp: 98.6 F (37 C) 98.3 F (36.8 C) 98.6 F (37 C)   TempSrc: Oral Oral Oral   SpO2: 90%   (!) 89%    Wt Readings  from Last 3 Encounters:  02/27/24 85 kg  12/17/23 95.5 kg  10/10/23 88.9 kg     Intake/Output Summary (Last 24 hours) at 05/13/2024 1054 Last data filed at 05/13/2024 0112 Gross per 24 hour  Intake --  Output 950 ml  Net -950 ml     Physical Exam  Awake Alert, No new F.N deficits, Normal affect [place].AT,PERRAL Symmetrical Chest wall movement, Good air movement bilaterally, bibasilar crackles RRR,No Gallops,Rubs or new Murmurs,  +ve B.Sounds, Abd Soft, No tenderness,   No Cyanosis, Clubbing or edema      Data Review:    Recent Labs  Lab 05/08/24 2016 05/09/24 0337 05/11/24 0346 05/12/24 0339 05/13/24 0331  WBC 12.2* 13.0* 9.1 8.8 9.3  HGB 10.8* 10.2* 9.4* 9.5* 9.7*  HCT 33.9* 32.3* 29.6* 29.4* 29.8*  PLT 382 350 376 480* 535*  MCV 90.6 91.5 91.9 90.2 90.0  MCH 28.9 28.9 29.2 29.1 29.3  MCHC 31.9 31.6 31.8 32.3 32.6  RDW 17.2* 17.2* 16.1* 15.8* 15.7*  LYMPHSABS 1.3  --  1.0 1.1 1.7  MONOABS 1.0  --  0.8 0.8 0.9  EOSABS 0.1  --  0.2 0.2 0.2  BASOSABS 0.0  --  0.0 0.0 0.0    Recent Labs  Lab 05/08/24 2016 05/08/24 2107 05/09/24 0025 05/09/24 0337 05/10/24 0934 05/11/24 0346 05/12/24 0339 05/13/24 0331  NA 135  --   --  138 135 135 135 136  K 5.2*  --   --  4.1 3.7 4.1 3.3* 4.1  CL 101  --   --  104 99 100 99 100  CO2 23  --   --  23 24 24 27 28   ANIONGAP 11  --   --  11 13 11 10 8   GLUCOSE 256*  --   --  244* 221* 219* 238* 144*  BUN 8  --   --  7* 14  17 15 17   CREATININE 0.89  --   --  0.74 0.75 0.72 0.66 0.69  AST 22  --   --  18  --   --   --   --   ALT 7  --   --  5  --   --   --   --   ALKPHOS 78  --   --  72  --   --   --   --   BILITOT 0.5  --   --  0.4  --   --   --   --   ALBUMIN 3.5  --   --  3.2*  --   --   --   --   CRP  --   --   --   --  8.7* 5.5* 2.9* 1.4*  PROCALCITON  --   --   --   --  0.10 <0.10 <0.10 <0.10  LATICACIDVEN  --  2.0* 1.1  --   --   --   --   --   INR 1.1  --   --   --   --   --   --   --   MG  --   --   --   --   --  1.7 1.7 1.9  CALCIUM  8.6*  --   --  8.1* 7.9* 7.9* 8.1* 8.6*      Recent Labs  Lab 05/08/24 2016 05/08/24 2107 05/09/24 0025 05/09/24 9662 05/10/24 0934 05/11/24 0346 05/12/24 0339 05/13/24 0331  CRP  --   --   --   --  8.7* 5.5* 2.9* 1.4*  PROCALCITON  --   --   --   --  0.10 <0.10 <0.10 <0.10  LATICACIDVEN  --  2.0* 1.1  --   --   --   --   --   INR 1.1  --   --   --   --   --   --   --   MG  --   --   --   --   --  1.7 1.7 1.9  CALCIUM  8.6*  --   --  8.1* 7.9* 7.9* 8.1* 8.6*    --------------------------------------------------------------------------------------------------------------- Lab Results  Component Value Date   CHOL 146 06/16/2021   HDL 58.60 06/16/2021   LDLCALC 66 06/16/2021   TRIG 104.0 06/16/2021   CHOLHDL 2 06/16/2021    Lab Results  Component Value Date   HGBA1C 6.8 (A) 10/11/2022   No results for input(s): TSH, T4TOTAL, FREET4, T3FREE, THYROIDAB in the last 72 hours. No results for input(s): VITAMINB12, FOLATE, FERRITIN, TIBC, IRON , RETICCTPCT in the last 72 hours. ------------------------------------------------------------------------------------------------------------------ Cardiac Enzymes No results for input(s): CKMB, TROPONINI, MYOGLOBIN in the last 168 hours.  Invalid input(s): CK  Micro Results Recent Results (from the past 240 hours)  Resp panel by RT-PCR (RSV, Flu A&B, Covid) Anterior Nasal Swab      Status: None   Collection Time: 05/08/24  7:56 PM   Specimen: Anterior Nasal Swab  Result Value Ref Range Status   SARS Coronavirus 2 by RT PCR NEGATIVE NEGATIVE Final   Influenza A by PCR NEGATIVE NEGATIVE Final   Influenza B by PCR NEGATIVE NEGATIVE Final    Comment: (NOTE) The Xpert Xpress SARS-CoV-2/FLU/RSV plus assay is intended as an aid in the diagnosis of influenza from Nasopharyngeal swab specimens and should not be used as a sole basis for treatment. Nasal washings and aspirates are unacceptable for Xpert Xpress SARS-CoV-2/FLU/RSV testing.  Fact Sheet for Patients: bloggercourse.com  Fact Sheet for Healthcare Providers: seriousbroker.it  This test is not yet approved or cleared by the [place]  FDA and has been authorized for detection and/or diagnosis of SARS-CoV-2 by FDA under an Emergency Use Authorization (EUA). This EUA will remain in effect (meaning this test can be used) for the duration of the COVID-19 declaration under Section 564(b)(1) of the Act, 21 U.S.C. section 360bbb-3(b)(1), unless the authorization is terminated or revoked.     Resp Syncytial Virus by PCR NEGATIVE NEGATIVE Final    Comment: (NOTE) Fact Sheet for Patients: bloggercourse.com  Fact Sheet for Healthcare Providers: seriousbroker.it  This test is not yet approved or cleared by the [place]  FDA and has been authorized for detection and/or diagnosis of SARS-CoV-2 by FDA under an Emergency Use Authorization (EUA). This EUA will remain in effect (meaning this test can be used) for the duration of the COVID-19 declaration under Section 564(b)(1) of the Act, 21 U.S.C. section 360bbb-3(b)(1), unless the authorization is terminated or revoked.  Performed at [facility], [address] [place], [place] [address]   Blood Culture (routine x 2)     Status: None   Collection Time:  05/08/24  8:16 PM   Specimen: BLOOD RIGHT ARM  Result Value Ref Range Status   Specimen Description BLOOD RIGHT ARM  Final   Special Requests   Final    BOTTLES DRAWN AEROBIC AND ANAEROBIC Blood Culture results may not be optimal due to an inadequate volume of blood received in culture bottles   Culture  Final    NO GROWTH 5 DAYS Performed at [facility], [address] [place], [place] [address]    Report Status 05/13/2024 FINAL  Final  Blood Culture (routine x 2)     Status: None   Collection Time: 05/08/24  8:46 PM   Specimen: BLOOD RIGHT ARM  Result Value Ref Range Status   Specimen Description BLOOD RIGHT ARM  Final   Special Requests   Final    BOTTLES DRAWN AEROBIC AND ANAEROBIC Blood Culture results may not be optimal due to an inadequate volume of blood received in culture bottles   Culture   Final    NO GROWTH 5 DAYS Performed at [facility], [address], [place], [place] [address]    Report Status 05/13/2024 FINAL  Final  Urine Culture     Status: Abnormal   Collection Time: 05/08/24 10:58 PM   Specimen: Urine, Random  Result Value Ref Range Status   Specimen Description URINE, RANDOM  Final   Special Requests   Final    NONE Reflexed from [id number] Performed at [facility], [address] [place], [place] [address]    Culture (A)  Final    20,000 COLONIES/mL MULTIPLE SPECIES PRESENT, SUGGEST RECOLLECTION   Report Status 05/10/2024 FINAL  Final  MRSA Next Gen by PCR, Nasal     Status: None   Collection Time: 05/10/24  5:24 AM   Specimen: Nasal Mucosa; Nasal Swab  Result Value Ref Range Status   MRSA by PCR Next Gen NOT DETECTED NOT DETECTED Final    Comment: (NOTE) The GeneXpert MRSA Assay (FDA approved for NASAL specimens only), is one component of a comprehensive MRSA colonization surveillance program. It is not intended to diagnose MRSA infection nor to guide or monitor treatment for MRSA infections. Test performance is not FDA  approved in patients less than 37 years old. Performed at [facility], [address], [place], [place] [address]     Radiology Report DG Chest Port 1 View Result Date: 05/13/2024 EXAM: 1 VIEW(S) XRAY OF THE CHEST 05/13/2024 07:29:00 AM COMPARISON: 05/10/2024 CLINICAL HISTORY: The patient is experiencing shortness of breath. ICD10: 858119 - Shortness of breath. FINDINGS: LUNGS AND PLEURA: Mild pulmonary edema, slightly improved. Bibasilar opacities, slightly improved. Fluid in right minor fissure. No pneumothorax. HEART AND MEDIASTINUM: Heart size at upper limits of normal. Aortic atherosclerosis. Enlarged central pulmonary arteries. BONES AND SOFT TISSUES: No acute osseous abnormality. IMPRESSION: 1. Mild pulmonary edema, slightly improved. 2. Bibasilar opacities, slightly improved. 3. Enlarged central pulmonary arteries. Electronically signed by: Waddell Calk MD 05/13/2024 07:38 AM EST RP Workstation: [id number]      Signature  -   Lavada Stank DO on 05/13/2024 at 10:54 AM   -  To page go to [email or web address]   "

## 2024-05-13 NOTE — Plan of Care (Signed)

## 2024-05-13 NOTE — Consult Note (Signed)
 "  Cardiology Consultation   Patient ID: [name] MRN: [id number]; DOB: 29-Nov-1940  Admit date: 05/08/2024 Date of Consult: 05/13/2024  PCP:  [name] LABOR, MD   [facility] Providers Cardiologist:  Skains/Bensimohn     Patient Profile: [name] is a 84 y.o. female with a hx of CAD s/p BMS to LAD 2002 with residual disease 2023 managed medically, chronic HFimpEF, chronic edema, pulmonary HTN, chronic hypoxic and hypercapnic respiratory failure, prolonged QTc, HTN, HLD, DM, UC, morbid obesity, fibromyalgia, prior angiomyolipoma versus RCC, diverticulosis, pancreatitis, aspirin  intolerance who is being seen 05/13/2024 for the evaluation of CHF at the request of Dr. Dennise.  History of Present Illness: [name] had remote PCI in 2002. She was admitted 07/2021 for combined septic/cardiogenic shock, was bradycardic and hypotensive in ED requiring NE, IVF, atropine . Found to have PNA. EF was 35-40% by echo. Also had atrial runs/PACs, metoprolol  switched to atenolol . Cath during admission showed 90% dLAD, 30% prox RCA, 30% dRCA, 30% prox-mid LAD managed medically with moderate PAH suspected group 3, recommended for eval for OHS/OSA, discharged on Trilogy non-invasive ventilator followed by pulmonology. Repeat echo 03/2022 showed EF 50-55%. Last pulm visit 09/2023 indicated 33% compliance with Trilogy nocturnal ventilator, advised to use nightly, SOB felt multifactorial.  She presented to the ED with severe shortness of breath, cough, and fever. She had recently seen PCP for OP management of presumed PNA. O2 sat 65% on arrival to ER, placed on supplemental O2. Started on adjusted antibiotic regimen and IV diuresis. 2D echo this admission shows EF 60-65% improved from prior, G1DD, mild BAE, aortic sclerosis without stenosis. Initial labs showed K 5.2 possibly hemolyzed, pBNP 1166, lactate 2, leukocytosis of 12.2, Covid/flu/RSV neg, CRP 8.7, pro-cal 0.10,Hgb 9-10s, calcium  7-8s. No  troponin. No weights on file. Net output this far -6.4L but at times intake not charted. Due to continued hypoxia despite diuresis, she had repeat imaging today. CXR showed mild pulmonary edema and bibasilar opacities with enlarged PA. CTA described the following:   IMPRESSION: 1. No evidence of pulmonary embolus. 2. 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 Recommend correlation with echocardiography and pulmonary hypertension workup, and cardiology/pulmonology consultation; consider follow-up CTA or MRA in 6-12 months to assess stability (sooner if symptoms). 3. Ground-glass opacities within the lungs, more pronounced on the right; differential includes edema, infection/inflammation (including viral/atypical), aspiration, or hemorrhage. Recommend clinical correlation and follow-up chest CT in ~8-12 weeks to document resolution if clinically indicated. 4. Streaky dependent peribronchovascular opacities with bronchiectasis in the lower lobes bilaterally; differential includes chronic aspiration, postinfectious/postinflammatory change, or chronic small airways disease. Recommend correlation for aspiration risk and pulmonary follow-up as clinically indicated. 5. Enlarged heart with moderate calcific coronary artery disease.   Cardiology asked to assist with care.  Patient is obese but can get around her house. She is on 3L of oxygen  since 2023 and bipap at night She does Admit to chocking on foods and likely aspiration.   Past Medical History:  Diagnosis Date   Adenomatous colon polyp 02/2006   Allergy    Allergy, unspecified not elsewhere classified    Anemia    Anxiety    Arthritis    Asthma    Blood transfusion without reported diagnosis     Bronchiectasis    CAD (coronary artery disease)    BMS to  the LAD, 2002; cath 12/07/11 patent LAD stent and mild nonobstructive disease, EF 65%; Medical management   Cataract    removed both eyes   CHF (congestive heart failure) (HCC)    Diastolic   Chronic diastolic CHF (congestive heart failure) (HCC) 11/25/2011   Clotting disorder    in legs per pt    Diabetes mellitus    Diverticulosis of colon    DJD (degenerative joint disease)    Fibromyalgia    GERD (gastroesophageal reflux disease)    H. pylori infection 05/12/2019   Hypercholesterolemia    Hypertension    Microscopic hematuria    Neoplasm of kidney    Obesity    Other diseases of lung, not elsewhere classified    Pinched vertebral nerve    Pulmonary nodule    Negative PET in 2010   Stress incontinence, female    Syncope    Ulcerative colitis, left sided (HCC) 2008   Hx of   UTI (lower urinary tract infection)    Vitamin D  deficiency disease     Past Surgical History:  Procedure Laterality Date   ABDOMINAL HYSTERECTOMY     CARDIAC CATHETERIZATION  11/2011   CATARACT EXTRACTION, BILATERAL  2020   CHOLECYSTECTOMY     COLONOSCOPY  01/09/2018   per Dr. Aneita, adenomatous polyps, repeat in 3 yrs   CORONARY STENT PLACEMENT  2002   BMS to the LAD   ESOPHAGOGASTRODUODENOSCOPY  12/2008   HAND SURGERY  01/2006   Right Hand Surgery by Dr. Camella   LEFT HEART CATHETERIZATION WITH CORONARY ANGIOGRAM N/A 12/07/2011   Procedure: LEFT HEART CATHETERIZATION WITH CORONARY ANGIOGRAM;  Surgeon: [name] Phillippi M Jordan, MD;  Location: [facility] CATH LAB;  Service: Cardiovascular;  Laterality: N/A;   RIGHT/LEFT HEART CATH AND CORONARY ANGIOGRAPHY N/A 08/07/2021   Procedure: RIGHT/LEFT HEART CATH AND CORONARY ANGIOGRAPHY;  Surgeon: [name] Toribio SAUNDERS, MD;  Location: [facility] INVASIVE CV LAB;  Service: Cardiovascular;  Laterality: N/A;   UPPER GASTROINTESTINAL ENDOSCOPY        Scheduled Meds:  amLODipine   10 mg Oral Daily   atorvastatin   20 mg Oral  Daily   budesonide   9 mg Oral Daily   Chlorhexidine  Gluconate Cloth  6 each Topical Daily   clopidogrel   75 mg Oral Daily   colchicine   0.6 mg Oral BID   colestipol   2 g Oral BID   doxycycline   100 mg Oral Q12H   enoxaparin  (LOVENOX ) injection  40 mg Subcutaneous Daily   furosemide   80 mg Intravenous BID   gabapentin   300 mg Oral BID   insulin  aspart  0-5 Units Subcutaneous QHS   insulin  aspart  0-6 Units Subcutaneous TID WC   isosorbide  mononitrate  30 mg Oral Daily   lipase/protease/amylase  24,000 Units Oral TID with meals   losartan   25 mg Oral Daily   mesalamine   2.4 g Oral BID   metoprolol  succinate  100 mg Oral Daily   morphine   15 mg Oral Q12H   nystatin    Topical BID   oxymetazoline   1 spray Each Nare BID   pantoprazole   40 mg Oral BID   tamsulosin   0.4 mg Oral QPC supper   Continuous Infusions:  PRN Meds: acetaminophen  **OR** acetaminophen , ALPRAZolam , HYDROcodone  bit-homatropine, ipratropium-albuterol , ondansetron  **OR** ondansetron  (ZOFRAN ) IV, sodium chloride   Allergies:   Allergies[1]  Social History:   Social History   Socioeconomic History   Marital status: Divorced    Spouse name: Not on file   Number of children:  3   Years of education: Not on file   Highest education level: Not on file  Occupational History   Occupation: Retired    Associate Professor: RETIRED  Tobacco Use   Smoking status: Former    Current packs/day: 0.00    Average packs/day: 2.0 packs/day for 15.0 years (30.0 ttl pk-yrs)    Types: Cigarettes    Start date: 04/30/1958    Quit date: 04/30/1973    Years since quitting: 51.0    Passive exposure: Past   Smokeless tobacco: Never   Tobacco comments:    ex-smoker: smoked for 10-15 years up to 2ppd, quit 1975.  Vaping Use   Vaping status: Never Used  Substance and Sexual Activity   Alcohol  use: Not Currently    Alcohol /week: 0.0 standard drinks of alcohol     Comment: none now    Drug use: No   Sexual activity: Not Currently  Other Topics  Concern   Not on file  Social History Narrative   Right handed   Social Drivers of Health   Tobacco Use: Medium Risk (05/08/2024)   Patient History    Smoking Tobacco Use: Former    Smokeless Tobacco Use: Never    Passive Exposure: Past  Physicist, Medical Strain: Low Risk (05/31/2023)   Overall Financial Resource Strain (CARDIA)    Difficulty of Paying Living Expenses: Not hard at all  Food Insecurity: No Food Insecurity (05/09/2024)   Epic    Worried About Programme Researcher, Broadcasting/film/video in the Last Year: Never true    Ran Out of Food in the Last Year: Never true  Transportation Needs: No Transportation Needs (05/09/2024)   Epic    Lack of Transportation (Medical): No    Lack of Transportation (Non-Medical): No  Physical Activity: Inactive (05/31/2023)   Exercise Vital Sign    Days of Exercise per Week: 0 days    Minutes of Exercise per Session: 0 min  Stress: No Stress Concern Present (05/31/2023)   [employer] of Occupational Health - Occupational Stress Questionnaire    Feeling of Stress : Not at all  Social Connections: Moderately Integrated (05/09/2024)   Social Connection and Isolation Panel    Frequency of Communication with Friends and Family: Three times a week    Frequency of Social Gatherings with Friends and Family: Three times a week    Attends Religious Services: More than 4 times per year    Active Member of Clubs or Organizations: Yes    Attends Banker Meetings: More than 4 times per year    Marital Status: Widowed  Intimate Partner Violence: Not At Risk (05/09/2024)   Epic    Fear of Current or Ex-Partner: No    Emotionally Abused: No    Physically Abused: No    Sexually Abused: No  Depression (PHQ2-9): Low Risk (05/31/2023)   Depression (PHQ2-9)    PHQ-2 Score: 0  Alcohol  Screen: Low Risk (05/31/2023)   Alcohol  Screen    Last Alcohol  Screening Score (AUDIT): 0  Housing: Low Risk (05/09/2024)   Epic    Unable to Pay for Housing in the Last Year: No     Number of Times Moved in the Last Year: 0    Homeless in the Last Year: No  Utilities: Not At Risk (05/09/2024)   Epic    Threatened with loss of utilities: No  Health Literacy: Adequate Health Literacy (05/31/2023)   B1300 Health Literacy    Frequency of need for help with medical instructions: Never  Family History:    Family History  Problem Relation Age of Onset   Pneumonia Father    Heart attack Father    Colon polyps Father    Parkinsonism Mother    Diabetes Brother    Sarcoidosis Brother    Hypertension Sister    Diabetes Sister    Breast cancer Daughter    Colon cancer Neg Hx    Esophageal cancer Neg Hx    Rectal cancer Neg Hx    Stomach cancer Neg Hx      ROS:  Please see the history of present illness.   All other ROS reviewed and negative.     Physical Exam/Data: Vitals:   05/13/24 1112 05/13/24 1115 05/13/24 1200 05/13/24 1554  BP: 122/64 122/64 (!) 144/85 (!) 141/69  Pulse: 87 86 84 87  Resp:  20 (!) 21   Temp:  98.9 F (37.2 C)  98.5 F (36.9 C)  TempSrc:  Oral  Oral  SpO2:  92% 93%     Intake/Output Summary (Last 24 hours) at 05/13/2024 1712 Last data filed at 05/13/2024 1111 Gross per 24 hour  Intake --  Output 1900 ml  Net -1900 ml      02/27/2024   11:04 AM 12/17/2023    8:57 AM 10/10/2023    3:27 PM  Last 3 Weights  Weight (lbs) 187 lb 5 oz 210 lb 9.6 oz 196 lb  Weight (kg) 84.964 kg 95.528 kg 88.905 kg     Obese black female Slow verbalization  JVP hard to assess No significant murmur Abdomen benign Pockets of egophony and rhonchi with wheezing on lung exam Plus one edema   Relevant CV Studies:  Echocardiogram:  05/09/24    1. EF significantly improved since TTE done 04/26/22 . Left ventricular  ejection fraction, by estimation, is 60 to 65%. The left ventricle has  normal function. The left ventricle has no regional wall motion  abnormalities. Left ventricular diastolic  parameters are consistent with Grade I diastolic  dysfunction (impaired  relaxation). The average left ventricular global longitudinal strain is  -21.6 %. The global longitudinal strain is normal.   2. Right ventricular systolic function is normal. The right ventricular  size is normal.   3. Left atrial size was mildly dilated.   4. Right atrial size was mildly dilated.   5. The mitral valve is normal in structure. No evidence of mitral valve  regurgitation. No evidence of mitral stenosis.   6. The aortic valve is tricuspid. There is mild calcification of the  aortic valve. Aortic valve regurgitation is trivial. Aortic valve  sclerosis is present, with no evidence of aortic valve stenosis.   7. The inferior vena cava is normal in size with greater than 50%  respiratory variability, suggesting right atrial pressure of 3 mmHg.   Heart Catheterization 08/07/2021   Conclusion      Dist LAD lesion is 90% stenosed.   Prox RCA lesion is 30% stenosed.   Dist RCA lesion is 30% stenosed.   Prox LAD to Mid LAD lesion is 30% stenosed.   The left ventricular ejection fraction is 55-65% by visual estimate.   Findings:   Ao = 167/84 (118) LV = 152/11 RA =  8 RV = 64/11 PA = 68/26 (45) PCW = 15 Fick cardiac output/index = 6.0/3.0 PVR = 5.0 WU FA sat = 92% PA sat = 66%, 67%   Assessment: 1. Predominantly mild to moderate non-obstructive CAD with 90% lesion in apical LAD  2. Normal LV function 3. Moderate PAH   Plan/Discussion:    Medical therapy for CAD. Likely Group 3 PAH. Will need evaluation for OSA/OHS  Laboratory Data:   Chemistry Recent Labs  Lab 05/11/24 0346 05/12/24 0339 05/13/24 0331  NA 135 135 136  K 4.1 3.3* 4.1  CL 100 99 100  CO2 24 27 28   GLUCOSE 219* 238* 144*  BUN 17 15 17   CREATININE 0.72 0.66 0.69  CALCIUM  7.9* 8.1* 8.6*  MG 1.7 1.7 1.9  GFRNONAA >60 >60 >60  ANIONGAP 11 10 8     Recent Labs  Lab 05/08/24 2016 05/09/24 0337  PROT 6.7 6.2*  ALBUMIN 3.5 3.2*  AST 22 18  ALT 7 5  ALKPHOS 78 72   BILITOT 0.5 0.4   Lipids No results for input(s): CHOL, TRIG, HDL, LABVLDL, LDLCALC, CHOLHDL in the last 168 hours.  Hematology Recent Labs  Lab 05/11/24 0346 05/12/24 0339 05/13/24 0331  WBC 9.1 8.8 9.3  RBC 3.22* 3.26* 3.31*  HGB 9.4* 9.5* 9.7*  HCT 29.6* 29.4* 29.8*  MCV 91.9 90.2 90.0  MCH 29.2 29.1 29.3  MCHC 31.8 32.3 32.6  RDW 16.1* 15.8* 15.7*  PLT 376 480* 535*   Thyroid  No results for input(s): TSH, FREET4 in the last 168 hours.  BNP Recent Labs  Lab 05/08/24 2016 05/10/24 0934 05/13/24 0331  PROBNP 1,166.0* 369.0* 258.0     Radiology/Studies:  CT Angio Chest Pulmonary Embolism (PE) W or WO Contrast Result Date: 05/13/2024 EXAM: CTA CHEST 05/13/2024 12:08:00 PM TECHNIQUE: CTA of the chest was performed after the administration of intravenous contrast. Multiplanar reformatted images are provided for review. MIP images are provided for review. Automated exposure control, iterative reconstruction, and/or weight based adjustment of the mA/kV was utilized to reduce the radiation dose to as low as reasonably achievable. 75 mL of iohexol  (OMNIPAQUE ) 350 MG/ML injection was administered. COMPARISON: CT of the chest dated 05/08/2024. CLINICAL HISTORY: Pulmonary embolism (PE) suspected, low to intermediate prob, neg D-dimer. Pulmonary embolism is suspected with low to intermediate probability. D-dimer test is negative. FINDINGS: PULMONARY ARTERIES: Pulmonary arteries are adequately opacified for evaluation. No acute pulmonary embolus. 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 MEDIASTINUM: The heart is enlarged and there is moderate calcific coronary artery disease present. The thoracic aorta is normal in caliber, but demonstrates moderate calcific atheromatous disease. The descending thoracic aorta is mildly tortuous. The pericardium demonstrates no acute abnormality. LYMPH NODES: No  mediastinal, hilar or axillary lymphadenopathy. LUNGS AND PLEURA: There are ground-glass opacities within the lungs, more pronounced on the right. There are also streaky peribronchovascular opacities with bronchiectasis present dependently within the lower lobes bilaterally. No focal consolidation or pulmonary edema. No evidence of pleural effusion or pneumothorax. Based on these findings, the most likely differential diagnoses include chronic interstitial lung disease (e.g., non-specific interstitial pneumonia, usual interstitial pneumonia, hypersensitivity pneumonitis) or chronic infection (e.g., atypical mycobacteria). Appropriate follow-up guidelines include pulmonary function tests (PFTs), high-resolution CT (HRCT) of the chest for further characterization of interstitial lung disease, and consideration of consultation with a pulmonologist. UPPER ABDOMEN: Limited images of the upper abdomen are unremarkable. SOFT TISSUES AND BONES: No acute bone or soft tissue abnormality. IMPRESSION: 1. No evidence of pulmonary embolus. 2. Aneurysmal dilatation of the main pulmonary artery measuring up to approximately 5.5 cm; most commonly associated with pulmonary arterial hypertension (primary or secondary), congenital left-to-right shunt, and less commonly vasculitis/connective tissue disease or  idiopathic. Recommend correlation with echocardiography and pulmonary hypertension workup, and cardiology/pulmonology consultation; consider follow-up CTA or MRA in 6-12 months to assess stability (sooner if symptoms). 3. Ground-glass opacities within the lungs, more pronounced on the right; differential includes edema, infection/inflammation (including viral/atypical), aspiration, or hemorrhage. Recommend clinical correlation and follow-up chest CT in ~8-12 weeks to document resolution if clinically indicated. 4. Streaky dependent peribronchovascular opacities with bronchiectasis in the lower lobes bilaterally; differential  includes chronic aspiration, postinfectious/postinflammatory change, or chronic small airways disease. Recommend correlation for aspiration risk and pulmonary follow-up as clinically indicated. 5. Enlarged heart with moderate calcific coronary artery disease. Electronically signed by: Evalene Coho MD 05/13/2024 12:53 PM EST RP Workstation: [id number]   DG Chest Port 1 View Result Date: 05/13/2024 EXAM: 1 VIEW(S) XRAY OF THE CHEST 05/13/2024 07:29:00 AM COMPARISON: 05/10/2024 CLINICAL HISTORY: The patient is experiencing shortness of breath. ICD10: 858119 - Shortness of breath. FINDINGS: LUNGS AND PLEURA: Mild pulmonary edema, slightly improved. Bibasilar opacities, slightly improved. Fluid in right minor fissure. No pneumothorax. HEART AND MEDIASTINUM: Heart size at upper limits of normal. Aortic atherosclerosis. Enlarged central pulmonary arteries. BONES AND SOFT TISSUES: No acute osseous abnormality. IMPRESSION: 1. Mild pulmonary edema, slightly improved. 2. Bibasilar opacities, slightly improved. 3. Enlarged central pulmonary arteries. Electronically signed by: Waddell Calk MD 05/13/2024 07:38 AM EST RP Workstation: [id number]   DG Chest Port 1 View Result Date: 05/10/2024 EXAM: 1 VIEW XRAY OF THE CHEST 05/10/2024 06:32:31 AM COMPARISON: 05/08/2024 CLINICAL HISTORY: 84 year old female with shortness of breath. FINDINGS: LUNGS AND PLEURA: No significant effusions. Diffuse interstitial opacities with patchy airspace opacities. Band-like opacity in right mid lung. No pneumothorax. HEART AND MEDIASTINUM: Cardiomegaly. Aortic atherosclerotic calcifications. BONES AND SOFT TISSUES: No acute osseous abnormality. IMPRESSION: 1. Unresolved pulmonary edema with new atelectasis or trace pleural fluid along the right minor fissure. Electronically signed by: Helayne Hurst MD MD 05/10/2024 06:46 AM EST RP Workstation: [id number]     Assessment and Plan: Hypoxia:  there may be a component of diastolic dysfunction  as evidenced by initially elevated BNP 1166 that has improved with good diuresis to 258. However she still has hypoxia despite good diuresis. She has known WHO Class 3 PHT by cath 08/07/21 moderate  PA 68/26 mmHg with PAd significantly higher than PCW of 15 mmHg At that time PVR was 5.0 WU's. Her CT is pretty abnormal with interstitial lung changes and bronchiectasis with no effusion or current fluid. Her main PA is severely dilated over 5 cm again suggesting chronic PHT. There is no ASD/VSD on echo CTA shows normal PV drainage. I doubt there is a significant cardiac shunt. Will order echo with bubble study supine and sitting up to assess for platypnea orthodeoxia. I tried to call her son Olubunmi Rothenberger but went to voicemail that was full. She lives with him She is willing to have repeat right heart cath. She needs to get a pulmonary consultation Sees Dr Neda Suspect we will try to get right heart cath done Friday  CAD:  cath 2023 with only distal LAD stenosis 90% and echo with normal LV function not playing a role   Maude Emmer MD [facility]     [1]  Allergies Allergen Reactions   Doxycycline  Other (See Comments)    Unsteady gait   Aspirin  Nausea And Vomiting   Azithromycin  Other (See Comments)    Unknown reaction per family member messed her up   Caffeine Nausea And Vomiting   Ciprofloxacin Nausea And Vomiting    Tolerates levaquin   Codeine Nausea And Vomiting   Oxycodone  Other (See Comments)    Pt stated, upsets my stomach (11/19/17)   Sulfamethizole Other (See Comments)    Unknown reaction   Tramadol Hcl Other (See Comments)    REACTION: pt states spaced-out   "

## 2024-05-14 ENCOUNTER — Inpatient Hospital Stay (HOSPITAL_COMMUNITY)

## 2024-05-14 DIAGNOSIS — J9621 Acute and chronic respiratory failure with hypoxia: Secondary | ICD-10-CM | POA: Diagnosis not present

## 2024-05-14 DIAGNOSIS — Z87891 Personal history of nicotine dependence: Secondary | ICD-10-CM | POA: Diagnosis not present

## 2024-05-14 DIAGNOSIS — J159 Unspecified bacterial pneumonia: Secondary | ICD-10-CM | POA: Diagnosis not present

## 2024-05-14 DIAGNOSIS — Z8679 Personal history of other diseases of the circulatory system: Secondary | ICD-10-CM | POA: Diagnosis not present

## 2024-05-14 DIAGNOSIS — R0609 Other forms of dyspnea: Secondary | ICD-10-CM | POA: Diagnosis not present

## 2024-05-14 DIAGNOSIS — J45909 Unspecified asthma, uncomplicated: Secondary | ICD-10-CM | POA: Diagnosis not present

## 2024-05-14 DIAGNOSIS — I2510 Atherosclerotic heart disease of native coronary artery without angina pectoris: Secondary | ICD-10-CM | POA: Diagnosis not present

## 2024-05-14 DIAGNOSIS — I272 Pulmonary hypertension, unspecified: Secondary | ICD-10-CM

## 2024-05-14 LAB — BASIC METABOLIC PANEL WITH GFR
Anion gap: 11 (ref 5–15)
BUN: 21 mg/dL (ref 8–23)
CO2: 27 mmol/L (ref 22–32)
Calcium: 9 mg/dL (ref 8.9–10.3)
Chloride: 98 mmol/L (ref 98–111)
Creatinine, Ser: 0.94 mg/dL (ref 0.44–1.00)
GFR, Estimated: >60 mL/min
Glucose, Bld: 150 mg/dL — ABNORMAL HIGH (ref 70–99)
Potassium: 3.7 mmol/L (ref 3.5–5.1)
Sodium: 136 mmol/L (ref 135–145)

## 2024-05-14 LAB — C-REACTIVE PROTEIN: CRP: 0.8 mg/dL

## 2024-05-14 LAB — CBC WITH DIFFERENTIAL/PLATELET
Abs Immature Granulocytes: 0.09 K/uL — ABNORMAL HIGH (ref 0.00–0.07)
Basophils Absolute: 0 K/uL (ref 0.0–0.1)
Basophils Relative: 0 %
Eosinophils Absolute: 0.2 K/uL (ref 0.0–0.5)
Eosinophils Relative: 2 %
HCT: 31.5 % — ABNORMAL LOW (ref 36.0–46.0)
Hemoglobin: 10.1 g/dL — ABNORMAL LOW (ref 12.0–15.0)
Immature Granulocytes: 1 %
Lymphocytes Relative: 18 %
Lymphs Abs: 1.8 K/uL (ref 0.7–4.0)
MCH: 28.8 pg (ref 26.0–34.0)
MCHC: 32.1 g/dL (ref 30.0–36.0)
MCV: 89.7 fL (ref 80.0–100.0)
Monocytes Absolute: 0.9 K/uL (ref 0.1–1.0)
Monocytes Relative: 9 %
Neutro Abs: 7.1 K/uL (ref 1.7–7.7)
Neutrophils Relative %: 70 %
Platelets: 630 K/uL — ABNORMAL HIGH (ref 150–400)
RBC: 3.51 MIL/uL — ABNORMAL LOW (ref 3.87–5.11)
RDW: 15.9 % — ABNORMAL HIGH (ref 11.5–15.5)
WBC: 10.1 K/uL (ref 4.0–10.5)
nRBC: 0 % (ref 0.0–0.2)

## 2024-05-14 LAB — GLUCOSE, CAPILLARY
Glucose-Capillary: 132 mg/dL — ABNORMAL HIGH (ref 70–99)
Glucose-Capillary: 218 mg/dL — ABNORMAL HIGH (ref 70–99)
Glucose-Capillary: 276 mg/dL — ABNORMAL HIGH (ref 70–99)
Glucose-Capillary: 337 mg/dL — ABNORMAL HIGH (ref 70–99)

## 2024-05-14 LAB — LEGIONELLA PNEUMOPHILA SEROGP 1 UR AG: L. pneumophila Serogp 1 Ur Ag: NEGATIVE

## 2024-05-14 LAB — ECHOCARDIOGRAM LIMITED: Weight: 3118.19 [oz_av]

## 2024-05-14 LAB — PROCALCITONIN: Procalcitonin: <0.1 ng/mL

## 2024-05-14 LAB — HEMOGLOBIN A1C
Hgb A1c MFr Bld: 11.5 % — ABNORMAL HIGH (ref 4.8–5.6)
Mean Plasma Glucose: 283.35 mg/dL

## 2024-05-14 LAB — MAGNESIUM: Magnesium: 1.7 mg/dL (ref 1.7–2.4)

## 2024-05-14 MED ORDER — ALUM & MAG HYDROXIDE-SIMETH 200-200-20 MG/5ML PO SUSP
30.0000 mL | ORAL | Status: DC | PRN
  Filled 2024-05-14: qty 30

## 2024-05-14 MED ORDER — FLUTICASONE FUROATE-VILANTEROL 200-25 MCG/ACT IN AEPB
1.0000 | INHALATION_SPRAY | Freq: Every day | RESPIRATORY_TRACT | Status: DC
  Administered 2024-05-16 – 2024-05-18 (×3): 1 via RESPIRATORY_TRACT
  Filled 2024-05-14: qty 28

## 2024-05-14 MED ORDER — INSULIN ASPART 100 UNIT/ML IJ SOLN
0.0000 [IU] | Freq: Three times a day (TID) | INTRAMUSCULAR | Status: DC
  Administered 2024-05-14: 8 [IU] via SUBCUTANEOUS
  Administered 2024-05-15: 3 [IU] via SUBCUTANEOUS
  Administered 2024-05-15: 8 [IU] via SUBCUTANEOUS
  Administered 2024-05-16: 11 [IU] via SUBCUTANEOUS
  Administered 2024-05-16: 8 [IU] via SUBCUTANEOUS
  Administered 2024-05-16 – 2024-05-17 (×2): 5 [IU] via SUBCUTANEOUS
  Administered 2024-05-17 (×2): 8 [IU] via SUBCUTANEOUS
  Filled 2024-05-14 (×9): qty 1

## 2024-05-14 MED ORDER — FREE WATER
250.0000 mL | Freq: Once | Status: AC
  Administered 2024-05-15: 250 mL via ORAL

## 2024-05-14 MED ORDER — ASPIRIN 81 MG PO CHEW
81.0000 mg | CHEWABLE_TABLET | ORAL | Status: DC
  Filled 2024-05-14: qty 1

## 2024-05-14 MED ORDER — INSULIN ASPART 100 UNIT/ML IJ SOLN
0.0000 [IU] | Freq: Every day | INTRAMUSCULAR | Status: DC
  Administered 2024-05-14: 4 [IU] via SUBCUTANEOUS
  Administered 2024-05-15: 3 [IU] via SUBCUTANEOUS
  Administered 2024-05-16: 5 [IU] via SUBCUTANEOUS
  Administered 2024-05-17: 2 [IU] via SUBCUTANEOUS
  Filled 2024-05-14: qty 1
  Filled 2024-05-14: qty 5
  Filled 2024-05-14: qty 3
  Filled 2024-05-14: qty 1

## 2024-05-14 MED ORDER — METHYLPREDNISOLONE SODIUM SUCC 125 MG IJ SOLR
60.0000 mg | INTRAMUSCULAR | Status: DC
  Administered 2024-05-14 – 2024-05-16 (×3): 60 mg via INTRAVENOUS
  Filled 2024-05-14 (×3): qty 2

## 2024-05-14 NOTE — Progress Notes (Signed)
 Echocardiogram 2D Echocardiogram has been performed.  Juliene JINNY Rucks 05/14/2024, 11:07 AM

## 2024-05-14 NOTE — Progress Notes (Signed)
 Physical Therapy Treatment Patient Details Name: [name] MRN: [id number] DOB: [birth year] Today's Date: 05/14/2024   History of Present Illness [name] is a 84 y.o. female who presents emergency department with shortness of breath.  Patient is a poor historian however on arrival was found to be satting 65%.  Was placed on supplemental oxygen  with improvement.  She was endorsing a profound cough and found to be febrile.  She was recently seen by primary care doctor and given Levaquin  for presumed pneumonia; with medical history significant of CHF, hypertension.    PT Comments  Pt received in supine and agreeable to session. Pt demonstrates impaired problem solving and initiation throughout session requiring increased time and cues to complete mobility tasks. Pt able to stand and ambulate short distance to the sink, but requires a seated rest break due to fatigue. Pt able to tolerate extended standing trial at the sink, but requires forearm support due to weakness. Pt able to pivot back to bed with cues for safety and sequencing. Due to poor activity tolerance, weakness, and poor safety awareness, patient will benefit from continued inpatient follow up therapy, <3 hours/day. Pt continues to benefit from PT services to progress toward functional mobility goals.    If plan is discharge home, recommend the following: A lot of help with walking and/or transfers;A lot of help with bathing/dressing/bathroom;Help with stairs or ramp for entrance;Assist for transportation   Can travel by private vehicle     No  Equipment Recommendations  Rolling walker (2 wheels);BSC/3in1;Wheelchair (measurements PT);Wheelchair cushion (measurements PT)    Recommendations for Other Services       Precautions / Restrictions Precautions Precautions: Fall Recall of Precautions/Restrictions: Impaired Precaution/Restrictions Comments: watch O2 sats Restrictions Weight Bearing Restrictions Per Provider Order:  No     Mobility  Bed Mobility Overal bed mobility: Needs Assistance Bed Mobility: Supine to Sit, Sit to Supine     Supine to sit: Contact guard, HOB elevated, Used rails, Min assist Sit to supine: Min assist   General bed mobility comments: increased time and use of bed features. Assist to scoot forward to EOB    Transfers Overall transfer level: Needs assistance Equipment used: Rollator (4 wheels) Transfers: Sit to/from Stand Sit to Stand: Mod assist, Min assist           General transfer comment: EOB elevated, decreased inititation, problem solving and sequencing noted. generalized weakness. Cues for hand placement, but pt continuing to hold rollator brakes. Improved power up from chair with hands on arm rests    Ambulation/Gait Ambulation/Gait assistance: Min assist, +2 safety/equipment Gait Distance (Feet): 5 Feet (x2) Assistive device: Rollator (4 wheels) Gait Pattern/deviations: Step-through pattern, Decreased stride length, Trunk flexed, Wide base of support       General Gait Details: Pt demonstrates short, low steps with cues for safety and assist for balance and line management. Pt rests forearms on rollator handles despite cues for safety   Stairs             Wheelchair Mobility     Tilt Bed    Modified Rankin (Stroke Patients Only)       Balance Overall balance assessment: Needs assistance Sitting-balance support: Feet supported, Bilateral upper extremity supported Sitting balance-Leahy Scale: Fair Sitting balance - Comments: EOB. Pt tends to lean onto L forearm for support   Standing balance support: Reliant on assistive device for balance, Bilateral upper extremity supported, During functional activity Standing balance-Leahy Scale: Poor Standing balance comment: reliant on rollator and  external support dynamically                            Communication Communication Communication: Impaired Factors Affecting  Communication: Reduced clarity of speech  Cognition Arousal: Alert Behavior During Therapy: Flat affect   PT - Cognitive impairments: Awareness, Attention, Initiation, Sequencing, Problem solving, Safety/Judgement                       PT - Cognition Comments: increased time for processing and responses requiring increased cues. Following commands: Intact Following commands impaired: Follows one step commands with increased time    Cueing Cueing Techniques: Verbal cues, Gestural cues  Exercises      General Comments General comments (skin integrity, edema, etc.): SpO2 89-90% on 8L when pleth reliable at rest. Unable to obtain reliable reading during mobility.      Pertinent Vitals/Pain Pain Assessment Pain Assessment: Faces Faces Pain Scale: Hurts a little bit Pain Location: generalized Pain Descriptors / Indicators: Discomfort Pain Intervention(s): Monitored during session     PT Goals (current goals can now be found in the care plan section) Acute Rehab PT Goals Patient Stated Goal: Did not state PT Goal Formulation: Patient unable to participate in goal setting Time For Goal Achievement: 05/24/24 Progress towards PT goals: Progressing toward goals    Frequency    Min 2X/week      PT Plan      Co-evaluation PT/OT/SLP Co-Evaluation/Treatment: Yes Reason for Co-Treatment: To address functional/ADL transfers;For patient/therapist safety;Necessary to address cognition/behavior during functional activity PT goals addressed during session: Mobility/safety with mobility;Proper use of DME OT goals addressed during session: ADL's and self-care;Strengthening/ROM;Proper use of Adaptive equipment and DME      AM-PAC PT 6 Clicks Mobility   Outcome Measure  Help needed turning from your back to your side while in a flat bed without using bedrails?: A Little Help needed moving from lying on your back to sitting on the side of a flat bed without using bedrails?:  A Little Help needed moving to and from a bed to a chair (including a wheelchair)?: A Little Help needed standing up from a chair using your arms (e.g., wheelchair or bedside chair)?: A Lot Help needed to walk in hospital room?: Total Help needed climbing 3-5 steps with a railing? : Total 6 Click Score: 13    End of Session Equipment Utilized During Treatment: Gait belt;Oxygen  Activity Tolerance: Patient limited by fatigue;Patient tolerated treatment well Patient left: in bed;with call bell/phone within reach;with bed alarm set Nurse Communication: Mobility status PT Visit Diagnosis: Muscle weakness (generalized) (M62.81);Adult, failure to thrive (R62.7);Difficulty in walking, not elsewhere classified (R26.2)     Time: 8798-8762 PT Time Calculation (min) (ACUTE ONLY): 36 min  Charges:    $Therapeutic Activity: 8-22 mins PT General Charges $$ ACUTE PT VISIT: 1 Visit                    Darryle George, PTA Acute Rehabilitation Services Secure Chat Preferred  [phone]    Darryle George 05/14/2024, 1:32 PM

## 2024-05-14 NOTE — Progress Notes (Signed)
 Occupational Therapy Treatment Patient Details Name: [name] MRN: [id number] DOB: 06-04-1940 Today's Date: 05/14/2024   History of present illness [name] is a 84 y.o. female who presents emergency department with shortness of breath.  Patient is a poor historian however on arrival was found to be satting 65%.  Was placed on supplemental oxygen  with improvement.  She was endorsing a profound cough and found to be febrile.  She was recently seen by primary care doctor and given Levaquin  for presumed pneumonia; with medical history significant of CHF, hypertension.   OT comments  Pt making slow progress towards acute OT goals. Seen together with PT this session to try to increase OOB activity/mobility. Pt needed multimodal cues to try to facilitate safe rollator management but ultimately needed min A for rollator management (pt pulling up on rollator break handle despite rollator tipping and cues from OT/PT. Struggled with physical and cognitive demands of walking to sink, stood with arms hooked onto sink in trunk flexed position for a few minutes. 3 seated rest breaks throughout session. Pt currently needs +2 for safety/equipment to pivot and mobilize short distances in room. Updated d/c recommendation to SNF.      If plan is discharge home, recommend the following:  A lot of help with bathing/dressing/bathroom;A lot of help with walking and/or transfers;Assist for transportation   Equipment Recommendations   (defer to next venue)    Recommendations for Other Services      Precautions / Restrictions Precautions Precautions: Fall Recall of Precautions/Restrictions: Impaired Precaution/Restrictions Comments: watch O2 sats Restrictions Weight Bearing Restrictions Per Provider Order: No       Mobility Bed Mobility Overal bed mobility: Needs Assistance Bed Mobility: Supine to Sit, Sit to Supine     Supine to sit: Contact guard, HOB elevated, Used rails, Min assist Sit to  supine: Mod assist, HOB elevated, Used rails, Min assist        Transfers Overall transfer level: Needs assistance Equipment used: Rollator (4 wheels) Transfers: Sit to/from Stand Sit to Stand: Mod assist           General transfer comment: EOB elevated, decreased inititation, problem solving and sequencing noted. generalized weakness.     Balance Overall balance assessment: Needs assistance Sitting-balance support: Feet supported, Bilateral upper extremity supported, Single extremity supported Sitting balance-Leahy Scale: Fair Sitting balance - Comments: EOB   Standing balance support: Reliant on assistive device for balance, Bilateral upper extremity supported, During functional activity Standing balance-Leahy Scale: Poor Standing balance comment: reliant on rollator and external support dynamically                           ADL either performed or assessed with clinical judgement   ADL                                              Extremity/Trunk Assessment Upper Extremity Assessment Upper Extremity Assessment: Generalized weakness (B shoulder flexion grossly to about 100* observed reaching for rollator while EOB, bed height raised.)   Lower Extremity Assessment Lower Extremity Assessment: Defer to PT evaluation        Vision       Perception     Praxis     Communication Communication Communication: No apparent difficulties Factors Affecting Communication: Reduced clarity of speech   Cognition Arousal: Alert Behavior During  Therapy: Flat affect Cognition: No family/caregiver present to determine baseline             OT - Cognition Comments: Difficulty sequencing, decreased problem solving, decreased insight into deficits. mild confusion. Slow processing. Decreased initiation                 Following commands: Intact Following commands impaired: Follows one step commands with increased time      Cueing    Cueing Techniques: Verbal cues, Gestural cues  Exercises      Shoulder Instructions       General Comments SpO2 89-90 EOB on 8L. Unable to obtain reliable reading during OOB activity, pt appeared in no acute distress. At end of session pt back on HFNC at 8L SpO2 90.    Pertinent Vitals/ Pain       Pain Assessment Pain Assessment: Faces Faces Pain Scale: Hurts a little bit Pain Location: generalized Pain Descriptors / Indicators: Discomfort Pain Intervention(s): Monitored during session  Home Living                                          Prior Functioning/Environment              Frequency  Min 2X/week        Progress Toward Goals  OT Goals(current goals can now be found in the care plan section)  Progress towards OT goals: Progressing toward goals (slow progress)  Acute Rehab OT Goals Patient Stated Goal: Return home OT Goal Formulation: With patient Time For Goal Achievement: 05/25/24 Potential to Achieve Goals: Fair ADL Goals Pt Will Perform Grooming: with modified independence;standing Pt Will Perform Upper Body Dressing: with set-up;sitting Pt Will Perform Lower Body Dressing: sit to/from stand;with set-up Pt Will Transfer to Toilet: with modified independence;ambulating;regular height toilet  Plan      Co-evaluation    PT/OT/SLP Co-Evaluation/Treatment: Yes Reason for Co-Treatment: To address functional/ADL transfers;For patient/therapist safety;Necessary to address cognition/behavior during functional activity   OT goals addressed during session: ADL's and self-care;Strengthening/ROM;Proper use of Adaptive equipment and DME      AM-PAC OT 6 Clicks Daily Activity     Outcome Measure   Help from another person eating meals?: A Little Help from another person taking care of personal grooming?: A Little Help from another person toileting, which includes using toliet, bedpan, or urinal?: A Lot Help from another person bathing  (including washing, rinsing, drying)?: A Lot Help from another person to put on and taking off regular upper body clothing?: A Lot Help from another person to put on and taking off regular lower body clothing?: A Lot 6 Click Score: 14    End of Session Equipment Utilized During Treatment: Rollator (4 wheels);Oxygen   OT Visit Diagnosis: Unsteadiness on feet (R26.81);Muscle weakness (generalized) (M62.81)   Activity Tolerance Patient limited by fatigue;Patient tolerated treatment well   Patient Left in bed;with call bell/phone within reach;with bed alarm set (pt declined SCDs despite education)   Nurse Communication Mobility status        Time: 8798-8762 OT Time Calculation (min): 36 min  Charges: OT General Charges $OT Visit: 1 Visit OT Treatments $Self Care/Home Management : 8-22 mins  Lamarr Hoots, OT Acute Rehabilitation Services Office: [phone]   Hoots Lamarr DEL 05/14/2024, 1:02 PM

## 2024-05-14 NOTE — Consult Note (Signed)
 "  NAME:  [name], MRN:  [id number], DOB:  1940-10-04, LOS: 6 ADMISSION DATE:  05/08/2024, CONSULTATION DATE:  05/14/24 REFERRING MD:  Lavada Stank, MD CHIEF COMPLAINT:  Hypoxemia   History of Present Illness:  84 year old female with CAD s/p BMS to pLAD, chronic diastolic heart failure, cor pulmonale, ?ILD, HTN, ulcerative colitis, GAD, pancreatic insufficiency, chronic pain, OSA on Trilogy vent on nocturnal 3L who presented on 05/08/24 for shortness of breath and found to be hypoxemic in the ED. PTA treated for pneumonia by PCP with levaquin . RVP negative. Labs pertinent for WBC 12.2 BNP 1100 LA 2.0. CXR with vascular congestion and small right pleural effusion. CT Chest 05/08/24 scattered patchy pulmonary opacities with lower lobe bronchiectasis. During the hospital course she has continued to receive diuresis for volume overload.  She continued to require 6L O2 via [place] and wearing BiPAP on 1/14 but has not worn prior during this hospitalization.  CTA obtained 1/14 was negative for PE but had ground glass opacities R>L, lower lobe bronchiectasis, no edema.  PCCM consulted for recommendations. She is followed by [facility] and on triolgy vent with 3L nightly.  PFT 08/11/21 FVC 1.14 (65%) FEV1 0.87 (65%) Ratio 77   Interpretation: No obstructive defect. Suggestive of restriction   Pertinent  Medical History  As above  Significant Hospital Events: Including procedures, antibiotic start and stop dates in addition to other pertinent events     Interim History / Subjective:  As above  Objective    Blood pressure 139/80, pulse 79, temperature 98.7 F (37.1 C), temperature source Oral, resp. rate 18, weight 88.4 kg, SpO2 91%.    FiO2 (%):  [40 %-44 %] 44 %   Intake/Output Summary (Last 24 hours) at 05/14/2024 9078 Last data filed at 05/13/2024 2335 Gross per 24 hour  Intake 240 ml  Output 1450 ml  Net -1210 ml   Filed Weights   05/13/24 1738  Weight: 88.4 kg   Physical  Exam: General: Chronically-appearing, no acute distress HENT: [place], AT, OP clear, MMM Eyes: EOMI, no scleral icterus Respiratory: Diminished to auscultation on anterior auscultation Cardiovascular: RRR, -M/R/G, no JVD Extremities:-Edema,-tenderness Neuro: AAO x2, CNII-XII grossly intact  Resolved problem list   Assessment and Plan   Acute on chronic hypoxemic respiratory failure - baseline nocturnal use, now on 6L while inpatient despite aggressive diuresis since admission Hx cor pulmonale Asthma Patient reports no oxygen  at home except while wearing her Trilogy at home however family reports daytime use. CT chest reviewed and chronic lung disease including basilar bronchiectasis likely related to post-inflammatory/infectious changes and ground glass opacities throughout that suggest air trapping. Ground glass opacities could represent an interstitial process such as ILD however unclear if her hypoxemia is being driven by an ILD-related exacerbation at this time. Bubble study negative.   Agree with trial of steroids Wean oxygen  for goal >88% Start Breo 200 daily Cardiology planning for [facility] 05/15/24  Pulmonary continue to follow  Labs   CBC: Recent Labs  Lab 05/08/24 2016 05/09/24 0337 05/11/24 0346 05/12/24 0339 05/13/24 0331 05/14/24 0332  WBC 12.2* 13.0* 9.1 8.8 9.3 10.1  NEUTROABS 9.6*  --  7.1 6.5 6.4 7.1  HGB 10.8* 10.2* 9.4* 9.5* 9.7* 10.1*  HCT 33.9* 32.3* 29.6* 29.4* 29.8* 31.5*  MCV 90.6 91.5 91.9 90.2 90.0 89.7  PLT 382 350 376 480* 535* 630*    Basic Metabolic Panel: Recent Labs  Lab 05/10/24 0934 05/11/24 0346 05/12/24 0339 05/13/24 0331 05/14/24 9667  NA 135 135 135 136 136  K 3.7 4.1 3.3* 4.1 3.7  CL 99 100 99 100 98  CO2 24 24 27 28 27   GLUCOSE 221* 219* 238* 144* 150*  BUN 14 17 15 17 21   CREATININE 0.75 0.72 0.66 0.69 0.94  CALCIUM  7.9* 7.9* 8.1* 8.6* 9.0  MG  --  1.7 1.7 1.9 1.7   GFR: Estimated Creatinine Clearance: 46.5 mL/min (by C-G  formula based on SCr of 0.94 mg/dL). Recent Labs  Lab 05/08/24 2107 05/09/24 0025 05/09/24 0337 05/11/24 0346 05/12/24 0339 05/13/24 0331 05/14/24 0332  PROCALCITON  --   --    < > <0.10 <0.10 <0.10 <0.10  WBC  --   --    < > 9.1 8.8 9.3 10.1  LATICACIDVEN 2.0* 1.1  --   --   --   --   --    < > = values in this interval not displayed.    Liver Function Tests: Recent Labs  Lab 05/08/24 2016 05/09/24 0337  AST 22 18  ALT 7 5  ALKPHOS 78 72  BILITOT 0.5 0.4  PROT 6.7 6.2*  ALBUMIN 3.5 3.2*   No results for input(s): LIPASE, AMYLASE in the last 168 hours. No results for input(s): AMMONIA in the last 168 hours.  ABG    Component Value Date/Time   PHART 7.42 05/13/2024 1027   PCO2ART 48 05/13/2024 1027   PO2ART 67 (L) 05/13/2024 1027   HCO3 31.1 (H) 05/13/2024 1027   TCO2 32 08/07/2021 1005   ACIDBASEDEF 1.0 08/04/2021 1144   O2SAT 95.4 05/13/2024 1027     Coagulation Profile: Recent Labs  Lab 05/08/24 2016  INR 1.1    Cardiac Enzymes: No results for input(s): CKTOTAL, CKMB, CKMBINDEX, TROPONINI in the last 168 hours.  HbA1C: Hemoglobin A1C  Date/Time Value Ref Range Status  10/11/2022 10:59 AM 6.8 (A) 4.0 - 5.6 % Final   Hgb A1c MFr Bld  Date/Time Value Ref Range Status  06/16/2021 11:26 AM 7.8 (H) 4.6 - 6.5 % Final    Comment:    Glycemic Control Guidelines for People with Diabetes:Non Diabetic:  <6%Goal of Therapy: <7%Additional Action Suggested:  >8%   07/18/2020 11:43 AM 7.7 (H) 4.6 - 6.5 % Final    Comment:    Glycemic Control Guidelines for People with Diabetes:Non Diabetic:  <6%Goal of Therapy: <7%Additional Action Suggested:  >8%     CBG: Recent Labs  Lab 05/13/24 1133 05/13/24 1601 05/13/24 1722 05/13/24 2111 05/14/24 0752  GLUCAP 194* 251* 278* 243* 132*    Review of Systems:   As above  Past Medical History:  She,  has a past medical history of Adenomatous colon polyp (02/2006), Allergy, Allergy, unspecified not  elsewhere classified, Anemia, Anxiety, Arthritis, Asthma, Blood transfusion without reported diagnosis, Bronchiectasis, CAD (coronary artery disease), Cataract, CHF (congestive heart failure) (HCC), Chronic diastolic CHF (congestive heart failure) (HCC) (11/25/2011), Clotting disorder, Diabetes mellitus, Diverticulosis of colon, DJD (degenerative joint disease), Fibromyalgia, GERD (gastroesophageal reflux disease), H. pylori infection (05/12/2019), Hypercholesterolemia, Hypertension, Microscopic hematuria, Neoplasm of kidney, Obesity, Other diseases of lung, not elsewhere classified, Pinched vertebral nerve, Pulmonary nodule, Stress incontinence, female, Syncope, Ulcerative colitis, left sided (HCC) (2008), UTI (lower urinary tract infection), and Vitamin D  deficiency disease.   Surgical History:   Past Surgical History:  Procedure Laterality Date   ABDOMINAL HYSTERECTOMY     CARDIAC CATHETERIZATION  11/2011   CATARACT EXTRACTION, BILATERAL  2020   CHOLECYSTECTOMY     COLONOSCOPY  01/09/2018   per Dr. Aneita, adenomatous polyps, repeat in 3 yrs   CORONARY STENT PLACEMENT  2002   BMS to the LAD   ESOPHAGOGASTRODUODENOSCOPY  12/2008   HAND SURGERY  01/2006   Right Hand Surgery by Dr. Camella   LEFT HEART CATHETERIZATION WITH CORONARY ANGIOGRAM N/A 12/07/2011   Procedure: LEFT HEART CATHETERIZATION WITH CORONARY ANGIOGRAM;  Surgeon: Peter M Jordan, MD;  Location: [facility] CATH LAB;  Service: Cardiovascular;  Laterality: N/A;   RIGHT/LEFT HEART CATH AND CORONARY ANGIOGRAPHY N/A 08/07/2021   Procedure: RIGHT/LEFT HEART CATH AND CORONARY ANGIOGRAPHY;  Surgeon: Cherrie Toribio SAUNDERS, MD;  Location: [facility] INVASIVE CV LAB;  Service: Cardiovascular;  Laterality: N/A;   UPPER GASTROINTESTINAL ENDOSCOPY       Social History:   reports that she quit smoking about 51 years ago. Her smoking use included cigarettes. She started smoking about 66 years ago. She has a 30 pack-year smoking history. She has been exposed to  tobacco smoke. She has never used smokeless tobacco. She reports that she does not currently use alcohol . She reports that she does not use drugs.   Family History:  Her family history includes Breast cancer in her daughter; Colon polyps in her father; Diabetes in her brother and sister; Heart attack in her father; Hypertension in her sister; Parkinsonism in her mother; Pneumonia in her father; Sarcoidosis in her brother. There is no history of Colon cancer, Esophageal cancer, Rectal cancer, or Stomach cancer.   Allergies Allergies[1]   Home Medications  Prior to Admission medications  Medication Sig Start Date End Date Taking? Authorizing Provider  acetaminophen  (TYLENOL ) 500 MG tablet Take 500 mg by mouth every 6 (six) hours as needed for headache (pain).    Yes [provider]  AIRSUPRA 90-80 MCG/ACT AERO 2 puffs as needed Inhalation prn up to 12 puffs a day; Duration: 30 days 09/30/23  Yes [provider]  albuterol  (PROVENTIL ) (2.5 MG/3ML) 0.083% nebulizer solution Take 3 mLs (2.5 mg total) by nebulization every 4 (four) hours as needed for wheezing or shortness of breath. 02/12/17  Yes [name], MD  albuterol  (VENTOLIN  HFA) 108 (90 BASE) MCG/ACT inhaler Inhale 2 puffs into the lungs every 4 (four) hours as needed for wheezing or shortness of breath. 05/25/14  Yes [name], MD  ALPRAZolam  (XANAX ) 0.5 MG tablet TAKE 1 TABLET BY MOUTH THREE TIMES A DAY AS NEEDED 02/19/24  Yes [name], MD  amLODipine  (NORVASC ) 10 MG tablet Take 1 tablet (10 mg total) by mouth daily. 12/17/23  Yes Parthenia Olivia HERO, PA-C  atorvastatin  (LIPITOR) 20 MG tablet Take 1 tablet (20 mg total) by mouth daily. 12/17/23  Yes Parthenia Olivia HERO, PA-C  azelastine (ASTELIN) 0.1 % nasal spray Place 1 spray into both nostrils at bedtime. 12/08/16  Yes [provider]  budesonide  (ENTOCORT EC ) 3 MG 24 hr capsule Take 3 capsules (9 mg total) by mouth daily. Please take for one more month then  discontinue. 02/27/24  Yes McGreal, Inocente HERO, MD  ciclopirox  (PENLAC ) 8 % solution Apply topically at bedtime. Apply over nail. Apply daily over previous coat. Remove weekly with polish remover. 06/25/23  Yes McCaughan, Dia D, DPM  clopidogrel  (PLAVIX ) 75 MG tablet Take 1 tablet (75 mg total) by mouth daily. 12/17/23  Yes Parthenia Olivia HERO, PA-C  colchicine  0.6 MG tablet Take 0.6 mg by mouth daily. 04/06/23  Yes [provider]  colestipol  (COLESTID ) 1 g tablet TAKE 2 TABLETS BY MOUTH 2  TIMES DAILY. 12/31/23  Yes McGreal, Inocente HERO, MD  dicyclomine  (BENTYL ) 10 MG capsule TAKE 1 CAPSULE (10 MG TOTAL) BY MOUTH 3 (THREE) TIMES DAILY AS NEEDED FOR SPASMS. 03/02/24  Yes McGreal, Inocente HERO, MD  diphenoxylate -atropine  (LOMOTIL ) 2.5-0.025 MG tablet Take 1 tablet by mouth in the morning and at bedtime. 12/19/23  Yes McGreal, Inocente HERO, MD  FARXIGA  10 MG TABS tablet TAKE 1 TABLET BY MOUTH EVERY DAY 11/11/23  Yes [name], MD  fluticasone  (FLONASE ) 50 MCG/ACT nasal spray PLACE 2 SPRAYS IN EACH NOSTRIL AT BEDTIME 07/26/15  Yes [name], MD  gabapentin  (NEURONTIN ) 300 MG capsule TAKE 1 CAPSULE BY MOUTH TWICE A DAY 11/27/23  Yes [name], MD  glimepiride  (AMARYL ) 1 MG tablet TAKE 2 TABLETS (2 MG TOTAL) BY MOUTH DAILY WITH BREAKFAST 12/16/23  Yes [name], MD  guaiFENesin (MUCINEX) 600 MG 12 hr tablet Take 600 mg by mouth 2 (two) times daily as needed for to loosen phlegm or cough.   Yes [provider]  HYDROcodone -acetaminophen  (NORCO) 10-325 MG tablet Take 1 tablet by mouth every 6 (six) hours as needed for moderate pain (pain score 4-6).   Yes [provider]  hydrOXYzine  (ATARAX ) 10 MG tablet Take 10 mg by mouth every 6 (six) hours as needed.   Yes [provider]  isosorbide  mononitrate (IMDUR ) 30 MG 24 hr tablet Take 1 tablet (30 mg total) by mouth daily. 12/17/23 05/09/24 Yes Parthenia Olivia HERO, PA-C  JANUVIA  100 MG tablet TAKE 1 TABLET BY MOUTH EVERY DAY 05/02/23  Yes [name], MD  levocetirizine (XYZAL ) 5 MG tablet Take 5 mg by mouth every evening.   Yes [provider]  levofloxacin  (LEVAQUIN ) 500 MG tablet Take 500 mg by mouth daily. 05/05/24  Yes [provider]  LIDODERM  5 % APPLY 1 PATCH TO SKIN FOR 12 HOURS THEN REMOVE AND DISCARD PATCH WITHIN 72 HOURS OR AS DIRECTED Patient taking differently: Place 1 patch onto the skin daily as needed (pain). 03/10/13  Yes Christi Glendia HERO, MD  loperamide (IMODIUM) 2 MG capsule Take 4-6 mg by mouth daily. Taking as needed only   Yes [provider]  losartan  (COZAAR ) 25 MG tablet Take 1 tablet (25 mg total) by mouth daily. 12/17/23  Yes Parthenia Olivia HERO, PA-C  Magnesium  400 MG TABS Take 3 tablets by mouth daily.   Yes [provider]  mesalamine  (LIALDA ) 1.2 g EC tablet Take 2 tablets (2.4 g total) by mouth 2 (two) times daily. 02/27/24  Yes McGreal, Inocente HERO, MD  metoprolol  succinate (TOPROL -XL) 100 MG 24 hr tablet Take 1 tablet (100 mg total) by mouth daily. TAKE WITH OR IMMEDIATELY FOLLOWING A MEAL. 12/17/23  Yes Parthenia Olivia HERO, PA-C  morphine  (MS CONTIN ) 30 MG 12 hr tablet Take 1 tablet (30 mg total) by mouth every 12 (twelve) hours. 04/15/24  Yes [name], MD  nitroGLYCERIN  (NITROSTAT ) 0.4 MG SL tablet PLACE 1 TABLET UNDER THE TONGUE EVERY 5 MINUTES AS NEEDED FOR CHEST PAIN. 12/27/22  Yes Jeffrie Oneil BROCKS, MD  Olopatadine  HCl 0.7 % SOLN Place 1 drop into both eyes daily as needed (itching/allergies).   Yes [provider]  Pancrelipase , Lip-Prot-Amyl, (ZENPEP ) 20000-63000 units CPEP TAKE 2 CAPSULES (40,000 UNITS) WITH MEALS AND 1 CAPSULE (20,000 UNITS) WITH A SNACK. Max daily dose of 8 capsules (160,000 units). 02/19/24  Yes McGreal, Inocente HERO, MD  pantoprazole  (PROTONIX ) 40 MG tablet Take 1 tablet (  40 mg total) by mouth 2 (two) times daily. 02/19/24  Yes McGreal, Inocente HERO, MD  Polyethyl Glycol-Propyl Glycol (SYSTANE OP) Place 1 drop into both eyes daily. For dry eyes   Yes  [provider]  potassium chloride  SA (KLOR-CON  M) 20 MEQ tablet Take 1 tablet (20 mEq total) by mouth 2 (two) times daily. 12/17/23  Yes Parthenia Olivia HERO, PA-C  promethazine  (PHENERGAN ) 25 MG tablet TAKE 0.5 TABLETS (12.5 MG TOTAL) BY MOUTH DAILY. AS NEEDED FOR NAUSEA/ VOMITING. 02/03/24  Yes McGreal, Inocente HERO, MD  tiZANidine  (ZANAFLEX ) 4 MG tablet Take 4 mg by mouth 2 (two) times daily. 03/17/23  Yes [provider]  torsemide  (DEMADEX ) 20 MG tablet Take 2 tablets (40 mg total) by mouth daily. 12/17/23  Yes Parthenia Olivia HERO, PA-C  Vitamin D , Ergocalciferol , (DRISDOL) 1.25 MG (50000 UNIT) CAPS capsule Take 50,000 Units by mouth every 14 (fourteen) days. 04/24/24  Yes [provider]  Blood Glucose Monitoring Suppl (ONETOUCH VERIO FLEX SYSTEM) w/Device KIT 1 each by Does not apply route daily at 12 noon. 07/14/20   [name], MD  OneTouch Delica Lancets 33G MISC 1 each by Does not apply route daily at 12 noon. 07/14/20   [name], MD  [facility] VERIO test strip USE TO CHECK BLOOD GLUCOSE ONCE DAILY 02/07/24   [name], MD     Critical care time: N/A    MDM: Cora Slater Staff, [name] [facility] Pulmonary/Critical Care Medicine 05/14/2024 9:21 AM   See Amion for personal pager For hours between 7 PM to 7 AM, please call Elink for urgent questions           [1]  Allergies Allergen Reactions   Doxycycline  Other (See Comments)    Unsteady gait   Aspirin  Nausea And Vomiting   Azithromycin  Other (See Comments)    Unknown reaction per family member messed her up   Caffeine Nausea And Vomiting   Ciprofloxacin Nausea And Vomiting    Tolerates levaquin    Codeine Nausea And Vomiting   Oxycodone  Other (See Comments)    Pt stated, upsets my stomach (11/19/17)   Sulfamethizole Other (See Comments)    Unknown reaction   Tramadol Hcl Other (See Comments)    REACTION: pt states spaced-out   "

## 2024-05-14 NOTE — Progress Notes (Signed)
 "                                                                                                                                                                                                                                                                                PROGRESS NOTE     Patient Demographics:    [name], is a 84 y.o. female, DOB - 03/25/1941, FMW:[id number]  Outpatient Primary MD for the patient is [name], MD    LOS - 6  Admit date - 05/08/2024    Chief Complaint  Patient presents with   Shortness of Breath       Brief Narrative (HPI from H&P)    Phebe Dettmer is a 84 y.o. female with medical history significant of CHF, hypertension who presents emergency department with shortness of breath.  Patient is a poor historian however on arrival was found to be satting 65%.  Was placed on supplemental oxygen  with improvement.  She was endorsing a profound cough and found to be febrile.  She was recently seen by primary care doctor and given Levaquin  for presumed pneumonia.  On arrival labs were obtained which showed respiratory viral panel negative, potassium 5.2, glucose 300, WBC 12.2, hemoglobin 10.8, INR 1.1, BNP 1100, lactic acid 2.0.  Patient underwent CT chest which demonstrated pulmonary opacities.  Chest x-ray showed volume overload.  Patient was started on IV antibiotics admitted for further workup.  On evaluation patient had 3+ pitting edema and JVD.    Subjective:   Patient in bed appears to be in no distress denies any headache chest or abdominal pain, does have shortness of breath.   Assessment  & Plan :   Acute hypoxic respiratory failure due to combination of CAP and acute on chronic diastolic CHF EF 60% -   Failed outpatient Levaquin  treatment, antibiotics adjusted, main component appears to be of CHF, placed on IV Lasix  dose increased further on 05/11/2024, echo shows a EF of 60%, clinically improving with diuresis, taper down antibiotics and continue  diuresis with Lasix  IV dose increased Zaroxolyn  added on 05/13/2024.  Advance activity and monitor.  Encouraged to sit in chair use I-S and flutter valve  for pulmonary toiletry. For now continue antibiotics, Lasix , beta-blocker, ARB, Imdur  combination.    Despite aggressive diuresis she is still quite hypoxic with crackles and pulmonary edema on chest x-ray, diuretic dose increased on 05/13/2024, CTA done on 05/13/2024 suggest pulmonary hypertension, no PE but nonspecific lung parenchymal changes, seen by cardiology, right heart cath likely on 05/15/2024, will get pulmonary input as well.  Of note she follows with pulmonary outpatient uses trilogy vent at night and 3 L nasal cannula oxygen  at day, question if she has undiagnosed inflammatory lung disease.  Will challenge with steroids, SLP also to see the patient.   SpO2: 91 % O2 Flow Rate (L/min): 6 L/min FiO2 (%): 44 %  # CAD history of bare metal stent placement in the proximal LAD, with subsequent cardiac catheterizations showing a patent stent, continue Plavix , beta-blocker and statin combination  # Generalized anxiety-continue Xanax    # Hypertension-continue amlodipine , Imdur , losartan , metoprolol    # Pancreatic insufficiency-continue Creon    # Chronic pain-continue gabapentin    # Ulcerative colitis-continue Lialda , budesonide , colestid   # Obesity with OSA.  BMI over 30.  Follow-up with PCP. BiPAP at bedtime.  # H/O Cor Pulmonale, ? ILD versus chronic inflammatory pulmonary disease - on 3lits o2 at home with night time Trilogy Vent - supportive Rx, CT and CTA noted no PE but have pulmonary hypertension on CTA, RV pressures appear stable on echo, lung parenchyma has some nonspecific changes, will get pulmonary input she does follow-up with pulmonary outpatient and uses 3 L nasal cannula oxygen  at home.  May require right heart cath cardiology contemplating.  Continue to monitor.  # Vaginal discharge.  Seen by [facility] physician, stable vaginal  swab, empiric Diflucan  given.  Much improved.      Condition - Guarded  Family Communication  : Called son Elsie [phone] - 05/10/2024 at 8:19 AM, called again on 05/12/2024 at 10:27 AM.  Mailbox full, updated again at 11.10 am., updated 05/13/24, 05/14/2024  Consults.  Cardiology, OB, pulmonary  Code Status : Full code      Procedures.    CTA chest 05/13/2024.   1. No evidence of pulmonary embolus. 2. 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 Recommend correlation with echocardiography and pulmonary hypertension workup, and cardiology/pulmonology consultation; consider follow-up CTA or MRA in 6-12 months to assess stability (sooner if symptoms). 3. Ground-glass opacities within the lungs, more pronounced on the right; differential includes edema, infection/inflammation (including viral/atypical), aspiration, or hemorrhage. Recommend clinical correlation and follow-up chest CT in ~8-12 weeks to document resolution if clinically indicated. 4. Streaky dependent peribronchovascular opacities with bronchiectasis in the lower lobes bilaterally; differential includes chronic aspiration, postinfectious/postinflammatory change, or chronic small airways disease. Recommend correlation for aspiration risk and pulmonary follow-up as clinically indicated. 5. Enlarged heart with moderate calcific coronary artery disease  CT scan chest.  1. Scattered patchy pulmonary opacities, right lower lobe predominant, favoring multifocal atelectasis; superimposed right lower lobe pneumonia is possible but not favored. 2. Cardiomegaly. No interstitial edema. 3. Pulmonary arterial hypertension  Echocardiogram.  1. EF significantly improved since TTE done 04/26/22 . Left ventricular ejection  fraction, by estimation, is 60 to 65%. The left ventricle has normal function. The left ventricle has no regional wall motion abnormalities. Left ventricular diastolic parameters are consistent with Grade I diastolic dysfunction (impaired relaxation). The average left ventricular global longitudinal  strain is -21.6 %. The global longitudinal strain is normal.  2. Right ventricular systolic function is normal. The right ventricular size is normal.  3. Left atrial size was mildly dilated.  4. Right atrial size was mildly dilated.  5. The mitral valve is normal in structure. No evidence of mitral valve regurgitation. No evidence of mitral stenosis.  6. The aortic valve is tricuspid. There is mild calcification of the aortic valve. Aortic valve regurgitation is trivial. Aortic valve sclerosis is present, with no evidence of aortic valve stenosis.  7. The inferior vena cava is normal in size with greater than 50% respiratory variability, suggesting right atrial pressure of 3 mmHg   Disposition Plan  :    Status is: Inpatient   DVT Prophylaxis  :    enoxaparin  (LOVENOX ) injection 40 mg Start: 05/09/24 1000 SCDs Start: 05/08/24 2300   Lab Results  Component Value Date   PLT 630 (H) 05/14/2024    Diet :  Diet Order             Diet Carb Modified Room service appropriate? Yes  Diet effective now                    Inpatient Medications  Scheduled Meds:  amLODipine   10 mg Oral Daily   atorvastatin   20 mg Oral Daily   budesonide   9 mg Oral Daily   Chlorhexidine  Gluconate Cloth  6 each Topical Daily   clopidogrel   75 mg Oral Daily   colchicine   0.6 mg Oral BID   colestipol   2 g Oral BID   doxycycline   100 mg Oral Q12H   enoxaparin  (LOVENOX ) injection  40 mg Subcutaneous Daily   gabapentin   300 mg Oral BID   insulin  aspart  0-5 Units Subcutaneous QHS   insulin  aspart  0-6 Units Subcutaneous TID WC   isosorbide  mononitrate  30 mg Oral Daily   lipase/protease/amylase  24,000 Units  Oral TID with meals   losartan   25 mg Oral Daily   mesalamine   2.4 g Oral BID   metoprolol  succinate  100 mg Oral Daily   morphine   15 mg Oral Q12H   nystatin    Topical BID   oxymetazoline   1 spray Each Nare BID   pantoprazole   40 mg Oral BID   tamsulosin   0.4 mg Oral QPC supper   Continuous Infusions:   PRN Meds:.acetaminophen  **OR** acetaminophen , ALPRAZolam , HYDROcodone  bit-homatropine, ipratropium-albuterol , ondansetron  **OR** ondansetron  (ZOFRAN ) IV, sodium chloride   Antibiotics  :    Anti-infectives (From admission, onward)    Start     Dose/Rate Route Frequency Ordered Stop   05/11/24 1130  doxycycline  (VIBRA -TABS) tablet 100 mg        100 mg Oral Every 12 hours 05/11/24 1034     05/10/24 2330  vancomycin  (VANCOREADY) IVPB 1500 mg/300 mL  Status:  Discontinued       Placed in Followed by Linked Group   1,500 mg 150 mL/hr over 120 Minutes Intravenous Every 48 hours 05/08/24 2316 05/11/24 1034   05/10/24 1200  fluconazole  (DIFLUCAN ) tablet 150 mg        150 mg Oral  Once 05/10/24 1109 05/10/24 1259   05/10/24 1145  fluconazole  (DIFLUCAN ) tablet 100 mg  Status:  Discontinued        100 mg Oral Daily 05/10/24 1047 05/10/24 1215   05/09/24 0600  ceFEPIme  (MAXIPIME ) 2 g in sodium chloride  0.9 % 100 mL IVPB  Status:  Discontinued  2 g 200 mL/hr over 30 Minutes Intravenous Every 8 hours 05/08/24 2316 05/11/24 1034   05/08/24 2330  vancomycin  (VANCOREADY) IVPB 1750 mg/350 mL       Placed in Followed by Linked Group   1,750 mg 175 mL/hr over 120 Minutes Intravenous  Once 05/08/24 2316 05/09/24 0255   05/08/24 2130  doxycycline  (VIBRAMYCIN ) 100 mg in sodium chloride  0.9 % 250 mL IVPB  Status:  Discontinued        100 mg 125 mL/hr over 120 Minutes Intravenous  Once 05/08/24 2119 05/08/24 2137   05/08/24 2000  cefTRIAXone  (ROCEPHIN ) 2 g in sodium chloride  0.9 % 100 mL IVPB        2 g 200 mL/hr over 30 Minutes Intravenous Once 05/08/24 1956 05/08/24 2131   05/08/24 2000   azithromycin  (ZITHROMAX ) 500 mg in sodium chloride  0.9 % 250 mL IVPB  Status:  Discontinued        500 mg 250 mL/hr over 60 Minutes Intravenous  Once 05/08/24 1956 05/08/24 2119         Objective:   Vitals:   05/13/24 2355 05/13/24 2358 05/14/24 0336 05/14/24 0750  BP: 114/73  118/75 139/80  Pulse: 83  79   Resp: (!) 21  18   Temp:   98.7 F (37.1 C) 98.7 F (37.1 C)  TempSrc:   Axillary Oral  SpO2: 92% 91% 91%   Weight:        Wt Readings from Last 3 Encounters:  05/13/24 88.4 kg  02/27/24 85 kg  12/17/23 95.5 kg     Intake/Output Summary (Last 24 hours) at 05/14/2024 0904 Last data filed at 05/13/2024 2335 Gross per 24 hour  Intake 240 ml  Output 1450 ml  Net -1210 ml     Physical Exam  Awake Alert, No new F.N deficits, Normal affect [place].AT,PERRAL Symmetrical Chest wall movement, Good air movement bilaterally, bibasilar crackles RRR,No Gallops,Rubs or new Murmurs,  +ve B.Sounds, Abd Soft, No tenderness,   No Cyanosis, Clubbing or edema      Data Review:    Recent Labs  Lab 05/08/24 2016 05/09/24 0337 05/11/24 0346 05/12/24 0339 05/13/24 0331 05/14/24 0332  WBC 12.2* 13.0* 9.1 8.8 9.3 10.1  HGB 10.8* 10.2* 9.4* 9.5* 9.7* 10.1*  HCT 33.9* 32.3* 29.6* 29.4* 29.8* 31.5*  PLT 382 350 376 480* 535* 630*  MCV 90.6 91.5 91.9 90.2 90.0 89.7  MCH 28.9 28.9 29.2 29.1 29.3 28.8  MCHC 31.9 31.6 31.8 32.3 32.6 32.1  RDW 17.2* 17.2* 16.1* 15.8* 15.7* 15.9*  LYMPHSABS 1.3  --  1.0 1.1 1.7 1.8  MONOABS 1.0  --  0.8 0.8 0.9 0.9  EOSABS 0.1  --  0.2 0.2 0.2 0.2  BASOSABS 0.0  --  0.0 0.0 0.0 0.0    Recent Labs  Lab 05/08/24 2016 05/08/24 2107 05/09/24 0025 05/09/24 0337 05/10/24 0934 05/11/24 0346 05/12/24 0339 05/13/24 0331 05/14/24 0332  NA 135  --   --  138 135 135 135 136 136  K 5.2*  --   --  4.1 3.7 4.1 3.3* 4.1 3.7  CL 101  --   --  104 99 100 99 100 98  CO2 23  --   --  23 24 24 27 28 27   ANIONGAP 11  --   --  11 13 11 10 8 11   GLUCOSE  256*  --   --  244* 221* 219* 238* 144* 150*  BUN 8  --   --  7* 14 17 15 17 21   CREATININE 0.89  --   --  0.74 0.75 0.72 0.66 0.69 0.94  AST 22  --   --  18  --   --   --   --   --   ALT 7  --   --  5  --   --   --   --   --   ALKPHOS 78  --   --  72  --   --   --   --   --   BILITOT 0.5  --   --  0.4  --   --   --   --   --   ALBUMIN 3.5  --   --  3.2*  --   --   --   --   --   CRP  --   --   --   --  8.7* 5.5* 2.9* 1.4* 0.8  PROCALCITON  --   --   --   --  0.10 <0.10 <0.10 <0.10 <0.10  LATICACIDVEN  --  2.0* 1.1  --   --   --   --   --   --   INR 1.1  --   --   --   --   --   --   --   --   MG  --   --   --   --   --  1.7 1.7 1.9 1.7  CALCIUM  8.6*  --   --  8.1* 7.9* 7.9* 8.1* 8.6* 9.0      Recent Labs  Lab 05/08/24 2016 05/08/24 2107 05/09/24 0025 05/09/24 0337 05/10/24 0934 05/11/24 0346 05/12/24 0339 05/13/24 0331 05/14/24 0332  CRP  --   --   --   --  8.7* 5.5* 2.9* 1.4* 0.8  PROCALCITON  --   --   --   --  0.10 <0.10 <0.10 <0.10 <0.10  LATICACIDVEN  --  2.0* 1.1  --   --   --   --   --   --   INR 1.1  --   --   --   --   --   --   --   --   MG  --   --   --   --   --  1.7 1.7 1.9 1.7  CALCIUM  8.6*  --   --    < > 7.9* 7.9* 8.1* 8.6* 9.0   < > = values in this interval not displayed.    --------------------------------------------------------------------------------------------------------------- Lab Results  Component Value Date   CHOL 146 06/16/2021   HDL 58.60 06/16/2021   LDLCALC 66 06/16/2021   TRIG 104.0 06/16/2021   CHOLHDL 2 06/16/2021    Lab Results  Component Value Date   HGBA1C 6.8 (A) 10/11/2022   No results for input(s): TSH, T4TOTAL, FREET4, T3FREE, THYROIDAB in the last 72 hours. No results for input(s): VITAMINB12, FOLATE, FERRITIN, TIBC, IRON , RETICCTPCT in the last 72 hours. ------------------------------------------------------------------------------------------------------------------ Cardiac Enzymes No results  for input(s): CKMB, TROPONINI, MYOGLOBIN in the last 168 hours.  Invalid input(s): CK  Micro Results Recent Results (from the past 240 hours)  Resp panel by RT-PCR (RSV, Flu A&B, Covid) Anterior Nasal Swab     Status: None   Collection Time: 05/08/24  7:56 PM   Specimen: Anterior Nasal Swab  Result Value Ref Range Status   SARS Coronavirus 2 by RT PCR NEGATIVE NEGATIVE Final  Influenza A by PCR NEGATIVE NEGATIVE Final   Influenza B by PCR NEGATIVE NEGATIVE Final    Comment: (NOTE) The Xpert Xpress SARS-CoV-2/FLU/RSV plus assay is intended as an aid in the diagnosis of influenza from Nasopharyngeal swab specimens and should not be used as a sole basis for treatment. Nasal washings and aspirates are unacceptable for Xpert Xpress SARS-CoV-2/FLU/RSV testing.  Fact Sheet for Patients: bloggercourse.com  Fact Sheet for Healthcare Providers: seriousbroker.it  This test is not yet approved or cleared by the [place]  FDA and has been authorized for detection and/or diagnosis of SARS-CoV-2 by FDA under an Emergency Use Authorization (EUA). This EUA will remain in effect (meaning this test can be used) for the duration of the COVID-19 declaration under Section 564(b)(1) of the Act, 21 U.S.C. section 360bbb-3(b)(1), unless the authorization is terminated or revoked.     Resp Syncytial Virus by PCR NEGATIVE NEGATIVE Final    Comment: (NOTE) Fact Sheet for Patients: bloggercourse.com  Fact Sheet for Healthcare Providers: seriousbroker.it  This test is not yet approved or cleared by the [place]  FDA and has been authorized for detection and/or diagnosis of SARS-CoV-2 by FDA under an Emergency Use Authorization (EUA). This EUA will remain in effect (meaning this test can be used) for the duration of the COVID-19 declaration under Section 564(b)(1) of the Act, 21  U.S.C. section 360bbb-3(b)(1), unless the authorization is terminated or revoked.  Performed at [facility], [address], [place], [place] [address]   Blood Culture (routine x 2)     Status: None   Collection Time: 05/08/24  8:16 PM   Specimen: BLOOD RIGHT ARM  Result Value Ref Range Status   Specimen Description BLOOD RIGHT ARM  Final   Special Requests   Final    BOTTLES DRAWN AEROBIC AND ANAEROBIC Blood Culture results may not be optimal due to an inadequate volume of blood received in culture bottles   Culture   Final    NO GROWTH 5 DAYS Performed at [facility], [address] [place], [place] [address]    Report Status 05/13/2024 FINAL  Final  Blood Culture (routine x 2)     Status: None   Collection Time: 05/08/24  8:46 PM   Specimen: BLOOD RIGHT ARM  Result Value Ref Range Status   Specimen Description BLOOD RIGHT ARM  Final   Special Requests   Final    BOTTLES DRAWN AEROBIC AND ANAEROBIC Blood Culture results may not be optimal due to an inadequate volume of blood received in culture bottles   Culture   Final    NO GROWTH 5 DAYS Performed at [facility], [address] [place], [place] [address]    Report Status 05/13/2024 FINAL  Final  Urine Culture     Status: Abnormal   Collection Time: 05/08/24 10:58 PM   Specimen: Urine, Random  Result Value Ref Range Status   Specimen Description URINE, RANDOM  Final   Special Requests   Final    NONE Reflexed from [id number] Performed at [facility], [address] [place], [place] [address]    Culture (A)  Final    20,000 COLONIES/mL MULTIPLE SPECIES PRESENT, SUGGEST RECOLLECTION   Report Status 05/10/2024 FINAL  Final  MRSA Next Gen by PCR, Nasal     Status: None   Collection Time: 05/10/24  5:24 AM   Specimen: Nasal Mucosa; Nasal Swab  Result Value Ref Range Status   MRSA by  PCR Next Gen NOT DETECTED NOT DETECTED Final    Comment: (NOTE) The GeneXpert MRSA Assay (FDA approved for NASAL  specimens only), is one component of a comprehensive MRSA colonization surveillance program. It is not intended to diagnose MRSA infection nor to guide or monitor treatment for MRSA infections. Test performance is not FDA approved in patients less than 51 years old. Performed at [facility], [address], [place], [place] [address]     Radiology Report CT Angio Chest Pulmonary Embolism (PE) W or WO Contrast Result Date: 05/13/2024 EXAM: CTA CHEST 05/13/2024 12:08:00 PM TECHNIQUE: CTA of the chest was performed after the administration of intravenous contrast. Multiplanar reformatted images are provided for review. MIP images are provided for review. Automated exposure control, iterative reconstruction, and/or weight based adjustment of the mA/kV was utilized to reduce the radiation dose to as low as reasonably achievable. 75 mL of iohexol  (OMNIPAQUE ) 350 MG/ML injection was administered. COMPARISON: CT of the chest dated 05/08/2024. CLINICAL HISTORY: Pulmonary embolism (PE) suspected, low to intermediate prob, neg D-dimer. Pulmonary embolism is suspected with low to intermediate probability. D-dimer test is negative. FINDINGS: PULMONARY ARTERIES: Pulmonary arteries are adequately opacified for evaluation. No acute pulmonary embolus. 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 MEDIASTINUM: The heart is enlarged and there is moderate calcific coronary artery disease present. The thoracic aorta is normal in caliber, but demonstrates moderate calcific atheromatous disease. The descending thoracic aorta is mildly tortuous. The pericardium demonstrates no acute abnormality. LYMPH NODES: No mediastinal, hilar or axillary lymphadenopathy. LUNGS AND PLEURA: There are ground-glass opacities within the lungs, more pronounced on the right. There are also streaky peribronchovascular opacities with bronchiectasis present dependently  within the lower lobes bilaterally. No focal consolidation or pulmonary edema. No evidence of pleural effusion or pneumothorax. Based on these findings, the most likely differential diagnoses include chronic interstitial lung disease (e.g., non-specific interstitial pneumonia, usual interstitial pneumonia, hypersensitivity pneumonitis) or chronic infection (e.g., atypical mycobacteria). Appropriate follow-up guidelines include pulmonary function tests (PFTs), high-resolution CT (HRCT) of the chest for further characterization of interstitial lung disease, and consideration of consultation with a pulmonologist. UPPER ABDOMEN: Limited images of the upper abdomen are unremarkable. SOFT TISSUES AND BONES: No acute bone or soft tissue abnormality. IMPRESSION: 1. No evidence of pulmonary embolus. 2. 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 Recommend correlation with echocardiography and pulmonary hypertension workup, and cardiology/pulmonology consultation; consider follow-up CTA or MRA in 6-12 months to assess stability (sooner if symptoms). 3. Ground-glass opacities within the lungs, more pronounced on the right; differential includes edema, infection/inflammation (including viral/atypical), aspiration, or hemorrhage. Recommend clinical correlation and follow-up chest CT in ~8-12 weeks to document resolution if clinically indicated. 4. Streaky dependent peribronchovascular opacities with bronchiectasis in the lower lobes bilaterally; differential includes chronic aspiration, postinfectious/postinflammatory change, or chronic small airways disease. Recommend correlation for aspiration risk and pulmonary follow-up as clinically indicated. 5. Enlarged heart with moderate calcific coronary  artery disease. Electronically signed by: Evalene Coho MD 05/13/2024 12:53 PM EST RP Workstation: [id number]   DG Chest Port 1 View Result Date: 05/13/2024 EXAM: 1 VIEW(S) XRAY OF THE CHEST 05/13/2024 07:29:00 AM COMPARISON: 05/10/2024 CLINICAL HISTORY: The patient is experiencing shortness of breath. ICD10: [id number] - Shortness of breath. FINDINGS: LUNGS AND PLEURA: Mild pulmonary  edema, slightly improved. Bibasilar opacities, slightly improved. Fluid in right minor fissure. No pneumothorax. HEART AND MEDIASTINUM: Heart size at upper limits of normal. Aortic atherosclerosis. Enlarged central pulmonary arteries. BONES AND SOFT TISSUES: No acute osseous abnormality. IMPRESSION: 1. Mild pulmonary edema, slightly improved. 2. Bibasilar opacities, slightly improved. 3. Enlarged central pulmonary arteries. Electronically signed by: Waddell Calk MD 05/13/2024 07:38 AM EST RP Workstation: [id number]      Signature  -   Lavada Stank DO on 05/14/2024 at 9:04 AM   -  To page go to [email or web address]   "

## 2024-05-14 NOTE — Plan of Care (Signed)

## 2024-05-14 NOTE — Progress Notes (Signed)
 "  Cardiologist:  Skains/Bensimohn  Subjective:  No complaints still with cough/dyspnea when moving  Objective:  Vitals:   05/13/24 2346 05/13/24 2355 05/13/24 2358 05/14/24 0336  BP:  114/73  118/75  Pulse:  83  79  Resp:  (!) 21  18  Temp:    98.7 F (37.1 C)  TempSrc:    Axillary  SpO2: 95% 92% 91% 91%  Weight:        Intake/Output from previous day:  Intake/Output Summary (Last 24 hours) at 05/14/2024 0705 Last data filed at 05/13/2024 2335 Gross per 24 hour  Intake 240 ml  Output 1450 ml  Net -1210 ml    Physical Exam:  Obese black female Slow verbalization  JVP hard to assess No significant murmur Abdomen benign Pockets of egophony and rhonchi with wheezing on lung exam Plus one edema   Lab Results: Basic Metabolic Panel: Recent Labs    05/13/24 0331 05/14/24 0332  NA 136 136  K 4.1 3.7  CL 100 98  CO2 28 27  GLUCOSE 144* 150*  BUN 17 21  CREATININE 0.69 0.94  CALCIUM  8.6* 9.0  MG 1.9 1.7   Liver Function Tests: No results for input(s): AST, ALT, ALKPHOS, BILITOT, PROT, ALBUMIN in the last 72 hours. No results for input(s): LIPASE, AMYLASE in the last 72 hours. CBC: Recent Labs    05/13/24 0331 05/14/24 0332  WBC 9.3 10.1  NEUTROABS 6.4 7.1  HGB 9.7* 10.1*  HCT 29.8* 31.5*  MCV 90.0 89.7  PLT 535* 630*    Imaging: CT Angio Chest Pulmonary Embolism (PE) W or WO Contrast Result Date: 05/13/2024 EXAM: CTA CHEST 05/13/2024 12:08:00 PM TECHNIQUE: CTA of the chest was performed after the administration of intravenous contrast. Multiplanar reformatted images are provided for review. MIP images are provided for review. Automated exposure control, iterative reconstruction, and/or weight based adjustment of the mA/kV was utilized to reduce the radiation dose to as low as reasonably achievable. 75 mL of iohexol  (OMNIPAQUE ) 350 MG/ML injection was administered. COMPARISON: CT of the chest dated 05/08/2024. CLINICAL HISTORY: Pulmonary  embolism (PE) suspected, low to intermediate prob, neg D-dimer. Pulmonary embolism is suspected with low to intermediate probability. D-dimer test is negative. FINDINGS: PULMONARY ARTERIES: Pulmonary arteries are adequately opacified for evaluation. No acute pulmonary embolus. 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 MEDIASTINUM: The heart is enlarged and there is moderate calcific coronary artery disease present. The thoracic aorta is normal in caliber, but demonstrates moderate calcific atheromatous disease. The descending thoracic aorta is mildly tortuous. The pericardium demonstrates no acute abnormality. LYMPH NODES: No mediastinal, hilar or axillary lymphadenopathy. LUNGS AND PLEURA: There are ground-glass opacities within the lungs, more pronounced on the right. There are also streaky peribronchovascular opacities with bronchiectasis present dependently within the lower lobes bilaterally. No focal consolidation or pulmonary edema. No evidence of pleural effusion or pneumothorax. Based on these findings, the most likely differential diagnoses include chronic interstitial lung disease (e.g., non-specific interstitial pneumonia, usual interstitial pneumonia, hypersensitivity pneumonitis) or chronic infection (e.g., atypical mycobacteria). Appropriate follow-up guidelines include pulmonary function tests (PFTs), high-resolution CT (HRCT) of the chest for further characterization of interstitial lung disease, and consideration of consultation with a pulmonologist. UPPER ABDOMEN: Limited images of the upper abdomen are unremarkable. SOFT TISSUES AND BONES: No acute bone or soft tissue abnormality. IMPRESSION: 1. No evidence of pulmonary embolus. 2. Aneurysmal dilatation of the main pulmonary artery measuring up to  approximately 5.5 cm; most commonly associated with pulmonary arterial hypertension (primary or secondary), congenital  left-to-right shunt, and less commonly vasculitis/connective tissue disease or idiopathic. Recommend correlation with echocardiography and pulmonary hypertension workup, and cardiology/pulmonology consultation; consider follow-up CTA or MRA in 6-12 months to assess stability (sooner if symptoms). 3. Ground-glass opacities within the lungs, more pronounced on the right; differential includes edema, infection/inflammation (including viral/atypical), aspiration, or hemorrhage. Recommend clinical correlation and follow-up chest CT in ~8-12 weeks to document resolution if clinically indicated. 4. Streaky dependent peribronchovascular opacities with bronchiectasis in the lower lobes bilaterally; differential includes chronic aspiration, postinfectious/postinflammatory change, or chronic small airways disease. Recommend correlation for aspiration risk and pulmonary follow-up as clinically indicated. 5. Enlarged heart with moderate calcific coronary artery disease. Electronically signed by: Evalene Coho MD 05/13/2024 12:53 PM EST RP Workstation: [id number]   DG Chest Port 1 View Result Date: 05/13/2024 EXAM: 1 VIEW(S) XRAY OF THE CHEST 05/13/2024 07:29:00 AM COMPARISON: 05/10/2024 CLINICAL HISTORY: The patient is experiencing shortness of breath. ICD10: 858119 - Shortness of breath. FINDINGS: LUNGS AND PLEURA: Mild pulmonary edema, slightly improved. Bibasilar opacities, slightly improved. Fluid in right minor fissure. No pneumothorax. HEART AND MEDIASTINUM: Heart size at upper limits of normal. Aortic atherosclerosis. Enlarged central pulmonary arteries. BONES AND SOFT TISSUES: No acute osseous abnormality. IMPRESSION: 1. Mild pulmonary edema, slightly improved. 2. Bibasilar opacities, slightly improved. 3. Enlarged central pulmonary arteries. Electronically signed by: Waddell Calk MD 05/13/2024 07:38 AM EST RP Workstation: [id number]    Cardiac Studies:  ECG: SR low voltage rate 92   Telemetry:  NSR   Echo:  Echocardiogram:  05/09/24     1. EF significantly improved since TTE done 04/26/22 . Left ventricular  ejection fraction, by estimation, is 60 to 65%. The left ventricle has  normal function. The left ventricle has no regional wall motion  abnormalities. Left ventricular diastolic  parameters are consistent with Grade I diastolic dysfunction (impaired  relaxation). The average left ventricular global longitudinal strain is  -21.6 %. The global longitudinal strain is normal.   2. Right ventricular systolic function is normal. The right ventricular  size is normal.   3. Left atrial size was mildly dilated.   4. Right atrial size was mildly dilated.   5. The mitral valve is normal in structure. No evidence of mitral valve  regurgitation. No evidence of mitral stenosis.   6. The aortic valve is tricuspid. There is mild calcification of the  aortic valve. Aortic valve regurgitation is trivial. Aortic valve  sclerosis is present, with no evidence of aortic valve stenosis.   7. The inferior vena cava is normal in size with greater than 50%  respiratory variability, suggesting right atrial pressure of 3 mmHg.   Heart Catheterization 08/07/2021    Conclusion       Dist LAD lesion is 90% stenosed.   Prox RCA lesion is 30% stenosed.   Dist RCA lesion is 30% stenosed.   Prox LAD to Mid LAD lesion is 30% stenosed.   The left ventricular ejection fraction is 55-65% by visual estimate.   Findings:   Ao = 167/84 (118) LV = 152/11 RA =  8 RV = 64/11 PA = 68/26 (45) PCW = 15 Fick cardiac output/index = 6.0/3.0 PVR = 5.0 WU FA sat = 92% PA sat = 66%, 67%   Assessment: 1. Predominantly mild to moderate non-obstructive CAD with 90% lesion in apical LAD 2. Normal LV function 3. Moderate PAH   Plan/Discussion:    Medical  therapy for CAD. Likely Group 3 PAH. Will need evaluation for OSA/OHS  Medications:    amLODipine   10 mg Oral Daily   atorvastatin   20 mg Oral Daily   budesonide   9  mg Oral Daily   Chlorhexidine  Gluconate Cloth  6 each Topical Daily   clopidogrel   75 mg Oral Daily   colchicine   0.6 mg Oral BID   colestipol   2 g Oral BID   doxycycline   100 mg Oral Q12H   enoxaparin  (LOVENOX ) injection  40 mg Subcutaneous Daily   gabapentin   300 mg Oral BID   insulin  aspart  0-5 Units Subcutaneous QHS   insulin  aspart  0-6 Units Subcutaneous TID WC   isosorbide  mononitrate  30 mg Oral Daily   lipase/protease/amylase  24,000 Units Oral TID with meals   losartan   25 mg Oral Daily   mesalamine   2.4 g Oral BID   metoprolol  succinate  100 mg Oral Daily   morphine   15 mg Oral Q12H   nystatin    Topical BID   oxymetazoline   1 spray Each Nare BID   pantoprazole   40 mg Oral BID   tamsulosin   0.4 mg Oral QPC supper      Assessment/Plan:  Hypoxia:  there may be a component of diastolic dysfunction as evidenced by initially elevated BNP 1166 that has improved with good diuresis to 258. However she still has hypoxia despite good diuresis. She has known WHO Class 3 PHT by cath 08/07/21 moderate  PA 68/26 mmHg with PAd significantly higher than PCW of 15 mmHg At that time PVR was 5.0 WU's. Her CT is pretty abnormal with interstitial lung changes and bronchiectasis with no effusion or current fluid. Her main PA is severely dilated over 5 cm again suggesting chronic PHT. There is no ASD/VSD on echo CTA shows normal PV drainage. I doubt there is a significant cardiac shunt. Will order echo with bubble study supine and sitting up to assess for platypnea orthodeoxia. I tried to call her son [name] but went to voicemail that was full. She lives with him She is willing to have repeat right heart cath. She needs to get a pulmonary consultation Sees Dr Neda Suspect we will try to get right heart cath done Friday  CAD:  cath 2023 with only distal LAD stenosis 90% and echo with normal LV function not playing a role    [name] 05/14/2024, 7:05 AM    "

## 2024-05-15 ENCOUNTER — Telehealth (HOSPITAL_COMMUNITY): Payer: Self-pay

## 2024-05-15 ENCOUNTER — Other Ambulatory Visit (HOSPITAL_COMMUNITY): Payer: Self-pay

## 2024-05-15 ENCOUNTER — Encounter (HOSPITAL_COMMUNITY)
Admission: EM | Disposition: A | Discharge status: Home Health | Payer: Self-pay | Source: Home / Self Care | Attending: Internal Medicine

## 2024-05-15 DIAGNOSIS — I2510 Atherosclerotic heart disease of native coronary artery without angina pectoris: Secondary | ICD-10-CM | POA: Diagnosis not present

## 2024-05-15 DIAGNOSIS — I272 Pulmonary hypertension, unspecified: Secondary | ICD-10-CM | POA: Diagnosis not present

## 2024-05-15 DIAGNOSIS — J159 Unspecified bacterial pneumonia: Secondary | ICD-10-CM | POA: Diagnosis not present

## 2024-05-15 HISTORY — PX: RIGHT HEART CATH: CATH118263

## 2024-05-15 LAB — BASIC METABOLIC PANEL WITH GFR
Anion gap: 11 (ref 5–15)
BUN: 39 mg/dL — ABNORMAL HIGH (ref 8–23)
CO2: 25 mmol/L (ref 22–32)
Calcium: 8.7 mg/dL — ABNORMAL LOW (ref 8.9–10.3)
Chloride: 97 mmol/L — ABNORMAL LOW (ref 98–111)
Creatinine, Ser: 1.07 mg/dL — ABNORMAL HIGH (ref 0.44–1.00)
GFR, Estimated: 52 mL/min — ABNORMAL LOW
Glucose, Bld: 259 mg/dL — ABNORMAL HIGH (ref 70–99)
Potassium: 3.7 mmol/L (ref 3.5–5.1)
Sodium: 132 mmol/L — ABNORMAL LOW (ref 135–145)

## 2024-05-15 LAB — CBC WITH DIFFERENTIAL/PLATELET
Abs Immature Granulocytes: 0.13 K/uL — ABNORMAL HIGH (ref 0.00–0.07)
Basophils Absolute: 0 K/uL (ref 0.0–0.1)
Basophils Relative: 0 %
Eosinophils Absolute: 0 K/uL (ref 0.0–0.5)
Eosinophils Relative: 0 %
HCT: 30.1 % — ABNORMAL LOW (ref 36.0–46.0)
Hemoglobin: 9.8 g/dL — ABNORMAL LOW (ref 12.0–15.0)
Immature Granulocytes: 1 %
Lymphocytes Relative: 10 %
Lymphs Abs: 1.1 K/uL (ref 0.7–4.0)
MCH: 28.9 pg (ref 26.0–34.0)
MCHC: 32.6 g/dL (ref 30.0–36.0)
MCV: 88.8 fL (ref 80.0–100.0)
Monocytes Absolute: 0.5 K/uL (ref 0.1–1.0)
Monocytes Relative: 5 %
Neutro Abs: 9.4 K/uL — ABNORMAL HIGH (ref 1.7–7.7)
Neutrophils Relative %: 84 %
Platelets: 628 K/uL — ABNORMAL HIGH (ref 150–400)
RBC: 3.39 MIL/uL — ABNORMAL LOW (ref 3.87–5.11)
RDW: 15.4 % (ref 11.5–15.5)
WBC: 11.1 K/uL — ABNORMAL HIGH (ref 4.0–10.5)
nRBC: 0 % (ref 0.0–0.2)

## 2024-05-15 LAB — GLUCOSE, CAPILLARY
Glucose-Capillary: 232 mg/dL — ABNORMAL HIGH (ref 70–99)
Glucose-Capillary: 225 mg/dL — ABNORMAL HIGH (ref 70–99)
Glucose-Capillary: 181 mg/dL — ABNORMAL HIGH (ref 70–99)
Glucose-Capillary: 293 mg/dL — ABNORMAL HIGH (ref 70–99)
Glucose-Capillary: 297 mg/dL — ABNORMAL HIGH (ref 70–99)

## 2024-05-15 LAB — POCT I-STAT EG7
Acid-Base Excess: 1 mmol/L (ref 0.0–2.0)
Bicarbonate: 26.9 mmol/L (ref 20.0–28.0)
Calcium, Ion: 1.16 mmol/L (ref 1.15–1.40)
HCT: 32 % — ABNORMAL LOW (ref 36.0–46.0)
Hemoglobin: 10.9 g/dL — ABNORMAL LOW (ref 12.0–15.0)
O2 Saturation: 70 %
Potassium: 3.4 mmol/L — ABNORMAL LOW (ref 3.5–5.1)
Sodium: 138 mmol/L (ref 135–145)
TCO2: 28 mmol/L (ref 22–32)
pCO2, Ven: 45.3 mmHg (ref 44–60)
pH, Ven: 7.381 (ref 7.25–7.43)
pO2, Ven: 37 mmHg (ref 32–45)

## 2024-05-15 LAB — PROCALCITONIN: Procalcitonin: <0.1 ng/mL

## 2024-05-15 MED ORDER — ENOXAPARIN SODIUM 40 MG/0.4ML IJ SOSY: 40.0000 mg | PREFILLED_SYRINGE | INTRAMUSCULAR | Status: DC

## 2024-05-15 MED ORDER — ONDANSETRON HCL 4 MG/2ML IJ SOLN: 4.0000 mg | Freq: Four times a day (QID) | INTRAMUSCULAR | Status: DC | PRN

## 2024-05-15 MED ORDER — SODIUM CHLORIDE 0.9% FLUSH
3.0000 mL | Freq: Two times a day (BID) | INTRAVENOUS | Status: DC
  Administered 2024-05-15 – 2024-05-17 (×5): 3 mL via INTRAVENOUS

## 2024-05-15 MED ORDER — SODIUM CHLORIDE 0.9 % IV SOLN: 250.0000 mL | INTRAVENOUS | Status: AC | PRN

## 2024-05-15 MED ORDER — LIDOCAINE HCL (PF) 1 % IJ SOLN
INTRAMUSCULAR | Status: DC | PRN
  Administered 2024-05-15: 2 mL

## 2024-05-15 MED ORDER — ACETAMINOPHEN 325 MG PO TABS: 650.0000 mg | ORAL_TABLET | ORAL | Status: DC | PRN

## 2024-05-15 MED ORDER — LABETALOL HCL 5 MG/ML IV SOLN: 10.0000 mg | INTRAVENOUS | Status: AC | PRN

## 2024-05-15 MED ORDER — FREE WATER
250.0000 mL | Freq: Once | Status: AC
  Administered 2024-05-15: 250 mL via ORAL

## 2024-05-15 MED ORDER — HYDRALAZINE HCL 20 MG/ML IJ SOLN: 10.0000 mg | INTRAMUSCULAR | Status: AC | PRN

## 2024-05-15 MED ORDER — HEPARIN (PORCINE) IN NACL 1000-0.9 UT/500ML-% IV SOLN
INTRAVENOUS | Status: DC | PRN
  Administered 2024-05-15: 500 mL

## 2024-05-15 MED ORDER — LIDOCAINE HCL (PF) 1 % IJ SOLN
INTRAMUSCULAR | Status: AC
  Filled 2024-05-15: qty 30

## 2024-05-15 MED ORDER — SODIUM CHLORIDE 0.9% FLUSH
3.0000 mL | INTRAVENOUS | Status: DC | PRN
  Administered 2024-05-18: 3 mL via INTRAVENOUS

## 2024-05-15 NOTE — Evaluation (Signed)
 Clinical/Bedside Swallow Evaluation Patient Details  Name: [name] MRN: [id number] Date of Birth: February 04, 1941  Today's Date: 05/15/2024 Time: SLP Start Time (ACUTE ONLY): 1355 SLP Stop Time (ACUTE ONLY): 1415 SLP Time Calculation (min) (ACUTE ONLY): 20 min  Past Medical History:  Past Medical History:  Diagnosis Date   Adenomatous colon polyp 02/2006   Allergy    Allergy, unspecified not elsewhere classified    Anemia    Anxiety    Arthritis    Asthma    Blood transfusion without reported diagnosis    Bronchiectasis    CAD (coronary artery disease)    BMS to the LAD, 2002; cath 12/07/11 patent LAD stent and mild nonobstructive disease, EF 65%; Medical management   Cataract    removed both eyes   CHF (congestive heart failure) (HCC)    Diastolic   Chronic diastolic CHF (congestive heart failure) (HCC) 11/25/2011   Clotting disorder    in legs per pt    Diabetes mellitus    Diverticulosis of colon    DJD (degenerative joint disease)    Fibromyalgia    GERD (gastroesophageal reflux disease)    H. pylori infection 05/12/2019   Hypercholesterolemia    Hypertension    Microscopic hematuria    Neoplasm of kidney    Obesity    Other diseases of lung, not elsewhere classified    Pinched vertebral nerve    Pulmonary nodule    Negative PET in 2010   Stress incontinence, female    Syncope    Ulcerative colitis, left sided (HCC) 2008   Hx of   UTI (lower urinary tract infection)    Vitamin D  deficiency disease    Past Surgical History:  Past Surgical History:  Procedure Laterality Date   ABDOMINAL HYSTERECTOMY     CARDIAC CATHETERIZATION  11/2011   CATARACT EXTRACTION, BILATERAL  2020   CHOLECYSTECTOMY     COLONOSCOPY  01/09/2018   per Dr. Aneita, adenomatous polyps, repeat in 3 yrs   CORONARY STENT PLACEMENT  2002   BMS to the LAD   ESOPHAGOGASTRODUODENOSCOPY  12/2008   HAND SURGERY  01/2006   Right Hand Surgery by Dr. Camella   LEFT HEART CATHETERIZATION WITH  CORONARY ANGIOGRAM N/A 12/07/2011   Procedure: LEFT HEART CATHETERIZATION WITH CORONARY ANGIOGRAM;  Surgeon: Peter M Jordan, MD;  Location: [facility] CATH LAB;  Service: Cardiovascular;  Laterality: N/A;   RIGHT/LEFT HEART CATH AND CORONARY ANGIOGRAPHY N/A 08/07/2021   Procedure: RIGHT/LEFT HEART CATH AND CORONARY ANGIOGRAPHY;  Surgeon: Cherrie Toribio SAUNDERS, MD;  Location: [facility] INVASIVE CV LAB;  Service: Cardiovascular;  Laterality: N/A;   UPPER GASTROINTESTINAL ENDOSCOPY     HPI:  84yo female admitted 05/08/24 with SOB, cough, sepsis. CXR: slightly improved pulmonary edema abd BLL opacities. No prior ST intervention. PMH: CHF, HTN, anxiety, DM    Assessment / Plan / Recommendation  Clinical Impression  Pt presents with functional oropharyngeal swallow, as best can be determined at bedside without instrumental testing. Pt presents with missing dentition, but reports tolerance of regular solids and thin liquids at home. CN exam unremarkable. Pt accepted trials of thin liquid, puree, and solid textures. Yale swallow (3oz water ) screen passed without overt s/s aspiration, indicating low likelihood of aspiration. Recommend resume regular diet/thin liquids, meds as tolerated. ST signing off at this time. Please reconsult if needs arise.  SLP Visit Diagnosis: Dysphagia, unspecified (R13.10)    Aspiration Risk  Mild aspiration risk    Diet Recommendation Regular;Thin liquid  Liquid Administration via: Straw;Cup Medication Administration: Other (Comment) (as tolerated) Supervision: Patient able to self feed Compensations: Minimize environmental distractions;Slow rate;Small sips/bites Postural Changes: Seated upright at 90 degrees    Other Recommendations Oral Care Recommendations: Oral care BID     Swallow Evaluation Recommendations  Resume reg/thin, meds as tolerated   Functional Status Assessment Patient has not had a recent decline in their functional status      Prognosis Prognosis for improved  oropharyngeal function: Good      Swallow Study   General Date of Onset: 05/08/24 HPI: 84yo female admitted 05/08/24 with SOB, cough, sepsis. CXR: slightly improved pulmonary edema abd BLL opacities. No prior ST intervention. PMH: CHF, HTN, anxiety, DM Type of Study: Bedside Swallow Evaluation Previous Swallow Assessment: none Diet Prior to this Study: Clear liquid diet Temperature Spikes Noted: No Respiratory Status: Nasal cannula History of Recent Intubation: No Behavior/Cognition: Alert;Cooperative Oral Cavity Assessment: Within Functional Limits Oral Care Completed by SLP: Yes Oral Cavity - Dentition: Missing dentition Self-Feeding Abilities: Able to feed self Patient Positioning: Upright in bed Baseline Vocal Quality: Normal Volitional Cough: Weak Volitional Swallow: Able to elicit    Oral/Motor/Sensory Function  WFL  Ice Chips Ice chips: Not tested   Thin Liquid Thin Liquid: Within functional limits Presentation: Straw    Nectar Thick Nectar Thick Liquid: Not tested   Honey Thick Honey Thick Liquid: Not tested   Puree Puree: Within functional limits Presentation: Spoon   Solid     Solid: Within functional limits Presentation: Self Fed     Dolton Shaker B. Dory, MSP, CCC-SLP Speech Language Pathologist Office: [phone]  Dory Caprice Daring 05/15/2024,2:24 PM

## 2024-05-15 NOTE — Inpatient Diabetes Management (Addendum)
 Inpatient Diabetes Program Recommendations  AACE/ADA: New Consensus Statement on Inpatient Glycemic Control (2015)  Target Ranges:  Prepandial:   less than 140 mg/dL      Peak postprandial:   less than 180 mg/dL (1-2 hours)      Critically ill patients:  140 - 180 mg/dL   Lab Results  Component Value Date   GLUCAP 225 (H) 05/15/2024   HGBA1C 11.5 (H) 05/14/2024    Review of Glycemic Control  Latest Reference Range & Units 05/14/24 15:53 05/14/24 21:31 05/15/24 05:51 05/15/24 07:49  Glucose-Capillary 70 - 99 mg/dL 723 (H) 662 (H) 767 (H) 225 (H)  (H): Data is abnormally high Diabetes history: Type 2 DM Outpatient Diabetes medications: Januvia  100 mg every day, Farxiga  10 mg every day, Amaryl  2 mg QD Current orders for Inpatient glycemic control: Novolog  0-15 units TID & HS Solumedrol 60 mg every day, Budesonide  9 mg QD  Inpatient Diabetes Program Recommendations:    Consider adding Lantus  12 units every day.  Attempted to speak with patient, not appropriate at this time given post procedure.   Addendum: Spoke with patient and son regarding outpatient diabetes management.  Reviewed patient's current A1c of 11.5%. Explained what a A1c is and what it measures. Also reviewed goal A1c with patient, importance of good glucose control @ home, and blood sugar goals. Reviewed patho of DM, role of pancreas, role of insulin , differences between long acting and short acting insulin , CGM, benefits, when to call MD, importance of protein, importance of mindfulness of CHO intake, recommended frequency for checking CBGs, current glucose trends, impact of steroids, vascular changes and commorbidities.  Patient is not interested in CGM.  Educated patient and son on insulin  pen use at home. Reviewed contents of insulin  flexpen starter kit. Reviewed all steps of insulin  pen including attachment of needle, 2-unit air shot, dialing up dose, giving injection, removing needle, disposal of sharps, storage of  unused insulin , disposal of insulin  etc. Patient's  able to provide successful return demonstration. Also reviewed troubleshooting with insulin  pen. MD to give patient Rxs for insulin  pens and insulin  pen needles.   Thanks, Tinnie Minus, MSN, RNC-OB Diabetes Coordinator [phone] (8a-5p)

## 2024-05-15 NOTE — Plan of Care (Signed)

## 2024-05-15 NOTE — Progress Notes (Signed)
 "                                                                                                                                                                                                                                                                                PROGRESS NOTE     Patient Demographics:    [name], is a 84 y.o. female, DOB - 1941/03/26, FMW:[id number]  Outpatient Primary MD for the patient is [name], MD    LOS - 7  Admit date - 05/08/2024    Chief Complaint  Patient presents with   Shortness of Breath       Brief Narrative (HPI from H&P)    [name] is a 84 y.o. female with medical history significant of CHF, hypertension who presents emergency department with shortness of breath.  Patient is a poor historian however on arrival was found to be satting 65%.  Was placed on supplemental oxygen  with improvement.  She was endorsing a profound cough and found to be febrile.  She was recently seen by primary care doctor and given Levaquin  for presumed pneumonia.  On arrival labs were obtained which showed respiratory viral panel negative, potassium 5.2, glucose 300, WBC 12.2, hemoglobin 10.8, INR 1.1, BNP 1100, lactic acid 2.0.  Patient underwent CT chest which demonstrated pulmonary opacities.  Chest x-ray showed volume overload.  Patient was started on IV antibiotics admitted for further workup.  On evaluation patient had 3+ pitting edema and JVD.    Subjective:   Patient in bed, appears comfortable, denies any headache, no fever, no chest pain or pressure, improved shortness of breath , no abdominal pain. No focal weakness.   Assessment  & Plan :   Acute hypoxic respiratory failure due to combination of CAP and acute on chronic diastolic CHF EF 60% -   Failed outpatient Levaquin  treatment, antibiotics adjusted, main component appears to be of CHF, placed on IV Lasix  dose increased further on 05/11/2024, echo shows a EF of 60%, clinically improving with  diuresis, taper down antibiotics and continue diuresis with Lasix  IV dose increased Zaroxolyn  added on 05/13/2024.  Advance activity and monitor.  Encouraged to sit in chair  use I-S and flutter valve for pulmonary toiletry. For now continue antibiotics, Lasix , beta-blocker, ARB, Imdur  combination.    Despite aggressive diuresis she is still quite hypoxic with crackles and pulmonary edema on chest x-ray, diuretic dose increased on 05/13/2024, CTA done on 05/13/2024 suggest pulmonary hypertension, no PE but nonspecific lung parenchymal changes, seen by cardiology, right heart cath likely on 05/15/2024.  Both cardiology pulmonary following, based on nonspecific CT findings question if some underlying chronic lung pathology, for now right heart cath on 05/15/2024, steroid challenge, will appreciate guidance by pulmonary and cardiology.     SpO2: 93 % O2 Flow Rate (L/min): 6 L/min FiO2 (%): 44 %  # CAD history of bare metal stent placement in the proximal LAD, with subsequent cardiac catheterizations showing a patent stent, continue Plavix , beta-blocker and statin combination  # Generalized anxiety-continue Xanax    # Hypertension-continue amlodipine , Imdur , losartan , metoprolol    # Pancreatic insufficiency-continue Creon    # Chronic pain-continue gabapentin    # Ulcerative colitis-continue Lialda , budesonide , colestid   # Obesity with OSA.  BMI over 30.  Follow-up with PCP. BiPAP at bedtime.  # H/O Cor Pulmonale, ? ILD versus chronic inflammatory pulmonary disease - on 3 lits o2 at home with night time Trilogy Vent - supportive Rx, CT and CTA noted no PE but have pulmonary hypertension on CTA, RV pressures appear stable on echo, lung parenchyma has some nonspecific changes, per son patient uses trilogy when at night and daytime 3 L nasal cannula oxygen  if she sleeps, usually does not use oxygen  24/7.  Pulmonary is following for now steroid challenge, nebulizer treatments, SLP follow-up and monitor.  #  Vaginal discharge.  Seen by [facility] physician, stable vaginal swab, empiric Diflucan  given.  Much improved.      Condition - Guarded  Family Communication  : Called son Elsie [phone] - 05/10/2024 at 8:19 AM, called again on 05/12/2024 at 10:27 AM.  Mailbox full, updated again at 11.10 am., updated 05/13/24, 05/14/2024  Consults.  Cardiology, OB, pulmonary  Code Status : Full code      Procedures.    CTA chest 05/13/2024.   1. No evidence of pulmonary embolus. 2. 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 Recommend correlation with echocardiography and pulmonary hypertension workup, and cardiology/pulmonology consultation; consider follow-up CTA or MRA in 6-12 months to assess stability (sooner if symptoms). 3. Ground-glass opacities within the lungs, more pronounced on the right; differential includes edema, infection/inflammation (including viral/atypical), aspiration, or hemorrhage. Recommend clinical correlation and follow-up chest CT in ~8-12 weeks to document resolution if clinically indicated. 4. Streaky dependent peribronchovascular opacities with bronchiectasis in the lower lobes bilaterally; differential includes chronic aspiration, postinfectious/postinflammatory change, or chronic small airways disease. Recommend correlation for aspiration risk and pulmonary follow-up as clinically indicated. 5. Enlarged heart with moderate calcific coronary artery disease  CT scan chest.  1. Scattered patchy pulmonary opacities, right lower lobe predominant, favoring multifocal atelectasis; superimposed right lower lobe pneumonia is possible but not favored. 2. Cardiomegaly. No interstitial edema. 3. Pulmonary arterial hypertension  Echocardiogram.  1. EF significantly  improved since TTE done 04/26/22 . Left ventricular ejection fraction, by estimation, is 60 to 65%. The left ventricle has normal function. The left ventricle has no regional wall motion abnormalities. Left ventricular diastolic parameters are consistent with Grade I diastolic dysfunction (impaired relaxation). The average left ventricular  global longitudinal strain is -21.6 %. The global longitudinal strain is normal.  2. Right ventricular systolic function is normal. The right ventricular size is normal.  3. Left atrial size was mildly dilated.  4. Right atrial size was mildly dilated.  5. The mitral valve is normal in structure. No evidence of mitral valve regurgitation. No evidence of mitral stenosis.  6. The aortic valve is tricuspid. There is mild calcification of the aortic valve. Aortic valve regurgitation is trivial. Aortic valve sclerosis is present, with no evidence of aortic valve stenosis.  7. The inferior vena cava is normal in size with greater than 50% respiratory variability, suggesting right atrial pressure of 3 mmHg   Disposition Plan  :    Status is: Inpatient   DVT Prophylaxis  :    enoxaparin  (LOVENOX ) injection 40 mg Start: 05/09/24 1000 SCDs Start: 05/08/24 2300   Lab Results  Component Value Date   PLT 628 (H) 05/15/2024    Diet :  Diet Order             Diet NPO time specified Except for: Sips with Meds  Diet effective now                    Inpatient Medications  Scheduled Meds:  [MAR Hold] amLODipine   10 mg Oral Daily   aspirin   81 mg Oral Pre-Cath   [MAR Hold] atorvastatin   20 mg Oral Daily   [MAR Hold] budesonide   9 mg Oral Daily   [MAR Hold] Chlorhexidine  Gluconate Cloth  6 each Topical Daily   [MAR Hold] clopidogrel   75 mg Oral Daily   [MAR Hold] colchicine   0.6 mg Oral BID   [MAR Hold] colestipol   2 g Oral BID   [MAR Hold] doxycycline   100 mg Oral Q12H   [MAR Hold] enoxaparin  (LOVENOX ) injection  40 mg Subcutaneous Daily   [MAR Hold]  fluticasone  furoate-vilanterol  1 puff Inhalation Daily   [MAR Hold] gabapentin   300 mg Oral BID   [MAR Hold] insulin  aspart  0-15 Units Subcutaneous TID WC   [MAR Hold] insulin  aspart  0-5 Units Subcutaneous QHS   [MAR Hold] isosorbide  mononitrate  30 mg Oral Daily   [MAR Hold] lipase/protease/amylase  24,000 Units Oral TID with meals   [MAR Hold] losartan   25 mg Oral Daily   [MAR Hold] mesalamine   2.4 g Oral BID   [MAR Hold] methylPREDNISolone  (SOLU-MEDROL ) injection  60 mg Intravenous Q24H   [MAR Hold] metoprolol  succinate  100 mg Oral Daily   [MAR Hold] morphine   15 mg Oral Q12H   [MAR Hold] nystatin    Topical BID   [MAR Hold] oxymetazoline   1 spray Each Nare BID   [MAR Hold] pantoprazole   40 mg Oral BID   [MAR Hold] tamsulosin   0.4 mg Oral QPC supper   Continuous Infusions:   PRN Meds:.[MAR Hold] acetaminophen  **OR** [MAR Hold] acetaminophen , [MAR Hold] ALPRAZolam , [MAR Hold] alum & mag hydroxide-simeth, [MAR Hold] HYDROcodone  bit-homatropine, [MAR Hold] ipratropium-albuterol , [MAR Hold] ondansetron  **OR** [MAR Hold] ondansetron  (ZOFRAN ) IV, [MAR Hold] sodium chloride   Antibiotics  :    Anti-infectives (From admission, onward)    Start     Dose/Rate Route Frequency Ordered Stop   05/11/24 1130  [MAR Hold]  doxycycline  (VIBRA -TABS) tablet 100 mg        (MAR Hold since Fri 05/15/2024 at 0831.Hold Reason: Transfer to a Procedural area)   100 mg Oral Every 12 hours 05/11/24 1034     05/10/24 2330  vancomycin  (VANCOREADY) IVPB 1500 mg/300 mL  Status:  Discontinued       Placed in Followed by Linked Group   1,500 mg 150 mL/hr over 120 Minutes Intravenous Every 48 hours 05/08/24 2316 05/11/24 1034   05/10/24 1200  fluconazole  (DIFLUCAN ) tablet 150 mg        150 mg Oral  Once 05/10/24 1109 05/10/24 1259   05/10/24 1145  fluconazole  (DIFLUCAN ) tablet 100 mg  Status:  Discontinued        100 mg Oral Daily 05/10/24 1047 05/10/24 1215   05/09/24 0600  ceFEPIme  (MAXIPIME ) 2 g in sodium  chloride 0.9 % 100 mL IVPB  Status:  Discontinued        2 g 200 mL/hr over 30 Minutes Intravenous Every 8 hours 05/08/24 2316 05/11/24 1034   05/08/24 2330  vancomycin  (VANCOREADY) IVPB 1750 mg/350 mL       Placed in Followed by Linked Group   1,750 mg 175 mL/hr over 120 Minutes Intravenous  Once 05/08/24 2316 05/09/24 0255   05/08/24 2130  doxycycline  (VIBRAMYCIN ) 100 mg in sodium chloride  0.9 % 250 mL IVPB  Status:  Discontinued        100 mg 125 mL/hr over 120 Minutes Intravenous  Once 05/08/24 2119 05/08/24 2137   05/08/24 2000  cefTRIAXone  (ROCEPHIN ) 2 g in sodium chloride  0.9 % 100 mL IVPB        2 g 200 mL/hr over 30 Minutes Intravenous Once 05/08/24 1956 05/08/24 2131   05/08/24 2000  azithromycin  (ZITHROMAX ) 500 mg in sodium chloride  0.9 % 250 mL IVPB  Status:  Discontinued        500 mg 250 mL/hr over 60 Minutes Intravenous  Once 05/08/24 1956 05/08/24 2119         Objective:   Vitals:   05/15/24 0500 05/15/24 0550 05/15/24 0748 05/15/24 0830  BP:  129/72  134/70  Pulse: 64   67  Resp: 15   16  Temp:   98 F (36.7 C)   TempSrc:      SpO2: 93%   93%  Weight: 97.3 kg       Wt Readings from Last 3 Encounters:  05/15/24 97.3 kg  02/27/24 85 kg  12/17/23 95.5 kg     Intake/Output Summary (Last 24 hours) at 05/15/2024 0848 Last data filed at 05/15/2024 0600 Gross per 24 hour  Intake 250 ml  Output 550 ml  Net -300 ml     Physical Exam  Awake Alert, No new F.N deficits, Normal affect [place].AT,PERRAL Symmetrical Chest wall movement, Good air movement bilaterally, bibasilar crackles RRR,No Gallops,Rubs or new Murmurs,  +ve B.Sounds, Abd Soft, No tenderness,   No Cyanosis, Clubbing or edema      Data Review:    Recent Labs  Lab 05/11/24 0346 05/12/24 0339 05/13/24 0331 05/14/24 0332 05/15/24 0428  WBC 9.1 8.8 9.3 10.1 11.1*  HGB 9.4* 9.5* 9.7* 10.1* 9.8*  HCT 29.6* 29.4* 29.8* 31.5* 30.1*  PLT 376 480* 535* 630* 628*  MCV 91.9 90.2 90.0 89.7  88.8  MCH 29.2 29.1 29.3 28.8 28.9  MCHC 31.8 32.3 32.6 32.1 32.6  RDW 16.1* 15.8* 15.7* 15.9* 15.4  LYMPHSABS 1.0 1.1 1.7 1.8 1.1  MONOABS 0.8 0.8 0.9 0.9 0.5  EOSABS 0.2 0.2 0.2 0.2 0.0  BASOSABS 0.0 0.0 0.0 0.0 0.0    Recent Labs  Lab 05/08/24 2016 05/08/24 2107 05/09/24 0025 05/09/24 9662 05/09/24 9662 05/10/24 9065 05/11/24 0346 05/12/24 0339 05/13/24 0331  05/14/24 0332 05/15/24 0428  NA 135  --   --  138  --  135 135 135 136 136 132*  K 5.2*  --   --  4.1  --  3.7 4.1 3.3* 4.1 3.7 3.7  CL 101  --   --  104  --  99 100 99 100 98 97*  CO2 23  --   --  23  --  24 24 27 28 27 25   ANIONGAP 11  --   --  11  --  13 11 10 8 11 11   GLUCOSE 256*  --   --  244*  --  221* 219* 238* 144* 150* 259*  BUN 8  --   --  7*  --  14 17 15 17 21  39*  CREATININE 0.89  --   --  0.74  --  0.75 0.72 0.66 0.69 0.94 1.07*  AST 22  --   --  18  --   --   --   --   --   --   --   ALT 7  --   --  5  --   --   --   --   --   --   --   ALKPHOS 78  --   --  72  --   --   --   --   --   --   --   BILITOT 0.5  --   --  0.4  --   --   --   --   --   --   --   ALBUMIN 3.5  --   --  3.2*  --   --   --   --   --   --   --   CRP  --   --   --   --   --  8.7* 5.5* 2.9* 1.4* 0.8  --   PROCALCITON  --   --   --   --    < > 0.10 <0.10 <0.10 <0.10 <0.10 <0.10  LATICACIDVEN  --  2.0* 1.1  --   --   --   --   --   --   --   --   INR 1.1  --   --   --   --   --   --   --   --   --   --   HGBA1C  --   --   --   --   --   --   --   --   --  11.5*  --   MG  --   --   --   --   --   --  1.7 1.7 1.9 1.7  --   CALCIUM  8.6*  --   --  8.1*  --  7.9* 7.9* 8.1* 8.6* 9.0 8.7*   < > = values in this interval not displayed.      Recent Labs  Lab 05/08/24 2016 05/08/24 2107 05/09/24 0025 05/09/24 9662 05/10/24 0934 05/11/24 0346 05/12/24 0339 05/13/24 0331 05/14/24 0332 05/15/24 0428  CRP  --   --   --   --  8.7* 5.5* 2.9* 1.4* 0.8  --   PROCALCITON  --   --   --    < > 0.10 <0.10 <0.10 <0.10 <0.10 <0.10   LATICACIDVEN  --  2.0* 1.1  --   --   --   --   --   --   --  INR 1.1  --   --   --   --   --   --   --   --   --   HGBA1C  --   --   --   --   --   --   --   --  11.5*  --   MG  --   --   --   --   --  1.7 1.7 1.9 1.7  --   CALCIUM  8.6*  --   --    < > 7.9* 7.9* 8.1* 8.6* 9.0 8.7*   < > = values in this interval not displayed.    --------------------------------------------------------------------------------------------------------------- Lab Results  Component Value Date   CHOL 146 06/16/2021   HDL 58.60 06/16/2021   LDLCALC 66 06/16/2021   TRIG 104.0 06/16/2021   CHOLHDL 2 06/16/2021    Lab Results  Component Value Date   HGBA1C 11.5 (H) 05/14/2024   No results for input(s): TSH, T4TOTAL, FREET4, T3FREE, THYROIDAB in the last 72 hours. No results for input(s): VITAMINB12, FOLATE, FERRITIN, TIBC, IRON , RETICCTPCT in the last 72 hours. ------------------------------------------------------------------------------------------------------------------ Cardiac Enzymes No results for input(s): CKMB, TROPONINI, MYOGLOBIN in the last 168 hours.  Invalid input(s): CK  Micro Results Recent Results (from the past 240 hours)  Resp panel by RT-PCR (RSV, Flu A&B, Covid) Anterior Nasal Swab     Status: None   Collection Time: 05/08/24  7:56 PM   Specimen: Anterior Nasal Swab  Result Value Ref Range Status   SARS Coronavirus 2 by RT PCR NEGATIVE NEGATIVE Final   Influenza A by PCR NEGATIVE NEGATIVE Final   Influenza B by PCR NEGATIVE NEGATIVE Final    Comment: (NOTE) The Xpert Xpress SARS-CoV-2/FLU/RSV plus assay is intended as an aid in the diagnosis of influenza from Nasopharyngeal swab specimens and should not be used as a sole basis for treatment. Nasal washings and aspirates are unacceptable for Xpert Xpress SARS-CoV-2/FLU/RSV testing.  Fact Sheet for Patients: bloggercourse.com  Fact Sheet for Healthcare  Providers: seriousbroker.it  This test is not yet approved or cleared by the [place]  FDA and has been authorized for detection and/or diagnosis of SARS-CoV-2 by FDA under an Emergency Use Authorization (EUA). This EUA will remain in effect (meaning this test can be used) for the duration of the COVID-19 declaration under Section 564(b)(1) of the Act, 21 U.S.C. section 360bbb-3(b)(1), unless the authorization is terminated or revoked.     Resp Syncytial Virus by PCR NEGATIVE NEGATIVE Final    Comment: (NOTE) Fact Sheet for Patients: bloggercourse.com  Fact Sheet for Healthcare Providers: seriousbroker.it  This test is not yet approved or cleared by the [place]  FDA and has been authorized for detection and/or diagnosis of SARS-CoV-2 by FDA under an Emergency Use Authorization (EUA). This EUA will remain in effect (meaning this test can be used) for the duration of the COVID-19 declaration under Section 564(b)(1) of the Act, 21 U.S.C. section 360bbb-3(b)(1), unless the authorization is terminated or revoked.  Performed at [facility], [address], [place], [place] [address]   Blood Culture (routine x 2)     Status: None   Collection Time: 05/08/24  8:16 PM   Specimen: BLOOD RIGHT ARM  Result Value Ref Range Status   Specimen Description BLOOD RIGHT ARM  Final   Special Requests   Final    BOTTLES DRAWN AEROBIC AND ANAEROBIC Blood Culture results may not be optimal due to an inadequate volume of  blood received in culture bottles   Culture   Final    NO GROWTH 5 DAYS Performed at [facility], [address], [place], [place] [address]    Report Status 05/13/2024 FINAL  Final  Blood Culture (routine x 2)     Status: None   Collection Time: 05/08/24  8:46 PM   Specimen: BLOOD RIGHT ARM  Result Value Ref Range Status   Specimen Description BLOOD RIGHT ARM  Final   Special  Requests   Final    BOTTLES DRAWN AEROBIC AND ANAEROBIC Blood Culture results may not be optimal due to an inadequate volume of blood received in culture bottles   Culture   Final    NO GROWTH 5 DAYS Performed at [facility], [address], [place], [place] [address]    Report Status 05/13/2024 FINAL  Final  Urine Culture     Status: Abnormal   Collection Time: 05/08/24 10:58 PM   Specimen: Urine, Random  Result Value Ref Range Status   Specimen Description URINE, RANDOM  Final   Special Requests   Final    NONE Reflexed from [id number] Performed at [facility], [address], [place], [place] [address]    Culture (A)  Final    20,000 COLONIES/mL MULTIPLE SPECIES PRESENT, SUGGEST RECOLLECTION   Report Status 05/10/2024 FINAL  Final  MRSA Next Gen by PCR, Nasal     Status: None   Collection Time: 05/10/24  5:24 AM   Specimen: Nasal Mucosa; Nasal Swab  Result Value Ref Range Status   MRSA by PCR Next Gen NOT DETECTED NOT DETECTED Final    Comment: (NOTE) The GeneXpert MRSA Assay (FDA approved for NASAL specimens only), is one component of a comprehensive MRSA colonization surveillance program. It is not intended to diagnose MRSA infection nor to guide or monitor treatment for MRSA infections. Test performance is not FDA approved in patients less than 3 years old. Performed at [facility], [address] [place], [place] [address]     Radiology Report ECHOCARDIOGRAM LIMITED Result Date: 05/14/2024    ECHOCARDIOGRAM LIMITED REPORT   Patient Name:   Lylian Schier Date of Exam: 05/14/2024 Medical Rec #:  [id number]     Height:       60.0 in Accession #:    [id number]    Weight:       194.9 lb Date of Birth:  October 13, 1940     BSA:          1.846 m Patient Age:    83 years      BP:           132/72 mmHg Patient Gender: F             HR:           81 bpm. Exam Location:  Inpatient Procedure: Saline Contrast Bubble Study and Limited Echo Indications:    Dyspnea  History:         Patient has prior history of Echocardiogram examinations, most                 recent 05/09/2024. CHF, CAD, Signs/Symptoms:Dyspnea and Syncope;                 Risk Factors:Hypertension, Diabetes, Sleep Apnea and Former                 Smoker.  Sonographer:    Juliene Rucks Referring Phys: [phone] PETER C  NISHAN  Sonographer Comments: Patient is obese and Technically difficult study due to poor echo windows. IMPRESSIONS  1. Limited microcavitation study; suboptimal; no obvious shunt but cannot fully exclude with this study. FINDINGS  Left Ventricle: IAS/Shunts: Agitated saline contrast was given intravenously to evaluate for intracardiac shunting. Additional Comments: Limited microcavitation study; suboptimal; no obvious shunt but cannot fully exclude with this study.  Redell Shallow MD Electronically signed by Redell Shallow MD Signature Date/Time: 05/14/2024/12:31:32 PM    Final    CT Angio Chest Pulmonary Embolism (PE) W or WO Contrast Result Date: 05/13/2024 EXAM: CTA CHEST 05/13/2024 12:08:00 PM TECHNIQUE: CTA of the chest was performed after the administration of intravenous contrast. Multiplanar reformatted images are provided for review. MIP images are provided for review. Automated exposure control, iterative reconstruction, and/or weight based adjustment of the mA/kV was utilized to reduce the radiation dose to as low as reasonably achievable. 75 mL of iohexol  (OMNIPAQUE ) 350 MG/ML injection was administered. COMPARISON: CT of the chest dated 05/08/2024. CLINICAL HISTORY: Pulmonary embolism (PE) suspected, low to intermediate prob, neg D-dimer. Pulmonary embolism is suspected with low to intermediate probability. D-dimer test is negative. FINDINGS: PULMONARY ARTERIES: Pulmonary arteries are adequately opacified for evaluation. No acute pulmonary embolus. 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 MEDIASTINUM: The heart is enlarged  and there is moderate calcific coronary artery disease present. The thoracic aorta is normal in caliber, but demonstrates moderate calcific atheromatous disease. The descending thoracic aorta is mildly tortuous. The pericardium demonstrates no acute abnormality. LYMPH NODES: No mediastinal, hilar or axillary lymphadenopathy. LUNGS AND PLEURA: There are ground-glass opacities within the lungs, more pronounced on the right. There are also streaky peribronchovascular opacities with bronchiectasis present dependently within the lower lobes bilaterally. No focal consolidation or pulmonary edema. No evidence of pleural effusion or pneumothorax. Based on these findings, the most likely differential diagnoses include chronic interstitial lung disease (e.g., non-specific interstitial pneumonia, usual interstitial pneumonia, hypersensitivity pneumonitis) or chronic infection (e.g., atypical mycobacteria). Appropriate follow-up guidelines include pulmonary function tests (PFTs), high-resolution CT (HRCT) of the chest for further characterization of interstitial lung disease, and consideration of consultation with a pulmonologist. UPPER ABDOMEN: Limited images of the upper abdomen are unremarkable. SOFT TISSUES AND BONES: No acute bone or soft tissue abnormality. IMPRESSION: 1. No evidence of pulmonary embolus. 2. 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 Recommend correlation with echocardiography and pulmonary hypertension workup, and cardiology/pulmonology consultation; consider follow-up CTA or MRA in 6-12 months to assess stability (sooner if symptoms). 3. Ground-glass opacities within the lungs, more pronounced on the right; differential includes edema, infection/inflammation (including  viral/atypical), aspiration, or hemorrhage. Recommend clinical correlation and follow-up chest CT in ~8-12 weeks to document resolution if clinically indicated. 4. Streaky dependent peribronchovascular opacities with bronchiectasis in the lower lobes bilaterally; differential includes chronic aspiration, postinfectious/postinflammatory change, or chronic small airways disease. Recommend correlation for aspiration risk and pulmonary follow-up as clinically indicated. 5. Enlarged heart with moderate calcific coronary artery disease. Electronically signed by: Evalene Coho MD 05/13/2024 12:53 PM EST RP Workstation: [id number]      Signature  -   Lavada Stank DO on 05/15/2024 at 8:48 AM   -  To page go to [email or web address]   "

## 2024-05-15 NOTE — Progress Notes (Signed)
 Patient refused pre schedule aspirin  81mg  for heart catheterization stating it makes her stomach upset.Made cath lab staff aware

## 2024-05-15 NOTE — H&P (View-Only) (Signed)
 "  Cardiologist:  Skains/Bensimohn  Subjective:  No complaints still with cough/dyspnea when moving  Objective:  Vitals:   05/15/24 0500 05/15/24 0550 05/15/24 0748 05/15/24 0830  BP:  129/72  134/70  Pulse: 64   67  Resp: 15   16  Temp:   98 F (36.7 C)   TempSrc:      SpO2: 93%   93%  Weight: 97.3 kg       Intake/Output from previous day:  Intake/Output Summary (Last 24 hours) at 05/15/2024 0859 Last data filed at 05/15/2024 0600 Gross per 24 hour  Intake 250 ml  Output 550 ml  Net -300 ml    Physical Exam:  Obese black female Slow verbalization  JVP hard to assess No significant murmur Abdomen benign Pockets of egophony and rhonchi with wheezing on lung exam Plus one edema   Lab Results: Basic Metabolic Panel: Recent Labs    05/13/24 0331 05/14/24 0332 05/15/24 0428  NA 136 136 132*  K 4.1 3.7 3.7  CL 100 98 97*  CO2 28 27 25   GLUCOSE 144* 150* 259*  BUN 17 21 39*  CREATININE 0.69 0.94 1.07*  CALCIUM  8.6* 9.0 8.7*  MG 1.9 1.7  --    Liver Function Tests: No results for input(s): AST, ALT, ALKPHOS, BILITOT, PROT, ALBUMIN in the last 72 hours. No results for input(s): LIPASE, AMYLASE in the last 72 hours. CBC: Recent Labs    05/14/24 0332 05/15/24 0428  WBC 10.1 11.1*  NEUTROABS 7.1 9.4*  HGB 10.1* 9.8*  HCT 31.5* 30.1*  MCV 89.7 88.8  PLT 630* 628*    Imaging: ECHOCARDIOGRAM LIMITED Result Date: 05/14/2024    ECHOCARDIOGRAM LIMITED REPORT   Patient Name:   [name] Date of Exam: 05/14/2024 Medical Rec #:  [id number]     Height:       60.0 in Accession #:    [id number]    Weight:       194.9 lb Date of Birth:  [birth year]     BSA:          1.846 m Patient Age:    83 years      BP:           132/72 mmHg Patient Gender: F             HR:           81 bpm. Exam Location:  Inpatient Procedure: Saline Contrast Bubble Study and Limited Echo Indications:    Dyspnea  History:        Patient has prior history of Echocardiogram  examinations, most                 recent 05/09/2024. CHF, CAD, Signs/Symptoms:Dyspnea and Syncope;                 Risk Factors:Hypertension, Diabetes, Sleep Apnea and Former                 Smoker.  Sonographer:    Juliene Rucks Referring Phys: [phone] MAUDE JAYSON EMMER  Sonographer Comments: Patient is obese and Technically difficult study due to poor echo windows. IMPRESSIONS  1. Limited microcavitation study; suboptimal; no obvious shunt but cannot fully exclude with this study. FINDINGS  Left Ventricle: IAS/Shunts: Agitated saline contrast was given intravenously to evaluate for intracardiac shunting. Additional Comments: Limited microcavitation study; suboptimal; no obvious shunt but cannot fully exclude with this study.  Redell Shallow MD Electronically signed by Redell Shallow MD Signature  Date/Time: 05/14/2024/12:31:32 PM    Final    CT Angio Chest Pulmonary Embolism (PE) W or WO Contrast Result Date: 05/13/2024 EXAM: CTA CHEST 05/13/2024 12:08:00 PM TECHNIQUE: CTA of the chest was performed after the administration of intravenous contrast. Multiplanar reformatted images are provided for review. MIP images are provided for review. Automated exposure control, iterative reconstruction, and/or weight based adjustment of the mA/kV was utilized to reduce the radiation dose to as low as reasonably achievable. 75 mL of iohexol  (OMNIPAQUE ) 350 MG/ML injection was administered. COMPARISON: CT of the chest dated 05/08/2024. CLINICAL HISTORY: Pulmonary embolism (PE) suspected, low to intermediate prob, neg D-dimer. Pulmonary embolism is suspected with low to intermediate probability. D-dimer test is negative. FINDINGS: PULMONARY ARTERIES: Pulmonary arteries are adequately opacified for evaluation. No acute pulmonary embolus. 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 MEDIASTINUM: The heart is enlarged and there is moderate calcific coronary artery  disease present. The thoracic aorta is normal in caliber, but demonstrates moderate calcific atheromatous disease. The descending thoracic aorta is mildly tortuous. The pericardium demonstrates no acute abnormality. LYMPH NODES: No mediastinal, hilar or axillary lymphadenopathy. LUNGS AND PLEURA: There are ground-glass opacities within the lungs, more pronounced on the right. There are also streaky peribronchovascular opacities with bronchiectasis present dependently within the lower lobes bilaterally. No focal consolidation or pulmonary edema. No evidence of pleural effusion or pneumothorax. Based on these findings, the most likely differential diagnoses include chronic interstitial lung disease (e.g., non-specific interstitial pneumonia, usual interstitial pneumonia, hypersensitivity pneumonitis) or chronic infection (e.g., atypical mycobacteria). Appropriate follow-up guidelines include pulmonary function tests (PFTs), high-resolution CT (HRCT) of the chest for further characterization of interstitial lung disease, and consideration of consultation with a pulmonologist. UPPER ABDOMEN: Limited images of the upper abdomen are unremarkable. SOFT TISSUES AND BONES: No acute bone or soft tissue abnormality. IMPRESSION: 1. No evidence of pulmonary embolus. 2. 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 Recommend correlation with echocardiography and pulmonary hypertension workup, and cardiology/pulmonology consultation; consider follow-up CTA or MRA in 6-12 months to assess stability (sooner if symptoms). 3. Ground-glass opacities within the lungs, more pronounced on the right; differential includes edema, infection/inflammation (including viral/atypical), aspiration, or hemorrhage.  Recommend clinical correlation and follow-up chest CT in ~8-12 weeks to document resolution if clinically indicated. 4. Streaky dependent peribronchovascular opacities with bronchiectasis in the lower lobes bilaterally; differential includes chronic aspiration, postinfectious/postinflammatory change, or chronic small airways disease. Recommend correlation for aspiration risk and pulmonary follow-up as clinically indicated. 5. Enlarged heart with moderate calcific coronary artery disease. Electronically signed by: Evalene Coho MD 05/13/2024 12:53 PM EST RP Workstation: [id number]    Cardiac Studies:  ECG: SR low voltage rate 92   Telemetry:  NSR   Echo: Echocardiogram:  05/09/24     1. EF significantly improved since TTE done 04/26/22 . Left ventricular  ejection fraction, by estimation, is 60 to 65%. The left ventricle has  normal function. The left ventricle has no regional wall motion  abnormalities. Left ventricular diastolic  parameters are consistent with Grade I diastolic dysfunction (impaired  relaxation). The average left ventricular global longitudinal strain is  -21.6 %. The global longitudinal strain is normal.   2. Right ventricular systolic function is normal. The right ventricular  size is normal.   3. Left  atrial size was mildly dilated.   4. Right atrial size was mildly dilated.   5. The mitral valve is normal in structure. No evidence of mitral valve  regurgitation. No evidence of mitral stenosis.   6. The aortic valve is tricuspid. There is mild calcification of the  aortic valve. Aortic valve regurgitation is trivial. Aortic valve  sclerosis is present, with no evidence of aortic valve stenosis.   7. The inferior vena cava is normal in size with greater than 50%  respiratory variability, suggesting right atrial pressure of 3 mmHg.   Heart Catheterization 08/07/2021    Conclusion       Dist LAD lesion is 90% stenosed.   Prox RCA lesion is 30% stenosed.   Dist  RCA lesion is 30% stenosed.   Prox LAD to Mid LAD lesion is 30% stenosed.   The left ventricular ejection fraction is 55-65% by visual estimate.   Findings:   Ao = 167/84 (118) LV = 152/11 RA =  8 RV = 64/11 PA = 68/26 (45) PCW = 15 Fick cardiac output/index = 6.0/3.0 PVR = 5.0 WU FA sat = 92% PA sat = 66%, 67%   Assessment: 1. Predominantly mild to moderate non-obstructive CAD with 90% lesion in apical LAD 2. Normal LV function 3. Moderate PAH   Plan/Discussion:    Medical therapy for CAD. Likely Group 3 PAH. Will need evaluation for OSA/OHS  Medications:    [MAR Hold] amLODipine   10 mg Oral Daily   aspirin   81 mg Oral Pre-Cath   [MAR Hold] atorvastatin   20 mg Oral Daily   [MAR Hold] budesonide   9 mg Oral Daily   [MAR Hold] Chlorhexidine  Gluconate Cloth  6 each Topical Daily   [MAR Hold] clopidogrel   75 mg Oral Daily   [MAR Hold] colchicine   0.6 mg Oral BID   [MAR Hold] colestipol   2 g Oral BID   [MAR Hold] doxycycline   100 mg Oral Q12H   [MAR Hold] enoxaparin  (LOVENOX ) injection  40 mg Subcutaneous Daily   [MAR Hold] fluticasone  furoate-vilanterol  1 puff Inhalation Daily   [MAR Hold] gabapentin   300 mg Oral BID   [MAR Hold] insulin  aspart  0-15 Units Subcutaneous TID WC   [MAR Hold] insulin  aspart  0-5 Units Subcutaneous QHS   [MAR Hold] isosorbide  mononitrate  30 mg Oral Daily   [MAR Hold] lipase/protease/amylase  24,000 Units Oral TID with meals   [MAR Hold] losartan   25 mg Oral Daily   [MAR Hold] mesalamine   2.4 g Oral BID   [MAR Hold] methylPREDNISolone  (SOLU-MEDROL ) injection  60 mg Intravenous Q24H   [MAR Hold] metoprolol  succinate  100 mg Oral Daily   [MAR Hold] morphine   15 mg Oral Q12H   [MAR Hold] nystatin    Topical BID   [MAR Hold] oxymetazoline   1 spray Each Nare BID   [MAR Hold] pantoprazole   40 mg Oral BID   [MAR Hold] tamsulosin   0.4 mg Oral QPC supper      Assessment/Plan:  Hypoxia:  there may be a component of diastolic dysfunction as  evidenced by initially elevated BNP 1166 that has improved with good diuresis to 258. However she still has hypoxia despite good diuresis. She has known WHO Class 3 PHT by cath 08/07/21 moderate  PA 68/26 mmHg with PAd significantly higher than PCW of 15 mmHg At that time PVR was 5.0 WU's. Her CT is pretty abnormal with interstitial lung changes and bronchiectasis with no effusion or current fluid. Her main  PA is severely dilated over 5 cm again suggesting chronic PHT. There is no ASD/VSD on echo CTA shows normal PV drainage. I doubt there is a significant cardiac shunt. Echo bubble study done yesterday with no shunt laying/sitting up No platypnea orthodeoxia. Discussed procedure with son She is having cath this am with Dr Rolan Pulmonary to advise post right heart cath Suspect she will have significant PHT and negative shunt run  CAD:  cath 2023 with only distal LAD stenosis 90% and echo with normal LV function not playing a role    Maude Emmer 05/15/2024, 8:59 AM    "

## 2024-05-15 NOTE — Plan of Care (Signed)
  Problem: Clinical Measurements: Goal: Ability to maintain clinical measurements within normal limits will improve Outcome: Progressing Goal: Will remain free from infection Outcome: Progressing Goal: Respiratory complications will improve Outcome: Progressing   Problem: Activity: Goal: Risk for activity intolerance will decrease Outcome: Progressing   Problem: Safety: Goal: Ability to remain free from injury will improve Outcome: Progressing

## 2024-05-15 NOTE — Progress Notes (Signed)
 "  Cardiologist:  Skains/Bensimohn  Subjective:  No complaints still with cough/dyspnea when moving  Objective:  Vitals:   05/15/24 0500 05/15/24 0550 05/15/24 0748 05/15/24 0830  BP:  129/72  134/70  Pulse: 64   67  Resp: 15   16  Temp:   98 F (36.7 C)   TempSrc:      SpO2: 93%   93%  Weight: 97.3 kg       Intake/Output from previous day:  Intake/Output Summary (Last 24 hours) at 05/15/2024 0859 Last data filed at 05/15/2024 0600 Gross per 24 hour  Intake 250 ml  Output 550 ml  Net -300 ml    Physical Exam:  Obese black female Slow verbalization  JVP hard to assess No significant murmur Abdomen benign Pockets of egophony and rhonchi with wheezing on lung exam Plus one edema   Lab Results: Basic Metabolic Panel: Recent Labs    05/13/24 0331 05/14/24 0332 05/15/24 0428  NA 136 136 132*  K 4.1 3.7 3.7  CL 100 98 97*  CO2 28 27 25   GLUCOSE 144* 150* 259*  BUN 17 21 39*  CREATININE 0.69 0.94 1.07*  CALCIUM  8.6* 9.0 8.7*  MG 1.9 1.7  --    Liver Function Tests: No results for input(s): AST, ALT, ALKPHOS, BILITOT, PROT, ALBUMIN in the last 72 hours. No results for input(s): LIPASE, AMYLASE in the last 72 hours. CBC: Recent Labs    05/14/24 0332 05/15/24 0428  WBC 10.1 11.1*  NEUTROABS 7.1 9.4*  HGB 10.1* 9.8*  HCT 31.5* 30.1*  MCV 89.7 88.8  PLT 630* 628*    Imaging: ECHOCARDIOGRAM LIMITED Result Date: 05/14/2024    ECHOCARDIOGRAM LIMITED REPORT   Patient Name:   Taviana Cuthrell Date of Exam: 05/14/2024 Medical Rec #:  [id number]     Height:       60.0 in Accession #:    [id number]    Weight:       194.9 lb Date of Birth:  [birth year]     BSA:          1.846 m Patient Age:    83 years      BP:           132/72 mmHg Patient Gender: F             HR:           81 bpm. Exam Location:  Inpatient Procedure: Saline Contrast Bubble Study and Limited Echo Indications:    Dyspnea  History:        Patient has prior history of Echocardiogram  examinations, most                 recent 05/09/2024. CHF, CAD, Signs/Symptoms:Dyspnea and Syncope;                 Risk Factors:Hypertension, Diabetes, Sleep Apnea and Former                 Smoker.  Sonographer:    Juliene Rucks Referring Phys: [phone] MAUDE JAYSON EMMER  Sonographer Comments: Patient is obese and Technically difficult study due to poor echo windows. IMPRESSIONS  1. Limited microcavitation study; suboptimal; no obvious shunt but cannot fully exclude with this study. FINDINGS  Left Ventricle: IAS/Shunts: Agitated saline contrast was given intravenously to evaluate for intracardiac shunting. Additional Comments: Limited microcavitation study; suboptimal; no obvious shunt but cannot fully exclude with this study.  Redell Shallow MD Electronically signed by Redell Shallow MD Signature  Date/Time: 05/14/2024/12:31:32 PM    Final    CT Angio Chest Pulmonary Embolism (PE) W or WO Contrast Result Date: 05/13/2024 EXAM: CTA CHEST 05/13/2024 12:08:00 PM TECHNIQUE: CTA of the chest was performed after the administration of intravenous contrast. Multiplanar reformatted images are provided for review. MIP images are provided for review. Automated exposure control, iterative reconstruction, and/or weight based adjustment of the mA/kV was utilized to reduce the radiation dose to as low as reasonably achievable. 75 mL of iohexol  (OMNIPAQUE ) 350 MG/ML injection was administered. COMPARISON: CT of the chest dated 05/08/2024. CLINICAL HISTORY: Pulmonary embolism (PE) suspected, low to intermediate prob, neg D-dimer. Pulmonary embolism is suspected with low to intermediate probability. D-dimer test is negative. FINDINGS: PULMONARY ARTERIES: Pulmonary arteries are adequately opacified for evaluation. No acute pulmonary embolus. 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 MEDIASTINUM: The heart is enlarged and there is moderate calcific coronary artery  disease present. The thoracic aorta is normal in caliber, but demonstrates moderate calcific atheromatous disease. The descending thoracic aorta is mildly tortuous. The pericardium demonstrates no acute abnormality. LYMPH NODES: No mediastinal, hilar or axillary lymphadenopathy. LUNGS AND PLEURA: There are ground-glass opacities within the lungs, more pronounced on the right. There are also streaky peribronchovascular opacities with bronchiectasis present dependently within the lower lobes bilaterally. No focal consolidation or pulmonary edema. No evidence of pleural effusion or pneumothorax. Based on these findings, the most likely differential diagnoses include chronic interstitial lung disease (e.g., non-specific interstitial pneumonia, usual interstitial pneumonia, hypersensitivity pneumonitis) or chronic infection (e.g., atypical mycobacteria). Appropriate follow-up guidelines include pulmonary function tests (PFTs), high-resolution CT (HRCT) of the chest for further characterization of interstitial lung disease, and consideration of consultation with a pulmonologist. UPPER ABDOMEN: Limited images of the upper abdomen are unremarkable. SOFT TISSUES AND BONES: No acute bone or soft tissue abnormality. IMPRESSION: 1. No evidence of pulmonary embolus. 2. 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 Recommend correlation with echocardiography and pulmonary hypertension workup, and cardiology/pulmonology consultation; consider follow-up CTA or MRA in 6-12 months to assess stability (sooner if symptoms). 3. Ground-glass opacities within the lungs, more pronounced on the right; differential includes edema, infection/inflammation (including viral/atypical), aspiration, or hemorrhage.  Recommend clinical correlation and follow-up chest CT in ~8-12 weeks to document resolution if clinically indicated. 4. Streaky dependent peribronchovascular opacities with bronchiectasis in the lower lobes bilaterally; differential includes chronic aspiration, postinfectious/postinflammatory change, or chronic small airways disease. Recommend correlation for aspiration risk and pulmonary follow-up as clinically indicated. 5. Enlarged heart with moderate calcific coronary artery disease. Electronically signed by: Evalene Coho MD 05/13/2024 12:53 PM EST RP Workstation: [id number]    Cardiac Studies:  ECG: SR low voltage rate 92   Telemetry:  NSR   Echo: Echocardiogram:  05/09/24     1. EF significantly improved since TTE done 04/26/22 . Left ventricular  ejection fraction, by estimation, is 60 to 65%. The left ventricle has  normal function. The left ventricle has no regional wall motion  abnormalities. Left ventricular diastolic  parameters are consistent with Grade I diastolic dysfunction (impaired  relaxation). The average left ventricular global longitudinal strain is  -21.6 %. The global longitudinal strain is normal.   2. Right ventricular systolic function is normal. The right ventricular  size is normal.   3. Left  atrial size was mildly dilated.   4. Right atrial size was mildly dilated.   5. The mitral valve is normal in structure. No evidence of mitral valve  regurgitation. No evidence of mitral stenosis.   6. The aortic valve is tricuspid. There is mild calcification of the  aortic valve. Aortic valve regurgitation is trivial. Aortic valve  sclerosis is present, with no evidence of aortic valve stenosis.   7. The inferior vena cava is normal in size with greater than 50%  respiratory variability, suggesting right atrial pressure of 3 mmHg.   Heart Catheterization 08/07/2021    Conclusion       Dist LAD lesion is 90% stenosed.   Prox RCA lesion is 30% stenosed.   Dist  RCA lesion is 30% stenosed.   Prox LAD to Mid LAD lesion is 30% stenosed.   The left ventricular ejection fraction is 55-65% by visual estimate.   Findings:   Ao = 167/84 (118) LV = 152/11 RA =  8 RV = 64/11 PA = 68/26 (45) PCW = 15 Fick cardiac output/index = 6.0/3.0 PVR = 5.0 WU FA sat = 92% PA sat = 66%, 67%   Assessment: 1. Predominantly mild to moderate non-obstructive CAD with 90% lesion in apical LAD 2. Normal LV function 3. Moderate PAH   Plan/Discussion:    Medical therapy for CAD. Likely Group 3 PAH. Will need evaluation for OSA/OHS  Medications:    [MAR Hold] amLODipine   10 mg Oral Daily   aspirin   81 mg Oral Pre-Cath   [MAR Hold] atorvastatin   20 mg Oral Daily   [MAR Hold] budesonide   9 mg Oral Daily   [MAR Hold] Chlorhexidine  Gluconate Cloth  6 each Topical Daily   [MAR Hold] clopidogrel   75 mg Oral Daily   [MAR Hold] colchicine   0.6 mg Oral BID   [MAR Hold] colestipol   2 g Oral BID   [MAR Hold] doxycycline   100 mg Oral Q12H   [MAR Hold] enoxaparin  (LOVENOX ) injection  40 mg Subcutaneous Daily   [MAR Hold] fluticasone  furoate-vilanterol  1 puff Inhalation Daily   [MAR Hold] gabapentin   300 mg Oral BID   [MAR Hold] insulin  aspart  0-15 Units Subcutaneous TID WC   [MAR Hold] insulin  aspart  0-5 Units Subcutaneous QHS   [MAR Hold] isosorbide  mononitrate  30 mg Oral Daily   [MAR Hold] lipase/protease/amylase  24,000 Units Oral TID with meals   [MAR Hold] losartan   25 mg Oral Daily   [MAR Hold] mesalamine   2.4 g Oral BID   [MAR Hold] methylPREDNISolone  (SOLU-MEDROL ) injection  60 mg Intravenous Q24H   [MAR Hold] metoprolol  succinate  100 mg Oral Daily   [MAR Hold] morphine   15 mg Oral Q12H   [MAR Hold] nystatin    Topical BID   [MAR Hold] oxymetazoline   1 spray Each Nare BID   [MAR Hold] pantoprazole   40 mg Oral BID   [MAR Hold] tamsulosin   0.4 mg Oral QPC supper      Assessment/Plan:  Hypoxia:  there may be a component of diastolic dysfunction as  evidenced by initially elevated BNP 1166 that has improved with good diuresis to 258. However she still has hypoxia despite good diuresis. She has known WHO Class 3 PHT by cath 08/07/21 moderate  PA 68/26 mmHg with PAd significantly higher than PCW of 15 mmHg At that time PVR was 5.0 WU's. Her CT is pretty abnormal with interstitial lung changes and bronchiectasis with no effusion or current fluid. Her main  PA is severely dilated over 5 cm again suggesting chronic PHT. There is no ASD/VSD on echo CTA shows normal PV drainage. I doubt there is a significant cardiac shunt. Echo bubble study done yesterday with no shunt laying/sitting up No platypnea orthodeoxia. Discussed procedure with son She is having cath this am with Dr Rolan Pulmonary to advise post right heart cath Suspect she will have significant PHT and negative shunt run  CAD:  cath 2023 with only distal LAD stenosis 90% and echo with normal LV function not playing a role    Maude Emmer 05/15/2024, 8:59 AM    "

## 2024-05-15 NOTE — Telephone Encounter (Signed)
 Pharmacy Patient Advocate Encounter  Insurance verification completed.    The patient is insured through [employer]/ [employer]. Patient has Medicare and is not eligible for a copay card, but may be able to apply for patient assistance or Medicare RX Payment Plan (Patient Must reach out to their plan, if eligible for payment plan), if available.    Ran test claim for FSL 3Plus sensor and the current 30 day co-pay is $0.   This test claim was processed through [facility]- copay amounts may vary at other pharmacies due to [employer], or as the patient moves through the different stages of their insurance plan.

## 2024-05-15 NOTE — Interval H&P Note (Signed)
 History and Physical Interval Note:  05/15/2024 9:25 AM  [name]  has presented today for surgery, with the diagnosis of hp.  The various methods of treatment have been discussed with the patient and family. After consideration of risks, benefits and other options for treatment, the patient has consented to  Procedures: RIGHT HEART CATH (N/A) as a surgical intervention.  [name] history has been reviewed, patient examined, no change in status, stable for surgery.  I have reviewed the patient's chart and labs.  Questions were answered to the patient's satisfaction.     Shyheim Tanney [employer]

## 2024-05-16 ENCOUNTER — Encounter (HOSPITAL_COMMUNITY): Payer: Self-pay | Admitting: Cardiology

## 2024-05-16 ENCOUNTER — Inpatient Hospital Stay (HOSPITAL_COMMUNITY)

## 2024-05-16 DIAGNOSIS — J479 Bronchiectasis, uncomplicated: Secondary | ICD-10-CM

## 2024-05-16 DIAGNOSIS — J9622 Acute and chronic respiratory failure with hypercapnia: Secondary | ICD-10-CM | POA: Diagnosis not present

## 2024-05-16 DIAGNOSIS — I110 Hypertensive heart disease with heart failure: Secondary | ICD-10-CM | POA: Diagnosis not present

## 2024-05-16 DIAGNOSIS — I5033 Acute on chronic diastolic (congestive) heart failure: Secondary | ICD-10-CM | POA: Diagnosis not present

## 2024-05-16 DIAGNOSIS — J45909 Unspecified asthma, uncomplicated: Secondary | ICD-10-CM

## 2024-05-16 DIAGNOSIS — J9621 Acute and chronic respiratory failure with hypoxia: Secondary | ICD-10-CM | POA: Diagnosis not present

## 2024-05-16 DIAGNOSIS — I2510 Atherosclerotic heart disease of native coronary artery without angina pectoris: Secondary | ICD-10-CM | POA: Diagnosis not present

## 2024-05-16 DIAGNOSIS — I2781 Cor pulmonale (chronic): Secondary | ICD-10-CM

## 2024-05-16 DIAGNOSIS — I272 Pulmonary hypertension, unspecified: Secondary | ICD-10-CM | POA: Diagnosis not present

## 2024-05-16 DIAGNOSIS — J159 Unspecified bacterial pneumonia: Secondary | ICD-10-CM | POA: Diagnosis not present

## 2024-05-16 LAB — BASIC METABOLIC PANEL WITH GFR
Anion gap: 13 (ref 5–15)
BUN: 42 mg/dL — ABNORMAL HIGH (ref 8–23)
CO2: 22 mmol/L (ref 22–32)
Calcium: 8.6 mg/dL — ABNORMAL LOW (ref 8.9–10.3)
Chloride: 96 mmol/L — ABNORMAL LOW (ref 98–111)
Creatinine, Ser: 0.9 mg/dL (ref 0.44–1.00)
GFR, Estimated: >60 mL/min
Glucose, Bld: 340 mg/dL — ABNORMAL HIGH (ref 70–99)
Potassium: 3.7 mmol/L (ref 3.5–5.1)
Sodium: 131 mmol/L — ABNORMAL LOW (ref 135–145)

## 2024-05-16 LAB — CBC WITH DIFFERENTIAL/PLATELET
Abs Immature Granulocytes: 0.13 K/uL — ABNORMAL HIGH (ref 0.00–0.07)
Basophils Absolute: 0 K/uL (ref 0.0–0.1)
Basophils Relative: 0 %
Eosinophils Absolute: 0 K/uL (ref 0.0–0.5)
Eosinophils Relative: 0 %
HCT: 30.4 % — ABNORMAL LOW (ref 36.0–46.0)
Hemoglobin: 9.8 g/dL — ABNORMAL LOW (ref 12.0–15.0)
Immature Granulocytes: 1 %
Lymphocytes Relative: 10 %
Lymphs Abs: 1.2 K/uL (ref 0.7–4.0)
MCH: 28.8 pg (ref 26.0–34.0)
MCHC: 32.2 g/dL (ref 30.0–36.0)
MCV: 89.4 fL (ref 80.0–100.0)
Monocytes Absolute: 0.6 K/uL (ref 0.1–1.0)
Monocytes Relative: 5 %
Neutro Abs: 10.1 K/uL — ABNORMAL HIGH (ref 1.7–7.7)
Neutrophils Relative %: 84 %
Platelets: 588 K/uL — ABNORMAL HIGH (ref 150–400)
RBC: 3.4 MIL/uL — ABNORMAL LOW (ref 3.87–5.11)
RDW: 15.5 % (ref 11.5–15.5)
WBC: 12.1 K/uL — ABNORMAL HIGH (ref 4.0–10.5)
nRBC: 0 % (ref 0.0–0.2)

## 2024-05-16 LAB — GLUCOSE, CAPILLARY
Glucose-Capillary: 301 mg/dL — ABNORMAL HIGH (ref 70–99)
Glucose-Capillary: 252 mg/dL — ABNORMAL HIGH (ref 70–99)
Glucose-Capillary: 244 mg/dL — ABNORMAL HIGH (ref 70–99)
Glucose-Capillary: 335 mg/dL — ABNORMAL HIGH (ref 70–99)

## 2024-05-16 LAB — MAGNESIUM: Magnesium: 1.6 mg/dL — ABNORMAL LOW (ref 1.7–2.4)

## 2024-05-16 MED ORDER — MAGNESIUM SULFATE 4 GM/100ML IV SOLN
4.0000 g | Freq: Once | INTRAVENOUS | Status: AC
  Administered 2024-05-16: 4 g via INTRAVENOUS
  Filled 2024-05-16: qty 100

## 2024-05-16 MED ORDER — INSULIN GLARGINE 100 UNIT/ML ~~LOC~~ SOLN: 12.0000 [IU] | Freq: Every day | SUBCUTANEOUS | Status: DC

## 2024-05-16 MED ORDER — INSULIN GLARGINE 100 UNIT/ML ~~LOC~~ SOLN
15.0000 [IU] | Freq: Every day | SUBCUTANEOUS | Status: DC
  Administered 2024-05-16: 15 [IU] via SUBCUTANEOUS
  Filled 2024-05-16 (×2): qty 0.15

## 2024-05-16 NOTE — Progress Notes (Signed)
 "   Progress Note  Patient Name: [name] Date of Encounter: 05/16/2024  Primary Cardiologist:   Oneil Parchment, MD   Subjective   She says that her breathing is better than on admission but still not at baseline.  On 4l [place]  Inpatient Medications    Scheduled Meds:  amLODipine   10 mg Oral Daily   atorvastatin   20 mg Oral Daily   budesonide   9 mg Oral Daily   Chlorhexidine  Gluconate Cloth  6 each Topical Daily   clopidogrel   75 mg Oral Daily   colchicine   0.6 mg Oral BID   colestipol   2 g Oral BID   doxycycline   100 mg Oral Q12H   enoxaparin  (LOVENOX ) injection  40 mg Subcutaneous Daily   fluticasone  furoate-vilanterol  1 puff Inhalation Daily   gabapentin   300 mg Oral BID   insulin  aspart  0-15 Units Subcutaneous TID WC   insulin  aspart  0-5 Units Subcutaneous QHS   insulin  glargine  15 Units Subcutaneous Daily   isosorbide  mononitrate  30 mg Oral Daily   lipase/protease/amylase  24,000 Units Oral TID with meals   losartan   25 mg Oral Daily   mesalamine   2.4 g Oral BID   methylPREDNISolone  (SOLU-MEDROL ) injection  60 mg Intravenous Q24H   metoprolol  succinate  100 mg Oral Daily   morphine   15 mg Oral Q12H   nystatin    Topical BID   pantoprazole   40 mg Oral BID   sodium chloride  flush  3 mL Intravenous Q12H   tamsulosin   0.4 mg Oral QPC supper   Continuous Infusions:  magnesium  sulfate bolus IVPB 4 g (05/16/24 1016)   PRN Meds: acetaminophen  **OR** acetaminophen , ALPRAZolam , alum & mag hydroxide-simeth, HYDROcodone  bit-homatropine, ipratropium-albuterol , ondansetron  **OR** ondansetron  (ZOFRAN ) IV, sodium chloride , sodium chloride  flush   Vital Signs    Vitals:   05/16/24 0442 05/16/24 0443 05/16/24 0700 05/16/24 0916  BP: 117/67  125/61   Pulse: 66     Resp: 20     Temp: 98 F (36.7 C)  98.2 F (36.8 C)   TempSrc: Oral  Oral   SpO2: 94%   96%  Weight:  93.5 kg      Intake/Output Summary (Last 24 hours) at 05/16/2024 1126 Last data filed at 05/16/2024  0442 Gross per 24 hour  Intake 253 ml  Output 1100 ml  Net -847 ml   Filed Weights   05/13/24 1738 05/15/24 0500 05/16/24 0443  Weight: 88.4 kg 97.3 kg 93.5 kg    Telemetry    NSR - Personally Reviewed  ECG    NA - Personally Reviewed  Physical Exam   GEN: No acute distress.   Neck: No  JVD Cardiac: RRR, no murmurs, rubs, or gallops.  Respiratory:    Decreased breath sounds with bilateral basilar crackles.  GI: Soft, nontender, non-distended  MS: No  edema; No deformity. Neuro:  Nonfocal  Psych: Normal affect   Labs    Chemistry Recent Labs  Lab 05/14/24 0332 05/15/24 0428 05/15/24 0935 05/16/24 0503  NA 136 132* 138 131*  K 3.7 3.7 3.4* 3.7  CL 98 97*  --  96*  CO2 27 25  --  22  GLUCOSE 150* 259*  --  340*  BUN 21 39*  --  42*  CREATININE 0.94 1.07*  --  0.90  CALCIUM  9.0 8.7*  --  8.6*  GFRNONAA >60 52*  --  >60  ANIONGAP 11 11  --  13  Hematology Recent Labs  Lab 05/14/24 0332 05/15/24 0428 05/15/24 0935 05/16/24 0503  WBC 10.1 11.1*  --  12.1*  RBC 3.51* 3.39*  --  3.40*  HGB 10.1* 9.8* 10.9* 9.8*  HCT 31.5* 30.1* 32.0* 30.4*  MCV 89.7 88.8  --  89.4  MCH 28.8 28.9  --  28.8  MCHC 32.1 32.6  --  32.2  RDW 15.9* 15.4  --  15.5  PLT 630* 628*  --  588*    Cardiac EnzymesNo results for input(s): TROPONINI in the last 168 hours. No results for input(s): TROPIPOC in the last 168 hours.   BNP Recent Labs  Lab 05/10/24 0934 05/13/24 0331  PROBNP 369.0* 258.0     DDimer No results for input(s): DDIMER in the last 168 hours.   Radiology    DG Chest Port 1 View Result Date: 05/16/2024 EXAM: 1 VIEW XRAY OF THE CHEST 05/16/2024 07:05:00 AM COMPARISON: Portable chest 05/13/2024. CLINICAL HISTORY: SOB (shortness of breath). FINDINGS: LUNGS AND PLEURA: There is streaky atelectasis or infiltrate in the left lower lobe, linear right mid perihilar atelectasis. The lungs are otherwise clear with stable overall aeration. No pleural  effusion. No pneumothorax. HEART AND MEDIASTINUM: The heart is enlarged. Perihilar vascular congestion continues to be seen. Interstitial edema is no longer identified. The aorta is tortuous and calcified with a stable mediastinum. BONES AND SOFT TISSUES: No acute osseous abnormality. IMPRESSION: 1. Enlarged heart with persistent perihilar vascular congestion and no appreciable interstitial edema. 2. Streaky atelectasis or infiltrate in the left lower lobe and linear right mid perihilar atelectasis. Electronically signed by: Francis Quam MD 05/16/2024 07:37 AM EST RP Workstation: [id number]   CARDIAC CATHETERIZATION Result Date: 05/15/2024 1. Relatively high cardiac output but no evidence for left=>right shunting. 2. Optimized filling pressures. 3. Mild pulmonary hypertension.  PVR not significantly elevated given relatively high cardiac output. 4. Preserved PAPi.    Cardiac Studies   Cardiac cath  Right Heart Pressures RHC Procedural Findings: Hemodynamics (mmHg) RA mean 5 RV 51/12 PA 46/20, mean 29 PCWP mean 11  Oxygen  saturations: SVC 70% RA 70% RV 68% PA 66% AO 93%  Cardiac Output (Fick) 8.33  Cardiac Index (Fick) 4.34  PVR 2.5 WU  Cardiac Output (Thermo) 6.22  Cardiac Index (Thermo) 3.24  PVR 2.9 WU  PAPi 5.2    Patient Profile     84 y.o. female  with a hx of CAD s/p BMS to LAD 2002 with residual disease 2023 managed medically, chronic HFimpEF, chronic edema, pulmonary HTN, chronic hypoxic and hypercapnic respiratory failure, prolonged QTc, HTN, HLD, DM, UC, morbid obesity, fibromyalgia, prior angiomyolipoma versus RCC, diverticulosis, pancreatitis, aspirin  intolerance who is being seen 05/13/2024 for the evaluation of CHF at the request of Dr. Dennise.   Assessment & Plan    Hypoxic respiratory failure:  Right heart cath with mild pulm HTN and no evidence of shunting.   She has had good diuresis.   PCWP mean 11.  Given this we have backed off on diuresis and she is being  managed with solumedrol.    Very difficult to judge her volume.  Holding diuresis again today.   CAD:  No evidence of active ischemia.   No change in therapy.    HTN:   BP well controlled.   Continue current therapy.    For questions or updates, please contact [facility] Please consult [email or web address] for contact info under Cardiology/STEMI.   Signed, Lynwood Schilling, MD  05/16/2024, 11:26 AM    "

## 2024-05-16 NOTE — Progress Notes (Signed)
 "                                                                                                                                                                                                                                                                                PROGRESS NOTE     Patient Demographics:    [name], is a 84 y.o. female, DOB - [birth year], FMW:[id number]  Outpatient Primary MD for the patient is [name], MD    LOS - 8  Admit date - 05/08/2024    Chief Complaint  Patient presents with   Shortness of Breath       Brief Narrative (HPI from H&P)    [name] is a 84 y.o. female with medical history significant of CHF, hypertension who presents emergency department with shortness of breath.  Patient is a poor historian however on arrival was found to be satting 65%.  Was placed on supplemental oxygen  with improvement.  She was endorsing a profound cough and found to be febrile.  She was recently seen by primary care doctor and given Levaquin  for presumed pneumonia.  On arrival labs were obtained which showed respiratory viral panel negative, potassium 5.2, glucose 300, WBC 12.2, hemoglobin 10.8, INR 1.1, BNP 1100, lactic acid 2.0.  Patient underwent CT chest which demonstrated pulmonary opacities.  Chest x-ray showed volume overload.  Patient was started on IV antibiotics admitted for further workup.  On evaluation patient had 3+ pitting edema and JVD.    Subjective:   Patient in bed, appears comfortable, denies any headache, no fever, no chest pain or pressure, improved shortness of breath , no abdominal pain. No focal weakness.   Assessment  & Plan :   Acute hypoxic respiratory failure due to combination of CAP and acute on chronic diastolic CHF EF 60% -   Failed outpatient Levaquin  treatment, antibiotics adjusted, main component appears to be of CHF, placed on IV Lasix  dose increased further on 05/11/2024, echo shows a EF of 60%, clinically improving with  diuresis, taper down antibiotics and continue diuresis with Lasix  IV dose increased Zaroxolyn  added on 05/13/2024.  Advance activity and monitor.  Encouraged to sit in chair  use I-S and flutter valve for pulmonary toiletry. For now continue antibiotics, Lasix , beta-blocker, ARB, Imdur  combination.    Despite aggressive diuresis she is still quite hypoxic with crackles and pulmonary edema on chest x-ray, diuretic dose increased on 05/13/2024, CTA done on 05/13/2024 suggest pulmonary hypertension, no PE but nonspecific lung parenchymal changes, seen by cardiology, underwent right heart catheterization on 05/15/2024 showed 1. Relatively high cardiac output but no evidence for left=>right shunting. 2. Optimized filling pressures. 3. Mild pulmonary hypertension.  PVR not significantly elevated given relatively high cardiac output. 4. Preserved PAPi.  Both cardiology pulmonary following, based on nonspecific CT findings question if some underlying chronic lung pathology.  Both pulmonary and cardiology following.  Case was discussed with both cardiology and pulmonary, mild improvement with diuresis however placed on IV steroids on 05/15/2023 after which there is some improvement in her hypoxia.  Continue to monitor, dose diuretics and steroids on a daily basis.   SpO2: 94 % O2 Flow Rate (L/min): (S) 5 L/min FiO2 (%): 32 %  # CAD history of bare metal stent placement in the proximal LAD, with subsequent cardiac catheterizations showing a patent stent, continue Plavix , beta-blocker and statin combination  # Generalized anxiety-continue Xanax    # Hypertension-continue amlodipine , Imdur , losartan , metoprolol    # Pancreatic insufficiency-continue Creon    # Chronic pain-continue gabapentin    # Ulcerative colitis-continue Lialda , budesonide , colestid   # Obesity with OSA.  BMI over 30.  Follow-up with PCP. BiPAP at bedtime.  # H/O Cor Pulmonale, ? ILD versus chronic inflammatory pulmonary disease - on 3 lits  o2 at home with night time Trilogy Vent - supportive Rx, CT and CTA noted no PE but have pulmonary hypertension on CTA, RV pressures appear stable on echo, lung parenchyma has some nonspecific changes, per son patient uses trilogy when at night and daytime 3 L nasal cannula oxygen  if she sleeps, usually does not use oxygen  24/7.  Pulmonary is following for now steroid challenge, nebulizer treatments, SLP follow-up and monitor.  # Vaginal discharge.  Seen by [facility] physician, stable vaginal swab, empiric Diflucan  given.  Much improved.      Condition - Guarded  Family Communication  : Called son Elsie [phone] - 05/10/2024 at 8:19 AM, called again on 05/12/2024 at 10:27 AM.  Mailbox full, updated again at 11.10 am., updated 05/13/24, 05/14/2024, updated 05/16/2024  Consults.  Cardiology, OB, pulmonary  Code Status : Full code      Procedures.    Right heart catheterization by Dr. Ezra Shuck on 05/15/2024.   1. Relatively high cardiac output but no evidence for left=>right shunting. 2. Optimized filling pressures. 3. Mild pulmonary hypertension.  PVR not significantly elevated given relatively high cardiac output. 4. Preserved PAPi  CTA chest 05/13/2024.   1. No evidence of pulmonary embolus. 2. 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 Recommend correlation with echocardiography and pulmonary hypertension workup, and cardiology/pulmonology consultation; consider follow-up CTA or MRA in 6-12 months to assess stability (sooner if symptoms). 3. Ground-glass opacities within the lungs, more pronounced on the right; differential includes edema, infection/inflammation (including viral/atypical), aspiration, or hemorrhage. Recommend clinical correlation and follow-up chest CT  in ~8-12 weeks to document resolution if clinically indicated. 4. Streaky dependent peribronchovascular opacities with bronchiectasis in the lower lobes bilaterally; differential includes chronic aspiration, postinfectious/postinflammatory change, or chronic small airways disease. Recommend  correlation for aspiration risk and pulmonary follow-up as clinically indicated. 5. Enlarged heart with moderate calcific coronary artery disease  CT scan chest.  1. Scattered patchy pulmonary opacities, right lower lobe predominant, favoring multifocal atelectasis; superimposed right lower lobe pneumonia is possible but not favored. 2. Cardiomegaly. No interstitial edema. 3. Pulmonary arterial hypertension  Echocardiogram.  1. EF significantly improved since TTE done 04/26/22 . Left ventricular ejection fraction, by estimation, is 60 to 65%. The left ventricle has normal function. The left ventricle has no regional wall motion abnormalities. Left ventricular diastolic parameters are consistent with Grade I diastolic dysfunction (impaired relaxation). The average left ventricular global longitudinal strain is -21.6 %. The global longitudinal strain is normal.  2. Right ventricular systolic function is normal. The right ventricular size is normal.  3. Left atrial size was mildly dilated.  4. Right atrial size was mildly dilated.  5. The mitral valve is normal in structure. No evidence of mitral valve regurgitation. No evidence of mitral stenosis.  6. The aortic valve is tricuspid. There is mild calcification of the aortic valve. Aortic valve regurgitation is trivial. Aortic valve sclerosis is present, with no evidence of aortic valve stenosis.  7. The inferior vena cava is normal in size with greater than 50% respiratory variability, suggesting right atrial pressure of 3 mmHg   Disposition Plan  :    Status is: Inpatient   DVT Prophylaxis  :    SCD's Start: 05/15/24 1124 enoxaparin  (LOVENOX ) injection 40 mg Start:  05/09/24 1000 SCDs Start: 05/08/24 2300   Lab Results  Component Value Date   PLT 588 (H) 05/16/2024    Diet :  Diet Order             Diet Carb Modified Room service appropriate? Yes  Diet effective now                    Inpatient Medications  Scheduled Meds:  amLODipine   10 mg Oral Daily   atorvastatin   20 mg Oral Daily   budesonide   9 mg Oral Daily   Chlorhexidine  Gluconate Cloth  6 each Topical Daily   clopidogrel   75 mg Oral Daily   colchicine   0.6 mg Oral BID   colestipol   2 g Oral BID   doxycycline   100 mg Oral Q12H   enoxaparin  (LOVENOX ) injection  40 mg Subcutaneous Daily   fluticasone  furoate-vilanterol  1 puff Inhalation Daily   gabapentin   300 mg Oral BID   insulin  aspart  0-15 Units Subcutaneous TID WC   insulin  aspart  0-5 Units Subcutaneous QHS   insulin  glargine  15 Units Subcutaneous Daily   isosorbide  mononitrate  30 mg Oral Daily   lipase/protease/amylase  24,000 Units Oral TID with meals   losartan   25 mg Oral Daily   mesalamine   2.4 g Oral BID   methylPREDNISolone  (SOLU-MEDROL ) injection  60 mg Intravenous Q24H   metoprolol  succinate  100 mg Oral Daily   morphine   15 mg Oral Q12H   nystatin    Topical BID   oxymetazoline   1 spray Each Nare BID   pantoprazole   40 mg Oral BID   sodium chloride  flush  3 mL Intravenous Q12H   tamsulosin   0.4 mg Oral QPC supper   Continuous Infusions:  sodium chloride      magnesium  sulfate bolus IVPB      PRN Meds:.sodium chloride , acetaminophen  **OR** acetaminophen , ALPRAZolam , alum & mag hydroxide-simeth, HYDROcodone  bit-homatropine, ipratropium-albuterol , ondansetron  **OR** ondansetron  (ZOFRAN ) IV, sodium chloride , sodium  chloride flush  Antibiotics  :    Anti-infectives (From admission, onward)    Start     Dose/Rate Route Frequency Ordered Stop   05/11/24 1130  doxycycline  (VIBRA -TABS) tablet 100 mg        100 mg Oral Every 12 hours 05/11/24 1034     05/10/24 2330  vancomycin  (VANCOREADY) IVPB  1500 mg/300 mL  Status:  Discontinued       Placed in Followed by Linked Group   1,500 mg 150 mL/hr over 120 Minutes Intravenous Every 48 hours 05/08/24 2316 05/11/24 1034   05/10/24 1200  fluconazole  (DIFLUCAN ) tablet 150 mg        150 mg Oral  Once 05/10/24 1109 05/10/24 1259   05/10/24 1145  fluconazole  (DIFLUCAN ) tablet 100 mg  Status:  Discontinued        100 mg Oral Daily 05/10/24 1047 05/10/24 1215   05/09/24 0600  ceFEPIme  (MAXIPIME ) 2 g in sodium chloride  0.9 % 100 mL IVPB  Status:  Discontinued        2 g 200 mL/hr over 30 Minutes Intravenous Every 8 hours 05/08/24 2316 05/11/24 1034   05/08/24 2330  vancomycin  (VANCOREADY) IVPB 1750 mg/350 mL       Placed in Followed by Linked Group   1,750 mg 175 mL/hr over 120 Minutes Intravenous  Once 05/08/24 2316 05/09/24 0255   05/08/24 2130  doxycycline  (VIBRAMYCIN ) 100 mg in sodium chloride  0.9 % 250 mL IVPB  Status:  Discontinued        100 mg 125 mL/hr over 120 Minutes Intravenous  Once 05/08/24 2119 05/08/24 2137   05/08/24 2000  cefTRIAXone  (ROCEPHIN ) 2 g in sodium chloride  0.9 % 100 mL IVPB        2 g 200 mL/hr over 30 Minutes Intravenous Once 05/08/24 1956 05/08/24 2131   05/08/24 2000  azithromycin  (ZITHROMAX ) 500 mg in sodium chloride  0.9 % 250 mL IVPB  Status:  Discontinued        500 mg 250 mL/hr over 60 Minutes Intravenous  Once 05/08/24 1956 05/08/24 2119         Objective:   Vitals:   05/16/24 0400 05/16/24 0442 05/16/24 0443 05/16/24 0700  BP: 117/63 117/67  125/61  Pulse: (!) 57 66    Resp: 13 20    Temp:  98 F (36.7 C)  98.2 F (36.8 C)  TempSrc: Oral Oral  Oral  SpO2: 97% 94%    Weight:   93.5 kg     Wt Readings from Last 3 Encounters:  05/16/24 93.5 kg  02/27/24 85 kg  12/17/23 95.5 kg     Intake/Output Summary (Last 24 hours) at 05/16/2024 0850 Last data filed at 05/16/2024 0442 Gross per 24 hour  Intake 253 ml  Output 1100 ml  Net -847 ml     Physical Exam  Awake Alert, No new F.N  deficits, Normal affect [place].AT,PERRAL Symmetrical Chest wall movement, Good air movement bilaterally, bibasilar crackles RRR,No Gallops,Rubs or new Murmurs,  +ve B.Sounds, Abd Soft, No tenderness,   No Cyanosis, Clubbing or edema      Data Review:    Recent Labs  Lab 05/12/24 0339 05/13/24 0331 05/14/24 0332 05/15/24 0428 05/15/24 0935 05/16/24 0503  WBC 8.8 9.3 10.1 11.1*  --  12.1*  HGB 9.5* 9.7* 10.1* 9.8* 10.9* 9.8*  HCT 29.4* 29.8* 31.5* 30.1* 32.0* 30.4*  PLT 480* 535* 630* 628*  --  588*  MCV 90.2 90.0 89.7 88.8  --  89.4  MCH 29.1 29.3 28.8 28.9  --  28.8  MCHC 32.3 32.6 32.1 32.6  --  32.2  RDW 15.8* 15.7* 15.9* 15.4  --  15.5  LYMPHSABS 1.1 1.7 1.8 1.1  --  1.2  MONOABS 0.8 0.9 0.9 0.5  --  0.6  EOSABS 0.2 0.2 0.2 0.0  --  0.0  BASOSABS 0.0 0.0 0.0 0.0  --  0.0    Recent Labs  Lab 05/10/24 0934 05/11/24 0346 05/12/24 0339 05/13/24 0331 05/14/24 0332 05/15/24 0428 05/15/24 0935 05/16/24 0503  NA 135 135 135 136 136 132* 138 131*  K 3.7 4.1 3.3* 4.1 3.7 3.7 3.4* 3.7  CL 99 100 99 100 98 97*  --  96*  CO2 24 24 27 28 27 25   --  22  ANIONGAP 13 11 10 8 11 11   --  13  GLUCOSE 221* 219* 238* 144* 150* 259*  --  340*  BUN 14 17 15 17 21  39*  --  42*  CREATININE 0.75 0.72 0.66 0.69 0.94 1.07*  --  0.90  CRP 8.7* 5.5* 2.9* 1.4* 0.8  --   --   --   PROCALCITON 0.10 <0.10 <0.10 <0.10 <0.10 <0.10  --   --   HGBA1C  --   --   --   --  11.5*  --   --   --   MG  --  1.7 1.7 1.9 1.7  --   --  1.6*  CALCIUM  7.9* 7.9* 8.1* 8.6* 9.0 8.7*  --  8.6*      Recent Labs  Lab 05/10/24 0934 05/11/24 0346 05/12/24 0339 05/13/24 0331 05/14/24 0332 05/15/24 0428 05/16/24 0503  CRP 8.7* 5.5* 2.9* 1.4* 0.8  --   --   PROCALCITON 0.10 <0.10 <0.10 <0.10 <0.10 <0.10  --   HGBA1C  --   --   --   --  11.5*  --   --   MG  --  1.7 1.7 1.9 1.7  --  1.6*  CALCIUM  7.9* 7.9* 8.1* 8.6* 9.0 8.7* 8.6*     --------------------------------------------------------------------------------------------------------------- Lab Results  Component Value Date   CHOL 146 06/16/2021   HDL 58.60 06/16/2021   LDLCALC 66 06/16/2021   TRIG 104.0 06/16/2021   CHOLHDL 2 06/16/2021    Lab Results  Component Value Date   HGBA1C 11.5 (H) 05/14/2024   No results for input(s): TSH, T4TOTAL, FREET4, T3FREE, THYROIDAB in the last 72 hours. No results for input(s): VITAMINB12, FOLATE, FERRITIN, TIBC, IRON , RETICCTPCT in the last 72 hours. ------------------------------------------------------------------------------------------------------------------ Cardiac Enzymes No results for input(s): CKMB, TROPONINI, MYOGLOBIN in the last 168 hours.  Invalid input(s): CK  Micro Results Recent Results (from the past 240 hours)  Resp panel by RT-PCR (RSV, Flu A&B, Covid) Anterior Nasal Swab     Status: None   Collection Time: 05/08/24  7:56 PM   Specimen: Anterior Nasal Swab  Result Value Ref Range Status   SARS Coronavirus 2 by RT PCR NEGATIVE NEGATIVE Final   Influenza A by PCR NEGATIVE NEGATIVE Final   Influenza B by PCR NEGATIVE NEGATIVE Final    Comment: (NOTE) The Xpert Xpress SARS-CoV-2/FLU/RSV plus assay is intended as an aid in the diagnosis of influenza from Nasopharyngeal swab specimens and should not be used as a sole basis for treatment. Nasal washings and aspirates are unacceptable for Xpert Xpress SARS-CoV-2/FLU/RSV testing.  Fact Sheet for Patients: bloggercourse.com  Fact Sheet for Healthcare Providers: seriousbroker.it  This test is  not yet approved or cleared by the [place]  FDA and has been authorized for detection and/or diagnosis of SARS-CoV-2 by FDA under an Emergency Use Authorization (EUA). This EUA will remain in effect (meaning this test can be used) for the duration of the COVID-19  declaration under Section 564(b)(1) of the Act, 21 U.S.C. section 360bbb-3(b)(1), unless the authorization is terminated or revoked.     Resp Syncytial Virus by PCR NEGATIVE NEGATIVE Final    Comment: (NOTE) Fact Sheet for Patients: bloggercourse.com  Fact Sheet for Healthcare Providers: seriousbroker.it  This test is not yet approved or cleared by the [place]  FDA and has been authorized for detection and/or diagnosis of SARS-CoV-2 by FDA under an Emergency Use Authorization (EUA). This EUA will remain in effect (meaning this test can be used) for the duration of the COVID-19 declaration under Section 564(b)(1) of the Act, 21 U.S.C. section 360bbb-3(b)(1), unless the authorization is terminated or revoked.  Performed at [facility], [address], [place], [place] [address]   Blood Culture (routine x 2)     Status: None   Collection Time: 05/08/24  8:16 PM   Specimen: BLOOD RIGHT ARM  Result Value Ref Range Status   Specimen Description BLOOD RIGHT ARM  Final   Special Requests   Final    BOTTLES DRAWN AEROBIC AND ANAEROBIC Blood Culture results may not be optimal due to an inadequate volume of blood received in culture bottles   Culture   Final    NO GROWTH 5 DAYS Performed at [facility], [address] [place], [place] [address]    Report Status 05/13/2024 FINAL  Final  Blood Culture (routine x 2)     Status: None   Collection Time: 05/08/24  8:46 PM   Specimen: BLOOD RIGHT ARM  Result Value Ref Range Status   Specimen Description BLOOD RIGHT ARM  Final   Special Requests   Final    BOTTLES DRAWN AEROBIC AND ANAEROBIC Blood Culture results may not be optimal due to an inadequate volume of blood received in culture bottles   Culture   Final    NO GROWTH 5 DAYS Performed at [facility], [address] [place], [place] [address]    Report Status 05/13/2024 FINAL  Final  Urine Culture      Status: Abnormal   Collection Time: 05/08/24 10:58 PM   Specimen: Urine, Random  Result Value Ref Range Status   Specimen Description URINE, RANDOM  Final   Special Requests   Final    NONE Reflexed from [id number] Performed at [facility], [address] [place], [place] [address]    Culture (A)  Final    20,000 COLONIES/mL MULTIPLE SPECIES PRESENT, SUGGEST RECOLLECTION   Report Status 05/10/2024 FINAL  Final  MRSA Next Gen by PCR, Nasal     Status: None   Collection Time: 05/10/24  5:24 AM   Specimen: Nasal Mucosa; Nasal Swab  Result Value Ref Range Status   MRSA by PCR Next Gen NOT DETECTED NOT DETECTED Final    Comment: (NOTE) The GeneXpert MRSA Assay (FDA approved for NASAL specimens only), is one component of a comprehensive MRSA colonization surveillance program. It is not intended to diagnose MRSA infection nor to guide or monitor treatment for MRSA infections. Test performance is not FDA approved in patients less than 23 years old. Performed at [facility], [address], [place], [place] [address]  Radiology Report DG Chest Port 1 View Result Date: 05/16/2024 EXAM: 1 VIEW XRAY OF THE CHEST 05/16/2024 07:05:00 AM COMPARISON: Portable chest 05/13/2024. CLINICAL HISTORY: SOB (shortness of breath). FINDINGS: LUNGS AND PLEURA: There is streaky atelectasis or infiltrate in the left lower lobe, linear right mid perihilar atelectasis. The lungs are otherwise clear with stable overall aeration. No pleural effusion. No pneumothorax. HEART AND MEDIASTINUM: The heart is enlarged. Perihilar vascular congestion continues to be seen. Interstitial edema is no longer identified. The aorta is tortuous and calcified with a stable mediastinum. BONES AND SOFT TISSUES: No acute osseous abnormality. IMPRESSION: 1. Enlarged heart with persistent perihilar vascular congestion and no appreciable interstitial edema. 2. Streaky atelectasis or infiltrate in the left lower lobe and linear  right mid perihilar atelectasis. Electronically signed by: Francis Quam MD 05/16/2024 07:37 AM EST RP Workstation: [id number]   CARDIAC CATHETERIZATION Result Date: 05/15/2024 1. Relatively high cardiac output but no evidence for left=>right shunting. 2. Optimized filling pressures. 3. Mild pulmonary hypertension.  PVR not significantly elevated given relatively high cardiac output. 4. Preserved PAPi.   ECHOCARDIOGRAM LIMITED Result Date: 05/14/2024    ECHOCARDIOGRAM LIMITED REPORT   Patient Name:   Dafna Eisner Date of Exam: 05/14/2024 Medical Rec #:  [id number]     Height:       60.0 in Accession #:    [id number]    Weight:       194.9 lb Date of Birth:  [birth year]     BSA:          1.846 m Patient Age:    83 years      BP:           132/72 mmHg Patient Gender: F             HR:           81 bpm. Exam Location:  Inpatient Procedure: Saline Contrast Bubble Study and Limited Echo Indications:    Dyspnea  History:        Patient has prior history of Echocardiogram examinations, most                 recent 05/09/2024. CHF, CAD, Signs/Symptoms:Dyspnea and Syncope;                 Risk Factors:Hypertension, Diabetes, Sleep Apnea and Former                 Smoker.  Sonographer:    Juliene Rucks Referring Phys: [phone] MAUDE JAYSON EMMER  Sonographer Comments: Patient is obese and Technically difficult study due to poor echo windows. IMPRESSIONS  1. Limited microcavitation study; suboptimal; no obvious shunt but cannot fully exclude with this study. FINDINGS  Left Ventricle: IAS/Shunts: Agitated saline contrast was given intravenously to evaluate for intracardiac shunting. Additional Comments: Limited microcavitation study; suboptimal; no obvious shunt but cannot fully exclude with this study.  Redell Shallow MD Electronically signed by Redell Shallow MD Signature Date/Time: 05/14/2024/12:31:32 PM    Final       Signature  -   Lavada Stank DO on 05/16/2024 at 8:50 AM   -  To page go to [email or web address]   "

## 2024-05-16 NOTE — Plan of Care (Signed)
  Problem: Clinical Measurements: Goal: Ability to maintain clinical measurements within normal limits will improve Outcome: Progressing Goal: Will remain free from infection Outcome: Progressing Goal: Respiratory complications will improve Outcome: Progressing   Problem: Activity: Goal: Risk for activity intolerance will decrease Outcome: Progressing   Problem: Safety: Goal: Ability to remain free from injury will improve Outcome: Progressing

## 2024-05-16 NOTE — Progress Notes (Signed)
 "  NAME:  [name], MRN:  [id number], DOB:  June 05, 1940, LOS: 8 ADMISSION DATE:  05/08/2024, CONSULTATION DATE:  05/14/24 REFERRING MD:  Lavada Stank, MD CHIEF COMPLAINT:  Hypoxemia   History of Present Illness:  84 year old female with CAD s/p BMS to pLAD, chronic diastolic heart failure, cor pulmonale, ?ILD, HTN, ulcerative colitis, GAD, pancreatic insufficiency, chronic pain, OSA on Trilogy vent on nocturnal 3L who presented on 05/08/24 for shortness of breath and found to be hypoxemic in the ED. PTA treated for pneumonia by PCP with levaquin . RVP negative. Labs pertinent for WBC 12.2 BNP 1100 LA 2.0. CXR with vascular congestion and small right pleural effusion. CT Chest 05/08/24 scattered patchy pulmonary opacities with lower lobe bronchiectasis. During the hospital course she has continued to receive diuresis for volume overload.  She continued to require 6L O2 via [place] and wearing BiPAP on 1/14 but has not worn prior during this hospitalization.  CTA obtained 1/14 was negative for PE but had ground glass opacities R>L, lower lobe bronchiectasis, no edema.  PCCM consulted for recommendations. She is followed by [facility] and on triolgy vent with 3L nightly.  PFT 08/11/21 FVC 1.14 (65%) FEV1 0.87 (65%) Ratio 77   Interpretation: No obstructive defect. Suggestive of restriction   Pertinent  Medical History  As above  Significant Hospital Events: Including procedures, antibiotic start and stop dates in addition to other pertinent events   1/15 PCCM consult, trial of steroids started and Breo added 1/16 Right heart cath with mild pulm HTN and no evidence of shunting, PCWP mean 11  1/17 oxygen  saturations down to 4 L nasal cannula. Net negative 7.8L for admit thus far   Interim History / Subjective:  Seen lying in bed with no acute complaints other than hyperglycemia in the setting of steroid use  Objective    Blood pressure 117/67, pulse 66, temperature 98 F (36.7 C), temperature  source Oral, resp. rate 20, weight 93.5 kg, SpO2 94%.    FiO2 (%):  [32 %] 32 % Pressure Support:  [6 cmH20] 6 cmH20   Intake/Output Summary (Last 24 hours) at 05/16/2024 0716 Last data filed at 05/16/2024 0442 Gross per 24 hour  Intake 253 ml  Output 1100 ml  Net -847 ml   Filed Weights   05/13/24 1738 05/15/24 0500 05/16/24 0443  Weight: 88.4 kg 97.3 kg 93.5 kg   Physical Exam: General: Acute on chronic ill-appearing elderly female lying in bed in NAD HEENT: [place]/AT, MM pink/moist, PERRL,  Neuro: Alert and oriented x3, flat affect  CV: s1s2 regular rate and rhythm, no murmur, rubs, or gallops,  PULM:  Slightly diminished, no increased work of breathing, on 4 L [place] GI: soft, bowel sounds active in all 4 quadrants, non-tender, non-distended, tolerating oral diet Extremities: warm/dry, no edema  Skin: no rashes or lesions  Resolved problem list   Assessment and Plan  Acute on chronic hypoxemic respiratory failure  Hx cor pulmonale Asthma -Baseline nocturnal use, now on 6L while inpatient despite aggressive diuresis since admission -Patient reports no oxygen  at home except while wearing her Trilogy at home however family reports daytime use. CT chest reviewed and chronic lung disease including basilar bronchiectasis likely related to post-inflammatory/infectious changes and ground glass opacities throughout that suggest air trapping. Ground glass opacities could represent an interstitial process such as ILD however unclear if her hypoxemia is being driven by an ILD-related exacerbation at this time. Bubble study negative.  -RHC with mild pulm HTN with  no evidence of shunting  P: Wean supplemental oxygen  ad able for sat goal > 88% Continue steroids  Scheduled Breo  Mobilize as able  Encourage pulmonary hygiene  Daily assessment for need to diurese   Pulmonary continue to follow  Critical care time: N/A   Jonesha Tsuchiya D. Harris, NP-C [facility] Personal  contact information can be found on Amion  If no contact or response made please call [phone] 05/16/2024, 7:20 AM           "

## 2024-05-17 ENCOUNTER — Inpatient Hospital Stay (HOSPITAL_COMMUNITY)

## 2024-05-17 DIAGNOSIS — I2510 Atherosclerotic heart disease of native coronary artery without angina pectoris: Secondary | ICD-10-CM | POA: Diagnosis not present

## 2024-05-17 DIAGNOSIS — I272 Pulmonary hypertension, unspecified: Secondary | ICD-10-CM | POA: Diagnosis not present

## 2024-05-17 DIAGNOSIS — J159 Unspecified bacterial pneumonia: Secondary | ICD-10-CM | POA: Diagnosis not present

## 2024-05-17 LAB — GLUCOSE, CAPILLARY
Glucose-Capillary: 271 mg/dL — ABNORMAL HIGH (ref 70–99)
Glucose-Capillary: 220 mg/dL — ABNORMAL HIGH (ref 70–99)
Glucose-Capillary: 258 mg/dL — ABNORMAL HIGH (ref 70–99)
Glucose-Capillary: 203 mg/dL — ABNORMAL HIGH (ref 70–99)

## 2024-05-17 LAB — BASIC METABOLIC PANEL WITH GFR
Anion gap: 11 (ref 5–15)
BUN: 45 mg/dL — ABNORMAL HIGH (ref 8–23)
CO2: 25 mmol/L (ref 22–32)
Calcium: 8.4 mg/dL — ABNORMAL LOW (ref 8.9–10.3)
Chloride: 97 mmol/L — ABNORMAL LOW (ref 98–111)
Creatinine, Ser: 0.81 mg/dL (ref 0.44–1.00)
GFR, Estimated: >60 mL/min
Glucose, Bld: 317 mg/dL — ABNORMAL HIGH (ref 70–99)
Potassium: 3.2 mmol/L — ABNORMAL LOW (ref 3.5–5.1)
Sodium: 133 mmol/L — ABNORMAL LOW (ref 135–145)

## 2024-05-17 LAB — MAGNESIUM: Magnesium: 2.3 mg/dL (ref 1.7–2.4)

## 2024-05-17 MED ORDER — POTASSIUM CHLORIDE CRYS ER 20 MEQ PO TBCR
40.0000 meq | EXTENDED_RELEASE_TABLET | Freq: Two times a day (BID) | ORAL | Status: AC
  Administered 2024-05-17 (×2): 40 meq via ORAL
  Filled 2024-05-17 (×2): qty 2

## 2024-05-17 MED ORDER — METHYLPREDNISOLONE SODIUM SUCC 40 MG IJ SOLR: 40.0000 mg | INTRAMUSCULAR | Status: DC

## 2024-05-17 MED ORDER — INSULIN GLARGINE 100 UNIT/ML ~~LOC~~ SOLN
20.0000 [IU] | Freq: Every day | SUBCUTANEOUS | Status: DC
  Administered 2024-05-17 – 2024-05-18 (×2): 20 [IU] via SUBCUTANEOUS
  Filled 2024-05-17 (×2): qty 0.2

## 2024-05-17 MED ORDER — METHYLPREDNISOLONE SODIUM SUCC 40 MG IJ SOLR
20.0000 mg | INTRAMUSCULAR | Status: DC
  Administered 2024-05-17 – 2024-05-18 (×2): 20 mg via INTRAVENOUS
  Filled 2024-05-17 (×2): qty 1

## 2024-05-17 MED ORDER — INSULIN ASPART 100 UNIT/ML IJ SOLN
4.0000 [IU] | Freq: Three times a day (TID) | INTRAMUSCULAR | Status: DC
  Administered 2024-05-17 – 2024-05-18 (×4): 4 [IU] via SUBCUTANEOUS
  Filled 2024-05-17 (×4): qty 1

## 2024-05-17 NOTE — Progress Notes (Signed)
 "   Progress Note  Patient Name: [name] Date of Encounter: 05/17/2024  Primary Cardiologist:   Oneil Parchment, MD   Subjective   She says she walked to the bathroom this morning.  She does not think she was in any distress with her breathing.  She is not having any chest pain.  No PND  Inpatient Medications    Scheduled Meds:  amLODipine   10 mg Oral Daily   atorvastatin   20 mg Oral Daily   budesonide   9 mg Oral Daily   Chlorhexidine  Gluconate Cloth  6 each Topical Daily   clopidogrel   75 mg Oral Daily   colchicine   0.6 mg Oral BID   colestipol   2 g Oral BID   doxycycline   100 mg Oral Q12H   enoxaparin  (LOVENOX ) injection  40 mg Subcutaneous Daily   fluticasone  furoate-vilanterol  1 puff Inhalation Daily   gabapentin   300 mg Oral BID   insulin  aspart  0-15 Units Subcutaneous TID WC   insulin  aspart  0-5 Units Subcutaneous QHS   insulin  aspart  4 Units Subcutaneous TID WC   insulin  glargine  20 Units Subcutaneous Daily   isosorbide  mononitrate  30 mg Oral Daily   lipase/protease/amylase  24,000 Units Oral TID with meals   losartan   25 mg Oral Daily   mesalamine   2.4 g Oral BID   methylPREDNISolone  (SOLU-MEDROL ) injection  20 mg Intravenous Q24H   metoprolol  succinate  100 mg Oral Daily   morphine   15 mg Oral Q12H   nystatin    Topical BID   pantoprazole   40 mg Oral BID   potassium chloride   40 mEq Oral BID   sodium chloride  flush  3 mL Intravenous Q12H   tamsulosin   0.4 mg Oral QPC supper   Continuous Infusions:   PRN Meds: acetaminophen  **OR** acetaminophen , ALPRAZolam , alum & mag hydroxide-simeth, HYDROcodone  bit-homatropine, ipratropium-albuterol , ondansetron  **OR** ondansetron  (ZOFRAN ) IV, sodium chloride , sodium chloride  flush   Vital Signs    Vitals:   05/17/24 0500 05/17/24 0802 05/17/24 0810 05/17/24 0955  BP:  125/60  125/60  Pulse:  (!) 59  62  Resp:  15    Temp:  97.8 F (36.6 C)    TempSrc:  Oral    SpO2:  93% 93%   Weight: 95.3 kg        Intake/Output Summary (Last 24 hours) at 05/17/2024 1128 Last data filed at 05/17/2024 1004 Gross per 24 hour  Intake 5 ml  Output 1425 ml  Net -1420 ml   Filed Weights   05/15/24 0500 05/16/24 0443 05/17/24 0500  Weight: 97.3 kg 93.5 kg 95.3 kg    Telemetry    NSR - Personally Reviewed  ECG    NA - Personally Reviewed  Physical Exam   GEN: No  acute distress.   Neck: No  JVD Cardiac: RRR, no murmurs, rubs, or gallops.  Respiratory:     Bilateral basilar crackles.  GI: Soft, nontender, non-distended, normal bowel sounds  MS:  No edema; No deformity. Neuro:   Nonfocal  Psych: Oriented and appropriate    Labs    Chemistry Recent Labs  Lab 05/15/24 0428 05/15/24 0935 05/16/24 0503 05/17/24 0557  NA 132* 138 131* 133*  K 3.7 3.4* 3.7 3.2*  CL 97*  --  96* 97*  CO2 25  --  22 25  GLUCOSE 259*  --  340* 317*  BUN 39*  --  42* 45*  CREATININE 1.07*  --  0.90 0.81  CALCIUM  8.7*  --  8.6* 8.4*  GFRNONAA 52*  --  >60 >60  ANIONGAP 11  --  13 11     Hematology Recent Labs  Lab 05/14/24 0332 05/15/24 0428 05/15/24 0935 05/16/24 0503  WBC 10.1 11.1*  --  12.1*  RBC 3.51* 3.39*  --  3.40*  HGB 10.1* 9.8* 10.9* 9.8*  HCT 31.5* 30.1* 32.0* 30.4*  MCV 89.7 88.8  --  89.4  MCH 28.8 28.9  --  28.8  MCHC 32.1 32.6  --  32.2  RDW 15.9* 15.4  --  15.5  PLT 630* 628*  --  588*    Cardiac EnzymesNo results for input(s): TROPONINI in the last 168 hours. No results for input(s): TROPIPOC in the last 168 hours.   BNP Recent Labs  Lab 05/13/24 0331  PROBNP 258.0     DDimer No results for input(s): DDIMER in the last 168 hours.   Radiology    DG Chest Port 1 View Result Date: 05/16/2024 EXAM: 1 VIEW XRAY OF THE CHEST 05/16/2024 07:05:00 AM COMPARISON: Portable chest 05/13/2024. CLINICAL HISTORY: SOB (shortness of breath). FINDINGS: LUNGS AND PLEURA: There is streaky atelectasis or infiltrate in the left lower lobe, linear right mid perihilar  atelectasis. The lungs are otherwise clear with stable overall aeration. No pleural effusion. No pneumothorax. HEART AND MEDIASTINUM: The heart is enlarged. Perihilar vascular congestion continues to be seen. Interstitial edema is no longer identified. The aorta is tortuous and calcified with a stable mediastinum. BONES AND SOFT TISSUES: No acute osseous abnormality. IMPRESSION: 1. Enlarged heart with persistent perihilar vascular congestion and no appreciable interstitial edema. 2. Streaky atelectasis or infiltrate in the left lower lobe and linear right mid perihilar atelectasis. Electronically signed by: Francis Quam MD 05/16/2024 07:37 AM EST RP Workstation: [id number]    Cardiac Studies   Cardiac cath  Right Heart Pressures RHC Procedural Findings: Hemodynamics (mmHg) RA mean 5 RV 51/12 PA 46/20, mean 29 PCWP mean 11  Oxygen  saturations: SVC 70% RA 70% RV 68% PA 66% AO 93%  Cardiac Output (Fick) 8.33  Cardiac Index (Fick) 4.34  PVR 2.5 WU  Cardiac Output (Thermo) 6.22  Cardiac Index (Thermo) 3.24  PVR 2.9 WU  PAPi 5.2    Patient Profile     84 y.o. female  with a hx of CAD s/p BMS to LAD 2002 with residual disease 2023 managed medically, chronic HFimpEF, chronic edema, pulmonary HTN, chronic hypoxic and hypercapnic respiratory failure, prolonged QTc, HTN, HLD, DM, UC, morbid obesity, fibromyalgia, prior angiomyolipoma versus RCC, diverticulosis, pancreatitis, aspirin  intolerance who is being seen 05/13/2024 for the evaluation of CHF at the request of Dr. Dennise.   Assessment & Plan    Hypoxic respiratory failure:  Right heart cath with mild pulm HTN and no evidence of shunting.   She has had good diuresis.   PCWP mean 11.  Given this we have backed off on diuresis and she is being managed with solumedrol.    Very difficult to judge her volume.  Held diuretics yesterday.   Hold again today.  We will follow as needed.   CAD:  No evidence of active ischemia.   No change in  therapy.    HTN:   BP well controlled.   Continue current therapy.    For questions or updates, please contact [facility] Please consult [email or web address] for contact info under Cardiology/STEMI.   Signed, Lynwood Schilling, MD  05/17/2024, 11:28 AM    "

## 2024-05-17 NOTE — Plan of Care (Incomplete)
  Problem: Pain Managment: Goal: General experience of comfort will improve and/or be controlled Outcome: Progressing

## 2024-05-17 NOTE — Progress Notes (Addendum)
" °   05/17/24 2155  BiPAP/CPAP/SIPAP  Mask Type Full face mask  Mask Size (S)  Medium  Patient Home Machine No  Patient Home Mask No  Patient Home Tubing No  Auto Titrate Yes    Pt placed on CPAP with 3l bled in. VSS. "

## 2024-05-17 NOTE — Progress Notes (Signed)
 "                                                                                                                                                                                                                                                                                PROGRESS NOTE     Patient Demographics:    [name], is a 84 y.o. female, DOB - 1940-10-30, FMW:[id number]  Outpatient Primary MD for the patient is [name], MD    LOS - 9  Admit date - 05/08/2024    Chief Complaint  Patient presents with   Shortness of Breath       Brief Narrative (HPI from H&P)    [name] is a 84 y.o. female with medical history significant of CHF, hypertension who presents emergency department with shortness of breath.  Patient is a poor historian however on arrival was found to be satting 65%.  Was placed on supplemental oxygen  with improvement.  She was endorsing a profound cough and found to be febrile.  She was recently seen by primary care doctor and given Levaquin  for presumed pneumonia.  On arrival labs were obtained which showed respiratory viral panel negative, potassium 5.2, glucose 300, WBC 12.2, hemoglobin 10.8, INR 1.1, BNP 1100, lactic acid 2.0.  Patient underwent CT chest which demonstrated pulmonary opacities.  Chest x-ray showed volume overload.  Patient was started on IV antibiotics admitted for further workup.  On evaluation patient had 3+ pitting edema and JVD.    Subjective:   Patient in bed, appears comfortable, denies any headache, no fever, no chest pain or pressure, no shortness of breath , no abdominal pain. No focal weakness.   Assessment  & Plan :   Acute hypoxic respiratory failure due to combination of CAP and acute on chronic diastolic CHF EF 60% -   Failed outpatient Levaquin  treatment, antibiotics adjusted, main component appears to be of CHF, placed on IV Lasix  dose increased further on 05/11/2024, echo shows a EF of 60%, clinically improving with  diuresis, taper down antibiotics and continue diuresis with Lasix  IV dose increased Zaroxolyn  added on 05/13/2024.  Advance activity and monitor.  Encouraged to sit in chair  use I-S and flutter valve for pulmonary toiletry. For now continue antibiotics, Lasix , beta-blocker, ARB, Imdur  combination.    Despite aggressive diuresis she is still quite hypoxic with crackles and pulmonary edema on chest x-ray, diuretic dose increased on 05/13/2024, CTA done on 05/13/2024 suggest pulmonary hypertension, no PE but nonspecific lung parenchymal changes, seen by cardiology, underwent right heart catheterization on 05/15/2024 showed 1. Relatively high cardiac output but no evidence for left > right shunting. 2. Optimized filling pressures. 3. Mild pulmonary hypertension.  PVR not significantly elevated given relatively high cardiac output. 4. Preserved PAPi.  Both cardiology pulmonary following, based on nonspecific CT findings question if some underlying chronic lung pathology.  Both pulmonary and cardiology following.  Case was discussed with both cardiology and pulmonary, mild improvement with diuresis however placed on IV steroids on 05/15/2023 after which there is some improvement in her hypoxia.  Continue to monitor, dose diuretics and steroids which were started on 05/14/2024 on a daily basis.   SpO2: 93 % O2 Flow Rate (L/min): 3 L/min FiO2 (%): 50 %  # CAD history of bare metal stent placement in the proximal LAD, with subsequent cardiac catheterizations showing a patent stent, continue Plavix , beta-blocker and statin combination  # Generalized anxiety-continue Xanax    # Hypertension-continue amlodipine , Imdur , losartan , metoprolol    # Pancreatic insufficiency-continue Creon    # Chronic pain-continue gabapentin    # Ulcerative colitis-continue Lialda , budesonide , colestid   # Obesity with OSA.  BMI over 30.  Follow-up with PCP. BiPAP at bedtime.  # H/O Cor Pulmonale, ? ILD versus chronic inflammatory  pulmonary disease - on 3 lits o2 at home with night time Trilogy Vent - supportive Rx, CT and CTA noted no PE but have pulmonary hypertension on CTA, RV pressures appear stable on echo, lung parenchyma has some nonspecific changes, per son patient uses trilogy when at night and daytime 3 L nasal cannula oxygen  if she sleeps, usually does not use oxygen  24/7.  SLP cleared the patient, after she was started on steroid challenge on 05/14/2024 hypoxia has significantly improved.  Start tapering steroids.  # Vaginal discharge.  [facility] by [facility], stable vaginal swab, empiric Diflucan  given.  Much improved.  # DM type II.  In poor control due to high-dose steroids, insulin  dose adjusted further on 05/17/2024 for better control, steroids being tapered.  Monitor closely  Lab Results  Component Value Date   HGBA1C 11.5 (H) 05/14/2024   CBG (last 3)  Recent Labs    05/16/24 1603 05/16/24 2054 05/17/24 0806  GLUCAP 244* 335* 271*         Condition - Guarded  Family Communication  : Called son Elsie [phone] - 05/10/2024 at 8:19 AM, called again on 05/12/2024 at 10:27 AM.  Mailbox full, updated again at 11.10 am., updated 05/13/24, 05/14/2024, updated 05/16/2024  Consults.  Cardiology, OB, pulmonary  Code Status : Full code      Procedures.    Right heart catheterization by Dr. Ezra Shuck on 05/15/2024.   1. Relatively high cardiac output but no evidence for left=>right shunting. 2. Optimized filling pressures. 3. Mild pulmonary hypertension.  PVR not significantly elevated given relatively high cardiac output. 4. Preserved PAPi  CTA chest 05/13/2024.   1. No evidence of pulmonary embolus. 2. 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 Recommend correlation  with echocardiography and  pulmonary hypertension workup, and cardiology/pulmonology consultation; consider follow-up CTA or MRA in 6-12 months to assess stability (sooner if symptoms). 3. Ground-glass opacities within the lungs, more pronounced on the right; differential includes edema, infection/inflammation (including viral/atypical), aspiration, or hemorrhage. Recommend clinical correlation and follow-up chest CT in ~8-12 weeks to document resolution if clinically indicated. 4. Streaky dependent peribronchovascular opacities with bronchiectasis in the lower lobes bilaterally; differential includes chronic aspiration, postinfectious/postinflammatory change, or chronic small airways disease. Recommend correlation for aspiration risk and pulmonary follow-up as clinically indicated. 5. Enlarged heart with moderate calcific coronary artery disease  CT scan chest.  1. Scattered patchy pulmonary opacities, right lower lobe predominant, favoring multifocal atelectasis; superimposed right lower lobe pneumonia is possible but not favored. 2. Cardiomegaly. No interstitial edema. 3. Pulmonary arterial hypertension  Echocardiogram.  1. EF significantly improved since TTE done 04/26/22 . Left ventricular ejection fraction, by estimation, is 60 to 65%. The left ventricle has normal function. The left ventricle has no regional wall motion abnormalities. Left ventricular diastolic parameters are consistent with Grade I diastolic dysfunction (impaired relaxation). The average left ventricular global longitudinal strain is -21.6 %. The global longitudinal strain is normal.  2. Right ventricular systolic function is normal. The right ventricular size is normal.  3. Left atrial size was mildly dilated.  4. Right atrial size was mildly dilated.  5. The mitral valve is normal in structure. No evidence of mitral valve regurgitation. No evidence of mitral stenosis.  6. The aortic valve is tricuspid. There is mild calcification of the  aortic valve. Aortic valve regurgitation is trivial. Aortic valve sclerosis is present, with no evidence of aortic valve stenosis.  7. The inferior vena cava is normal in size with greater than 50% respiratory variability, suggesting right atrial pressure of 3 mmHg   Disposition Plan  :    Status is: Inpatient   DVT Prophylaxis  :    SCD's Start: 05/15/24 1124 enoxaparin  (LOVENOX ) injection 40 mg Start: 05/09/24 1000 SCDs Start: 05/08/24 2300   Lab Results  Component Value Date   PLT 588 (H) 05/16/2024    Diet :  Diet Order             Diet Carb Modified Room service appropriate? No  Diet effective now                    Inpatient Medications  Scheduled Meds:  amLODipine   10 mg Oral Daily   atorvastatin   20 mg Oral Daily   budesonide   9 mg Oral Daily   Chlorhexidine  Gluconate Cloth  6 each Topical Daily   clopidogrel   75 mg Oral Daily   colchicine   0.6 mg Oral BID   colestipol   2 g Oral BID   doxycycline   100 mg Oral Q12H   enoxaparin  (LOVENOX ) injection  40 mg Subcutaneous Daily   fluticasone  furoate-vilanterol  1 puff Inhalation Daily   gabapentin   300 mg Oral BID   insulin  aspart  0-15 Units Subcutaneous TID WC   insulin  aspart  0-5 Units Subcutaneous QHS   insulin  aspart  4 Units Subcutaneous TID WC   insulin  glargine  20 Units Subcutaneous Daily   isosorbide  mononitrate  30 mg Oral Daily   lipase/protease/amylase  24,000 Units Oral TID with meals   losartan   25 mg Oral Daily   mesalamine   2.4 g Oral BID   methylPREDNISolone  (SOLU-MEDROL ) injection  20 mg Intravenous Q24H   metoprolol  succinate  100 mg Oral Daily   morphine   15 mg Oral Q12H   nystatin    Topical BID   pantoprazole   40 mg Oral BID   potassium chloride   40 mEq Oral BID   sodium chloride  flush  3 mL Intravenous Q12H   tamsulosin   0.4 mg Oral QPC supper   Continuous Infusions:    PRN Meds:.acetaminophen  **OR** acetaminophen , ALPRAZolam , alum & mag hydroxide-simeth, HYDROcodone   bit-homatropine, ipratropium-albuterol , ondansetron  **OR** ondansetron  (ZOFRAN ) IV, sodium chloride , sodium chloride  flush  Antibiotics  :    Anti-infectives (From admission, onward)    Start     Dose/Rate Route Frequency Ordered Stop   05/11/24 1130  doxycycline  (VIBRA -TABS) tablet 100 mg        100 mg Oral Every 12 hours 05/11/24 1034     05/10/24 2330  vancomycin  (VANCOREADY) IVPB 1500 mg/300 mL  Status:  Discontinued       Placed in Followed by Linked Group   1,500 mg 150 mL/hr over 120 Minutes Intravenous Every 48 hours 05/08/24 2316 05/11/24 1034   05/10/24 1200  fluconazole  (DIFLUCAN ) tablet 150 mg        150 mg Oral  Once 05/10/24 1109 05/10/24 1259   05/10/24 1145  fluconazole  (DIFLUCAN ) tablet 100 mg  Status:  Discontinued        100 mg Oral Daily 05/10/24 1047 05/10/24 1215   05/09/24 0600  ceFEPIme  (MAXIPIME ) 2 g in sodium chloride  0.9 % 100 mL IVPB  Status:  Discontinued        2 g 200 mL/hr over 30 Minutes Intravenous Every 8 hours 05/08/24 2316 05/11/24 1034   05/08/24 2330  vancomycin  (VANCOREADY) IVPB 1750 mg/350 mL       Placed in Followed by Linked Group   1,750 mg 175 mL/hr over 120 Minutes Intravenous  Once 05/08/24 2316 05/09/24 0255   05/08/24 2130  doxycycline  (VIBRAMYCIN ) 100 mg in sodium chloride  0.9 % 250 mL IVPB  Status:  Discontinued        100 mg 125 mL/hr over 120 Minutes Intravenous  Once 05/08/24 2119 05/08/24 2137   05/08/24 2000  cefTRIAXone  (ROCEPHIN ) 2 g in sodium chloride  0.9 % 100 mL IVPB        2 g 200 mL/hr over 30 Minutes Intravenous Once 05/08/24 1956 05/08/24 2131   05/08/24 2000  azithromycin  (ZITHROMAX ) 500 mg in sodium chloride  0.9 % 250 mL IVPB  Status:  Discontinued        500 mg 250 mL/hr over 60 Minutes Intravenous  Once 05/08/24 1956 05/08/24 2119         Objective:   Vitals:   05/16/24 2235 05/17/24 0355 05/17/24 0500 05/17/24 0802  BP: 113/62 (!) 142/89  125/60  Pulse:    (!) 59  Resp:    15  Temp: (!) 97.4 F  (36.3 C) 97.6 F (36.4 C)  97.8 F (36.6 C)  TempSrc: Oral Oral  Oral  SpO2:    93%  Weight:   95.3 kg     Wt Readings from Last 3 Encounters:  05/17/24 95.3 kg  02/27/24 85 kg  12/17/23 95.5 kg     Intake/Output Summary (Last 24 hours) at 05/17/2024 0837 Last data filed at 05/17/2024 0425 Gross per 24 hour  Intake --  Output 1425 ml  Net -1425 ml     Physical Exam  Awake Alert, No new F.N deficits, Normal affect [place].AT,PERRAL Symmetrical Chest wall movement, Good air movement bilaterally, bibasilar crackles RRR,No Gallops,Rubs or new Murmurs,  +ve B.Sounds, Abd Soft, No tenderness,  No Cyanosis, Clubbing or edema      Data Review:    Recent Labs  Lab 05/12/24 0339 05/13/24 0331 05/14/24 0332 05/15/24 0428 05/15/24 0935 05/16/24 0503  WBC 8.8 9.3 10.1 11.1*  --  12.1*  HGB 9.5* 9.7* 10.1* 9.8* 10.9* 9.8*  HCT 29.4* 29.8* 31.5* 30.1* 32.0* 30.4*  PLT 480* 535* 630* 628*  --  588*  MCV 90.2 90.0 89.7 88.8  --  89.4  MCH 29.1 29.3 28.8 28.9  --  28.8  MCHC 32.3 32.6 32.1 32.6  --  32.2  RDW 15.8* 15.7* 15.9* 15.4  --  15.5  LYMPHSABS 1.1 1.7 1.8 1.1  --  1.2  MONOABS 0.8 0.9 0.9 0.5  --  0.6  EOSABS 0.2 0.2 0.2 0.0  --  0.0  BASOSABS 0.0 0.0 0.0 0.0  --  0.0    Recent Labs  Lab 05/10/24 0934 05/10/24 0934 05/11/24 0346 05/12/24 0339 05/13/24 0331 05/14/24 0332 05/15/24 0428 05/15/24 0935 05/16/24 0503 05/17/24 0557  NA 135  --  135 135 136 136 132* 138 131* 133*  K 3.7  --  4.1 3.3* 4.1 3.7 3.7 3.4* 3.7 3.2*  CL 99  --  100 99 100 98 97*  --  96* 97*  CO2 24  --  24 27 28 27 25   --  22 25  ANIONGAP 13  --  11 10 8 11 11   --  13 11  GLUCOSE 221*  --  219* 238* 144* 150* 259*  --  340* 317*  BUN 14  --  17 15 17 21  39*  --  42* 45*  CREATININE 0.75  --  0.72 0.66 0.69 0.94 1.07*  --  0.90 0.81  CRP 8.7*  --  5.5* 2.9* 1.4* 0.8  --   --   --   --   PROCALCITON 0.10  --  <0.10 <0.10 <0.10 <0.10 <0.10  --   --   --   HGBA1C  --   --   --   --    --  11.5*  --   --   --   --   MG  --    < > 1.7 1.7 1.9 1.7  --   --  1.6* 2.3  CALCIUM  7.9*  --  7.9* 8.1* 8.6* 9.0 8.7*  --  8.6* 8.4*   < > = values in this interval not displayed.      Recent Labs  Lab 05/10/24 0934 05/10/24 0934 05/11/24 0346 05/12/24 0339 05/13/24 0331 05/14/24 0332 05/15/24 0428 05/16/24 0503 05/17/24 0557  CRP 8.7*  --  5.5* 2.9* 1.4* 0.8  --   --   --   PROCALCITON 0.10  --  <0.10 <0.10 <0.10 <0.10 <0.10  --   --   HGBA1C  --   --   --   --   --  11.5*  --   --   --   MG  --    < > 1.7 1.7 1.9 1.7  --  1.6* 2.3  CALCIUM  7.9*  --  7.9* 8.1* 8.6* 9.0 8.7* 8.6* 8.4*   < > = values in this interval not displayed.    --------------------------------------------------------------------------------------------------------------- Lab Results  Component Value Date   CHOL 146 06/16/2021   HDL 58.60 06/16/2021   LDLCALC 66 06/16/2021   TRIG 104.0 06/16/2021   CHOLHDL 2 06/16/2021    Lab Results  Component Value Date   HGBA1C 11.5 (H)  05/14/2024   No results for input(s): TSH, T4TOTAL, FREET4, T3FREE, THYROIDAB in the last 72 hours. No results for input(s): VITAMINB12, FOLATE, FERRITIN, TIBC, IRON , RETICCTPCT in the last 72 hours. ------------------------------------------------------------------------------------------------------------------ Cardiac Enzymes No results for input(s): CKMB, TROPONINI, MYOGLOBIN in the last 168 hours.  Invalid input(s): CK  Micro Results Recent Results (from the past 240 hours)  Resp panel by RT-PCR (RSV, Flu A&B, Covid) Anterior Nasal Swab     Status: None   Collection Time: 05/08/24  7:56 PM   Specimen: Anterior Nasal Swab  Result Value Ref Range Status   SARS Coronavirus 2 by RT PCR NEGATIVE NEGATIVE Final   Influenza A by PCR NEGATIVE NEGATIVE Final   Influenza B by PCR NEGATIVE NEGATIVE Final    Comment: (NOTE) The Xpert Xpress SARS-CoV-2/FLU/RSV plus assay is intended as an  aid in the diagnosis of influenza from Nasopharyngeal swab specimens and should not be used as a sole basis for treatment. Nasal washings and aspirates are unacceptable for Xpert Xpress SARS-CoV-2/FLU/RSV testing.  Fact Sheet for Patients: bloggercourse.com  Fact Sheet for Healthcare Providers: seriousbroker.it  This test is not yet approved or cleared by the [place]  FDA and has been authorized for detection and/or diagnosis of SARS-CoV-2 by FDA under an Emergency Use Authorization (EUA). This EUA will remain in effect (meaning this test can be used) for the duration of the COVID-19 declaration under Section 564(b)(1) of the Act, 21 U.S.C. section 360bbb-3(b)(1), unless the authorization is terminated or revoked.     Resp Syncytial Virus by PCR NEGATIVE NEGATIVE Final    Comment: (NOTE) Fact Sheet for Patients: bloggercourse.com  Fact Sheet for Healthcare Providers: seriousbroker.it  This test is not yet approved or cleared by the [place]  FDA and has been authorized for detection and/or diagnosis of SARS-CoV-2 by FDA under an Emergency Use Authorization (EUA). This EUA will remain in effect (meaning this test can be used) for the duration of the COVID-19 declaration under Section 564(b)(1) of the Act, 21 U.S.C. section 360bbb-3(b)(1), unless the authorization is terminated or revoked.  Performed at [facility], [address], [place], [place] [address]   Blood Culture (routine x 2)     Status: None   Collection Time: 05/08/24  8:16 PM   Specimen: BLOOD RIGHT ARM  Result Value Ref Range Status   Specimen Description BLOOD RIGHT ARM  Final   Special Requests   Final    BOTTLES DRAWN AEROBIC AND ANAEROBIC Blood Culture results may not be optimal due to an inadequate volume of blood received in culture bottles   Culture   Final    NO GROWTH 5  DAYS Performed at [facility], [address], [place], [place] [address]    Report Status 05/13/2024 FINAL  Final  Blood Culture (routine x 2)     Status: None   Collection Time: 05/08/24  8:46 PM   Specimen: BLOOD RIGHT ARM  Result Value Ref Range Status   Specimen Description BLOOD RIGHT ARM  Final   Special Requests   Final    BOTTLES DRAWN AEROBIC AND ANAEROBIC Blood Culture results may not be optimal due to an inadequate volume of blood received in culture bottles   Culture   Final    NO GROWTH 5 DAYS Performed at [facility], [address], [place], [place] [address]    Report Status 05/13/2024 FINAL  Final  Urine Culture     Status: Abnormal   Collection  Time: 05/08/24 10:58 PM   Specimen: Urine, Random  Result Value Ref Range Status   Specimen Description URINE, RANDOM  Final   Special Requests   Final    NONE Reflexed from [id number] Performed at [facility], [address], [place], [place] [address]    Culture (A)  Final    20,000 COLONIES/mL MULTIPLE SPECIES PRESENT, SUGGEST RECOLLECTION   Report Status 05/10/2024 FINAL  Final  MRSA Next Gen by PCR, Nasal     Status: None   Collection Time: 05/10/24  5:24 AM   Specimen: Nasal Mucosa; Nasal Swab  Result Value Ref Range Status   MRSA by PCR Next Gen NOT DETECTED NOT DETECTED Final    Comment: (NOTE) The GeneXpert MRSA Assay (FDA approved for NASAL specimens only), is one component of a comprehensive MRSA colonization surveillance program. It is not intended to diagnose MRSA infection nor to guide or monitor treatment for MRSA infections. Test performance is not FDA approved in patients less than 8 years old. Performed at [facility], [address] [place], [place] [address]     Radiology Report DG Chest Port 1 View Result Date: 05/16/2024 EXAM: 1 VIEW XRAY OF THE CHEST 05/16/2024 07:05:00 AM COMPARISON: Portable chest 05/13/2024. CLINICAL HISTORY: SOB (shortness of breath).  FINDINGS: LUNGS AND PLEURA: There is streaky atelectasis or infiltrate in the left lower lobe, linear right mid perihilar atelectasis. The lungs are otherwise clear with stable overall aeration. No pleural effusion. No pneumothorax. HEART AND MEDIASTINUM: The heart is enlarged. Perihilar vascular congestion continues to be seen. Interstitial edema is no longer identified. The aorta is tortuous and calcified with a stable mediastinum. BONES AND SOFT TISSUES: No acute osseous abnormality. IMPRESSION: 1. Enlarged heart with persistent perihilar vascular congestion and no appreciable interstitial edema. 2. Streaky atelectasis or infiltrate in the left lower lobe and linear right mid perihilar atelectasis. Electronically signed by: Francis Quam MD 05/16/2024 07:37 AM EST RP Workstation: [id number]   CARDIAC CATHETERIZATION Result Date: 05/15/2024 1. Relatively high cardiac output but no evidence for left=>right shunting. 2. Optimized filling pressures. 3. Mild pulmonary hypertension.  PVR not significantly elevated given relatively high cardiac output. 4. Preserved PAPi.      Signature  -   Lavada Stank DO on 05/17/2024 at 8:37 AM   -  To page go to [email or web address]   "

## 2024-05-18 ENCOUNTER — Other Ambulatory Visit (HOSPITAL_COMMUNITY): Payer: Self-pay

## 2024-05-18 DIAGNOSIS — I5030 Unspecified diastolic (congestive) heart failure: Secondary | ICD-10-CM

## 2024-05-18 DIAGNOSIS — J159 Unspecified bacterial pneumonia: Secondary | ICD-10-CM | POA: Diagnosis not present

## 2024-05-18 LAB — POCT I-STAT EG7
Acid-Base Excess: 2 mmol/L (ref 0.0–2.0)
Acid-Base Excess: 1 mmol/L (ref 0.0–2.0)
Acid-Base Excess: 2 mmol/L (ref 0.0–2.0)
Bicarbonate: 27.5 mmol/L (ref 20.0–28.0)
Bicarbonate: 27 mmol/L (ref 20.0–28.0)
Bicarbonate: 27.8 mmol/L (ref 20.0–28.0)
Calcium, Ion: 1.19 mmol/L (ref 1.15–1.40)
Calcium, Ion: 1.18 mmol/L (ref 1.15–1.40)
Calcium, Ion: 1.19 mmol/L (ref 1.15–1.40)
HCT: 32 % — ABNORMAL LOW (ref 36.0–46.0)
HCT: 31 % — ABNORMAL LOW (ref 36.0–46.0)
HCT: 32 % — ABNORMAL LOW (ref 36.0–46.0)
Hemoglobin: 10.9 g/dL — ABNORMAL LOW (ref 12.0–15.0)
Hemoglobin: 10.5 g/dL — ABNORMAL LOW (ref 12.0–15.0)
Hemoglobin: 10.9 g/dL — ABNORMAL LOW (ref 12.0–15.0)
O2 Saturation: 66 %
O2 Saturation: 68 %
O2 Saturation: 70 %
Potassium: 3.4 mmol/L — ABNORMAL LOW (ref 3.5–5.1)
Potassium: 3.4 mmol/L — ABNORMAL LOW (ref 3.5–5.1)
Potassium: 3.5 mmol/L (ref 3.5–5.1)
Sodium: 137 mmol/L (ref 135–145)
Sodium: 137 mmol/L (ref 135–145)
Sodium: 138 mmol/L (ref 135–145)
TCO2: 29 mmol/L (ref 22–32)
TCO2: 28 mmol/L (ref 22–32)
TCO2: 29 mmol/L (ref 22–32)
pCO2, Ven: 45.5 mmHg (ref 44–60)
pCO2, Ven: 46.2 mmHg (ref 44–60)
pCO2, Ven: 46.8 mmHg (ref 44–60)
pH, Ven: 7.389 (ref 7.25–7.43)
pH, Ven: 7.374 (ref 7.25–7.43)
pH, Ven: 7.382 (ref 7.25–7.43)
pO2, Ven: 35 mmHg (ref 32–45)
pO2, Ven: 37 mmHg (ref 32–45)
pO2, Ven: 38 mmHg (ref 32–45)

## 2024-05-18 LAB — CBC WITH DIFFERENTIAL/PLATELET
Abs Immature Granulocytes: 0.19 K/uL — ABNORMAL HIGH (ref 0.00–0.07)
Basophils Absolute: 0 K/uL (ref 0.0–0.1)
Basophils Relative: 0 %
Eosinophils Absolute: 0.1 K/uL (ref 0.0–0.5)
Eosinophils Relative: 0 %
HCT: 31.2 % — ABNORMAL LOW (ref 36.0–46.0)
Hemoglobin: 10.4 g/dL — ABNORMAL LOW (ref 12.0–15.0)
Immature Granulocytes: 2 %
Lymphocytes Relative: 17 %
Lymphs Abs: 2.1 K/uL (ref 0.7–4.0)
MCH: 29.1 pg (ref 26.0–34.0)
MCHC: 33.3 g/dL (ref 30.0–36.0)
MCV: 87.4 fL (ref 80.0–100.0)
Monocytes Absolute: 1.1 K/uL — ABNORMAL HIGH (ref 0.1–1.0)
Monocytes Relative: 9 %
Neutro Abs: 8.9 K/uL — ABNORMAL HIGH (ref 1.7–7.7)
Neutrophils Relative %: 72 %
Platelets: 686 K/uL — ABNORMAL HIGH (ref 150–400)
RBC: 3.57 MIL/uL — ABNORMAL LOW (ref 3.87–5.11)
RDW: 15.3 % (ref 11.5–15.5)
WBC: 12.3 K/uL — ABNORMAL HIGH (ref 4.0–10.5)
nRBC: 0 % (ref 0.0–0.2)

## 2024-05-18 LAB — BASIC METABOLIC PANEL WITH GFR
Anion gap: 10 (ref 5–15)
BUN: 35 mg/dL — ABNORMAL HIGH (ref 8–23)
CO2: 25 mmol/L (ref 22–32)
Calcium: 8.5 mg/dL — ABNORMAL LOW (ref 8.9–10.3)
Chloride: 102 mmol/L (ref 98–111)
Creatinine, Ser: 0.62 mg/dL (ref 0.44–1.00)
GFR, Estimated: >60 mL/min
Glucose, Bld: 184 mg/dL — ABNORMAL HIGH (ref 70–99)
Potassium: 3.9 mmol/L (ref 3.5–5.1)
Sodium: 136 mmol/L (ref 135–145)

## 2024-05-18 LAB — LIPOPROTEIN A (LPA): Lipoprotein (a): 51.5 nmol/L — ABNORMAL HIGH

## 2024-05-18 LAB — MAGNESIUM: Magnesium: 2.1 mg/dL (ref 1.7–2.4)

## 2024-05-18 LAB — GLUCOSE, CAPILLARY: Glucose-Capillary: 116 mg/dL — ABNORMAL HIGH (ref 70–99)

## 2024-05-18 MED ORDER — METHYLPREDNISOLONE 4 MG PO TBPK
ORAL_TABLET | ORAL | 0 refills | Status: DC
  Filled 2024-05-18: qty 21, 6d supply, fill #0

## 2024-05-18 MED ORDER — INSULIN ASPART 100 UNIT/ML FLEXPEN
PEN_INJECTOR | SUBCUTANEOUS | 0 refills | Status: DC
  Filled 2024-05-18: qty 15, fill #0

## 2024-05-18 MED ORDER — INSULIN ASPART 100 UNIT/ML FLEXPEN: PEN_INJECTOR | SUBCUTANEOUS | Status: AC

## 2024-05-18 MED ORDER — INSULIN ASPART 100 UNIT/ML FLEXPEN
PEN_INJECTOR | SUBCUTANEOUS | 0 refills | Status: DC
  Filled 2024-05-18: qty 15, 35d supply, fill #0

## 2024-05-18 MED ORDER — TAMSULOSIN HCL 0.4 MG PO CAPS
0.4000 mg | ORAL_CAPSULE | Freq: Every day | ORAL | 0 refills | Status: AC
  Filled 2024-05-18: qty 30, 30d supply, fill #0

## 2024-05-18 MED ORDER — INSULIN GLARGINE 100 UNIT/ML SOLOSTAR PEN
15.0000 [IU] | PEN_INJECTOR | Freq: Two times a day (BID) | SUBCUTANEOUS | 0 refills | Status: DC
  Filled 2024-05-18: qty 15, 50d supply, fill #0

## 2024-05-18 MED ORDER — INSULIN PEN NEEDLE 32G X 4 MM MISC
0 refills | Status: DC
  Filled 2024-05-18: qty 100, 30d supply, fill #0

## 2024-05-18 NOTE — Discharge Instructions (Signed)
 Follow with Primary MD [name] LABOR, MD and your lung doctor in 7 days   Get CBC, CMP, Magnesium , 2 view Chest X ray -  checked next visit with your primary MD    Activity: As tolerated with Full fall precautions use walker/cane & assistance as needed  Disposition Home   Diet: Heart Healthy - Low Carb  Accuchecks 4 times/day, Once in AM empty stomach and then before each meal. Log in all results and show them to your Prim.MD in 3 days. If any glucose reading is under 80 or above 300 call your Prim MD immidiately. Follow Low glucose instructions for glucose under 80 as instructed.   Special Instructions: If you have smoked or chewed Tobacco  in the last 2 yrs please stop smoking, stop any regular Alcohol   and or any Recreational drug use.  On your next visit with your primary care physician please Get Medicines reviewed and adjusted.  Please request your Prim.MD to go over all Hospital Tests and Procedure/Radiological results at the follow up, please get all Hospital records sent to your Prim MD by signing hospital release before you go home.  If you experience worsening of your admission symptoms, develop shortness of breath, life threatening emergency, suicidal or homicidal thoughts you must seek medical attention immediately by calling 911 or calling your MD immediately  if symptoms less severe.  You Must read complete instructions/literature along with all the possible adverse reactions/side effects for all the Medicines you take and that have been prescribed to you. Take any new Medicines after you have completely understood and accpet all the possible adverse reactions/side effects.   Do not drive when taking Pain medications.  Do not take more than prescribed Pain, Sleep and Anxiety Medications  Wear Seat belts while driving.

## 2024-05-18 NOTE — Discharge Summary (Signed)
 "                                                                                                                                                                               Discharge summary note.  [name] FMW:[id number] DOB: 1940-12-13 DOA: 05/08/2024  PCP: [name], MD  Admit date: 05/08/2024  Discharge date: 05/18/2024  Admitted From: Home   Disposition:  Home   Recommendations for Outpatient Follow-up:   Follow up with PCP in 1-2 weeks  PCP Please obtain BMP/CBC, 2 view CXR in 1week,  (see Discharge instructions)   PCP Please follow up on the following pending results: Kindly arrange for close outpatient pulmonary and cardiology follow-up in 7 to 10 days.   Home Health: PT, RN, aide, social work if qualifies Equipment/Devices: See below Consultations: Cardiology, pulmonary Discharge Condition: Stable    CODE STATUS: Full    Diet Recommendation: Heart Healthy Low Carb  Diet Order             Diet Carb Modified Room service appropriate? No  Diet effective now                    Chief Complaint  Patient presents with   Shortness of Breath     Brief history of present illness from the day of admission and additional interim summary    84 y.o. female with medical history significant of CHF, hypertension who presents emergency department with shortness of breath.  Patient is a poor historian however on arrival was found to be satting 65%.  Was placed on supplemental oxygen  with improvement.  She was endorsing a profound cough and found to be febrile.  She was recently seen by primary care doctor and given Levaquin  for presumed pneumonia.  On arrival labs were obtained which showed respiratory viral panel negative, potassium 5.2, glucose 300, WBC 12.2, hemoglobin 10.8, INR 1.1, BNP 1100, lactic acid 2.0.  Patient underwent CT chest which demonstrated pulmonary opacities.  Chest x-ray showed volume overload.  Patient was started on IV antibiotics admitted for  further workup.  On evaluation patient had 3+ pitting edema and JVD.                                                                    Hospital Course   Acute hypoxic respiratory failure due to combination of CAP and acute on chronic diastolic CHF EF 60% -    Failed  outpatient Levaquin  treatment, she was treated here with adjusted antibiotics, seen by pulmonary and cardiology.  She was aggressively diuresed, however the real change in her oxygen  requirements came after she was given some steroids I question if she had developed some postinfectious inflammation, despite aggressive diuresis and antibiotics she was still requiring 7 to 8 L of oxygen  for several days, once steroids were started her oxygen  requirement significantly came down she is down to 2 L, since she is diabetic have given her a short course of steroids only.  She is down to 20 mg of IV Solu-Medrol , down to 2 L nasal cannula oxygen , will taper off steroids quickly, have placed her on insulin  at home.  Will have her follow-up with PCP and pulmonary in the outpatient setting.  Note patient had an echocardiogram here along with a right heart catheterization suggesting pulmonary hypertension but no cardiac shunting, she also had a CTA results of which are below.  No PE.  She is currently symptom-free, plan discussed with patient's son she will be discharged on short course of oral steroid taper, 2 L nasal cannula oxygen  at daytime, at nighttime she uses trilogy vent which she will continue to use.  Follow-up with PCP, pulmonary and cardiology on a close basis postdischarge  antibiotics adjusted, main component appears to be of CHF, placed on IV Lasix  dose increased further on 05/11/2024, echo shows a EF of 60%, clinically improving with diuresis, taper down antibiotics and continue diuresis with Lasix  IV dose increased Zaroxolyn  added on 05/13/2024.  Advance activity and monitor.  Encouraged to sit in chair use I-S and flutter valve for  pulmonary toiletry. For now continue antibiotics, Lasix , beta-blocker, ARB, Imdur  combination.     Despite aggressive diuresis she is still quite hypoxic with crackles and pulmonary edema on chest x-ray, diuretic dose increased on 05/13/2024, CTA done on 05/13/2024 suggest pulmonary hypertension, no PE but nonspecific lung parenchymal changes, seen by cardiology, underwent right heart catheterization on 05/15/2024 showed 1. Relatively high cardiac output but no evidence for left > right shunting. 2. Optimized filling pressures. 3. Mild pulmonary hypertension.  PVR not significantly elevated given relatively high cardiac output. 4. Preserved PAPi.   Both cardiology pulmonary following, based on nonspecific CT findings question if some underlying chronic lung pathology.  Both pulmonary and cardiology following.   Case was discussed with both cardiology and pulmonary, mild improvement with diuresis however placed on IV steroids on 05/15/2023 after which there is some improvement in her hypoxia.  Continue to monitor, dose diuretics and steroids which were started on 05/14/2024 on a daily basis.    SpO2: 93 % O2 Flow Rate (L/min): 3 L/min FiO2 (%): 50 %   # CAD history of bare metal stent placement in the proximal LAD, with subsequent cardiac catheterizations showing a patent stent, continue Plavix , beta-blocker and statin combination   # Generalized anxiety-continue Xanax    # Hypertension-continue amlodipine , Imdur , losartan , metoprolol  home diuretic dose.   # Pancreatic insufficiency-continue Creon    # Chronic pain-continue gabapentin    # Ulcerative colitis-continue Lialda , budesonide , colestid    # Obesity with OSA.  BMI over 30.  Follow-up with PCP. BiPAP at bedtime.   # H/O Cor Pulmonale, ? ILD versus chronic inflammatory pulmonary disease - on 3 lits o2 at home with night time Trilogy Vent - supportive Rx, CT and CTA noted no PE but have pulmonary hypertension on CTA, RV pressures appear stable  on echo, lung parenchyma has some nonspecific changes, per son patient uses  trilogy when at night and daytime 3 L nasal cannula oxygen  if she sleeps, usually does not use oxygen  24/7.  SLP cleared the patient, after she was started on steroid challenge on 05/14/2024 hypoxia has significantly improved.  Start tapering steroids.  See #1 above.   # Vaginal discharge.  Seen by [facility] physician, stable vaginal swab, empiric Diflucan  given.  Much improved.   # DM type II.  In poor control as suggested by high A1c, placed on insulin , diabetic and insulin  education provided to patient and son.  PCP to monitor    Lab Results  Component Value Date   HGBA1C 11.5 (H) 05/14/2024   CBG (last 3)  Recent Labs    05/17/24 1536 05/17/24 2058 05/18/24 0808  GLUCAP 258* 203* 116*   ' Discharge diagnosis     Principal Problem:   Community acquired bacterial pneumonia Active Problems:   Coronary artery disease involving native coronary artery of native heart without angina pectoris   Diastolic congestive heart failure (HCC)   Pulmonary hypertension, unspecified (HCC)    Discharge instructions    Discharge Instructions     Discharge instructions   Complete by: As directed    Follow with Primary MD [name], MD and your lung doctor in 7 days   Get CBC, CMP, Magnesium , 2 view Chest X ray -  checked next visit with your primary MD    Activity: As tolerated with Full fall precautions use walker/cane & assistance as needed  Disposition Home   Diet: Heart Healthy - Low Carb  Accuchecks 4 times/day, Once in AM empty stomach and then before each meal. Log in all results and show them to your Prim.MD in 3 days. If any glucose reading is under 80 or above 300 call your Prim MD immidiately. Follow Low glucose instructions for glucose under 80 as instructed.   Special Instructions: If you have smoked or chewed Tobacco  in the last 2 yrs please stop smoking, stop any regular Alcohol   and or any  Recreational drug use.  On your next visit with your primary care physician please Get Medicines reviewed and adjusted.  Please request your Prim.MD to go over all Hospital Tests and Procedure/Radiological results at the follow up, please get all Hospital records sent to your Prim MD by signing hospital release before you go home.  If you experience worsening of your admission symptoms, develop shortness of breath, life threatening emergency, suicidal or homicidal thoughts you must seek medical attention immediately by calling 911 or calling your MD immediately  if symptoms less severe.  You Must read complete instructions/literature along with all the possible adverse reactions/side effects for all the Medicines you take and that have been prescribed to you. Take any new Medicines after you have completely understood and accpet all the possible adverse reactions/side effects.   Do not drive when taking Pain medications.  Do not take more than prescribed Pain, Sleep and Anxiety Medications  Wear Seat belts while driving.   For home use only DME oxygen    Complete by: As directed    Length of Need: 12 Months   Mode or (Route): Nasal cannula   Liters per Minute: 2   Frequency: Continuous (stationary and portable oxygen  unit needed)   Oxygen  conserving device: Yes   Oxygen  delivery system: Gas   Increase activity slowly   Complete by: As directed        Discharge Medications   Allergies as of 05/18/2024  Reactions   Doxycycline  Other (See Comments)   Unsteady gait   Aspirin  Nausea And Vomiting   Azithromycin  Other (See Comments)   Unknown reaction per family member messed her up   Caffeine Nausea And Vomiting   Ciprofloxacin Nausea And Vomiting   Tolerates levaquin    Codeine Nausea And Vomiting   Oxycodone  Other (See Comments)   Pt stated, upsets my stomach (11/19/17)   Sulfamethizole Other (See Comments)   Unknown reaction   Tramadol Hcl Other (See Comments)    REACTION: pt states spaced-out        Medication List     STOP taking these medications    glimepiride  1 MG tablet Commonly known as: AMARYL    HYDROcodone -acetaminophen  10-325 MG tablet Commonly known as: NORCO   hydrOXYzine  10 MG tablet Commonly known as: ATARAX    levofloxacin  500 MG tablet Commonly known as: LEVAQUIN    Lidoderm  5 % Generic drug: lidocaine        TAKE these medications    acetaminophen  500 MG tablet Commonly known as: TYLENOL  Take 500 mg by mouth every 6 (six) hours as needed for headache (pain).   Airsupra 90-80 MCG/ACT Aero Generic drug: Albuterol -Budesonide  2 puffs as needed Inhalation prn up to 12 puffs a day; Duration: 30 days   albuterol  108 (90 Base) MCG/ACT inhaler Commonly known as: Ventolin  HFA Inhale 2 puffs into the lungs every 4 (four) hours as needed for wheezing or shortness of breath.   albuterol  (2.5 MG/3ML) 0.083% nebulizer solution Commonly known as: PROVENTIL  Take 3 mLs (2.5 mg total) by nebulization every 4 (four) hours as needed for wheezing or shortness of breath.   ALPRAZolam  0.5 MG tablet Commonly known as: XANAX  TAKE 1 TABLET BY MOUTH THREE TIMES A DAY AS NEEDED   amLODipine  10 MG tablet Commonly known as: NORVASC  Take 1 tablet (10 mg total) by mouth daily.   atorvastatin  20 MG tablet Commonly known as: LIPITOR Take 1 tablet (20 mg total) by mouth daily.   azelastine 0.1 % nasal spray Commonly known as: ASTELIN Place 1 spray into both nostrils at bedtime.   budesonide  3 MG 24 hr capsule Commonly known as: ENTOCORT EC  Take 3 capsules (9 mg total) by mouth daily. Please take for one more month then discontinue.   ciclopirox  8 % solution Commonly known as: PENLAC  Apply topically at bedtime. Apply over nail. Apply daily over previous coat. Remove weekly with polish remover.   clopidogrel  75 MG tablet Commonly known as: PLAVIX  Take 1 tablet (75 mg total) by mouth daily.   colchicine  0.6 MG tablet Take  0.6 mg by mouth daily.   colestipol  1 g tablet Commonly known as: COLESTID  TAKE 2 TABLETS BY MOUTH 2 TIMES DAILY.   dicyclomine  10 MG capsule Commonly known as: BENTYL  TAKE 1 CAPSULE (10 MG TOTAL) BY MOUTH 3 (THREE) TIMES DAILY AS NEEDED FOR SPASMS.   diphenoxylate -atropine  2.5-0.025 MG tablet Commonly known as: LOMOTIL  Take 1 tablet by mouth in the morning and at bedtime.   Farxiga  10 MG Tabs tablet Generic drug: dapagliflozin  propanediol TAKE 1 TABLET BY MOUTH EVERY DAY   fluticasone  50 MCG/ACT nasal spray Commonly known as: FLONASE  PLACE 2 SPRAYS IN EACH NOSTRIL AT BEDTIME   gabapentin  300 MG capsule Commonly known as: NEURONTIN  TAKE 1 CAPSULE BY MOUTH TWICE A DAY   guaiFENesin 600 MG 12 hr tablet Commonly known as: MUCINEX Take 600 mg by mouth 2 (two) times daily as needed for to loosen phlegm or cough.   insulin  aspart  100 UNIT/ML FlexPen Commonly known as: NOVOLOG  Take 5 units extra once a day at lunchtime if blood sugars are above 175.   This might be needed while you are taking steroid taper.   This is in addition to your Lantus  and NovoLog  sliding scale.   insulin  aspart 100 UNIT/ML FlexPen Commonly known as: NOVOLOG  Before each meal 3 times a day, 140-199 - 2 units, 200-250 - 6 units, 251-299 - 8 units,  300-349 - 10 units,  350 or above 14 units.   insulin  glargine 100 UNIT/ML Solostar Pen Commonly known as: LANTUS  Inject 15 Units into the skin 2 (two) times daily.   Insulin  Pen Needle 32G X 4 MM Misc Please provide 1 month supply   isosorbide  mononitrate 30 MG 24 hr tablet Commonly known as: IMDUR  Take 1 tablet (30 mg total) by mouth daily.   Januvia  100 MG tablet Generic drug: sitaGLIPtin  TAKE 1 TABLET BY MOUTH EVERY DAY   levocetirizine 5 MG tablet Commonly known as: XYZAL  Take 5 mg by mouth every evening.   loperamide 2 MG capsule Commonly known as: IMODIUM Take 4-6 mg by mouth daily. Taking as needed only   losartan  25 MG tablet Commonly  known as: COZAAR  Take 1 tablet (25 mg total) by mouth daily.   Magnesium  400 MG Tabs Take 3 tablets by mouth daily.   mesalamine  1.2 g EC tablet Commonly known as: LIALDA  Take 2 tablets (2.4 g total) by mouth 2 (two) times daily.   methylPREDNISolone  4 MG Tbpk tablet Commonly known as: MEDROL  DOSEPAK follow package directions   metoprolol  succinate 100 MG 24 hr tablet Commonly known as: TOPROL -XL Take 1 tablet (100 mg total) by mouth daily. TAKE WITH OR IMMEDIATELY FOLLOWING A MEAL.   morphine  30 MG 12 hr tablet Commonly known as: MS CONTIN  Take 1 tablet (30 mg total) by mouth every 12 (twelve) hours.   nitroGLYCERIN  0.4 MG SL tablet Commonly known as: NITROSTAT  PLACE 1 TABLET UNDER THE TONGUE EVERY 5 MINUTES AS NEEDED FOR CHEST PAIN.   Olopatadine  HCl 0.7 % Soln Place 1 drop into both eyes daily as needed (itching/allergies).   OneTouch Delica Lancets 33G Misc 1 each by Does not apply route daily at 12 noon.   OneTouch Verio Flex System w/Device Kit 1 each by Does not apply route daily at 12 noon.   OneTouch Verio test strip Generic drug: glucose blood USE TO CHECK BLOOD GLUCOSE ONCE DAILY   pantoprazole  40 MG tablet Commonly known as: PROTONIX  Take 1 tablet (40 mg total) by mouth 2 (two) times daily.   potassium chloride  SA 20 MEQ tablet Commonly known as: KLOR-CON  M Take 1 tablet (20 mEq total) by mouth 2 (two) times daily.   promethazine  25 MG tablet Commonly known as: PHENERGAN  TAKE 0.5 TABLETS (12.5 MG TOTAL) BY MOUTH DAILY. AS NEEDED FOR NAUSEA/ VOMITING.   SYSTANE OP Place 1 drop into both eyes daily. For dry eyes   tamsulosin  0.4 MG Caps capsule Commonly known as: FLOMAX  Take 1 capsule (0.4 mg total) by mouth daily after supper.   tiZANidine  4 MG tablet Commonly known as: ZANAFLEX  Take 4 mg by mouth 2 (two) times daily.   torsemide  20 MG tablet Commonly known as: DEMADEX  Take 2 tablets (40 mg total) by mouth daily.   Vitamin D   (Ergocalciferol ) 1.25 MG (50000 UNIT) Caps capsule Commonly known as: DRISDOL Take 50,000 Units by mouth every 14 (fourteen) days.   Zenpep  20000-63000 units Cpep Generic drug: Pancrelipase  (Lip-Prot-Amyl)  TAKE 2 CAPSULES (40,000 UNITS) WITH MEALS AND 1 CAPSULE (20,000 UNITS) WITH A SNACK. Max daily dose of 8 capsules (160,000 units).               Durable Medical Equipment  (From admission, onward)           Start     Ordered   05/18/24 0000  For home use only DME oxygen        Question Answer Comment  Length of Need 12 Months   Mode or (Route) Nasal cannula   Liters per Minute 2   Frequency Continuous (stationary and portable oxygen  unit needed)   Oxygen  conserving device Yes   Oxygen  delivery system: Gas      05/18/24 0842             Contact information for follow-up providers     [name], MD. Schedule an appointment as soon as possible for a visit in 1 week(s).   Specialty: Family Medicine Contact information: [address] [place] [address] [phone]         [facility]. Schedule an appointment as soon as possible for a visit in 1 week(s).   Specialty: Pulmonology Contact information: [address] [place] [address] [place]  [address] [phone] Additional information: [address]  [address]  [place], [place] [address]        Delford Maude BROCKS, MD. Schedule an appointment as soon as possible for a visit in 1 week(s).   Specialty: Cardiology Contact information: [address] [place] [address] [phone]              Contact information for after-discharge care     Home Medical Care     [facility]) .   Service: Home Health Services Contact information: [address] [place]  [address] [phone]                     Major procedures and Radiology Reports - PLEASE review detailed  and final reports thoroughly  -        DG Chest Port 1 View Result Date: 05/17/2024 CLINICAL DATA:  Shortness of breath. EXAM: PORTABLE CHEST 1 VIEW COMPARISON:  Chest radiograph dated 05/16/2024. FINDINGS: There is cardiomegaly with vascular congestion. Bilateral lower lung field opacities may represent edema or infiltrate and progressed since the prior radiograph. No pneumothorax. Trace pleural effusions may be present. No acute osseous pathology. IMPRESSION: 1. Cardiomegaly with vascular congestion. 2. Bilateral lower lung field opacities may represent edema or infiltrate. Electronically Signed   By: Vanetta Chou [name]   On: 05/17/2024 16:27   DG Chest Port 1 View Result Date: 05/16/2024 EXAM: 1 VIEW XRAY OF THE CHEST 05/16/2024 07:05:00 AM COMPARISON: Portable chest 05/13/2024. CLINICAL HISTORY: SOB (shortness of breath). FINDINGS: LUNGS AND PLEURA: There is streaky atelectasis or infiltrate in the left lower lobe, linear right mid perihilar atelectasis. The lungs are otherwise clear with stable overall aeration. No pleural effusion. No pneumothorax. HEART AND MEDIASTINUM: The heart is enlarged. Perihilar vascular congestion continues to be seen. Interstitial edema is no longer identified. The aorta is tortuous and calcified with a stable mediastinum. BONES AND SOFT TISSUES: No acute osseous abnormality. IMPRESSION: 1. Enlarged heart with persistent perihilar vascular congestion and no appreciable interstitial edema. 2. Streaky atelectasis or infiltrate in the left lower lobe and linear right mid perihilar atelectasis. Electronically signed by: Francis  Chesser MD 05/16/2024 07:37 AM EST RP Workstation: [id number]   CARDIAC CATHETERIZATION Result Date: 05/15/2024 1. Relatively high cardiac output but no evidence for left=>right shunting. 2. Optimized filling pressures. 3. Mild pulmonary hypertension.  PVR not significantly elevated given relatively high cardiac output. 4. Preserved PAPi.    ECHOCARDIOGRAM LIMITED Result Date: 05/14/2024    ECHOCARDIOGRAM LIMITED REPORT   Patient Name:   [name] Date of Exam: 05/14/2024 Medical Rec #:  [id number]     Height:       60.0 in Accession #:    [id number]    Weight:       194.9 lb Date of Birth:  October 07, 1940     BSA:          1.846 m Patient Age:    83 years      BP:           132/72 mmHg Patient Gender: F             HR:           81 bpm. Exam Location:  Inpatient Procedure: Saline Contrast Bubble Study and Limited Echo Indications:    Dyspnea  History:        Patient has prior history of Echocardiogram examinations, most                 recent 05/09/2024. CHF, CAD, Signs/Symptoms:Dyspnea and Syncope;                 Risk Factors:Hypertension, Diabetes, Sleep Apnea and Former                 Smoker.  Sonographer:    Juliene Rucks Referring Phys: [phone] MAUDE JAYSON EMMER  Sonographer Comments: Patient is obese and Technically difficult study due to poor echo windows. IMPRESSIONS  1. Limited microcavitation study; suboptimal; no obvious shunt but cannot fully exclude with this study. FINDINGS  Left Ventricle: IAS/Shunts: Agitated saline contrast was given intravenously to evaluate for intracardiac shunting. Additional Comments: Limited microcavitation study; suboptimal; no obvious shunt but cannot fully exclude with this study.  Redell Shallow MD Electronically signed by Redell Shallow MD Signature Date/Time: 05/14/2024/12:31:32 PM    Final    CT Angio Chest Pulmonary Embolism (PE) W or WO Contrast Result Date: 05/13/2024 EXAM: CTA CHEST 05/13/2024 12:08:00 PM TECHNIQUE: CTA of the chest was performed after the administration of intravenous contrast. Multiplanar reformatted images are provided for review. MIP images are provided for review. Automated exposure control, iterative reconstruction, and/or weight based adjustment of the mA/kV was utilized to reduce the radiation dose to as low as reasonably achievable. 75 mL of iohexol  (OMNIPAQUE ) 350 MG/ML  injection was administered. COMPARISON: CT of the chest dated 05/08/2024. CLINICAL HISTORY: Pulmonary embolism (PE) suspected, low to intermediate prob, neg D-dimer. Pulmonary embolism is suspected with low to intermediate probability. D-dimer test is negative. FINDINGS: PULMONARY ARTERIES: Pulmonary arteries are adequately opacified for evaluation. No acute pulmonary embolus. 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 MEDIASTINUM: The heart is enlarged and there is moderate calcific coronary artery disease present. The thoracic aorta is normal in caliber, but demonstrates moderate calcific atheromatous disease. The descending thoracic aorta is mildly tortuous. The pericardium demonstrates no acute abnormality. LYMPH NODES: No mediastinal, hilar or axillary lymphadenopathy. LUNGS AND PLEURA: There are ground-glass opacities within the lungs, more pronounced on the right. There are also streaky peribronchovascular opacities with bronchiectasis present dependently within the lower  lobes bilaterally. No focal consolidation or pulmonary edema. No evidence of pleural effusion or pneumothorax. Based on these findings, the most likely differential diagnoses include chronic interstitial lung disease (e.g., non-specific interstitial pneumonia, usual interstitial pneumonia, hypersensitivity pneumonitis) or chronic infection (e.g., atypical mycobacteria). Appropriate follow-up guidelines include pulmonary function tests (PFTs), high-resolution CT (HRCT) of the chest for further characterization of interstitial lung disease, and consideration of consultation with a pulmonologist. UPPER ABDOMEN: Limited images of the upper abdomen are unremarkable. SOFT TISSUES AND BONES: No acute bone or soft tissue abnormality. IMPRESSION: 1. No evidence of pulmonary embolus. 2. Aneurysmal dilatation of the main pulmonary artery measuring up to approximately 5.5 cm; most  commonly associated with pulmonary arterial hypertension (primary or secondary), congenital left-to-right shunt, and less commonly vasculitis/connective tissue disease or idiopathic. Recommend correlation with echocardiography and pulmonary hypertension workup, and cardiology/pulmonology consultation; consider follow-up CTA or MRA in 6-12 months to assess stability (sooner if symptoms). 3. Ground-glass opacities within the lungs, more pronounced on the right; differential includes edema, infection/inflammation (including viral/atypical), aspiration, or hemorrhage. Recommend clinical correlation and follow-up chest CT in ~8-12 weeks to document resolution if clinically indicated. 4. Streaky dependent peribronchovascular opacities with bronchiectasis in the lower lobes bilaterally; differential includes chronic aspiration, postinfectious/postinflammatory change, or chronic small airways disease. Recommend correlation for aspiration risk and pulmonary follow-up as clinically indicated. 5. Enlarged heart with moderate calcific coronary artery disease. Electronically signed by: Evalene Coho MD 05/13/2024 12:53 PM EST RP Workstation: [id number]   DG Chest Port 1 View Result Date: 05/13/2024 EXAM: 1 VIEW(S) XRAY OF THE CHEST 05/13/2024 07:29:00 AM COMPARISON: 05/10/2024 CLINICAL HISTORY: The patient is experiencing shortness of breath. ICD10: 858119 - Shortness of breath. FINDINGS: LUNGS AND PLEURA: Mild pulmonary edema, slightly improved. Bibasilar opacities, slightly improved. Fluid in right minor fissure. No pneumothorax. HEART AND MEDIASTINUM: Heart size at upper limits of normal. Aortic atherosclerosis. Enlarged central pulmonary arteries. BONES AND SOFT TISSUES: No acute osseous abnormality. IMPRESSION: 1. Mild pulmonary edema, slightly improved. 2. Bibasilar opacities, slightly improved. 3. Enlarged central pulmonary arteries. Electronically signed by: Waddell Calk MD 05/13/2024 07:38 AM EST RP Workstation:  [id number]   DG Chest Port 1 View Result Date: 05/10/2024 EXAM: 1 VIEW XRAY OF THE CHEST 05/10/2024 06:32:31 AM COMPARISON: 05/08/2024 CLINICAL HISTORY: 84 year old female with shortness of breath. FINDINGS: LUNGS AND PLEURA: No significant effusions. Diffuse interstitial opacities with patchy airspace opacities. Band-like opacity in right mid lung. No pneumothorax. HEART AND MEDIASTINUM: Cardiomegaly. Aortic atherosclerotic calcifications. BONES AND SOFT TISSUES: No acute osseous abnormality. IMPRESSION: 1. Unresolved pulmonary edema with new atelectasis or trace pleural fluid along the right minor fissure. Electronically signed by: Helayne Hurst MD MD 05/10/2024 06:46 AM EST RP Workstation: [id number]   ECHOCARDIOGRAM COMPLETE Result Date: 05/09/2024    ECHOCARDIOGRAM REPORT   Patient Name:   [name] Date of Exam: 05/09/2024 Medical Rec #:  [id number]     Height:       60.0 in Accession #:    [id number]    Weight:       187.3 lb Date of Birth:  1940-11-14     BSA:          1.815 m Patient Age:    83 years      BP:           144/77 mmHg Patient Gender: F             HR:           85 bpm. Exam Location:  Inpatient Procedure: 2D Echo, Cardiac Doppler, Color Doppler and Strain Analysis (Both            Spectral and Color Flow Doppler were utilized during procedure). Indications:    CHF-Acute Systolic  History:        Patient has prior history of Echocardiogram examinations, most                 recent 04/26/2022. CHF, CAD, Signs/Symptoms:Syncope; Risk                 Factors:Hypertension, Diabetes, Dyslipidemia and Former Smoker.  Sonographer:    Logan Shove RDCS Referring Phys: [id number] [facility]  Sonographer Comments: Suboptimal parasternal window. IMPRESSIONS  1. EF significantly improved since TTE done 04/26/22 . Left ventricular ejection fraction, by estimation, is 60 to 65%. The left ventricle has normal function. The left ventricle has no regional wall motion abnormalities. Left ventricular diastolic  parameters are consistent with Grade I diastolic dysfunction (impaired relaxation). The average left ventricular global longitudinal strain is -21.6 %. The global longitudinal strain is normal.  2. Right ventricular systolic function is normal. The right ventricular size is normal.  3. Left atrial size was mildly dilated.  4. Right atrial size was mildly dilated.  5. The mitral valve is normal in structure. No evidence of mitral valve regurgitation. No evidence of mitral stenosis.  6. The aortic valve is tricuspid. There is mild calcification of the aortic valve. Aortic valve regurgitation is trivial. Aortic valve sclerosis is present, with no evidence of aortic valve stenosis.  7. The inferior vena cava is normal in size with greater than 50% respiratory variability, suggesting right atrial pressure of 3 mmHg. FINDINGS  Left Ventricle: EF significantly improved since TTE done 04/26/22. Left ventricular ejection fraction, by estimation, is 60 to 65%. The left ventricle has normal function. The left ventricle has no regional wall motion abnormalities. The average left ventricular global longitudinal strain is -21.6 %. Strain was performed and the global longitudinal strain is normal. The left ventricular internal cavity size was normal in size. There is no left ventricular hypertrophy. Left ventricular diastolic parameters are consistent with Grade I diastolic dysfunction (impaired relaxation). Right Ventricle: The right ventricular size is normal. No increase in right ventricular wall thickness. Right ventricular systolic function is normal. Left Atrium: Left atrial size was mildly dilated. Right Atrium: Right atrial size was mildly dilated. Pericardium: There is no evidence of pericardial effusion. Mitral Valve: The mitral valve is normal in structure. No evidence of mitral valve regurgitation. No evidence of mitral valve stenosis. Tricuspid Valve: The tricuspid valve is normal in structure. Tricuspid valve  regurgitation is mild . No evidence of tricuspid stenosis. Aortic Valve: The aortic valve is tricuspid. There is mild calcification of the aortic valve. Aortic valve regurgitation is trivial. Aortic valve sclerosis is present, with no evidence of aortic valve stenosis. Aortic valve peak gradient measures 5.3 mmHg. Pulmonic Valve: The pulmonic valve was normal in structure. Pulmonic valve regurgitation is mild. No evidence of pulmonic stenosis. Aorta: The aortic root is normal in size and structure. Venous: The inferior vena cava is normal in size with greater than 50% respiratory variability, suggesting right atrial pressure of 3 mmHg. IAS/Shunts: No atrial level shunt detected by color flow Doppler. Additional Comments: 3D was performed not requiring image post processing on an independent workstation and was normal.  LEFT VENTRICLE PLAX 2D LVIDd:         5.00 cm      Diastology  LVIDs:         2.30 cm      LV e' medial:    5.66 cm/s LV PW:         0.90 cm      LV E/e' medial:  13.7 LV IVS:        1.00 cm      LV e' lateral:   7.72 cm/s LVOT diam:     2.20 cm      LV E/e' lateral: 10.0 LVOT Area:     3.80 cm LV IVRT:       106 msec     2D Longitudinal Strain                             2D Strain GLS Avg:     -21.6 %  LV Volumes (MOD) LV vol d, MOD A2C: 118.0 ml LV vol d, MOD A4C: 94.6 ml LV vol s, MOD A2C: 41.8 ml LV vol s, MOD A4C: 29.3 ml LV SV MOD A2C:     76.2 ml LV SV MOD A4C:     94.6 ml LV SV MOD BP:      72.0 ml RIGHT VENTRICLE             IVC RV Basal diam:  4.50 cm     IVC diam: 1.20 cm RV Mid diam:    3.20 cm RV S prime:     12.30 cm/s TAPSE (M-mode): 2.7 cm LEFT ATRIUM             Index        RIGHT ATRIUM           Index LA diam:        3.80 cm 2.09 cm/m   RA Area:     26.70 cm LA Vol (A2C):   84.6 ml 46.60 ml/m  RA Volume:   93.00 ml  51.23 ml/m LA Vol (A4C):   42.9 ml 23.63 ml/m LA Biplane Vol: 64.3 ml 35.42 ml/m  AORTIC VALVE AV Area (Vmax): 4.07 cm AV Vmax:        115.00 cm/s AV Peak Grad:    5.3 mmHg LVOT Vmax:      123.00 cm/s  AORTA Ao Root diam: 2.50 cm Ao Asc diam:  3.70 cm MITRAL VALVE MV Area (PHT): 3.48 cm     SHUNTS MV Decel Time: 218 msec     Systemic Diam: 2.20 cm MV E velocity: 77.50 cm/s MV A velocity: 103.00 cm/s MV E/A ratio:  0.75 Maude Emmer MD Electronically signed by Maude Emmer MD Signature Date/Time: 05/09/2024/4:22:04 PM    Final    CT Chest Wo Contrast Result Date: 05/08/2024 EXAM: CT CHEST WITHOUT CONTRAST 05/08/2024 09:55:00 PM TECHNIQUE: CT of the chest was performed without the administration of intravenous contrast. Multiplanar reformatted images are provided for review. Automated exposure control, iterative reconstruction, and/or weight based adjustment of the mA/kV was utilized to reduce the radiation dose to as low as reasonably achievable. COMPARISON: Chest radiograph earlier today and CTA chest dated 08/03/2021. CLINICAL HISTORY: Cough, chronic/persisting > 8 weeks, failed empiric treatment. FINDINGS: MEDIASTINUM: Cardiomegaly. Severe 3-vessel coronary atherosclerosis. Enlargement of the main pulmonary artery, suggesting pulmonary arterial hypertension. Mild thoracic aortic atherosclerosis. The pericardium is unremarkable. The central airways are clear. LYMPH NODES: No mediastinal, hilar or axillary lymphadenopathy. LUNGS AND PLEURA: Scattered patchy opacities in the lungs bilaterally, right lower lobe predominant, suggesting multifocal atelectasis. Superimposed  right lower lobe pneumonia is possible but not favored. Bilateral lower lobe bronchiectasis. No frank interstitial edema. No pleural effusion or pneumothorax. SOFT TISSUES/BONES: No acute abnormality of the bones or soft tissues. UPPER ABDOMEN: Limited images of the upper abdomen demonstrates no acute abnormality. IMPRESSION: 1. Scattered patchy pulmonary opacities, right lower lobe predominant, favoring multifocal atelectasis; superimposed right lower lobe pneumonia is possible but not favored. 2.  Cardiomegaly. No interstitial edema. 3. Pulmonary arterial hypertension. Electronically signed by: Pinkie Pebbles MD MD 05/08/2024 10:02 PM EST RP Workstation: [id number]   DG Chest Port 1 View Result Date: 05/08/2024 CLINICAL DATA:  Sepsis, short of breath EXAM: PORTABLE CHEST 1 VIEW COMPARISON:  02/22/2022 FINDINGS: Single frontal view of the chest demonstrates an enlarged cardiac silhouette. There is pulmonary vascular congestion with bilateral perihilar airspace disease and small right pleural effusion, consistent with congestive heart failure. No pneumothorax. No acute bony abnormalities. IMPRESSION: 1. Congestive heart failure. Electronically Signed   By: Ozell Daring [name]   On: 05/08/2024 21:13    Micro Results    Recent Results (from the past 240 hours)  Resp panel by RT-PCR (RSV, Flu A&B, Covid) Anterior Nasal Swab     Status: None   Collection Time: 05/08/24  7:56 PM   Specimen: Anterior Nasal Swab  Result Value Ref Range Status   SARS Coronavirus 2 by RT PCR NEGATIVE NEGATIVE Final   Influenza A by PCR NEGATIVE NEGATIVE Final   Influenza B by PCR NEGATIVE NEGATIVE Final    Comment: (NOTE) The Xpert Xpress SARS-CoV-2/FLU/RSV plus assay is intended as an aid in the diagnosis of influenza from Nasopharyngeal swab specimens and should not be used as a sole basis for treatment. Nasal washings and aspirates are unacceptable for Xpert Xpress SARS-CoV-2/FLU/RSV testing.  Fact Sheet for Patients: bloggercourse.com  Fact Sheet for Healthcare Providers: seriousbroker.it  This test is not yet approved or cleared by the [place]  FDA and has been authorized for detection and/or diagnosis of SARS-CoV-2 by FDA under an Emergency Use Authorization (EUA). This EUA will remain in effect (meaning this test can be used) for the duration of the COVID-19 declaration under Section 564(b)(1) of the Act, 21 U.S.C. section 360bbb-3(b)(1),  unless the authorization is terminated or revoked.     Resp Syncytial Virus by PCR NEGATIVE NEGATIVE Final    Comment: (NOTE) Fact Sheet for Patients: bloggercourse.com  Fact Sheet for Healthcare Providers: seriousbroker.it  This test is not yet approved or cleared by the [place]  FDA and has been authorized for detection and/or diagnosis of SARS-CoV-2 by FDA under an Emergency Use Authorization (EUA). This EUA will remain in effect (meaning this test can be used) for the duration of the COVID-19 declaration under Section 564(b)(1) of the Act, 21 U.S.C. section 360bbb-3(b)(1), unless the authorization is terminated or revoked.  Performed at [facility], [address], [place], [place] [address]   Blood Culture (routine x 2)     Status: None   Collection Time: 05/08/24  8:16 PM   Specimen: BLOOD RIGHT ARM  Result Value Ref Range Status   Specimen Description BLOOD RIGHT ARM  Final   Special Requests   Final    BOTTLES DRAWN AEROBIC AND ANAEROBIC Blood Culture results may not be optimal due to an inadequate volume of blood received in culture bottles   Culture   Final    NO GROWTH 5 DAYS Performed at [facility], [address] [place], [place] [address]  Report Status 05/13/2024 FINAL  Final  Blood Culture (routine x 2)     Status: None   Collection Time: 05/08/24  8:46 PM   Specimen: BLOOD RIGHT ARM  Result Value Ref Range Status   Specimen Description BLOOD RIGHT ARM  Final   Special Requests   Final    BOTTLES DRAWN AEROBIC AND ANAEROBIC Blood Culture results may not be optimal due to an inadequate volume of blood received in culture bottles   Culture   Final    NO GROWTH 5 DAYS Performed at [facility], [address], [place], [place] [address]    Report Status 05/13/2024 FINAL  Final  Urine Culture     Status: Abnormal   Collection Time: 05/08/24 10:58 PM   Specimen: Urine, Random   Result Value Ref Range Status   Specimen Description URINE, RANDOM  Final   Special Requests   Final    NONE Reflexed from [id number] Performed at [facility], [address], [place], [place] [address]    Culture (A)  Final    20,000 COLONIES/mL MULTIPLE SPECIES PRESENT, SUGGEST RECOLLECTION   Report Status 05/10/2024 FINAL  Final  MRSA Next Gen by PCR, Nasal     Status: None   Collection Time: 05/10/24  5:24 AM   Specimen: Nasal Mucosa; Nasal Swab  Result Value Ref Range Status   MRSA by PCR Next Gen NOT DETECTED NOT DETECTED Final    Comment: (NOTE) The GeneXpert MRSA Assay (FDA approved for NASAL specimens only), is one component of a comprehensive MRSA colonization surveillance program. It is not intended to diagnose MRSA infection nor to guide or monitor treatment for MRSA infections. Test performance is not FDA approved in patients less than 41 years old. Performed at [facility], [address] [place], [place] [address]     Today   Subjective    [name] today has no headache,no chest abdominal pain,no new weakness tingling or numbness, feels much better wants to go home today.    Objective   Blood pressure 120/64, pulse 61, temperature 97.8 F (36.6 C), temperature source Oral, resp. rate 15, weight 74.9 kg, SpO2 100%.   Intake/Output Summary (Last 24 hours) at 05/18/2024 0851 Last data filed at 05/17/2024 2152 Gross per 24 hour  Intake 8 ml  Output 1050 ml  Net -1042 ml    Exam  Awake Alert, No new F.N deficits,    [place].AT,PERRAL Supple Neck,   Symmetrical Chest wall movement, Good air movement bilaterally, CTAB RRR,No Gallops,   +ve B.Sounds, Abd Soft, Non tender,  No Cyanosis, Clubbing or edema    Data Review   Recent Labs  Lab 05/13/24 0331 05/14/24 0332 05/15/24 0428 05/15/24 0935 05/15/24 0937 05/16/24 0503 05/18/24 0307  WBC 9.3 10.1 11.1*  --   --  12.1* 12.3*  HGB 9.7* 10.1* 9.8* 10.9* 10.9* 9.8* 10.4*  HCT 29.8*  31.5* 30.1* 32.0* 32.0* 30.4* 31.2*  PLT 535* 630* 628*  --   --  588* 686*  MCV 90.0 89.7 88.8  --   --  89.4 87.4  MCH 29.3 28.8 28.9  --   --  28.8 29.1  MCHC 32.6 32.1 32.6  --   --  32.2 33.3  RDW 15.7* 15.9* 15.4  --   --  15.5 15.3  LYMPHSABS 1.7 1.8 1.1  --   --  1.2 2.1  MONOABS 0.9 0.9 0.5  --   --  0.6  1.1*  EOSABS 0.2 0.2 0.0  --   --  0.0 0.1  BASOSABS 0.0 0.0 0.0  --   --  0.0 0.0    Recent Labs  Lab 05/12/24 0339 05/13/24 0331 05/14/24 0332 05/15/24 0428 05/15/24 0935 05/15/24 0937 05/16/24 0503 05/17/24 0557 05/18/24 0307  NA 135 136 136 132* 138 137 131* 133* 136  K 3.3* 4.1 3.7 3.7 3.4* 3.4* 3.7 3.2* 3.9  CL 99 100 98 97*  --   --  96* 97* 102  CO2 27 28 27 25   --   --  22 25 25   ANIONGAP 10 8 11 11   --   --  13 11 10   GLUCOSE 238* 144* 150* 259*  --   --  340* 317* 184*  BUN 15 17 21  39*  --   --  42* 45* 35*  CREATININE 0.66 0.69 0.94 1.07*  --   --  0.90 0.81 0.62  CRP 2.9* 1.4* 0.8  --   --   --   --   --   --   PROCALCITON <0.10 <0.10 <0.10 <0.10  --   --   --   --   --   HGBA1C  --   --  11.5*  --   --   --   --   --   --   MG 1.7 1.9 1.7  --   --   --  1.6* 2.3 2.1  CALCIUM  8.1* 8.6* 9.0 8.7*  --   --  8.6* 8.4* 8.5*    Total Time in preparing paper work, data evaluation and todays exam - 35 minutes  Signature  -    Lavada Stank M.D on 05/18/2024 at 8:51 AM   -  To page go to [email or web address]      "

## 2024-05-18 NOTE — Progress Notes (Signed)
" ° °  Weekend notes reviewed-  Dr. Lavona felt we could follow-up as needed. He had been holding diuretics. RHC showed mild pulmonary HTN in the setting of pneumonia. Plan appears to be for d/c today. She was noted to be on torsemide  50 mg daily at home - should restart at discharge. Will arrange follow-up with Dr. Jeffrie or APP.  [facility] will sign off.   Medication Recommendations:  resume home diuretics Other recommendations (labs, testing, etc):  none Follow up as an outpatient:  Dr. Jeffrie or APP  Vinie KYM Maxcy, MD, [facility], FNLA, FACP  [place]  [facility]  Medical Director of the [facility] Lip[facility]  Cardiovascular Risk Reduction Clinic Diplomate of the American Board of Clinical Lipidology Attending Cardiologist  Direct Dial: [phone]  Fax: [phone]  Website:  [email or web address]   "

## 2024-05-18 NOTE — Plan of Care (Signed)
  Problem: Education: Goal: Knowledge of General Education information will improve Description: Including pain rating scale, medication(s)/side effects and non-pharmacologic comfort measures Outcome: Completed/Met   Problem: Health Behavior/Discharge Planning: Goal: Ability to manage health-related needs will improve Outcome: Completed/Met   Problem: Clinical Measurements: Goal: Ability to maintain clinical measurements within normal limits will improve Outcome: Completed/Met Goal: Will remain free from infection Outcome: Completed/Met Goal: Diagnostic test results will improve Outcome: Completed/Met Goal: Respiratory complications will improve Outcome: Completed/Met Goal: Cardiovascular complication will be avoided Outcome: Completed/Met   Problem: Activity: Goal: Risk for activity intolerance will decrease Outcome: Completed/Met   Problem: Nutrition: Goal: Adequate nutrition will be maintained Outcome: Completed/Met   Problem: Coping: Goal: Level of anxiety will decrease Outcome: Completed/Met   Problem: Elimination: Goal: Will not experience complications related to bowel motility Outcome: Completed/Met Goal: Will not experience complications related to urinary retention Outcome: Completed/Met   Problem: Pain Managment: Goal: General experience of comfort will improve and/or be controlled Outcome: Completed/Met   Problem: Safety: Goal: Ability to remain free from injury will improve Outcome: Completed/Met   Problem: Skin Integrity: Goal: Risk for impaired skin integrity will decrease Outcome: Completed/Met   Problem: Education: Goal: Ability to describe self-care measures that may prevent or decrease complications (Diabetes Survival Skills Education) will improve Outcome: Completed/Met Goal: Individualized Educational Video(s) Outcome: Completed/Met   Problem: Coping: Goal: Ability to adjust to condition or change in health will improve Outcome:  Completed/Met   Problem: Fluid Volume: Goal: Ability to maintain a balanced intake and output will improve Outcome: Completed/Met   Problem: Health Behavior/Discharge Planning: Goal: Ability to identify and utilize available resources and services will improve Outcome: Completed/Met Goal: Ability to manage health-related needs will improve Outcome: Completed/Met   Problem: Metabolic: Goal: Ability to maintain appropriate glucose levels will improve Outcome: Completed/Met   Problem: Nutritional: Goal: Maintenance of adequate nutrition will improve Outcome: Completed/Met Goal: Progress toward achieving an optimal weight will improve Outcome: Completed/Met   Problem: Skin Integrity: Goal: Risk for impaired skin integrity will decrease Outcome: Completed/Met   Problem: Tissue Perfusion: Goal: Adequacy of tissue perfusion will improve Outcome: Completed/Met   Problem: Education: Goal: Understanding of CV disease, CV risk reduction, and recovery process will improve Outcome: Completed/Met Goal: Individualized Educational Video(s) Outcome: Completed/Met   Problem: Activity: Goal: Ability to return to baseline activity level will improve Outcome: Completed/Met   Problem: Cardiovascular: Goal: Ability to achieve and maintain adequate cardiovascular perfusion will improve Outcome: Completed/Met Goal: Vascular access site(s) Level 0-1 will be maintained Outcome: Completed/Met   Problem: Health Behavior/Discharge Planning: Goal: Ability to safely manage health-related needs after discharge will improve Outcome: Completed/Met

## 2024-05-18 NOTE — Plan of Care (Signed)
  Problem: Clinical Measurements: Goal: Ability to maintain clinical measurements within normal limits will improve Outcome: Progressing Goal: Will remain free from infection Outcome: Progressing   Problem: Activity: Goal: Risk for activity intolerance will decrease Outcome: Progressing   Problem: Safety: Goal: Ability to remain free from injury will improve Outcome: Progressing   

## 2024-05-19 ENCOUNTER — Inpatient Hospital Stay: Admitting: Family Medicine

## 2024-05-19 ENCOUNTER — Ambulatory Visit: Admitting: Adult Health

## 2024-05-19 ENCOUNTER — Encounter: Payer: Self-pay | Admitting: Adult Health

## 2024-05-20 ENCOUNTER — Other Ambulatory Visit: Payer: Self-pay | Admitting: Pediatrics

## 2024-05-21 ENCOUNTER — Other Ambulatory Visit: Payer: Self-pay | Admitting: Family Medicine

## 2024-05-21 ENCOUNTER — Inpatient Hospital Stay: Admitting: Family Medicine

## 2024-05-21 ENCOUNTER — Other Ambulatory Visit: Payer: Self-pay

## 2024-05-21 ENCOUNTER — Telehealth: Payer: Self-pay | Admitting: Cardiology

## 2024-05-21 ENCOUNTER — Telehealth: Payer: Self-pay

## 2024-05-21 MED ORDER — BUDESONIDE 3 MG PO CPEP: 9.0000 mg | ORAL_CAPSULE | Freq: Every day | ORAL | 0 refills | Status: AC

## 2024-05-21 NOTE — Telephone Encounter (Signed)
 Copied from CRM #[id number]. Topic: Clinical - Home Health Verbal Orders >> May 21, 2024 11:34 AM Burnard DEL wrote: Caller/Agency: [facility] Callback Number: [phone] Service Requested: Physical Therapy Frequency: 1wk2 2wk3 1wk4   Any new concerns about the patient? Yes Drug interaction between promethazine  (PHENERGAN ) 25 MG tablet and potassium chloride  SA (KLOR-CON  M) 20 MEQ tablet that were prescribed by her cardiologist and gastro doctors

## 2024-05-21 NOTE — Telephone Encounter (Signed)
 Refill sent   Thank you

## 2024-05-21 NOTE — Telephone Encounter (Signed)
 Pt son calling, pt is scheduled for a hospital f/u on 2/17, but wants her seen sooner. Please advise.

## 2024-05-22 ENCOUNTER — Other Ambulatory Visit: Payer: Self-pay

## 2024-05-22 ENCOUNTER — Other Ambulatory Visit (HOSPITAL_COMMUNITY): Payer: Self-pay

## 2024-05-22 ENCOUNTER — Other Ambulatory Visit: Payer: Self-pay | Admitting: Family Medicine

## 2024-05-22 ENCOUNTER — Telehealth: Payer: Self-pay

## 2024-05-22 DIAGNOSIS — E119 Type 2 diabetes mellitus without complications: Secondary | ICD-10-CM

## 2024-05-22 MED ORDER — ACCU-CHEK GUIDE W/DEVICE KIT: 1.0000 | PACK | Freq: Every day | 0 refills | Status: AC

## 2024-05-22 NOTE — Telephone Encounter (Signed)
 Prior Authorization form/request asks a question that requires your assistance. Please see the question below and advise accordingly. The PA will not be submitted until the necessary information is received.   Please advise

## 2024-05-22 NOTE — Telephone Encounter (Signed)
 Spoke with Ismael advised that VO requested have been approved, voiced understanding

## 2024-05-22 NOTE — Telephone Encounter (Signed)
 Please okay these orders. Also reassure her that there is no interaction between Phenergan  and potassium

## 2024-05-22 NOTE — Telephone Encounter (Signed)
 Pt diabetic supplies have been sent to pt pharmacy

## 2024-05-26 ENCOUNTER — Inpatient Hospital Stay: Admitting: Adult Health

## 2024-05-28 ENCOUNTER — Ambulatory Visit

## 2024-05-28 ENCOUNTER — Encounter: Payer: Self-pay | Admitting: Family Medicine

## 2024-05-28 ENCOUNTER — Ambulatory Visit: Admitting: Family Medicine

## 2024-05-28 VITALS — BP 118/74 | HR 78 | Temp 98.5°F | Wt 204.0 lb

## 2024-05-28 DIAGNOSIS — I5033 Acute on chronic diastolic (congestive) heart failure: Secondary | ICD-10-CM

## 2024-05-28 DIAGNOSIS — J189 Pneumonia, unspecified organism: Secondary | ICD-10-CM

## 2024-05-28 DIAGNOSIS — Z794 Long term (current) use of insulin: Secondary | ICD-10-CM

## 2024-05-28 DIAGNOSIS — E119 Type 2 diabetes mellitus without complications: Secondary | ICD-10-CM | POA: Diagnosis not present

## 2024-05-28 LAB — BASIC METABOLIC PANEL WITH GFR
BUN: 12 mg/dL (ref 6–23)
CO2: 29 meq/L (ref 19–32)
Calcium: 9 mg/dL (ref 8.4–10.5)
Chloride: 106 meq/L (ref 96–112)
Creatinine, Ser: 0.49 mg/dL (ref 0.40–1.20)
GFR: 88.13 mL/min
Glucose, Bld: 144 mg/dL — ABNORMAL HIGH (ref 70–99)
Potassium: 3.3 meq/L — ABNORMAL LOW (ref 3.5–5.1)
Sodium: 142 meq/L (ref 135–145)

## 2024-05-28 LAB — MAGNESIUM: Magnesium: 1.4 mg/dL — ABNORMAL LOW (ref 1.5–2.5)

## 2024-05-28 LAB — CBC WITH DIFFERENTIAL/PLATELET
Basophils Absolute: 0 10*3/uL (ref 0.0–0.1)
Basophils Relative: 0.2 % (ref 0.0–3.0)
Eosinophils Absolute: 0.2 10*3/uL (ref 0.0–0.7)
Eosinophils Relative: 1.3 % (ref 0.0–5.0)
HCT: 34.9 % — ABNORMAL LOW (ref 36.0–46.0)
Hemoglobin: 11.3 g/dL — ABNORMAL LOW (ref 12.0–15.0)
Lymphocytes Relative: 12.7 % (ref 12.0–46.0)
Lymphs Abs: 1.9 10*3/uL (ref 0.7–4.0)
MCHC: 32.5 g/dL (ref 30.0–36.0)
MCV: 89.1 fl (ref 78.0–100.0)
Monocytes Absolute: 0.9 10*3/uL (ref 0.1–1.0)
Monocytes Relative: 6 % (ref 3.0–12.0)
Neutro Abs: 12.2 10*3/uL — ABNORMAL HIGH (ref 1.4–7.7)
Neutrophils Relative %: 79.8 % — ABNORMAL HIGH (ref 43.0–77.0)
Platelets: 434 10*3/uL — ABNORMAL HIGH (ref 150.0–400.0)
RBC: 3.91 Mil/uL (ref 3.87–5.11)
RDW: 18.3 % — ABNORMAL HIGH (ref 11.5–15.5)
WBC: 15.3 10*3/uL — ABNORMAL HIGH (ref 4.0–10.5)

## 2024-05-28 NOTE — Progress Notes (Signed)
" ° °  Subjective:    Patient ID: [name], female    DOB: 16-Oct-1940, 84 y.o.   MRN: [id number]  HPI Here with her son to follow up a hospital stay from 05-08-24 to 05-18-24 for a RLL pneumonia and acute on chronic diastolic CHF. She had been coughing a few days prior to this, and she took Levaquin  for 3 days. She continued to worsen however. She presented with SOB, dry cough, and weakness. No fever. CXR and chest CT showed a RLL pneumonia with bibasilar atalectasis. She was hypoxic with an O2 sat of 65%. She was treated with IV antibiotics, and IV Lasix . She diuresed nicely, and she was converted to oral diuretics. An ECHO showed an EF of 60-65% and normal LV motion, with Grade I diastolic dysfunction. A right heart catheterization showed pulmonary HTN but no left to right shunting. WBC was 12.2 and Hgb was 10.8. Her renal function was normal. A1c was high at 11.5%. It was felt that she had some type of inflammatory condition of her lungs as well because she showed great improvement after she was treated with IV steroids. She steadily improved, and she was able to return home on continuous supplemental oxygen  set at a flow of 2 liters. She takes Torsemide  40 mg daily. She was also sent home on Airsupra (albuterol -budesonide ) sprays BID with albuterol  per nebulizer as needed. For the type 2 diabetes she was continued on Farxiga  and Januvia , and she was started on Lantus  15 units BID along with Novolog  sliding scale insulin  TID with meals. Her glucoses have been fairly well controlled using 2 units of Novolog  TID because her glucoses stay in the range of 90-140. No doubt these have been increased by her use of steroids. She is taking Budesonide  3 mg daily for one month after her DC, and then the plan is to stop this. She feels fatigued from all this, but her appetite remains good.    Review of Systems  Constitutional:  Positive for fatigue. Negative for fever.  Respiratory: Negative.    Cardiovascular:  Negative.   Gastrointestinal: Negative.   Genitourinary: Negative.   Neurological: Negative.        Objective:   Physical Exam Constitutional:      Appearance: She is obese. She is not ill-appearing.  Cardiovascular:     Rate and Rhythm: Normal rate and regular rhythm.     Pulses: Normal pulses.     Heart sounds: Normal heart sounds.  Pulmonary:     Effort: Pulmonary effort is normal.     Breath sounds: Normal breath sounds.  Neurological:     Mental Status: She is alert.           Assessment & Plan:  She is recovering from a RLL pneumonia, and we will get a CXR today to follow up on this. Her type 2 diabetes has been stable, but we hope she can stop the steroids in a few weeks. She is scheduled to see Pulmonology on 06-03-24. Her CHF is stable, and she will see Cardiology on 06-16-24. We will get labs today including CBC, BMET and Mg. I personally spent a total of 35 minutes in the care of the patient today including getting/reviewing separately obtained history, performing a medically appropriate exam/evaluation, placing orders, and coordinating care.  Garnette Olmsted, MD   "

## 2024-05-29 ENCOUNTER — Ambulatory Visit: Payer: Self-pay | Admitting: Family Medicine

## 2024-05-29 ENCOUNTER — Telehealth: Payer: Self-pay | Admitting: Pediatrics

## 2024-05-29 NOTE — Telephone Encounter (Signed)
 Inbound call from patients son stating patient was seen by her PCP  Dr.Fry and was advised to start taking her magnesium  medication again since it was low but son stated to PCP magnesium  was taken away by Dr.McGreal so he just wanted to be advised by nurse if his mother can start them again  Requesting a call back  Please advise  Thank you

## 2024-05-29 NOTE — Telephone Encounter (Signed)
 Spoke with patient's son.  Advised Dr Andy approval to restart magnesium  supplement.  Advised Magnesium  Oxide and Magnesium  Citrate have higher  correlation for loose stools.   Recommended he contact us  if patient does not tolerate Magnesium  for advice on other forms of magnesium  to try. He voiced understanding and will call if  he has any questions or concerns arise.

## 2024-05-30 ENCOUNTER — Other Ambulatory Visit: Payer: Self-pay | Admitting: Pediatrics

## 2024-06-03 ENCOUNTER — Encounter: Payer: Self-pay | Admitting: Pulmonary Disease

## 2024-06-03 ENCOUNTER — Ambulatory Visit: Admitting: Pulmonary Disease

## 2024-06-03 VITALS — BP 144/83 | HR 81 | Ht 60.0 in | Wt 213.0 lb

## 2024-06-03 DIAGNOSIS — J9612 Chronic respiratory failure with hypercapnia: Secondary | ICD-10-CM | POA: Diagnosis not present

## 2024-06-03 DIAGNOSIS — G8929 Other chronic pain: Secondary | ICD-10-CM | POA: Diagnosis not present

## 2024-06-03 DIAGNOSIS — G4733 Obstructive sleep apnea (adult) (pediatric): Secondary | ICD-10-CM

## 2024-06-03 DIAGNOSIS — I272 Pulmonary hypertension, unspecified: Secondary | ICD-10-CM | POA: Diagnosis not present

## 2024-06-03 DIAGNOSIS — Z87891 Personal history of nicotine dependence: Secondary | ICD-10-CM | POA: Diagnosis not present

## 2024-06-03 DIAGNOSIS — M549 Dorsalgia, unspecified: Secondary | ICD-10-CM

## 2024-06-03 DIAGNOSIS — J9611 Chronic respiratory failure with hypoxia: Secondary | ICD-10-CM | POA: Diagnosis not present

## 2024-06-03 DIAGNOSIS — I509 Heart failure, unspecified: Secondary | ICD-10-CM | POA: Diagnosis not present

## 2024-06-03 NOTE — Patient Instructions (Addendum)
 We will walk you in the office today to see whether you qualify for POC  Make sure you are checking your oxygen  periodically to ascertain how much oxygen  you need You want to keep the oxygen  levels above 90%, you can adjust your oxygen  supplementation as needed to keep numbers above 90  Continue using your trilogy ventilator at night  Obtain an overnight oximetry with trilogy ventilator in place to ascertain oxygen  supplementation  Follow-up in about 2 months

## 2024-06-03 NOTE — Progress Notes (Signed)
 "              [name]    [id number]    1941/04/18  Primary Care Physician:Fry, Garnette LABOR, MD  Referring Physician: [name], MD [address] [place],  [place] [address]  Chief complaint:   Patient being seen for chronic respiratory failure, recent hospitalization for decompensated heart failure, pulmonary hypertension  HPI:  Recent hospitalization records reviewed She had a cardiac catheterization showing mild pulmonary hypertension  Underlying history of hypoxic and hypercapnic respiratory failure, she uses a nocturnal ventilator and uses a nightly  Activity level is decreased She is accompanied by a son  She tries to stay active, they have additional way that she walks regularly  Uses a walker  History of asthma, chronic back pain, history of congestive heart failure, coronary artery disease, diabetes, pulmonary hypertension  She is feeling well posthospitalization  Compliant with inhalers   Outpatient Encounter Medications as of 06/03/2024  Medication Sig   acetaminophen  (TYLENOL ) 500 MG tablet Take 500 mg by mouth every 6 (six) hours as needed for headache (pain).    AIRSUPRA 90-80 MCG/ACT AERO 2 puffs as needed Inhalation prn up to 12 puffs a day; Duration: 30 days   albuterol  (PROVENTIL ) (2.5 MG/3ML) 0.083% nebulizer solution Take 3 mLs (2.5 mg total) by nebulization every 4 (four) hours as needed for wheezing or shortness of breath.   albuterol  (VENTOLIN  HFA) 108 (90 BASE) MCG/ACT inhaler Inhale 2 puffs into the lungs every 4 (four) hours as needed for wheezing or shortness of breath.   ALPRAZolam  (XANAX ) 0.5 MG tablet TAKE 1 TABLET BY MOUTH THREE TIMES A DAY AS NEEDED   amLODipine  (NORVASC ) 10 MG tablet Take 1 tablet (10 mg total) by mouth daily.   atorvastatin  (LIPITOR) 20 MG tablet Take 1 tablet (20 mg total) by mouth daily.   azelastine (ASTELIN) 0.1 % nasal spray Place 1 spray into both nostrils at bedtime.   Blood Glucose Monitoring Suppl  (ACCU-CHEK GUIDE) w/Device KIT 1 each by Does not apply route daily at 12 noon. USE TO CHECK BLOOD GLUCOSE ONCE DAILY   budesonide  (ENTOCORT EC ) 3 MG 24 hr capsule Take 3 capsules (9 mg total) by mouth daily. Please take for one more month then discontinue.   ciclopirox  (PENLAC ) 8 % solution Apply topically at bedtime. Apply over nail. Apply daily over previous coat. Remove weekly with polish remover.   clopidogrel  (PLAVIX ) 75 MG tablet Take 1 tablet (75 mg total) by mouth daily.   colchicine  0.6 MG tablet Take 0.6 mg by mouth daily.   colestipol  (COLESTID ) 1 g tablet TAKE 2 TABLETS BY MOUTH 2 TIMES DAILY.   dapagliflozin  propanediol (FARXIGA ) 10 MG TABS tablet TAKE 1 TABLET BY MOUTH EVERY DAY   dicyclomine  (BENTYL ) 10 MG capsule TAKE 1 CAPSULE (10 MG TOTAL) BY MOUTH 3 (THREE) TIMES DAILY AS NEEDED FOR SPASMS.   diphenoxylate -atropine  (LOMOTIL ) 2.5-0.025 MG tablet Take 1 tablet by mouth in the morning and at bedtime.   fluticasone  (FLONASE ) 50 MCG/ACT nasal spray PLACE 2 SPRAYS IN EACH NOSTRIL AT BEDTIME   gabapentin  (NEURONTIN ) 300 MG capsule TAKE 1 CAPSULE BY MOUTH TWICE A DAY   guaiFENesin (MUCINEX) 600 MG 12 hr tablet Take 600 mg by mouth 2 (two) times daily as needed for to loosen phlegm or cough.   insulin  aspart (NOVOLOG ) 100 UNIT/ML FlexPen Take 5 units extra once a day at lunchtime if blood sugars are above 175.   This might be needed  while you are taking steroid taper.   This is in addition to your Lantus  and NovoLog  sliding scale.   insulin  aspart (NOVOLOG ) 100 UNIT/ML FlexPen Before each meal 3 times a day, 140-199 - 2 units, 200-250 - 6 units, 251-299 - 8 units,  300-349 - 10 units,  350 or above 14 units.   insulin  glargine (LANTUS ) 100 UNIT/ML Solostar Pen Inject 15 Units into the skin 2 (two) times daily.   Insulin  Pen Needle 32G X 4 MM MISC Use with insulin  pens.   isosorbide  mononitrate (IMDUR ) 30 MG 24 hr tablet Take 1 tablet (30 mg total) by mouth daily.   levocetirizine  (XYZAL ) 5 MG tablet Take 5 mg by mouth every evening.   loperamide (IMODIUM) 2 MG capsule Take 4-6 mg by mouth daily. Taking as needed only   losartan  (COZAAR ) 25 MG tablet Take 1 tablet (25 mg total) by mouth daily.   Magnesium  400 MG TABS Take 3 tablets by mouth daily.   mesalamine  (LIALDA ) 1.2 g EC tablet Take 2 tablets (2.4 g total) by mouth 2 (two) times daily.   metoprolol  succinate (TOPROL -XL) 100 MG 24 hr tablet Take 1 tablet (100 mg total) by mouth daily. TAKE WITH OR IMMEDIATELY FOLLOWING A MEAL.   morphine  (MS CONTIN ) 30 MG 12 hr tablet Take 1 tablet (30 mg total) by mouth every 12 (twelve) hours.   nitroGLYCERIN  (NITROSTAT ) 0.4 MG SL tablet PLACE 1 TABLET UNDER THE TONGUE EVERY 5 MINUTES AS NEEDED FOR CHEST PAIN.   Olopatadine  HCl 0.7 % SOLN Place 1 drop into both eyes daily as needed (itching/allergies).   OneTouch Delica Lancets 33G MISC 1 each by Does not apply route daily at 12 noon.   ONETOUCH VERIO test strip USE TO CHECK BLOOD GLUCOSE ONCE DAILY   Pancrelipase , Lip-Prot-Amyl, (ZENPEP ) 20000-63000 units CPEP TAKE 2 CAPSULES (40,000 UNITS) WITH MEALS AND 1 CAPSULE (20,000 UNITS) WITH A SNACK. Max daily dose of 8 capsules (160,000 units).   pantoprazole  (PROTONIX ) 40 MG tablet Take 1 tablet (40 mg total) by mouth 2 (two) times daily.   Polyethyl Glycol-Propyl Glycol (SYSTANE OP) Place 1 drop into both eyes daily. For dry eyes   potassium chloride  SA (KLOR-CON  M) 20 MEQ tablet Take 1 tablet (20 mEq total) by mouth 2 (two) times daily.   promethazine  (PHENERGAN ) 25 MG tablet TAKE 0.5 TABLETS (12.5 MG TOTAL) BY MOUTH DAILY. AS NEEDED FOR NAUSEA/ VOMITING.   sitaGLIPtin  (JANUVIA ) 100 MG tablet TAKE 1 TABLET BY MOUTH EVERY DAY   tamsulosin  (FLOMAX ) 0.4 MG CAPS capsule Take 1 capsule (0.4 mg total) by mouth daily after supper.   tiZANidine  (ZANAFLEX ) 4 MG tablet Take 4 mg by mouth 2 (two) times daily.   torsemide  (DEMADEX ) 20 MG tablet Take 2 tablets (40 mg total) by mouth daily.    Vitamin D , Ergocalciferol , (DRISDOL) 1.25 MG (50000 UNIT) CAPS capsule Take 50,000 Units by mouth every 14 (fourteen) days.   Facility-Administered Encounter Medications as of 06/03/2024  Medication   ipratropium-albuterol  (DUONEB) 0.5-2.5 (3) MG/3ML nebulizer solution 3 mL    Allergies as of 06/03/2024 - Review Complete 05/28/2024  Allergen Reaction Noted   Doxycycline  Other (See Comments) 03/12/2012   Aspirin  Nausea And Vomiting 01/25/2009   Azithromycin  Other (See Comments)    Caffeine Nausea And Vomiting 06/24/2014   Ciprofloxacin Nausea And Vomiting    Codeine Nausea And Vomiting    Oxycodone  Other (See Comments) 11/19/2017   Sulfamethizole Other (See Comments) 03/12/2012   Tramadol hcl  Other (See Comments)     Past Medical History:  Diagnosis Date   Adenomatous colon polyp 02/2006   Allergy    Allergy, unspecified not elsewhere classified    Anemia    Anxiety    Arthritis    Asthma    Blood transfusion without reported diagnosis    Bronchiectasis    CAD (coronary artery disease)    BMS to the LAD, 2002; cath 12/07/11 patent LAD stent and mild nonobstructive disease, EF 65%; Medical management   Cataract    removed both eyes   CHF (congestive heart failure) (HCC)    Diastolic   Chronic diastolic CHF (congestive heart failure) (HCC) 11/25/2011   Clotting disorder    in legs per pt    Diabetes mellitus    Diverticulosis of colon    DJD (degenerative joint disease)    Fibromyalgia    GERD (gastroesophageal reflux disease)    H. pylori infection 05/12/2019   Hypercholesterolemia    Hypertension    Microscopic hematuria    Neoplasm of kidney    Obesity    Other diseases of lung, not elsewhere classified    Pinched vertebral nerve    Pulmonary nodule    Negative PET in 2010   Stress incontinence, female    Syncope    Ulcerative colitis, left sided (HCC) 2008   Hx of   UTI (lower urinary tract infection)    Vitamin D  deficiency disease     Past Surgical  History:  Procedure Laterality Date   ABDOMINAL HYSTERECTOMY     CARDIAC CATHETERIZATION  11/2011   CATARACT EXTRACTION, BILATERAL  2020   CHOLECYSTECTOMY     COLONOSCOPY  01/09/2018   per Dr. Aneita, adenomatous polyps, repeat in 3 yrs   CORONARY STENT PLACEMENT  2002   BMS to the LAD   ESOPHAGOGASTRODUODENOSCOPY  12/2008   HAND SURGERY  01/2006   Right Hand Surgery by Dr. Camella   LEFT HEART CATHETERIZATION WITH CORONARY ANGIOGRAM N/A 12/07/2011   Procedure: LEFT HEART CATHETERIZATION WITH CORONARY ANGIOGRAM;  Surgeon: Peter M Jordan, MD;  Location: [facility] CATH LAB;  Service: Cardiovascular;  Laterality: N/A;   RIGHT HEART CATH N/A 05/15/2024   Procedure: RIGHT HEART CATH;  Surgeon: Rolan Ezra RAMAN, MD;  Location: [facility] INVASIVE CV LAB;  Service: Cardiovascular;  Laterality: N/A;   RIGHT/LEFT HEART CATH AND CORONARY ANGIOGRAPHY N/A 08/07/2021   Procedure: RIGHT/LEFT HEART CATH AND CORONARY ANGIOGRAPHY;  Surgeon: Cherrie Toribio SAUNDERS, MD;  Location: [facility] INVASIVE CV LAB;  Service: Cardiovascular;  Laterality: N/A;   UPPER GASTROINTESTINAL ENDOSCOPY      Family History  Problem Relation Age of Onset   Pneumonia Father    Heart attack Father    Colon polyps Father    Parkinsonism Mother    Diabetes Brother    Sarcoidosis Brother    Hypertension Sister    Diabetes Sister    Breast cancer Daughter    Colon cancer Neg Hx    Esophageal cancer Neg Hx    Rectal cancer Neg Hx    Stomach cancer Neg Hx     Social History   Socioeconomic History   Marital status: Divorced    Spouse name: Not on file   Number of children: 3   Years of education: Not on file   Highest education level: Not on file  Occupational History   Occupation: Retired    Associate Professor: RETIRED  Tobacco Use   Smoking status: Former    Current packs/day:  0.00    Average packs/day: 2.0 packs/day for 15.0 years (30.0 ttl pk-yrs)    Types: Cigarettes    Start date: 04/30/1958    Quit date: 04/30/1973    Years since quitting:  51.1    Passive exposure: Past   Smokeless tobacco: Never   Tobacco comments:    ex-smoker: smoked for 10-15 years up to 2ppd, quit 1975.  Vaping Use   Vaping status: Never Used  Substance and Sexual Activity   Alcohol  use: Not Currently    Alcohol /week: 0.0 standard drinks of alcohol     Comment: none now    Drug use: No   Sexual activity: Not Currently  Other Topics Concern   Not on file  Social History Narrative   Right handed   Social Drivers of Health   Tobacco Use: Medium Risk (05/28/2024)   Patient History    Smoking Tobacco Use: Former    Smokeless Tobacco Use: Never    Passive Exposure: Past  Physicist, Medical Strain: Low Risk (05/31/2023)   Overall Financial Resource Strain (CARDIA)    Difficulty of Paying Living Expenses: Not hard at all  Food Insecurity: No Food Insecurity (05/09/2024)   Epic    Worried About Programme Researcher, Broadcasting/film/video in the Last Year: Never true    Ran Out of Food in the Last Year: Never true  Transportation Needs: No Transportation Needs (05/09/2024)   Epic    Lack of Transportation (Medical): No    Lack of Transportation (Non-Medical): No  Physical Activity: Inactive (05/31/2023)   Exercise Vital Sign    Days of Exercise per Week: 0 days    Minutes of Exercise per Session: 0 min  Stress: No Stress Concern Present (05/31/2023)   [employer] of Occupational Health - Occupational Stress Questionnaire    Feeling of Stress : Not at all  Social Connections: Moderately Integrated (05/09/2024)   Social Connection and Isolation Panel    Frequency of Communication with Friends and Family: Three times a week    Frequency of Social Gatherings with Friends and Family: Three times a week    Attends Religious Services: More than 4 times per year    Active Member of Clubs or Organizations: Yes    Attends Banker Meetings: More than 4 times per year    Marital Status: Widowed  Intimate Partner Violence: Not At Risk (05/09/2024)   Epic    Fear  of Current or Ex-Partner: No    Emotionally Abused: No    Physically Abused: No    Sexually Abused: No  Depression (PHQ2-9): Low Risk (05/31/2023)   Depression (PHQ2-9)    PHQ-2 Score: 0  Alcohol  Screen: Low Risk (05/31/2023)   Alcohol  Screen    Last Alcohol  Screening Score (AUDIT): 0  Housing: Low Risk (05/09/2024)   Epic    Unable to Pay for Housing in the Last Year: No    Number of Times Moved in the Last Year: 0    Homeless in the Last Year: No  Utilities: Not At Risk (05/09/2024)   Epic    Threatened with loss of utilities: No  Health Literacy: Adequate Health Literacy (05/31/2023)   B1300 Health Literacy    Frequency of need for help with medical instructions: Never    Review of Systems  Respiratory:  Positive for apnea and shortness of breath.   Psychiatric/Behavioral:  Positive for sleep disturbance.     There were no vitals filed for this visit.    Physical Exam Constitutional:  Appearance: She is obese.  HENT:     Head: Normocephalic.     Mouth/Throat:     Mouth: Mucous membranes are moist.  Eyes:     General: No scleral icterus.    Pupils: Pupils are equal, round, and reactive to light.  Cardiovascular:     Rate and Rhythm: Normal rate and regular rhythm.     Heart sounds: No murmur heard.    No friction rub.  Pulmonary:     Effort: No respiratory distress.     Breath sounds: No stridor. No wheezing or rhonchi.  Musculoskeletal:     Cervical back: No rigidity or tenderness.     Right lower leg: Edema present.     Left lower leg: Edema present.  Skin:    General: Skin is warm.  Neurological:     General: No focal deficit present.     Mental Status: She is alert.  Psychiatric:        Mood and Affect: Mood normal.    Data Reviewed: Dawayne is not available  Previous PFT reviewed from April 2023 showing restriction  Recent hospital records reviewed  Recent right heart catheterization reviewed showing mild pulmonary  hypertension  Assessment:  Chronic hypoxic/hypercapnic respiratory failure - On nocturnal ventilator with oxygen  supplementation through device  Chronic hypoxic respiratory failure - Needs oxygen  supplementation during the day  Was ambulated in the office today and required 3 L to keep saturations in the 90s  History of obstructive sleep apnea - Continue nocturnal ventilator use  History of heart failure with improved ejection fraction, history of cor pulmonale, diastolic dysfunction  Pulmonary hypertension Likely related to chronic hypoxemia/underlying lung disease/diastolic dysfunction - Group 2 and 3  Multifactorial shortness of breath  Deconditioning  Chronic back pain  Plan/Recommendations: Will obtain nocturnal oximetry with trilogy ventilator in place  Encourage graded activity as tolerated  Ambulated in the office today - Will place order for oxygen  supplementation, POC with activity  Encouraged to call with significant concerns  Continue cautious diuresis  Follow-up in about 2 months  I personally spent a total of 35 minutes in the care of the patient today including preparing to see the patient, getting/reviewing separately obtained history, performing a medically appropriate exam/evaluation, counseling and educating, placing orders, documenting clinical information in the EHR, and independently interpreting results.   Jennet Epley MD [facility] 06/03/2024, 10:32 AM  CC: [name], MD  SATURATION QUALIFICATIONS: (This note is used to comply with regulatory documentation for home oxygen )   Patient Saturations on Room Air at Rest = 86%   Patient Saturations on Room Air while Ambulating = 81%   Patient Saturations on 3 Liters of oxygen  while Ambulating = 93% vis POC - 06/03/24    Please briefly explain why patient needs home oxygen :Patient sats were already low few steops in was placed on POC - 3L sats @ 93% was able to  maintain in 90's  "

## 2024-06-04 ENCOUNTER — Telehealth: Payer: Self-pay | Admitting: Pulmonary Disease

## 2024-06-04 NOTE — Telephone Encounter (Signed)
 Called and spoke with the pts son (DPR) and advised of note's from [employer] [facility]. Pts son is going to contact them to get this handled.  Will call back once this is done.  NFN as of now.

## 2024-06-04 NOTE — Telephone Encounter (Signed)
 Per Dolanda at [employer]-  Per management we cannot provide any equipment or test until they contact [facility] and set up payment arrangements [phone] For the ONO as well as O2

## 2024-06-05 ENCOUNTER — Telehealth: Payer: Self-pay

## 2024-06-05 ENCOUNTER — Ambulatory Visit: Payer: PPO

## 2024-06-05 ENCOUNTER — Other Ambulatory Visit: Payer: Self-pay

## 2024-06-05 MED ORDER — INSULIN GLARGINE 100 UNIT/ML SOLOSTAR PEN: 15.0000 [IU] | PEN_INJECTOR | Freq: Two times a day (BID) | SUBCUTANEOUS | 0 refills | Status: AC

## 2024-06-05 MED ORDER — ACCU-CHEK GUIDE TEST VI STRP: ORAL_STRIP | 3 refills | Status: AC

## 2024-06-05 MED ORDER — INSULIN PEN NEEDLE 32G X 4 MM MISC: 0 refills | Status: AC

## 2024-06-05 MED ORDER — INSULIN ASPART 100 UNIT/ML FLEXPEN: PEN_INJECTOR | SUBCUTANEOUS | 0 refills | Status: AC

## 2024-06-05 NOTE — Telephone Encounter (Signed)
 Copied from CRM [id number]. Topic: Clinical - Prescription Issue >> Jun 05, 2024  1:43 PM Ahlexyia S wrote: Reason for CRM: Pt son called in stating that pt is supposed to check her blood sugar but they dont have the test strips for it. Elsie stated that the pharmacy only had the strips. Trudy is requesting a callback.

## 2024-06-05 NOTE — Telephone Encounter (Signed)
 Done

## 2024-06-05 NOTE — Telephone Encounter (Signed)
 Copied from CRM [id number]. Topic: Clinical - Medication Refill >> Jun 05, 2024  1:51 PM Ahlexyia S wrote: Medication:  insulin  glargine (LANTUS ) 100 UNIT/ML Solostar Pen insulin  aspart (NOVOLOG ) 100 UNIT/ML FlexPen  Has the patient contacted their pharmacy? No, pt was prescribed this at the hospital. Elsie is requesting a callback when this has been sent to the pharmacy. (Agent: If no, request that the patient contact the pharmacy for the refill. If patient does not wish to contact the pharmacy document the reason why and proceed with request.) (Agent: If yes, when and what did the pharmacy advise?)  This is the patient's preferred pharmacy:  [id number] [address] [place], [place] - [address] [place] [address] Phone: [phone] Fax: [phone]  Is this the correct pharmacy for this prescription? Yes If no, delete pharmacy and type the correct one.   Has the prescription been filled recently? No  Is the patient out of the medication? Very limited supply  Has the patient been seen for an appointment in the last year OR does the patient have an upcoming appointment? Yes  Can we respond through MyChart? No, phone calls.  Agent: Please be advised that Rx refills may take up to 3 business days. We ask that you follow-up with your pharmacy.

## 2024-06-05 NOTE — Telephone Encounter (Signed)
 Spoke with pt son, advised that prescriptions have been sent to pt pharmacy, voiced understanding

## 2024-06-05 NOTE — Telephone Encounter (Signed)
 Copied from CRM #[id number]. Topic: Clinical - Order For Equipment >> Jun 04, 2024  4:37 PM Leila C wrote: Reason for CRM: Patient's son Elsie [phone] wants to speak with Dr. Cathye nurse Marcus, regarding [employer] [facility] information, oxygen  portable concentrator and [employer]. Unable to reach CAL, please call back.   Spoke with patient son okay per DPR - every has been fixed according to [employer] sent message to [facility] to make sure order has went through

## 2024-06-16 ENCOUNTER — Ambulatory Visit: Admitting: General Practice

## 2024-07-28 ENCOUNTER — Other Ambulatory Visit

## 2024-08-18 ENCOUNTER — Ambulatory Visit: Admitting: Pulmonary Disease
